# Patient Record
Sex: Female | Born: 1940 | Race: White | Hispanic: No | Marital: Married | Smoking: Never smoker
Health system: Southern US, Community
[De-identification: ages and names within clinical notes are randomized; demographics above are authoritative.]

## PROBLEM LIST (undated history)

## (undated) DIAGNOSIS — F329 Major depressive disorder, single episode, unspecified: Secondary | ICD-10-CM

## (undated) DIAGNOSIS — Z803 Family history of malignant neoplasm of breast: Secondary | ICD-10-CM

## (undated) DIAGNOSIS — M1990 Unspecified osteoarthritis, unspecified site: Secondary | ICD-10-CM

## (undated) DIAGNOSIS — I2510 Atherosclerotic heart disease of native coronary artery without angina pectoris: Secondary | ICD-10-CM

## (undated) DIAGNOSIS — R531 Weakness: Secondary | ICD-10-CM

## (undated) DIAGNOSIS — Z9889 Other specified postprocedural states: Secondary | ICD-10-CM

## (undated) DIAGNOSIS — I639 Cerebral infarction, unspecified: Secondary | ICD-10-CM

## (undated) DIAGNOSIS — R112 Nausea with vomiting, unspecified: Secondary | ICD-10-CM

## (undated) DIAGNOSIS — F32A Depression, unspecified: Secondary | ICD-10-CM

## (undated) DIAGNOSIS — I509 Heart failure, unspecified: Secondary | ICD-10-CM

## (undated) DIAGNOSIS — T4145XA Adverse effect of unspecified anesthetic, initial encounter: Secondary | ICD-10-CM

## (undated) DIAGNOSIS — G4730 Sleep apnea, unspecified: Secondary | ICD-10-CM

## (undated) DIAGNOSIS — R51 Headache: Secondary | ICD-10-CM

## (undated) DIAGNOSIS — K229 Disease of esophagus, unspecified: Secondary | ICD-10-CM

## (undated) DIAGNOSIS — Z8719 Personal history of other diseases of the digestive system: Secondary | ICD-10-CM

## (undated) DIAGNOSIS — E7800 Pure hypercholesterolemia, unspecified: Secondary | ICD-10-CM

## (undated) DIAGNOSIS — I4891 Unspecified atrial fibrillation: Secondary | ICD-10-CM

## (undated) DIAGNOSIS — G43909 Migraine, unspecified, not intractable, without status migrainosus: Secondary | ICD-10-CM

## (undated) DIAGNOSIS — E785 Hyperlipidemia, unspecified: Secondary | ICD-10-CM

## (undated) DIAGNOSIS — I10 Essential (primary) hypertension: Secondary | ICD-10-CM

## (undated) DIAGNOSIS — M797 Fibromyalgia: Secondary | ICD-10-CM

## (undated) DIAGNOSIS — I209 Angina pectoris, unspecified: Secondary | ICD-10-CM

## (undated) DIAGNOSIS — C801 Malignant (primary) neoplasm, unspecified: Secondary | ICD-10-CM

## (undated) DIAGNOSIS — K219 Gastro-esophageal reflux disease without esophagitis: Secondary | ICD-10-CM

## (undated) DIAGNOSIS — T8859XA Other complications of anesthesia, initial encounter: Secondary | ICD-10-CM

## (undated) DIAGNOSIS — K224 Dyskinesia of esophagus: Secondary | ICD-10-CM

## (undated) DIAGNOSIS — D229 Melanocytic nevi, unspecified: Secondary | ICD-10-CM

## (undated) DIAGNOSIS — C439 Malignant melanoma of skin, unspecified: Secondary | ICD-10-CM

## (undated) DIAGNOSIS — IMO0002 Reserved for concepts with insufficient information to code with codable children: Secondary | ICD-10-CM

## (undated) DIAGNOSIS — Z860100 Personal history of colon polyps, unspecified: Secondary | ICD-10-CM

## (undated) DIAGNOSIS — G2581 Restless legs syndrome: Secondary | ICD-10-CM

## (undated) DIAGNOSIS — M5430 Sciatica, unspecified side: Secondary | ICD-10-CM

## (undated) DIAGNOSIS — Z800 Family history of malignant neoplasm of digestive organs: Secondary | ICD-10-CM

## (undated) HISTORY — DX: Family history of malignant neoplasm of digestive organs: Z80.0

## (undated) HISTORY — DX: Personal history of colon polyps, unspecified: Z86.0100

## (undated) HISTORY — PX: DILATION AND CURETTAGE OF UTERUS: SHX78

## (undated) HISTORY — DX: Family history of malignant neoplasm of breast: Z80.3

## (undated) HISTORY — PX: NASAL SEPTUM SURGERY: SHX37

## (undated) HISTORY — PX: UPPER GASTROINTESTINAL ENDOSCOPY: SHX188

## (undated) HISTORY — PX: CATARACT EXTRACTION, BILATERAL: SHX1313

## (undated) HISTORY — DX: Melanocytic nevi, unspecified: D22.9

## (undated) HISTORY — DX: Personal history of colonic polyps: Z86.010

## (undated) HISTORY — DX: Disease of esophagus, unspecified: K22.9

## (undated) HISTORY — DX: Dyskinesia of esophagus: K22.4

## (undated) HISTORY — DX: Malignant melanoma of skin, unspecified: C43.9

## (undated) HISTORY — PX: FRACTURE SURGERY: SHX138

---
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
## 2002-11-13 HISTORY — PX: MOUTH SURGERY: SHX715

## 2003-11-14 HISTORY — PX: CORONARY ARTERY BYPASS GRAFT: SHX141

## 2006-07-25 ENCOUNTER — Ambulatory Visit: Payer: Self-pay | Admitting: Family Medicine

## 2007-11-14 ENCOUNTER — Emergency Department (HOSPITAL_COMMUNITY): Admission: EM | Admit: 2007-11-14 | Discharge: 2007-11-14 | Payer: Self-pay | Admitting: Emergency Medicine

## 2007-11-27 ENCOUNTER — Ambulatory Visit: Payer: Self-pay | Admitting: Family Medicine

## 2008-03-17 ENCOUNTER — Ambulatory Visit: Payer: Self-pay | Admitting: Family Medicine

## 2009-01-27 ENCOUNTER — Ambulatory Visit: Payer: Self-pay | Admitting: Family Medicine

## 2009-02-22 ENCOUNTER — Encounter: Payer: Self-pay | Admitting: Family Medicine

## 2009-03-13 ENCOUNTER — Encounter: Payer: Self-pay | Admitting: Family Medicine

## 2009-04-13 ENCOUNTER — Encounter: Payer: Self-pay | Admitting: Family Medicine

## 2010-03-09 ENCOUNTER — Ambulatory Visit: Payer: Self-pay

## 2011-03-02 ENCOUNTER — Inpatient Hospital Stay (HOSPITAL_COMMUNITY)
Admission: RE | Admit: 2011-03-02 | Discharge: 2011-03-02 | Disposition: A | Discharge status: Home / Self Care | Payer: Medicare Other | Source: Ambulatory Visit | Attending: Cardiology | Admitting: Cardiology

## 2011-03-02 ENCOUNTER — Ambulatory Visit (HOSPITAL_COMMUNITY)
Admission: RE | Admit: 2011-03-02 | Discharge: 2011-03-02 | Disposition: A | Discharge status: Home / Self Care | Payer: Medicare Other | Source: Ambulatory Visit | Attending: Cardiology | Admitting: Cardiology

## 2011-03-02 DIAGNOSIS — I25810 Atherosclerosis of coronary artery bypass graft(s) without angina pectoris: Secondary | ICD-10-CM | POA: Insufficient documentation

## 2011-03-02 DIAGNOSIS — Z01810 Encounter for preprocedural cardiovascular examination: Secondary | ICD-10-CM | POA: Insufficient documentation

## 2011-03-02 DIAGNOSIS — I2510 Atherosclerotic heart disease of native coronary artery without angina pectoris: Secondary | ICD-10-CM | POA: Insufficient documentation

## 2011-03-02 DIAGNOSIS — I200 Unstable angina: Secondary | ICD-10-CM | POA: Insufficient documentation

## 2011-03-02 LAB — GLUCOSE, CAPILLARY: Glucose-Capillary: 137 mg/dL — ABNORMAL HIGH (ref 70–99)

## 2011-03-04 NOTE — Cardiovascular Report (Signed)
Morgan [name], Morgan [name]             ACCOUNT NO.:  [id number]  MEDICAL RECORD NO.:  [id number]           PATIENT TYPE:  O  LOCATION:  CATH                         FACILITY:  [facility]  PHYSICIAN:  Jake Bathe, MD      DATE OF BIRTH:  Nov 01, 1941  DATE OF PROCEDURE:  03/02/2011 DATE OF DISCHARGE:                           CARDIAC CATHETERIZATION   PRIMARY CARE PHYSICIAN:  Dr. Clarice Pole in [place], [place].  CARDIOTHORACIC SURGEON:  Dr. Pat Patrick at [facility] in [place].  INDICATIONS:  A 70 year old female with coronary artery disease status post bypass x2, LIMA to LAD, and SVG to OM in 2006 by Dr. Pat Patrick.  He has been having progressive anginal symptoms and has failed both Ranexa as well as isosorbide.  A nuclear stress test was performed, which demonstrated mild reversibility in the distal anterior wall segment with overall normal ejection fraction.  PROCEDURE DETAILS:  Informed consent was obtained.  Risk of stroke, heart attack, death, renal impairment, bleeding, arterial damage were explained to the patient at length.  Due to her BUN of 41 and creatinine of 1.35, she was brought in early for bicarbonate protocol.  She tolerated this well.  Fluoroscopy of the femoral head was obtained.  1% lidocaine was used for local anesthesia and a 4-French sheath was inserted in the right femoral artery without difficulty.  A Judkins left #4 catheter was used to selectively cannulate the left main artery and multiple views of hand injection of Omnipaque were obtained.  This catheter was then exchanged for a no torque Williams right catheter, which was used to selectively cannulate the right coronary artery and multiple views with hand injection of Omnipaque were obtained.  This catheter was then utilized to selectively cannulate the SVG graft and a view with Omnipaque was obtained.  The left subclavian artery was then selectively cannulated  and a wire was used to traverse past the LIMA. The no torque Williams right did not selectively cannulate the LIMA well enough and therefore this was exchanged over an exchange length wire for a LIMA catheter, which was appropriately selective.  Two views with hand injection of Omnipaque were then obtained.  An angled pigtail was used to then cross the aortic valve and hemodynamics were obtained.  Due to contrast-sparing hand injection, left ventriculogram was performed with approximately 5 mL of contrast.  The left ventricle was underfilled. Recent echocardiogram evidence was normal ejection fraction.  FINDINGS: 1. Left main artery widely patent giving rise to both the circumflex     and the LAD.  No angiographically significant disease. 2. Left anterior descending artery - the proximal portion of this     vessel remains widely patent.  There is a small caliber diagonal     branch, which is possibly a 2-mm vessel, which demonstrates a 95%     stenosis encompassing approximately 10 mm in length.  This is     likely the culprit vessel for the nuclear stress test abnormality.     This vessel was present previously and was not bypassed due to the     small  caliber nature of the vessel.  The remainder of the LAD after     this diagonal branch is quite small in caliber and the LIMA to LAD     graft in the mid segment remains widely patent providing     competitive flow down the vessel.  After the bifurcation of the     first diagonal branch with the noted stenosis, the LAD narrows to     approximately 50%.  The proximal vessel is calcified. 3. Circumflex artery - compared to the LAD distribution distally this     is a moderate-sized vessel providing 2 obtuse marginal branches.     Just prior to the first obtuse marginal branch, there is     approximately 30% stenosis and there is also a 30% stenosis between     the first and second obtuse marginal branch in the AV groove     circumflex. 4.  Right coronary artery - there is a mid 30% focal stenosis.  This     vessel gives rise to the posterior descending artery and therefore     is dominant.  There are 2 posterolateral branches.  Bypass grafts,     the SVG to obtuse marginal is occluded proximally.  The LIMA to the     LAD is a small-caliber vessel matching the distal portion after the     anastomosis site in the LAD. 5. Hemodynamics.  Aortic pressure 148/62 with a mean of 95.  Left     ventricular pressure 148 with an end-diastolic pressure of 20 mmHg.     There is no gradient during pullback.  Hand injection of Omnipaque     underfilled the left ventricle during left ventriculogram.  Recent     echocardiogram does demonstrate normal ejection fraction.  IMPRESSION: 1. Occluded saphenous vein graft to obtuse marginal. 2. Widely patent left internal mammary artery to left anterior     descending graft, although small in caliber. 3. 85% to 90% stenosis in the first diagonal branch likely     contributing to her anginal symptoms as well as nuclear stress test     abnormality. 4. Minor 30% stenosis in the obtuse marginal branch sequential as well     as the mid right coronary artery. 5. Left ventricular ejection fraction by echocardiogram recently was     normal at 60%.  Underfilled hand injection during left     ventriculography.  PLAN:  Findings have been discussed with Dr. Everette Rank of Interventional Cardiology.  Given her refractory symptoms, next week he will attempt percutaneous intervention to this diagonal vessel.  At the least, a cutting balloon procedure will be performed and if the vessel appears large enough perhaps a small-caliber stent will be able to be placed.  She does note that she has trigger finger bilaterally and was considering trigger finger release surgery in the future.  She does note that this could be postponed however.     Jake Bathe, MD     MCS/MEDQ  D:  03/02/2011  T:  03/03/2011   Job:  [id number]  cc:   Clarice Pole, MD Pat Patrick, MD  Electronically Signed by Donato Schultz MD on 03/04/2011 06:40:29 AM

## 2011-03-08 ENCOUNTER — Ambulatory Visit (HOSPITAL_COMMUNITY)
Admission: RE | Admit: 2011-03-08 | Discharge: 2011-03-09 | Disposition: A | Discharge status: Home / Self Care | Payer: Medicare Other | Source: Ambulatory Visit | Attending: Interventional Cardiology | Admitting: Interventional Cardiology

## 2011-03-08 DIAGNOSIS — I2510 Atherosclerotic heart disease of native coronary artery without angina pectoris: Secondary | ICD-10-CM | POA: Insufficient documentation

## 2011-03-08 DIAGNOSIS — I209 Angina pectoris, unspecified: Secondary | ICD-10-CM | POA: Insufficient documentation

## 2011-03-08 DIAGNOSIS — Z951 Presence of aortocoronary bypass graft: Secondary | ICD-10-CM | POA: Insufficient documentation

## 2011-03-08 DIAGNOSIS — E785 Hyperlipidemia, unspecified: Secondary | ICD-10-CM | POA: Insufficient documentation

## 2011-03-08 LAB — GLUCOSE, CAPILLARY: Glucose-Capillary: 138 mg/dL — ABNORMAL HIGH (ref 70–99)

## 2011-03-08 LAB — POCT ACTIVATED CLOTTING TIME: Activated Clotting Time: 370 seconds

## 2011-03-09 LAB — BASIC METABOLIC PANEL
CO2: 30 mEq/L (ref 19–32)
Calcium: 9.4 mg/dL (ref 8.4–10.5)
Creatinine, Ser: 0.97 mg/dL (ref 0.4–1.2)
Sodium: 146 mEq/L — ABNORMAL HIGH (ref 135–145)

## 2011-03-09 LAB — CBC
MCH: 28.9 pg (ref 26.0–34.0)
MCV: 89.8 fL (ref 78.0–100.0)
Platelets: 247 10*3/uL (ref 150–400)
RBC: 3.43 MIL/uL — ABNORMAL LOW (ref 3.87–5.11)
RDW: 13.4 % (ref 11.5–15.5)
WBC: 6.6 10*3/uL (ref 4.0–10.5)

## 2011-03-16 NOTE — Cardiovascular Report (Signed)
  NAMEMERRELL, RETTINGER             ACCOUNT NO.:  [id number]  MEDICAL RECORD NO.:  [id number]           PATIENT TYPE:  O  LOCATION:  6524                         FACILITY:  [facility]  PHYSICIAN:  Corky Crafts, MDDATE OF BIRTH:  Apr 07, 1941  DATE OF PROCEDURE:  03/08/2011 DATE OF DISCHARGE:                           CARDIAC CATHETERIZATION   PRIMARY CARDIOLOGIST:  Loraine Leriche C. Anne Fu, MD  PROCEDURE PERFORMED:  PCI of the first diagonal.  OPERATOR:  Corky Crafts, MD  INDICATIONS:  Stable angina.  PROCEDURE NARRATIVE:  The risks and benefits of cardiac cath were explained to the patient.  Informed consent was obtained.  She was brought to the cath lab.  She was prepped and draped in usual sterile fashion.  Her right wrist was infiltrated with 1% lidocaine.  A 6-French glide sheath was placed into the right radial artery using the modified Seldinger technique.  Verapamil was used in the radial artery to prevent spasm.  A CLS 3.0 guiding catheter was advanced to the ascending aorta and used to engage the left main.  Angiomax was used for anticoagulation.  An ACT was withdrawn to document therapeutic effect. Prowater wire was placed across the lesion.  A 1.5 x 15 balloon was used to attempt to predilate the lesion, inflate it to 10 atmospheres; however, there was no significant change.  Subsequently, a 2.0 x 15 MINI TREK was inflated up to 18 atmospheres.  A waist in the mid lesion remained.  Subsequently, a 2.0 x 12 Perry TREK was used and successfully predilated the lesion in the first diagonal.  This had been documented by diagnostic cath by Dr. Anne Fu.  There was a 90% moderate length lesion.  After adequate predilatation, a 2.25 x 18 Resolute stent was deployed at 10 atmospheres for 22 seconds, it was postdilated with 2.25 x 80 [place] TREK balloon inflated to 12 atmospheres and then to 14 atmospheres.  There was no residual stenosis.  TIMI 3 flow was maintained.  IMPRESSION:   Multiple doses of intracoronary nitroglycerin were given to try and dilate the vessel, which was successful.  There was some evidence of spasm proximal to the stent when compared to the initial angiogram.  This was likely due to manipulation with the balloons, wires, and stent.  IMPRESSION:  Successful percutaneous coronary intervention of the first diagonal with a small drug-eluting stent.  Spasm noted in the proximal diagonal as noted above.  RECOMMENDATIONS:  Continue aspirin and Plavix for at least a year. Continue aggressive secondary prevention.     Corky Crafts, MD     JSV/MEDQ  D:  03/08/2011  T:  03/08/2011  Job:  [id number]  cc:   Jake Bathe, MD Ladene Artist, M.D.  Electronically Signed by Lance Muss MD on 03/16/2011 01:32:57 PM

## 2011-03-23 NOTE — Discharge Summary (Signed)
  [name], [name]             ACCOUNT NO.:  [id number]  MEDICAL RECORD NO.:  [id number]           PATIENT TYPE:  O  LOCATION:  6524                         FACILITY:  [facility]  PHYSICIAN:  Corky Crafts, MDDATE OF BIRTH:  [birth year]  DATE OF ADMISSION:  03/08/2011 DATE OF DISCHARGE:  03/09/2011                              DISCHARGE SUMMARY   DISCHARGE DIAGNOSES: 1. Coronary artery disease. 2. Hyperlipidemia.  PROCEDURES PERFORMED:  Cardiac catheterization with drug-eluting stent placement to the first diagonal.  HOSPITAL COURSE:  The patient underwent cardiac catheterization from the right radial approach and had a drug-eluting stent placed in her first diagonal.  This vessel has been blocked for some time but was too small to be bypassed when she had bypass surgery several years ago.  She had an abnormal stress test and persistent angina.  She had been intolerant to the multiple antianginals.  She tolerated the procedure well.  Her hemoglobin was 9.9 on the day after the procedure.  Creatinine prior to the procedure was 10.9.  She was aggressively hydrated postprocedure because of some prior renal insufficiency.  She had a short run of nonsustained ventricular tachycardia but was asymptomatic.  Her electrolytes were normal.  She had no significant chest discomfort when walking with Cardiac Rehab.  She was deemed ready for discharge the following day.  Her right radial pulse was intact.  ACTIVITY:  Increase activity slowly.  Follow post-radial cath instructions.  DIET:  Low-sodium, heart-healthy diet.  FOLLOWUP APPOINTMENT:  With Dr. Anne Fu on Mar 23, 2011, at 9:15 a.m.  DISCHARGE MEDICATIONS: 1. Tylenol 650 mg p.o. q.4 hours p.r.n. 2. Protonix 40 mg daily. 3. Aspirin 81 mg daily. 4. Calcium with vitamin D. 5. Clonazepam 1-1/2 tablet at bedtime. 6. Cozaar 100 mg at bedtime. 7. Effexor 75 mg daily. 8. HCTZ 25 mg daily. 9. Lipitor 80 mg  daily. 10.Ocuvite. 11.Fish oil. 12.Plavix 75 mg daily. 13.Toprol-XL 100 mg at bedtime. 14.Vitamin D. 15.She is to stop taking Nexium and diclofenac because of the possible     interactions with Plavix.     Corky Crafts, MD     JSV/MEDQ  D:  03/09/2011  T:  03/09/2011  Job:  [id number]  cc:   Jake Bathe, MD Shella Spearing, MD Yisroel Ramming, MD  Electronically Signed by Lance Muss MD on 03/23/2011 12:53:10 PM

## 2011-06-14 HISTORY — PX: CORONARY ANGIOPLASTY WITH STENT PLACEMENT: SHX49

## 2011-09-27 ENCOUNTER — Encounter: Payer: Self-pay | Admitting: *Deleted

## 2011-09-27 ENCOUNTER — Observation Stay (HOSPITAL_COMMUNITY)
Admission: EM | Admit: 2011-09-27 | Discharge: 2011-09-29 | Disposition: A | Discharge status: Home / Self Care | Payer: Medicare Other | Attending: Internal Medicine | Admitting: Internal Medicine

## 2011-09-27 ENCOUNTER — Emergency Department (HOSPITAL_COMMUNITY): Payer: Medicare Other

## 2011-09-27 DIAGNOSIS — I152 Hypertension secondary to endocrine disorders: Secondary | ICD-10-CM | POA: Diagnosis present

## 2011-09-27 DIAGNOSIS — E119 Type 2 diabetes mellitus without complications: Secondary | ICD-10-CM | POA: Insufficient documentation

## 2011-09-27 DIAGNOSIS — E785 Hyperlipidemia, unspecified: Secondary | ICD-10-CM

## 2011-09-27 DIAGNOSIS — I480 Paroxysmal atrial fibrillation: Secondary | ICD-10-CM | POA: Diagnosis present

## 2011-09-27 DIAGNOSIS — R079 Chest pain, unspecified: Principal | ICD-10-CM | POA: Diagnosis present

## 2011-09-27 DIAGNOSIS — Z9861 Coronary angioplasty status: Secondary | ICD-10-CM | POA: Insufficient documentation

## 2011-09-27 DIAGNOSIS — I2510 Atherosclerotic heart disease of native coronary artery without angina pectoris: Secondary | ICD-10-CM | POA: Diagnosis present

## 2011-09-27 DIAGNOSIS — R110 Nausea: Secondary | ICD-10-CM | POA: Insufficient documentation

## 2011-09-27 DIAGNOSIS — R197 Diarrhea, unspecified: Secondary | ICD-10-CM | POA: Diagnosis present

## 2011-09-27 DIAGNOSIS — I10 Essential (primary) hypertension: Secondary | ICD-10-CM

## 2011-09-27 HISTORY — DX: Unspecified atrial fibrillation: I48.91

## 2011-09-27 HISTORY — DX: Sleep apnea, unspecified: G47.30

## 2011-09-27 HISTORY — DX: Major depressive disorder, single episode, unspecified: F32.9

## 2011-09-27 HISTORY — DX: Hyperlipidemia, unspecified: E78.5

## 2011-09-27 HISTORY — DX: Fibromyalgia: M79.7

## 2011-09-27 HISTORY — DX: Depression, unspecified: F32.A

## 2011-09-27 HISTORY — DX: Migraine, unspecified, not intractable, without status migrainosus: G43.909

## 2011-09-27 HISTORY — DX: Atherosclerotic heart disease of native coronary artery without angina pectoris: I25.10

## 2011-09-27 HISTORY — DX: Essential (primary) hypertension: I10

## 2011-09-27 HISTORY — DX: Weakness: R53.1

## 2011-09-27 HISTORY — DX: Restless legs syndrome: G25.81

## 2011-09-27 HISTORY — DX: Sciatica, unspecified side: M54.30

## 2011-09-27 HISTORY — DX: Pure hypercholesterolemia, unspecified: E78.00

## 2011-09-27 HISTORY — DX: Angina pectoris, unspecified: I20.9

## 2011-09-27 HISTORY — DX: Headache: R51

## 2011-09-27 LAB — CBC
MCV: 88.5 fL (ref 78.0–100.0)
Platelets: 229 10*3/uL (ref 150–400)
RBC: 4.51 MIL/uL (ref 3.87–5.11)
RDW: 13.7 % (ref 11.5–15.5)
WBC: 5.3 10*3/uL (ref 4.0–10.5)

## 2011-09-27 LAB — COMPREHENSIVE METABOLIC PANEL
ALT: 26 U/L (ref 0–35)
AST: 28 U/L (ref 0–37)
Alkaline Phosphatase: 91 U/L (ref 39–117)
CO2: 29 mEq/L (ref 19–32)
Calcium: 10 mg/dL (ref 8.4–10.5)
Chloride: 99 mEq/L (ref 96–112)
GFR calc Af Amer: 75 mL/min — ABNORMAL LOW (ref >90)
GFR calc non Af Amer: 65 mL/min — ABNORMAL LOW (ref >90)
Glucose, Bld: 108 mg/dL — ABNORMAL HIGH (ref 70–99)
Potassium: 3.2 mEq/L — ABNORMAL LOW (ref 3.5–5.1)
Sodium: 138 mEq/L (ref 135–145)
Total Bilirubin: 0.3 mg/dL (ref 0.3–1.2)

## 2011-09-27 LAB — DIFFERENTIAL
Basophils Absolute: 0 10*3/uL (ref 0.0–0.1)
Lymphocytes Relative: 30 % (ref 12–46)
Lymphs Abs: 1.6 10*3/uL (ref 0.7–4.0)
Neutro Abs: 2.8 10*3/uL (ref 1.7–7.7)
Neutrophils Relative %: 52 % (ref 43–77)

## 2011-09-27 LAB — POCT I-STAT TROPONIN I

## 2011-09-27 MED ORDER — VENLAFAXINE HCL 75 MG PO TABS
75.0000 mg | ORAL_TABLET | Freq: Two times a day (BID) | ORAL | Status: DC
  Administered 2011-09-28 – 2011-09-29 (×2): 75 mg via ORAL
  Filled 2011-09-27 (×5): qty 1

## 2011-09-27 MED ORDER — ACETAMINOPHEN 325 MG PO TABS
650.0000 mg | ORAL_TABLET | Freq: Four times a day (QID) | ORAL | Status: DC | PRN
  Administered 2011-09-27 – 2011-09-28 (×3): 650 mg via ORAL
  Filled 2011-09-27 (×3): qty 2

## 2011-09-27 MED ORDER — OMEGA-3-ACID ETHYL ESTERS 1 G PO CAPS
4.0000 g | ORAL_CAPSULE | Freq: Every day | ORAL | Status: DC
  Filled 2011-09-27: qty 4

## 2011-09-27 MED ORDER — GABAPENTIN 300 MG PO CAPS
300.0000 mg | ORAL_CAPSULE | Freq: Every day | ORAL | Status: DC
  Administered 2011-09-28 (×2): 300 mg via ORAL
  Filled 2011-09-27 (×3): qty 1

## 2011-09-27 MED ORDER — CLOPIDOGREL BISULFATE 75 MG PO TABS
75.0000 mg | ORAL_TABLET | Freq: Every day | ORAL | Status: DC
  Administered 2011-09-29: 75 mg via ORAL
  Filled 2011-09-27 (×3): qty 1

## 2011-09-27 MED ORDER — NITROGLYCERIN 0.4 MG SL SUBL: 0.4000 mg | SUBLINGUAL_TABLET | SUBLINGUAL | Status: DC | PRN

## 2011-09-27 MED ORDER — ACETAMINOPHEN 650 MG RE SUPP: 650.0000 mg | Freq: Four times a day (QID) | RECTAL | Status: DC | PRN

## 2011-09-27 MED ORDER — ENOXAPARIN SODIUM 40 MG/0.4ML ~~LOC~~ SOLN
40.0000 mg | SUBCUTANEOUS | Status: DC
  Administered 2011-09-27 – 2011-09-28 (×2): 40 mg via SUBCUTANEOUS
  Filled 2011-09-27 (×3): qty 0.4

## 2011-09-27 MED ORDER — CLONAZEPAM 1 MG PO TABS
1.5000 mg | ORAL_TABLET | Freq: Every day | ORAL | Status: DC
  Administered 2011-09-28 (×2): 1.5 mg via ORAL
  Filled 2011-09-27: qty 1
  Filled 2011-09-27: qty 2
  Filled 2011-09-27 (×2): qty 3

## 2011-09-27 MED ORDER — PROMETHAZINE HCL 25 MG PO TABS: 12.5000 mg | ORAL_TABLET | Freq: Four times a day (QID) | ORAL | Status: DC | PRN

## 2011-09-27 MED ORDER — MORPHINE SULFATE 2 MG/ML IJ SOLN: 1.0000 mg | INTRAMUSCULAR | Status: DC | PRN

## 2011-09-27 MED ORDER — ONDANSETRON HCL 4 MG/2ML IJ SOLN
4.0000 mg | Freq: Once | INTRAMUSCULAR | Status: AC
  Administered 2011-09-27: 4 mg via INTRAVENOUS
  Filled 2011-09-27: qty 2

## 2011-09-27 MED ORDER — SODIUM CHLORIDE 0.9 % IV SOLN
INTRAVENOUS | Status: DC
  Administered 2011-09-28: 03:00:00 via INTRAVENOUS

## 2011-09-27 MED ORDER — VITAMIN D3 25 MCG (1000 UNIT) PO TABS
2000.0000 [IU] | ORAL_TABLET | Freq: Every day | ORAL | Status: DC
  Administered 2011-09-28 – 2011-09-29 (×2): 2000 [IU] via ORAL
  Filled 2011-09-27 (×3): qty 2

## 2011-09-27 MED ORDER — MORPHINE SULFATE 2 MG/ML IJ SOLN
2.0000 mg | Freq: Once | INTRAMUSCULAR | Status: AC
  Administered 2011-09-27: 2 mg via INTRAVENOUS
  Filled 2011-09-27: qty 1

## 2011-09-27 MED ORDER — PROMETHAZINE HCL 25 MG/ML IJ SOLN: 12.5000 mg | Freq: Four times a day (QID) | INTRAMUSCULAR | Status: DC | PRN

## 2011-09-27 MED ORDER — ALBUTEROL SULFATE (5 MG/ML) 0.5% IN NEBU: 2.5000 mg | INHALATION_SOLUTION | RESPIRATORY_TRACT | Status: DC | PRN

## 2011-09-27 MED ORDER — SODIUM CHLORIDE 0.9 % IV SOLN
INTRAVENOUS | Status: AC
  Administered 2011-09-27: 17:00:00 via INTRAVENOUS

## 2011-09-27 MED ORDER — ROSUVASTATIN CALCIUM 40 MG PO TABS
40.0000 mg | ORAL_TABLET | Freq: Every day | ORAL | Status: DC
  Administered 2011-09-28 – 2011-09-29 (×2): 40 mg via ORAL
  Filled 2011-09-27 (×3): qty 1

## 2011-09-27 MED ORDER — ONDANSETRON HCL 4 MG PO TABS: 4.0000 mg | ORAL_TABLET | Freq: Four times a day (QID) | ORAL | Status: DC | PRN

## 2011-09-27 MED ORDER — ASPIRIN EC 81 MG PO TBEC
81.0000 mg | DELAYED_RELEASE_TABLET | Freq: Every day | ORAL | Status: DC
  Administered 2011-09-28 – 2011-09-29 (×2): 81 mg via ORAL
  Filled 2011-09-27 (×3): qty 1

## 2011-09-27 MED ORDER — SODIUM CHLORIDE 0.9 % IV BOLUS (SEPSIS)
1000.0000 mL | Freq: Once | INTRAVENOUS | Status: AC
  Administered 2011-09-27: 1000 mL via INTRAVENOUS

## 2011-09-27 MED ORDER — HYDROCODONE-ACETAMINOPHEN 5-325 MG PO TABS: 1.0000 | ORAL_TABLET | ORAL | Status: DC | PRN

## 2011-09-27 MED ORDER — METOPROLOL SUCCINATE ER 25 MG PO TB24
25.0000 mg | ORAL_TABLET | Freq: Every day | ORAL | Status: DC
  Administered 2011-09-29: 25 mg via ORAL
  Filled 2011-09-27 (×2): qty 1

## 2011-09-27 MED ORDER — PANTOPRAZOLE SODIUM 40 MG PO TBEC
40.0000 mg | DELAYED_RELEASE_TABLET | Freq: Every day | ORAL | Status: DC
  Administered 2011-09-28 – 2011-09-29 (×3): 40 mg via ORAL
  Filled 2011-09-27 (×4): qty 1

## 2011-09-27 MED ORDER — ONDANSETRON HCL 4 MG/2ML IJ SOLN: 4.0000 mg | Freq: Four times a day (QID) | INTRAMUSCULAR | Status: DC | PRN

## 2011-09-27 MED ORDER — ALUM & MAG HYDROXIDE-SIMETH 200-200-20 MG/5ML PO SUSP
30.0000 mL | Freq: Four times a day (QID) | ORAL | Status: DC | PRN
  Filled 2011-09-27: qty 30

## 2011-09-27 NOTE — ED Notes (Signed)
Nurse currently assisting another patient another nurse will call back for report

## 2011-09-27 NOTE — ED Provider Notes (Signed)
History     CSN: [id number] Arrival date & time: 09/27/2011 11:29 AM   First MD Initiated Contact with Patient 09/27/11 1449      Chief Complaint  Patient presents with  . Chest Pain  . Diarrhea    (Consider location/radiation/quality/duration/timing/severity/associated sxs/prior treatment) Patient is a 70 y.o. female presenting with chest pain. The history is provided by the patient.  Chest Pain The chest pain began 3 - 5 days ago. Episode frequency: waxes and wanes. The chest pain is unchanged. The pain is associated with exertion. The pain is currently at 5/10. The severity of the pain is moderate. The quality of the pain is described as pressure-like. The pain does not radiate. Chest pain is worsened by certain positions. Pertinent negatives for primary symptoms include no fever, no shortness of breath, no cough, no abdominal pain, no nausea, no vomiting, no dizziness and no altered mental status. Primary symptoms comment: diarrhea  Pertinent negatives for associated symptoms include no claudication, no diaphoresis and no orthopnea. She tried nothing for the symptoms. Risk factors include no known risk factors.     Past Medical History  Diagnosis Date  . Coronary artery disease   . Hypertension   . High cholesterol   . Atrial fibrillation   . Diabetes mellitus     Past Surgical History  Procedure Date  . Cardiac surgery   . Coronary angioplasty with stent placement     History reviewed. No pertinent family history.  History  Substance Use Topics  . Smoking status: Never Smoker   . Smokeless tobacco: Not on file  . Alcohol Use: No    OB History    Grav Para Term Preterm Abortions TAB SAB Ect Mult Living                  Review of Systems  Constitutional: Negative for fever and diaphoresis.  Respiratory: Negative for cough and shortness of breath.   Cardiovascular: Positive for chest pain. Negative for orthopnea and claudication.  Gastrointestinal: Negative  for nausea, vomiting and abdominal pain.  Neurological: Negative for dizziness.  Psychiatric/Behavioral: Negative for altered mental status.  All other systems reviewed and are negative.    Allergies  Percocet and Sulfa antibiotics  Home Medications   Current Outpatient Rx  Name Route Sig Dispense Refill  . AMITRIPTYLINE HCL 10 MG PO TABS Oral Take 30 mg by mouth at bedtime.      . ASPIRIN EC 81 MG PO TBEC Oral Take 81 mg by mouth daily.      . ATORVASTATIN CALCIUM 80 MG PO TABS Oral Take 80 mg by mouth daily.      [name] CALCIUM-VITAMIN D 500-400 MG-UNIT PO TABS Oral Take 1 tablet by mouth 2 (two) times daily.      [name] VITAMIN D 1000 UNITS PO TABS Oral Take 2,000 Units by mouth daily.      [name] CLONAZEPAM 1 MG PO TABS Oral Take 1.5 mg by mouth daily.      [name] CLOPIDOGREL BISULFATE 75 MG PO TABS Oral Take 75 mg by mouth daily.      [name] PRESERVISION AREDS PO Oral Take 1 tablet by mouth 2 (two) times daily.      . OMEGA-3-ACID ETHYL ESTERS 1 G PO CAPS Oral Take 4 g by mouth daily.      [name] PANTOPRAZOLE SODIUM 40 MG PO TBEC Oral Take 40 mg by mouth daily.      [name] M-VIT PO Oral Take 1 tablet by  mouth daily.      . VENLAFAXINE HCL 37.5 MG PO TABS Oral Take 75 mg by mouth 2 (two) times daily.      [name] NITROGLYCERIN 0.4 MG SL SUBL Sublingual Place 0.4 mg under the tongue every 5 (five) minutes as needed. For chest pain.       BP 118/60  Pulse 66  Temp(Src) 97.7 F (36.5 C) (Oral)  Resp 18  SpO2 95%  Physical Exam  Nursing note and vitals reviewed. Constitutional: She is oriented to person, place, and time. She appears well-developed and well-nourished. No distress.  HENT:  Head: Normocephalic and atraumatic.  Eyes: Conjunctivae are normal. Pupils are equal, round, and reactive to light.  Neck: Normal range of motion. Neck supple.  Cardiovascular: Normal rate, regular rhythm and normal heart sounds.   Pulmonary/Chest: Effort normal and breath sounds normal. No respiratory distress.  Abdominal:  Soft. She exhibits no distension. There is no tenderness. There is no rebound.  Musculoskeletal: Normal range of motion. She exhibits no edema and no tenderness.  Neurological: She is alert and oriented to person, place, and time. No cranial nerve deficit. Coordination normal.  Skin: Skin is warm and dry. No erythema.    ED Course  Procedures (including critical care time)  Labs Reviewed  DIFFERENTIAL - Abnormal; Notable for the following:    Monocytes Relative 16 (*)    All other components within normal limits  COMPREHENSIVE METABOLIC PANEL - Abnormal; Notable for the following:    Potassium 3.2 (*)    Glucose, Bld 108 (*)    Albumin 3.4 (*)    GFR calc non Af Amer 65 (*)    GFR calc Af Amer 75 (*)    All other components within normal limits  CBC  POCT I-STAT TROPONIN I  I-STAT TROPONIN I  STOOL CULTURE  TROPONIN I  TROPONIN I  TROPONIN I   Dg Abd Acute W/chest  09/27/2011  *RADIOLOGY REPORT*  Clinical Data: Abdominal pain and discomfort  ACUTE ABDOMEN SERIES (ABDOMEN 2 VIEW & CHEST 1 VIEW)  Comparison: None  Findings: Sternotomy wires overlie normal heart silhouette.  There is chronic bronchitic markings centrally.  No free air beneath hemidiaphragm.  No consolidation.  No dilated loops of large or small bowel.  No pathologic calcifications.  No aggressive osseous lesions.  IMPRESSION:  1.  No acute cardiopulmonary findings.  2. No evidence of bowel obstruction or free air.  Original Report Authenticated By: Genevive Bi, M.D.     1. Chest pain   2. Diarrhea      Date: 09/27/2011  Rate: 71  Rhythm: normal sinus rhythm  QRS Axis: normal  Intervals: normal  ST/T Wave abnormalities: nonspecific ST/T changes  Conduction Disutrbances:none  Narrative Interpretation:   Old EKG Reviewed: unchanged    MDM  Pt presented hx of cardiac disease with diarrhea and chest pain.  Reports nausea, no vomitting, well appearing otherwise.  CP described as pressure across chest, no  radiation.  EKG unchanged.  Labs neg.  Consulted [facility]ad for r/o and further care for dehydration/diarrhea.  They will admit pt.          Nena Alexander, MD 09/27/11 [id number]

## 2011-09-27 NOTE — ED Provider Notes (Signed)
Medical screening examination/treatment/procedure(s) were conducted as a shared visit with non-physician practitioner(s) and myself.  I personally evaluated the patient during the encounter  Exposed to children with gastroenteritis. Has had 15 episodes of diarrhea each of the past 2 days. Nausea but no vomiting. Developed substernal chest pain radiating across her chest today. Has no associated shortness of breath, diaphoresis, vomiting. Had a stent placed 5 weeks ago.  Regular rate and rhythm. No murmurs, rubs, gallops. Lungs clear to auscultation bilaterally. Abdomen soft, nontender, nondistended.  will require admission for Chest pain rule out and hydration and symptom control  Dayton Bailiff, MD 09/27/11 2341

## 2011-09-27 NOTE — H&P (Signed)
PATIENT DETAILS Name: Morgan [name] Age: 70 y.o. Sex: female Date of Birth: Oct 04, 1941 Admit Date: 09/27/2011 PCP:No primary provider on file.    Chief Complaint:  Diarrhea since Sunday evening Chest pain since last Monday  HPI: Patient is a 70 year old female who has a history of coronary artery disease status post CABG and more recently a PTCA, who comes in with the above-noted complaints. One of the patient's grand kids had diarrheal illness last week, beginning Sunday evening patient started having loose but profuse watery diarrhea. She denies any blood or mucus in stools She denies any fever. She claims she has been 15 episodes a day. There is no vomiting. She denies any abdominal pain however claims that abdomen is just sore. Beginning Monday she also claims that she started having bilateral chest pain going to her bilateral arms, pain was described as sharp, 5/10 at its worst. No associated nausea or vomiting. No associated shortness of breath or palpitations. Patient decided to finally presented to the ED because these above-noted symptoms were persistent. She has not been hospitalized recently she used any antibiotics over the past few months. She does claim to have mild generalized headaches. No history of neck pain.   Allergies:   Allergies  Allergen Reactions  . Percocet (Oxycodone-Acetaminophen) Other (See Comments)    hallucination  . Sulfa Antibiotics Other (See Comments)    hallucinations    Past Medical History: Past Medical History  Diagnosis Date  . Coronary artery disease   . Hypertension   . High cholesterol   . Atrial fibrillation   . Diabetes mellitus     Past Surgical History: Past Surgical History  Procedure Date  . Cardiac surgery   . Coronary angioplasty with stent placement     Medications: Prior to Admission medications   Medication Sig Start Date End Date Taking? Authorizing Provider  amitriptyline (ELAVIL) 10 MG tablet Take 30 mg by  mouth at bedtime.     Yes Historical Provider, MD  aspirin EC 81 MG tablet Take 81 mg by mouth daily.     Yes Historical Provider, MD  atorvastatin (LIPITOR) 80 MG tablet Take 80 mg by mouth daily.     Yes Historical Provider, MD  Calcium Carb-Cholecalciferol (CALCIUM-VITAMIN D) 500-400 MG-UNIT TABS Take 1 tablet by mouth 2 (two) times daily.     Yes Historical Provider, MD  cholecalciferol (VITAMIN D) 1000 UNITS tablet Take 2,000 Units by mouth daily.     Yes Historical Provider, MD  clonazePAM (KLONOPIN) 1 MG tablet Take 1.5 mg by mouth daily.     Yes Historical Provider, MD  clopidogrel (PLAVIX) 75 MG tablet Take 75 mg by mouth daily.     Yes Historical Provider, MD  Multiple Vitamins-Minerals (PRESERVISION AREDS PO) Take 1 tablet by mouth 2 (two) times daily.     Yes Historical Provider, MD  omega-3 acid ethyl esters (LOVAZA) 1 G capsule Take 4 g by mouth daily.     Yes Historical Provider, MD  pantoprazole (PROTONIX) 40 MG tablet Take 40 mg by mouth daily.     Yes Historical Provider, MD  Prenatal Vit-Fe Fumarate-FA (M-VIT PO) Take 1 tablet by mouth daily.     Yes Historical Provider, MD  venlafaxine (EFFEXOR) 37.5 MG tablet Take 75 mg by mouth 2 (two) times daily.     Yes Historical Provider, MD  nitroGLYCERIN (NITROSTAT) 0.4 MG SL tablet Place 0.4 mg under the tongue every 5 (five) minutes as needed. For chest pain.  Historical Provider, MD    Family History: History reviewed. No pertinent family history.  Social History:  reports that she has never smoked. She does not have any smokeless tobacco history on file. She reports that she does not drink alcohol or use illicit drugs.  Review of Systems:  Constitutional:   No  weight loss, night sweats,  Fevers, chills, fatigue.  HEENT:    Mild headaches,  Difficulty swallowing,  Tooth/dental problems,  Sore throat,  No sneezing, itching, ear ache, nasal congestion, post nasal drip,   Cardio-vascular: No chest pain,  Orthopnea, PND,  swelling in lower extremities, anasarca,dizziness, palpitations  GI:   No heartburn, indigestion, abdominal pain, nausea, vomiting, change in  bowel habits, loss of appetite  Resp:  No shortness of breath with exertion or at rest.  No excess mucus, no productive cough,  No non-productive cough,  No coughing up of blood.  No change in color of mucus.  No wheezing.  No chest wall deformity  Skin:  no rash or lesions.  GU:  no dysuria, change in color of urine, no urgency or frequency.  No flank pain.  Musculoskeletal: No joint pain or swelling.  No decreased range of motion.  No back pain.  Psych: No change in mood or affect. No depression or anxiety.  No memory loss.   Physical Exam: Blood pressure 136/80, pulse 63, temperature 97.7 F (36.5 C), temperature source Oral, resp. rate 16, SpO2 93.00%.  General appearance :Awake, alert, not in any distress. Speech Clear. Not toxic Looking. HEENT: Atraumatic and Normocephalic, pupils equally reactive to light and accomodation.Dry oral mucous membranes Neck: supple, no JVD. No cervical lymphadenopathy.  Chest:Good air entry bilaterally, no added sounds  CVS: S1 S2 regular, no murmurs.  Abdomen: Bowel sounds present,  mild diffuse  Tenderness.Not distended with no gaurding, rigidity or rebound. Extremities: B/L Lower Ext shows no edema, both legs are warm to touch, with  dorsalis pedis pulses palpable. Neurology: Awake alert, and oriented X 3, CN II-XII intact, Non focal, Deep Tendon Reflex-2+ all over, plantar's downgoing B/L, sensory exam is grossly intact.  Skin:No Rash Wounds:N/A  Labs on Admission:   [facility] 09/27/11 1531  NA 138  K 3.2*  CL 99  CO2 29  GLUCOSE 108*  BUN 19  CREATININE 0.89  CALCIUM 10.0  MG --  PHOS --    Basename 09/27/11 1531  AST 28  ALT 26  ALKPHOS 91  BILITOT 0.3  PROT 7.2  ALBUMIN 3.4*   No results found for this basename: LIPASE:2,AMYLASE:2 in the last 72 hours  Basename 09/27/11 1531   WBC 5.3  NEUTROABS 2.8  HGB 13.4  HCT 39.9  MCV 88.5  PLT 229    Radiological Exams on Admission: Pending acute abdominal series  Assessment/Plan Present on Admission:  .Diarrhea -There is no fever or blood/mucus in stools-patient was exposed to a grandchild with viral illness, this problem is a viral illness however it has been going on for 3-4 days. -She has had no exposure to any antibiotics recently. -She does have evidence of dehydration clinically. -She will be admitted to a telemetry unit, IV fluids will be started, stool cultures and PCR C. diff will be sent. -She will be provided with other supportive measures.  .Chest pain -She does have a history of coronary artery disease, she did have a recent PTCA. -However the chest pain does sound atypical. Point-of-care cardiac enzymes are negative. -She will be admitted to a telemetry unit, cardiac enzymes  were cycled. -Aspirin and Plavix will be resumed.  [facility] cardiology will be consulted, if her cardiac enzymes become positive.  Marland KitchenCAD (coronary artery disease) -As above   .HTN (hypertension) -She claims that she takes Toprol and Cozaar as an outpatient. -We will resume Toprol for now, depending on her blood pressure readings Cozaar can be resumed.   .Dyslipidemia -Resume statin.   Paroxysmal atrial fibrillation -Currently in sinus rhythm. -Not on chronic Coumadin therapy  DVT prophylaxis -Lovenox  CODE STATUS -Full code.   Further plan will depend as patient's clinical course evolves, and further laboratory studies obtained  Total time spent for admission 45 minutes.   Jeoffrey Massed 09/27/2011, 5:10 PM

## 2011-09-27 NOTE — ED Notes (Signed)
Reports chest pain since Monday, radiates to both arms, had n/v/d since Sunday. ekg done at triage.

## 2011-09-27 NOTE — ED Notes (Signed)
Patient states onset 3 days ago diarrhea and chest pain 2 days ago. Airway intact bilateral equal chest rise. Currently chest pain is 5/10 pressure.

## 2011-09-28 LAB — CREATININE, SERUM
Creatinine, Ser: 0.88 mg/dL (ref 0.50–1.10)
GFR calc Af Amer: 76 mL/min — ABNORMAL LOW (ref >90)

## 2011-09-28 LAB — COMPREHENSIVE METABOLIC PANEL
ALT: 22 U/L (ref 0–35)
AST: 26 U/L (ref 0–37)
Alkaline Phosphatase: 71 U/L (ref 39–117)
CO2: 28 mEq/L (ref 19–32)
Chloride: 104 mEq/L (ref 96–112)
Creatinine, Ser: 0.94 mg/dL (ref 0.50–1.10)
GFR calc non Af Amer: 61 mL/min — ABNORMAL LOW (ref >90)
Potassium: 2.7 mEq/L — CL (ref 3.5–5.1)
Sodium: 142 mEq/L (ref 135–145)
Total Bilirubin: 0.3 mg/dL (ref 0.3–1.2)

## 2011-09-28 LAB — CBC
Hemoglobin: 11.6 g/dL — ABNORMAL LOW (ref 12.0–15.0)
MCH: 29.2 pg (ref 26.0–34.0)
MCV: 88.5 fL (ref 78.0–100.0)
Platelets: 212 10*3/uL (ref 150–400)
RBC: 3.91 MIL/uL (ref 3.87–5.11)
RBC: 3.97 MIL/uL (ref 3.87–5.11)
WBC: 4.8 10*3/uL (ref 4.0–10.5)

## 2011-09-28 LAB — POTASSIUM: Potassium: 4.6 mEq/L (ref 3.5–5.1)

## 2011-09-28 LAB — MAGNESIUM: Magnesium: 1.4 mg/dL — ABNORMAL LOW (ref 1.5–2.5)

## 2011-09-28 LAB — TROPONIN I: Troponin I: <0.3 ng/mL (ref <0.30)

## 2011-09-28 MED ORDER — OMEGA-3-ACID ETHYL ESTERS 1 G PO CAPS
2.0000 g | ORAL_CAPSULE | Freq: Two times a day (BID) | ORAL | Status: DC
  Administered 2011-09-28: 1 g via ORAL
  Administered 2011-09-28 – 2011-09-29 (×2): 2 g via ORAL
  Filled 2011-09-28 (×3): qty 2

## 2011-09-28 MED ORDER — POTASSIUM CHLORIDE CRYS ER 20 MEQ PO TBCR
40.0000 meq | EXTENDED_RELEASE_TABLET | ORAL | Status: AC
  Administered 2011-09-28 (×3): 40 meq via ORAL
  Filled 2011-09-28 (×3): qty 2

## 2011-09-28 MED ORDER — LACTINEX PO CHEW
1.0000 | CHEWABLE_TABLET | Freq: Two times a day (BID) | ORAL | Status: DC
  Filled 2011-09-28 (×4): qty 1

## 2011-09-28 MED ORDER — POTASSIUM CHLORIDE IN NACL 40-0.9 MEQ/L-% IV SOLN
INTRAVENOUS | Status: DC
  Administered 2011-09-28: 17:00:00 via INTRAVENOUS
  Filled 2011-09-28 (×3): qty 1000

## 2011-09-28 MED ORDER — RISAQUAD PO CAPS
1.0000 | ORAL_CAPSULE | Freq: Two times a day (BID) | ORAL | Status: DC
  Administered 2011-09-28: 1 via ORAL
  Filled 2011-09-28 (×6): qty 1

## 2011-09-28 NOTE — Progress Notes (Signed)
09/28/2011 Morgan [name] SPARKS Case Management Note [phone]       Utilization review completed.  

## 2011-09-28 NOTE — Progress Notes (Signed)
PATIENT DETAILS Name: Morgan [name] Age: 70 y.o. Sex: female Date of Birth: 06/08/1941 Admit Date: 09/27/2011 PCP:No primary provider on file.   Subjective: Minimal chest pain, claims the diarrhea has slowed down, to around 4-5 episodes today.  Objective: Vital signs in last 24 hours: Patient Vitals for the past 24 hrs:  BP Temp Temp src Pulse Resp SpO2 Height Weight  09/28/11 1027 139/60 mmHg 97.6 F (36.4 C) Oral 58  20  95 % - -  09/28/11 0600 118/64 mmHg 98.2 F (36.8 C) Oral 58  20  - - -  09/28/11 0142 114/60 mmHg 96.1 F (35.6 C) Oral 60  20  - - -  09/27/11 2008 122/57 mmHg 97 F (36.1 C) Oral 52  17  96 % 5\' 4"  (1.626 m) 82.9 kg (182 lb 12.2 oz)  09/27/11 1732 110/57 mmHg - - 56  16  98 % - -  09/27/11 1727 110/57 mmHg - - 56  14  97 % - -  09/27/11 1506 136/80 mmHg - - 63  16  93 % - -   Weight change:  Body mass index is 31.37 kg/(m^2).  Intake/Output from previous day:  Intake/Output Summary (Last 24 hours) at 09/28/11 1428 Last data filed at 09/28/11 1028  Gross per 24 hour  Intake   2478 ml  Output    453 ml  Net   2025 ml    PHYSICAL EXAM: Gen Exam: Awake and alert with clear speech.   Neck: Supple, No JVD.   Chest: B/L Clear.   CVS: S1 S2 Regular, no murmurs.  Abdomen: soft, BS +, non tender, non distended.  Extremities: no edema, warm.   Neurologic: Non Focal.   Skin: No Rash.   Wounds: N/A.    CONSULTS: Spoke with Dr. Anne Fu at patient's request, per Dr. Anne Fu if cardiac enzymes negative and if pain atypical no further workup required.  LAB RESULTS:  Basename 09/28/11 0415 09/27/11 2331  WBC 4.8 4.8  HGB 11.4* 11.6*  HCT 34.6* 35.0*  PLT 212 214   CMET CMP     Component Value Date/Time   NA 142 09/28/2011 0415   K 2.7* 09/28/2011 0415   CL 104 09/28/2011 0415   CO2 28 09/28/2011 0415   GLUCOSE 87 09/28/2011 0415   BUN 18 09/28/2011 0415   CREATININE 0.94 09/28/2011 0415   CALCIUM 8.5 09/28/2011 0415   PROT 5.8* 09/28/2011  0415   ALBUMIN 2.9* 09/28/2011 0415   AST 26 09/28/2011 0415   ALT 22 09/28/2011 0415   ALKPHOS 71 09/28/2011 0415   BILITOT 0.3 09/28/2011 0415   GFRNONAA 61* 09/28/2011 0415   GFRAA 70* 09/28/2011 0415    GFR Estimated Creatinine Clearance: 58.9 ml/min (by C-G formula based on Cr of 0.94). No results found for this basename: LIPASE:2,AMYLASE:2 in the last 72 hours  Basename 09/28/11 1230 09/28/11 0905 09/28/11 0415  CKTOTAL -- -- --  CKMB -- -- --  CKMBINDEX -- -- --  TROPONINI <0.30 <0.30 <0.30  COAGS No results found for this basename: PT:2,INR:2 in the last 72 hours MICROBIOLOGY: No results found for this or any previous visit (from the past 240 hour(s)).  RADIOLOGY STUDIES/RESULTS: Dg Abd Acute W/chest  09/27/2011  *RADIOLOGY REPORT*  Clinical Data: Abdominal pain and discomfort  ACUTE ABDOMEN SERIES (ABDOMEN 2 VIEW & CHEST 1 VIEW)  Comparison: None  Findings: Sternotomy wires overlie normal heart silhouette.  There is chronic bronchitic markings centrally.  No free air beneath  hemidiaphragm.  No consolidation.  No dilated loops of large or small bowel.  No pathologic calcifications.  No aggressive osseous lesions.  IMPRESSION:  1.  No acute cardiopulmonary findings.  2. No evidence of bowel obstruction or free air.  Original Report Authenticated By: Genevive Bi, M.D.   MEDICATIONS: Scheduled Meds:   . sodium chloride   Intravenous STAT  . aspirin EC  81 mg Oral Daily  . cholecalciferol  2,000 Units Oral Daily  . clonazePAM  1.5 mg Oral Daily  . clopidogrel  75 mg Oral Daily  . enoxaparin  40 mg Subcutaneous Q24H  . gabapentin  300 mg Oral QHS  . metoprolol succinate  25 mg Oral Daily  .  morphine injection  2 mg Intravenous Once  . omega-3 acid ethyl esters  2 g Oral BID  . ondansetron  4 mg Intravenous Once  . pantoprazole  40 mg Oral Daily  . potassium chloride  40 mEq Oral Q4H  . rosuvastatin  40 mg Oral Daily  . sodium chloride  1,000 mL Intravenous Once    . venlafaxine  75 mg Oral BID  . DISCONTD: omega-3 acid ethyl esters  4 g Oral Daily   Continuous Infusions:   . 0.9 % NaCl with KCl 40 mEq / L    . DISCONTD: sodium chloride 100 mL/hr at 09/28/11 0307   PRN Meds:.acetaminophen, acetaminophen, albuterol, alum & mag hydroxide-simeth, HYDROcodone-acetaminophen, morphine, nitroGLYCERIN, ondansetron (ZOFRAN) IV, ondansetron, promethazine, promethazine  Antibiotics: Anti-infectives    None     Assessment/Plan: Patient Active Hospital Problem List: Diarrhea (09/27/2011)   Assessment: Slightly better, Likely viral, however going on since the past 4 days.    Plan: Continue with supportive care, await stool studies.   Chest pain (09/27/2011)   Assessment: Significantly better, Likely atypical, as bilateral and in a setting of GI issues    Plan: Monitor, troponins x3 negative. Spoke with Dr. Anne Fu, no need for further workup.   CAD (coronary artery disease) (09/27/2011)   Assessment: Troponins time 3 negative    Plan: Continue with aspirin and Plavix   HTN (hypertension) (09/27/2011)   Assessment: Controlled    Plan: Continue with Lopressor   Dyslipidemia (09/27/2011)   Assessment: Stable    Plan: Continue with Crestor   Paroxysmal a-fib (09/27/2011)   Assessment: In sinus rhythm    Plan: Continue with Lopressor  Disposition  Remain inpatient  Maretta Bees, MD. 09/28/2011, 2:28 PM

## 2011-09-29 ENCOUNTER — Telehealth: Payer: Self-pay | Admitting: Internal Medicine

## 2011-09-29 LAB — BASIC METABOLIC PANEL
CO2: 26 mEq/L (ref 19–32)
Calcium: 8.4 mg/dL (ref 8.4–10.5)
Chloride: 109 mEq/L (ref 96–112)
Creatinine, Ser: 0.77 mg/dL (ref 0.50–1.10)
Glucose, Bld: 147 mg/dL — ABNORMAL HIGH (ref 70–99)

## 2011-09-29 MED ORDER — LOPERAMIDE HCL 2 MG PO TABS: 2.0000 mg | ORAL_TABLET | Freq: Four times a day (QID) | ORAL | Status: AC | PRN

## 2011-09-29 MED ORDER — FLORA-Q PO CAPS: 1.0000 | ORAL_CAPSULE | Freq: Every day | ORAL | Status: DC

## 2011-09-29 MED ORDER — HYDROCHLOROTHIAZIDE 12.5 MG PO TABS: 12.5000 mg | ORAL_TABLET | Freq: Every day | ORAL | Status: DC

## 2011-09-29 MED ORDER — LOSARTAN POTASSIUM 100 MG PO TABS: 100.0000 mg | ORAL_TABLET | Freq: Every day | ORAL | Status: DC

## 2011-09-29 MED ORDER — FLORA-Q PO CAPS
1.0000 | ORAL_CAPSULE | Freq: Two times a day (BID) | ORAL | Status: DC
  Administered 2011-09-29: 1 via ORAL
  Filled 2011-09-29 (×3): qty 1

## 2011-09-29 MED ORDER — METOPROLOL SUCCINATE ER 25 MG PO TB24: 50.0000 mg | ORAL_TABLET | Freq: Every day | ORAL | Status: DC

## 2011-09-29 NOTE — H&P (Signed)
PATIENT DETAILS Name: Morgan [name] Age: 70 y.o. Sex: female Date of Birth: 1941/01/19 MRN: [id number]. Admit Date: 09/27/2011 Admitting Physician: Jeoffrey Massed WUJ:[id number] [place]  PRIMARY DISCHARGE DIAGNOSIS:  Principal Problem:  *Diarrhea Active Problems:  Chest pain Hypokalemia  CAD (coronary artery disease)  HTN (hypertension)  Dyslipidemia  Paroxysmal a-fib      PAST MEDICAL HISTORY: Past Medical History  Diagnosis Date  . Coronary artery disease   . Hypertension   . High cholesterol   . Atrial fibrillation   . Hyperlipidemia   . Angina   . Shortness of breath     "w/exertion; before I had stent put in""  . Sleep apnea   . Migraines     "til ~ 1980"  . Headache     "recurring"  . Restless leg syndrome   . Weakness of right side of body     "I've had PT for it; they don't know what it's from"  . Diabetes mellitus     diet controlled  . Fibromyalgia   . Sciatic nerve pain     "from pinched nerve"  . Depression     DISCHARGE MEDICATIONS: Current Discharge Medication List    START taking these medications   Details  Flora-Q (FLORA-Q) CAPS Take 1 capsule by mouth daily. Qty: 30 capsule, Refills: 0    hydrochlorothiazide (HYDRODIURIL) 12.5 MG tablet Take 1 tablet (12.5 mg total) by mouth daily.    loperamide (IMODIUM A-D) 2 MG tablet Take 1 tablet (2 mg total) by mouth 4 (four) times daily as needed for diarrhea/loose stools. Qty: 30 tablet, Refills: 0    losartan (COZAAR) 100 MG tablet Take 1 tablet (100 mg total) by mouth daily.    metoprolol succinate (TOPROL-XL) 25 MG 24 hr tablet Take 2 tablets (50 mg total) by mouth daily.      CONTINUE these medications which have NOT CHANGED   Details  aspirin EC 81 MG tablet Take 81 mg by mouth daily.      atorvastatin (LIPITOR) 80 MG tablet Take 80 mg by mouth daily.      Calcium Carb-Cholecalciferol (CALCIUM-VITAMIN D) 500-400 MG-UNIT TABS Take 1 tablet by mouth 2 (two) times daily.        cholecalciferol (VITAMIN D) 1000 UNITS tablet Take 2,000 Units by mouth daily.      clonazePAM (KLONOPIN) 1 MG tablet Take 1.5 mg by mouth daily.      clopidogrel (PLAVIX) 75 MG tablet Take 75 mg by mouth daily.      Multiple Vitamins-Minerals (PRESERVISION AREDS PO) Take 1 tablet by mouth 2 (two) times daily.      omega-3 acid ethyl esters (LOVAZA) 1 G capsule Take 4 g by mouth daily.      pantoprazole (PROTONIX) 40 MG tablet Take 40 mg by mouth daily.      Prenatal Vit-Fe Fumarate-FA (M-VIT PO) Take 1 tablet by mouth daily.      venlafaxine (EFFEXOR) 37.5 MG tablet Take 75 mg by mouth 2 (two) times daily.      nitroGLYCERIN (NITROSTAT) 0.4 MG SL tablet Place 0.4 mg under the tongue every 5 (five) minutes as needed. For chest pain.       STOP taking these medications     amitriptyline (ELAVIL) 10 MG tablet          BRIEF HPI:  See H&P, Labs, Consult and Test reports for all details in brief, patient was admitted for **  CONSULTATIONS:   none  PERTINENT RADIOLOGIC STUDIES:  Dg Abd Acute W/chest  09/27/2011  *RADIOLOGY REPORT*  Clinical Data: Abdominal pain and discomfort  ACUTE ABDOMEN SERIES (ABDOMEN 2 VIEW & CHEST 1 VIEW)  Comparison: None  Findings: Sternotomy wires overlie normal heart silhouette.  There is chronic bronchitic markings centrally.  No free air beneath hemidiaphragm.  No consolidation.  No dilated loops of large or small bowel.  No pathologic calcifications.  No aggressive osseous lesions.  IMPRESSION:  1.  No acute cardiopulmonary findings.  2. No evidence of bowel obstruction or free air.  Original Report Authenticated By: Genevive Bi, M.D.     PERTINENT LAB RESULTS: CBC:  Basename 09/28/11 0415 09/27/11 2331  WBC 4.8 4.8  HGB 11.4* 11.6*  HCT 34.6* 35.0*  PLT 212 214   CMET CMP     Component Value Date/Time   NA 141 09/29/2011 0845   K 4.4 09/29/2011 0845   CL 109 09/29/2011 0845   CO2 26 09/29/2011 0845   GLUCOSE 147* 09/29/2011  0845   BUN 9 09/29/2011 0845   CREATININE 0.77 09/29/2011 0845   CALCIUM 8.4 09/29/2011 0845   PROT 5.8* 09/28/2011 0415   ALBUMIN 2.9* 09/28/2011 0415   AST 26 09/28/2011 0415   ALT 22 09/28/2011 0415   ALKPHOS 71 09/28/2011 0415   BILITOT 0.3 09/28/2011 0415   GFRNONAA 84* 09/29/2011 0845   GFRAA >90 09/29/2011 0845    GFR Estimated Creatinine Clearance: 69.3 ml/min (by C-G formula based on Cr of 0.77). No results found for this basename: LIPASE:2,AMYLASE:2 in the last 72 hours  Basename 09/28/11 2013 09/28/11 1615 09/28/11 1230  CKTOTAL -- -- --  CKMB -- -- --  CKMBINDEX -- -- --  TROPONINI <0.30 <0.30 <0.30   No results found for this basename: POCBNP:3 in the last 72 hours No results found for this basename: DDIMER:2 in the last 72 hours No results found for this basename: HGBA1C:2 in the last 72 hours No results found for this basename: CHOL:2,HDL:2,LDLCALC:2,TRIG:2,CHOLHDL:2,LDLDIRECT:2 in the last 72 hours No results found for this basename: TSH,T4TOTAL,FREET3,T3FREE,THYROIDAB in the last 72 hours No results found for this basename: VITAMINB12:2,FOLATE:2,FERRITIN:2,TIBC:2,IRON:2,RETICCTPCT:2 in the last 72 hours Coags: No results found for this basename: PT:2,INR:2 in the last 72 hours Microbiology: Recent Results (from the past 240 hour(s))  CLOSTRIDIUM DIFFICILE BY PCR     Status: Normal   Collection Time   09/28/11  2:33 PM      Component Value Range Status Comment   C difficile by pcr NEGATIVE  NEGATIVE  Final      BRIEF HOSPITAL COURSE:   - Principal Problem:  *Diarrhea: As noted in the history of present illness patient was admitted for that diarrhea and resultant dehydration. Please note that this patient was exposed to her grandchild who had a viral illness with similar symptoms. Patient claims that she was having around 15 or 16 loose watery stools a day. She was feeling weak and hence she presented to the ED. On evaluation she was found to be dehydrated  clinically would dry mucous membranes. She did not have a fever and she denied mucus or blood in the stools. Given the clinical scenario it is highly suspicious that she too was having a viral syndrome responsible for her complaints. She was then admitted to the hospitalist service and started on IV fluids and given other supportive measures. Stool for C. difficile and cultures were sent. At the time of this dictation her C. difficile PCR was negative. Her stool cultures at the time  of this dictation is still pending. However clinically she seems to have made improvement, yesterday she claims she had only 3 loose bowel movements.At the time of this dictation, patient has not had any bowel movements today. Please note she's never had any vomiting or nausea. During my rounds today, patient is requesting that she be discharged home. She claims that her father-in-law who is in up state [place] and is a hospice patient is actively dying. She claims she wants to be with family in this difficult time. Unfortunately her husband has also developed diarrhea. Since the patient is able to tolerate diet, and had diarrheal symptoms seem to be slowly resolving I will be discharging the patient at her request. I have talked with the patient's daughter Arline Asp over the phone, I have explained to her that this most likely is a viral syndrome and that if the family would keep a close eye on the patient make sure that she is getting getting fluids and is not becoming dehydrated. Arline Asp told me that the family can can keep an eye on the patient. I have explained to the patient that she should seek immediate medical attention if she were to have fever, bIood/ mucus in her stools, or if her diarrhea were to worsen. She claims understanding. -Unfortunately her stool culture results are still pending at the time of dictation and will need followup when she follows up with her primary care practitioner. -She will discharged on a probiotic  and Imodium as needed. She has been asked to drink Gatorade and keep well hydrated.  Active Problems:  Chest pain -Is very atypical as it was bilateral. Cardiac enzymes were cycled and these are negative. Dr. Anne Fu was consulted over the phone by name and he recommended no further workup. In fact Dr. Anne Fu did a social visit with the patient earlier today    CAD (coronary artery disease) -She will followup with Dr. Anne Fu as an outpatient.    HTN (hypertension) -She will continue Toprol, she has a pressure monitoring machine at home and will slowly resume her Cozaar and hydrochlorothiazide as her diarrhea resolves. She will followup with her primary care practitioner for further optimization of her medications.  Hypokalemia  -this is secondary to GI loss and has now resolved.   Dyslipidemia -Continue with Lipitor   Paroxysmal a-fib  -She is sinus rhythm continue with Toprol.   TODAY-DAY OF DISCHARGE:  Subjective:   Morgan [name] today has no headache,no chest abdominal pain,no new weakness tingling or numbness, feels much better wants to go home today. Diet is significantly improved and now down to 3 episodes a day. Interestingly none so far today  Objective:   Blood pressure 156/77, pulse 55, temperature 97 F (36.1 C), temperature source Oral, resp. rate 20, height 5\' 4"  (1.626 m), weight 83.3 kg (183 lb 10.3 oz), SpO2 99.00%.  Intake/Output Summary (Last 24 hours) at 09/29/11 1202 Last data filed at 09/29/11 0827  Gross per 24 hour  Intake   1695 ml  Output    625 ml  Net   1070 ml    Exam Awake Alert, Oriented *3, No new F.N deficits, Normal affect [place].AT,PERRAL Supple Neck,No JVD, No cervical lymphadenopathy appriciated.  Symmetrical Chest wall movement, Good air movement bilaterally, CTAB RRR,No Gallops,Rubs or new Murmurs, No Parasternal Heave +ve B.Sounds, Abd Soft, Non tender, No organomegaly appriciated, No rebound -guarding or rigidity. No Cyanosis,  Clubbing or edema, No new Rash or bruise  DISPOSITION: Home-at patient's request.  DISCHARGE INSTRUCTIONS:    Follow-up Information    Follow up with Donato Schultz, MD. Make an appointment in 2 weeks.   Contact information:   [address] [place] [address] [phone]       Follow up with [facility], [facility], [facility]. Make an appointment in 5 days.   Contact information:   [address]  [place], [place] [address] [phone]        Total Time spent on discharge equals 45 minutes.  SignedJeoffrey Massed 09/29/2011 12:02 PM

## 2011-10-02 LAB — STOOL CULTURE

## 2011-10-25 ENCOUNTER — Ambulatory Visit: Payer: BC Managed Care – PPO | Admitting: Family Medicine

## 2011-10-25 ENCOUNTER — Ambulatory Visit: Payer: BC Managed Care – PPO

## 2012-01-15 ENCOUNTER — Telehealth: Payer: Self-pay | Admitting: Internal Medicine

## 2012-03-07 ENCOUNTER — Encounter (HOSPITAL_COMMUNITY): Payer: Self-pay | Admitting: *Deleted

## 2012-03-07 ENCOUNTER — Emergency Department (HOSPITAL_COMMUNITY)
Admission: EM | Admit: 2012-03-07 | Discharge: 2012-03-07 | Disposition: A | Discharge status: Home / Self Care | Payer: Medicare Other | Attending: Emergency Medicine | Admitting: Emergency Medicine

## 2012-03-07 ENCOUNTER — Emergency Department (HOSPITAL_COMMUNITY): Payer: Medicare Other

## 2012-03-07 DIAGNOSIS — I2510 Atherosclerotic heart disease of native coronary artery without angina pectoris: Secondary | ICD-10-CM | POA: Insufficient documentation

## 2012-03-07 DIAGNOSIS — I4891 Unspecified atrial fibrillation: Secondary | ICD-10-CM | POA: Insufficient documentation

## 2012-03-07 DIAGNOSIS — E119 Type 2 diabetes mellitus without complications: Secondary | ICD-10-CM | POA: Insufficient documentation

## 2012-03-07 DIAGNOSIS — S8000XA Contusion of unspecified knee, initial encounter: Secondary | ICD-10-CM | POA: Insufficient documentation

## 2012-03-07 DIAGNOSIS — X58XXXA Exposure to other specified factors, initial encounter: Secondary | ICD-10-CM | POA: Insufficient documentation

## 2012-03-07 DIAGNOSIS — M25469 Effusion, unspecified knee: Secondary | ICD-10-CM | POA: Insufficient documentation

## 2012-03-07 DIAGNOSIS — R112 Nausea with vomiting, unspecified: Secondary | ICD-10-CM | POA: Insufficient documentation

## 2012-03-07 DIAGNOSIS — R0609 Other forms of dyspnea: Secondary | ICD-10-CM | POA: Insufficient documentation

## 2012-03-07 DIAGNOSIS — Z951 Presence of aortocoronary bypass graft: Secondary | ICD-10-CM | POA: Insufficient documentation

## 2012-03-07 DIAGNOSIS — R42 Dizziness and giddiness: Secondary | ICD-10-CM | POA: Insufficient documentation

## 2012-03-07 DIAGNOSIS — R079 Chest pain, unspecified: Secondary | ICD-10-CM | POA: Insufficient documentation

## 2012-03-07 DIAGNOSIS — I10 Essential (primary) hypertension: Secondary | ICD-10-CM | POA: Insufficient documentation

## 2012-03-07 DIAGNOSIS — IMO0001 Reserved for inherently not codable concepts without codable children: Secondary | ICD-10-CM | POA: Insufficient documentation

## 2012-03-07 DIAGNOSIS — R0989 Other specified symptoms and signs involving the circulatory and respiratory systems: Secondary | ICD-10-CM | POA: Insufficient documentation

## 2012-03-07 DIAGNOSIS — M25569 Pain in unspecified knee: Secondary | ICD-10-CM | POA: Insufficient documentation

## 2012-03-07 LAB — POCT I-STAT TROPONIN I: Troponin i, poc: 0.02 ng/mL (ref 0.00–0.08)

## 2012-03-07 LAB — CBC
HCT: 43 % (ref 36.0–46.0)
Hemoglobin: 14.1 g/dL (ref 12.0–15.0)
MCHC: 32.8 g/dL (ref 30.0–36.0)
MCV: 91.9 fL (ref 78.0–100.0)
RDW: 13.2 % (ref 11.5–15.5)

## 2012-03-07 MED ORDER — NITROGLYCERIN 2 % TD OINT
1.0000 [in_us] | TOPICAL_OINTMENT | Freq: Once | TRANSDERMAL | Status: AC
  Administered 2012-03-07: 1 [in_us] via TOPICAL
  Filled 2012-03-07: qty 1

## 2012-03-07 MED ORDER — NITROGLYCERIN 0.4 MG SL SUBL
0.4000 mg | SUBLINGUAL_TABLET | SUBLINGUAL | Status: DC | PRN
  Filled 2012-03-07: qty 75

## 2012-03-07 NOTE — ED Notes (Signed)
Patient states walking to car after having right knee drained and developed shortness of breath, diaphoresis, with chest pain. States sat in car for a few minutes then drove to the front office and Doctor's office called EMS.  Upon arrival patient ax4 resting comfortably on stretcher.  Insisted to use the bathroom patient ambulated steady gait and returned without incident.  Patient currently resting comfortably on stretcher airway intact bilateral equal chest rise and fall. Currently pain is 3-4/10 burning heaviness.

## 2012-03-07 NOTE — ED Notes (Signed)
PT went to car after a RT knee aspiration at [facility] . Pt reported she felt weak and had CP.PT has a known HX of CP but does not take her meds for the pain.  PT returned to [facility] office and She was given a IV bolus of NS at office. On arrival to ED PT IV was clotted . The IV was removed.  PT still reports a CP of 3/10 no radiation of pain.

## 2012-03-07 NOTE — ED Notes (Signed)
Notified lab tech for the blood draw

## 2012-03-07 NOTE — Discharge Instructions (Signed)
Chest Pain (Nonspecific) It is often hard to give a specific diagnosis for the cause of chest pain. There is always a chance that your pain could be related to something serious, such as a heart attack or a blood clot in the lungs. You need to follow up with your caregiver for further evaluation. CAUSES   Heartburn.   Pneumonia or bronchitis.   Anxiety or stress.   Inflammation around your heart (pericarditis) or lung (pleuritis or pleurisy).   A blood clot in the lung.   A collapsed lung (pneumothorax). It can develop suddenly on its own (spontaneous pneumothorax) or from injury (trauma) to the chest.   Shingles infection (herpes zoster virus).  The chest wall is composed of bones, muscles, and cartilage. Any of these can be the source of the pain.  The bones can be bruised by injury.   The muscles or cartilage can be strained by coughing or overwork.   The cartilage can be affected by inflammation and become sore (costochondritis).  DIAGNOSIS  Lab tests or other studies, such as X-rays, electrocardiography, stress testing, or cardiac imaging, may be needed to find the cause of your pain.  TREATMENT   Treatment depends on what may be causing your chest pain. Treatment may include:   Acid blockers for heartburn.   Anti-inflammatory medicine.   Pain medicine for inflammatory conditions.   Antibiotics if an infection is present.   You may be advised to change lifestyle habits. This includes stopping smoking and avoiding alcohol, caffeine, and chocolate.   You may be advised to keep your head raised (elevated) when sleeping. This reduces the chance of acid going backward from your stomach into your esophagus.   Most of the time, nonspecific chest pain will improve within 2 to 3 days with rest and mild pain medicine.  HOME CARE INSTRUCTIONS   If antibiotics were prescribed, take your antibiotics as directed. Finish them even if you start to feel better.   For the next few  days, avoid physical activities that bring on chest pain. Continue physical activities as directed.   Do not smoke.   Avoid drinking alcohol.   Only take over-the-counter or prescription medicine for pain, discomfort, or fever as directed by your caregiver.   Follow your caregiver's suggestions for further testing if your chest pain does not go away.   Keep any follow-up appointments you made. If you do not go to an appointment, you could develop lasting (chronic) problems with pain. If there is any problem keeping an appointment, you must call to reschedule.  SEEK MEDICAL CARE IF:   You think you are having problems from the medicine you are taking. Read your medicine instructions carefully.   Your chest pain does not go away, even after treatment.   You develop a rash with blisters on your chest.  SEEK IMMEDIATE MEDICAL CARE IF:   You have increased chest pain or pain that spreads to your arm, neck, jaw, back, or abdomen.   You develop shortness of breath, an increasing cough, or you are coughing up blood.   You have severe back or abdominal pain, feel nauseous, or vomit.   You develop severe weakness, fainting, or chills.   You have a fever.  THIS IS AN EMERGENCY. Do not wait to see if the pain will go away. Get medical help at once. Call your local emergency services (911 in [place] Do not drive yourself to the hospital. MAKE SURE YOU:   Understand these instructions.     Will watch your condition.   Will get help right away if you are not doing well or get worse.  Document Released: 08/09/2005 Document Revised: 10/19/2011 Document Reviewed: 06/04/2008 [employer] Patient Information 2012 [employer]. 

## 2012-03-07 NOTE — ED Provider Notes (Signed)
History     CSN: [id number]  Arrival date & time 03/07/12  1650   First MD Initiated Contact with Patient 03/07/12 1651      Chief Complaint  Patient presents with  . Chest Pain  . Knee Pain    RT    (Consider location/radiation/quality/duration/timing/severity/associated sxs/prior treatment) HPI Comments: Patient here with acute onset of chest pain - states she was at her orthopedist office today where she received an injection into her right knee - states that she walked out to the car after this event, where she began to feel dizzy and have upper anterior chest pain.  States she went back into the office and informed the staff, she states she developed nausea and one episode of vomiting at the time as well.  States she has had chest pain since her CABG years ago in [place], states that she had a stent placed in April of 2012 by [facility] and has been followed by them since (Dr. Anne Fu).  Currently she reports having 3/10 chest pain, reports that it is not radiating, denies dizziness or nausea at this time.  She has taken an ASA today and plavix, did not take nitro but states that the pain eased on it's own.  Patient is a 71 y.o. female presenting with chest pain and knee pain. The history is provided by the patient. No language interpreter was used.  Chest Pain The chest pain began 1 - 2 hours ago. Chest pain occurs constantly. The chest pain is unchanged. At its most intense, the pain is at 7/10. The pain is currently at 3/10. The severity of the pain is mild. The quality of the pain is described as aching. The pain does not radiate. Primary symptoms include nausea, vomiting and dizziness. Pertinent negatives for primary symptoms include no fever, no fatigue, no syncope, no shortness of breath, no cough, no wheezing, no palpitations, no abdominal pain and no altered mental status.  Dizziness also occurs with nausea and vomiting. Dizziness does not occur with weakness or  diaphoresis.   Pertinent negatives for associated symptoms include no claudication, no diaphoresis, no lower extremity edema, no near-syncope, no numbness, no orthopnea, no paroxysmal nocturnal dyspnea and no weakness. She tried aspirin for the symptoms.  Her past medical history is significant for CAD, hyperlipidemia and hypertension.  Procedure history is positive for cardiac catheterization.    Knee Pain Associated symptoms include chest pain, nausea and vomiting. Pertinent negatives include no abdominal pain, coughing, diaphoresis, fatigue, fever, numbness or weakness.    Past Medical History  Diagnosis Date  . Coronary artery disease   . Hypertension   . High cholesterol   . Atrial fibrillation   . Hyperlipidemia   . Angina   . Shortness of breath     "w/exertion; before I had stent put in""  . Sleep apnea   . Migraines     "til ~ 1980"  . Headache     "recurring"  . Restless leg syndrome   . Weakness of right side of body     "I've had PT for it; they don't know what it's from"  . Diabetes mellitus     diet controlled  . Fibromyalgia   . Sciatic nerve pain     "from pinched nerve"  . Depression     Past Surgical History  Procedure Date  . Coronary artery bypass graft 2005    CABG X 2  . Coronary angioplasty with stent placement 06/2011    "  1"  . Fracture surgery ~ 2005    nose  . Nasal septum surgery ~ 1986  . 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 Dilation and curettage of uterus     "more than once"    No family history on file.  History  Substance Use Topics  . Smoking status: Never Smoker   . Smokeless tobacco: Never Used  . Alcohol Use: 0.6 oz/week    1 Glasses of wine per week     "occasionally drink wine"    OB History    Grav Para Term Preterm Abortions TAB SAB Ect Mult Living                  Review of Systems  Constitutional: Negative for fever, diaphoresis and fatigue.    Respiratory: Negative for cough, shortness of breath and wheezing.   Cardiovascular: Positive for chest pain. Negative for palpitations, orthopnea, claudication, syncope and near-syncope.  Gastrointestinal: Positive for nausea and vomiting. Negative for abdominal pain.  Neurological: Positive for dizziness. Negative for weakness and numbness.  Psychiatric/Behavioral: Negative for altered mental status.  All other systems reviewed and are negative.    Allergies  Percocet and Sulfa antibiotics  Home Medications   Current Outpatient Rx  Name Route Sig Dispense Refill  . ASPIRIN EC 81 MG PO TBEC Oral Take 81 mg by mouth daily.      . ATORVASTATIN CALCIUM 80 MG PO TABS Oral Take 80 mg by mouth every morning.     [name] CALCIUM-VITAMIN D 500-400 MG-UNIT PO TABS Oral Take 1 tablet by mouth 2 (two) times daily.      [name] VITAMIN D 1000 UNITS PO TABS Oral Take 2,000 Units by mouth daily.      [name] CLONAZEPAM 1 MG PO TABS Oral Take 1.5 mg by mouth daily.      [name] CLOPIDOGREL BISULFATE 75 MG PO TABS Oral Take 75 mg by mouth daily.      [name] HYDROCHLOROTHIAZIDE 12.5 MG PO TABS Oral Take 1 tablet (12.5 mg total) by mouth daily.    [name] LOSARTAN POTASSIUM 100 MG PO TABS Oral Take 1 tablet (100 mg total) by mouth daily.      Resume in the next few days  . METOPROLOL SUCCINATE ER 50 MG PO TB24 Oral Take 50 mg by mouth at bedtime. Take with or immediately following a meal.    . PRESERVISION AREDS PO Oral Take 1 tablet by mouth 2 (two) times daily.      [name] NITROGLYCERIN 0.4 MG SL SUBL Sublingual Place 0.4 mg under the tongue every 5 (five) minutes as needed. For chest pain    . OMEGA-3-ACID ETHYL ESTERS 1 G PO CAPS Oral Take 2 g by mouth 2 (two) times daily.     [name] PANTOPRAZOLE SODIUM 40 MG PO TBEC Oral Take 40 mg by mouth daily.      . VENLAFAXINE HCL 37.5 MG PO TABS Oral Take 75 mg by mouth 2 (two) times daily.        BP 128/55  Pulse 59  Temp(Src) 97.4 F (36.3 C) (Oral)  Resp 18  SpO2 99%  Physical Exam   Nursing note and vitals reviewed. Constitutional: She is oriented to person, place, and time. She appears well-developed and well-nourished. No distress.  HENT:  Head: Normocephalic and atraumatic.  Right Ear: External ear normal.  Left Ear: External ear normal.  Nose: Nose normal.  Mouth/Throat:  Oropharynx is clear and moist. No oropharyngeal exudate.  Eyes: Conjunctivae are normal. Pupils are equal, round, and reactive to light. No scleral icterus.  Neck: Normal range of motion. Neck supple.  Cardiovascular: Normal rate, regular rhythm and normal heart sounds.  Exam reveals no gallop and no friction rub.   No murmur heard. Pulmonary/Chest: Effort normal and breath sounds normal. No respiratory distress. She has no wheezes. She has no rales. She exhibits no tenderness.  Abdominal: Soft. Bowel sounds are normal. She exhibits no distension. There is no tenderness.  Musculoskeletal: She exhibits no edema.       Right knee: She exhibits decreased range of motion, swelling and ecchymosis. She exhibits no effusion.       Bandaid to medial knee  Lymphadenopathy:    She has no cervical adenopathy.  Neurological: She is alert and oriented to person, place, and time. No cranial nerve deficit.  Skin: Skin is warm and dry. No rash noted. No erythema. No pallor.  Psychiatric: She has a normal mood and affect. Her behavior is normal. Judgment and thought content normal.    ED Course  Procedures (including critical care time)   Labs Reviewed  PRO B NATRIURETIC PEPTIDE  CBC  MAGNESIUM   No results found.  Date: 03/07/2012  Rate: 64  Rhythm: normal sinus rhythm  QRS Axis: right  Intervals: normal  ST/T Wave abnormalities: nonspecific T wave changes  Conduction Disutrbances:none  Narrative Interpretation: Reviewed by Dr. Manus Gunning  Old EKG Reviewed: unchanged    Angina    MDM  Spoke with Dr. Anne Fu, plan to get second set of cardiac enzymes and he will see her in the office tomorrow  for follow up, have ordered second troponin and informed the patient of the plan, she is in agreement of this.      Second troponin is negative, patient remains pain free - she will follow up as planned with Dr. Anne Fu tomorrow  Scarlette Calico C. [place], [place] 03/07/12 2110

## 2012-03-07 NOTE — ED Notes (Signed)
Patient states now pain 0/10 prior to nitro administration.

## 2012-03-08 NOTE — ED Provider Notes (Signed)
Medical screening examination/treatment/procedure(s) were conducted as a shared visit with non-physician practitioner(s) and myself.  I personally evaluated the patient during the encounter  Typical angina with nausea and one episode of vomiting.  CTAB, RRR S/p CABG and stent. D/w Dr. Anne Fu.  Morgan Octave, MD 03/08/12 1052

## 2012-03-31 LAB — HM DIABETES EYE EXAM

## 2012-04-17 ENCOUNTER — Other Ambulatory Visit: Payer: Self-pay | Admitting: Orthopedic Surgery

## 2012-04-17 DIAGNOSIS — M545 Low back pain: Secondary | ICD-10-CM

## 2012-04-21 ENCOUNTER — Ambulatory Visit
Admission: RE | Admit: 2012-04-21 | Discharge: 2012-04-21 | Disposition: A | Discharge status: Home / Self Care | Payer: Medicare Other | Source: Ambulatory Visit | Attending: Orthopedic Surgery | Admitting: Orthopedic Surgery

## 2012-04-21 DIAGNOSIS — M545 Low back pain: Secondary | ICD-10-CM

## 2012-04-22 ENCOUNTER — Ambulatory Visit
Admission: RE | Admit: 2012-04-22 | Discharge: 2012-04-22 | Disposition: A | Discharge status: Home / Self Care | Payer: Medicare Other | Source: Ambulatory Visit | Attending: Orthopedic Surgery | Admitting: Orthopedic Surgery

## 2012-04-22 ENCOUNTER — Other Ambulatory Visit: Payer: Self-pay | Admitting: Orthopedic Surgery

## 2012-04-22 DIAGNOSIS — M25561 Pain in right knee: Secondary | ICD-10-CM

## 2012-08-29 ENCOUNTER — Other Ambulatory Visit: Payer: Self-pay | Admitting: Orthopedic Surgery

## 2012-08-29 MED ORDER — DEXAMETHASONE SODIUM PHOSPHATE 10 MG/ML IJ SOLN: 10.0000 mg | Freq: Once | INTRAMUSCULAR | Status: DC

## 2012-08-29 NOTE — Progress Notes (Signed)
Preoperative surgical orders have been place into the Epic hospital system for [facility] on 08/29/2012, 5:06 PM  by Patrica Duel for surgery on 09/04/12.  Preop Knee Scope orders including IV Tylenol and IV Decadron as long as there are no contraindications to the above medications. Avel Peace, PA-C

## 2012-08-30 ENCOUNTER — Encounter (HOSPITAL_COMMUNITY): Payer: Self-pay | Admitting: *Deleted

## 2012-08-30 ENCOUNTER — Encounter (HOSPITAL_COMMUNITY): Payer: Self-pay | Admitting: Pharmacy Technician

## 2012-08-30 NOTE — Progress Notes (Addendum)
PT'S MOST RECENT CARDIOLOGY OFFICE NOTES-DR. SKAINS FROM 03/08/12 AND 02/05/12, EKG 02/05/12, NUCLEAR STRESS TEST REPORT 02/05/12, CARDIAC CATH / HEART STENTING REPORT 03/08/11 AND SLEEP STUDY FOLLOW UP NOTES FROM DR. [name] TURNER--ARE ON PT'S CHART. EKG AND CXR REPORTS ARE IN EPIC FROM 03/07/12. [facility] SURGERY INSTRUCTIONS REVIEWED WITH PT INCLUDING BETASEPT SHOWERS / PRECAUTIONS.

## 2012-09-03 NOTE — H&P (Signed)
CC- Morgan [name] is a 71 y.o. female who presents with right knee pain.  HPI- . Knee Pain: Patient presents with knee pain involving the  right knee. Onset of the symptoms was several months ago. Inciting event: none known. Current symptoms include giving out, pain located laterally and stiffness. Pain is aggravated by going up and down stairs, pivoting, rising after sitting and squatting.  Patient has had no prior knee problems. Evaluation to date: MRI: abnormal lateral meniscal tear. Treatment to date: rest.  Past Medical History  Diagnosis Date  . Hypertension   . High cholesterol   . Hyperlipidemia   . Angina   . Shortness of breath     "w/exertion; before I had stent put in""  . Migraines     "til ~ 1980"  . Headache     "recurring"  . Restless leg syndrome   . Weakness of right side of body     "I've had PT for it; they don't know what it's from".  CORTISONE INJECTION INTO BACK 08/30/12  . Diabetes mellitus     diet controlled  . Fibromyalgia   . Sciatic nerve pain     "from pinched nerve"  . Depression   . Atrial fibrillation     ASPIRIN FOR BLOOD THINNER  . Coronary artery disease   . Sleep apnea     USES CPAP-  SETTING IS 11  . GERD (gastroesophageal reflux disease)   . H/O hiatal hernia     Past Surgical History  Procedure Date  . Coronary artery bypass graft 2005    CABG X 2  . Coronary angioplasty with stent placement 06/2011    "1"  . Fracture surgery ~ 2005    nose  . Nasal septum surgery ~ 1986  . 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 Dilation and curettage of uterus     "more than once"    Prior to Admission medications   Medication Sig Start Date End Date Taking? Authorizing Provider  clopidogrel (PLAVIX) 75 MG tablet Take 75 mg by mouth daily. PT STATES HER CARDIOLOGIST DISCONTINUED HER PLAVIX 2 WEEKS AGO--SHE WILL NOT HAVE TO TAKE ANYMORE   Yes Historical Provider, MD  aspirin EC 81 MG  tablet Take 81 mg by mouth every morning.     Historical Provider, MD  atorvastatin (LIPITOR) 80 MG tablet Take 80 mg by mouth every morning.     Historical Provider, MD  Calcium Carb-Cholecalciferol (CALCIUM-VITAMIN D) 500-400 MG-UNIT TABS Take 1 tablet by mouth 2 (two) times daily.     Historical Provider, MD  cholecalciferol (VITAMIN D) 1000 UNITS tablet Take 2,000 Units by mouth daily.     Historical Provider, MD  clonazePAM (KLONOPIN) 1 MG tablet Take 2 mg by mouth at bedtime.     Historical Provider, MD  gabapentin (NEURONTIN) 300 MG capsule Take 600 mg by mouth at bedtime.    Historical Provider, MD  hydrochlorothiazide (HYDRODIURIL) 25 MG tablet Take 25 mg by mouth every morning.    Historical Provider, MD  losartan (COZAAR) 100 MG tablet Take 100 mg by mouth at bedtime. 09/29/11 09/28/12  Shanker Levora Dredge, MD  metoprolol succinate (TOPROL-XL) 100 MG 24 hr tablet Take 100 mg by mouth at bedtime. Take with or immediately following a meal.    Historical Provider, MD  Multiple Vitamin (MULTIVITAMIN WITH MINERALS) TABS Take 1 tablet by mouth daily.    Historical Provider, MD  Multiple Vitamins-Minerals (PRESERVISION AREDS PO) Take 1 tablet by mouth 2 (two) times daily.     Historical Provider, MD  nitroGLYCERIN (NITROSTAT) 0.4 MG SL tablet Place 0.4 mg under the tongue every 5 (five) minutes as needed. For chest pain    Historical Provider, MD  omega-3 acid ethyl esters (LOVAZA) 1 G capsule Take 4 g by mouth 2 (two) times daily.     Historical Provider, MD  pantoprazole (PROTONIX) 40 MG tablet Take 40 mg by mouth daily. AT NIGHT    Historical Provider, MD  venlafaxine (EFFEXOR) 37.5 MG tablet Take 37.5 mg by mouth 2 (two) times daily.     Historical Provider, MD   KNEE EXAM soft tissue tenderness over medial and lateral joint lines, reduced range of motion, collateral ligaments intact, negative Lachman sign, normal ipsilateral hip exam  Physical Examination: General appearance - alert, well  appearing, and in no distress Mental status - alert, oriented to person, place, and time Chest - clear to auscultation, no wheezes, rales or rhonchi, symmetric air entry Heart - normal rate, regular rhythm, normal S1, S2, no murmurs, rubs, clicks or gallops Abdomen - soft, nontender, nondistended, no masses or organomegaly Neurological - alert, oriented, normal speech, no focal findings or movement disorder noted   Asessment/Plan---Rightt knee lateral and possible medial meniscal tear- - Plan right knee arthroscopy with meniscal debridement. Procedure risks and potential comps discussed with patient who elects to proceed. Goals are decreased pain and increased function with a high likelihood of achieving both

## 2012-09-04 ENCOUNTER — Ambulatory Visit (HOSPITAL_COMMUNITY): Payer: Medicare Other | Admitting: Anesthesiology

## 2012-09-04 ENCOUNTER — Encounter (HOSPITAL_COMMUNITY): Payer: Self-pay | Admitting: Anesthesiology

## 2012-09-04 ENCOUNTER — Ambulatory Visit (HOSPITAL_COMMUNITY)
Admission: RE | Admit: 2012-09-04 | Discharge: 2012-09-04 | Disposition: A | Discharge status: Home / Self Care | Payer: Medicare Other | Source: Ambulatory Visit | Attending: Orthopedic Surgery | Admitting: Orthopedic Surgery

## 2012-09-04 ENCOUNTER — Encounter (HOSPITAL_COMMUNITY)
Admission: RE | Disposition: A | Discharge status: Home / Self Care | Payer: Self-pay | Source: Ambulatory Visit | Attending: Orthopedic Surgery

## 2012-09-04 ENCOUNTER — Encounter (HOSPITAL_COMMUNITY): Payer: Self-pay | Admitting: *Deleted

## 2012-09-04 DIAGNOSIS — I4891 Unspecified atrial fibrillation: Secondary | ICD-10-CM | POA: Insufficient documentation

## 2012-09-04 DIAGNOSIS — E785 Hyperlipidemia, unspecified: Secondary | ICD-10-CM | POA: Insufficient documentation

## 2012-09-04 DIAGNOSIS — IMO0002 Reserved for concepts with insufficient information to code with codable children: Secondary | ICD-10-CM | POA: Insufficient documentation

## 2012-09-04 DIAGNOSIS — E7800 Pure hypercholesterolemia, unspecified: Secondary | ICD-10-CM | POA: Insufficient documentation

## 2012-09-04 DIAGNOSIS — IMO0001 Reserved for inherently not codable concepts without codable children: Secondary | ICD-10-CM | POA: Insufficient documentation

## 2012-09-04 DIAGNOSIS — X58XXXA Exposure to other specified factors, initial encounter: Secondary | ICD-10-CM | POA: Insufficient documentation

## 2012-09-04 DIAGNOSIS — K219 Gastro-esophageal reflux disease without esophagitis: Secondary | ICD-10-CM | POA: Insufficient documentation

## 2012-09-04 DIAGNOSIS — G4730 Sleep apnea, unspecified: Secondary | ICD-10-CM | POA: Insufficient documentation

## 2012-09-04 DIAGNOSIS — I2510 Atherosclerotic heart disease of native coronary artery without angina pectoris: Secondary | ICD-10-CM | POA: Insufficient documentation

## 2012-09-04 DIAGNOSIS — E119 Type 2 diabetes mellitus without complications: Secondary | ICD-10-CM | POA: Insufficient documentation

## 2012-09-04 DIAGNOSIS — Z79899 Other long term (current) drug therapy: Secondary | ICD-10-CM | POA: Insufficient documentation

## 2012-09-04 DIAGNOSIS — S83289A Other tear of lateral meniscus, current injury, unspecified knee, initial encounter: Secondary | ICD-10-CM | POA: Insufficient documentation

## 2012-09-04 DIAGNOSIS — Z7982 Long term (current) use of aspirin: Secondary | ICD-10-CM | POA: Insufficient documentation

## 2012-09-04 DIAGNOSIS — I10 Essential (primary) hypertension: Secondary | ICD-10-CM | POA: Insufficient documentation

## 2012-09-04 HISTORY — PX: KNEE ARTHROSCOPY: SHX127

## 2012-09-04 HISTORY — DX: Personal history of other diseases of the digestive system: Z87.19

## 2012-09-04 HISTORY — DX: Gastro-esophageal reflux disease without esophagitis: K21.9

## 2012-09-04 LAB — BASIC METABOLIC PANEL
BUN: 25 mg/dL — ABNORMAL HIGH (ref 6–23)
Calcium: 9.1 mg/dL (ref 8.4–10.5)
GFR calc Af Amer: >90 mL/min (ref >90)
GFR calc non Af Amer: 83 mL/min — ABNORMAL LOW (ref >90)
Potassium: 3.8 mEq/L (ref 3.5–5.1)

## 2012-09-04 LAB — CBC
HCT: 37.2 % (ref 36.0–46.0)
MCH: 30.4 pg (ref 26.0–34.0)
MCHC: 33.6 g/dL (ref 30.0–36.0)
RDW: 12.7 % (ref 11.5–15.5)

## 2012-09-04 LAB — SURGICAL PCR SCREEN
MRSA, PCR: NEGATIVE
Staphylococcus aureus: NEGATIVE

## 2012-09-04 SURGERY — ARTHROSCOPY, KNEE
Anesthesia: General | Site: Knee | Laterality: Right | Wound class: Clean

## 2012-09-04 MED ORDER — FENTANYL CITRATE 0.05 MG/ML IJ SOLN
INTRAMUSCULAR | Status: DC | PRN
  Administered 2012-09-04 (×2): 50 ug via INTRAVENOUS

## 2012-09-04 MED ORDER — SODIUM CHLORIDE 0.9 % IV SOLN
INTRAVENOUS | Status: DC
  Administered 2012-09-04: 10:00:00 via INTRAVENOUS

## 2012-09-04 MED ORDER — ACETAMINOPHEN 10 MG/ML IV SOLN: 1000.0000 mg | Freq: Once | INTRAVENOUS | Status: DC

## 2012-09-04 MED ORDER — PROMETHAZINE HCL 25 MG/ML IJ SOLN: 6.2500 mg | INTRAMUSCULAR | Status: DC | PRN

## 2012-09-04 MED ORDER — ACETAMINOPHEN 10 MG/ML IV SOLN
INTRAVENOUS | Status: DC | PRN
  Administered 2012-09-04: 1000 mg via INTRAVENOUS

## 2012-09-04 MED ORDER — LACTATED RINGERS IV SOLN: INTRAVENOUS | Status: DC

## 2012-09-04 MED ORDER — LIDOCAINE HCL (CARDIAC) 20 MG/ML IV SOLN
INTRAVENOUS | Status: DC | PRN
  Administered 2012-09-04: 60 mg via INTRAVENOUS

## 2012-09-04 MED ORDER — METHOCARBAMOL 500 MG PO TABS: 500.0000 mg | ORAL_TABLET | Freq: Four times a day (QID) | ORAL | Status: DC

## 2012-09-04 MED ORDER — CEFAZOLIN SODIUM-DEXTROSE 2-3 GM-% IV SOLR
2.0000 g | INTRAVENOUS | Status: AC
  Administered 2012-09-04: 2 g via INTRAVENOUS

## 2012-09-04 MED ORDER — LACTATED RINGERS IR SOLN
Status: DC | PRN
  Administered 2012-09-04: 3000 mL

## 2012-09-04 MED ORDER — GLYCOPYRROLATE 0.2 MG/ML IJ SOLN
INTRAMUSCULAR | Status: DC | PRN
  Administered 2012-09-04: 0.2 mg via INTRAVENOUS

## 2012-09-04 MED ORDER — MUPIROCIN 2 % EX OINT
TOPICAL_OINTMENT | Freq: Two times a day (BID) | CUTANEOUS | Status: DC
  Filled 2012-09-04: qty 22

## 2012-09-04 MED ORDER — HYDROCODONE-ACETAMINOPHEN 5-325 MG PO TABS: 1.0000 | ORAL_TABLET | Freq: Four times a day (QID) | ORAL | Status: DC | PRN

## 2012-09-04 MED ORDER — HYDROMORPHONE HCL PF 1 MG/ML IJ SOLN: 0.2500 mg | INTRAMUSCULAR | Status: DC | PRN

## 2012-09-04 MED ORDER — MIDAZOLAM HCL 5 MG/5ML IJ SOLN
INTRAMUSCULAR | Status: DC | PRN
  Administered 2012-09-04 (×2): 1 mg via INTRAVENOUS

## 2012-09-04 MED ORDER — ASPIRIN EC 81 MG PO TBEC
81.0000 mg | DELAYED_RELEASE_TABLET | Freq: Once | ORAL | Status: AC
  Administered 2012-09-04: 81 mg via ORAL
  Filled 2012-09-04: qty 1

## 2012-09-04 MED ORDER — LACTATED RINGERS IV SOLN
INTRAVENOUS | Status: DC
  Administered 2012-09-04: 1000 mL via INTRAVENOUS

## 2012-09-04 MED ORDER — ONDANSETRON HCL 4 MG/2ML IJ SOLN
INTRAMUSCULAR | Status: AC
  Filled 2012-09-04: qty 2

## 2012-09-04 MED ORDER — BUPIVACAINE-EPINEPHRINE 0.25% -1:200000 IJ SOLN
INTRAMUSCULAR | Status: DC | PRN
  Administered 2012-09-04: 50 mL

## 2012-09-04 MED ORDER — CHLORHEXIDINE GLUCONATE 4 % EX LIQD
60.0000 mL | Freq: Once | CUTANEOUS | Status: DC
  Filled 2012-09-04: qty 60

## 2012-09-04 MED ORDER — BUPIVACAINE-EPINEPHRINE 0.25% -1:200000 IJ SOLN
INTRAMUSCULAR | Status: AC
  Filled 2012-09-04: qty 1

## 2012-09-04 MED ORDER — MUPIROCIN 2 % EX OINT
TOPICAL_OINTMENT | CUTANEOUS | Status: AC
  Administered 2012-09-04: 1 via NASAL
  Filled 2012-09-04: qty 22

## 2012-09-04 MED ORDER — HYDROCODONE-ACETAMINOPHEN 5-325 MG PO TABS
1.0000 | ORAL_TABLET | Freq: Four times a day (QID) | ORAL | Status: DC | PRN
  Administered 2012-09-04: 1 via ORAL
  Filled 2012-09-04: qty 1

## 2012-09-04 MED ORDER — PROPOFOL 10 MG/ML IV BOLUS
INTRAVENOUS | Status: DC | PRN
  Administered 2012-09-04: 160 mg via INTRAVENOUS

## 2012-09-04 MED ORDER — ONDANSETRON HCL 4 MG/2ML IJ SOLN
4.0000 mg | Freq: Once | INTRAMUSCULAR | Status: AC
  Administered 2012-09-04: 4 mg via INTRAVENOUS

## 2012-09-04 SURGICAL SUPPLY — 28 items
BANDAGE ELASTIC 6 VELCRO ST LF (GAUZE/BANDAGES/DRESSINGS) ×2 IMPLANT
BLADE 4.2CUDA (BLADE) ×2 IMPLANT
CLOTH BEACON ORANGE TIMEOUT ST (SAFETY) ×2 IMPLANT
CUFF TOURN SGL QUICK 34 (TOURNIQUET CUFF) ×1
CUFF TRNQT CYL 34X4X40X1 (TOURNIQUET CUFF) ×1 IMPLANT
DRAPE U-SHAPE 47X51 STRL (DRAPES) ×2 IMPLANT
DRSG EMULSION OIL 3X3 NADH (GAUZE/BANDAGES/DRESSINGS) ×2 IMPLANT
DRSG PAD ABDOMINAL 8X10 ST (GAUZE/BANDAGES/DRESSINGS) ×2 IMPLANT
DURAPREP 26ML APPLICATOR (WOUND CARE) ×2 IMPLANT
GLOVE BIO SURGEON STRL SZ7.5 (GLOVE) ×2 IMPLANT
GLOVE BIO SURGEON STRL SZ8 (GLOVE) ×2 IMPLANT
GLOVE BIOGEL PI IND STRL 8 (GLOVE) ×1 IMPLANT
GLOVE BIOGEL PI INDICATOR 8 (GLOVE) ×1
GOWN STRL NON-REIN LRG LVL3 (GOWN DISPOSABLE) ×2 IMPLANT
MANIFOLD NEPTUNE II (INSTRUMENTS) ×2 IMPLANT
PACK ARTHROSCOPY WL (CUSTOM PROCEDURE TRAY) ×2 IMPLANT
PACK ICE MAXI GEL EZY WRAP (MISCELLANEOUS) ×6 IMPLANT
PADDING CAST ABS 6INX4YD NS (CAST SUPPLIES) ×1
PADDING CAST ABS COTTON 6X4 NS (CAST SUPPLIES) ×1 IMPLANT
PADDING CAST COTTON 6X4 STRL (CAST SUPPLIES) ×2 IMPLANT
POSITIONER SURGICAL ARM (MISCELLANEOUS) ×2 IMPLANT
SET ARTHROSCOPY TUBING (MISCELLANEOUS) ×1
SET ARTHROSCOPY TUBING LN (MISCELLANEOUS) ×1 IMPLANT
SPONGE GAUZE 4X4 12PLY (GAUZE/BANDAGES/DRESSINGS) ×2 IMPLANT
SUT ETHILON 4 0 PS 2 18 (SUTURE) ×2 IMPLANT
TOWEL OR 17X26 10 PK STRL BLUE (TOWEL DISPOSABLE) ×2 IMPLANT
WAND 90 DEG TURBOVAC W/CORD (SURGICAL WAND) ×2 IMPLANT
WRAP KNEE MAXI GEL POST OP (GAUZE/BANDAGES/DRESSINGS) ×2 IMPLANT

## 2012-09-04 NOTE — Op Note (Signed)
Preoperative diagnosis-  Right knee lateral and medial meniscal tears  Postoperative diagnosis Right- knee medial meniscal tear   Plus Right lateral meniscal tear  Procedure- Right knee arthroscopy with medial and lateral  Meniscal debridement   Surgeon- [name]. Lucrezia Dehne, MD  Anesthesia-General  EBL-  minimal Complications- None  Condition- PACU - hemodynamically stable.  Brief clinical note- -Morgan [name] is a 71 y.o.  female with a several month history of right knee pain and mechanical symptoms. Exam and history suggested medial and lateral meniscal tears confirmed by MRI. The patient presents now for arthroscopy and debridement   Procedure in detail -       After successful administration of General anesthetic, a tourmiquet is placed high on the Right  thigh and the Right lower extremity is prepped and draped in the usual sterile fashion. Time out is performed by the surgical team. Standard superomedial and inferolateral portal sites are marked and incisions made with an 11 blade. The inflow cannula is passed through the superomedial portal and camera through the inferolateral portal and inflow is initiated. Arthroscopic visualization proceeds.      The undersurface of the patella and trochlea are visualized and there is grade II chondromalacia of both with no unstable cartilage lesions. The medial and lateral gutters are visualized and there are  no loose bodies. Flexion and valgus force is applied to the knee and the medial compartment is entered. A spinal needle is passed into the joint through the site marked for the inferomedial portal. A small incision is made and the dilator passed into the joint. 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 The tear is debrided to a stable base with baskets and a shaver and sealed off with the Arthrocare.     The intercondylar notch is  visualized and the ACL appears normal. The lateral compartment is entered and the findings are tear of the body of the lateral meniscus . The tear is debrided to a stable base with baskets and a shaver and sealed off with the Arthrocare.       The joint is again inspected and there are no other tears, defects or loose bodies identified. The arthroscopic equipment is then removed from the inferior portals which are closed with interrupted 4-0 nylon. 20 ml of .25% Marcaine with epinephrine are injected through the inflow cannula and the cannula is then removed and the portal closed with nylon. The incisions are cleaned and dried and a bulky sterile dressing is applied. The patient is then awakened and transported to recovery in stable condition.   09/04/2012, 9:48 AM

## 2012-09-04 NOTE — Anesthesia Postprocedure Evaluation (Signed)
Anesthesia Post Note  Patient: Morgan [name]  Procedure(s) Performed: Procedure(s) (LRB): ARTHROSCOPY KNEE (Right)  Anesthesia type: General  Patient location: PACU  Post pain: Pain level controlled  Post assessment: Post-op Vital signs reviewed  Last Vitals:  Filed Vitals:   09/04/12 1015  BP: 156/72  Pulse: 65  Temp:   Resp: 14    Post vital signs: Reviewed  Level of consciousness: sedated  Complications: No apparent anesthesia complications

## 2012-09-04 NOTE — Progress Notes (Deleted)
Discharged via ambulatory with husband to car. She knows Dr. Jeannetta Ellis office will call her to reschedule surgery once her insurance is approved.

## 2012-09-04 NOTE — Transfer of Care (Signed)
Immediate Anesthesia Transfer of Care Note  Patient: Morgan [name]  Procedure(s) Performed: Procedure(s) (LRB) with comments: ARTHROSCOPY KNEE (Right) - right knee arthroscopy with medial and lateral meniscus debridement  Patient Location: PACU  Anesthesia Type: General  Level of Consciousness: awake, alert  and oriented  Airway & Oxygen Therapy: Patient Spontanous Breathing and Patient connected to face mask oxygen  Post-op Assessment: Report given to PACU RN and Post -op Vital signs reviewed and stable  Post vital signs: Reviewed and stable  Complications: No apparent anesthesia complications

## 2012-09-04 NOTE — Anesthesia Preprocedure Evaluation (Addendum)
Anesthesia Evaluation  Patient identified by MRN, date of birth, ID band Patient awake    Reviewed: Allergy & Precautions, H&P , NPO status , Patient's Chart, lab work & pertinent test results, reviewed documented beta blocker date and time   Airway Mallampati: II TM Distance: >3 FB Neck ROM: Full    Dental  (+) Dental Advisory Given, Teeth Intact and Poor Dentition   Pulmonary shortness of breath and with exertion, sleep apnea and Continuous Positive Airway Pressure Ventilation ,  breath sounds clear to auscultation  Pulmonary exam normal       Cardiovascular hypertension, Pt. on medications and Pt. on home beta blockers + angina + CAD, + Cardiac Stents and + CABG + dysrhythmias Atrial Fibrillation Rhythm:Regular Rate:Normal     Neuro/Psych  Headaches, PSYCHIATRIC DISORDERS Depression  Neuromuscular disease    GI/Hepatic hiatal hernia, GERD-  Medicated,  Endo/Other  diabetes, Type 2  Renal/GU      Musculoskeletal  (+) Fibromyalgia -  Abdominal   Peds  Hematology   Anesthesia Other Findings   Reproductive/Obstetrics                        Anesthesia Physical Anesthesia Plan  ASA: III  Anesthesia Plan: General   Post-op Pain Management:    Induction: Intravenous  Airway Management Planned: LMA  Additional Equipment:   Intra-op Plan:   Post-operative Plan:   Informed Consent: I have reviewed the patients History and Physical, chart, labs and discussed the procedure including the risks, benefits and alternatives for the proposed anesthesia with the patient or authorized representative who has indicated his/her understanding and acceptance.   Dental advisory given  Plan Discussed with: CRNA  Anesthesia Plan Comments:        Anesthesia Quick Evaluation

## 2012-09-04 NOTE — Interval H&P Note (Signed)
History and Physical Interval Note:  09/04/2012 7:25 AM  Morgan [name]  has presented today for surgery, with the diagnosis of Right Knee Lateral Meniscal Tear  The various methods of treatment have been discussed with the patient and family. After consideration of risks, benefits and other options for treatment, the patient has consented to  Procedure(s) (LRB) with comments: ARTHROSCOPY KNEE (Right) - with Debridement as a surgical intervention .  The patient's history has been reviewed, patient examined, no change in status, stable for surgery.  I have reviewed the patient's chart and labs.  Questions were answered to the patient's satisfaction.     Loanne Drilling

## 2012-09-04 NOTE — Progress Notes (Signed)
Ortho tech here with crutches and to give pt instructions

## 2012-09-06 ENCOUNTER — Encounter (HOSPITAL_COMMUNITY): Payer: Self-pay | Admitting: Orthopedic Surgery

## 2012-10-14 ENCOUNTER — Emergency Department: Payer: Self-pay | Admitting: Emergency Medicine

## 2012-10-14 LAB — CBC WITH DIFFERENTIAL/PLATELET
Basophil #: 0 10*3/uL (ref 0.0–0.1)
Eosinophil %: 2.2 %
Lymphocyte #: 3 10*3/uL (ref 1.0–3.6)
MCH: 30.7 pg (ref 26.0–34.0)
MCHC: 33.7 g/dL (ref 32.0–36.0)
MCV: 91 fL (ref 80–100)
Monocyte #: 1.2 x10 3/mm — ABNORMAL HIGH (ref 0.2–0.9)
Monocyte %: 11.6 %
Neutrophil #: 5.7 10*3/uL (ref 1.4–6.5)
Neutrophil %: 56.5 %
Platelet: 232 10*3/uL (ref 150–440)
RBC: 4.29 10*6/uL (ref 3.80–5.20)
WBC: 10.1 10*3/uL (ref 3.6–11.0)

## 2012-10-14 LAB — BASIC METABOLIC PANEL
Anion Gap: 8 (ref 7–16)
BUN: 26 mg/dL — ABNORMAL HIGH (ref 7–18)
Chloride: 103 mmol/L (ref 98–107)
Co2: 30 mmol/L (ref 21–32)
EGFR (Non-African Amer.): >60
Osmolality: 290 (ref 275–301)

## 2012-10-22 ENCOUNTER — Encounter: Payer: Self-pay | Admitting: Orthopedic Surgery

## 2012-11-13 ENCOUNTER — Encounter: Payer: Self-pay | Admitting: Orthopedic Surgery

## 2012-11-13 DIAGNOSIS — I639 Cerebral infarction, unspecified: Secondary | ICD-10-CM

## 2012-11-13 HISTORY — DX: Cerebral infarction, unspecified: I63.9

## 2012-12-14 ENCOUNTER — Encounter: Payer: Self-pay | Admitting: Orthopedic Surgery

## 2013-01-11 ENCOUNTER — Encounter: Payer: Self-pay | Admitting: Orthopedic Surgery

## 2013-02-11 ENCOUNTER — Encounter: Payer: Self-pay | Admitting: Orthopedic Surgery

## 2013-04-01 ENCOUNTER — Ambulatory Visit: Payer: Self-pay

## 2013-04-30 ENCOUNTER — Telehealth: Payer: Self-pay | Admitting: Family Medicine

## 2013-04-30 NOTE — Telephone Encounter (Signed)
Patient is calling to schedule a new patient appointment.  Her husband,Joseph,is a patient of yours.  Patient currently is seeing a [facility] in [place].  Her time with her doctor runs out in June and then she has to pay for another year.  She is due for lab work in July to check her sugar.  Patient wants to know if you would fit her in July or early August for the appointment.  Please advise.

## 2013-05-01 NOTE — Telephone Encounter (Signed)
Yes, we can work her in in any 30 min slot.

## 2013-05-09 ENCOUNTER — Ambulatory Visit (INDEPENDENT_AMBULATORY_CARE_PROVIDER_SITE_OTHER): Payer: Medicare Other | Admitting: Family Medicine

## 2013-05-09 ENCOUNTER — Encounter: Payer: Self-pay | Admitting: Family Medicine

## 2013-05-09 VITALS — BP 118/70 | HR 65 | Temp 97.9°F | Ht 63.0 in | Wt 190.5 lb

## 2013-05-09 DIAGNOSIS — T783XXS Angioneurotic edema, sequela: Secondary | ICD-10-CM

## 2013-05-09 DIAGNOSIS — M545 Low back pain, unspecified: Secondary | ICD-10-CM

## 2013-05-09 DIAGNOSIS — I10 Essential (primary) hypertension: Secondary | ICD-10-CM

## 2013-05-09 DIAGNOSIS — T783XXA Angioneurotic edema, initial encounter: Secondary | ICD-10-CM | POA: Insufficient documentation

## 2013-05-09 DIAGNOSIS — E7800 Pure hypercholesterolemia, unspecified: Secondary | ICD-10-CM

## 2013-05-09 DIAGNOSIS — G4733 Obstructive sleep apnea (adult) (pediatric): Secondary | ICD-10-CM

## 2013-05-09 DIAGNOSIS — K449 Diaphragmatic hernia without obstruction or gangrene: Secondary | ICD-10-CM | POA: Insufficient documentation

## 2013-05-09 DIAGNOSIS — G8929 Other chronic pain: Secondary | ICD-10-CM | POA: Insufficient documentation

## 2013-05-09 DIAGNOSIS — E119 Type 2 diabetes mellitus without complications: Secondary | ICD-10-CM

## 2013-05-09 DIAGNOSIS — E785 Hyperlipidemia, unspecified: Secondary | ICD-10-CM

## 2013-05-09 DIAGNOSIS — F324 Major depressive disorder, single episode, in partial remission: Secondary | ICD-10-CM | POA: Insufficient documentation

## 2013-05-09 DIAGNOSIS — E1159 Type 2 diabetes mellitus with other circulatory complications: Secondary | ICD-10-CM | POA: Insufficient documentation

## 2013-05-09 DIAGNOSIS — J309 Allergic rhinitis, unspecified: Secondary | ICD-10-CM

## 2013-05-09 DIAGNOSIS — G2581 Restless legs syndrome: Secondary | ICD-10-CM | POA: Insufficient documentation

## 2013-05-09 DIAGNOSIS — F331 Major depressive disorder, recurrent, moderate: Secondary | ICD-10-CM | POA: Insufficient documentation

## 2013-05-09 DIAGNOSIS — T788XXS Other adverse effects, not elsewhere classified, sequela: Secondary | ICD-10-CM

## 2013-05-09 DIAGNOSIS — Z800 Family history of malignant neoplasm of digestive organs: Secondary | ICD-10-CM

## 2013-05-09 DIAGNOSIS — S83281D Other tear of lateral meniscus, current injury, right knee, subsequent encounter: Secondary | ICD-10-CM

## 2013-05-09 DIAGNOSIS — F329 Major depressive disorder, single episode, unspecified: Secondary | ICD-10-CM

## 2013-05-09 DIAGNOSIS — Z5189 Encounter for other specified aftercare: Secondary | ICD-10-CM

## 2013-05-09 DIAGNOSIS — M797 Fibromyalgia: Secondary | ICD-10-CM

## 2013-05-09 DIAGNOSIS — K219 Gastro-esophageal reflux disease without esophagitis: Secondary | ICD-10-CM

## 2013-05-09 DIAGNOSIS — IMO0001 Reserved for inherently not codable concepts without codable children: Secondary | ICD-10-CM

## 2013-05-09 DIAGNOSIS — F32A Depression, unspecified: Secondary | ICD-10-CM

## 2013-05-09 DIAGNOSIS — E1169 Type 2 diabetes mellitus with other specified complication: Secondary | ICD-10-CM | POA: Insufficient documentation

## 2013-05-09 DIAGNOSIS — I2510 Atherosclerotic heart disease of native coronary artery without angina pectoris: Secondary | ICD-10-CM

## 2013-05-09 NOTE — Assessment & Plan Note (Signed)
Well controlled. Continue current medication.  

## 2013-05-09 NOTE — Patient Instructions (Addendum)
Work on healthy eating habits, weight loss and exercise.  It was nice to meet you!

## 2013-05-09 NOTE — Assessment & Plan Note (Signed)
Due for re-eval on new medicine metformin in 06/2013.

## 2013-05-09 NOTE — Assessment & Plan Note (Signed)
On zyrtec.   

## 2013-05-09 NOTE — Assessment & Plan Note (Signed)
Scheduled for upcoming right knee replacement.

## 2013-05-09 NOTE — Progress Notes (Signed)
Subjective:    Patient ID: Morgan [name], female    DOB: Mar 25, 1941, 72 y.o.   MRN: [id number]  HPI 72 year old female here to establish.  She has been seeing Dr. Nicholes Mango, Internal med.  CAD is followed by Cardiology and is stable. Last OV 02/2013, followed every 6 months.  On ASA.  HTN, well controlled on  toprol XL and HCTZ.  Had swelling in knee with cozaar, stopped  Cozaar4/2014. Bp has been stable off this medication.  She has an upcoming knee replacement with Dr.Allusio, scheduled 07/07/2013. She is off plavix.  She may also need upcoming surgery on her back for bulging disc causing sciatic pain. Followed by Dr. Shon Baton.   GERD: well controlled on pantoprazole twice a day.   Fibromyalgia, controlled on  Gabapentin, venlafaxine.   RLS, on robaxin  Depression, stable on venlafaxine  Uses clonazepam 1-2 tabs as needed for sleep, usually 2 tabs.  Diabetes: Moderate control, recent diagnosis Last A1C 7.2, on metformin 500 mg daily started in 04/09/2013. No SE. Blood Sugars averaging: Not checking.  Elevated Cholesterol: 04/09/2013 LDL at goal  < 70 at 58, HDL  56 on lipitor 80 mg daily Using medications without problems:None Muscle aches: None Diet compliance: Moderate Exercise: Limited due to knee and back, trys to stay active. Other complaints:  Last CPX wellness: in 04/09/2013.  Review of Systems  Constitutional: Negative for fever, fatigue and unexpected weight change.  HENT: Negative for ear pain, congestion, sore throat, sneezing, trouble swallowing and sinus pressure.   Eyes: Negative for pain and itching.  Respiratory: Negative for cough, shortness of breath and wheezing.   Cardiovascular: Negative for chest pain, palpitations and leg swelling.  Gastrointestinal: Negative for nausea, abdominal pain, diarrhea, constipation and blood in stool.  Genitourinary: Negative for dysuria, hematuria, vaginal discharge, difficulty urinating and menstrual problem.       Occ  urge incontinence  Skin: Negative for rash.  Neurological: Negative for syncope, weakness, light-headedness, numbness and headaches.  Psychiatric/Behavioral: Negative for confusion and dysphoric mood. The patient is not nervous/anxious.        Objective:   Physical Exam  Constitutional: Vital signs are normal. She appears well-developed and well-nourished. She is cooperative.  Non-toxic appearance. She does not appear ill. No distress.  HENT:  Head: Normocephalic.  Right Ear: Hearing, tympanic membrane, external ear and ear canal normal.  Left Ear: Hearing, tympanic membrane, external ear and ear canal normal.  Nose: Nose normal.  Eyes: Conjunctivae, EOM and lids are normal. Pupils are equal, round, and reactive to light. No foreign bodies found.  Neck: Trachea normal and normal range of motion. Neck supple. Carotid bruit is not present. No mass and no thyromegaly present.  Cardiovascular: Normal rate, regular rhythm, S1 normal, S2 normal, normal heart sounds and intact distal pulses.  Exam reveals no gallop.   No murmur heard. Pulmonary/Chest: Effort normal and breath sounds normal. No respiratory distress. She has no wheezes. She has no rhonchi. She has no rales.  Abdominal: Soft. Normal appearance and bowel sounds are normal. She exhibits no distension, no fluid wave, no abdominal bruit and no mass. There is no hepatosplenomegaly. There is no tenderness. There is no rebound, no guarding and no CVA tenderness. No hernia.  Lymphadenopathy:    She has no cervical adenopathy.    She has no axillary adenopathy.  Neurological: She is alert. She has normal strength. No cranial nerve deficit or sensory deficit.  Skin: Skin is warm, dry and intact. No  rash noted.  Psychiatric: Her speech is normal and behavior is normal. Judgment normal. Her mood appears not anxious. Cognition and memory are normal. She does not exhibit a depressed mood.          Assessment & Plan:

## 2013-05-20 ENCOUNTER — Encounter: Payer: Self-pay | Admitting: Family Medicine

## 2013-06-21 ENCOUNTER — Other Ambulatory Visit: Payer: Self-pay | Admitting: Orthopedic Surgery

## 2013-06-21 NOTE — Progress Notes (Signed)
Preoperative surgical orders have been place into the Epic hospital system for [facility] on 06/21/2013, 10:02 AM  by Patrica Duel for surgery on 07/07/13.  Preop Total Knee orders including Experal, PO Tylenol, and IV Decadron as long as there are no contraindications to the above medications. Avel Peace, PA-C

## 2013-06-23 ENCOUNTER — Other Ambulatory Visit: Payer: Self-pay | Admitting: Family Medicine

## 2013-06-23 NOTE — Telephone Encounter (Signed)
OK to refill

## 2013-06-24 ENCOUNTER — Other Ambulatory Visit (INDEPENDENT_AMBULATORY_CARE_PROVIDER_SITE_OTHER): Payer: Medicare Other

## 2013-06-24 ENCOUNTER — Telehealth: Payer: Self-pay | Admitting: Family Medicine

## 2013-06-24 DIAGNOSIS — E785 Hyperlipidemia, unspecified: Secondary | ICD-10-CM

## 2013-06-24 DIAGNOSIS — E119 Type 2 diabetes mellitus without complications: Secondary | ICD-10-CM

## 2013-06-24 LAB — COMPREHENSIVE METABOLIC PANEL
AST: 27 U/L (ref 0–37)
Alkaline Phosphatase: 82 U/L (ref 39–117)
BUN: 21 mg/dL (ref 6–23)
Calcium: 9.6 mg/dL (ref 8.4–10.5)
Chloride: 100 mEq/L (ref 96–112)
Creatinine, Ser: 1 mg/dL (ref 0.4–1.2)
Total Bilirubin: 0.9 mg/dL (ref 0.3–1.2)

## 2013-06-24 LAB — LIPID PANEL
HDL: 48 mg/dL (ref >39.00)
Total CHOL/HDL Ratio: 3
Triglycerides: 115 mg/dL (ref 0.0–149.0)

## 2013-06-24 NOTE — Telephone Encounter (Signed)
Message copied by Excell Seltzer on Tue Jun 24, 2013  8:26 AM ------      Message from: Alvina Chou      Created: Wed Jun 18, 2013 12:56 PM      Regarding: Lab orders for Tuesday, 8.12.14       Lab orders, Thanks ------

## 2013-06-25 ENCOUNTER — Encounter (HOSPITAL_COMMUNITY): Payer: Self-pay | Admitting: Pharmacy Technician

## 2013-06-25 NOTE — Telephone Encounter (Signed)
Refill called to [facility]. 

## 2013-06-30 NOTE — Patient Instructions (Signed)
Adella Cude  06/30/2013   Your procedure is scheduled on:  07/07/13               Surgery 1240pm-130pm  Report to [facility] at    0940  AM.  Call this number if you have problems the morning of surgery: [phone]   Remember:   Do not eat food or drink liquids after midnight.   Take these medicines the morning of surgery with A SIP OF WATER:    Do not wear jewelry, make-up or nail polish.  Do not wear lotions, powders, or perfumes.   Do not shave 48 hours prior to surgery.   Do not bring valuables to the hospital.  Contacts, dentures or bridgework may not be worn into surgery.  Leave suitcase in the car. After surgery it may be brought to your room.  For patients admitted to the hospital, checkout time is 11:00 AM the day of  discharge.       SEE CHG INSTRUCTION SHEET    Please read over the following fact sheets that you were given: MRSA Information, coughing and deep breathing exercises, leg exercises, Blood Transfusion Fact Sheet , Incentive Spirometry Fact Sheet                Failure to comply with these instructions may result in cancellation of your surgery.                Patient Signature ____________________________              Nurse Signature _____________________________

## 2013-07-01 ENCOUNTER — Encounter (HOSPITAL_COMMUNITY): Payer: Self-pay

## 2013-07-01 ENCOUNTER — Ambulatory Visit (INDEPENDENT_AMBULATORY_CARE_PROVIDER_SITE_OTHER): Payer: Medicare Other | Admitting: Family Medicine

## 2013-07-01 ENCOUNTER — Encounter (HOSPITAL_COMMUNITY)
Admission: RE | Admit: 2013-07-01 | Discharge: 2013-07-01 | Disposition: A | Discharge status: Home / Self Care | Payer: Medicare Other | Source: Ambulatory Visit | Attending: Orthopedic Surgery | Admitting: Orthopedic Surgery

## 2013-07-01 ENCOUNTER — Ambulatory Visit (HOSPITAL_COMMUNITY)
Admission: RE | Admit: 2013-07-01 | Discharge: 2013-07-01 | Disposition: A | Discharge status: Home / Self Care | Payer: Medicare Other | Source: Ambulatory Visit | Attending: Orthopedic Surgery | Admitting: Orthopedic Surgery

## 2013-07-01 ENCOUNTER — Encounter: Payer: Self-pay | Admitting: Family Medicine

## 2013-07-01 VITALS — BP 124/64 | HR 60 | Temp 98.4°F | Ht 63.0 in | Wt 187.8 lb

## 2013-07-01 DIAGNOSIS — E119 Type 2 diabetes mellitus without complications: Secondary | ICD-10-CM

## 2013-07-01 DIAGNOSIS — M1710 Unilateral primary osteoarthritis, unspecified knee: Secondary | ICD-10-CM | POA: Insufficient documentation

## 2013-07-01 DIAGNOSIS — I10 Essential (primary) hypertension: Secondary | ICD-10-CM | POA: Insufficient documentation

## 2013-07-01 DIAGNOSIS — Z01812 Encounter for preprocedural laboratory examination: Secondary | ICD-10-CM | POA: Insufficient documentation

## 2013-07-01 DIAGNOSIS — E7800 Pure hypercholesterolemia, unspecified: Secondary | ICD-10-CM

## 2013-07-01 DIAGNOSIS — Z01818 Encounter for other preprocedural examination: Secondary | ICD-10-CM | POA: Insufficient documentation

## 2013-07-01 HISTORY — DX: Nausea with vomiting, unspecified: R11.2

## 2013-07-01 HISTORY — DX: Reserved for concepts with insufficient information to code with codable children: IMO0002

## 2013-07-01 HISTORY — DX: Other complications of anesthesia, initial encounter: T88.59XA

## 2013-07-01 HISTORY — DX: Other specified postprocedural states: Z98.890

## 2013-07-01 HISTORY — DX: Adverse effect of unspecified anesthetic, initial encounter: T41.45XA

## 2013-07-01 HISTORY — DX: Unspecified osteoarthritis, unspecified site: M19.90

## 2013-07-01 LAB — URINE MICROSCOPIC-ADD ON

## 2013-07-01 LAB — SURGICAL PCR SCREEN: Staphylococcus aureus: NEGATIVE

## 2013-07-01 LAB — URINALYSIS, ROUTINE W REFLEX MICROSCOPIC
Bilirubin Urine: NEGATIVE
Hgb urine dipstick: NEGATIVE
Ketones, ur: NEGATIVE mg/dL
Nitrite: NEGATIVE
Protein, ur: NEGATIVE mg/dL
Specific Gravity, Urine: 1.022 (ref 1.005–1.030)
Urobilinogen, UA: 1 mg/dL (ref 0.0–1.0)

## 2013-07-01 LAB — APTT: aPTT: 27 seconds (ref 24–37)

## 2013-07-01 LAB — COMPREHENSIVE METABOLIC PANEL
Albumin: 3.5 g/dL (ref 3.5–5.2)
Alkaline Phosphatase: 96 U/L (ref 39–117)
BUN: 21 mg/dL (ref 6–23)
Calcium: 10.1 mg/dL (ref 8.4–10.5)
GFR calc Af Amer: 79 mL/min — ABNORMAL LOW (ref >90)
Glucose, Bld: 167 mg/dL — ABNORMAL HIGH (ref 70–99)
Potassium: 3.4 mEq/L — ABNORMAL LOW (ref 3.5–5.1)
Sodium: 140 mEq/L (ref 135–145)
Total Protein: 6.8 g/dL (ref 6.0–8.3)

## 2013-07-01 LAB — CBC
HCT: 38.7 % (ref 36.0–46.0)
MCH: 29.7 pg (ref 26.0–34.0)
MCHC: 32.8 g/dL (ref 30.0–36.0)
RDW: 12.9 % (ref 11.5–15.5)

## 2013-07-01 LAB — PROTIME-INR: Prothrombin Time: 13.1 seconds (ref 11.6–15.2)

## 2013-07-01 MED ORDER — METFORMIN HCL ER 750 MG PO TB24: 750.0000 mg | ORAL_TABLET | Freq: Every day | ORAL | Status: DC

## 2013-07-01 NOTE — Patient Instructions (Addendum)
Increase metformin to 750 mg daily. Work on regular exercise when able, weight loss and low carbohydrate diet. Follow up in 3 months with labs prior.

## 2013-07-01 NOTE — Assessment & Plan Note (Signed)
Well controlled. Continue current medication.  

## 2013-07-01 NOTE — Progress Notes (Signed)
  Subjective:    Patient ID: Morgan [name], female    DOB: Nov 27, 1940, 72 y.o.   MRN: [id number]  HPI 72 year old female with planned TKR on right on 8/25 returns for follow up.   Diabetes: Inadequate control on metformin 500 mg daily. Forgets to take 2-3 days a week. Lab Results  Component Value Date   HGBA1C 8.4* 06/24/2013  Using medications without difficulties:None Hypoglycemic episodes:None Hyperglycemic episodes:None Feet problems:None Blood Sugars averaging:Not checking. eye exam within last year:  Elevated Cholesterol:At goal <70 ( hx of CAD) on lipitor 80 mg daily Lab Results  Component Value Date   CHOL 132 06/24/2013   HDL 48.00 06/24/2013   LDLCALC 61 06/24/2013   TRIG 115.0 06/24/2013   CHOLHDL 3 06/24/2013  Using medications without problems:None Muscle aches: None Diet compliance:Moderate. Exercise:Minimal... She has been moving though so staying active. Other complaints:  Hypertension:  At goal on metoprolol and HCTZ.  Using medication without problems or lightheadedness: None Chest pain with exertion:None Edema:None Short of breath:None Average home BPs:  < 130/80 Other issues:    Review of Systems  Constitutional: Negative for fever and fatigue.  HENT: Negative for ear pain.   Eyes: Negative for pain.  Respiratory: Negative for chest tightness and shortness of breath.   Cardiovascular: Negative for chest pain, palpitations and leg swelling.  Gastrointestinal: Negative for abdominal pain.  Genitourinary: Negative for dysuria.       Objective:   Physical Exam  Constitutional: Vital signs are normal. She appears well-developed and well-nourished. She is cooperative.  Non-toxic appearance. She does not appear ill. No distress.  HENT:  Head: Normocephalic.  Right Ear: Hearing, tympanic membrane, external ear and ear canal normal. Tympanic membrane is not erythematous, not retracted and not bulging.  Left Ear: Hearing, tympanic membrane, external ear  and ear canal normal. Tympanic membrane is not erythematous, not retracted and not bulging.  Nose: No mucosal edema or rhinorrhea. Right sinus exhibits no maxillary sinus tenderness and no frontal sinus tenderness. Left sinus exhibits no maxillary sinus tenderness and no frontal sinus tenderness.  Mouth/Throat: Uvula is midline, oropharynx is clear and moist and mucous membranes are normal.  Eyes: Conjunctivae, EOM and lids are normal. Pupils are equal, round, and reactive to light. Lids are everted and swept, no foreign bodies found.  Neck: Trachea normal and normal range of motion. Neck supple. Carotid bruit is not present. No mass and no thyromegaly present.  Cardiovascular: Normal rate, regular rhythm, S1 normal, S2 normal, normal heart sounds, intact distal pulses and normal pulses.  Exam reveals no gallop and no friction rub.   No murmur heard. Pulmonary/Chest: Effort normal and breath sounds normal. Not tachypneic. No respiratory distress. She has no decreased breath sounds. She has no wheezes. She has no rhonchi. She has no rales.  Abdominal: Soft. Normal appearance and bowel sounds are normal. There is no tenderness.  Neurological: She is alert.  Skin: Skin is warm, dry and intact. No rash noted.  Psychiatric: Her speech is normal and behavior is normal. Judgment and thought content normal. Her mood appears not anxious. Cognition and memory are normal. She does not exhibit a depressed mood.   Diabetic foot exam: Normal inspection No skin breakdown No calluses  Normal DP pulses Normal sensation to light touch and monofilament Nails normal         Assessment & Plan:

## 2013-07-01 NOTE — Assessment & Plan Note (Signed)
Worsened control. Encouraged exercise, weight loss, healthy eating habits. Increase metformin.. Some limitation in dose due to GFR.  Follow up in 3 months.

## 2013-07-01 NOTE — Progress Notes (Signed)
03/26/13 Dr Anne Fu office visit note on chart 03/12/13 Dr Armanda Magic note on chart  02/05/12 Stress Test on chart  ECHO 12/01/09 on chart  01/29/13 EKG on chart  Sleep Study on chart from 11/25/2009 along with Compliance Summary from 02/11/12-03/12/13 on chart

## 2013-07-06 ENCOUNTER — Other Ambulatory Visit: Payer: Self-pay | Admitting: Orthopedic Surgery

## 2013-07-06 NOTE — H&P (Signed)
Morgan [name]  DOB: 08-16-1941 Married / Language: English / Race: White Female  Date of Admission:  07/07/2013  Chief Complaint:  Right Knee Pain  History of Present Illness The patient is a 72 year old female who comes in today for a preoperative History and Physical. The patient is scheduled for a right total knee arthroplasty to be performed by Dr. [name]. Aluisio, MD at [facility] on 07/07/2013. The patient is a 72 year old female who presents for follow up of their knee. The patient is being followed for their right knee pain. They are now 7 month(s) out from surgery (knee arthroscopy). Symptoms reported today include: pain, swelling and instability. The patient feels that they are doing poorly and report their pain level to be moderate. The patient presents today following MRI. The patient has not gotten any relief of their symptoms with viscosupplementation. Unfortunately she continues with worsening pain in the right knee. Last time we saw her we decided to get an MRI as she was not completely bone on bone and was still having considerable symptoms even after her visco supplements. Unfortunately she has not gotten better with any of the previous interventions. An arthroscopy now is going to be highly unpredictable. I told her it would be about a 50% chance of her getting better but also a 50% chance of her getting worse. She is not interested in that. I told her the most predictable means of getting her better long term would be a total knee arthroplasty. Even if she had a successful arthroscopy eventually she would need the total knee. She just wants to go ahead and get the total knee done. We did discuss procedure, risks, potential complications, rehab course in detail today and she would like to proceed. She is now ready to proceed. They have been treated conservatively in the past for the above stated problem and despite conservative measures, they continue to have  progressive pain and severe functional limitations and dysfunction. They have failed non-operative management including home exercise, medications, and injections. It is felt that they would benefit from undergoing total joint replacement. Risks and benefits of the procedure have been discussed with the patient and they elect to proceed with surgery. There are no active contraindications to surgery such as ongoing infection or rapidly progressive neurological disease.  Problem List Primary osteoarthritis of one knee (715.16)   Allergies Percocet *ANALGESICS - OPIOID* Sulfa Drugs PriLOSEC *ULCER DRUGS*   Family History Diabetes Mellitus. sister Cancer. mother and sister Heart Disease. father and brother Hypertension. sister, brother and child Heart disease in female family member before age 44   Social History Tobacco use. never smoker Exercise. Exercises rarely; does other Drug/Alcohol Rehab (Previously). no Illicit drug use. no Marital status. married Living situation. live with spouse Alcohol use. current drinker; drinks wine; only occasionally per week Children. 3 Drug/Alcohol Rehab (Currently). no Current work status. retired Number of flights of stairs before winded. 1 Tobacco / smoke exposure. no Pain Contract. no   Medication History MetFORMIN HCl ( Oral) Specific dose unknown - Active. ClonazePAM (1MG  Tablet, Oral) Active. Effexor XR (37.5MG  Capsule ER 24HR, Oral) Active. Calcium D-Glucarate (500MG  Capsule, Oral) Active. Multiple Vitamin ( Oral) Active. PreserVision AREDS ( Oral) Active. Vitamin D (1000UNIT Tablet, Oral) Active. Omega 3 ( Oral) Specific dose unknown - Active. Nitroglycerin (0.4MG  Tab Sublingual, Sublingual) Active. Aspirin EC Low Strength (81MG  Tablet DR, Oral) Active. Hydrochlorothiazide (25MG  Tablet, Oral) Active. Toprol XL (100MG  Tablet ER 24HR, Oral)  Active. Pantoprazole Sodium (40MG  Tablet DR, Oral) Active. Epi E-Z Pen  (1:1000 Device, Injection) Active. ZyrTEC Allergy (10MG  Tablet, Oral) Active.  Past Surgical History Straighten Nasal Septum Heart Stents. Date: 2012. Valve Replacement. replaced: unknown valve, had three valves replaced during procedure Colon Polyp Removal - Colonoscopy Coronary Artery Bypass Graft. 3 vessels Colon Polyp Removal - Open   Medical History Gastroesophageal Reflux Disease Fibromyalgia High blood pressure Sleep Apnea. uses CPAP Rheumatoid Arthritis Depression Coronary artery disease Chronic Pain   Review of Systems General:Not Present- Chills, Fever, Night Sweats, Fatigue, Weight Gain, Weight Loss and Memory Loss. Skin:Not Present- Hives, Itching, Rash, Eczema and Lesions. HEENT:Not Present- Tinnitus, Headache, Double Vision, Visual Loss, Hearing Loss and Dentures. Respiratory:Not Present- Shortness of breath with exertion, Shortness of breath at rest, Allergies, Coughing up blood and Chronic Cough. Cardiovascular:Not Present- Chest Pain, Racing/skipping heartbeats, Difficulty Breathing Lying Down, Murmur, Swelling and Palpitations. Gastrointestinal:Not Present- Bloody Stool, Heartburn, Abdominal Pain, Vomiting, Nausea, Constipation, Diarrhea, Difficulty Swallowing, Jaundice and Loss of appetitie. Female Genitourinary:Not Present- Blood in Urine, Urinary frequency, Weak urinary stream, Discharge, Flank Pain, Incontinence, Painful Urination, Urgency, Urinary Retention and Urinating at Night. Musculoskeletal:Not Present- Muscle Weakness, Muscle Pain, Joint Swelling, Joint Pain, Back Pain, Morning Stiffness and Spasms. Neurological:Not Present- Tremor, Dizziness, Blackout spells, Paralysis, Difficulty with balance and Weakness. Psychiatric:Not Present- Insomnia.   Vitals Pulse: 64 (Regular) Resp.: 14 (Unlabored) BP: 138/70 (Sitting, Left Arm, Standard)    Physical Exam The physical exam findings are as follows:  Note: Patient is a 72  year old female with continued knee pain.   General Mental Status - Alert, cooperative and good historian. General Appearance- pleasant. Not in acute distress. Orientation- Oriented X3. Build & Nutrition- Well nourished and Well developed.   Head and Neck Head- normocephalic, atraumatic . Neck Global Assessment- supple. no bruit auscultated on the right and no bruit auscultated on the left.   Eye Vision- Wears corrective lenses. Pupil- Bilateral- Regular and Round. Motion- Bilateral- EOMI.   ENMT upper denture plate  Chest and Lung Exam Auscultation: Breath sounds:- clear at anterior chest wall and - clear at posterior chest wall. Adventitious sounds:- No Adventitious sounds.   Cardiovascular Auscultation:Rhythm- Regular rate and rhythm. Heart Sounds- S1 WNL and S2 WNL. Murmurs & Other Heart Sounds:Auscultation of the heart reveals - No Murmurs.   Abdomen Palpation/Percussion:Tenderness- Abdomen is non-tender to palpation. Rigidity (guarding)- Abdomen is soft. Auscultation:Auscultation of the abdomen reveals - Bowel sounds normal.   Female Genitourinary Not done, not pertinent to present illness  Musculoskeletal On exam she is alert and oriented in no apparent distress. Her right knee shows a moderate sized effusion. Range about 5 to 130. There is moderate crepitus on range of motion. There is tenderness lateral greater than medial with no instability noted.  RADIOGRAPHS: AP both knees and lateral and she has medial joint space narrowing but not bone on bone.  Assessment & Plan Primary osteoarthritis of one knee (715.16) Impression: Right Knee  Note: Plan is for a Right Total Knee Replacement by Dr. Lequita Halt.  Plan is to go home.  PCP - Dr. Danley Danker, [place], [place]  The patient will not receive TXA (tranexamic acid) due to: Coronary Arterial Disease  Time spent ~ 20 minutes  Signed electronically by Lauraine Rinne, III  PA-C

## 2013-07-07 ENCOUNTER — Encounter (HOSPITAL_COMMUNITY): Payer: Self-pay | Admitting: *Deleted

## 2013-07-07 ENCOUNTER — Inpatient Hospital Stay (HOSPITAL_COMMUNITY): Payer: Medicare Other | Admitting: Anesthesiology

## 2013-07-07 ENCOUNTER — Encounter (HOSPITAL_COMMUNITY)
Admission: RE | Disposition: A | Discharge status: Home Health | Payer: Self-pay | Source: Ambulatory Visit | Attending: Orthopedic Surgery

## 2013-07-07 ENCOUNTER — Inpatient Hospital Stay (HOSPITAL_COMMUNITY)
Admission: RE | Admit: 2013-07-07 | Discharge: 2013-07-09 | DRG: 470 | Disposition: A | Discharge status: Home Health | Payer: Medicare Other | Source: Ambulatory Visit | Attending: Orthopedic Surgery | Admitting: Orthopedic Surgery

## 2013-07-07 ENCOUNTER — Encounter (HOSPITAL_COMMUNITY): Payer: Self-pay | Admitting: Anesthesiology

## 2013-07-07 DIAGNOSIS — G4730 Sleep apnea, unspecified: Secondary | ICD-10-CM | POA: Diagnosis present

## 2013-07-07 DIAGNOSIS — Z954 Presence of other heart-valve replacement: Secondary | ICD-10-CM

## 2013-07-07 DIAGNOSIS — M1710 Unilateral primary osteoarthritis, unspecified knee: Secondary | ICD-10-CM | POA: Diagnosis present

## 2013-07-07 DIAGNOSIS — I10 Essential (primary) hypertension: Secondary | ICD-10-CM | POA: Diagnosis present

## 2013-07-07 DIAGNOSIS — Z951 Presence of aortocoronary bypass graft: Secondary | ICD-10-CM

## 2013-07-07 DIAGNOSIS — K219 Gastro-esophageal reflux disease without esophagitis: Secondary | ICD-10-CM | POA: Diagnosis present

## 2013-07-07 DIAGNOSIS — W010XXA Fall on same level from slipping, tripping and stumbling without subsequent striking against object, initial encounter: Secondary | ICD-10-CM | POA: Diagnosis not present

## 2013-07-07 DIAGNOSIS — Z96651 Presence of right artificial knee joint: Secondary | ICD-10-CM

## 2013-07-07 DIAGNOSIS — M069 Rheumatoid arthritis, unspecified: Secondary | ICD-10-CM | POA: Diagnosis present

## 2013-07-07 DIAGNOSIS — F329 Major depressive disorder, single episode, unspecified: Secondary | ICD-10-CM | POA: Diagnosis present

## 2013-07-07 DIAGNOSIS — G8929 Other chronic pain: Secondary | ICD-10-CM | POA: Diagnosis present

## 2013-07-07 DIAGNOSIS — Y9210 Unspecified residential institution as the place of occurrence of the external cause: Secondary | ICD-10-CM | POA: Diagnosis not present

## 2013-07-07 DIAGNOSIS — F3289 Other specified depressive episodes: Secondary | ICD-10-CM | POA: Diagnosis present

## 2013-07-07 DIAGNOSIS — D62 Acute posthemorrhagic anemia: Secondary | ICD-10-CM | POA: Diagnosis not present

## 2013-07-07 DIAGNOSIS — IMO0001 Reserved for inherently not codable concepts without codable children: Secondary | ICD-10-CM | POA: Diagnosis present

## 2013-07-07 DIAGNOSIS — Z9861 Coronary angioplasty status: Secondary | ICD-10-CM

## 2013-07-07 DIAGNOSIS — I2510 Atherosclerotic heart disease of native coronary artery without angina pectoris: Secondary | ICD-10-CM | POA: Diagnosis present

## 2013-07-07 HISTORY — PX: TOTAL KNEE ARTHROPLASTY: SHX125

## 2013-07-07 LAB — GLUCOSE, CAPILLARY: Glucose-Capillary: 122 mg/dL — ABNORMAL HIGH (ref 70–99)

## 2013-07-07 LAB — TYPE AND SCREEN
ABO/RH(D): A POS
Antibody Screen: NEGATIVE

## 2013-07-07 SURGERY — ARTHROPLASTY, KNEE, TOTAL
Anesthesia: General | Site: Knee | Laterality: Right | Wound class: Clean

## 2013-07-07 MED ORDER — MIDAZOLAM HCL 5 MG/5ML IJ SOLN
INTRAMUSCULAR | Status: DC | PRN
  Administered 2013-07-07: 2 mg via INTRAVENOUS

## 2013-07-07 MED ORDER — BUPIVACAINE LIPOSOME 1.3 % IJ SUSP
INTRAMUSCULAR | Status: DC | PRN
  Administered 2013-07-07: 20 mL

## 2013-07-07 MED ORDER — MORPHINE SULFATE 2 MG/ML IJ SOLN: 1.0000 mg | INTRAMUSCULAR | Status: DC | PRN

## 2013-07-07 MED ORDER — HYDROMORPHONE HCL PF 1 MG/ML IJ SOLN
INTRAMUSCULAR | Status: DC | PRN
  Administered 2013-07-07 (×2): 1 mg via INTRAVENOUS

## 2013-07-07 MED ORDER — SUCCINYLCHOLINE CHLORIDE 20 MG/ML IJ SOLN
INTRAMUSCULAR | Status: DC | PRN
  Administered 2013-07-07: 100 mg via INTRAVENOUS

## 2013-07-07 MED ORDER — DOCUSATE SODIUM 100 MG PO CAPS
100.0000 mg | ORAL_CAPSULE | Freq: Two times a day (BID) | ORAL | Status: DC
  Administered 2013-07-07 – 2013-07-09 (×4): 100 mg via ORAL

## 2013-07-07 MED ORDER — METOCLOPRAMIDE HCL 5 MG/ML IJ SOLN: 5.0000 mg | Freq: Three times a day (TID) | INTRAMUSCULAR | Status: DC | PRN

## 2013-07-07 MED ORDER — GLYCOPYRROLATE 0.2 MG/ML IJ SOLN
INTRAMUSCULAR | Status: DC | PRN
  Administered 2013-07-07: 0.2 mg via INTRAVENOUS

## 2013-07-07 MED ORDER — TRAMADOL HCL 50 MG PO TABS
50.0000 mg | ORAL_TABLET | Freq: Four times a day (QID) | ORAL | Status: DC | PRN
  Administered 2013-07-08 – 2013-07-09 (×2): 100 mg via ORAL
  Filled 2013-07-07 (×3): qty 2

## 2013-07-07 MED ORDER — LORATADINE 10 MG PO TABS
10.0000 mg | ORAL_TABLET | Freq: Every day | ORAL | Status: DC
  Administered 2013-07-08 – 2013-07-09 (×2): 10 mg via ORAL
  Filled 2013-07-07 (×2): qty 1

## 2013-07-07 MED ORDER — PHENOL 1.4 % MT LIQD: 1.0000 | OROMUCOSAL | Status: DC | PRN

## 2013-07-07 MED ORDER — LIDOCAINE HCL (CARDIAC) 20 MG/ML IV SOLN
INTRAVENOUS | Status: DC | PRN
  Administered 2013-07-07: 100 mg via INTRAVENOUS

## 2013-07-07 MED ORDER — ONDANSETRON HCL 4 MG/2ML IJ SOLN: 4.0000 mg | Freq: Four times a day (QID) | INTRAMUSCULAR | Status: DC | PRN

## 2013-07-07 MED ORDER — FLEET ENEMA 7-19 GM/118ML RE ENEM: 1.0000 | ENEMA | Freq: Once | RECTAL | Status: AC | PRN

## 2013-07-07 MED ORDER — 0.9 % SODIUM CHLORIDE (POUR BTL) OPTIME
TOPICAL | Status: DC | PRN
  Administered 2013-07-07: 1000 mL

## 2013-07-07 MED ORDER — HYDROMORPHONE HCL PF 1 MG/ML IJ SOLN: 0.2500 mg | INTRAMUSCULAR | Status: DC | PRN

## 2013-07-07 MED ORDER — DIPHENHYDRAMINE HCL 12.5 MG/5ML PO ELIX: 12.5000 mg | ORAL_SOLUTION | ORAL | Status: DC | PRN

## 2013-07-07 MED ORDER — METFORMIN HCL ER 750 MG PO TB24
750.0000 mg | ORAL_TABLET | Freq: Every day | ORAL | Status: DC
  Administered 2013-07-08 – 2013-07-09 (×2): 750 mg via ORAL
  Filled 2013-07-07 (×2): qty 1

## 2013-07-07 MED ORDER — SODIUM CHLORIDE 0.9 % IR SOLN
Status: DC | PRN
  Administered 2013-07-07: 1000 mL

## 2013-07-07 MED ORDER — SODIUM CHLORIDE 0.9 % IV SOLN: INTRAVENOUS | Status: DC

## 2013-07-07 MED ORDER — DEXAMETHASONE SODIUM PHOSPHATE 10 MG/ML IJ SOLN
10.0000 mg | Freq: Once | INTRAMUSCULAR | Status: AC
  Administered 2013-07-07: 10 mg via INTRAVENOUS

## 2013-07-07 MED ORDER — METOPROLOL SUCCINATE ER 100 MG PO TB24
100.0000 mg | ORAL_TABLET | Freq: Every day | ORAL | Status: DC
  Administered 2013-07-07 – 2013-07-08 (×2): 100 mg via ORAL
  Filled 2013-07-07 (×3): qty 1

## 2013-07-07 MED ORDER — SODIUM CHLORIDE 0.9 % IV SOLN
INTRAVENOUS | Status: DC
  Administered 2013-07-07 – 2013-07-08 (×2): via INTRAVENOUS

## 2013-07-07 MED ORDER — RIVAROXABAN 10 MG PO TABS
10.0000 mg | ORAL_TABLET | Freq: Every day | ORAL | Status: DC
  Administered 2013-07-08 – 2013-07-09 (×2): 10 mg via ORAL
  Filled 2013-07-07 (×4): qty 1

## 2013-07-07 MED ORDER — KETOROLAC TROMETHAMINE 15 MG/ML IJ SOLN
7.5000 mg | Freq: Four times a day (QID) | INTRAMUSCULAR | Status: AC | PRN
  Administered 2013-07-07: 7.5 mg via INTRAVENOUS
  Filled 2013-07-07: qty 1

## 2013-07-07 MED ORDER — METHOCARBAMOL 500 MG PO TABS
500.0000 mg | ORAL_TABLET | Freq: Four times a day (QID) | ORAL | Status: DC | PRN
  Administered 2013-07-08 – 2013-07-09 (×4): 500 mg via ORAL
  Filled 2013-07-07 (×4): qty 1

## 2013-07-07 MED ORDER — HYDROCHLOROTHIAZIDE 25 MG PO TABS
25.0000 mg | ORAL_TABLET | Freq: Every morning | ORAL | Status: DC
  Administered 2013-07-09: 25 mg via ORAL
  Filled 2013-07-07 (×2): qty 1

## 2013-07-07 MED ORDER — BUPIVACAINE LIPOSOME 1.3 % IJ SUSP
20.0000 mL | Freq: Once | INTRAMUSCULAR | Status: DC
  Filled 2013-07-07: qty 20

## 2013-07-07 MED ORDER — HYDROMORPHONE HCL 2 MG PO TABS
2.0000 mg | ORAL_TABLET | ORAL | Status: DC | PRN
  Administered 2013-07-07: 4 mg via ORAL
  Administered 2013-07-08 (×2): 2 mg via ORAL
  Administered 2013-07-08: 4 mg via ORAL
  Administered 2013-07-09 (×2): 2 mg via ORAL
  Filled 2013-07-07 (×2): qty 1
  Filled 2013-07-07 (×2): qty 2
  Filled 2013-07-07 (×3): qty 1

## 2013-07-07 MED ORDER — BISACODYL 10 MG RE SUPP: 10.0000 mg | Freq: Every day | RECTAL | Status: DC | PRN

## 2013-07-07 MED ORDER — CLONAZEPAM 1 MG PO TABS
2.0000 mg | ORAL_TABLET | Freq: Every evening | ORAL | Status: DC | PRN
  Administered 2013-07-07: 2 mg via ORAL
  Filled 2013-07-07: qty 2

## 2013-07-07 MED ORDER — GABAPENTIN 300 MG PO CAPS
600.0000 mg | ORAL_CAPSULE | Freq: Every day | ORAL | Status: DC
  Administered 2013-07-07 – 2013-07-08 (×2): 600 mg via ORAL
  Filled 2013-07-07 (×3): qty 2

## 2013-07-07 MED ORDER — CEFAZOLIN SODIUM-DEXTROSE 2-3 GM-% IV SOLR
2.0000 g | INTRAVENOUS | Status: AC
  Administered 2013-07-07: 2 g via INTRAVENOUS

## 2013-07-07 MED ORDER — ACETAMINOPHEN 500 MG PO TABS
1000.0000 mg | ORAL_TABLET | Freq: Four times a day (QID) | ORAL | Status: AC
  Administered 2013-07-07 – 2013-07-08 (×4): 1000 mg via ORAL
  Filled 2013-07-07 (×4): qty 2

## 2013-07-07 MED ORDER — ATORVASTATIN CALCIUM 80 MG PO TABS
80.0000 mg | ORAL_TABLET | Freq: Every day | ORAL | Status: DC
  Administered 2013-07-08 – 2013-07-09 (×2): 80 mg via ORAL
  Filled 2013-07-07 (×2): qty 1

## 2013-07-07 MED ORDER — VENLAFAXINE HCL ER 75 MG PO CP24
75.0000 mg | ORAL_CAPSULE | Freq: Every day | ORAL | Status: DC
  Administered 2013-07-08 – 2013-07-09 (×2): 75 mg via ORAL
  Filled 2013-07-07 (×2): qty 1

## 2013-07-07 MED ORDER — CEFAZOLIN SODIUM 1-5 GM-% IV SOLN
1.0000 g | Freq: Four times a day (QID) | INTRAVENOUS | Status: AC
  Administered 2013-07-07 – 2013-07-08 (×2): 1 g via INTRAVENOUS
  Filled 2013-07-07 (×2): qty 50

## 2013-07-07 MED ORDER — MENTHOL 3 MG MT LOZG: 1.0000 | LOZENGE | OROMUCOSAL | Status: DC | PRN

## 2013-07-07 MED ORDER — INSULIN ASPART 100 UNIT/ML ~~LOC~~ SOLN
0.0000 [IU] | Freq: Three times a day (TID) | SUBCUTANEOUS | Status: DC
  Administered 2013-07-07 – 2013-07-08 (×2): 3 [IU] via SUBCUTANEOUS
  Administered 2013-07-08: 2 [IU] via SUBCUTANEOUS
  Administered 2013-07-08: 5 [IU] via SUBCUTANEOUS
  Administered 2013-07-09 (×2): 3 [IU] via SUBCUTANEOUS

## 2013-07-07 MED ORDER — NALOXONE HCL 0.4 MG/ML IJ SOLN
INTRAMUSCULAR | Status: DC | PRN
  Administered 2013-07-07: .08 mg via INTRAVENOUS

## 2013-07-07 MED ORDER — ONDANSETRON HCL 4 MG PO TABS: 4.0000 mg | ORAL_TABLET | Freq: Four times a day (QID) | ORAL | Status: DC | PRN

## 2013-07-07 MED ORDER — FENTANYL CITRATE 0.05 MG/ML IJ SOLN
INTRAMUSCULAR | Status: DC | PRN
  Administered 2013-07-07: 100 ug via INTRAVENOUS

## 2013-07-07 MED ORDER — BUPIVACAINE HCL 0.25 % IJ SOLN
INTRAMUSCULAR | Status: DC | PRN
  Administered 2013-07-07: 20 mL

## 2013-07-07 MED ORDER — LACTATED RINGERS IV SOLN: INTRAVENOUS | Status: DC

## 2013-07-07 MED ORDER — METHOCARBAMOL 100 MG/ML IJ SOLN
500.0000 mg | Freq: Four times a day (QID) | INTRAVENOUS | Status: DC | PRN
  Administered 2013-07-07: 500 mg via INTRAVENOUS
  Filled 2013-07-07 (×2): qty 5

## 2013-07-07 MED ORDER — POLYETHYLENE GLYCOL 3350 17 G PO PACK: 17.0000 g | PACK | Freq: Every day | ORAL | Status: DC | PRN

## 2013-07-07 MED ORDER — CHLORHEXIDINE GLUCONATE 4 % EX LIQD
60.0000 mL | Freq: Once | CUTANEOUS | Status: DC
  Filled 2013-07-07: qty 60

## 2013-07-07 MED ORDER — METOCLOPRAMIDE HCL 10 MG PO TABS: 5.0000 mg | ORAL_TABLET | Freq: Three times a day (TID) | ORAL | Status: DC | PRN

## 2013-07-07 MED ORDER — ONDANSETRON HCL 4 MG/2ML IJ SOLN
INTRAMUSCULAR | Status: DC | PRN
  Administered 2013-07-07: 4 mg via INTRAVENOUS

## 2013-07-07 MED ORDER — DEXAMETHASONE SODIUM PHOSPHATE 10 MG/ML IJ SOLN
10.0000 mg | Freq: Every day | INTRAMUSCULAR | Status: AC
  Filled 2013-07-07: qty 1

## 2013-07-07 MED ORDER — PROPOFOL 10 MG/ML IV BOLUS
INTRAVENOUS | Status: DC | PRN
  Administered 2013-07-07: 200 mg via INTRAVENOUS

## 2013-07-07 MED ORDER — SODIUM CHLORIDE 0.9 % IJ SOLN
INTRAMUSCULAR | Status: DC | PRN
  Administered 2013-07-07: 20 mL

## 2013-07-07 MED ORDER — DEXAMETHASONE 6 MG PO TABS
10.0000 mg | ORAL_TABLET | Freq: Every day | ORAL | Status: AC
  Administered 2013-07-08: 10 mg via ORAL
  Filled 2013-07-07: qty 1

## 2013-07-07 MED ORDER — ACETAMINOPHEN 500 MG PO TABS
1000.0000 mg | ORAL_TABLET | Freq: Once | ORAL | Status: AC
  Administered 2013-07-07: 1000 mg via ORAL
  Filled 2013-07-07: qty 2

## 2013-07-07 MED ORDER — PANTOPRAZOLE SODIUM 40 MG PO TBEC
40.0000 mg | DELAYED_RELEASE_TABLET | Freq: Every evening | ORAL | Status: DC
  Administered 2013-07-07 – 2013-07-08 (×2): 40 mg via ORAL
  Filled 2013-07-07 (×3): qty 1

## 2013-07-07 MED ORDER — NITROGLYCERIN 0.4 MG SL SUBL: 0.4000 mg | SUBLINGUAL_TABLET | SUBLINGUAL | Status: DC | PRN

## 2013-07-07 MED ORDER — LACTATED RINGERS IV SOLN
INTRAVENOUS | Status: DC
  Administered 2013-07-07: 1000 mL via INTRAVENOUS
  Administered 2013-07-07: 14:00:00 via INTRAVENOUS

## 2013-07-07 SURGICAL SUPPLY — 57 items
BAG ZIPLOCK 12X15 (MISCELLANEOUS) ×2 IMPLANT
BANDAGE ELASTIC 6 VELCRO ST LF (GAUZE/BANDAGES/DRESSINGS) ×2 IMPLANT
BANDAGE ESMARK 6X9 LF (GAUZE/BANDAGES/DRESSINGS) ×1 IMPLANT
BLADE SAG 18X100X1.27 (BLADE) ×2 IMPLANT
BLADE SAW SGTL 11.0X1.19X90.0M (BLADE) ×2 IMPLANT
BNDG ESMARK 6X9 LF (GAUZE/BANDAGES/DRESSINGS) ×2
BOWL SMART MIX CTS (DISPOSABLE) ×2 IMPLANT
CAPT RP KNEE ×2 IMPLANT
CEMENT HV SMART SET (Cement) ×4 IMPLANT
CLOTH BEACON ORANGE TIMEOUT ST (SAFETY) ×2 IMPLANT
CUFF TOURN SGL QUICK 34 (TOURNIQUET CUFF) ×1
CUFF TRNQT CYL 34X4X40X1 (TOURNIQUET CUFF) ×1 IMPLANT
DECANTER SPIKE VIAL GLASS SM (MISCELLANEOUS) ×2 IMPLANT
DRAPE EXTREMITY T 121X128X90 (DRAPE) ×2 IMPLANT
DRAPE POUCH INSTRU U-SHP 10X18 (DRAPES) ×2 IMPLANT
DRAPE U-SHAPE 47X51 STRL (DRAPES) ×2 IMPLANT
DRSG ADAPTIC 3X8 NADH LF (GAUZE/BANDAGES/DRESSINGS) ×2 IMPLANT
DRSG PAD ABDOMINAL 8X10 ST (GAUZE/BANDAGES/DRESSINGS) ×2 IMPLANT
DURAPREP 26ML APPLICATOR (WOUND CARE) ×2 IMPLANT
ELECT REM PT RETURN 9FT ADLT (ELECTROSURGICAL) ×2
ELECTRODE REM PT RTRN 9FT ADLT (ELECTROSURGICAL) ×1 IMPLANT
EVACUATOR 1/8 PVC DRAIN (DRAIN) ×2 IMPLANT
FACESHIELD LNG OPTICON STERILE (SAFETY) ×10 IMPLANT
GLOVE BIO SURGEON STRL SZ7.5 (GLOVE) IMPLANT
GLOVE BIO SURGEON STRL SZ8 (GLOVE) ×2 IMPLANT
GLOVE BIOGEL PI IND STRL 8 (GLOVE) ×2 IMPLANT
GLOVE BIOGEL PI INDICATOR 8 (GLOVE) ×2
GLOVE SURG SS PI 6.5 STRL IVOR (GLOVE) IMPLANT
GOWN STRL NON-REIN LRG LVL3 (GOWN DISPOSABLE) ×2 IMPLANT
GOWN STRL REIN XL XLG (GOWN DISPOSABLE) IMPLANT
HANDPIECE INTERPULSE COAX TIP (DISPOSABLE) ×1
IMMOBILIZER KNEE 20 (SOFTGOODS) ×2
IMMOBILIZER KNEE 20 THIGH 36 (SOFTGOODS) ×1 IMPLANT
KIT BASIN OR (CUSTOM PROCEDURE TRAY) ×2 IMPLANT
MANIFOLD NEPTUNE II (INSTRUMENTS) ×2 IMPLANT
NDL SAFETY ECLIPSE 18X1.5 (NEEDLE) ×2 IMPLANT
NEEDLE HYPO 18GX1.5 SHARP (NEEDLE) ×2
NS IRRIG 1000ML POUR BTL (IV SOLUTION) ×2 IMPLANT
PACK TOTAL JOINT (CUSTOM PROCEDURE TRAY) ×2 IMPLANT
PAD ABD 7.5X8 STRL (GAUZE/BANDAGES/DRESSINGS) ×2 IMPLANT
PADDING CAST COTTON 6X4 STRL (CAST SUPPLIES) ×2 IMPLANT
POSITIONER SURGICAL ARM (MISCELLANEOUS) ×2 IMPLANT
SET HNDPC FAN SPRY TIP SCT (DISPOSABLE) ×1 IMPLANT
SPONGE GAUZE 4X4 12PLY (GAUZE/BANDAGES/DRESSINGS) ×2 IMPLANT
STRIP CLOSURE SKIN 1/2X4 (GAUZE/BANDAGES/DRESSINGS) ×4 IMPLANT
SUCTION FRAZIER 12FR DISP (SUCTIONS) ×2 IMPLANT
SUT MNCRL AB 4-0 PS2 18 (SUTURE) ×2 IMPLANT
SUT VIC AB 2-0 CT1 27 (SUTURE) ×3
SUT VIC AB 2-0 CT1 TAPERPNT 27 (SUTURE) ×3 IMPLANT
SUT VLOC 180 0 24IN GS25 (SUTURE) ×2 IMPLANT
SYR 20CC LL (SYRINGE) ×2 IMPLANT
SYR 50ML LL SCALE MARK (SYRINGE) ×2 IMPLANT
TAPE STRIPS DRAPE STRL (GAUZE/BANDAGES/DRESSINGS) ×2 IMPLANT
TOWEL OR 17X26 10 PK STRL BLUE (TOWEL DISPOSABLE) ×4 IMPLANT
TRAY FOLEY CATH 14FRSI W/METER (CATHETERS) ×2 IMPLANT
WATER STERILE IRR 1500ML POUR (IV SOLUTION) ×2 IMPLANT
WRAP KNEE MAXI GEL POST OP (GAUZE/BANDAGES/DRESSINGS) ×2 IMPLANT

## 2013-07-07 NOTE — Anesthesia Procedure Notes (Addendum)
Spinal  Patient location during procedure: OR Start time: 07/07/2013 12:45 PM End time: 07/07/2013 12:57 PM Staffing Anesthesiologist: Ronelle Nigh L Performed by: anesthesiologist  Preanesthetic Checklist Completed: patient identified, site marked, surgical consent, pre-op evaluation, timeout performed, IV checked, risks and benefits discussed and monitors and equipment checked Spinal Block Patient position: sitting Prep: Betadine Patient monitoring: heart rate, continuous pulse ox and blood pressure Approach: midline Location: L3-4 Needle Needle type: Quincke  Needle gauge: 22 G Needle length: 9 cm Additional Notes Expiration date of kit checked and confirmed. Attempted spinal numerous times both midline and paramedian without success.  Got heme on one attempt.  No injection made.

## 2013-07-07 NOTE — H&P (View-Only) (Signed)
Morgan [name]  DOB: 04/05/1941 Married / Language: English / Race: White Female  Date of Admission:  07/07/2013  Chief Complaint:  Right Knee Pain  History of Present Illness The patient is a 72 year old female who comes in today for a preoperative History and Physical. The patient is scheduled for a right total knee arthroplasty to be performed by Dr. Frank V. Aluisio, MD at [facility] on 07/07/2013. The patient is a 72 year old female who presents for follow up of their knee. The patient is being followed for their right knee pain. They are now 7 month(s) out from surgery (knee arthroscopy). Symptoms reported today include: pain, swelling and instability. The patient feels that they are doing poorly and report their pain level to be moderate. The patient presents today following MRI. The patient has not gotten any relief of their symptoms with viscosupplementation. Unfortunately she continues with worsening pain in the right knee. Last time we saw her we decided to get an MRI as she was not completely bone on bone and was still having considerable symptoms even after her visco supplements. Unfortunately she has not gotten better with any of the previous interventions. An arthroscopy now is going to be highly unpredictable. I told her it would be about a 50% chance of her getting better but also a 50% chance of her getting worse. She is not interested in that. I told her the most predictable means of getting her better long term would be a total knee arthroplasty. Even if she had a successful arthroscopy eventually she would need the total knee. She just wants to go ahead and get the total knee done. We did discuss procedure, risks, potential complications, rehab course in detail today and she would like to proceed. She is now ready to proceed. They have been treated conservatively in the past for the above stated problem and despite conservative measures, they continue to have  progressive pain and severe functional limitations and dysfunction. They have failed non-operative management including home exercise, medications, and injections. It is felt that they would benefit from undergoing total joint replacement. Risks and benefits of the procedure have been discussed with the patient and they elect to proceed with surgery. There are no active contraindications to surgery such as ongoing infection or rapidly progressive neurological disease.  Problem List Primary osteoarthritis of one knee (715.16)   Allergies Percocet *ANALGESICS - OPIOID* Sulfa Drugs PriLOSEC *ULCER DRUGS*   Family History Diabetes Mellitus. sister Cancer. mother and sister Heart Disease. father and brother Hypertension. sister, brother and child Heart disease in female family member before age 55   Social History Tobacco use. never smoker Exercise. Exercises rarely; does other Drug/Alcohol Rehab (Previously). no Illicit drug use. no Marital status. married Living situation. live with spouse Alcohol use. current drinker; drinks wine; only occasionally per week Children. 3 Drug/Alcohol Rehab (Currently). no Current work status. retired Number of flights of stairs before winded. 1 Tobacco / smoke exposure. no Pain Contract. no   Medication History MetFORMIN HCl ( Oral) Specific dose unknown - Active. ClonazePAM (1MG Tablet, Oral) Active. Effexor XR (37.5MG Capsule ER 24HR, Oral) Active. Calcium D-Glucarate (500MG Capsule, Oral) Active. Multiple Vitamin ( Oral) Active. PreserVision AREDS ( Oral) Active. Vitamin D (1000UNIT Tablet, Oral) Active. Omega 3 ( Oral) Specific dose unknown - Active. Nitroglycerin (0.4MG Tab Sublingual, Sublingual) Active. Aspirin EC Low Strength (81MG Tablet DR, Oral) Active. Hydrochlorothiazide (25MG Tablet, Oral) Active. Toprol XL (100MG Tablet ER 24HR, Oral)   Active. Pantoprazole Sodium (40MG Tablet DR, Oral) Active. Epi E-Z Pen  (1:1000 Device, Injection) Active. ZyrTEC Allergy (10MG Tablet, Oral) Active.  Past Surgical History Straighten Nasal Septum Heart Stents. Date: 2012. Valve Replacement. replaced: unknown valve, had three valves replaced during procedure Colon Polyp Removal - Colonoscopy Coronary Artery Bypass Graft. 3 vessels Colon Polyp Removal - Open   Medical History Gastroesophageal Reflux Disease Fibromyalgia High blood pressure Sleep Apnea. uses CPAP Rheumatoid Arthritis Depression Coronary artery disease Chronic Pain   Review of Systems General:Not Present- Chills, Fever, Night Sweats, Fatigue, Weight Gain, Weight Loss and Memory Loss. Skin:Not Present- Hives, Itching, Rash, Eczema and Lesions. HEENT:Not Present- Tinnitus, Headache, Double Vision, Visual Loss, Hearing Loss and Dentures. Respiratory:Not Present- Shortness of breath with exertion, Shortness of breath at rest, Allergies, Coughing up blood and Chronic Cough. Cardiovascular:Not Present- Chest Pain, Racing/skipping heartbeats, Difficulty Breathing Lying Down, Murmur, Swelling and Palpitations. Gastrointestinal:Not Present- Bloody Stool, Heartburn, Abdominal Pain, Vomiting, Nausea, Constipation, Diarrhea, Difficulty Swallowing, Jaundice and Loss of appetitie. Female Genitourinary:Not Present- Blood in Urine, Urinary frequency, Weak urinary stream, Discharge, Flank Pain, Incontinence, Painful Urination, Urgency, Urinary Retention and Urinating at Night. Musculoskeletal:Not Present- Muscle Weakness, Muscle Pain, Joint Swelling, Joint Pain, Back Pain, Morning Stiffness and Spasms. Neurological:Not Present- Tremor, Dizziness, Blackout spells, Paralysis, Difficulty with balance and Weakness. Psychiatric:Not Present- Insomnia.   Vitals Pulse: 64 (Regular) Resp.: 14 (Unlabored) BP: 138/70 (Sitting, Left Arm, Standard)    Physical Exam The physical exam findings are as follows:  Note: Patient is a 72  year old female with continued knee pain.   General Mental Status - Alert, cooperative and good historian. General Appearance- pleasant. Not in acute distress. Orientation- Oriented X3. Build & Nutrition- Well nourished and Well developed.   Head and Neck Head- normocephalic, atraumatic . Neck Global Assessment- supple. no bruit auscultated on the right and no bruit auscultated on the left.   Eye Vision- Wears corrective lenses. Pupil- Bilateral- Regular and Round. Motion- Bilateral- EOMI.   ENMT upper denture plate  Chest and Lung Exam Auscultation: Breath sounds:- clear at anterior chest wall and - clear at posterior chest wall. Adventitious sounds:- No Adventitious sounds.   Cardiovascular Auscultation:Rhythm- Regular rate and rhythm. Heart Sounds- S1 WNL and S2 WNL. Murmurs & Other Heart Sounds:Auscultation of the heart reveals - No Murmurs.   Abdomen Palpation/Percussion:Tenderness- Abdomen is non-tender to palpation. Rigidity (guarding)- Abdomen is soft. Auscultation:Auscultation of the abdomen reveals - Bowel sounds normal.   Female Genitourinary Not done, not pertinent to present illness  Musculoskeletal On exam she is alert and oriented in no apparent distress. Her right knee shows a moderate sized effusion. Range about 5 to 130. There is moderate crepitus on range of motion. There is tenderness lateral greater than medial with no instability noted.  RADIOGRAPHS: AP both knees and lateral and she has medial joint space narrowing but not bone on bone.  Assessment & Plan Primary osteoarthritis of one knee (715.16) Impression: Right Knee  Note: Plan is for a Right Total Knee Replacement by Dr. Aluisio.  Plan is to go home.  PCP - Dr. Bedloe, [place],   The patient will not receive TXA (tranexamic acid) due to: Coronary Arterial Disease  Time spent ~ 20 minutes  Signed electronically by Arienne Gartin L Florina Glas, III  PA-C 

## 2013-07-07 NOTE — Op Note (Signed)
Pre-operative diagnosis- Osteoarthritis  Right knee(s)  Post-operative diagnosis- Osteoarthritis Right knee(s)  Procedure-  Right  Total Knee Arthroplasty  Surgeon- [name]. Cing , MD  Assistant- Avel Peace, PA-C   Anesthesia-  General EBL-* No blood loss amount entered *  Drains Hemovac  Tourniquet time- * Missing tourniquet times found for documented tourniquets in log:  [id number] *   Complications- None  Condition-PACU - hemodynamically stable.   Brief Clinical Note  Morgan [name] is a 72 y.o. year old female with end stage OA of her right knee with progressively worsening pain and dysfunction. She has constant pain, with activity and at rest and significant functional deficits with difficulties even with ADLs. She has had extensive non-op management including analgesics, injections of cortisone and viscosupplements, and home exercise program, but remains in significant pain with significant dysfunction.Radiographs show bone on bone arthritis lateral. She presents now for right Total Knee Arthroplasty.    Procedure in detail---   The patient is brought into the operating room and positioned supine on the operating table. After successful administration of  General,   a tourniquet is placed high on the  Right thigh(s) and the lower extremity is prepped and draped in the usual sterile fashion. Time out is performed by the operating team and then the  Right lower extremity is wrapped in Esmarch, knee flexed and the tourniquet inflated to 300 mmHg.       A midline incision is made with a ten blade through the subcutaneous tissue to the level of the extensor mechanism. A fresh blade is used to make a medial parapatellar arthrotomy. Soft tissue over the proximal medial tibia is subperiosteally elevated to the joint line with a knife and into the semimembranosus bursa with a Cobb elevator. Soft tissue over the proximal lateral tibia is elevated with attention being paid to avoiding the  patellar tendon on the tibial tubercle. The patella is everted, knee flexed 90 degrees and the ACL and PCL are removed. Findings are bone on bone lateral and patellofemoral with exposed bone medial femur also.        The drill is used to create a starting hole in the distal femur and the canal is thoroughly irrigated with sterile saline to remove the fatty contents. The 5 degree Right  valgus alignment guide is placed into the femoral canal and the distal femoral cutting block is pinned to remove 10 mm off the distal femur. Resection is made with an oscillating saw.      The tibia is subluxed forward and the menisci are removed. The extramedullary alignment guide is placed referencing proximally at the medial aspect of the tibial tubercle and distally along the second metatarsal axis and tibial crest. The block is pinned to remove 2mm off the more deficient lateral  side. Resection is made with an oscillating saw. Size 3is the most appropriate size for the tibia and the proximal tibia is prepared with the modular drill and keel punch for that size.      The femoral sizing guide is placed and size 3 is most appropriate. Rotation is marked off the epicondylar axis and confirmed by creating a rectangular flexion gap at 90 degrees. The size 3 cutting block is pinned in this rotation and the anterior, posterior and chamfer cuts are made with the oscillating saw. The intercondylar block is then placed and that cut is made.      Trial size 3 tibial component, trial size 3 posterior stabilized femur and a 12.5  mm posterior stabilized rotating platform insert trial is placed. Full extension is achieved with excellent varus/valgus and anterior/posterior balance throughout full range of motion. The patella is everted and thickness measured to be 22  mm. Free hand resection is taken to 12 mm, a 35 template is placed, lug holes are drilled, trial patella is placed, and it tracks normally. Osteophytes are removed off the  posterior femur with the trial in place. All trials are removed and the cut bone surfaces prepared with pulsatile lavage. Cement is mixed and once ready for implantation, the size 3 tibial implant, size  3 posterior stabilized femoral component, and the size 35 patella are cemented in place and the patella is held with the clamp. The trial insert is placed and the knee held in full extension. The Exparel (20 ml mixed with 30 ml saline) and .25% Bupivicaine, are injected into the extensor mechanism, posterior capsule, medial and lateral gutters and subcutaneous tissues.  All extruded cement is removed and once the cement is hard the permanent 12.5 mm posterior stabilized rotating platform insert is placed into the tibial tray.      The wound is copiously irrigated with saline solution and the extensor mechanism closed over a hemovac drain with #1 PDS suture. The tourniquet is released for a total tourniquet time of 32  minutes. Flexion against gravity is 140 degrees and the patella tracks normally. Subcutaneous tissue is closed with 2.0 vicryl and subcuticular with running 4.0 Monocryl. The incision is cleaned and dried and steri-strips and a bulky sterile dressing are applied. The limb is placed into a knee immobilizer and the patient is awakened and transported to recovery in stable condition.      Please note that a surgical assistant was a medical necessity for this procedure in order to perform it in a safe and expeditious manner. Surgical assistant was necessary to retract the ligaments and vital neurovascular structures to prevent injury to them and also necessary for proper positioning of the limb to allow for anatomic placement of the prosthesis.   [name] Sherolyn Trettin, MD    07/07/2013, 2:07 PM

## 2013-07-07 NOTE — Interval H&P Note (Signed)
History and Physical Interval Note:  07/07/2013 11:58 AM  Morgan [name]  has presented today for surgery, with the diagnosis of OA RIGHT KNEE  The various methods of treatment have been discussed with the patient and family. After consideration of risks, benefits and other options for treatment, the patient has consented to  Procedure(s): RIGHT TOTAL KNEE ARTHROPLASTY (Right) as a surgical intervention .  The patient's history has been reviewed, patient examined, no change in status, stable for surgery.  I have reviewed the patient's chart and labs.  Questions were answered to the patient's satisfaction.     Loanne Drilling

## 2013-07-07 NOTE — Anesthesia Postprocedure Evaluation (Signed)
  Anesthesia Post-op Note  Patient: Therapist, nutritional  Procedure(s) Performed: Procedure(s) (LRB): RIGHT TOTAL KNEE ARTHROPLASTY (Right)  Patient Location: PACU  Anesthesia Type: General  Level of Consciousness: awake and alert   Airway and Oxygen Therapy: Patient Spontanous Breathing  Post-op Pain: mild  Post-op Assessment: Post-op Vital signs reviewed, Patient's Cardiovascular Status Stable, Respiratory Function Stable, Patent Airway and No signs of Nausea or vomiting  Last Vitals:  Filed Vitals:   07/07/13 1500  BP: 138/67  Pulse: 66  Temp:   Resp: 16    Post-op Vital Signs: stable   Complications: No apparent anesthesia complications

## 2013-07-07 NOTE — Transfer of Care (Signed)
Immediate Anesthesia Transfer of Care Note  Patient: Morgan [name]  Procedure(s) Performed: Procedure(s): RIGHT TOTAL KNEE ARTHROPLASTY (Right)  Patient Location: PACU  Anesthesia Type:General  Level of Consciousness: sedated  Airway & Oxygen Therapy: Patient Spontanous Breathing and Patient connected to face mask oxygen  Post-op Assessment: Report given to PACU RN and Post -op Vital signs reviewed and stable  Post vital signs: Reviewed and stable  Complications: No apparent anesthesia complications

## 2013-07-07 NOTE — Anesthesia Preprocedure Evaluation (Addendum)
Anesthesia Evaluation  Patient identified by MRN, date of birth, ID band Patient awake    Reviewed: Allergy & Precautions, H&P , NPO status , Patient's Chart, lab work & pertinent test results, reviewed documented beta blocker date and time   History of Anesthesia Complications (+) PONV  Airway Mallampati: II TM Distance: >3 FB Neck ROM: full    Dental  (+) Caps and Dental Advisory Given Right upper front and upper lateral front are all capped:   Pulmonary sleep apnea and Continuous Positive Airway Pressure Ventilation ,  breath sounds clear to auscultation  Pulmonary exam normal       Cardiovascular hypertension, Pt. on home beta blockers and Pt. on medications + CAD, + Cardiac Stents and + CABG + dysrhythmias Atrial Fibrillation Rhythm:regular Rate:Normal  No CP   Neuro/Psych sciatica negative neurological ROS  negative psych ROS   GI/Hepatic negative GI ROS, Neg liver ROS, hiatal hernia, GERD-  Medicated and Controlled,  Endo/Other  diabetes, Well Controlled, Type 2, Oral Hypoglycemic Agents  Renal/GU negative Renal ROS  negative genitourinary   Musculoskeletal   Abdominal   Peds  Hematology negative hematology ROS (+)   Anesthesia Other Findings   Reproductive/Obstetrics negative OB ROS                          Anesthesia Physical Anesthesia Plan  ASA: III  Anesthesia Plan: Spinal   Post-op Pain Management:    Induction:   Airway Management Planned: Simple Face Mask  Additional Equipment:   Intra-op Plan:   Post-operative Plan:   Informed Consent: I have reviewed the patients History and Physical, chart, labs and discussed the procedure including the risks, benefits and alternatives for the proposed anesthesia with the patient or authorized representative who has indicated his/her understanding and acceptance.   Dental Advisory Given  Plan Discussed with: CRNA and  Surgeon  Anesthesia Plan Comments:        Anesthesia Quick Evaluation

## 2013-07-07 NOTE — Progress Notes (Signed)
She is allergic to the oxycodone in Percocet. Patient states she is not allergic to acetaminophen and has no issues taking it.

## 2013-07-08 ENCOUNTER — Encounter (HOSPITAL_COMMUNITY): Payer: Self-pay | Admitting: Orthopedic Surgery

## 2013-07-08 LAB — BASIC METABOLIC PANEL
BUN: 25 mg/dL — ABNORMAL HIGH (ref 6–23)
CO2: 28 mEq/L (ref 19–32)
Calcium: 8.5 mg/dL (ref 8.4–10.5)
Chloride: 101 mEq/L (ref 96–112)
Creatinine, Ser: 0.91 mg/dL (ref 0.50–1.10)
GFR calc Af Amer: 72 mL/min — ABNORMAL LOW (ref >90)
GFR calc non Af Amer: 62 mL/min — ABNORMAL LOW (ref >90)
Glucose, Bld: 178 mg/dL — ABNORMAL HIGH (ref 70–99)
Potassium: 3.9 mEq/L (ref 3.5–5.1)
Sodium: 136 mEq/L (ref 135–145)

## 2013-07-08 LAB — CBC
HCT: 30.9 % — ABNORMAL LOW (ref 36.0–46.0)
Hemoglobin: 10.3 g/dL — ABNORMAL LOW (ref 12.0–15.0)
MCH: 29.9 pg (ref 26.0–34.0)
MCHC: 33.3 g/dL (ref 30.0–36.0)
MCV: 89.8 fL (ref 78.0–100.0)
Platelets: 225 10*3/uL (ref 150–400)
RBC: 3.44 MIL/uL — ABNORMAL LOW (ref 3.87–5.11)
RDW: 12.8 % (ref 11.5–15.5)
WBC: 10.9 10*3/uL — ABNORMAL HIGH (ref 4.0–10.5)

## 2013-07-08 LAB — GLUCOSE, CAPILLARY
Glucose-Capillary: 142 mg/dL — ABNORMAL HIGH (ref 70–99)
Glucose-Capillary: 179 mg/dL — ABNORMAL HIGH (ref 70–99)
Glucose-Capillary: 213 mg/dL — ABNORMAL HIGH (ref 70–99)

## 2013-07-08 MED ORDER — SODIUM CHLORIDE 0.9 % IV BOLUS (SEPSIS)
250.0000 mL | Freq: Once | INTRAVENOUS | Status: AC
  Administered 2013-07-08: 250 mL via INTRAVENOUS

## 2013-07-08 NOTE — Progress Notes (Signed)
Physical Therapy Treatment Patient Details Name: Morgan [name] MRN: [id number] DOB: Jul 22, 1941 Today's Date: 07/08/2013 Time: 4098-1191 PT Time Calculation (min): 24 min  PT Assessment / Plan / Recommendation  History of Present Illness 72 y.o. female with h/o fibromyalgia, RA admitted for R TKA.    PT Comments   POD # 1 pm session.  Assisted pt OOB to amb in hallway second time.  Pt required increased time and cueing to decrease mild anxiety.  Assisted back to for CPM.   Follow Up Recommendations  Home health PT     Does the patient have the potential to tolerate intense rehabilitation     Barriers to Discharge        Equipment Recommendations  Rolling walker with 5" wheels    Recommendations for Other Services    Frequency 7X/week   Progress towards PT Goals Progress towards PT goals: Progressing toward goals  Plan      Precautions / Restrictions Precautions Precautions: Knee Precaution Comments: Instructed pt on KI use for amb Required Braces or Orthoses: Knee Immobilizer - Right Restrictions Weight Bearing Restrictions: No Other Position/Activity Restrictions: WBAT    Pertinent Vitals/Pain C/o 4/10 knee pain Pre medicated ICE applied    Mobility  Bed Mobility Bed Mobility: Supine to Sit;Sit to Supine Supine to Sit: 4: Min assist Details for Bed Mobility Assistance: assist to support RLE and increased time Transfers Transfers: Sit to Stand;Stand to Sit Sit to Stand: 4: Min assist;From bed Stand to Sit: 4: Min assist;To bed Details for Transfer Assistance: VCs hand placement, assist to rise and steady Ambulation/Gait Ambulation/Gait Assistance: 4: Min assist Ambulation Distance (Feet): 22 Feet Assistive device: Rolling walker Ambulation/Gait Assistance Details: 25% VC's on proper sequencing and increase time Gait Pattern: Step-to pattern;Decreased step length - right;Decreased step length - left;Antalgic Gait velocity: decreased    PT Goals (current  goals can now be found in the care plan section) Acute Rehab PT Goals Patient Stated Goal: to be able to walk  Visit Information  Last PT Received On: 07/08/13 Assistance Needed: +1 History of Present Illness: 72 y.o. female with h/o fibromyalgia, RA admitted for R TKA.     Subjective Data  Patient Stated Goal: to be able to walk   Cognition  Cognition Arousal/Alertness: Awake/alert Behavior During Therapy: WFL for tasks assessed/performed Overall Cognitive Status: Within Functional Limits for tasks assessed    Balance  Balance Balance Assessed: Yes Dynamic Standing Balance Dynamic Standing - Balance Support: During functional activity;Left upper extremity supported Dynamic Standing - Level of Assistance: 4: Min assist  End of Session PT - End of Session Equipment Utilized During Treatment: Gait belt Activity Tolerance: Patient limited by fatigue;Patient limited by pain Patient left: in bed;with call bell/phone within reach;with family/visitor present CPM Right Knee CPM Right Knee: Off   Felecia Shelling  PTA [facility]  Acute  Rehab Pager      [phone]

## 2013-07-08 NOTE — Progress Notes (Signed)
Utilization review completed.  

## 2013-07-08 NOTE — Evaluation (Signed)
Physical Therapy Evaluation Patient Details Name: Morgan [name] MRN: [id number] DOB: 12-13-1940 Today's Date: 07/08/2013 Time: 1478-2956 PT Time Calculation (min): 21 min  PT Assessment / Plan / Recommendation History of Present Illness  72 y.o. female with h/o fibromyalgia, RA admitted for R TKA.   Clinical Impression  **Min assist for bed to recliner with RW. Pt limited by fatigue, minimal dizziness, pain. Good progress expected. She would benefit from acute PT to maximize safety and independence with mobility. *    PT Assessment  Patient needs continued PT services    Follow Up Recommendations  Home health PT    Does the patient have the potential to tolerate intense rehabilitation      Barriers to Discharge        Equipment Recommendations  Rolling walker with 5" wheels    Recommendations for Other Services     Frequency 7X/week    Precautions / Restrictions Precautions Precautions: Knee Required Braces or Orthoses: Knee Immobilizer - Right Restrictions Weight Bearing Restrictions: No Other Position/Activity Restrictions: WBAT   Pertinent Vitals/Pain *5/10 R knee with activity and at rest Premedicated, ice applied R knee**      Mobility  Bed Mobility Bed Mobility: Supine to Sit Supine to Sit: 4: Min assist Details for Bed Mobility Assistance: assist to support RLE Transfers Transfers: Sit to Stand;Stand to Sit Sit to Stand: 4: Min assist;From bed Stand to Sit: 4: Min assist;To chair/3-in-1 Details for Transfer Assistance: VCs hand placement, assist to rise and steady Ambulation/Gait Ambulation/Gait Assistance: 4: Min assist Ambulation Distance (Feet): 3 Feet Assistive device: Rolling walker Gait Pattern: Step-to pattern;Decreased step length - right;Decreased step length - left;Antalgic    Exercises Total Joint Exercises Ankle Circles/Pumps: AROM;Both;10 reps Quad Sets: AROM;Both;5 reps Heel Slides: AAROM;Right;10 reps Goniometric ROM: knee flexion  40*, ext 0*, SLR 2/5   PT Diagnosis: Difficulty walking;Acute pain  PT Problem List: Decreased strength;Decreased range of motion;Decreased activity tolerance;Decreased mobility;Pain PT Treatment Interventions: Gait training;DME instruction;Stair training;Functional mobility training;Therapeutic activities;Therapeutic exercise;Patient/family education     PT Goals(Current goals can be found in the care plan section) Acute Rehab PT Goals Patient Stated Goal: to be able to walk PT Goal Formulation: With patient Time For Goal Achievement: 07/15/13 Potential to Achieve Goals: Good  Visit Information  Last PT Received On: 07/08/13 Assistance Needed: +1 History of Present Illness: 72 y.o. female with h/o fibromyalgia, RA admitted for R TKA.        Prior Functioning  Home Living Family/patient expects to be discharged to:: Private residence Living Arrangements: Spouse/significant other Available Help at Discharge: Family Type of Home: House Home Access: Stairs to enter Secretary/administrator of Steps: 1 Home Layout: One level Home Equipment: Cane - single point;Shower seat - built in;Grab bars - tub/shower;Crutches Prior Function Level of Independence: Independent Communication Communication: No difficulties    Cognition  Cognition Arousal/Alertness: Awake/alert Behavior During Therapy: WFL for tasks assessed/performed Overall Cognitive Status: Within Functional Limits for tasks assessed    Extremity/Trunk Assessment Upper Extremity Assessment Upper Extremity Assessment: Overall WFL for tasks assessed Lower Extremity Assessment Lower Extremity Assessment: RLE deficits/detail RLE Deficits / Details: R knee flexion AAROM 40*, knee ext 0*, SLR 2/5, ankle WNL, pt reports "a little tingling" Bilateral feet which was pre-existing Cervical / Trunk Assessment Cervical / Trunk Assessment: Normal   Balance Balance Balance Assessed: Yes Static Sitting Balance Static Sitting -  Balance Support: Bilateral upper extremity supported;Feet supported Static Sitting - Level of Assistance: 5: Stand by assistance Static Sitting -  Comment/# of Minutes: 3 minutes. SBA 2* pt reported h/o of passing out multiple times after surgical procedures  End of Session PT - End of Session Equipment Utilized During Treatment: Gait belt Activity Tolerance: Patient limited by fatigue;Patient limited by pain Patient left: in chair;with call bell/phone within reach;with family/visitor present CPM Right Knee CPM Right Knee: Off  GP     Morgan [name] 07/08/2013, 10:08 AM [phone]

## 2013-07-08 NOTE — Progress Notes (Signed)
   Subjective: 1 Day Post-Op Procedure(s) (LRB): RIGHT TOTAL KNEE ARTHROPLASTY (Right) Patient reports pain as mild.   Patient seen in rounds with Dr. Lequita Halt. Patient is well, and has had no acute complaints or problems We will start therapy today. Using CPAP at night Plan is to go Home after hospital stay.  Objective: Vital signs in last 24 hours: Temp:  [97.1 F (36.2 C)-98.2 F (36.8 C)] 97.8 F (36.6 C) (08/26 0616) Pulse Rate:  [50-78] 57 (08/26 0616) Resp:  [11-16] 14 (08/26 0616) BP: (93-143)/(60-77) 107/60 mmHg (08/26 0616) SpO2:  [93 %-100 %] 98 % (08/26 0616) Weight:  [84.823 kg (187 lb)] 84.823 kg (187 lb) (08/25 1610)  Intake/Output from previous day:  Intake/Output Summary (Last 24 hours) at 07/08/13 0806 Last data filed at 07/08/13 0734  Gross per 24 hour  Intake 3362.5 ml  Output    940 ml  Net 2422.5 ml    Intake/Output this shift: Total I/O In: 117.5 [I.V.:117.5] Out: -   Labs:  Recent Labs  07/08/13 0418  HGB 10.3*    Recent Labs  07/08/13 0418  WBC 10.9*  RBC 3.44*  HCT 30.9*  PLT 225    Recent Labs  07/08/13 0418  NA 136  K 3.9  CL 101  CO2 28  BUN 25*  CREATININE 0.91  GLUCOSE 178*  CALCIUM 8.5   No results found for this basename: LABPT, INR,  in the last 72 hours  EXAM General - Patient is Alert, Appropriate and Oriented Extremity - Neurovascular intact Sensation intact distally Dorsiflexion/Plantar flexion intact Dressing - dressing C/D/I Motor Function - intact, moving foot and toes well on exam.  Hemovac pulled without difficulty.  Past Medical History  Diagnosis Date  . Hypertension   . High cholesterol   . Hyperlipidemia   . Angina   . Migraines     "til ~ 1980"  . Headache(784.0)     "recurring"  . Restless leg syndrome   . Weakness of right side of body     "I've had PT for it; they don't know what it's from".  CORTISONE INJECTION INTO BACK 08/30/12  . Diabetes mellitus     diet controlled  .  Fibromyalgia   . Sciatic nerve pain     "from pinched nerve"  . Depression   . Atrial fibrillation     ASPIRIN FOR BLOOD THINNER  . Coronary artery disease   . GERD (gastroesophageal reflux disease)   . H/O hiatal hernia   . Complication of anesthesia     pt states has choking sensation with ET tube   . PONV (postoperative nausea and vomiting)   . Sleep apnea     cpap setting at 3 per patient   . Arthritis   . Bulging discs     lumbar     Assessment/Plan: 1 Day Post-Op Procedure(s) (LRB): RIGHT TOTAL KNEE ARTHROPLASTY (Right) Principal Problem:   OA (osteoarthritis) of knee  Estimated body mass index is 34.19 kg/(m^2) as calculated from the following:   Height as of this encounter: 5\' 2"  (1.575 m).   Weight as of this encounter: 84.823 kg (187 lb). Up with therapy D/C IV fluids Plan for discharge tomorrow Discharge home with home health  DVT Prophylaxis - Xarelto Weight-Bearing as tolerated to right leg No vaccines. D/C O2 and Pulse OX and try on Room Air  Trevaris Pennella 07/08/2013, 8:06 AM

## 2013-07-08 NOTE — Evaluation (Signed)
Occupational Therapy Evaluation Patient Details Name: Morgan [name] MRN: [id number] DOB: 1940/11/25 Today's Date: 07/08/2013 Time: 4098-1191 OT Time Calculation (min): 21 min  OT Assessment / Plan / Recommendation History of present illness 72 y.o. female with h/o fibromyalgia, RA admitted for R TKA.    Clinical Impression   Pt demos decline in function with ADLs and ADL mobility safety following R TKA. Pt would benefit from acute OT services to address impairments to help restore PLOF to return home safely    OT Assessment  Patient needs continued OT Services    Follow Up Recommendations  Home health OT;Supervision/Assistance - 24 hour    Barriers to Discharge   None  Equipment Recommendations       Recommendations for Other Services    Frequency  Min 2X/week    Precautions / Restrictions Precautions Precautions: Knee Required Braces or Orthoses: Knee Immobilizer - Right Restrictions Weight Bearing Restrictions: No Other Position/Activity Restrictions: WBAT   Pertinent Vitals/Pain 5/10    ADL  Grooming: Performed;Wash/dry hands;Wash/dry face;Brushing hair;Min guard Where Assessed - Grooming: Supported standing Upper Body Bathing: Simulated;Supervision/safety;Set up Lower Body Bathing: Simulated;Moderate assistance Upper Body Dressing: Performed;Supervision/safety;Set up Lower Body Dressing: Performed;Moderate assistance;Maximal assistance Toilet Transfer: Simulated;Minimal assistance Toilet Transfer Method: Sit to stand Toileting - Clothing Manipulation and Hygiene: Performed;Minimal assistance Where Assessed - Glass blower/designer Manipulation and Hygiene: Standing Tub/Shower Transfer Method: Not assessed Equipment Used: Rolling walker;Knee Immobilizer Transfers/Ambulation Related to ADLs: cues for safety, correct hand placement    OT Diagnosis: Generalized weakness;Acute pain  OT Problem List: Decreased strength;Decreased knowledge of use of DME or AE;Decreased  activity tolerance;Pain;Impaired balance (sitting and/or standing) OT Treatment Interventions: Self-care/ADL training;Patient/family education;Therapeutic activities;DME and/or AE instruction;Balance training;Neuromuscular education;Therapeutic exercise   OT Goals(Current goals can be found in the care plan section) Acute Rehab OT Goals Patient Stated Goal: to be able to walk OT Goal Formulation: With patient Time For Goal Achievement: 07/15/13 Potential to Achieve Goals: Good ADL Goals Pt Will Perform Grooming: with set-up;with supervision;standing Pt Will Perform Lower Body Bathing: with min assist;with caregiver independent in assisting;with adaptive equipment Pt Will Perform Lower Body Dressing: with min assist;with caregiver independent in assisting;with adaptive equipment Pt Will Transfer to Toilet: with min guard assist;with supervision;bedside commode Pt Will Perform Toileting - Clothing Manipulation and hygiene: with min guard assist;with supervision;sitting/lateral leans;sit to/from stand;with caregiver independent in assisting  Visit Information  Last OT Received On: 07/08/13 Assistance Needed: +1 History of Present Illness: 72 y.o. female with h/o fibromyalgia, RA admitted for R TKA.        Prior Functioning     Home Living Family/patient expects to be discharged to:: Private residence Living Arrangements: Spouse/significant other Available Help at Discharge: Family Type of Home: House Home Access: Stairs to enter Secretary/administrator of Steps: 1 Home Layout: One level Home Equipment: Cane - single point;Shower seat - built in;Grab bars - tub/shower;Crutches Prior Function Level of Independence: Independent Communication Communication: No difficulties Dominant Hand: Right         Vision/Perception Vision - History Baseline Vision: Wears glasses all the time Patient Visual Report: No change from baseline Perception Perception: Within Functional Limits    Cognition  Cognition Arousal/Alertness: Awake/alert Behavior During Therapy: WFL for tasks assessed/performed Overall Cognitive Status: Within Functional Limits for tasks assessed    Extremity/Trunk Assessment Upper Extremity Assessment Upper Extremity Assessment: Overall WFL for tasks assessed;Generalized weakness Cervical / Trunk Assessment Cervical / Trunk Assessment: Normal     Mobility Bed Mobility Bed Mobility: Not assessed  Supine to Sit: 4: Min assist Details for Bed Mobility Assistance: assist to support RLE Transfers Transfers: Sit to Stand;Stand to Sit Sit to Stand: 4: Min assist;From chair/3-in-1 Stand to Sit: 4: Min assist;To chair/3-in-1 Details for Transfer Assistance: VCs hand placement, assist to rise and steady        Balance Balance Balance Assessed: Yess Dynamic Standing Balance Dynamic Standing - Balance Support: During functional activity;Left upper extremity supported Dynamic Standing - Level of Assistance: 4: Min assist   End of Session OT - End of Session Equipment Utilized During Treatment: Rolling walker;Right knee immobilizer Activity Tolerance: Patient limited by lethargy;Other (comment) (dizziness) Patient left: in chair;with call bell/phone within reach;with family/visitor present CPM Right Knee CPM Right Knee: Off  GO     Margaretmary Eddy [facility] 07/08/2013, 12:20 PM

## 2013-07-08 NOTE — Plan of Care (Signed)
Problem: Phase II Progression Outcomes Goal: Discharge plan established Recommend HH OT for ADLs and ADL mobility safety trg after acute care d/c     

## 2013-07-09 DIAGNOSIS — D62 Acute posthemorrhagic anemia: Secondary | ICD-10-CM

## 2013-07-09 LAB — CBC
HCT: 27.5 % — ABNORMAL LOW (ref 36.0–46.0)
Hemoglobin: 9.1 g/dL — ABNORMAL LOW (ref 12.0–15.0)
MCH: 30 pg (ref 26.0–34.0)
MCHC: 33.1 g/dL (ref 30.0–36.0)
MCV: 90.8 fL (ref 78.0–100.0)
Platelets: 242 K/uL (ref 150–400)
RBC: 3.03 MIL/uL — ABNORMAL LOW (ref 3.87–5.11)
RDW: 13 % (ref 11.5–15.5)
WBC: 13.4 K/uL — ABNORMAL HIGH (ref 4.0–10.5)

## 2013-07-09 LAB — BASIC METABOLIC PANEL
Chloride: 104 mEq/L (ref 96–112)
GFR calc Af Amer: 68 mL/min — ABNORMAL LOW (ref >90)
Potassium: 3.7 mEq/L (ref 3.5–5.1)
Sodium: 140 mEq/L (ref 135–145)

## 2013-07-09 LAB — GLUCOSE, CAPILLARY
Glucose-Capillary: 207 mg/dL — ABNORMAL HIGH (ref 70–99)
Glucose-Capillary: 123 mg/dL — ABNORMAL HIGH (ref 70–99)

## 2013-07-09 MED ORDER — METHOCARBAMOL 500 MG PO TABS: 500.0000 mg | ORAL_TABLET | Freq: Four times a day (QID) | ORAL | Status: DC | PRN

## 2013-07-09 MED ORDER — HYDROMORPHONE HCL 2 MG PO TABS: 2.0000 mg | ORAL_TABLET | ORAL | Status: DC | PRN

## 2013-07-09 MED ORDER — TRAMADOL HCL 50 MG PO TABS: 50.0000 mg | ORAL_TABLET | Freq: Four times a day (QID) | ORAL | Status: DC | PRN

## 2013-07-09 MED ORDER — RIVAROXABAN 10 MG PO TABS: 10.0000 mg | ORAL_TABLET | Freq: Every day | ORAL | Status: DC

## 2013-07-09 NOTE — Progress Notes (Signed)
Physical Therapy Treatment Patient Details Name: Delila Kuklinski MRN: [id number] DOB: Nov 13, 1941 Today's Date: 07/09/2013 Time: 6213-0865 PT Time Calculation (min): 33 min  PT Assessment / Plan / Recommendation  History of Present Illness 72 y.o. female with h/o fibromyalgia, RA admitted for R TKA.    PT Comments   POD # 2 pm session.  Amb pt in hallway another time to address gait sequencing and R LE placement to decrease posterior LOB.  Advised daughter to be "hands on" when pt gets up/down and ambs due to mild unsteadiness and decreased safety cognition.  Pt's mobility is good to D/C to home today with "hands on" assist by family. Pt and family agree.    Follow Up Recommendations  Home health PT     Does the patient have the potential to tolerate intense rehabilitation     Barriers to Discharge        Equipment Recommendations  Rolling walker with 5" wheels    Recommendations for Other Services    Frequency 7X/week   Progress towards PT Goals Progress towards PT goals: Progressing toward goals  Plan      Precautions / Restrictions Precautions Precautions: Knee Precaution Comments: Instructed pt on KI use for amb Required Braces or Orthoses: Knee Immobilizer - Right Restrictions Weight Bearing Restrictions: No Other Position/Activity Restrictions: WBAT   Pertinent Vitals/Pain  C/o 4/10 pain with act ICE applied    Mobility  Bed Mobility Bed Mobility: Not assessed Supine to Sit: 4: Min guard Details for Bed Mobility Assistance: pt OOB in recliner Transfers Transfers: Sit to Stand;Stand to Sit Sit to Stand: 5: Supervision;4: Min guard;From chair/3-in-1 Stand to Sit: 5: Supervision;4: Min guard;To chair/3-in-1 Details for Transfer Assistance: 25% VC's on proper tech and hand placement plus increased time Ambulation/Gait Ambulation/Gait Assistance: 4: Min guard Ambulation Distance (Feet): 125 Feet Assistive device: Rolling walker Ambulation/Gait Assistance  Details: increased time and 50% VC's on proper placement of R LE in middle vs front RW to decrease posterior LOB.   Gait Pattern: Step-to pattern;Decreased step length - right;Decreased step length - left;Antalgic Gait velocity: decreased    . PT Goals (current goals can now be found in the care plan section)    Visit Information  Last PT Received On: 07/09/13 Assistance Needed: +1 History of Present Illness: 72 y.o. female with h/o fibromyalgia, RA admitted for R TKA.     Subjective Data      Cognition       Balance     End of Session PT - End of Session Equipment Utilized During Treatment: Gait belt Activity Tolerance: Patient tolerated treatment well Patient left: in chair;with call bell/phone within reach;with chair alarm set   Felecia Shelling  PTA [facility]  Acute  Rehab Pager      [phone]

## 2013-07-09 NOTE — Progress Notes (Signed)
Subjective: 2 Days Post-Op Procedure(s) (LRB): RIGHT TOTAL KNEE ARTHROPLASTY (Right) Patient reports pain as mild.   Patient seen in rounds for Dr. Lequita Halt. Patient is well, but has had some minor complaints of pain in the knee, requiring pain medications Plan is to go Home after hospital stay.  Objective: Vital signs in last 24 hours: Temp:  [97.7 F (36.5 C)-98.5 F (36.9 C)] 98.5 F (36.9 C) (08/27 0551) Pulse Rate:  [52-68] 68 (08/27 0934) Resp:  [16-18] 16 (08/27 0551) BP: (101-130)/(57-70) 121/57 mmHg (08/27 0934) SpO2:  [94 %-95 %] 94 % (08/27 0551)  Intake/Output from previous day:  Intake/Output Summary (Last 24 hours) at 07/09/13 1228 Last data filed at 07/09/13 0900  Gross per 24 hour  Intake    720 ml  Output   1075 ml  Net   -355 ml    Intake/Output this shift: Total I/O In: 240 [P.O.:240] Out: -   Labs:  Recent Labs  07/08/13 0418 07/09/13 0455  HGB 10.3* 9.1*    Recent Labs  07/08/13 0418 07/09/13 0455  WBC 10.9* 13.4*  RBC 3.44* 3.03*  HCT 30.9* 27.5*  PLT 225 242    Recent Labs  07/08/13 0418 07/09/13 0455  NA 136 140  K 3.9 3.7  CL 101 104  CO2 28 27  BUN 25* 25*  CREATININE 0.91 0.95  GLUCOSE 178* 181*  CALCIUM 8.5 8.2*   No results found for this basename: LABPT, INR,  in the last 72 hours  EXAM General - Patient is Alert, Appropriate and Oriented Extremity - Neurovascular intact Sensation intact distally No cellulitis present Dressing/Incision - clean, dry, no drainage, healing Motor Function - intact, moving foot and toes well on exam.   Past Medical History  Diagnosis Date  . Hypertension   . High cholesterol   . Hyperlipidemia   . Angina   . Migraines     "til ~ 1980"  . Headache(784.0)     "recurring"  . Restless leg syndrome   . Weakness of right side of body     "I've had PT for it; they don't know what it's from".  CORTISONE INJECTION INTO BACK 08/30/12  . Diabetes mellitus     diet controlled  .  Fibromyalgia   . Sciatic nerve pain     "from pinched nerve"  . Depression   . Atrial fibrillation     ASPIRIN FOR BLOOD THINNER  . Coronary artery disease   . GERD (gastroesophageal reflux disease)   . H/O hiatal hernia   . Complication of anesthesia     pt states has choking sensation with ET tube   . PONV (postoperative nausea and vomiting)   . Sleep apnea     cpap setting at 3 per patient   . Arthritis   . Bulging discs     lumbar     Assessment/Plan: 2 Days Post-Op Procedure(s) (LRB): RIGHT TOTAL KNEE ARTHROPLASTY (Right) Principal Problem:   OA (osteoarthritis) of knee Active Problems:   Postoperative anemia due to acute blood loss  Estimated body mass index is 34.19 kg/(m^2) as calculated from the following:   Height as of this encounter: 5\' 2"  (1.575 m).   Weight as of this encounter: 84.823 kg (187 lb). Up with therapy Discharge home with home health when met goals.  Slowly progressing with therapy.  See how she does today.  DVT Prophylaxis - Xarelto Weight-Bearing as tolerated to right leg  PERKINS, ALEXZANDREW 07/09/2013, 12:28 PM

## 2013-07-09 NOTE — Progress Notes (Signed)
Physical Therapy Treatment Patient Details Name: Morgan [name] MRN: [id number] DOB: December 29, 1940 Today's Date: 07/09/2013 Time: 4098-1191 PT Time Calculation (min): 13 min  PT Assessment / Plan / Recommendation  History of Present Illness 72 y.o. female with h/o fibromyalgia, RA admitted for R TKA.    PT Comments   POD # 2 am session.  Pt anxious about getting OOB to walk.  Called to room by RN.  Applied KI and instructed pt on use and assisted pt OOB to amb in hallway.    Follow Up Recommendations  Home health PT     Does the patient have the potential to tolerate intense rehabilitation     Barriers to Discharge        Equipment Recommendations  Rolling walker with 5" wheels    Recommendations for Other Services    Frequency 7X/week   Progress towards PT Goals Progress towards PT goals: Progressing toward goals  Plan      Precautions / Restrictions Precautions Precautions: Knee Precaution Comments: Instructed pt on KI use for amb Required Braces or Orthoses: Knee Immobilizer - Right Restrictions Weight Bearing Restrictions: No Other Position/Activity Restrictions: WBAT    Pertinent Vitals/Pain C/o 5/10 during gait    Mobility  Bed Mobility Bed Mobility: Supine to Sit;Sit to Supine Supine to Sit: 4: Min guard Details for Bed Mobility Assistance: MinGuard assist for R LE off bed plus increased time Transfers Transfers: Sit to Stand;Stand to Sit Sit to Stand: 5: Supervision;4: Min guard;From bed Stand to Sit: 5: Supervision;4: Min guard;To chair/3-in-1 Details for Transfer Assistance: 50% VC's on proper tech and hand placement plus increased time Ambulation/Gait Ambulation Distance (Feet): 75 Feet Assistive device: Rolling walker Ambulation/Gait Assistance Details: 25% VC's on proper walker to self distance and increased time Gait Pattern: Step-to pattern;Decreased step length - right;Decreased step length - left;Antalgic Gait velocity: decreased    PT Goals  (current goals can now be found in the care plan section)    Visit Information  Last PT Received On: 07/09/13 Assistance Needed: +1 History of Present Illness: 72 y.o. female with h/o fibromyalgia, RA admitted for R TKA.     Subjective Data      Cognition       Balance     End of Session PT - End of Session Equipment Utilized During Treatment: Gait belt Activity Tolerance: Patient tolerated treatment well Patient left: in chair;with call bell/phone within reach;with chair alarm set   Felecia Shelling  PTA [facility]  Acute  Rehab Pager      [phone]

## 2013-07-09 NOTE — Progress Notes (Signed)
Occupational Therapy Treatment Patient Details Name: Morgan [name] MRN: [id number] DOB: [birth year] Today's Date: 07/09/2013 Time: 7829-5621 OT Time Calculation (min): 25 min  OT Assessment / Plan / Recommendation  History of present illness 72 y.o. female with h/o fibromyalgia, RA admitted for R TKA.    OT comments  Progressing; cues for safety during OT session  Follow Up Recommendations  Supervision/Assistance - 24 hour;No OT follow up    Barriers to Discharge       Equipment Recommendations  None recommended by OT (has tub bench in shower; handicapped height commode)    Recommendations for Other Services    Frequency Min 2X/week   Progress towards OT Goals Progress towards OT goals: Progressing toward goals  Plan Discharge plan remains appropriate    Precautions / Restrictions Precautions Precautions: Knee Required Braces or Orthoses: Knee Immobilizer - Right Restrictions Weight Bearing Restrictions: No Other Position/Activity Restrictions: WBAT   Pertinent Vitals/Pain 5/10 with weight bearing R knee    ADL  Grooming: Performed;Teeth care;Supervision/safety Where Assessed - Grooming: Supported standing Lower Body Bathing: Minimal assistance Where Assessed - Lower Body Bathing: Supported sit to stand Lower Body Dressing: Minimal assistance (pants only with reacher) Where Assessed - Lower Body Dressing: Supported sit to Pharmacist, hospital: Hydrographic surveyor Method: Sit to Barista: Comfort height toilet;Grab bars Toileting - Architect and Hygiene: Min guard Where Assessed - Engineer, mining and Hygiene: Sit to stand from 3-in-1 or toilet Equipment Used: Rolling walker Transfers/Ambulation Related to ADLs: pt states she hasn't been using KI.  Lifting RLE somewhat during OT, may benefit from wearing this.  Cues for safety. ADL Comments: Pt has a reacher at home:  instructed in use for adls.  Educated on  shower transfer:  pt doesn't feel she will need to practice this.      OT Diagnosis:    OT Problem List:   OT Treatment Interventions:     OT Goals(current goals can now be found in the care plan section)    Visit Information  Last OT Received On: 07/09/13 Assistance Needed: +1 History of Present Illness: 72 y.o. female with h/o fibromyalgia, RA admitted for R TKA.     Subjective Data      Prior Functioning       Cognition  Cognition Arousal/Alertness: Awake/alert Behavior During Therapy: WFL for tasks assessed/performed Overall Cognitive Status: Within Functional Limits for tasks assessed    Mobility  Bed Mobility Supine to Sit: 5: Supervision;With rails;HOB elevated Sit to Supine: 4: Min assist Details for Bed Mobility Assistance: pt assisted RLE herself getting OOB and therapist assisted back to bed Transfers Sit to Stand: 4: Min guard;From bed;From toilet Stand to Sit: 4: Min guard;To bed;To toilet Details for Transfer Assistance: cues for safety, hand and leg placement    Exercises      Balance     End of Session OT - End of Session Activity Tolerance: Patient tolerated treatment well Patient left: in bed;with call bell/phone within reach;with bed alarm set CPM Right Knee CPM Right Knee: Off  GO     Dajohn Ellender 07/09/2013, 8:32 AM Marica Otter, OTR/L [phone] 07/09/2013

## 2013-07-09 NOTE — Care Management Note (Unsigned)
    Page 1 of 2   07/09/2013     5:35:31 PM   CARE MANAGEMENT NOTE 07/09/2013  Patient:  Morgan [name], Morgan [name]   Account Number:  [id number]  Date Initiated:  07/09/2013  Documentation initiated by:  Colleen Can  Subjective/Objective Assessment:   DX TOTAL RT KNEE REPLACEMNT     Action/Plan:   CM SPOKE WITH PATIENT. pLANS ARE FOR HER TO RETURN TO HER HOME [place] [facility] [place] WHERE SHE WILL HAVE CAREGIVER. WIL NEED RW. [facility] WILL PROVIDE hhPT   Anticipated DC Date:  07/09/2013   Anticipated DC Plan:  HOME W HOME HEALTH SERVICES      DC Planning Services  CM consult      PAC Choice  DURABLE MEDICAL EQUIPMENT  HOME HEALTH   Choice offered to / List presented to:  C-1 Patient      DME agency  [facility]     HH arranged  HH-2 PT      [facility] agency  [facility]   Status of service:  In process, will continue to follow Medicare Important Message given?   (If response is "NO", the following Medicare IM given date fields will be blank) Date Medicare IM given:   Date Additional Medicare IM given:    Discharge Disposition:    Per UR Regulation:    If discussed at Long Length of Stay Meetings, dates discussed:    Comments:  07/09/2013 Colleen Can BSN RN CCM [phone] dme REQUEST FAXED TO [facility] WITH CONFIRMATION RECEIVED-[phone]. CM spoke with Renae Fickle who was advised that RW needs to be delivered to pt's room at [facility] long; currently unable to give ETA of this delivery. Advised that pt would be discharged this PM.

## 2013-07-09 NOTE — Progress Notes (Addendum)
Physical Therapy Treatment Patient Details Name: Morgan [name] MRN: [id number] DOB: 1941/01/12 Today's Date: 07/09/2013 Time: 11:33 - 11:50 PT Time Calculation (min): 13 min  PT Assessment / Plan / Recommendation  History of Present Illness 72 y.o. female with h/o fibromyalgia, RA admitted for R TKA.    PT Comments   POD # 2 still am pt OOB in recliner so performed TKR TE's then applied ICE.  Follow Up Recommendations  Home health PT     Does the patient have the potential to tolerate intense rehabilitation     Barriers to Discharge        Equipment Recommendations  Rolling walker with 5" wheels    Recommendations for Other Services    Frequency 7X/week   Progress towards PT Goals Progress towards PT goals: Progressing toward goals  Plan      Precautions / Restrictions Precautions Precautions: Knee Precaution Comments: Instructed pt on KI use for amb Required Braces or Orthoses: Knee Immobilizer - Right Restrictions Weight Bearing Restrictions: No Other Position/Activity Restrictions: WBAT    Pertinent Vitals/Pain C/o 8/10 during TE's ICE applied       Exercises   Total Knee Replacement TE's 10 reps B LE ankle pumps 10 reps knee presses 10 reps heel slides  10 reps SAQ's 10 reps SLR's 10 reps ABD Followed by ICE   PT Goals (current goals can now be found in the care plan section)    Visit Information  Last PT Received On: 07/09/13 Assistance Needed: +1 History of Present Illness: 72 y.o. female with h/o fibromyalgia, RA admitted for R TKA.     Subjective Data      Cognition       Balance     End of Session PT - End of Session Equipment Utilized During Treatment: Gait belt Activity Tolerance: Patient tolerated treatment well Patient left: in chair;with call bell/phone within reach;with chair alarm set   Felecia Shelling  PTA [facility]  Acute  Rehab Pager      [phone]

## 2013-07-09 NOTE — Progress Notes (Signed)
Subjective: 2 Days Post-Op Procedure(s) (LRB): RIGHT TOTAL KNEE ARTHROPLASTY (Right) Patient reports pain as mild.   Patient seen in rounds for Dr. Lequita Halt. Patient is well, but has had some minor complaints of pain in the knee, requiring pain medications . She slipped and fell in her room earlier but she has walked since then and no increase in pain.  She wants to go home. Patient is ready to go home.  Objective: Vital signs in last 24 hours: Temp:  [97.7 F (36.5 C)-98.5 F (36.9 C)] 98.5 F (36.9 C) (08/27 0551) Pulse Rate:  [66-68] 68 (08/27 0934) Resp:  [16] 16 (08/27 0551) BP: (111-130)/(57-70) 121/57 mmHg (08/27 0934) SpO2:  [94 %-95 %] 94 % (08/27 0551)  Intake/Output from previous day:  Intake/Output Summary (Last 24 hours) at 07/09/13 1701 Last data filed at 07/09/13 0900  Gross per 24 hour  Intake    480 ml  Output   1075 ml  Net   -595 ml    Intake/Output this shift: Total I/O In: 240 [P.O.:240] Out: -   Labs:  Recent Labs  07/08/13 0418 07/09/13 0455  HGB 10.3* 9.1*    Recent Labs  07/08/13 0418 07/09/13 0455  WBC 10.9* 13.4*  RBC 3.44* 3.03*  HCT 30.9* 27.5*  PLT 225 242    Recent Labs  07/08/13 0418 07/09/13 0455  NA 136 140  K 3.9 3.7  CL 101 104  CO2 28 27  BUN 25* 25*  CREATININE 0.91 0.95  GLUCOSE 178* 181*  CALCIUM 8.5 8.2*   No results found for this basename: LABPT, INR,  in the last 72 hours  EXAM: General - Patient is Alert, Appropriate and Oriented Extremity - Neurovascular intact Sensation intact distally Dorsiflexion/Plantar flexion intact No cellulitis present Incision - clean, dry, no drainage, healing Motor Function - intact, moving foot and toes well on exam.   Assessment/Plan: 2 Days Post-Op Procedure(s) (LRB): RIGHT TOTAL KNEE ARTHROPLASTY (Right) Procedure(s) (LRB): RIGHT TOTAL KNEE ARTHROPLASTY (Right) Past Medical History  Diagnosis Date  . Hypertension   . High cholesterol   . Hyperlipidemia    . Angina   . Migraines     "til ~ 1980"  . Headache(784.0)     "recurring"  . Restless leg syndrome   . Weakness of right side of body     "I've had PT for it; they don't know what it's from".  CORTISONE INJECTION INTO BACK 08/30/12  . Diabetes mellitus     diet controlled  . Fibromyalgia   . Sciatic nerve pain     "from pinched nerve"  . Depression   . Atrial fibrillation     ASPIRIN FOR BLOOD THINNER  . Coronary artery disease   . GERD (gastroesophageal reflux disease)   . H/O hiatal hernia   . Complication of anesthesia     pt states has choking sensation with ET tube   . PONV (postoperative nausea and vomiting)   . Sleep apnea     cpap setting at 3 per patient   . Arthritis   . Bulging discs     lumbar    Principal Problem:   OA (osteoarthritis) of knee Active Problems:   Postoperative anemia due to acute blood loss  Estimated body mass index is 34.19 kg/(m^2) as calculated from the following:   Height as of this encounter: 5\' 2"  (1.575 m).   Weight as of this encounter: 84.823 kg (187 lb). Discharge home with home health Diet - Cardiac  diet and Diabetic diet Follow up - in 2 weeks Activity - WBAT Disposition - Home Condition Upon Discharge - Stable D/C Meds - See DC Summary DVT Prophylaxis - Xarelto  PERKINS, ALEXZANDREW 07/09/2013, 5:01 PM

## 2013-07-24 NOTE — Discharge Summary (Signed)
Physician Discharge Summary   Patient ID: Morgan [name] MRN: [id number] DOB/AGE: 1941-07-31 72 y.o.  Admit date: 07/07/2013 Discharge date: 07/09/2013  Primary Diagnosis:  Osteoarthritis Right knee(s)  Admission Diagnoses:  Past Medical History  Diagnosis Date  . Hypertension   . High cholesterol   . Hyperlipidemia   . Angina   . Migraines     "til ~ 1980"  . Headache(784.0)     "recurring"  . Restless leg syndrome   . Weakness of right side of body     "I've had PT for it; they don't know what it's from".  CORTISONE INJECTION INTO BACK 08/30/12  . Diabetes mellitus     diet controlled  . Fibromyalgia   . Sciatic nerve pain     "from pinched nerve"  . Depression   . Atrial fibrillation     ASPIRIN FOR BLOOD THINNER  . Coronary artery disease   . GERD (gastroesophageal reflux disease)   . H/O hiatal hernia   . Complication of anesthesia     pt states has choking sensation with ET tube   . PONV (postoperative nausea and vomiting)   . Sleep apnea     cpap setting at 3 per patient   . Arthritis   . Bulging discs     lumbar    Discharge Diagnoses:   Principal Problem:   OA (osteoarthritis) of knee Active Problems:   Postoperative anemia due to acute blood loss  Estimated body mass index is 34.19 kg/(m^2) as calculated from the following:   Height as of this encounter: 5\' 2"  (1.575 m).   Weight as of this encounter: 84.823 kg (187 lb).  Procedure:  Procedure(s) (LRB): RIGHT TOTAL KNEE ARTHROPLASTY (Right)   Consults: None  HPI: Morgan [name] is a 72 y.o. year old female with end stage OA of her right knee with progressively worsening pain and dysfunction. She has constant pain, with activity and at rest and significant functional deficits with difficulties even with ADLs. She has had extensive non-op management including analgesics, injections of cortisone and viscosupplements, and home exercise program, but remains in significant pain with significant  dysfunction.Radiographs show bone on bone arthritis lateral. She presents now for right Total Knee Arthroplasty.   Laboratory Data: Admission on 07/07/2013, Discharged on 07/09/2013  Component Date Value Range Status  . Glucose-Capillary 07/07/2013 122* 70 - 99 mg/dL Final  . Comment 1 91/47/8295 Documented in Chart   Final  . Comment 2 07/07/2013 Notify RN   Final  . WBC 07/08/2013 10.9* 4.0 - 10.5 K/uL Final  . RBC 07/08/2013 3.44* 3.87 - 5.11 MIL/uL Final  . Hemoglobin 07/08/2013 10.3* 12.0 - 15.0 g/dL Final  . HCT 62/13/0865 30.9* 36.0 - 46.0 % Final  . MCV 07/08/2013 89.8  78.0 - 100.0 fL Final  . MCH 07/08/2013 29.9  26.0 - 34.0 pg Final  . MCHC 07/08/2013 33.3  30.0 - 36.0 g/dL Final  . RDW 78/46/9629 12.8  11.5 - 15.5 % Final  . Platelets 07/08/2013 225  150 - 400 K/uL Final  . Sodium 07/08/2013 136  135 - 145 mEq/L Final  . Potassium 07/08/2013 3.9  3.5 - 5.1 mEq/L Final  . Chloride 07/08/2013 101  96 - 112 mEq/L Final  . CO2 07/08/2013 28  19 - 32 mEq/L Final  . Glucose, Bld 07/08/2013 178* 70 - 99 mg/dL Final  . BUN 52/84/1324 25* 6 - 23 mg/dL Final  . Creatinine, Ser 07/08/2013 0.91  0.50 - 1.10 mg/dL  Final  . Calcium 07/08/2013 8.5  8.4 - 10.5 mg/dL Final  . GFR calc non Af Amer 07/08/2013 62* >90 mL/min Final  . GFR calc Af Amer 07/08/2013 72* >90 mL/min Final   Comment: (NOTE)                          The eGFR has been calculated using the CKD EPI equation.                          This calculation has not been validated in all clinical situations.                          eGFR's persistently <90 mL/min signify possible Chronic Kidney                          Disease.  . Glucose-Capillary 07/07/2013 168* 70 - 99 mg/dL Final  . Glucose-Capillary 07/07/2013 228* 70 - 99 mg/dL Final  . Comment 1 81/19/1478 Notify RN   Final  . Glucose-Capillary 07/08/2013 142* 70 - 99 mg/dL Final  . Glucose-Capillary 07/08/2013 179* 70 - 99 mg/dL Final  . Comment 1 29/56/2130 Notify  RN   Final  . Comment 2 07/08/2013 Documented in Chart   Final  . WBC 07/09/2013 13.4* 4.0 - 10.5 K/uL Final   WHITE COUNT CONFIRMED ON SMEAR  . RBC 07/09/2013 3.03* 3.87 - 5.11 MIL/uL Final  . Hemoglobin 07/09/2013 9.1* 12.0 - 15.0 g/dL Final  . HCT 86/57/8469 27.5* 36.0 - 46.0 % Final  . MCV 07/09/2013 90.8  78.0 - 100.0 fL Final  . MCH 07/09/2013 30.0  26.0 - 34.0 pg Final  . MCHC 07/09/2013 33.1  30.0 - 36.0 g/dL Final  . RDW 62/95/2841 13.0  11.5 - 15.5 % Final  . Platelets 07/09/2013 242  150 - 400 K/uL Final  . Sodium 07/09/2013 140  135 - 145 mEq/L Final  . Potassium 07/09/2013 3.7  3.5 - 5.1 mEq/L Final  . Chloride 07/09/2013 104  96 - 112 mEq/L Final  . CO2 07/09/2013 27  19 - 32 mEq/L Final  . Glucose, Bld 07/09/2013 181* 70 - 99 mg/dL Final  . BUN 32/44/0102 25* 6 - 23 mg/dL Final  . Creatinine, Ser 07/09/2013 0.95  0.50 - 1.10 mg/dL Final  . Calcium 72/53/6644 8.2* 8.4 - 10.5 mg/dL Final  . GFR calc non Af Amer 07/09/2013 59* >90 mL/min Final  . GFR calc Af Amer 07/09/2013 68* >90 mL/min Final   Comment: (NOTE)                          The eGFR has been calculated using the CKD EPI equation.                          This calculation has not been validated in all clinical situations.                          eGFR's persistently <90 mL/min signify possible Chronic Kidney                          Disease.  . Glucose-Capillary 07/08/2013 213* 70 - 99 mg/dL Final  . Comment 1 03/47/4259  Notify RN   Final  . Comment 2 07/08/2013 Documented in Chart   Final  . Glucose-Capillary 07/08/2013 207* 70 - 99 mg/dL Final  . Comment 1 14/78/2956 Notify RN   Final  . Glucose-Capillary 07/09/2013 153* 70 - 99 mg/dL Final  . Comment 1 21/30/8657 Notify RN   Final  . Glucose-Capillary 07/09/2013 180* 70 - 99 mg/dL Final  . Comment 1 84/69/6295 Documented in Chart   Final  . Comment 2 07/09/2013 Notify RN   Final  . Glucose-Capillary 07/09/2013 123* 70 - 99 mg/dL Final  Hospital  Outpatient Visit on 07/01/2013  Component Date Value Range Status  . ABO/RH(D) 07/01/2013 A POS   Final  Hospital Outpatient Visit on 07/01/2013  Component Date Value Range Status  . aPTT 07/01/2013 27  24 - 37 seconds Final  . WBC 07/01/2013 6.8  4.0 - 10.5 K/uL Final  . RBC 07/01/2013 4.28  3.87 - 5.11 MIL/uL Final  . Hemoglobin 07/01/2013 12.7  12.0 - 15.0 g/dL Final  . HCT 28/41/3244 38.7  36.0 - 46.0 % Final  . MCV 07/01/2013 90.4  78.0 - 100.0 fL Final  . MCH 07/01/2013 29.7  26.0 - 34.0 pg Final  . MCHC 07/01/2013 32.8  30.0 - 36.0 g/dL Final  . RDW 11/15/7251 12.9  11.5 - 15.5 % Final  . Platelets 07/01/2013 277  150 - 400 K/uL Final  . Sodium 07/01/2013 140  135 - 145 mEq/L Final  . Potassium 07/01/2013 3.4* 3.5 - 5.1 mEq/L Final  . Chloride 07/01/2013 99  96 - 112 mEq/L Final  . CO2 07/01/2013 32  19 - 32 mEq/L Final  . Glucose, Bld 07/01/2013 167* 70 - 99 mg/dL Final  . BUN 66/44/0347 21  6 - 23 mg/dL Final  . Creatinine, Ser 07/01/2013 0.84  0.50 - 1.10 mg/dL Final  . Calcium 42/59/5638 10.1  8.4 - 10.5 mg/dL Final  . Total Protein 07/01/2013 6.8  6.0 - 8.3 g/dL Final  . Albumin 75/64/3329 3.5  3.5 - 5.2 g/dL Final  . AST 51/88/4166 22  0 - 37 U/L Final  . ALT 07/01/2013 25  0 - 35 U/L Final  . Alkaline Phosphatase 07/01/2013 96  39 - 117 U/L Final  . Total Bilirubin 07/01/2013 0.5  0.3 - 1.2 mg/dL Final  . GFR calc non Af Amer 07/01/2013 68* >90 mL/min Final  . GFR calc Af Amer 07/01/2013 79* >90 mL/min Final   Comment: (NOTE)                          The eGFR has been calculated using the CKD EPI equation.                          This calculation has not been validated in all clinical situations.                          eGFR's persistently <90 mL/min signify possible Chronic Kidney                          Disease.  [name] Prothrombin Time 07/01/2013 13.1  11.6 - 15.2 seconds Final  . INR 07/01/2013 1.01  0.00 - 1.49 Final  . ABO/RH(D) 07/01/2013 A POS   Final  .  Antibody Screen 07/01/2013 NEG   Final  . Sample Expiration 07/01/2013  07/10/2013   Final  . Color, Urine 07/01/2013 YELLOW  YELLOW Final  . APPearance 07/01/2013 CLEAR  CLEAR Final  . Specific Gravity, Urine 07/01/2013 1.022  1.005 - 1.030 Final  . pH 07/01/2013 6.0  5.0 - 8.0 Final  . Glucose, UA 07/01/2013 NEGATIVE  NEGATIVE mg/dL Final  . Hgb urine dipstick 07/01/2013 NEGATIVE  NEGATIVE Final  . Bilirubin Urine 07/01/2013 NEGATIVE  NEGATIVE Final  . Ketones, ur 07/01/2013 NEGATIVE  NEGATIVE mg/dL Final  . Protein, ur 08/65/7846 NEGATIVE  NEGATIVE mg/dL Final  . Urobilinogen, UA 07/01/2013 1.0  0.0 - 1.0 mg/dL Final  . Nitrite 96/29/5284 NEGATIVE  NEGATIVE Final  . Leukocytes, UA 07/01/2013 SMALL* NEGATIVE Final  . MRSA, PCR 07/01/2013 NEGATIVE  NEGATIVE Final  . Staphylococcus aureus 07/01/2013 NEGATIVE  NEGATIVE Final   Comment:                                 The Xpert SA Assay (FDA                          approved for NASAL specimens                          in patients over 52 years of age),                          is one component of                          a comprehensive surveillance                          program.  Test performance has                          been validated by [employer] for patients greater                          than or equal to 19 year old.                          It is not intended                          to diagnose infection nor to                          guide or monitor treatment.  . Squamous Epithelial / LPF 07/01/2013 RARE  RARE Final  . WBC, UA 07/01/2013 3-6  <3 WBC/hpf Final  . Bacteria, UA 07/01/2013 RARE  RARE Final  Office Visit on 07/01/2013  Component Date Value Range Status  . HM Diabetic Eye Exam 03/31/2012 done   Final  . HM Diabetic Foot Exam 07/01/2013 done   Final  Lab on 06/24/2013  Component Date Value Range Status  . Hemoglobin A1C 06/24/2013 8.4* 4.6 - 6.5 % Final   Glycemic Control  Guidelines for People with Diabetes:Non Diabetic:  <  6%Goal of Therapy: <7%Additional Action Suggested:  >8%   . Sodium 06/24/2013 139  135 - 145 mEq/L Final  . Potassium 06/24/2013 3.9  3.5 - 5.1 mEq/L Final  . Chloride 06/24/2013 100  96 - 112 mEq/L Final  . CO2 06/24/2013 30  19 - 32 mEq/L Final  . Glucose, Bld 06/24/2013 167* 70 - 99 mg/dL Final  . BUN 16/08/9603 21  6 - 23 mg/dL Final  . Creatinine, Ser 06/24/2013 1.0  0.4 - 1.2 mg/dL Final  . Total Bilirubin 06/24/2013 0.9  0.3 - 1.2 mg/dL Final  . Alkaline Phosphatase 06/24/2013 82  39 - 117 U/L Final  . AST 06/24/2013 27  0 - 37 U/L Final  . ALT 06/24/2013 33  0 - 35 U/L Final  . Total Protein 06/24/2013 6.9  6.0 - 8.3 g/dL Final  . Albumin 54/07/8118 3.6  3.5 - 5.2 g/dL Final  . Calcium 14/78/2956 9.6  8.4 - 10.5 mg/dL Final  . GFR 21/30/8657 55.48* >60.00 mL/min Final  . Cholesterol 06/24/2013 132  0 - 200 mg/dL Final   ATP III Classification       Desirable:  < 200 mg/dL               Borderline High:  200 - 239 mg/dL          High:  > = 846 mg/dL  . Triglycerides 06/24/2013 115.0  0.0 - 149.0 mg/dL Final   Normal:  <962 mg/dLBorderline High:  150 - 199 mg/dL  . HDL 06/24/2013 48.00  >39.00 mg/dL Final  . VLDL 95/28/4132 23.0  0.0 - 40.0 mg/dL Final  . LDL Cholesterol 06/24/2013 61  0 - 99 mg/dL Final  . Total CHOL/HDL Ratio 06/24/2013 3   Final                  Men          Women1/2 Average Risk     3.4          3.3Average Risk          5.0          4.42X Average Risk          9.6          7.13X Average Risk          15.0          11.0                      . Microalb, Ur 06/24/2013 1.0  0.0 - 1.9 mg/dL Final  . Creatinine,U 44/11/270 113.7   Final  . Microalb Creat Ratio 06/24/2013 0.9  0.0 - 30.0 mg/g Final     X-Rays:Dg Chest 2 View  07/01/2013   *RADIOLOGY REPORT*  Clinical Data: Hypertension  CHEST - 2 VIEW  Comparison: March 07, 2012.  Findings: Sternotomy wires are noted.  Cardiomediastinal silhouette appears normal.   No acute pulmonary disease is noted.  No pneumothorax or pleural effusion is noted.  IMPRESSION: No acute cardiopulmonary abnormality seen.   Original Report Authenticated By: Lupita Raider.,  M.D.    EKG: Orders placed in visit on 05/20/13  . EKG     Hospital Course: Morgan [name] is a 72 y.o. who was admitted to [facility]. They were brought to the operating room on 07/07/2013 and underwent Procedure(s): RIGHT TOTAL KNEE ARTHROPLASTY.  Patient tolerated the procedure well and was later transferred to the recovery  room and then to the orthopaedic floor for postoperative care.  They were given PO and IV analgesics for pain control following their surgery.  They were given 24 hours of postoperative antibiotics of  Anti-infectives   Start     Dose/Rate Route Frequency Ordered Stop   07/07/13 1800  ceFAZolin (ANCEF) IVPB 1 g/50 mL premix     1 g 100 mL/hr over 30 Minutes Intravenous Every 6 hours 07/07/13 1626 07/08/13 0108   07/07/13 0945  ceFAZolin (ANCEF) IVPB 2 g/50 mL premix     2 g 100 mL/hr over 30 Minutes Intravenous On call to O.R. 07/07/13 4696 07/07/13 1303     and started on DVT prophylaxis in the form of Xarelto.   PT and OT were ordered for total joint protocol.  Discharge planning consulted to help with postop disposition and equipment needs.  Patient had a good night on the evening of surgery.  Resumed the CPAP.  They started to get up OOB with therapy on day one. Hemovac drain was pulled without difficulty.  Continued to work with therapy into day two.  Dressing was changed on day two and the incision was healing well.   Patient was seen in rounds a second time after a fall in her room earlier but she had walked since then and no increase in pain. She wanted to go home.   Discharge Medications: Prior to Admission medications   Medication Sig Start Date End Date Taking? Authorizing Provider  atorvastatin (LIPITOR) 80 MG tablet Take 80 mg by mouth every morning.     Yes Historical Provider, MD  cetirizine (ZYRTEC) 10 MG tablet Take 10 mg by mouth daily.   Yes Historical Provider, MD  clonazePAM (KLONOPIN) 1 MG tablet Take 2 mg by mouth at bedtime as needed for anxiety (sleep).   Yes Historical Provider, MD  metFORMIN (GLUCOPHAGE-XR) 750 MG 24 hr tablet Take 1 tablet (750 mg total) by mouth daily. 07/01/13  Yes Amy Michelle Nasuti, MD  metoprolol succinate (TOPROL-XL) 100 MG 24 hr tablet Take 100 mg by mouth at bedtime. Take with or immediately following a meal.   Yes Historical Provider, MD  venlafaxine XR (EFFEXOR-XR) 37.5 MG 24 hr capsule Take 75 mg by mouth daily. pateint takes in am   Yes Historical Provider, MD  EPINEPHrine (EPI-PEN) 0.3 mg/0.3 mL SOAJ injection Inject into the muscle once as needed.    Historical Provider, MD  gabapentin (NEURONTIN) 300 MG capsule Take 600 mg by mouth at bedtime.    Historical Provider, MD  hydrochlorothiazide (HYDRODIURIL) 25 MG tablet Take 25 mg by mouth every morning.    Historical Provider, MD  HYDROmorphone (DILAUDID) 2 MG tablet Take 1-2 tablets (2-4 mg total) by mouth every 4 (four) hours as needed. 07/09/13   Alexzandrew Julien Girt, PA-C  methocarbamol (ROBAXIN) 500 MG tablet Take 1 tablet (500 mg total) by mouth every 6 (six) hours as needed. 07/09/13   Alexzandrew Perkins, PA-C  Multiple Vitamins-Minerals (PRESERVISION AREDS PO) Take 1 tablet by mouth 2 (two) times daily.     Historical Provider, MD  nitroGLYCERIN (NITROSTAT) 0.4 MG SL tablet Place 0.4 mg under the tongue every 5 (five) minutes as needed. For chest pain    Historical Provider, MD  pantoprazole (PROTONIX) 40 MG tablet Take 40 mg by mouth every evening.     Historical Provider, MD  rivaroxaban (XARELTO) 10 MG TABS tablet Take 1 tablet (10 mg total) by mouth daily with breakfast. Take Xarelto for two and  a half more weeks, then discontinue Xarelto. Once the patient has completed the Xarelto, they may resume the 81 mg Aspirin. 07/09/13   Alexzandrew Perkins, PA-C    traMADol (ULTRAM) 50 MG tablet Take 1-2 tablets (50-100 mg total) by mouth every 6 (six) hours as needed (mild pain). 07/09/13   Alexzandrew Julien Girt, PA-C    Diet: Cardiac diet and Diabetic diet Activity:FWB Follow-up:in 2 weeks Disposition - Home Discharged Condition: stable       Discharge Orders   Future Appointments Provider Department Dept Phone   09/26/2013 9:00 AM [facility] [phone]   10/03/2013 9:15 AM Amy Michelle Nasuti, MD [facility] [phone]   Future Orders Complete By Expires   Call MD / Call 911  As directed    Comments:     If you experience chest pain or shortness of breath, CALL 911 and be transported to the hospital emergency room.  If you develope a fever above 101 F, pus (white drainage) or increased drainage or redness at the wound, or calf pain, call your surgeon's office.   Change dressing  As directed    Comments:     Change dressing daily with sterile 4 x 4 inch gauze dressing and apply TED hose. Do not submerge the incision under water.   Constipation Prevention  As directed    Comments:     Drink plenty of fluids.  Prune juice may be helpful.  You may use a stool softener, such as Colace (over the counter) 100 mg twice a day.  Use MiraLax (over the counter) for constipation as needed.   Diet - low sodium heart healthy  As directed    Diet Carb Modified  As directed    Discharge instructions  As directed    Comments:     Pick up stool softner and laxative for home. Do not submerge incision under water. May shower. Continue to use ice for pain and swelling from surgery.  Take Xarelto for two and a half more weeks, then discontinue Xarelto. Once the patient has completed the Xarelto, they may resume the 81 mg Aspirin.   Do not put a pillow under the knee. Place it under the heel.  As directed    Do not sit on low chairs, stoools or toilet seats, as it may be difficult to get up from low  surfaces  As directed    Driving restrictions  As directed    Comments:     No driving until released by the physician.   Increase activity slowly as tolerated  As directed    Lifting restrictions  As directed    Comments:     No lifting until released by the physician.   Patient may shower  As directed    Comments:     You may shower without a dressing once there is no drainage.  Do not wash over the wound.  If drainage remains, do not shower until drainage stops.   TED hose  As directed    Comments:     Use stockings (TED hose) for 3 weeks on both leg(s).  You may remove them at night for sleeping.   Weight bearing as tolerated  As directed        Medication List    STOP taking these medications       aspirin EC 81 MG tablet     Calcium-Vitamin D 500-400 MG-UNIT Tabs     cholecalciferol  1000 UNITS tablet  Commonly known as:  VITAMIN D     multivitamin with minerals Tabs tablet     omega-3 acid ethyl esters 1 G capsule  Commonly known as:  LOVAZA      TAKE these medications       atorvastatin 80 MG tablet  Commonly known as:  LIPITOR  Take 80 mg by mouth every morning.     cetirizine 10 MG tablet  Commonly known as:  ZYRTEC  Take 10 mg by mouth daily.     clonazePAM 1 MG tablet  Commonly known as:  KLONOPIN  Take 2 mg by mouth at bedtime as needed for anxiety (sleep).     EPINEPHrine 0.3 mg/0.3 mL Soaj injection  Commonly known as:  EPI-PEN  Inject into the muscle once as needed.     gabapentin 300 MG capsule  Commonly known as:  NEURONTIN  Take 600 mg by mouth at bedtime.     hydrochlorothiazide 25 MG tablet  Commonly known as:  HYDRODIURIL  Take 25 mg by mouth every morning.     HYDROmorphone 2 MG tablet  Commonly known as:  DILAUDID  Take 1-2 tablets (2-4 mg total) by mouth every 4 (four) hours as needed.     metFORMIN 750 MG 24 hr tablet  Commonly known as:  GLUCOPHAGE-XR  Take 1 tablet (750 mg total) by mouth daily.     methocarbamol 500 MG  tablet  Commonly known as:  ROBAXIN  Take 1 tablet (500 mg total) by mouth every 6 (six) hours as needed.     metoprolol succinate 100 MG 24 hr tablet  Commonly known as:  TOPROL-XL  Take 100 mg by mouth at bedtime. Take with or immediately following a meal.     nitroGLYCERIN 0.4 MG SL tablet  Commonly known as:  NITROSTAT  Place 0.4 mg under the tongue every 5 (five) minutes as needed. For chest pain     pantoprazole 40 MG tablet  Commonly known as:  PROTONIX  Take 40 mg by mouth every evening.     PRESERVISION AREDS PO  Take 1 tablet by mouth 2 (two) times daily.     rivaroxaban 10 MG Tabs tablet  Commonly known as:  XARELTO  - Take 1 tablet (10 mg total) by mouth daily with breakfast. Take Xarelto for two and a half more weeks, then discontinue Xarelto.  - Once the patient has completed the Xarelto, they may resume the 81 mg Aspirin.     traMADol 50 MG tablet  Commonly known as:  ULTRAM  Take 1-2 tablets (50-100 mg total) by mouth every 6 (six) hours as needed (mild pain).     venlafaxine XR 37.5 MG 24 hr capsule  Commonly known as:  EFFEXOR-XR  Take 75 mg by mouth daily. pateint takes in am       Follow-up Information   Follow up with Loanne Drilling, MD. Schedule an appointment as soon as possible for a visit in 2 weeks.   Specialty:  Orthopedic Surgery   Contact information:   [address] [place] [address] [phone]       Signed: Patrica Duel 07/24/2013, 10:23 AM

## 2013-07-30 ENCOUNTER — Encounter: Payer: Self-pay | Admitting: Orthopedic Surgery

## 2013-08-13 ENCOUNTER — Encounter: Payer: Self-pay | Admitting: Orthopedic Surgery

## 2013-08-13 ENCOUNTER — Other Ambulatory Visit: Payer: Self-pay | Admitting: Family Medicine

## 2013-08-13 NOTE — Telephone Encounter (Signed)
Last OV 07/01/2013.  Ok to refill? 

## 2013-08-15 NOTE — Telephone Encounter (Signed)
Called into [facility]

## 2013-09-10 ENCOUNTER — Encounter: Payer: Self-pay | Admitting: Cardiology

## 2013-09-11 ENCOUNTER — Ambulatory Visit: Payer: Medicare Other | Admitting: Cardiology

## 2013-09-13 ENCOUNTER — Encounter: Payer: Self-pay | Admitting: Orthopedic Surgery

## 2013-09-18 ENCOUNTER — Other Ambulatory Visit: Payer: Self-pay

## 2013-09-23 ENCOUNTER — Encounter: Payer: Self-pay | Admitting: Cardiology

## 2013-09-23 ENCOUNTER — Ambulatory Visit (INDEPENDENT_AMBULATORY_CARE_PROVIDER_SITE_OTHER): Payer: Medicare Other | Admitting: Cardiology

## 2013-09-23 ENCOUNTER — Other Ambulatory Visit: Payer: Self-pay | Admitting: Family Medicine

## 2013-09-23 VITALS — BP 124/70 | HR 69 | Ht 63.75 in | Wt 177.8 lb

## 2013-09-23 DIAGNOSIS — I10 Essential (primary) hypertension: Secondary | ICD-10-CM

## 2013-09-23 DIAGNOSIS — E7800 Pure hypercholesterolemia, unspecified: Secondary | ICD-10-CM

## 2013-09-23 DIAGNOSIS — I2510 Atherosclerotic heart disease of native coronary artery without angina pectoris: Secondary | ICD-10-CM

## 2013-09-23 NOTE — Telephone Encounter (Signed)
Last office visit 07/01/2013.  Ok to refill? 

## 2013-09-23 NOTE — Progress Notes (Signed)
[address], [address] [place], [place]  [address] Phone: [phone] Fax:  [phone]  Date:  09/23/2013   ID:  Morgan [name], DOB 20-Apr-1941, MRN [id number]  PCP:  Kerby Nora, MD   History of Present Illness: Morgan [name] is a 72 y.o. female with coronary artery disease status post bypass surgery in 2006 here for followup.   Had episode of angioedema (testing for allergies has been done - negative except for cockroach). in April 2014, I stopped her losartan because of the small chance of cross reactivity/angioedema. Her blood pressure today was excellent. Cough has improved. ENT [facility]).   In review of her coronary disease, in April of 2012 she had cardiac catheterization with drug-eluting stent the first diagonal branch that had previously been determined to small for bypass in 2006. She completed cardiac rehabilitation. Approximately a year later she was having chest discomfort moderate in intensity but mostly leg pain/rheumatologic complaints. She underwent nuclear stress test on 02/05/12 which was low risk showing only possible subtle ischemia in the distal anterolateral segment. Normal ejection fraction.   Hyperlipidemia-last LDL 58 on 8/14. Excellent. Blood pressure also under good control.   Glucose mildly elevated. Hemoglobin A1C was 8.4.  Knee replacement.     Wt Readings from Last 3 Encounters:  09/23/13 177 lb 12.8 oz (80.65 kg)  07/07/13 187 lb (84.823 kg)  07/07/13 187 lb (84.823 kg)     Past Medical History  Diagnosis Date  . Hypertension   . High cholesterol   . Hyperlipidemia   . Angina   . Migraines     "til ~ 1980"  . Headache(784.0)     "recurring"  . Restless leg syndrome   . Weakness of right side of body     "I've had PT for it; they don't know what it's from".  CORTISONE INJECTION INTO BACK 08/30/12  . Diabetes mellitus     diet controlled  . Fibromyalgia   . Sciatic nerve pain     "from pinched nerve"  . Depression   .  Atrial fibrillation     ASPIRIN FOR BLOOD THINNER  . Coronary artery disease   . GERD (gastroesophageal reflux disease)   . H/O hiatal hernia   . Complication of anesthesia     pt states has choking sensation with ET tube   . PONV (postoperative nausea and vomiting)   . Sleep apnea     cpap setting at 3 per patient   . Arthritis   . Bulging discs     lumbar     Past Surgical History  Procedure Laterality Date  . Coronary artery bypass graft  2005    CABG X 2  . Coronary angioplasty with stent placement  06/2011    "1"  . Nasal septum surgery  ~ 1986  . 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 Dilation and curettage of uterus      "more than once"  . Knee arthroscopy  09/04/2012    Procedure: ARTHROSCOPY KNEE;  Surgeon: Loanne Drilling, MD;  Location: [facility];  Service: Orthopedics;  Laterality: Right;  right knee arthroscopy with medial and lateral meniscus debridement  . Fracture surgery  ~ 2005    nose  . Total knee arthroplasty Right 07/07/2013    Procedure: RIGHT TOTAL KNEE ARTHROPLASTY;  Surgeon: Loanne Drilling, MD;  Location: [facility];  Service: Orthopedics;  Laterality:  Right;    Current Outpatient Prescriptions  Medication Sig Dispense Refill  . aspirin 81 MG tablet Take 81 mg by mouth daily.      [name] atorvastatin (LIPITOR) 80 MG tablet Take 80 mg by mouth every morning.       . cetirizine (ZYRTEC) 10 MG tablet Take 10 mg by mouth daily.      . clonazePAM (KLONOPIN) 1 MG tablet TAKE 2 TABLETS BY MOUTH AT BEDTIME  60 tablet  0  . gabapentin (NEURONTIN) 300 MG capsule Take 600 mg by mouth at bedtime.      . hydrochlorothiazide (HYDRODIURIL) 25 MG tablet Take 25 mg by mouth every morning.      . metFORMIN (GLUCOPHAGE-XR) 750 MG 24 hr tablet Take 1 tablet (750 mg total) by mouth daily.  30 tablet  11  . methocarbamol (ROBAXIN) 500 MG tablet Take 1 tablet (500 mg total) by mouth every 6 (six) hours as needed.  80 tablet  0    . metoprolol succinate (TOPROL-XL) 100 MG 24 hr tablet Take 100 mg by mouth at bedtime. Take with or immediately following a meal.      . Multiple Vitamins-Minerals (PRESERVISION AREDS PO) Take 1 tablet by mouth 2 (two) times daily.       . nitroGLYCERIN (NITROSTAT) 0.4 MG SL tablet Place 0.4 mg under the tongue every 5 (five) minutes as needed. For chest pain      . traMADol (ULTRAM) 50 MG tablet Take 1-2 tablets (50-100 mg total) by mouth every 6 (six) hours as needed (mild pain).  60 tablet  0  . venlafaxine XR (EFFEXOR-XR) 37.5 MG 24 hr capsule Take 75 mg by mouth daily. pateint takes in am      . EPINEPHrine (EPI-PEN) 0.3 mg/0.3 mL SOAJ injection Inject into the muscle once as needed.       No current facility-administered medications for this visit.    Allergies:    Allergies  Allergen Reactions  . Percocet [Oxycodone-Acetaminophen] Other (See Comments)    hallucination  . Sulfa Antibiotics Other (See Comments)    hallucinations    Social History:  The patient  reports that she has never smoked. She has never used smokeless tobacco. She reports that she drinks about 1.2 ounces of alcohol per week. She reports that she does not use illicit drugs.   ROS:  Please see the history of present illness.   Denies any fevers, chills, orthopnea, PND, syncope    PHYSICAL EXAM: VS:  BP 124/70  Pulse 69  Ht 5' 3.75" (1.619 m)  Wt 177 lb 12.8 oz (80.65 kg)  BMI 30.77 kg/m2 Well nourished, well developed, in no acute distress HEENT: normal Neck: no JVD Cardiac:  normal S1, S2; RRR; no murmurPrior bypass scar well healed Lungs:  clear to auscultation bilaterally, no wheezing, rhonchi or rales Abd: soft, nontender, no hepatomegaly Ext: no edema. Knee right scar healing, effusion noted . Skin: warm and dry Neuro: no focal abnormalities noted  EKG:  None today   ASSESSMENT AND PLAN:  1. Coronary artery disease-status post bypass/DES placement. Overall doing well. 2. S/p knee  replacement 07/07/13 - Dr. Lequita Halt. Still taking pain medication for this. PT.  3. Hyperlipidemia-excellent lipids. Reviewed labs. Continue with atorvastatin. 4. Diabetes-metformin. Glucose reviewed. She is recently changed primary physicians. She is quite pleased. 5. Obesity-encourage weight loss once knee pain/physical therapy is improving. 6. We will see back in 6 months.  Signed, Donato Schultz, MD [facility]  09/23/2013 9:54 AM

## 2013-09-23 NOTE — Telephone Encounter (Signed)
Called to [facility] [address] 

## 2013-09-23 NOTE — Patient Instructions (Signed)
Your physician wants you to follow-up in: 6 months, with DR. Skains You will receive a reminder letter in the mail two months in advance. If you don't receive a letter, please call our office to schedule the follow-up appointment.  Your physician recommends that you continue on your current medications as directed. Please refer to the Current Medication list given to you today.

## 2013-09-25 ENCOUNTER — Encounter: Payer: Self-pay | Admitting: Radiology

## 2013-09-26 ENCOUNTER — Other Ambulatory Visit (INDEPENDENT_AMBULATORY_CARE_PROVIDER_SITE_OTHER): Payer: Medicare Other

## 2013-09-26 DIAGNOSIS — E119 Type 2 diabetes mellitus without complications: Secondary | ICD-10-CM

## 2013-09-26 LAB — COMPREHENSIVE METABOLIC PANEL
ALT: 23 U/L (ref 0–35)
AST: 20 U/L (ref 0–37)
Alkaline Phosphatase: 101 U/L (ref 39–117)
CO2: 31 mEq/L (ref 19–32)
Creatinine, Ser: 1 mg/dL (ref 0.4–1.2)
GFR: 60.08 mL/min (ref >60.00)
Sodium: 138 mEq/L (ref 135–145)
Total Bilirubin: 0.7 mg/dL (ref 0.3–1.2)
Total Protein: 7.2 g/dL (ref 6.0–8.3)

## 2013-10-03 ENCOUNTER — Ambulatory Visit (INDEPENDENT_AMBULATORY_CARE_PROVIDER_SITE_OTHER): Payer: Medicare Other | Admitting: Family Medicine

## 2013-10-03 ENCOUNTER — Encounter: Payer: Self-pay | Admitting: Family Medicine

## 2013-10-03 VITALS — BP 130/76 | HR 68 | Temp 97.4°F | Ht 63.0 in | Wt 178.8 lb

## 2013-10-03 DIAGNOSIS — E119 Type 2 diabetes mellitus without complications: Secondary | ICD-10-CM

## 2013-10-03 DIAGNOSIS — E7800 Pure hypercholesterolemia, unspecified: Secondary | ICD-10-CM

## 2013-10-03 DIAGNOSIS — I10 Essential (primary) hypertension: Secondary | ICD-10-CM

## 2013-10-03 DIAGNOSIS — Z23 Encounter for immunization: Secondary | ICD-10-CM

## 2013-10-03 NOTE — Assessment & Plan Note (Signed)
Improved control on higherdose and with diet and weight loss. Continue lifestyle change. Re-eval in 3 months may need to increase metformin further if not at goal A1C < 7.

## 2013-10-03 NOTE — Progress Notes (Signed)
72 year old female s/p TKR on right on 8/25 returns for follow up.  Doing PT, using a cane. Pain fairly well controlled.  Diabetes: Improved control on metformin  XR 750 mg daily.  She has been eating better. Husband is cooking, she has lost 9 lbs. Lab Results  Component Value Date   HGBA1C 7.5* 09/26/2013  Using medications without difficulties:None  Hypoglycemic episodes:None  Hyperglycemic episodes:None  Feet problems:None  Blood Sugars averaging: Not checking.  eye exam within last year: no.. due  Elevated Cholesterol:At goal <70 ( hx of CAD) on lipitor 80 mg daily  Lab Results  Component Value Date   CHOL 132 06/24/2013   HDL 48.00 06/24/2013   LDLCALC 61 06/24/2013   TRIG 115.0 06/24/2013   CHOLHDL 3 06/24/2013    Using medications without problems:None  Muscle aches: None  Diet compliance:Moderate.  Exercise:Minimal... She has been moving though so staying active.  Other complaints:   Hypertension: At goal on metoprolol and HCTZ.  BP Readings from Last 3 Encounters:  10/03/13 130/76  09/23/13 124/70  07/09/13 121/57    Using medication without problems or lightheadedness: None  Chest pain with exertion:None  Edema:None  Short of breath:None  Average home BPs: < 130/80  Other issues:  Wt Readings from Last 3 Encounters:  10/03/13 178 lb 12 oz (81.08 kg)  09/23/13 177 lb 12.8 oz (80.65 kg)  07/07/13 187 lb (84.823 kg)     Review of Systems  Constitutional: Negative for fever and fatigue.  HENT: Negative for ear pain.  Eyes: Negative for pain.  Respiratory: Negative for chest tightness and shortness of breath.  Cardiovascular: Negative for chest pain, palpitations and leg swelling.  Gastrointestinal: Negative for abdominal pain.  Genitourinary: Negative for dysuria.  Objective:   Physical Exam  Constitutional: Vital signs are normal. She appears well-developed and well-nourished. She is cooperative. Non-toxic appearance. She does not appear ill. No  distress.  HENT:  Head: Normocephalic.  Right Ear: Hearing, tympanic membrane, external ear and ear canal normal. Tympanic membrane is not erythematous, not retracted and not bulging.  Left Ear: Hearing, tympanic membrane, external ear and ear canal normal. Tympanic membrane is not erythematous, not retracted and not bulging.  Nose: No mucosal edema or rhinorrhea. Right sinus exhibits no maxillary sinus tenderness and no frontal sinus tenderness. Left sinus exhibits no maxillary sinus tenderness and no frontal sinus tenderness.  Mouth/Throat: Uvula is midline, oropharynx is clear and moist and mucous membranes are normal.  Eyes: Conjunctivae, EOM and lids are normal. Pupils are equal, round, and reactive to light. Lids are everted and swept, no foreign bodies found.  Neck: Trachea normal and normal range of motion. Neck supple. Carotid bruit is not present. No mass and no thyromegaly present.  Cardiovascular: Normal rate, regular rhythm, S1 normal, S2 normal, normal heart sounds, intact distal pulses and normal pulses. Exam reveals no gallop and no friction rub.  No murmur heard.  Pulmonary/Chest: Effort normal and breath sounds normal. Not tachypneic. No respiratory distress. She has no decreased breath sounds. She has no wheezes. She has no rhonchi. She has no rales.  Abdominal: Soft. Normal appearance and bowel sounds are normal. There is no tenderness.  Neurological: She is alert.  Skin: Skin is warm, dry and intact. No rash noted.  Psychiatric: Her speech is normal and behavior is normal. Judgment and thought content normal. Her mood appears not anxious. Cognition and memory are normal. She does not exhibit a depressed mood.   Diabetic  foot exam:  Normal inspection  No skin breakdown  No calluses  Normal DP pulses  Normal sensation to light touch and monofilament  Nails normal    Well healing right TKA scar.

## 2013-10-03 NOTE — Patient Instructions (Signed)
Continue metformin daily at current dose. Continue exercise  as tolerated and continue healthy eating low carb diet and  weight loss. Start checking blood sugar fasting in morning.. Goal <120 Also check 2 hour post prandial ( after meal)... Goal <180. Write down measurements. Follow up in 3 months with fasting labs prior.  Make sure to schedule yearly eye exam.

## 2013-10-03 NOTE — Progress Notes (Signed)
Pre-visit discussion using our clinic review tool. No additional management support is needed unless otherwise documented below in the visit note.  

## 2013-10-03 NOTE — Assessment & Plan Note (Signed)
Well controlled. Continue current medication.  

## 2013-10-03 NOTE — Assessment & Plan Note (Signed)
Re-eval next OV.

## 2013-10-08 ENCOUNTER — Encounter: Payer: Self-pay | Admitting: Cardiology

## 2013-10-08 ENCOUNTER — Other Ambulatory Visit: Payer: Self-pay | Admitting: General Surgery

## 2013-10-08 ENCOUNTER — Ambulatory Visit (INDEPENDENT_AMBULATORY_CARE_PROVIDER_SITE_OTHER): Payer: Medicare Other | Admitting: Cardiology

## 2013-10-08 VITALS — BP 137/65 | HR 71 | Ht 63.0 in | Wt 178.8 lb

## 2013-10-08 DIAGNOSIS — I10 Essential (primary) hypertension: Secondary | ICD-10-CM

## 2013-10-08 DIAGNOSIS — G4733 Obstructive sleep apnea (adult) (pediatric): Secondary | ICD-10-CM

## 2013-10-08 DIAGNOSIS — E669 Obesity, unspecified: Secondary | ICD-10-CM | POA: Insufficient documentation

## 2013-10-08 NOTE — Patient Instructions (Signed)
Your physician recommends that you continue on your current medications as directed. Please refer to the Current Medication list given to you today.  I called [facility] and they should be contacting you for a CPAP Download within the next week.  Your physician wants you to follow-up in: 6 Months with Dr Sherlyn Lick will receive a reminder letter in the mail two months in advance. If you don't receive a letter, please call our office to schedule the follow-up appointment.

## 2013-10-08 NOTE — Progress Notes (Signed)
[address] [place], [place]  [address] Phone: [phone] Fax:  [phone]  Date:  10/08/2013   ID:  Akiera Allbaugh, DOB [birth year], MRN [id number]  PCP:  Kerby Nora, MD  Sleep Medicine:  Armanda Magic, MD   History of Present Illness: Morgan [name] is a 72 y.o. female with a history of OSA, HTN and obesity who presents today for followup.  She uses a full face mask which she tolerates.  She feels the pressure is adequate.  She has no daytime sleepiness and feels refreshed when she gets up in the am.  She is currently not exercising due to recent knee surgery.   Wt Readings from Last 3 Encounters:  10/08/13 178 lb 12.8 oz (81.103 kg)  10/03/13 178 lb 12 oz (81.08 kg)  09/23/13 177 lb 12.8 oz (80.65 kg)     Past Medical History  Diagnosis Date  . Hypertension   . High cholesterol   . Hyperlipidemia   . Angina   . Migraines     "til ~ 1980"  . Headache(784.0)     "recurring"  . Restless leg syndrome   . Weakness of right side of body     "I've had PT for it; they don't know what it's from".  CORTISONE INJECTION INTO BACK 08/30/12  . Diabetes mellitus     diet controlled  . Fibromyalgia   . Sciatic nerve pain     "from pinched nerve"  . Depression   . Atrial fibrillation     ASPIRIN FOR BLOOD THINNER  . Coronary artery disease   . GERD (gastroesophageal reflux disease)   . H/O hiatal hernia   . Complication of anesthesia     pt states has choking sensation with ET tube   . PONV (postoperative nausea and vomiting)   . Sleep apnea   . Arthritis   . Bulging discs     lumbar     Current Outpatient Prescriptions  Medication Sig Dispense Refill  . aspirin 81 MG tablet Take 81 mg by mouth daily.      [name] atorvastatin (LIPITOR) 80 MG tablet Take 80 mg by mouth every morning.       . cetirizine (ZYRTEC) 10 MG tablet Take 10 mg by mouth daily.      . clonazePAM (KLONOPIN) 1 MG tablet TAKE 2 TABLETS AT BEDTIME  60 tablet  0  . EPINEPHrine (EPI-PEN) 0.3  mg/0.3 mL SOAJ injection Inject into the muscle once as needed.      [name] FLUZONE HIGH-DOSE injection       . gabapentin (NEURONTIN) 300 MG capsule Take 600 mg by mouth at bedtime.      . hydrochlorothiazide (HYDRODIURIL) 25 MG tablet Take 25 mg by mouth every morning.      . metFORMIN (GLUCOPHAGE-XR) 750 MG 24 hr tablet Take 1 tablet (750 mg total) by mouth daily.  30 tablet  11  . methocarbamol (ROBAXIN) 500 MG tablet Take 1 tablet (500 mg total) by mouth every 6 (six) hours as needed.  80 tablet  0  . metoprolol succinate (TOPROL-XL) 100 MG 24 hr tablet Take 100 mg by mouth at bedtime. Take with or immediately following a meal.      . Multiple Vitamins-Minerals (PRESERVISION AREDS PO) Take 1 tablet by mouth 2 (two) times daily.       . nitroGLYCERIN (NITROSTAT) 0.4 MG SL tablet Place 0.4 mg under the tongue every 5 (five) minutes as needed. For chest pain      .  pantoprazole (PROTONIX) 20 MG tablet Take 40 mg by mouth daily.      . traMADol (ULTRAM) 50 MG tablet Take 1-2 tablets (50-100 mg total) by mouth every 6 (six) hours as needed (mild pain).  60 tablet  0  . venlafaxine XR (EFFEXOR-XR) 37.5 MG 24 hr capsule Take 75 mg by mouth daily. pateint takes in am       No current facility-administered medications for this visit.    Allergies:    Allergies  Allergen Reactions  . Percocet [Oxycodone-Acetaminophen] Other (See Comments)    hallucination  . Sulfa Antibiotics Other (See Comments)    hallucinations    Social History:  The patient  reports that she has never smoked. She has never used smokeless tobacco. She reports that she drinks about 1.2 ounces of alcohol per week. She reports that she does not use illicit drugs.   Family History:  The patient's family history includes Cancer (age of onset: 93) in her mother and sister; Dementia in her sister; Diabetes in her sister; GER disease in her daughter; Heart disease (age of onset: 48) in her father; Hypertension in her daughter and  sister.   ROS:  Please see the history of present illness.      All other systems reviewed and negative.   PHYSICAL EXAM: VS:  BP 137/65  Pulse 71  Ht 5\' 3"  (1.6 m)  Wt 178 lb 12.8 oz (81.103 kg)  BMI 31.68 kg/m2 Well nourished, well developed, in no acute distress HEENT: normal Neck: no JVD Cardiac:  normal S1, S2; RRR; no murmur Lungs:  clear to auscultation bilaterally, no wheezing, rhonchi or rales Abd: soft, nontender, no hepatomegaly Ext: no edema Skin: warm and dry Neuro:  CNs 2-12 intact, no focal abnormalities noted       ASSESSMENT AND PLAN:  1. OSA on CPAP  - I will get a download from her DME 2. HTN - well controlled  - continue HCTZ/metoprolol 3. Obesity - currently in rehab on her knee and I encouraged her to restart an exercise program when cleared by her orthopedist to do so  Followup with me in 6 months    Signed, Armanda Magic, MD 10/08/2013 11:35 AM

## 2013-10-13 ENCOUNTER — Encounter: Payer: Self-pay | Admitting: Orthopedic Surgery

## 2013-10-20 ENCOUNTER — Encounter: Payer: Self-pay | Admitting: Family Medicine

## 2013-10-22 ENCOUNTER — Other Ambulatory Visit: Payer: Self-pay | Admitting: Cardiology

## 2013-10-30 ENCOUNTER — Other Ambulatory Visit: Payer: Self-pay | Admitting: Family Medicine

## 2013-10-30 NOTE — Telephone Encounter (Signed)
Called to [facility] [address] 

## 2013-10-30 NOTE — Telephone Encounter (Signed)
Last office visit 10/03/2013.  Ok to refill? 

## 2013-11-26 ENCOUNTER — Encounter: Payer: Self-pay | Admitting: Family Medicine

## 2013-11-26 ENCOUNTER — Ambulatory Visit (INDEPENDENT_AMBULATORY_CARE_PROVIDER_SITE_OTHER): Payer: Medicare Other | Admitting: Family Medicine

## 2013-11-26 VITALS — BP 140/62 | HR 60 | Temp 97.5°F | Ht 63.0 in | Wt 179.5 lb

## 2013-11-26 DIAGNOSIS — N390 Urinary tract infection, site not specified: Secondary | ICD-10-CM

## 2013-11-26 DIAGNOSIS — R350 Frequency of micturition: Secondary | ICD-10-CM

## 2013-11-26 LAB — POCT URINALYSIS DIPSTICK
BILIRUBIN UA: NEGATIVE
Glucose, UA: NEGATIVE
Ketones, UA: NEGATIVE
NITRITE UA: NEGATIVE
PH UA: 6
Protein, UA: NEGATIVE
RBC UA: NEGATIVE
Spec Grav, UA: 1.015
UROBILINOGEN UA: 1

## 2013-11-26 MED ORDER — CIPROFLOXACIN HCL 250 MG PO TABS: 250.0000 mg | ORAL_TABLET | Freq: Two times a day (BID) | ORAL | Status: DC

## 2013-11-26 NOTE — Progress Notes (Signed)
Date:  11/26/2013   Name:  Morgan [name]   DOB:  01/23/1941   MRN:  [id number] Gender: female Age: 73 y.o.  PCP:  Eliezer Lofts, MD   History of Present Illness:  Patient presents with burning, urgency. No vaginal discharge or external irritation.  No STD exposure. No abd pain, no flank pain.  Patient Active Problem List   Diagnosis Date Noted  . Obesity (BMI 30-39.9) 10/08/2013  . Postoperative anemia due to acute blood loss 07/09/2013  . OA (osteoarthritis) of knee 07/07/2013  . Chronic low back pain 05/09/2013  . Obstructive sleep apnea 05/09/2013  . GERD (gastroesophageal reflux disease) 05/09/2013  . Hiatal hernia 05/09/2013  . Fibromyalgia syndrome 05/09/2013  . Depression 05/09/2013  . RLS (restless legs syndrome) 05/09/2013  . Type II or unspecified type diabetes mellitus without mention of complication, not stated as uncontrolled 05/09/2013  . High cholesterol 05/09/2013  . 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 Family history of colon cancer 05/09/2013  . Allergic rhinitis 05/09/2013  . Lateral meniscal tear 09/04/2012  . CAD (coronary artery disease) 09/27/2011  . HTN (hypertension) 09/27/2011  . Paroxysmal a-fib 09/27/2011    Past Medical History  Diagnosis Date  . Hypertension   . High cholesterol   . Hyperlipidemia   . Angina   . Migraines     "til ~ 1980"  . Headache(784.0)     "recurring"  . Restless leg syndrome   . Weakness of right side of body     "I've had PT for it; they don't know what it's from".  CORTISONE INJECTION INTO BACK 08/30/12  . Diabetes mellitus     diet controlled  . Fibromyalgia   . Sciatic nerve pain     "from pinched nerve"  . Depression   . Atrial fibrillation     ASPIRIN FOR BLOOD THINNER  . Coronary artery disease   . GERD (gastroesophageal reflux disease)   . H/O hiatal hernia   . Complication of anesthesia     pt states has choking sensation with ET tube   . PONV (postoperative nausea and vomiting)   . Sleep  apnea   . Arthritis   . Bulging discs     lumbar     Past Surgical History  Procedure Laterality Date  . Coronary artery bypass graft  2005    CABG X 2  . Coronary angioplasty with stent placement  06/2011    "1"  . Nasal septum surgery  ~ 1986  . 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 Dilation and curettage of uterus      "more than once"  . Knee arthroscopy  09/04/2012    Procedure: ARTHROSCOPY KNEE;  Surgeon: Gearlean Alf, MD;  Location: [facility];  Service: Orthopedics;  Laterality: Right;  right knee arthroscopy with medial and lateral meniscus debridement  . Fracture surgery  ~ 2005    nose  . Total knee arthroplasty Right 07/07/2013    Procedure: RIGHT TOTAL KNEE ARTHROPLASTY;  Surgeon: Gearlean Alf, MD;  Location: [facility];  Service: Orthopedics;  Laterality: Right;    History   Social History  . Marital Status: Married    Spouse Name: N/A    Number of Children: N/A  . Years of Education: N/A   Occupational History  . Not on file.   Social History Main Topics  . Smoking status: Never Smoker   . Smokeless tobacco:  Never Used  . Alcohol Use: 1.2 oz/week    2 Glasses of wine per week     Comment: "occasionally drink wine"  . Drug Use: No  . Sexual Activity: Yes   Other Topics Concern  . Not on file   Social History Narrative  . No narrative on file    Family History  Problem Relation Age of Onset  . Cancer Mother 65    breast cancer  . Heart disease Father 32    sudden onset due to CAD  . Cancer Sister 49    colon  . Hypertension Sister   . Dementia Sister   . Diabetes Sister   . GER disease Daughter   . Hypertension Daughter     Allergies  Allergen Reactions  . Percocet [Oxycodone-Acetaminophen] Other (See Comments)    hallucination  . Sulfa Antibiotics Other (See Comments)    hallucinations    Medication list reviewed and updated in full in [place].  ROS: GEN:  no fevers,  chills. GI: No n/v/d, eating normally Otherwise, ROS is as per the HPI.  PHYSICAL EXAM  Filed Vitals:   11/26/13 1129  BP: 140/62  Pulse: 60  Temp: 97.5 F (36.4 C)  TempSrc: Oral  Height: 5\' 3"  (1.6 m)  Weight: 179 lb 8 oz (81.421 kg)    GEN: WDWN, A&Ox4,NAD. Non-toxic HEENT: Atraumatc, normocephalic. CV: RRR, No M/G/R PULM: CTA B, No wheezes, crackles, or rhonchi ABD: S, NT, ND, +BS, no rebound. No CVAT. No suprapubic tenderness. EXT: No c/c/e  Objective Data: Results for orders placed in visit on 11/26/13  POCT URINALYSIS DIPSTICK      Result Value Range   Color, UA YELLOW     Clarity, UA CLEAR     Glucose, UA NEGATIVE     Bilirubin, UA NEGATIVE     Ketones, UA NEGATIVE     Spec Grav, UA 1.015     Blood, UA NEGATIVE     pH, UA 6.0     Protein, UA NEGATIVE     Urobilinogen, UA 1.0     Nitrite, UA NEGATIVE     Leukocytes, UA moderate (2+)      A/P: UTI. Rx with ABX as below  Orders Placed This Encounter  Procedures  . Urine culture  . POCT Urinalysis Dipstick    Meds ordered this encounter  Medications  . ciprofloxacin (CIPRO) 250 MG tablet    Sig: Take 1 tablet (250 mg total) by mouth 2 (two) times daily.    Dispense:  14 tablet    Refill:  0     Signed,  Hancel Ion T. Korena Nass, MD, [place] at [facility] [place] [address] Phone: [phone] Fax: [phone]

## 2013-11-26 NOTE — Progress Notes (Signed)
Pre-visit discussion using our clinic review tool. No additional management support is needed unless otherwise documented below in the visit note.  

## 2013-11-27 LAB — URINE CULTURE
Colony Count: NO GROWTH
Organism ID, Bacteria: NO GROWTH

## 2013-12-09 ENCOUNTER — Ambulatory Visit: Payer: Self-pay | Admitting: Orthopedic Surgery

## 2013-12-11 ENCOUNTER — Other Ambulatory Visit: Payer: Self-pay | Admitting: Family Medicine

## 2013-12-11 NOTE — Telephone Encounter (Signed)
Called to [facility].

## 2013-12-11 NOTE — Telephone Encounter (Signed)
Last office visit 11/26/2013 with Dr. Lorelei Pont.  Ok to refill?

## 2013-12-22 ENCOUNTER — Other Ambulatory Visit: Payer: Self-pay | Admitting: Family Medicine

## 2013-12-23 ENCOUNTER — Encounter: Payer: Self-pay | Admitting: Physician Assistant

## 2013-12-30 ENCOUNTER — Other Ambulatory Visit: Payer: Medicare Other

## 2013-12-31 ENCOUNTER — Telehealth: Payer: Self-pay | Admitting: Family Medicine

## 2013-12-31 DIAGNOSIS — I10 Essential (primary) hypertension: Secondary | ICD-10-CM

## 2013-12-31 DIAGNOSIS — E119 Type 2 diabetes mellitus without complications: Secondary | ICD-10-CM

## 2013-12-31 DIAGNOSIS — E7800 Pure hypercholesterolemia, unspecified: Secondary | ICD-10-CM

## 2013-12-31 NOTE — Telephone Encounter (Signed)
Message copied by Jinny Sanders on Wed Dec 31, 2013  9:34 AM ------      Message from: Ellamae Sia      Created: Thu Dec 25, 2013 11:57 AM      Regarding: lab orders for Tuesday, 2.17.15       Lab orders for a f/u appt ------

## 2014-01-02 ENCOUNTER — Other Ambulatory Visit (INDEPENDENT_AMBULATORY_CARE_PROVIDER_SITE_OTHER): Payer: Medicare Other

## 2014-01-02 DIAGNOSIS — I10 Essential (primary) hypertension: Secondary | ICD-10-CM

## 2014-01-02 DIAGNOSIS — E7800 Pure hypercholesterolemia, unspecified: Secondary | ICD-10-CM

## 2014-01-02 DIAGNOSIS — E119 Type 2 diabetes mellitus without complications: Secondary | ICD-10-CM

## 2014-01-02 LAB — COMPREHENSIVE METABOLIC PANEL
ALBUMIN: 3.9 g/dL (ref 3.5–5.2)
ALT: 29 U/L (ref 0–35)
AST: 26 U/L (ref 0–37)
Alkaline Phosphatase: 103 U/L (ref 39–117)
BUN: 19 mg/dL (ref 6–23)
CALCIUM: 9.7 mg/dL (ref 8.4–10.5)
CO2: 33 meq/L — AB (ref 19–32)
CREATININE: 0.9 mg/dL (ref 0.4–1.2)
Chloride: 100 mEq/L (ref 96–112)
GFR: 64.63 mL/min (ref >60.00)
Glucose, Bld: 148 mg/dL — ABNORMAL HIGH (ref 70–99)
POTASSIUM: 4.1 meq/L (ref 3.5–5.1)
Sodium: 141 mEq/L (ref 135–145)
Total Bilirubin: 0.9 mg/dL (ref 0.3–1.2)
Total Protein: 7 g/dL (ref 6.0–8.3)

## 2014-01-02 LAB — LIPID PANEL
CHOLESTEROL: 132 mg/dL (ref 0–200)
HDL: 53.3 mg/dL (ref >39.00)
LDL CALC: 62 mg/dL (ref 0–99)
TRIGLYCERIDES: 85 mg/dL (ref 0.0–149.0)
Total CHOL/HDL Ratio: 2
VLDL: 17 mg/dL (ref 0.0–40.0)

## 2014-01-02 LAB — HEMOGLOBIN A1C: Hgb A1c MFr Bld: 7.4 % — ABNORMAL HIGH (ref 4.6–6.5)

## 2014-01-02 LAB — MICROALBUMIN / CREATININE URINE RATIO
CREATININE, U: 96.9 mg/dL
MICROALB UR: 0.3 mg/dL (ref 0.0–1.9)
Microalb Creat Ratio: 0.3 mg/g (ref 0.0–30.0)

## 2014-01-06 ENCOUNTER — Ambulatory Visit: Payer: Medicare Other | Admitting: Family Medicine

## 2014-01-07 ENCOUNTER — Encounter: Payer: Self-pay | Admitting: Cardiology

## 2014-01-08 ENCOUNTER — Ambulatory Visit: Payer: Medicare Other | Admitting: Family Medicine

## 2014-01-09 ENCOUNTER — Ambulatory Visit: Payer: Medicare Other | Admitting: Family Medicine

## 2014-01-11 ENCOUNTER — Encounter: Payer: Self-pay | Admitting: Physician Assistant

## 2014-01-15 ENCOUNTER — Ambulatory Visit (INDEPENDENT_AMBULATORY_CARE_PROVIDER_SITE_OTHER): Payer: Medicare Other | Admitting: Family Medicine

## 2014-01-15 ENCOUNTER — Encounter: Payer: Self-pay | Admitting: Family Medicine

## 2014-01-15 VITALS — BP 120/70 | HR 58 | Temp 98.5°F | Wt 178.0 lb

## 2014-01-15 DIAGNOSIS — E7800 Pure hypercholesterolemia, unspecified: Secondary | ICD-10-CM

## 2014-01-15 DIAGNOSIS — L600 Ingrowing nail: Secondary | ICD-10-CM | POA: Insufficient documentation

## 2014-01-15 DIAGNOSIS — I10 Essential (primary) hypertension: Secondary | ICD-10-CM

## 2014-01-15 DIAGNOSIS — E119 Type 2 diabetes mellitus without complications: Secondary | ICD-10-CM

## 2014-01-15 MED ORDER — METFORMIN HCL ER (MOD) 1000 MG PO TB24: 1000.0000 mg | ORAL_TABLET | Freq: Every day | ORAL | Status: DC

## 2014-01-15 MED ORDER — ONETOUCH DELICA LANCETS 33G MISC: Status: DC

## 2014-01-15 MED ORDER — CEPHALEXIN 500 MG PO CAPS: 500.0000 mg | ORAL_CAPSULE | Freq: Three times a day (TID) | ORAL | Status: DC

## 2014-01-15 NOTE — Assessment & Plan Note (Signed)
Inadequate control. Encouraged exercise, weight loss, healthy eating habits.  Increase metformin to max.

## 2014-01-15 NOTE — Assessment & Plan Note (Signed)
Well controlled. Continue current medication.  

## 2014-01-15 NOTE — Patient Instructions (Addendum)
Increase metformin to 1000 mg daily. Follow blood sugars, do not skip meals Continue working on low carb diet, exercise and weight loss.  Warm water soaks B  Great toes, cotton and Qtip to angle nail up as discussed. Start and complete antibiotics for infected ingrown toenail. Call if pain in toenails not improving. Follow up with labs prior in 3 month.

## 2014-01-15 NOTE — Progress Notes (Signed)
Pre visit review using our clinic review tool, if applicable. No additional management support is needed unless otherwise documented below in the visit note. 

## 2014-01-15 NOTE — Assessment & Plan Note (Signed)
Infected. Treat with antibiotics and soaks. As well at attempt to direct nail growth upward with cotton and Qtip.

## 2014-01-15 NOTE — Progress Notes (Signed)
73 year old female s/p TKR on right on 8/25 returns for 3 month follow up of DM  Doing PT,no longer using cane. Pain fairly well controlled.   Diabetes: Improved control on metformin XR 750 mg daily. She has been eating better. Lab Results  Component Value Date   HGBA1C 7.4* 01/02/2014   Wt Readings from Last 3 Encounters:  01/15/14 178 lb (80.74 kg)  11/26/13 179 lb 8 oz (81.421 kg)  10/08/13 178 lb 12.8 oz (81.103 kg)  Using medications without difficulties:None  Hypoglycemic episodes:None  Hyperglycemic episodes:None  Feet problems:yes.. See below Blood Sugars averaging: 130-190 fasting  eye exam within last year: no.. due   Elevated Cholesterol:At goal <70 ( hx of CAD) on lipitor 80 mg daily Using medications without problems:None  Muscle aches: None  Diet compliance:Moderate.  Exercise:Minimal... She has been moving though so staying active.  Continuing to stay active. Other complaints:  Lab Results  Component Value Date   CHOL 132 01/02/2014   HDL 53.30 01/02/2014   LDLCALC 62 01/02/2014   TRIG 85.0 01/02/2014   CHOLHDL 2 01/02/2014    Hypertension: At goal on metoprolol and HCTZ.  BP Readings from Last 3 Encounters:  01/15/14 120/70  11/26/13 140/62  10/08/13 137/65  Using medication without problems or lightheadedness: None  Chest pain with exertion:None  Edema:None  Short of breath:None  Average home BPs: < 130/80  Other issues:   B ingrown toenails over past month, now right tender and red. Dried discharge at nail edge.  Review of Systems  Constitutional: Negative for fever and fatigue.  HENT: Negative for ear pain.  Eyes: Negative for pain.  Respiratory: Negative for chest tightness and shortness of breath.  Cardiovascular: Negative for chest pain, palpitations and leg swelling.  Gastrointestinal: Negative for abdominal pain.  Genitourinary: Negative for dysuria.  Objective:   Physical Exam  Constitutional: Vital signs are normal. She appears  well-developed and well-nourished. She is cooperative. Non-toxic appearance. She does not appear ill. No distress.  HENT:  Head: Normocephalic.  Right Ear: Hearing, tympanic membrane, external ear and ear canal normal. Tympanic membrane is not erythematous, not retracted and not bulging.  Left Ear: Hearing, tympanic membrane, external ear and ear canal normal. Tympanic membrane is not erythematous, not retracted and not bulging.  Nose: No mucosal edema or rhinorrhea. Right sinus exhibits no maxillary sinus tenderness and no frontal sinus tenderness. Left sinus exhibits no maxillary sinus tenderness and no frontal sinus tenderness.  Mouth/Throat: Uvula is midline, oropharynx is clear and moist and mucous membranes are normal.  Eyes: Conjunctivae, EOM and lids are normal. Pupils are equal, round, and reactive to light. Lids are everted and swept, no foreign bodies found.  Neck: Trachea normal and normal range of motion. Neck supple. Carotid bruit is not present. No mass and no thyromegaly present.  Cardiovascular: Normal rate, regular rhythm, S1 normal, S2 normal, normal heart sounds, intact distal pulses and normal pulses. Exam reveals no gallop and no friction rub.  No murmur heard.  Pulmonary/Chest: Effort normal and breath sounds normal. Not tachypneic. No respiratory distress. She has no decreased breath sounds. She has no wheezes. She has no rhonchi. She has no rales.  Abdominal: Soft. Normal appearance and bowel sounds are normal. There is no tenderness.  Neurological: She is alert.  Skin: Skin is warm, dry and intact. No rash noted.  Psychiatric: Her speech is normal and behavior is normal. Judgment and thought content normal. Her mood appears not anxious. Cognition and  memory are normal. She does not exhibit a depressed mood.   Diabetic foot exam:  Normal inspection  No skin breakdown  No calluses  Normal DP pulses  Normal sensation to light touch and monofilament  Nails: B great toe  with erythema and discharge at lateral edge, right toenail edge tender.   Well healing right TKA scar.

## 2014-01-16 ENCOUNTER — Telehealth: Payer: Self-pay

## 2014-01-16 ENCOUNTER — Telehealth: Payer: Self-pay | Admitting: Family Medicine

## 2014-01-16 NOTE — Telephone Encounter (Signed)
Relevant patient education assigned to patient using Emmi. ° °

## 2014-01-25 ENCOUNTER — Other Ambulatory Visit: Payer: Self-pay | Admitting: Family Medicine

## 2014-01-25 NOTE — Telephone Encounter (Signed)
Last office visit 01/15/2014.  Ok to refill?

## 2014-01-26 NOTE — Telephone Encounter (Signed)
Clonazepam called to [facility] [address]

## 2014-02-06 ENCOUNTER — Telehealth: Payer: Self-pay | Admitting: Cardiology

## 2014-02-06 NOTE — Telephone Encounter (Signed)
New Message:  Pt is asking if she can have her labs that Dr Marlou Porch wants done at [place].. At her [facility] office. Pt states it is more convenient and closer for her. Pt would like a call back.

## 2014-02-11 ENCOUNTER — Other Ambulatory Visit: Payer: Medicare Other

## 2014-02-11 ENCOUNTER — Encounter: Payer: Self-pay | Admitting: Physician Assistant

## 2014-02-12 ENCOUNTER — Other Ambulatory Visit: Payer: Self-pay | Admitting: *Deleted

## 2014-02-12 DIAGNOSIS — E7800 Pure hypercholesterolemia, unspecified: Secondary | ICD-10-CM

## 2014-02-12 DIAGNOSIS — Z79899 Other long term (current) drug therapy: Secondary | ICD-10-CM

## 2014-02-13 NOTE — Telephone Encounter (Signed)
Left message for patient to call the office

## 2014-02-16 NOTE — Telephone Encounter (Signed)
Follow up ° ° ° ° ° °Returning the nurses call ° ° ° ° ° ° °

## 2014-02-27 ENCOUNTER — Other Ambulatory Visit: Payer: Self-pay | Admitting: Family Medicine

## 2014-02-27 NOTE — Telephone Encounter (Signed)
Called to [facility] [address]

## 2014-02-27 NOTE — Telephone Encounter (Signed)
Last office visit 01/15/2014.  Last refilled 01/25/2014 for #60.  Ok to refill?

## 2014-03-10 NOTE — Telephone Encounter (Signed)
Labs completed and in Epic.

## 2014-03-17 ENCOUNTER — Other Ambulatory Visit (INDEPENDENT_AMBULATORY_CARE_PROVIDER_SITE_OTHER): Payer: Medicare Other

## 2014-03-17 DIAGNOSIS — Z79899 Other long term (current) drug therapy: Secondary | ICD-10-CM

## 2014-03-17 DIAGNOSIS — E7800 Pure hypercholesterolemia, unspecified: Secondary | ICD-10-CM

## 2014-03-17 LAB — HEPATIC FUNCTION PANEL
ALT: 32 U/L (ref 0–35)
AST: 26 U/L (ref 0–37)
Albumin: 3.5 g/dL (ref 3.5–5.2)
Alkaline Phosphatase: 96 U/L (ref 39–117)
BILIRUBIN DIRECT: 0.1 mg/dL (ref 0.0–0.3)
Total Bilirubin: 0.6 mg/dL (ref 0.2–1.2)
Total Protein: 6.5 g/dL (ref 6.0–8.3)

## 2014-03-17 LAB — LIPID PANEL
Cholesterol: 137 mg/dL (ref 0–200)
HDL: 54.2 mg/dL (ref >39.00)
LDL Cholesterol: 67 mg/dL (ref 0–99)
Total CHOL/HDL Ratio: 3
Triglycerides: 77 mg/dL (ref 0.0–149.0)
VLDL: 15.4 mg/dL (ref 0.0–40.0)

## 2014-03-22 LAB — HM DIABETES EYE EXAM

## 2014-03-23 ENCOUNTER — Ambulatory Visit (INDEPENDENT_AMBULATORY_CARE_PROVIDER_SITE_OTHER): Payer: 59 | Admitting: Cardiology

## 2014-03-23 ENCOUNTER — Encounter: Payer: Self-pay | Admitting: Cardiology

## 2014-03-23 VITALS — BP 145/56 | HR 63 | Ht 63.0 in | Wt 181.0 lb

## 2014-03-23 DIAGNOSIS — I10 Essential (primary) hypertension: Secondary | ICD-10-CM

## 2014-03-23 DIAGNOSIS — I2510 Atherosclerotic heart disease of native coronary artery without angina pectoris: Secondary | ICD-10-CM

## 2014-03-23 DIAGNOSIS — E7800 Pure hypercholesterolemia, unspecified: Secondary | ICD-10-CM

## 2014-03-23 DIAGNOSIS — E669 Obesity, unspecified: Secondary | ICD-10-CM

## 2014-03-23 NOTE — Progress Notes (Signed)
[place]. [address], [address] [place], [place]  [address] Phone: [phone] Fax:  [phone]  Date:  03/23/2014   ID:  Morgan [name], DOB 1941/07/02, MRN [id number]  PCP:  Eliezer Lofts, MD   History of Present Illness: Morgan [name] is a 73 y.o. female with coronary artery disease status post bypass surgery in 2006, diagonal stent, DM here for followup.   Had episode of angioedema (testing for allergies has been done - negative except for cockroach). in April 2014, I stopped her losartan because of the small chance of cross reactivity/angioedema. Her blood pressure today was excellent. Cough has improved. ENT [facility]).   In review of her coronary disease, in April of 2012 she had cardiac catheterization with drug-eluting stent the first diagonal branch that had previously been determined to small for bypass in 2006. She completed cardiac rehabilitation. Approximately a year later she was having chest discomfort moderate in intensity but mostly leg pain/rheumatologic complaints. She underwent nuclear stress test on 02/05/12 which was low risk showing only possible subtle ischemia in the distal anterolateral segment. Normal ejection fraction.   Hyperlipidemia-last LDL 58 on 8/14. Excellent. Blood pressure also under good control.   Glucose mildly elevated. Hemoglobin A1C was 8.4.  Knee replacement.   Feeling mild SOB, HA. Reminds her slightly of the way she felt prior to her diagonal, small caliber PCI.  Post knee.    Wt Readings from Last 3 Encounters:  03/23/14 181 lb (82.101 kg)  01/15/14 178 lb (80.74 kg)  11/26/13 179 lb 8 oz (81.421 kg)     Past Medical History  Diagnosis Date  . Hypertension   . High cholesterol   . Hyperlipidemia   . Angina   . Migraines     "til ~ 1980"  . Headache(784.0)     "recurring"  . Restless leg syndrome   . Weakness of right side of body     "I've had PT for it; they don't know what it's from".  CORTISONE INJECTION INTO BACK  08/30/12  . Diabetes mellitus     diet controlled  . Fibromyalgia   . Sciatic nerve pain     "from pinched nerve"  . Depression   . Atrial fibrillation     ASPIRIN FOR BLOOD THINNER  . Coronary artery disease   . GERD (gastroesophageal reflux disease)   . H/O hiatal hernia   . Complication of anesthesia     pt states has choking sensation with ET tube   . PONV (postoperative nausea and vomiting)   . Sleep apnea   . Arthritis   . Bulging discs     lumbar     Past Surgical History  Procedure Laterality Date  . Coronary artery bypass graft  2005    CABG X 2  . Coronary angioplasty with stent placement  06/2011    "1"  . Nasal septum surgery  ~ 1986  . 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 Dilation and curettage of uterus      "more than once"  . Knee arthroscopy  09/04/2012    Procedure: ARTHROSCOPY KNEE;  Surgeon: Gearlean Alf, MD;  Location: [facility];  Service: Orthopedics;  Laterality: Right;  right knee arthroscopy with medial and lateral meniscus debridement  . Fracture surgery  ~ 2005    nose  . Total knee arthroplasty Right 07/07/2013    Procedure: RIGHT TOTAL KNEE  ARTHROPLASTY;  Surgeon: Gearlean Alf, MD;  Location: [facility];  Service: Orthopedics;  Laterality: Right;    Current Outpatient Prescriptions  Medication Sig Dispense Refill  . aspirin 81 MG tablet Take 81 mg by mouth daily.      [name] atorvastatin (LIPITOR) 80 MG tablet TAKE 1 TABLET EVERY DAY  90 tablet  1  . cetirizine (ZYRTEC) 10 MG tablet Take 10 mg by mouth daily.      . clonazePAM (KLONOPIN) 1 MG tablet TAKE 2 TABLETS AT BEDTIME AS NEEDED  60 tablet  0  . EPINEPHrine (EPI-PEN) 0.3 mg/0.3 mL SOAJ injection Inject into the muscle once as needed.      . gabapentin (NEURONTIN) 300 MG capsule Take 600 mg by mouth at bedtime.      . hydrochlorothiazide (HYDRODIURIL) 25 MG tablet Take 25 mg by mouth every morning.      . metFORMIN (GLUMETZA) 1000 MG  (MOD) 24 hr tablet Take 1 tablet (1,000 mg total) by mouth daily.  30 tablet  11  . metoprolol succinate (TOPROL-XL) 100 MG 24 hr tablet Take 100 mg by mouth at bedtime. Take with or immediately following a meal.      . Multiple Vitamins-Minerals (PRESERVISION AREDS PO) Take 1 tablet by mouth 2 (two) times daily.       . nitroGLYCERIN (NITROSTAT) 0.4 MG SL tablet Place 0.4 mg under the tongue every 5 (five) minutes as needed. For chest pain      . ONE TOUCH ULTRA TEST test strip Used to test blood sugar once daily dx: 250.00      . ONETOUCH DELICA LANCETS 32T MISC Used to test blood sugar once daily dx: 250.00  100 each  0  . pantoprazole (PROTONIX) 20 MG tablet Take 40 mg by mouth daily.      [name] venlafaxine XR (EFFEXOR-XR) 37.5 MG 24 hr capsule TAKE 1 CAPSULE TWICE DAILY  60 capsule  2   No current facility-administered medications for this visit.    Allergies:    Allergies  Allergen Reactions  . Percocet [Oxycodone-Acetaminophen] Other (See Comments)    hallucination  . Sulfa Antibiotics Other (See Comments)    hallucinations    Social History:  The patient  reports that she has never smoked. She has never used smokeless tobacco. She reports that she drinks about 1.2 ounces of alcohol per week. She reports that she does not use illicit drugs.   ROS:  Please see the history of present illness.   Denies any fevers, chills, orthopnea, PND, syncope    PHYSICAL EXAM: VS:  BP 145/56  Pulse 63  Ht 5\' 3"  (1.6 m)  Wt 181 lb (82.101 kg)  BMI 32.07 kg/m2 Well nourished, well developed, in no acute distress HEENT: normal Neck: no JVD Cardiac:  normal S1, S2; RRR; no murmurPrior bypass scar well healed Lungs:  clear to auscultation bilaterally, no wheezing, rhonchi or rales Abd: soft, nontender, no hepatomegaly Ext: no edema. Knee right scar healing, effusion noted . Skin: warm and dry Neuro: no focal abnormalities noted  EKG:  03/23/14-sinus rhythm, 63, nonspecific ST flattening. Overall  normal.   ASSESSMENT AND PLAN:  1. Coronary artery disease-status post bypass/DES placement. Overall doing well. Monitor her symptoms. Continue beta blocker. 2. S/p knee replacement 07/07/13 - Dr. Wynelle Link. Still taking pain medication for this. PT. Still having trouble with complete extension.  3. Hyperlipidemia-excellent lipids. Reviewed labs. Continue with atorvastatin. Dr. Diona Browner can continue to check . 4. HTN-mildly elevated. Continue  to monitor. Her diastolic is quite low. We will continue with current medications. 5. Diabetes-metformin. Glucose reviewed. She is recently changed primary physicians. She is quite pleased. 6. Obesity-encourage weight loss once knee pain/physical therapy is improving. Exercise.  7. We will see back in 6 months.  Signed, Candee Furbish, MD [facility]  03/23/2014 10:14 AM

## 2014-03-23 NOTE — Patient Instructions (Signed)
Your physician wants you to follow-up in: 6 months with Dr. Skains You will receive a reminder letter in the mail two months in advance. If you don't receive a letter, please call our office to schedule the follow-up appointment.  Your physician recommends that you continue on your current medications as directed. Please refer to the Current Medication list given to you today.  

## 2014-03-31 ENCOUNTER — Other Ambulatory Visit (INDEPENDENT_AMBULATORY_CARE_PROVIDER_SITE_OTHER): Payer: 59

## 2014-03-31 DIAGNOSIS — E119 Type 2 diabetes mellitus without complications: Secondary | ICD-10-CM

## 2014-03-31 LAB — HEMOGLOBIN A1C: Hgb A1c MFr Bld: 8.2 % — ABNORMAL HIGH (ref 4.6–6.5)

## 2014-04-01 ENCOUNTER — Other Ambulatory Visit: Payer: Self-pay | Admitting: Family Medicine

## 2014-04-07 ENCOUNTER — Other Ambulatory Visit: Payer: Medicare Other

## 2014-04-13 ENCOUNTER — Ambulatory Visit: Payer: Self-pay | Admitting: Family Medicine

## 2014-04-14 ENCOUNTER — Ambulatory Visit (INDEPENDENT_AMBULATORY_CARE_PROVIDER_SITE_OTHER): Payer: 59 | Admitting: Family Medicine

## 2014-04-14 ENCOUNTER — Encounter: Payer: Self-pay | Admitting: Family Medicine

## 2014-04-14 VITALS — BP 130/64 | HR 63 | Temp 97.7°F | Ht 63.0 in | Wt 184.5 lb

## 2014-04-14 DIAGNOSIS — N63 Unspecified lump in unspecified breast: Secondary | ICD-10-CM

## 2014-04-14 DIAGNOSIS — E119 Type 2 diabetes mellitus without complications: Secondary | ICD-10-CM

## 2014-04-14 DIAGNOSIS — E7800 Pure hypercholesterolemia, unspecified: Secondary | ICD-10-CM

## 2014-04-14 DIAGNOSIS — N6320 Unspecified lump in the left breast, unspecified quadrant: Secondary | ICD-10-CM

## 2014-04-14 DIAGNOSIS — I10 Essential (primary) hypertension: Secondary | ICD-10-CM

## 2014-04-14 DIAGNOSIS — L600 Ingrowing nail: Secondary | ICD-10-CM

## 2014-04-14 MED ORDER — CLONAZEPAM 1 MG PO TABS: ORAL_TABLET | ORAL | Status: DC

## 2014-04-14 MED ORDER — METFORMIN HCL ER 500 MG PO TB24: ORAL_TABLET | ORAL | Status: DC

## 2014-04-14 NOTE — Progress Notes (Signed)
Pre visit review using our clinic review tool, if applicable. No additional management support is needed unless otherwise documented below in the visit note. 

## 2014-04-14 NOTE — Assessment & Plan Note (Signed)
Well controlled. Continue current medication.  

## 2014-04-14 NOTE — Addendum Note (Signed)
Addended by: Carter Kitten on: 04/14/2014 12:12 PM   Modules accepted: Orders

## 2014-04-14 NOTE — Assessment & Plan Note (Signed)
UNresoloving.[name] Refer to podiatry for possible toenail removal.

## 2014-04-14 NOTE — Patient Instructions (Signed)
Increase exercise as tolerated. Increase gradually to 2000 mg daily of metformin extended release. Goal FBS 80-120, goal post prandial after meals is < 180. Follow up in 3 months for medicare wellness with labs prior.  Stop at front desk for referral to podiatry.

## 2014-04-14 NOTE — Assessment & Plan Note (Signed)
Due for mammogram and given finding needs diagnostic and [place].

## 2014-04-14 NOTE — Progress Notes (Signed)
73 year old female s/p TKR on right on 8/25 returns for 3 month follow up of DM   Has noted 1 month of left breast mass, mild tenderness. Mother with breast cancer history.  Due for annual mammo.  Diabetes: Worsened control despite increase of metformin.  Lab Results  Component Value Date   HGBA1C 8.2* 03/31/2014   Wt Readings from Last 3 Encounters:  04/14/14 184 lb 8 oz (83.689 kg)  03/23/14 181 lb (82.101 kg)  01/15/14 178 lb (80.74 kg)  Using medications without difficulties:None  Hypoglycemic episodes:None  Hyperglycemic episodes:Occ Feet problems:yes.. See below  Blood Sugars averaging: 130-200 fasting  eye exam within last year: no.. due   Elevated Cholesterol:At goal <70 ( hx of CAD) on lipitor 80 mg daily  Using medications without problems:None  Muscle aches: None  Diet compliance:Moderate.  Exercise:walking some but starting water aerobic.  Other complaints: . Lab Results  Component Value Date   CHOL 137 03/17/2014   HDL 54.20 03/17/2014   LDLCALC 67 03/17/2014   TRIG 77.0 03/17/2014   CHOLHDL 3 03/17/2014    Hypertension: At goal on metoprolol and HCTZ. BP Readings from Last 3 Encounters:  04/14/14 130/64  03/23/14 145/56  01/15/14 120/70  Using medication without problems or lightheadedness: None  Chest pain with exertion:None  Edema:None  Short of breath:None  Average home BPs: < 130/80  Other issues:   Review of Systems  Constitutional: Negative for fever and fatigue.  HENT: Negative for ear pain.  Eyes: Negative for pain.  Respiratory: Negative for chest tightness and shortness of breath.  Cardiovascular: Negative for chest pain, palpitations and leg swelling.  Gastrointestinal: Negative for abdominal pain.  Genitourinary: Negative for dysuria.  Objective:   Physical Exam  Constitutional: Vital signs are normal. She appears well-developed and well-nourished. She is cooperative. Non-toxic appearance. She does not appear ill. No distress.  HENT:  Head:  Normocephalic.  Right Ear: Hearing, tympanic membrane, external ear and ear canal normal. Tympanic membrane is not erythematous, not retracted and not bulging.  Left Ear: Hearing, tympanic membrane, external ear and ear canal normal. Tympanic membrane is not erythematous, not retracted and not bulging.  Nose: No mucosal edema or rhinorrhea. Right sinus exhibits no maxillary sinus tenderness and no frontal sinus tenderness. Left sinus exhibits no maxillary sinus tenderness and no frontal sinus tenderness.  Mouth/Throat: Uvula is midline, oropharynx is clear and moist and mucous membranes are normal.  Eyes: Conjunctivae, EOM and lids are normal. Pupils are equal, round, and reactive to light. Lids are everted and swept, no foreign bodies found.  Neck: Trachea normal and normal range of motion. Neck supple. Carotid bruit is not present. No mass and no thyromegaly present.  Cardiovascular: Normal rate, regular rhythm, S1 normal, S2 normal, normal heart sounds, intact distal pulses and normal pulses. Exam reveals no gallop and no friction rub.  No murmur heard.  Pulmonary/Chest: Effort normal and breath sounds normal. Not tachypneic. No respiratory distress. She has no decreased breath sounds. She has no wheezes. She has no rhonchi. She has no rales.  Abdominal: Soft. Normal appearance and bowel sounds are normal. There is no tenderness.  Neurological: She is alert.  Skin: Skin is warm, dry and intact. No rash noted.  Psychiatric: Her speech is normal and behavior is normal. Judgment and thought content normal. Her mood appears not anxious. Cognition and memory are normal. She does not exhibit a depressed mood.  Breast exam: Nl right ,left 1 cm mass deep at  1 oclock  Diabetic foot exam:  Normal inspection  No skin breakdown  No calluses  Normal DP pulses  Diminished sensation to light touch and monofilament  Nails: B great toe with erythema and discharge at lateral edge, right toenail edge tender.     Well healing right TKA scar.

## 2014-04-14 NOTE — Assessment & Plan Note (Signed)
Inadequate control... Increase to 2000 mg daily of metformin.

## 2014-04-14 NOTE — Addendum Note (Signed)
Addended by: Carter Kitten on: 04/14/2014 09:57 AM   Modules accepted: Orders

## 2014-04-15 ENCOUNTER — Ambulatory Visit: Payer: Self-pay | Admitting: Family Medicine

## 2014-04-16 ENCOUNTER — Encounter: Payer: Self-pay | Admitting: Family Medicine

## 2014-04-17 ENCOUNTER — Ambulatory Visit: Payer: 59 | Admitting: Podiatry

## 2014-04-17 ENCOUNTER — Encounter: Payer: Self-pay | Admitting: Podiatry

## 2014-04-17 VITALS — BP 126/66 | HR 68 | Resp 16 | Ht 63.0 in | Wt 182.0 lb

## 2014-04-17 DIAGNOSIS — L600 Ingrowing nail: Secondary | ICD-10-CM

## 2014-04-17 NOTE — Progress Notes (Signed)
   Subjective:    Patient ID: Morgan [name], female    DOB: 1941-09-20, 73 y.o.   MRN: [id number]  HPI Comments: Both of my big toenails are ingrown. i have had them 8 - 10 weeks. i went for a pedicure and this happened. i went to see dr Diona Browner and she put me on an antibiotic. The sides of my toenails hurt. The left one on (lateral) the right one (medial). i have soaked my toes in water and i used cotton.      Review of Systems  HENT: Positive for sinus pressure.   Musculoskeletal:       Joint pain Difficulty walking  Skin:       Change in nails Thick scars  Allergic/Immunologic: Positive for environmental allergies.  Hematological:       Slow to heal  All other systems reviewed and are negative.      Objective:   Physical Exam        Assessment & Plan:

## 2014-04-17 NOTE — Progress Notes (Signed)
Subjective:     Patient ID: Morgan [name], female   DOB: 18-Dec-1940, 73 y.o.   MRN: [id number]  HPI patient states I have these ingrown toenails on both my toes that we try to work on at the [employer] without success and air becoming more bothersome deformity and I cannot take care of   Review of Systems  All other systems reviewed and are negative.      Objective:   Physical Exam  Nursing note and vitals reviewed. Constitutional: She is oriented to person, place, and time.  Cardiovascular: Intact distal pulses.   Musculoskeletal: Normal range of motion.  Neurological: She is oriented to person, place, and time.  Skin: Skin is warm.   neurovascular status intact with muscle strength adequate and range of motion of the subtalar and midtarsal joint within normal limits. 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 I recommended long-term correction of deformity and she wants to have it done understanding risk and today I infiltrated 60 mg Xylocaine Marcaine mixture and each hallux exposed the lateral border and removed that lateral nail and matrix and applied phenol 3 applications 30 seconds followed by alcohol lavaged to each toe and then sterile dressing. Gait instructions on soaks and reappoint

## 2014-04-17 NOTE — Patient Instructions (Signed)

## 2014-04-19 ENCOUNTER — Emergency Department: Payer: Self-pay | Admitting: Emergency Medicine

## 2014-04-20 ENCOUNTER — Ambulatory Visit (INDEPENDENT_AMBULATORY_CARE_PROVIDER_SITE_OTHER)
Admission: RE | Admit: 2014-04-20 | Discharge: 2014-04-20 | Disposition: A | Discharge status: Home / Self Care | Payer: 59 | Source: Ambulatory Visit | Attending: Internal Medicine | Admitting: Internal Medicine

## 2014-04-20 ENCOUNTER — Ambulatory Visit (INDEPENDENT_AMBULATORY_CARE_PROVIDER_SITE_OTHER): Payer: 59 | Admitting: Internal Medicine

## 2014-04-20 ENCOUNTER — Telehealth: Payer: Self-pay

## 2014-04-20 ENCOUNTER — Encounter: Payer: Self-pay | Admitting: Internal Medicine

## 2014-04-20 ENCOUNTER — Encounter: Payer: Self-pay | Admitting: Family Medicine

## 2014-04-20 VITALS — BP 120/68 | HR 67 | Temp 98.2°F | Wt 185.0 lb

## 2014-04-20 DIAGNOSIS — W010XXA Fall on same level from slipping, tripping and stumbling without subsequent striking against object, initial encounter: Secondary | ICD-10-CM

## 2014-04-20 DIAGNOSIS — M25522 Pain in left elbow: Secondary | ICD-10-CM

## 2014-04-20 DIAGNOSIS — M25529 Pain in unspecified elbow: Secondary | ICD-10-CM

## 2014-04-20 MED ORDER — TRAMADOL HCL 50 MG PO TABS: 50.0000 mg | ORAL_TABLET | Freq: Three times a day (TID) | ORAL | Status: DC | PRN

## 2014-04-20 NOTE — Telephone Encounter (Signed)
Morgan [name] notified as instructed by telephone.  She is in agreement with plan.

## 2014-04-20 NOTE — Telephone Encounter (Signed)
Pt said started additional metformin 500 mg (24 hr tab) after seen on 04/14/14. Pt was taking metformin 1000 mg (24 hr tab) in AM and metformin 500 mg tab in evening. Pt developed diarrhea and pt stopped taking Metformin 500 mg last night. Pt is continuing taking the metformin 1000 mg in AM. Pt said diarrhea is better this morning. While taking both the 1000 mg and 500 mg FBS was in 130's.[facility]. Pt request cb.

## 2014-04-20 NOTE — Telephone Encounter (Signed)
This is very common with increasing metformin and almost always resolves within 2-3 weeks.  For now, take 1000 mg in the AM.  Then take 500 mg in the PM every other day for 2 weeks, then increase to 500 mg at night every day.

## 2014-04-20 NOTE — Progress Notes (Signed)
Subjective:    Patient ID: Morgan [name], female    DOB: 1941/01/16, 73 y.o.   MRN: [id number]  HPI  Pt presents to the clinic today with c/o left arm pan. She reports this occurred 2 days ago. She did experience a fall after tripping over a bar. She landed on her left elbow. She did hit her head but did not lose conscious. She did go to ER but left after 3 hours of sitting in the waiting room. She has tried ice and ibuprofen without any relief.  Review of Systems      Past Medical History  Diagnosis Date  . Hypertension   . High cholesterol   . Hyperlipidemia   . Angina   . Migraines     "til ~ 1980"  . Headache(784.0)     "recurring"  . Restless leg syndrome   . Weakness of right side of body     "I've had PT for it; they don't know what it's from".  CORTISONE INJECTION INTO BACK 08/30/12  . Diabetes mellitus     diet controlled  . Fibromyalgia   . Sciatic nerve pain     "from pinched nerve"  . Depression   . Atrial fibrillation     ASPIRIN FOR BLOOD THINNER  . Coronary artery disease   . GERD (gastroesophageal reflux disease)   . H/O hiatal hernia   . Complication of anesthesia     pt states has choking sensation with ET tube   . PONV (postoperative nausea and vomiting)   . Sleep apnea   . Arthritis   . Bulging discs     lumbar     Current Outpatient Prescriptions  Medication Sig Dispense Refill  . aspirin 81 MG tablet Take 81 mg by mouth daily.      [name] atorvastatin (LIPITOR) 80 MG tablet TAKE 1 TABLET EVERY DAY  90 tablet  1  . cetirizine (ZYRTEC) 10 MG tablet Take 10 mg by mouth daily.      . clonazePAM (KLONOPIN) 1 MG tablet TAKE 2 TABLETS AT BEDTIME AS NEEDED  60 tablet  0  . EPINEPHrine (EPI-PEN) 0.3 mg/0.3 mL SOAJ injection Inject into the muscle once as needed.      . gabapentin (NEURONTIN) 300 MG capsule Take 600 mg by mouth at bedtime.      . hydrochlorothiazide (HYDRODIURIL) 25 MG tablet Take 25 mg by mouth every morning.      . metFORMIN  (GLUCOPHAGE-XR) 500 MG 24 hr tablet Take 1 tablet along with the 1000 mg XR daily for 1 week, if tolerated increase to 2000 mg total daily.  30 tablet  11  . metFORMIN (GLUMETZA) 1000 MG (MOD) 24 hr tablet Take 1 tablet (1,000 mg total) by mouth daily.  30 tablet  11  . metoprolol succinate (TOPROL-XL) 100 MG 24 hr tablet Take 100 mg by mouth at bedtime. Take with or immediately following a meal.      . Multiple Vitamins-Minerals (PRESERVISION AREDS PO) Take 1 tablet by mouth 2 (two) times daily.       . nitroGLYCERIN (NITROSTAT) 0.4 MG SL tablet Place 0.4 mg under the tongue every 5 (five) minutes as needed. For chest pain      . ONE TOUCH ULTRA TEST test strip Used to test blood sugar once daily dx: 250.00      . ONETOUCH DELICA LANCETS 99B MISC Used to test blood sugar once daily dx: 250.00  100 each  0  .  pantoprazole (PROTONIX) 20 MG tablet TAKE 2 TABLETS BY MOUTH TWICE A DAY  360 tablet  1  . venlafaxine XR (EFFEXOR-XR) 37.5 MG 24 hr capsule TAKE 1 CAPSULE TWICE DAILY  60 capsule  2   No current facility-administered medications for this visit.    Allergies  Allergen Reactions  . Percocet [Oxycodone-Acetaminophen] Other (See Comments)    hallucination  . Sulfa Antibiotics Other (See Comments)    hallucinations    Family History  Problem Relation Age of Onset  . Cancer Mother 57    breast cancer  . Heart disease Father 83    sudden onset due to CAD  . Cancer Sister 34    colon  . Hypertension Sister   . Dementia Sister   . Diabetes Sister   . GER disease Daughter   . Hypertension Daughter     History   Social History  . Marital Status: Married    Spouse Name: N/A    Number of Children: N/A  . Years of Education: N/A   Occupational History  . Not on file.   Social History Main Topics  . Smoking status: Never Smoker   . Smokeless tobacco: Never Used  . Alcohol Use: 1.2 oz/week    2 Glasses of wine per week     Comment: "occasionally drink wine"  . Drug Use: No    . Sexual Activity: Yes   Other Topics Concern  . Not on file   Social History Narrative  . No narrative on file     Constitutional: Denies fever, malaise, fatigue, headache or abrupt weight changes.  Musculoskeletal: Pt reports left arm/elbow pain. Denies difficulty with gait, muscle pain.    No other specific complaints in a complete review of systems (except as listed in HPI above).  Objective:   Physical Exam   BP 120/68  Pulse 67  Temp(Src) 98.2 F (36.8 C) (Oral)  Wt 185 lb (83.915 kg)  SpO2 97% Wt Readings from Last 3 Encounters:  04/20/14 185 lb (83.915 kg)  04/17/14 182 lb (82.555 kg)  04/14/14 184 lb 8 oz (83.689 kg)    General: Appears her stated age, well developed, well nourished in NAD.  Cardiovascular: Normal rate and rhythm. S1,S2 noted.  No murmur, rubs or gallops noted. No JVD or BLE edema. No carotid bruits noted. Pulmonary/Chest: Normal effort and positive vesicular breath sounds. No respiratory distress. No wheezes, rales or ronchi noted.  Musculoskeletal:  Decrease extension/flexion of the left elbow secondary to pain. Normal internal and external ROM of the left shoulder.   BMET    Component Value Date/Time   NA 141 01/02/2014 0929   K 4.1 01/02/2014 0929   CL 100 01/02/2014 0929   CO2 33* 01/02/2014 0929   GLUCOSE 148* 01/02/2014 0929   BUN 19 01/02/2014 0929   CREATININE 0.9 01/02/2014 0929   CALCIUM 9.7 01/02/2014 0929   GFRNONAA 59* 07/09/2013 0455   GFRAA 68* 07/09/2013 0455    Lipid Panel     Component Value Date/Time   CHOL 137 03/17/2014 0951   TRIG 77.0 03/17/2014 0951   HDL 54.20 03/17/2014 0951   CHOLHDL 3 03/17/2014 0951   VLDL 15.4 03/17/2014 0951   LDLCALC 67 03/17/2014 0951    CBC    Component Value Date/Time   WBC 13.4* 07/09/2013 0455   RBC 3.03* 07/09/2013 0455   HGB 9.1* 07/09/2013 0455   HCT 27.5* 07/09/2013 0455   PLT 242 07/09/2013 0455   MCV 90.8  07/09/2013 0455   MCH 30.0 07/09/2013 0455   MCHC 33.1 07/09/2013 0455   RDW  13.0 07/09/2013 0455   LYMPHSABS 1.6 09/27/2011 1531   MONOABS 0.8 09/27/2011 1531   EOSABS 0.1 09/27/2011 1531   BASOSABS 0.0 09/27/2011 1531    Hgb A1C Lab Results  Component Value Date   HGBA1C 8.2* 03/31/2014        Assessment & Plan:   Injury to left elbow secondary to fall:  Will check xray of left elbow Tramadol given for pain relief Continue ice for comfort Pt placed in sling for comfort  Will follow up with you after xrays are back

## 2014-04-20 NOTE — Progress Notes (Signed)
Pre visit review using our clinic review tool, if applicable. No additional management support is needed unless otherwise documented below in the visit note. 

## 2014-04-20 NOTE — Telephone Encounter (Signed)
Left message for patient to return my call.

## 2014-04-20 NOTE — Patient Instructions (Addendum)

## 2014-04-22 ENCOUNTER — Encounter: Payer: Self-pay | Admitting: Family Medicine

## 2014-04-22 ENCOUNTER — Ambulatory Visit (INDEPENDENT_AMBULATORY_CARE_PROVIDER_SITE_OTHER): Payer: 59 | Admitting: Family Medicine

## 2014-04-22 VITALS — BP 120/60 | HR 67 | Temp 97.5°F | Ht 63.0 in | Wt 185.0 lb

## 2014-04-22 DIAGNOSIS — S52042A Displaced fracture of coronoid process of left ulna, initial encounter for closed fracture: Secondary | ICD-10-CM

## 2014-04-22 DIAGNOSIS — S52043A Displaced fracture of coronoid process of unspecified ulna, initial encounter for closed fracture: Secondary | ICD-10-CM

## 2014-04-22 NOTE — Progress Notes (Signed)
Pre visit review using our clinic review tool, if applicable. No additional management support is needed unless otherwise documented below in the visit note. 

## 2014-04-22 NOTE — Progress Notes (Signed)
[place] [address] Phone: [phone] Fax: [phone]  Patient ID: Morgan [name] MRN: [id number], DOB: [birth year], 73 y.o. Date of Encounter: 04/22/2014  Primary Physician:  Eliezer Lofts, MD   Chief Complaint: Fx of Left Elbow   Subjective:   History of Present Illness:  Morgan [name] is a 73 y.o. very pleasant female patient who presents with the following:  Initial date of injury, April 18, 2014.  The patient tripped, and she fell on her LEFT side and her elbow was hit rather hard. She placed her elbow and some ice, took some Motrin, and she has now some notable loss of motion. I was consulted in the office 2 days prior, and review the films at that time with our nurse practitioner. It did appear that the patient had a posterior fat pad, and she also appeared to have an avulsion fracture of her coronoid.  I instructed her to place her in a sling, and I would reevaluate her in 2 days' time. The patient is here today, she is relatively comfortable. She has not required any narcotics.  On further history, there is a significant history for traumatic fracture distally where the patient reports that she had a multiple bone fracture where she fractured her radius, ulna, and her humerus. Ultimately she did not achieve the perfect anatomical alignment, and she had some slight rotational abnormalities post treatment of her fracture. This has been a long-standing known issue for the patient prior to this injury.  Past Medical History, Surgical History, Social History, Family History, Problem List, Medications, and Allergies have been reviewed and updated if relevant.  Review of Systems:  GEN: No fevers, chills. Nontoxic. Primarily MSK c/o today. MSK: Detailed in the HPI GI: tolerating PO intake without difficulty Neuro: No numbness, parasthesias, or tingling associated. Otherwise the pertinent positives of the ROS are noted above.   Objective:   Physical  Examination: BP 120/60  Pulse 67  Temp(Src) 97.5 F (36.4 C) (Oral)  Ht 5\' 3"  (1.6 m)  Wt 185 lb (83.915 kg)  BMI 32.78 kg/m2   GEN: WDWN, NAD, Non-toxic, Alert & Oriented x 3 HEENT: Atraumatic, Normocephalic.  Ears and Nose: No external deformity. EXTR: No clubbing/cyanosis/edema NEURO: Normal gait.  PSYCH: Normally interactive. Conversant. Not depressed or anxious appearing.  Calm demeanor.    The patient is in a sling and comfortable. I minimally evaluated the patient's elbow, since starting noted she has a current fracture. He is tender to palpation more on the posterior aspect of her elbow.  Radiology: Dg Elbow Complete Left  04/20/2014   CLINICAL DATA:  Left elbow pain post fall  EXAM: LEFT ELBOW - COMPLETE 3+ VIEW  COMPARISON:  None.  FINDINGS: Four views of left elbow submitted. There is positive posterior fat pad sign due to joint effusion. There is avulsion fracture of coronoid process of proximal ulna.  IMPRESSION: Positive posterior fat pad sign. Avulsion fracture of coronoid process of proximal ulna.   Electronically Signed   By: Lahoma Crocker M.D.   On: 04/20/2014 14:42    Assessment & Plan:   Fracture of coronoid process of ulna, left, closed  I reviewed the patient her injury and her films with her in great detail. This appears to be an avulsion fracture, and I think she will do well with conservative management. I discussed with the patient that in all cases there is some potential risk that a broken bone can do more poorly, and if that is  the case, then we can involve elbow surgery if needed. I would anticipate that we'll need to follow her weekly for a month, and I'm to place her in a posterior splint for 3 weeks. Also reviewed in some cases the patient does not achieve full return to prior motion compared with range of motion prior to injury. In some cases physical therapy as needed, and occasionally operative intervention is needed. She indicated she understood all of  these things.  The patient is actually going to the beach, and she wanted to go to the beach for 2 weeks or more. I advised her that that is a very poor idea, and that she puts her elbow at great risk to do so. I recommended followup in one week.  A fiberglass posterior splint with the arm at 90, and the lower extremity and full supination was applied without difficulty this was held in place with Ace bandages, and I reapplied her sling.  Follow-up: Return in about 1 week (around 04/29/2014). Unless noted above, the patient is to follow-up if symptoms worsen. Red flags were reviewed with the patient.  Signed,  Maud Deed. Mayer Vondrak, MD, CAQ Sports Medicine   Discontinued Medications   No medications on file   Current Medications at Discharge:   Medication List       This list is accurate as of: 04/22/14 11:59 PM.  Always use your most recent med list.               aspirin 81 MG tablet  Take 81 mg by mouth daily.     atorvastatin 80 MG tablet  Commonly known as:  LIPITOR  TAKE 1 TABLET EVERY DAY     cetirizine 10 MG tablet  Commonly known as:  ZYRTEC  Take 10 mg by mouth daily.     clonazePAM 1 MG tablet  Commonly known as:  KLONOPIN  TAKE 2 TABLETS AT BEDTIME AS NEEDED     EPINEPHrine 0.3 mg/0.3 mL Soaj injection  Commonly known as:  EPI-PEN  Inject into the muscle once as needed.     gabapentin 300 MG capsule  Commonly known as:  NEURONTIN  Take 600 mg by mouth at bedtime.     hydrochlorothiazide 25 MG tablet  Commonly known as:  HYDRODIURIL  Take 25 mg by mouth every morning.     metFORMIN 1000 MG (MOD) 24 hr tablet  Commonly known as:  GLUMETZA  Take 1 tablet (1,000 mg total) by mouth daily.     metFORMIN 500 MG 24 hr tablet  Commonly known as:  GLUCOPHAGE-XR  Take 1 tablet along with the 1000 mg XR daily for 1 week, if tolerated increase to 2000 mg total daily.     metoprolol succinate 100 MG 24 hr tablet  Commonly known as:  TOPROL-XL  Take 100 mg by  mouth at bedtime. Take with or immediately following a meal.     nitroGLYCERIN 0.4 MG SL tablet  Commonly known as:  NITROSTAT  Place 0.4 mg under the tongue every 5 (five) minutes as needed. For chest pain     ONE TOUCH ULTRA TEST test strip  Generic drug:  glucose blood  Used to test blood sugar once daily dx: 294.76     ONETOUCH DELICA LANCETS 54Y Misc  Used to test blood sugar once daily dx: 250.00     pantoprazole 20 MG tablet  Commonly known as:  PROTONIX  TAKE 2 TABLETS BY MOUTH TWICE A DAY  PRESERVISION AREDS PO  Take 1 tablet by mouth 2 (two) times daily.     traMADol 50 MG tablet  Commonly known as:  ULTRAM  Take 1 tablet (50 mg total) by mouth every 8 (eight) hours as needed.     venlafaxine XR 37.5 MG 24 hr capsule  Commonly known as:  EFFEXOR-XR  TAKE 1 CAPSULE TWICE DAILY

## 2014-04-25 DIAGNOSIS — S52042A Displaced fracture of coronoid process of left ulna, initial encounter for closed fracture: Secondary | ICD-10-CM | POA: Insufficient documentation

## 2014-04-29 ENCOUNTER — Ambulatory Visit (INDEPENDENT_AMBULATORY_CARE_PROVIDER_SITE_OTHER): Payer: 59 | Admitting: Family Medicine

## 2014-04-29 ENCOUNTER — Ambulatory Visit (INDEPENDENT_AMBULATORY_CARE_PROVIDER_SITE_OTHER)
Admission: RE | Admit: 2014-04-29 | Discharge: 2014-04-29 | Disposition: A | Discharge status: Home / Self Care | Payer: 59 | Source: Ambulatory Visit | Attending: Family Medicine | Admitting: Family Medicine

## 2014-04-29 ENCOUNTER — Other Ambulatory Visit: Payer: Self-pay | Admitting: *Deleted

## 2014-04-29 ENCOUNTER — Encounter: Payer: Self-pay | Admitting: Family Medicine

## 2014-04-29 VITALS — BP 110/60 | HR 72 | Temp 97.9°F | Wt 181.0 lb

## 2014-04-29 DIAGNOSIS — S52042A Displaced fracture of coronoid process of left ulna, initial encounter for closed fracture: Secondary | ICD-10-CM

## 2014-04-29 DIAGNOSIS — S52043A Displaced fracture of coronoid process of unspecified ulna, initial encounter for closed fracture: Secondary | ICD-10-CM

## 2014-04-29 MED ORDER — ATORVASTATIN CALCIUM 80 MG PO TABS: ORAL_TABLET | ORAL | Status: DC

## 2014-04-29 NOTE — Progress Notes (Signed)
Pre visit review using our clinic review tool, if applicable. No additional management support is needed unless otherwise documented below in the visit note. 

## 2014-04-29 NOTE — Progress Notes (Signed)
Morgan [name] [address] Phone: [phone] Fax: [phone]  Patient ID: Morgan [name] MRN: [id number], DOB: 10-09-1941, 73 y.o. Date of Encounter: 04/29/2014  Primary Physician:  Morgan [name]   Chief Complaint: Follow-up   Subjective:   History of Present Illness:  Morgan [name] is a 73 y.o. very pleasant female patient who presents with the following:  The patient will week previously, and she does have a coronoid process fracture, and she has per her report no history of elbow dislocation. Initially I placed her in a long arm posterior splint and placed her in a sling. She has been through well from a pain management standpoint, she is here for followup and a recheck on her x-rays.  04/22/2014 Last OV with Morgan Loffler, [name]  Initial date of injury, April 18, 2014.  The patient tripped, and she fell on her LEFT side and her elbow was hit rather hard. She placed her elbow and some ice, took some Motrin, and she has now some notable loss of motion. I was consulted in the office 2 days prior, and review the films at that time with our nurse practitioner. It did appear that the patient had a posterior fat pad, and she also appeared to have an avulsion fracture of her coronoid.  I instructed her to place her in a sling, and I would reevaluate her in 2 days' time. The patient is here today, she is relatively comfortable. She has not required any narcotics.  On further history, there is a significant history for traumatic fracture distally where the patient reports that she had a multiple bone fracture where she fractured her radius, ulna, and her humerus. Ultimately she did not achieve the perfect anatomical alignment, and she had some slight rotational abnormalities post treatment of her fracture. This has been a long-standing known issue for the patient prior to this injury.  Past Medical History, Surgical History, Social History, Family History, Problem List,  Medications, and Allergies have been reviewed and updated if relevant.  Review of Systems:  GEN: No fevers, chills. Nontoxic. Primarily MSK c/o today. MSK: Detailed in the HPI GI: tolerating PO intake without difficulty Neuro: No numbness, parasthesias, or tingling associated. Otherwise the pertinent positives of the ROS are noted above.   Objective:   Physical Examination: BP 110/60  Pulse 72  Temp(Src) 97.9 F (36.6 C) (Tympanic)  Wt 181 lb (82.101 kg)  SpO2 97%   GEN: WDWN, NAD, Non-toxic, Alert & Oriented x 3 HEENT: Atraumatic, Normocephalic.  Ears and Nose: No external deformity. EXTR: No clubbing/cyanosis/edema NEURO: Normal gait.  PSYCH: Normally interactive. Conversant. Not depressed or anxious appearing.  Calm demeanor.    The patient is in a sling and comfortable. I minimally evaluated the patient's elbow, since starting noted she has a current fracture. He is tender to palpation more on the posterior aspect of her elbow.  Radiology: Dg Elbow Complete Left  04/29/2014   CLINICAL DATA:  Followup fracture.  Recent fall.  EXAM: LEFT ELBOW - COMPLETE 3+ VIEW  COMPARISON:  04/20/2014  FINDINGS: Prominent bony fragment in the region of the coronoid process medially as noted previously. This is larger than expected for an acute coronoid process avulsion fracture and most likely is chronic hyper trophic bone formation related to old injury. This is unchanged from the prior study. Lateral view is obliqued but there is no definite effusion.  Radial head in normal alignment and no fracture. No fracture of the olecranon or humerus.  IMPRESSION: Prominent bony fragment in the region of the medial coronoid process. This is larger than expected for acute fracture and may be chronic hypertrophic bone formation related to old injury. If this area is tender, further evaluation with CT may be helpful to determine if there is an acute fracture.   Electronically Signed   By: Morgan [name] M.D.    On: 04/29/2014 16:21   Dg Elbow Complete Left  04/20/2014   CLINICAL DATA:  Left elbow pain post fall  EXAM: LEFT ELBOW - COMPLETE 3+ VIEW  COMPARISON:  None.  FINDINGS: Four views of left elbow submitted. There is positive posterior fat pad sign due to joint effusion. There is avulsion fracture of coronoid process of proximal ulna.  IMPRESSION: Positive posterior fat pad sign. Avulsion fracture of coronoid process of proximal ulna.   Electronically Signed   By: Morgan [name] M.D.   On: 04/20/2014 14:42    Assessment & Plan:   Fracture of coronoid process of ulna, left, closed - Plan: DG Elbow Complete Left, Ambulatory referral to Orthopedic Surgery  >25 minutes spent in face to face time with patient, >50% spent in counselling or coordination of care: I agree with the interpretation from the x-rays from today, in looking at them in greater detail, the fracture fragment does look quite large, that in comparison with today, compared to last office visit, there was a slight change which may be rotational in nature in film angulation. She also does have this history of a prior fracture distantly - she did not think it was her elbow, but certainly could have been. With her pain level, I suspect that this is an occult fracture, at least in part. In thinking about this more, I already decided that the best course of action was to involve orthopedic surgery, and I called Morgan [name], and he kindly agreed to assist. The patient had some schedule / travel issues, so Morgan [name] agreed to help and will see the patient tomorrow morning.   04/22/2014 Last OV with Morgan Loffler, [name]  I reviewed the patient her injury and her films with her in great detail. This appears to be an avulsion fracture, and I think she will do well with conservative management. I discussed with the patient that in all cases there is some potential risk that a broken bone can do more poorly, and if that is the case, then we can involve elbow  surgery if needed. I would anticipate that we'll need to follow her weekly for a month, and I'm to place her in a posterior splint for 3 weeks. Also reviewed in some cases the patient does not achieve full return to prior motion compared with range of motion prior to injury. In some cases physical therapy as needed, and occasionally operative intervention is needed. She indicated she understood all of these things.  The patient is actually going to the beach, and she wanted to go to the beach for 2 weeks or more. I advised her that that is a very poor idea, and that she puts her elbow at great risk to do so. I recommended followup in one week.  A fiberglass posterior splint with the arm at 90, and the lower extremity and full supination was applied without difficulty this was held in place with Ace bandages, and I reapplied her sling.  Signed,  Maud Deed. Copland, [name], CAQ Sports Medicine   Discontinued Medications   No medications on file   Current Medications at  Discharge:   Medication List       This list is accurate as of: 04/29/14  5:38 PM.  Always use your most recent med list.               aspirin 81 MG tablet  Take 81 mg by mouth daily.     atorvastatin 80 MG tablet  Commonly known as:  LIPITOR  TAKE 1 TABLET EVERY DAY     cetirizine 10 MG tablet  Commonly known as:  ZYRTEC  Take 10 mg by mouth daily.     clonazePAM 1 MG tablet  Commonly known as:  KLONOPIN  TAKE 2 TABLETS AT BEDTIME AS NEEDED     EPINEPHrine 0.3 mg/0.3 mL Soaj injection  Commonly known as:  EPI-PEN  Inject into the muscle once as needed.     gabapentin 300 MG capsule  Commonly known as:  NEURONTIN  Take 600 mg by mouth at bedtime.     hydrochlorothiazide 25 MG tablet  Commonly known as:  HYDRODIURIL  Take 25 mg by mouth every morning.     metFORMIN 1000 MG (MOD) 24 hr tablet  Commonly known as:  GLUMETZA  Take 1 tablet (1,000 mg total) by mouth daily.     metFORMIN 500 MG 24 hr tablet    Commonly known as:  GLUCOPHAGE-XR  Take 1 tablet along with the 1000 mg XR daily for 1 week, if tolerated increase to 2000 mg total daily.     metoprolol succinate 100 MG 24 hr tablet  Commonly known as:  TOPROL-XL  Take 100 mg by mouth at bedtime. Take with or immediately following a meal.     nitroGLYCERIN 0.4 MG SL tablet  Commonly known as:  NITROSTAT  Place 0.4 mg under the tongue every 5 (five) minutes as needed. For chest pain     ONE TOUCH ULTRA TEST test strip  Generic drug:  glucose blood  Used to test blood sugar once daily dx: 841.32     ONETOUCH DELICA LANCETS 44W Misc  Used to test blood sugar once daily dx: 250.00     pantoprazole 20 MG tablet  Commonly known as:  PROTONIX  TAKE 2 TABLETS BY MOUTH TWICE A DAY     PRESERVISION AREDS PO  Take 1 tablet by mouth 2 (two) times daily.     traMADol 50 MG tablet  Commonly known as:  ULTRAM  Take 1 tablet (50 mg total) by mouth every 8 (eight) hours as needed.     venlafaxine XR 37.5 MG 24 hr capsule  Commonly known as:  EFFEXOR-XR  TAKE 1 CAPSULE TWICE DAILY

## 2014-04-29 NOTE — Patient Instructions (Signed)
Dr. Stefani Dama [facility] [address]  [place], [place] [address]

## 2014-05-10 ENCOUNTER — Other Ambulatory Visit: Payer: Self-pay | Admitting: Family Medicine

## 2014-05-11 ENCOUNTER — Other Ambulatory Visit: Payer: Self-pay | Admitting: Family Medicine

## 2014-05-19 ENCOUNTER — Encounter: Payer: Self-pay | Admitting: Family Medicine

## 2014-05-19 ENCOUNTER — Ambulatory Visit (INDEPENDENT_AMBULATORY_CARE_PROVIDER_SITE_OTHER): Payer: 59 | Admitting: Family Medicine

## 2014-05-19 VITALS — BP 116/60 | HR 64 | Temp 97.5°F | Ht 63.0 in | Wt 184.5 lb

## 2014-05-19 DIAGNOSIS — E119 Type 2 diabetes mellitus without complications: Secondary | ICD-10-CM

## 2014-05-19 DIAGNOSIS — G459 Transient cerebral ischemic attack, unspecified: Secondary | ICD-10-CM

## 2014-05-19 DIAGNOSIS — Z8673 Personal history of transient ischemic attack (TIA), and cerebral infarction without residual deficits: Secondary | ICD-10-CM | POA: Insufficient documentation

## 2014-05-19 DIAGNOSIS — I10 Essential (primary) hypertension: Secondary | ICD-10-CM

## 2014-05-19 DIAGNOSIS — E7800 Pure hypercholesterolemia, unspecified: Secondary | ICD-10-CM

## 2014-05-19 NOTE — Patient Instructions (Addendum)
Increase aspirin to 325 mg daily. Stop at front desk on way out to schedule ECHo and carotid dopplers.  Stay on  back to 1000 mg  In AM and 500 mg in PM daily of metformin.  Check blood sugar in AM fasting ( goal 80-120) and occ 2 hours after a meal ( goal < 180).  Keep  For follow up as scheduled.

## 2014-05-19 NOTE — Progress Notes (Signed)
Pre visit review using our clinic review tool, if applicable. No additional management support is needed unless otherwise documented below in the visit note. 

## 2014-05-19 NOTE — Assessment & Plan Note (Signed)
Already at goal < 70 LDL.,

## 2014-05-19 NOTE — Assessment & Plan Note (Signed)
Improved control on 1500 mg daily metformin. Will continue this dose for now as 2000 mg daily not tolerated.  At follow up may need to add additional med.  Encouraged exercise, weight loss, healthy eating habits.

## 2014-05-19 NOTE — Progress Notes (Signed)
Subjective:    Patient ID: Morgan [name], female    DOB: 06/22/1941, 73 y.o.   MRN: [id number]  HPI  73 year old female presents with ongoing issues with her metformin for DM.  She was also seen by her eye MD 2 weeks ago for sensation of "bubble in both eyes, black spots, difficulty focusing" lasting a few hours and she had an nml eye exam. Eye MD felt that suggested that she had had a TIA and needed to follow up. She has had no further symptoms.  She had not skipped a meal, blood sugar was around 160 . No med changes associated. BP well controlled at  Home BP Readings from Last 3 Encounters:  05/19/14 116/60  04/29/14 110/60  04/22/14 120/60      She was increased to 2000 mg daily of metformin in 04/2014.  She reports diarrhea. Tried taking it 1000 mg in AM and 500 mg at night every other day.  She had no problems at lower dose. She has not been exercising, trying to work on diet.  She is at goal LD < 70 on lipitor 80 mg daily given CAD history. Lab Results  Component Value Date   CHOL 137 03/17/2014   HDL 54.20 03/17/2014   LDLCALC 67 03/17/2014   TRIG 77.0 03/17/2014   CHOLHDL 3 03/17/2014    Lab Results  Component Value Date   HGBA1C 8.2* 03/31/2014  FBS lately 114-140, 2 hours after meals  She was on plavix for 1 year after stent. Stopped due to easy bruising.  No stomach bleeding, remote hx of ulcer 30 years ago.  Review of Systems  Constitutional: Negative for fever and fatigue.  HENT: Negative for ear pain.   Eyes: Negative for pain.  Respiratory: Negative for chest tightness and shortness of breath.   Cardiovascular: Negative for chest pain, palpitations and leg swelling.  Gastrointestinal: Negative for abdominal pain.  Genitourinary: Negative for dysuria.       Objective:   Physical Exam  Constitutional: She is oriented to person, place, and time. Vital signs are normal. She appears well-developed and well-nourished. She is cooperative.  Non-toxic appearance.  She does not appear ill. No distress.  HENT:  Head: Normocephalic.  Right Ear: Hearing, tympanic membrane, external ear and ear canal normal. Tympanic membrane is not erythematous, not retracted and not bulging.  Left Ear: Hearing, tympanic membrane, external ear and ear canal normal. Tympanic membrane is not erythematous, not retracted and not bulging.  Nose: No mucosal edema or rhinorrhea. Right sinus exhibits no maxillary sinus tenderness and no frontal sinus tenderness. Left sinus exhibits no maxillary sinus tenderness and no frontal sinus tenderness.  Mouth/Throat: Uvula is midline, oropharynx is clear and moist and mucous membranes are normal.  Eyes: Conjunctivae, EOM and lids are normal. Pupils are equal, round, and reactive to light. Lids are everted and swept, no foreign bodies found.  Neck: Trachea normal and normal range of motion. Neck supple. Carotid bruit is not present. No mass and no thyromegaly present.  Cardiovascular: Normal rate, regular rhythm, S1 normal, S2 normal, normal heart sounds, intact distal pulses and normal pulses.  Exam reveals no gallop and no friction rub.   No murmur heard. Pulmonary/Chest: Effort normal and breath sounds normal. Not tachypneic. No respiratory distress. She has no decreased breath sounds. She has no wheezes. She has no rhonchi. She has no rales.  Abdominal: Soft. Normal appearance and bowel sounds are normal. There is no tenderness.  Neurological: She  is alert and oriented to person, place, and time. She has normal strength and normal reflexes. No cranial nerve deficit or sensory deficit. She exhibits normal muscle tone. She displays a negative Romberg sign. Coordination and gait normal. GCS eye subscore is 4. GCS verbal subscore is 5. GCS motor subscore is 6.  Nml cerebellar exam   No papilledema  Skin: Skin is warm, dry and intact. No rash noted.  Psychiatric: She has a normal mood and affect. Her speech is normal and behavior is normal.  Judgment and thought content normal. Her mood appears not anxious. Cognition and memory are normal. Cognition and memory are not impaired. She does not exhibit a depressed mood. She exhibits normal recent memory and normal remote memory.          Assessment & Plan:

## 2014-05-19 NOTE — Assessment & Plan Note (Signed)
Nml neuro exam. Symptoms are not clearly from any other source. Expect secondary to TIA.  Needs better risk factor control LDL at goal but A1C high.  Change to 325 mg daily of ASA.  Eval with ECHO and dopplers.

## 2014-05-19 NOTE — Assessment & Plan Note (Signed)
Well controlled. Continue current medication.  

## 2014-05-21 ENCOUNTER — Other Ambulatory Visit: Payer: Self-pay | Admitting: Family Medicine

## 2014-05-21 NOTE — Telephone Encounter (Signed)
Last office visit 05/19/2014.  Last refilled 04/14/2014 for #60.  Ok to refill?

## 2014-05-22 NOTE — Telephone Encounter (Signed)
Called to [facility] [address]

## 2014-05-23 ENCOUNTER — Other Ambulatory Visit: Payer: Self-pay | Admitting: Family Medicine

## 2014-05-27 ENCOUNTER — Other Ambulatory Visit (HOSPITAL_COMMUNITY): Payer: Self-pay | Admitting: Cardiology

## 2014-05-27 DIAGNOSIS — H538 Other visual disturbances: Secondary | ICD-10-CM

## 2014-06-02 ENCOUNTER — Ambulatory Visit (HOSPITAL_BASED_OUTPATIENT_CLINIC_OR_DEPARTMENT_OTHER): Payer: Medicare Other | Admitting: Cardiology

## 2014-06-02 ENCOUNTER — Ambulatory Visit (HOSPITAL_COMMUNITY): Payer: Medicare Other | Attending: Family Medicine

## 2014-06-02 DIAGNOSIS — I2510 Atherosclerotic heart disease of native coronary artery without angina pectoris: Secondary | ICD-10-CM | POA: Insufficient documentation

## 2014-06-02 DIAGNOSIS — I079 Rheumatic tricuspid valve disease, unspecified: Secondary | ICD-10-CM | POA: Insufficient documentation

## 2014-06-02 DIAGNOSIS — G4733 Obstructive sleep apnea (adult) (pediatric): Secondary | ICD-10-CM | POA: Diagnosis not present

## 2014-06-02 DIAGNOSIS — IMO0001 Reserved for inherently not codable concepts without codable children: Secondary | ICD-10-CM | POA: Diagnosis not present

## 2014-06-02 DIAGNOSIS — H538 Other visual disturbances: Secondary | ICD-10-CM

## 2014-06-02 DIAGNOSIS — E669 Obesity, unspecified: Secondary | ICD-10-CM | POA: Diagnosis not present

## 2014-06-02 DIAGNOSIS — I6529 Occlusion and stenosis of unspecified carotid artery: Secondary | ICD-10-CM

## 2014-06-02 DIAGNOSIS — H53129 Transient visual loss, unspecified eye: Secondary | ICD-10-CM

## 2014-06-02 DIAGNOSIS — E785 Hyperlipidemia, unspecified: Secondary | ICD-10-CM | POA: Diagnosis not present

## 2014-06-02 DIAGNOSIS — I4891 Unspecified atrial fibrillation: Secondary | ICD-10-CM | POA: Insufficient documentation

## 2014-06-02 DIAGNOSIS — K219 Gastro-esophageal reflux disease without esophagitis: Secondary | ICD-10-CM | POA: Insufficient documentation

## 2014-06-02 DIAGNOSIS — G459 Transient cerebral ischemic attack, unspecified: Secondary | ICD-10-CM | POA: Diagnosis present

## 2014-06-02 DIAGNOSIS — E119 Type 2 diabetes mellitus without complications: Secondary | ICD-10-CM | POA: Insufficient documentation

## 2014-06-02 DIAGNOSIS — I059 Rheumatic mitral valve disease, unspecified: Secondary | ICD-10-CM | POA: Insufficient documentation

## 2014-06-02 DIAGNOSIS — Z6832 Body mass index (BMI) 32.0-32.9, adult: Secondary | ICD-10-CM | POA: Diagnosis not present

## 2014-06-02 DIAGNOSIS — I10 Essential (primary) hypertension: Secondary | ICD-10-CM | POA: Insufficient documentation

## 2014-06-02 NOTE — Progress Notes (Signed)
Carotid duplex performed 

## 2014-06-02 NOTE — Progress Notes (Signed)
2D Echo completed. 06/02/2014 

## 2014-06-08 ENCOUNTER — Other Ambulatory Visit: Payer: Self-pay | Admitting: Family Medicine

## 2014-06-22 ENCOUNTER — Encounter: Payer: Self-pay | Admitting: Orthopedic Surgery

## 2014-07-03 ENCOUNTER — Other Ambulatory Visit: Payer: Self-pay | Admitting: Family Medicine

## 2014-07-03 NOTE — Telephone Encounter (Signed)
Last office visit 05/19/2014.  Last refilled 05/22/2014 for #60 with no refills.  Ok to refill?

## 2014-07-03 NOTE — Telephone Encounter (Signed)
Called to [facility] [address]

## 2014-07-14 ENCOUNTER — Encounter: Payer: Self-pay | Admitting: Orthopedic Surgery

## 2014-07-15 ENCOUNTER — Telehealth: Payer: Self-pay | Admitting: Family Medicine

## 2014-07-15 DIAGNOSIS — E119 Type 2 diabetes mellitus without complications: Secondary | ICD-10-CM

## 2014-07-15 DIAGNOSIS — E7800 Pure hypercholesterolemia, unspecified: Secondary | ICD-10-CM

## 2014-07-15 MED ORDER — HYDROCORTISONE ACE-PRAMOXINE 2.5-1 % RE CREA: 1.0000 "application " | TOPICAL_CREAM | Freq: Three times a day (TID) | RECTAL | Status: DC

## 2014-07-15 NOTE — Telephone Encounter (Signed)
Message copied by Jinny Sanders on Wed Jul 15, 2014 10:45 PM ------      Message from: Ellamae Sia      Created: Fri Jul 10, 2014  9:51 AM      Regarding: Lab orders for Thursday, 9.3.15       Patient is scheduled for CPX labs, please order future labs, Thanks , Terri       ------

## 2014-07-16 ENCOUNTER — Other Ambulatory Visit (INDEPENDENT_AMBULATORY_CARE_PROVIDER_SITE_OTHER): Payer: Medicare Other

## 2014-07-16 ENCOUNTER — Encounter: Payer: Self-pay | Admitting: Radiology

## 2014-07-16 ENCOUNTER — Telehealth: Payer: Self-pay

## 2014-07-16 DIAGNOSIS — I10 Essential (primary) hypertension: Secondary | ICD-10-CM

## 2014-07-16 DIAGNOSIS — E7800 Pure hypercholesterolemia, unspecified: Secondary | ICD-10-CM

## 2014-07-16 DIAGNOSIS — E119 Type 2 diabetes mellitus without complications: Secondary | ICD-10-CM

## 2014-07-16 LAB — LIPID PANEL
Cholesterol: 124 mg/dL (ref 0–200)
HDL: 45.2 mg/dL (ref >39.00)
LDL Cholesterol: 59 mg/dL (ref 0–99)
NonHDL: 78.8
TRIGLYCERIDES: 99 mg/dL (ref 0.0–149.0)
Total CHOL/HDL Ratio: 3
VLDL: 19.8 mg/dL (ref 0.0–40.0)

## 2014-07-16 LAB — COMPREHENSIVE METABOLIC PANEL
ALT: 37 U/L — AB (ref 0–35)
AST: 25 U/L (ref 0–37)
Albumin: 3.5 g/dL (ref 3.5–5.2)
Alkaline Phosphatase: 102 U/L (ref 39–117)
BUN: 23 mg/dL (ref 6–23)
CO2: 29 meq/L (ref 19–32)
CREATININE: 1 mg/dL (ref 0.4–1.2)
Calcium: 9.1 mg/dL (ref 8.4–10.5)
Chloride: 102 mEq/L (ref 96–112)
GFR: 61.4 mL/min (ref >60.00)
GLUCOSE: 140 mg/dL — AB (ref 70–99)
Potassium: 3.6 mEq/L (ref 3.5–5.1)
Sodium: 139 mEq/L (ref 135–145)
Total Bilirubin: 0.8 mg/dL (ref 0.2–1.2)
Total Protein: 6.7 g/dL (ref 6.0–8.3)

## 2014-07-16 LAB — MICROALBUMIN / CREATININE URINE RATIO
Creatinine,U: 129.6 mg/dL
MICROALB UR: 1.1 mg/dL (ref 0.0–1.9)
Microalb Creat Ratio: 0.8 mg/g (ref 0.0–30.0)

## 2014-07-16 LAB — HEMOGLOBIN A1C: HEMOGLOBIN A1C: 8.2 % — AB (ref 4.6–6.5)

## 2014-07-16 NOTE — Telephone Encounter (Signed)
Let pt know it must have been an accident.  I was not on the computer at &:45 AM this morning

## 2014-07-16 NOTE — Addendum Note (Signed)
Addended by: Daralene Milch C on: 07/16/2014 09:30 AM   Modules accepted: Orders

## 2014-07-16 NOTE — Telephone Encounter (Signed)
I called and spoke to Mrs. Hockey.  I can not find any reason why this prescription was sent in to her pharmacy.  I do see where Terri sent a message to Dr. Diona Browner asking for lab orders and a refill was sent in on that same message but I agree that the request does not appear to have come from her or from the pharmacy.  I will forward message to Dr. Diona Browner to see if she know why this was sent [facility] to [facility] but I advised patient to call her pharmacy and ask if they will let her return it.

## 2014-07-16 NOTE — Telephone Encounter (Signed)
Pt said she went to [facility] today to pick up refill pantoprazole (pt had refill available already). Pt said she did not call pharmacy or our office for any type cream; pt said she picked up pantoprazole and Hydrocort - pramoxine rectal cream. Spoke with [facility] and Hydrocort-pramoxine rectal cream was sent electronically by Dr Diona Browner this morning at 7:25 AM. [facility] said they did not request refill of Hydrocort pramoxine rectal cream. Pt said she is not having a problem and will not use med but request cb to advise why med was sent to pharmacy. Pt request cb.[name]

## 2014-07-17 NOTE — Telephone Encounter (Signed)
Morgan [name] notified as instructed by telephone.  [facility] is [facility] to [facility] today to see if they will take it back.

## 2014-07-22 ENCOUNTER — Other Ambulatory Visit: Payer: Self-pay | Admitting: Family Medicine

## 2014-07-23 ENCOUNTER — Encounter: Payer: Self-pay | Admitting: Family Medicine

## 2014-07-23 ENCOUNTER — Ambulatory Visit (INDEPENDENT_AMBULATORY_CARE_PROVIDER_SITE_OTHER): Payer: Medicare Other | Admitting: Family Medicine

## 2014-07-23 VITALS — BP 124/60 | HR 62 | Temp 97.8°F | Ht 63.0 in | Wt 186.5 lb

## 2014-07-23 DIAGNOSIS — F32A Depression, unspecified: Secondary | ICD-10-CM

## 2014-07-23 DIAGNOSIS — E2839 Other primary ovarian failure: Secondary | ICD-10-CM

## 2014-07-23 DIAGNOSIS — Z23 Encounter for immunization: Secondary | ICD-10-CM

## 2014-07-23 DIAGNOSIS — Z860100 Personal history of colon polyps, unspecified: Secondary | ICD-10-CM

## 2014-07-23 DIAGNOSIS — I10 Essential (primary) hypertension: Secondary | ICD-10-CM

## 2014-07-23 DIAGNOSIS — F3289 Other specified depressive episodes: Secondary | ICD-10-CM

## 2014-07-23 DIAGNOSIS — IMO0001 Reserved for inherently not codable concepts without codable children: Secondary | ICD-10-CM

## 2014-07-23 DIAGNOSIS — E1165 Type 2 diabetes mellitus with hyperglycemia: Secondary | ICD-10-CM

## 2014-07-23 DIAGNOSIS — Z0000 Encounter for general adult medical examination without abnormal findings: Secondary | ICD-10-CM

## 2014-07-23 DIAGNOSIS — F329 Major depressive disorder, single episode, unspecified: Secondary | ICD-10-CM

## 2014-07-23 DIAGNOSIS — E7800 Pure hypercholesterolemia, unspecified: Secondary | ICD-10-CM

## 2014-07-23 LAB — HM DIABETES FOOT EXAM

## 2014-07-23 MED ORDER — LIRAGLUTIDE 18 MG/3ML ~~LOC~~ SOPN: 0.6000 mg | PEN_INJECTOR | Freq: Every day | SUBCUTANEOUS | Status: DC

## 2014-07-23 NOTE — Assessment & Plan Note (Signed)
Well controlled. Continue current medication.  

## 2014-07-23 NOTE — Assessment & Plan Note (Signed)
Continue metformin ( intolerant of higher doses). Add victoza, call after 1 week to titrate up dose. Encouraged exercise, weight loss, healthy eating habits.

## 2014-07-23 NOTE — Progress Notes (Signed)
Pre visit review using our clinic review tool, if applicable. No additional management support is needed unless otherwise documented below in the visit note. 

## 2014-07-23 NOTE — Progress Notes (Signed)
73 year old female presents for annual exam.  She was also seen by her eye MD months ago  for sensation of "bubble in both eyes, black spots, difficulty focusing" lasting a few hours and she had an nml eye exam. Eye MD felt that suggested that she had had a TIA and needed to follow up.  She has had no further symptoms.   She had not skipped a meal, blood sugar was around 160 .  No med changes associated. BP well controlled at Home  BP Readings from Last 3 Encounters:  07/23/14 124/60  05/19/14 116/60  04/29/14 110/60    Depression: well controlled on venlafaxine.  Diabetes:  Inadequate control on 1000 mg metformin daily. Lab Results  Component Value Date   HGBA1C 8.2* 07/16/2014  Using medications without difficulties:occ diarrhea on this lower dose. Hypoglycemic episodes:None Hyperglycemic episodes:None Feet problems:None, thickened toenails Blood Sugars averaging: FBS 130-190, 2 hours meals: not checking eye exam within last year: yes She has not been exercising, trying to work on diet.   She is at goal LDL < 70 on lipitor 80 mg daily given CAD, TIA history.  Lab Results  Component Value Date   CHOL 124 07/16/2014   HDL 45.20 07/16/2014   LDLCALC 59 07/16/2014   TRIG 99.0 07/16/2014   CHOLHDL 3 07/16/2014   She was on plavix for 1 year after stent. Stopped due to easy bruising. No stomach bleeding, remote hx of ulcer 30 years ago.   Review of Systems  Constitutional: Negative for fever and fatigue.  HENT: Negative for ear pain.  Eyes: Negative for pain.  Respiratory: Negative for chest tightness and shortness of breath.  Cardiovascular: Negative for chest pain, palpitations and leg swelling.  Gastrointestinal: Negative for abdominal pain.  Genitourinary: Negative for dysuria.  Objective:   Physical Exam  Constitutional: She is oriented to person, place, and time. Vital signs are normal. She appears well-developed and well-nourished. She is cooperative. Non-toxic appearance. She  does not appear ill. No distress.  HENT:  Head: Normocephalic.  Right Ear: Hearing, tympanic membrane, external ear and ear canal normal. Tympanic membrane is not erythematous, not retracted and not bulging.  Left Ear: Hearing, tympanic membrane, external ear and ear canal normal. Tympanic membrane is not erythematous, not retracted and not bulging.  Nose: No mucosal edema or rhinorrhea. Right sinus exhibits no maxillary sinus tenderness and no frontal sinus tenderness. Left sinus exhibits no maxillary sinus tenderness and no frontal sinus tenderness.  Mouth/Throat: Uvula is midline, oropharynx is clear and moist and mucous membranes are normal.  Eyes: Conjunctivae, EOM and lids are normal. Pupils are equal, round, and reactive to light. Lids are everted and swept, no foreign bodies found.  Neck: Trachea normal and normal range of motion. Neck supple. Carotid bruit is not present. No mass and no thyromegaly present.  Cardiovascular: Normal rate, regular rhythm, S1 normal, S2 normal, normal heart sounds, intact distal pulses and normal pulses. Exam reveals no gallop and no friction rub.  No murmur heard.  Pulmonary/Chest: Effort normal and breath sounds normal. Not tachypneic. No respiratory distress. She has no decreased breath sounds. She has no wheezes. She has no rhonchi. She has no rales.  Abdominal: Soft. Normal appearance and bowel sounds are normal. There is no tenderness.  Neurological: She is alert and oriented to person, place, and time. She has normal strength and normal reflexes. No cranial nerve deficit or sensory deficit. She exhibits normal muscle tone. She displays a negative Romberg  sign. Coordination and gait normal. GCS eye subscore is 4. GCS verbal subscore is 5. GCS motor subscore is 6.  Nml cerebellar exam  No papilledema  Skin: Skin is warm, dry and intact. No rash noted.  Psychiatric: She has a normal mood and affect. Her speech is normal and behavior is normal. Judgment and  thought content normal. Her mood appears not anxious. Cognition and memory are normal. Cognition and memory are not impaired. She does not exhibit a depressed mood. She exhibits normal recent memory and normal remote memory.   Diabetic foot exam: Normal inspection No skin breakdown No calluses  Normal DP pulses Normal sensation to light touch and monofilament Nails thickened  Assessment and PLAN:  The patient's preventative maintenance and recommended screening tests for an annual wellness exam were reviewed in full today. Brought up to date unless services declined.  Counselled on the importance of diet, exercise, and its role in overall health and mortality. The patient's FH and SH was reviewed, including their home life, tobacco status, and drug and alcohol status.    Vaccines: due for flu and prevnar COLON: At [facility], Dr. Wyvonnia Dusky, last 2010, due now . Sister with strong family history colon cancer.  Mammo: 04/2014 nml  DVE/pap: no pap indicated, plan q2 year DVE. DEXA:  Has had normal per pt 8 years ago.

## 2014-07-23 NOTE — Patient Instructions (Addendum)
Continue metformin at 1000 mg daily. Start victoza 0.6 mg daily. Call or MyChart how you are tolerating thing in next 1-2 weeks. Stop at front desk to set up colonoscopy and DEXA.

## 2014-07-29 ENCOUNTER — Telehealth: Payer: Self-pay | Admitting: Family Medicine

## 2014-07-29 NOTE — Telephone Encounter (Signed)
PT CHECKING ON HER COLONSCOPY DR Renard Hamper IN [place]

## 2014-08-04 ENCOUNTER — Encounter: Payer: Self-pay | Admitting: Family Medicine

## 2014-08-06 ENCOUNTER — Telehealth: Payer: Self-pay | Admitting: Family Medicine

## 2014-08-06 NOTE — Telephone Encounter (Signed)
Pt had achilles bone density on 07/28/14. Do you still want full body bone density done?

## 2014-08-06 NOTE — Telephone Encounter (Signed)
I typically recommend full bone density as heel density is no representative of rest of body ( ie hip and spine). Final decision is up to her.  Please document what the results of heel bone density were.

## 2014-08-07 NOTE — Telephone Encounter (Signed)
Left message for patient to return my call.

## 2014-08-07 NOTE — Telephone Encounter (Signed)
Ms Husak notified that Dr. Diona Browner would still recommend the full bone density just because the heel density is not representative of the rest of the body like her hip and spine.

## 2014-08-10 ENCOUNTER — Other Ambulatory Visit: Payer: Self-pay | Admitting: Family Medicine

## 2014-08-10 ENCOUNTER — Encounter: Payer: Self-pay | Admitting: Family Medicine

## 2014-08-10 NOTE — Telephone Encounter (Signed)
Last office visit 07/23/2014.  Last refilled 07/03/2014 for #60.  Ok to refill?

## 2014-08-10 NOTE — Telephone Encounter (Signed)
Called to [facility] [address]

## 2014-08-12 ENCOUNTER — Encounter: Payer: Self-pay | Admitting: Gastroenterology

## 2014-09-14 ENCOUNTER — Ambulatory Visit: Payer: Self-pay | Admitting: Family Medicine

## 2014-09-14 ENCOUNTER — Encounter: Payer: Self-pay | Admitting: Family Medicine

## 2014-09-15 ENCOUNTER — Encounter: Payer: Self-pay | Admitting: Family Medicine

## 2014-09-15 DIAGNOSIS — M8580 Other specified disorders of bone density and structure, unspecified site: Secondary | ICD-10-CM | POA: Insufficient documentation

## 2014-09-16 ENCOUNTER — Other Ambulatory Visit: Payer: Self-pay | Admitting: *Deleted

## 2014-09-16 NOTE — Telephone Encounter (Signed)
Pt states she was increased to 1.2 of Victoza and the pharmacy will not refill because rx states 0.6. Please advise

## 2014-09-17 MED ORDER — LIRAGLUTIDE 18 MG/3ML ~~LOC~~ SOPN: 1.2000 mg | PEN_INJECTOR | Freq: Every day | SUBCUTANEOUS | Status: DC

## 2014-09-18 ENCOUNTER — Other Ambulatory Visit: Payer: 59

## 2014-09-18 ENCOUNTER — Ambulatory Visit (INDEPENDENT_AMBULATORY_CARE_PROVIDER_SITE_OTHER): Payer: Medicare Other | Admitting: Cardiology

## 2014-09-18 ENCOUNTER — Encounter: Payer: Self-pay | Admitting: Cardiology

## 2014-09-18 VITALS — BP 120/78 | HR 77

## 2014-09-18 DIAGNOSIS — I2583 Coronary atherosclerosis due to lipid rich plaque: Principal | ICD-10-CM

## 2014-09-18 DIAGNOSIS — G459 Transient cerebral ischemic attack, unspecified: Secondary | ICD-10-CM

## 2014-09-18 DIAGNOSIS — I2510 Atherosclerotic heart disease of native coronary artery without angina pectoris: Secondary | ICD-10-CM

## 2014-09-18 DIAGNOSIS — I10 Essential (primary) hypertension: Secondary | ICD-10-CM

## 2014-09-18 NOTE — Progress Notes (Signed)
[facility]. [address], [place]  [address] Phone: [phone] Fax:  [phone]  Date:  09/18/2014   ID:  Morgan [name], DOB 1940/12/05, MRN [id number]  PCP:  Eliezer Lofts, MD   History of Present Illness: Morgan [name] is a 73 y.o. female with coronary artery disease status post bypass surgery in 2006, diagonal stent, DM here for followup.   Had episode of angioedema (testing for allergies has been done - negative except for cockroach). in April 2014, I stopped her losartan because of the small chance of cross reactivity/angioedema. Her blood pressure today was excellent. Cough has improved. ENT [facility]).   In review of her coronary disease, in April of 2012 she had cardiac catheterization with drug-eluting stent the first diagonal branch that had previously been determined to small for bypass in 2006. She completed cardiac rehabilitation. Approximately a year later she was having chest discomfort moderate in intensity but mostly leg pain/rheumatologic complaints. She underwent nuclear stress test on 02/05/12 which was low risk showing only possible subtle ischemia in the distal anterolateral segment. Normal ejection fraction.   Hyperlipidemia-last LDL 58 on 8/14. Excellent. Blood pressure also under good control.   Glucose mildly elevated. Hemoglobin A1C was 8.4.  Knee replacement.   Feeling mild SOB, HA. Reminds her slightly of the way she felt prior to her diagonal, small caliber PCI.  Post knee.   09/18/14 - Had cerebellar TIA. Nausea, flashes in front of eye. Working on diabetes. Metformin, diarrhea. Hemoglobin A1c 8.2. Excellent LDL 59. Mild bilateral carotid plaque.   Wt Readings from Last 3 Encounters:  07/23/14 186 lb 8 oz (84.596 kg)  05/19/14 184 lb 8 oz (83.689 kg)  04/29/14 181 lb (82.101 kg)     Past Medical History  Diagnosis Date  . Hypertension   . High cholesterol   . Hyperlipidemia   . Angina   . Migraines     "til ~ 1980"  .  Headache(784.0)     "recurring"  . Restless leg syndrome   . Weakness of right side of body     "I've had PT for it; they don't know what it's from".  CORTISONE INJECTION INTO BACK 08/30/12  . Diabetes mellitus     diet controlled  . Fibromyalgia   . Sciatic nerve pain     "from pinched nerve"  . Depression   . Atrial fibrillation     ASPIRIN FOR BLOOD THINNER  . Coronary artery disease   . GERD (gastroesophageal reflux disease)   . H/O hiatal hernia   . Complication of anesthesia     pt states has choking sensation with ET tube   . PONV (postoperative nausea and vomiting)   . Sleep apnea   . Arthritis   . Bulging discs     lumbar     Past Surgical History  Procedure Laterality Date  . Coronary artery bypass graft  2005    CABG X 2  . Coronary angioplasty with stent placement  06/2011    "1"  . Nasal septum surgery  ~ 1986  . 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 Dilation and curettage of uterus      "more than once"  . Knee arthroscopy  09/04/2012    Procedure: ARTHROSCOPY KNEE;  Surgeon: Gearlean Alf, MD;  Location: [facility];  Service: Orthopedics;  Laterality: Right;  right knee arthroscopy with medial  and lateral meniscus debridement  . Fracture surgery  ~ 2005    nose  . Total knee arthroplasty Right 07/07/2013    Procedure: RIGHT TOTAL KNEE ARTHROPLASTY;  Surgeon: Gearlean Alf, MD;  Location: [facility];  Service: Orthopedics;  Laterality: Right;    Current Outpatient Prescriptions  Medication Sig Dispense Refill  . aspirin 325 MG tablet Take 325 mg by mouth daily.    [name] atorvastatin (LIPITOR) 80 MG tablet TAKE 1 TABLET EVERY DAY 90 tablet 1  . cetirizine (ZYRTEC) 10 MG tablet Take 10 mg by mouth daily.    . clonazePAM (KLONOPIN) 1 MG tablet TAKE 2 TABLETS BY MOUTH AT BEDTIME AS NEEDED 60 tablet 0  . clopidogrel (PLAVIX) 75 MG tablet Take 75 mg by mouth daily.    [name] EPINEPHrine (EPI-PEN) 0.3 mg/0.3 mL SOAJ  injection Inject into the muscle once as needed.    . gabapentin (NEURONTIN) 300 MG capsule Take 600 mg by mouth at bedtime.    . hydrochlorothiazide (HYDRODIURIL) 25 MG tablet TAKE 1 TABLET BY MOUTH DAILY 30 tablet 5  . Liraglutide (VICTOZA) 18 MG/3ML SOPN Inject into the skin.    [name] metformin (FORTAMET) 1000 MG (OSM) 24 hr tablet TAKE 1 TABLET BY MOUTH EVERY DAY AS DIRECTED . IN COMBO WITH XR 500 30 tablet 5  . metoprolol succinate (TOPROL-XL) 100 MG 24 hr tablet TAKE 1 TABLET BY MOUTH ONCE DAILY 30 tablet 5  . Multiple Vitamins-Minerals (PRESERVISION AREDS PO) Take 1 tablet by mouth 2 (two) times daily.     . nitroGLYCERIN (NITROSTAT) 0.4 MG SL tablet Place 0.4 mg under the tongue every 5 (five) minutes as needed. For chest pain    . ONE TOUCH ULTRA TEST test strip Used to test blood sugar once daily dx: 250.00    . ONETOUCH DELICA LANCETS 53G MISC USED TO TEST BLOOD SUGAR ONCE DAILY DX: 250.00 100 each 3  . pantoprazole (PROTONIX) 20 MG tablet TAKE 2 TABLETS BY MOUTH TWICE A DAY 360 tablet 1  . venlafaxine XR (EFFEXOR-XR) 37.5 MG 24 hr capsule TAKE 1 CAPSULE TWICE DAILY 60 capsule 5   No current facility-administered medications for this visit.    Allergies:    Allergies  Allergen Reactions  . Percocet [Oxycodone-Acetaminophen] Other (See Comments)    hallucination  . Sulfa Antibiotics Other (See Comments)    hallucinations    Social History:  The patient  reports that she has never smoked. She has never used smokeless tobacco. She reports that she drinks about 1.2 oz of alcohol per week. She reports that she does not use illicit drugs.   ROS:  Please see the history of present illness.   Denies any fevers, chills, orthopnea, PND, syncope    PHYSICAL EXAM: VS:  BP 120/78 mmHg  Pulse 77 Well nourished, well developed, in no acute distress HEENT: normal Neck: no JVD Cardiac:  normal S1, S2; RRR; no murmurPrior bypass scar well healed Lungs:  clear to auscultation bilaterally, no  wheezing, rhonchi or rales Abd: soft, nontender, no hepatomegaly Ext: no edema. Knee right scar healing, effusion noted . Skin: warm and dry Neuro: no focal abnormalities noted  EKG:  03/23/14-sinus rhythm, 63, nonspecific ST flattening. Overall normal.   ASSESSMENT AND PLAN:  1. Coronary artery disease-status post bypass/ DES placement. Overall doing well. Monitor her symptoms. Continue beta blocker. Off Plavix. 2. TIA-cerebellar. Nausea, vestibular symptoms. Eye Dr. Diagnosis. No evidence of AFIB. 3. S/p knee replacement 07/07/13 - Dr. Wynelle Link. Still  taking pain medication for this. PT. Still having trouble with complete extension.  4. Hyperlipidemia-excellent lipids. Reviewed labs. Continue with atorvastatin. Dr. Diona Browner can continue to check . 5. HTN-mildly elevated. Continue to monitor. Her diastolic is quite low. We will continue with current medications. 6. Diabetes-metformin. Glucose reviewed. She is quite pleased. A1c 8.2 7. Obesity-encourage weight loss once knee pain/physical therapy is improving. Exercise.  8. We will see back in 6 months.  Signed, Candee Furbish, MD [facility]  09/18/2014 3:17 PM

## 2014-09-18 NOTE — Patient Instructions (Signed)
The current medical regimen is effective;  continue present plan and medications.  Follow up in 6 months with Dr. Skains.  You will receive a letter in the mail 2 months before you are due.  Please call us when you receive this letter to schedule your follow up appointment.  

## 2014-09-21 ENCOUNTER — Other Ambulatory Visit: Payer: Self-pay | Admitting: Family Medicine

## 2014-09-21 NOTE — Telephone Encounter (Signed)
Last office visit 07/23/2014.  Last refilled 08/10/2014 for #60 with no refills.  Ok to refill?

## 2014-09-22 ENCOUNTER — Ambulatory Visit: Payer: 59 | Admitting: Podiatry

## 2014-09-22 ENCOUNTER — Ambulatory Visit (INDEPENDENT_AMBULATORY_CARE_PROVIDER_SITE_OTHER): Payer: 59 | Admitting: Podiatry

## 2014-09-22 VITALS — BP 144/71 | HR 74 | Resp 16

## 2014-09-22 DIAGNOSIS — M79676 Pain in unspecified toe(s): Secondary | ICD-10-CM

## 2014-09-22 DIAGNOSIS — L600 Ingrowing nail: Secondary | ICD-10-CM

## 2014-09-22 NOTE — Telephone Encounter (Signed)
Called to [facility] [address]

## 2014-09-22 NOTE — Patient Instructions (Signed)

## 2014-09-23 NOTE — Progress Notes (Signed)
Patient ID: Morgan [name], female   DOB: 04-Dec-1940, 73 y.o.   MRN: [id number]  Subjective: 73 year old female presents the office today for diabetic risk assessment and for painful elongated nails. She also states that she has painful ingrown toenails to bilateral big toes. She previously has had partial nail removal with chemical matricectomy but since states that the nails have grown back and she has tenderness. She denies any recent redness or drainage from around the areas. She is diabetic and she states her blood sugars typically runs 140. She denies any history of ulceration or any claudication symptoms. No other complaints at this time.  Objective: AAO 3, NAD DP/PT pulses palpable bilaterally, CRT less than 3 seconds Protective sensation intact with Simms Weinstein monofilament Nails hypertrophic, dystrophic, elongated, discolored. No surrounding erythema or drainage.  On bilateral hallux nails there is tenderness to palpation over the lateral nail border bilaterally. there is evidence of ingrowing on both lateral borders of the hallux. There is no surrounding erythema, drainage, ascending cellulitis. No open lesions. No calf pain, swelling, warmth, erythema MMT 5/5, ROM WNL  Assessment: 73 year old female presents for diabetic risk assessment, elongated toenails and symptomatic recurrent ingrown toenails bilateral lateral hallux nail border without clinical signs of infection  Plan: -Treatment options discussed including alternatives, risks, complications. -Nail sharply debrided 10 without occasions. -After debridement of the bilateral hallux nail border she continues to have pain mostly along the lateral nail border and along the proximal lateral nail border. At this time she is requesting partial nail avulsions with chemical matricectomy to help prevent recurrent ingrown toenails due to the pain. Risks and complications of the procedure were discussed with the patient for which she  verbally understood. Under sterile conditions a total of 2.5 mL of a one-to-one mixture of 2% lidocaine plain and 0.5% Marcaine plain was infiltrated in a hallux block fashion bilaterally. Bilateral hallux were then prepped in sterile fashion and a tourniquet was applied. Lateral borders of both hallux nails were then sharply excised the surgery and the entire offending nail border. There is noted to be ingrowing noted along the proximal lateral nail border. There is no purulence or other clinical signs of infection noted. After the area was debrided phenol was applied under standard conditions and then irrigated. Betadine ointment was applied followed by dry sterile dressing. After application of the dressing tourniquet was removed and there is noted to be an immediate capillary refill time noted to both digits. Patient tolerated the procedure well without any complications. -Amoxicillin -Discussed post procedure care for which she verbally understood. -Monitoring clinical signs or symptoms of infection and directed to call the office immediately if any are to occur or go to the emergency room. -Follow-up in one week. In the meantime call the office with any questions, concerns, change in symptoms.

## 2014-09-29 ENCOUNTER — Ambulatory Visit (INDEPENDENT_AMBULATORY_CARE_PROVIDER_SITE_OTHER): Payer: 59 | Admitting: Podiatry

## 2014-09-29 ENCOUNTER — Ambulatory Visit (AMBULATORY_SURGERY_CENTER): Payer: Self-pay

## 2014-09-29 VITALS — BP 124/70 | HR 77 | Resp 16

## 2014-09-29 VITALS — Ht 63.0 in | Wt 180.6 lb

## 2014-09-29 DIAGNOSIS — Z86010 Personal history of colonic polyps: Secondary | ICD-10-CM

## 2014-09-29 DIAGNOSIS — Z800 Family history of malignant neoplasm of digestive organs: Secondary | ICD-10-CM

## 2014-09-29 DIAGNOSIS — L600 Ingrowing nail: Secondary | ICD-10-CM

## 2014-09-29 MED ORDER — NA SULFATE-K SULFATE-MG SULF 17.5-3.13-1.6 GM/177ML PO SOLN: ORAL | Status: DC

## 2014-09-29 NOTE — Progress Notes (Signed)
Patient ID: Morgan [name], female   DOB: Aug 11, 1941, 73 y.o.   MRN: [id number]  Subjective: 73 year old female returns the office today one-week status post bilateral lateral hallux nail border partial nail avulsions due to pain. She states that she's been continuing to soak his feet twice a day followed by antibiotic ointment and a Band-Aid. She states that they are both sore with the left worse than the right. She states that she is taking over-the-counter Aleve for the discomfort which helps. She denies any systemic complaints such as fevers, chills, nausea, vomiting. No acute changes since last appointment. No other complaints at this time.  Objective: AAO x3, NAD DP/PT pulses palpable bilaterally, CRT less than 3 seconds Protective sensation intact with Simms Weinstein monofilament, vibratory sensation intact, Achilles tendon reflex intact On the lateral aspect of the hallux nail border status post partial nail avulsion there is evidence of healing. There is mild tenderness to palpation over both the nail borders of the left greater than right. There is no surrounding erythema or ascending cellulitis. Mild amount of serous drainage no areas of fluctuance or crepitus. No calf pain, swelling, warmth, erythema.  Assessment: 73 year old female 1 week status post bilateral lateral hallux partial nail avulsions with chemical matricectomy for chronic reoccurring painful ingrown toenail.  Plan: -Treatment options were discussed including alternatives, risks, competitions. -At this time recommend continue with Epson salt soaks twice a day followed by antibiotic ointment and a Band-Aid. Can leave uncovered at night. Continue this until completely healed. At this time the patient does not when any prescription medication for pain. -Monitor for any clinical signs or symptoms of infection and directed to call the office immediately if any are to occur or go to the emergency room. -Follow-up in 2 weeks. In  the meantime, call the office with any questions, concerns, change in symptoms.

## 2014-09-29 NOTE — Progress Notes (Signed)
Per pt, no allergies to soy or egg products.Pt not taking any weight loss meds or using  O2 at home.  Pt states she has post-op nausea and vomiting and some light-headedness past sedation.

## 2014-10-01 ENCOUNTER — Telehealth: Payer: Self-pay

## 2014-10-01 NOTE — Telephone Encounter (Signed)
Left message to call back regarding scheduling an overdue office visit per Dr. Radford Pax.

## 2014-10-05 NOTE — Telephone Encounter (Signed)
Appointment confirmed for January 5 at 11:00.

## 2014-10-05 NOTE — Telephone Encounter (Signed)
F/u ° ° °Pt returning your call °

## 2014-10-12 ENCOUNTER — Telehealth: Payer: Self-pay | Admitting: Gastroenterology

## 2014-10-12 NOTE — Telephone Encounter (Signed)
Returned patient's call and she was wondering if she would have to take an antibiotic prior to her colonoscopy.  I advised her that she does not have to prior to colonoscopy.  She said that her orthopaedist advised her to and I reassured her that she doesn;t.  All questions were answered.

## 2014-10-13 ENCOUNTER — Encounter: Payer: Self-pay | Admitting: Gastroenterology

## 2014-10-13 ENCOUNTER — Ambulatory Visit: Payer: 59 | Admitting: Podiatry

## 2014-10-13 ENCOUNTER — Other Ambulatory Visit: Payer: Self-pay | Admitting: Family Medicine

## 2014-10-13 ENCOUNTER — Ambulatory Visit (AMBULATORY_SURGERY_CENTER): Payer: Medicare Other | Admitting: Gastroenterology

## 2014-10-13 VITALS — BP 152/88 | HR 76 | Temp 97.0°F | Resp 15 | Ht 63.0 in | Wt 180.0 lb

## 2014-10-13 DIAGNOSIS — D120 Benign neoplasm of cecum: Secondary | ICD-10-CM

## 2014-10-13 DIAGNOSIS — D123 Benign neoplasm of transverse colon: Secondary | ICD-10-CM

## 2014-10-13 DIAGNOSIS — Z86010 Personal history of colonic polyps: Secondary | ICD-10-CM

## 2014-10-13 DIAGNOSIS — Z1211 Encounter for screening for malignant neoplasm of colon: Secondary | ICD-10-CM

## 2014-10-13 DIAGNOSIS — Z800 Family history of malignant neoplasm of digestive organs: Secondary | ICD-10-CM

## 2014-10-13 DIAGNOSIS — K5730 Diverticulosis of large intestine without perforation or abscess without bleeding: Secondary | ICD-10-CM

## 2014-10-13 LAB — GLUCOSE, CAPILLARY
Glucose-Capillary: 121 mg/dL — ABNORMAL HIGH (ref 70–99)
Glucose-Capillary: 111 mg/dL — ABNORMAL HIGH (ref 70–99)

## 2014-10-13 MED ORDER — SODIUM CHLORIDE 0.9 % IV SOLN: 500.0000 mL | INTRAVENOUS | Status: DC

## 2014-10-13 NOTE — Op Note (Signed)
[facility]  [address] [place], [id number]   COLONOSCOPY PROCEDURE REPORT  PATIENT: Morgan, [name]  MR#: [id number] BIRTHDATE: 1941/01/22 , 73  yrs. old GENDER: female ENDOSCOPIST: Inda Castle, MD REFERRED BY:Amy Cletis Athens, M.D. PROCEDURE DATE:  10/13/2014 PROCEDURE:   Colonoscopy with cold biopsy polypectomy and Colonoscopy with snare polypectomy First Screening Colonoscopy - Avg.  risk and is 50 yrs.  old or older - No.  Prior Negative Screening - Now for repeat screening. Above average risk  History of Adenoma - Now for follow-up colonoscopy & has been > or = to 3 yrs.  N/A  Polyps Removed Today? Yes. ASA CLASS:   Class II INDICATIONS:patient's immediate family history of colon cancer. MEDICATIONS: Monitored anesthesia care and Propofol 200 mg IV  DESCRIPTION OF PROCEDURE:   After the risks benefits and alternatives of the procedure were thoroughly explained, informed consent was obtained.  The digital rectal exam revealed no abnormalities of the rectum.   The LB [id number]  endoscope was introduced through the anus and advanced to the cecum, which was identified by both the appendix and ileocecal valve. No adverse events experienced.   The quality of the prep was excellent using Suprep  The instrument was then slowly withdrawn as the colon was fully examined.      COLON FINDINGS: A sessile polyp measuring 2 mm in size was found in the distal transverse colon.  A polypectomy was performed with cold forceps.   A sessile polyp measuring 5 mm in size was found at the cecum.  A polypectomy was performed with a cold snare.  The resection was complete, the polyp tissue was completely retrieved and sent to histology.   There was mild diverticulosis noted in the descending colon.  Retroflexed views revealed no abnormalities. The time to cecum=6 minutes 44 seconds.  Withdrawal time=8 minutes 39 seconds.  The scope was withdrawn and the procedure  completed. COMPLICATIONS: There were no immediate complications.  ENDOSCOPIC IMPRESSION: 1.   Sessile polyp was found in the distal transverse colon; polypectomy was performed with cold forceps 2.   Sessile polyp was found at the cecum; polypectomy was performed with a cold snare 3.   There was mild diverticulosis noted in the descending colon  RECOMMENDATIONS: Given your significant family history of colon cancer, you should have a repeat colonoscopy in 5 years  eSigned:  Inda Castle, MD 10/13/2014 11:19 AM   cc:   PATIENT NAME:  Morgan, [name] MR#: [id number]

## 2014-10-13 NOTE — Progress Notes (Signed)
Pt awake and alert, vss, pleased with MAC. Report to RN 

## 2014-10-13 NOTE — Patient Instructions (Signed)
YOU HAD AN ENDOSCOPIC PROCEDURE TODAY AT THE [facility]: Refer to the procedure report that was given to you for any specific questions about what was found during the examination.  If the procedure report does not answer your questions, please call your gastroenterologist to clarify.  If you requested that your care partner not be given the details of your procedure findings, then the procedure report has been included in a sealed envelope for you to review at your convenience later.  YOU SHOULD EXPECT: Some feelings of bloating in the abdomen. Passage of more gas than usual.  Walking can help get rid of the air that was put into your GI tract during the procedure and reduce the bloating. If you had a lower endoscopy (such as a colonoscopy or flexible sigmoidoscopy) you may notice spotting of blood in your stool or on the toilet paper. If you underwent a bowel prep for your procedure, then you may not have a normal bowel movement for a few days.  DIET: Your first meal following the procedure should be a light meal and then it is ok to progress to your normal diet.  A half-sandwich or bowl of soup is an example of a good first meal.  Heavy or fried foods are harder to digest and may make you feel nauseous or bloated.  Likewise meals heavy in dairy and vegetables can cause extra gas to form and this can also increase the bloating.  Drink plenty of fluids but you should avoid alcoholic beverages for 24 hours.  ACTIVITY: Your care partner should take you home directly after the procedure.  You should plan to take it easy, moving slowly for the rest of the day.  You can resume normal activity the day after the procedure however you should NOT DRIVE or use heavy machinery for 24 hours (because of the sedation medicines used during the test).    SYMPTOMS TO REPORT IMMEDIATELY: A gastroenterologist can be reached at any hour.  During normal business hours, 8:30 AM to 5:00 PM Monday through Friday,  call [phone].  After hours and on weekends, please call the GI answering service at [phone] who will take a message and have the physician on call contact you.   Following lower endoscopy (colonoscopy or flexible sigmoidoscopy):  Excessive amounts of blood in the stool  Significant tenderness or worsening of abdominal pains  Swelling of the abdomen that is new, acute  Fever of 100F or higher FOLLOW UP: If any biopsies were taken you will be contacted by phone or by letter within the next 1-3 weeks.  Call your gastroenterologist if you have not heard about the biopsies in 3 weeks.  Our staff will call the home number listed on your records the next business day following your procedure to check on you and address any questions or concerns that you may have at that time regarding the information given to you following your procedure. This is a courtesy call and so if there is no answer at the home number and we have not heard from you through the emergency physician on call, we will assume that you have returned to your regular daily activities without incident.  SIGNATURES/CONFIDENTIALITY: You and/or your care partner have signed paperwork which will be entered into your electronic medical record.  These signatures attest to the fact that that the information above on your After Visit Summary has been reviewed and is understood.  Full responsibility of the confidentiality of this discharge   information lies with you and/or your care-partner.  Recommendations Next colonoscopy in 5 years. Diverticulosis, high fiber diet, and polyp handouts provided to patient/care partner.

## 2014-10-13 NOTE — Progress Notes (Signed)
Called to room to assist during endoscopic procedure.  Patient ID and intended procedure confirmed with present staff. Received instructions for my participation in the procedure from the performing physician.  

## 2014-10-14 ENCOUNTER — Telehealth: Payer: Self-pay | Admitting: *Deleted

## 2014-10-14 NOTE — Telephone Encounter (Signed)
Last office visit 07/23/2014.  Ok to refill?

## 2014-10-14 NOTE — Telephone Encounter (Signed)
  Follow up Call-  Call back number 10/13/2014  Post procedure Call Back phone  # [phone]  Permission to leave phone message Yes     Patient questions:  Do you have a fever, pain , or abdominal swelling? No. Pain Score  0 *  Have you tolerated food without any problems? Yes.    Have you been able to return to your normal activities? Yes.    Do you have any questions about your discharge instructions: Diet   No. Medications  No. Follow up visit  No.  Do you have questions or concerns about your Care? No.  Actions: * If pain score is 4 or above: No action needed, pain <4.

## 2014-10-15 ENCOUNTER — Ambulatory Visit (INDEPENDENT_AMBULATORY_CARE_PROVIDER_SITE_OTHER): Payer: 59 | Admitting: Podiatry

## 2014-10-15 DIAGNOSIS — M79676 Pain in unspecified toe(s): Secondary | ICD-10-CM

## 2014-10-15 DIAGNOSIS — B351 Tinea unguium: Secondary | ICD-10-CM

## 2014-10-15 DIAGNOSIS — L600 Ingrowing nail: Secondary | ICD-10-CM

## 2014-10-15 NOTE — Patient Instructions (Signed)
Diabetes and Foot Care Diabetes may cause you to have problems because of poor blood supply (circulation) to your feet and legs. This may cause the skin on your feet to become thinner, break easier, and heal more slowly. Your skin may become dry, and the skin may peel and crack. You may also have nerve damage in your legs and feet causing decreased feeling in them. You may not notice minor injuries to your feet that could lead to infections or more serious problems. Taking care of your feet is one of the most important things you can do for yourself.  HOME CARE INSTRUCTIONS  Wear shoes at all times, even in the house. Do not go barefoot. Bare feet are easily injured.  Check your feet daily for blisters, cuts, and redness. If you cannot see the bottom of your feet, use a mirror or ask someone for help.  Wash your feet with warm water (do not use hot water) and mild soap. Then pat your feet and the areas between your toes until they are completely dry. Do not soak your feet as this can dry your skin.  Apply a moisturizing lotion or petroleum jelly (that does not contain alcohol and is unscented) to the skin on your feet and to dry, brittle toenails. Do not apply lotion between your toes.  Trim your toenails straight across. Do not dig under them or around the cuticle. File the edges of your nails with an emery board or nail file.  Do not cut corns or calluses or try to remove them with medicine.  Wear clean socks or stockings every day. Make sure they are not too tight. Do not wear knee-high stockings since they may decrease blood flow to your legs.  Wear shoes that fit properly and have enough cushioning. To break in new shoes, wear them for just a few hours a day. This prevents you from injuring your feet. Always look in your shoes before you put them on to be sure there are no objects inside.  Do not cross your legs. This may decrease the blood flow to your feet.  If you find a minor scrape,  cut, or break in the skin on your feet, keep it and the skin around it clean and dry. These areas may be cleansed with mild soap and water. Do not cleanse the area with peroxide, alcohol, or iodine.  When you remove an adhesive bandage, be sure not to damage the skin around it.  If you have a wound, look at it several times a day to make sure it is healing.  Do not use heating pads or hot water bottles. They may burn your skin. If you have lost feeling in your feet or legs, you may not know it is happening until it is too late.  Make sure your health care provider performs a complete foot exam at least annually or more often if you have foot problems. Report any cuts, sores, or bruises to your health care provider immediately. SEEK MEDICAL CARE IF:   You have an injury that is not healing.  You have cuts or breaks in the skin.  You have an ingrown nail.  You notice redness on your legs or feet.  You feel burning or tingling in your legs or feet.  You have pain or cramps in your legs and feet.  Your legs or feet are numb.  Your feet always feel cold. SEEK IMMEDIATE MEDICAL CARE IF:   There is increasing redness,   swelling, or pain in or around a wound.  There is a red line that goes up your leg.  Pus is coming from a wound.  You develop a fever or as directed by your health care provider.  You notice a bad smell coming from an ulcer or wound. Document Released: 10/27/2000 Document Revised: 07/02/2013 Document Reviewed: 04/08/2013 ExitCare Patient Information 2015 [employer]. This information is not intended to replace advice given to you by your health care provider. Make sure you discuss any questions you have with your health care provider.  

## 2014-10-15 NOTE — Progress Notes (Signed)
Patient ID: Morgan [name], female   DOB: 05/22/1941, 73 y.o.   MRN: [id number]  Subjective: 73 year old female returns the office a fall evaluation status post bilateral lateral hallux partial nail avulsions with chemical matricectomy due to recurrent painful ingrown toenail. She states that since last appointment her pain is improved. She does that she has mild discomfort along the procedure site although much improved. She denies any redness or drainage from the nail sites. No purulence identified. No streaking. Denies any systemic complaints as fevers, chills, nausea, vomiting. Patient also states that her nails are painful and elongated and aspirin for nail debridement as it is difficult for her to trim. She denies any redness or drainage from the nail sites. No other complaints at this time. Last blood sugar was 130. Denies any recent open lesions other than the nail avulsion sites, no claudication symptoms, and tingling/numbness.   Objective: AAO x3, NAD DP/PT pulses palpable bilaterally, CRT less than 3 seconds Protective sensation intact with Simms Weinstein monofilament, vibratory sensation intact, Achilles tendon reflex intact Bilateral hallux nail status post lateral partial nail avulsion which are healed at this time. There is small amount of hyperkeratotic tissue within the nail borders. No swelling erythema, edema, drainage, ascending cellulitis, purulence. No signs of infection.  Nails dystrophic, discolored, elongated, brittle 10. Mild incurvation of the nail borders on the lesser digit nails. No swelling erythema or drainage.  No pre-ulcerative lesions.  MMT 5/5, ROM WNL No pain with calf compression, swelling, warmth, erythema.   Assessment : 73 year old female presents for follow-up evaluation of b/l lateral hallux partial nail avulsions and for symptomatic onychomycosis.   Plan : -Treatment options were discussed including alternatives, risks, complications.  -Nail sharply  debrided 10 without complications.  -Discussed with the patient that she can continue to cover the site of the Band-Aid if needed from the partial nail avulsions. Also she can wash the area with antibiotic soap and some of the callus will start a fall off. Hyperkeratotic tissue was also sharply debrided at today's appointment. Underlying skin intact. Once some this tissue resolves should help alleviate some of the symptoms in the proximal aspect of the nail. If symptoms do not resolve within the next couple weeks patient directed to call the office for follow-up appointment. Also monitor for any signs or symptoms of infection and directed to call the office if any are to occur.  -Follow-up in 3 months or sooner if any problems are to arise. In the meantime, call the office with any questions, concerns, change in symptoms.

## 2014-10-18 ENCOUNTER — Other Ambulatory Visit: Payer: Self-pay | Admitting: Family Medicine

## 2014-10-20 ENCOUNTER — Other Ambulatory Visit (INDEPENDENT_AMBULATORY_CARE_PROVIDER_SITE_OTHER): Payer: Medicare Other

## 2014-10-20 ENCOUNTER — Encounter: Payer: Self-pay | Admitting: Gastroenterology

## 2014-10-20 ENCOUNTER — Telehealth: Payer: Self-pay | Admitting: Family Medicine

## 2014-10-20 DIAGNOSIS — E119 Type 2 diabetes mellitus without complications: Secondary | ICD-10-CM

## 2014-10-20 LAB — COMPREHENSIVE METABOLIC PANEL
ALBUMIN: 3.7 g/dL (ref 3.5–5.2)
ALT: 32 U/L (ref 0–35)
AST: 21 U/L (ref 0–37)
Alkaline Phosphatase: 86 U/L (ref 39–117)
BILIRUBIN TOTAL: 0.7 mg/dL (ref 0.2–1.2)
BUN: 21 mg/dL (ref 6–23)
CALCIUM: 9.7 mg/dL (ref 8.4–10.5)
CHLORIDE: 101 meq/L (ref 96–112)
CO2: 32 mEq/L (ref 19–32)
Creatinine, Ser: 0.9 mg/dL (ref 0.4–1.2)
GFR: 64.48 mL/min (ref >60.00)
GLUCOSE: 124 mg/dL — AB (ref 70–99)
Potassium: 4 mEq/L (ref 3.5–5.1)
Sodium: 141 mEq/L (ref 135–145)
TOTAL PROTEIN: 6.6 g/dL (ref 6.0–8.3)

## 2014-10-20 LAB — LIPID PANEL
CHOL/HDL RATIO: 3
Cholesterol: 126 mg/dL (ref 0–200)
HDL: 43.7 mg/dL (ref >39.00)
LDL Cholesterol: 64 mg/dL (ref 0–99)
NonHDL: 82.3
Triglycerides: 90 mg/dL (ref 0.0–149.0)
VLDL: 18 mg/dL (ref 0.0–40.0)

## 2014-10-20 LAB — HEMOGLOBIN A1C: HEMOGLOBIN A1C: 7.3 % — AB (ref 4.6–6.5)

## 2014-10-20 NOTE — Telephone Encounter (Signed)
-----   Message from [place]rens sent at 10/13/2014  9:43 AM EST ----- Regarding: 6 mo dm f/u labs Tues 10/20/14, need orders. Please order  Future 6 mo dm f/u labs for pt's upcoming lab appt. Thanks Aniceto Boss

## 2014-10-26 ENCOUNTER — Other Ambulatory Visit: Payer: Self-pay | Admitting: Family Medicine

## 2014-10-27 ENCOUNTER — Other Ambulatory Visit: Payer: Self-pay | Admitting: Cardiology

## 2014-10-27 ENCOUNTER — Ambulatory Visit (INDEPENDENT_AMBULATORY_CARE_PROVIDER_SITE_OTHER): Payer: Medicare Other | Admitting: Family Medicine

## 2014-10-27 ENCOUNTER — Encounter: Payer: Self-pay | Admitting: Family Medicine

## 2014-10-27 VITALS — BP 100/58 | HR 75 | Temp 97.9°F | Ht 63.0 in | Wt 180.0 lb

## 2014-10-27 DIAGNOSIS — I10 Essential (primary) hypertension: Secondary | ICD-10-CM

## 2014-10-27 DIAGNOSIS — E7800 Pure hypercholesterolemia, unspecified: Secondary | ICD-10-CM

## 2014-10-27 DIAGNOSIS — E119 Type 2 diabetes mellitus without complications: Secondary | ICD-10-CM

## 2014-10-27 DIAGNOSIS — Z23 Encounter for immunization: Secondary | ICD-10-CM

## 2014-10-27 NOTE — Patient Instructions (Signed)
Start regular exercise routine, goal eventually should be 150 min /week. Continue to work on low carb diet. Schedule follow up  DM in 3 months, with labs prior.

## 2014-10-27 NOTE — Assessment & Plan Note (Signed)
Well controlled. Continue current medication.  

## 2014-10-27 NOTE — Progress Notes (Signed)
73 year old female presents for DM follow up.  Diabetes: Improved control on 1000 mg metformin daily and now victoza 1.2 . Lab Results  Component Value Date   HGBA1C 7.3* 10/20/2014  Using medications without difficulties: On this dose of victoza she has had worsening of diarrhea initially, but better now, occuring only once a month. Hypoglycemic episodes:None Hyperglycemic episodes:None Feet problems:None, thickened toenails Blood Sugars averaging: FBS 98-160, 2 hours meals: not checking eye exam within last year: yes She has not been exercising, trying to work on diet.   Wt Readings from Last 3 Encounters:  10/27/14 180 lb (81.647 kg)  10/13/14 180 lb (81.647 kg)  09/29/14 180 lb 9.6 oz (81.92 kg)     She is at goal LDL < 70 on lipitor 80 mg daily given CAD, TIA history.  Lab Results  Component Value Date   CHOL 126 10/20/2014   HDL 43.70 10/20/2014   LDLCALC 64 10/20/2014   TRIG 90.0 10/20/2014   CHOLHDL 3 10/20/2014  She was on plavix for 1 year after stent. Stopped due to easy bruising. No stomach bleeding, remote hx of ulcer 30 years ago.    GERD, moderate control on 20 mg  4 tabs dailyof pantoprazole. Increase in symptoms but insurance wants to decrease medication to 3 tabs daily. Hx of esophagitis on endoscopy in past, PUD.  needs prior auth.    Review of Systems  Constitutional: Negative for fever and fatigue.  HENT: Negative for ear pain.  Eyes: Negative for pain.  Respiratory: Negative for chest tightness and shortness of breath.  Cardiovascular: Negative for chest pain, palpitations and leg swelling.  Gastrointestinal: Negative for abdominal pain.  Genitourinary: Negative for dysuria.  Objective:   Physical Exam  Constitutional: She is oriented to person, place, and time. Vital signs are normal. She appears well-developed and well-nourished. She is cooperative. Non-toxic appearance. She does not appear ill. No distress.  HENT:  Head:  Normocephalic.  Right Ear: Hearing, tympanic membrane, external ear and ear canal normal. Tympanic membrane is not erythematous, not retracted and not bulging.  Left Ear: Hearing, tympanic membrane, external ear and ear canal normal. Tympanic membrane is not erythematous, not retracted and not bulging.  Nose: No mucosal edema or rhinorrhea. Right sinus exhibits no maxillary sinus tenderness and no frontal sinus tenderness. Left sinus exhibits no maxillary sinus tenderness and no frontal sinus tenderness.  Mouth/Throat: Uvula is midline, oropharynx is clear and moist and mucous membranes are normal.  Eyes: Conjunctivae, EOM and lids are normal. Pupils are equal, round, and reactive to light. Lids are everted and swept, no foreign bodies found.  Neck: Trachea normal and normal range of motion. Neck supple. Carotid bruit is not present. No mass and no thyromegaly present.  Cardiovascular: Normal rate, regular rhythm, S1 normal, S2 normal, normal heart sounds, intact distal pulses and normal pulses. Exam reveals no gallop and no friction rub.  No murmur heard.  Pulmonary/Chest: Effort normal and breath sounds normal. Not tachypneic. No respiratory distress. She has no decreased breath sounds. She has no wheezes. She has no rhonchi. She has no rales.  Abdominal: Soft. Normal appearance and bowel sounds are normal. There is no tenderness.  Neurological: She is alert and oriented to person, place, and time. She has normal strength and normal reflexes. No cranial nerve deficit or sensory deficit. Skin: Skin is warm, dry and intact. No rash noted.  Psychiatric: She has a normal mood and affect. Her speech is normal and behavior is  normal. Judgment and thought content normal. Her mood appears not anxious. Cognition and memory are normal.  Diabetic foot exam: Normal inspection No skin breakdown No calluses  Normal DP pulses Normal sensation to light touch and monofilament Nails thickened

## 2014-10-27 NOTE — Assessment & Plan Note (Signed)
Improving control, tolerating victoza and metformin with occ explosive diarrhea. SE are improving.  Work on lifestyle change and recheck in 3 months. No changes to doses.

## 2014-10-27 NOTE — Progress Notes (Signed)
Pre visit review using our clinic review tool, if applicable. No additional management support is needed unless otherwise documented below in the visit note. 

## 2014-10-29 ENCOUNTER — Encounter: Payer: Self-pay | Admitting: Family Medicine

## 2014-10-29 DIAGNOSIS — Z7689 Persons encountering health services in other specified circumstances: Secondary | ICD-10-CM

## 2014-10-31 ENCOUNTER — Other Ambulatory Visit: Payer: Self-pay | Admitting: Family Medicine

## 2014-10-31 NOTE — Telephone Encounter (Signed)
Last office visit 10/27/2014.  Last refilled 09/21/2014 for #60 with no refills.  Ok to refill?

## 2014-11-02 ENCOUNTER — Other Ambulatory Visit: Payer: Self-pay | Admitting: Family Medicine

## 2014-11-02 NOTE — Telephone Encounter (Signed)
Called to [facility] [address]

## 2014-11-05 ENCOUNTER — Other Ambulatory Visit: Payer: Self-pay | Admitting: Family Medicine

## 2014-11-17 ENCOUNTER — Ambulatory Visit: Payer: Medicare Other | Admitting: Cardiology

## 2014-11-17 ENCOUNTER — Telehealth: Payer: Self-pay | Admitting: *Deleted

## 2014-11-17 NOTE — Telephone Encounter (Signed)
Mrs. Ullmer brought in letter with her to her 10/27/2014 appointment with Dr. Diona Browner.  The letter was from her insurance about a new quantity limit on her pantoprazole 20 mg for 2016.  She is currently taking two tablets twice a day and the letter states in 2016 they will only cover 3 tablets daily.  Prior Authorization form quantity limit increase completed and faxed to [employer].  Request was denied.  Dr. Diona Browner appealed this decision on 10/29/2014.  Appeal was approved valid from 11/13/2014 through 11/13/2015.  VOPF#[id number] [id number].  Left message for Katharine Look that the appeal was approved for Pantoprazole 20 mg, 4 tablets per day.

## 2014-11-18 ENCOUNTER — Telehealth: Payer: Self-pay | Admitting: Family Medicine

## 2014-11-18 NOTE — Telephone Encounter (Signed)
Pt says she's had diarrhea and indigestion x3 days, she's taken Immodium which helps temporarily.  She's taking Victoza 18 mg .2units but wants to know if you want her to go back to .6units. Pt requests c/b. Thank you.

## 2014-11-18 NOTE — Telephone Encounter (Signed)
Yes, she can return to the lower dose of victoza to see if the GI SE resolve.

## 2014-11-19 NOTE — Telephone Encounter (Signed)
Morgan [name] notified to return to the lower victoza dose to see if her GI issues resolve.  Patient states understanding.

## 2014-11-19 NOTE — Telephone Encounter (Signed)
Left message for Shavell to return my call.

## 2014-11-26 ENCOUNTER — Other Ambulatory Visit: Payer: Self-pay | Admitting: *Deleted

## 2014-11-26 ENCOUNTER — Other Ambulatory Visit: Payer: Self-pay | Admitting: Family Medicine

## 2014-11-26 MED ORDER — METFORMIN HCL ER (OSM) 1000 MG PO TB24: ORAL_TABLET | ORAL | Status: DC

## 2014-11-29 ENCOUNTER — Other Ambulatory Visit: Payer: Self-pay | Admitting: Family Medicine

## 2014-12-07 ENCOUNTER — Other Ambulatory Visit: Payer: Self-pay | Admitting: Family Medicine

## 2014-12-07 NOTE — Telephone Encounter (Signed)
Last office visit 10/27/2014.  Last refilled 11/01/2014 for #60 with no refills.  Ok to refill?

## 2014-12-08 NOTE — Telephone Encounter (Signed)
Called to [facility] [address]

## 2014-12-13 NOTE — Progress Notes (Signed)
This encounter was created in error - please disregard.  This encounter was created in error - please disregard.

## 2014-12-14 ENCOUNTER — Encounter: Payer: Self-pay | Admitting: Cardiology

## 2014-12-16 ENCOUNTER — Telehealth: Payer: Self-pay

## 2014-12-16 NOTE — Telephone Encounter (Signed)
Chelsea with [employer] left /vm requesting cb; no additional message. Left v/m requesting cb.

## 2014-12-16 NOTE — Telephone Encounter (Signed)
Morgan [name] called back from [facility] wanting to get new order for glucose meter called [employer] this is a speaking meter. Pt will need test strips and lancet device and lancets. Please advise. 2 faxes have been sent for ref to this request.

## 2014-12-16 NOTE — Telephone Encounter (Signed)
Spoke with Mrs. Hai.  She states [facility] did call her in regards to a new meter but she told them that she was happy with the meter she already had.  Diabetic prescription form denied and faxed back to [facility].

## 2014-12-29 ENCOUNTER — Ambulatory Visit: Payer: Self-pay | Admitting: Cardiology

## 2014-12-31 ENCOUNTER — Ambulatory Visit (INDEPENDENT_AMBULATORY_CARE_PROVIDER_SITE_OTHER)
Admission: RE | Admit: 2014-12-31 | Discharge: 2014-12-31 | Disposition: A | Discharge status: Home / Self Care | Payer: Medicare Other | Source: Ambulatory Visit | Attending: Family Medicine | Admitting: Family Medicine

## 2014-12-31 ENCOUNTER — Encounter: Payer: Self-pay | Admitting: Family Medicine

## 2014-12-31 ENCOUNTER — Ambulatory Visit (INDEPENDENT_AMBULATORY_CARE_PROVIDER_SITE_OTHER): Payer: Medicare Other | Admitting: Family Medicine

## 2014-12-31 VITALS — BP 120/72 | HR 73 | Temp 97.7°F | Ht 63.0 in | Wt 178.5 lb

## 2014-12-31 DIAGNOSIS — S93402A Sprain of unspecified ligament of left ankle, initial encounter: Secondary | ICD-10-CM | POA: Insufficient documentation

## 2014-12-31 DIAGNOSIS — M79672 Pain in left foot: Secondary | ICD-10-CM

## 2014-12-31 DIAGNOSIS — W19XXXA Unspecified fall, initial encounter: Secondary | ICD-10-CM

## 2014-12-31 NOTE — Progress Notes (Signed)
   Subjective:    Patient ID: Morgan [name], female    DOB: 12-17-1940, 74 y.o.   MRN: [id number]  HPI  74 year old female pt with history of CAD, HTN, paroxsysmal afib, DM presents following fall 1 week ago.  She reports that she fell after lost circulation after compression on nerve from sitting.  Leg buckled under her. Twisted left  Ankle/foot medially. She had severe pain, swelling. Was not able to weight bear until next day.   She iced the area.  Now continued swelling, bruising and  and pain in left lateral ankle.  Swelling is some better but pain just as bad.  She has been using aleve, ice, elevation. She has not been bracing.   Went to [place].  Has history of osteopenia.    Review of Systems  Constitutional: Negative for fever and fatigue.  HENT: Negative for ear pain.   Eyes: Negative for pain.  Respiratory: Negative for chest tightness and shortness of breath.   Cardiovascular: Negative for chest pain, palpitations and leg swelling.  Gastrointestinal: Negative for abdominal pain.  Genitourinary: Negative for dysuria.       Objective:   Physical Exam        Assessment & Plan:

## 2014-12-31 NOTE — Progress Notes (Signed)
Pre visit review using our clinic review tool, if applicable. No additional management support is needed unless otherwise documented below in the visit note. 

## 2014-12-31 NOTE — Assessment & Plan Note (Signed)
AIr Cast, RICE NSAID for pain and swelling.  X-ray.  Re-eval in 2 weeks.

## 2014-12-31 NOTE — Assessment & Plan Note (Signed)
Eval with X-ray but likely due to swelling and walking differently

## 2014-12-31 NOTE — Patient Instructions (Signed)
ICE, elevation, ibuprofen for pain and swelling. Wear air cast when on feet. Start home PT exercises for strengthening as able. We will call with X-ray results.  Follow up in 2 weeks.  Acute Ankle Sprain with Phase I Rehab An acute ankle sprain is a partial or complete tear in one or more of the ligaments of the ankle due to traumatic injury. The severity of the injury depends on both the number of ligaments sprained and the grade of sprain. There are 3 grades of sprains.   A grade 1 sprain is a mild sprain. There is a slight pull without obvious tearing. There is no loss of strength, and the muscle and ligament are the correct length.  A grade 2 sprain is a moderate sprain. There is tearing of fibers within the substance of the ligament where it connects two bones or two cartilages. The length of the ligament is increased, and there is usually decreased strength.  A grade 3 sprain is a complete rupture of the ligament and is uncommon. In addition to the grade of sprain, there are three types of ankle sprains.  Lateral ankle sprains: This is a sprain of one or more of the three ligaments on the outer side (lateral) of the ankle. These are the most common sprains. Medial ankle sprains: There is one large triangular ligament of the inner side (medial) of the ankle that is susceptible to injury. Medial ankle sprains are less common. Syndesmosis, "high ankle," sprains: The syndesmosis is the ligament that connects the two bones of the lower leg. Syndesmosis sprains usually only occur with very severe ankle sprains. SYMPTOMS  Pain, tenderness, and swelling in the ankle, starting at the side of injury that may progress to the whole ankle and foot with time.  "Pop" or tearing sensation at the time of injury.  Bruising that may spread to the heel.  Impaired ability to walk soon after injury. CAUSES   Acute ankle sprains are caused by trauma placed on the ankle that temporarily forces or pries  the anklebone (talus) out of its normal socket.  Stretching or tearing of the ligaments that normally hold the joint in place (usually due to a twisting injury). RISK INCREASES WITH:  Previous ankle sprain.  Sports in which the foot may land awkwardly (i.e., basketball, volleyball, or soccer) or walking or running on uneven or rough surfaces.  Shoes with inadequate support to prevent sideways motion when stress occurs.  Poor strength and flexibility.  Poor balance skills.  Contact sports. PREVENTION   Warm up and stretch properly before activity.  Maintain physical fitness:  Ankle and leg flexibility, muscle strength, and endurance.  Cardiovascular fitness.  Balance training activities.  Use proper technique and have a coach correct improper technique.  Taping, protective strapping, bracing, or high-top tennis shoes may help prevent injury. Initially, tape is best; however, it loses most of its support function within 10 to 15 minutes.  Wear proper-fitted protective shoes (High-top shoes with taping or bracing is more effective than either alone).  Provide the ankle with support during sports and practice activities for 12 months following injury. PROGNOSIS   If treated properly, ankle sprains can be expected to recover completely; however, the length of recovery depends on the degree of injury.  A grade 1 sprain usually heals enough in 5 to 7 days to allow modified activity and requires an average of 6 weeks to heal completely.  A grade 2 sprain requires 6 to 10 weeks to heal  completely.  A grade 3 sprain requires 12 to 16 weeks to heal.  A syndesmosis sprain often takes more than 3 months to heal. RELATED COMPLICATIONS   Frequent recurrence of symptoms may result in a chronic problem. Appropriately addressing the problem the first time decreases the frequency of recurrence and optimizes healing time. Severity of the initial sprain does not predict the likelihood of  later instability.  Injury to other structures (bone, cartilage, or tendon).  A chronically unstable or arthritic ankle joint is a possibility with repeated sprains. TREATMENT Treatment initially involves the use of ice, medication, and compression bandages to help reduce pain and inflammation. Ankle sprains are usually immobilized in a walking cast or boot to allow for healing. Crutches may be recommended to reduce pressure on the injury. After immobilization, strengthening and stretching exercises may be necessary to regain strength and a full range of motion. Surgery is rarely needed to treat ankle sprains. MEDICATION   Nonsteroidal anti-inflammatory medications, such as aspirin and ibuprofen (do not take for the first 3 days after injury or within 7 days before surgery), or other minor pain relievers, such as acetaminophen, are often recommended. Take these as directed by your caregiver. Contact your caregiver immediately if any bleeding, stomach upset, or signs of an allergic reaction occur from these medications.  Ointments applied to the skin may be helpful.  Pain relievers may be prescribed as necessary by your caregiver. Do not take prescription pain medication for longer than 4 to 7 days. Use only as directed and only as much as you need. HEAT AND COLD  Cold treatment (icing) is used to relieve pain and reduce inflammation for acute and chronic cases. Cold should be applied for 10 to 15 minutes every 2 to 3 hours for inflammation and pain and immediately after any activity that aggravates your symptoms. Use ice packs or an ice massage.  Heat treatment may be used before performing stretching and strengthening activities prescribed by your caregiver. Use a heat pack or a warm soak. SEEK IMMEDIATE MEDICAL CARE IF:   Pain, swelling, or bruising worsens despite treatment.  You experience pain, numbness, discoloration, or coldness in the foot or toes.  New, unexplained symptoms develop  (drugs used in treatment may produce side effects.) EXERCISES  PHASE I EXERCISES RANGE OF MOTION (ROM) AND STRETCHING EXERCISES - Ankle Sprain, Acute Phase I, Weeks 1 to 2 These exercises may help you when beginning to restore flexibility in your ankle. You will likely work on these exercises for the 1 to 2 weeks after your injury. Once your physician, physical therapist, or athletic trainer sees adequate progress, he or she will advance your exercises. While completing these exercises, remember:   Restoring tissue flexibility helps normal motion to return to the joints. This allows healthier, less painful movement and activity.  An effective stretch should be held for at least 30 seconds.  A stretch should never be painful. You should only feel a gentle lengthening or release in the stretched tissue. RANGE OF MOTION - Dorsi/Plantar Flexion  While sitting with your right / left knee straight, draw the top of your foot upwards by flexing your ankle. Then reverse the motion, pointing your toes downward.  Hold each position for ____15______ seconds.  After completing your first set of exercises, repeat this exercise with your knee bent. Repeat ______3____ times. Complete this exercise ____2-3______ times per day.  RANGE OF MOTION - Ankle Alphabet  Imagine your right / left big toe is a pen.  Keeping your hip and knee still, write out the entire alphabet with your "pen." Make the letters as large as you can without increasing any discomfort. Repeat ___3_______ times. Complete this exercise ____2-3______ times per day.   STRENGTHENING EXERCISES - Ankle Sprain, Acute -Phase I, Weeks 1 to 2 These exercises may help you when beginning to restore strength in your ankle. You will likely work on these exercises for 1 to 2 weeks after your injury. Once your physician, physical therapist, or athletic trainer sees adequate progress, he or she will advance your exercises. While completing these  exercises, remember:   Muscles can gain both the endurance and the strength needed for everyday activities through controlled exercises.  Complete these exercises as instructed by your physician, physical therapist, or athletic trainer. Progress the resistance and repetitions only as guided.  You may experience muscle soreness or fatigue, but the pain or discomfort you are trying to eliminate should never worsen during these exercises. If this pain does worsen, stop and make certain you are following the directions exactly. If the pain is still present after adjustments, discontinue the exercise until you can discuss the trouble with your clinician. STRENGTH - Dorsiflexors  Secure a rubber exercise band/tubing to a fixed object (i.e., table, pole) and loop the other end around your right / left foot.  Sit on the floor facing the fixed object. The band/tubing should be slightly tense when your foot is relaxed.  Slowly draw your foot back toward you using your ankle and toes.  Hold this position for ___30_______ seconds. Slowly release the tension in the band and return your foot to the starting position. Repeat _____3_____ times. Complete this exercise _____2-3_____ times per day.  STRENGTH - Plantar-flexors   Sit with your right / left leg extended. Holding onto both ends of a rubber exercise band/tubing, loop it around the ball of your foot. Keep a slight tension in the band.  Slowly push your toes away from you, pointing them downward.  Hold this position for ___30_______ seconds. Return slowly, controlling the tension in the band/tubing. Repeat ___3_______ times. Complete this exercise __2-3________ times per day.  STRENGTH - Ankle Eversion  Secure one end of a rubber exercise band/tubing to a fixed object (table, pole). Loop the other end around your foot just before your toes.  Place your fists between your knees. This will focus your strengthening at your ankle.  Drawing the  band/tubing across your opposite foot, slowly, pull your little toe out and up. Make sure the band/tubing is positioned to resist the entire motion.  Hold this position for _____30_____ seconds. Have your muscles resist the band/tubing as it slowly pulls your foot back to the starting position.  Repeat __3________ times. Complete this exercise __2-3________ times per day.  STRENGTH - Ankle Inversion  Secure one end of a rubber exercise band/tubing to a fixed object (table, pole). Loop the other end around your foot just before your toes.  Place your fists between your knees. This will focus your strengthening at your ankle.  Slowly, pull your big toe up and in, making sure the band/tubing is positioned to resist the entire motion.  Hold this position for ____30______ seconds.  Have your muscles resist the band/tubing as it slowly pulls your foot back to the starting position. Repeat _____3_____ times. Complete this exercises __2-3________ times per day.  STRENGTH - Towel Curls  Sit in a chair positioned on a non-carpeted surface.  Place your right / left foot on  a towel, keeping your heel on the floor.  Pull the towel toward your heel by only curling your toes. Keep your heel on the floor.  If instructed by your physician, physical therapist, or athletic trainer, add weight to the end of the towel. Repeat ___3_______ times. Complete this exercise __2-3________ times per day. Document Released: 05/31/2005 Document Revised: 03/16/2014 Document Reviewed: 02/11/2009 [facility] Patient Information 2015 [place], [place]. This information is not intended to replace advice given to you by your health care provider. Make sure you discuss any questions you have with your health care provider.

## 2015-01-15 ENCOUNTER — Encounter: Payer: Self-pay | Admitting: Family Medicine

## 2015-01-15 ENCOUNTER — Ambulatory Visit (INDEPENDENT_AMBULATORY_CARE_PROVIDER_SITE_OTHER): Payer: Medicare Other | Admitting: Family Medicine

## 2015-01-15 VITALS — BP 138/70 | HR 71 | Temp 97.4°F | Ht 63.0 in | Wt 180.5 lb

## 2015-01-15 DIAGNOSIS — S93402A Sprain of unspecified ligament of left ankle, initial encounter: Secondary | ICD-10-CM

## 2015-01-15 NOTE — Progress Notes (Signed)
   Subjective:    Patient ID: Morgan [name], female    DOB: June 06, 1941, 74 y.o.   MRN: [id number]  HPI  74 year old female presents for follow up severe ankle sprain. LAst OV 2/18 she was imaged and placed on Air cast.  She has been using or cast when up during the day  Today she reports she has pain with weight bearing at time, less than before. 4/10 down from 7-8/10.  She has been doing some home PT, still decreased ROM. Swelling continues but less, no redness, no warmth. Has not needed any med for pain.    Review of Systems  Constitutional: Negative for fever and fatigue.  HENT: Negative for ear pain.   Eyes: Negative for pain.  Respiratory: Negative for chest tightness and shortness of breath.   Cardiovascular: Negative for chest pain, palpitations and leg swelling.  Gastrointestinal: Negative for abdominal pain.  Genitourinary: Negative for dysuria.       Objective:   Physical Exam  Constitutional: Vital signs are normal. She appears well-developed and well-nourished. She is cooperative.  Non-toxic appearance. She does not appear ill. No distress.  HENT:  Head: Normocephalic.  Right Ear: Hearing, tympanic membrane, external ear and ear canal normal. Tympanic membrane is not erythematous, not retracted and not bulging.  Left Ear: Hearing, tympanic membrane, external ear and ear canal normal. Tympanic membrane is not erythematous, not retracted and not bulging.  Nose: No mucosal edema or rhinorrhea. Right sinus exhibits no maxillary sinus tenderness and no frontal sinus tenderness. Left sinus exhibits no maxillary sinus tenderness and no frontal sinus tenderness.  Mouth/Throat: Uvula is midline, oropharynx is clear and moist and mucous membranes are normal.  Eyes: Conjunctivae, EOM and lids are normal. Pupils are equal, round, and reactive to light. Lids are everted and swept, no foreign bodies found.  Neck: Trachea normal and normal range of motion. Neck supple. Carotid  bruit is not present. No thyroid mass and no thyromegaly present.  Cardiovascular: Normal rate, regular rhythm, S1 normal, S2 normal, normal heart sounds, intact distal pulses and normal pulses.  Exam reveals no gallop and no friction rub.   No murmur heard. Pulmonary/Chest: Effort normal and breath sounds normal. No tachypnea. No respiratory distress. She has no decreased breath sounds. She has no wheezes. She has no rhonchi. She has no rales.  Abdominal: Soft. Normal appearance and bowel sounds are normal. There is no tenderness.  Musculoskeletal:       Left ankle: She exhibits decreased range of motion and swelling. Tenderness. Lateral malleolus tenderness found.  ttp in anterior inferior and posterior malleolus  Neurological: She is alert.  Skin: Skin is warm, dry and intact. No rash noted.  Psychiatric: Her speech is normal and behavior is normal. Judgment and thought content normal. Her mood appears not anxious. Cognition and memory are normal. She does not exhibit a depressed mood.          Assessment & Plan:

## 2015-01-15 NOTE — Assessment & Plan Note (Signed)
Improving, continued pain as expected . Continue Air Cast.Given severe nature of sprain will have her follow up with D.r Copland for further eval.

## 2015-01-15 NOTE — Patient Instructions (Signed)
Continue wearing Air Cast on left ankle.  Schedule appointment for eval with Sports med, Dr. Lorelei Pont in 1-2 weeks. Continue home ROM exercises, advance as directed in packet.

## 2015-01-15 NOTE — Progress Notes (Signed)
Pre visit review using our clinic review tool, if applicable. No additional management support is needed unless otherwise documented below in the visit note. 

## 2015-01-16 ENCOUNTER — Other Ambulatory Visit: Payer: Self-pay | Admitting: Family Medicine

## 2015-01-16 NOTE — Telephone Encounter (Signed)
Last office visit 01/15/2015.  Last refilled 12/07/2014 for #[id number] with no refills.  Ok to refill?

## 2015-01-18 NOTE — Telephone Encounter (Signed)
Called to [facility] [address]

## 2015-01-19 ENCOUNTER — Ambulatory Visit (INDEPENDENT_AMBULATORY_CARE_PROVIDER_SITE_OTHER): Payer: Medicare Other | Admitting: Podiatry

## 2015-01-19 ENCOUNTER — Other Ambulatory Visit (INDEPENDENT_AMBULATORY_CARE_PROVIDER_SITE_OTHER): Payer: Medicare Other

## 2015-01-19 ENCOUNTER — Telehealth: Payer: Self-pay | Admitting: Family Medicine

## 2015-01-19 DIAGNOSIS — E119 Type 2 diabetes mellitus without complications: Secondary | ICD-10-CM

## 2015-01-19 DIAGNOSIS — M859 Disorder of bone density and structure, unspecified: Secondary | ICD-10-CM | POA: Diagnosis not present

## 2015-01-19 DIAGNOSIS — M79676 Pain in unspecified toe(s): Secondary | ICD-10-CM | POA: Diagnosis not present

## 2015-01-19 DIAGNOSIS — B351 Tinea unguium: Secondary | ICD-10-CM

## 2015-01-19 DIAGNOSIS — E7800 Pure hypercholesterolemia, unspecified: Secondary | ICD-10-CM

## 2015-01-19 DIAGNOSIS — S93402A Sprain of unspecified ligament of left ankle, initial encounter: Secondary | ICD-10-CM

## 2015-01-19 DIAGNOSIS — M8580 Other specified disorders of bone density and structure, unspecified site: Secondary | ICD-10-CM

## 2015-01-19 LAB — HEMOGLOBIN A1C: Hgb A1c MFr Bld: 7 % — ABNORMAL HIGH (ref 4.6–6.5)

## 2015-01-19 LAB — LIPID PANEL
CHOLESTEROL: 115 mg/dL (ref 0–200)
HDL: 45.6 mg/dL (ref >39.00)
LDL CALC: 48 mg/dL (ref 0–99)
NonHDL: 69.4
TRIGLYCERIDES: 109 mg/dL (ref 0.0–149.0)
Total CHOL/HDL Ratio: 3
VLDL: 21.8 mg/dL (ref 0.0–40.0)

## 2015-01-19 LAB — COMPREHENSIVE METABOLIC PANEL
ALK PHOS: 84 U/L (ref 39–117)
ALT: 24 U/L (ref 0–35)
AST: 15 U/L (ref 0–37)
Albumin: 3.9 g/dL (ref 3.5–5.2)
BILIRUBIN TOTAL: 0.5 mg/dL (ref 0.2–1.2)
BUN: 26 mg/dL — AB (ref 6–23)
CO2: 33 mEq/L — ABNORMAL HIGH (ref 19–32)
Calcium: 9.6 mg/dL (ref 8.4–10.5)
Chloride: 100 mEq/L (ref 96–112)
Creatinine, Ser: 1.01 mg/dL (ref 0.40–1.20)
GFR: 57.13 mL/min — ABNORMAL LOW (ref >60.00)
Glucose, Bld: 133 mg/dL — ABNORMAL HIGH (ref 70–99)
Potassium: 3.8 mEq/L (ref 3.5–5.1)
Sodium: 138 mEq/L (ref 135–145)
Total Protein: 6.5 g/dL (ref 6.0–8.3)

## 2015-01-19 LAB — HM DIABETES FOOT EXAM

## 2015-01-19 LAB — VITAMIN D 25 HYDROXY (VIT D DEFICIENCY, FRACTURES): VITD: 59.82 ng/mL (ref 30.00–100.00)

## 2015-01-19 NOTE — Telephone Encounter (Signed)
-----   Message from Ellamae Sia sent at 01/14/2015  2:43 PM EST ----- Regarding: Lab orders for Tuesday,3.8.16 FYI This pt has an appt with you, 3.4 and 3.18 with lab work for 3.8. Can we combine this into 1 appt, maybe on 3.4?   Also, I need lab orders or wait till you see her 3.4 and do them at that appt.   Thanks, t

## 2015-01-19 NOTE — Progress Notes (Signed)
Patient ID: Morgan [name], female   DOB: [birth year], 74 y.o.   MRN: [id number]  Subjective: 74 y.o.-year-old female  returns the office today for painful, elongated, thickened toenails which she is unable to trim herself. Denies any redness or drainage around the nails. She says approximate one month ago she fell twisting her ankle (unsure of the mechanism) and developed an ankle sprain. She was treated by her primary care physician for this. She has been continuing with exercises as well as ankle brace. She does that she continues have some discomfort to the ankle she's been referred to  Dr. Edilia Bo for this. Denies any acute changes since last appointment and no new complaints today. Denies any systemic complaints such as fevers, chills, nausea, vomiting.   Objective: AAO 3, NAD DP/PT pulses palpable, CRT less than 3 seconds Protective sensation intact with Simms Weinstein monofilament, Achilles tendon reflex intact.  Nails hypertrophic, dystrophic, elongated, brittle, discolored 10. There is subjective tenderness overlying these nails 1-5 bilaterally. There is no surrounding erythema or drainage along the nail sites. No open lesions or pre-ulcerative lesions are identified. There is tenderness to palpation overlying the left ATFL, CFL and on the course of the peroneal tendons. There is no area pinpoint bony tenderness or pain with vibratory sensation along the fibula, tibia. There is no pain along the syndesmosis or along the deltoid ligaments. Ankle joint range of motion is intact. There is mild edema overlying the lateral aspect of the ankle without any associated erythema or increase in warmth. No other areas of tenderness bilateral lower extremities. No overlying edema, erythema, increased warmth. No pain with calf compression, swelling, warmth, erythema.  Assessment: Patient presents with symptomatic onychomycosis  Plan: -Treatment options including alternatives, risks, complications  were discussed -Nails sharply debrided 10 without complication/bleeding. -Continue follow-up with her other physicians were treating the ankle sprain. I dispensed the ankle brace for more stability as she feels the ankle where she has is not getting much support. I did review the x-rays. -Discussed daily foot inspection. If there are any changes, to call the office immediately.  -Follow-up in 3 months or sooner if any problems are to arise. In the meantime, encouraged to call the office with any questions, concerns, changes symptoms.

## 2015-01-23 ENCOUNTER — Other Ambulatory Visit: Payer: Self-pay | Admitting: Family Medicine

## 2015-01-26 ENCOUNTER — Encounter: Payer: Self-pay | Admitting: Family Medicine

## 2015-01-26 ENCOUNTER — Ambulatory Visit (INDEPENDENT_AMBULATORY_CARE_PROVIDER_SITE_OTHER): Payer: Medicare Other | Admitting: Family Medicine

## 2015-01-26 VITALS — BP 128/70 | HR 68 | Temp 97.4°F | Ht 63.0 in | Wt 177.5 lb

## 2015-01-26 DIAGNOSIS — I10 Essential (primary) hypertension: Secondary | ICD-10-CM

## 2015-01-26 DIAGNOSIS — E7800 Pure hypercholesterolemia, unspecified: Secondary | ICD-10-CM

## 2015-01-26 DIAGNOSIS — E119 Type 2 diabetes mellitus without complications: Secondary | ICD-10-CM

## 2015-01-26 NOTE — Progress Notes (Signed)
74 year old female presents for 3 months follow up DM, chol etc.  Left ankle sprain: She has less pain with walking, only pain with walking if walking a long time. Still swelling at times. Still tender to touch and with ROM. She was seen by Dr. Jacqualyn Posey for podiatry. Given velcro ankle brace. She has appt with Dr. Lorelei Pont at end of the week.   Diabetes:  Improved control on victoza 2.1 mcg daily and metformin. Lab Results  Component Value Date   HGBA1C 7.0* 01/19/2015  FBS: 140 No < 60, only one time > 200 after eating late.  No foot ulcers.  Last eye exam.:  Wt down from 186 in 07/2014 Wt Readings from Last 3 Encounters:  01/26/15 177 lb 8 oz (80.513 kg)  01/15/15 180 lb 8 oz (81.874 kg)  12/31/14 178 lb 8 oz (80.967 kg)     Elevated Cholesterol: Gola LDL < 70  On lipitor 80 mg with hx of TIA, CAD Lab Results  Component Value Date   CHOL 115 01/19/2015   HDL 45.60 01/19/2015   LDLCALC 48 01/19/2015   TRIG 109.0 01/19/2015   CHOLHDL 3 01/19/2015  Using medications without problems:None Muscle aches: no chest pain, no edema, no SOB Diet: good compliance: Exercise: Other complaints:  She was on plavix for 1 year after stent. Stopped due to easy bruising. No stomach bleeding, remote hx of ulcer 30 years ago.   Review of Systems  Constitutional: Negative for fever and fatigue.  HENT: Negative for ear pain.  Eyes: Negative for pain.  Respiratory: Negative for chest tightness and shortness of breath.  Cardiovascular: Negative for chest pain, palpitations and leg swelling.  Gastrointestinal: Negative for abdominal pain.  Genitourinary: Negative for dysuria.  Objective:   Physical Exam  Constitutional: She is oriented to person, place, and time. Vital signs are normal. She appears well-developed and well-nourished. She is cooperative. Non-toxic appearance. She does not appear ill. No distress.  HENT:  Head: Normocephalic.  Right Ear: Hearing, tympanic  membrane, external ear and ear canal normal. Tympanic membrane is not erythematous, not retracted and not bulging.  Left Ear: Hearing, tympanic membrane, external ear and ear canal normal. Tympanic membrane is not erythematous, not retracted and not bulging.  Nose: No mucosal edema or rhinorrhea. Right sinus exhibits no maxillary sinus tenderness and no frontal sinus tenderness. Left sinus exhibits no maxillary sinus tenderness and no frontal sinus tenderness.  Mouth/Throat: Uvula is midline, oropharynx is clear and moist and mucous membranes are normal.  Eyes: Conjunctivae, EOM and lids are normal. Pupils are equal, round, and reactive to light. Lids are everted and swept, no foreign bodies found.  Neck: Trachea normal and normal range of motion. Neck supple. Carotid bruit is not present. No mass and no thyromegaly present.  Cardiovascular: Normal rate, regular rhythm, S1 normal, S2 normal, normal heart sounds, intact distal pulses and normal pulses. Exam reveals no gallop and no friction rub.  No murmur heard.  Pulmonary/Chest: Effort normal and breath sounds normal. Not tachypneic. No respiratory distress. She has no decreased breath sounds. She has no wheezes. She has no rhonchi. She has no rales.  Abdominal: Soft. Normal appearance and bowel sounds are normal. There is no tenderness.  Neurological: She is alert and oriented to person, place, and time. She has normal strength and normal reflexes. No cranial nerve deficit or sensory deficit. She exhibits normal muscle tone. She displays a negative Romberg sign. Coordination and gait normal. GCS eye subscore is  4. GCS verbal subscore is 5. GCS motor subscore is 6.  Nml cerebellar exam  No papilledema  Skin: Skin is warm, dry and intact. No rash noted.  Psychiatric: She has a normal mood and affect. Her speech is normal and behavior is normal. Judgment and thought content normal. Her mood appears not anxious. Cognition and memory are normal.  Cognition and memory are not impaired. She does not exhibit a depressed mood. She exhibits normal recent memory and normal remote memory.   Diabetic foot exam:per podiatry

## 2015-01-26 NOTE — Patient Instructions (Signed)
Continue healthy eating and working on weight loss.

## 2015-01-26 NOTE — Assessment & Plan Note (Signed)
Improved with victoza and metformin.  Follow up in 6 months.  Encouraged exercise, weight loss, healthy eating habits.

## 2015-01-26 NOTE — Progress Notes (Signed)
Pre visit review using our clinic review tool, if applicable. No additional management support is needed unless otherwise documented below in the visit note. 

## 2015-01-26 NOTE — Assessment & Plan Note (Signed)
Well controlled. Continue current medication.  

## 2015-01-27 NOTE — Progress Notes (Signed)
Cardiology Office Note   Date:  01/28/2015   ID:  Morgan [name], DOB [birth year], MRN [id number]  PCP:  Eliezer Lofts, MD  Cardiologist:   Candee Furbish, Dr.    Chief Complaint  Patient presents with  . Sleep Apnea  . Hypertension  . Obesity      History of Present Illness: Morgan [name] is a 74 y.o. female with a history of OSA, HTN and obesity who presents today for followup. She uses a full face mask which she tolerates.  She has a lot of mouth dryness.  She feels the pressure is adequate. She occasionally has some daytime sleepiness at the end of the day but feels refreshed when she gets up in the am. She is currently not exercising due to recent ankle injury 6 weeks ago.     Past Medical History  Diagnosis Date  . Hypertension   . High cholesterol   . Hyperlipidemia   . Angina   . Migraines     "til ~ 1980"  . Headache(784.0)     "recurring"  . Restless leg syndrome   . Weakness of right side of body     "I've had PT for it; they don't know what it's from".  CORTISONE INJECTION INTO BACK 08/30/12  . Diabetes mellitus     diet controlled/on meds  . Fibromyalgia   . Sciatic nerve pain     "from pinched nerve"  . Depression   . Atrial fibrillation     ASPIRIN FOR BLOOD THINNER  . Coronary artery disease   . GERD (gastroesophageal reflux disease)   . H/O hiatal hernia   . Complication of anesthesia     pt states has choking sensation with ET tube   . PONV (postoperative nausea and vomiting)   . Sleep apnea   . Arthritis   . Bulging discs     lumbar     Past Surgical History  Procedure Laterality Date  . Coronary artery bypass graft  2005    CABG X 2  . Coronary angioplasty with stent placement  06/2011    "1"  . Nasal septum surgery  ~ 1986  . 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 Dilation and curettage of uterus      "more than once"  . Knee arthroscopy  09/04/2012    Procedure:  ARTHROSCOPY KNEE;  Surgeon: Gearlean Alf, MD;  Location: [facility];  Service: Orthopedics;  Laterality: Right;  right knee arthroscopy with medial and lateral meniscus debridement  . Fracture surgery  ~ 2005    nose  . Total knee arthroplasty Right 07/07/2013    Procedure: RIGHT TOTAL KNEE ARTHROPLASTY;  Surgeon: Gearlean Alf, MD;  Location: [facility];  Service: Orthopedics;  Laterality: Right;     Current Outpatient Prescriptions  Medication Sig Dispense Refill  . amoxicillin (AMOXIL) 500 MG capsule Take 500 mg by mouth as needed.    [name] aspirin 325 MG tablet Take 325 mg by mouth daily.    [name] atorvastatin (LIPITOR) 80 MG tablet TAKE 1 TABLET EVERY DAY 90 tablet 1  . cetirizine (ZYRTEC) 10 MG tablet Take 10 mg by mouth daily.    . clonazePAM (KLONOPIN) 1 MG tablet TAKE 2 TABLETS BY MOUTH AT BEDTIME AS NEEDED 60 tablet 0  . EPINEPHrine (EPI-PEN) 0.3 mg/0.3 mL SOAJ injection Inject into the muscle once as needed.    . gabapentin (NEURONTIN)  300 MG capsule TAKE 1 TO 2 CAPSULES BY MOUTH AT BEDTIME 60 capsule 1  . hydrochlorothiazide (HYDRODIURIL) 25 MG tablet TAKE 1 TABLET BY MOUTH DAILY 30 tablet 5  . Liraglutide (VICTOZA) 18 MG/3ML SOPN Inject into the skin daily.     . metformin (FORTAMET) 1000 MG (OSM) 24 hr tablet TAKE 1 TABLET BY MOUTH EVERY DAY AS DIRECTED . IN COMBO WITH XR 500 30 tablet 5  . metoprolol succinate (TOPROL-XL) 100 MG 24 hr tablet TAKE 1 TABLET BY MOUTH ONCE DAILY 30 tablet 5  . Multiple Vitamins-Minerals (PRESERVISION AREDS PO) Take 1 tablet by mouth 2 (two) times daily.     . nitroGLYCERIN (NITROSTAT) 0.4 MG SL tablet Place 0.4 mg under the tongue every 5 (five) minutes as needed. For chest pain    . ONE TOUCH ULTRA TEST test strip Used to test blood sugar once daily dx: 250.00    . ONETOUCH DELICA LANCETS 19Q MISC USED TO TEST BLOOD SUGAR ONCE DAILY DX: 250.00 100 each 3  . pantoprazole (PROTONIX) 20 MG tablet TAKE 2 TABLETS BY MOUTH TWICE A DAY 360 tablet 1  . venlafaxine  XR (EFFEXOR-XR) 37.5 MG 24 hr capsule TAKE 1 CAPSULE TWICE DAILY 60 capsule 5   No current facility-administered medications for this visit.    Allergies:   Percocet and Sulfa antibiotics    Social History:  The patient  reports that she has never smoked. She has never used smokeless tobacco. She reports that she drinks about 0.6 oz of alcohol per week. She reports that she does not use illicit drugs.   Family History:  The patient's family history includes Breast cancer in her mother; Cancer (age of onset: 70) in her mother and sister; Colon cancer in her sister; Dementia in her sister; Diabetes in her sister; GER disease in her daughter; Heart disease in her brother; Heart disease (age of onset: 41) in her father; Hypertension in her brother, daughter, and sister.    ROS:  Please see the history of present illness.   Otherwise, review of systems are positive for DOE, cough.   All other systems are reviewed and negative.    PHYSICAL EXAM: VS:  BP 136/78 mmHg  Pulse 78  Ht 5\' 3"  (1.6 m)  Wt 176 lb 6.4 oz (80.015 kg)  BMI 31.26 kg/m2 , BMI Body mass index is 31.26 kg/(m^2). GEN: Well nourished, well developed, in no acute distress HEENT: normal Neck: no JVD, carotid bruits, or masses Cardiac: RRR; no murmurs, rubs, or gallops,no edema  Respiratory:  clear to auscultation bilaterally, normal work of breathing GI: soft, nontender, nondistended, + BS MS: no deformity or atrophy Skin: warm and dry, no rash Neuro:  Strength and sensation are intact Psych: euthymic mood, full affect   EKG:  EKG is not ordered today.    Recent Labs: 01/19/2015: ALT 24; BUN 26*; Creatinine 1.01; Potassium 3.8; Sodium 138    Lipid Panel    Component Value Date/Time   CHOL 115 01/19/2015 0851   TRIG 109.0 01/19/2015 0851   HDL 45.60 01/19/2015 0851   CHOLHDL 3 01/19/2015 0851   VLDL 21.8 01/19/2015 0851   LDLCALC 48 01/19/2015 0851      Wt Readings from Last 3 Encounters:  01/28/15 176 lb  6.4 oz (80.015 kg)  01/26/15 177 lb 8 oz (80.513 kg)  01/15/15 180 lb 8 oz (81.874 kg)    ASSESSMENT AND PLAN:  1. OSA on CPAP - I will get a download  from her DME 2. HTN - well controlled - continue HCTZ/metoprolol   3.       Obesity - she cannot exercise currently due to an ankle injury   4.       DOE with extreme exertion but she has been very sedentary the past year due to orthopedic injuries. She has an appt with Dr. Marlou Porch in the near future.   Current medicines are reviewed at length with the patient today.  The patient does not have concerns regarding medicines.  The following changes have been made:  no change  Labs/ tests ordered today include: NOne  No orders of the defined types were placed in this encounter.     Disposition:   FU with me in 6 months   Signed, Sueanne Margarita, MD  01/28/2015 3:33 PM    [facility] [address], [place], [place]  [address] Phone: [phone]; Fax: [phone]

## 2015-01-28 ENCOUNTER — Encounter: Payer: Self-pay | Admitting: Cardiology

## 2015-01-28 ENCOUNTER — Ambulatory Visit (INDEPENDENT_AMBULATORY_CARE_PROVIDER_SITE_OTHER): Payer: Medicare Other | Admitting: Cardiology

## 2015-01-28 VITALS — BP 136/78 | HR 78 | Ht 63.0 in | Wt 176.4 lb

## 2015-01-28 DIAGNOSIS — E669 Obesity, unspecified: Secondary | ICD-10-CM

## 2015-01-28 DIAGNOSIS — I10 Essential (primary) hypertension: Secondary | ICD-10-CM

## 2015-01-28 DIAGNOSIS — G4733 Obstructive sleep apnea (adult) (pediatric): Secondary | ICD-10-CM

## 2015-01-28 NOTE — Patient Instructions (Signed)
Your physician wants you to follow-up in: 6 months with Dr. Radford Pax. You will receive a reminder letter in the mail two months in advance. If you don't receive a letter, please call our office to schedule the follow-up appointment.

## 2015-02-03 ENCOUNTER — Encounter: Payer: Self-pay | Admitting: Family Medicine

## 2015-02-03 ENCOUNTER — Ambulatory Visit (INDEPENDENT_AMBULATORY_CARE_PROVIDER_SITE_OTHER): Payer: Medicare Other | Admitting: Family Medicine

## 2015-02-03 VITALS — BP 120/64 | HR 68 | Temp 97.7°F | Ht 63.0 in | Wt 178.8 lb

## 2015-02-03 DIAGNOSIS — S93402D Sprain of unspecified ligament of left ankle, subsequent encounter: Secondary | ICD-10-CM | POA: Diagnosis not present

## 2015-02-03 NOTE — Progress Notes (Signed)
Pre visit review using our clinic review tool, if applicable. No additional management support is needed unless otherwise documented below in the visit note. 

## 2015-02-03 NOTE — Progress Notes (Signed)
Dr. Frederico Hamman T. Letitia Sabala, MD, [facility] [place], [address] Phone: [phone] Fax: [phone]  02/03/2015  Patient: Morgan [name], MRN: [id number], DOB: 10-12-1941, 74 y.o.  Primary Physician:  Eliezer Lofts, MD  Chief Complaint: Follow-up  Subjective:   Morgan [name] is a 74 y.o. very pleasant female patient who presents with the following:  5 weeks out, l ankle. Pleasant lady who injured her ankle approximately 5 or 6 weeks ago, and she has intermittently been using an Aircast and has used an ASO type ankle brace.  She is still having some pain medially and laterally, but her swelling and pain levels had decreased over time.  Her bruising is gone at this point.  She is able to walk without much of a significant limp.  Past Medical History, Surgical History, Social History, Family History, Problem List, Medications, and Allergies have been reviewed and updated if relevant.  GEN: No fevers, chills. Nontoxic. Primarily MSK c/o today. MSK: Detailed in the HPI GI: tolerating PO intake without difficulty Neuro: No numbness, parasthesias, or tingling associated. Otherwise the pertinent positives of the ROS are noted above.   Objective:   BP 120/64 mmHg  Pulse 68  Temp(Src) 97.7 F (36.5 C) (Oral)  Ht 5\' 3"  (1.6 m)  Wt 178 lb 12 oz (81.08 kg)  BMI 31.67 kg/m2   GEN: WDWN, NAD, Non-toxic, Alert & Oriented x 3 HEENT: Atraumatic, Normocephalic.  Ears and Nose: No external deformity. EXTR: No clubbing/cyanosis/edema NEURO: Normal gait.  PSYCH: Normally interactive. Conversant. Not depressed or anxious appearing.  Calm demeanor.    All bony anatomy is grossly nontender throughout the foot and ankle on the left.  Patient does have some tenderness at the deltoid ligament, ATFL, and CFL ligaments.  Her pain is greater laterally.  Drawer testing is also mildly tender.  Subtalar tilt is normal.  Radiology: LEFT ANKLE  COMPLETE - 3+ VIEW  COMPARISON: None.  FINDINGS: No acute bony or joint abnormality identified. No evidence of fracture or dislocation.  IMPRESSION: Soft tissue swelling. No acute bony abnormality identified.   Electronically Signed  By: Marcello Moores Register  On: 01/01/2015 09:07  Assessment and Plan:   Severe sprain of left ankle, subsequent encounter  Progressing as I would expect. ASO for 3 more weeks, then titrate out. Given Vanderbilt ankle protocol.  All films reviewed independently. No evidence of fracture.   Follow-up: Return in about 4 weeks (around 03/03/2015).  New Prescriptions   No medications on file   No orders of the defined types were placed in this encounter.    Signed,  Maud Deed. Pasqualino Witherspoon, MD   Patient's Medications  New Prescriptions   No medications on file  Previous Medications   AMOXICILLIN (AMOXIL) 500 MG CAPSULE    Take 500 mg by mouth as needed.   ASPIRIN 325 MG TABLET    Take 325 mg by mouth daily.   ATORVASTATIN (LIPITOR) 80 MG TABLET    TAKE 1 TABLET EVERY DAY   CETIRIZINE (ZYRTEC) 10 MG TABLET    Take 10 mg by mouth daily.   CLONAZEPAM (KLONOPIN) 1 MG TABLET    TAKE 2 TABLETS BY MOUTH AT BEDTIME AS NEEDED   EPINEPHRINE (EPI-PEN) 0.3 MG/0.3 ML SOAJ INJECTION    Inject into the muscle once as needed.   GABAPENTIN (NEURONTIN) 300 MG CAPSULE    TAKE 1 TO 2 CAPSULES BY MOUTH AT BEDTIME   HYDROCHLOROTHIAZIDE (HYDRODIURIL) 25 MG TABLET  TAKE 1 TABLET BY MOUTH DAILY   LIRAGLUTIDE (VICTOZA) 18 MG/3ML SOPN    Inject into the skin daily.    METFORMIN (FORTAMET) 1000 MG (OSM) 24 HR TABLET    TAKE 1 TABLET BY MOUTH EVERY DAY AS DIRECTED . IN COMBO WITH XR 500   METOPROLOL SUCCINATE (TOPROL-XL) 100 MG 24 HR TABLET    TAKE 1 TABLET BY MOUTH ONCE DAILY   MULTIPLE VITAMINS-MINERALS (PRESERVISION AREDS PO)    Take 1 tablet by mouth 2 (two) times daily.    NITROGLYCERIN (NITROSTAT) 0.4 MG SL TABLET    Place 0.4 mg under the tongue every 5 (five)  minutes as needed. For chest pain   ONE TOUCH ULTRA TEST TEST STRIP    Used to test blood sugar once daily dx: 726.20   ONETOUCH DELICA LANCETS 35D MISC    USED TO TEST BLOOD SUGAR ONCE DAILY DX: 250.00   PANTOPRAZOLE (PROTONIX) 20 MG TABLET    TAKE 2 TABLETS BY MOUTH TWICE A DAY   PROBIOTIC PRODUCT (PROBIOTIC DAILY PO)    Take 1 tablet by mouth 3 (three) times daily before meals.   VENLAFAXINE XR (EFFEXOR-XR) 37.5 MG 24 HR CAPSULE    TAKE 1 CAPSULE TWICE DAILY  Modified Medications   No medications on file  Discontinued Medications   No medications on file

## 2015-02-10 ENCOUNTER — Encounter: Payer: Self-pay | Admitting: Family Medicine

## 2015-02-14 ENCOUNTER — Other Ambulatory Visit: Payer: Self-pay | Admitting: Family Medicine

## 2015-02-28 ENCOUNTER — Other Ambulatory Visit: Payer: Self-pay | Admitting: Family Medicine

## 2015-02-28 NOTE — Telephone Encounter (Signed)
Last office visit 02/03/2015 with Dr. Lorelei Pont.  Last refilled 01/18/2015 for #60 with no refills.  Ok to refill?

## 2015-03-01 NOTE — Telephone Encounter (Signed)
Called to [facility] [address]

## 2015-03-02 NOTE — Consult Note (Signed)
PATIENT NAME:  Morgan [name], Morgan [name] MR#:  [id number] DATE OF BIRTH:  1941/04/22  DATE OF CONSULTATION:  10/14/2012  REFERRING PHYSICIAN:   CONSULTING PHYSICIAN:  Huey Romans, MD  REASON FOR CONSULTATION: Acute tongue swelling.   HISTORY OF PRESENT ILLNESS: The patient is a 74 year old white female who has never had angioedema before. She had acute onset of this at approximately 9:00 p.m. She presented to the emergency room within an hour or so of it starting. She was immediately given some Benadryl, epinephrine, and Solu-Medrol. It is now an hour or so after treatment and she is feeling okay, her tongue swelling has stopped, it is still swollen but is not growing or getting any larger, and she feels stable. She is not short of breath. She is not hoarse. She is not coughing at all and is breathing easily.  PAST MEDICAL HISTORY: Her past medical history is significant for having some underlying cardiac issues and is on cardiac medications. She has not changed any of her medications recently. She is not on any ACE inhibitors.   She has had previous myocardial infarction and is on beta blockers. She has had hypertension as well. She also has sleep apnea and uses CPAP. She had knee surgery several years ago.   CURRENT MEDICATIONS: Reviewed with the patient and her husband. She is on metoprolol and Cozaar. She takes Nexium as well and multiple different vitamins. She does take Plavix.   ALLERGIES: Sulfa gives her a rash and Percocet gives her hallucinations.   SOCIAL HISTORY: She lives at home with her spouse. She does not smoke or drink alcohol.   PHYSICAL EXAMINATION: Her tongue is somewhat swollen. It is lifted up about 1 inch above normal. Floor of the mouth does not have watery edema. She has pretty good mobility of her tongue. You can see the roof of the mouth. She is not air hungry or short of breath. She is not hoarse at all. There is no swelling of her neck or sublingual area or submental area.  It is all soft and mobile. No tenderness or redness anywhere. She is not running any fevers.   IMPRESSION AND RECOMMENDATIONS: The patient has acute angioedema, but we do not know the underlying source. There is no obvious medication that is the underlying cause of this. Her husband is going to start a food diary to record everything that she ate today and try to keep track of this in case she has more problems. We will plan to see her in the office sometime later in the week or next week to reevaluate and make sure she is clear and plan to do some RAST testing to look for possible sources of angioedema. She will let us know if there is any acute swelling again and she will start on Benadryl 50 mg orally immediately upon onset of swelling. She will be watched in the emergency room for several more hours to make sure the swelling starts coming down and once she can swallow okay then she can continue taking Benadryl at home until the swelling is gone. If she gets any further swelling, then we may need to put in a scope to look at her larynx, but at this point I do not feel that is indicated. She should do very well and we will reevaluate in the office.  ____________________________ Huey Romans, MD phj:slb D: 10/14/2012 23:57:21 ET T: 10/15/2012 11:03:31 ET JOB#: [id number]  cc: Huey Romans, MD, <Dictator> Huey Romans MD  ELECTRONICALLY SIGNED 10/16/2012 11:51

## 2015-03-15 ENCOUNTER — Ambulatory Visit (INDEPENDENT_AMBULATORY_CARE_PROVIDER_SITE_OTHER): Payer: Medicare Other | Admitting: Family Medicine

## 2015-03-15 ENCOUNTER — Encounter: Payer: Self-pay | Admitting: Family Medicine

## 2015-03-15 VITALS — BP 130/74 | HR 67 | Temp 97.7°F | Ht 63.0 in | Wt 179.0 lb

## 2015-03-15 DIAGNOSIS — S93402D Sprain of unspecified ligament of left ankle, subsequent encounter: Secondary | ICD-10-CM

## 2015-03-15 NOTE — Progress Notes (Signed)
Dr. Frederico Hamman T. Hosanna Betley, MD, [facility] [place], [address] Phone: [phone] Fax: [phone]  03/15/2015  Patient: Morgan [name], MRN: [id number], DOB: July 03, 1941, 74 y.o.  Primary Physician:  Eliezer Lofts, MD  Chief Complaint: Follow-up  Subjective:   Morgan [name] is a 74 y.o. very pleasant female patient who presents with the following:  L ankle: Initial injury was in February 2016. The patient is now doing well, she is rarely using her ASO brace, but her ankle is mostly asymptomatic. She does still occasionally have some swelling after she is on it for a long time.  02/03/2015 Last OV with Owens Loffler, MD  5 weeks out, l ankle. Pleasant lady who injured her ankle approximately 5 or 6 weeks ago, and she has intermittently been using an Aircast and has used an ASO type ankle brace.  She is still having some pain medially and laterally, but her swelling and pain levels had decreased over time.  Her bruising is gone at this point.  She is able to walk without much of a significant limp.  Past Medical History, Surgical History, Social History, Family History, Problem List, Medications, and Allergies have been reviewed and updated if relevant.  GEN: No fevers, chills. Nontoxic. Primarily MSK c/o today. MSK: Detailed in the HPI GI: tolerating PO intake without difficulty Neuro: No numbness, parasthesias, or tingling associated. Otherwise the pertinent positives of the ROS are noted above.   Objective:   BP 130/74 mmHg  Pulse 67  Temp(Src) 97.7 F (36.5 C) (Oral)  Ht 5\' 3"  (1.6 m)  Wt 179 lb (81.194 kg)  BMI 31.72 kg/m2   GEN: WDWN, NAD, Non-toxic, Alert & Oriented x 3 HEENT: Atraumatic, Normocephalic.  Ears and Nose: No external deformity. EXTR: No clubbing/cyanosis/edema NEURO: Normal gait.  PSYCH: Normally interactive. Conversant. Not depressed or anxious appearing.  Calm demeanor.    All bony  anatomy is grossly nontender throughout the foot and ankle on the left.  Patient does not have Tenderness at the deltoid ligament, ATFL, and CFL ligaments.  All bony anatomy TTP.  Drawer testing is also NT.  Subtalar tilt is normal.  Radiology: LEFT ANKLE COMPLETE - 3+ VIEW  COMPARISON: None.  FINDINGS: No acute bony or joint abnormality identified. No evidence of fracture or dislocation.  IMPRESSION: Soft tissue swelling. No acute bony abnormality identified.   Electronically Signed  By: Marcello Moores Register  On: 01/01/2015 09:07  Assessment and Plan:   Severe sprain of left ankle, subsequent encounter  Doing great Cont home stretching and str  Follow-up: prn  Signed,  Shanisha Lech T. Verlee Pope, MD   Patient's Medications  New Prescriptions   No medications on file  Previous Medications   AMOXICILLIN (AMOXIL) 500 MG CAPSULE    Take 500 mg by mouth as needed.   ASPIRIN 325 MG TABLET    Take 325 mg by mouth daily.   ATORVASTATIN (LIPITOR) 80 MG TABLET    TAKE 1 TABLET EVERY DAY   CETIRIZINE (ZYRTEC) 10 MG TABLET    Take 10 mg by mouth daily.   CLONAZEPAM (KLONOPIN) 1 MG TABLET    TAKE 2 TABLETS BY MOUTH AT BEDTIME AS NEEDED   EPINEPHRINE (EPI-PEN) 0.3 MG/0.3 ML SOAJ INJECTION    Inject into the muscle once as needed.   GABAPENTIN (NEURONTIN) 300 MG CAPSULE    TAKE 1 TO 2 CAPSULES BY MOUTH AT BEDTIME   GLUCOSE BLOOD (ONE TOUCH ULTRA TEST) TEST  STRIP    Use to check blood sugar twice a day. DX: E11.9   HYDROCHLOROTHIAZIDE (HYDRODIURIL) 25 MG TABLET    TAKE 1 TABLET BY MOUTH DAILY   LIRAGLUTIDE (VICTOZA) 18 MG/3ML SOPN    Inject into the skin daily.    METFORMIN (FORTAMET) 1000 MG (OSM) 24 HR TABLET    TAKE 1 TABLET BY MOUTH EVERY DAY AS DIRECTED . IN COMBO WITH XR 500   METOPROLOL SUCCINATE (TOPROL-XL) 100 MG 24 HR TABLET    TAKE 1 TABLET BY MOUTH ONCE DAILY   MULTIPLE VITAMINS-MINERALS (PRESERVISION AREDS PO)    Take 1 tablet by mouth 2 (two) times daily.     NITROGLYCERIN (NITROSTAT) 0.4 MG SL TABLET    Place 0.4 mg under the tongue every 5 (five) minutes as needed. For chest pain   ONETOUCH DELICA LANCETS 79K MISC    USED TO TEST BLOOD SUGAR ONCE DAILY DX: 250.00   PANTOPRAZOLE (PROTONIX) 20 MG TABLET    TAKE 2 TABLETS BY MOUTH TWICE A DAY   PROBIOTIC PRODUCT (PROBIOTIC DAILY PO)    Take 1 tablet by mouth 3 (three) times daily before meals.   VENLAFAXINE XR (EFFEXOR-XR) 37.5 MG 24 HR CAPSULE    TAKE 1 CAPSULE TWICE DAILY  Modified Medications   No medications on file  Discontinued Medications   No medications on file

## 2015-03-15 NOTE — Progress Notes (Signed)
Pre visit review using our clinic review tool, if applicable. No additional management support is needed unless otherwise documented below in the visit note. 

## 2015-03-16 ENCOUNTER — Ambulatory Visit: Payer: Medicare Other | Admitting: Family Medicine

## 2015-03-17 ENCOUNTER — Ambulatory Visit (INDEPENDENT_AMBULATORY_CARE_PROVIDER_SITE_OTHER): Payer: Medicare Other | Admitting: Cardiology

## 2015-03-17 ENCOUNTER — Encounter: Payer: Self-pay | Admitting: Cardiology

## 2015-03-17 VITALS — BP 118/70 | HR 69 | Ht 63.0 in | Wt 178.0 lb

## 2015-03-17 DIAGNOSIS — G458 Other transient cerebral ischemic attacks and related syndromes: Secondary | ICD-10-CM | POA: Diagnosis not present

## 2015-03-17 DIAGNOSIS — I2510 Atherosclerotic heart disease of native coronary artery without angina pectoris: Secondary | ICD-10-CM

## 2015-03-17 DIAGNOSIS — I2583 Coronary atherosclerosis due to lipid rich plaque: Secondary | ICD-10-CM

## 2015-03-17 DIAGNOSIS — W19XXXA Unspecified fall, initial encounter: Secondary | ICD-10-CM | POA: Diagnosis not present

## 2015-03-17 DIAGNOSIS — I10 Essential (primary) hypertension: Secondary | ICD-10-CM

## 2015-03-17 NOTE — Progress Notes (Signed)
Morgan [name]. [address] [place], [place]  [address] Phone: [phone] Fax:  [phone]  Date:  03/17/2015   ID:  Morgan [name], DOB [birth year], MRN [id number]  PCP:  Morgan Lofts, MD   History of Present Illness: Morgan [name] is a 74 y.o. female with coronary artery disease status post bypass surgery in 2006, diagonal stent, DM here for followup.   Had episode of angioedema (testing for allergies has been done - negative except for cockroach). in April 2014, I stopped her losartan because of the small chance of cross reactivity/angioedema. Her blood pressure today was excellent. Cough has improved. ENT [facility]).   In review of her coronary disease, in April of 2012 she had cardiac catheterization with drug-eluting stent the first diagonal branch that had previously been determined to small for bypass in 2006. She completed cardiac rehabilitation. Approximately a year later she was having chest discomfort moderate in intensity but mostly leg pain/rheumatologic complaints. She underwent nuclear stress test on 02/05/12 which was low risk showing only possible subtle ischemia in the distal anterolateral segment. Normal ejection fraction.   Hyperlipidemia-last LDL 58 on 8/14. Excellent. Blood pressure also under good control.   Glucose mildly elevated. Hemoglobin A1C was 8.4.  Knee replacement.   Feeling mild SOB, HA. Reminds her slightly of the way she felt prior to her diagonal, small caliber PCI.  Post knee.   09/18/14 - Had cerebellar TIA. Nausea, flashes in front of eye. Working on diabetes. Metformin, diarrhea. Hemoglobin A1c 8.2. Excellent LDL 59. Mild bilateral carotid plaque.  03/17/15 - fell, sprained elbow. Ankle sprain with second fall. Has some mild shortness of breath upon long walking excursions. No chest pain. No sig. She has had 2 falls. Sprained ankle, elbow.   Wt Readings from Last 3 Encounters:  03/17/15 178 lb (80.74 kg)  03/15/15 179 lb (81.194 kg)    02/03/15 178 lb 12 oz (81.08 kg)     Past Medical History  Diagnosis Date  . Hypertension   . High cholesterol   . Hyperlipidemia   . Angina   . Migraines     "til ~ 1980"  . Headache(784.0)     "recurring"  . Restless leg syndrome   . Weakness of right side of body     "I've had PT for it; they don't know what it's from".  CORTISONE INJECTION INTO BACK 08/30/12  . Diabetes mellitus     diet controlled/on meds  . Fibromyalgia   . Sciatic nerve pain     "from pinched nerve"  . Depression   . Atrial fibrillation     ASPIRIN FOR BLOOD THINNER  . Coronary artery disease   . GERD (gastroesophageal reflux disease)   . H/O hiatal hernia   . Complication of anesthesia     pt states has choking sensation with ET tube   . PONV (postoperative nausea and vomiting)   . Sleep apnea   . Arthritis   . Bulging discs     lumbar     Past Surgical History  Procedure Laterality Date  . Coronary artery bypass graft  2005    CABG X 2  . Coronary angioplasty with stent placement  06/2011    "1"  . Nasal septum surgery  ~ 1986  . 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 Dilation and curettage of uterus      "more than once"  .  Knee arthroscopy  09/04/2012    Procedure: ARTHROSCOPY KNEE;  Surgeon: Gearlean Alf, MD;  Location: [facility];  Service: Orthopedics;  Laterality: Right;  right knee arthroscopy with medial and lateral meniscus debridement  . Fracture surgery  ~ 2005    nose  . Total knee arthroplasty Right 07/07/2013    Procedure: RIGHT TOTAL KNEE ARTHROPLASTY;  Surgeon: Gearlean Alf, MD;  Location: [facility];  Service: Orthopedics;  Laterality: Right;    Current Outpatient Prescriptions  Medication Sig Dispense Refill  . amoxicillin (AMOXIL) 500 MG capsule Take 500 mg by mouth as needed.    [name] aspirin 325 MG tablet Take 325 mg by mouth daily.    [name] atorvastatin (LIPITOR) 80 MG tablet TAKE 1 TABLET EVERY DAY 90 tablet 1  .  cetirizine (ZYRTEC) 10 MG tablet Take 10 mg by mouth daily.    . clonazePAM (KLONOPIN) 1 MG tablet TAKE 2 TABLETS BY MOUTH AT BEDTIME AS NEEDED 60 tablet 0  . EPINEPHrine (EPI-PEN) 0.3 mg/0.3 mL SOAJ injection Inject into the muscle once as needed.    . gabapentin (NEURONTIN) 300 MG capsule TAKE 1 TO 2 CAPSULES BY MOUTH AT BEDTIME 60 capsule 1  . glucose blood (ONE TOUCH ULTRA TEST) test strip Use to check blood sugar twice a day. DX: E11.9 100 each 5  . hydrochlorothiazide (HYDRODIURIL) 25 MG tablet TAKE 1 TABLET BY MOUTH DAILY 30 tablet 5  . Liraglutide (VICTOZA) 18 MG/3ML SOPN Inject into the skin daily.     . metformin (FORTAMET) 1000 MG (OSM) 24 hr tablet TAKE 1 TABLET BY MOUTH EVERY DAY AS DIRECTED . IN COMBO WITH XR 500 30 tablet 5  . metoprolol succinate (TOPROL-XL) 100 MG 24 hr tablet TAKE 1 TABLET BY MOUTH ONCE DAILY 30 tablet 5  . Multiple Vitamins-Minerals (PRESERVISION AREDS PO) Take 1 tablet by mouth 2 (two) times daily.     . nitroGLYCERIN (NITROSTAT) 0.4 MG SL tablet Place 0.4 mg under the tongue every 5 (five) minutes as needed. For chest pain    . ONETOUCH DELICA LANCETS 30Z MISC USED TO TEST BLOOD SUGAR ONCE DAILY DX: 250.00 100 each 3  . pantoprazole (PROTONIX) 20 MG tablet TAKE 2 TABLETS BY MOUTH TWICE A DAY 360 tablet 1  . Probiotic Product (PROBIOTIC DAILY PO) Take 1 tablet by mouth 3 (three) times daily before meals.    . venlafaxine XR (EFFEXOR-XR) 37.5 MG 24 hr capsule TAKE 1 CAPSULE TWICE DAILY 60 capsule 5   No current facility-administered medications for this visit.    Allergies:    Allergies  Allergen Reactions  . Percocet [Oxycodone-Acetaminophen] Other (See Comments)    hallucination  . Sulfa Antibiotics Other (See Comments)    hallucinations    Social History:  The patient  reports that she has never smoked. She has never used smokeless tobacco. She reports that she drinks about 0.6 oz of alcohol per week. She reports that she does not use illicit drugs.     ROS:  Please see the history of present illness.   Denies any fevers, chills, orthopnea, PND, syncope    PHYSICAL EXAM: VS:  BP 118/70 mmHg  Pulse 69  Ht 5\' 3"  (1.6 m)  Wt 178 lb (80.74 kg)  BMI 31.54 kg/m2 Well nourished, well developed, in no acute distress HEENT: normal Neck: no JVD Cardiac:  normal S1, S2; RRR; no murmurPrior bypass scar well healed Lungs:  clear to auscultation bilaterally, no wheezing, rhonchi or rales Abd: soft,  nontender, no hepatomegaly Ext: no edema.  Skin: warm and dry Neuro: no focal abnormalities noted  EKG:  today-03/17/15-sinus rhythm, 69, nonspecific T-wave changes-no change from prior.03/23/14-sinus rhythm, 63, nonspecific ST flattening. Overall normal.   ASSESSMENT AND PLAN:  1. Coronary artery disease-status post bypass/ DES placement. Overall doing well. Monitor her symptoms. Continue beta blocker. Off Plavix. 2. TIA-cerebellar. Nausea, vestibular symptoms. Eye Dr. Diagnosis. No evidence of AFIB. Has had a few issues (floating black spots, sick to stomach, occas HA) may be think of potential migraines. 3. S/p knee replacement 07/07/13 - Dr. Wynelle Link. Proved.  4. Hyperlipidemia-excellent lipids. Reviewed labs. Continue with atorvastatin. LDL 48. HTN-mildly elevated. Continue to monitor. Her diastolic is quite low. We will continue with current medications. 5. Diabetes-metformin. Glucose reviewed. She is quite pleased. A1c 8.2 now 7.0 6. Obesity-encourage weight loss once knee pain/physical therapy is improving. Exercise. Be careful with falls. 7. We will see back in 6 months.  Signed, Candee Furbish, MD [facility]  03/17/2015 10:54 AM

## 2015-03-17 NOTE — Patient Instructions (Signed)
Medication Instructions:  Your physician recommends that you continue on your current medications as directed. Please refer to the Current Medication list given to you today.  Labwork: None  Testing/Procedures: None  Follow-Up: Follow up in 6 months with Dr. Marlou Porch.  You will receive a letter in the mail 2 months before you are due.  Please call us when you receive this letter to schedule your follow up appointment.  Thank you for choosing [facility]!!

## 2015-04-06 ENCOUNTER — Other Ambulatory Visit: Payer: Self-pay | Admitting: Cardiology

## 2015-04-07 ENCOUNTER — Other Ambulatory Visit: Payer: Self-pay | Admitting: Family Medicine

## 2015-04-09 ENCOUNTER — Other Ambulatory Visit: Payer: Self-pay | Admitting: Family Medicine

## 2015-04-09 NOTE — Telephone Encounter (Signed)
Last office visit 03/15/2015.  Last refilled 02/28/2015 for #60 with no refills.  Ok to refill?

## 2015-04-09 NOTE — Telephone Encounter (Signed)
Called to [facility] [address]

## 2015-04-10 ENCOUNTER — Other Ambulatory Visit: Payer: Self-pay | Admitting: Family Medicine

## 2015-04-10 NOTE — Telephone Encounter (Signed)
Last office visit 03/15/2015 with Dr. Lorelei Pont.  Last refilled 01/23/2015 for #60 with 1 refill.  Ok to refill?

## 2015-04-15 ENCOUNTER — Other Ambulatory Visit: Payer: Self-pay | Admitting: Cardiology

## 2015-04-17 ENCOUNTER — Other Ambulatory Visit: Payer: Self-pay | Admitting: Family Medicine

## 2015-04-22 ENCOUNTER — Ambulatory Visit (INDEPENDENT_AMBULATORY_CARE_PROVIDER_SITE_OTHER): Payer: Medicare Other | Admitting: Podiatry

## 2015-04-22 DIAGNOSIS — B351 Tinea unguium: Secondary | ICD-10-CM

## 2015-04-22 DIAGNOSIS — M79676 Pain in unspecified toe(s): Secondary | ICD-10-CM

## 2015-04-22 NOTE — Progress Notes (Signed)
Patient ID: Morgan [name], female   DOB: [birth year], 74 y.o.   MRN: [id number]  Subjective: 74 y.o.-year-old female returns the office today for painful, elongated, thickened toenails which she is unable to trim herself. Denies any redness or drainage around the nails. Denies any acute changes since last appointment and no new complaints today. Denies any systemic complaints such as fevers, chills, nausea, vomiting.   Objective: AAO 3, NAD DP/PT pulses palpable, CRT less than 3 seconds Protective sensation intact with Simms Weinstein monofilament, Achilles tendon reflex intact.  Nails hypertrophic, dystrophic, elongated, brittle, discolored 10. There is tenderness overlying the nails 1-5 bilaterally. There is no surrounding erythema or drainage along the nail sites. No open lesions or pre-ulcerative lesions are identified. No other areas of tenderness bilateral lower extremities. No overlying edema, erythema, increased warmth. No pain with calf compression, swelling, warmth, erythema.  Assessment: Patient presents with symptomatic onychomycosis  Plan: -Treatment options including alternatives, risks, complications were discussed -Nails sharply debrided 10 without complication/bleeding. -Discussed daily foot inspection. If there are any changes, to call the office immediately.  -Follow-up in 3 months or sooner if any problems are to arise. In the meantime, encouraged to call the office with any questions, concerns, changes symptoms.

## 2015-04-24 ENCOUNTER — Other Ambulatory Visit: Payer: Self-pay | Admitting: Family Medicine

## 2015-04-25 NOTE — Telephone Encounter (Signed)
Last office visit 03/15/2015 with Dr. Lorelei Pont.  Last refilled 04/12/2015 for #60 with 1 refill. Ok to refill?

## 2015-04-26 ENCOUNTER — Other Ambulatory Visit: Payer: Self-pay | Admitting: Family Medicine

## 2015-05-03 ENCOUNTER — Ambulatory Visit (INDEPENDENT_AMBULATORY_CARE_PROVIDER_SITE_OTHER): Payer: Medicare Other | Admitting: Family Medicine

## 2015-05-03 ENCOUNTER — Encounter: Payer: Self-pay | Admitting: Family Medicine

## 2015-05-03 ENCOUNTER — Ambulatory Visit (INDEPENDENT_AMBULATORY_CARE_PROVIDER_SITE_OTHER)
Admission: RE | Admit: 2015-05-03 | Discharge: 2015-05-03 | Disposition: A | Discharge status: Home / Self Care | Payer: Medicare Other | Source: Ambulatory Visit | Attending: Family Medicine | Admitting: Family Medicine

## 2015-05-03 VITALS — BP 114/70 | HR 74 | Temp 97.7°F | Ht 63.0 in | Wt 179.5 lb

## 2015-05-03 DIAGNOSIS — M25522 Pain in left elbow: Secondary | ICD-10-CM

## 2015-05-03 DIAGNOSIS — M12522 Traumatic arthropathy, left elbow: Secondary | ICD-10-CM | POA: Diagnosis not present

## 2015-05-03 MED ORDER — DICLOFENAC SODIUM 75 MG PO TBEC: 75.0000 mg | DELAYED_RELEASE_TABLET | Freq: Two times a day (BID) | ORAL | Status: DC

## 2015-05-03 MED ORDER — TRAMADOL HCL 50 MG PO TABS: 50.0000 mg | ORAL_TABLET | Freq: Four times a day (QID) | ORAL | Status: DC | PRN

## 2015-05-03 NOTE — Progress Notes (Signed)
Dr. Frederico Hamman T. Omare Bilotta, MD, [facility] [place], [address] Phone: [phone] Fax: [phone]  05/03/2015  Patient: Morgan [name], MRN: [id number], DOB: November 10, 1941, 74 y.o.  Primary Physician:  Eliezer Lofts, MD  Chief Complaint: Fall  Subjective:   Morgan [name] is a 74 y.o. very pleasant female patient who presents with the following:  Pleasant lady who I remember well From prior office visits.  4 days ago, she fell directly on her left elbow, and she is been having some pain since then.  There is some minimal swelling only, and she is roughly able to move her elbow similarly to before.  The history is significant for a complicated fracture involving multiple bones at the age of 75 years old.  The history is not completely clear, but it's possible that she also could've sustained a dislocation at that point.  She does have evidence of prior coronoid fracture suggesting this.  Past Medical History, Surgical History, Social History, Family History, Problem List, Medications, and Allergies have been reviewed and updated if relevant.  Patient Active Problem List   Diagnosis Date Noted  . Left foot pain 12/31/2014  . Severe sprain of left ankle 12/31/2014  . Fall, accidental 12/31/2014  . Osteopenia 09/15/2014  . TIA (transient ischemic attack) 05/19/2014  . Fracture of coronoid process of ulna, left, closed 04/25/2014  . Ingrown toenail 01/15/2014  . Obesity (BMI 30-39.9) 10/08/2013  . OA (osteoarthritis) of knee 07/07/2013  . Chronic low back pain 05/09/2013  . Obstructive sleep apnea 05/09/2013  . GERD (gastroesophageal reflux disease) 05/09/2013  . Hiatal hernia 05/09/2013  . Fibromyalgia syndrome 05/09/2013  . Depression 05/09/2013  . RLS (restless legs syndrome) 05/09/2013  . Diabetes mellitus with no complication 67/20/9470  . High cholesterol 05/09/2013  . 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 Family  history of colon cancer 05/09/2013  . Allergic rhinitis 05/09/2013  . Lateral meniscal tear 09/04/2012  . CAD (coronary artery disease) 09/27/2011  . HTN (hypertension) 09/27/2011  . Paroxysmal a-fib 09/27/2011    Past Medical History  Diagnosis Date  . Hypertension   . High cholesterol   . Hyperlipidemia   . Angina   . Migraines     "til ~ 1980"  . Headache(784.0)     "recurring"  . Restless leg syndrome   . Weakness of right side of body     "I've had PT for it; they don't know what it's from".  CORTISONE INJECTION INTO BACK 08/30/12  . Diabetes mellitus     diet controlled/on meds  . Fibromyalgia   . Sciatic nerve pain     "from pinched nerve"  . Depression   . Atrial fibrillation     ASPIRIN FOR BLOOD THINNER  . Coronary artery disease   . GERD (gastroesophageal reflux disease)   . H/O hiatal hernia   . Complication of anesthesia     pt states has choking sensation with ET tube   . PONV (postoperative nausea and vomiting)   . Sleep apnea   . Arthritis   . Bulging discs     lumbar     Past Surgical History  Procedure Laterality Date  . Coronary artery bypass graft  2005    CABG X 2  . Coronary angioplasty with stent placement  06/2011    "1"  . Nasal septum surgery  ~ 1986  . Mouth surgery  2004    "bone replacement; had cadavear bones  put in; face was collapsing"  . Dilation and curettage of uterus      "more than once"  . Knee arthroscopy  09/04/2012    Procedure: ARTHROSCOPY KNEE;  Surgeon: Gearlean Alf, MD;  Location: [facility];  Service: Orthopedics;  Laterality: Right;  right knee arthroscopy with medial and lateral meniscus debridement  . Fracture surgery  ~ 2005    nose  . Total knee arthroplasty Right 07/07/2013    Procedure: RIGHT TOTAL KNEE ARTHROPLASTY;  Surgeon: Gearlean Alf, MD;  Location: [facility];  Service: Orthopedics;  Laterality: Right;    History   Social History  . Marital Status: Married    Spouse Name: N/A  . Number of  Children: N/A  . Years of Education: N/A   Occupational History  . Not on file.   Social History Main Topics  . Smoking status: Never Smoker   . Smokeless tobacco: Never Used  . Alcohol Use: 0.6 oz/week    1 Glasses of wine per week     Comment: "occasionally drink wine"  . Drug Use: No  . Sexual Activity: Yes   Other Topics Concern  . Not on file   Social History Narrative    Family History  Problem Relation Age of Onset  . Cancer Mother 68    breast cancer  . Breast cancer Mother   . Heart disease Father 78    sudden onset due to CAD  . Cancer Sister 101    colon  . Hypertension Sister   . Dementia Sister   . Diabetes Sister   . Colon cancer Sister   . GER disease Daughter   . Hypertension Daughter   . Heart disease Brother   . Hypertension Brother   . Heart attack Neg Hx   . Stroke Neg Hx     Allergies  Allergen Reactions  . Percocet [Oxycodone-Acetaminophen] Other (See Comments)    hallucination  . Sulfa Antibiotics Other (See Comments)    hallucinations    Medication list reviewed and updated in full in [facility].  GEN: No fevers, chills. Nontoxic. Primarily MSK c/o today. MSK: Detailed in the HPI GI: tolerating PO intake without difficulty Neuro: No numbness, parasthesias, or tingling associated. Otherwise the pertinent positives of the ROS are noted above.   Objective:   BP 114/70 mmHg  Pulse 74  Temp(Src) 97.7 F (36.5 C) (Oral)  Ht 5\' 3"  (1.6 m)  Wt 179 lb 8 oz (81.421 kg)  BMI 31.81 kg/m2   GEN: WDWN, NAD, Non-toxic, Alert & Oriented x 3 HEENT: Atraumatic, Normocephalic.  Ears and Nose: No external deformity. EXTR: No clubbing/cyanosis/edema NEURO: Normal gait.  PSYCH: Normally interactive. Conversant. Not depressed or anxious appearing.  Calm demeanor.    The patient's left elbow does not fully straighten at baseline.  There is no significant pain with terminal flexion or extension.  Rotational movements are relatively  preserved.  She does have some tenderness, relatively mild at the radial head and neck.  No significant tenderness along the entirety of the humerus.  There is no bruising.  Grip is preserved.  Radiology: Dg Elbow Complete Left  05/03/2015   CLINICAL DATA:  Elbow pain secondary to a fall 4 days ago.  EXAM: LEFT ELBOW - COMPLETE 3+ VIEW  COMPARISON:  Radiographs dated 04/29/2014  FINDINGS: The patient has developed deformity and posttraumatic arthropathy at the elbow joint from prior trauma. Old avulsion of the coronoid process. Old deformity of the radial head.  No acute osseous abnormalities. Old deformity of the proximal ulnar shaft consistent with prior healed fracture.  IMPRESSION: No acute abnormality.  Posttraumatic arthropathy and deformity.   Electronically Signed   By: Lorriane Shire M.D.   On: 05/03/2015 17:40     Assessment and Plan:   Left elbow pain - Plan: DG Elbow Complete Left  Traumatic arthritis elbow, left  Given the baseline abnormality of the patient's x-rays, I called Dr. Zigmund Daniel to discuss the case with him.  There are a lot of underlying chronic changes and arthritic changes posttrauma including the prior coronoid injury.  Both he and I agreed that all appeared to be chronic in nature without an acute occult fracture.  Recommended to the patient to wear a sling over the next week, remove the sling at least 5 times daily for range of motion exercises.  Ice the elbow no more than 20 minutes at a time.  Anti-inflammatories and Ultram as needed for pain.  If significant impairment and worsening pain occurs, urged to call us right away.  Much of this I think is a bone contusion with underlying traumatic arthritis of the left elbow with exacerbation.  New Prescriptions   DICLOFENAC (VOLTAREN) 75 MG EC TABLET    Take 1 tablet (75 mg total) by mouth 2 (two) times daily.   TRAMADOL (ULTRAM) 50 MG TABLET    Take 1 tablet (50 mg total) by mouth every 6 (six) hours as needed.    Orders Placed This Encounter  Procedures  . DG Elbow Complete Left    Signed,  Arienne Gartin T. Mechell Girgis, MD   Patient's Medications  New Prescriptions   DICLOFENAC (VOLTAREN) 75 MG EC TABLET    Take 1 tablet (75 mg total) by mouth 2 (two) times daily.   TRAMADOL (ULTRAM) 50 MG TABLET    Take 1 tablet (50 mg total) by mouth every 6 (six) hours as needed.  Previous Medications   AMOXICILLIN (AMOXIL) 500 MG CAPSULE    Take 500 mg by mouth as needed.   ASPIRIN 325 MG TABLET    Take 325 mg by mouth daily.   ATORVASTATIN (LIPITOR) 80 MG TABLET    TAKE 1 TABLET EVERY DAY   CETIRIZINE (ZYRTEC) 10 MG TABLET    Take 10 mg by mouth daily.   CLONAZEPAM (KLONOPIN) 1 MG TABLET    TAKE 2 TABLETS BY MOUTH AT BEDTIME AS NEEDED   EPINEPHRINE (EPI-PEN) 0.3 MG/0.3 ML SOAJ INJECTION    Inject into the muscle once as needed.   GABAPENTIN (NEURONTIN) 300 MG CAPSULE    TAKE 1 TO 2 CAPSULES BY MOUTH AT BEDTIME   GLUCOSE BLOOD (ONE TOUCH ULTRA TEST) TEST STRIP    Use to check blood sugar twice a day. DX: E11.9   HYDROCHLOROTHIAZIDE (HYDRODIURIL) 25 MG TABLET    TAKE 1 TABLET BY MOUTH DAILY   LIRAGLUTIDE (VICTOZA) 18 MG/3ML SOPN    Inject into the skin daily.    METFORMIN (FORTAMET) 1000 MG (OSM) 24 HR TABLET    TAKE 1 TABLET BY MOUTH EVERY DAY AS DIRECTED . IN COMBO WITH XR 500   METOPROLOL SUCCINATE (TOPROL-XL) 100 MG 24 HR TABLET    TAKE 1 TABLET BY MOUTH ONCE DAILY   MULTIPLE VITAMINS-MINERALS (PRESERVISION AREDS PO)    Take 1 tablet by mouth 2 (two) times daily.    NITROGLYCERIN (NITROSTAT) 0.4 MG SL TABLET    Place 0.4 mg under the tongue every 5 (five) minutes as needed. For chest  pain   ONETOUCH DELICA LANCETS 70J MISC    USED TO TEST BLOOD SUGAR ONCE DAILY DX: 250.00   PANTOPRAZOLE (PROTONIX) 20 MG TABLET    TAKE 2 TABLETS BY MOUTH TWICE A DAY   PROBIOTIC PRODUCT (PROBIOTIC DAILY PO)    Take 1 tablet by mouth 3 (three) times daily before meals.   VENLAFAXINE XR (EFFEXOR-XR) 37.5 MG 24 HR CAPSULE    TAKE  1 CAPSULE TWICE DAILY  Modified Medications   No medications on file  Discontinued Medications   ATORVASTATIN (LIPITOR) 80 MG TABLET    TAKE 1 TABLET EVERY DAY   PANTOPRAZOLE (PROTONIX) 20 MG TABLET    TAKE 2 TABLETS BY MOUTH TWICE A DAY

## 2015-05-03 NOTE — Progress Notes (Signed)
Pre visit review using our clinic review tool, if applicable. No additional management support is needed unless otherwise documented below in the visit note. 

## 2015-05-03 NOTE — Patient Instructions (Signed)
Sling for 1 week Go out of it at least 5 times a day for range of motion  Ice elbow 2-3 times a day for 20 minutes at a time.

## 2015-05-04 DIAGNOSIS — M12529 Traumatic arthropathy, unspecified elbow: Secondary | ICD-10-CM | POA: Insufficient documentation

## 2015-05-05 ENCOUNTER — Other Ambulatory Visit: Payer: Self-pay | Admitting: Family Medicine

## 2015-05-06 ENCOUNTER — Telehealth: Payer: Self-pay | Admitting: Family Medicine

## 2015-05-06 NOTE — Telephone Encounter (Signed)
Pt had xrays taken earlier this week. Pt is requesting that you fax the xrays to Dr Antony Blackbird office. Their fax number is [phone].  Toya Smothers

## 2015-05-07 ENCOUNTER — Telehealth: Payer: Self-pay | Admitting: Family Medicine

## 2015-05-07 DIAGNOSIS — M25529 Pain in unspecified elbow: Secondary | ICD-10-CM

## 2015-05-07 NOTE — Addendum Note (Signed)
Addended byEliezer Lofts E on: 05/07/2015 01:06 PM   Modules accepted: Orders

## 2015-05-07 NOTE — Telephone Encounter (Signed)
Patient called and said she was seen by Dr.Copland for elbow pain.  Patient said she's in excruciating pain and would like to be referred to [facility] today.  Patient is speaking to [facility] and Rosaria Ferries said she would make the appointment.  Rosaria Ferries is asking for a referral to be put in to Epic.

## 2015-05-07 NOTE — Telephone Encounter (Signed)
Faxed as requested

## 2015-05-10 ENCOUNTER — Other Ambulatory Visit: Payer: Self-pay | Admitting: Orthopedic Surgery

## 2015-05-10 DIAGNOSIS — M25521 Pain in right elbow: Secondary | ICD-10-CM

## 2015-05-12 ENCOUNTER — Other Ambulatory Visit: Payer: Self-pay | Admitting: Family Medicine

## 2015-05-14 ENCOUNTER — Other Ambulatory Visit: Payer: Self-pay | Admitting: Family Medicine

## 2015-05-14 ENCOUNTER — Ambulatory Visit
Admission: RE | Admit: 2015-05-14 | Discharge: 2015-05-14 | Disposition: A | Discharge status: Home / Self Care | Payer: Medicare Other | Source: Ambulatory Visit | Attending: Orthopedic Surgery | Admitting: Orthopedic Surgery

## 2015-05-14 ENCOUNTER — Other Ambulatory Visit: Payer: Self-pay | Admitting: Orthopedic Surgery

## 2015-05-14 DIAGNOSIS — M25521 Pain in right elbow: Secondary | ICD-10-CM

## 2015-05-14 DIAGNOSIS — M25522 Pain in left elbow: Secondary | ICD-10-CM

## 2015-05-19 ENCOUNTER — Encounter: Payer: Self-pay | Admitting: Cardiology

## 2015-05-22 ENCOUNTER — Other Ambulatory Visit: Payer: Self-pay | Admitting: Family Medicine

## 2015-05-23 ENCOUNTER — Other Ambulatory Visit: Payer: Self-pay | Admitting: Family Medicine

## 2015-05-23 NOTE — Telephone Encounter (Signed)
Last office visit 05/03/2015 with Dr. Lorelei Pont,   [place] scheduled 08/10/2015.  Last refilled 04/09/2015 for #60 with no refill.  Ok to refill?

## 2015-05-23 NOTE — Telephone Encounter (Signed)
Last office visit 05/03/2015 with Dr. Lorelei Pont,   [place] scheduled 08/10/2015.  Last refilled 04/26/2015 for #60 with 1 refill.  Ok to refill?

## 2015-05-24 NOTE — Telephone Encounter (Addendum)
Clonazepam called into [facility] [address] 

## 2015-05-27 ENCOUNTER — Other Ambulatory Visit: Payer: Self-pay | Admitting: Family Medicine

## 2015-05-27 MED ORDER — INSULIN PEN NEEDLE 32G X 6 MM MISC: Status: DC

## 2015-06-22 ENCOUNTER — Telehealth: Payer: Self-pay | Admitting: Cardiology

## 2015-06-22 NOTE — Telephone Encounter (Signed)
New Message  Pt @ last ov had to have chip from CPAP machine read in [place] not at our office. Pt wanted to know if she can now have her chip read @ [address]. Please call back and discuss.

## 2015-06-22 NOTE — Telephone Encounter (Signed)
Patients husband stated that her card is an SD card.  I told him that we should be able to pull the download here at the office

## 2015-06-25 ENCOUNTER — Other Ambulatory Visit: Payer: Self-pay | Admitting: Family Medicine

## 2015-06-25 NOTE — Telephone Encounter (Signed)
Rx called in to requested pharmacy 

## 2015-06-25 NOTE — Telephone Encounter (Signed)
Last f/u appt 03/2015 

## 2015-06-30 ENCOUNTER — Other Ambulatory Visit: Payer: Self-pay | Admitting: Orthopedic Surgery

## 2015-07-01 ENCOUNTER — Encounter (HOSPITAL_BASED_OUTPATIENT_CLINIC_OR_DEPARTMENT_OTHER): Payer: Self-pay | Admitting: *Deleted

## 2015-07-01 ENCOUNTER — Encounter (HOSPITAL_BASED_OUTPATIENT_CLINIC_OR_DEPARTMENT_OTHER)
Admission: RE | Admit: 2015-07-01 | Discharge: 2015-07-01 | Disposition: A | Discharge status: Home / Self Care | Payer: Medicare Other | Source: Ambulatory Visit | Attending: Orthopedic Surgery | Admitting: Orthopedic Surgery

## 2015-07-01 DIAGNOSIS — G2581 Restless legs syndrome: Secondary | ICD-10-CM | POA: Diagnosis not present

## 2015-07-01 DIAGNOSIS — M797 Fibromyalgia: Secondary | ICD-10-CM | POA: Diagnosis not present

## 2015-07-01 DIAGNOSIS — Z7982 Long term (current) use of aspirin: Secondary | ICD-10-CM | POA: Diagnosis not present

## 2015-07-01 DIAGNOSIS — R531 Weakness: Secondary | ICD-10-CM | POA: Diagnosis not present

## 2015-07-01 DIAGNOSIS — E785 Hyperlipidemia, unspecified: Secondary | ICD-10-CM | POA: Diagnosis not present

## 2015-07-01 DIAGNOSIS — Z79899 Other long term (current) drug therapy: Secondary | ICD-10-CM | POA: Diagnosis not present

## 2015-07-01 DIAGNOSIS — M19022 Primary osteoarthritis, left elbow: Secondary | ICD-10-CM | POA: Diagnosis not present

## 2015-07-01 DIAGNOSIS — Z885 Allergy status to narcotic agent status: Secondary | ICD-10-CM | POA: Diagnosis not present

## 2015-07-01 DIAGNOSIS — I4891 Unspecified atrial fibrillation: Secondary | ICD-10-CM | POA: Diagnosis not present

## 2015-07-01 DIAGNOSIS — G5602 Carpal tunnel syndrome, left upper limb: Secondary | ICD-10-CM | POA: Diagnosis not present

## 2015-07-01 DIAGNOSIS — Z882 Allergy status to sulfonamides status: Secondary | ICD-10-CM | POA: Diagnosis not present

## 2015-07-01 DIAGNOSIS — I10 Essential (primary) hypertension: Secondary | ICD-10-CM | POA: Diagnosis not present

## 2015-07-01 DIAGNOSIS — E780 Pure hypercholesterolemia: Secondary | ICD-10-CM | POA: Diagnosis not present

## 2015-07-01 DIAGNOSIS — E119 Type 2 diabetes mellitus without complications: Secondary | ICD-10-CM | POA: Diagnosis not present

## 2015-07-01 DIAGNOSIS — I2510 Atherosclerotic heart disease of native coronary artery without angina pectoris: Secondary | ICD-10-CM | POA: Diagnosis not present

## 2015-07-01 DIAGNOSIS — F329 Major depressive disorder, single episode, unspecified: Secondary | ICD-10-CM | POA: Diagnosis not present

## 2015-07-01 DIAGNOSIS — M5430 Sciatica, unspecified side: Secondary | ICD-10-CM | POA: Diagnosis not present

## 2015-07-01 DIAGNOSIS — M24022 Loose body in left elbow: Secondary | ICD-10-CM | POA: Diagnosis present

## 2015-07-01 DIAGNOSIS — K219 Gastro-esophageal reflux disease without esophagitis: Secondary | ICD-10-CM | POA: Diagnosis not present

## 2015-07-01 DIAGNOSIS — G4730 Sleep apnea, unspecified: Secondary | ICD-10-CM | POA: Diagnosis not present

## 2015-07-01 LAB — BASIC METABOLIC PANEL
Anion gap: 11 (ref 5–15)
BUN: 19 mg/dL (ref 6–20)
CHLORIDE: 100 mmol/L — AB (ref 101–111)
CO2: 28 mmol/L (ref 22–32)
CREATININE: 1.02 mg/dL — AB (ref 0.44–1.00)
Calcium: 9.5 mg/dL (ref 8.9–10.3)
GFR calc non Af Amer: 53 mL/min — ABNORMAL LOW (ref >60)
Glucose, Bld: 100 mg/dL — ABNORMAL HIGH (ref 65–99)
POTASSIUM: 4 mmol/L (ref 3.5–5.1)
Sodium: 139 mmol/L (ref 135–145)

## 2015-07-01 NOTE — H&P (Signed)
Morgan [name] is an 74 y.o. female.    Chief Complaint: Left hand pain and left elbow pain  HPI: Morgan [name] is here for followup of her left upper extremity EMG results.  A year ago she had an injury to her left elbow that may have left some loose bodies in her joint.  She then fell again in June of 2016 and CT scan showed 2 loose bodies in the left elbow. She also has elbow pain that is moderate and related to extending past 10 or flexing past 120. She reports an occasional catching sensation in the elbow since the fall.   Last month and we injected her left elbow and she does have some improvement in pain.    In addition to the left elbow pain she has numbness in her hand that was potentially carpal tunnel and/or cubital tunnel syndrome.  Her main symptom is been numbness and all of her fingers pain into the hand it wakes her up at night and is worse with driving a car or lifting.   She has been wearing her cockup left wrist splint during the day, but not at night.  The EMG performed by Dr. Mina Marble last month showed severe left carpal tunnel syndrome.  Past Medical History  Diagnosis Date  . Hypertension   . High cholesterol   . Hyperlipidemia   . Angina   . Migraines     "til ~ 1980"  . Headache(784.0)     "recurring"  . Restless leg syndrome   . Weakness of right side of body     "I've had PT for it; they don't know what it's from".  CORTISONE INJECTION INTO BACK 08/30/12  . Diabetes mellitus     diet controlled/on meds  . Fibromyalgia   . Sciatic nerve pain     "from pinched nerve"  . Depression   . Atrial fibrillation     ASPIRIN FOR BLOOD THINNER  . Coronary artery disease   . GERD (gastroesophageal reflux disease)   . H/O hiatal hernia   . Complication of anesthesia     pt states has choking sensation with ET tube   . PONV (postoperative nausea and vomiting)   . Arthritis   . Bulging discs     lumbar   . Sleep apnea     uses CPAP    Past Surgical History   Procedure Laterality Date  . Coronary artery bypass graft  2005    CABG X 2  . Coronary angioplasty with stent placement  06/2011    "1"  . Nasal septum surgery  ~ 1986  . 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 Dilation and curettage of uterus      "more than once"  . Knee arthroscopy  09/04/2012    Procedure: ARTHROSCOPY KNEE;  Surgeon: Gearlean Alf, MD;  Location: [facility];  Service: Orthopedics;  Laterality: Right;  right knee arthroscopy with medial and lateral meniscus debridement  . Fracture surgery  ~ 2005    nose  . Total knee arthroplasty Right 07/07/2013    Procedure: RIGHT TOTAL KNEE ARTHROPLASTY;  Surgeon: Gearlean Alf, MD;  Location: [facility];  Service: Orthopedics;  Laterality: Right;    Family History  Problem Relation Age of Onset  . Cancer Mother 25    breast cancer  . Breast cancer Mother   . Heart disease Father 75    sudden onset  due to CAD  . Cancer Sister 33    colon  . Hypertension Sister   . Dementia Sister   . Diabetes Sister   . Colon cancer Sister   . GER disease Daughter   . Hypertension Daughter   . Heart disease Brother   . Hypertension Brother   . Heart attack Neg Hx   . Stroke Neg Hx    Social History:  reports that she has never smoked. She has never used smokeless tobacco. She reports that she drinks about 0.6 oz of alcohol per week. She reports that she does not use illicit drugs.  Allergies:  Allergies  Allergen Reactions  . Hydrocodone-Acetaminophen Other (See Comments)    "Changed personality" "made me very mean"  . Percocet [Oxycodone-Acetaminophen] Other (See Comments)    hallucination  . Sulfa Antibiotics Other (See Comments)    hallucinations    No prescriptions prior to admission    Results for orders placed or performed during the hospital encounter of 07/02/15 (from the past 48 hour(s))  Basic metabolic panel     Status: Abnormal   Collection Time: 07/01/15   3:57 PM  Result Value Ref Range   Sodium 139 135 - 145 mmol/L   Potassium 4.0 3.5 - 5.1 mmol/L   Chloride 100 (L) 101 - 111 mmol/L   CO2 28 22 - 32 mmol/L   Glucose, Bld 100 (H) 65 - 99 mg/dL   BUN 19 6 - 20 mg/dL   Creatinine, Ser 1.02 (H) 0.44 - 1.00 mg/dL   Calcium 9.5 8.9 - 10.3 mg/dL   GFR calc non Af Amer 53 (L) >60 mL/min   GFR calc Af Amer >60 >60 mL/min    Comment: (NOTE) The eGFR has been calculated using the CKD EPI equation. This calculation has not been validated in all clinical situations. eGFR's persistently <60 mL/min signify possible Chronic Kidney Disease.    Anion gap 11 5 - 15   No results found.  Review of Systems  Constitutional: Negative.   HENT: Negative.   Eyes:       Poor vision  Cardiovascular: Negative.   Gastrointestinal: Positive for heartburn.  Genitourinary: Negative.   Musculoskeletal: Positive for joint pain.  Skin: Negative.   Neurological: Negative.   Endo/Heme/Allergies: Negative.   Psychiatric/Behavioral: Negative.     Height 5' 3.75" (1.619 m), weight 77.111 kg (170 lb). Physical Exam  Constitutional: She is oriented to person, place, and time. She appears well-developed and well-nourished.  HENT:  Head: Normocephalic and atraumatic.  Eyes: Pupils are equal, round, and reactive to light.  Neck: Normal range of motion. Neck supple.  Cardiovascular: Intact distal pulses.   Respiratory: Effort normal.  Musculoskeletal: She exhibits tenderness.  The left wrist has a positive Phalen's test, positive median nerve compression test and a 1+ Tinel's sign.  She can make a full composite fist with good adductors and abductors of the fingers and excellent grip strength.  She does have evidence of left thenar eminence wasting.  The left elbow itself is not swollen.  She is tender along the epicondyles and less so along the olecranon area.    Neurological: She is alert and oriented to person, place, and time.  Skin: Skin is warm and dry.   Psychiatric: She has a normal mood and affect. Her behavior is normal. Judgment and thought content normal.     Assessment/Plan Assess:  1.  Left severe carpal tunnel syndrome confirmed with EMG 2.  Left elbow osteoarthritis with  loose bodies that are probably old.  Plan: Patient with severe left carpal tunnel syndrome by EMG and mild thenar eminence wasting with minimal relief from cockup wrist splint.  We'll proceed with left carpal tunnel release.  Additionally we plan to perform a left elbow arthroscopy on the same day with hopes of retrieving several loose bodies seen on CT scan.  We will see her following the surgery.  PHILLIPS, ERIC R 07/01/2015, 5:37 PM

## 2015-07-02 ENCOUNTER — Encounter (HOSPITAL_BASED_OUTPATIENT_CLINIC_OR_DEPARTMENT_OTHER)
Admission: RE | Disposition: A | Discharge status: Home / Self Care | Payer: Self-pay | Source: Ambulatory Visit | Attending: Orthopedic Surgery

## 2015-07-02 ENCOUNTER — Ambulatory Visit (HOSPITAL_BASED_OUTPATIENT_CLINIC_OR_DEPARTMENT_OTHER): Payer: Medicare Other | Admitting: Anesthesiology

## 2015-07-02 ENCOUNTER — Encounter (HOSPITAL_BASED_OUTPATIENT_CLINIC_OR_DEPARTMENT_OTHER): Payer: Self-pay | Admitting: *Deleted

## 2015-07-02 ENCOUNTER — Ambulatory Visit (HOSPITAL_BASED_OUTPATIENT_CLINIC_OR_DEPARTMENT_OTHER)
Admission: RE | Admit: 2015-07-02 | Discharge: 2015-07-02 | Disposition: A | Discharge status: Home / Self Care | Payer: Medicare Other | Source: Ambulatory Visit | Attending: Orthopedic Surgery | Admitting: Orthopedic Surgery

## 2015-07-02 DIAGNOSIS — G2581 Restless legs syndrome: Secondary | ICD-10-CM | POA: Insufficient documentation

## 2015-07-02 DIAGNOSIS — Z885 Allergy status to narcotic agent status: Secondary | ICD-10-CM | POA: Insufficient documentation

## 2015-07-02 DIAGNOSIS — M24022 Loose body in left elbow: Secondary | ICD-10-CM | POA: Insufficient documentation

## 2015-07-02 DIAGNOSIS — G5602 Carpal tunnel syndrome, left upper limb: Secondary | ICD-10-CM | POA: Insufficient documentation

## 2015-07-02 DIAGNOSIS — E785 Hyperlipidemia, unspecified: Secondary | ICD-10-CM | POA: Insufficient documentation

## 2015-07-02 DIAGNOSIS — F329 Major depressive disorder, single episode, unspecified: Secondary | ICD-10-CM | POA: Insufficient documentation

## 2015-07-02 DIAGNOSIS — M5430 Sciatica, unspecified side: Secondary | ICD-10-CM | POA: Insufficient documentation

## 2015-07-02 DIAGNOSIS — M797 Fibromyalgia: Secondary | ICD-10-CM | POA: Insufficient documentation

## 2015-07-02 DIAGNOSIS — Z79899 Other long term (current) drug therapy: Secondary | ICD-10-CM | POA: Insufficient documentation

## 2015-07-02 DIAGNOSIS — Z7982 Long term (current) use of aspirin: Secondary | ICD-10-CM | POA: Insufficient documentation

## 2015-07-02 DIAGNOSIS — R531 Weakness: Secondary | ICD-10-CM | POA: Insufficient documentation

## 2015-07-02 DIAGNOSIS — M19022 Primary osteoarthritis, left elbow: Secondary | ICD-10-CM | POA: Diagnosis not present

## 2015-07-02 DIAGNOSIS — I4891 Unspecified atrial fibrillation: Secondary | ICD-10-CM | POA: Insufficient documentation

## 2015-07-02 DIAGNOSIS — K219 Gastro-esophageal reflux disease without esophagitis: Secondary | ICD-10-CM | POA: Insufficient documentation

## 2015-07-02 DIAGNOSIS — E780 Pure hypercholesterolemia: Secondary | ICD-10-CM | POA: Insufficient documentation

## 2015-07-02 DIAGNOSIS — G4730 Sleep apnea, unspecified: Secondary | ICD-10-CM | POA: Insufficient documentation

## 2015-07-02 DIAGNOSIS — Z882 Allergy status to sulfonamides status: Secondary | ICD-10-CM | POA: Insufficient documentation

## 2015-07-02 DIAGNOSIS — E119 Type 2 diabetes mellitus without complications: Secondary | ICD-10-CM | POA: Insufficient documentation

## 2015-07-02 DIAGNOSIS — I2510 Atherosclerotic heart disease of native coronary artery without angina pectoris: Secondary | ICD-10-CM | POA: Insufficient documentation

## 2015-07-02 DIAGNOSIS — I10 Essential (primary) hypertension: Secondary | ICD-10-CM | POA: Diagnosis not present

## 2015-07-02 HISTORY — PX: ELBOW ARTHROSCOPY: SHX614

## 2015-07-02 HISTORY — PX: CARPAL TUNNEL RELEASE: SHX101

## 2015-07-02 LAB — POCT HEMOGLOBIN-HEMACUE: Hemoglobin: 13.7 g/dL (ref 12.0–15.0)

## 2015-07-02 LAB — GLUCOSE, CAPILLARY
GLUCOSE-CAPILLARY: 100 mg/dL — AB (ref 65–99)
Glucose-Capillary: 115 mg/dL — ABNORMAL HIGH (ref 65–99)

## 2015-07-02 SURGERY — ARTHROSCOPY, ELBOW, WITH OPEN SURGERY IF INDICATED
Anesthesia: General | Site: Wrist | Laterality: Left

## 2015-07-02 MED ORDER — LACTATED RINGERS IV SOLN
INTRAVENOUS | Status: DC
  Administered 2015-07-02 (×2): via INTRAVENOUS

## 2015-07-02 MED ORDER — ONDANSETRON HCL 4 MG/2ML IJ SOLN
INTRAMUSCULAR | Status: DC | PRN
  Administered 2015-07-02: 4 mg via INTRAVENOUS

## 2015-07-02 MED ORDER — MIDAZOLAM HCL 2 MG/2ML IJ SOLN
INTRAMUSCULAR | Status: AC
Start: 2015-07-02 — End: 2015-07-02
  Filled 2015-07-02: qty 2

## 2015-07-02 MED ORDER — DEXTROSE-NACL 5-0.45 % IV SOLN: INTRAVENOUS | Status: DC

## 2015-07-02 MED ORDER — FENTANYL CITRATE (PF) 100 MCG/2ML IJ SOLN
50.0000 ug | INTRAMUSCULAR | Status: AC | PRN
  Administered 2015-07-02: 25 ug via INTRAVENOUS
  Administered 2015-07-02: 50 ug via INTRAVENOUS
  Administered 2015-07-02: 25 ug via INTRAVENOUS

## 2015-07-02 MED ORDER — FENTANYL CITRATE (PF) 100 MCG/2ML IJ SOLN
INTRAMUSCULAR | Status: AC
  Filled 2015-07-02: qty 4

## 2015-07-02 MED ORDER — CEFAZOLIN SODIUM-DEXTROSE 2-3 GM-% IV SOLR
INTRAVENOUS | Status: AC
  Filled 2015-07-02: qty 50

## 2015-07-02 MED ORDER — PROPOFOL 500 MG/50ML IV EMUL
INTRAVENOUS | Status: AC
  Filled 2015-07-02: qty 50

## 2015-07-02 MED ORDER — LIDOCAINE HCL (CARDIAC) 20 MG/ML IV SOLN
INTRAVENOUS | Status: DC | PRN
  Administered 2015-07-02: 50 mg via INTRAVENOUS

## 2015-07-02 MED ORDER — CHLORHEXIDINE GLUCONATE 4 % EX LIQD: 60.0000 mL | Freq: Once | CUTANEOUS | Status: DC

## 2015-07-02 MED ORDER — PROPOFOL 10 MG/ML IV BOLUS
INTRAVENOUS | Status: DC | PRN
  Administered 2015-07-02: 120 mg via INTRAVENOUS

## 2015-07-02 MED ORDER — HYDROMORPHONE HCL 1 MG/ML IJ SOLN
INTRAMUSCULAR | Status: AC
  Filled 2015-07-02: qty 1

## 2015-07-02 MED ORDER — HYDROMORPHONE HCL 1 MG/ML IJ SOLN
0.2500 mg | INTRAMUSCULAR | Status: DC | PRN
  Administered 2015-07-02: 0.5 mg via INTRAVENOUS

## 2015-07-02 MED ORDER — HYDROMORPHONE HCL 2 MG PO TABS: 1.0000 mg | ORAL_TABLET | ORAL | Status: DC | PRN

## 2015-07-02 MED ORDER — MIDAZOLAM HCL 2 MG/2ML IJ SOLN
1.0000 mg | INTRAMUSCULAR | Status: DC | PRN
  Administered 2015-07-02: 1 mg via INTRAVENOUS

## 2015-07-02 MED ORDER — SODIUM CHLORIDE 0.9 % IR SOLN
Status: DC | PRN
  Administered 2015-07-02: 1 mL

## 2015-07-02 MED ORDER — BUPIVACAINE HCL (PF) 0.5 % IJ SOLN
INTRAMUSCULAR | Status: AC
  Filled 2015-07-02: qty 30

## 2015-07-02 MED ORDER — BUPIVACAINE HCL (PF) 0.5 % IJ SOLN
INTRAMUSCULAR | Status: DC | PRN
  Administered 2015-07-02: 20 mL

## 2015-07-02 MED ORDER — SUCCINYLCHOLINE CHLORIDE 20 MG/ML IJ SOLN
INTRAMUSCULAR | Status: DC | PRN
  Administered 2015-07-02: 100 mg via INTRAVENOUS

## 2015-07-02 MED ORDER — GLYCOPYRROLATE 0.2 MG/ML IJ SOLN
0.2000 mg | Freq: Once | INTRAMUSCULAR | Status: AC | PRN
  Administered 2015-07-02: 0.2 mg via INTRAVENOUS

## 2015-07-02 MED ORDER — CEFAZOLIN SODIUM-DEXTROSE 2-3 GM-% IV SOLR
2.0000 g | INTRAVENOUS | Status: AC
  Administered 2015-07-02: 2 g via INTRAVENOUS

## 2015-07-02 MED ORDER — SCOPOLAMINE 1 MG/3DAYS TD PT72: 1.0000 | MEDICATED_PATCH | Freq: Once | TRANSDERMAL | Status: DC | PRN

## 2015-07-02 SURGICAL SUPPLY — 73 items
BANDAGE ELASTIC 3 VELCRO ST LF (GAUZE/BANDAGES/DRESSINGS) IMPLANT
BANDAGE ELASTIC 4 VELCRO ST LF (GAUZE/BANDAGES/DRESSINGS) ×3 IMPLANT
BLADE CUTTER GATOR 3.5 (BLADE) ×3 IMPLANT
BLADE SURG 11 STRL SS (BLADE) ×3 IMPLANT
BLADE SURG 15 STRL LF DISP TIS (BLADE) ×2 IMPLANT
BLADE SURG 15 STRL SS (BLADE) ×1
BNDG ELASTIC 2 VLCR STRL LF (GAUZE/BANDAGES/DRESSINGS) ×3 IMPLANT
BNDG ESMARK 4X9 LF (GAUZE/BANDAGES/DRESSINGS) ×3 IMPLANT
BUR CUDA 2.9 (BURR) ×3 IMPLANT
BUR FULL RADIUS 2.9 (BURR) IMPLANT
BUR GATOR 2.9 (BURR) IMPLANT
BUR SPHERICAL 2.9 (BURR) IMPLANT
CORDS BIPOLAR (ELECTRODE) ×3 IMPLANT
COVER BACK TABLE 60X90IN (DRAPES) ×3 IMPLANT
COVER MAYO STAND STRL (DRAPES) ×3 IMPLANT
CUFF TOURN SGL LL 18 NRW (TOURNIQUET CUFF) ×3 IMPLANT
CUFF TOURNIQUET SINGLE 18IN (TOURNIQUET CUFF) IMPLANT
DECANTER SPIKE VIAL GLASS SM (MISCELLANEOUS) IMPLANT
DRAPE EXTREMITY T 121X128X90 (DRAPE) ×3 IMPLANT
DRAPE U-SHAPE 47X51 STRL (DRAPES) ×3 IMPLANT
DURAPREP 26ML APPLICATOR (WOUND CARE) ×3 IMPLANT
ELECT MENISCUS 165MM 90D (ELECTRODE) IMPLANT
ELECT REM PT RETURN 9FT ADLT (ELECTROSURGICAL) ×3
ELECTRODE REM PT RTRN 9FT ADLT (ELECTROSURGICAL) ×2 IMPLANT
GAUZE XEROFORM 1X8 LF (GAUZE/BANDAGES/DRESSINGS) ×3 IMPLANT
GLOVE BIO SURGEON STRL SZ7.5 (GLOVE) ×3 IMPLANT
GLOVE BIO SURGEON STRL SZ8.5 (GLOVE) ×3 IMPLANT
GLOVE BIOGEL M STRL SZ7.5 (GLOVE) ×3 IMPLANT
GLOVE BIOGEL PI IND STRL 8 (GLOVE) ×4 IMPLANT
GLOVE BIOGEL PI IND STRL 9 (GLOVE) ×2 IMPLANT
GLOVE BIOGEL PI INDICATOR 8 (GLOVE) ×2
GLOVE BIOGEL PI INDICATOR 9 (GLOVE) ×1
GLOVE EXAM NITRILE MD LF STRL (GLOVE) ×3 IMPLANT
GOWN STRL REUS W/ TWL LRG LVL3 (GOWN DISPOSABLE) IMPLANT
GOWN STRL REUS W/TWL LRG LVL3 (GOWN DISPOSABLE)
GOWN STRL REUS W/TWL XL LVL3 (GOWN DISPOSABLE) ×9 IMPLANT
IV NS IRRIG 3000ML ARTHROMATIC (IV SOLUTION) ×3 IMPLANT
MANIFOLD NEPTUNE II (INSTRUMENTS) ×3 IMPLANT
NDL SAFETY ECLIPSE 18X1.5 (NEEDLE) ×4 IMPLANT
NEEDLE HYPO 18GX1.5 SHARP (NEEDLE) ×2
NEEDLE HYPO 22GX1.5 SAFETY (NEEDLE) IMPLANT
NEEDLE HYPO 25X1 1.5 SAFETY (NEEDLE) ×3 IMPLANT
NEEDLE SPNL 18GX3.5 QUINCKE PK (NEEDLE) ×3 IMPLANT
NS IRRIG 1000ML POUR BTL (IV SOLUTION) ×3 IMPLANT
PACK BASIN DAY SURGERY FS (CUSTOM PROCEDURE TRAY) ×3 IMPLANT
PAD CAST 3X4 CTTN HI CHSV (CAST SUPPLIES) IMPLANT
PAD CAST 4YDX4 CTTN HI CHSV (CAST SUPPLIES) ×2 IMPLANT
PADDING CAST ABS 3INX4YD NS (CAST SUPPLIES)
PADDING CAST ABS 4INX4YD NS (CAST SUPPLIES) ×1
PADDING CAST ABS COTTON 3X4 (CAST SUPPLIES) IMPLANT
PADDING CAST ABS COTTON 4X4 ST (CAST SUPPLIES) ×2 IMPLANT
PADDING CAST COTTON 3X4 STRL (CAST SUPPLIES)
PADDING CAST COTTON 4X4 STRL (CAST SUPPLIES) ×1
PADDING UNDERCAST 2 STRL (CAST SUPPLIES) ×1
PADDING UNDERCAST 2X4 STRL (CAST SUPPLIES) ×2 IMPLANT
PENCIL BUTTON HOLSTER BLD 10FT (ELECTRODE) IMPLANT
SET IRRIG Y TYPE TUR BLADDER L (SET/KITS/TRAYS/PACK) ×3 IMPLANT
SHEET MEDIUM DRAPE 40X70 STRL (DRAPES) ×3 IMPLANT
SLEEVE SCD COMPRESS KNEE MED (MISCELLANEOUS) ×3 IMPLANT
SLING ARM FOAM STRAP LRG (SOFTGOODS) ×3 IMPLANT
SPONGE GAUZE 4X4 12PLY STER LF (GAUZE/BANDAGES/DRESSINGS) ×6 IMPLANT
SUT ETHILON 4 0 PS 2 18 (SUTURE) ×3 IMPLANT
SUT ETHILON 5 0 PS 2 18 (SUTURE) IMPLANT
SUT MON AB 4-0 PC3 18 (SUTURE) IMPLANT
SUT VIC AB 2-0 SH 27 (SUTURE)
SUT VIC AB 2-0 SH 27XBRD (SUTURE) IMPLANT
SYR 5ML LL (SYRINGE) ×3 IMPLANT
SYR BULB 3OZ (MISCELLANEOUS) ×3 IMPLANT
SYR CONTROL 10ML LL (SYRINGE) ×3 IMPLANT
TOWEL OR 17X24 6PK STRL BLUE (TOWEL DISPOSABLE) ×6 IMPLANT
TUBE CONNECTING 20X1/4 (TUBING) ×3 IMPLANT
UNDERPAD 30X30 (UNDERPADS AND DIAPERS) ×3 IMPLANT
WATER STERILE IRR 1000ML POUR (IV SOLUTION) ×3 IMPLANT

## 2015-07-02 NOTE — Discharge Instructions (Signed)
Carpal Tunnel Release (Repair), Care After Refer to this sheet in the next few weeks. These discharge instructions provide you with general information on caring for yourself after you leave the hospital. Your caregiver may also give you specific instructions. Your treatment has been planned according to the most current medical practices available, but unavoidable complications sometimes occur. If you have any problems or questions after discharge, please call your caregiver. HOME CARE INSTRUCTIONS   Have a responsible person with you for 24 hours.  Do not drive a car or take public transportation for 24 hours.  Only take over-the-counter or prescription medicines for pain, discomfort, or fever as directed by your caregiver. Take them as directed.  You may put ice on the palm side of the affected wrist.  Put ice in a plastic bag.  Place a towel between your skin and the bag.  Leave the ice on for 15-20 minutes, 03-04 times per day.  If you were given a splint to keep your wrist from bending, use it as directed. It is important to wear the splint at night or as directed. Use the splint for as long as you have pain or numbness in your hand, arm, or wrist. This may take 1 to 2 months.  Keep your hand raised (elevated) above the level of your heart as much as possible. This keeps swelling down and helps with discomfort.  Change bandages (dressings) as directed.  Keep the wound clean and dry. SEEK MEDICAL CARE IF:   You develop pain not relieved with medicines.  You develop numbness of your hand.  You develop bleeding from your surgical site.  You have an oral temperature above 102 F (38.9 C).  You develop redness or swelling of the surgical site.  You develop new, unexplained problems. SEEK IMMEDIATE MEDICAL CARE IF:   You develop a rash.  You have difficulty breathing.  You develop any reaction or side effects to medicines given. MAKE SURE YOU:   Understand these  instructions.  Will watch your condition.  Will get help right away if you are not doing well or get worse. Document Released: 05/19/2005 Document Revised: 08/20/2013 Document Reviewed: 09/05/2007 [facility] Patient Information 2015 [place], [place]. This information is not intended to replace advice given to you by your health care provider. Make sure you discuss any questions you have with your health care provider.    Post Anesthesia Home Care Instructions  Activity: Get plenty of rest for the remainder of the day. A responsible adult should stay with you for 24 hours following the procedure.  For the next 24 hours, DO NOT: -Drive a car -Paediatric nurse -Drink alcoholic beverages -Take any medication unless instructed by your physician -Make any legal decisions or sign important papers.  Meals: Start with liquid foods such as gelatin or soup. Progress to regular foods as tolerated. Avoid greasy, spicy, heavy foods. If nausea and/or vomiting occur, drink only clear liquids until the nausea and/or vomiting subsides. Call your physician if vomiting continues.  Special Instructions/Symptoms: Your throat may feel dry or sore from the anesthesia or the breathing tube placed in your throat during surgery. If this causes discomfort, gargle with warm salt water. The discomfort should disappear within 24 hours.  If you had a scopolamine patch placed behind your ear for the management of post- operative nausea and/or vomiting:  1. The medication in the patch is effective for 72 hours, after which it should be removed.  Wrap patch in a tissue and discard  in the trash. Wash hands thoroughly with soap and water. 2. You may remove the patch earlier than 72 hours if you experience unpleasant side effects which may include dry mouth, dizziness or visual disturbances. 3. Avoid touching the patch. Wash your hands with soap and water after contact with the patch.

## 2015-07-02 NOTE — Anesthesia Procedure Notes (Signed)
Procedure Name: Intubation Date/Time: 07/02/2015 8:06 AM Performed by: Lyndee Leo Pre-anesthesia Checklist: Patient identified, Emergency Drugs available, Suction available and Patient being monitored Patient Re-evaluated:Patient Re-evaluated prior to inductionOxygen Delivery Method: Circle System Utilized Preoxygenation: Pre-oxygenation with 100% oxygen Intubation Type: IV induction Ventilation: Mask ventilation without difficulty Laryngoscope Size: Mac and 3 Grade View: Grade III Tube type: Oral Tube size: 7.0 mm Number of attempts: 1 Airway Equipment and Method: Stylet and Oral airway Placement Confirmation: ETT inserted through vocal cords under direct vision,  positive ETCO2 and breath sounds checked- equal and bilateral Secured at: 20 cm Tube secured with: Tape Dental Injury: Teeth and Oropharynx as per pre-operative assessment

## 2015-07-02 NOTE — Transfer of Care (Signed)
Immediate Anesthesia Transfer of Care Note  Patient: Morgan [name]  Procedure(s) Performed: Procedure(s): ARTHROSCOPY LEFT ELBOW WITH DEBRIDEMENT AND REMOVAL LOOSE BODY (Left) CARPAL TUNNEL RELEASE (Left)  Patient Location: PACU  Anesthesia Type:General  Level of Consciousness: awake, sedated and patient cooperative  Airway & Oxygen Therapy: Patient Spontanous Breathing and Patient connected to face mask oxygen  Post-op Assessment: Report given to RN and Post -op Vital signs reviewed and stable  Post vital signs: Reviewed and stable  Last Vitals:  Filed Vitals:   07/02/15 0722  BP: 141/56  Pulse: 63  Temp: 36.5 C  Resp: 18    Complications: No apparent anesthesia complications

## 2015-07-02 NOTE — Anesthesia Postprocedure Evaluation (Signed)
  Anesthesia Post-op Note  Patient: Physiological scientist  Procedure(s) Performed: Procedure(s): ARTHROSCOPY LEFT ELBOW WITH DEBRIDEMENT AND REMOVAL LOOSE BODY (Left) CARPAL TUNNEL RELEASE (Left)  Patient Location: PACU  Anesthesia Type:General  Level of Consciousness: awake, alert  and oriented  Airway and Oxygen Therapy: Patient Spontanous Breathing  Post-op Pain: mild  Post-op Assessment: Post-op Vital signs reviewed and Patient's Cardiovascular Status Stable              Post-op Vital Signs: Reviewed  Last Vitals:  Filed Vitals:   07/02/15 1130  BP: 131/68  Pulse: 69  Temp:   Resp: 12    Complications: No apparent anesthesia complications

## 2015-07-02 NOTE — Anesthesia Preprocedure Evaluation (Addendum)
Anesthesia Evaluation  Patient identified by MRN, date of birth, ID band Patient awake    Reviewed: Allergy & Precautions, H&P , NPO status , Patient's Chart, lab work & pertinent test results, reviewed documented beta blocker date and time   History of Anesthesia Complications (+) PONV  Airway Mallampati: III  TM Distance: >3 FB Neck ROM: Full    Dental no notable dental hx. (+) Teeth Intact, Dental Advisory Given   Pulmonary sleep apnea and Continuous Positive Airway Pressure Ventilation ,  breath sounds clear to auscultation  Pulmonary exam normal       Cardiovascular hypertension, Pt. on medications and Pt. on home beta blockers + CAD Rhythm:Regular Rate:Normal     Neuro/Psych  Headaches, Depression TIA   GI/Hepatic Neg liver ROS, GERD-  Medicated and Controlled,  Endo/Other  diabetes, Type 2, Oral Hypoglycemic Agents  Renal/GU negative Renal ROS  negative genitourinary   Musculoskeletal  (+) Arthritis -, Osteoarthritis,  Fibromyalgia -  Abdominal   Peds negative pediatric ROS (+)  Hematology negative hematology ROS (+)   Anesthesia Other Findings   Reproductive/Obstetrics negative OB ROS                            Anesthesia Physical Anesthesia Plan  ASA: III  Anesthesia Plan: General   Post-op Pain Management:    Induction: Intravenous  Airway Management Planned: LMA  Additional Equipment:   Intra-op Plan:   Post-operative Plan: Extubation in OR  Informed Consent: I have reviewed the patients History and Physical, chart, labs and discussed the procedure including the risks, benefits and alternatives for the proposed anesthesia with the patient or authorized representative who has indicated his/her understanding and acceptance.   Dental advisory given  Plan Discussed with: CRNA  Anesthesia Plan Comments:         Anesthesia Quick Evaluation

## 2015-07-02 NOTE — Op Note (Signed)
Pre Op Dx: L Elbow Loose Body, L CTS  Post Op Dx: Same  Procedure: Left elbow arthroscopic removal of 8 mm diameter loose body from the olecranon fossa, left carpal tunnel release  Surgeon: Kerin Salen, MD  Assistant: Kerry Hough. Barton Dubois  (present throughout entire procedure and necessary for timely completion of the procedure)  Anesthesia: General  EBL: Minimal  Fluids: 1000 mL crystalloid  Tourniquet Time: Tree minutes  Indications: Patient has severe left carpal tunnel syndrome diagnosed by recent EMGs as some opponents atrophy and is starting to drop objects. She desires elective left carpal tunnel release to alleviate symptoms and prevent further erosion of opponents power. She's also been treated for left elbow pain and has an 38mm diameter loose body in the olecranon fossa confirmed by CT scan and a possible smaller loose body anteriorly. She desires elective arthroscopic evaluation and treatment of the left elbow risks and benefits of surgery been discussed questions answered the elbow does have some locking and catching sensations.  Procedure: Patient was identified by arm band receive preoperative IV antibody some the holding area [facility]. Taken to operating room to Prilosec monitors were attached and general endotracheal anesthesia induced with the patient in the supine position. She was then rolled into the right lateral decubitus position and held there with a beanbag a tourniquet was applied high to the left arm and a C-shaped arm holding device was applied to keep the arm forward flexed and allow the elbow to bend 90 for the prepping and draping of the left upper extremity from the fingertips to the arm holder. Timeout procedure was performed with began the operation by entering the elbow joint from the lateral portal just anterior to the radial head capitellum junction with an 18-gauge needle and infiltrating 10 mL of Xylocaine. We then made a small portal and  entered the elbow with the elbow scope with gravity pressure for insufflation. Under arthroscopic control a medial portal was made. The radial head was in good condition as was the capitellum there was some chondromalacia of the trochlea was lightly debrided with a 3.5 Gator sucker shaver, entering from the medial side. we did not encounter any intra-articular loose body per se in the anterior compartment. We then made posterior medial and lateral portals just proximal to the olecranon and entered the olecranon fossa and immediately identified a large 23mm diameter peripheral shaped loose body. The posterior lateral portal was enlarged to 7 mm and the loose body was grasped with a hemostat and removed. The scope was then taken down the medial and lateral gutters of the joint between the olecranon and the trochlea and no further loose findings were encountered. From the posterior lateral portal we're unable to visualize the backside of the radial head and was also in good condition. At this time the arthroscopic instance removed and we performed a carpal tunnel release  With the arm pronated and on a Mayo stand the limb was wrapped with an Esmarch bandage tourniquet inflated to 300 mmHg and we performed a carpal tunnel release by making a 4 cm incision starting at the wrist flexion crease just to the ulnar-sided the thenar crease to the skin and subcutaneous tissue. With traction applied either side of the wound we then entered the carpal tunnel with a 15 blade under traction and identified the median nerve. We then performed a carpal tunnel release going proximally to the wrist flexion crease and extended up into the forearm with the tenotomy  scissors. We examine the median nerve did have an hourglass configuration is 1 underwent ligament. The tendons were intact and there are no ganglia and the carpal tunnel. Tourniquet was let down small bleeders were identified and cauterized with the bipolar. The wound is  irrigated with normal saline solution and closed with running 4-0 nylon suture. On the wrist a dressing of Xerofoam 4 x 4's and a 2 inch Ace wrap was applied on the elbow the portal was used for removal of loose body received a single 4-0 nylon suture and then the other portals were dressed with Xerofoam 4 x 4 dressing sponges and a three-inch Ace wrap followed by a sling. The patient was then rolled supine awakened extubated and taken to the recovery without difficulty.

## 2015-07-06 ENCOUNTER — Encounter (HOSPITAL_BASED_OUTPATIENT_CLINIC_OR_DEPARTMENT_OTHER): Payer: Self-pay | Admitting: Orthopedic Surgery

## 2015-07-15 ENCOUNTER — Encounter: Payer: Self-pay | Admitting: Cardiology

## 2015-07-17 ENCOUNTER — Other Ambulatory Visit: Payer: Self-pay | Admitting: Family Medicine

## 2015-07-17 ENCOUNTER — Other Ambulatory Visit: Payer: Self-pay | Admitting: Cardiology

## 2015-07-21 ENCOUNTER — Other Ambulatory Visit: Payer: Self-pay | Admitting: *Deleted

## 2015-07-21 MED ORDER — VENLAFAXINE HCL ER 37.5 MG PO CP24: 37.5000 mg | ORAL_CAPSULE | Freq: Two times a day (BID) | ORAL | Status: DC

## 2015-07-21 MED ORDER — METFORMIN HCL ER (OSM) 1000 MG PO TB24: ORAL_TABLET | ORAL | Status: DC

## 2015-07-22 ENCOUNTER — Other Ambulatory Visit: Payer: Self-pay | Admitting: Family Medicine

## 2015-07-26 ENCOUNTER — Ambulatory Visit (INDEPENDENT_AMBULATORY_CARE_PROVIDER_SITE_OTHER): Payer: Medicare Other | Admitting: Podiatry

## 2015-07-26 DIAGNOSIS — M79676 Pain in unspecified toe(s): Secondary | ICD-10-CM | POA: Diagnosis not present

## 2015-07-26 DIAGNOSIS — B351 Tinea unguium: Secondary | ICD-10-CM | POA: Diagnosis not present

## 2015-07-26 NOTE — Progress Notes (Signed)
Patient ID: Morgan [name], female   DOB: May 10, 1941, 74 y.o.   MRN: [id number]  Subjective: 74 y.o.-year-old female returns the office today for painful, elongated, thickened toenails which she is unable to trim herself. Denies any redness or drainage around the nails. Denies any acute changes since last appointment and no new complaints today. Denies any systemic complaints such as fevers, chills, nausea, vomiting.   Objective: AAO 3, NAD DP/PT pulses palpable, CRT less than 3 seconds Protective sensation intact with Simms Weinstein monofilament, Achilles tendon reflex intact.  Nails hypertrophic, dystrophic, elongated, brittle, discolored 10. There is tenderness overlying the nails 1-5 bilaterally. There is no surrounding erythema or drainage along the nail sites. No open lesions or pre-ulcerative lesions are identified. No other areas of tenderness bilateral lower extremities. No overlying edema, erythema, increased warmth. No pain with calf compression, swelling, warmth, erythema.  Assessment: Patient presents with symptomatic onychomycosis  Plan: -Treatment options including alternatives, risks, complications were discussed -Nails sharply debrided 10 without complication/bleeding. -Discussed daily foot inspection. If there are any changes, to call the office immediately.  -Follow-up in 3 months or sooner if any problems are to arise. In the meantime, encouraged to call the office with any questions, concerns, changes symptoms. 3 mo.  Roselind Messier DPM

## 2015-08-03 ENCOUNTER — Other Ambulatory Visit (INDEPENDENT_AMBULATORY_CARE_PROVIDER_SITE_OTHER): Payer: Medicare Other

## 2015-08-03 ENCOUNTER — Telehealth: Payer: Self-pay | Admitting: Family Medicine

## 2015-08-03 ENCOUNTER — Encounter: Payer: Self-pay | Admitting: Radiology

## 2015-08-03 DIAGNOSIS — E7800 Pure hypercholesterolemia, unspecified: Secondary | ICD-10-CM

## 2015-08-03 DIAGNOSIS — E119 Type 2 diabetes mellitus without complications: Secondary | ICD-10-CM

## 2015-08-03 DIAGNOSIS — M8580 Other specified disorders of bone density and structure, unspecified site: Secondary | ICD-10-CM

## 2015-08-03 DIAGNOSIS — M859 Disorder of bone density and structure, unspecified: Secondary | ICD-10-CM | POA: Diagnosis not present

## 2015-08-03 LAB — LIPID PANEL
CHOLESTEROL: 119 mg/dL (ref 0–200)
HDL: 49.6 mg/dL (ref >39.00)
LDL Cholesterol: 56 mg/dL (ref 0–99)
NonHDL: 69.36
Total CHOL/HDL Ratio: 2
Triglycerides: 68 mg/dL (ref 0.0–149.0)
VLDL: 13.6 mg/dL (ref 0.0–40.0)

## 2015-08-03 LAB — COMPREHENSIVE METABOLIC PANEL
ALBUMIN: 3.9 g/dL (ref 3.5–5.2)
ALT: 24 U/L (ref 0–35)
AST: 19 U/L (ref 0–37)
Alkaline Phosphatase: 77 U/L (ref 39–117)
BILIRUBIN TOTAL: 0.5 mg/dL (ref 0.2–1.2)
BUN: 25 mg/dL — ABNORMAL HIGH (ref 6–23)
CALCIUM: 9.9 mg/dL (ref 8.4–10.5)
CO2: 33 mEq/L — ABNORMAL HIGH (ref 19–32)
CREATININE: 1 mg/dL (ref 0.40–1.20)
Chloride: 101 mEq/L (ref 96–112)
GFR: 57.71 mL/min — ABNORMAL LOW (ref >60.00)
Glucose, Bld: 122 mg/dL — ABNORMAL HIGH (ref 70–99)
Potassium: 4.5 mEq/L (ref 3.5–5.1)
Sodium: 141 mEq/L (ref 135–145)
TOTAL PROTEIN: 6.7 g/dL (ref 6.0–8.3)

## 2015-08-03 LAB — MICROALBUMIN / CREATININE URINE RATIO
Creatinine,U: 134.6 mg/dL
MICROALB/CREAT RATIO: 0.5 mg/g (ref 0.0–30.0)

## 2015-08-03 LAB — VITAMIN D 25 HYDROXY (VIT D DEFICIENCY, FRACTURES): VITD: 54.86 ng/mL (ref 30.00–100.00)

## 2015-08-03 LAB — HEMOGLOBIN A1C: HEMOGLOBIN A1C: 6.9 % — AB (ref 4.6–6.5)

## 2015-08-03 NOTE — Telephone Encounter (Signed)
-----   Message from Ellamae Sia sent at 07/29/2015 10:49 AM EDT ----- Regarding: Lab orders for Tuesday, 9.20.16 Patient is scheduled for CPX labs, please order future labs, Thanks , Karna Christmas

## 2015-08-10 ENCOUNTER — Ambulatory Visit (INDEPENDENT_AMBULATORY_CARE_PROVIDER_SITE_OTHER): Payer: Medicare Other | Admitting: Family Medicine

## 2015-08-10 ENCOUNTER — Encounter: Payer: Self-pay | Admitting: Family Medicine

## 2015-08-10 ENCOUNTER — Other Ambulatory Visit: Payer: Self-pay | Admitting: Family Medicine

## 2015-08-10 VITALS — BP 110/62 | HR 66 | Temp 98.2°F | Ht 62.5 in | Wt 176.2 lb

## 2015-08-10 DIAGNOSIS — E119 Type 2 diabetes mellitus without complications: Secondary | ICD-10-CM | POA: Diagnosis not present

## 2015-08-10 DIAGNOSIS — Z23 Encounter for immunization: Secondary | ICD-10-CM | POA: Diagnosis not present

## 2015-08-10 DIAGNOSIS — Z0000 Encounter for general adult medical examination without abnormal findings: Secondary | ICD-10-CM

## 2015-08-10 DIAGNOSIS — F324 Major depressive disorder, single episode, in partial remission: Secondary | ICD-10-CM | POA: Diagnosis not present

## 2015-08-10 NOTE — Progress Notes (Signed)
Pre visit review using our clinic review tool, if applicable. No additional management support is needed unless otherwise documented below in the visit note. 

## 2015-08-10 NOTE — Telephone Encounter (Signed)
Last office visit 08/10/2015.  Last refilled 06/25/2015 for #[id number] with no refills. Ok to refill?

## 2015-08-10 NOTE — Progress Notes (Signed)
BP Readings from Last 3 Encounters:  08/10/15 110/62  07/02/15 119/53  05/03/15 114/70  I have personally reviewed the Medicare Annual Wellness questionnaire and have noted 1. The patient's medical and social history 2. Their use of alcohol, tobacco or illicit drugs 3. Their current medications and supplements 4. The patient's functional ability including ADL's, fall risks, home safety risks and hearing or visual             impairment. 5. Diet and physical activities 6. Evidence for depression or mood disorders 7.         Updated provider list Cognitive evaluation was performed and recorded on pt medicare questionnaire form. The patients weight, height, BMI and visual acuity have been recorded in the chart  I have made referrals, counseling and provided education to the patient based review of the above and I have provided the pt with a written personalized care plan for preventive services.   She has had bone chips removed from elbows and left carpal tunnel surgery. She has had improvement in pain.             Wt Readings from Last 3 Encounters:  08/10/15 176 lb 4 oz (79.946 kg)  07/02/15 174 lb 6 oz (79.096 kg)  05/03/15 179 lb 8 oz (81.421 kg)     Depression: well controlled on venlafaxine.  Diabetes: Good control on  metformin, victoza 1.2 mcg daily Lab Results  Component Value Date   HGBA1C 6.9* 08/03/2015   Using medications without difficulties:occ diarrhea on this lower dose. Hypoglycemic episodes:None Hyperglycemic episodes:None Feet problems:None, thickened toenails Blood Sugars averaging: FBS 130-190, 2 hours meals: not checking eye exam within last year: yes She has not been exercising, trying to work on diet.   She is at goal LDL < 70 on lipitor 80 mg daily given CAD, TIA history.  Lab Results  Component Value Date   CHOL 119 08/03/2015   HDL 49.60 08/03/2015   LDLCALC 56 08/03/2015   TRIG 68.0 08/03/2015   CHOLHDL 2 08/03/2015   She was on  plavix for 1 year after stent. Stopped due to easy bruising. No stomach bleeding, remote hx of ulcer 30 years ago.   Review of Systems  Constitutional: Negative for fever and fatigue.  HENT: Negative for ear pain.  Eyes: Negative for pain.  Respiratory: Negative for chest tightness and shortness of breath.  Cardiovascular: Negative for chest pain, palpitations and leg swelling.  Gastrointestinal: Negative for abdominal pain.  She has episodes of gas and violent diarrhea, no blood in stool.  On going for 1 year. Always at night. Probiotic has not helped. Reflux off and on. 2-3 times a month. She is on protonix 40 mg daily. Genitourinary: Negative for dysuria.  Objective:   Physical Exam  Constitutional: She is oriented to person, place, and time. Vital signs are normal. She appears well-developed and well-nourished. She is cooperative. Non-toxic appearance. She does not appear ill. No distress.  HENT:  Head: Normocephalic.  Right Ear: Hearing, tympanic membrane, external ear and ear canal normal. Tympanic membrane is not erythematous, not retracted and not bulging.  Left Ear: Hearing, tympanic membrane, external ear and ear canal normal. Tympanic membrane is not erythematous, not retracted and not bulging.  Nose: No mucosal edema or rhinorrhea. Right sinus exhibits no maxillary sinus tenderness and no frontal sinus tenderness. Left sinus exhibits no maxillary sinus tenderness and no frontal sinus tenderness.  Mouth/Throat: Uvula is midline, oropharynx is clear and moist and mucous membranes  are normal.  Eyes: Conjunctivae, EOM and lids are normal. Pupils are equal, round, and reactive to light. Lids are everted and swept, no foreign bodies found.  Neck: Trachea normal and normal range of motion. Neck supple. Carotid bruit is not present. No mass and no thyromegaly present.  Cardiovascular: Normal rate, regular rhythm, S1 normal, S2 normal, normal heart sounds, intact distal pulses  and normal pulses. Exam reveals no gallop and no friction rub.  No murmur heard.  Pulmonary/Chest: Effort normal and breath sounds normal. Not tachypneic. No respiratory distress. She has no decreased breath sounds. She has no wheezes. She has no rhonchi. She has no rales.  Abdominal: Soft. Normal appearance and bowel sounds are normal. There is no tenderness.  Neurological: She is alert and oriented to person, place, and time. She has normal strength and normal reflexes. No cranial nerve deficit or sensory deficit. She exhibits normal muscle tone. She displays a negative Romberg sign. Coordination and gait normal. GCS eye subscore is 4. GCS verbal subscore is 5. GCS motor subscore is 6.  Nml cerebellar exam  No papilledema  Skin: Skin is warm, dry and intact. No rash noted.  Psychiatric: She has a normal mood and affect. Her speech is normal and behavior is normal. Judgment and thought content normal. Her mood appears not anxious. Cognition and memory are normal. Cognition and memory are not impaired. She does not exhibit a depressed mood. She exhibits normal recent memory and normal remote memory.   Diabetic foot exam: Normal inspection No skin breakdown No calluses  Normal DP pulses Normal sensation to light touch and monofilament Nails thickened  Assessment and PLAN:  The patient's preventative maintenance and recommended screening tests for an annual wellness exam were reviewed in full today. Brought up to date unless services declined.  Counselled on the importance of diet, exercise, and its role in overall health and mortality. The patient's FH and SH was reviewed, including their home life, tobacco status, and drug and alcohol status.   Vaccines: due for flu  COLON:  last 2015 . Sister with strong family history colon cancer. Mammo: 04/2014 nml DVE/pap: no pap indicated, plan q2 year DVE. DEXA: 09/2014 osteopenia, repeat 5 years

## 2015-08-10 NOTE — Patient Instructions (Addendum)
Work on adding exercise when able.  Call Dr. Deatra Ina for  GI visit  for diarrhea and GERD.

## 2015-08-12 NOTE — Telephone Encounter (Signed)
Clonazepam called into [facility] [address] 

## 2015-08-16 ENCOUNTER — Telehealth: Payer: Self-pay | Admitting: Gastroenterology

## 2015-08-16 NOTE — Telephone Encounter (Signed)
Spoke with the patient and offered her an appointment with Bernette Redbird., PA for evaluation of her diarrhea and GERD. She reports she just saw her PCP and was advised to schedule this appointment. She states she has been treated for this in the past and this is not a new issue.

## 2015-08-18 ENCOUNTER — Ambulatory Visit: Payer: Medicare Other | Admitting: Cardiology

## 2015-08-23 ENCOUNTER — Encounter: Payer: Self-pay | Admitting: Family Medicine

## 2015-08-24 NOTE — Assessment & Plan Note (Signed)
Stable control on venlafaxine

## 2015-08-24 NOTE — Assessment & Plan Note (Signed)
Well controlled. Continue current medication. Encouraged exercise, weight loss, healthy eating habits.  

## 2015-08-30 ENCOUNTER — Ambulatory Visit (INDEPENDENT_AMBULATORY_CARE_PROVIDER_SITE_OTHER): Payer: Medicare Other | Admitting: Physician Assistant

## 2015-08-30 VITALS — BP 126/82 | HR 70 | Ht 62.5 in | Wt 179.0 lb

## 2015-08-30 DIAGNOSIS — R110 Nausea: Secondary | ICD-10-CM | POA: Diagnosis not present

## 2015-08-30 DIAGNOSIS — K219 Gastro-esophageal reflux disease without esophagitis: Secondary | ICD-10-CM | POA: Diagnosis not present

## 2015-08-30 DIAGNOSIS — R1013 Epigastric pain: Secondary | ICD-10-CM | POA: Diagnosis not present

## 2015-08-30 DIAGNOSIS — K580 Irritable bowel syndrome with diarrhea: Secondary | ICD-10-CM | POA: Diagnosis not present

## 2015-08-30 MED ORDER — RIFAXIMIN 550 MG PO TABS: 550.0000 mg | ORAL_TABLET | Freq: Three times a day (TID) | ORAL | Status: DC

## 2015-08-30 NOTE — Patient Instructions (Addendum)
You have been scheduled for a gastric emptying scan at [facility] on Tuesday 09/07/15 at 8:30 am. Please arrive at least 15 minutes prior to your appointment for registration. Please make certain not to have anything to eat or drink after midnight the night before your test. Hold all stomach medications (ex: Zofran, phenergan, Reglan) 48 hours prior to your test. If you need to reschedule your appointment, please contact radiology scheduling at [phone]. _____________________________________________________________________ A gastric-emptying study measures how long it takes for food to move through your stomach. There are several ways to measure stomach emptying. In the most common test, you eat food that contains a small amount of radioactive material. A scanner that detects the movement of the radioactive material is placed over your abdomen to monitor the rate at which food leaves your stomach. This test normally takes about 2 hours to complete.  You will have the ultrasound first then the Emptying Scan.  _____________________________________________________________________  We have sent the following medications to your pharmacy for you to pick up at your convenience: [place]. Please call us if this medication is too expensive.   Please pick up Gaviscon and take 2 tablespoons at bedtime.   Please call to make a follow up with Cecille Rubin Hvozdovic, PA  in one month.

## 2015-08-30 NOTE — Progress Notes (Signed)
Patient ID: Morgan [name], female   DOB: Nov 04, 1941, 74 y.o.   MRN: [id number]     History of Present Illness: Morgan [name]  Visit pleasant 74 year old female is known to Dr. Deatra Ina. She last had a colonoscopy on 10/13/2014 at which time a sessile polyp was found in the distal transverse colon and removed with cold forceps. A sessile polyp was found at the cecum and removed with cold snare. There was mild diverticulosis noted in the descending colon. Due to her significant family history of colon cancer, she was advised to have a repeat colonoscopy in 5 years.    She is here today with complaints of GERD and abdominal pain. She reports that she has had GERD for about 30 years. She had been on twice daily proton next but about a year ago her Protonix was increased to 2 tabs twice daily. Despite using this, for the past 3-4 months she has getting breakthrough heartburn and frequent regurgitation. She states often times when she eats , she develops epigastric bloating , belching , and has terrible heartburn. She feels full sooner than normal and has waves of nausea after she eats. She also reports that she had days of formed stools alternating with days of loose stools. More recently she has been getting bouts of explosive diarrhea followed by terrible heartburn. She reports that she was started on metformin and developed severe diarrhea and so her metformin dose was diminished. She was also started on Victoza several months ago. She denies dysphagia.  Past Medical History  Diagnosis Date  . Hypertension   . High cholesterol   . Hyperlipidemia   . Angina   . Migraines     "til ~ 1980"  . Headache(784.0)     "recurring"  . Restless leg syndrome   . Weakness of right side of body     "I've had PT for it; they don't know what it's from".  CORTISONE INJECTION INTO BACK 08/30/12  . Diabetes mellitus     diet controlled/on meds  . Fibromyalgia   . Sciatic nerve pain     "from pinched nerve"  .  Depression   . Atrial fibrillation (HCC)     ASPIRIN FOR BLOOD THINNER  . Coronary artery disease   . GERD (gastroesophageal reflux disease)   . H/O hiatal hernia   . Complication of anesthesia     pt states has choking sensation with ET tube   . PONV (postoperative nausea and vomiting)   . Arthritis   . Bulging discs     lumbar   . Sleep apnea     uses CPAP    Past Surgical History  Procedure Laterality Date  . Coronary artery bypass graft  2005    CABG X 2  . Coronary angioplasty with stent placement  06/2011    "1"  . Nasal septum surgery  ~ 1986  . 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 Dilation and curettage of uterus      "more than once"  . Knee arthroscopy  09/04/2012    Procedure: ARTHROSCOPY KNEE;  Surgeon: Gearlean Alf, MD;  Location: [facility];  Service: Orthopedics;  Laterality: Right;  right knee arthroscopy with medial and lateral meniscus debridement  . Fracture surgery  ~ 2005    nose  . 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 Elbow arthroscopy Left 07/02/2015    Procedure: ARTHROSCOPY LEFT ELBOW WITH DEBRIDEMENT AND REMOVAL LOOSE BODY;  Surgeon: Frederik Pear, MD;  Location: [facility];  Service: Orthopedics;  Laterality: Left;  . Carpal tunnel release Left 07/02/2015    Procedure: CARPAL TUNNEL RELEASE;  Surgeon: Frederik Pear, MD;  Location: [facility];  Service: Orthopedics;  Laterality: Left;   Family History  Problem Relation Age of Onset  . Cancer Mother 83    breast cancer  . Breast cancer Mother   . Heart disease Father 107    sudden onset due to CAD  . Cancer Sister 47    colon  . Hypertension Sister   . Dementia Sister   . Diabetes Sister   . Colon cancer Sister   . GER disease Daughter   . Hypertension Daughter   . Heart  disease Brother   . Hypertension Brother   . Heart attack Neg Hx   . Stroke Neg Hx    Social History  Substance Use Topics  . Smoking status: Never Smoker   . Smokeless tobacco: Never Used  . Alcohol Use: 0.6 oz/week    1 Glasses of wine per week     Comment: "occasionally drink wine"   Current Outpatient Prescriptions  Medication Sig Dispense Refill  . amoxicillin (AMOXIL) 500 MG capsule Take 500 mg by mouth as needed.    [name] aspirin 325 MG tablet Take 325 mg by mouth daily.    [name] atorvastatin (LIPITOR) 80 MG tablet TAKE 1 TABLET EVERY DAY 90 tablet 1  . cetirizine (ZYRTEC) 10 MG tablet Take 10 mg by mouth daily.    . clonazePAM (KLONOPIN) 1 MG tablet TAKE 2 TABLETS BY MOUTH AT BEDTIME 60 tablet 0  . EPINEPHrine (EPI-PEN) 0.3 mg/0.3 mL SOAJ injection Inject into the muscle once as needed.    . gabapentin (NEURONTIN) 300 MG capsule TAKE 1 TO 2 CAPSULES BY MOUTH AT BEDTIME 60 capsule 1  . glucose blood (ONE TOUCH ULTRA TEST) test strip Use to check blood sugar twice a day. DX: E11.9 100 each 5  . hydrochlorothiazide (HYDRODIURIL) 25 MG tablet TAKE 1 TABLET BY MOUTH DAILY 30 tablet 5  . Insulin Pen Needle 32G X 6 MM MISC Use to inject Victoza one time daily.  Dx: E11.9 90 each 3  . Liraglutide (VICTOZA) 18 MG/3ML SOPN Inject into the skin daily.     . metformin (FORTAMET) 1000 MG (OSM) 24 hr tablet TAKE 1 TABLET BY MOUTH EVERY DAY AS DIRECTED . IN COMBO WITH 500G ER TABLETS 90 tablet 1  . metoprolol succinate (TOPROL-XL) 100 MG 24 hr tablet TAKE 1 TABLET BY MOUTH ONCE DAILY 30 tablet 5  . Multiple Vitamins-Minerals (PRESERVISION AREDS PO) Take 1 tablet by mouth 2 (two) times daily.     [name] NITROSTAT 0.4 MG SL tablet TAKE 1 TABLET UNDER THE TONGUE AS NEEDED 50 tablet 0  . ONETOUCH DELICA LANCETS 54O MISC USED TO TEST BLOOD SUGAR ONCE DAILY DX: 250.00 100 each 3  . pantoprazole (PROTONIX) 20 MG tablet TAKE 2 TABLETS BY MOUTH TWICE A DAY 360 tablet 1  . Probiotic Product (PROBIOTIC DAILY PO)  Take 1 tablet by mouth 3 (three) times daily before meals.    . venlafaxine XR (EFFEXOR-XR) 37.5 MG 24 hr capsule Take 1 capsule (37.5 mg total) by mouth 2 (two) times daily. 180 capsule 1  . rifaximin Doreene Nest)  550 MG TABS tablet Take 1 tablet (550 mg total) by mouth 3 (three) times daily. 42 tablet 0   No current facility-administered medications for this visit.   Allergies  Allergen Reactions  . Hydrocodone-Acetaminophen Other (See Comments)    "Changed personality" "made me very mean"  . Percocet [Oxycodone-Acetaminophen] Other (See Comments)    hallucination  . Sulfa Antibiotics Other (See Comments)    hallucinations     Review of Systems:  per history of present illness otherwise negative.  LAB RESULTS:  comprehensive metabolic panel 54/56/2563 total bili 0.5, alkaline phosphatase 77, AST 19, ALT 24.  hemoglobin A1c on 08/03/2015 was 6.9.  Physical Exam: BP 126/82 mmHg  Pulse 70  Ht 5' 2.5" (1.588 m)  Wt 179 lb (81.194 kg)  BMI 32.20 kg/m2 General: Pleasant, well developed , Caucasianfemale in no acute distress Head: Normocephalic and atraumatic Eyes:  sclerae anicteric, conjunctiva pink  Ears: Normal auditory acuity Lungs: Clear throughout to auscultation Heart: Regular rate and rhythm Abdomen: Soft, non distended, non-tender. No masses, no hepatomegaly. Normal bowel sounds Musculoskeletal: Symmetrical with no gross deformities  Extremities: No edema  Neurological: Alert oriented x 4, grossly nonfocal Psychological:  Alert and cooperative. Normal mood and affect  Assessment and Recommendations:  74 year old female with a history of diabetes and a long-standing history of GERD referred for early satiety , breakthrough heartburn , and bloating. She has also been experiencing bouts of diarrhea since started on metformin, and relates that her nausea and diarrhea may be worse since she started thick toes as well. An abdominal ultrasound will be obtained to evaluate for  possible choledocholithiasis or ductal dilatation , and she will also be scheduled for a 4 hour gastric emptying scan to evaluate for possible gastroparesis as a source of her dyspepsia. She will continue her current dose of pantoprazole , and will add gaviscon2 tablespoons at bedtime. She will return in 3-4 weeks to review results. If her gastric emptying scan reveals gastroparesis , she may be  Considered for a trial of low-dose metoclopramide. If she is noted to have cholelithiasis she may be a candidate for surgical evaluation. If the above tests are nonrevealing, she may be a candidate for EGD to evaluate for esophagitis, gastritis, or ulcer. She may come to a 24-hour pH test off PPI to determine if she is having acid or nonacid reflux as well. In the meantime, for her gas , bloating , and  Increasing frequency of loose stools , she will be given a trial of Xifaxan 550 mg 3 times a day for 14 days as empiric treatment for possible small intestinal bacterial overgrowth. If her insurance does not cover Xifaxan, we will substitute Flagyl.        Morgan [name], Vita Barley PA-C 08/30/2015,

## 2015-08-31 ENCOUNTER — Encounter: Payer: Self-pay | Admitting: Cardiology

## 2015-08-31 ENCOUNTER — Ambulatory Visit (INDEPENDENT_AMBULATORY_CARE_PROVIDER_SITE_OTHER): Payer: Medicare Other | Admitting: Cardiology

## 2015-08-31 VITALS — BP 122/64 | HR 68 | Ht 62.5 in | Wt 177.8 lb

## 2015-08-31 DIAGNOSIS — E669 Obesity, unspecified: Secondary | ICD-10-CM

## 2015-08-31 DIAGNOSIS — I10 Essential (primary) hypertension: Secondary | ICD-10-CM

## 2015-08-31 DIAGNOSIS — G4733 Obstructive sleep apnea (adult) (pediatric): Secondary | ICD-10-CM | POA: Diagnosis not present

## 2015-08-31 NOTE — Progress Notes (Signed)
Agree with assessment and plan. If she has never had an EGD and has risk factors for BE we can consider screening EGD for this and to further evaluate refractory heartburn.

## 2015-08-31 NOTE — Progress Notes (Signed)
Cardiology Office Note   Date:  08/31/2015   ID:  Morgan [name], DOB 03-Oct-1941, MRN [id number]  PCP:  Eliezer Lofts, MD    Chief Complaint  Patient presents with  . Sleep Apnea  . Hypertension      History of Present Illness: Morgan [name] is a 74 y.o. female with a history of OSA, HTN and obesity who presents today for followup. She uses a full face mask which she tolerates.Her mouth dryness has improved and she feels the pressure is adequate. She occasionally has some daytime sleepiness at the end of the day but feels refreshed when she gets up in the am. Her CPAP is over 80 years old and would like to get a new machine.     Past Medical History  Diagnosis Date  . Hypertension   . High cholesterol   . Hyperlipidemia   . Angina   . Migraines     "til ~ 1980"  . Headache(784.0)     "recurring"  . Restless leg syndrome   . Weakness of right side of body     "I've had PT for it; they don't know what it's from".  CORTISONE INJECTION INTO BACK 08/30/12  . Diabetes mellitus     diet controlled/on meds  . Fibromyalgia   . Sciatic nerve pain     "from pinched nerve"  . Depression   . Atrial fibrillation (HCC)     ASPIRIN FOR BLOOD THINNER  . Coronary artery disease   . GERD (gastroesophageal reflux disease)   . H/O hiatal hernia   . Complication of anesthesia     pt states has choking sensation with ET tube   . PONV (postoperative nausea and vomiting)   . Arthritis   . Bulging discs     lumbar   . Sleep apnea     uses CPAP    Past Surgical History  Procedure Laterality Date  . Coronary artery bypass graft  2005    CABG X 2  . Coronary angioplasty with stent placement  06/2011    "1"  . Nasal septum surgery  ~ 1986  . 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 Dilation and curettage of uterus      "more than once"  . Knee arthroscopy  09/04/2012    Procedure: ARTHROSCOPY KNEE;   Surgeon: Gearlean Alf, MD;  Location: [facility];  Service: Orthopedics;  Laterality: Right;  right knee arthroscopy with medial and lateral meniscus debridement  . Fracture surgery  ~ 2005    nose  . 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 Elbow arthroscopy Left 07/02/2015    Procedure: ARTHROSCOPY LEFT ELBOW WITH DEBRIDEMENT AND REMOVAL LOOSE BODY;  Surgeon: Frederik Pear, MD;  Location: [place];  Service: Orthopedics;  Laterality: Left;  . Carpal tunnel release Left 07/02/2015    Procedure: CARPAL TUNNEL RELEASE;  Surgeon: Frederik Pear, MD;  Location: [place];  Service: Orthopedics;  Laterality: Left;     Current Outpatient Prescriptions  Medication Sig Dispense Refill  . amoxicillin (AMOXIL) 500 MG capsule Take 500 mg by mouth as needed.     [name] aspirin 325 MG tablet Take 325 mg  by mouth daily.    [name] atorvastatin (LIPITOR) 80 MG tablet TAKE 1 TABLET EVERY DAY 90 tablet 1  . cetirizine (ZYRTEC) 10 MG tablet Take 10 mg by mouth daily.    . clonazePAM (KLONOPIN) 1 MG tablet TAKE 2 TABLETS BY MOUTH AT BEDTIME 60 tablet 0  . EPINEPHrine (EPI-PEN) 0.3 mg/0.3 mL SOAJ injection Inject into the muscle once as needed.    . gabapentin (NEURONTIN) 300 MG capsule TAKE 1 TO 2 CAPSULES BY MOUTH AT BEDTIME 60 capsule 1  . glucose blood (ONE TOUCH ULTRA TEST) test strip Use to check blood sugar twice a day. DX: E11.9 100 each 5  . hydrochlorothiazide (HYDRODIURIL) 25 MG tablet TAKE 1 TABLET BY MOUTH DAILY 30 tablet 5  . Insulin Pen Needle 32G X 6 MM MISC Use to inject Victoza one time daily.  Dx: E11.9 90 each 3  . Liraglutide (VICTOZA) 18 MG/3ML SOPN Inject into the skin daily.     . metformin (FORTAMET) 1000 MG (OSM) 24 hr tablet TAKE 1 TABLET BY MOUTH EVERY DAY AS DIRECTED . IN COMBO WITH 500G ER TABLETS 90 tablet 1  . metoprolol  succinate (TOPROL-XL) 100 MG 24 hr tablet TAKE 1 TABLET BY MOUTH ONCE DAILY 30 tablet 5  . Multiple Vitamins-Minerals (PRESERVISION AREDS PO) Take 1 tablet by mouth 2 (two) times daily.     [name] NITROSTAT 0.4 MG SL tablet TAKE 1 TABLET UNDER THE TONGUE AS NEEDED 50 tablet 0  . ONETOUCH DELICA LANCETS 88B MISC USED TO TEST BLOOD SUGAR ONCE DAILY DX: 250.00 100 each 3  . pantoprazole (PROTONIX) 20 MG tablet TAKE 2 TABLETS BY MOUTH TWICE A DAY 360 tablet 1  . Probiotic Product (PROBIOTIC DAILY PO) Take 1 tablet by mouth 3 (three) times daily before meals.    . rifaximin (XIFAXAN) 550 MG TABS tablet Take 1 tablet (550 mg total) by mouth 3 (three) times daily. 42 tablet 0  . venlafaxine XR (EFFEXOR-XR) 37.5 MG 24 hr capsule Take 1 capsule (37.5 mg total) by mouth 2 (two) times daily. 180 capsule 1   No current facility-administered medications for this visit.    Allergies:   Hydrocodone-acetaminophen; Percocet; and Sulfa antibiotics    Social History:  The patient  reports that she has never smoked. She has never used smokeless tobacco. She reports that she drinks about 0.6 oz of alcohol per week. She reports that she does not use illicit drugs.   Family History:  The patient's family history includes Breast cancer in her mother; Cancer (age of onset: 45) in her mother and sister; Colon cancer in her sister; Dementia in her sister; Diabetes in her sister; GER disease in her daughter; Heart disease in her brother; Heart disease (age of onset: 28) in her father; Hypertension in her brother, daughter, and sister. There is no history of Heart attack or Stroke.    ROS:  Please see the history of present illness.   Otherwise, review of systems are positive for none.   All other systems are reviewed and negative.    PHYSICAL EXAM: VS:  BP 122/64 mmHg  Pulse 68  Ht 5' 2.5" (1.588 m)  Wt 177 lb 12.8 oz (80.65 kg)  BMI 31.98 kg/m2  SpO2 97% , BMI Body mass index is 31.98 kg/(m^2). GEN: Well nourished,  well developed, in no acute distress HEENT: normal Neck: no JVD, carotid bruits, or masses Cardiac: RRR; no murmurs, rubs, or gallops,no edema  Respiratory:  clear to auscultation bilaterally,  normal work of breathing GI: soft, nontender, nondistended, + BS MS: no deformity or atrophy Skin: warm and dry, no rash Neuro:  Strength and sensation are intact Psych: euthymic mood, full affect   EKG:  EKG is not ordered today.    Recent Labs: 07/02/2015: Hemoglobin 13.7 08/03/2015: ALT 24; BUN 25*; Creatinine, Ser 1.00; Potassium 4.5; Sodium 141    Lipid Panel    Component Value Date/Time   CHOL 119 08/03/2015 1043   TRIG 68.0 08/03/2015 1043   HDL 49.60 08/03/2015 1043   CHOLHDL 2 08/03/2015 1043   VLDL 13.6 08/03/2015 1043   LDLCALC 56 08/03/2015 1043      Wt Readings from Last 3 Encounters:  08/31/15 177 lb 12.8 oz (80.65 kg)  08/30/15 179 lb (81.194 kg)  08/10/15 176 lb 4 oz (79.946 kg)    ASSESSMENT AND PLAN:  1. OSA on CPAP - I will get a download from her DME  - I will get her set up for a new CPAP device.  She will need a split night sleep study since her last study was over 8 years ago.   2. HTN - well controlled - continue HCTZ/metoprolol  3. Obesity - she does not get much aerobic exercise.  I have encouraged her to be more active with exercise.  Current medicines are reviewed at length with the patient today.  The patient does not have concerns regarding medicines.  The following changes have been made:  no change  Labs/ tests ordered today: See above Assessment and Plan  Orders Placed This Encounter  Procedures  . Split night study     Disposition:   FU with me in 1 year  Signed, Sueanne Margarita, MD  08/31/2015 2:57 PM    [facility] [place], [place],   [address] Phone: [phone]; Fax: [phone]

## 2015-08-31 NOTE — Patient Instructions (Signed)
Medication Instructions:  Your physician recommends that you continue on your current medications as directed. Please refer to the Current Medication list given to you today.   Labwork: None  Testing/Procedures: Your physician has recommended that you have a sleep study. This test records several body functions during sleep, including: brain activity, eye movement, oxygen and carbon dioxide blood levels, heart rate and rhythm, breathing rate and rhythm, the flow of air through your mouth and nose, snoring, body muscle movements, and chest and belly movement.  Follow-Up: Your physician wants you to follow-up in: 1 year with Dr. Radford Pax. You will receive a reminder letter in the mail two months in advance. If you don't receive a letter, please call our office to schedule the follow-up appointment.   Any Other Special Instructions Will Be Listed Below (If Applicable).

## 2015-09-02 ENCOUNTER — Telehealth: Payer: Self-pay | Admitting: Physician Assistant

## 2015-09-02 NOTE — Telephone Encounter (Signed)
Caller name: Marquita Relation to pt: [facility] Fax number: [phone]   Reason for call:  Milagros Loll is needing office notes sent over for the patients [facility] request.  Fax number listed above; send to Attn: [facility]

## 2015-09-02 NOTE — Telephone Encounter (Signed)
Records faxed to [facility] at [facility].

## 2015-09-07 ENCOUNTER — Ambulatory Visit
Admission: RE | Admit: 2015-09-07 | Discharge: 2015-09-07 | Disposition: A | Discharge status: Home / Self Care | Payer: Medicare Other | Source: Ambulatory Visit | Attending: Physician Assistant | Admitting: Physician Assistant

## 2015-09-07 ENCOUNTER — Encounter
Admission: RE | Admit: 2015-09-07 | Discharge: 2015-09-07 | Disposition: A | Discharge status: Home / Self Care | Payer: Medicare Other | Source: Ambulatory Visit | Attending: Physician Assistant | Admitting: Physician Assistant

## 2015-09-07 ENCOUNTER — Other Ambulatory Visit: Payer: Self-pay | Admitting: Family Medicine

## 2015-09-07 DIAGNOSIS — R110 Nausea: Secondary | ICD-10-CM | POA: Insufficient documentation

## 2015-09-07 DIAGNOSIS — K760 Fatty (change of) liver, not elsewhere classified: Secondary | ICD-10-CM | POA: Diagnosis not present

## 2015-09-07 DIAGNOSIS — R1013 Epigastric pain: Secondary | ICD-10-CM

## 2015-09-07 DIAGNOSIS — K219 Gastro-esophageal reflux disease without esophagitis: Secondary | ICD-10-CM | POA: Diagnosis present

## 2015-09-07 MED ORDER — TECHNETIUM TC 99M SULFUR COLLOID
2.0000 | Freq: Once | INTRAVENOUS | Status: DC | PRN
  Administered 2015-09-07: 2.2 via INTRAVENOUS
  Filled 2015-09-07: qty 2

## 2015-09-07 NOTE — Telephone Encounter (Signed)
Last office visit 08/10/2015.  Last refilled 05/24/2015 for #60 with 1 refill. Ok to refill?

## 2015-09-09 ENCOUNTER — Telehealth: Payer: Self-pay | Admitting: Physician Assistant

## 2015-09-10 LAB — HM DIABETES EYE EXAM

## 2015-09-12 ENCOUNTER — Other Ambulatory Visit: Payer: Self-pay | Admitting: Family Medicine

## 2015-09-12 NOTE — Telephone Encounter (Signed)
Last office visit 08/10/2015.  Last refilled

## 2015-09-13 ENCOUNTER — Other Ambulatory Visit: Payer: Self-pay | Admitting: Emergency Medicine

## 2015-09-13 MED ORDER — METRONIDAZOLE 500 MG PO TABS: 500.0000 mg | ORAL_TABLET | Freq: Two times a day (BID) | ORAL | Status: DC

## 2015-09-13 NOTE — Telephone Encounter (Signed)
Patient informed of medication changes. Advised to avoid alcohol. RX called into pharmacy.

## 2015-09-13 NOTE — Telephone Encounter (Signed)
Can she use flagyl 500 mg bid x 10 days? Tell her to avoid alcohol while on flagyl and for 3 days later. Thank you.

## 2015-09-14 ENCOUNTER — Other Ambulatory Visit: Payer: Self-pay | Admitting: Family Medicine

## 2015-09-18 ENCOUNTER — Other Ambulatory Visit: Payer: Self-pay | Admitting: Family Medicine

## 2015-09-20 ENCOUNTER — Encounter: Payer: Self-pay | Admitting: Cardiology

## 2015-09-20 ENCOUNTER — Ambulatory Visit (INDEPENDENT_AMBULATORY_CARE_PROVIDER_SITE_OTHER): Payer: Medicare Other | Admitting: Cardiology

## 2015-09-20 VITALS — BP 132/78 | HR 80 | Ht 62.5 in | Wt 180.4 lb

## 2015-09-20 DIAGNOSIS — I10 Essential (primary) hypertension: Secondary | ICD-10-CM | POA: Diagnosis not present

## 2015-09-20 DIAGNOSIS — E119 Type 2 diabetes mellitus without complications: Secondary | ICD-10-CM | POA: Diagnosis not present

## 2015-09-20 DIAGNOSIS — I2583 Coronary atherosclerosis due to lipid rich plaque: Principal | ICD-10-CM

## 2015-09-20 DIAGNOSIS — I2510 Atherosclerotic heart disease of native coronary artery without angina pectoris: Secondary | ICD-10-CM | POA: Diagnosis not present

## 2015-09-20 DIAGNOSIS — E7800 Pure hypercholesterolemia, unspecified: Secondary | ICD-10-CM

## 2015-09-20 DIAGNOSIS — G4733 Obstructive sleep apnea (adult) (pediatric): Secondary | ICD-10-CM

## 2015-09-20 MED ORDER — ASPIRIN 81 MG PO TABS
81.0000 mg | ORAL_TABLET | Freq: Every day | ORAL | Status: DC
Start: 2015-09-20 — End: 2016-10-12

## 2015-09-20 NOTE — Progress Notes (Signed)
[place]. [address], [address] [place], [place]  [address] Phone: [phone] Fax:  [phone]  Date:  09/20/2015   ID:  Morgan [name], DOB 13-Sep-1941, MRN [id number]  PCP:  Eliezer Lofts, MD   History of Present Illness: Morgan [name] is a 74 y.o. female with coronary artery disease status post bypass surgery in 2006, diagonal stent, DM here for followup.   Had episode of angioedema (testing for allergies has been done - negative except for cockroach). in April 2014, I stopped her losartan because of the small chance of cross reactivity/angioedema. Her blood pressure today was excellent. Cough has improved. ENT [facility]).   In review of her coronary disease, in April of 2012 she had cardiac catheterization with drug-eluting stent the first diagonal branch that had previously been determined to small for bypass in 2006. She completed cardiac rehabilitation. Approximately a year later she was having chest discomfort moderate in intensity but mostly leg pain/rheumatologic complaints. She underwent nuclear stress test on 02/05/12 which was low risk showing only possible subtle ischemia in the distal anterolateral segment. Normal ejection fraction.   Hyperlipidemia-last LDL 58 on 8/14. Excellent. Blood pressure also under good control.   Glucose mildly elevated. Hemoglobin A1C was 8.4.  Knee replacement.   Feeling mild SOB, HA. Reminds her slightly of the way she felt prior to her diagonal, small caliber PCI.  Post knee.   09/18/14 - Had cerebellar TIA. Nausea, flashes in front of eye. Working on diabetes. Metformin, diarrhea. Hemoglobin A1c 8.2. Excellent LDL 59. Mild bilateral carotid plaque.  03/17/15 - fell, sprained elbow. Ankle sprain with second fall. Has some mild shortness of breath upon long walking excursions. No chest pain. No sig. She has had 2 falls. Sprained ankle, elbow.   Wt Readings from Last 3 Encounters:  09/20/15 180 lb 6.4 oz (81.829 kg)  08/31/15 177 lb 12.8 oz  (80.65 kg)  08/30/15 179 lb (81.194 kg)     Past Medical History  Diagnosis Date  . Hypertension   . High cholesterol   . Hyperlipidemia   . Angina   . Migraines     "til ~ 1980"  . Headache(784.0)     "recurring"  . Restless leg syndrome   . Weakness of right side of body     "I've had PT for it; they don't know what it's from".  CORTISONE INJECTION INTO BACK 08/30/12  . Diabetes mellitus     diet controlled/on meds  . Fibromyalgia   . Sciatic nerve pain     "from pinched nerve"  . Depression   . Atrial fibrillation (HCC)     ASPIRIN FOR BLOOD THINNER  . Coronary artery disease   . GERD (gastroesophageal reflux disease)   . H/O hiatal hernia   . Complication of anesthesia     pt states has choking sensation with ET tube   . PONV (postoperative nausea and vomiting)   . Arthritis   . Bulging discs     lumbar   . Sleep apnea     uses CPAP    Past Surgical History  Procedure Laterality Date  . Coronary artery bypass graft  2005    CABG X 2  . Coronary angioplasty with stent placement  06/2011    "1"  . Nasal septum surgery  ~ 1986  . 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 Dilation and curettage of uterus      "  more than once"  . Knee arthroscopy  09/04/2012    Procedure: ARTHROSCOPY KNEE;  Surgeon: Gearlean Alf, MD;  Location: [facility];  Service: Orthopedics;  Laterality: Right;  right knee arthroscopy with medial and lateral meniscus debridement  . Fracture surgery  ~ 2005    nose  . 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 Elbow arthroscopy Left 07/02/2015    Procedure: ARTHROSCOPY LEFT ELBOW WITH DEBRIDEMENT AND REMOVAL LOOSE BODY;  Surgeon: Frederik Pear, MD;  Location: [place];  Service: Orthopedics;  Laterality: Left;  . Carpal tunnel release Left  07/02/2015    Procedure: CARPAL TUNNEL RELEASE;  Surgeon: Frederik Pear, MD;  Location: [place];  Service: Orthopedics;  Laterality: Left;    Current Outpatient Prescriptions  Medication Sig Dispense Refill  . amoxicillin (AMOXIL) 500 MG capsule Take 500 mg by mouth as needed.     [name] aspirin 325 MG tablet Take 325 mg by mouth daily.    [name] atorvastatin (LIPITOR) 80 MG tablet TAKE 1 TABLET EVERY DAY 90 tablet 1  . cetirizine (ZYRTEC) 10 MG tablet Take 10 mg by mouth daily.    . clonazePAM (KLONOPIN) 1 MG tablet TAKE 2 TABLETS BY MOUTH AT BEDTIME 60 tablet 0  . gabapentin (NEURONTIN) 300 MG capsule TAKE 1 TO 2 CAPSULES BY MOUTH AT BEDTIME 60 capsule 0  . glucose blood (ONE TOUCH ULTRA TEST) test strip Use to check blood sugar twice a day. DX: E11.9 100 each 5  . hydrochlorothiazide (HYDRODIURIL) 25 MG tablet TAKE 1 TABLET BY MOUTH DAILY 30 tablet 5  . Insulin Pen Needle 32G X 6 MM MISC Use to inject Victoza one time daily.  Dx: E11.9 90 each 3  . Liraglutide (VICTOZA) 18 MG/3ML SOPN Inject into the skin daily.     . metformin (FORTAMET) 1000 MG (OSM) 24 hr tablet TAKE 1 TABLET BY MOUTH EVERY DAY AS DIRECTED . IN COMBO WITH 500G ER TABLETS 90 tablet 1  . metoprolol succinate (TOPROL-XL) 100 MG 24 hr tablet TAKE 1 TABLET BY MOUTH ONCE DAILY 30 tablet 5  . metroNIDAZOLE (FLAGYL) 500 MG tablet Take 1 tablet (500 mg total) by mouth 2 (two) times daily. 20 tablet 0  . Multiple Vitamins-Minerals (PRESERVISION AREDS PO) Take 1 tablet by mouth 2 (two) times daily.     [name] NITROSTAT 0.4 MG SL tablet TAKE 1 TABLET UNDER THE TONGUE AS NEEDED 50 tablet 0  . ONETOUCH DELICA LANCETS 81E MISC USED TO TEST BLOOD SUGAR ONCE DAILY DX: 250.00 100 each 3  . pantoprazole (PROTONIX) 20 MG tablet TAKE 2 TABLETS BY MOUTH TWICE A DAY 360 tablet 1  . Probiotic Product (PROBIOTIC DAILY PO) Take 1 tablet by mouth 3 (three) times daily before meals.    . rifaximin (XIFAXAN) 550 MG TABS tablet Take 1 tablet (550 mg  total) by mouth 3 (three) times daily. 42 tablet 0  . venlafaxine XR (EFFEXOR-XR) 37.5 MG 24 hr capsule Take 1 capsule (37.5 mg total) by mouth 2 (two) times daily. 180 capsule 1  . EPINEPHrine (EPI-PEN) 0.3 mg/0.3 mL SOAJ injection Inject into the muscle once as needed.    . meloxicam (MOBIC) 15 MG tablet      No current facility-administered medications for this visit.    Allergies:    Allergies  Allergen Reactions  .  Hydrocodone-Acetaminophen Other (See Comments)    "Changed personality" "made me very mean"  . Percocet [Oxycodone-Acetaminophen] Other (See Comments)    hallucination  . Sulfa Antibiotics Other (See Comments)    hallucinations    Social History:  The patient  reports that she has never smoked. She has never used smokeless tobacco. She reports that she drinks about 0.6 oz of alcohol per week. She reports that she does not use illicit drugs.   ROS:  Please see the history of present illness.   Denies any fevers, chills, orthopnea, PND, syncope    PHYSICAL EXAM: VS:  BP 132/78 mmHg  Pulse 80  Ht 5' 2.5" (1.588 m)  Wt 180 lb 6.4 oz (81.829 kg)  BMI 32.45 kg/m2  SpO2 95% Well nourished, well developed, in no acute distress HEENT: normal Neck: no JVD Cardiac:  normal S1, S2; RRR; no murmurPrior bypass scar well healed Lungs:  clear to auscultation bilaterally, no wheezing, rhonchi or rales Abd: soft, nontender, no hepatomegaly Ext: no edema.  Skin: warm and dry Neuro: no focal abnormalities noted  EKG:  today-03/17/15-sinus rhythm, 69, nonspecific T-wave changes-no change from prior.03/23/14-sinus rhythm, 63, nonspecific ST flattening. Overall normal.   ASSESSMENT AND PLAN:  1. Coronary artery disease-status post bypass/ DES diagonal placement. Overall doing well. Monitor her symptoms. Continue beta blocker. Off Plavix. Aspirin 81 mg. 2. Obstructive sleep apnea-Dr.Turner 3. TIA-cerebellar. Nausea, vestibular symptoms. Eye Dr. Diagnosis. No evidence of AFIB. Has  had a few issues (floating black spots, sick to stomach, occas HA) watch for any signs of orthopnea. 4. S/p knee replacement 07/07/13 - Dr. Wynelle Link. Proved.  5. Hyperlipidemia-excellent lipids. Reviewed labs. Continue with atorvastatin. LDL 48.  6. HTN-mildly elevated. Continue to monitor.We will continue with current medications. Watch for any signs of worsening orthostasis. 7. Diabetes-metformin. Glucose reviewed. She is quite pleased. A1c 8.2 now 6.9 8. Obesity-encourage weight loss once knee pain/physical therapy is improving. Exercise. Be careful with falls. 9. We will see back in 6 months.  Signed, Candee Furbish, MD [facility]  09/20/2015 3:11 PM

## 2015-09-20 NOTE — Patient Instructions (Signed)

## 2015-09-22 ENCOUNTER — Other Ambulatory Visit: Payer: Self-pay | Admitting: Family Medicine

## 2015-09-22 NOTE — Telephone Encounter (Signed)
Last office visit 08/10/2015.  Last refilled 08/11/2015 for #60 with no refills.  Ok to refill?

## 2015-09-23 ENCOUNTER — Telehealth: Payer: Self-pay | Admitting: Physician Assistant

## 2015-09-23 DIAGNOSIS — R197 Diarrhea, unspecified: Secondary | ICD-10-CM

## 2015-09-23 LAB — FECAL OCCULT BLOOD, GUAIAC: Fecal Occult Blood: NEGATIVE

## 2015-09-23 NOTE — Telephone Encounter (Signed)
Please check stool for c diff. Can try imodium.Have her use tucks wipes. Have her use desitin diaper rash ointmat to perirectal area. Check cbc. Will need to be seen to assess bleeding.

## 2015-09-23 NOTE — Telephone Encounter (Signed)
Labs in EPIC. sclheduled OV on 09/28/15 at 9:00 AM with Cecille Rubin Hvozdovic, PA-C. Left a message for patient to call back.

## 2015-09-23 NOTE — Telephone Encounter (Signed)
Patient given appointment and recommendations.

## 2015-09-23 NOTE — Telephone Encounter (Signed)
Patient is on her last Flagyl tablet. She is having 2-3 pudding like stools daily and is leaking stool with gas. She is wearing Depends. States her rectum is sore and she is having bright, red blood from wiping so much. Please, advise.

## 2015-09-23 NOTE — Telephone Encounter (Signed)
Clonazepam called into [facility] [address] 

## 2015-09-24 ENCOUNTER — Other Ambulatory Visit (INDEPENDENT_AMBULATORY_CARE_PROVIDER_SITE_OTHER): Payer: Medicare Other

## 2015-09-24 DIAGNOSIS — R197 Diarrhea, unspecified: Secondary | ICD-10-CM

## 2015-09-24 LAB — CBC WITH DIFFERENTIAL/PLATELET
BASOS ABS: 0 10*3/uL (ref 0.0–0.1)
Basophils Relative: 0.4 % (ref 0.0–3.0)
Eosinophils Absolute: 0.3 10*3/uL (ref 0.0–0.7)
Eosinophils Relative: 4.5 % (ref 0.0–5.0)
HCT: 37.3 % (ref 36.0–46.0)
Hemoglobin: 12.1 g/dL (ref 12.0–15.0)
LYMPHS ABS: 1.5 10*3/uL (ref 0.7–4.0)
Lymphocytes Relative: 19.1 % (ref 12.0–46.0)
MCHC: 32.5 g/dL (ref 30.0–36.0)
MCV: 89.3 fl (ref 78.0–100.0)
MONO ABS: 0.9 10*3/uL (ref 0.1–1.0)
Monocytes Relative: 11.3 % (ref 3.0–12.0)
NEUTROS PCT: 64.7 % (ref 43.0–77.0)
Neutro Abs: 5 10*3/uL (ref 1.4–7.7)
PLATELETS: 308 10*3/uL (ref 150.0–400.0)
RBC: 4.17 Mil/uL (ref 3.87–5.11)
RDW: 13.8 % (ref 11.5–15.5)
WBC: 7.8 10*3/uL (ref 4.0–10.5)

## 2015-09-28 ENCOUNTER — Encounter: Payer: Self-pay | Admitting: Physician Assistant

## 2015-09-28 ENCOUNTER — Telehealth: Payer: Self-pay

## 2015-09-28 ENCOUNTER — Ambulatory Visit (INDEPENDENT_AMBULATORY_CARE_PROVIDER_SITE_OTHER): Payer: Medicare Other | Admitting: Physician Assistant

## 2015-09-28 ENCOUNTER — Other Ambulatory Visit: Payer: Medicare Other

## 2015-09-28 VITALS — BP 130/78 | HR 74 | Ht 62.5 in | Wt 176.0 lb

## 2015-09-28 DIAGNOSIS — K219 Gastro-esophageal reflux disease without esophagitis: Secondary | ICD-10-CM | POA: Diagnosis not present

## 2015-09-28 DIAGNOSIS — K648 Other hemorrhoids: Secondary | ICD-10-CM

## 2015-09-28 DIAGNOSIS — K589 Irritable bowel syndrome without diarrhea: Secondary | ICD-10-CM

## 2015-09-28 DIAGNOSIS — K644 Residual hemorrhoidal skin tags: Secondary | ICD-10-CM

## 2015-09-28 MED ORDER — HYDROCORTISONE 2.5 % RE CREA
1.0000 | TOPICAL_CREAM | Freq: Two times a day (BID) | RECTAL | Status: DC
Start: 2015-09-28 — End: 2016-03-14

## 2015-09-28 NOTE — Progress Notes (Signed)
Agree with assessment and plan as outlined.  

## 2015-09-28 NOTE — Progress Notes (Signed)
Patient ID: Morgan [name], female   DOB: [birth year], 74 y.o.   MRN: [id number]     History of Present Illness: Morgan [name] is a pleasant 74 year old female who was last evaluated here on October 17. She has a history of hyperlipidemia, high cholesterol, hypertension, angina, migraines, restless leg syndrome, diabetes, fibromyalgia, depression, atrial fibrillation, coronary artery disease, sleep apnea for which she uses CPAP, and GERD.  At her last visit she was complaining of GERD and abdominal pain she had been using Protonix twice a day but was getting breakthrough heartburn and frequent regurgitation. She complained of postprandial epigastric bloating, belching, and heartburn along with early satiety and postprandial nausea. She reported that she was having days of formed stools alternating with days of loose stools. At that time she had been having bouts of explosive diarrhea. She had been started on metformin and developed diarrhea so her metformin was diminished. She had also recently been started on victoza and feels that exacerbated her nausea.  She was sent for gastric emptying scan and abdominal ultrasound that were nonrevealing. She was instructed to continue pantoprazole twice a day and Gaviscon 2 tablespoons was added at bedtime. She reports now that she is having no nocturnal regurgitation and less heartburn but she continues to complain of early satiety and waves of nausea. She was prescribed Xifaxan but her insurance would not cover it and so she was instead given Flagyl 500 mg 3 times a day for 10 days for possible small intestinal bacterial overgrowth. While on the Flagyl she had one day of very mushy stools and had some blood on the toilet tissue she called the office and a stool for C. difficile was ordered. She was advised to use Desitin diaper rash ointment to the perirectal area and to use Tucks wipes. She is here today for follow-up. Her stools are now hard and nugget-like and  she is going every other day. She has had no further rectal bleeding but she has some rectal itching.   Past Medical History  Diagnosis Date  . Hypertension   . High cholesterol   . Hyperlipidemia   . Angina   . Migraines     "til ~ 1980"  . Headache(784.0)     "recurring"  . Restless leg syndrome   . Weakness of right side of body     "I've had PT for it; they don't know what it's from".  CORTISONE INJECTION INTO BACK 08/30/12  . Diabetes mellitus     diet controlled/on meds  . Fibromyalgia   . Sciatic nerve pain     "from pinched nerve"  . Depression   . Atrial fibrillation (HCC)     ASPIRIN FOR BLOOD THINNER  . Coronary artery disease   . GERD (gastroesophageal reflux disease)   . H/O hiatal hernia   . Complication of anesthesia     pt states has choking sensation with ET tube   . PONV (postoperative nausea and vomiting)   . Arthritis   . Bulging discs     lumbar   . Sleep apnea     uses CPAP    Past Surgical History  Procedure Laterality Date  . Coronary artery bypass graft  2005    CABG X 2  . Coronary angioplasty with stent placement  06/2011    "1"  . Nasal septum surgery  ~ 1986  . 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 Dilation  and curettage of uterus      "more than once"  . Knee arthroscopy  09/04/2012    Procedure: ARTHROSCOPY KNEE;  Surgeon: Gearlean Alf, MD;  Location: [facility];  Service: Orthopedics;  Laterality: Right;  right knee arthroscopy with medial and lateral meniscus debridement  . Fracture surgery  ~ 2005    nose  . 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 Elbow arthroscopy Left 07/02/2015    Procedure: ARTHROSCOPY LEFT ELBOW WITH DEBRIDEMENT AND REMOVAL LOOSE BODY;  Surgeon: Frederik Pear, MD;  Location: [place];  Service:  Orthopedics;  Laterality: Left;  . Carpal tunnel release Left 07/02/2015    Procedure: CARPAL TUNNEL RELEASE;  Surgeon: Frederik Pear, MD;  Location: [place];  Service: Orthopedics;  Laterality: Left;   Family History  Problem Relation Age of Onset  . Cancer Mother 46    breast cancer  . Breast cancer Mother   . Heart disease Father 84    sudden onset due to CAD  . Cancer Sister 8    colon  . Hypertension Sister   . Dementia Sister   . Diabetes Sister   . Colon cancer Sister   . GER disease Daughter   . Hypertension Daughter   . Heart disease Brother   . Hypertension Brother   . Heart attack Neg Hx   . Stroke Neg Hx    Social History  Substance Use Topics  . Smoking status: Never Smoker   . Smokeless tobacco: Never Used  . Alcohol Use: 0.6 oz/week    1 Glasses of wine per week     Comment: "occasionally drink wine"   Current Outpatient Prescriptions  Medication Sig Dispense Refill  . amoxicillin (AMOXIL) 500 MG capsule Take 500 mg by mouth as needed.     [name] aspirin 81 MG tablet Take 1 tablet (81 mg total) by mouth daily.    [name] atorvastatin (LIPITOR) 80 MG tablet TAKE 1 TABLET EVERY DAY 90 tablet 1  . cetirizine (ZYRTEC) 10 MG tablet Take 10 mg by mouth daily.    . clonazePAM (KLONOPIN) 1 MG tablet TAKE 2 TABLETS BY MOUTH AT BEDTIME 60 tablet 0  . EPINEPHrine (EPI-PEN) 0.3 mg/0.3 mL SOAJ injection Inject into the muscle once as needed.    . gabapentin (NEURONTIN) 300 MG capsule TAKE 1 TO 2 CAPSULES BY MOUTH AT BEDTIME 60 capsule 0  . glucose blood (ONE TOUCH ULTRA TEST) test strip Use to check blood sugar twice a day. DX: E11.9 100 each 5  . hydrochlorothiazide (HYDRODIURIL) 25 MG tablet TAKE 1 TABLET BY MOUTH DAILY 30 tablet 5  . Insulin Pen Needle 32G X 6 MM MISC Use to inject Victoza one time daily.  Dx: E11.9 90 each 3  . Liraglutide (VICTOZA) 18 MG/3ML SOPN Inject into the skin daily.     . meloxicam (MOBIC) 15 MG tablet     . metformin (FORTAMET) 1000  MG (OSM) 24 hr tablet TAKE 1 TABLET BY MOUTH EVERY DAY AS DIRECTED . IN COMBO WITH 500G ER TABLETS 90 tablet 1  . metoprolol succinate (TOPROL-XL) 100 MG 24 hr tablet TAKE 1 TABLET BY MOUTH ONCE DAILY 30 tablet 5  . Multiple Vitamins-Minerals (PRESERVISION AREDS PO) Take 1 tablet by mouth 2 (two) times daily.     [name] NITROSTAT 0.4 MG SL tablet  TAKE 1 TABLET UNDER THE TONGUE AS NEEDED 50 tablet 0  . ONETOUCH DELICA LANCETS 99991111 MISC USED TO TEST BLOOD SUGAR ONCE DAILY DX: 250.00 100 each 3  . pantoprazole (PROTONIX) 20 MG tablet TAKE 2 TABLETS BY MOUTH TWICE A DAY 360 tablet 1  . Probiotic Product (PROBIOTIC DAILY PO) Take 1 tablet by mouth 3 (three) times daily before meals.    . venlafaxine XR (EFFEXOR-XR) 37.5 MG 24 hr capsule Take 1 capsule (37.5 mg total) by mouth 2 (two) times daily. 180 capsule 1  . hydrocortisone (ANUSOL-HC) 2.5 % rectal cream Place 1 application rectally 2 (two) times daily. Use 2 times a day for 10 days 30 g 1   No current facility-administered medications for this visit.   Allergies  Allergen Reactions  . Hydrocodone-Acetaminophen Other (See Comments)    "Changed personality" "made me very mean"  . Percocet [Oxycodone-Acetaminophen] Other (See Comments)    hallucination  . Sulfa Antibiotics Other (See Comments)    hallucinations     Review of Systems: Gen: Denies any fever, chills, sweats, anorexia, fatigue, weakness, malaise, weight loss, and sleep disorder CV: Denies chest pain, angina, palpitations, syncope, orthopnea, PND, peripheral edema, and claudication. Resp: Denies dyspnea at rest, dyspnea with exercise, cough, sputum, wheezing, coughing up blood, and pleurisy. GI: Denies vomiting blood, jaundice, and fecal incontinence.   Denies dysphagia or odynophagia. GU : Denies urinary burning, blood in urine, urinary frequency, urinary hesitancy, nocturnal urination, and urinary incontinence. MS: Denies joint pain, limitation of movement, and swelling, stiffness,  low back pain, extremity pain. Denies muscle weakness, cramps, atrophy.  Derm: Denies rash, itching, dry skin, hives, moles, warts, or unhealing ulcers.  Psych: Denies depression, anxiety, memory loss, suicidal ideation, hallucinations, paranoia, and confusion. Heme: Denies bruising, bleeding, and enlarged lymph nodes. Neuro:  Denies any headaches, dizziness, paresthesia Endo:  Denies any problems with DM, thyroid, adrenal  LAB RESULTS: CBC with differential on 09/24/2015 WBC 7.8, hemoglobin 12.1, hematocrit 37.3, platelets 308,000. Studies:   Nm Gastric Emptying  09/07/2015  CLINICAL DATA:  Abdominal pain.  Diabetes mellitus. EXAM: NUCLEAR MEDICINE GASTRIC EMPTYING SCAN TECHNIQUE: After oral ingestion of radiolabeled meal, sequential abdominal images were obtained for 4 hours. Percentage of activity emptying the stomach was calculated at 1 hour, 2 hour, 3 hour, and 4 hours. RADIOPHARMACEUTICALS:  2.20 mCi Tc-68m MDP labeled sulfur colloid in egg orally COMPARISON:  None. FINDINGS: Expected location of the stomach in the left upper quadrant. Ingested meal empties the stomach gradually over the course of the study. 43% emptied at 1 hr ( normal >= 10%) 75% emptied at 2 hr ( normal >= 40%) 86% emptied at 3 hr ( normal >= 70%) 98% emptied at 4 hr ( normal >= 90%) IMPRESSION: Normal  gastric emptying study. Electronically Signed   By: Lowella Grip III M.D.   On: 09/07/2015 14:20   US Abdomen Complete  09/07/2015  CLINICAL DATA:  Epigastric abdominal pain, history of reflux, nausea. EXAM: ULTRASOUND ABDOMEN COMPLETE COMPARISON:  None in PACs FINDINGS: Gallbladder: No gallstones or wall thickening visualized. No sonographic Murphy sign noted. Common bile duct: Diameter: 5 mm Liver: The hepatic echotexture is mildly increased. There is no focal mass nor ductal dilation. The surface contour of the liver is normal where visualized. IVC: No abnormality visualized. Pancreas: The pancreatic body is  unremarkable. The pancreatic head and tail are obscured by bowel gas. Spleen: Size and appearance within normal limits. Right Kidney: Length: 11.1 cm. The renal cortical echotexture  is normal. There is no focal mass, hydronephrosis, or echogenic stone. Left Kidney: Length: 10.6 cm. The renal cortical echotexture is normal. In the midpole there is a 7 mm echogenic focus with distal shadowing which may reflect a nonobstructing stone. Abdominal aorta: No aneurysm visualized.  There is mural plaque. Other findings: There is no ascites. IMPRESSION: 1. Fatty infiltrative change of the liver. No acute gallbladder abnormality. Limited visualization of the pancreas. 2. Possible nonobstructing 7 mm left-sided kidney stone. Electronically Signed   By: David  Martinique M.D.   On: 09/07/2015 09:51     Physical Exam: BP 130/78 mmHg  Pulse 74  Ht 5' 2.5" (1.588 m)  Wt 176 lb (79.833 kg)  BMI 31.66 kg/m2 General: Pleasant, well developed , female in no acute distress Head: Normocephalic and atraumatic Eyes:  sclerae anicteric, conjunctiva pink  Ears: Normal auditory acuity Lungs: Clear throughout to auscultation Heart: Regular rate and rhythm Abdomen: Soft, non distended, non-tender. No masses, no hepatomegaly. Normal bowel sounds Rectal: Small external hemorrhoids, brown stool heme negative. Musculoskeletal: Symmetrical with no gross deformities  Extremities: No edema  Neurological: Alert oriented x 4, grossly nonfocal Psychological:  Alert and cooperative. Normal mood and affect  Assessment and Recommendations:  #1 GERD. An antireflux regimen has been reviewed. She will continue twice a day Protonix. She will be scheduled for an EGD to evaluate for esophagitis, gastritis, ulcers, etc.The risks, benefits, and alternatives to endoscopy with possible biopsy and possible dilation were discussed with the patient and they consent to proceed.  The procedure will be scheduled with Dr. Havery Moros.  #2. IBS. She's  been instructed to adhere to a high-fiber low-fat diet.  #3. External hemorrhoids. She will be given a trial of Anusol HC cream to apply rectally twice a day for 7-10 days. She will use Tucks wipes.  Follow-up recommendations will be made pending the findings of the above.       Lizzet Hendley, Vita Barley PA-C 09/28/2015,

## 2015-09-28 NOTE — Patient Instructions (Signed)
You have been scheduled for an endoscopy. Please follow written instructions given to you at your visit today. If you use inhalers (even only as needed), please bring them with you on the day of your procedure. Your physician has requested that you go to www.startemmi.com and enter the access code given to you at your visit today. This web site gives a general overview about your procedure. However, you should still follow specific instructions given to you by our office regarding your preparation for the procedure.  We have sent the following medications to your pharmacy for you to pick up at your convenience: Anusol 2.5% cream  Continue Protonix twice a day.

## 2015-09-28 NOTE — Telephone Encounter (Signed)
Gae Bon from the lab called and stated that the stool submitted was formed and the stool study for C.Diff has been cancelled. Pt contacted via cell phone and was notified. Pt states understanding.

## 2015-10-01 ENCOUNTER — Other Ambulatory Visit: Payer: Self-pay | Admitting: Family Medicine

## 2015-10-03 ENCOUNTER — Other Ambulatory Visit: Payer: Self-pay | Admitting: Family Medicine

## 2015-10-16 ENCOUNTER — Other Ambulatory Visit: Payer: Self-pay | Admitting: Family Medicine

## 2015-10-16 NOTE — Telephone Encounter (Signed)
Last office visit 08/10/2015.  Last refilled 09/07/2015 for #60 with no refills.  Ok to refill?

## 2015-10-25 ENCOUNTER — Ambulatory Visit (INDEPENDENT_AMBULATORY_CARE_PROVIDER_SITE_OTHER): Payer: Medicare Other | Admitting: Podiatry

## 2015-10-25 ENCOUNTER — Encounter: Payer: Self-pay | Admitting: Podiatry

## 2015-10-25 ENCOUNTER — Ambulatory Visit: Payer: Medicare Other

## 2015-10-25 DIAGNOSIS — B351 Tinea unguium: Secondary | ICD-10-CM | POA: Diagnosis not present

## 2015-10-25 DIAGNOSIS — M79676 Pain in unspecified toe(s): Secondary | ICD-10-CM

## 2015-10-25 NOTE — Progress Notes (Signed)
She presents today with a chief complaint of painful elongated toenails bilaterally.  Objective: Vital signs stable alert and oriented 3. Pulses are strongly palpable. Neurologic sensorium is intact per Semmes-Weinstein monofilament. Deep tendon reflexes are intact. Toenails are thick yellow dystrophic onychomycotic and painful on palpation.  Assessment: Pain and limb secondary to onychomycosis 1 through 5 bilateral.  Plan: Debridement of toenails 1 through 5 bilateral covered service secondary to pain.

## 2015-10-26 ENCOUNTER — Other Ambulatory Visit: Payer: Self-pay | Admitting: Family Medicine

## 2015-10-26 NOTE — Telephone Encounter (Signed)
Last office visit 08/10/2015.  Last refilled 09/22/2015 for #60 with no refills.  Ok to refill?

## 2015-10-28 NOTE — Telephone Encounter (Signed)
Clonazepam called into [facility] [address] 

## 2015-11-17 ENCOUNTER — Other Ambulatory Visit: Payer: Self-pay | Admitting: Family Medicine

## 2015-11-17 NOTE — Telephone Encounter (Signed)
Last office visit 08/10/2015.  Last refilled 10/18/2015 for #60 with no refills.  Ok to refill?

## 2015-11-21 ENCOUNTER — Other Ambulatory Visit: Payer: Self-pay | Admitting: Family Medicine

## 2015-11-22 ENCOUNTER — Encounter: Payer: Medicare Other | Admitting: Gastroenterology

## 2015-11-23 ENCOUNTER — Encounter: Payer: Self-pay | Admitting: Family Medicine

## 2015-11-30 ENCOUNTER — Ambulatory Visit (HOSPITAL_BASED_OUTPATIENT_CLINIC_OR_DEPARTMENT_OTHER): Payer: Medicare Other | Attending: Cardiology

## 2015-11-30 ENCOUNTER — Encounter: Payer: Self-pay | Admitting: Primary Care

## 2015-11-30 ENCOUNTER — Ambulatory Visit: Payer: Medicare Other | Admitting: Primary Care

## 2015-11-30 ENCOUNTER — Ambulatory Visit (INDEPENDENT_AMBULATORY_CARE_PROVIDER_SITE_OTHER): Payer: Medicare Other | Admitting: Primary Care

## 2015-11-30 VITALS — BP 124/84 | HR 64 | Temp 97.7°F | Ht 62.25 in | Wt 179.8 lb

## 2015-11-30 DIAGNOSIS — R300 Dysuria: Secondary | ICD-10-CM

## 2015-11-30 DIAGNOSIS — Z79899 Other long term (current) drug therapy: Secondary | ICD-10-CM | POA: Insufficient documentation

## 2015-11-30 DIAGNOSIS — R0683 Snoring: Secondary | ICD-10-CM | POA: Diagnosis not present

## 2015-11-30 DIAGNOSIS — G4733 Obstructive sleep apnea (adult) (pediatric): Secondary | ICD-10-CM | POA: Insufficient documentation

## 2015-11-30 DIAGNOSIS — I493 Ventricular premature depolarization: Secondary | ICD-10-CM | POA: Insufficient documentation

## 2015-11-30 LAB — POC URINALSYSI DIPSTICK (AUTOMATED)
BILIRUBIN UA: NEGATIVE
GLUCOSE UA: NEGATIVE
Ketones, UA: NEGATIVE
LEUKOCYTES UA: NEGATIVE
NITRITE UA: NEGATIVE
Protein, UA: NEGATIVE
RBC UA: NEGATIVE
Spec Grav, UA: 1.02
Urobilinogen, UA: NEGATIVE
pH, UA: 6.5

## 2015-11-30 NOTE — Progress Notes (Signed)
Pre visit review using our clinic review tool, if applicable. No additional management support is needed unless otherwise documented below in the visit note. 

## 2015-11-30 NOTE — Progress Notes (Signed)
Subjective:    Patient ID: Morgan [name], female    DOB: [birth year], 75 y.o.   MRN: [id number]  HPI  Morgan [name] is a 75 year old female who presents today with a chief complaint of urinary frequency. She also reports dysuria, darker urine color, flank pain, and foul odor. Her symptoms have been present for 1 week. Since 1 week ago her symptoms have become worse. Denies hematuria, vaginal discharge, fevers. She's been drinking cranberry juice recently and endorses that she does not drink enough water. She's not taken anything OTC for symptoms.   Review of Systems  Constitutional: Negative for fever and chills.  Gastrointestinal: Negative for nausea and abdominal pain.  Genitourinary: Positive for dysuria and flank pain. Negative for hematuria, vaginal discharge and pelvic pain.       Past Medical History  Diagnosis Date  . Hypertension   . High cholesterol   . Hyperlipidemia   . Angina   . Migraines     "til ~ 1980"  . Headache(784.0)     "recurring"  . Restless leg syndrome   . Weakness of right side of body     "I've had PT for it; they don't know what it's from".  CORTISONE INJECTION INTO BACK 08/30/12  . Diabetes mellitus     diet controlled/on meds  . Fibromyalgia   . Sciatic nerve pain     "from pinched nerve"  . Depression   . Atrial fibrillation (HCC)     ASPIRIN FOR BLOOD THINNER  . Coronary artery disease   . GERD (gastroesophageal reflux disease)   . H/O hiatal hernia   . Complication of anesthesia     pt states has choking sensation with ET tube   . PONV (postoperative nausea and vomiting)   . Arthritis   . Bulging discs     lumbar   . Sleep apnea     uses CPAP    Social History   Social History  . Marital Status: Married    Spouse Name: N/A  . Number of Children: N/A  . Years of Education: N/A   Occupational History  . Not on file.   Social History Main Topics  . Smoking status: Never Smoker   . Smokeless tobacco: Never Used  . Alcohol  Use: 0.6 oz/week    1 Glasses of wine per week     Comment: "occasionally drink wine"  . Drug Use: No  . Sexual Activity: Yes   Other Topics Concern  . Not on file   Social History Narrative    Past Surgical History  Procedure Laterality Date  . Coronary artery bypass graft  2005    CABG X 2  . Coronary angioplasty with stent placement  06/2011    "1"  . Nasal septum surgery  ~ 1986  . 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 Dilation and curettage of uterus      "more than once"  . Knee arthroscopy  09/04/2012    Procedure: ARTHROSCOPY KNEE;  Surgeon: Gearlean Alf, MD;  Location: [facility];  Service: Orthopedics;  Laterality: Right;  right knee arthroscopy with medial and lateral meniscus debridement  . Fracture surgery  ~ 2005    nose  . 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 Elbow  arthroscopy Left 07/02/2015    Procedure: ARTHROSCOPY LEFT ELBOW WITH DEBRIDEMENT AND REMOVAL LOOSE BODY;  Surgeon: Frederik Pear, MD;  Location: [place];  Service: Orthopedics;  Laterality: Left;  . Carpal tunnel release Left 07/02/2015    Procedure: CARPAL TUNNEL RELEASE;  Surgeon: Frederik Pear, MD;  Location: [place];  Service: Orthopedics;  Laterality: Left;    Family History  Problem Relation Age of Onset  . Cancer Mother 62    breast cancer  . Breast cancer Mother   . Heart disease Father 68    sudden onset due to CAD  . Cancer Sister 1    colon  . Hypertension Sister   . Dementia Sister   . Diabetes Sister   . Colon cancer Sister   . GER disease Daughter   . Hypertension Daughter   . Heart disease Brother   . Hypertension Brother   . Heart attack Neg Hx   . Stroke Neg Hx     Allergies  Allergen Reactions  . Hydrocodone-Acetaminophen Other (See  Comments)    "Changed personality" "made me very mean"  . Percocet [Oxycodone-Acetaminophen] Other (See Comments)    hallucination  . Sulfa Antibiotics Other (See Comments)    hallucinations    Current Outpatient Prescriptions on File Prior to Visit  Medication Sig Dispense Refill  . aspirin 81 MG tablet Take 1 tablet (81 mg total) by mouth daily.    [name] atorvastatin (LIPITOR) 80 MG tablet TAKE 1 TABLET EVERY DAY 90 tablet 1  . cetirizine (ZYRTEC) 10 MG tablet Take 10 mg by mouth daily.    . clonazePAM (KLONOPIN) 1 MG tablet TAKE 2 TABLETS BY MOUTH AT BEDTIME 60 tablet 0  . gabapentin (NEURONTIN) 300 MG capsule TAKE 1 TO 2 CAPSULES BY MOUTH AT BEDTIME 60 capsule 0  . glucose blood (ONE TOUCH ULTRA TEST) test strip Use to check blood sugar twice a day. DX: E11.9 100 each 5  . hydrochlorothiazide (HYDRODIURIL) 25 MG tablet TAKE 1 TABLET BY MOUTH DAILY 30 tablet 5  . Insulin Pen Needle 32G X 6 MM MISC Use to inject Victoza one time daily.  Dx: E11.9 90 each 3  . meloxicam (MOBIC) 15 MG tablet Take 15 mg by mouth daily.     . metformin (FORTAMET) 1000 MG (OSM) 24 hr tablet TAKE 1 TABLET BY MOUTH EVERY DAY AS DIRECTED . IN COMBO WITH 500G ER TABLETS 90 tablet 1  . metoprolol succinate (TOPROL-XL) 100 MG 24 hr tablet TAKE 1 TABLET BY MOUTH ONCE DAILY 30 tablet 5  . Multiple Vitamins-Minerals (PRESERVISION AREDS PO) Take 1 tablet by mouth 2 (two) times daily.     [name] NITROSTAT 0.4 MG SL tablet TAKE 1 TABLET UNDER THE TONGUE AS NEEDED 50 tablet 0  . ONETOUCH DELICA LANCETS 99991111 MISC USED TO TEST BLOOD SUGAR ONCE DAILY DX: 250.00 100 each 3  . pantoprazole (PROTONIX) 20 MG tablet TAKE 2 TABLETS BY MOUTH TWICE A DAY 360 tablet 1  . Probiotic Product (PROBIOTIC DAILY PO) Take 1 tablet by mouth 3 (three) times daily before meals.    . venlafaxine XR (EFFEXOR-XR) 37.5 MG 24 hr capsule Take 1 capsule (37.5 mg total) by mouth 2 (two) times daily. 180 capsule 1  . VICTOZA 18 MG/3ML SOPN INJECT 1.2 MG INTO THE  SKIN DAILY. 18 pen 1  . amoxicillin (AMOXIL) 500 MG capsule Take 500 mg by mouth as needed. Reported on 11/30/2015    . EPINEPHrine (EPI-PEN)  0.3 mg/0.3 mL SOAJ injection Inject into the muscle once as needed. Reported on 11/30/2015    . hydrocortisone (ANUSOL-HC) 2.5 % rectal cream Place 1 application rectally 2 (two) times daily. Use 2 times a day for 10 days (Patient not taking: Reported on 11/30/2015) 30 g 1   No current facility-administered medications on file prior to visit.    BP 124/84 mmHg  Pulse 64  Temp(Src) 97.7 F (36.5 C) (Oral)  Ht 5' 2.25" (1.581 m)  Wt 179 lb 12.8 oz (81.557 kg)  BMI 32.63 kg/m2  SpO2 97%    Objective:   Physical Exam  Constitutional: She appears well-nourished.  Cardiovascular: Normal rate and regular rhythm.   Pulmonary/Chest: Effort normal and breath sounds normal.  Abdominal: There is CVA tenderness.  CVA tenderness to right flank  Skin: Skin is warm and dry.          Assessment & Plan:    Urinary frequency, dysuria, discoloration x 1 week. Worse over past several days. No OTC treatment. Poor water hydration overall. UA: Negative. Suspect interstitial cystitis/bladder spasm, could also be dehydration. Increase consumption of water. She is to follow up if no improvement. Did send culture to ensure no bacterial growth.

## 2015-11-30 NOTE — Patient Instructions (Signed)
Your urine test does not show evidence of infection or any other abnormality.  Increase consumption of water. You should aim to consume 8 bottles of water daily.   Please notify me if no improvement in 1 week.   It was a pleasure to see you today!   Interstitial Cystitis Interstitial cystitis is a condition that causes inflammation of the bladder. The bladder is a hollow organ in the lower part of your abdomen. It stores urine after the urine is made by your kidneys. With interstitial cystitis, you may have pain in the bladder area. You may also have a frequent and urgent need to urinate. The severity of interstitial cystitis can vary from person to person. You may have flare-ups of the condition, and then it may go away for a while. For many people who have this condition, it becomes a long-term problem. CAUSES The cause of this condition is not known. RISK FACTORS This condition is more likely to develop in women. SYMPTOMS Symptoms of interstitial cystitis vary, and they can change over time. Symptoms may include:  Discomfort or pain in the bladder area. This can range from mild to severe. The pain may change in intensity as the bladder fills with urine or as it empties.  Pelvic pain.  An urgent need to urinate.  Frequent urination.  Pain during sexual intercourse.  Pinpoint bleeding on the bladder wall. For women, the symptoms often get worse during menstruation. DIAGNOSIS This condition is diagnosed by evaluating your symptoms and ruling out other causes. A physical exam will be done. Various tests may be done to rule out other conditions. Common tests include:  Urine tests.  Cystoscopy. In this test, a tool that is like a very thin telescope is used to look into your bladder.  Biopsy. This involves taking a sample of tissue from the bladder wall to be examined under a microscope. TREATMENT There is no cure for interstitial cystitis, but treatment methods are available to  control your symptoms. Work closely with your health care provider to find the treatments that will be most effective for you. Treatment options may include:  Medicines to relieve pain and to help reduce the number of times that you feel the need to urinate.  Bladder training. This involves learning ways to control when you urinate, such as:  Urinating at scheduled times.  Training yourself to delay urination.  Doing exercises (Kegel exercises) to strengthen the muscles that control urine flow.  Lifestyle changes, such as changing your diet or taking steps to control stress.  Use of a device that provides electrical stimulation in order to reduce pain.  A procedure that stretches your bladder by filling it with air or fluid.  Surgery. This is rare. It is only done for extreme cases if other treatments do not help. HOME CARE INSTRUCTIONS  Take medicines only as directed by your health care provider.  Use bladder training techniques as directed.  Keep a bladder diary to find out which foods, liquids, or activities make your symptoms worse.  Use your bladder diary to schedule bathroom trips. If you are away from home, plan to be near a bathroom at each of your scheduled times.  Make sure you urinate just before you leave the house and just before you go to bed.  Do Kegel exercises as directed by your health care provider.  Do not drink alcohol.  Do not use any tobacco products, including cigarettes, chewing tobacco, or electronic cigarettes. If you need help quitting,  ask your health care provider.  Make dietary changes as directed by your health care provider. You may need to avoid spicy foods and foods that contain a high amount of potassium.  Limit your drinking of beverages that stimulate urination. These include soda, coffee, and tea.  Keep all follow-up visits as directed by your health care provider. This is important. SEEK MEDICAL CARE IF:  Your symptoms do not get  better after treatment.  Your pain and discomfort are getting worse.  You have more frequent urges to urinate.  You have a fever. SEEK IMMEDIATE MEDICAL CARE IF:  You are not able to control your bladder at all.   This information is not intended to replace advice given to you by your health care provider. Make sure you discuss any questions you have with your health care provider.   Document Released: 06/30/2004 Document Revised: 11/20/2014 Document Reviewed: 07/07/2014 Elsevier Interactive Patient Education [employer].

## 2015-12-02 ENCOUNTER — Other Ambulatory Visit: Payer: Self-pay | Admitting: Primary Care

## 2015-12-02 ENCOUNTER — Telehealth: Payer: Self-pay | Admitting: Primary Care

## 2015-12-02 DIAGNOSIS — N390 Urinary tract infection, site not specified: Secondary | ICD-10-CM

## 2015-12-02 LAB — URINE CULTURE

## 2015-12-02 MED ORDER — CEPHALEXIN 250 MG PO CAPS: 250.0000 mg | ORAL_CAPSULE | Freq: Two times a day (BID) | ORAL | Status: DC

## 2015-12-02 NOTE — Telephone Encounter (Signed)
Patient returned Morgan [name]'s call.  Patient notified of lab results.

## 2015-12-03 NOTE — Telephone Encounter (Signed)
Called and notified patient of Kate's comments. Patient verbalized understanding.  

## 2015-12-08 ENCOUNTER — Other Ambulatory Visit: Payer: Self-pay | Admitting: Family Medicine

## 2015-12-08 NOTE — Telephone Encounter (Signed)
Last office visit 11/30/2015 with Allie Bossier.  Last refilled 10/28/2015 for #60 with no refills.  Ok to refill?

## 2015-12-09 NOTE — Telephone Encounter (Signed)
Clonazepam called into [facility] [address] 

## 2015-12-11 ENCOUNTER — Other Ambulatory Visit: Payer: Self-pay | Admitting: Family Medicine

## 2015-12-14 ENCOUNTER — Telehealth: Payer: Self-pay | Admitting: Cardiology

## 2015-12-14 DIAGNOSIS — G4733 Obstructive sleep apnea (adult) (pediatric): Secondary | ICD-10-CM

## 2015-12-14 NOTE — Sleep Study (Addendum)
   Patient Name: Faven, Baye MRN: [id number] Study Date: 11/30/2015 Gender: Female D.O.B: 1941/10/23 Age (years): 51 Referring Provider: Fransico Him MD, ABSM Interpreting Physician: Fransico Him MD, ABSM RPSGT: Joni Reining Weight (lbs): 175 BMI: 31 Height (inches): 63 Neck Size: 14.00   CLINICAL INFORMATION Sleep Study Type: NPSG Indication for sleep study: OSA   SLEEP STUDY TECHNIQUE As per the AASM Manual for the Scoring of Sleep and Associated Events v2.3 (April 2016) with a hypopnea requiring 4% desaturations. The channels recorded and monitored were frontal, central and occipital EEG, electrooculogram (EOG), submentalis EMG (chin), nasal and oral airflow, thoracic and abdominal wall motion, anterior tibialis EMG, snore microphone, electrocardiogram, and pulse oximetry.  MEDICATIONS Patient's medications include: Amoxicillin, ASA, Lipitor, Keflex, Zyrtec, Klonopin, Gabapentin, HCTZ, Meloxicam, Toprol, Effexor, Victoza, Protonix. Medications self-administered by patient during sleep study : No sleep medicine administered.  SLEEP ARCHITECTURE The study was initiated at 10:46:08 PM and ended at 4:48:12 AM. Sleep onset time was 82.3 minutes and the sleep efficiency was reduced at 54.0%. The total sleep time was 195.5 minutes. Stage REM latency was N/A minutes. The patient spent 14.58% of the night in stage N1 sleep, 68.54% in stage N2 sleep, 16.88% in stage N3 and 0.00% in REM. Alpha intrusion was absent. Supine sleep was 81.33% . RESPIRATORY PARAMETERS The overall apnea/hypopnea index (AHI) was 6.8 per hour. There were 3 total apneas, including 2 obstructive, 1 central and 0 mixed apneas. There were 19 hypopneas and 7 RERAs. The AHI during Stage REM sleep was N/A per hour. AHI while supine was 8.3 per hour. The mean oxygen saturation was 93.51%. The minimum SpO2 during sleep was 87.00%. Moderate snoring was noted during this study.  CARDIAC DATA The 2 lead EKG  demonstrated sinus rhythm. The mean heart rate was 65.47 beats per minute. Other EKG findings include: PVCs.  LEG MOVEMENT DATA The total PLMS were 190 with a resulting PLMS index of 58.31. Associated arousal with leg movement index was 8.9 .  IMPRESSIONS - Mild obstructive sleep apnea occurred during this study (AHI = 6.8/h). - No significant central sleep apnea occurred during this study (CAI = 0.3/h). - Mild oxygen desaturation was noted during this study (Min O2 = 87.00%).  The total time with oxygen desaturations < 88% was 0.2%. - The patient snored with Moderate snoring volume. - EKG findings include PVCs. - Severe periodic limb movements of sleep occurred during the study. Associated arousals were significant.  DIAGNOSIS - Obstructive Sleep Apnea (327.23 [G47.33 ICD-10])  RECOMMENDATIONS - Positional therapy avoiding supine position during sleep. - Very mild obstructive sleep apnea but patient had poor sleep efficiency and no REM sleep.  Consider repeat PSG with sleep aide vs. home sleep study. - Avoid alcohol, sedatives and other CNS depressants that may worsen sleep apnea and disrupt normal sleep architecture. - Sleep hygiene should be reviewed to assess factors that may improve sleep quality. - Weight management and regular exercise should be initiated or continued if appropriate.    Sueanne Margarita Diplomate, American Board of Sleep Medicine  ELECTRONICALLY SIGNED ON:  12/14/2015, 10:15 AM [place] PH: [phone]   FX: [phone] [place]

## 2015-12-14 NOTE — Telephone Encounter (Signed)
Patient had minimal OSA but had very poor sleep with no RE sleep  Please let patient know that options include repeat inpt sleep study with sleep aide vs. Home sleep study to reassess

## 2015-12-17 ENCOUNTER — Other Ambulatory Visit: Payer: Self-pay | Admitting: Family Medicine

## 2015-12-17 NOTE — Telephone Encounter (Signed)
Patient states that she is already on a CPAP machine with pressure setting 4cm H2O. This was for her to get a new machine. She said the reason she did not sleep was because she did not have her machine there with her. She stated that she sleeps VERY poorly without it.  She said that she took something for sleep at the lab before bed, so she does not feel that a sleep aide will really help since she relies so heavily on her CPAP at night.   Routing back to [facility] for Advice on how to proceed

## 2015-12-17 NOTE — Telephone Encounter (Signed)
Please find out from [facility] whether a home sleep study will suffice to document sleep apnea and quaify for new CPAP machine

## 2015-12-17 NOTE — Telephone Encounter (Signed)
Last office visit 11/30/2015 with Gentry Fitz.  Last refilled 11/18/2015 for #60 with no refills.  Ok to refill?

## 2015-12-20 NOTE — Telephone Encounter (Signed)
Request for advice from [facility] has been sent.   Once I have an answer on what we need to do I will call the patient and let her know.

## 2015-12-21 ENCOUNTER — Other Ambulatory Visit: Payer: Self-pay | Admitting: Family Medicine

## 2015-12-21 NOTE — Telephone Encounter (Signed)
We are going to try home sleep study to see if patient can sleep any better at home.  She is understanding of the process and realizes that I will call her back when I have the results of that study.

## 2015-12-21 NOTE — Telephone Encounter (Signed)
The following is from [facility] with [facility].  "I think a home study would be fine but we always recommend that the pt check with their insurance company to make sure they cover home studies."   I will call the patient to see what she would like the do.

## 2015-12-23 ENCOUNTER — Ambulatory Visit (AMBULATORY_SURGERY_CENTER): Payer: Medicare Other | Admitting: Gastroenterology

## 2015-12-23 ENCOUNTER — Encounter: Payer: Self-pay | Admitting: Gastroenterology

## 2015-12-23 VITALS — BP 167/85 | HR 72 | Temp 97.2°F | Resp 14 | Ht 62.5 in | Wt 176.0 lb

## 2015-12-23 DIAGNOSIS — K317 Polyp of stomach and duodenum: Secondary | ICD-10-CM | POA: Diagnosis not present

## 2015-12-23 DIAGNOSIS — K219 Gastro-esophageal reflux disease without esophagitis: Secondary | ICD-10-CM

## 2015-12-23 LAB — GLUCOSE, CAPILLARY
GLUCOSE-CAPILLARY: 113 mg/dL — AB (ref 65–99)
Glucose-Capillary: 78 mg/dL (ref 65–99)

## 2015-12-23 MED ORDER — SODIUM CHLORIDE 0.9 % IV SOLN: 500.0000 mL | INTRAVENOUS | Status: DC

## 2015-12-23 NOTE — Progress Notes (Signed)
Patient awakening,vss,report to rn 

## 2015-12-23 NOTE — Patient Instructions (Signed)
YOU HAD AN ENDOSCOPIC PROCEDURE TODAY AT [facility]:   Refer to the procedure report that was given to you for any specific questions about what was found during the examination.  If the procedure report does not answer your questions, please call your gastroenterologist to clarify.  If you requested that your care partner not be given the details of your procedure findings, then the procedure report has been included in a sealed envelope for you to review at your convenience later.   Please Note:  You might notice some irritation and congestion in your nose or some drainage.  This is from the oxygen used during your procedure.  There is no need for concern and it should clear up in a day or so.  SYMPTOMS TO REPORT IMMEDIATELY:    Following upper endoscopy (EGD)  Vomiting of blood or coffee ground material  New chest pain or pain under the shoulder blades  Painful or persistently difficult swallowing  New shortness of breath  Fever of 100F or higher  Black, tarry-looking stools  For urgent or emergent issues, a gastroenterologist can be reached at any hour by calling [phone].   DIET: Your first meal following the procedure should be a small meal and then it is ok to progress to your normal diet. Heavy or fried foods are harder to digest and may make you feel nauseous or bloated.  Likewise, meals heavy in dairy and vegetables can increase bloating.  Drink plenty of fluids but you should avoid alcoholic beverages for 24 hours.  ACTIVITY:  You should plan to take it easy for the rest of today and you should NOT DRIVE or use heavy machinery until tomorrow (because of the sedation medicines used during the test).    FOLLOW UP: Our staff will call the number listed on your records the next business day following your procedure to check on you and address any questions or concerns that you may have regarding the information given to you following your procedure. If we do  not reach you, we will leave a message.  However, if you are feeling well and you are not experiencing any problems, there is no need to return our call.  We will assume that you have returned to your regular daily activities without incident.  If any biopsies were taken you will be contacted by phone or by letter within the next 1-3 weeks.  Please call us at [phone] if you have not heard about the biopsies in 3 weeks.    SIGNATURES/CONFIDENTIALITY: You and/or your care partner have signed paperwork which will be entered into your electronic medical record.  These signatures attest to the fact that that the information above on your After Visit Summary has been reviewed and is understood.  Full responsibility of the confidentiality of this discharge information lies with you and/or your care-partner.  Await pathology Continue medication and diet today No NSAIDS for 2 weeks Baby Aspirin ok Take Protonix twice a day

## 2015-12-23 NOTE — Progress Notes (Signed)
Called to room to assist during endoscopic procedure.  Patient ID and intended procedure confirmed with present staff. Received instructions for my participation in the procedure from the performing physician.  

## 2015-12-23 NOTE — Op Note (Signed)
[place]  [address] [place], [id number]   ENDOSCOPY PROCEDURE REPORT  PATIENT: Morgan, [name]  MR#: [id number] BIRTHDATE: Aug 10, 1941 , 74  yrs. old GENDER: female ENDOSCOPIST: Yetta Flock, MD REFERRED BY: PROCEDURE DATE:  12/23/2015 PROCEDURE:  EGD w/ snare polypectomy and EGD w/ biopsy ASA CLASS:     Class III INDICATIONS:  persistent heartburn despite twice daily PPI, and early satiety. MEDICATIONS: Propofol 250 mg IV TOPICAL ANESTHETIC:  DESCRIPTION OF PROCEDURE: After the risks benefits and alternatives of the procedure were thoroughly explained, informed consent was obtained.  The LB [id number] endoscope was introduced through the mouth and advanced to the second portion of the duodenum , Without limitations.  The instrument was slowly withdrawn as the mucosa was fully examined.  FINDINGS: The esophagus was normal.  DH noted at 38cm from the incisors with SCJ and GEJ located 36cm from the incisors, with a 2cm hiatal hernia.  The stomach was remarkable for too numerous to count gastric polyps in the gastric body and fundus, consistent with benign gastric fundic gland polyps.  The largest were 1-2cm in size.  Some of the polyps in the distal body were erythematous but not ulcerated.  The polyps that appeared erythematous with potential for bleeding were removed, as were the largest polyps, as a representative sample, with hot snare.  Biopsies were taken of the stomach otherwise to rule out H pylori.  The duodenal bulb and 2nd portion of the examined duodenum were normal.  Retroflexed views revealed no abnormalities.     The scope was then withdrawn from the patient and the procedure completed. COMPLICATIONS: There were no immediate complications.  ENDOSCOPIC IMPRESSION: Normal esophagus 2cm hiatal hernia Too numerous to count suspected gastric fundic gland polyps, the largest and inflamed polyps were removed as a representative  sample Normal stomach otherwise, biopsies obtained Normal duodenum  RECOMMENDATIONS: Await pathology results Continue medications No NSAIDS for 2 weeks, but baby aspirin is okay Resume diet Recommend 24 HR pH impedance testing to further evaluate symptoms of refractory reflux while on twice daily protonix    eSigned:  Yetta Flock, MD 12/23/2015 3:39 PM    CC: the patient  PATIENT NAME:  Morgan, [name] MR#: [id number]

## 2015-12-24 ENCOUNTER — Telehealth: Payer: Self-pay | Admitting: Emergency Medicine

## 2015-12-24 ENCOUNTER — Telehealth: Payer: Self-pay | Admitting: *Deleted

## 2015-12-24 ENCOUNTER — Encounter: Payer: Self-pay | Admitting: *Deleted

## 2015-12-24 ENCOUNTER — Telehealth: Payer: Self-pay

## 2015-12-24 NOTE — Telephone Encounter (Signed)
Left message, no identifier present per f/u

## 2015-12-24 NOTE — Telephone Encounter (Signed)
Randall Hiss RN spoke with pt and clarified Victoza- she was confused if she is to stop her Victoza.  Reviewed medications with pt and all clarified

## 2015-12-24 NOTE — Telephone Encounter (Signed)
Spoke with patient and gave her appointment date and time. Mailed her instructions. Patient had questions about instructions from yesterday. Transferred her to [facility].

## 2015-12-24 NOTE — Telephone Encounter (Signed)
Scheduled 24 hour PH probe on 12/29/15 at 12:30 [id number]). Left a message for patient to call back.

## 2015-12-28 ENCOUNTER — Telehealth: Payer: Self-pay | Admitting: Gastroenterology

## 2015-12-28 NOTE — Telephone Encounter (Signed)
Spoke with patient and went over instructions for Ph study that is scheduled tomorrow.

## 2015-12-29 ENCOUNTER — Ambulatory Visit (HOSPITAL_COMMUNITY)
Admission: RE | Admit: 2015-12-29 | Discharge: 2015-12-29 | Disposition: A | Discharge status: Home / Self Care | Payer: Medicare Other | Source: Ambulatory Visit | Attending: Gastroenterology | Admitting: Gastroenterology

## 2015-12-29 ENCOUNTER — Encounter (HOSPITAL_COMMUNITY)
Admission: RE | Disposition: A | Discharge status: Home / Self Care | Payer: Self-pay | Source: Ambulatory Visit | Attending: Gastroenterology

## 2015-12-29 ENCOUNTER — Other Ambulatory Visit: Payer: Self-pay | Admitting: Family Medicine

## 2015-12-29 DIAGNOSIS — R12 Heartburn: Secondary | ICD-10-CM

## 2015-12-29 HISTORY — PX: 24 HOUR PH STUDY: SHX5419

## 2015-12-29 HISTORY — PX: ESOPHAGEAL MANOMETRY: SHX5429

## 2015-12-29 SURGERY — MONITORING, ESOPHAGEAL PH, 24 HOUR

## 2015-12-29 MED ORDER — LIDOCAINE VISCOUS 2 % MT SOLN
OROMUCOSAL | Status: AC
  Filled 2015-12-29: qty 15

## 2015-12-29 SURGICAL SUPPLY — 2 items
FACESHIELD LNG OPTICON STERILE (SAFETY) IMPLANT
GLOVE BIO SURGEON STRL SZ8 (GLOVE) ×4 IMPLANT

## 2015-12-30 ENCOUNTER — Encounter (HOSPITAL_COMMUNITY): Payer: Self-pay | Admitting: Gastroenterology

## 2015-12-31 NOTE — Telephone Encounter (Signed)
Based on the home sleep study with an AHI of 5.7, a replacement machine is being ordered at 4cm H2O.  Spoke with [facility] yesterday and was told that this would be okay.  Patient is aware that I am sending in the order and that [facility] will be calling her to set up.

## 2015-12-31 NOTE — Addendum Note (Signed)
Addended by: Andres Ege on: 12/31/2015 10:51 AM   Modules accepted: Orders

## 2016-01-03 ENCOUNTER — Encounter: Payer: Self-pay | Admitting: Cardiology

## 2016-01-11 ENCOUNTER — Telehealth: Payer: Self-pay | Admitting: Gastroenterology

## 2016-01-11 DIAGNOSIS — K219 Gastro-esophageal reflux disease without esophagitis: Secondary | ICD-10-CM

## 2016-01-11 NOTE — Telephone Encounter (Signed)
Patient had esophageal manometry and 24 HR pH impedance study done to evaluate ongoing symptoms of reflux despite PPI.  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   Recommend:

## 2016-01-13 MED ORDER — DEXLANSOPRAZOLE 60 MG PO CPDR: 60.0000 mg | DELAYED_RELEASE_CAPSULE | Freq: Every day | ORAL | Status: DC

## 2016-01-13 NOTE — Telephone Encounter (Signed)
Patient with pH study and manometry as outlined below. She is having breakthrough acid reflux on twice daily protonix. She has been on zantac and nexium previously which did not help too much. protonix did help for a while but now having breakthrough. She has had a prior gastric emptying study which was normal. Options at this time are a trial of another PPI such as Dexilant, consider alternative agent such as baclofen, or consider a surgical evaluation for reflux. I would recommend trying her on Dexilant for now and see how she does. We discussed that she does have large fundic gland polyps in her stomach, which should not have cancerous potential and had several that were removed on EGD, and long term would like to reduce her PPI, however she is very symptomatic from reflux right now and wanted to try to the [place]. In this light we will stop protonix and put her on Dexilant for 4 weeks to see if it helps. She declined baclofen right now.   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 She does endorse periodic chest pains when eating which I suspect is related to this or her GERD. We could consider Ca channel blocker but she is already on a beta blocker. We will try some peppermint althoids instead, chew 2 tabs a few times per day and PRN for chest pain to see if this helps. She can call us in 1 month for reassessment. She agreed, all questions answered.

## 2016-01-18 ENCOUNTER — Other Ambulatory Visit: Payer: Self-pay | Admitting: Family Medicine

## 2016-01-18 NOTE — Telephone Encounter (Signed)
Clonazepam called into [facility] [address] 

## 2016-01-18 NOTE — Telephone Encounter (Signed)
Last office visit 11/30/2015 with Gentry Fitz to UTI.  Last refilled 12/09/2015 for #60 with no refills.  Ok to refill?

## 2016-01-24 ENCOUNTER — Ambulatory Visit (INDEPENDENT_AMBULATORY_CARE_PROVIDER_SITE_OTHER): Payer: Medicare Other | Admitting: Podiatry

## 2016-01-24 ENCOUNTER — Encounter: Payer: Self-pay | Admitting: Podiatry

## 2016-01-24 DIAGNOSIS — B351 Tinea unguium: Secondary | ICD-10-CM

## 2016-01-24 DIAGNOSIS — M79676 Pain in unspecified toe(s): Secondary | ICD-10-CM

## 2016-01-24 NOTE — Progress Notes (Signed)
She presents today for diabetic checkup and for routine nail debridement.  Objective: Vital signs are stable she is alert 3 pulses are palpable capillary fill time is immediate. Neurologic sensorium is intact. Toenails are thick yellow dystrophic onychomycotic. No open lesions or wounds.  Assessment: Pain and limp secondary to onychomycosis 1 through 5 bilateral.  Plan: Debridement of toenails 1 through 5 bilateral.

## 2016-01-31 ENCOUNTER — Telehealth: Payer: Self-pay | Admitting: Cardiology

## 2016-01-31 NOTE — Telephone Encounter (Signed)
Mrs. Trueman is calling because she has received her new C-pap machine ,,   Thanks

## 2016-01-31 NOTE — Telephone Encounter (Signed)
Follow-up appointment made with patient.

## 2016-02-03 ENCOUNTER — Telehealth: Payer: Self-pay | Admitting: Family Medicine

## 2016-02-03 ENCOUNTER — Other Ambulatory Visit (INDEPENDENT_AMBULATORY_CARE_PROVIDER_SITE_OTHER): Payer: Medicare Other

## 2016-02-03 DIAGNOSIS — E119 Type 2 diabetes mellitus without complications: Secondary | ICD-10-CM

## 2016-02-03 LAB — COMPREHENSIVE METABOLIC PANEL
ALT: 25 U/L (ref 0–35)
AST: 20 U/L (ref 0–37)
Albumin: 3.8 g/dL (ref 3.5–5.2)
Alkaline Phosphatase: 71 U/L (ref 39–117)
BILIRUBIN TOTAL: 0.5 mg/dL (ref 0.2–1.2)
BUN: 19 mg/dL (ref 6–23)
CO2: 35 meq/L — AB (ref 19–32)
CREATININE: 0.91 mg/dL (ref 0.40–1.20)
Calcium: 9.4 mg/dL (ref 8.4–10.5)
Chloride: 101 mEq/L (ref 96–112)
GFR: 64.25 mL/min (ref >60.00)
GLUCOSE: 137 mg/dL — AB (ref 70–99)
Potassium: 3.8 mEq/L (ref 3.5–5.1)
Sodium: 142 mEq/L (ref 135–145)
Total Protein: 6.6 g/dL (ref 6.0–8.3)

## 2016-02-03 LAB — LIPID PANEL
CHOL/HDL RATIO: 3
Cholesterol: 142 mg/dL (ref 0–200)
HDL: 46.7 mg/dL (ref >39.00)
LDL CALC: 77 mg/dL (ref 0–99)
NONHDL: 95.54
Triglycerides: 91 mg/dL (ref 0.0–149.0)
VLDL: 18.2 mg/dL (ref 0.0–40.0)

## 2016-02-03 LAB — HEMOGLOBIN A1C: Hgb A1c MFr Bld: 7.1 % — ABNORMAL HIGH (ref 4.6–6.5)

## 2016-02-03 NOTE — Telephone Encounter (Signed)
-----   Message from Ellamae Sia sent at 01/26/2016 11:37 AM EDT ----- Regarding: Lab orders for Thursday, 3.23.17 Lab orders for a DM f/u

## 2016-02-08 ENCOUNTER — Ambulatory Visit (INDEPENDENT_AMBULATORY_CARE_PROVIDER_SITE_OTHER): Payer: Medicare Other | Admitting: Family Medicine

## 2016-02-08 ENCOUNTER — Encounter: Payer: Self-pay | Admitting: Family Medicine

## 2016-02-08 VITALS — BP 132/64 | HR 67 | Temp 97.8°F | Ht 62.25 in | Wt 171.4 lb

## 2016-02-08 DIAGNOSIS — I10 Essential (primary) hypertension: Secondary | ICD-10-CM

## 2016-02-08 DIAGNOSIS — E7800 Pure hypercholesterolemia, unspecified: Secondary | ICD-10-CM

## 2016-02-08 DIAGNOSIS — E119 Type 2 diabetes mellitus without complications: Secondary | ICD-10-CM | POA: Diagnosis not present

## 2016-02-08 NOTE — Progress Notes (Signed)
75 year old female presents for DM follow up.  Diabetes: Good control on metformin, victoza 1.2 mcg daily Lab Results  Component Value Date   HGBA1C 7.1* 02/03/2016  Using medications without difficulties:none Hypoglycemic episodes:None Hyperglycemic episodes:None Feet problems:None, thickened toenails, seeing podiatrist Blood Sugars averaging: FBS 108-156, 2 hours meals: not checking eye exam within last year: yes Exercise: none   Wt Readings from Last 3 Encounters:  02/08/16 171 lb 6.4 oz (77.747 kg)  12/23/15 176 lb (79.833 kg)  11/30/15 175 lb (79.379 kg)    She is at goal LDL < 70 on lipitor 80 mg daily given CAD, TIA history.  Lab Results  Component Value Date   CHOL 142 02/03/2016   HDL 46.70 02/03/2016   LDLCALC 77 02/03/2016   TRIG 91.0 02/03/2016   CHOLHDL 3 02/03/2016  She has been placed on dexilant. Recent endoscopy showed numerous ppolyps to numerous to count. She is doing much better on this med.  Review of Systems  Constitutional: Negative for fever and fatigue.  HENT: Negative for ear pain.  Eyes: Negative for pain.  Respiratory: Negative for chest tightness and shortness of breath.  Cardiovascular: Negative for chest pain, palpitations and leg swelling.  Gastrointestinal: improved Genitourinary: Negative for dysuria.  Objective:   Physical Exam  Constitutional: She is oriented to person, place, and time. Vital signs are normal. She appears well-developed and well-nourished. She is cooperative. Non-toxic appearance. She does not appear ill. No distress.  HENT:  Head: Normocephalic.  Right Ear: Hearing, tympanic membrane, external ear and ear canal normal. Tympanic membrane is not erythematous, not retracted and not bulging.  Left Ear: Hearing, tympanic membrane, external ear and ear canal normal. Tympanic membrane is not erythematous, not retracted and not bulging.  Nose: No mucosal edema or rhinorrhea. Right sinus exhibits no maxillary  sinus tenderness and no frontal sinus tenderness. Left sinus exhibits no maxillary sinus tenderness and no frontal sinus tenderness.  Mouth/Throat: Uvula is midline, oropharynx is clear and moist and mucous membranes are normal.  Eyes: Conjunctivae, EOM and lids are normal. Pupils are equal, round, and reactive to light. Lids are everted and swept, no foreign bodies found.  Neck: Trachea normal and normal range of motion. Neck supple. Carotid bruit is not present. No mass and no thyromegaly present.  Cardiovascular: Normal rate, regular rhythm, S1 normal, S2 normal, normal heart sounds, intact distal pulses and normal pulses. Exam reveals no gallop and no friction rub.  No murmur heard.  Pulmonary/Chest: Effort normal and breath sounds normal. Not tachypneic. No respiratory distress. She has no decreased breath sounds. She has no wheezes. She has no rhonchi. She has no rales.  Abdominal: Soft. Normal appearance and bowel sounds are normal. There is no tenderness.  Neurological: She is alert and oriented to person, place, and time. She has normal strength and normal reflexes. No cranial nerve deficit or sensory deficit. She exhibits normal muscle tone. She displays a negative Romberg sign. Coordination and gait normal. GCS eye subscore is 4. GCS verbal subscore is 5. GCS motor subscore is 6.  Nml cerebellar exam  No papilledema  Skin: Skin is warm, dry and intact. No rash noted.  Psychiatric: She has a normal mood and affect. Her speech is normal and behavior is normal. Judgment and thought content normal. Her mood appears not anxious. Cognition and memory are normal. Cognition and memory are not impaired. She does not exhibit a depressed mood. She exhibits normal recent memory and normal remote memory.  Diabetic foot exam: Normal inspection No skin breakdown No calluses  Normal DP pulses Normal sensation to light touch and monofilament Nails thickened

## 2016-02-08 NOTE — Assessment & Plan Note (Signed)
Slight worsening in control. Increase victoza to 1.8 mg daily. Encouraged exercise, weight loss, healthy eating habits. Follow up in 3 months.

## 2016-02-08 NOTE — Patient Instructions (Signed)
Work on  starting exercise and work on [employer].  Increase victoza to 1.8 mcg daily.

## 2016-02-08 NOTE — Assessment & Plan Note (Signed)
At goal on lipitor 80 mg daily.

## 2016-02-08 NOTE — Assessment & Plan Note (Signed)
Well controlled. Continue current medication.  

## 2016-02-08 NOTE — Progress Notes (Signed)
Pre visit review using our clinic review tool, if applicable. No additional management support is needed unless otherwise documented below in the visit note. 

## 2016-02-09 ENCOUNTER — Other Ambulatory Visit: Payer: Self-pay | Admitting: *Deleted

## 2016-02-09 MED ORDER — METOPROLOL SUCCINATE ER 100 MG PO TB24: 100.0000 mg | ORAL_TABLET | Freq: Every day | ORAL | Status: DC

## 2016-02-09 MED ORDER — HYDROCHLOROTHIAZIDE 25 MG PO TABS: 25.0000 mg | ORAL_TABLET | Freq: Every day | ORAL | Status: DC

## 2016-02-09 NOTE — Telephone Encounter (Signed)
Received [facility] from [facility] requesting 90 day supply.

## 2016-02-14 ENCOUNTER — Other Ambulatory Visit: Payer: Self-pay | Admitting: Family Medicine

## 2016-02-14 NOTE — Telephone Encounter (Signed)
Last office visit 02/08/2016.  Last refilled 12/17/2015 for #60 with no refills.  Ok to refill?

## 2016-02-17 ENCOUNTER — Encounter: Payer: Self-pay | Admitting: Family Medicine

## 2016-02-17 ENCOUNTER — Other Ambulatory Visit: Payer: Self-pay | Admitting: Family Medicine

## 2016-03-01 ENCOUNTER — Other Ambulatory Visit: Payer: Self-pay | Admitting: Family Medicine

## 2016-03-01 NOTE — Telephone Encounter (Signed)
Last office visit 02/08/2016.  Last refilled 01/18/2016 for #60 with no refills.  Ok to refill?

## 2016-03-02 ENCOUNTER — Other Ambulatory Visit: Payer: Self-pay | Admitting: Family Medicine

## 2016-03-02 NOTE — Telephone Encounter (Signed)
[facility]azepam [facility] [address]

## 2016-03-10 ENCOUNTER — Other Ambulatory Visit: Payer: Self-pay | Admitting: Gastroenterology

## 2016-03-14 ENCOUNTER — Ambulatory Visit (INDEPENDENT_AMBULATORY_CARE_PROVIDER_SITE_OTHER): Payer: Medicare Other | Admitting: Cardiology

## 2016-03-14 ENCOUNTER — Encounter: Payer: Self-pay | Admitting: Cardiology

## 2016-03-14 VITALS — BP 122/74 | HR 72 | Ht 63.0 in | Wt 170.0 lb

## 2016-03-14 DIAGNOSIS — G4733 Obstructive sleep apnea (adult) (pediatric): Secondary | ICD-10-CM | POA: Diagnosis not present

## 2016-03-14 DIAGNOSIS — I10 Essential (primary) hypertension: Secondary | ICD-10-CM | POA: Diagnosis not present

## 2016-03-14 DIAGNOSIS — E669 Obesity, unspecified: Secondary | ICD-10-CM

## 2016-03-14 NOTE — Patient Instructions (Signed)

## 2016-03-14 NOTE — Progress Notes (Signed)
Cardiology Office Note    Date:  03/14/2016   ID:  Modesta Everest, DOB 07-09-1941, MRN [place]:[id number]  PCP:  Eliezer Lofts, MD  Cardiologist:  Sueanne Margarita, MD   Chief Complaint  Patient presents with  . Sleep Apnea  . Hypertension    History of Present Illness:  Chalanda Vivolo is a 74 y.o. female with a history of OSA, HTN and obesity who presents today for followup. She uses a full face mask which she tolerates but she is going to try the nasal mask. She sleeps well at night, feels rested in the am and has no daytime sleepiness.  She sleeps on her back.    Past Medical History  Diagnosis Date  . Hypertension   . High cholesterol   . Hyperlipidemia   . Angina   . Migraines     "til ~ 1980"  . Headache(784.0)     "recurring"  . Restless leg syndrome   . Weakness of right side of body     "I've had PT for it; they don't know what it's from".  CORTISONE INJECTION INTO BACK 08/30/12  . Diabetes mellitus     diet controlled/on meds  . Fibromyalgia   . Sciatic nerve pain     "from pinched nerve"  . Depression   . Atrial fibrillation (HCC)     ASPIRIN FOR BLOOD THINNER  . Coronary artery disease   . GERD (gastroesophageal reflux disease)   . H/O hiatal hernia   . Complication of anesthesia     pt states has choking sensation with ET tube   . PONV (postoperative nausea and vomiting)   . Arthritis   . Bulging discs     lumbar   . Sleep apnea     uses CPAP    Past Surgical History  Procedure Laterality Date  . Coronary artery bypass graft  2005    CABG X 2  . Coronary angioplasty with stent placement  06/2011    "1"  . Nasal septum surgery  ~ 1986  . 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 Dilation and curettage of uterus      "more than once"  . Knee arthroscopy  09/04/2012    Procedure: ARTHROSCOPY KNEE;  Surgeon: Gearlean Alf, MD;  Location: [facility];  Service: Orthopedics;  Laterality: Right;  right knee  arthroscopy with medial and lateral meniscus debridement  . Fracture surgery  ~ 2005    nose  . 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 Elbow arthroscopy Left 07/02/2015    Procedure: ARTHROSCOPY LEFT ELBOW WITH DEBRIDEMENT AND REMOVAL LOOSE BODY;  Surgeon: Frederik Pear, MD;  Location: [place];  Service: Orthopedics;  Laterality: Left;  . Carpal tunnel release Left 07/02/2015    Procedure: CARPAL TUNNEL RELEASE;  Surgeon: Frederik Pear, MD;  Location: [place];  Service: Orthopedics;  Laterality: Left;  . 24 hour ph study N/A 12/29/2015    Procedure: 24 HOUR PH STUDY;  Surgeon: Manus Gunning, MD;  [facility];  Service: Gastroenterology;  Laterality: N/A;  . Esophageal manometry N/A 12/29/2015    Procedure: ESOPHAGEAL MANOMETRY (EM);  Surgeon: Manus Gunning, MD;  [facility];  Service: Gastroenterology;  Laterality: N/A;    Current Medications: Outpatient  Prescriptions Prior to Visit  Medication Sig Dispense Refill  . amoxicillin (AMOXIL) 500 MG capsule Take 500 mg by mouth as directed. Prior to dental procedures.  Reported on 12/23/2015    . aspirin 81 MG tablet Take 1 tablet (81 mg total) by mouth daily.    [name] atorvastatin (LIPITOR) 80 MG tablet Take 80 mg by mouth daily.    . cetirizine (ZYRTEC) 10 MG tablet Take 10 mg by mouth daily. Reported on 12/23/2015    . clonazePAM (KLONOPIN) 1 MG tablet Take 2 mg by mouth at bedtime. Take 2 tablets by mouth daily at bedtime    . DEXILANT 60 MG capsule TAKE 1 CAPSULE (60 MG TOTAL) BY MOUTH DAILY. 60 capsule 0  . gabapentin (NEURONTIN) 300 MG capsule TAKE 1 TO 2 CAPSULES BY MOUTH AT BEDTIME 60 capsule 1  . hydrochlorothiazide (HYDRODIURIL) 25 MG tablet Take 1 tablet (25 mg total) by mouth daily. 90 tablet 1  . meloxicam (MOBIC) 15 MG tablet Take  15 mg by mouth daily. Reported on 12/23/2015    . metformin (FORTAMET) 1000 MG (OSM) 24 hr tablet Take 1,000 mg by mouth 2 (two) times daily with a meal.    . metoprolol succinate (TOPROL-XL) 100 MG 24 hr tablet Take 1 tablet (100 mg total) by mouth daily. Take with or immediately following a meal. 90 tablet 1  . Multiple Vitamins-Minerals (PRESERVISION AREDS PO) Take 1 tablet by mouth 2 (two) times daily. Reported on 12/23/2015    . nitroGLYCERIN (NITROSTAT) 0.4 MG SL tablet Place 0.4 mg under the tongue every 5 (five) minutes as needed for chest pain.    . ONE TOUCH ULTRA TEST test strip CHECK BLOOD SUGAR TWICE A DAY 100 each 5  . ONETOUCH DELICA LANCETS 99991111 MISC USED TO TEST BLOOD SUGAR ONCE DAILY DX: 250.00 100 each 3  . Probiotic Product (PROBIOTIC DAILY PO) Take 1 tablet by mouth 3 (three) times daily before meals.    . venlafaxine XR (EFFEXOR-XR) 37.5 MG 24 hr capsule Take 1 capsule (37.5 mg total) by mouth 2 (two) times daily. 180 capsule 1  . VICTOZA 18 MG/3ML SOPN INJECT 1.2 MG INTO THE SKIN DAILY. 6 pen 2  . atorvastatin (LIPITOR) 80 MG tablet TAKE 1 TABLET EVERY DAY 90 tablet 1  . clonazePAM (KLONOPIN) 1 MG tablet TAKE 2 TABLETS AT BEDTIME 60 tablet 0  . EPINEPHrine (EPI-PEN) 0.3 mg/0.3 mL SOAJ injection Inject into the muscle once as needed. Reported on 12/23/2015    . hydrocortisone (ANUSOL-HC) 2.5 % rectal cream Place 1 application rectally 2 (two) times daily. Use 2 times a day for 10 days 30 g 1  . metformin (FORTAMET) 1000 MG (OSM) 24 hr tablet TAKE 1 TABLET TWICE A DAY 90 tablet 1  . NITROSTAT 0.4 MG SL tablet TAKE 1 TABLET UNDER THE TONGUE AS NEEDED 50 tablet 0   No facility-administered medications prior to visit.     Allergies:   Hydrocodone-acetaminophen; Percocet; and Sulfa antibiotics   Social History   Social History  . Marital Status: Married    Spouse Name: N/A  . Number of Children: N/A  . Years of Education: N/A   Social History Main Topics  . Smoking status:  Never Smoker   . Smokeless tobacco: Never Used  . Alcohol Use: 0.6 oz/week    1 Glasses of wine per week     Comment: "occasionally drink wine"  . Drug Use: No  . Sexual Activity: Yes   Other  Topics Concern  . None   Social History Narrative     Family History:  The patient's family history includes Breast cancer in her mother; Cancer (age of onset: 12) in her mother and sister; Colon cancer in her sister; Dementia in her sister; Diabetes in her sister; GER disease in her daughter; Heart disease in her brother; Heart disease (age of onset: 14) in her father; Hypertension in her brother, daughter, and sister. There is no history of Heart attack or Stroke.   ROS:   Please see the history of present illness.    ROS All other systems reviewed and are negative.   PHYSICAL EXAM:   VS:  BP 122/74 mmHg  Pulse 72  Ht 5\' 3"  (1.6 m)  Wt 170 lb (77.111 kg)  BMI 30.12 kg/m2   GEN: Well nourished, well developed, in no acute distress HEENT: normal Neck: no JVD, carotid bruits, or masses Cardiac: RRR; no murmurs, rubs, or gallops,no edema.  Intact distal pulses bilaterally.  Respiratory:  clear to auscultation bilaterally, normal work of breathing GI: soft, nontender, nondistended, + BS MS: no deformity or atrophy Skin: warm and dry, no rash Neuro:  Alert and Oriented x 3, Strength and sensation are intact Psych: euthymic mood, full affect  Wt Readings from Last 3 Encounters:  03/14/16 170 lb (77.111 kg)  02/08/16 171 lb 6.4 oz (77.747 kg)  12/23/15 176 lb (79.833 kg)      Studies/Labs Reviewed:   EKG:  EKG is not ordered today.  T  Recent Labs: 09/24/2015: Hemoglobin 12.1; Platelets 308.0 02/03/2016: ALT 25; BUN 19; Creatinine, Ser 0.91; Potassium 3.8; Sodium 142   Lipid Panel    Component Value Date/Time   CHOL 142 02/03/2016 0928   TRIG 91.0 02/03/2016 0928   HDL 46.70 02/03/2016 0928   CHOLHDL 3 02/03/2016 0928   VLDL 18.2 02/03/2016 0928   LDLCALC 77 02/03/2016 0928      Additional studies/ records that were reviewed today include:  CPAP D/L    ASSESSMENT:    1. Obstructive sleep apnea   2. Essential hypertension   3. Obesity (BMI 30-39.9)      PLAN:  In order of problems listed above:  1. OSA - she is tolerating her CPAP well.  Her d/l today showed an AHi of 18.5/hr on 4cm H2O and she is 97% compliant in using more than 4 hours nightly.  Patient has been using and benefiting from CPAP use and will continue to benefit from therapy.  Her AHI is too high today so I will get a 2 week autotitration from 4 to 18cm H2o.    2. HTN - Bp well controlled on current regimen.   Continue HCTZ/BB 3. Obesity - I have encouraged him to get into a routine exercise program and cut back on carbs and portions.   Followup with me in 6 months    Medication Adjustments/Labs and Tests Ordered: Current medicines are reviewed at length with the patient today.  Concerns regarding medicines are outlined above.  Medication changes, Labs and Tests ordered today are listed in the Patient Instructions below.   Lurena Nida, MD  03/14/2016 11:17 AM    [facility] [place], [place],   [address] Phone: [phone]; Fax: [phone]

## 2016-03-15 ENCOUNTER — Ambulatory Visit: Payer: Medicare Other | Admitting: Cardiology

## 2016-03-20 ENCOUNTER — Encounter: Payer: Self-pay | Admitting: Cardiology

## 2016-03-28 ENCOUNTER — Ambulatory Visit (INDEPENDENT_AMBULATORY_CARE_PROVIDER_SITE_OTHER): Payer: Medicare Other | Admitting: Cardiology

## 2016-03-28 ENCOUNTER — Encounter: Payer: Self-pay | Admitting: Cardiology

## 2016-03-28 VITALS — BP 126/78 | HR 79 | Ht 63.0 in | Wt 167.8 lb

## 2016-03-28 DIAGNOSIS — R079 Chest pain, unspecified: Secondary | ICD-10-CM

## 2016-03-28 DIAGNOSIS — I10 Essential (primary) hypertension: Secondary | ICD-10-CM

## 2016-03-28 DIAGNOSIS — I2583 Coronary atherosclerosis due to lipid rich plaque: Secondary | ICD-10-CM

## 2016-03-28 DIAGNOSIS — G4733 Obstructive sleep apnea (adult) (pediatric): Secondary | ICD-10-CM

## 2016-03-28 DIAGNOSIS — I2510 Atherosclerotic heart disease of native coronary artery without angina pectoris: Secondary | ICD-10-CM | POA: Diagnosis not present

## 2016-03-28 NOTE — Patient Instructions (Signed)
Medication Instructions:  The current medical regimen is effective;  continue present plan and medications.  Testing/Procedures: Your physician has requested that you have a myoview. For further information please visit [email or web address]. Please follow instruction sheet, as given.  Follow-Up: Follow up in 6 months with Dr. Marlou Porch.  You will receive a letter in the mail 2 months before you are due.  Please call us when you receive this letter to schedule your follow up appointment.  If you need a refill on your cardiac medications before your next appointment, please call your pharmacy.  Thank you for choosing [facility]!!

## 2016-03-28 NOTE — Progress Notes (Signed)
[place]. [address] [place], [place]  [address] Phone: [phone] Fax:  [phone]  Date:  03/28/2016   ID:  Morgan [name], DOB Jan 15, 1941, MRN [place]:[id number]  PCP:  Eliezer Lofts, MD   History of Present Illness: Morgan [name] is a 75 y.o. female with coronary artery disease status post bypass surgery in 2006, diagonal stent, DM, cerebellar TIA 2015 here for followup.   Had episode of angioedema (testing for allergies has been done - negative except for cockroach) in April 2014, I stopped her losartan because of the small chance of cross reactivity/angioedema. Her blood pressure today was excellent. Cough has improved. ENT [facility]).   In review of her coronary disease, in April of 2012 she had cardiac catheterization with drug-eluting stent the first diagonal branch that had previously been determined to small for bypass in 2006. She completed cardiac rehabilitation. Approximately a year later she was having chest discomfort moderate in intensity but mostly leg pain/rheumatologic complaints. She underwent nuclear stress test on 02/05/12 which was low risk showing only possible subtle ischemia in the distal anterolateral segment. Normal ejection fraction.   Hyperlipidemia-prior LDL 58 on 8/14. Excellent. Blood pressure also under good control.   Glucose mildly elevated. Hemoglobin A1C was 8.4.  Knee replacement.    Had cerebellar TIA 2015. Nausea, flashes in front of eye. Working on diabetes. Metformin, diarrhea. Hemoglobin A1c 8.2. Excellent LDL 59. Mild bilateral carotid plaque.  Sleep apnea - feels real tired, sleeps at the drop of a hat. Had CP, hard to tell if GERD. GI work up. Dr. Havery Moros. On coctail to decrease H pylori.   Morgan [name], husband prostate cancer is back. Took 7 grandkids to [employer].   Wt Readings from Last 3 Encounters:  03/28/16 167 lb 12.8 oz (76.114 kg)  03/14/16 170 lb (77.111 kg)  02/08/16 171 lb 6.4 oz (77.747 kg)     Past Medical History    Diagnosis Date  . Hypertension   . High cholesterol   . Hyperlipidemia   . Angina   . Migraines     "til ~ 1980"  . Headache(784.0)     "recurring"  . Restless leg syndrome   . Weakness of right side of body     "I've had PT for it; they don't know what it's from".  CORTISONE INJECTION INTO BACK 08/30/12  . Diabetes mellitus     diet controlled/on meds  . Fibromyalgia   . Sciatic nerve pain     "from pinched nerve"  . Depression   . Atrial fibrillation (HCC)     ASPIRIN FOR BLOOD THINNER  . Coronary artery disease   . GERD (gastroesophageal reflux disease)   . H/O hiatal hernia   . Complication of anesthesia     pt states has choking sensation with ET tube   . PONV (postoperative nausea and vomiting)   . Arthritis   . Bulging discs     lumbar   . Sleep apnea     uses CPAP    Past Surgical History  Procedure Laterality Date  . Coronary artery bypass graft  2005    CABG X 2  . Coronary angioplasty with stent placement  06/2011    "1"  . Nasal septum surgery  ~ 1986  . 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 Dilation and curettage of uterus      "more than once"  .  Knee arthroscopy  09/04/2012    Procedure: ARTHROSCOPY KNEE;  Surgeon: Gearlean Alf, MD;  [facility];  Service: Orthopedics;  Laterality: Right;  right knee arthroscopy with medial and lateral meniscus debridement  . Fracture surgery  ~ 2005    nose  . 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 Elbow arthroscopy Left 07/02/2015    Procedure: ARTHROSCOPY LEFT ELBOW WITH DEBRIDEMENT AND REMOVAL LOOSE BODY;  Surgeon: Frederik Pear, MD;  Location: [place];  Service: Orthopedics;  Laterality: Left;  . Carpal tunnel release Left 07/02/2015    Procedure: CARPAL TUNNEL RELEASE;  Surgeon: Frederik Pear,  MD;  Location: [place];  Service: Orthopedics;  Laterality: Left;  . 24 hour ph study N/A 12/29/2015    Procedure: 24 HOUR PH STUDY;  Surgeon: Manus Gunning, MD;  [facility];  Service: Gastroenterology;  Laterality: N/A;  . Esophageal manometry N/A 12/29/2015    Procedure: ESOPHAGEAL MANOMETRY (EM);  Surgeon: Manus Gunning, MD;  [facility];  Service: Gastroenterology;  Laterality: N/A;    Current Outpatient Prescriptions  Medication Sig Dispense Refill  . amoxicillin (AMOXIL) 500 MG capsule Take 500 mg by mouth as directed. Prior to dental procedures.  Reported on 12/23/2015    . aspirin 81 MG tablet Take 1 tablet (81 mg total) by mouth daily.    [name] atorvastatin (LIPITOR) 80 MG tablet Take 80 mg by mouth daily.    . cetirizine (ZYRTEC) 10 MG tablet Take 10 mg by mouth daily. Reported on 12/23/2015    . clonazePAM (KLONOPIN) 1 MG tablet Take 2 mg by mouth at bedtime. Take 2 tablets by mouth daily at bedtime    . DEXILANT 60 MG capsule TAKE 1 CAPSULE (60 MG TOTAL) BY MOUTH DAILY. 60 capsule 0  . gabapentin (NEURONTIN) 300 MG capsule TAKE 1 TO 2 CAPSULES BY MOUTH AT BEDTIME 60 capsule 1  . hydrochlorothiazide (HYDRODIURIL) 25 MG tablet Take 1 tablet (25 mg total) by mouth daily. 90 tablet 1  . meloxicam (MOBIC) 15 MG tablet Take 15 mg by mouth daily. Reported on 12/23/2015    . metformin (FORTAMET) 1000 MG (OSM) 24 hr tablet Take 1,000 mg by mouth 2 (two) times daily with a meal.    . metoprolol succinate (TOPROL-XL) 100 MG 24 hr tablet Take 1 tablet (100 mg total) by mouth daily. Take with or immediately following a meal. 90 tablet 1  . Multiple Vitamins-Minerals (PRESERVISION AREDS PO) Take 1 tablet by mouth 2 (two) times daily. Reported on 12/23/2015    . nitroGLYCERIN (NITROSTAT) 0.4 MG SL tablet Place 0.4 mg under the tongue every 5 (five) minutes as needed for chest pain.    . ONE TOUCH ULTRA TEST test strip CHECK BLOOD SUGAR TWICE A DAY 100  each 5  . ONETOUCH DELICA LANCETS 99991111 MISC USED TO TEST BLOOD SUGAR ONCE DAILY DX: 250.00 100 each 3  . Probiotic Product (PROBIOTIC DAILY PO) Take 1 tablet by mouth 3 (three) times daily before meals.    . venlafaxine XR (EFFEXOR-XR) 37.5 MG 24 hr capsule Take 1 capsule (37.5 mg total) by mouth 2 (two) times daily. 180 capsule 1  . VICTOZA 18 MG/3ML SOPN INJECT 1.2 MG INTO THE SKIN DAILY. 6 pen 2   No current facility-administered medications for this visit.    Allergies:  Allergies  Allergen Reactions  . Hydrocodone-Acetaminophen Other (See Comments)    "Changed personality" "made me very mean"  . Percocet [Oxycodone-Acetaminophen] Other (See Comments)    hallucination  . Sulfa Antibiotics Other (See Comments)    hallucinations    Social History:  The patient  reports that she has never smoked. She has never used smokeless tobacco. She reports that she drinks about 0.6 oz of alcohol per week. She reports that she does not use illicit drugs.   ROS:  Please see the history of present illness.   Denies any fevers, chills, orthopnea, PND, syncope    PHYSICAL EXAM: VS:  BP 126/78 mmHg  Pulse 79  Ht 5\' 3"  (1.6 m)  Wt 167 lb 12.8 oz (76.114 kg)  BMI 29.73 kg/m2 Well nourished, well developed, in no acute distress HEENT: normal Neck: no JVD Cardiac:  normal S1, S2; RRR; no murmurPrior bypass scar well healed Lungs:  clear to auscultation bilaterally, no wheezing, rhonchi or rales Abd: soft, nontender, no hepatomegaly Ext: no edema.  Skin: warm and dry Neuro: no focal abnormalities noted  EKG: EKG was ordered today-03/28/16-sinus rhythm, 79, T-wave inversion in V1, V2, flattening in V3, V4, V5. Nonspecific changes. Personally viewed-prior prior-03/17/15-sinus rhythm, 69, nonspecific T-wave changes-no change from prior.03/23/14-sinus rhythm, 63, nonspecific ST flattening. Overall normal.   ASSESSMENT AND PLAN:  1. Coronary artery disease-status post bypass/ DES diagonal placement.  With her recent episodes of chest discomfort, atypical which are difficult for her to discern from her GERD for which she is being aggressively treated by Dr. Havery Moros, I will check a nuclear stress test, pharmacologic to ensure there are no high-risk signs of ischemia. Her last stress test was in 2013. Continue beta blocker. Off Plavix. Aspirin 81 mg. 2. Obstructive sleep apnea-Dr.Turner, notes reviewed. Daytime fatigue noted. She is concerned about this. 3. TIA-cerebellar. Nausea, vestibular symptoms. Eye Dr. Diagnosis. No evidence of AFIB. Has had a few issues (floating black spots, sick to stomach, occas HA) watch for any signs of orthopnea. 4. S/p knee replacement 07/07/13 - Dr. Wynelle Link. Proved.  5. Hyperlipidemia-excellent lipids. Reviewed labs. Continue with atorvastatin. LDL 48.  6. HTN-mildly elevated. Continue to monitor.We will continue with current medications. Watch for any signs of worsening orthostasis. 7. Diabetes-metformin. Glucose reviewed. She is quite pleased. A1c 8.2 now 6.9 8. Obesity-encourage weight loss once knee pain/physical therapy is improving. Exercise. Be careful with falls. 9. We will see back in 6 months.  Signed, Candee Furbish, MD [facility]  03/28/2016 10:19 AM

## 2016-03-30 ENCOUNTER — Telehealth: Payer: Self-pay | Admitting: Gastroenterology

## 2016-03-30 ENCOUNTER — Other Ambulatory Visit: Payer: Self-pay | Admitting: Family Medicine

## 2016-03-30 NOTE — Telephone Encounter (Signed)
Yes, we discussed this in February with her. These are benign polyps and likely related to PPI use. We should use the lowest dose of PPI needed to control her symptoms however she has had significant reflux on low dose with high Demeester score on PPI. Does she have a follow up with me in clinic. We can book her a routine follow up for reassessment. Thanks

## 2016-03-30 NOTE — Telephone Encounter (Signed)
Dr. Havery Moros, Looks like the poylps were benign and you did not recommend any removal but just want to verify this prior to calling patient.

## 2016-03-30 NOTE — Telephone Encounter (Signed)
Patient notified of recommendations. Scheduled OV on 05/09/16.

## 2016-04-04 ENCOUNTER — Telehealth (HOSPITAL_COMMUNITY): Payer: Self-pay | Admitting: *Deleted

## 2016-04-04 ENCOUNTER — Other Ambulatory Visit: Payer: Self-pay

## 2016-04-04 MED ORDER — LIRAGLUTIDE 18 MG/3ML ~~LOC~~ SOPN: PEN_INJECTOR | SUBCUTANEOUS | Status: DC

## 2016-04-04 NOTE — Telephone Encounter (Signed)
Pt left v/m requesting new Victoza rx sent to [place]. [facility] [address] [facility] and d/c 1.2 mg instructions. Instructions changed at 02/08/16 visit. Refills done and pt notified.

## 2016-04-04 NOTE — Telephone Encounter (Signed)
Patient given detailed instructions per Myocardial Perfusion Study Information Sheet for the test on 04/07/16 at 0915. Patient notified to arrive 15 minutes early and that it is imperative to arrive on time for appointment to keep from having the test rescheduled.  If you need to cancel or reschedule your appointment, please call the office within 24 hours of your appointment. Failure to do so may result in a cancellation of your appointment, and a $50 no show fee. Patient verbalized understanding.Shaquon Gropp, Ranae Palms

## 2016-04-05 ENCOUNTER — Encounter (HOSPITAL_COMMUNITY): Payer: Medicare Other

## 2016-04-07 ENCOUNTER — Ambulatory Visit (HOSPITAL_COMMUNITY): Payer: Medicare Other | Attending: Cardiology

## 2016-04-07 DIAGNOSIS — I10 Essential (primary) hypertension: Secondary | ICD-10-CM | POA: Insufficient documentation

## 2016-04-07 DIAGNOSIS — R079 Chest pain, unspecified: Secondary | ICD-10-CM | POA: Insufficient documentation

## 2016-04-07 DIAGNOSIS — R9439 Abnormal result of other cardiovascular function study: Secondary | ICD-10-CM | POA: Insufficient documentation

## 2016-04-07 DIAGNOSIS — I4891 Unspecified atrial fibrillation: Secondary | ICD-10-CM | POA: Insufficient documentation

## 2016-04-07 LAB — MYOCARDIAL PERFUSION IMAGING
CHL CUP NUCLEAR SDS: 3
CSEPED: 4 min
CSEPEW: 6.4 METS
CSEPPHR: 139 {beats}/min
Exercise duration (sec): 30 s
LV dias vol: 62 mL (ref 46–106)
LVSYSVOL: 22 mL
MPHR: 146 {beats}/min
Percent HR: 95 %
RATE: 0.26
Rest HR: 70 {beats}/min
SRS: 6
SSS: 9
TID: 1.12

## 2016-04-07 MED ORDER — TECHNETIUM TC 99M TETROFOSMIN IV KIT
30.5000 | PACK | Freq: Once | INTRAVENOUS | Status: AC | PRN
  Administered 2016-04-07: 31 via INTRAVENOUS
  Filled 2016-04-07: qty 31

## 2016-04-07 MED ORDER — TECHNETIUM TC 99M TETROFOSMIN IV KIT
10.8000 | PACK | Freq: Once | INTRAVENOUS | Status: AC | PRN
  Administered 2016-04-07: 11 via INTRAVENOUS
  Filled 2016-04-07: qty 11

## 2016-04-11 ENCOUNTER — Other Ambulatory Visit: Payer: Self-pay | Admitting: Family Medicine

## 2016-04-11 ENCOUNTER — Telehealth: Payer: Self-pay | Admitting: Family Medicine

## 2016-04-11 DIAGNOSIS — E119 Type 2 diabetes mellitus without complications: Secondary | ICD-10-CM

## 2016-04-11 NOTE — Telephone Encounter (Signed)
Done

## 2016-04-11 NOTE — Telephone Encounter (Signed)
Pt would like a referral for opthalmologist in [place] (dr Zigmund Daniel) for diabetic eyes.   cb number is [phone]

## 2016-04-12 NOTE — Telephone Encounter (Signed)
Clonazepam called into [facility] [address] 

## 2016-04-12 NOTE — Telephone Encounter (Signed)
Last office visit 02/08/2016.  Ok to refill?

## 2016-04-17 ENCOUNTER — Other Ambulatory Visit: Payer: Self-pay | Admitting: Family Medicine

## 2016-04-17 NOTE — Telephone Encounter (Signed)
Last office visit 02/08/16.  Last refilled 02/14/16 for #60 with 1 refill.  Ok to refill?

## 2016-04-23 ENCOUNTER — Other Ambulatory Visit: Payer: Self-pay | Admitting: Family Medicine

## 2016-04-25 ENCOUNTER — Other Ambulatory Visit: Payer: Self-pay | Admitting: Cardiology

## 2016-05-01 ENCOUNTER — Other Ambulatory Visit: Payer: Self-pay | Admitting: Family Medicine

## 2016-05-02 ENCOUNTER — Encounter (INDEPENDENT_AMBULATORY_CARE_PROVIDER_SITE_OTHER): Payer: Medicare Other | Admitting: Ophthalmology

## 2016-05-02 DIAGNOSIS — H43813 Vitreous degeneration, bilateral: Secondary | ICD-10-CM

## 2016-05-02 DIAGNOSIS — I10 Essential (primary) hypertension: Secondary | ICD-10-CM | POA: Diagnosis not present

## 2016-05-02 DIAGNOSIS — H353132 Nonexudative age-related macular degeneration, bilateral, intermediate dry stage: Secondary | ICD-10-CM

## 2016-05-02 DIAGNOSIS — H2513 Age-related nuclear cataract, bilateral: Secondary | ICD-10-CM

## 2016-05-02 DIAGNOSIS — H35033 Hypertensive retinopathy, bilateral: Secondary | ICD-10-CM | POA: Diagnosis not present

## 2016-05-02 LAB — HM DIABETES EYE EXAM

## 2016-05-03 ENCOUNTER — Ambulatory Visit (INDEPENDENT_AMBULATORY_CARE_PROVIDER_SITE_OTHER): Payer: Medicare Other | Admitting: Podiatry

## 2016-05-03 ENCOUNTER — Encounter: Payer: Self-pay | Admitting: Podiatry

## 2016-05-03 DIAGNOSIS — B351 Tinea unguium: Secondary | ICD-10-CM | POA: Diagnosis not present

## 2016-05-03 DIAGNOSIS — M79676 Pain in unspecified toe(s): Secondary | ICD-10-CM | POA: Diagnosis not present

## 2016-05-03 NOTE — Progress Notes (Signed)
She presents today with chief complaint of painful elongated toenails.  Objective: Vital signs are stable she is alert and oriented 3 pulses are strongly palpable no open lesions or wounds. Her toenails are thick yellow dystrophic onychomycotic and painful palpation.  Assessment: Pain in limb secondary to onychomycosis.  Plan: Debridement of toenails 1 through 5 bilateral is a covered service secondary to pain.

## 2016-05-05 ENCOUNTER — Telehealth: Payer: Self-pay | Admitting: Family Medicine

## 2016-05-05 ENCOUNTER — Other Ambulatory Visit (INDEPENDENT_AMBULATORY_CARE_PROVIDER_SITE_OTHER): Payer: Medicare Other

## 2016-05-05 DIAGNOSIS — E119 Type 2 diabetes mellitus without complications: Secondary | ICD-10-CM

## 2016-05-05 DIAGNOSIS — M8580 Other specified disorders of bone density and structure, unspecified site: Secondary | ICD-10-CM

## 2016-05-05 DIAGNOSIS — E7800 Pure hypercholesterolemia, unspecified: Secondary | ICD-10-CM

## 2016-05-05 LAB — COMPREHENSIVE METABOLIC PANEL
ALT: 27 U/L (ref 0–35)
AST: 26 U/L (ref 0–37)
Albumin: 4.2 g/dL (ref 3.5–5.2)
Alkaline Phosphatase: 68 U/L (ref 39–117)
BILIRUBIN TOTAL: 0.4 mg/dL (ref 0.2–1.2)
BUN: 22 mg/dL (ref 6–23)
CALCIUM: 9.6 mg/dL (ref 8.4–10.5)
CHLORIDE: 100 meq/L (ref 96–112)
CO2: 30 meq/L (ref 19–32)
CREATININE: 0.88 mg/dL (ref 0.40–1.20)
GFR: 66.74 mL/min (ref >60.00)
GLUCOSE: 108 mg/dL — AB (ref 70–99)
Potassium: 3.8 mEq/L (ref 3.5–5.1)
SODIUM: 139 meq/L (ref 135–145)
Total Protein: 6.8 g/dL (ref 6.0–8.3)

## 2016-05-05 LAB — VITAMIN D 25 HYDROXY (VIT D DEFICIENCY, FRACTURES): VITD: 61.25 ng/mL (ref 30.00–100.00)

## 2016-05-05 LAB — HEMOGLOBIN A1C: Hgb A1c MFr Bld: 6.5 % (ref 4.6–6.5)

## 2016-05-05 NOTE — Telephone Encounter (Signed)
-----   Message from Marchia Bond sent at 04/25/2016  1:37 PM EDT ----- Regarding: Dm f/u labs Fri 6/23, need orders. Thanks! :-) Please order future dm f/u labs for pt's upcoming lab appt. Thanks Aniceto Boss

## 2016-05-09 ENCOUNTER — Encounter: Payer: Self-pay | Admitting: Family Medicine

## 2016-05-09 ENCOUNTER — Ambulatory Visit (INDEPENDENT_AMBULATORY_CARE_PROVIDER_SITE_OTHER): Payer: Medicare Other | Admitting: Gastroenterology

## 2016-05-09 ENCOUNTER — Encounter: Payer: Self-pay | Admitting: Gastroenterology

## 2016-05-09 VITALS — BP 106/62 | HR 80 | Ht 62.6 in | Wt 163.2 lb

## 2016-05-09 DIAGNOSIS — K317 Polyp of stomach and duodenum: Secondary | ICD-10-CM | POA: Diagnosis not present

## 2016-05-09 DIAGNOSIS — K219 Gastro-esophageal reflux disease without esophagitis: Secondary | ICD-10-CM

## 2016-05-09 DIAGNOSIS — K229 Disease of esophagus, unspecified: Secondary | ICD-10-CM | POA: Diagnosis not present

## 2016-05-09 DIAGNOSIS — K224 Dyskinesia of esophagus: Secondary | ICD-10-CM

## 2016-05-09 MED ORDER — DEXLANSOPRAZOLE 30 MG PO CPDR: 30.0000 mg | DELAYED_RELEASE_CAPSULE | Freq: Every day | ORAL | Status: DC

## 2016-05-09 MED ORDER — SUCRALFATE 1 GM/10ML PO SUSP: 1.0000 g | Freq: Four times a day (QID) | ORAL | Status: DC

## 2016-05-09 NOTE — Patient Instructions (Signed)
We have sent in your prescription of liquid Carafate to your pharmacy Decrease dexilant to 30 mg daily Follow up as needed

## 2016-05-09 NOTE — Progress Notes (Signed)
HPI :  75 y/o female here for follow up for GERD and Jackhammer esophagus. She has a PMH as outlined above.   Since her last visit she had an EGD for ongoing reflux despite PPI therapy done on 12/23/15. She had too numerous to count fundic gland polyps, several > 1cm in size. Several were removed as a representative sample including the largest polyps, and all consistent with benign fundic gland polyps. This is likely the result of her PPI use.  She subsequently had a 24 HR pH testing on twice daily Protonix which showed a Demeester score of 40 with majority of reflux episodes being acidic, with symptom index of 75%. 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   We placed her on Dexilant 60mg  and then added peppermint althoids to use PRN since the last visit.   She reports on Dexilant she has been doing fairly well. Her main symptoms has historically been regurgitation. She reports every 2 weeks she will have some breakthrough which causes her symptoms. Outside of this episode she does well without significant reflux. No heartburn. She reports feeling much better on the Dexilant, 60-70% improved compared to protonix. She has had occasional dysphagia to pills, not much with foods. She eats slowly. She has some mild discomfort with swallows at times. She has tried peppermint althoids PRN which she thinks makes her reflux worse but has not helped her swallowing. She is not using it much.  She has previously failed zantac and H2 blockers in regards to her reflux symptoms. She has been taking gaviscon PRN which can help for breakthrough. She thinks she has some diarrhea when she takes this. She has osteopenia for which she takes vitamin D. Of note she has had a prior negative gastric emptying study.   Last colonoscopy 10/2014 - 2 polyps removed, benign, told to f/u in 5 yrs given FH of CRC.     Past  Medical History  Diagnosis Date  . Hypertension   . High cholesterol   . Hyperlipidemia   . Angina   . Migraines     "til ~ 1980"  . Headache(784.0)     "recurring"  . Restless leg syndrome   . Weakness of right side of body     "I've had PT for it; they don't know what it's from".  CORTISONE INJECTION INTO BACK 08/30/12  . Diabetes mellitus     diet controlled/on meds  . Fibromyalgia   . Sciatic nerve pain     "from pinched nerve"  . Depression   . Atrial fibrillation (HCC)     ASPIRIN FOR BLOOD THINNER  . Coronary artery disease   . GERD (gastroesophageal reflux disease)   . H/O hiatal hernia   . Complication of anesthesia     pt states has choking sensation with ET tube   . PONV (postoperative nausea and vomiting)   . Arthritis   . Bulging discs     lumbar   . Sleep apnea     uses CPAP  . Jackhammer esophagus      Past Surgical History  Procedure Laterality Date  . Coronary artery bypass graft  2005    CABG X 2  . Coronary angioplasty with stent placement  06/2011    "1"  . Nasal septum surgery  ~ 1986  . Mouth surgery  2004    "bone replacement; had cadavear bones put in;  face was collapsing"  . Dilation and curettage of uterus      "more than once"  . Knee arthroscopy  09/04/2012    Procedure: ARTHROSCOPY KNEE;  Surgeon: Gearlean Alf, MD;  Location: [facility];  Service: Orthopedics;  Laterality: Right;  right knee arthroscopy with medial and lateral meniscus debridement  . Fracture surgery  ~ 2005    nose  . 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 Elbow arthroscopy Left 07/02/2015    Procedure: ARTHROSCOPY LEFT ELBOW WITH DEBRIDEMENT AND REMOVAL LOOSE BODY;  Surgeon: Frederik Pear, MD;  Location: [facility];  Service: Orthopedics;  Laterality: Left;  . Carpal tunnel release Left 07/02/2015    Procedure:  CARPAL TUNNEL RELEASE;  Surgeon: Frederik Pear, MD;  Location: [facility];  Service: Orthopedics;  Laterality: Left;  . 24 hour ph study N/A 12/29/2015    Procedure: 24 HOUR PH STUDY;  Surgeon: Manus Gunning, MD;  Location: [facility];  Service: Gastroenterology;  Laterality: N/A;  . Esophageal manometry N/A 12/29/2015    Procedure: ESOPHAGEAL MANOMETRY (EM);  Surgeon: Manus Gunning, MD;  Location: [facility];  Service: Gastroenterology;  Laterality: N/A;   Family History  Problem Relation Age of Onset  . Cancer Mother 45    breast cancer  . Breast cancer Mother   . Heart disease Father 60    sudden onset due to CAD  . Cancer Sister 54    colon  . Hypertension Sister   . Dementia Sister   . Diabetes Sister   . Colon cancer Sister   . GER disease Daughter   . Hypertension Daughter   . Heart disease Brother   . Hypertension Brother   . Heart attack Neg Hx   . Stroke Neg Hx    Social History  Substance Use Topics  . Smoking status: Never Smoker   . Smokeless tobacco: Never Used  . Alcohol Use: 0.6 oz/week    1 Glasses of wine per week     Comment: "occasionally drink wine"   Current Outpatient Prescriptions  Medication Sig Dispense Refill  . aspirin 81 MG tablet Take 1 tablet (81 mg total) by mouth daily.    [name] atorvastatin (LIPITOR) 80 MG tablet TAKE 1 TABLET EVERY DAY 90 tablet 3  . cetirizine (ZYRTEC) 10 MG tablet Take 10 mg by mouth daily. Reported on 12/23/2015    . clonazePAM (KLONOPIN) 1 MG tablet TAKE TWO TABLETS BY MOUTH AT BEDTIME 60 tablet 0  . gabapentin (NEURONTIN) 300 MG capsule TAKE 1 TO 2 CAPSULES BY MOUTH AT BEDTIME 60 capsule 1  . hydrochlorothiazide (HYDRODIURIL) 25 MG tablet Take 1 tablet (25 mg total) by mouth daily. 90 tablet 1  . Liraglutide (VICTOZA) 18 MG/3ML SOPN INJECT 1.8 MG INTO THE SKIN DAILY. 6 pen 1  . meloxicam (MOBIC) 15 MG tablet Take 15 mg by mouth daily. Reported on 12/23/2015    . metformin (FORTAMET) 1000 MG  (OSM) 24 hr tablet Take 1,000 mg by mouth 2 (two) times daily with a meal.    . metoprolol succinate (TOPROL-XL) 100 MG 24 hr tablet Take 1 tablet (100 mg total) by mouth daily. Take with or immediately following a meal. 90 tablet 1  . Multiple Vitamins-Minerals (PRESERVISION AREDS PO) Take 1 tablet by mouth 2 (two) times daily. Reported on 12/23/2015    .  NOVOFINE 32G X 6 MM MISC USE TO INJECT VICTOZA ONE TIME DAILY. DX: E11.9 100 each 3  . ONE TOUCH ULTRA TEST test strip CHECK BLOOD SUGAR TWICE A DAY 100 each 5  . ONETOUCH DELICA LANCETS 99991111 MISC Use to check blood sugar once daily.  Dx? E11.9 100 each 3  . Probiotic Product (PROBIOTIC DAILY PO) Take 1 tablet by mouth 3 (three) times daily before meals.    . venlafaxine XR (EFFEXOR-XR) 37.5 MG 24 hr capsule TAKE 1 CAPSULE (37.5 MG TOTAL) BY MOUTH 2 (TWO) TIMES DAILY. 180 capsule 1  . amoxicillin (AMOXIL) 500 MG capsule Take 500 mg by mouth as directed. Reported on 05/09/2016    . Dexlansoprazole (DEXILANT) 30 MG capsule Take 1 capsule (30 mg total) by mouth daily. 30 capsule 3  . nitroGLYCERIN (NITROSTAT) 0.4 MG SL tablet Place 0.4 mg under the tongue every 5 (five) minutes as needed for chest pain. Reported on 05/09/2016    . sucralfate (CARAFATE) 1 GM/10ML suspension Take 10 mLs (1 g total) by mouth every 6 (six) hours. 420 mL 3   No current facility-administered medications for this visit.   Allergies  Allergen Reactions  . Hydrocodone-Acetaminophen Other (See Comments)    "Changed personality" "made me very mean"  . Percocet [Oxycodone-Acetaminophen] Other (See Comments)    hallucination  . Sulfa Antibiotics Other (See Comments)    hallucinations     Review of Systems: All systems reviewed and negative except where noted in HPI.   Lab Results  Component Value Date   WBC 7.8 09/24/2015   HGB 12.1 09/24/2015   HCT 37.3 09/24/2015   MCV 89.3 09/24/2015   PLT 308.0 09/24/2015   Lab Results  Component Value Date   CREATININE 0.88  05/05/2016   BUN 22 05/05/2016   NA 139 05/05/2016   K 3.8 05/05/2016   CL 100 05/05/2016   CO2 30 05/05/2016    Lab Results  Component Value Date   ALT 27 05/05/2016   AST 26 05/05/2016   ALKPHOS 68 05/05/2016   BILITOT 0.4 05/05/2016     Physical Exam: BP 106/62 mmHg  Pulse 80  Ht 5' 2.6" (1.59 m)  Wt 163 lb 4 oz (74.05 kg)  BMI 29.29 kg/m2 Constitutional: Pleasant,well-developed, female in no acute distress. HEENT: Normocephalic and atraumatic. Conjunctivae are normal. No scleral icterus. Neck supple.  Cardiovascular: Normal rate, regular rhythm.  Pulmonary/chest: Effort normal and breath sounds normal. No wheezing, rales or rhonchi. Abdominal: Soft, nondistended, nontender. Bowel sounds active throughout. There are no masses palpable. No hepatomegaly. Extremities: no edema Lymphadenopathy: No cervical adenopathy noted. Neurological: Alert and oriented to person place and time. Skin: Skin is warm and dry. No rashes noted. Psychiatric: Normal mood and affect. Behavior is normal.   ASSESSMENT AND PLAN: 75 y/o female with medical problems as outlined above, here to address the following:  GERD - severe breakthrough acid reflux despite twice daily protonix based on pH study. Now doing much better on dexilant 60mg  daily. I discussed risks / benefits of long term PPI use. She does have a history of osteopenia and she does have large fundic gland polyps likely attributed to PPIs. Recommend the lowest dose of PPI needed to control her symptoms at this point. She has failed H2 blockers in the past. Will try decreasing her Dexilant to 30mg  daily and see how she does, if symptoms flare and bother her she can always increase back to 60mg . I will also provide some liquid  carafate to use PRN and see if this helps. Unfortunately she is not a good surgical candidate if she fails PPIs, given presence of Jackhammer esophagus. She can follow up PRN for this issue.   Jackhammer esophagus -  minimal symptoms, occasional dysphagia / odynophagia but not significant. Althoids not helping so will stop. If symptoms worsen can consider switch from toprol to diltiazem, as I suspect this would benefit her symptoms, however she is happy on toprol, works well for her BP and HR control, and does not wish to switch at this time  Fundic gland polyps - benign, due to chronic high dose PPI use. These are thought to be low risk for development of malignancy. There are too many to remove endoscopically and will very likely recur unless PPI is ultimately stopped. Given she is a poor surgical candidate for reflux, and failed alternative measures, she wishes to continue PPI.  I would monitor for now, if she has any change in symptoms we can consider repeating EGD to assess size of polyps  Severn Cellar, MD [facility] Pager [phone]  CC: Jinny Sanders, MD

## 2016-05-12 ENCOUNTER — Encounter: Payer: Self-pay | Admitting: Family Medicine

## 2016-05-12 ENCOUNTER — Ambulatory Visit (INDEPENDENT_AMBULATORY_CARE_PROVIDER_SITE_OTHER): Payer: Medicare Other | Admitting: Family Medicine

## 2016-05-12 VITALS — BP 110/58 | HR 73 | Temp 97.4°F | Ht 62.25 in | Wt 164.0 lb

## 2016-05-12 DIAGNOSIS — R4781 Slurred speech: Secondary | ICD-10-CM

## 2016-05-12 DIAGNOSIS — E119 Type 2 diabetes mellitus without complications: Secondary | ICD-10-CM | POA: Diagnosis not present

## 2016-05-12 DIAGNOSIS — R29810 Facial weakness: Secondary | ICD-10-CM

## 2016-05-12 DIAGNOSIS — Z8673 Personal history of transient ischemic attack (TIA), and cerebral infarction without residual deficits: Secondary | ICD-10-CM

## 2016-05-12 DIAGNOSIS — E7800 Pure hypercholesterolemia, unspecified: Secondary | ICD-10-CM | POA: Diagnosis not present

## 2016-05-12 LAB — HM DIABETES FOOT EXAM

## 2016-05-12 NOTE — Assessment & Plan Note (Signed)
Since November.. Pt with history of TIA and parosysmal afib.  ON baby aspirin.   Concerning given findings for  stroke in November. Doubt bells palsy.  Will have pt increase to 325 mg daily of aspirin.  Will eval with MRI brain and angiogram. Pt will call cardiologist and let him know of symptoms.   Will continue to manage risk factors of DM and lipids to decrease risk of recurrence.

## 2016-05-12 NOTE — Progress Notes (Signed)
75 year old female presents for DM follow up.  She is feeling better overall since treatment for GERD, jackhammer esophagus and fundic gland polyps by Dr. Enis Gash.  She has history of TIA last year.. Visual symptoms.  Has history of parosysmal afib followed by Dr. Marlou Porch. Also since last November she has noted droop on right eyelid and right droop of mouth. She feel eye droop comes and goes... Worse if she is tired. She also feels her speech has not been as clear, but she has dry mouth that contributes. She is taking a baby aspirin.  No pain in face.  Diabetes: Improved control on metformin, victoza 1.8 mcg daily Lab Results  Component Value Date   HGBA1C 6.5 05/05/2016  Using medications without difficulties:none Hypoglycemic episodes:None Hyperglycemic episodes:one 200 , not sure why Feet problems:None, thickened toenails, seeing podiatrist Blood Sugars averaging: FBS 108-160, 2 hours meals: not checking eye exam within last year: yes Exercise: general activity Diet: good  Wt Readings from Last 3 Encounters:  05/12/16 164 lb (74.39 kg)  05/09/16 163 lb 4 oz (74.05 kg)  03/28/16 167 lb 12.8 oz (76.114 kg)   Vit D in nml range  Review of Systems  Constitutional: Negative for fever and fatigue.  HENT: Negative for ear pain.  Eyes: Negative for pain.  Respiratory: Negative for chest tightness and shortness of breath.  Cardiovascular: Negative for chest pain, palpitations and leg swelling.  Gastrointestinal: improved Genitourinary: Negative for dysuria.  Objective:   Physical Exam  Constitutional: She is oriented to person, place, and time. Vital signs are normal. She appears well-developed and well-nourished. She is cooperative. Non-toxic appearance. She does not appear ill. No distress.  HENT:  Head: Normocephalic.  Right Ear: Hearing, tympanic membrane, external ear and ear canal normal. Tympanic membrane is not erythematous, not retracted and not bulging.   Left Ear: Hearing, tympanic membrane, external ear and ear canal normal. Tympanic membrane is not erythematous, not retracted and not bulging.  Nose: No mucosal edema or rhinorrhea. Right sinus exhibits no maxillary sinus tenderness and no frontal sinus tenderness. Left sinus exhibits no maxillary sinus tenderness and no frontal sinus tenderness.  Mouth/Throat: Uvula is midline, oropharynx is clear and moist and mucous membranes are normal.  Eyes: Conjunctivae, EOM and lids are normal. Pupils are equal, round, and reactive to light. Lids are everted and swept, no foreign bodies found.  Neck: Trachea normal and normal range of motion. Neck supple. Carotid bruit is not present. No mass and no thyromegaly present.  Cardiovascular: Normal rate, regular rhythm, S1 normal, S2 normal, normal heart sounds, intact distal pulses and normal pulses. Exam reveals no gallop and no friction rub.  No murmur heard.  Pulmonary/Chest: Effort normal and breath sounds normal. Not tachypneic. No respiratory distress. She has no decreased breath sounds. She has no wheezes. She has no rhonchi. She has no rales.  Abdominal: Soft. Normal appearance and bowel sounds are normal. There is no tenderness.  Neurological: She is alert and oriented to person, place, and time. She has normal strength and normal reflexes. No cranial nerve deficit or sensory deficit. She exhibits normal muscle tone. She displays a negative Romberg sign. Coordination and gait normal. GCS eye subscore is 4. GCS verbal subscore is 5. GCS motor subscore is 6.  Nml cerebellar exam  No papilledema  Skin: Skin is warm, dry and intact. No rash noted.  Psychiatric: She has a normal mood and affect. Her speech is normal and behavior is normal. Judgment and  thought content normal. Her mood appears not anxious. Cognition and memory are normal. Cognition and memory are not impaired. She does not exhibit a depressed mood. She exhibits normal recent memory  and normal remote memory.   Diabetic foot exam: Normal inspection No skin breakdown No calluses  Normal DP pulses Normal sensation to light touch and monofilament Nails thickened

## 2016-05-12 NOTE — Assessment & Plan Note (Signed)
Improved control and now at goal on current meds.

## 2016-05-12 NOTE — Assessment & Plan Note (Signed)
LDL almost at goal < 70 at last check with history of TIA.

## 2016-05-12 NOTE — Patient Instructions (Addendum)
Keep working on healthy eating, regular exercise and weight loss. Increase aspirin to 325 mg daily enteric coated.  Stop at front desk to set up MRI brain.

## 2016-05-12 NOTE — Progress Notes (Signed)
Pre visit review using our clinic review tool, if applicable. No additional management support is needed unless otherwise documented below in the visit note. 

## 2016-05-14 ENCOUNTER — Other Ambulatory Visit: Payer: Medicare Other

## 2016-05-18 ENCOUNTER — Telehealth: Payer: Self-pay

## 2016-05-18 ENCOUNTER — Other Ambulatory Visit: Payer: Self-pay | Admitting: Family Medicine

## 2016-05-18 DIAGNOSIS — Z8673 Personal history of transient ischemic attack (TIA), and cerebral infarction without residual deficits: Secondary | ICD-10-CM

## 2016-05-18 DIAGNOSIS — R29810 Facial weakness: Secondary | ICD-10-CM

## 2016-05-18 DIAGNOSIS — R4781 Slurred speech: Secondary | ICD-10-CM

## 2016-05-18 NOTE — Telephone Encounter (Signed)
Last office visit 05/12/2016.   Last refilled 04/12/2016 for #60 with no refills.  Ok to refill?

## 2016-05-18 NOTE — Telephone Encounter (Signed)
Ivin Booty with [facility] left v/m wanting to confirm test needed. MRA was ordered that will show the blood vessels but does pt need an MRI as well. Ivin Booty request cb.

## 2016-05-18 NOTE — Telephone Encounter (Signed)
Ivin Booty from [facility] looked over your order and said if you are trying to rule out a stroke then you will need to add an MRI Brain with and without contrast along with the MR MRA order that you already have. Patient is scheduled for 05/20/16 so please add this order ASAP if you need this as well. [facility] phone number is [phone].

## 2016-05-19 ENCOUNTER — Other Ambulatory Visit: Payer: Self-pay | Admitting: Family Medicine

## 2016-05-19 NOTE — Telephone Encounter (Signed)
Clonazepam called into [facility] [address] 

## 2016-05-19 NOTE — Telephone Encounter (Signed)
Order entered

## 2016-05-21 ENCOUNTER — Inpatient Hospital Stay: Admission: RE | Admit: 2016-05-21 | Payer: Medicare Other | Source: Ambulatory Visit

## 2016-05-26 ENCOUNTER — Telehealth: Payer: Self-pay | Admitting: Family Medicine

## 2016-05-26 ENCOUNTER — Ambulatory Visit
Admission: RE | Admit: 2016-05-26 | Discharge: 2016-05-26 | Disposition: A | Discharge status: Home / Self Care | Payer: Medicare Other | Source: Ambulatory Visit | Attending: Family Medicine | Admitting: Family Medicine

## 2016-05-26 DIAGNOSIS — Z8673 Personal history of transient ischemic attack (TIA), and cerebral infarction without residual deficits: Secondary | ICD-10-CM

## 2016-05-26 DIAGNOSIS — R29810 Facial weakness: Secondary | ICD-10-CM

## 2016-05-26 DIAGNOSIS — R4781 Slurred speech: Secondary | ICD-10-CM

## 2016-05-26 DIAGNOSIS — W19XXXA Unspecified fall, initial encounter: Secondary | ICD-10-CM

## 2016-05-26 MED ORDER — GADOBENATE DIMEGLUMINE 529 MG/ML IV SOLN
15.0000 mL | Freq: Once | INTRAVENOUS | Status: AC | PRN
  Administered 2016-05-26: 15 mL via INTRAVENOUS

## 2016-05-26 NOTE — Telephone Encounter (Signed)
Discussed results of MRI with pt and placed referral to neuro.

## 2016-05-30 ENCOUNTER — Other Ambulatory Visit: Payer: Medicare Other

## 2016-06-06 ENCOUNTER — Telehealth: Payer: Self-pay | Admitting: Family Medicine

## 2016-06-06 NOTE — Telephone Encounter (Signed)
-----   Message from [facility] sent at 06/05/2016 11:42 AM EDT ----- Regarding: Carotid Patient is scheduled for a 2 year follow up carotid exam on 06/14/16.  Do you want the exam repeated since the patient just had a head and brain MRA that was negative on 05/26/16?  Thank you.

## 2016-06-06 NOTE — Telephone Encounter (Signed)
No.. Okay to cancel.

## 2016-06-14 ENCOUNTER — Telehealth: Payer: Self-pay | Admitting: Cardiology

## 2016-06-14 ENCOUNTER — Encounter (HOSPITAL_COMMUNITY): Payer: Medicare Other

## 2016-06-14 NOTE — Telephone Encounter (Signed)
New message     Pt wants to let you know that her general practitioner is sending her to a neurologist in [place] due to having a MRI. Please call.

## 2016-06-14 NOTE — Telephone Encounter (Signed)
Returned call. Left message that information is being forwarded to Dr. Marlou Porch for review and to call back if she had any other questions or concerns.

## 2016-06-15 NOTE — Telephone Encounter (Signed)
Thanks. Reviewed MRI. She is going to see neuro for facial droop. No acute findings.  Candee Furbish, MD

## 2016-06-16 ENCOUNTER — Other Ambulatory Visit: Payer: Self-pay | Admitting: Family Medicine

## 2016-06-16 NOTE — Telephone Encounter (Signed)
Last office visit 05/12/2016.  Last refilled 04/18/2016 for #60 with 1 refill. Ok to refill?

## 2016-06-19 ENCOUNTER — Encounter: Payer: Self-pay | Admitting: Neurology

## 2016-06-19 ENCOUNTER — Ambulatory Visit (INDEPENDENT_AMBULATORY_CARE_PROVIDER_SITE_OTHER): Payer: Medicare Other | Admitting: Neurology

## 2016-06-19 VITALS — BP 130/76 | HR 76 | Resp 16 | Ht 62.5 in | Wt 162.0 lb

## 2016-06-19 DIAGNOSIS — R4781 Slurred speech: Secondary | ICD-10-CM

## 2016-06-19 DIAGNOSIS — Z8673 Personal history of transient ischemic attack (TIA), and cerebral infarction without residual deficits: Secondary | ICD-10-CM | POA: Diagnosis not present

## 2016-06-19 DIAGNOSIS — R930 Abnormal findings on diagnostic imaging of skull and head, not elsewhere classified: Secondary | ICD-10-CM

## 2016-06-19 DIAGNOSIS — R9089 Other abnormal findings on diagnostic imaging of central nervous system: Secondary | ICD-10-CM

## 2016-06-19 DIAGNOSIS — R531 Weakness: Secondary | ICD-10-CM

## 2016-06-19 DIAGNOSIS — M6289 Other specified disorders of muscle: Secondary | ICD-10-CM | POA: Diagnosis not present

## 2016-06-19 NOTE — Progress Notes (Signed)
Subjective:    Patient ID: Morgan [name] is a 75 y.o. female.  HPI     Star Age, MD, PhD [facility] [address] [place], [place] [address]  Dear Dr. Diona Browner,   I saw your patient, Morgan [name], upon your kind request in my neurologic clinic today for initial consultation of her facial droop. The patient is accompanied by her husband today. 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 fib, arthritis, restless leg syndrome, sciatica, sleep apnea, obesity and prior history of TIA in 2015 per chart review, as well as overweight state, who reports intermittent weakness on the right side with right facial droop, occasional slurring of speech off an on for the past 4-5 months, lasting for minutes at a time, occurring nearly every week, but no actual pattern or triggers. She also feels off-balance at times and has fallen. She has a walker and a cane available. The workers from when she had her knee replacement surgery a few years ago on the right. She still has problems with her right knee and it is more swollen. She needs prophylactic antibiotics when she has a dental procedure.  She had a fall years ago, fell down a flight of stairs some 9 years ago. No other head trauma reported. She does not drink enough water, she admits, 1-2 glasses per day, 2 glasses of half/half ice tea per day, one cup of Coffee in AM.  She uses a CPAP at night for her OSA, managed by Dr. Radford Pax.   I reviewed your office note from 05/12/2016. She reported a several month history, since November 2016 of right facial droop which comes and goes, worse when  tired. She was on a baby aspirin and she was advised to increase this to a full size aspirin daily. You ordered a brain MRI and MRA. She had this on 05/26/2016 and I reviewed the results:   IMPRESSION: No acute brain finding. Mild chronic small-vessel ischemic change of the left cerebellum and cerebral hemispheric white matter. Atrophy, gliosis and encephalomalacia of the left temporal tip probably related to old head trauma.   Intracranial MR angiography does not show any significant acquired pathology of the large or medium size vessels.    Her Past Medical History Is Significant For: Past Medical History:  Diagnosis Date  . Angina   . Arthritis   . Atrial fibrillation (HCC)    ASPIRIN FOR BLOOD THINNER  . Bulging discs    lumbar   . Complication of anesthesia    pt states has choking sensation with ET tube   . Coronary artery disease   . Depression   . Diabetes mellitus    diet controlled/on meds  . Fibromyalgia   . GERD (gastroesophageal reflux disease)   . H/O hiatal hernia   . Headache(784.0)    "recurring"  . High cholesterol   . Hyperlipidemia   . Hypertension   . 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 Migraines    "til ~ 1980"  . PONV (postoperative nausea and vomiting)   . Restless leg syndrome   . Sciatic nerve pain    "from pinched nerve"  . Sleep apnea    uses CPAP  . Weakness  of right side of body    "I've had PT for it; they don't know what it's from".  CORTISONE INJECTION INTO BACK 08/30/12    Her Past Surgical History Is Significant For: Past Surgical History:  Procedure Laterality Date  . [place] STUDY N/A 12/29/2015   Procedure: [place] STUDY;  Surgeon: Manus Gunning, MD;  Location: [facility];  Service: Gastroenterology;  Laterality: N/A;  . CARPAL TUNNEL RELEASE Left 07/02/2015   Procedure: CARPAL TUNNEL RELEASE;  Surgeon: Frederik Pear, MD;  Location: [place];  Service: Orthopedics;  Laterality: Left;  . CORONARY  ANGIOPLASTY WITH STENT PLACEMENT  06/2011   "1"  . CORONARY ARTERY BYPASS GRAFT  2005   CABG X 2  . DILATION AND CURETTAGE OF UTERUS     "more than once"  . ELBOW ARTHROSCOPY Left 07/02/2015   Procedure: ARTHROSCOPY LEFT ELBOW WITH DEBRIDEMENT AND REMOVAL LOOSE BODY;  Surgeon: Frederik Pear, MD;  Location: [place];  Service: Orthopedics;  Laterality: Left;  . ESOPHAGEAL MANOMETRY N/A 12/29/2015   Procedure: ESOPHAGEAL MANOMETRY (EM);  Surgeon: Manus Gunning, MD;  Location: [facility];  Service: Gastroenterology;  Laterality: N/A;  . FRACTURE SURGERY  ~ 2005   nose  . KNEE ARTHROSCOPY  09/04/2012   Procedure: ARTHROSCOPY KNEE;  Surgeon: Gearlean Alf, MD;  Location: [facility];  Service: Orthopedics;  Laterality: Right;  right knee arthroscopy with medial and lateral meniscus debridement  . MOUTH SURGERY  2004   "bone replacement; had cadavear bones put in; face was collapsing"  . NASAL SEPTUM SURGERY  ~ 1986  . TOTAL KNEE ARTHROPLASTY Right 07/07/2013   Procedure: RIGHT TOTAL KNEE ARTHROPLASTY;  Surgeon: Gearlean Alf, MD;  Location: [facility];  Service: Orthopedics;  Laterality: Right;    Her Family History Is Significant For: Family History  Problem Relation Age of Onset  . Cancer Mother 60    breast cancer  . Breast cancer Mother   . Heart disease Father 28    sudden onset due to CAD  . Cancer Sister 60    colon  . Hypertension Sister   . Dementia Sister   . Diabetes Sister   . Colon cancer Sister   . GER disease Daughter   . Hypertension Daughter   . Heart disease Brother   . Hypertension Brother   . Heart attack Neg Hx   . Stroke Neg Hx     Her Social History Is Significant For: Social History   Social History  . Marital status: Married    Spouse name: N/A  . Number of children: 2  . Years of education: college   Occupational History  . Retired     Social History Main Topics  . Smoking status: Never Smoker  . Smokeless tobacco: Never  Used  . Alcohol use 0.6 oz/week    1 Glasses of wine per week     Comment: "occasionally drink wine"  . Drug use: No  . Sexual activity: Yes   Other Topics Concern  . None   Social History Narrative   Drinks 1 cup of coffee a day     Her Allergies Are:  Allergies  Allergen Reactions  . Hydrocodone-Acetaminophen Other (See Comments)    "Changed personality" "made me very mean"  . Percocet [Oxycodone-Acetaminophen] Other (See Comments)    hallucination  . Sulfa Antibiotics Other (See Comments)    hallucinations  :   Her Current Medications Are:  Outpatient Encounter Prescriptions as of 06/19/2016  Medication Sig  . amoxicillin (AMOXIL) 500 MG capsule Take 500 mg by mouth as directed. Reported on 05/09/2016  . aspirin 81 MG tablet Take 1 tablet (81 mg total) by mouth daily.  [name] atorvastatin (LIPITOR) 80 MG tablet TAKE 1 TABLET EVERY DAY  . cetirizine (ZYRTEC) 10 MG tablet Take 10 mg by mouth daily. Reported on 12/23/2015  . clonazePAM (KLONOPIN) 1 MG tablet TAKE 2 TABLETS AT BEDTIME  . Dexlansoprazole (DEXILANT) 30 MG capsule Take 1 capsule (30 mg total) by mouth daily.  [name] gabapentin (NEURONTIN) 300 MG capsule TAKE 1 TO 2 CAPSULES BY MOUTH AT BEDTIME  . hydrochlorothiazide (HYDRODIURIL) 25 MG tablet Take 1 tablet (25 mg total) by mouth daily.  . Liraglutide (VICTOZA) 18 MG/3ML SOPN INJECT 1.8 MG INTO THE SKIN DAILY.  . meloxicam (MOBIC) 15 MG tablet Take 15 mg by mouth daily. Reported on 12/23/2015  . metformin (FORTAMET) 1000 MG (OSM) 24 hr tablet Take 1,000 mg by mouth 2 (two) times daily with a meal.  . metoprolol succinate (TOPROL-XL) 100 MG 24 hr tablet Take 1 tablet (100 mg total) by mouth daily. Take with or immediately following a meal.  . Multiple Vitamins-Minerals (PRESERVISION AREDS 2 PO) Take by mouth.  . Multiple Vitamins-Minerals (PRESERVISION AREDS PO) Take 1 tablet by mouth 2 (two) times daily. Reported on 12/23/2015  . nitroGLYCERIN (NITROSTAT) 0.4 MG SL tablet Place 0.4  mg under the tongue every 5 (five) minutes as needed for chest pain. Reported on 05/09/2016  . NOVOFINE 32G X 6 MM MISC USE TO INJECT VICTOZA ONE TIME DAILY. DX: E11.9  . ONE TOUCH ULTRA TEST test strip CHECK BLOOD SUGAR TWICE A DAY  . ONETOUCH DELICA LANCETS 99991111 MISC Use to check blood sugar once daily.  Dx? E11.9  . Probiotic Product (PROBIOTIC DAILY PO) Take 1 tablet by mouth 3 (three) times daily before meals.  . sucralfate (CARAFATE) 1 GM/10ML suspension Take 10 mLs (1 g total) by mouth every 6 (six) hours.  [name] venlafaxine XR (EFFEXOR-XR) 37.5 MG 24 hr capsule TAKE 1 CAPSULE (37.5 MG TOTAL) BY MOUTH 2 (TWO) TIMES DAILY.  . [DISCONTINUED] metformin (FORTAMET) 1000 MG (OSM) 24 hr tablet TAKE 1 TABLET BY MOUTH EVERY DAY AS DIRECTED . IN COMBO WITH 500G ER TABLETS   No facility-administered encounter medications on file as of 06/19/2016.   :   Review of Systems:  Out of a complete 14 point review of systems, all are reviewed and negative with the exception of these symptoms as listed below:  Review of Systems  Neurological:       Patient reports that she has had weakness for several years.  Patient reports that about 3-4 months ago she started having slurred speech and R facial droop. She will stop mid sentence and forget what she is saying.  Per patient, her ophthalmologist told her that she has had 2 TIA.     Objective:  Neurologic Exam  Physical Exam Physical Examination:   Vitals:   06/19/16 1108  BP: 130/76  Pulse: 76  Resp: 16    General Examination: The patient is a very pleasant 75 y.o. female in no acute distress. She appears well-developed and well-nourished and well groomed.   HEENT: Normocephalic, atraumatic, pupils are equal, round and reactive to light and accommodation. Funduscopic exam is Difficult, she has evidence of bilateral cataracts. She is visually impaired on the right. This is not new. Extraocular tracking is good without  limitation to gaze excursion or  nystagmus noted. Normal smooth pursuit is noted. Hearing is grossly intact. Face is symmetric with normal facial animation and normal facial sensation. Speech is clear with no dysarthria noted. There is no hypophonia. There is no lip, neck/head, jaw or voice tremor. Neck is supple with full range of passive and active motion. There are no carotid bruits on auscultation. Oropharynx exam reveals: moderate mouth dryness, adequate dental hygiene and mild airway crowding. Mallampati is class II. Tongue protrudes centrally and palate elevates symmetrically.  Chest: Clear to auscultation without wheezing, rhonchi or crackles noted.  Heart: S1+S2+0, regular and normal without murmurs, rubs or gallops noted.   Abdomen: Soft, non-tender and non-distended with normal bowel sounds appreciated on auscultation.  Extremities: There is no pitting edema in the distal lower extremities bilaterally. Pedal pulses are intact.  Skin: Warm and dry without trophic changes noted. There are no varicose veins.  Musculoskeletal: exam reveals no obvious joint deformities, tenderness or joint swelling or erythema, with the exception of right knee swelling, overall unremarkable scar from right total knee replacement surgery.   Neurologically:  Mental status: The patient is awake, alert and oriented in all 4 spheres. Her immediate and remote memory, attention, language skills and fund of knowledge are appropriate. There is no evidence of aphasia, agnosia, apraxia or anomia. Speech is clear with normal prosody and enunciation. Thought process is linear. Mood is normal and affect is normal.  Cranial nerves II - XII are as described above under HEENT exam. In addition: shoulder shrug is normal with equal shoulder height noted. Motor exam: Normal bulk, strength and tone is noted. There is no drift, tremor or rebound. Romberg is negative, except for minimal swaying. Reflexes are 1+ throughout. Fine motor skills and coordination: intact  with normal finger taps, normal hand movements, normal rapid alternating patting, normal foot taps and normal foot agility.  Cerebellar testing: No dysmetria or intention tremor on finger to nose testing. Heel to shin is unremarkable bilaterally. There is no truncal or gait ataxia.  Sensory exam: intact to light touch, pinprick, vibration, temperature sense in the upper and lower extremities, with the exception of decreased pinprick and temperature sense around the scar of the right knee.  Gait, station and balance: She stands easily. No veering to one side is noted. No leaning to one side is noted. Posture is age-appropriate and stance is narrow based. Gait shows normal stride length and normal pace. She turns a little insecurely, tandem walk is difficult for her.   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 fib, arthritis, restless leg syndrome, sciatica, sleep apnea, obesity and prior history of TIA in 2015 per chart review, as well as overweight state, who reports intermittent weakness on the right side. This has been ongoing for the past 4-5 months, occurs nearly every week, at times seems to be daily, lasting for a few minutes at a time. Presentation is unusual for TIA because it is repetitive, MRI overall reassuring with old scarring noted and changes with mild white matter disease, and old basal ganglia lacunar. Nothing acute or subacute. She was reassured. In addition, she has a nonfocal neurological exam. She has vascular risk  factors. She is encouraged to continue with all her medications including her aspirin and cholesterol medication and use  her CPAP faithfully for sleep apnea. We talked about risk factor modification. She is encouraged strongly to increase her water intake and limit her caffeine intake. I encouraged her to be more active physically, for gait safety, she is advised to use her walker which she has available. She is also reminded that clonazepam can cause balance problems and increase the risk of fall especially at night if she were to have to get up at night. I would like to proceed with an EEG because of the stereotypical and repetitive nature of her symptoms as well as a carotid Doppler test. We talked about her recent test results today. Findings were explained. I would like to see her back in a few months for a recheck, sooner as needed. We will call her with her EEG and carotid Doppler test results in the interim. I answered all her questions today and the patient and her husband were in agreement.  Thank you very much for allowing me to participate in the care of this nice patient. If I can be of any further assistance to you please do not hesitate to call me at [phone].  Sincerely,   Star Age, MD, PhD

## 2016-06-19 NOTE — Patient Instructions (Signed)
We will do an EEG (brainwave test), which we will schedule. We will call you with the results.   We will schedule a carotid doppler ultrasound. We will call you with the results.  Continue your medications.   Use your walker for gait safety.   Drink more water, and try to exercise in the form of walking, you may use your walker for safety reasons, as you have fallen.   Exam looks good today.

## 2016-06-27 ENCOUNTER — Telehealth: Payer: Self-pay | Admitting: Family Medicine

## 2016-06-27 ENCOUNTER — Telehealth: Payer: Self-pay | Admitting: Neurology

## 2016-06-27 NOTE — Telephone Encounter (Signed)
Patient is ready to be scheduled for her Doppler NO PA needed. Thanks Hinton Dyer .

## 2016-06-27 NOTE — Telephone Encounter (Signed)
Electronic refill request. Last Filled:    60 tablet 0 05/19/2016  Last office visit:   05/12/16  Please advise.

## 2016-06-28 ENCOUNTER — Encounter: Payer: Self-pay | Admitting: Family Medicine

## 2016-06-29 ENCOUNTER — Other Ambulatory Visit: Payer: Self-pay | Admitting: Family Medicine

## 2016-06-29 DIAGNOSIS — Z1231 Encounter for screening mammogram for malignant neoplasm of breast: Secondary | ICD-10-CM

## 2016-06-29 MED ORDER — METFORMIN HCL ER (OSM) 1000 MG PO TB24: 1000.0000 mg | ORAL_TABLET | Freq: Two times a day (BID) | ORAL | 11 refills | Status: DC

## 2016-07-01 ENCOUNTER — Other Ambulatory Visit: Payer: Self-pay | Admitting: Family Medicine

## 2016-07-03 NOTE — Telephone Encounter (Signed)
Clonazepam called into [facility] [address] 

## 2016-07-05 ENCOUNTER — Ambulatory Visit (INDEPENDENT_AMBULATORY_CARE_PROVIDER_SITE_OTHER): Payer: Medicare Other

## 2016-07-05 DIAGNOSIS — R4781 Slurred speech: Secondary | ICD-10-CM

## 2016-07-05 DIAGNOSIS — Z8673 Personal history of transient ischemic attack (TIA), and cerebral infarction without residual deficits: Secondary | ICD-10-CM

## 2016-07-05 DIAGNOSIS — R9089 Other abnormal findings on diagnostic imaging of central nervous system: Secondary | ICD-10-CM

## 2016-07-05 DIAGNOSIS — R531 Weakness: Secondary | ICD-10-CM

## 2016-07-06 ENCOUNTER — Other Ambulatory Visit: Payer: Self-pay | Admitting: Family Medicine

## 2016-07-06 ENCOUNTER — Ambulatory Visit
Admission: RE | Admit: 2016-07-06 | Discharge: 2016-07-06 | Disposition: A | Discharge status: Home / Self Care | Payer: Medicare Other | Source: Ambulatory Visit | Attending: Family Medicine | Admitting: Family Medicine

## 2016-07-06 DIAGNOSIS — Z1231 Encounter for screening mammogram for malignant neoplasm of breast: Secondary | ICD-10-CM | POA: Diagnosis present

## 2016-07-12 ENCOUNTER — Ambulatory Visit (INDEPENDENT_AMBULATORY_CARE_PROVIDER_SITE_OTHER): Payer: Medicare Other | Admitting: Neurology

## 2016-07-12 ENCOUNTER — Telehealth: Payer: Self-pay

## 2016-07-12 DIAGNOSIS — R531 Weakness: Secondary | ICD-10-CM

## 2016-07-12 DIAGNOSIS — R9089 Other abnormal findings on diagnostic imaging of central nervous system: Secondary | ICD-10-CM

## 2016-07-12 DIAGNOSIS — R4781 Slurred speech: Secondary | ICD-10-CM

## 2016-07-12 DIAGNOSIS — R4701 Aphasia: Secondary | ICD-10-CM | POA: Diagnosis not present

## 2016-07-12 DIAGNOSIS — Z8673 Personal history of transient ischemic attack (TIA), and cerebral infarction without residual deficits: Secondary | ICD-10-CM

## 2016-07-12 NOTE — Telephone Encounter (Signed)
-----   Message from Star Age, MD sent at 07/12/2016 12:02 PM EDT ----- Please call and advise the patient that the EEG or brain wave test we performed was reported as normal in the awake state. We checked for abnormal electrical discharges in the brain waves and the report suggested normal findings. No further action is required on this test at this time. Please remind patient to keep any upcoming appointments or tests and to call us with any interim questions, concerns, problems or updates. Thanks,  Star Age, MD, PhD

## 2016-07-12 NOTE — Procedures (Signed)
    History:  Morgan [name] is a 75 year old patient with a history of diabetes, fibromyalgia, and heart disease. The patient has had intermittent episodes of right facial droop and slurred speech over the last 4 or 5 months. The patient has also has some episodes of imbalance and falls. The patient is being evaluated for this issue.  This is a routine EEG. No skull defects are noted. Medications include aspirin, Lipitor, Zyrtec, clonazepam, Dexilant, gabapentin, hydrochlorothiazide, Victoza, Mobic, metformin, metoprolol, multivitamins, nitroglycerin, sucralfate, and Effexor.   EEG classification: Normal awake  Description of the recording: The background rhythms of this recording consists of a fairly well modulated medium amplitude alpha rhythm of 9 Hz that is reactive to eye opening and closure. As the record progresses, the patient appears to remain in the waking state throughout the recording. Photic stimulation was performed, resulting in a bilateral and symmetric photic driving response. Hyperventilation was also performed, resulting in a minimal buildup of the background rhythm activities without significant slowing seen. At no time during the recording does there appear to be evidence of spike or spike wave discharges or evidence of focal slowing. EKG monitor shows no evidence of cardiac rhythm abnormalities with a heart rate of 66.  Impression: This is a normal EEG recording in the waking state. No evidence of ictal or interictal discharges are seen.

## 2016-07-12 NOTE — Telephone Encounter (Signed)
I spoke to patient and she is aware of results and recommendations.  

## 2016-07-12 NOTE — Progress Notes (Signed)
Please call and advise the patient that the EEG or brain wave test we performed was reported as normal in the awake state. We checked for abnormal electrical discharges in the brain waves and the report suggested normal findings. No further action is required on this test at this time. Please remind patient to keep any upcoming appointments or tests and to call us with any interim questions, concerns, problems or updates. Thanks,  Yaiza Palazzola, MD, PhD  

## 2016-07-28 ENCOUNTER — Other Ambulatory Visit: Payer: Self-pay | Admitting: Family Medicine

## 2016-07-31 ENCOUNTER — Ambulatory Visit (INDEPENDENT_AMBULATORY_CARE_PROVIDER_SITE_OTHER): Payer: Medicare Other | Admitting: Podiatry

## 2016-07-31 ENCOUNTER — Encounter: Payer: Self-pay | Admitting: Podiatry

## 2016-07-31 DIAGNOSIS — M79676 Pain in unspecified toe(s): Secondary | ICD-10-CM | POA: Diagnosis not present

## 2016-07-31 DIAGNOSIS — B351 Tinea unguium: Secondary | ICD-10-CM

## 2016-07-31 NOTE — Progress Notes (Signed)
She presents today with a chief complaint of painful elongated toenails 1 through 5 bilateral.  Objective: Vital signs are stable alert and oriented 3. Pulses are palpable. Her nails are thick yellow dystrophic mycotic and painful to palpation. There are no open lesions or wounds noted.  Assessment: Pain in limb secondary to onychomycosis.  Plan: Debridement of toenails 1 through 5 bilateral.

## 2016-08-04 ENCOUNTER — Telehealth: Payer: Self-pay | Admitting: Cardiology

## 2016-08-04 NOTE — Telephone Encounter (Signed)
New Message  Pt call requesting to speak with RN about getting a sooner appt schedule than available. Pt states she had a stroke and needs to be seen by cardiology. Please call back to discuss

## 2016-08-04 NOTE — Telephone Encounter (Signed)
Scheduled appt for pt to see Dr Marlou Porch as requested.

## 2016-08-08 ENCOUNTER — Other Ambulatory Visit: Payer: Self-pay | Admitting: Family Medicine

## 2016-08-08 ENCOUNTER — Encounter: Payer: Self-pay | Admitting: Family Medicine

## 2016-08-08 NOTE — Telephone Encounter (Signed)
Clonazepam called into [facility] [address] 

## 2016-08-08 NOTE — Telephone Encounter (Signed)
Last office visit 05/12/2016.  Last refilled 06/29/2016 for #60 with no refills.  Ok to refill?

## 2016-08-11 ENCOUNTER — Ambulatory Visit: Payer: Medicare Other | Admitting: Podiatry

## 2016-08-15 ENCOUNTER — Other Ambulatory Visit: Payer: Self-pay | Admitting: *Deleted

## 2016-08-15 MED ORDER — METFORMIN HCL ER (OSM) 1000 MG PO TB24: 1000.0000 mg | ORAL_TABLET | Freq: Two times a day (BID) | ORAL | 3 refills | Status: DC

## 2016-08-15 NOTE — Telephone Encounter (Signed)
Received [facility] from [facility] requesting 90 day supply.

## 2016-08-16 ENCOUNTER — Other Ambulatory Visit: Payer: Self-pay | Admitting: Family Medicine

## 2016-08-16 NOTE — Telephone Encounter (Signed)
Last office visit 05/12/2016.  Last refilled 06/16/2016 for #60 with 1 refill. Ok to refill?

## 2016-08-18 ENCOUNTER — Encounter: Payer: Self-pay | Admitting: Podiatry

## 2016-08-18 ENCOUNTER — Other Ambulatory Visit: Payer: Self-pay | Admitting: Gastroenterology

## 2016-08-18 ENCOUNTER — Ambulatory Visit (INDEPENDENT_AMBULATORY_CARE_PROVIDER_SITE_OTHER): Payer: Medicare Other | Admitting: Podiatry

## 2016-08-18 DIAGNOSIS — L600 Ingrowing nail: Secondary | ICD-10-CM | POA: Diagnosis not present

## 2016-08-18 DIAGNOSIS — L03039 Cellulitis of unspecified toe: Secondary | ICD-10-CM

## 2016-08-18 DIAGNOSIS — M79676 Pain in unspecified toe(s): Secondary | ICD-10-CM

## 2016-08-18 NOTE — Patient Instructions (Signed)

## 2016-08-19 NOTE — Progress Notes (Signed)
Subjective: Patient presents today for evaluation of pain in her toe(s). Patient is concerned for possible ingrown nail. Patient states that the pain has been present for a few weeks now. Patient presents today for further treatment and evaluation. Patient states that she did have an ingrown nail procedure performed approximately 1 year ago however she has noticed regrowth with pain and tenderness.  Objective:  General: Well developed, nourished, in no acute distress, alert and oriented x3   Dermatology: Skin is warm, dry and supple bilateral. Left great toe lateral border appears to be erythematous with evidence of an ingrowing nail. Purulent drainage noted with intruding nail into the respective nail fold. Pain on palpation noted to the border of the nail fold. The remaining nails appear unremarkable at this time. There are no open sores, lesions.  Vascular: Dorsalis Pedis artery and Posterior Tibial artery pedal pulses palpable. No lower extremity edema noted.   Neruologic: Grossly intact via light touch bilateral.  Musculoskeletal: Muscular strength within normal limits in all groups bilateral. Normal range of motion noted to all pedal and ankle joints.   Assesement: #1 ingrown left great toe lateral border #2 nail spicule regrowth left great toe lateral border #3 pain in left great toe   Plan of Care:  1. Patient evaluated.  2. Discussed treatment alternatives and plan of care. Explained nail avulsion procedure and post procedure course to patient. 3. Patient opted for permanent partial nail avulsion.  4. Prior to procedure, local anesthesia infiltration utilized using 3 ml of a 50:50 mixture of 2% plain lidocaine and 0.5% plain marcaine in a normal hallux block fashion and a betadine prep performed.  5. Partial permanent nail avulsion with chemical matrixectomy performed using [id number] applications of phenol followed by alcohol flush.  6. Light dressing applied. 7. Return to clinic  in 2 weeks.   Dr. Edrick Kins [facility]

## 2016-08-24 ENCOUNTER — Other Ambulatory Visit: Payer: Self-pay | Admitting: Family Medicine

## 2016-09-01 ENCOUNTER — Ambulatory Visit: Payer: Medicare Other | Admitting: Podiatry

## 2016-09-04 ENCOUNTER — Other Ambulatory Visit: Payer: Self-pay | Admitting: Gastroenterology

## 2016-09-05 ENCOUNTER — Ambulatory Visit (INDEPENDENT_AMBULATORY_CARE_PROVIDER_SITE_OTHER): Payer: Medicare Other | Admitting: Podiatry

## 2016-09-05 DIAGNOSIS — S91109D Unspecified open wound of unspecified toe(s) without damage to nail, subsequent encounter: Secondary | ICD-10-CM

## 2016-09-05 DIAGNOSIS — L89891 Pressure ulcer of other site, stage 1: Secondary | ICD-10-CM | POA: Diagnosis not present

## 2016-09-05 DIAGNOSIS — M79676 Pain in unspecified toe(s): Secondary | ICD-10-CM

## 2016-09-05 DIAGNOSIS — L03039 Cellulitis of unspecified toe: Secondary | ICD-10-CM

## 2016-09-05 NOTE — Progress Notes (Signed)
Subjective: Patient presents today 2 weeks post ingrown nail permanent nail avulsion procedure. Patient states that the toe and nail fold is feeling much better.  Objective: Skin is warm, dry and supple. Nail and respective nail fold appears to be healing appropriately. Open wound to the associated nail fold with a granular wound base and moderate amount of fibrotic tissue. Minimal drainage noted. Mild erythema around the periungual region likely due to phenol chemical matricectomy.  Assessment: #1 postop permanent partial nail avulsion #2 open wound periungual nail fold of respective digit.   Plan of care: #1 patient was evaluated  #2 debridement of open wound was performed to the periungual border of the respective toe using a currette. Antibiotic ointment and Band-Aid was applied. #3 patient is to return to clinic on a PRN  basis.   

## 2016-09-18 ENCOUNTER — Other Ambulatory Visit: Payer: Self-pay | Admitting: Family Medicine

## 2016-09-18 NOTE — Telephone Encounter (Signed)
Last office visit 05/12/2016.  Last refilled 08/08/16 for #60 with no refills.  Ok to refill?

## 2016-09-19 NOTE — Telephone Encounter (Signed)
Clonazepam called into [facility] [address] 

## 2016-09-21 ENCOUNTER — Other Ambulatory Visit: Payer: Self-pay | Admitting: *Deleted

## 2016-09-21 NOTE — Telephone Encounter (Signed)
Last office visit 05/12/2016.  Last refilled 08/17/2016 for #60 with 1 refill.  Ok to refill?  Pharmacy is requesting 90 day supply.  Ok to change to #180?

## 2016-09-22 ENCOUNTER — Encounter: Payer: Self-pay | Admitting: Cardiology

## 2016-09-22 ENCOUNTER — Ambulatory Visit (INDEPENDENT_AMBULATORY_CARE_PROVIDER_SITE_OTHER): Payer: Medicare Other | Admitting: Cardiology

## 2016-09-22 ENCOUNTER — Other Ambulatory Visit: Payer: Self-pay

## 2016-09-22 VITALS — BP 110/60 | HR 80 | Ht 62.5 in | Wt 155.8 lb

## 2016-09-22 DIAGNOSIS — E669 Obesity, unspecified: Secondary | ICD-10-CM

## 2016-09-22 DIAGNOSIS — D229 Melanocytic nevi, unspecified: Secondary | ICD-10-CM | POA: Diagnosis not present

## 2016-09-22 DIAGNOSIS — G4733 Obstructive sleep apnea (adult) (pediatric): Secondary | ICD-10-CM | POA: Diagnosis not present

## 2016-09-22 DIAGNOSIS — I10 Essential (primary) hypertension: Secondary | ICD-10-CM

## 2016-09-22 HISTORY — DX: Melanocytic nevi, unspecified: D22.9

## 2016-09-22 MED ORDER — GABAPENTIN 300 MG PO CAPS: ORAL_CAPSULE | ORAL | 1 refills | Status: DC

## 2016-09-22 NOTE — Patient Instructions (Signed)
Medication Instructions:  Your physician recommends that you continue on your current medications as directed. Please refer to the Current Medication list given to you today.   Labwork: None  Testing/Procedures: None  Follow-Up: You have been referred to DERMATOLOGY for evaluation of atypical mole on right neck.  Your physician wants you to follow-up in: 1 year with Dr. Radford Pax. You will receive a reminder letter in the mail two months in advance. If you don't receive a letter, please call our office to schedule the follow-up appointment.   Any Other Special Instructions Will Be Listed Below (If Applicable).     If you need a refill on your cardiac medications before your next appointment, please call your pharmacy.

## 2016-09-22 NOTE — Progress Notes (Signed)
Dermatology referral placed per Dr. Radford Pax for evaluation of atypical mole.

## 2016-09-22 NOTE — Progress Notes (Signed)
Cardiology Office Note    Date:  09/22/2016   ID:  Morgan [name], DOB 1941/08/13, MRN [id number]  PCP:  Eliezer Lofts, MD  Cardiologist:  Fransico Him, MD   Chief Complaint  Patient presents with  . Sleep Apnea  . Hypertension    History of Present Illness:  Morgan [name] is a 75 y.o. female with a history of OSA, HTN and obesity who presents today for followup. She uses a full face mask which she tolerates.  She did get a nasal mask but has not tried that yet.  She feels the pressure is adequate. She sleeps well at night, feels rested in the am and has no daytime sleepiness.  She has been trying to sleep on her side.  She has had a lot of problems with mouth dryness.  She does not really get any aerobic exercise.    Past Medical History:  Diagnosis Date  . Angina   . Arthritis   . Atrial fibrillation (HCC)    ASPIRIN FOR BLOOD THINNER  . Atypical mole 09/22/2016  . Bulging discs    lumbar   . Complication of anesthesia    pt states has choking sensation with ET tube   . Coronary artery disease   . Depression   . Diabetes mellitus    diet controlled/on meds  . Fibromyalgia   . GERD (gastroesophageal reflux disease)   . H/O hiatal hernia   . Headache(784.0)    "recurring"  . High cholesterol   . Hyperlipidemia   . Hypertension   . 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 Migraines    "til ~ 1980"  . PONV (postoperative nausea and vomiting)   . Restless leg syndrome   . Sciatic nerve pain    "from pinched nerve"  . Sleep apnea    uses CPAP  . Weakness of right side of body    "I've had PT for it; they don't know what it's from".  CORTISONE INJECTION INTO BACK 08/30/12    Past Surgical History:  Procedure Laterality Date  . [place] STUDY N/A 12/29/2015   Procedure: [place] STUDY;  Surgeon: Manus Gunning, MD;  Location: [facility];  Service: Gastroenterology;  Laterality: N/A;  . CARPAL TUNNEL RELEASE Left 07/02/2015   Procedure: CARPAL TUNNEL RELEASE;   Surgeon: Frederik Pear, MD;  Location: [place];  Service: Orthopedics;  Laterality: Left;  . CORONARY ANGIOPLASTY WITH STENT PLACEMENT  06/2011   "1"  . CORONARY ARTERY BYPASS GRAFT  2005   CABG X 2  . DILATION AND CURETTAGE OF UTERUS     "more than once"  . ELBOW ARTHROSCOPY Left 07/02/2015   Procedure: ARTHROSCOPY LEFT ELBOW WITH DEBRIDEMENT AND REMOVAL LOOSE BODY;  Surgeon: Frederik Pear, MD;  Location: [place];  Service: Orthopedics;  Laterality: Left;  . ESOPHAGEAL MANOMETRY N/A 12/29/2015   Procedure: ESOPHAGEAL MANOMETRY (EM);  Surgeon: Manus Gunning, MD;  [facility];  Service: Gastroenterology;  Laterality: N/A;  . FRACTURE SURGERY  ~ 2005   nose  . KNEE ARTHROSCOPY  09/04/2012   Procedure: ARTHROSCOPY KNEE;  Surgeon: Gearlean Alf, MD;  [facility]: [facility];  Service: Orthopedics;  Laterality: Right;  right knee arthroscopy with medial and lateral meniscus debridement  . MOUTH SURGERY  2004   "bone replacement; had cadavear bones put in; face was collapsing"  . NASAL SEPTUM SURGERY  ~ 1986  . TOTAL KNEE ARTHROPLASTY Right 07/07/2013   Procedure: RIGHT  TOTAL KNEE ARTHROPLASTY;  Surgeon: Gearlean Alf, MD;  [facility]: [facility];  Service: Orthopedics;  Laterality: Right;    Current Medications: Outpatient Medications Prior to Visit  Medication Sig Dispense Refill  . aspirin 81 MG tablet Take 1 tablet (81 mg total) by mouth daily.    [name] atorvastatin (LIPITOR) 80 MG tablet TAKE 1 TABLET EVERY DAY 90 tablet 3  . CARAFATE 1 GM/10ML suspension TAKE 10 MLS (1 G TOTAL) BY MOUTH EVERY 6 (SIX) HOURS. 420 mL 1  . cetirizine (ZYRTEC) 10 MG tablet Take 10 mg by mouth daily. Reported on 12/23/2015    . clonazePAM (KLONOPIN) 1 MG tablet TAKE 2 TABLETS BY MOUTH AT BEDTIME 60 tablet 0  . DEXILANT 30 MG capsule TAKE 1 CAPSULE (30 MG TOTAL) BY MOUTH DAILY. 30 capsule 3  . gabapentin (NEURONTIN) 300 MG capsule TAKE 1 TO 2 CAPSULES BY MOUTH AT BEDTIME 60  capsule 1  . hydrochlorothiazide (HYDRODIURIL) 25 MG tablet Take 1 tablet (25 mg total) by mouth daily. 90 tablet 1  . meloxicam (MOBIC) 15 MG tablet Take 15 mg by mouth daily. Reported on 12/23/2015    . metformin (FORTAMET) 1000 MG (OSM) 24 hr tablet Take 1 tablet (1,000 mg total) by mouth 2 (two) times daily with a meal. 180 tablet 3  . metoprolol succinate (TOPROL-XL) 100 MG 24 hr tablet TAKE 1 TABLET (100 MG TOTAL) BY MOUTH DAILY. TAKE WITH OR IMMEDIATELY FOLLOWING A MEAL. 90 tablet 1  . Multiple Vitamins-Minerals (PRESERVISION AREDS PO) Take 1 tablet by mouth 2 (two) times daily. Reported on 12/23/2015    . nitroGLYCERIN (NITROSTAT) 0.4 MG SL tablet Place 0.4 mg under the tongue every 5 (five) minutes as needed for chest pain. Reported on 05/09/2016    . NOVOFINE 32G X 6 MM MISC USE TO INJECT VICTOZA ONE TIME DAILY. DX: E11.9 100 each 3  . ONE TOUCH ULTRA TEST test strip CHECK BLOOD SUGAR TWICE A DAY 100 each 5  . ONETOUCH DELICA LANCETS 99991111 MISC Use to check blood sugar once daily.  Dx? E11.9 100 each 3  . Probiotic Product (PROBIOTIC DAILY PO) Take 1 tablet by mouth 3 (three) times daily before meals.    . venlafaxine XR (EFFEXOR-XR) 37.5 MG 24 hr capsule TAKE 1 CAPSULE (37.5 MG TOTAL) BY MOUTH 2 (TWO) TIMES DAILY. 180 capsule 1  . VICTOZA 18 MG/3ML SOPN INJECT 1.8 MG INTO THE SKIN DAILY. 18 pen 1  . Multiple Vitamins-Minerals (PRESERVISION AREDS 2 PO) Take by mouth.     No facility-administered medications prior to visit.      Allergies:   Hydrocodone-acetaminophen; Percocet [oxycodone-acetaminophen]; and Sulfa antibiotics   Social History   Social History  . Marital status: Married    Spouse name: N/A  . Number of children: 2  . Years of education: college   Occupational History  . Retired     Social History Main Topics  . Smoking status: Never Smoker  . Smokeless tobacco: Never Used  . Alcohol use 0.6 oz/week    1 Glasses of wine per week     Comment: "occasionally drink  wine"  . Drug use: No  . Sexual activity: Yes   Other Topics Concern  . None   Social History Narrative   Drinks 1 cup of coffee a day      Family History:  The patient's family history includes Breast cancer in her mother; Cancer (age of onset: 31) in her mother and sister; Colon cancer  in her sister; Dementia in her sister; Diabetes in her sister; GER disease in her daughter; Heart disease in her brother; Heart disease (age of onset: 63) in her father; Hypertension in her brother, daughter, and sister.   ROS:   Please see the history of present illness.    ROS All other systems reviewed and are negative.  No flowsheet data found.     PHYSICAL EXAM:   VS:  BP 110/60   Pulse 80   Ht 5' 2.5" (1.588 m)   Wt 155 lb 12.8 oz (70.7 kg)   SpO2 98%   BMI 28.04 kg/m    GEN: Well nourished, well developed, in no acute distress  HEENT: normal  Neck: no JVD, carotid bruits, or masses Cardiac: RRR; no murmurs, rubs, or gallops,no edema.  Intact distal pulses bilaterally.  Respiratory:  clear to auscultation bilaterally, normal work of breathing GI: soft, nontender, nondistended, + BS MS: no deformity or atrophy  Skin: warm and dry, no rash, large irregular mole on right neck Neuro:  Alert and Oriented x 3, Strength and sensation are intact Psych: euthymic mood, full affect  Wt Readings from Last 3 Encounters:  09/22/16 155 lb 12.8 oz (70.7 kg)  06/19/16 162 lb (73.5 kg)  05/12/16 164 lb (74.4 kg)      Studies/Labs Reviewed:   EKG:  EKG is not ordered today.    Recent Labs: 09/24/2015: Hemoglobin 12.1; Platelets 308.0 05/05/2016: ALT 27; BUN 22; Creatinine, Ser 0.88; Potassium 3.8; Sodium 139   Lipid Panel    Component Value Date/Time   CHOL 142 02/03/2016 0928   TRIG 91.0 02/03/2016 0928   HDL 46.70 02/03/2016 0928   CHOLHDL 3 02/03/2016 0928   VLDL 18.2 02/03/2016 0928   LDLCALC 77 02/03/2016 0928    Additional studies/ records that were reviewed today include:    CPAP downloda    ASSESSMENT:    1. Obstructive sleep apnea   2. Essential hypertension   3. Obesity (BMI 30-39.9)   4. Atypical mole      PLAN:  In order of problems listed above:  OSA - the patient is tolerating PAP therapy well without any problems.  The patient has been using and benefiting from CPAP use and will continue to benefit from therapy.   I will get a download from DME. HTN - BP controlled on current meds.  Continue BB and diuretic.  3.   Obesity - I have encouraged her to get into a routine exercise program and cut back on carbs and portions.  4.   Atypical appearing mole on neck - I will refer her to Dermatology for evaluation.    Medication Adjustments/Labs and Tests Ordered: Current medicines are reviewed at length with the patient today.  Concerns regarding medicines are outlined above.  Medication changes, Labs and Tests ordered today are listed in the Patient Instructions below.  There are no Patient Instructions on file for this visit.   Signed, Fransico Him, MD  09/22/2016 10:35 AM    [address], [place], [place]  [address] Phone: [phone]; Fax: [phone]

## 2016-09-23 ENCOUNTER — Other Ambulatory Visit: Payer: Self-pay | Admitting: Family Medicine

## 2016-09-24 ENCOUNTER — Encounter: Payer: Self-pay | Admitting: Cardiology

## 2016-09-26 ENCOUNTER — Ambulatory Visit: Payer: Medicare Other | Admitting: Cardiology

## 2016-09-29 ENCOUNTER — Ambulatory Visit: Payer: Medicare Other | Admitting: Cardiology

## 2016-09-29 ENCOUNTER — Other Ambulatory Visit: Payer: Self-pay | Admitting: *Deleted

## 2016-09-29 DIAGNOSIS — G4733 Obstructive sleep apnea (adult) (pediatric): Secondary | ICD-10-CM

## 2016-10-09 ENCOUNTER — Other Ambulatory Visit: Payer: Self-pay | Admitting: Gastroenterology

## 2016-10-12 ENCOUNTER — Ambulatory Visit (INDEPENDENT_AMBULATORY_CARE_PROVIDER_SITE_OTHER): Payer: Medicare Other | Admitting: Cardiology

## 2016-10-12 ENCOUNTER — Encounter: Payer: Self-pay | Admitting: Cardiology

## 2016-10-12 VITALS — BP 142/70 | HR 72 | Ht 62.5 in | Wt 155.4 lb

## 2016-10-12 DIAGNOSIS — G4733 Obstructive sleep apnea (adult) (pediatric): Secondary | ICD-10-CM

## 2016-10-12 DIAGNOSIS — G458 Other transient cerebral ischemic attacks and related syndromes: Secondary | ICD-10-CM

## 2016-10-12 DIAGNOSIS — I2510 Atherosclerotic heart disease of native coronary artery without angina pectoris: Secondary | ICD-10-CM

## 2016-10-12 DIAGNOSIS — I2583 Coronary atherosclerosis due to lipid rich plaque: Secondary | ICD-10-CM | POA: Diagnosis not present

## 2016-10-12 NOTE — Patient Instructions (Signed)

## 2016-10-12 NOTE — Progress Notes (Signed)
[place] [place]. [address], [address] [place], [place]  [address] Phone: [phone] Fax:  [phone]  Date:  10/12/2016   ID:  Morgan [name], DOB 1940-12-30, MRN [id number]  PCP:  Eliezer Lofts, MD   History of Present Illness: Morgan [name] is a 75 y.o. female with coronary artery disease status post bypass surgery in 2006, diagonal stent, DM, cerebellar TIA/Stroke 2015 here for followup.   Had episode of angioedema (testing for allergies has been done - negative except for cockroach) in April 2014, I stopped her losartan because of the small chance of cross reactivity/angioedema.  Cough has improved. [facility]).   In review of her coronary disease, in April of 2012 she had cardiac catheterization with drug-eluting stent the first diagonal branch that had previously been determined to small for bypass in 2006. Had prior bypass [facility].   Hyperlipidemia-prior LDL 58 on 8/14. Excellent. Blood pressure also under good control.   Glucose mildly elevated. Hemoglobin A1C was 8.4.  Knee replacement.   Had cerebellar TIA 2015. Nausea, flashes in front of eye. Working on diabetes. Metformin, diarrhea. Hemoglobin A1c 8.2. Excellent LDL 59. Mild bilateral carotid plaque. MRI 2017 confirmed cerebellar. Right side weak, broke elbow with fall.   Sleep apnea - feels real tired, sleeps at the drop of a hat. Had CP, hard to tell if GERD. GI work up. Dr. Havery Moros. On coctail to decrease H pylori.   [name], husband prostate cancer is back. Took 7 grandkids to [employer].   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 She does not require any further therapy as long as it does not come back. She has had some gait ataxia from her left cerebellar stroke/TIA in 2015. MRI once again showed this lesion. Neurology increased her aspirin to 325.  Wt Readings from Last 3 Encounters:  10/12/16 155 lb 6.4 oz (70.5 kg)  09/22/16 155 lb 12.8 oz (70.7 kg)  06/19/16 162 lb (73.5 kg)     Past Medical  History:  Diagnosis Date  . Angina   . Arthritis   . Atrial fibrillation (HCC)    ASPIRIN FOR BLOOD THINNER  . Atypical mole 09/22/2016  . Bulging discs    lumbar   . Complication of anesthesia    pt states has choking sensation with ET tube   . Coronary artery disease   . Depression   . Diabetes mellitus    diet controlled/on meds  . Fibromyalgia   . GERD (gastroesophageal reflux disease)   . H/O hiatal hernia   . Headache(784.0)    "recurring"  . High cholesterol   . Hyperlipidemia   . Hypertension   . 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 Migraines    "til ~ 1980"  . PONV (postoperative nausea and vomiting)   . Restless leg syndrome   . Sciatic nerve pain    "from pinched nerve"  . Sleep apnea    uses CPAP  . Weakness of right side of body    "I've had PT for it; they don't know what it's from".  CORTISONE INJECTION INTO BACK 08/30/12    Past Surgical History:  Procedure Laterality Date  . [place] STUDY N/A 12/29/2015   Procedure: [place] STUDY;  Surgeon: Manus Gunning, MD;  [facility];  Service: Gastroenterology;  Laterality: N/A;  . CARPAL TUNNEL RELEASE Left 07/02/2015   Procedure: CARPAL TUNNEL RELEASE;  Surgeon: Frederik Pear, MD;  Location: [place];  Service: Orthopedics;  Laterality: Left;  . CORONARY ANGIOPLASTY WITH STENT PLACEMENT  06/2011   "1"  . CORONARY ARTERY BYPASS GRAFT  2005   CABG X 2  . DILATION AND CURETTAGE OF UTERUS     "more than once"  . ELBOW ARTHROSCOPY Left 07/02/2015   Procedure: ARTHROSCOPY LEFT ELBOW WITH DEBRIDEMENT AND REMOVAL LOOSE BODY;  Surgeon: Frederik Pear, MD;  Location: [place];  Service: Orthopedics;  Laterality: Left;  . ESOPHAGEAL MANOMETRY N/A 12/29/2015   Procedure: ESOPHAGEAL MANOMETRY (EM);  Surgeon: Manus Gunning, MD;  [facility];  Service: Gastroenterology;  Laterality: N/A;  . FRACTURE SURGERY  ~ 2005   nose  . KNEE ARTHROSCOPY  09/04/2012    Procedure: ARTHROSCOPY KNEE;  Surgeon: Gearlean Alf, MD;  [facility];  Service: Orthopedics;  Laterality: Right;  right knee arthroscopy with medial and lateral meniscus debridement  . MOUTH SURGERY  2004   "bone replacement; had cadavear bones put in; face was collapsing"  . NASAL SEPTUM SURGERY  ~ 1986  . TOTAL KNEE ARTHROPLASTY Right 07/07/2013   Procedure: RIGHT TOTAL KNEE ARTHROPLASTY;  Surgeon: Gearlean Alf, MD;  [facility];  Service: Orthopedics;  Laterality: Right;    Current Outpatient Prescriptions  Medication Sig Dispense Refill  . amoxicillin (AMOXIL) 500 MG tablet Take 500 mg by mouth as directed. Before dental appointments    . aspirin 325 MG tablet Take 325 mg by mouth daily.    [name] atorvastatin (LIPITOR) 80 MG tablet TAKE 1 TABLET EVERY DAY 90 tablet 3  . CARAFATE 1 GM/10ML suspension TAKE 10 MLS (1 G TOTAL) BY MOUTH EVERY 6 (SIX) HOURS. 420 mL 1  . cetirizine (ZYRTEC) 10 MG tablet Take 10 mg by mouth daily. Reported on 12/23/2015    . clonazePAM (KLONOPIN) 1 MG tablet TAKE 2 TABLETS BY MOUTH AT BEDTIME 60 tablet 0  . DEXILANT 30 MG capsule TAKE 1 CAPSULE (30 MG TOTAL) BY MOUTH DAILY. 30 capsule 3  . gabapentin (NEURONTIN) 300 MG capsule TAKE 1 TO 2 CAPSULES BY MOUTH AT BEDTIME 180 capsule 1  . hydrochlorothiazide (HYDRODIURIL) 25 MG tablet Take 1 tablet (25 mg total) by mouth daily. 90 tablet 1  . meloxicam (MOBIC) 15 MG tablet Take 15 mg by mouth daily. Reported on 12/23/2015    . metformin (FORTAMET) 1000 MG (OSM) 24 hr tablet Take 1 tablet (1,000 mg total) by mouth 2 (two) times daily with a meal. 180 tablet 3  . metoprolol succinate (TOPROL-XL) 100 MG 24 hr tablet TAKE 1 TABLET (100 MG TOTAL) BY MOUTH DAILY. TAKE WITH OR IMMEDIATELY FOLLOWING A MEAL. 90 tablet 1  . Multiple Vitamins-Minerals (PRESERVISION AREDS PO) Take 1 tablet by mouth 2 (two) times daily. Reported on 12/23/2015    . nitroGLYCERIN (NITROSTAT) 0.4 MG SL tablet Place 0.4 mg under the tongue every  5 (five) minutes as needed for chest pain. Reported on 05/09/2016    . NOVOFINE 32G X 6 MM MISC USE TO INJECT VICTOZA ONE TIME DAILY. DX: E11.9 100 each 3  . ONE TOUCH ULTRA TEST test strip CHECK BLOOD SUGAR TWICE A DAY 100 each 5  . ONETOUCH DELICA LANCETS 99991111 MISC Use to check blood sugar once daily.  Dx? E11.9 100 each 3  . Probiotic Product (PROBIOTIC DAILY PO) Take 1 tablet by mouth 3 (three) times daily before meals.    . venlafaxine XR (EFFEXOR-XR) 37.5 MG 24 hr capsule TAKE 1 CAPSULE (37.5 MG TOTAL)  BY MOUTH 2 (TWO) TIMES DAILY. 180 capsule 1  . VICTOZA 18 MG/3ML SOPN INJECT 1.8 MG INTO THE SKIN DAILY. 18 pen 1   No current facility-administered medications for this visit.     Allergies:    Allergies  Allergen Reactions  . Hydrocodone-Acetaminophen Other (See Comments)    "Changed personality" "made me very mean"  . Percocet [Oxycodone-Acetaminophen] Other (See Comments)    hallucination  . Sulfa Antibiotics Other (See Comments)    hallucinations    Social History:  The patient  reports that she has never smoked. She has never used smokeless tobacco. She reports that she drinks about 0.6 oz of alcohol per week . She reports that she does not use drugs.   ROS:  Please see the history of present illness.   Denies any fevers, chills, orthopnea, PND, syncope    PHYSICAL EXAM: VS:  BP (!) 142/70   Pulse 72   Ht 5' 2.5" (1.588 m)   Wt 155 lb 6.4 oz (70.5 kg)   SpO2 97%   BMI 27.97 kg/m  Well nourished, well developed, in no acute distress  HEENT: normal  Neck: no JVD  Cardiac:  normal S1, S2; RRR; no murmur Prior bypass scar well healed Lungs:  clear to auscultation bilaterally, no wheezing, rhonchi or rales  Abd: soft, nontender, no hepatomegaly  Ext: no edema .  Skin: warm and dry  Neuro: no focal abnormalities noted  EKG: EKG was ordered today-03/28/16-sinus rhythm, 79, T-wave inversion in V1, V2, flattening in V3, V4, V5. Nonspecific changes. Personally viewed-prior  prior-03/17/15-sinus rhythm, 69, nonspecific T-wave changes-no change from prior.03/23/14-sinus rhythm, 63, nonspecific ST flattening. Overall normal.   NUC stress 04/07/16:  Nuclear stress EF: 64%.  There was no ST segment deviation noted during stress.  Defect 1: There is a medium defect of moderate severity present in the mid anterior and apex location.  This is a low risk study.  The left ventricular ejection fraction is normal (55-65%).  ASSESSMENT AND PLAN:  1. Coronary artery disease-status post bypass/ DES diagonal placement. Continue beta blocker. Off Plavix. Aspirin 81 mg. 2. Obstructive sleep apnea-Dr.Turner, notes reviewed. 3. TIA-cerebellar. Nausea, vestibular symptoms. Eye Dr. Diagnosis. No evidence of AFIB. Has had a few issues (floating black spots, sick to stomach, occas HA) watch for any signs of orthopnea. 4. S/p knee replacement 07/07/13 - Dr. Wynelle Link.   5. Hyperlipidemia-excellent lipids. Reviewed labs. Continue with atorvastatin. LDL 48.  6. HTN-mildly elevated. Continue to monitor.We will continue with current medications. Watch for any signs of worsening orthostasis. 7. Diabetes-metformin. Glucose reviewed. She is quite pleased. A1c 8.2 now 6.9 8. Obesity-has done a good job with weight loss. Exercise. Be careful with falls. 9. We will see back in 12 months.  Signed, Candee Furbish, MD [facility]  10/12/2016 9:57 AM

## 2016-10-13 HISTORY — PX: MELANOMA EXCISION: SHX5266

## 2016-10-19 ENCOUNTER — Encounter: Payer: Self-pay | Admitting: Neurology

## 2016-10-19 ENCOUNTER — Ambulatory Visit (INDEPENDENT_AMBULATORY_CARE_PROVIDER_SITE_OTHER): Payer: Medicare Other | Admitting: Neurology

## 2016-10-19 VITALS — BP 160/76 | HR 77 | Resp 20 | Ht 63.0 in | Wt 155.0 lb

## 2016-10-19 DIAGNOSIS — Z8673 Personal history of transient ischemic attack (TIA), and cerebral infarction without residual deficits: Secondary | ICD-10-CM

## 2016-10-19 NOTE — Patient Instructions (Addendum)
Continue exercising regularly and take your medications as directed, including your aspirin, and your cholesterol medication. Use your CPAP for sleep. As discussed, secondary prevention is key after a TIA. This means: taking care of blood sugar values or diabetes management, good blood pressure (hypertension) control and optimizing cholesterol management, exercising daily or regularly within your own mobility limitations of course, and overall cardiovascular risk factor reduction, which includes screening for and treatment of obstructive sleep apnea (OSA) and weight management.  Try to drink more water, and do more walking exercises.  I will see you back as needed.

## 2016-10-19 NOTE — Progress Notes (Signed)
Subjective:    Patient ID: Morgan [name] is a 75 y.o. female.  HPI     Interim history:   Ms. 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 fib, arthritis, restless leg syndrome, sciatica, sleep apnea, obesity and prior history of TIA in 2015 per chart review, as well as overweight state, who presents for follow-up consultation of her symptoms of intermittent right-sided weakness and facial droop, as well as slurring of speech. The patient is unaccompanied today. I first met her on 06/19/2016 at the request of her primary care physician, at which time the patient reported intermittent weakness on the right side off and on for the past 4-5 months, lasting minutes at a time. She had a nonfocal exam. She was advised to be compliant with CPAP therapy for obstructive sleep apnea. She had a previous brain MRI which we reviewed. I suggested we proceed with EEG and carotid Doppler testing for additional workup. She had an EEG on 07/12/2016 and I reviewed the results:   Impression: This is a normal EEG recording in the waking state. No evidence of ictal or interictal discharges are seen. We called her with her test results.  I had ordered a carotid Doppler ultrasound, which she had on 07/05/2016 and I reviewed the results: This study is negative for hemodynamically significant stenosis involving the extracranial carotid arteries bilaterally.  Today, 10/19/2016: She reports doing okay. 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 Recovered well from both procedures. Does not need to wear glasses. Feels weaker on the right side in general, especially when fatigued.  Using her CPAP regularly. Does admit that she does not hydrate well enough. Estimates that she only drinks 1 glass of water on any given day. Does not drink a lot of other liquids either. Coffee in the morning.  Previously:  06/19/2016: She reports intermittent weakness on the right side with right facial droop, occasional slurring of speech off an on for the past 4-5 months, lasting for minutes at a time, occurring nearly every week, but no actual pattern or triggers. She also feels off-balance at times and has fallen. She has a walker and a cane available. The workers from when she had her knee replacement surgery a few years ago on the right. She still has problems with her right knee and it is more swollen. She needs prophylactic antibiotics when she has a dental procedure.  She had a fall years ago, fell down a flight of stairs some 9 years ago. No other head trauma reported. She does not drink enough water, she admits, 1-2 glasses per day, 2 glasses of half/half ice tea per day, one cup of Coffee in AM.  She uses a CPAP at night for her OSA, managed by Dr. Radford Pax.   I reviewed your office note from 05/12/2016. She reported a several month history, since November 2016 of right facial droop which comes and goes, worse when tired. She was on a baby aspirin and she was advised to increase this to a full size aspirin daily. You ordered a brain MRI and MRA. She had this on 05/26/2016 and I reviewed the results:    IMPRESSION: No acute brain finding. Mild chronic small-vessel ischemic change of the  left cerebellum and cerebral hemispheric white matter. Atrophy, gliosis and encephalomalacia of the left temporal tip probably related to old head trauma.   Intracranial MR angiography does not show any significant acquired pathology of the large or medium size vessels.   Her Past Medical History Is Significant For: Past Medical History:  Diagnosis Date  . Angina   . Arthritis   . Atrial  fibrillation (HCC)    ASPIRIN FOR BLOOD THINNER  . Atypical mole 09/22/2016  . Bulging discs    lumbar   . Complication of anesthesia    pt states has choking sensation with ET tube   . Coronary artery disease   . Depression   . Diabetes mellitus    diet controlled/on meds  . Fibromyalgia   . GERD (gastroesophageal reflux disease)   . H/O hiatal hernia   . Headache(784.0)    "recurring"  . High cholesterol   . Hyperlipidemia   . Hypertension   . 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 Migraines    "til ~ 1980"  . PONV (postoperative nausea and vomiting)   . Restless leg syndrome   . Sciatic nerve pain    "from pinched nerve"  . Sleep apnea    uses CPAP  . Weakness of right side of body    "I've had PT for it; they don't know what it's from".  CORTISONE INJECTION INTO BACK 08/30/12    Her Past Surgical History Is Significant For: Past Surgical History:  Procedure Laterality Date  . [place] STUDY N/A 12/29/2015   Procedure: [place] STUDY;  Surgeon: Manus Gunning, MD;  Location: [facility];  Service: Gastroenterology;  Laterality: N/A;  . CARPAL TUNNEL RELEASE Left 07/02/2015   Procedure: CARPAL TUNNEL RELEASE;  Surgeon: Frederik Pear, MD;  Location: [facility];  Service: Orthopedics;  Laterality: Left;  . CORONARY ANGIOPLASTY WITH STENT PLACEMENT  06/2011   "1"  . CORONARY ARTERY BYPASS GRAFT  2005   CABG X 2  . DILATION AND CURETTAGE OF UTERUS     "more than once"  . ELBOW ARTHROSCOPY Left 07/02/2015   Procedure: ARTHROSCOPY LEFT ELBOW WITH DEBRIDEMENT AND REMOVAL LOOSE BODY;  Surgeon: Frederik Pear, MD;  Location: [facility];  Service: Orthopedics;  Laterality: Left;  . ESOPHAGEAL MANOMETRY N/A 12/29/2015   Procedure: ESOPHAGEAL MANOMETRY (EM);  Surgeon: Manus Gunning, MD;  Location: [facility];  Service: Gastroenterology;  Laterality: N/A;  . FRACTURE SURGERY  ~ 2005   nose  . KNEE ARTHROSCOPY  09/04/2012   Procedure: ARTHROSCOPY  KNEE;  Surgeon: Gearlean Alf, MD;  Location: [facility];  Service: Orthopedics;  Laterality: Right;  right knee arthroscopy with medial and lateral meniscus debridement  . MOUTH SURGERY  2004   "bone replacement; had cadavear bones put in; face was collapsing"  . NASAL SEPTUM SURGERY  ~ 1986  . TOTAL KNEE ARTHROPLASTY Right 07/07/2013   Procedure: RIGHT TOTAL KNEE ARTHROPLASTY;  Surgeon: Gearlean Alf, MD;  Location: [facility];  Service: Orthopedics;  Laterality: Right;    Her Family History Is Significant For: Family History  Problem Relation Age of Onset  . Cancer Mother 15    breast cancer  . Breast cancer Mother   . Heart disease Father 98    sudden onset due to CAD  . Cancer Sister 53    colon  . Hypertension Sister   . Dementia Sister   . Diabetes Sister   . Colon cancer  Sister   . GER disease Daughter   . Hypertension Daughter   . Heart disease Brother   . Hypertension Brother   . Heart attack Neg Hx   . Stroke Neg Hx     Her Social History Is Significant For: Social History   Social History  . Marital status: Married    Spouse name: N/A  . Number of children: 2  . Years of education: college   Occupational History  . Retired     Social History Main Topics  . Smoking status: Never Smoker  . Smokeless tobacco: Never Used  . Alcohol use 0.6 oz/week    1 Glasses of wine per week     Comment: "occasionally drink wine"  . Drug use: No  . Sexual activity: Yes   Other Topics Concern  . None   Social History Narrative   Drinks 1 cup of coffee a day     Her Allergies Are:  Allergies  Allergen Reactions  . Hydrocodone-Acetaminophen Other (See Comments)    "Changed personality" "made me very mean"  . Percocet [Oxycodone-Acetaminophen] Other (See Comments)    hallucination  . Sulfa Antibiotics Other (See Comments)    hallucinations  :   Her Current Medications Are:  Outpatient Encounter Prescriptions as of 10/19/2016  Medication Sig  . amoxicillin  (AMOXIL) 500 MG tablet Take 500 mg by mouth as directed. Before dental appointments  . aspirin 325 MG tablet Take 325 mg by mouth daily.  [name] atorvastatin (LIPITOR) 80 MG tablet TAKE 1 TABLET EVERY DAY  . CARAFATE 1 GM/10ML suspension TAKE 10 MLS (1 G TOTAL) BY MOUTH EVERY 6 (SIX) HOURS.  . cetirizine (ZYRTEC) 10 MG tablet Take 10 mg by mouth daily. Reported on 12/23/2015  . clonazePAM (KLONOPIN) 1 MG tablet TAKE 2 TABLETS BY MOUTH AT BEDTIME  . DEXILANT 30 MG capsule TAKE 1 CAPSULE (30 MG TOTAL) BY MOUTH DAILY.  [name] gabapentin (NEURONTIN) 300 MG capsule TAKE 1 TO 2 CAPSULES BY MOUTH AT BEDTIME  . hydrochlorothiazide (HYDRODIURIL) 25 MG tablet Take 1 tablet (25 mg total) by mouth daily.  . meloxicam (MOBIC) 15 MG tablet Take 15 mg by mouth daily. Reported on 12/23/2015  . metformin (FORTAMET) 1000 MG (OSM) 24 hr tablet Take 1 tablet (1,000 mg total) by mouth 2 (two) times daily with a meal.  . metoprolol succinate (TOPROL-XL) 100 MG 24 hr tablet TAKE 1 TABLET (100 MG TOTAL) BY MOUTH DAILY. TAKE WITH OR IMMEDIATELY FOLLOWING A MEAL.  . Multiple Vitamins-Minerals (PRESERVISION AREDS PO) Take 1 tablet by mouth 2 (two) times daily. Reported on 12/23/2015  . nitroGLYCERIN (NITROSTAT) 0.4 MG SL tablet Place 0.4 mg under the tongue every 5 (five) minutes as needed for chest pain. Reported on 05/09/2016  . NOVOFINE 32G X 6 MM MISC USE TO INJECT VICTOZA ONE TIME DAILY. DX: E11.9  . ONE TOUCH ULTRA TEST test strip CHECK BLOOD SUGAR TWICE A DAY  . ONETOUCH DELICA LANCETS 95G MISC Use to check blood sugar once daily.  Dx? E11.9  . Probiotic Product (PROBIOTIC DAILY PO) Take 1 tablet by mouth 3 (three) times daily before meals.  . venlafaxine XR (EFFEXOR-XR) 37.5 MG 24 hr capsule TAKE 1 CAPSULE (37.5 MG TOTAL) BY MOUTH 2 (TWO) TIMES DAILY.  [name] VICTOZA 18 MG/3ML SOPN INJECT 1.8 MG INTO THE SKIN DAILY.   No facility-administered encounter medications on file as of 10/19/2016.   :  Review of Systems:  Out of a complete  14 point  review of systems, all are reviewed and negative with the exception of these symptoms as listed below: Review of Systems  Neurological:       Pt presents today for follow up. 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 She does report occasionally "drooping."    Objective:  Neurologic Exam  Physical Exam Physical Examination:   Vitals:   10/19/16 1306  BP: (!) 160/76  Pulse: 77  Resp: 20   General Examination: The patient is a very pleasant 75 y.o. female in no acute distress. She appears well-developed and well-nourished and well groomed.   HEENT: Normocephalic, atraumatic, pupils are equal, round and reactive to light and accommodation. Funduscopic exam is good today, s/p b/l cataract extractions. She is visually impaired on the right. This is not new. Extraocular tracking is good without limitation to gaze excursion or nystagmus noted. Normal smooth pursuit is noted. Healing scar R lateral neck with bandage. Hearing is grossly intact. Face is symmetric with normal facial animation and normal facial sensation. Speech is clear with no dysarthria noted. There is no hypophonia. There is no lip, neck/head, jaw or voice tremor. Neck is supple with full range of passive and active motion. There are no carotid bruits on auscultation. Oropharynx exam reveals: moderate to severe mouth dryness, adequate dental hygiene and mild airway crowding. Mallampati is class II. Tongue protrudes centrally and palate elevates symmetrically.  Chest: Clear to auscultation without wheezing, rhonchi or crackles noted.  Heart: S1+S2+0, regular and normal without murmurs, rubs or gallops noted.   Abdomen: Soft, non-tender and non-distended with normal bowel sounds appreciated on auscultation.  Extremities: There is no pitting edema in the distal lower extremities bilaterally. Pedal pulses are intact.  Skin: Warm and dry without  trophic changes noted. There are no varicose veins.  Musculoskeletal: exam reveals no obvious joint deformities, tenderness or joint swelling or erythema, with the exception of slight right knee swelling, overall unremarkable scar from right total knee replacement surgery.   Neurologically:  Mental status: The patient is awake, alert and oriented in all 4 spheres. Her immediate and remote memory, attention, language skills and fund of knowledge are appropriate. There is no evidence of aphasia, agnosia, apraxia or anomia. Speech is clear with normal prosody and enunciation. Thought process is linear. Mood is normal and affect is normal.  Cranial nerves II - XII are as described above under HEENT exam. In addition: shoulder shrug is normal with equal shoulder height noted. Motor exam: Normal bulk, strength and tone is noted. There is no drift, tremor or rebound. Romberg is negative, except for minimal swaying. Reflexes are 1+ throughout. Fine motor skills and coordination: intact with normal finger taps, normal hand movements, normal rapid alternating patting, normal foot taps and normal foot agility.  Cerebellar testing: No dysmetria or intention tremor on finger to nose testing. Heel to shin is unremarkable bilaterally. There is no truncal or gait ataxia.  Sensory exam: intact to light touch, with the exception of decreased pinprick and temperature sense around the scar of the right knee.  Gait, station and balance: She stands easily. No veering to one side is noted. No leaning to one side is noted. Posture is age-appropriate and stance is narrow based. Gait shows normal stride length and normal pace. She turns a little insecurely, a bit better.   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 fib, arthritis, restless leg syndrome, sciatica, sleep apnea on CPAP, obesity and prior history of TIA in 2015 per chart review, as well as overweight state, who presents for follow-up consultation of her intermittent episodes of weakness on the right, slurring of speech intermittently as well. Symptoms are improved, workup was negative for stroke. She had a brain MRI in July which showed no acute changes. We did an EEG which was normal and the wake state and carotid Doppler testing in August which was negative. She is on a daily aspirin and atorvastatin. She had no new findings on exam today. In the interim, she had bilateral cataract surgeries and also melanoma removal from the right lateral neck with good recovery from these procedures. She walks independently without a walking stick or walker. She is reminded to be better with her day-to-day exercises and encouraged to walk on a regular basis as well as increase her oral water intake. From my end of things I suggested as needed follow-up. I answered all her questions today and we reviewed recent test results again today, and she was in agreement. We again discussed risk factor modification.  I spent 25 minutes in total face-to-face time with the patient, more than 50% of which was spent in counseling and coordination of care, reviewing test results, reviewing medication and discussing or reviewing the diagnosis of TIA, prognosis and treatment options.

## 2016-10-26 ENCOUNTER — Other Ambulatory Visit: Payer: Self-pay | Admitting: Family Medicine

## 2016-10-26 NOTE — Telephone Encounter (Signed)
Last office visit 05/12/2016.  Last refilled 09/19/2016 for #30 with no refills.  Ok to refill?

## 2016-10-26 NOTE — Telephone Encounter (Signed)
Clonazepam called into [id number] - [place], [place] - [address] Phone: [phone] 

## 2016-10-31 ENCOUNTER — Ambulatory Visit (INDEPENDENT_AMBULATORY_CARE_PROVIDER_SITE_OTHER): Payer: Medicare Other | Admitting: Podiatry

## 2016-10-31 DIAGNOSIS — B351 Tinea unguium: Secondary | ICD-10-CM | POA: Diagnosis not present

## 2016-10-31 DIAGNOSIS — L603 Nail dystrophy: Secondary | ICD-10-CM

## 2016-10-31 DIAGNOSIS — L608 Other nail disorders: Secondary | ICD-10-CM

## 2016-10-31 DIAGNOSIS — M79609 Pain in unspecified limb: Secondary | ICD-10-CM

## 2016-11-11 NOTE — Progress Notes (Signed)
   SUBJECTIVE Patient  presents to office today complaining of elongated, thickened nails. Pain while ambulating in shoes. Patient is unable to trim their own nails.   OBJECTIVE General Patient is awake, alert, and oriented x 3 and in no acute distress. Derm Skin is dry and supple bilateral. Negative open lesions or macerations. Remaining integument unremarkable. Nails are tender, long, thickened and dystrophic with subungual debris, consistent with onychomycosis, 1-5 bilateral. No signs of infection noted. Vasc  DP and PT pedal pulses palpable bilaterally. Temperature gradient within normal limits.  Neuro Epicritic and protective threshold sensation diminished bilaterally.  Musculoskeletal Exam No symptomatic pedal deformities noted bilateral. Muscular strength within normal limits.  ASSESSMENT 1. Onychodystrophic nails 1-5 bilateral with hyperkeratosis of nails.  2. Onychomycosis of nail due to dermatophyte bilateral 3. Pain in foot bilateral  PLAN OF CARE 1. Patient evaluated today.  2. Instructed to maintain good pedal hygiene and foot care.  3. Mechanical debridement of nails 1-5 bilaterally performed using a nail nipper. Filed with dremel without incident.  4. Return to clinic in 3 mos.    [facility] M. Evans, DPM [facility]  Dr. [facility] M. Evans, DPM    [address]                                        [place], [place] [address]                Office [phone]  Fax [phone]      

## 2016-11-14 ENCOUNTER — Ambulatory Visit (INDEPENDENT_AMBULATORY_CARE_PROVIDER_SITE_OTHER): Payer: Medicare Other

## 2016-11-14 VITALS — BP 118/70 | HR 72 | Temp 97.6°F | Ht 62.5 in | Wt 153.4 lb

## 2016-11-14 DIAGNOSIS — Z0000 Encounter for general adult medical examination without abnormal findings: Secondary | ICD-10-CM | POA: Diagnosis not present

## 2016-11-14 DIAGNOSIS — E119 Type 2 diabetes mellitus without complications: Secondary | ICD-10-CM | POA: Diagnosis not present

## 2016-11-14 LAB — HEMOGLOBIN A1C: HEMOGLOBIN A1C: 6.2 % (ref 4.6–6.5)

## 2016-11-14 LAB — MICROALBUMIN / CREATININE URINE RATIO
CREATININE, U: 141.2 mg/dL
MICROALB UR: 1.2 mg/dL (ref 0.0–1.9)
MICROALB/CREAT RATIO: 0.8 mg/g (ref 0.0–30.0)

## 2016-11-14 NOTE — Progress Notes (Signed)
Subjective:   Morgan [name] is a 76 y.o. female who presents for Medicare Annual (Subsequent) preventive examination.  Review of Systems:  N/A Cardiac Risk Factors include: advanced age (>78men, >38 women);diabetes mellitus;dyslipidemia;hypertension     Objective:     Vitals: BP 118/70 (BP Location: Left Arm, Patient Position: Sitting, Cuff Size: Normal)   Pulse 72   Temp 97.6 F (36.4 C) (Oral)   Ht 5' 2.5" (1.588 m) Comment: no shoes  Wt 153 lb 6 oz (69.6 kg)   SpO2 93%   BMI 27.61 kg/m   Body mass index is 27.61 kg/m.   Tobacco History  Smoking Status  . Never Smoker  Smokeless Tobacco  . Never Used     Counseling given: No   Past Medical History:  Diagnosis Date  . Angina   . Arthritis   . Atrial fibrillation (HCC)    ASPIRIN FOR BLOOD THINNER  . Atypical mole 09/22/2016  . Bulging discs    lumbar   . Complication of anesthesia    pt states has choking sensation with ET tube   . Coronary artery disease   . Depression   . Diabetes mellitus    diet controlled/on meds  . Fibromyalgia   . GERD (gastroesophageal reflux disease)   . H/O hiatal hernia   . Headache(784.0)    "recurring"  . High cholesterol   . Hyperlipidemia   . Hypertension   . 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 Migraines    "til ~ 1980"  . PONV (postoperative nausea and vomiting)   . Restless leg syndrome   . Sciatic nerve pain    "from pinched nerve"  . Sleep apnea    uses CPAP  . Weakness of right side of body    "I've had PT for it; they don't know what it's from".  CORTISONE INJECTION INTO BACK 08/30/12   Past Surgical History:  Procedure Laterality Date  . [place] STUDY N/A 12/29/2015   Procedure: Pump Back STUDY;  Surgeon: Manus Gunning, MD;  Location: [facility];  Service: Gastroenterology;  Laterality: N/A;  . CARPAL TUNNEL RELEASE Left 07/02/2015   Procedure: CARPAL TUNNEL RELEASE;  Surgeon: Frederik Pear, MD;  Location: [place] [facility];  Service:  Orthopedics;  Laterality: Left;  . CATARACT EXTRACTION, BILATERAL Bilateral    Oct and Nov 2017  . CORONARY ANGIOPLASTY WITH STENT PLACEMENT  06/2011   "1"  . CORONARY ARTERY BYPASS GRAFT  2005   CABG X 2  . DILATION AND CURETTAGE OF UTERUS     "more than once"  . ELBOW ARTHROSCOPY Left 07/02/2015   Procedure: ARTHROSCOPY LEFT ELBOW WITH DEBRIDEMENT AND REMOVAL LOOSE BODY;  Surgeon: Frederik Pear, MD;  Location: [facility];  Service: Orthopedics;  Laterality: Left;  . ESOPHAGEAL MANOMETRY N/A 12/29/2015   Procedure: ESOPHAGEAL MANOMETRY (EM);  Surgeon: Manus Gunning, MD;  Location: [facility];  Service: Gastroenterology;  Laterality: N/A;  . FRACTURE SURGERY  ~ 2005   nose  . KNEE ARTHROSCOPY  09/04/2012   Procedure: ARTHROSCOPY KNEE;  Surgeon: Gearlean Alf, MD;  Location: [facility];  Service: Orthopedics;  Laterality: Right;  right knee arthroscopy with medial and lateral meniscus debridement  . 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 MOUTH SURGERY  2004   "bone replacement; had cadavear bones put in; face was collapsing"  . NASAL SEPTUM SURGERY  ~ 1986  . TOTAL KNEE ARTHROPLASTY Right 07/07/2013  Procedure: RIGHT TOTAL KNEE ARTHROPLASTY;  Surgeon: Gearlean Alf, MD;  Location: [facility];  Service: Orthopedics;  Laterality: Right;   Family History  Problem Relation Age of Onset  . Cancer Mother 74    breast cancer  . Breast cancer Mother   . Heart disease Father 76    sudden onset due to CAD  . Cancer Sister 51    colon  . Hypertension Sister   . Dementia Sister   . Diabetes Sister   . Colon cancer Sister   . GER disease Daughter   . Hypertension Daughter   . Heart disease Brother   . Hypertension Brother   . Heart attack Neg Hx   . Stroke Neg Hx    History  Sexual Activity  . Sexual activity: Yes    Outpatient Encounter Prescriptions as of 11/14/2016  Medication Sig  . amoxicillin (AMOXIL) 500 MG tablet Take 500 mg by mouth as  directed. Before dental appointments  . aspirin 325 MG tablet Take 325 mg by mouth daily.  [name] atorvastatin (LIPITOR) 80 MG tablet TAKE 1 TABLET EVERY DAY  . CARAFATE 1 GM/10ML suspension TAKE 10 MLS (1 G TOTAL) BY MOUTH EVERY 6 (SIX) HOURS.  . cetirizine (ZYRTEC) 10 MG tablet Take 10 mg by mouth daily. Reported on 12/23/2015  . clonazePAM (KLONOPIN) 1 MG tablet TAKE 2 TABLETS BY MOUTH AT BEDTIME  . DEXILANT 30 MG capsule TAKE 1 CAPSULE (30 MG TOTAL) BY MOUTH DAILY.  [name] gabapentin (NEURONTIN) 300 MG capsule TAKE 1 TO 2 CAPSULES BY MOUTH AT BEDTIME  . hydrochlorothiazide (HYDRODIURIL) 25 MG tablet Take 1 tablet (25 mg total) by mouth daily.  . meloxicam (MOBIC) 15 MG tablet Take 15 mg by mouth daily. Reported on 12/23/2015  . metformin (FORTAMET) 1000 MG (OSM) 24 hr tablet Take 1 tablet (1,000 mg total) by mouth 2 (two) times daily with a meal.  . metoprolol succinate (TOPROL-XL) 100 MG 24 hr tablet TAKE 1 TABLET (100 MG TOTAL) BY MOUTH DAILY. TAKE WITH OR IMMEDIATELY FOLLOWING A MEAL.  . Multiple Vitamins-Minerals (PRESERVISION AREDS PO) Take 1 tablet by mouth 2 (two) times daily. Reported on 12/23/2015  . nitroGLYCERIN (NITROSTAT) 0.4 MG SL tablet Place 0.4 mg under the tongue every 5 (five) minutes as needed for chest pain. Reported on 05/09/2016  . NOVOFINE 32G X 6 MM MISC USE TO INJECT VICTOZA ONE TIME DAILY. DX: E11.9  . ONE TOUCH ULTRA TEST test strip CHECK BLOOD SUGAR TWICE A DAY  . ONETOUCH DELICA LANCETS 99991111 MISC Use to check blood sugar once daily.  Dx? E11.9  . Probiotic Product (PROBIOTIC DAILY PO) Take 1 tablet by mouth daily.   [name] venlafaxine XR (EFFEXOR-XR) 37.5 MG 24 hr capsule TAKE 1 CAPSULE (37.5 MG TOTAL) BY MOUTH 2 (TWO) TIMES DAILY.  [name] VICTOZA 18 MG/3ML SOPN INJECT 1.8 MG INTO THE SKIN DAILY.   No facility-administered encounter medications on file as of 11/14/2016.     Activities of Daily Living In your present state of health, do you have any difficulty performing the following  activities: 11/14/2016  Hearing? N  Vision? N  Difficulty concentrating or making decisions? N  Walking or climbing stairs? Y  Dressing or bathing? N  Doing errands, shopping? N  Preparing Food and eating ? N  Using the Toilet? N  In the past six months, have you accidently leaked urine? Y  Do you have problems with loss of bowel control? N  Managing your Medications? N  Managing your Finances? N  Housekeeping or managing your Housekeeping? N  Some recent data might be hidden    Patient Care Team: Jinny Sanders, MD as PCP - General (Family Medicine) Monna Fam, MD as Consulting Physician (Ophthalmology) Katherine Mantle, OD as Consulting Physician (Optometry)    Assessment:     Hearing Screening   125Hz  250Hz  500Hz  1000Hz  2000Hz  3000Hz  4000Hz  6000Hz  8000Hz   Right ear:   0 0 40  40    Left ear:   40 40 40  40    Vision Screening Comments: Last vision exam in Nov 2017 with Dr. Georgette Dover. Zadie Rhine is regular eye doctor   Exercise Activities and Dietary recommendations Current Exercise Habits: The patient does not participate in regular exercise at present, Exercise limited by: None identified  Goals    . Increase water intake          Starting 11/14/2016, I will attempt to drink at least 6 glasses of water daily and increase exercise to at least 15 min daily.       Fall Risk Fall Risk  11/14/2016 06/19/2016 08/10/2015 07/23/2014  Falls in the past year? No Yes Yes Yes  Number falls in past yr: - 2 or more 2 or more 2 or more  Injury with Fall? - No Yes -  Risk Factor Category  - - - High Fall Risk  Risk for fall due to : - History of fall(s) Impaired mobility Impaired balance/gait  Follow up - Falls prevention discussed - -   Depression Screen PHQ 2/9 Scores 11/14/2016 08/10/2015 07/23/2014  PHQ - 2 Score 0 1 0     Cognitive Function MMSE - Mini Mental State Exam 11/14/2016  Orientation to time 5  Orientation to Place 5  Registration 3  Attention/ Calculation 0  Recall 3    Language- name 2 objects 0  Language- repeat 1  Language- follow 3 step command 3  Language- read & follow direction 0  Write a sentence 0  Copy design 0  Total score 20        Immunization History  Administered Date(s) Administered  . Influenza, High Dose Seasonal PF 08/04/2016  . Influenza,inj,Quad PF,36+ Mos 07/23/2014, 08/10/2015  . Pneumococcal Conjugate-13 10/27/2014  . Pneumococcal Polysaccharide-23 09/02/2007, 10/03/2013  . Td 11/14/2007  . Zoster 02/09/2010   Screening Tests Health Maintenance  Topic Date Due  . OPHTHALMOLOGY EXAM  05/02/2017  . FOOT EXAM  05/12/2017  . HEMOGLOBIN A1C  05/14/2017  . TETANUS/TDAP  11/13/2017  . URINE MICROALBUMIN  11/14/2017  . MAMMOGRAM  07/06/2018  . COLONOSCOPY  10/14/2019  . INFLUENZA VACCINE  Completed  . DEXA SCAN  Completed  . ZOSTAVAX  Completed  . PNA vac Low Risk Adult  Completed      Plan:     I have personally reviewed and addressed the Medicare Annual Wellness questionnaire and have noted the following in the patient's chart:  A. Medical and social history B. Use of alcohol, tobacco or illicit drugs  C. Current medications and supplements D. Functional ability and status E.  Nutritional status F.  Physical activity G. Advance directives H. List of other physicians I.  Hospitalizations, surgeries, and ER visits in previous 12 months J.  [place] to include hearing, vision, cognitive, depression L. Referrals and appointments - none  In addition, I have reviewed and discussed with patient certain preventive protocols, quality metrics, and best practice recommendations. A written personalized care plan for preventive services as  well as general preventive health recommendations were provided to patient.  See attached scanned questionnaire for additional information.   Signed,   Lindell Noe, MHA, BS, LPN Health Coach

## 2016-11-14 NOTE — Patient Instructions (Signed)
Morgan [name] , Thank you for taking time to come for your Medicare Wellness Visit. I appreciate your ongoing commitment to your health goals. Please review the following plan we discussed and let me know if I can assist you in the future.   These are the goals we discussed: Goals    . Increase water intake          Starting 11/14/2016, I will attempt to drink at least 6 glasses of water daily and increase exercise to at least 15 min daily.        This is a list of the screening recommended for you and due dates:  Health Maintenance  Topic Date Due  . Eye exam for diabetics  05/02/2017  . Complete foot exam   05/12/2017  . Hemoglobin A1C  05/14/2017  . Tetanus Vaccine  11/13/2017  . Urine Protein Check  11/14/2017  . Mammogram  07/06/2018  . Colon Cancer Screening  10/14/2019  . Flu Shot  Completed  . DEXA scan (bone density measurement)  Completed  . Shingles Vaccine  Completed  . Pneumonia vaccines  Completed   Preventive Care for Adults  A healthy lifestyle and preventive care can promote health and wellness. Preventive health guidelines for adults include the following key practices.  . A routine yearly physical is a good way to check with your health care provider about your health and preventive screening. It is a chance to share any concerns and updates on your health and to receive a thorough exam.  . Visit your dentist for a routine exam and preventive care every 6 months. Brush your teeth twice a day and floss once a day. Good oral hygiene prevents tooth decay and gum disease.  . The frequency of eye exams is based on your age, health, family medical history, use  of contact lenses, and other factors. Follow your health care provider's ecommendations for frequency of eye exams.  . Eat a healthy diet. Foods like vegetables, fruits, whole grains, low-fat dairy products, and lean protein foods contain the nutrients you need without too many calories. Decrease your intake of  foods high in solid fats, added sugars, and salt. Eat the right amount of calories for you. Get information about a proper diet from your health care provider, if necessary.  . Regular physical exercise is one of the most important things you can do for your health. Most adults should get at least 150 minutes of moderate-intensity exercise (any activity that increases your heart rate and causes you to sweat) each week. In addition, most adults need muscle-strengthening exercises on 2 or more days a week.  Silver Sneakers may be a benefit available to you. To determine eligibility, you may visit the website: www.silversneakers.com or contact program at 907-596-4681 Mon-Fri between 8AM-8PM.   . Maintain a healthy weight. The body mass index (BMI) is a screening tool to identify possible weight problems. It provides an estimate of body fat based on height and weight. Your health care provider can find your BMI and can help you achieve or maintain a healthy weight.   For adults 20 years and older: ? A BMI below 18.5 is considered underweight. ? A BMI of 18.5 to 24.9 is normal. ? A BMI of 25 to 29.9 is considered overweight. ? A BMI of 30 and above is considered obese.   . Maintain normal blood lipids and cholesterol levels by exercising and minimizing your intake of saturated fat. Eat a balanced diet with plenty  of fruit and vegetables. Blood tests for lipids and cholesterol should begin at age 15 and be repeated every 5 years. If your lipid or cholesterol levels are high, you are over 50, or you are at high risk for heart disease, you may need your cholesterol levels checked more frequently. Ongoing high lipid and cholesterol levels should be treated with medicines if diet and exercise are not working.  . If you smoke, find out from your health care provider how to quit. If you do not use tobacco, please do not start.  . If you choose to drink alcohol, please do not consume more than 2 drinks per  day. One drink is considered to be 12 ounces (355 mL) of beer, 5 ounces (148 mL) of wine, or 1.5 ounces (44 mL) of liquor.  . If you are 45-30 years old, ask your health care provider if you should take aspirin to prevent strokes.  . Use sunscreen. Apply sunscreen liberally and repeatedly throughout the day. You should seek shade when your shadow is shorter than you. Protect yourself by wearing long sleeves, pants, a wide-brimmed hat, and sunglasses year round, whenever you are outdoors.  . Once a month, do a whole body skin exam, using a mirror to look at the skin on your back. Tell your health care provider of new moles, moles that have irregular borders, moles that are larger than a pencil eraser, or moles that have changed in shape or color.

## 2016-11-14 NOTE — Progress Notes (Signed)
PCP notes:   Health maintenance:  Microalbumin - completed A1C - completed  Abnormal screenings:   Hearing - failed  Patient concerns:   Pt is still experiencing 1-2 episodes of diarrhea per month.  Pt want to discuss urinary incontinence.   Nurse concerns:  None  Next PCP appt:   11/23/16 @ 1400

## 2016-11-14 NOTE — Progress Notes (Signed)
Pre visit review using our clinic review tool, if applicable. No additional management support is needed unless otherwise documented below in the visit note. 

## 2016-11-15 NOTE — Progress Notes (Signed)
I reviewed health advisor's note, was available for consultation, and agree with documentation and plan.  Zhion Pevehouse, MD [facility]  

## 2016-11-23 ENCOUNTER — Encounter: Payer: Self-pay | Admitting: Family Medicine

## 2016-11-23 ENCOUNTER — Ambulatory Visit (INDEPENDENT_AMBULATORY_CARE_PROVIDER_SITE_OTHER): Payer: Medicare Other | Admitting: Family Medicine

## 2016-11-23 VITALS — BP 120/64 | HR 70 | Temp 97.4°F | Ht 62.5 in | Wt 154.8 lb

## 2016-11-23 DIAGNOSIS — C439 Malignant melanoma of skin, unspecified: Secondary | ICD-10-CM | POA: Insufficient documentation

## 2016-11-23 DIAGNOSIS — I10 Essential (primary) hypertension: Secondary | ICD-10-CM

## 2016-11-23 DIAGNOSIS — C434 Malignant melanoma of scalp and neck: Secondary | ICD-10-CM

## 2016-11-23 DIAGNOSIS — E7800 Pure hypercholesterolemia, unspecified: Secondary | ICD-10-CM | POA: Diagnosis not present

## 2016-11-23 DIAGNOSIS — E119 Type 2 diabetes mellitus without complications: Secondary | ICD-10-CM | POA: Diagnosis not present

## 2016-11-23 DIAGNOSIS — I2583 Coronary atherosclerosis due to lipid rich plaque: Secondary | ICD-10-CM

## 2016-11-23 DIAGNOSIS — N3941 Urge incontinence: Secondary | ICD-10-CM

## 2016-11-23 DIAGNOSIS — F324 Major depressive disorder, single episode, in partial remission: Secondary | ICD-10-CM | POA: Diagnosis not present

## 2016-11-23 DIAGNOSIS — I2510 Atherosclerotic heart disease of native coronary artery without angina pectoris: Secondary | ICD-10-CM | POA: Diagnosis not present

## 2016-11-23 DIAGNOSIS — Z8673 Personal history of transient ischemic attack (TIA), and cerebral infarction without residual deficits: Secondary | ICD-10-CM

## 2016-11-23 DIAGNOSIS — K219 Gastro-esophageal reflux disease without esophagitis: Secondary | ICD-10-CM

## 2016-11-23 LAB — HM DIABETES FOOT EXAM

## 2016-11-23 MED ORDER — MIRABEGRON ER 25 MG PO TB24: 25.0000 mg | ORAL_TABLET | Freq: Every day | ORAL | 11 refills | Status: DC

## 2016-11-23 NOTE — Assessment & Plan Note (Signed)
Followed by derm q 6 months.

## 2016-11-23 NOTE — Patient Instructions (Addendum)
Get back on track with regualr exercise. Work on low cholesterol diet.  Call if blood sugars are running < 70.

## 2016-11-23 NOTE — Assessment & Plan Note (Signed)
Due for re-eval in 01/2017

## 2016-11-23 NOTE — Progress Notes (Signed)
Subjective:    Patient ID: Morgan [name], female    DOB: 1941-07-16, 76 y.o.   MRN: [id number]  HPI  76 year old female presents for Part 2 AMW I have personally reviewed the Medicare Annual Wellness questionnaire and have noted The patient saw Candis Musa, LPN for medicare wellness. Note reviewed in detail and important notes copied below. Health maintenance:  Microalbumin - completed A1C - completed Abnormal screenings:  Hearing - failed, left ear  Patient concerns:  Pt is still experiencing 1-2 episodes of diarrhea per month. Pt want to discuss urinary incontinence.   Nurse concerns: None   She has been having urge incontinence for 5-6 months. No dysuria.    Diarrhea has seemed to improved. Followed by GI. Dr. Havery Moros  EGD 2/2107GERD, treated with carafate BID prn and Dexilant daily.  Dr. Rexene Alberts: 10/19/2016 following possible TIA.[name] Slurred speech and left side weakness. EEG neg.  On ASA 325 mg daily.  Diabetes:   Improving control on metformin and victoza 1.8 mcg ( no SE) Lab Results  Component Value Date   HGBA1C 6.2 11/14/2016  Using medications without difficulties: Hypoglycemic episodes:nnone Hyperglycemic episodes:none Feet problems: no ulcers Blood Sugars averaging: FBS 110-140 eye exam within last year: yes Wt Readings from Last 3 Encounters:  11/23/16 154 lb 12 oz (70.2 kg)  11/14/16 153 lb 6 oz (69.6 kg)  10/19/16 155 lb (70.3 kg)  Body mass index is 27.85 kg/m.  Depression , major recurrent: Stable control on venlafaxine.  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  Hypertension:   well controlled on HCTZ,  BP Readings from Last 3 Encounters:  11/23/16 120/64  11/14/16 118/70  10/19/16 (!) 160/76  Using medication without problems or lightheadedness:  none Chest pain with exertion:none Edema:none Short of breath: none except SOB with walking up inclines Average home [id number] at  home Other issues:  Elevated Cholesterol:  Due for re-eval in 01/2017 on lipitor 80.. High dose as high risk CVD with DM. Lab Results  Component Value Date   CHOL 142 02/03/2016   HDL 46.70 02/03/2016   LDLCALC 77 02/03/2016   TRIG 91.0 02/03/2016   CHOLHDL 3 02/03/2016  Using medications without problems: none Muscle aches: none Diet compliance: moderate Exercise:None Other complaints:  CAD, followed by Cardiology Dr. Marlou Porch. Stable.  Social History /Family History/Past Medical History reviewed and updated if needed.  Review of Systems  Constitutional: Negative for fatigue and fever.  HENT: Negative for congestion.   Eyes: Negative for pain.  Respiratory: Negative for cough and shortness of breath.   Cardiovascular: Negative for chest pain, palpitations and leg swelling.  Gastrointestinal: Negative for abdominal pain.  Genitourinary: Negative for dysuria and vaginal bleeding.  Musculoskeletal: Positive for back pain.  Neurological: Negative for syncope, light-headedness and headaches.  Psychiatric/Behavioral: Negative for dysphoric mood.       Objective:   Physical Exam  Constitutional: Vital signs are normal. She appears well-developed and well-nourished. She is cooperative.  Non-toxic appearance. She does not appear ill. No distress.  HENT:  Head: Normocephalic.  Right Ear: Hearing, tympanic membrane, external ear and ear canal normal.  Left Ear: Hearing, tympanic membrane, external ear and ear canal normal.  Nose: Nose normal.  Eyes: Conjunctivae, EOM and lids are normal. Pupils are equal, round, and reactive to light. Lids are everted and swept, no foreign bodies found.  Neck: Trachea normal and normal range of motion. Neck supple. Carotid bruit is not present.  No thyroid mass and no thyromegaly present.  Cardiovascular: Normal rate, regular rhythm, S1 normal, S2 normal, normal heart sounds and intact distal pulses.  Exam reveals no gallop.   No murmur  heard. Pulmonary/Chest: Effort normal and breath sounds normal. No respiratory distress. She has no wheezes. She has no rhonchi. She has no rales.  Abdominal: Soft. Normal appearance and bowel sounds are normal. She exhibits no distension, no fluid wave, no abdominal bruit and no mass. There is no hepatosplenomegaly. There is no tenderness. There is no rebound, no guarding and no CVA tenderness. No hernia.  Lymphadenopathy:    She has no cervical adenopathy.    She has no axillary adenopathy.  Neurological: She is alert. She has normal strength. No cranial nerve deficit or sensory deficit.  Skin: Skin is warm, dry and intact. No rash noted.  Psychiatric: Her speech is normal and behavior is normal. Judgment normal. Her mood appears not anxious. Cognition and memory are normal. She does not exhibit a depressed mood.     Diabetic foot exam: Normal inspection No skin breakdown Some calluses  Normal DP pulses Normal sensation to light touch and monofilament Nails thickenedl      Assessment & Plan:  The patient's preventative maintenance and recommended screening tests for an annual wellness exam were reviewed in full today. Brought up to date unless services declined.  Counselled on the importance of diet, exercise, and its role in overall health and mortality. The patient's FH and SH was reviewed, including their home life, tobacco status, and drug and alcohol status.   Vaccines: uptodate Pap/DVE:  No pap, DVE  Indicated. Mammo: 06/2016, no yearly breast exam. Bone Density: 09/2014 osteopenia, repeat 5 years Colon:last 2015 . Sister with strong family history colon cancer, plan repeat 5 years  Smoking Status:None

## 2016-11-23 NOTE — Assessment & Plan Note (Signed)
Good control.. Continue current dose of metformin and victoza.

## 2016-11-23 NOTE — Assessment & Plan Note (Signed)
Well controlled. Continue current medication. Encouraged exercise, weight loss, healthy eating habits.  

## 2016-11-23 NOTE — Assessment & Plan Note (Signed)
On ASA 325 mg daily.  Risk factor modification.

## 2016-11-23 NOTE — Assessment & Plan Note (Signed)
Start Myrbetriq 25 mg daily, trial. Call if not improving in 6-12 weeks.

## 2016-11-23 NOTE — Assessment & Plan Note (Signed)
Stable control on Dexilant and Carafate.

## 2016-11-23 NOTE — Progress Notes (Signed)
Pre visit review using our clinic review tool, if applicable. No additional management support is needed unless otherwise documented below in the visit note. 

## 2016-11-24 ENCOUNTER — Other Ambulatory Visit: Payer: Self-pay | Admitting: Family Medicine

## 2016-12-06 ENCOUNTER — Other Ambulatory Visit: Payer: Self-pay | Admitting: Family Medicine

## 2016-12-06 NOTE — Telephone Encounter (Signed)
Last office visit 11/23/2016.  Last refilled 10/26/2016 for #60 with no refills.  Ok to refill?

## 2016-12-07 NOTE — Telephone Encounter (Signed)
Clonazepam called into [id number] - [place], [place] - [address] Phone: [phone] 

## 2016-12-09 ENCOUNTER — Other Ambulatory Visit: Payer: Self-pay | Admitting: Family Medicine

## 2016-12-09 ENCOUNTER — Other Ambulatory Visit: Payer: Self-pay | Admitting: Gastroenterology

## 2016-12-24 ENCOUNTER — Other Ambulatory Visit: Payer: Self-pay | Admitting: Gastroenterology

## 2017-01-16 ENCOUNTER — Other Ambulatory Visit: Payer: Self-pay | Admitting: Family Medicine

## 2017-01-16 NOTE — Telephone Encounter (Signed)
Clonazepam called into [id number] - [place], [place] - [address] Phone: [phone] 

## 2017-01-16 NOTE — Telephone Encounter (Signed)
Last office visit 11/23/16.  Last refilled 12/07/16 for #60 with no refills.  Ok to refill?

## 2017-01-30 ENCOUNTER — Telehealth: Payer: Self-pay | Admitting: Cardiology

## 2017-01-30 NOTE — Telephone Encounter (Signed)
New Message:    Pt says she needs to have her C Pap calabrated please.Pt says she is really,really tired.

## 2017-01-31 NOTE — Telephone Encounter (Signed)
Please order a 2 week autotitration from 4 to 20cm H2O

## 2017-01-31 NOTE — Telephone Encounter (Signed)
Called the patient and she explained that her machine is set on 4cm H20 and she wonders if is this is enouigh. She is tired during the day and can fall asleep while sitting up most of the time now. I have pulled a download to be scanned in today

## 2017-02-02 ENCOUNTER — Telehealth: Payer: Self-pay | Admitting: *Deleted

## 2017-02-02 DIAGNOSIS — G4733 Obstructive sleep apnea (adult) (pediatric): Secondary | ICD-10-CM

## 2017-02-02 NOTE — Telephone Encounter (Deleted)
-----   Message from Sueanne Margarita, MD sent at 02/02/2017  9:30 AM EDT ----- AHI too high - please get a 2 week autotitration from 4 to 18cm H2O

## 2017-02-02 NOTE — Telephone Encounter (Addendum)
Called the patient and gave her the sleep study results, she verbalized understanding and explained that she is sleepy most days 6 pm until bed. She sleeps well once in bed.  She understand Dr. Radford Pax has ordered a 2 week autotitation from 4 to 18cm H20.   She understands a 2 week autotitration has been ordered in epic.  [place] Notified of order

## 2017-02-02 NOTE — Telephone Encounter (Signed)
-----   Message from Sueanne Margarita, MD sent at 02/02/2017  9:30 AM EDT ----- AHI too high - please get a 2 week autotitration from 4 to 18cm H2O

## 2017-02-02 NOTE — Telephone Encounter (Signed)
Called the patient and gave her the sleep study results, she verbalized understanding and explained that she is sleepy most days 6 pm until bed. She sleeps well once in bed

## 2017-02-05 ENCOUNTER — Other Ambulatory Visit: Payer: Self-pay | Admitting: Gastroenterology

## 2017-02-09 ENCOUNTER — Ambulatory Visit: Payer: Medicare Other | Admitting: Podiatry

## 2017-02-19 ENCOUNTER — Other Ambulatory Visit: Payer: Self-pay | Admitting: Family Medicine

## 2017-02-23 ENCOUNTER — Telehealth: Payer: Self-pay | Admitting: Cardiology

## 2017-02-23 NOTE — Telephone Encounter (Signed)
Follow Up:    Please call,pt said she had not heard anything since she had her Sleep Study in December.

## 2017-02-26 ENCOUNTER — Other Ambulatory Visit: Payer: Self-pay | Admitting: Family Medicine

## 2017-02-26 NOTE — Telephone Encounter (Signed)
Last office visit 11/23/2016.  Last refilled 01/16/2017 for #60 with no refills.  Ok to refill?

## 2017-02-27 NOTE — Telephone Encounter (Signed)
Clonazepam called into [id number] - [place], [place] - [address] Phone: [phone] 

## 2017-03-01 ENCOUNTER — Telehealth: Payer: Self-pay | Admitting: *Deleted

## 2017-03-01 NOTE — Telephone Encounter (Signed)
Called the patient to follow up on her phone call and to address her concerns. Patient says she never got the results of her 2 week titration from December and she did not want to do another one not knowing the results from the previous one.  I called [facility] to ask about the titration and Ailene Ravel says it was faxed over but I didn't see it in epic. [place] faxed the titration over to me and it has been sent to scan. I called the patient and explained at our last conversation in March she complained of being sleepy most days and that's when Dr. Radford Pax ordered her a 2 week auto titration 4 to 18cm H20 which is a pressure change from the titration in November which was 5 to 18cm H20. Patient verbalized understanding and has agreed to the treatment plan. She understands [facility] will contact her about the pressure change. Message sent to [facility] about pressure change.

## 2017-03-09 ENCOUNTER — Other Ambulatory Visit: Payer: Self-pay

## 2017-03-09 ENCOUNTER — Telehealth: Payer: Self-pay

## 2017-03-09 MED ORDER — DEXLANSOPRAZOLE 30 MG PO CPDR: 30.0000 mg | DELAYED_RELEASE_CAPSULE | Freq: Every day | ORAL | 3 refills | Status: DC

## 2017-03-09 NOTE — Telephone Encounter (Signed)
Received [facility]ed [facility] from [facility] 30mg  of dexilant qd.  Pt has been seen within 1 year and per Dr A note pt can f/u prn for this. Med sent with 3 refills.

## 2017-03-12 ENCOUNTER — Telehealth: Payer: Self-pay | Admitting: *Deleted

## 2017-03-12 DIAGNOSIS — G4733 Obstructive sleep apnea (adult) (pediatric): Secondary | ICD-10-CM

## 2017-03-12 NOTE — Telephone Encounter (Signed)
Gave patient recommended pressure changes made by Dr. Radford Pax and  understanding was verbalized. Patient understands an order has been placed in epic to set Cpap at 14cm H20 and repeat download in 2 weeks. Patient understands [place] will contact her about the pressure change. [facility] has been notified Patient agrees with the treatment plan and thanked me for calling.

## 2017-03-21 ENCOUNTER — Other Ambulatory Visit: Payer: Self-pay | Admitting: Family Medicine

## 2017-03-21 ENCOUNTER — Encounter: Payer: Self-pay | Admitting: Emergency Medicine

## 2017-03-21 ENCOUNTER — Emergency Department: Payer: Medicare Other

## 2017-03-21 ENCOUNTER — Emergency Department
Admission: EM | Admit: 2017-03-21 | Discharge: 2017-03-21 | Disposition: A | Discharge status: Home / Self Care | Payer: Medicare Other | Attending: Emergency Medicine | Admitting: Emergency Medicine

## 2017-03-21 ENCOUNTER — Telehealth: Payer: Self-pay | Admitting: Family Medicine

## 2017-03-21 DIAGNOSIS — E119 Type 2 diabetes mellitus without complications: Secondary | ICD-10-CM | POA: Insufficient documentation

## 2017-03-21 DIAGNOSIS — Z79899 Other long term (current) drug therapy: Secondary | ICD-10-CM | POA: Insufficient documentation

## 2017-03-21 DIAGNOSIS — Y929 Unspecified place or not applicable: Secondary | ICD-10-CM | POA: Insufficient documentation

## 2017-03-21 DIAGNOSIS — Y939 Activity, unspecified: Secondary | ICD-10-CM | POA: Insufficient documentation

## 2017-03-21 DIAGNOSIS — W1800XA Striking against unspecified object with subsequent fall, initial encounter: Secondary | ICD-10-CM | POA: Insufficient documentation

## 2017-03-21 DIAGNOSIS — Y999 Unspecified external cause status: Secondary | ICD-10-CM | POA: Insufficient documentation

## 2017-03-21 DIAGNOSIS — R55 Syncope and collapse: Secondary | ICD-10-CM | POA: Insufficient documentation

## 2017-03-21 DIAGNOSIS — I10 Essential (primary) hypertension: Secondary | ICD-10-CM | POA: Insufficient documentation

## 2017-03-21 DIAGNOSIS — I2510 Atherosclerotic heart disease of native coronary artery without angina pectoris: Secondary | ICD-10-CM | POA: Diagnosis not present

## 2017-03-21 DIAGNOSIS — S0990XA Unspecified injury of head, initial encounter: Secondary | ICD-10-CM

## 2017-03-21 DIAGNOSIS — Z7982 Long term (current) use of aspirin: Secondary | ICD-10-CM | POA: Insufficient documentation

## 2017-03-21 LAB — BASIC METABOLIC PANEL
ANION GAP: 7 (ref 5–15)
BUN: 19 mg/dL (ref 6–20)
CALCIUM: 9.4 mg/dL (ref 8.9–10.3)
CHLORIDE: 101 mmol/L (ref 101–111)
CO2: 31 mmol/L (ref 22–32)
Creatinine, Ser: 0.77 mg/dL (ref 0.44–1.00)
GFR calc non Af Amer: >60 mL/min (ref >60)
Glucose, Bld: 89 mg/dL (ref 65–99)
Potassium: 3.9 mmol/L (ref 3.5–5.1)
SODIUM: 139 mmol/L (ref 135–145)

## 2017-03-21 LAB — CBC
HCT: 36 % (ref 35.0–47.0)
HEMOGLOBIN: 12.1 g/dL (ref 12.0–16.0)
MCH: 29.9 pg (ref 26.0–34.0)
MCHC: 33.7 g/dL (ref 32.0–36.0)
MCV: 88.5 fL (ref 80.0–100.0)
Platelets: 286 10*3/uL (ref 150–440)
RBC: 4.06 MIL/uL (ref 3.80–5.20)
RDW: 13.3 % (ref 11.5–14.5)
WBC: 6.6 10*3/uL (ref 3.6–11.0)

## 2017-03-21 NOTE — ED Provider Notes (Signed)
[facility] Emergency Department Provider Note       Time seen: ----------------------------------------- 1:51 PM on 03/21/2017 -----------------------------------------     I have reviewed the triage vital signs and the nursing notes.   HISTORY   Chief Complaint Fall and Loss of Consciousness    HPI Morgan [name] is a 76 y.o. female who presents to the ED for a syncopal event that occurred yesterday where she fell and struck her head. Patient takes aspirin daily as scribe mild nausea and confusion today. She presents neurologically intact. She is concerned she has had an intracranial hemorrhage because she has had one in the past. Patient does not know why she passed out. She reports that when she fell during the passing out event she woke up when she hit her head. She denies other complaints at this time    Past Medical History:  Diagnosis Date  . Angina   . Arthritis   . Atrial fibrillation (HCC)    ASPIRIN FOR BLOOD THINNER  . Atypical mole 09/22/2016  . Bulging discs    lumbar   . Complication of anesthesia    pt states has choking sensation with ET tube   . Coronary artery disease   . Depression   . Diabetes mellitus    diet controlled/on meds  . Fibromyalgia   . GERD (gastroesophageal reflux disease)   . H/O hiatal hernia   . Headache(784.0)    "recurring"  . High cholesterol   . Hyperlipidemia   . Hypertension   . 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 Migraines    "til ~ 1980"  . PONV (postoperative nausea and vomiting)   . Restless leg syndrome   . Sciatic nerve pain    "from pinched nerve"  . Sleep apnea    uses CPAP  . Weakness of right side of body    "I've had PT for it; they don't know what it's from".  CORTISONE INJECTION INTO BACK 08/30/12    Patient Active Problem List   Diagnosis Date Noted  . Malignant melanoma ([place]) 11/23/2016  . Urge incontinence 11/23/2016  . Atypical mole 09/22/2016  . Slurring of speech 05/12/2016   . Traumatic arthritis elbow 05/04/2015  . Left foot pain 12/31/2014  . Severe sprain of left ankle 12/31/2014  . Osteopenia 09/15/2014  . History of TIA (transient ischemic attack) 05/19/2014  . Fracture of coronoid process of ulna, left, closed 04/25/2014  . Ingrown toenail 01/15/2014  . Obesity (BMI 30-39.9) 10/08/2013  . OA (osteoarthritis) of knee 07/07/2013  . Chronic low back pain 05/09/2013  . Obstructive sleep apnea 05/09/2013  . GERD (gastroesophageal reflux disease) 05/09/2013  . Hiatal hernia 05/09/2013  . Fibromyalgia syndrome 05/09/2013  . Depression, major, in partial remission ([place]) 05/09/2013  . RLS (restless legs syndrome) 05/09/2013  . Diabetes mellitus with no complication ([place]) 16/08/9603  . High cholesterol 05/09/2013  . 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 Family history of colon cancer 05/09/2013  . Allergic rhinitis 05/09/2013  . Lateral meniscal tear 09/04/2012  . CAD (coronary artery disease) 09/27/2011  . HTN (hypertension) 09/27/2011  . Paroxysmal A-fib ([place]) 09/27/2011    Past Surgical History:  Procedure Laterality Date  . [place] STUDY N/A 12/29/2015   Procedure: [place] STUDY;  Surgeon: Manus Gunning, MD;  Location: [facility];  Service: Gastroenterology;  Laterality: N/A;  . CARPAL TUNNEL RELEASE Left 07/02/2015   Procedure: CARPAL TUNNEL RELEASE;  Surgeon: Frederik Pear, MD;  Location: [facility];  Service: Orthopedics;  Laterality: Left;  . CATARACT EXTRACTION, BILATERAL Bilateral    Oct and Nov 2017  . CORONARY ANGIOPLASTY WITH STENT PLACEMENT  06/2011   "1"  . CORONARY ARTERY BYPASS GRAFT  2005   CABG X 2  . DILATION AND CURETTAGE OF UTERUS     "more than once"  . ELBOW ARTHROSCOPY Left 07/02/2015   Procedure: ARTHROSCOPY LEFT ELBOW WITH DEBRIDEMENT AND REMOVAL LOOSE BODY;  Surgeon: Frederik Pear, MD;  Location: [place];  Service: Orthopedics;  Laterality: Left;  . ESOPHAGEAL MANOMETRY N/A  12/29/2015   Procedure: ESOPHAGEAL MANOMETRY (EM);  Surgeon: Manus Gunning, MD;  Location: [facility];  Service: Gastroenterology;  Laterality: N/A;  . FRACTURE SURGERY  ~ 2005   nose  . KNEE ARTHROSCOPY  09/04/2012   Procedure: ARTHROSCOPY KNEE;  Surgeon: Gearlean Alf, MD;  Location: [facility];  Service: Orthopedics;  Laterality: Right;  right knee arthroscopy with medial and lateral meniscus debridement  . 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 MOUTH SURGERY  2004   "bone replacement; had cadavear bones put in; face was collapsing"  . NASAL SEPTUM SURGERY  ~ 1986  . TOTAL KNEE ARTHROPLASTY Right 07/07/2013   Procedure: RIGHT TOTAL KNEE ARTHROPLASTY;  Surgeon: Gearlean Alf, MD;  Location: [facility];  Service: Orthopedics;  Laterality: Right;    Allergies Hydrocodone-acetaminophen; Percocet [oxycodone-acetaminophen]; and Sulfa antibiotics  Social History Social History  Substance Use Topics  . Smoking status: Never Smoker  . Smokeless tobacco: Never Used  . Alcohol use 0.6 oz/week    1 Glasses of wine per week     Comment: "occasionally drink wine"    Review of Systems Constitutional: Negative for fever. Eyes: Negative for vision changes ENT:  Negative for congestion, sore throat Cardiovascular: Negative for chest pain. Respiratory: Negative for shortness of breath. Gastrointestinal: Negative for abdominal pain, vomiting and diarrhea. Genitourinary: Negative for dysuria. Musculoskeletal: Negative for back pain. Skin: Negative for rash. Neurological: Positive for headache  All systems negative/normal/unremarkable except as stated in the HPI  ____________________________________________   PHYSICAL EXAM:  VITAL SIGNS: ED Triage Vitals  Enc Vitals Group     BP 03/21/17 1126 (!) 153/74     Pulse Rate 03/21/17 1126 70     Resp 03/21/17 1126 18     Temp 03/21/17 1126 98.4 F (36.9 C)     Temp Source 03/21/17 1126 Oral     SpO2 03/21/17  1126 99 %     Weight 03/21/17 1126 155 lb (70.3 kg)     Height 03/21/17 1126 5\' 1"  (1.549 m)     Head Circumference --      Peak Flow --      Pain Score 03/21/17 1125 7     Pain Loc --      Pain Edu? --      Excl. in [place]? --     Constitutional: Alert and oriented. Well appearing and in no distress. Eyes: Conjunctivae are normal. PERRL. Normal extraocular movements. ENT   Head: Normocephalic and atraumatic.   Nose: No congestion/rhinnorhea.   Mouth/Throat: Mucous membranes are moist.   Neck: No stridor. Cardiovascular: Normal rate, regular rhythm. No murmurs, rubs, or gallops. Respiratory: Normal respiratory effort without tachypnea nor retractions. Breath sounds are clear and equal bilaterally. No wheezes/rales/rhonchi. Gastrointestinal: Soft and nontender. Normal bowel sounds Musculoskeletal: Nontender with normal range of motion in extremities. No lower extremity tenderness nor edema. Neurologic:  Normal speech and language. No gross focal neurologic deficits are appreciated.  Skin:  Skin is warm, dry and intact. No rash noted. Psychiatric: Mood and affect are normal. Speech and behavior are normal.  ____________________________________________  EKG: Interpreted by me. Sinus rhythm with a rate of 60 bpm, normal PR, normal QRS, normal QT  ____________________________________________  ED COURSE:  Pertinent labs & imaging results that were available during my care of the patient were reviewed by me and considered in my medical decision making (see chart for details). Patient presents for syncope and head injury, we will assess with labs and imaging as indicated.   Procedures ____________________________________________   LABS (pertinent positives/negatives)  Labs Reviewed  BASIC METABOLIC PANEL  CBC  URINALYSIS, COMPLETE (UACMP) WITH MICROSCOPIC  CBG MONITORING, ED    RADIOLOGY  CT head was  unremarkable  ____________________________________________  FINAL ASSESSMENT AND PLAN  Syncope, minor head injury  Plan: Patient's labs and imaging were dictated above. Patient had presented for syncope and minor head injury. I offered admission but she has declined at this time. Unclear etiology for syncope, possible hypoglycemia. I advise stopping victoza and following up with her doctor as scheduled.   Earleen Newport, MD   Note: This note was generated in part or whole with voice recognition software. Voice recognition is usually quite accurate but there are transcription errors that can and very often do occur. I apologize for any typographical errors that were not detected and corrected.     Earleen Newport, MD 03/21/17 [phone]

## 2017-03-21 NOTE — ED Triage Notes (Signed)
Pt comes into the ED via POV c/o a fall she sustained yesterday where she hit her head and LOC.  Patient takes 325 aspirin a day and has described mild nausea and confusion today.  Patient is neurologically intact at this time and in NAD with even and unlabored respirations and even gait into the triage room.

## 2017-03-21 NOTE — ED Notes (Signed)
NAD noted at time of D/C. Pt denies questions or concerns. Pt ambulatory to the lobby at this time. Pt refused wheelchair to the lobby.  

## 2017-03-21 NOTE — Telephone Encounter (Signed)
Per chart review tab pt is at [facility] ED. 

## 2017-03-21 NOTE — Telephone Encounter (Signed)
Patient Name: Morgan [name]  DOB: [birth year]    Initial Comment Caller states that she feel yesterday, and she has swelling behind ear and up to her head, and she has an headache. She hit her head.    Nurse Assessment  Nurse: Julien Girt, RN, [facility] Date/Time Eilene Ghazi Time): 03/21/2017 10:14:39 AM  Confirm and document reason for call. If symptomatic, describe symptoms. ---Caller states that she fell yesterday and hit her head. She was waking up, does not remember falling but knows she hit the door. This morning she still has swelling behind left ear and into her scalp that is tender to touch. She does have a headache.  Does the patient have any new or worsening symptoms? ---Yes  Will a triage be completed? ---Yes  Related visit to physician within the last 2 weeks? ---No  Does the PT have any chronic conditions? (i.e. diabetes, asthma, etc.) ---Yes  List chronic conditions. ---TKA right, Htn, Type 2 diabetic  Is this a behavioral health or substance abuse call? ---No     Guidelines    Guideline Title Affirmed Question Affirmed Notes  Head Injury Can't remember what happened (amnesia)    Final Disposition User   Go to ED Now Julien Girt, RN, [facility] - ED   Disagree/Comply: Comply

## 2017-03-27 ENCOUNTER — Encounter: Payer: Self-pay | Admitting: Cardiology

## 2017-03-30 ENCOUNTER — Telehealth: Payer: Self-pay | Admitting: *Deleted

## 2017-03-30 NOTE — Telephone Encounter (Signed)
No Dr Radford Pax the download was not on her new nasal mask. It was for a pressure change to Set CPAP at 14cm H2O and repeat download in 2 weeks. ------  Notes recorded by Sueanne Margarita, MD on 03/29/2017 at 12:56 PM EDT Please find out if the D/L was on new nasal mask

## 2017-04-02 NOTE — Telephone Encounter (Signed)
Has she gotten her new mask yet

## 2017-04-06 ENCOUNTER — Other Ambulatory Visit: Payer: Self-pay | Admitting: Gastroenterology

## 2017-04-06 NOTE — Telephone Encounter (Signed)
Yes, patient has received her new mask. Download sent to scan

## 2017-04-09 ENCOUNTER — Other Ambulatory Visit: Payer: Self-pay | Admitting: Cardiology

## 2017-04-10 ENCOUNTER — Other Ambulatory Visit: Payer: Self-pay | Admitting: Family Medicine

## 2017-04-10 NOTE — Telephone Encounter (Signed)
Last office visit 11/23/2016.  Last refilled Gabapentin 09/22/2016 for #180 with 1 refill.  Clonazepam 02/27/2017 for #60 with no refills.  Ok to refill?

## 2017-04-11 NOTE — Telephone Encounter (Signed)
[id number] [place], [place] - [address] Phone: [phone] 

## 2017-04-11 NOTE — Telephone Encounter (Signed)
Informed patient of download results and patient understanding was verbalized. Patient understands there are no changes being made today. Patient was grateful for the call and thanked me.

## 2017-04-11 NOTE — Telephone Encounter (Signed)
-----   Message from Sueanne Margarita, MD sent at 04/10/2017  7:30 PM EDT ----- Good AHI and compliance.  Continue current CPAP settings.

## 2017-05-21 ENCOUNTER — Other Ambulatory Visit: Payer: Self-pay | Admitting: Family Medicine

## 2017-05-21 ENCOUNTER — Telehealth: Payer: Self-pay | Admitting: Family Medicine

## 2017-05-21 ENCOUNTER — Other Ambulatory Visit (INDEPENDENT_AMBULATORY_CARE_PROVIDER_SITE_OTHER): Payer: Medicare Other

## 2017-05-21 DIAGNOSIS — E119 Type 2 diabetes mellitus without complications: Secondary | ICD-10-CM

## 2017-05-21 LAB — COMPREHENSIVE METABOLIC PANEL
ALK PHOS: 67 U/L (ref 39–117)
ALT: 18 U/L (ref 0–35)
AST: 17 U/L (ref 0–37)
Albumin: 3.9 g/dL (ref 3.5–5.2)
BILIRUBIN TOTAL: 0.4 mg/dL (ref 0.2–1.2)
BUN: 22 mg/dL (ref 6–23)
CALCIUM: 9.5 mg/dL (ref 8.4–10.5)
CO2: 35 mEq/L — ABNORMAL HIGH (ref 19–32)
CREATININE: 0.97 mg/dL (ref 0.40–1.20)
Chloride: 100 mEq/L (ref 96–112)
GFR: 59.48 mL/min — AB (ref >60.00)
Glucose, Bld: 78 mg/dL (ref 70–99)
Potassium: 4.1 mEq/L (ref 3.5–5.1)
Sodium: 141 mEq/L (ref 135–145)
TOTAL PROTEIN: 6.5 g/dL (ref 6.0–8.3)

## 2017-05-21 LAB — LIPID PANEL
CHOLESTEROL: 117 mg/dL (ref 0–200)
HDL: 48.1 mg/dL (ref >39.00)
LDL Cholesterol: 53 mg/dL (ref 0–99)
NonHDL: 68.48
TRIGLYCERIDES: 78 mg/dL (ref 0.0–149.0)
Total CHOL/HDL Ratio: 2
VLDL: 15.6 mg/dL (ref 0.0–40.0)

## 2017-05-21 LAB — HEMOGLOBIN A1C: HEMOGLOBIN A1C: 6.3 % (ref 4.6–6.5)

## 2017-05-21 NOTE — Telephone Encounter (Signed)
Last office visit 11/23/2016.  Last refilled 04/10/2017 for #60 with no refills.  Ok to refill?

## 2017-05-21 NOTE — Telephone Encounter (Signed)
-----   Message from Ellamae Sia sent at 05/18/2017  9:10 AM EDT ----- Regarding: Lab orders for Monday, 7.9.18 Lab orders for a DM appt

## 2017-05-22 NOTE — Telephone Encounter (Signed)
[facility]am [id number] [place], [place] - [address] Phone: [phone] 

## 2017-05-24 ENCOUNTER — Ambulatory Visit (INDEPENDENT_AMBULATORY_CARE_PROVIDER_SITE_OTHER): Payer: Medicare Other | Admitting: Family Medicine

## 2017-05-24 ENCOUNTER — Encounter: Payer: Self-pay | Admitting: Family Medicine

## 2017-05-24 VITALS — BP 126/61 | HR 73 | Temp 97.8°F | Ht 62.5 in | Wt 151.0 lb

## 2017-05-24 DIAGNOSIS — E7800 Pure hypercholesterolemia, unspecified: Secondary | ICD-10-CM | POA: Diagnosis not present

## 2017-05-24 DIAGNOSIS — I10 Essential (primary) hypertension: Secondary | ICD-10-CM | POA: Diagnosis not present

## 2017-05-24 DIAGNOSIS — E119 Type 2 diabetes mellitus without complications: Secondary | ICD-10-CM | POA: Diagnosis not present

## 2017-05-24 LAB — HM DIABETES FOOT EXAM

## 2017-05-24 NOTE — Progress Notes (Signed)
Subjective:    Patient ID: Morgan [name], female    DOB: 11-14-1940, 76 y.o.   MRN: [id number]  HPI    76 year old female presents for follow up DM.  Diabetes:   Excellent control on victoza and metformin Lab Results  Component Value Date   HGBA1C 6.3 05/21/2017  Using medications without difficulties: Hypoglycemic episodes: rare in 60s Hyperglycemic episodes: none Feet problems: none Blood Sugars averaging: FBS 95-100 Wt Readings from Last 3 Encounters:  05/24/17 151 lb (68.5 kg)  03/21/17 155 lb (70.3 kg)  11/23/16 154 lb 12 oz (70.2 kg)  eye exam within last year: yes   Body mass index is 27.18 kg/m.    Elevated Cholesterol: LDL at goal < 70 with history of CVA on lipitor 80 mg daily. Lab Results  Component Value Date   CHOL 117 05/21/2017   HDL 48.10 05/21/2017   LDLCALC 53 05/21/2017   TRIG 78.0 05/21/2017   CHOLHDL 2 05/21/2017  Using medications without problems: none Muscle aches:  none Diet compliance: good Exercise: none Other complaints:;  Hypertension:    Good control on HCTZ and metoprolol. HX of CAD BP Readings from Last 3 Encounters:  05/24/17 126/61  03/21/17 (!) 157/62  11/23/16 120/64  Using medication without problems or lightheadedness:  none Chest pain with exertion: none Edema: none Short of breath: occ with exertion Average home BPs: good control when checking Other issues:   Review of Systems  Constitutional: Negative for fatigue and fever.  HENT: Negative for ear pain.   Eyes: Negative for pain.  Respiratory: Negative for chest tightness.   Cardiovascular: Negative for chest pain, palpitations and leg swelling.  Gastrointestinal: Negative for abdominal pain.  Genitourinary: Negative for dysuria.       Objective:   Physical Exam  Constitutional: Vital signs are normal. She appears well-developed and well-nourished. She is cooperative.  Non-toxic appearance. She does not appear ill. No distress.  HENT:  Head:  Normocephalic.  Right Ear: Hearing, tympanic membrane, external ear and ear canal normal. Tympanic membrane is not erythematous, not retracted and not bulging.  Left Ear: Hearing, tympanic membrane, external ear and ear canal normal. Tympanic membrane is not erythematous, not retracted and not bulging.  Nose: No mucosal edema or rhinorrhea. Right sinus exhibits no maxillary sinus tenderness and no frontal sinus tenderness. Left sinus exhibits no maxillary sinus tenderness and no frontal sinus tenderness.  Mouth/Throat: Uvula is midline, oropharynx is clear and moist and mucous membranes are normal.  Eyes: Pupils are equal, round, and reactive to light. Conjunctivae, EOM and lids are normal. Lids are everted and swept, no foreign bodies found.  Neck: Trachea normal and normal range of motion. Neck supple. Carotid bruit is not present. No thyroid mass and no thyromegaly present.  Cardiovascular: Normal rate, regular rhythm, S1 normal, S2 normal, normal heart sounds, intact distal pulses and normal pulses.  Exam reveals no gallop and no friction rub.   No murmur heard. Pulmonary/Chest: Effort normal and breath sounds normal. No tachypnea. No respiratory distress. She has no decreased breath sounds. She has no wheezes. She has no rhonchi. She has no rales.  Abdominal: Soft. Normal appearance and bowel sounds are normal. There is no tenderness.  Neurological: She is alert.  Skin: Skin is warm, dry and intact. No rash noted.  Psychiatric: Her speech is normal and behavior is normal. Judgment and thought content normal. Her mood appears not anxious. Cognition and memory are normal. She does not exhibit a  depressed mood.    Diabetic foot exam: Normal inspection No skin breakdown No calluses  Normal DP pulses Normal sensation to light touch and monofilament Nails normal       Assessment & Plan:

## 2017-05-24 NOTE — Assessment & Plan Note (Signed)
Occ lows but not common. She is hesitant about lowering meds.  If increasing in frequency we will consider decrease of metformin to 750 mg XR 2 tabs daily.

## 2017-05-24 NOTE — Assessment & Plan Note (Signed)
Well controlled. Continue current medication.  

## 2017-05-24 NOTE — Patient Instructions (Addendum)
Call if increase in amount of lower blood sugars  In 60s, for possible medications adjustment. Get back to exercise as able.

## 2017-05-24 NOTE — Assessment & Plan Note (Signed)
Good control on high dose statin.  HX of CVA and CAD.

## 2017-06-04 ENCOUNTER — Emergency Department
Admission: EM | Admit: 2017-06-04 | Discharge: 2017-06-04 | Disposition: A | Discharge status: Home / Self Care | Payer: Medicare Other | Attending: Emergency Medicine | Admitting: Emergency Medicine

## 2017-06-04 ENCOUNTER — Telehealth: Payer: Self-pay | Admitting: Family Medicine

## 2017-06-04 DIAGNOSIS — I2510 Atherosclerotic heart disease of native coronary artery without angina pectoris: Secondary | ICD-10-CM | POA: Diagnosis not present

## 2017-06-04 DIAGNOSIS — Z7982 Long term (current) use of aspirin: Secondary | ICD-10-CM | POA: Diagnosis not present

## 2017-06-04 DIAGNOSIS — Z951 Presence of aortocoronary bypass graft: Secondary | ICD-10-CM | POA: Insufficient documentation

## 2017-06-04 DIAGNOSIS — J040 Acute laryngitis: Secondary | ICD-10-CM

## 2017-06-04 DIAGNOSIS — Z79899 Other long term (current) drug therapy: Secondary | ICD-10-CM | POA: Diagnosis not present

## 2017-06-04 DIAGNOSIS — Z7984 Long term (current) use of oral hypoglycemic drugs: Secondary | ICD-10-CM | POA: Insufficient documentation

## 2017-06-04 DIAGNOSIS — Z8673 Personal history of transient ischemic attack (TIA), and cerebral infarction without residual deficits: Secondary | ICD-10-CM | POA: Diagnosis not present

## 2017-06-04 DIAGNOSIS — Z96651 Presence of right artificial knee joint: Secondary | ICD-10-CM | POA: Diagnosis not present

## 2017-06-04 DIAGNOSIS — E119 Type 2 diabetes mellitus without complications: Secondary | ICD-10-CM | POA: Insufficient documentation

## 2017-06-04 DIAGNOSIS — I10 Essential (primary) hypertension: Secondary | ICD-10-CM | POA: Diagnosis not present

## 2017-06-04 LAB — GLUCOSE, CAPILLARY: GLUCOSE-CAPILLARY: 119 mg/dL — AB (ref 65–99)

## 2017-06-04 MED ORDER — AMOXICILLIN 500 MG PO CAPS: 500.0000 mg | ORAL_CAPSULE | Freq: Two times a day (BID) | ORAL | 0 refills | Status: DC

## 2017-06-04 MED ORDER — PREDNISONE 10 MG PO TABS: ORAL_TABLET | ORAL | 0 refills | Status: DC

## 2017-06-04 NOTE — ED Triage Notes (Signed)
Pt in via POV with complaints of laryngitis x one week.  Pt voice is hoarse, denies any other complaints.  Pt states she is going out of town Wednesday and is just wanting to have it checked out.  NAD noted at this time.

## 2017-06-04 NOTE — ED Provider Notes (Signed)
[facility] Emergency Department Provider Note  ____________________________________________  Time seen: Approximately 12:09 PM  I have reviewed the triage vital signs and the nursing notes.   HISTORY  Chief Complaint Laryngitis    HPI Morgan [name] is a 76 y.o. female who presents to emergency department with laryngitis for one week. She wears a CPAP at night and noticed some green nasal drainage from her nose this morning. She denies any additional upper respiratory infections. This has not happened before. She does not smoke. No alleviating measures have been attempted. She is leaving for a vacation in [place] in 2 days. No history of cancer. She denies fever, nasal congestion, cough, shortness of breath, chest pain, nausea, vomiting, abdominal pain.   Past Medical History:  Diagnosis Date  . Angina   . Arthritis   . Atrial fibrillation (HCC)    ASPIRIN FOR BLOOD THINNER  . Atypical mole 09/22/2016  . Bulging discs    lumbar   . Complication of anesthesia    pt states has choking sensation with ET tube   . Coronary artery disease   . Depression   . Diabetes mellitus    diet controlled/on meds  . Fibromyalgia   . GERD (gastroesophageal reflux disease)   . H/O hiatal hernia   . Headache(784.0)    "recurring"  . High cholesterol   . Hyperlipidemia   . Hypertension   . 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 Migraines    "til ~ 1980"  . PONV (postoperative nausea and vomiting)   . Restless leg syndrome   . Sciatic nerve pain    "from pinched nerve"  . Sleep apnea    uses CPAP  . Weakness of right side of body    "I've had PT for it; they don't know what it's from".  CORTISONE INJECTION INTO BACK 08/30/12    Patient Active Problem List   Diagnosis Date Noted  . Malignant melanoma ([place]) 11/23/2016  . Urge incontinence 11/23/2016  . Atypical mole 09/22/2016  . Slurring of speech 05/12/2016  . Traumatic arthritis elbow 05/04/2015  . Left foot pain  12/31/2014  . Severe sprain of left ankle 12/31/2014  . Osteopenia 09/15/2014  . History of TIA (transient ischemic attack) 05/19/2014  . Fracture of coronoid process of ulna, left, closed 04/25/2014  . Ingrown toenail 01/15/2014  . Obesity (BMI 30-39.9) 10/08/2013  . OA (osteoarthritis) of knee 07/07/2013  . Chronic low back pain 05/09/2013  . Obstructive sleep apnea 05/09/2013  . GERD (gastroesophageal reflux disease) 05/09/2013  . Hiatal hernia 05/09/2013  . Fibromyalgia syndrome 05/09/2013  . Depression, major, in partial remission ([place]) 05/09/2013  . RLS (restless legs syndrome) 05/09/2013  . Diabetes mellitus with no complication ([place]) 56/38/7564  . High cholesterol 05/09/2013  . 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 Family history of colon cancer 05/09/2013  . Allergic rhinitis 05/09/2013  . Lateral meniscal tear 09/04/2012  . CAD (coronary artery disease) 09/27/2011  . HTN (hypertension) 09/27/2011  . Paroxysmal A-fib ([place]) 09/27/2011    Past Surgical History:  Procedure Laterality Date  . [place] STUDY N/A 12/29/2015   Procedure: [place] STUDY;  Surgeon: Manus Gunning, MD;  Location: [facility];  Service: Gastroenterology;  Laterality: N/A;  . CARPAL TUNNEL RELEASE Left 07/02/2015   Procedure: CARPAL TUNNEL RELEASE;  Surgeon: Frederik Pear, MD;  Location: [facility];  Service: Orthopedics;  Laterality: Left;  . CATARACT EXTRACTION, BILATERAL Bilateral    Oct and Nov 2017  .  CORONARY ANGIOPLASTY WITH STENT PLACEMENT  06/2011   "1"  . CORONARY ARTERY BYPASS GRAFT  2005   CABG X 2  . DILATION AND CURETTAGE OF UTERUS     "more than once"  . ELBOW ARTHROSCOPY Left 07/02/2015   Procedure: ARTHROSCOPY LEFT ELBOW WITH DEBRIDEMENT AND REMOVAL LOOSE BODY;  Surgeon: Frederik Pear, MD;  Location: [facility];  Service: Orthopedics;  Laterality: Left;  . ESOPHAGEAL MANOMETRY N/A 12/29/2015   Procedure: ESOPHAGEAL MANOMETRY (EM);  Surgeon:  Manus Gunning, MD;  [facility];  Service: Gastroenterology;  Laterality: N/A;  . FRACTURE SURGERY  ~ 2005   nose  . KNEE ARTHROSCOPY  09/04/2012   Procedure: ARTHROSCOPY KNEE;  Surgeon: Gearlean Alf, MD;  Location: [facility];  Service: Orthopedics;  Laterality: Right;  right knee arthroscopy with medial and lateral meniscus debridement  . 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 MOUTH SURGERY  2004   "bone replacement; had cadavear bones put in; face was collapsing"  . NASAL SEPTUM SURGERY  ~ 1986  . TOTAL KNEE ARTHROPLASTY Right 07/07/2013   Procedure: RIGHT TOTAL KNEE ARTHROPLASTY;  Surgeon: Gearlean Alf, MD;  Location: [facility];  Service: Orthopedics;  Laterality: Right;    Prior to Admission medications   Medication Sig Start Date End Date Taking? Authorizing Provider  amoxicillin (AMOXIL) 500 MG capsule Take 1 capsule (500 mg total) by mouth 2 (two) times daily. 06/04/17   Laban Emperor, PA-C  aspirin 325 MG tablet Take 325 mg by mouth daily.    [provider]  atorvastatin (LIPITOR) 80 MG tablet Take 1 tablet (80 mg total) by mouth daily. 04/11/17   Jerline Pain, MD  CARAFATE 1 GM/10ML suspension TAKE 10 MLS (1 G TOTAL) BY MOUTH EVERY 6 (SIX) HOURS. 04/06/17   Armbruster, Renelda Loma, MD  cetirizine (ZYRTEC) 10 MG tablet Take 10 mg by mouth daily. Reported on 12/23/2015    [provider]  clonazePAM (KLONOPIN) 1 MG tablet TAKE 2 TABLETS BY MOUTH AT BEDTIME 05/22/17   Bedsole, Amy E, MD  Dexlansoprazole (DEXILANT) 30 MG capsule Take 1 capsule (30 mg total) by mouth daily. 03/09/17   Armbruster, Renelda Loma, MD  gabapentin (NEURONTIN) 300 MG capsule TAKE 1 TO 2 CAPSULES BY MOUTH AT BEDTIME 04/10/17   Bedsole, Amy E, MD  hydrochlorothiazide (HYDRODIURIL) 25 MG tablet TAKE 1 TABLET (25 MG TOTAL) BY MOUTH DAILY. 11/24/16   Bedsole, Amy E, MD  meloxicam (MOBIC) 15 MG tablet Take 15 mg by mouth daily. Reported on 12/23/2015 08/31/15    [provider]  metformin (FORTAMET) 1000 MG (OSM) 24 hr tablet Take 1 tablet (1,000 mg total) by mouth 2 (two) times daily with a meal. 08/15/16   Bedsole, Amy E, MD  metoprolol succinate (TOPROL-XL) 100 MG 24 hr tablet TAKE 1 TABLET (100 MG TOTAL) BY MOUTH DAILY. TAKE WITH OR IMMEDIATELY FOLLOWING A MEAL. 02/19/17   Bedsole, Amy E, MD  mirabegron ER (MYRBETRIQ) 25 MG TB24 tablet Take 1 tablet (25 mg total) by mouth daily. 11/23/16   Bedsole, Amy E, MD  Multiple Vitamins-Minerals (PRESERVISION AREDS PO) Take 1 tablet by mouth 2 (two) times daily. Reported on 12/23/2015    [provider]  nitroGLYCERIN (NITROSTAT) 0.4 MG SL tablet Place 0.4 mg under the tongue every 5 (five) minutes as needed for chest pain. Reported on 05/09/2016    [provider]  NOVOFINE 32G X 6 MM MISC USE  TO INJECT VICTOZA ONE TIME DAILY. DX: E11.9 05/02/16   Bedsole, Mervyn Gay, MD  ONE TOUCH ULTRA TEST test strip CHECK BLOOD SUGAR TWICE A DAY 12/12/15   Bedsole, Amy E, MD  [facility] DELICA LANCETS 44B MISC Use to check blood sugar once daily.  Dx? E11.9 04/23/16   Jinny Sanders, MD  predniSONE (DELTASONE) 10 MG tablet Take 6 tablets on day 1, take 5 tablets on day 2, take 4 tablets on day 3, take 3 tablets on day 4, take 2 tablets on day 5, take 1 tablet on day 6 06/04/17   Laban Emperor, PA-C  Probiotic Product (PROBIOTIC DAILY PO) Take 1 tablet by mouth daily.     [provider]  venlafaxine XR (EFFEXOR-XR) 37.5 MG 24 hr capsule TAKE 1 CAPSULE (37.5 MG TOTAL) BY MOUTH 2 (TWO) TIMES DAILY. 03/21/17   Bedsole, Amy E, MD  VICTOZA 18 MG/3ML SOPN INJECT 1.8 MG INTO THE SKIN DAILY. 12/09/16   Jinny Sanders, MD    Allergies Hydrocodone-acetaminophen; Percocet [oxycodone-acetaminophen]; and Sulfa antibiotics  Family History  Problem Relation Age of Onset  . Cancer Mother 31       breast cancer  . Breast cancer Mother   . Heart disease Father 57       sudden onset due to CAD  . Cancer Sister 30        colon  . Hypertension Sister   . Dementia Sister   . Diabetes Sister   . Colon cancer Sister   . GER disease Daughter   . Hypertension Daughter   . Heart disease Brother   . Hypertension Brother   . Heart attack Neg Hx   . Stroke Neg Hx     Social History Social History  Substance Use Topics  . Smoking status: Never Smoker  . Smokeless tobacco: Never Used  . Alcohol use 0.6 oz/week    1 Glasses of wine per week     Comment: "occasionally drink wine"     Review of Systems  Constitutional: No fever/chills Eyes: No visual changes. No discharge. Cardiovascular: No chest pain. Respiratory: Negative for cough. No SOB. Gastrointestinal: No abdominal pain.  No nausea, no vomiting.  No diarrhea.  No constipation. Musculoskeletal: Negative for musculoskeletal pain. Skin: Negative for rash, abrasions, lacerations, ecchymosis. Neurological: Negative for headaches.   ____________________________________________   PHYSICAL EXAM:  VITAL SIGNS: ED Triage Vitals  Enc Vitals Group     BP 06/04/17 1112 140/66     Pulse Rate 06/04/17 1112 72     Resp 06/04/17 1112 20     Temp 06/04/17 1112 97.8 F (36.6 C)     Temp Source 06/04/17 1112 Oral     SpO2 06/04/17 1112 98 %     Weight 06/04/17 1113 150 lb (68 kg)     Height 06/04/17 1113 5\' 1"  (1.549 m)     Head Circumference --      Peak Flow --      Pain Score --      Pain Loc --      Pain Edu? --      Excl. in [place]? --      Constitutional: Alert and oriented. Well appearing and in no acute distress. Hoarse voice. Eyes: Conjunctivae are normal. PERRL. EOMI. No discharge. Head: Atraumatic. ENT: No frontal and maxillary sinus tenderness.      Ears: Tympanic membranes pearly gray with good landmarks. No discharge.      Nose: No congestion/rhinnorhea.  Mouth/Throat: Mucous membranes are moist. Oropharynx non-erythematous. Tonsils not enlarged. No exudates. Uvula midline. Neck: No stridor.    Hematological/Lymphatic/Immunilogical: No cervical lymphadenopathy. Cardiovascular: Normal rate, regular rhythm.  Good peripheral circulation. Respiratory: Normal respiratory effort without tachypnea or retractions. Lungs CTAB. Good air entry to the bases with no decreased or absent breath sounds. Gastrointestinal: Bowel sounds 4 quadrants. Soft and nontender to palpation. No guarding or rigidity. No palpable masses. No distention. Musculoskeletal: Full range of motion to all extremities. No gross deformities appreciated. Neurologic:  Normal speech and language. No gross focal neurologic deficits are appreciated.  Skin:  Skin is warm, dry and intact. No rash noted. Psychiatric: Mood and affect are normal. Speech and behavior are normal. Patient exhibits appropriate insight and judgement.   ____________________________________________   LABS (all labs ordered are listed, but only abnormal results are displayed)  Labs Reviewed  GLUCOSE, CAPILLARY - Abnormal; Notable for the following:       Result Value   Glucose-Capillary 119 (*)    All other components within normal limits  CBG MONITORING, ED   ____________________________________________  EKG   ____________________________________________  RADIOLOGY No results found.  ____________________________________________    PROCEDURES  Procedure(s) performed:    Procedures    Medications - No data to display   ____________________________________________   INITIAL IMPRESSION / ASSESSMENT AND PLAN / ED COURSE  Pertinent labs & imaging results that were available during my care of the patient were reviewed by me and considered in my medical decision making (see chart for details).  Review of the [place] CSRS was performed in accordance of the [place] prior to dispensing any controlled drugs.   Patient's diagnosis is consistent with laryngitis. Vital signs and exam are reassuring. This is likely viral but since patient is  leaving on vacation on Wednesday, I will cover for bacterial causes. Patient feels comfortable going home. Patient will be discharged home with prescriptions for prednisone and amoxicillin. Patient is to follow up with PCP and otolaryngology as needed or otherwise directed. Patient is given ED precautions to return to the ED for any worsening or new symptoms.     ____________________________________________  FINAL CLINICAL IMPRESSION(S) / ED DIAGNOSES  Final diagnoses:  Laryngitis      NEW MEDICATIONS STARTED DURING THIS VISIT:  Discharge Medication List as of 06/04/2017 12:49 PM    START taking these medications   Details  amoxicillin (AMOXIL) 500 MG capsule Take 1 capsule (500 mg total) by mouth 2 (two) times daily., Starting Mon 06/04/2017, Print    predniSONE (DELTASONE) 10 MG tablet Take 6 tablets on day 1, take 5 tablets on day 2, take 4 tablets on day 3, take 3 tablets on day 4, take 2 tablets on day 5, take 1 tablet on day 6, Print            This chart was dictated using voice recognition software/Dragon. Despite best efforts to proofread, errors can occur which can change the meaning. Any change was purely unintentional.    Laban Emperor, PA-C 06/04/17 1522    Nena Polio, MD 06/04/17 [phone]

## 2017-06-04 NOTE — Telephone Encounter (Signed)
[facility]  Patient Name: Morgan [name]  DOB: 05/11/1941    Initial Comment wife laryngitis for a week, not getting better   Nurse Assessment  Nurse: Hervey Ard, RN, [place] Date/Time (Eastern Time): 06/04/2017 9:57:08 AM  Confirm and document reason for call. If symptomatic, describe symptoms. ---Caller states that wife has had laryngitis for a week. No sore throat, no fever. Short of breath and some congestion.  Does the patient have any new or worsening symptoms? ---Yes  Will a triage be completed? ---Yes  Related visit to physician within the last 2 weeks? ---No  Does the PT have any chronic conditions? (i.e. diabetes, asthma, etc.) ---Yes  List chronic conditions. ---Diabetes  Is this a behavioral health or substance abuse call? ---No     Guidelines    Guideline Title Affirmed Question Affirmed Notes  Hoarseness Difficulty breathing    Final Disposition User   Go to ED Now (or PCP triage) Hervey Ard, RN, [facility] - ED   Disagree/Comply: Comply

## 2017-06-04 NOTE — ED Notes (Signed)
See triage note.presents with hoarseness for about 1 week   Denies any sore throat   Fever or other sxs'

## 2017-06-05 ENCOUNTER — Ambulatory Visit: Payer: Medicare Other | Admitting: Family Medicine

## 2017-06-26 ENCOUNTER — Other Ambulatory Visit: Payer: Self-pay | Admitting: Family Medicine

## 2017-07-02 ENCOUNTER — Other Ambulatory Visit: Payer: Self-pay | Admitting: Family Medicine

## 2017-07-02 NOTE — Telephone Encounter (Signed)
Last office visit 05/24/2017.  Last refilled 05/22/2017 for #60 with no refills.  Ok to refill?

## 2017-07-03 NOTE — Telephone Encounter (Signed)
Clonazepam called into [id number] - [place], [place] - [address] Phone: [phone] 

## 2017-08-08 ENCOUNTER — Other Ambulatory Visit: Payer: Self-pay | Admitting: Gastroenterology

## 2017-08-08 ENCOUNTER — Telehealth: Payer: Self-pay

## 2017-08-08 NOTE — Telephone Encounter (Signed)
Called and spoke to pt. Told her we would refill her Carafate refill request and that Dr. Havery Moros would like for her to make follow up in the office.  She was agreeable and had been planning on calling to schedule.  We scheduled an appt in November 2018.  Refilled Carafate.

## 2017-08-08 NOTE — Telephone Encounter (Signed)
Yes we can refill it, she can use it PRN. I can see her for a routine follow up in the upcoming months if she is willing. Thanks

## 2017-08-08 NOTE — Telephone Encounter (Signed)
Ok to refill Carafate 1 gm/34ml? 442ml with 3 refills? Patient last seen in the office 04/2016.  Prescription last refilled 04-06-17.

## 2017-08-10 ENCOUNTER — Other Ambulatory Visit: Payer: Self-pay | Admitting: Family Medicine

## 2017-08-10 NOTE — Telephone Encounter (Signed)
Clonazepam called into [id number] - [place], [place] - [address] Phone: [phone] 

## 2017-08-10 NOTE — Telephone Encounter (Signed)
Last office visit 05/24/2017.  Last refilled 07/03/2017 for #60 with no refills.  Ok to refill?

## 2017-08-17 ENCOUNTER — Other Ambulatory Visit: Payer: Self-pay | Admitting: Family Medicine

## 2017-09-04 ENCOUNTER — Encounter: Payer: Self-pay | Admitting: Cardiology

## 2017-09-14 ENCOUNTER — Other Ambulatory Visit: Payer: Self-pay | Admitting: Family Medicine

## 2017-09-17 ENCOUNTER — Encounter: Payer: Self-pay | Admitting: Cardiology

## 2017-09-19 ENCOUNTER — Encounter: Payer: Self-pay | Admitting: Cardiology

## 2017-09-19 ENCOUNTER — Ambulatory Visit: Payer: Medicare Other | Admitting: Cardiology

## 2017-09-19 VITALS — BP 128/82 | HR 78 | Ht 63.0 in | Wt 154.6 lb

## 2017-09-19 DIAGNOSIS — G4733 Obstructive sleep apnea (adult) (pediatric): Secondary | ICD-10-CM | POA: Diagnosis not present

## 2017-09-19 DIAGNOSIS — I10 Essential (primary) hypertension: Secondary | ICD-10-CM | POA: Diagnosis not present

## 2017-09-19 NOTE — Progress Notes (Signed)
Cardiology Office Note:    Date:  09/19/2017   ID:  Morgan [name], DOB Jan 13, 1941, MRN [id number]  PCP:  Jinny Sanders, MD  Cardiologist:  Fransico Him, MD   Referring MD: Jinny Sanders, MD   Chief Complaint  Patient presents with  . Sleep Apnea  . Hypertension    History of Present Illness:    Morgan [name] is a 76 y.o. female with a hx of OSA, HTN and obesity.  She is doing well with her CPAP device.  She tolerates the full mask but has a lot of marks on her nose that are uncomfortable.  She has a nasal pillow mask but dose not know how to hook it up to her machine.  She feels the pressure is adequate.  Since going on CPAP she feels rested in the am and has no significant daytime sleepiness.  She has dry mouth and uses Biotene.  She does not think that he snores.  She sleeps well at night.     Past Medical History:  Diagnosis Date  . Angina   . Arthritis   . Atrial fibrillation (HCC)    ASPIRIN FOR BLOOD THINNER  . Atypical mole 09/22/2016  . Bulging discs    lumbar   . Complication of anesthesia    pt states has choking sensation with ET tube   . Coronary artery disease   . Depression   . Diabetes mellitus    diet controlled/on meds  . Fibromyalgia   . GERD (gastroesophageal reflux disease)   . H/O hiatal hernia   . Headache(784.0)    "recurring"  . High cholesterol   . Hyperlipidemia   . Hypertension   . 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 Migraines    "til ~ 1980"  . PONV (postoperative nausea and vomiting)   . Restless leg syndrome   . Sciatic nerve pain    "from pinched nerve"  . Sleep apnea    uses CPAP  . Weakness of right side of body    "I've had PT for it; they don't know what it's from".  CORTISONE INJECTION INTO BACK 08/30/12    Past Surgical History:  Procedure Laterality Date  . CATARACT EXTRACTION, BILATERAL Bilateral    Oct and Nov 2017  . CORONARY ANGIOPLASTY WITH STENT PLACEMENT  06/2011   "1"  . CORONARY ARTERY BYPASS GRAFT  2005   CABG X  2  . DILATION AND CURETTAGE OF UTERUS     "more than once"  . FRACTURE SURGERY  ~ 2005   nose  . 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 MOUTH SURGERY  2004   "bone replacement; had cadavear bones put in; face was collapsing"  . NASAL SEPTUM SURGERY  ~ 1986    Current Medications: Current Meds  Medication Sig  . amoxicillin (AMOXIL) 500 MG capsule Take 1 capsule (500 mg total) by mouth 2 (two) times daily.  [name] aspirin 325 MG tablet Take 325 mg by mouth daily.  [name] atorvastatin (LIPITOR) 80 MG tablet Take 1 tablet (80 mg total) by mouth daily.  . cetirizine (ZYRTEC) 10 MG tablet Take 10 mg by mouth daily. Reported on 12/23/2015  . clonazePAM (KLONOPIN) 1 MG tablet TAKE 2 TABLETS BY MOUTH AT BEDTIME  . Dexlansoprazole (DEXILANT) 30 MG capsule Take 1 capsule (30 mg total) by mouth daily.  [name] gabapentin (NEURONTIN) 300 MG capsule TAKE 1 TO 2 CAPSULES BY MOUTH AT BEDTIME  . hydrochlorothiazide (HYDRODIURIL)  25 MG tablet TAKE 1 TABLET (25 MG TOTAL) BY MOUTH DAILY.  . meloxicam (MOBIC) 15 MG tablet Take 15 mg by mouth daily. Reported on 12/23/2015  . metformin (FORTAMET) 1000 MG (OSM) 24 hr tablet TAKE 1 TABLET (1,000 MG TOTAL) BY MOUTH 2 (TWO) TIMES DAILY WITH A MEAL.  . metoprolol succinate (TOPROL-XL) 100 MG 24 hr tablet TAKE 1 TABLET (100 MG TOTAL) BY MOUTH DAILY. TAKE WITH OR IMMEDIATELY FOLLOWING A MEAL.  . mirabegron ER (MYRBETRIQ) 25 MG TB24 tablet Take 1 tablet (25 mg total) by mouth daily.  . Multiple Vitamins-Minerals (PRESERVISION AREDS PO) Take 1 tablet by mouth 2 (two) times daily. Reported on 12/23/2015  . nitroGLYCERIN (NITROSTAT) 0.4 MG SL tablet Place 0.4 mg under the tongue every 5 (five) minutes as needed for chest pain. Reported on 05/09/2016  . NOVOFINE 32G X 6 MM MISC USE EVERY DAY  . ONE TOUCH ULTRA TEST test strip CHECK BLOOD SUGAR TWICE A DAY  . ONETOUCH DELICA LANCETS 52D MISC Use to check blood sugar once daily.  Dx? E11.9  . predniSONE (DELTASONE) 10 MG  tablet Take 6 tablets on day 1, take 5 tablets on day 2, take 4 tablets on day 3, take 3 tablets on day 4, take 2 tablets on day 5, take 1 tablet on day 6  . Probiotic Product (PROBIOTIC DAILY PO) Take 1 tablet by mouth daily.   . sucralfate (CARAFATE) 1 GM/10ML suspension Take 10 mLs (1 g total) by mouth every 6 (six) hours as needed.  . venlafaxine XR (EFFEXOR-XR) 37.5 MG 24 hr capsule TAKE 1 CAPSULE (37.5 MG TOTAL) BY MOUTH 2 (TWO) TIMES DAILY.  [name] VICTOZA 18 MG/3ML SOPN INJECT 1.8 MG INTO THE SKIN DAILY.     Allergies:   Hydrocodone-acetaminophen; Percocet [oxycodone-acetaminophen]; and Sulfa antibiotics   Social History   Socioeconomic History  . Marital status: Married    Spouse name: None  . Number of children: 2  . Years of education: college  . Highest education level: None  Social Needs  . Financial resource strain: None  . Food insecurity - worry: None  . Food insecurity - inability: None  . Transportation needs - medical: None  . Transportation needs - non-medical: None  Occupational History  . Occupation: Retired   Tobacco Use  . Smoking status: Never Smoker  . Smokeless tobacco: Never Used  Substance and Sexual Activity  . Alcohol use: Yes    Alcohol/week: 0.6 oz    Types: 1 Glasses of wine per week    Comment: "occasionally drink wine"  . Drug use: No  . Sexual activity: Yes  Other Topics Concern  . None  Social History Narrative   Drinks 1 cup of coffee a day      Family History: The patient's family history includes Breast cancer in her mother; Cancer (age of onset: 31) in her mother and sister; Colon cancer in her sister; Dementia in her sister; Diabetes in her sister; GER disease in her daughter; Heart disease in her brother; Heart disease (age of onset: 63) in her father; Hypertension in her brother, daughter, and sister. There is no history of Heart attack or Stroke.  ROS:   Please see the history of present illness.    ROS  All other systems  reviewed and negative.   EKGs/Labs/Other Studies Reviewed:    The following studies were reviewed today: CPAP download  EKG:  EKG is not ordered today.    Recent Labs: 03/21/2017: Hemoglobin  12.1; Platelets 286 05/21/2017: ALT 18; BUN 22; Creatinine, Ser 0.97; Potassium 4.1; Sodium 141   Recent Lipid Panel    Component Value Date/Time   CHOL 117 05/21/2017 0956   TRIG 78.0 05/21/2017 0956   HDL 48.10 05/21/2017 0956   CHOLHDL 2 05/21/2017 0956   VLDL 15.6 05/21/2017 0956   LDLCALC 53 05/21/2017 0956    Physical Exam:    VS:  BP 128/82   Pulse 78   Ht 5\' 3"  (1.6 m)   Wt 154 lb 9.6 oz (70.1 kg)   BMI 27.39 kg/m     Wt Readings from Last 3 Encounters:  09/19/17 154 lb 9.6 oz (70.1 kg)  06/04/17 150 lb (68 kg)  05/24/17 151 lb (68.5 kg)     GEN:  Well nourished, well developed in no acute distress HEENT: Normal NECK: No JVD; No carotid bruits LYMPHATICS: No lymphadenopathy CARDIAC: RRR, no murmurs, rubs, gallops RESPIRATORY:  Clear to auscultation without rales, wheezing or rhonchi  ABDOMEN: Soft, non-tender, non-distended MUSCULOSKELETAL:  No edema; No deformity  SKIN: Warm and dry NEUROLOGIC:  Alert and oriented x 3 PSYCHIATRIC:  Normal affect   ASSESSMENT:    1. Obstructive sleep apnea   2. Essential hypertension    PLAN:    In order of problems listed above:  1.  OSA - the patient is tolerating PAP therapy well without any problems. The PAP download was reviewed today and showed an AHI of 6.4/hr on 14 cm H2O with 93% compliance in using more than 4 hours nightly.  The patient has been using and benefiting from CPAP use and will continue to benefit from therapy.   2.  HTN - BP is well controlled on exam today.  She will continue on HCTZ 25mg  daily and Toprol XL 100mg  daily   Medication Adjustments/Labs and Tests Ordered: Current medicines are reviewed at length with the patient today.  Concerns regarding medicines are outlined above.  No orders of the  defined types were placed in this encounter.  No orders of the defined types were placed in this encounter.   Signed, Fransico Him, MD  09/19/2017 11:34 AM    [place]

## 2017-09-19 NOTE — Patient Instructions (Signed)
Medication Instructions:  Your physician recommends that you continue on your current medications as directed. Please refer to the Current Medication list given to you today.  Labwork: None ordered   Testing/Procedures: None ordered   Follow-Up: Your physician wants you to follow-up in: 1 year with Dr. Radford Pax. You will receive a reminder letter in the mail two months in advance. If you don't receive a letter, please call our office to schedule the follow-up appointment.  Any Other Special Instructions Will Be Listed Below (If Applicable).  Dr. Radford Pax ordered new chin strap. You can follow up with Gae Bon, CPAP assistant at [phone] in about 1 week if you have not received a call.    If you need a refill on your cardiac medications before your next appointment, please call your pharmacy.

## 2017-09-20 ENCOUNTER — Other Ambulatory Visit: Payer: Self-pay | Admitting: Family Medicine

## 2017-09-20 NOTE — Telephone Encounter (Signed)
Last office visit 05/24/2017.  Last refilled 08/10/2017 for #60 with no refills.  Ok to refill?

## 2017-09-20 NOTE — Telephone Encounter (Signed)
Clonazepam called into [id number] - [place], [place] - [address] 

## 2017-09-24 ENCOUNTER — Other Ambulatory Visit: Payer: Self-pay | Admitting: Family Medicine

## 2017-09-24 ENCOUNTER — Ambulatory Visit
Admission: RE | Admit: 2017-09-24 | Discharge: 2017-09-24 | Disposition: A | Discharge status: Home / Self Care | Payer: Medicare Other | Source: Ambulatory Visit | Attending: Family Medicine | Admitting: Family Medicine

## 2017-09-24 DIAGNOSIS — Z1231 Encounter for screening mammogram for malignant neoplasm of breast: Secondary | ICD-10-CM

## 2017-09-24 HISTORY — DX: Malignant (primary) neoplasm, unspecified: C80.1

## 2017-09-26 ENCOUNTER — Ambulatory Visit: Payer: Medicare Other | Admitting: Cardiology

## 2017-09-26 ENCOUNTER — Encounter: Payer: Self-pay | Admitting: Cardiology

## 2017-09-26 VITALS — BP 142/70 | HR 73 | Resp 16 | Ht 62.75 in | Wt 153.4 lb

## 2017-09-26 DIAGNOSIS — R0602 Shortness of breath: Secondary | ICD-10-CM | POA: Diagnosis not present

## 2017-09-26 DIAGNOSIS — I2510 Atherosclerotic heart disease of native coronary artery without angina pectoris: Secondary | ICD-10-CM | POA: Diagnosis not present

## 2017-09-26 DIAGNOSIS — E669 Obesity, unspecified: Secondary | ICD-10-CM

## 2017-09-26 DIAGNOSIS — G4733 Obstructive sleep apnea (adult) (pediatric): Secondary | ICD-10-CM

## 2017-09-26 DIAGNOSIS — I10 Essential (primary) hypertension: Secondary | ICD-10-CM | POA: Diagnosis not present

## 2017-09-26 DIAGNOSIS — I2583 Coronary atherosclerosis due to lipid rich plaque: Secondary | ICD-10-CM

## 2017-09-26 NOTE — Progress Notes (Signed)
[place]. [address], [address],   [address] Phone: [phone] Fax:  [phone]  Date:  09/26/2017   ID:  Morgan [name], DOB 1941/05/14, MRN [id number]  PCP:  Jinny Sanders, MD   History of Present Illness: Morgan [name] is a 76 y.o. female with coronary artery disease status post bypass surgery in 2006, diagonal stent, DM, cerebellar TIA/Stroke 2015 here for followup.   Had episode of angioedema (testing for allergies has been done - negative except for cockroach) in April 2014, I stopped her losartan because of the small chance of cross reactivity/angioedema.  Cough has improved. ENT [facility]).   In review of her coronary disease, in April of 2012 she had cardiac catheterization with drug-eluting stent the first diagonal branch that had previously been determined to small for bypass in 2006. Had prior bypass at [facility].   Hyperlipidemia-prior LDL 58 on 8/14. Excellent. Blood pressure also under good control.   Glucose mildly elevated. Hemoglobin A1C was 8.4.  Knee replacement.   Had cerebellar TIA 2015. Nausea, flashes in front of eye. Working on diabetes. Metformin, diarrhea. Hemoglobin A1c 8.2. Excellent LDL 59. Mild bilateral carotid plaque. MRI 2017 confirmed cerebellar. Right side weak, broke elbow with fall.   Sleep apnea - feels real tired, sleeps at the drop of a hat. Had CP, hard to tell if GERD. GI work up. Dr. Havery Moros. On coctail to decrease H pylori.   Morgan [name], husband prostate cancer is back. Took 7 grandkids to [employer].   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 She does not require any further therapy as long as it does not come back. She has had some gait ataxia from her left cerebellar stroke/TIA in 2015. MRI once again showed this lesion. Neurology increased her aspirin to 325.  09/26/17 - winded with walking, coughing. Busy.  No chest pain.  Had a right posterior neck melanoma discovered by Dr. Radford Pax it removed.  She was very  thankful.  No syncope, bleeding, orthopnea, PND.  Wt Readings from Last 3 Encounters:  09/26/17 153 lb 6.4 oz (69.6 kg)  09/19/17 154 lb 9.6 oz (70.1 kg)  06/04/17 150 lb (68 kg)     Past Medical History:  Diagnosis Date  . Angina   . Arthritis   . Atrial fibrillation (HCC)    ASPIRIN FOR BLOOD THINNER  . Atypical mole 09/22/2016  . Bulging discs    lumbar   . Cancer ([place])    melanoma  . Complication of anesthesia    pt states has choking sensation with ET tube   . Coronary artery disease   . Depression   . Diabetes mellitus    diet controlled/on meds  . Fibromyalgia   . GERD (gastroesophageal reflux disease)   . H/O hiatal hernia   . Headache(784.0)    "recurring"  . High cholesterol   . Hyperlipidemia   . Hypertension   . 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 Migraines    "til ~ 1980"  . PONV (postoperative nausea and vomiting)   . Restless leg syndrome   . Sciatic nerve pain    "from pinched nerve"  . Sleep apnea    uses CPAP  . Weakness of right side of body    "I've had PT for it; they don't know what it's from".  CORTISONE INJECTION INTO BACK 08/30/12    Past Surgical History:  Procedure Laterality Date  . CATARACT EXTRACTION, BILATERAL Bilateral    Oct  and Nov 2017  . CORONARY ANGIOPLASTY WITH STENT PLACEMENT  06/2011   "1"  . CORONARY ARTERY BYPASS GRAFT  2005   CABG X 2  . DILATION AND CURETTAGE OF UTERUS     "more than once"  . FRACTURE SURGERY  ~ 2005   nose  . 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 MOUTH SURGERY  2004   "bone replacement; had cadavear bones put in; face was collapsing"  . NASAL SEPTUM SURGERY  ~ 1986    Current Outpatient Medications  Medication Sig Dispense Refill  . amoxicillin (AMOXIL) 500 MG capsule Take 1 capsule (500 mg total) by mouth 2 (two) times daily. 20 capsule 0  . aspirin 325 MG tablet Take 325 mg by mouth daily.    [name] atorvastatin (LIPITOR) 80 MG tablet Take 1 tablet (80 mg total) by mouth daily. 90  tablet 1  . cetirizine (ZYRTEC) 10 MG tablet Take 10 mg by mouth daily. Reported on 12/23/2015    . clonazePAM (KLONOPIN) 1 MG tablet TAKE 2 TABLETS BY MOUTH AT BEDTIME 60 tablet 0  . Dexlansoprazole (DEXILANT) 30 MG capsule Take 1 capsule (30 mg total) by mouth daily. 90 capsule 3  . gabapentin (NEURONTIN) 300 MG capsule TAKE 1 TO 2 CAPSULES BY MOUTH AT BEDTIME 180 capsule 1  . hydrochlorothiazide (HYDRODIURIL) 25 MG tablet TAKE 1 TABLET (25 MG TOTAL) BY MOUTH DAILY. 90 tablet 3  . meloxicam (MOBIC) 15 MG tablet Take 15 mg by mouth daily. Reported on 12/23/2015    . metformin (FORTAMET) 1000 MG (OSM) 24 hr tablet TAKE 1 TABLET (1,000 MG TOTAL) BY MOUTH 2 (TWO) TIMES DAILY WITH A MEAL. 180 tablet 3  . metoprolol succinate (TOPROL-XL) 100 MG 24 hr tablet TAKE 1 TABLET (100 MG TOTAL) BY MOUTH DAILY. TAKE WITH OR IMMEDIATELY FOLLOWING A MEAL. 90 tablet 1  . mirabegron ER (MYRBETRIQ) 25 MG TB24 tablet Take 1 tablet (25 mg total) by mouth daily. 30 tablet 11  . Multiple Vitamins-Minerals (PRESERVISION AREDS PO) Take 1 tablet by mouth 2 (two) times daily. Reported on 12/23/2015    . nitroGLYCERIN (NITROSTAT) 0.4 MG SL tablet Place 0.4 mg under the tongue every 5 (five) minutes as needed for chest pain. Reported on 05/09/2016    . NOVOFINE 32G X 6 MM MISC USE EVERY DAY 100 each 2  . ONE TOUCH ULTRA TEST test strip CHECK BLOOD SUGAR TWICE A DAY 100 each 5  . ONETOUCH DELICA LANCETS 44R MISC Use to check blood sugar once daily.  Dx? E11.9 100 each 3  . predniSONE (DELTASONE) 10 MG tablet Take 6 tablets on day 1, take 5 tablets on day 2, take 4 tablets on day 3, take 3 tablets on day 4, take 2 tablets on day 5, take 1 tablet on day 6 21 tablet 0  . Probiotic Product (PROBIOTIC DAILY PO) Take 1 tablet by mouth daily.     . sucralfate (CARAFATE) 1 GM/10ML suspension Take 10 mLs (1 g total) by mouth every 6 (six) hours as needed. 420 mL 3  . venlafaxine XR (EFFEXOR-XR) 37.5 MG 24 hr capsule TAKE 1 CAPSULE (37.5 MG  TOTAL) BY MOUTH 2 (TWO) TIMES DAILY. 180 capsule 1  . VICTOZA 18 MG/3ML SOPN INJECT 1.8 MG INTO THE SKIN DAILY. 18 pen 3   No current facility-administered medications for this visit.     Allergies:    Allergies  Allergen Reactions  . Hydrocodone-Acetaminophen Other (See Comments)    "  Changed personality" "made me very mean"  . Percocet [Oxycodone-Acetaminophen] Other (See Comments)    hallucination  . Sulfa Antibiotics Other (See Comments)    hallucinations    Social History:  The patient  reports that  has never smoked. she has never used smokeless tobacco. She reports that she drinks about 0.6 oz of alcohol per week. She reports that she does not use drugs.   ROS:  Please see the history of present illness.   Denies any fevers, chills, orthopnea, PND, syncope    PHYSICAL EXAM: VS:  BP (!) 142/70   Pulse 73   Resp 16   Ht 5' 2.75" (1.594 m)   Wt 153 lb 6.4 oz (69.6 kg)   SpO2 98%   BMI 27.39 kg/m  GEN: Well nourished, well developed, in no acute distress  HEENT: normal  Neck: no JVD, carotid bruits, or masses Cardiac: RRR; no murmurs, rubs, or gallops,no edema CABG scar Respiratory:  clear to auscultation bilaterally, normal work of breathing GI: soft, nontender, nondistended, + BS MS: no deformity or atrophy  Skin: warm and dry, no rash Neuro:  Alert and Oriented x 3, Strength and sensation are intact Psych: euthymic mood, full affect   EKG: EKG was ordered today-03/28/16-sinus rhythm, 79, T-wave inversion in V1, V2, flattening in V3, V4, V5. Nonspecific changes. Personally viewed-prior prior-03/17/15-sinus rhythm, 69, nonspecific T-wave changes-no change from prior.03/23/14-sinus rhythm, 63, nonspecific ST flattening. Overall normal.   NUC stress 04/07/16:  Nuclear stress EF: 64%.  There was no ST segment deviation noted during stress.  Defect 1: There is a medium defect of moderate severity present in the mid anterior and apex location.  This is a low risk  study.  The left ventricular ejection fraction is normal (55-65%).  ASSESSMENT AND PLAN:  1. Coronary artery disease-status post bypass/ DES diagonal placement. Continue beta blocker. Off Plavix. Aspirin 325 mg because of stroke.  Overall doing well without any anginal symptoms.  She does have shortness of breath.  I will check an echocardiogram to ensure proper structure and function.  Nuclear stress test in 2017 showed no ischemia. 2. Obstructive sleep apnea-Dr.Turner, notes reviewed.  Reviewed last clinic note.  She is having some trouble with the mask rubbing her nose bridge.  She was very appreciative of Dr. Radford Pax who discovered a right posterior neck mole, melanoma. 3. TIA-cerebellar. Nausea, vestibular symptoms. Eye Dr. Diagnosis. No evidence of AFIB. Has had a few issues (floating black spots, sick to stomach, occas HA) watch for any signs of orthopnea.  Overall she is doing well.  She has had some headaches. 4. S/p knee replacement 07/07/13 - Dr. Wynelle Link.   5. Hyperlipidemia-excellent lipids. Reviewed labs. Continue with atorvastatin. LDL 48.  6. HTN-mildly elevated. Continue to monitor.We will continue with current medications. Watch for any signs of worsening orthostasis. 7. Diabetes-metformin. Glucose reviewed. She is quite pleased. A1c 8.2 now 6.3 8. Obesity-has done a good job with weight loss. Exercise. Be careful with falls.  No changes made. 9. We will see back in 12 months.  Signed, Candee Furbish, MD [facility]  09/26/2017 11:08 AM

## 2017-09-26 NOTE — Patient Instructions (Signed)

## 2017-09-29 ENCOUNTER — Other Ambulatory Visit: Payer: Self-pay | Admitting: Family Medicine

## 2017-10-02 ENCOUNTER — Ambulatory Visit: Payer: Medicare Other | Admitting: Gastroenterology

## 2017-10-02 ENCOUNTER — Encounter: Payer: Self-pay | Admitting: Gastroenterology

## 2017-10-02 VITALS — BP 130/82 | HR 74 | Ht 63.0 in | Wt 153.0 lb

## 2017-10-02 DIAGNOSIS — K219 Gastro-esophageal reflux disease without esophagitis: Secondary | ICD-10-CM

## 2017-10-02 DIAGNOSIS — R194 Change in bowel habit: Secondary | ICD-10-CM

## 2017-10-02 DIAGNOSIS — R1310 Dysphagia, unspecified: Secondary | ICD-10-CM

## 2017-10-02 MED ORDER — DEXLANSOPRAZOLE 60 MG PO CPDR: 60.0000 mg | DELAYED_RELEASE_CAPSULE | Freq: Every day | ORAL | 1 refills | Status: DC

## 2017-10-02 NOTE — Progress Notes (Signed)
HPI :  76 year old female here for follow-up visit.  She is a history of GERD. See prior clinic visits for details of her notes. EGD done in February 2017 showed small hiatal hernia, no evidence of Barrett's esophagus. She had too numerous to count fundic gland polyps, several > 1cm in size. Several were removed as a representative sample including the largest polyps, and all consistent with benign fundic gland polyps. She subsequently had a 24 HR pH testing on twice daily Protonix which showed a Demeester score of 40 with majority of reflux episodes being acidic, with symptom index of 75%. 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   She was previously on Dexilant 60 mg once daily, she stated this had improved her symptoms about 70% compared to Protonix 40 mg twice daily. Over time we weaned down her Dexilant to 30mg  once daily. She states she is having worsening pyrosis and regurgitation is bothering her. She takes Carafate twice daily which tends to work well to help minimize her symptoms. She has some occasional postprandial upper abdominal discomfort and fullness. She's had a prior gastric imaging study in 2016 showing no evidence of gastroparesis.  She otherwise endorses some ongoing dysphagia to solids which is bothering her. No odynophagia or chest discomfort. She takes an aspirin daily but denies any use of NSAIDs. Prior EGD showed no evidence of esophageal stricture.  She otherwise reports she had baseline has a bowel movement per day. About once per month she has significant loose stools with urgency, and then they resolve back to normal. She is Imodium as needed which works well for this. She states the symptoms been ongoing for more than a year.   Last colonoscopy 10/2014 - 2 polyps removed, benign, told to f/u in 5 yrs given FH of CRC.    Past Medical History:  Diagnosis Date    . Angina   . Arthritis   . Atrial fibrillation (HCC)    ASPIRIN FOR BLOOD THINNER  . Atypical mole 09/22/2016  . Bulging discs    lumbar   . Cancer ([place])    melanoma  . Complication of anesthesia    pt states has choking sensation with ET tube   . Coronary artery disease   . Depression   . Diabetes mellitus    diet controlled/on meds  . Fibromyalgia   . GERD (gastroesophageal reflux disease)   . H/O hiatal hernia   . Headache(784.0)    "recurring"  . High cholesterol   . Hyperlipidemia   . Hypertension   . 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 Migraines    "til ~ 1980"  . PONV (postoperative nausea and vomiting)   . Restless leg syndrome   . Sciatic nerve pain    "from pinched nerve"  . Sleep apnea    uses CPAP  . Weakness of right side of body    "I've had PT for it; they don't know what it's from".  CORTISONE INJECTION INTO BACK 08/30/12     Past Surgical History:  Procedure Laterality Date  . [place] STUDY N/A 12/29/2015   Procedure: [place] STUDY;  Surgeon: Manus Gunning, MD;  Location: [facility];  Service: Gastroenterology;  Laterality: N/A;  . CARPAL TUNNEL RELEASE Left 07/02/2015   Procedure: CARPAL TUNNEL RELEASE;  Surgeon: Frederik Pear, MD;  Location: [place];  Service: Orthopedics;  Laterality: Left;  .  CATARACT EXTRACTION, BILATERAL Bilateral    Oct and Nov 2017  . CORONARY ANGIOPLASTY WITH STENT PLACEMENT  06/2011   "1"  . CORONARY ARTERY BYPASS GRAFT  2005   CABG X 2  . DILATION AND CURETTAGE OF UTERUS     "more than once"  . ELBOW ARTHROSCOPY Left 07/02/2015   Procedure: ARTHROSCOPY LEFT ELBOW WITH DEBRIDEMENT AND REMOVAL LOOSE BODY;  Surgeon: Frederik Pear, MD;  Location: [place];  Service: Orthopedics;  Laterality: Left;  . ESOPHAGEAL MANOMETRY N/A 12/29/2015   Procedure: ESOPHAGEAL MANOMETRY (EM);  Surgeon: Manus Gunning, MD;  Location: [facility];  Service: Gastroenterology;  Laterality: N/A;  .  FRACTURE SURGERY  ~ 2005   nose  . KNEE ARTHROSCOPY  09/04/2012   Procedure: ARTHROSCOPY KNEE;  Surgeon: Gearlean Alf, MD;  Location: [facility];  Service: Orthopedics;  Laterality: Right;  right knee arthroscopy with medial and lateral meniscus debridement  . 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 MOUTH SURGERY  2004   "bone replacement; had cadavear bones put in; face was collapsing"  . NASAL SEPTUM SURGERY  ~ 1986  . TOTAL KNEE ARTHROPLASTY Right 07/07/2013   Procedure: RIGHT TOTAL KNEE ARTHROPLASTY;  Surgeon: Gearlean Alf, MD;  Location: [facility];  Service: Orthopedics;  Laterality: Right;   Family History  Problem Relation Age of Onset  . Cancer Mother 46       breast cancer  . Breast cancer Mother 30  . Heart disease Father 7       sudden onset due to CAD  . Cancer Sister 28       colon  . Hypertension Sister   . Dementia Sister   . Diabetes Sister   . Colon cancer Sister   . GER disease Daughter   . Hypertension Daughter   . Heart disease Brother   . Hypertension Brother   . Heart attack Neg Hx   . Stroke Neg Hx    Social History   Tobacco Use  . Smoking status: Never Smoker  . Smokeless tobacco: Never Used  Substance Use Topics  . Alcohol use: Yes    Alcohol/week: 0.6 oz    Types: 1 Glasses of wine per week    Comment: "occasionally drink wine"  . Drug use: No   Current Outpatient Medications  Medication Sig Dispense Refill  . aspirin 325 MG tablet Take 325 mg by mouth daily.    [name] atorvastatin (LIPITOR) 80 MG tablet Take 1 tablet (80 mg total) by mouth daily. 90 tablet 1  . cetirizine (ZYRTEC) 10 MG tablet Take 10 mg by mouth daily. Reported on 12/23/2015    . clonazePAM (KLONOPIN) 1 MG tablet TAKE 2 TABLETS BY MOUTH AT BEDTIME 60 tablet 0  . gabapentin (NEURONTIN) 300 MG capsule TAKE 1 TO 2 CAPSULES BY MOUTH AT BEDTIME 180 capsule 1  . hydrochlorothiazide (HYDRODIURIL) 25 MG tablet TAKE 1 TABLET (25 MG TOTAL) BY MOUTH DAILY. 90 tablet 3    . meloxicam (MOBIC) 15 MG tablet Take 15 mg by mouth daily. Reported on 12/23/2015    . metformin (FORTAMET) 1000 MG (OSM) 24 hr tablet TAKE 1 TABLET (1,000 MG TOTAL) BY MOUTH 2 (TWO) TIMES DAILY WITH A MEAL. 180 tablet 3  . metoprolol succinate (TOPROL-XL) 100 MG 24 hr tablet TAKE 1 TABLET (100 MG TOTAL) BY MOUTH DAILY. TAKE WITH OR IMMEDIATELY FOLLOWING A MEAL. 90 tablet 1  . Multiple Vitamins-Minerals (PRESERVISION AREDS PO)  Take 1 tablet by mouth 2 (two) times daily. Reported on 12/23/2015    . MYRBETRIQ 25 MG TB24 tablet TAKE 1 TABLET BY MOUTH EVERY DAY 30 tablet 5  . nitroGLYCERIN (NITROSTAT) 0.4 MG SL tablet Place 0.4 mg under the tongue every 5 (five) minutes as needed for chest pain. Reported on 05/09/2016    . NOVOFINE 32G X 6 MM MISC USE EVERY DAY 100 each 2  . ONE TOUCH ULTRA TEST test strip CHECK BLOOD SUGAR TWICE A DAY 100 each 5  . ONETOUCH DELICA LANCETS 56E MISC Use to check blood sugar once daily.  Dx? E11.9 100 each 3  . Probiotic Product (PROBIOTIC DAILY PO) Take 1 tablet by mouth daily.     . sucralfate (CARAFATE) 1 GM/10ML suspension Take 10 mLs (1 g total) by mouth every 6 (six) hours as needed. 420 mL 3  . venlafaxine XR (EFFEXOR-XR) 37.5 MG 24 hr capsule TAKE 1 CAPSULE (37.5 MG TOTAL) BY MOUTH 2 (TWO) TIMES DAILY. 180 capsule 1  . VICTOZA 18 MG/3ML SOPN INJECT 1.8 MG INTO THE SKIN DAILY. 18 pen 3  . dexlansoprazole (DEXILANT) 60 MG capsule Take 1 capsule (60 mg total) by mouth daily. 90 capsule 1   No current facility-administered medications for this visit.    Allergies  Allergen Reactions  . Hydrocodone-Acetaminophen Other (See Comments)    "Changed personality" "made me very mean"  . Percocet [Oxycodone-Acetaminophen] Other (See Comments)    hallucination  . Sulfa Antibiotics Other (See Comments)    hallucinations     Review of Systems: All systems reviewed and negative except where noted in HPI.    Mm Screening Breast Tomo Bilateral  Result Date:  09/24/2017 CLINICAL DATA:  Screening. EXAM: 2D DIGITAL SCREENING BILATERAL MAMMOGRAM WITH CAD AND ADJUNCT TOMO COMPARISON:  Previous exam(s). ACR Breast Density Category b: There are scattered areas of fibroglandular density. FINDINGS: There are no findings suspicious for malignancy. Images were processed with CAD. IMPRESSION: No mammographic evidence of malignancy. A result letter of this screening mammogram will be mailed directly to the patient. RECOMMENDATION: Screening mammogram in one year. (Code:SM-B-01Y) BI-RADS CATEGORY  1: Negative. Electronically Signed   By: Franki Cabot M.D.   On: 09/24/2017 16:20    Physical Exam: BP 130/82   Pulse 74   Ht 5\' 3"  (1.6 m)   Wt 153 lb (69.4 kg)   BMI 27.10 kg/m  Constitutional: Pleasant,well-developed, female in no acute distress. HEENT: Normocephalic and atraumatic. Conjunctivae are normal. No scleral icterus. Neck supple.  Cardiovascular: Normal rate, regular rhythm.  Pulmonary/chest: Effort normal and breath sounds normal. No wheezing, rales or rhonchi. Abdominal: Soft, nondistended, nontender. There are no masses palpable. No hepatomegaly. Extremities: no edema Lymphadenopathy: No cervical adenopathy noted. Neurological: Alert and oriented to person place and time. Skin: Skin is warm and dry. No rashes noted. Psychiatric: Normal mood and affect. Behavior is normal.   ASSESSMENT AND PLAN: 76 year old female here for reassessment of the following issues:  GERD - severe reflux as evidenced on prior pH study while on high-dose Protonix. Did better on high-dose Dexilant however symptoms have worsened on low dose Dexilant. She has some concerns about longstanding chronic PPI use, namely her gastric polyps. I discussed long-term risks and benefits of PPI therapy in general with her at length. I reassured her the gastric fundic gland polyps are extremely low risk for cancerous development. She feels much worse symptomatically if she stops PPI, thus  I think for her quality of life,  especially with poorly controlled reflux on low-dose Dexilant, that increasing back to 60mg  Dexilant now is reasonable. She can continue carafate PRN which helps. I don't think she is a good candidate for surgery for multiple reasons - underlying esophageal manometry abnormality and comorbidities. She agreed  Dysphagia - unclear if due to jackhammer esophagus or perhaps she developed a stricture from reflux. Recommend barium swallow to evaluate. If a stricture is noted, she would be agreeable to EGD with dilation. We otherwise discussed potentially putting her on a trial of Diltiazem pending finding, but would need to discuss with cardiologist in light of her beta blocker. Will hold off on that for now  Change in bowel habits - recommend daily fiber supplement, see if this helps regulate her. Rare symptoms of loose stools currently. If this worsens or persist I asked her to contact me.  [place] Cellar, MD [facility] Pager [phone]  CC: Jinny Sanders, MD

## 2017-10-02 NOTE — Patient Instructions (Addendum)
If you are age 75 or older, your body mass index should be between 23-30. Your Body mass index is 27.1 kg/m. If this is out of the aforementioned range listed, please consider follow up with your Primary Care Provider.  If you are age 59 or younger, your body mass index should be between 19-25. Your Body mass index is 27.1 kg/m. If this is out of the aformentioned range listed, please consider follow up with your Primary Care Provider.    You have been scheduled for a Barium Esophogram at [facility] (1st floor of the hospital) on Friday, 10-12-17 at 10:30AM. Please arrive 15 minutes prior to your appointment for registration. Make certain not to have anything to eat or drink 3 hours prior to your test. If you need to reschedule for any reason, please contact radiology at [phone] to do so. __________________________________________________________________ A barium swallow is an examination that concentrates on views of the esophagus. This tends to be a double contrast exam (barium and two liquids which, when combined, create a gas to distend the wall of the oesophagus) or single contrast (non-ionic iodine based). The study is usually tailored to your symptoms so a good history is essential. Attention is paid during the study to the form, structure and configuration of the esophagus, looking for functional disorders (such as aspiration, dysphagia, achalasia, motility and reflux) EXAMINATION You may be asked to change into a gown, depending on the type of swallow being performed. A radiologist and radiographer will perform the procedure. The radiologist will advise you of the type of contrast selected for your procedure and direct you during the exam. You will be asked to stand, sit or lie in several different positions and to hold a small amount of fluid in your mouth before being asked to swallow while the imaging is performed .In some instances you may be asked to swallow barium coated  marshmallows to assess the motility of a solid food bolus. The exam can be recorded as a digital or video fluoroscopy procedure. POST PROCEDURE It will take 1-2 days for the barium to pass through your system. To facilitate this, it is important, unless otherwise directed, to increase your fluids for the next 24-48hrs and to resume your normal diet.  This test typically takes about 30 minutes to perform. _______________________________________________________________________  We have sent the following medications to your pharmacy for you to pick up at your convenience: Dexilant 60mg   Please continue taking Carafate.  Begin using a daily fiber supplement.  Thank you.

## 2017-10-03 ENCOUNTER — Other Ambulatory Visit: Payer: Self-pay

## 2017-10-03 ENCOUNTER — Ambulatory Visit (HOSPITAL_COMMUNITY): Payer: Medicare Other | Attending: Internal Medicine

## 2017-10-03 DIAGNOSIS — I429 Cardiomyopathy, unspecified: Secondary | ICD-10-CM | POA: Diagnosis present

## 2017-10-03 DIAGNOSIS — Z8673 Personal history of transient ischemic attack (TIA), and cerebral infarction without residual deficits: Secondary | ICD-10-CM | POA: Diagnosis not present

## 2017-10-03 DIAGNOSIS — R0602 Shortness of breath: Secondary | ICD-10-CM | POA: Diagnosis not present

## 2017-10-03 DIAGNOSIS — I2510 Atherosclerotic heart disease of native coronary artery without angina pectoris: Secondary | ICD-10-CM | POA: Diagnosis not present

## 2017-10-03 DIAGNOSIS — I119 Hypertensive heart disease without heart failure: Secondary | ICD-10-CM | POA: Insufficient documentation

## 2017-10-03 DIAGNOSIS — E785 Hyperlipidemia, unspecified: Secondary | ICD-10-CM | POA: Diagnosis not present

## 2017-10-03 DIAGNOSIS — E119 Type 2 diabetes mellitus without complications: Secondary | ICD-10-CM | POA: Diagnosis not present

## 2017-10-05 ENCOUNTER — Other Ambulatory Visit: Payer: Self-pay | Admitting: Family Medicine

## 2017-10-07 NOTE — Telephone Encounter (Signed)
Last office visit 05/24/17.  Last refilled 04/10/17 for #180 with 1 refill.  Ok to refill?

## 2017-10-08 ENCOUNTER — Telehealth: Payer: Self-pay

## 2017-10-08 MED ORDER — CHLORTHALIDONE 25 MG PO TABS: 25.0000 mg | ORAL_TABLET | Freq: Every day | ORAL | 3 refills | Status: DC

## 2017-10-08 NOTE — Telephone Encounter (Signed)
Notes recorded by Teressa Senter, RN on 10/08/2017 at 9:25 AM EST Patient made aware of results. Educated the patient to stop taking the HCTZ and she will start chlorthalidone 25 mg a day. Patient verbalizes understanding and thanked me for the call

## 2017-10-08 NOTE — Telephone Encounter (Signed)
-----   Message from Jerline Pain, MD sent at 10/08/2017  7:58 AM EST ----- EF is normal, reassuring. Diastolic dysfunction is present (stiffeness upon relaxation of heart) which is a common finding with aging. We can try switching HCTZ 25 to chlorthalidone 25mg  qd. Candee Furbish, MD

## 2017-10-12 ENCOUNTER — Ambulatory Visit (HOSPITAL_COMMUNITY): Payer: Medicare Other | Attending: Gastroenterology

## 2017-10-21 ENCOUNTER — Other Ambulatory Visit: Payer: Self-pay | Admitting: Cardiology

## 2017-10-25 ENCOUNTER — Other Ambulatory Visit: Payer: Self-pay | Admitting: *Deleted

## 2017-10-25 MED ORDER — MIRABEGRON ER 25 MG PO TB24: 25.0000 mg | ORAL_TABLET | Freq: Every day | ORAL | 1 refills | Status: DC

## 2017-10-25 NOTE — Telephone Encounter (Signed)
Received [facility] from [facility] requesting 90 day supply.

## 2017-10-30 ENCOUNTER — Other Ambulatory Visit: Payer: Self-pay | Admitting: Family Medicine

## 2017-11-01 ENCOUNTER — Other Ambulatory Visit: Payer: Self-pay | Admitting: Family Medicine

## 2017-11-02 MED ORDER — CLONAZEPAM 1 MG PO TABS: 2.0000 mg | ORAL_TABLET | Freq: Every day | ORAL | 0 refills | Status: DC

## 2017-11-02 NOTE — Addendum Note (Signed)
Addended by: Helene Shoe on: 11/02/2017 04:04 PM   Modules accepted: Orders

## 2017-11-02 NOTE — Telephone Encounter (Signed)
Morgan [name] with [facility] said they do not have clonazepam in stock and she will d/c rx and [facility] [address]. Pt does not need cb because [facility] will let pt know when ready for pick up. [facility]azepam [facility] [address].

## 2017-11-02 NOTE — Telephone Encounter (Signed)
Last office visit 05/24/2017.  Last refilled 09/20/2017 for #60 with no refills.  Ok to refill?

## 2017-11-02 NOTE — Telephone Encounter (Signed)
Clonazepam called into [id number] - , [place] - [address] 

## 2017-11-23 ENCOUNTER — Other Ambulatory Visit: Payer: Self-pay | Admitting: Gastroenterology

## 2017-11-26 ENCOUNTER — Ambulatory Visit: Payer: Medicare Other

## 2017-11-30 ENCOUNTER — Encounter: Payer: Medicare Other | Admitting: Family Medicine

## 2017-12-03 ENCOUNTER — Telehealth: Payer: Self-pay | Admitting: Family Medicine

## 2017-12-03 DIAGNOSIS — M8580 Other specified disorders of bone density and structure, unspecified site: Secondary | ICD-10-CM

## 2017-12-03 DIAGNOSIS — E119 Type 2 diabetes mellitus without complications: Secondary | ICD-10-CM

## 2017-12-03 NOTE — Telephone Encounter (Signed)
-----   Message from Eustace Pen, LPN sent at 2/00/3794  2:37 PM EST ----- Regarding: Labs 1/21 Lab orders needed. Thank you.  Insurance:  [facility]

## 2017-12-07 ENCOUNTER — Ambulatory Visit (INDEPENDENT_AMBULATORY_CARE_PROVIDER_SITE_OTHER): Payer: Medicare Other

## 2017-12-07 VITALS — BP 120/70 | HR 50 | Temp 98.0°F | Ht 62.25 in | Wt 147.2 lb

## 2017-12-07 DIAGNOSIS — Z0000 Encounter for general adult medical examination without abnormal findings: Secondary | ICD-10-CM

## 2017-12-07 DIAGNOSIS — E119 Type 2 diabetes mellitus without complications: Secondary | ICD-10-CM | POA: Diagnosis not present

## 2017-12-07 DIAGNOSIS — M8580 Other specified disorders of bone density and structure, unspecified site: Secondary | ICD-10-CM | POA: Diagnosis not present

## 2017-12-07 LAB — COMPREHENSIVE METABOLIC PANEL
ALK PHOS: 60 U/L (ref 39–117)
ALT: 23 U/L (ref 0–35)
AST: 23 U/L (ref 0–37)
Albumin: 4 g/dL (ref 3.5–5.2)
BILIRUBIN TOTAL: 0.5 mg/dL (ref 0.2–1.2)
BUN: 21 mg/dL (ref 6–23)
CALCIUM: 9.7 mg/dL (ref 8.4–10.5)
CO2: 33 meq/L — AB (ref 19–32)
Chloride: 98 mEq/L (ref 96–112)
Creatinine, Ser: 0.9 mg/dL (ref 0.40–1.20)
GFR: 64.75 mL/min (ref >60.00)
Glucose, Bld: 90 mg/dL (ref 70–99)
Potassium: 3.9 mEq/L (ref 3.5–5.1)
Sodium: 142 mEq/L (ref 135–145)
TOTAL PROTEIN: 6.6 g/dL (ref 6.0–8.3)

## 2017-12-07 LAB — MICROALBUMIN / CREATININE URINE RATIO
CREATININE, U: 169.4 mg/dL
MICROALB/CREAT RATIO: 1.6 mg/g (ref 0.0–30.0)
Microalb, Ur: 2.6 mg/dL — ABNORMAL HIGH (ref 0.0–1.9)

## 2017-12-07 LAB — LIPID PANEL
Cholesterol: 117 mg/dL (ref 0–200)
HDL: 45.9 mg/dL (ref >39.00)
LDL Cholesterol: 54 mg/dL (ref 0–99)
NonHDL: 71.42
TRIGLYCERIDES: 88 mg/dL (ref 0.0–149.0)
Total CHOL/HDL Ratio: 3
VLDL: 17.6 mg/dL (ref 0.0–40.0)

## 2017-12-07 LAB — HEMOGLOBIN A1C: Hgb A1c MFr Bld: 6.5 % (ref 4.6–6.5)

## 2017-12-07 LAB — VITAMIN D 25 HYDROXY (VIT D DEFICIENCY, FRACTURES): VITD: 70.94 ng/mL (ref 30.00–100.00)

## 2017-12-07 NOTE — Progress Notes (Signed)
PCP notes:   Health maintenance:  Tetanus vaccine - postponed/insurance A1C - completed Microalbumin - completed  Abnormal screenings:   Depression score: 3 Depression screen [facility] 2/9 12/07/2017 11/14/2016 08/10/2015 07/23/2014  Decreased Interest 0 0 0 0  Down, Depressed, Hopeless 1 0 1 0  PHQ - 2 Score 1 0 1 0  Altered sleeping 0 - - -  Tired, decreased energy 0 - - -  Change in appetite 0 - - -  Feeling bad or failure about yourself  1 - - -  Trouble concentrating 0 - - -  Moving slowly or fidgety/restless 1 - - -  Suicidal thoughts 0 - - -  PHQ-9 Score 3 - - -  Difficult doing work/chores Not difficult at all - - -  Some recent data might be hidden   Patient concerns:   None  Nurse concerns:  None  Next PCP appt:   12/20/17 @ 1000

## 2017-12-07 NOTE — Progress Notes (Signed)
I reviewed health advisor's note, was available for consultation, and agree with documentation and plan.  

## 2017-12-07 NOTE — Progress Notes (Signed)
Pre visit review using our clinic review tool, if applicable. No additional management support is needed unless otherwise documented below in the visit note. 

## 2017-12-07 NOTE — Progress Notes (Signed)
Subjective:   Anaid Haney is a 77 y.o. female who presents for Medicare Annual (Subsequent) preventive examination.  Review of Systems:  N/A Cardiac Risk Factors include: advanced age (>67men, >5 women);diabetes mellitus;dyslipidemia;hypertension     Objective:     Vitals: BP 120/70 (BP Location: Right Arm, Patient Position: Sitting, Cuff Size: Normal)   Pulse (!) 50   Temp 98 F (36.7 C) (Oral)   Ht 5' 2.25" (1.581 m) Comment: no shoes  Wt 147 lb 4 oz (66.8 kg)   SpO2 98%   BMI 26.72 kg/m   Body mass index is 26.72 kg/m.  Advanced Directives 12/07/2017 06/04/2017 03/21/2017 11/14/2016 12/01/2015 07/02/2015 07/01/2015  Does Patient Have a Medical Advance Directive? Yes Yes Yes Yes Yes Yes Yes  Type of Paramedic of [place];Living will [place];Living will Living will [place];Living will [place];Living will;Advance instruction for mental health treatment Living will -  Does patient want to make changes to medical advance directive? - No - Patient declined - - No - Patient declined - -  Copy of [place] in Chart? No - copy requested Yes - No - copy requested (No Data) No - copy requested -  Pre-existing out of facility DNR order (yellow form or pink MOST form) - - - - - - -    Tobacco Social History   Tobacco Use  Smoking Status Never Smoker  Smokeless Tobacco Never Used     Counseling given: No   Clinical Intake:  Pre-visit preparation completed: Yes  Pain : No/denies pain Pain Score: 0-No pain     Nutritional Status: BMI 25 -29 Overweight Nutritional Risks: None Diabetes: Yes CBG done?: No Did pt. bring in CBG monitor from home?: No  How often do you need to have someone help you when you read instructions, pamphlets, or other written materials from your doctor or pharmacy?: 1 - Never What is the last grade level you completed in school?: 12th grade + some  college  Interpreter Needed?: No  Comments: pt lives with spouse Information entered by :: LPinson, LPN  Past Medical History:  Diagnosis Date  . Angina   . Arthritis   . Atrial fibrillation (HCC)    ASPIRIN FOR BLOOD THINNER  . Atypical mole 09/22/2016  . Bulging discs    lumbar   . Cancer ([place])    melanoma  . Complication of anesthesia    pt states has choking sensation with ET tube   . Coronary artery disease   . Depression   . Diabetes mellitus    diet controlled/on meds  . Fibromyalgia   . GERD (gastroesophageal reflux disease)   . H/O hiatal hernia   . Headache(784.0)    "recurring"  . High cholesterol   . Hyperlipidemia   . Hypertension   . 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 Migraines    "til ~ 1980"  . PONV (postoperative nausea and vomiting)   . Restless leg syndrome   . Sciatic nerve pain    "from pinched nerve"  . Sleep apnea    uses CPAP  . Weakness of right side of body    "I've had PT for it; they don't know what it's from".  CORTISONE INJECTION INTO BACK 08/30/12   Past Surgical History:  Procedure Laterality Date  . [place] STUDY N/A 12/29/2015   Procedure: [place] STUDY;  Surgeon: Manus Gunning, MD;  Location: [facility];  Service: Gastroenterology;  Laterality: N/A;  . CARPAL TUNNEL RELEASE Left 07/02/2015   Procedure: CARPAL TUNNEL RELEASE;  Surgeon: Frederik Pear, MD;  Location: [facility];  Service: Orthopedics;  Laterality: Left;  . CATARACT EXTRACTION, BILATERAL Bilateral    Oct and Nov 2017  . CORONARY ANGIOPLASTY WITH STENT PLACEMENT  06/2011   "1"  . CORONARY ARTERY BYPASS GRAFT  2005   CABG X 2  . DILATION AND CURETTAGE OF UTERUS     "more than once"  . ELBOW ARTHROSCOPY Left 07/02/2015   Procedure: ARTHROSCOPY LEFT ELBOW WITH DEBRIDEMENT AND REMOVAL LOOSE BODY;  Surgeon: Frederik Pear, MD;  Location: [facility];  Service: Orthopedics;  Laterality: Left;  . ESOPHAGEAL MANOMETRY N/A 12/29/2015    Procedure: ESOPHAGEAL MANOMETRY (EM);  Surgeon: Manus Gunning, MD;  Location: [facility];  Service: Gastroenterology;  Laterality: N/A;  . FRACTURE SURGERY  ~ 2005   nose  . KNEE ARTHROSCOPY  09/04/2012   Procedure: ARTHROSCOPY KNEE;  Surgeon: Gearlean Alf, MD;  Location: [facility];  Service: Orthopedics;  Laterality: Right;  right knee arthroscopy with medial and lateral meniscus debridement  . 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 MOUTH SURGERY  2004   "bone replacement; had cadavear bones put in; face was collapsing"  . NASAL SEPTUM SURGERY  ~ 1986  . TOTAL KNEE ARTHROPLASTY Right 07/07/2013   Procedure: RIGHT TOTAL KNEE ARTHROPLASTY;  Surgeon: Gearlean Alf, MD;  Location: [facility];  Service: Orthopedics;  Laterality: Right;   Family History  Problem Relation Age of Onset  . Cancer Mother 26       breast cancer  . Breast cancer Mother 61  . Heart disease Father 73       sudden onset due to CAD  . Cancer Sister 38       colon  . Hypertension Sister   . Dementia Sister   . Diabetes Sister   . Colon cancer Sister   . GER disease Daughter   . Hypertension Daughter   . Heart disease Brother   . Hypertension Brother   . Heart attack Neg Hx   . Stroke Neg Hx    Social History   Socioeconomic History  . Marital status: Married    Spouse name: None  . Number of children: 2  . Years of education: college  . Highest education level: None  Social Needs  . Financial resource strain: None  . Food insecurity - worry: None  . Food insecurity - inability: None  . Transportation needs - medical: None  . Transportation needs - non-medical: None  Occupational History  . Occupation: Retired   Tobacco Use  . Smoking status: Never Smoker  . Smokeless tobacco: Never Used  Substance and Sexual Activity  . Alcohol use: Yes    Alcohol/week: 0.6 oz    Types: 1 Glasses of wine per week    Comment: "occasionally drink wine"  . Drug use: No  . Sexual  activity: Yes  Other Topics Concern  . None  Social History Narrative   Drinks 1 cup of coffee a day     Outpatient Encounter Medications as of 12/07/2017  Medication Sig  . aspirin 325 MG tablet Take 325 mg by mouth daily.  [name] atorvastatin (LIPITOR) 80 MG tablet TAKE 1 TABLET BY MOUTH EVERY DAY  . CARAFATE 1 GM/10ML suspension TAKE 10 MLS (1 G TOTAL) BY MOUTH EVERY 6 (SIX) HOURS AS NEEDED.  [name]  cetirizine (ZYRTEC) 10 MG tablet Take 10 mg by mouth daily. Reported on 12/23/2015  . chlorthalidone (HYGROTON) 25 MG tablet Take 1 tablet (25 mg total) by mouth daily.  . clonazePAM (KLONOPIN) 1 MG tablet Take 2 tablets (2 mg total) by mouth at bedtime.  [name] dexlansoprazole (DEXILANT) 60 MG capsule Take 1 capsule (60 mg total) by mouth daily.  [name] gabapentin (NEURONTIN) 300 MG capsule TAKE 1 TO 2 CAPSULES BY MOUTH AT BEDTIME  . meloxicam (MOBIC) 15 MG tablet Take 15 mg by mouth daily. Reported on 12/23/2015  . metformin (FORTAMET) 1000 MG (OSM) 24 hr tablet TAKE 1 TABLET (1,000 MG TOTAL) BY MOUTH 2 (TWO) TIMES DAILY WITH A MEAL.  . metoprolol succinate (TOPROL-XL) 100 MG 24 hr tablet TAKE 1 TABLET (100 MG TOTAL) BY MOUTH DAILY. TAKE WITH OR IMMEDIATELY FOLLOWING A MEAL.  . mirabegron ER (MYRBETRIQ) 25 MG TB24 tablet Take 1 tablet (25 mg total) by mouth daily.  . Multiple Vitamins-Minerals (PRESERVISION AREDS PO) Take 1 tablet by mouth 2 (two) times daily. Reported on 12/23/2015  . nitroGLYCERIN (NITROSTAT) 0.4 MG SL tablet Place 0.4 mg under the tongue every 5 (five) minutes as needed for chest pain. Reported on 05/09/2016  . NOVOFINE 32G X 6 MM MISC USE EVERY DAY  . ONE TOUCH ULTRA TEST test strip CHECK BLOOD SUGAR TWICE A DAY  . ONETOUCH DELICA LANCETS 93Z MISC Use to check blood sugar once daily.  Dx? E11.9  . Probiotic Product (PROBIOTIC DAILY PO) Take 1 tablet by mouth daily.   [name] venlafaxine XR (EFFEXOR-XR) 37.5 MG 24 hr capsule TAKE 1 CAPSULE (37.5 MG TOTAL) BY MOUTH 2 (TWO) TIMES DAILY.  [name] VICTOZA 18  MG/3ML SOPN INJECT 1.8 MG INTO THE SKIN DAILY.   No facility-administered encounter medications on file as of 12/07/2017.     Activities of Daily Living In your present state of health, do you have any difficulty performing the following activities: 12/07/2017  Hearing? N  Vision? N  Difficulty concentrating or making decisions? N  Walking or climbing stairs? N  Dressing or bathing? N  Doing errands, shopping? N  Preparing Food and eating ? N  Using the Toilet? N  In the past six months, have you accidently leaked urine? N  Do you have problems with loss of bowel control? Y  Managing your Medications? N  Managing your Finances? N  Housekeeping or managing your Housekeeping? N  Some recent data might be hidden    Patient Care Team: Jinny Sanders, MD as PCP - General (Family Medicine) Monna Fam, MD as Consulting Physician (Ophthalmology)    Assessment:   This is a routine wellness examination for Yarisa.   Hearing Screening   125Hz  250Hz  500Hz  1000Hz  2000Hz  3000Hz  4000Hz  6000Hz  8000Hz   Right ear:   40 40 40  40    Left ear:   40 40 40  40    Vision Screening Comments: Last vision exam in June 2018   Exercise Activities and Dietary recommendations Current Exercise Habits: The patient does not participate in regular exercise at present, Exercise limited by: None identified  Goals    . Increase physical activity     When weather permits, I will attempt to walk at least 30 minutes 3 days per week.        Fall Risk Fall Risk  12/07/2017 11/14/2016 06/19/2016 08/10/2015 07/23/2014  Falls in the past year? No No Yes Yes Yes  Number falls in past yr: - -  2 or more 2 or more 2 or more  Injury with Fall? - - No Yes -  Risk Factor Category  - - - - High Fall Risk  Risk for fall due to : - - History of fall(s) Impaired mobility Impaired balance/gait  Follow up - - Falls prevention discussed - -   IDepression Screen PHQ 2/9 Scores 12/07/2017 11/14/2016 08/10/2015 07/23/2014  PHQ -  2 Score 1 0 1 0  PHQ- 9 Score 3 - - -     Cognitive Function MMSE - Mini Mental State Exam 12/07/2017 11/14/2016  Orientation to time 5 5  Orientation to Place 5 5  Registration 3 3  Attention/ Calculation 0 0  Recall 3 3  Language- name 2 objects 0 0  Language- repeat 1 1  Language- follow 3 step command 3 3  Language- read & follow direction 0 0  Write a sentence 0 0  Copy design 0 0  Total score 20 20       PLEASE NOTE: A Mini-Cog screen was completed. Maximum score is 20. A value of 0 denotes this part of [place] MMSE was not completed or the patient failed this part of the Mini-Cog screening.   Mini-Cog Screening Orientation to Time - Max 5 pts Orientation to Place - Max 5 pts Registration - Max 3 pts Recall - Max 3 pts Language Repeat - Max 1 pts Language Follow 3 Step Command - Max 3 pts   Immunization History  Administered Date(s) Administered  . Influenza, High Dose Seasonal PF 08/04/2016, 08/21/2017  . Influenza,inj,Quad PF,6+ Mos 07/23/2014, 08/10/2015  . Pneumococcal Conjugate-13 10/27/2014  . Pneumococcal Polysaccharide-23 09/02/2007, 10/03/2013  . Td 11/14/2007  . Zoster 02/09/2010    Screening Tests Health Maintenance  Topic Date Due  . TETANUS/TDAP  12/07/2018 (Originally 11/13/2017)  . OPHTHALMOLOGY EXAM  05/02/2018  . FOOT EXAM  05/24/2018  . HEMOGLOBIN A1C  06/06/2018  . URINE MICROALBUMIN  12/07/2018  . COLONOSCOPY  10/14/2019  . INFLUENZA VACCINE  Completed  . DEXA SCAN  Completed  . PNA vac Low Risk Adult  Completed         Plan:     I have personally reviewed, addressed, and noted the following in the patient's chart:  A. Medical and social history B. Use of alcohol, tobacco or illicit drugs  C. Current medications and supplements D. Functional ability and status E.  Nutritional status F.  Physical activity G. Advance directives H. List of other physicians I.  Hospitalizations, surgeries, and ER visits in previous 12 months J.    Pax to include hearing, vision, cognitive, depression L. Referrals and appointments - none  In addition, I have reviewed and discussed with patient certain preventive protocols, quality metrics, and best practice recommendations. A written personalized care plan for preventive services as well as general preventive health recommendations were provided to patient.  See attached scanned questionnaire for additional information.   Signed,   Lindell Noe, MHA, BS, LPN Health Coach

## 2017-12-07 NOTE — Patient Instructions (Addendum)
Morgan [name] , Thank you for taking time to come for your Medicare Wellness Visit. I appreciate your ongoing commitment to your health goals. Please review the following plan we discussed and let me know if I can assist you in the future.   These are the goals we discussed: Goals    . Increase physical activity     When weather permits, I will attempt to walk at least 30 minutes 3 days per week.        This is a list of the screening recommended for you and due dates:  Health Maintenance  Topic Date Due  . Tetanus Vaccine  12/07/2018*  . Eye exam for diabetics  05/02/2018  . Complete foot exam   05/24/2018  . Hemoglobin A1C  06/06/2018  . Urine Protein Check  12/07/2018  . Colon Cancer Screening  10/14/2019  . Flu Shot  Completed  . DEXA scan (bone density measurement)  Completed  . Pneumonia vaccines  Completed  *Topic was postponed. The date shown is not the original due date.   Preventive Care for Adults  A healthy lifestyle and preventive care can promote health and wellness. Preventive health guidelines for adults include the following key practices.  . A routine yearly physical is a good way to check with your health care provider about your health and preventive screening. It is a chance to share any concerns and updates on your health and to receive a thorough exam.  . Visit your dentist for a routine exam and preventive care every 6 months. Brush your teeth twice a day and floss once a day. Good oral hygiene prevents tooth decay and gum disease.  . The frequency of eye exams is based on your age, health, family medical history, use  of contact lenses, and other factors. Follow your health care provider's recommendations for frequency of eye exams.  . Eat a healthy diet. Foods like vegetables, fruits, whole grains, low-fat dairy products, and lean protein foods contain the nutrients you need without too many calories. Decrease your intake of foods high in solid fats,  added sugars, and salt. Eat the right amount of calories for you. Get information about a proper diet from your health care provider, if necessary.  . Regular physical exercise is one of the most important things you can do for your health. Most adults should get at least 150 minutes of moderate-intensity exercise (any activity that increases your heart rate and causes you to sweat) each week. In addition, most adults need muscle-strengthening exercises on 2 or more days a week.  Silver Sneakers may be a benefit available to you. To determine eligibility, you may visit the website: www.silversneakers.com or contact program at 708-518-4142 Mon-Fri between 8AM-8PM.   . Maintain a healthy weight. The body mass index (BMI) is a screening tool to identify possible weight problems. It provides an estimate of body fat based on height and weight. Your health care provider can find your BMI and can help you achieve or maintain a healthy weight.   For adults 20 years and older: ? A BMI below 18.5 is considered underweight. ? A BMI of 18.5 to 24.9 is normal. ? A BMI of 25 to 29.9 is considered overweight. ? A BMI of 30 and above is considered obese.   . Maintain normal blood lipids and cholesterol levels by exercising and minimizing your intake of saturated fat. Eat a balanced diet with plenty of fruit and vegetables. Blood tests for lipids and cholesterol  should begin at age 26 and be repeated every 5 years. If your lipid or cholesterol levels are high, you are over 50, or you are at high risk for heart disease, you may need your cholesterol levels checked more frequently. Ongoing high lipid and cholesterol levels should be treated with medicines if diet and exercise are not working.  . If you smoke, find out from your health care provider how to quit. If you do not use tobacco, please do not start.  . If you choose to drink alcohol, please do not consume more than 2 drinks per day. One drink is  considered to be 12 ounces (355 mL) of beer, 5 ounces (148 mL) of wine, or 1.5 ounces (44 mL) of liquor.  . If you are 57-76 years old, ask your health care provider if you should take aspirin to prevent strokes.  . Use sunscreen. Apply sunscreen liberally and repeatedly throughout the day. You should seek shade when your shadow is shorter than you. Protect yourself by wearing long sleeves, pants, a wide-brimmed hat, and sunglasses year round, whenever you are outdoors.  . Once a month, do a whole body skin exam, using a mirror to look at the skin on your back. Tell your health care provider of new moles, moles that have irregular borders, moles that are larger than a pencil eraser, or moles that have changed in shape or color.

## 2017-12-20 ENCOUNTER — Ambulatory Visit (INDEPENDENT_AMBULATORY_CARE_PROVIDER_SITE_OTHER): Payer: Medicare Other | Admitting: Family Medicine

## 2017-12-20 ENCOUNTER — Other Ambulatory Visit: Payer: Self-pay | Admitting: Family Medicine

## 2017-12-20 ENCOUNTER — Other Ambulatory Visit: Payer: Self-pay

## 2017-12-20 ENCOUNTER — Encounter: Payer: Self-pay | Admitting: Family Medicine

## 2017-12-20 VITALS — BP 100/60 | HR 76 | Temp 98.1°F | Ht 62.25 in | Wt 147.5 lb

## 2017-12-20 DIAGNOSIS — E119 Type 2 diabetes mellitus without complications: Secondary | ICD-10-CM

## 2017-12-20 DIAGNOSIS — E7800 Pure hypercholesterolemia, unspecified: Secondary | ICD-10-CM | POA: Diagnosis not present

## 2017-12-20 DIAGNOSIS — I10 Essential (primary) hypertension: Secondary | ICD-10-CM | POA: Diagnosis not present

## 2017-12-20 DIAGNOSIS — F324 Major depressive disorder, single episode, in partial remission: Secondary | ICD-10-CM | POA: Diagnosis not present

## 2017-12-20 DIAGNOSIS — Z0000 Encounter for general adult medical examination without abnormal findings: Secondary | ICD-10-CM | POA: Diagnosis not present

## 2017-12-20 DIAGNOSIS — Z85820 Personal history of malignant melanoma of skin: Secondary | ICD-10-CM

## 2017-12-20 DIAGNOSIS — C434 Malignant melanoma of scalp and neck: Secondary | ICD-10-CM

## 2017-12-20 LAB — HM DIABETES FOOT EXAM

## 2017-12-20 MED ORDER — CLONAZEPAM 1 MG PO TABS: 2.0000 mg | ORAL_TABLET | Freq: Every day | ORAL | 1 refills | Status: DC

## 2017-12-20 MED ORDER — METFORMIN HCL ER 500 MG PO TB24: 1000.0000 mg | ORAL_TABLET | Freq: Two times a day (BID) | ORAL | 3 refills | Status: DC

## 2017-12-20 NOTE — Progress Notes (Signed)
Subjective:    Patient ID: Morgan [name], female    DOB: Mar 13, 1941, 77 y.o.   MRN: [id number]  HPI  The patient presents for  complete physical and review of chronic health problems.   The patient saw Candis Musa, LPN for medicare wellness. Note reviewed in detail and important notes copied below. Health maintenance: Tetanus vaccine - postponed/insurance A1C - completed Microalbumin - completed Abnormal screenings:  Depression score: 3 Depression screen [facility] 2/9 12/07/2017 11/14/2016 08/10/2015 07/23/2014  Decreased Interest 0 0 0 0  Down, Depressed, Hopeless 1 0 1 0  PHQ - 2 Score 1 0 1 0  Altered sleeping 0 - - -  Tired, decreased energy 0 - - -  Change in appetite 0 - - -  Feeling bad or failure about yourself  1 - - -  Trouble concentrating 0 - - -  Moving slowly or fidgety/restless 1 - - -  Suicidal thoughts 0 - - -  PHQ-9 Score 3 - - -  Difficult doing work/chores Not difficult at all - - -  Some recent data might be hidden  Patient concerns:  None  12/20/17 Today  Diabetes:   At goal on victoza and metformin Lab Results  Component Value Date   HGBA1C 6.5 12/07/2017  Using medications without difficulties: Hypoglycemic episodes: none Hyperglycemic episodes:none Feet problems: no ulcer Blood Sugars averaging: FBS 90-110 eye exam within last year: schedule next week Wt Readings from Last 3 Encounters:  12/20/17 147 lb 8 oz (66.9 kg)  12/07/17 147 lb 4 oz (66.8 kg)  10/02/17 153 lb (69.4 kg)  Body mass index is 26.76 kg/m.   Myrbetriq has helped with urge incontinence. No SE.  Depression, major recurrent: Stable control on venlafaxine.  Using clonazepam at bedtime for insomnia and for restless legs.  Malignant melanma removed for right neck  Hypertension:   Improvement in breathing with change from HCTZ to chlorthalidone  BP Readings from Last 3 Encounters:  12/20/17 100/60  12/07/17 120/70  10/02/17 130/82  Using medication without problems  or lightheadedness:  none Chest pain with exertion: none Edema: none Short of breath: none Average home BPs: Other issues:  Elevated Cholesterol:  At goal  < 70 on lipitor  CAD followed by Dr. Marlou Porch Lab Results  Component Value Date   CHOL 117 12/07/2017   HDL 45.90 12/07/2017   LDLCALC 54 12/07/2017   TRIG 88.0 12/07/2017   CHOLHDL 3 12/07/2017  Using medications without problems: Muscle aches:  Diet compliance: good Exercise: none Other complaints:   Social History /Family History/Past Medical History reviewed in detail and updated in EMR if needed. Blood pressure 100/60, pulse 76, temperature 98.1 F (36.7 C), temperature source Oral, height 5' 2.25" (1.581 m), weight 147 lb 8 oz (66.9 kg).    Review of Systems  Constitutional: Negative for fatigue and fever.  HENT: Negative for congestion.   Eyes: Negative for pain.  Respiratory: Negative for cough and shortness of breath.   Cardiovascular: Negative for chest pain, palpitations and leg swelling.  Gastrointestinal: Negative for abdominal pain.  Genitourinary: Negative for dysuria and vaginal bleeding.  Musculoskeletal: Negative for back pain.  Neurological: Negative for syncope, light-headedness and headaches.  Psychiatric/Behavioral: Negative for dysphoric mood.       Objective:   Physical Exam  Constitutional: Vital signs are normal. She appears well-developed and well-nourished. She is cooperative.  Non-toxic appearance. She does not appear ill. No distress.  HENT:  Head: Normocephalic.  Right Ear: Hearing, tympanic  membrane, external ear and ear canal normal.  Left Ear: Hearing, tympanic membrane, external ear and ear canal normal.  Nose: Nose normal.  Eyes: Conjunctivae, EOM and lids are normal. Pupils are equal, round, and reactive to light. Lids are everted and swept, no foreign bodies found.  Neck: Trachea normal and normal range of motion. Neck supple. Carotid bruit is not present. No thyroid mass and  no thyromegaly present.  Cardiovascular: Normal rate, regular rhythm, S1 normal, S2 normal, normal heart sounds and intact distal pulses. Exam reveals no gallop.  No murmur heard. Pulmonary/Chest: Effort normal and breath sounds normal. No respiratory distress. She has no wheezes. She has no rhonchi. She has no rales.  Abdominal: Soft. Normal appearance and bowel sounds are normal. She exhibits no distension, no fluid wave, no abdominal bruit and no mass. There is no hepatosplenomegaly. There is no tenderness. There is no rebound, no guarding and no CVA tenderness. No hernia.  Lymphadenopathy:    She has no cervical adenopathy.    She has no axillary adenopathy.  Neurological: She is alert. She has normal strength. No cranial nerve deficit or sensory deficit.  Skin: Skin is warm, dry and intact. No rash noted.  Psychiatric: Her speech is normal and behavior is normal. Judgment normal. Her mood appears not anxious. Cognition and memory are normal. She does not exhibit a depressed mood.      Diabetic foot exam: Normal inspection No skin breakdown No calluses  Normal DP pulses Normal sensation to light touch and monofilament Nails normal     Assessment & Plan:  The patient's preventative maintenance and recommended screening tests for an annual wellness exam were reviewed in full today. Brought up to date unless services declined.  Counselled on the importance of diet, exercise, and its role in overall health and mortality. The patient's FH and SH was reviewed, including their home life, tobacco status, and drug and alcohol status.   Vaccines: uptodate Pap/DVE:  No pap, DVE not indicated... Asymptomatic. No family history of uterine or ovarian cancer. Mammo: 09/2017, no yearly breast exam. Bone Density: 09/2014 osteopenia, repeat 5 years Colon:last 2015 . Sister with strong family history colon cancer, plan repeat 5 years Smoking Status:None

## 2017-12-20 NOTE — Assessment & Plan Note (Signed)
Well controlled. Continue current medication.  

## 2017-12-20 NOTE — Assessment & Plan Note (Signed)
Followed by derm. 

## 2017-12-20 NOTE — Assessment & Plan Note (Signed)
Stable control on venlafaxine and clonazepam prn

## 2017-12-20 NOTE — Patient Instructions (Addendum)
Follow BPs at home.. If remaining low or lightheaded.. Discuss decrease of BP medication/diuretic.  Work on increasing activity as your can.

## 2017-12-20 NOTE — Assessment & Plan Note (Signed)
At goal on metformin and victoza.

## 2018-02-14 ENCOUNTER — Other Ambulatory Visit: Payer: Self-pay | Admitting: Family Medicine

## 2018-03-05 ENCOUNTER — Other Ambulatory Visit: Payer: Self-pay | Admitting: Family Medicine

## 2018-03-05 NOTE — Telephone Encounter (Signed)
Last office visit 12/20/2017.  Last refilled 12/20/2017 for #60 with 1 refill.  Ok to refill?

## 2018-03-11 ENCOUNTER — Other Ambulatory Visit: Payer: Self-pay | Admitting: Family Medicine

## 2018-03-29 ENCOUNTER — Other Ambulatory Visit: Payer: Self-pay | Admitting: Family Medicine

## 2018-03-31 ENCOUNTER — Other Ambulatory Visit: Payer: Self-pay | Admitting: Family Medicine

## 2018-04-01 NOTE — Telephone Encounter (Signed)
Last office visit 12/20/2017.  Last refilled 10/08/2017 for #180 with 1 refill.  Ok to refill?

## 2018-04-20 ENCOUNTER — Other Ambulatory Visit: Payer: Self-pay | Admitting: Gastroenterology

## 2018-04-23 ENCOUNTER — Encounter: Payer: Self-pay | Admitting: Family Medicine

## 2018-04-23 ENCOUNTER — Encounter: Payer: Self-pay | Admitting: Cardiology

## 2018-04-23 ENCOUNTER — Encounter: Payer: Self-pay | Admitting: Gastroenterology

## 2018-04-25 ENCOUNTER — Telehealth: Payer: Self-pay | Admitting: Gastroenterology

## 2018-04-25 ENCOUNTER — Other Ambulatory Visit: Payer: Self-pay

## 2018-04-25 DIAGNOSIS — K219 Gastro-esophageal reflux disease without esophagitis: Secondary | ICD-10-CM

## 2018-04-25 DIAGNOSIS — R1310 Dysphagia, unspecified: Secondary | ICD-10-CM

## 2018-04-25 NOTE — Telephone Encounter (Signed)
Rescheduled her barium swallow to 6/20 at [facility], 0930. NPO 3 hours prior. Sent her a message to [place], also let her husband know by phone.

## 2018-05-02 ENCOUNTER — Ambulatory Visit (HOSPITAL_COMMUNITY)
Admission: RE | Admit: 2018-05-02 | Discharge: 2018-05-02 | Disposition: A | Discharge status: Home / Self Care | Payer: Medicare Other | Source: Ambulatory Visit | Attending: Gastroenterology | Admitting: Gastroenterology

## 2018-05-02 DIAGNOSIS — R1310 Dysphagia, unspecified: Secondary | ICD-10-CM | POA: Insufficient documentation

## 2018-05-02 DIAGNOSIS — K219 Gastro-esophageal reflux disease without esophagitis: Secondary | ICD-10-CM

## 2018-05-02 DIAGNOSIS — K449 Diaphragmatic hernia without obstruction or gangrene: Secondary | ICD-10-CM | POA: Diagnosis not present

## 2018-05-13 ENCOUNTER — Telehealth: Payer: Self-pay | Admitting: Gastroenterology

## 2018-05-20 ENCOUNTER — Other Ambulatory Visit: Payer: Self-pay

## 2018-05-20 MED ORDER — DEXLANSOPRAZOLE 60 MG PO CPDR: 60.0000 mg | DELAYED_RELEASE_CAPSULE | Freq: Every day | ORAL | 1 refills | Status: DC

## 2018-05-22 ENCOUNTER — Other Ambulatory Visit: Payer: Self-pay

## 2018-05-22 NOTE — Telephone Encounter (Signed)
Let patient know I sent the refill for her Dexilant on 7/8, she will contact her pharmacy and see if it is ready.

## 2018-05-22 NOTE — Telephone Encounter (Signed)
Pt states she is returning phone call to [place] regarding getting Dexilant refilled.

## 2018-05-23 ENCOUNTER — Other Ambulatory Visit: Payer: Self-pay | Admitting: Family Medicine

## 2018-05-23 NOTE — Telephone Encounter (Signed)
Name of Medication: Clonazepam Name of Pharmacy: [facility] Last Fill or Written Date and Quantity: 03/05/2018 for #60 with 1 refill Last Office Visit and Type: 12/20/2017 CPE Next Office Visit and Type: 06/20/2018 6 month f/u Last Controlled Substance Agreement Date: 08/03/2015 Last UDS: 02/03/2016

## 2018-05-24 ENCOUNTER — Other Ambulatory Visit: Payer: Self-pay | Admitting: *Deleted

## 2018-05-24 MED ORDER — ONETOUCH DELICA LANCETS 33G MISC: 3 refills | Status: DC

## 2018-06-01 ENCOUNTER — Other Ambulatory Visit: Payer: Self-pay | Admitting: Family Medicine

## 2018-06-06 LAB — HM DIABETES EYE EXAM

## 2018-06-09 LAB — HM DIABETES EYE EXAM

## 2018-06-17 ENCOUNTER — Other Ambulatory Visit (INDEPENDENT_AMBULATORY_CARE_PROVIDER_SITE_OTHER): Payer: Medicare Other

## 2018-06-17 ENCOUNTER — Telehealth: Payer: Self-pay | Admitting: Family Medicine

## 2018-06-17 DIAGNOSIS — E7800 Pure hypercholesterolemia, unspecified: Secondary | ICD-10-CM

## 2018-06-17 DIAGNOSIS — E119 Type 2 diabetes mellitus without complications: Secondary | ICD-10-CM

## 2018-06-17 LAB — COMPREHENSIVE METABOLIC PANEL
ALT: 19 U/L (ref 0–35)
AST: 19 U/L (ref 0–37)
Albumin: 4.2 g/dL (ref 3.5–5.2)
Alkaline Phosphatase: 64 U/L (ref 39–117)
BUN: 20 mg/dL (ref 6–23)
CALCIUM: 9.6 mg/dL (ref 8.4–10.5)
CHLORIDE: 101 meq/L (ref 96–112)
CO2: 35 meq/L — AB (ref 19–32)
CREATININE: 0.85 mg/dL (ref 0.40–1.20)
GFR: 69.07 mL/min (ref >60.00)
Glucose, Bld: 96 mg/dL (ref 70–99)
Potassium: 4 mEq/L (ref 3.5–5.1)
SODIUM: 143 meq/L (ref 135–145)
Total Bilirubin: 0.4 mg/dL (ref 0.2–1.2)
Total Protein: 6.3 g/dL (ref 6.0–8.3)

## 2018-06-17 LAB — LIPID PANEL
CHOL/HDL RATIO: 2
Cholesterol: 117 mg/dL (ref 0–200)
HDL: 49.5 mg/dL (ref >39.00)
LDL CALC: 53 mg/dL (ref 0–99)
NONHDL: 67.11
TRIGLYCERIDES: 73 mg/dL (ref 0.0–149.0)
VLDL: 14.6 mg/dL (ref 0.0–40.0)

## 2018-06-17 LAB — HEMOGLOBIN A1C: Hgb A1c MFr Bld: 6.3 % (ref 4.6–6.5)

## 2018-06-17 NOTE — Telephone Encounter (Signed)
-----   Message from Ellamae Sia sent at 06/10/2018  3:18 PM EDT ----- Regarding: Lab orders for Monday, 8.5.19 Lab orders for a 6 month follow up appt

## 2018-06-20 ENCOUNTER — Ambulatory Visit: Payer: Medicare Other | Admitting: Family Medicine

## 2018-06-20 ENCOUNTER — Encounter: Payer: Self-pay | Admitting: Family Medicine

## 2018-06-20 VITALS — BP 122/62 | HR 75 | Temp 97.8°F | Ht 62.25 in | Wt 150.0 lb

## 2018-06-20 DIAGNOSIS — I10 Essential (primary) hypertension: Secondary | ICD-10-CM | POA: Diagnosis not present

## 2018-06-20 DIAGNOSIS — E7800 Pure hypercholesterolemia, unspecified: Secondary | ICD-10-CM

## 2018-06-20 DIAGNOSIS — I480 Paroxysmal atrial fibrillation: Secondary | ICD-10-CM

## 2018-06-20 DIAGNOSIS — E119 Type 2 diabetes mellitus without complications: Secondary | ICD-10-CM

## 2018-06-20 LAB — HM DIABETES FOOT EXAM

## 2018-06-20 MED ORDER — LIRAGLUTIDE 18 MG/3ML ~~LOC~~ SOPN: 1.2000 mg | PEN_INJECTOR | Freq: Every day | SUBCUTANEOUS | 3 refills | Status: DC

## 2018-06-20 MED ORDER — NITROGLYCERIN 0.4 MG SL SUBL: 0.4000 mg | SUBLINGUAL_TABLET | SUBLINGUAL | 0 refills | Status: DC | PRN

## 2018-06-20 NOTE — Assessment & Plan Note (Signed)
Pt with weekly lows.. Decrease victoza to 1.2 mg daily. Continue metformin and healthy lifestyle.

## 2018-06-20 NOTE — Patient Instructions (Addendum)
Decrease victoza to 1.2 mg daily.  Follow fasting blood sugars.. Call IF FBS consistently >120, or post prandial.. > 200.

## 2018-06-20 NOTE — Assessment & Plan Note (Signed)
Well controlled. Continue current medication.  

## 2018-06-20 NOTE — Assessment & Plan Note (Signed)
Followed by cardiology Rate controlled in office today.

## 2018-06-20 NOTE — Progress Notes (Signed)
Subjective:    Patient ID: Morgan [name], female    DOB: 1941/07/12, 77 y.o.   MRN: [id number]  HPI  77 year old female presents for 6 month follow up.  Diabetes:   Excellent control on victoza and metformin. Lab Results  Component Value Date   HGBA1C 6.3 06/17/2018  Using medications without difficulties: Hypoglycemic episodes:  Occurring once weekly at 69 Hyperglycemic episodes: none Feet problems:no ulcers Blood Sugars averaging: FBS < 100 eye exam within last year:  Hx of CAD, TIA Wt Readings from Last 3 Encounters:  06/20/18 150 lb (68 kg)  12/20/17 147 lb 8 oz (66.9 kg)  12/07/17 147 lb 4 oz (66.8 kg)     Hypertension:  Good control on metoprolol,  cholthalidone Using medication without problems or lightheadedness: none Chest pain with exertion: none Edema:none Short of breath: improved with chlorthalidone Average home BPs: Other issues:  Elevated Cholesterol:  On atorvastatin 80 mg daily .[name] LDL at goal.  Cardiology Dr. Marlou Porch Lab Results  Component Value Date   CHOL 117 06/17/2018   HDL 49.50 06/17/2018   LDLCALC 53 06/17/2018   TRIG 73.0 06/17/2018   CHOLHDL 2 06/17/2018  Using medications without problems: Muscle aches:  Diet compliance: Exercise: Other complaints:  Social History /Family History/Past Medical History reviewed in detail and updated in EMR if needed. Blood pressure 122/62, pulse 75, temperature 97.8 F (36.6 C), temperature source Oral, height 5' 2.25" (1.581 m), weight 150 lb (68 kg), SpO2 97 %.    Review of Systems  Constitutional: Negative for fatigue and fever.  HENT: Negative for congestion.   Eyes: Positive for visual disturbance. Negative for pain.  Respiratory: Negative for cough and shortness of breath.   Cardiovascular: Negative for chest pain, palpitations and leg swelling.  Gastrointestinal: Negative for abdominal pain.  Genitourinary: Negative for dysuria and vaginal bleeding.  Musculoskeletal: Negative for back  pain.  Neurological: Negative for syncope, light-headedness and headaches.  Psychiatric/Behavioral: Negative for dysphoric mood.       Objective:   Physical Exam  Constitutional: Vital signs are normal. She appears well-developed and well-nourished. She is cooperative.  Non-toxic appearance. She does not appear ill. No distress.  HENT:  Head: Normocephalic.  Right Ear: Hearing, tympanic membrane, external ear and ear canal normal. Tympanic membrane is not erythematous, not retracted and not bulging.  Left Ear: Hearing, tympanic membrane, external ear and ear canal normal. Tympanic membrane is not erythematous, not retracted and not bulging.  Nose: No mucosal edema or rhinorrhea. Right sinus exhibits no maxillary sinus tenderness and no frontal sinus tenderness. Left sinus exhibits no maxillary sinus tenderness and no frontal sinus tenderness.  Mouth/Throat: Uvula is midline, oropharynx is clear and moist and mucous membranes are normal.  Eyes: Pupils are equal, round, and reactive to light. Conjunctivae, EOM and lids are normal. Lids are everted and swept, no foreign bodies found.  Neck: Trachea normal and normal range of motion. Neck supple. Carotid bruit is not present. No thyroid mass and no thyromegaly present.  Cardiovascular: Normal rate, regular rhythm, S1 normal, S2 normal, normal heart sounds, intact distal pulses and normal pulses. Exam reveals no gallop and no friction rub.  No murmur heard. Pulmonary/Chest: Effort normal and breath sounds normal. No tachypnea. No respiratory distress. She has no decreased breath sounds. She has no wheezes. She has no rhonchi. She has no rales.  Abdominal: Soft. Normal appearance and bowel sounds are normal. There is no tenderness.  Neurological: She is alert.  Skin:  Skin is warm, dry and intact. No rash noted.  Psychiatric: Her speech is normal and behavior is normal. Judgment and thought content normal. Her mood appears not anxious. Cognition and  memory are normal. She does not exhibit a depressed mood.   Diabetic foot exam: Normal inspection No skin breakdown No calluses  Normal DP pulses Normal sensation to light touch and monofilament Nails normal      Assessment & Plan:

## 2018-07-14 ENCOUNTER — Other Ambulatory Visit: Payer: Self-pay | Admitting: Family Medicine

## 2018-08-05 ENCOUNTER — Encounter: Payer: Self-pay | Admitting: Family Medicine

## 2018-08-05 ENCOUNTER — Ambulatory Visit: Payer: Medicare Other | Admitting: Family Medicine

## 2018-08-05 ENCOUNTER — Ambulatory Visit (INDEPENDENT_AMBULATORY_CARE_PROVIDER_SITE_OTHER)
Admission: RE | Admit: 2018-08-05 | Discharge: 2018-08-05 | Disposition: A | Discharge status: Home / Self Care | Payer: Medicare Other | Source: Ambulatory Visit | Attending: Family Medicine | Admitting: Family Medicine

## 2018-08-05 VITALS — BP 110/60 | HR 72 | Temp 98.3°F | Ht 62.25 in | Wt 151.0 lb

## 2018-08-05 DIAGNOSIS — M25511 Pain in right shoulder: Secondary | ICD-10-CM | POA: Diagnosis not present

## 2018-08-05 DIAGNOSIS — M898X2 Other specified disorders of bone, upper arm: Secondary | ICD-10-CM

## 2018-08-05 DIAGNOSIS — M542 Cervicalgia: Secondary | ICD-10-CM

## 2018-08-05 DIAGNOSIS — M79621 Pain in right upper arm: Secondary | ICD-10-CM | POA: Diagnosis not present

## 2018-08-05 MED ORDER — TIZANIDINE HCL 4 MG PO TABS: 4.0000 mg | ORAL_TABLET | Freq: Every evening | ORAL | 1 refills | Status: DC

## 2018-08-05 NOTE — Progress Notes (Signed)
Dr. Frederico Hamman T. Daphyne Miguez, MD, [facility] [place], [address] Phone: [phone] Fax: [phone]  08/05/2018  Patient: Morgan [name], MRN: [id number], DOB: 17-Sep-1941, 77 y.o.  Primary Physician:  Jinny Sanders, MD   Chief Complaint  Patient presents with  . Fall    Saturday Morning  . Shoulder Pain    Right-Trouble lifting arm  . Neck Pain   Subjective:   Morgan [name] is a 77 y.o. very pleasant female patient who presents with the following:  Carrying a folding table and fell and arm was under the handle got stuck between the door and the table. Tried to get up and may have pulled her arm in some way.  Slid down the table and shoulder are hurting really bad and neck and shoulder are hurting a lot.   Abduction is limited quite a bit. Abd to about 80. Better today than yesterday. Some bruising in the upper shoulder.   Shoulder and shoulder blade and neck are hurting a lot on Saturday night.   Took 1200 mg of Alleve. Took some alleve,   Past Medical History, Surgical History, Social History, Family History, Problem List, Medications, and Allergies have been reviewed and updated if relevant.  Patient Active Problem List   Diagnosis Date Noted  . Malignant melanoma ([place]) 11/23/2016  . Urge incontinence 11/23/2016  . Atypical mole 09/22/2016  . Slurring of speech 05/12/2016  . Traumatic arthritis elbow 05/04/2015  . Left foot pain 12/31/2014  . Severe sprain of left ankle 12/31/2014  . Osteopenia 09/15/2014  . History of TIA (transient ischemic attack) 05/19/2014  . Fracture of coronoid process of ulna, left, closed 04/25/2014  . Ingrown toenail 01/15/2014  . Obesity (BMI 30-39.9) 10/08/2013  . OA (osteoarthritis) of knee 07/07/2013  . Chronic low back pain 05/09/2013  . Obstructive sleep apnea 05/09/2013  . GERD (gastroesophageal reflux disease) 05/09/2013  . Hiatal hernia 05/09/2013  . Fibromyalgia  syndrome 05/09/2013  . 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 RLS (restless legs syndrome) 05/09/2013  . Diabetes mellitus with no complication ([facility]) 90/24/0973  . High cholesterol 05/09/2013  . 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 Family history of colon cancer 05/09/2013  . Allergic rhinitis 05/09/2013  . Lateral meniscal tear 09/04/2012  . CAD (coronary artery disease) 09/27/2011  . HTN (hypertension) 09/27/2011  . Paroxysmal A-fib ([facility]) 09/27/2011    Past Medical History:  Diagnosis Date  . Angina   . Arthritis   . Atrial fibrillation ([facility])    ASPIRIN FOR BLOOD THINNER  . Atypical mole 09/22/2016  . Bulging discs    lumbar   . Cancer ([place])    melanoma  . Complication of anesthesia    pt states has choking sensation with ET tube   . Coronary artery disease   . Depression   . Diabetes mellitus    diet controlled/on meds  . Fibromyalgia   . GERD (gastroesophageal reflux disease)   . H/O hiatal hernia   . Headache(784.0)    "recurring"  . High cholesterol   . Hyperlipidemia   . Hypertension   . 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 Migraines    "til ~ 1980"  . PONV (postoperative nausea and vomiting)   . Restless leg syndrome   . Sciatic nerve pain    "from pinched nerve"  . Sleep apnea    uses CPAP  . Weakness of right side of body    "  I've had PT for it; they don't know what it's from".  CORTISONE INJECTION INTO BACK 08/30/12    Past Surgical History:  Procedure Laterality Date  . [place] STUDY N/A 12/29/2015   Procedure: [place] STUDY;  Surgeon: Manus Gunning, MD;  Location: [facility];  Service: Gastroenterology;  Laterality: N/A;  . CARPAL TUNNEL RELEASE Left 07/02/2015   Procedure: CARPAL TUNNEL RELEASE;  Surgeon: Frederik Pear, MD;  Location: [facility];  Service: Orthopedics;  Laterality: Left;  . CATARACT EXTRACTION, BILATERAL Bilateral    Oct and Nov 2017  . CORONARY ANGIOPLASTY WITH STENT PLACEMENT  06/2011    "1"  . CORONARY ARTERY BYPASS GRAFT  2005   CABG X 2  . DILATION AND CURETTAGE OF UTERUS     "more than once"  . ELBOW ARTHROSCOPY Left 07/02/2015   Procedure: ARTHROSCOPY LEFT ELBOW WITH DEBRIDEMENT AND REMOVAL LOOSE BODY;  Surgeon: Frederik Pear, MD;  Location: [facility];  Service: Orthopedics;  Laterality: Left;  . ESOPHAGEAL MANOMETRY N/A 12/29/2015   Procedure: ESOPHAGEAL MANOMETRY (EM);  Surgeon: Manus Gunning, MD;  Location: [facility];  Service: Gastroenterology;  Laterality: N/A;  . FRACTURE SURGERY  ~ 2005   nose  . KNEE ARTHROSCOPY  09/04/2012   Procedure: ARTHROSCOPY KNEE;  Surgeon: Gearlean Alf, MD;  Location: [facility];  Service: Orthopedics;  Laterality: Right;  right knee arthroscopy with medial and lateral meniscus debridement  . 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 MOUTH SURGERY  2004   "bone replacement; had cadavear bones put in; face was collapsing"  . NASAL SEPTUM SURGERY  ~ 1986  . TOTAL KNEE ARTHROPLASTY Right 07/07/2013   Procedure: RIGHT TOTAL KNEE ARTHROPLASTY;  Surgeon: Gearlean Alf, MD;  Location: [facility];  Service: Orthopedics;  Laterality: Right;    Social History   Socioeconomic History  . Marital status: Married    Spouse name: Not on file  . Number of children: 2  . Years of education: college  . Highest education level: Not on file  Occupational History  . Occupation: Retired   Scientific laboratory technician  . Financial resource strain: Not on file  . Food insecurity:    Worry: Not on file    Inability: Not on file  . Transportation needs:    Medical: Not on file    Non-medical: Not on file  Tobacco Use  . Smoking status: Never Smoker  . Smokeless tobacco: Never Used  Substance and Sexual Activity  . Alcohol use: Yes    Alcohol/week: 1.0 standard drinks    Types: 1 Glasses of wine per week    Comment: "occasionally drink wine"  . Drug use: No  . Sexual activity: Yes  Lifestyle  . Physical activity:     Days per week: Not on file    Minutes per session: Not on file  . Stress: Not on file  Relationships  . Social connections:    Talks on phone: Not on file    Gets together: Not on file    Attends religious service: Not on file    Active member of club or organization: Not on file    Attends meetings of clubs or organizations: Not on file    Relationship status: Not on file  . Intimate partner violence:    Fear of current or ex partner: Not on file    Emotionally abused: Not on file    Physically abused:  Not on file    Forced sexual activity: Not on file  Other Topics Concern  . Not on file  Social History Narrative   Drinks 1 cup of coffee a day     Family History  Problem Relation Age of Onset  . Cancer Mother 67       breast cancer  . Breast cancer Mother 2  . Heart disease Father 48       sudden onset due to CAD  . Cancer Sister 9       colon  . Hypertension Sister   . Dementia Sister   . Diabetes Sister   . Colon cancer Sister   . GER disease Daughter   . Hypertension Daughter   . Heart disease Brother   . Hypertension Brother   . Heart attack Neg Hx   . Stroke Neg Hx     Allergies  Allergen Reactions  . Hydrocodone-Acetaminophen Other (See Comments)    "Changed personality" "made me very mean"  . Percocet [Oxycodone-Acetaminophen] Other (See Comments)    hallucination  . Sulfa Antibiotics Other (See Comments)    hallucinations    Medication list reviewed and updated in full in [facility].  GEN: No fevers, chills. Nontoxic. Primarily MSK c/o today. MSK: Detailed in the HPI GI: tolerating PO intake without difficulty Neuro: No numbness, parasthesias, or tingling associated. Otherwise the pertinent positives of the ROS are noted above.   Objective:   BP 110/60   Pulse 72   Temp 98.3 F (36.8 C) (Oral)   Ht 5' 2.25" (1.581 m)   Wt 151 lb (68.5 kg)   BMI 27.40 kg/m    GEN: WDWN, NAD, Non-toxic, Alert & Oriented x 3 HEENT: Atraumatic,  Normocephalic.  Ears and Nose: No external deformity. EXTR: No clubbing/cyanosis/edema NEURO: Normal gait.  PSYCH: Normally interactive. Conversant. Not depressed or anxious appearing.  Calm demeanor.    Cervical spine, the patient has an approximate 35% loss of motion in all directions.  She has no tenderness along her spinous processes in the cervical spine region.  Nontender along the clavicle and nontender at the [facility] joint.  She does have some tenderness in the proximal humerus region, there is also some bruising in the proximal and midshaft humerus.  Flexion and extension at the elbow are intact and not tender.  She is able to abduct to approximately 80 degrees only.  Strength testing in abduction is 4/5.  All other strength testing is 4+/5.  Radiology: No results found.  Assessment and Plan:   Pain of right humerus - Plan: DG Humerus Right  Acute pain of right shoulder - Plan: DG Humerus Right  Cervicalgia  Traumatic fall, no clear fracture.  Cannot exclude rotator cuff tear.  Work on basic range of motion and pendulums for now, and then recheck in 3 weeks.  Aleve plus Zanaflex at night.  Follow-up: Return in about 3 weeks (around 08/26/2018).  Meds ordered this encounter  Medications  . tiZANidine (ZANAFLEX) 4 MG tablet    Sig: Take 1 tablet (4 mg total) by mouth Nightly.    Dispense:  30 tablet    Refill:  1   Orders Placed This Encounter  Procedures  . DG Humerus Right    Signed,  Frederico Hamman T. Avari Gelles, MD   Allergies as of 08/05/2018      Reactions   Hydrocodone-acetaminophen Other (See Comments)   "Changed personality" "made me very mean"   Percocet [oxycodone-acetaminophen] Other (See Comments)  hallucination   Sulfa Antibiotics Other (See Comments)   hallucinations      Medication List        Accurate as of 08/05/18  2:05 PM. Always use your most recent med list.          aspirin 325 MG tablet Take 325 mg by mouth daily.   atorvastatin  80 MG tablet Commonly known as:  LIPITOR TAKE 1 TABLET BY MOUTH EVERY DAY   CARAFATE 1 GM/10ML suspension Generic drug:  sucralfate TAKE 10 MLS (1 G TOTAL) BY MOUTH EVERY 6 (SIX) HOURS AS NEEDED.   cetirizine 10 MG tablet Commonly known as:  ZYRTEC Take 10 mg by mouth daily. Reported on 12/23/2015   chlorthalidone 25 MG tablet Commonly known as:  HYGROTON Take 1 tablet (25 mg total) by mouth daily.   clonazePAM 1 MG tablet Commonly known as:  KLONOPIN TAKE 2 TABLETS (2 MG TOTAL) BY MOUTH AT BEDTIME.   dexlansoprazole 60 MG capsule Commonly known as:  DEXILANT Take 1 capsule (60 mg total) by mouth daily.   gabapentin 300 MG capsule Commonly known as:  NEURONTIN TAKE 1 TO 2 CAPSULES BY MOUTH AT BEDTIME   Insulin Pen Needle 32G X 6 MM Misc Use to inject Victoza daily.  Dx: E11.9   liraglutide 18 MG/3ML Sopn Commonly known as:  VICTOZA Inject 0.2 mLs (1.2 mg total) into the skin daily.   meloxicam 15 MG tablet Commonly known as:  MOBIC Take 15 mg by mouth daily. Reported on 12/23/2015   metFORMIN 500 MG 24 hr tablet Commonly known as:  GLUCOPHAGE-XR Take 2 tablets (1,000 mg total) by mouth 2 (two) times daily.   metoprolol succinate 100 MG 24 hr tablet Commonly known as:  TOPROL-XL TAKE 1 TABLET (100 MG TOTAL) BY MOUTH DAILY. TAKE WITH OR IMMEDIATELY FOLLOWING A MEAL.   MYRBETRIQ 25 MG Tb24 tablet Generic drug:  mirabegron ER TAKE 1 TABLET BY MOUTH EVERY DAY   nitroGLYCERIN 0.4 MG SL tablet Commonly known as:  NITROSTAT PLACE 1 TABLET (0.4 MG TOTAL) UNDER THE TONGUE EVERY 5 (FIVE) MINUTES AS NEEDED FOR CHEST PAIN   ONE TOUCH ULTRA TEST test strip Generic drug:  glucose blood CHECK BLOOD SUGAR TWICE A DAY   ONETOUCH DELICA LANCETS 16W Misc Use to check blood sugar once daily.  Dx: E11.9   PRESERVISION AREDS PO Take 1 tablet by mouth 2 (two) times daily. Reported on 12/23/2015   PROBIOTIC DAILY PO Take 1 tablet by mouth daily.   tiZANidine 4 MG tablet Commonly  known as:  ZANAFLEX Take 1 tablet (4 mg total) by mouth Nightly.   venlafaxine XR 37.5 MG 24 hr capsule Commonly known as:  EFFEXOR-XR TAKE 1 CAPSULE (37.5 MG TOTAL) BY MOUTH 2 (TWO) TIMES DAILY.

## 2018-08-13 ENCOUNTER — Other Ambulatory Visit: Payer: Self-pay | Admitting: Family Medicine

## 2018-08-19 ENCOUNTER — Other Ambulatory Visit: Payer: Self-pay | Admitting: Family Medicine

## 2018-08-19 NOTE — Telephone Encounter (Signed)
Last office visit 08/05/2018 with Dr. Lorelei Pont for Pain of right humerus. Next appointment 08/28/2018 to follow up with Dr. Lorelei Pont.  Last refilled 05/23/2018 for #60 with 1 refill.

## 2018-08-22 ENCOUNTER — Telehealth: Payer: Self-pay | Admitting: Radiology

## 2018-08-22 NOTE — Telephone Encounter (Signed)
Copied from [facility] [place] [id number]. Topic: General - Other >> Aug 22, 2018  9:14 AM Yvette Rack wrote: Reason for CRM: pt calling wanting to pick up a copy of her last xrays on 08-05-18  Disc is ready to be picked up

## 2018-08-25 ENCOUNTER — Other Ambulatory Visit: Payer: Self-pay | Admitting: Family Medicine

## 2018-08-28 ENCOUNTER — Ambulatory Visit: Payer: Medicare Other | Admitting: Family Medicine

## 2018-08-29 ENCOUNTER — Other Ambulatory Visit: Payer: Self-pay | Admitting: Gastroenterology

## 2018-09-01 ENCOUNTER — Other Ambulatory Visit: Payer: Self-pay | Admitting: Family Medicine

## 2018-09-21 ENCOUNTER — Other Ambulatory Visit: Payer: Self-pay | Admitting: Family Medicine

## 2018-09-27 ENCOUNTER — Other Ambulatory Visit: Payer: Self-pay | Admitting: Family Medicine

## 2018-09-30 ENCOUNTER — Ambulatory Visit: Payer: Medicare Other | Attending: Orthopedic Surgery

## 2018-09-30 DIAGNOSIS — M25511 Pain in right shoulder: Secondary | ICD-10-CM

## 2018-09-30 DIAGNOSIS — M6281 Muscle weakness (generalized): Secondary | ICD-10-CM | POA: Insufficient documentation

## 2018-09-30 DIAGNOSIS — M25611 Stiffness of right shoulder, not elsewhere classified: Secondary | ICD-10-CM | POA: Insufficient documentation

## 2018-09-30 NOTE — Patient Instructions (Signed)
Access Code: [email or web address]  URL: [email or web address]  Date: 09/30/2018  Prepared by: Roxana Hires   Exercises  Seated Cervical Retraction - 10 reps - 2 sets - 5 seconds hold - 2x daily - 7x weekly  Seated Scapular Retraction - 10 reps - 2 sets - 5 seconds hold - 2x daily - 7x weekly

## 2018-09-30 NOTE — Therapy (Signed)
[place] [facility] SERVICES [address] [place], [place], [address] Phone: [phone]   Fax:  [phone]  Physical Therapy Evaluation  Patient Details  Name: Morgan [name] MRN: [id number] Date of Birth: [birth year] Referring Provider (PT): Dr. Mayer Camel   Encounter Date: 09/30/2018  PT End of Session - 09/30/18 1015    Visit Number  1    Number of Visits  17    Date for PT Re-Evaluation  11/25/18    Authorization Type  Last goals: 09/30/18    PT Start Time  0840    PT Stop Time  0930    PT Time Calculation (min)  50 min    Activity Tolerance  Patient limited by pain    Behavior During Therapy  [facility] for tasks assessed/performed       Past Medical History:  Diagnosis Date  . Angina   . Arthritis   . Atrial fibrillation (HCC)    ASPIRIN FOR BLOOD THINNER  . Atypical mole 09/22/2016  . Bulging discs    lumbar   . Cancer ([place])    melanoma  . Complication of anesthesia    pt states has choking sensation with ET tube   . Coronary artery disease   . Depression   . Diabetes mellitus    diet controlled/on meds  . Fibromyalgia   . GERD (gastroesophageal reflux disease)   . H/O hiatal hernia   . Headache(784.0)    "recurring"  . High cholesterol   . Hyperlipidemia   . Hypertension   . 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 Migraines    "til ~ 1980"  . PONV (postoperative nausea and vomiting)   . Restless leg syndrome   . Sciatic nerve pain    "from pinched nerve"  . Sleep apnea    uses CPAP  . Weakness of right side of body    "I've had PT for it; they don't know what it's from".  CORTISONE INJECTION INTO BACK 08/30/12    Past Surgical History:  Procedure Laterality Date  . [place] STUDY N/A 12/29/2015   Procedure: [place] STUDY;  Surgeon: Manus Gunning, MD;  Location: [facility];  Service: Gastroenterology;  Laterality: N/A;  . CARPAL TUNNEL RELEASE Left 07/02/2015   Procedure: CARPAL TUNNEL RELEASE;  Surgeon: Frederik Pear, MD;  Location: [place];  Service: Orthopedics;  Laterality: Left;  . CATARACT EXTRACTION, BILATERAL Bilateral    Oct and Nov 2017  . CORONARY ANGIOPLASTY WITH STENT PLACEMENT  06/2011   "1"  . CORONARY ARTERY BYPASS GRAFT  2005   CABG X 2  . DILATION AND CURETTAGE OF UTERUS     "more than once"  . ELBOW ARTHROSCOPY Left 07/02/2015   Procedure: ARTHROSCOPY LEFT ELBOW WITH DEBRIDEMENT AND REMOVAL LOOSE BODY;  Surgeon: Frederik Pear, MD;  Location: [place];  Service: Orthopedics;  Laterality: Left;  . ESOPHAGEAL MANOMETRY N/A 12/29/2015   Procedure: ESOPHAGEAL MANOMETRY (EM);  Surgeon: Manus Gunning, MD;  Location: [facility];  Service: Gastroenterology;  Laterality: N/A;  . FRACTURE SURGERY  ~ 2005   nose  . KNEE ARTHROSCOPY  09/04/2012   Procedure: ARTHROSCOPY KNEE;  Surgeon: Gearlean Alf, MD;  Location: [facility];  Service: Orthopedics;  Laterality: Right;  right knee arthroscopy with medial and lateral meniscus debridement  . 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 MOUTH SURGERY  2004   "bone replacement; had cadavear  bones put in; face was collapsing"  . NASAL SEPTUM SURGERY  ~ 1986  . TOTAL KNEE ARTHROPLASTY Right 07/07/2013   Procedure: RIGHT TOTAL KNEE ARTHROPLASTY;  Surgeon: Gearlean Alf, MD;  Location: [facility];  Service: Orthopedics;  Laterality: Right;    There were no vitals filed for this visit.   Subjective Assessment - 09/30/18 1011    Subjective  R shoulder fracture    Pertinent History  On 08/03/18 pt was carrying a folding table and fell and right arm was under the handle got stuck between the door and the table. She landed on her R side. Tried to get up and had to pull at her arm. Pt had immediate pain in her right shoulder. She waiteduntil Monday and went to see Dr. Edilia Bo her PCP. He took radiographs but didn't find any fractures. She continued to have pain so she called Dr. Damita Dunnings office, her  orthopedist, and saw him that same week. He took additional radiographs and found a hairline fracture in her humeral shaft and a greater tuberosity fracture per patient report. She states that she is not healing well and has recurrent plain film radiographs. She is still wearing a sling and was encouraged not to perform any AROM of her right shoulder. Since her last visit with Dr. Mayer Camel on 09/20/18 she has had an additional fall and landed on her R shoulder. She reports additional bruising and pain in her R shoulder. She has a follow-up appt scheduled with her orthopedist for 10/15/18. 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 ROS negative for red flags.    Limitations  Lifting    Diagnostic tests  Plain film radiographs with decreased healing per pt report    Patient Stated Goals  Improve ability to use RUE and decrease pain    Currently in Pain?  Yes    Pain Score  3    Worst: 9/10, Best: 1/10   Pain Location  Shoulder    Pain Orientation  Right    Pain Descriptors / Indicators  Sharp    Pain Type  Acute pain    Pain Radiating Towards  down the right arm to the hand    Pain Onset  1 to 4 weeks ago    Pain Frequency  Constant    Aggravating Factors   Active movement    Pain Relieving Factors  Tramadol, rest, sleeping in a recliner, ice        SUBJECTIVE Chief complaint: On 08/03/18 pt was carrying a folding table and fell and right arm was under the handle got stuck between the door and the table. She landed on her R side. Tried to get up and had to pull at her arm. Pt had immediate pain in her right shoulder. She waiteduntil Monday and went to see Dr. Edilia Bo her PCP. He took radiographs but didn't find any fractures. She continued to have pain so she called Dr. Damita Dunnings office, her orthopedist, and saw him that same week. He took additional radiographs and found a hairline fracture in her humeral shaft and a greater tuberosity fracture per patient report. She states  that she is not healing well and has recurrent plain film radiographs. She is still wearing a sling and was encouraged not to perform any AROM of her right shoulder. Since her last visit with Dr. Mayer Camel on 09/20/18 she has had an additional fall and landed on her R shoulder. She reports additional bruising and  pain in her R shoulder. She has a follow-up appt scheduled with her orthopedist for 10/15/18. 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 ROS negative for red flags. Onset: 08/03/18 Shoulder trauma: Yes  Pain quality: sharp Pain: 3/10 Present, 1/10 Best, 9/10 Worst Aggravating factors: active movement Easing factors: Tramadol, rest, sleeping in a recliner, ice 24 hour pain behavior: Pain is worse in the evening/night. Pain wakes her up at night and pt is sleeping in a recliner. Radiating pain: Yes, down the right arm to the hand Numbness/Tingling: No Prior history of shoulder injury or pain: No Prior history of therapy for shoulder: No Follow-up appointment with MD: Yes, 10/15/18 Dominant hand: Right  OBJECTIVE  MUSCULOSKELETAL: Tremor: Normal Bulk: Normal Tone: Normal  Cervical Screen AROM: Limited extension otherwise cervical AROM is grossly WFL. Pain with overpressure during flexion, extension, and bilateral rotation. No pain with AROM and OP for lateral flexion.  Elbow Screen Elbow PROM: Within Normal Limits  Strength R Shoulder and elbow strength deferred due to protocol stating PROM only. R grip strength is intact.   Strength R/L NT/5 Shoulder flexion (anterior deltoid/pec major/coracobrachialis, axillary n. (C5-6) and musculocutaneous n. (C5-7)) NT/4+ Shoulder abduction (deltoid/supraspinatus, axillary/suprascapular n, C5) NT/4 Shoulder external rotation (infraspinatus/teres minor) NT/4 Shoulder internal rotation (subcapularis/lats/pec major) NT/5 Elbow flexion (biceps brachii, brachialis, brachioradialis, musculoskeletal n, C5-6) NT/5 Elbow  extension (triceps, radial n, C7) 5/5 Wrist Extension 5/5 Wrist Flexion 5/5 Finger adduction (interossei, ulnar n, T1) NT= not tested  AROM L shoulder AROM grossly WFL in all planes. R shoulder AROM deferred secondary to protocol   PROM R/L 129/150 Shoulder flexion 100/165 Shoulder abduction 50/93 Shoulder external rotation 50/70 Shoulder internal rotation  Passive accessory mobility testing deferred on this date for GH, AC, [place], and scapulothoracic mobility.  NEUROLOGICAL:  Mental Status Patient is oriented to person, place and time.  Recent memory is intact.  Remote memory is intact.  Attention span and concentration are intact.  Expressive speech is intact.  Patient's fund of knowledge is within normal limits for educational level.  Cranial Nerves: Deferred on this date  Sensation Grossly intact to light touch bilateral UE as determined by testing dermatomes C2-T2 Proprioception and hot/cold testing deferred on this date  Reflexes Deferred on this date  Coordination/Cerebellar Deferred on this date  Special Tests: Special testing deferred on this date   Outcome Measures Quick DASH:  84.1%        [facility] PT Assessment - 09/30/18 0910      Assessment   Medical Diagnosis  R greater tuberosity fracture    Referring Provider (PT)  Dr. Mayer Camel    Onset Date/Surgical Date  08/03/18    Hand Dominance  Right    Next MD Visit  10/15/18    Prior Therapy  Yes for weakness and TKR      Precautions   Precautions  Fall      Restrictions   Weight Bearing Restrictions  Yes    RUE Weight Bearing  Non weight bearing    Other Position/Activity Restrictions  In sling except for bathing/dressing      Balance Screen   Has the patient fallen in the past 6 months  Yes    How many times?  4-5 falls    Has the patient had a decrease in activity level because of a fear of falling?   Yes    Is the patient reluctant to leave their home because of a fear of  falling?   No       Home Social worker  Private residence    Living Arrangements  Spouse/significant other    Available Help at Discharge  Family    Type of [place] Access  Level entry    [place] - single point;Walker - 2 wheels;Shower seat - built in;Grab bars - tub/shower      Prior Function   Level of Independence  Independent with basic ADLs    Vocation  Retired    Secondary school teacher, puzzles, reading      Cognition   Overall Cognitive Status  Within Functional Limits for tasks assessed      Observation/Other Assessments   Other Surveys   Other Surveys    Quick DASH   84.1%                Objective measurements completed on examination: See above findings.        Pt issued cervical and scapular retractions for HEP on this date. She performed 10 reps of each exercise with verbal and tactile cues for correction by therapist. Call placed and message left for orthopedist regarding recent fall onto R shoulder since her last appointment.       PT Education - 09/30/18 1015    Education Details  Plan of care, HEP    Person(s) Educated  Patient    Methods  Explanation    Comprehension  Verbalized understanding       PT Short Term Goals - 09/30/18 1021      PT SHORT TERM GOAL #1   Title  Pt will be independent with HEP in order to improve strength and decrease pain in order to improve pain-free function at home.    Time  4    Period  Weeks    Status  New    Target Date  10/28/18        PT Long Term Goals - 09/30/18 1027      PT LONG TERM GOAL #1   Title  Pt will decrease quick DASH score by at least 8% in order to demonstrate clinically significant reduction in disability.    Baseline  09/30/18: 84.1%    Time  8    Period  Weeks    Status  New    Target Date  11/25/18      PT LONG TERM GOAL #2   Title  Pt will decrease worst pain as reported on NPRS by at least 3 points in order to demonstrate  clinically significant reduction in pain.    Baseline  09/30/18: worst: 9/10    Time  8    Period  Weeks    Status  New    Target Date  11/25/18      PT LONG TERM GOAL #3   Title  Pt will improve PROM of R shoulder to within 10 degrees of R shoulder in order to improve functional use of R shoulder for ADLs/IADLs.    Baseline  09/30/18: R/L 129/150 Shoulder flexion, 100/165 Shoulder abduction, 50/93 Shoulder external rotation, 50/70 Shoulder internal rotation    Time  8    Period  Weeks    Status  New    Target Date  11/25/18             Plan - 09/30/18 1016    Clinical Impression Statement  Pt is a pleasant 77 year-old female referred for R shoulder pain s/p fall. Pt was seen by her orthopedist and states that he found a hairline fracture in her humeral shaft as well as a fracture of her greater tuberosity. She states that she is not healing well as evidenced by recurrent plain film radiographs. She is still wearing a sling and was encouraged not to perform any AROM of her right shoulder. Her last visit with her orthopedist was 09/20/18 and pt has sufferred another fall onto her R shoulder since that time. Pt reports increased pain and bruising although no bruising observed of mid upper arm today. Unable truly visualize shoulder due to clothing. PT examination reveals deficits in R shoulder PROM compared to L side. Pt currently unable to perform AROM of R shoulder per MD protocol so strength unable to be assessed. However she has full R elbow passive flexion and extension and full active wrist flexion and extension. R grip strength intact and pt reports normal sensation to light touch throughout RUE. Pt will benefit from PT services to address deficits in strength, range of motion, and pain in order to return to full function at home with less shoulder pain.    History and Personal Factors relevant to plan of care:  Low: No personal factors/comorbidities, 2 or less body systems/activity  limitations/participation restrictions      Clinical Presentation  Unstable    Clinical Decision Making  Low    Rehab Potential  Good    PT Frequency  2x / week    PT Duration  8 weeks    PT Treatment/Interventions  ADLs/Self Care Home Management;Aquatic Therapy;Canalith Repostioning;Cryotherapy;Electrical Stimulation;Iontophoresis 4mg /ml Dexamethasone;Moist Heat;Traction;Ultrasound;Contrast Bath;DME Instruction;Gait training;Stair training;Functional mobility training;Therapeutic activities;Therapeutic exercise;Balance training;Neuromuscular re-education;Patient/family education;Manual techniques;Passive range of motion;Dry needling;Splinting;Taping;Vestibular    PT Next Visit Plan  Palpate R shoulder, review HEP and progress, Progress PROM of R shoulder    PT Home Exercise Plan  Cervical retractions, Scapular retractions    Consulted and Agree with Plan of Care  Patient       Patient will benefit from skilled therapeutic intervention in order to improve the following deficits and impairments:  Decreased strength, Decreased range of motion, Impaired UE functional use, Pain  Visit Diagnosis: Acute pain of right shoulder - Plan: PT plan of care cert/re-cert  Stiffness of right shoulder, not elsewhere classified - Plan: PT plan of care cert/re-cert  Muscle weakness (generalized) - Plan: PT plan of care cert/re-cert     Problem List Patient Active Problem List   Diagnosis Date Noted  . Malignant melanoma ([place]) 11/23/2016  . Urge incontinence 11/23/2016  . Atypical mole 09/22/2016  . Slurring of speech 05/12/2016  . Traumatic arthritis elbow 05/04/2015  . Left foot pain 12/31/2014  . Severe sprain of left ankle 12/31/2014  . Osteopenia 09/15/2014  . History of TIA (transient ischemic attack) 05/19/2014  . Fracture of coronoid process of ulna, left, closed 04/25/2014  . Ingrown toenail 01/15/2014  . Obesity (BMI 30-39.9) 10/08/2013  . OA (osteoarthritis) of knee 07/07/2013  .  Chronic low back pain 05/09/2013  . Obstructive sleep apnea 05/09/2013  . GERD (gastroesophageal reflux disease) 05/09/2013  . Hiatal hernia 05/09/2013  . Fibromyalgia syndrome 05/09/2013  . Depression, major, in partial remission ([place]) 05/09/2013  . RLS (restless legs syndrome) 05/09/2013  . Diabetes mellitus with no complication ([place]) 32/99/2426  . High cholesterol 05/09/2013  . 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 Family history of colon cancer 05/09/2013  . Allergic  rhinitis 05/09/2013  . Lateral meniscal tear 09/04/2012  . CAD (coronary artery disease) 09/27/2011  . HTN (hypertension) 09/27/2011  . Paroxysmal A-fib ([place]) 09/27/2011   Phillips Grout PT, DPT, GCS  Aiden Rao 09/30/2018, 10:38 AM  [place] MAIN [facility] SERVICES [address] [place], [place], [address] Phone: [phone]   Fax:  [phone]  Name: Morgan [name] MRN: [id number] Date of Birth: November 21, 1940

## 2018-10-01 ENCOUNTER — Ambulatory Visit: Payer: Medicare Other | Admitting: Cardiology

## 2018-10-01 ENCOUNTER — Encounter: Payer: Self-pay | Admitting: Cardiology

## 2018-10-01 VITALS — BP 130/70 | HR 71 | Ht 62.25 in | Wt 154.4 lb

## 2018-10-01 DIAGNOSIS — I2583 Coronary atherosclerosis due to lipid rich plaque: Secondary | ICD-10-CM

## 2018-10-01 DIAGNOSIS — I2510 Atherosclerotic heart disease of native coronary artery without angina pectoris: Secondary | ICD-10-CM | POA: Diagnosis not present

## 2018-10-01 DIAGNOSIS — I10 Essential (primary) hypertension: Secondary | ICD-10-CM | POA: Diagnosis not present

## 2018-10-01 DIAGNOSIS — R0602 Shortness of breath: Secondary | ICD-10-CM | POA: Diagnosis not present

## 2018-10-01 DIAGNOSIS — E7800 Pure hypercholesterolemia, unspecified: Secondary | ICD-10-CM

## 2018-10-01 MED ORDER — ASPIRIN EC 81 MG PO TBEC: 81.0000 mg | DELAYED_RELEASE_TABLET | Freq: Every day | ORAL | Status: AC

## 2018-10-01 NOTE — Patient Instructions (Signed)
Medication Instructions:  Please decrease your Aspirin to 81 mg a day. Continue all other medications as listed.  If you need a refill on your cardiac medications before your next appointment, please call your pharmacy.   Follow-Up: At [facility], you and your health needs are our priority.  As part of our continuing mission to provide you with exceptional heart care, we have created designated Provider Care Teams.  These Care Teams include your primary Cardiologist (physician) and Advanced Practice Providers (APPs -  Physician Assistants and Nurse Practitioners) who all work together to provide you with the care you need, when you need it. You will need a follow up appointment in 12 months.  Please call our office 2 months in advance to schedule this appointment.  You may see Dr Candee Furbish or one of the following Advanced Practice Providers on your designated Care Team:   Truitt Merle, NP Cecilie Kicks, NP . Kathyrn Drown, NP  Thank you for choosing [facility]!!

## 2018-10-01 NOTE — Progress Notes (Signed)
Cardiology Office Note:    Date:  10/01/2018   ID:  Morgan [name], DOB 1941/02/17, MRN [id number]  PCP:  Jinny Sanders, MD  Cardiologist:  Candee Furbish, MD  Electrophysiologist:  None   Referring MD: Jinny Sanders, MD     History of Present Illness:    Morgan [name] is a 77 y.o. female here for follow-up of coronary artery disease status post bypass 2006 with subsequent diagonal stent, diabetes, TIA/stroke cerebellar 2015 here for follow-up.  Angioedema as well.  Had a right posterior neck melanoma discovered by Dr. Radford Pax.  She was very thankful.  Fell. Right arm sling. PT. Fracture. MRI.   Past Medical History:  Diagnosis Date  . Angina   . Arthritis   . Atrial fibrillation (HCC)    ASPIRIN FOR BLOOD THINNER  . Atypical mole 09/22/2016  . Bulging discs    lumbar   . Cancer ([place])    melanoma  . Complication of anesthesia    pt states has choking sensation with ET tube   . Coronary artery disease   . Depression   . Diabetes mellitus    diet controlled/on meds  . Fibromyalgia   . GERD (gastroesophageal reflux disease)   . H/O hiatal hernia   . Headache(784.0)    "recurring"  . High cholesterol   . Hyperlipidemia   . Hypertension   . 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 Migraines    "til ~ 1980"  . PONV (postoperative nausea and vomiting)   . Restless leg syndrome   . Sciatic nerve pain    "from pinched nerve"  . Sleep apnea    uses CPAP  . Weakness of right side of body    "I've had PT for it; they don't know what it's from".  CORTISONE INJECTION INTO BACK 08/30/12    Past Surgical History:  Procedure Laterality Date  . [place] STUDY N/A 12/29/2015   Procedure: [place] STUDY;  Surgeon: Manus Gunning, MD;  [facility];  Service: Gastroenterology;  Laterality: N/A;  . CARPAL TUNNEL RELEASE Left 07/02/2015   Procedure: CARPAL TUNNEL RELEASE;  Surgeon: Frederik Pear, MD;  Location: [place] [facility];  Service: Orthopedics;  Laterality:  Left;  . CATARACT EXTRACTION, BILATERAL Bilateral    Oct and Nov 2017  . CORONARY ANGIOPLASTY WITH STENT PLACEMENT  06/2011   "1"  . CORONARY ARTERY BYPASS GRAFT  2005   CABG X 2  . DILATION AND CURETTAGE OF UTERUS     "more than once"  . ELBOW ARTHROSCOPY Left 07/02/2015   Procedure: ARTHROSCOPY LEFT ELBOW WITH DEBRIDEMENT AND REMOVAL LOOSE BODY;  Surgeon: Frederik Pear, MD;  Location: [facility];  Service: Orthopedics;  Laterality: Left;  . ESOPHAGEAL MANOMETRY N/A 12/29/2015   Procedure: ESOPHAGEAL MANOMETRY (EM);  Surgeon: Manus Gunning, MD;  [facility];  Service: Gastroenterology;  Laterality: N/A;  . FRACTURE SURGERY  ~ 2005   nose  . KNEE ARTHROSCOPY  09/04/2012   Procedure: ARTHROSCOPY KNEE;  Surgeon: Gearlean Alf, MD;  [facility];  Service: Orthopedics;  Laterality: Right;  right knee arthroscopy with medial and lateral meniscus debridement  . 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 MOUTH SURGERY  2004   "bone replacement; had cadavear bones put in; face was collapsing"  . NASAL SEPTUM SURGERY  ~ 1986  . TOTAL KNEE ARTHROPLASTY Right 07/07/2013   Procedure: RIGHT TOTAL KNEE ARTHROPLASTY;  Surgeon:  Gearlean Alf, MD;  [facility];  Service: Orthopedics;  Laterality: Right;    Current Medications: Current Meds  Medication Sig  . atorvastatin (LIPITOR) 80 MG tablet TAKE 1 TABLET BY MOUTH EVERY DAY  . CARAFATE 1 GM/10ML suspension TAKE 10 MLS (1 G TOTAL) BY MOUTH EVERY 6 (SIX) HOURS AS NEEDED.  [name] cetirizine (ZYRTEC) 10 MG tablet Take 10 mg by mouth daily. Reported on 12/23/2015  . chlorthalidone (HYGROTON) 25 MG tablet Take 1 tablet (25 mg total) by mouth daily.  . clonazePAM (KLONOPIN) 1 MG tablet TAKE 2 TABLETS (2 MG TOTAL) BY MOUTH AT BEDTIME.  [name] DEXILANT 60 MG capsule TAKE 1 CAPSULE BY MOUTH EVERY DAY  . gabapentin (NEURONTIN) 300 MG capsule TAKE 1 TO 2 CAPSULES BY MOUTH AT BEDTIME  . Insulin Pen Needle  (NOVOFINE) 32G X 6 MM MISC Use to inject Victoza daily.  Dx: E11.9  . liraglutide (VICTOZA) 18 MG/3ML SOPN Inject 0.2 mLs (1.2 mg total) into the skin daily.  . meloxicam (MOBIC) 15 MG tablet Take 15 mg by mouth daily. Reported on 12/23/2015  . metFORMIN (GLUCOPHAGE-XR) 500 MG 24 hr tablet Take 2 tablets (1,000 mg total) by mouth 2 (two) times daily.  . metoprolol succinate (TOPROL-XL) 100 MG 24 hr tablet TAKE 1 TABLET (100 MG TOTAL) BY MOUTH DAILY. TAKE WITH OR IMMEDIATELY FOLLOWING A MEAL.  . Multiple Vitamins-Minerals (PRESERVISION AREDS PO) Take 1 tablet by mouth 2 (two) times daily. Reported on 12/23/2015  . MYRBETRIQ 25 MG TB24 tablet TAKE 1 TABLET BY MOUTH EVERY DAY  . nitroGLYCERIN (NITROSTAT) 0.4 MG SL tablet PLACE 1 TABLET (0.4 MG TOTAL) UNDER THE TONGUE EVERY 5 (FIVE) MINUTES AS NEEDED FOR CHEST PAIN  . ONE TOUCH ULTRA TEST test strip CHECK BLOOD SUGAR TWICE A DAY  . ONETOUCH DELICA LANCETS 10X MISC Use to check blood sugar once daily.  Dx: E11.9  . Probiotic Product (PROBIOTIC DAILY PO) Take 1 tablet by mouth daily.   [name] tiZANidine (ZANAFLEX) 4 MG tablet TAKE 1 TABLET BY MOUTH EVERY DAY IN THE EVENING  . traMADol (ULTRAM) 50 MG tablet Take 50 mg by mouth every 6 (six) hours as needed. for pain  . venlafaxine XR (EFFEXOR-XR) 37.5 MG 24 hr capsule TAKE 1 CAPSULE (37.5 MG TOTAL) BY MOUTH 2 (TWO) TIMES DAILY.  . [DISCONTINUED] aspirin 325 MG tablet Take 325 mg by mouth daily.     Allergies:   Hydrocodone-acetaminophen; Percocet [oxycodone-acetaminophen]; and Sulfa antibiotics   Social History   Socioeconomic History  . Marital status: Married    Spouse name: Not on file  . Number of children: 2  . Years of education: college  . Highest education level: Not on file  Occupational History  . Occupation: Retired   Scientific laboratory technician  . Financial resource strain: Not on file  . Food insecurity:    Worry: Not on file    Inability: Not on file  . Transportation needs:    Medical: Not on file     Non-medical: Not on file  Tobacco Use  . Smoking status: Never Smoker  . Smokeless tobacco: Never Used  Substance and Sexual Activity  . Alcohol use: Yes    Alcohol/week: 1.0 standard drinks    Types: 1 Glasses of wine per week    Comment: "occasionally drink wine"  . Drug use: No  . Sexual activity: Yes  Lifestyle  . Physical activity:    Days per week: Not on file    Minutes  per session: Not on file  . Stress: Not on file  Relationships  . Social connections:    Talks on phone: Not on file    Gets together: Not on file    Attends religious service: Not on file    Active member of club or organization: Not on file    Attends meetings of clubs or organizations: Not on file    Relationship status: Not on file  Other Topics Concern  . Not on file  Social History Narrative   Drinks 1 cup of coffee a day      Family History: The patient's family history includes Breast cancer (age of onset: 89) in her mother; Cancer (age of onset: 10) in her mother and sister; Colon cancer in her sister; Dementia in her sister; Diabetes in her sister; GER disease in her daughter; Heart disease in her brother; Heart disease (age of onset: 38) in her father; Hypertension in her brother, daughter, and sister. There is no history of Heart attack or Stroke.  ROS:   Please see the history of present illness.    No fevers chills bleeding syncope all other systems reviewed and are negative.  EKGs/Labs/Other Studies Reviewed:    The following studies were reviewed today:  ECHO 10/03/17: - Left ventricle: The cavity size was normal. Wall thickness was   increased in a pattern of mild LVH. Systolic function was normal.   The estimated ejection fraction was in the range of 55% to 60%.   Wall motion was normal; there were no regional wall motion   abnormalities. Doppler parameters are consistent with abnormal   left ventricular relaxation (grade 1 diastolic dysfunction). The   E/e&' ratio is >15,  suggesting elevated LV filling pressure. - Left atrium: The atrium was normal in size. - Tricuspid valve: There was trivial regurgitation. - Pulmonary arteries: PA peak pressure: 31 mm Hg (S). - Inferior vena cava: The vessel was normal in size. The   respirophasic diameter changes were in the normal range (= 50%),   consistent with normal central venous pressure.  Impressions:  - Compared to a prior study in 2016, the LVEF is unchanged. There   is now grade 1 DD with elevated LV filling pressure.  Nuclear stress test 04/07/2016 was also low risk.  EKG:  EKG is  ordered today.  The ekg ordered today demonstrates sinus rhythm 71 nonspecific T wave changes personally reviewed and interpreted.  No change from prior EKG 06/04/2017.  Recent Labs: 06/17/2018: ALT 19; BUN 20; Creatinine, Ser 0.85; Potassium 4.0; Sodium 143  Recent Lipid Panel    Component Value Date/Time   CHOL 117 06/17/2018 0953   TRIG 73.0 06/17/2018 0953   HDL 49.50 06/17/2018 0953   CHOLHDL 2 06/17/2018 0953   VLDL 14.6 06/17/2018 0953   LDLCALC 53 06/17/2018 0953    Physical Exam:    VS:  BP 130/70   Pulse 71   Ht 5' 2.25" (1.581 m)   Wt 154 lb 6.4 oz (70 kg)   BMI 28.01 kg/m     Wt Readings from Last 3 Encounters:  10/01/18 154 lb 6.4 oz (70 kg)  08/05/18 151 lb (68.5 kg)  06/20/18 150 lb (68 kg)     GEN:  Well nourished, well developed in no acute distress HEENT: Normal NECK: No JVD; No carotid bruits LYMPHATICS: No lymphadenopathy CARDIAC: RRR, no murmurs, rubs, gallops RESPIRATORY:  Clear to auscultation without rales, wheezing or rhonchi  ABDOMEN: Soft, non-tender, non-distended MUSCULOSKELETAL:  No edema; No deformity wearing arm sling SKIN: Warm and dry NEUROLOGIC:  Alert and oriented x 3 PSYCHIATRIC:  Normal affect   ASSESSMENT:    1. Coronary artery disease due to lipid rich plaque   2. Essential hypertension   3. Shortness of breath   4. Pure hypercholesterolemia    PLAN:    In  order of problems listed above:  Coronary artery disease status post bypass - Had drug-eluting stent placement to diagonal post bypass.  Doing quite well.  No significant anginal symptoms.  She is on high-dose aspirin because of prior stroke.  At last visit I checked an echocardiogram because of shortness of breath.  Disequilibrium -Could this possibly be from prior cerebellar stroke/TIA.  She is undergoing physical therapy.  Be careful with falls.  Hyperlipidemia - Continue with high intensity atorvastatin.  LDL in the 50s.  Excellent.  Diastolic dysfunction - Seen on echocardiogram, mild.  Hydrochlorothiazide was added at that time.  Overall feels better.  Less short of breath.  Continue with current plan.  Blood pressure under excellent control.  Diabetes with hypertension -Well-controlled.  Hemoglobin A1c 6.3.  Medication Adjustments/Labs and Tests Ordered: Current medicines are reviewed at length with the patient today.  Concerns regarding medicines are outlined above.  Orders Placed This Encounter  Procedures  . EKG 12-Lead   Meds ordered this encounter  Medications  . aspirin EC 81 MG tablet    Sig: Take 1 tablet (81 mg total) by mouth daily.    Patient Instructions  Medication Instructions:  Please decrease your Aspirin to 81 mg a day. Continue all other medications as listed.  If you need a refill on your cardiac medications before your next appointment, please call your pharmacy.   Follow-Up: At [facility], you and your health needs are our priority.  As part of our continuing mission to provide you with exceptional heart care, we have created designated Provider Care Teams.  These Care Teams include your primary Cardiologist (physician) and Advanced Practice Providers (APPs -  Physician Assistants and Nurse Practitioners) who all work together to provide you with the care you need, when you need it. You will need a follow up appointment in 12 months.  Please call  our office 2 months in advance to schedule this appointment.  You may see Dr Candee Furbish or one of the following Advanced Practice Providers on your designated Care Team:   Truitt Merle, NP Cecilie Kicks, NP . Kathyrn Drown, NP  Thank you for choosing [facility]!!        Signed, Candee Furbish, MD  10/01/2018 10:08 AM    [place]

## 2018-10-02 ENCOUNTER — Other Ambulatory Visit: Payer: Self-pay | Admitting: Family Medicine

## 2018-10-02 ENCOUNTER — Other Ambulatory Visit: Payer: Self-pay | Admitting: Cardiology

## 2018-10-02 ENCOUNTER — Ambulatory Visit: Payer: Medicare Other

## 2018-10-02 DIAGNOSIS — M6281 Muscle weakness (generalized): Secondary | ICD-10-CM

## 2018-10-02 DIAGNOSIS — M25611 Stiffness of right shoulder, not elsewhere classified: Secondary | ICD-10-CM

## 2018-10-02 DIAGNOSIS — M25511 Pain in right shoulder: Secondary | ICD-10-CM

## 2018-10-02 NOTE — Patient Instructions (Signed)
Access Code: [email or web address]  URL: [email or web address]  Date: 10/02/2018  Prepared by: Roxana Hires   Exercises  Flexion-Extension Shoulder Pendulum with Table Support - 10 reps - 2 sets - 2x daily - 7x weekly  Horizontal Shoulder Pendulum with Table Support - 10 reps - 2 sets - 2x daily - 7x weekly

## 2018-10-02 NOTE — Telephone Encounter (Signed)
Last office visit 08/05/2018 with Dr. Lorelei Pont for pain in right humerus.  Last refilled 04/02/2018 for #180 with 1 refill.  Next appt: 12/24/2018 for CPE.

## 2018-10-02 NOTE — Therapy (Signed)
[place] MAIN [facility] SERVICES [address] [place], [place], [address] Phone: [phone]   Fax:  [phone]  Physical Therapy Treatment  Patient Details  Name: Morgan [name] MRN: [id number] Date of Birth: [birth year] Referring Provider (PT): Dr. Mayer Camel   Encounter Date: 10/02/2018  PT End of Session - 10/02/18 1413    Visit Number  2    Number of Visits  17    Date for PT Re-Evaluation  11/25/18    Authorization Type  Last goals: 09/30/18    PT Start Time  1355    PT Stop Time  1430    PT Time Calculation (min)  35 min    Activity Tolerance  Patient limited by pain    Behavior During Therapy  [facility] for tasks assessed/performed       Past Medical History:  Diagnosis Date  . Angina   . Arthritis   . Atrial fibrillation (HCC)    ASPIRIN FOR BLOOD THINNER  . Atypical mole 09/22/2016  . Bulging discs    lumbar   . Cancer ([place])    melanoma  . Complication of anesthesia    pt states has choking sensation with ET tube   . Coronary artery disease   . Depression   . Diabetes mellitus    diet controlled/on meds  . Fibromyalgia   . GERD (gastroesophageal reflux disease)   . H/O hiatal hernia   . Headache(784.0)    "recurring"  . High cholesterol   . Hyperlipidemia   . Hypertension   . 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 Migraines    "til ~ 1980"  . PONV (postoperative nausea and vomiting)   . Restless leg syndrome   . Sciatic nerve pain    "from pinched nerve"  . Sleep apnea    uses CPAP  . Weakness of right side of body    "I've had PT for it; they don't know what it's from".  CORTISONE INJECTION INTO BACK 08/30/12    Past Surgical History:  Procedure Laterality Date  . [place] STUDY N/A 12/29/2015   Procedure: [place] STUDY;  Surgeon: Manus Gunning, MD;  [facility];  Service: Gastroenterology;  Laterality: N/A;  . CARPAL TUNNEL RELEASE Left 07/02/2015   Procedure: CARPAL TUNNEL RELEASE;  Surgeon: Frederik Pear, MD;  Location: [place];  Service: Orthopedics;  Laterality: Left;  . CATARACT EXTRACTION, BILATERAL Bilateral    Oct and Nov 2017  . CORONARY ANGIOPLASTY WITH STENT PLACEMENT  06/2011   "1"  . CORONARY ARTERY BYPASS GRAFT  2005   CABG X 2  . DILATION AND CURETTAGE OF UTERUS     "more than once"  . ELBOW ARTHROSCOPY Left 07/02/2015   Procedure: ARTHROSCOPY LEFT ELBOW WITH DEBRIDEMENT AND REMOVAL LOOSE BODY;  Surgeon: Frederik Pear, MD;  Location: [place];  Service: Orthopedics;  Laterality: Left;  . ESOPHAGEAL MANOMETRY N/A 12/29/2015   Procedure: ESOPHAGEAL MANOMETRY (EM);  Surgeon: Manus Gunning, MD;  [facility];  Service: Gastroenterology;  Laterality: N/A;  . FRACTURE SURGERY  ~ 2005   nose  . KNEE ARTHROSCOPY  09/04/2012   Procedure: ARTHROSCOPY KNEE;  Surgeon: Gearlean Alf, MD;  [facility];  Service: Orthopedics;  Laterality: Right;  right knee arthroscopy with medial and lateral meniscus debridement  . 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 MOUTH SURGERY  2004   "bone replacement; had cadavear  bones put in; face was collapsing"  . NASAL SEPTUM SURGERY  ~ 1986  . TOTAL KNEE ARTHROPLASTY Right 07/07/2013   Procedure: RIGHT TOTAL KNEE ARTHROPLASTY;  Surgeon: Gearlean Alf, MD;  [facility];  Service: Orthopedics;  Laterality: Right;    There were no vitals filed for this visit.  Subjective Assessment - 10/02/18 1358    Subjective  Pt reports that she is doing well today. She saw orthopedist this morning who reported that she can discontinue use of her sling. She denies resting pain but does have pain with some AROM of her R shoulder. When asked pt states that orthopedist told her she can move her shoulder as long as it doesn't hurt    Pertinent History  On 08/03/18 pt was carrying a folding table and fell and right arm was under the handle got stuck between the door and the table. She landed  on her R side. Tried to get up and had to pull at her arm. Pt had immediate pain in her right shoulder. She waiteduntil Monday and went to see Dr. Edilia Bo her PCP. He took radiographs but didn't find any fractures. She continued to have pain so she called Dr. Damita Dunnings office, her orthopedist, and saw him that same week. He took additional radiographs and found a hairline fracture in her humeral shaft and a greater tuberosity fracture per patient report. She states that she is not healing well and has recurrent plain film radiographs. She is still wearing a sling and was encouraged not to perform any AROM of her right shoulder. Since her last visit with Dr. Mayer Camel on 09/20/18 she has had an additional fall and landed on her R shoulder. She reports additional bruising and pain in her R shoulder. She has a follow-up appt scheduled with her orthopedist for 10/15/18. 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 ROS negative for red flags.    Limitations  Lifting    Diagnostic tests  Plain film radiographs with decreased healing per pt report    Patient Stated Goals  Improve ability to use RUE and decrease pain    Currently in Pain?  No/denies           TREATMENT  Ther-ex  Single point cane practice in LUE x 120', cues for proper sequencing with cane, performance improves with repeated practice; Pendulum hangs off side of table x 60s (added to HEP) Pendulum flex/ext and horizontal abd/add standing and resting at edge of table x 15 each direction (added to HEP):   Manual Therapy  R shoulder PROM flexion, scaption, abduction, IR, and ER with end range holds. Pt able to gradually allow for more PROM with cues to relax and dead fish mobs of R shoulder; STM to R upper trap and ant/mid/post deltoid;   Pt educated throughout session about proper posture and technique with pendulum hangs. Improved technique, movement at target joints, after min to mod verbal, visual, tactile cues.     Pt struggles to relax R shoulder during PROM but this improves as session progresses. She is able to get close to end range PROM in all directions by the end of the session. She reports tension/pain in R upper trap so STM performed which improves. Notable trigger points. Pt issued HEP for PROM/relaxation. Encouraged pt to follow-up as scheduled. Pt will benefit from PT services to address deficits in strength, mobility, and pain in order to return to full function at home.  PT Short Term Goals - 09/30/18 1021      PT SHORT TERM GOAL #1   Title  Pt will be independent with HEP in order to improve strength and decrease pain in order to improve pain-free function at home.    Time  4    Period  Weeks    Status  New    Target Date  10/28/18        PT Long Term Goals - 09/30/18 1027      PT LONG TERM GOAL #1   Title  Pt will decrease quick DASH score by at least 8% in order to demonstrate clinically significant reduction in disability.    Baseline  09/30/18: 84.1%    Time  8    Period  Weeks    Status  New    Target Date  11/25/18      PT LONG TERM GOAL #2   Title  Pt will decrease worst pain as reported on NPRS by at least 3 points in order to demonstrate clinically significant reduction in pain.    Baseline  09/30/18: worst: 9/10    Time  8    Period  Weeks    Status  New    Target Date  11/25/18      PT LONG TERM GOAL #3   Title  Pt will improve PROM of R shoulder to within 10 degrees of R shoulder in order to improve functional use of R shoulder for ADLs/IADLs.    Baseline  09/30/18: R/L 129/150 Shoulder flexion, 100/165 Shoulder abduction, 50/93 Shoulder external rotation, 50/70 Shoulder internal rotation    Time  8    Period  Weeks    Status  New    Target Date  11/25/18            Plan - 10/02/18 1414    Clinical Impression Statement  Pt struggles to relax R shoulder during PROM but this improves as session progresses. She  is able to get close to end range PROM in all directions by the end of the session. She reports tension/pain in R upper trap so STM performed which improves. Notable trigger points. Pt issued HEP for PROM/relaxation. Encouraged pt to follow-up as scheduled. Pt will benefit from PT services to address deficits in strength, mobility, and pain in order to return to full function at home.     Rehab Potential  Good    PT Frequency  2x / week    PT Duration  8 weeks    PT Treatment/Interventions  ADLs/Self Care Home Management;Aquatic Therapy;Canalith Repostioning;Cryotherapy;Electrical Stimulation;Iontophoresis 4mg /ml Dexamethasone;Moist Heat;Traction;Ultrasound;Contrast Bath;DME Instruction;Gait training;Stair training;Functional mobility training;Therapeutic activities;Therapeutic exercise;Balance training;Neuromuscular re-education;Patient/family education;Manual techniques;Passive range of motion;Dry needling;Splinting;Taping;Vestibular    PT Next Visit Plan  Review HEP and progress, Progress PROM of R shoulder    PT Home Exercise Plan  Cervical retractions, Scapular retractions, R shoulder hangs of side of table, R shoulder flex/ext and horizontal abd/add pendulums;    Consulted and Agree with Plan of Care  Patient       Patient will benefit from skilled therapeutic intervention in order to improve the following deficits and impairments:  Decreased strength, Decreased range of motion, Impaired UE functional use, Pain  Visit Diagnosis: Acute pain of right shoulder  Stiffness of right shoulder, not elsewhere classified  Muscle weakness (generalized)     Problem List Patient Active Problem List   Diagnosis Date Noted  . Malignant melanoma ([place]) 11/23/2016  . Urge incontinence 11/23/2016  .  Atypical mole 09/22/2016  . Slurring of speech 05/12/2016  . Traumatic arthritis elbow 05/04/2015  . Left foot pain 12/31/2014  . Severe sprain of left ankle 12/31/2014  . Osteopenia 09/15/2014  .  History of TIA (transient ischemic attack) 05/19/2014  . Fracture of coronoid process of ulna, left, closed 04/25/2014  . Ingrown toenail 01/15/2014  . Obesity (BMI 30-39.9) 10/08/2013  . OA (osteoarthritis) of knee 07/07/2013  . Chronic low back pain 05/09/2013  . Obstructive sleep apnea 05/09/2013  . GERD (gastroesophageal reflux disease) 05/09/2013  . Hiatal hernia 05/09/2013  . Fibromyalgia syndrome 05/09/2013  . Depression, major, in partial remission ([place]) 05/09/2013  . RLS (restless legs syndrome) 05/09/2013  . Diabetes mellitus with no complication ([place]) 53/61/4431  . High cholesterol 05/09/2013  . 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 Family history of colon cancer 05/09/2013  . Allergic rhinitis 05/09/2013  . Lateral meniscal tear 09/04/2012  . CAD (coronary artery disease) 09/27/2011  . HTN (hypertension) 09/27/2011  . Paroxysmal A-fib ([place]) 09/27/2011    Huprich,Jason 10/02/2018, 2:52 PM  [place] MAIN [facility] SERVICES [address] [place], [place], [address] Phone: [phone]   Fax:  [phone]  Name: Tiera Mensinger MRN: [id number] Date of Birth: 06-30-1941

## 2018-10-07 ENCOUNTER — Ambulatory Visit: Payer: Medicare Other

## 2018-10-07 DIAGNOSIS — M25611 Stiffness of right shoulder, not elsewhere classified: Secondary | ICD-10-CM

## 2018-10-07 DIAGNOSIS — M25511 Pain in right shoulder: Secondary | ICD-10-CM

## 2018-10-07 DIAGNOSIS — M6281 Muscle weakness (generalized): Secondary | ICD-10-CM

## 2018-10-07 NOTE — Therapy (Signed)
[place] MAIN [facility] SERVICES [address] [place], [place], [address] Phone: [phone]   Fax:  [phone]  Physical Therapy Treatment  Patient Details  Name: Morgan [name] MRN: [id number] Date of Birth: Dec 05, 1940 Referring Provider (PT): Dr. Mayer Camel   Encounter Date: 10/07/2018  PT End of Session - 10/07/18 0816    Visit Number  3    Number of Visits  17    Date for PT Re-Evaluation  11/25/18    Authorization Type  Progress note: 3/10, Last goals: 09/30/18 (eval)    PT Start Time  0812    PT Stop Time  0845    PT Time Calculation (min)  33 min    Activity Tolerance  Patient limited by pain    Behavior During Therapy  [facility] for tasks assessed/performed       Past Medical History:  Diagnosis Date  . Angina   . Arthritis   . Atrial fibrillation (HCC)    ASPIRIN FOR BLOOD THINNER  . Atypical mole 09/22/2016  . Bulging discs    lumbar   . Cancer ([place])    melanoma  . Complication of anesthesia    pt states has choking sensation with ET tube   . Coronary artery disease   . Depression   . Diabetes mellitus    diet controlled/on meds  . Fibromyalgia   . GERD (gastroesophageal reflux disease)   . H/O hiatal hernia   . Headache(784.0)    "recurring"  . High cholesterol   . Hyperlipidemia   . Hypertension   . 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 Migraines    "til ~ 1980"  . PONV (postoperative nausea and vomiting)   . Restless leg syndrome   . Sciatic nerve pain    "from pinched nerve"  . Sleep apnea    uses CPAP  . Weakness of right side of body    "I've had PT for it; they don't know what it's from".  CORTISONE INJECTION INTO BACK 08/30/12    Past Surgical History:  Procedure Laterality Date  . [place] STUDY N/A 12/29/2015   Procedure: [place] STUDY;  Surgeon: Manus Gunning, MD;  [facility]: [facility];  Service: Gastroenterology;  Laterality: N/A;  . CARPAL TUNNEL RELEASE Left 07/02/2015   Procedure: CARPAL  TUNNEL RELEASE;  Surgeon: Frederik Pear, MD;  Location: [place];  Service: Orthopedics;  Laterality: Left;  . CATARACT EXTRACTION, BILATERAL Bilateral    Oct and Nov 2017  . CORONARY ANGIOPLASTY WITH STENT PLACEMENT  06/2011   "1"  . CORONARY ARTERY BYPASS GRAFT  2005   CABG X 2  . DILATION AND CURETTAGE OF UTERUS     "more than once"  . ELBOW ARTHROSCOPY Left 07/02/2015   Procedure: ARTHROSCOPY LEFT ELBOW WITH DEBRIDEMENT AND REMOVAL LOOSE BODY;  Surgeon: Frederik Pear, MD;  Location: [place];  Service: Orthopedics;  Laterality: Left;  . ESOPHAGEAL MANOMETRY N/A 12/29/2015   Procedure: ESOPHAGEAL MANOMETRY (EM);  Surgeon: Manus Gunning, MD;  [facility];  Service: Gastroenterology;  Laterality: N/A;  . FRACTURE SURGERY  ~ 2005   nose  . KNEE ARTHROSCOPY  09/04/2012   Procedure: ARTHROSCOPY KNEE;  Surgeon: Gearlean Alf, MD;  [facility];  Service: Orthopedics;  Laterality: Right;  right knee arthroscopy with medial and lateral meniscus debridement  . 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 MOUTH SURGERY  2004   "  bone replacement; had cadavear bones put in; face was collapsing"  . NASAL SEPTUM SURGERY  ~ 1986  . TOTAL KNEE ARTHROPLASTY Right 07/07/2013   Procedure: RIGHT TOTAL KNEE ARTHROPLASTY;  Surgeon: Gearlean Alf, MD;  [facility];  Service: Orthopedics;  Laterality: Right;    There were no vitals filed for this visit.  Subjective Assessment - 10/07/18 0814    Subjective  Pt reports that her shoulder is stiff this morning due to the cold. She tried the pendulum exercises and reports thta they went well but some mild pain/soreness with this exercise. No specific questions or concerns at this time.     Pertinent History  On 08/03/18 pt was carrying a folding table and fell and right arm was under the handle got stuck between the door and the table. She landed on her R side. Tried to get up and had to  pull at her arm. Pt had immediate pain in her right shoulder. She waiteduntil Monday and went to see Dr. Edilia Bo her PCP. He took radiographs but didn't find any fractures. She continued to have pain so she called Dr. Damita Dunnings office, her orthopedist, and saw him that same week. He took additional radiographs and found a hairline fracture in her humeral shaft and a greater tuberosity fracture per patient report. She states that she is not healing well and has recurrent plain film radiographs. She is still wearing a sling and was encouraged not to perform any AROM of her right shoulder. Since her last visit with Dr. Mayer Camel on 09/20/18 she has had an additional fall and landed on her R shoulder. She reports additional bruising and pain in her R shoulder. She has a follow-up appt scheduled with her orthopedist for 10/15/18. 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 ROS negative for red flags.    Limitations  Lifting    Diagnostic tests  Plain film radiographs with decreased healing per pt report    Patient Stated Goals  Improve ability to use RUE and decrease pain    Currently in Pain?  Yes    Pain Score  3     Pain Location  Shoulder    Pain Orientation  Right    Pain Descriptors / Indicators  Tightness    Pain Type  Acute pain    Pain Onset  1 to 4 weeks ago         TREATMENT  Ther-ex  Moist heat pack applied to R shoulder at start of session during history and cervical retractions x 5 minutes (2 minutes unbilled); Supine cervical retractions 5s hold x 10, verbal as well as tactile cues required; Seated scapular retractions 5s hold x 10, verbal as well as tactile cues required; Attempted pendulum flex/ext and horizontal abd/add HEP in standing but pt is unable to perform correctly or relax so discontinued;   Manual Therapy  R shoulder PROM flexion, scaption, abduction, IR, and ER with end range holds. Pt able to gradually allow for more PROM with cues to relax and  dead fish mobs of R shoulder; IFC applied to R shoulder during PROM at pt tolerated intensity of 6.8 and then increased to 7.8;  R self upper trap stretch 30s hold x 2 (added to HEP);   Pt educated throughout session about proper posture and technique with pendulum hangs. Improved technique, movement at target joints, after min to mod verbal, visual, tactile cues.    Pt arrived late again for  her appointment so session was abbreviated accordingly. She continues to struggle to relax R shoulder during PROM but improves with IFC and repetition. She is able to get close to end range PROM in all directions by the end of the session. She reports tension/pain in R upper trap so R upper trap stretch issued to add to HEP. She is unable to perform pendulums passively so discontinued. Reviewed additional HEP with patient. Pt can begin AAROM next week which will be the 2 week point in her therapy. Encouraged pt to follow-up as scheduled. Pt will benefit from PT services to address deficits in strength, mobility, and pain in order to return to full function at home.                         PT Short Term Goals - 09/30/18 1021      PT SHORT TERM GOAL #1   Title  Pt will be independent with HEP in order to improve strength and decrease pain in order to improve pain-free function at home.    Time  4    Period  Weeks    Status  New    Target Date  10/28/18        PT Long Term Goals - 09/30/18 1027      PT LONG TERM GOAL #1   Title  Pt will decrease quick DASH score by at least 8% in order to demonstrate clinically significant reduction in disability.    Baseline  09/30/18: 84.1%    Time  8    Period  Weeks    Status  New    Target Date  11/25/18      PT LONG TERM GOAL #2   Title  Pt will decrease worst pain as reported on NPRS by at least 3 points in order to demonstrate clinically significant reduction in pain.    Baseline  09/30/18: worst: 9/10    Time  8    Period   Weeks    Status  New    Target Date  11/25/18      PT LONG TERM GOAL #3   Title  Pt will improve PROM of R shoulder to within 10 degrees of R shoulder in order to improve functional use of R shoulder for ADLs/IADLs.    Baseline  09/30/18: R/L 129/150 Shoulder flexion, 100/165 Shoulder abduction, 50/93 Shoulder external rotation, 50/70 Shoulder internal rotation    Time  8    Period  Weeks    Status  New    Target Date  11/25/18            Plan - 10/07/18 0817    Clinical Impression Statement  Pt arrived late again for her appointment so session was abbreviated accordingly. She continues to struggle to relax R shoulder during PROM but improves with IFC and repetition. She is able to get close to end range PROM in all directions by the end of the session. She reports tension/pain in R upper trap so R upper trap stretch issued to add to HEP. She is unable to perform pendulums passively so discontinued. Reviewed additional HEP with patient. Pt can begin AAROM next week which will be the 2 week point in her therapy. Encouraged pt to follow-up as scheduled. Pt will benefit from PT services to address deficits in strength, mobility, and pain in order to return to full function at home.     Rehab Potential  Good  PT Frequency  2x / week    PT Duration  8 weeks    PT Treatment/Interventions  ADLs/Self Care Home Management;Aquatic Therapy;Canalith Repostioning;Cryotherapy;Electrical Stimulation;Iontophoresis 4mg /ml Dexamethasone;Moist Heat;Traction;Ultrasound;Contrast Bath;DME Instruction;Gait training;Stair training;Functional mobility training;Therapeutic activities;Therapeutic exercise;Balance training;Neuromuscular re-education;Patient/family education;Manual techniques;Passive range of motion;Dry needling;Splinting;Taping;Vestibular    PT Next Visit Plan  Review HEP and progress, Progress PROM of R shoulder    PT Home Exercise Plan  Cervical retractions, Scapular retractions, R shoulder hangs  of side of table, R upper trap stretch    Consulted and Agree with Plan of Care  Patient       Patient will benefit from skilled therapeutic intervention in order to improve the following deficits and impairments:  Decreased strength, Decreased range of motion, Impaired UE functional use, Pain  Visit Diagnosis: Acute pain of right shoulder  Stiffness of right shoulder, not elsewhere classified  Muscle weakness (generalized)     Problem List Patient Active Problem List   Diagnosis Date Noted  . Malignant melanoma ([place]) 11/23/2016  . Urge incontinence 11/23/2016  . Atypical mole 09/22/2016  . Slurring of speech 05/12/2016  . Traumatic arthritis elbow 05/04/2015  . Left foot pain 12/31/2014  . Severe sprain of left ankle 12/31/2014  . Osteopenia 09/15/2014  . History of TIA (transient ischemic attack) 05/19/2014  . Fracture of coronoid process of ulna, left, closed 04/25/2014  . Ingrown toenail 01/15/2014  . Obesity (BMI 30-39.9) 10/08/2013  . OA (osteoarthritis) of knee 07/07/2013  . Chronic low back pain 05/09/2013  . Obstructive sleep apnea 05/09/2013  . GERD (gastroesophageal reflux disease) 05/09/2013  . Hiatal hernia 05/09/2013  . Fibromyalgia syndrome 05/09/2013  . Depression, major, in partial remission ([place]) 05/09/2013  . RLS (restless legs syndrome) 05/09/2013  . Diabetes mellitus with no complication ([place]) 96/22/2979  . High cholesterol 05/09/2013  . 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 Family history of colon cancer 05/09/2013  . Allergic rhinitis 05/09/2013  . Lateral meniscal tear 09/04/2012  . CAD (coronary artery disease) 09/27/2011  . HTN (hypertension) 09/27/2011  . Paroxysmal A-fib ([place]) 09/27/2011    Jovon Streetman 10/07/2018, 8:49 AM  [place] MAIN [facility] SERVICES [address] [place], [place], [address] Phone: [phone]   Fax:  [phone]  Name: Morgan [name] MRN: [id number] Date of Birth:  08-05-1941

## 2018-10-07 NOTE — Patient Instructions (Signed)
Access Code: [facility]  URL: [email or web address]  Date: 10/07/2018  Prepared by: Roxana Hires   Exercises  Seated Upper Trapezius Stretch - 3 reps - 30 seconds hold - 2x daily - 7x weekly

## 2018-10-12 ENCOUNTER — Other Ambulatory Visit: Payer: Self-pay | Admitting: Family Medicine

## 2018-10-14 ENCOUNTER — Encounter: Payer: Self-pay | Admitting: Cardiology

## 2018-10-14 ENCOUNTER — Ambulatory Visit: Payer: Medicare Other | Admitting: Cardiology

## 2018-10-14 VITALS — BP 92/48 | HR 69 | Ht 62.25 in | Wt 154.8 lb

## 2018-10-14 DIAGNOSIS — I10 Essential (primary) hypertension: Secondary | ICD-10-CM | POA: Diagnosis not present

## 2018-10-14 DIAGNOSIS — E669 Obesity, unspecified: Secondary | ICD-10-CM

## 2018-10-14 DIAGNOSIS — G4733 Obstructive sleep apnea (adult) (pediatric): Secondary | ICD-10-CM | POA: Diagnosis not present

## 2018-10-14 NOTE — Patient Instructions (Addendum)
Medication Instructions:  Stop: Chlorthalidone 25 mg  If you need a refill on your cardiac medications before your next appointment, please call your pharmacy.   Lab work:  If you have labs (blood work) drawn today and your tests are completely normal, you will receive your results only by: [name] MyChart Message (if you have MyChart) OR . A paper copy in the mail If you have any lab test that is abnormal or we need to change your treatment, we will call you to review the results.  Follow-Up: At [facility], you and your health needs are our priority.  As part of our continuing mission to provide you with exceptional heart care, we have created designated Provider Care Teams.  These Care Teams include your primary Cardiologist (physician) and Advanced Practice Providers (APPs -  Physician Assistants and Nurse Practitioners) who all work together to provide you with the care you need, when you need it. You will need a follow up appointment in 1 years.  Please call our office 2 months in advance to schedule this appointment.  You may see Dr. Radford Pax   Check Blood pressure for 1 week, 1 hour after taking blood pressure medications and call the office with the results.

## 2018-10-14 NOTE — Progress Notes (Signed)
Cardiology Office Note:    Date:  10/14/2018   ID:  Morgan [name], DOB 09-Jan-1941, MRN [id number]  PCP:  Jinny Sanders, MD  Cardiologist:  Candee Furbish, MD  She  Referring MD: Jinny Sanders, MD   Chief Complaint  Patient presents with  . Sleep Apnea  . Hypertension    History of Present Illness:    Morgan [name] is a 77 y.o. female with a hx of OSA, HTN and obesity.  She is doing well with her CPAP device and thinks that she has gotten used to it.  She tolerates the mask and feels the pressure is adequate.  Since going on CPAP She feels rested in the am and has no significant daytime sleepiness.  She denies any significant mouth or nasal dryness or nasal congestion using her device.  She does not think that he snores.  Unfortunately a piece of her mask broke and she has a new mask on order but cannot use the old mask and therefore has not been using her device for a few weeks.  She says she supposed to get her mask every day.  Unfortunately she fractured her right arm and shoulder recently and has had a lot of problems with sleeping at night due to pain.  She is taking several pain medications and since starting the pain medications is had a dry mouth but prior to that did not.  She says this morning she felt very sluggish and was having difficulty walking feeling like she was shuffling her footsteps.  She is also having a bad dry mouth this morning as well.   Past Medical History:  Diagnosis Date  . Angina   . Arthritis   . Atrial fibrillation (HCC)    ASPIRIN FOR BLOOD THINNER  . Atypical mole 09/22/2016  . Bulging discs    lumbar   . Cancer ([place])    melanoma  . Complication of anesthesia    pt states has choking sensation with ET tube   . Coronary artery disease   . Depression   . Diabetes mellitus    diet controlled/on meds  . Fibromyalgia   . GERD (gastroesophageal reflux disease)   . H/O hiatal hernia   . Headache(784.0)    "recurring"  . High cholesterol   .  Hyperlipidemia   . Hypertension   . 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 Migraines    "til ~ 1980"  . PONV (postoperative nausea and vomiting)   . Restless leg syndrome   . Sciatic nerve pain    "from pinched nerve"  . Sleep apnea    uses CPAP  . Weakness of right side of body    "I've had PT for it; they don't know what it's from".  CORTISONE INJECTION INTO BACK 08/30/12    Past Surgical History:  Procedure Laterality Date  . [place] STUDY N/A 12/29/2015   Procedure: [place] STUDY;  Surgeon: Manus Gunning, MD;  [facility];  Service: Gastroenterology;  Laterality: N/A;  . CARPAL TUNNEL RELEASE Left 07/02/2015   Procedure: CARPAL TUNNEL RELEASE;  Surgeon: Frederik Pear, MD;  Location: [place];  Service: Orthopedics;  Laterality: Left;  . CATARACT EXTRACTION, BILATERAL Bilateral    Oct and Nov 2017  . CORONARY ANGIOPLASTY WITH STENT PLACEMENT  06/2011   "1"  . CORONARY ARTERY BYPASS GRAFT  2005   CABG X 2  . DILATION AND CURETTAGE OF UTERUS     "more than  once"  . ELBOW ARTHROSCOPY Left 07/02/2015   Procedure: ARTHROSCOPY LEFT ELBOW WITH DEBRIDEMENT AND REMOVAL LOOSE BODY;  Surgeon: Frederik Pear, MD;  Location: [place];  Service: Orthopedics;  Laterality: Left;  . ESOPHAGEAL MANOMETRY N/A 12/29/2015   Procedure: ESOPHAGEAL MANOMETRY (EM);  Surgeon: Manus Gunning, MD;  [facility];  Service: Gastroenterology;  Laterality: N/A;  . FRACTURE SURGERY  ~ 2005   nose  . KNEE ARTHROSCOPY  09/04/2012   Procedure: ARTHROSCOPY KNEE;  Surgeon: Gearlean Alf, MD;  [facility];  Service: Orthopedics;  Laterality: Right;  right knee arthroscopy with medial and lateral meniscus debridement  . 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 MOUTH SURGERY  2004   "bone replacement; had cadavear bones put in; face was collapsing"  . NASAL SEPTUM SURGERY  ~ 1986  . TOTAL KNEE ARTHROPLASTY Right 07/07/2013    Procedure: RIGHT TOTAL KNEE ARTHROPLASTY;  Surgeon: Gearlean Alf, MD;  [facility];  Service: Orthopedics;  Laterality: Right;    Current Medications: Current Meds  Medication Sig  . aspirin EC 81 MG tablet Take 1 tablet (81 mg total) by mouth daily.  [name] atorvastatin (LIPITOR) 80 MG tablet TAKE 1 TABLET BY MOUTH EVERY DAY  . CARAFATE 1 GM/10ML suspension TAKE 10 MLS (1 G TOTAL) BY MOUTH EVERY 6 (SIX) HOURS AS NEEDED.  [name] cetirizine (ZYRTEC) 10 MG tablet Take 10 mg by mouth daily. Reported on 12/23/2015  . chlorthalidone (HYGROTON) 25 MG tablet TAKE 1 TABLET BY MOUTH EVERY DAY  . clonazePAM (KLONOPIN) 1 MG tablet TAKE 2 TABLETS (2 MG TOTAL) BY MOUTH AT BEDTIME.  [name] DEXILANT 60 MG capsule TAKE 1 CAPSULE BY MOUTH EVERY DAY  . gabapentin (NEURONTIN) 300 MG capsule TAKE 1 TO 2 CAPSULES BY MOUTH AT BEDTIME  . Insulin Pen Needle (NOVOFINE) 32G X 6 MM MISC Use to inject Victoza daily.  Dx: E11.9  . liraglutide (VICTOZA) 18 MG/3ML SOPN Inject 0.2 mLs (1.2 mg total) into the skin daily.  . meloxicam (MOBIC) 15 MG tablet Take 15 mg by mouth daily. Reported on 12/23/2015  . metFORMIN (GLUCOPHAGE-XR) 500 MG 24 hr tablet Take 2 tablets (1,000 mg total) by mouth 2 (two) times daily.  . metoprolol succinate (TOPROL-XL) 100 MG 24 hr tablet TAKE 1 TABLET (100 MG TOTAL) BY MOUTH DAILY. TAKE WITH OR IMMEDIATELY FOLLOWING A MEAL.  . Multiple Vitamins-Minerals (PRESERVISION AREDS PO) Take 1 tablet by mouth 2 (two) times daily. Reported on 12/23/2015  . MYRBETRIQ 25 MG TB24 tablet TAKE 1 TABLET BY MOUTH EVERY DAY  . nitroGLYCERIN (NITROSTAT) 0.4 MG SL tablet PLACE 1 TABLET (0.4 MG TOTAL) UNDER THE TONGUE EVERY 5 (FIVE) MINUTES AS NEEDED FOR CHEST PAIN  . ONE TOUCH ULTRA TEST test strip CHECK BLOOD SUGAR TWICE A DAY  . ONETOUCH DELICA LANCETS 02D MISC Use to check blood sugar once daily.  Dx: E11.9  . Probiotic Product (PROBIOTIC DAILY PO) Take 1 tablet by mouth daily.   [name] tiZANidine (ZANAFLEX) 4 MG tablet TAKE 1  TABLET BY MOUTH EVERY DAY IN THE EVENING  . traMADol (ULTRAM) 50 MG tablet Take 50 mg by mouth every 6 (six) hours as needed. for pain  . venlafaxine XR (EFFEXOR-XR) 37.5 MG 24 hr capsule TAKE 1 CAPSULE (37.5 MG TOTAL) BY MOUTH 2 (TWO) TIMES DAILY.     Allergies:   Hydrocodone-acetaminophen; Percocet [oxycodone-acetaminophen]; and Sulfa antibiotics   Social History   Socioeconomic History  .  Marital status: Married    Spouse name: Not on file  . Number of children: 2  . Years of education: college  . Highest education level: Not on file  Occupational History  . Occupation: Retired   Scientific laboratory technician  . Financial resource strain: Not on file  . Food insecurity:    Worry: Not on file    Inability: Not on file  . Transportation needs:    Medical: Not on file    Non-medical: Not on file  Tobacco Use  . Smoking status: Never Smoker  . Smokeless tobacco: Never Used  Substance and Sexual Activity  . Alcohol use: Yes    Alcohol/week: 1.0 standard drinks    Types: 1 Glasses of wine per week    Comment: "occasionally drink wine"  . Drug use: No  . Sexual activity: Yes  Lifestyle  . Physical activity:    Days per week: Not on file    Minutes per session: Not on file  . Stress: Not on file  Relationships  . Social connections:    Talks on phone: Not on file    Gets together: Not on file    Attends religious service: Not on file    Active member of club or organization: Not on file    Attends meetings of clubs or organizations: Not on file    Relationship status: Not on file  Other Topics Concern  . Not on file  Social History Narrative   Drinks 1 cup of coffee a day      Family History: The patient's family history includes Breast cancer (age of onset: 68) in her mother; Cancer (age of onset: 60) in her mother and sister; Colon cancer in her sister; Dementia in her sister; Diabetes in her sister; GER disease in her daughter; Heart disease in her brother; Heart disease (age of  onset: 58) in her father; Hypertension in her brother, daughter, and sister. There is no history of Heart attack or Stroke.  ROS:   Please see the history of present illness.    ROS  All other systems reviewed and negative.   EKGs/Labs/Other Studies Reviewed:    The following studies were reviewed today: PAP download  EKG:  EKG is not ordered today  Recent Labs: 06/17/2018: ALT 19; BUN 20; Creatinine, Ser 0.85; Potassium 4.0; Sodium 143   Recent Lipid Panel    Component Value Date/Time   CHOL 117 06/17/2018 0953   TRIG 73.0 06/17/2018 0953   HDL 49.50 06/17/2018 0953   CHOLHDL 2 06/17/2018 0953   VLDL 14.6 06/17/2018 0953   LDLCALC 53 06/17/2018 0953    Physical Exam:    VS:  BP (!) 92/48   Pulse 69   Ht 5' 2.25" (1.581 m)   Wt 154 lb 12.8 oz (70.2 kg)   SpO2 96%   BMI 28.09 kg/m     Wt Readings from Last 3 Encounters:  10/14/18 154 lb 12.8 oz (70.2 kg)  10/01/18 154 lb 6.4 oz (70 kg)  08/05/18 151 lb (68.5 kg)     GEN:  Well nourished, well developed in no acute distress HEENT: Normal NECK: No JVD; No carotid bruits LYMPHATICS: No lymphadenopathy CARDIAC: RRR, no murmurs, rubs, gallops RESPIRATORY:  Clear to auscultation without rales, wheezing or rhonchi  ABDOMEN: Soft, non-tender, non-distended MUSCULOSKELETAL:  No edema; No deformity  SKIN: Warm and dry NEUROLOGIC:  Alert and oriented x 3 PSYCHIATRIC:  Normal affect   ASSESSMENT:    1. Obstructive sleep  apnea   2. Essential hypertension   3. Obesity (BMI 30-39.9)    PLAN:    In order of problems listed above:  1.  OSA - the patient is tolerating PAP therapy well without any problems. The PAP download was reviewed today and showed an AHI of 5.8/hr on 14 cm H2O with 23% compliance in using more than 4 hours nightly.  The patient has been using and benefiting from PAP use and will continue to benefit from therapy.  Her compliance is down because a piece broke off her mask and they had to order a whole  new mask for her to use.  She has not received her new mask yet.  Prior to the piece breaking she was using her device every night and very compliant with using more than 4 hours a night.  2.  HTN -BP is well controlled on exam today and is actually on the soft side.  Today she has felt sluggish and had some problems with being off balance and dry mouth.  I suspect she is dehydrated especially given the fact that her blood pressure is low today.[name]  She will continue on Toprol-XL 100 mg daily have instructed her to stop her chlorthalidone.  I encouraged her to go home today and increase fluid intake and if she is still having symptoms this afternoon then I instructed her to call her PCP.  Creatinine was stable at 0.85 on 06/17/2018 and potassium 4.  3.  Obesity - I have encouraged her to get into a routine exercise program and cut back on carbs and portions.      Medication Adjustments/Labs and Tests Ordered: Current medicines are reviewed at length with the patient today.  Concerns regarding medicines are outlined above.  No orders of the defined types were placed in this encounter.  No orders of the defined types were placed in this encounter.   Signed, Fransico Him, MD  10/14/2018 9:54 AM    [place]

## 2018-10-15 ENCOUNTER — Other Ambulatory Visit: Payer: Self-pay | Admitting: Cardiology

## 2018-10-16 ENCOUNTER — Ambulatory Visit: Payer: Medicare Other

## 2018-10-16 DIAGNOSIS — E119 Type 2 diabetes mellitus without complications: Secondary | ICD-10-CM

## 2018-10-16 MED ORDER — LANCET DEVICE MISC: 0 refills | Status: AC

## 2018-10-16 MED ORDER — FREESTYLE LANCETS MISC: 11 refills | Status: AC

## 2018-10-16 MED ORDER — GLUCOSE BLOOD VI STRP: ORAL_STRIP | 3 refills | Status: DC

## 2018-10-17 ENCOUNTER — Telehealth: Payer: Self-pay

## 2018-10-17 NOTE — Telephone Encounter (Signed)
Blood Pressure values 12/3: 145/81 12/5: 143/76, 84 HR

## 2018-10-18 ENCOUNTER — Ambulatory Visit: Payer: Medicare Other | Attending: Orthopedic Surgery

## 2018-10-18 VITALS — BP 175/71 | HR 66

## 2018-10-18 DIAGNOSIS — M25511 Pain in right shoulder: Secondary | ICD-10-CM | POA: Diagnosis not present

## 2018-10-18 DIAGNOSIS — M25611 Stiffness of right shoulder, not elsewhere classified: Secondary | ICD-10-CM | POA: Diagnosis present

## 2018-10-18 DIAGNOSIS — M6281 Muscle weakness (generalized): Secondary | ICD-10-CM | POA: Insufficient documentation

## 2018-10-18 NOTE — Therapy (Signed)
[place] MAIN [facility] SERVICES [address] [place], [place], [address] Phone: [phone]   Fax:  [phone]  Physical Therapy Treatment  Patient Details  Name: Morgan [name] MRN: [id number] Date of Birth: 01-Jul-1941 Referring Provider (PT): Dr. Mayer Camel   Encounter Date: 10/18/2018  PT End of Session - 10/18/18 0856    Visit Number  4    Number of Visits  17    Date for PT Re-Evaluation  11/25/18    Authorization Type  Progress note: 4/10, Last goals: 09/30/18 (eval)    PT Start Time  0853    PT Stop Time  0935    PT Time Calculation (min)  42 min    Activity Tolerance  Patient limited by pain    Behavior During Therapy  [facility] for tasks assessed/performed       Past Medical History:  Diagnosis Date  . Angina   . Arthritis   . Atrial fibrillation (HCC)    ASPIRIN FOR BLOOD THINNER  . Atypical mole 09/22/2016  . Bulging discs    lumbar   . Cancer ([place])    melanoma  . Complication of anesthesia    pt states has choking sensation with ET tube   . Coronary artery disease   . Depression   . Diabetes mellitus    diet controlled/on meds  . Fibromyalgia   . GERD (gastroesophageal reflux disease)   . H/O hiatal hernia   . Headache(784.0)    "recurring"  . High cholesterol   . Hyperlipidemia   . Hypertension   . 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 Migraines    "til ~ 1980"  . PONV (postoperative nausea and vomiting)   . Restless leg syndrome   . Sciatic nerve pain    "from pinched nerve"  . Sleep apnea    uses CPAP  . Weakness of right side of body    "I've had PT for it; they don't know what it's from".  CORTISONE INJECTION INTO BACK 08/30/12    Past Surgical History:  Procedure Laterality Date  . [place] STUDY N/A 12/29/2015   Procedure: [place] STUDY;  Surgeon: Manus Gunning, MD;  [facility];  Service: Gastroenterology;  Laterality: N/A;  . CARPAL TUNNEL RELEASE Left 07/02/2015   Procedure: CARPAL TUNNEL  RELEASE;  Surgeon: Frederik Pear, MD;  Location: [place];  Service: Orthopedics;  Laterality: Left;  . CATARACT EXTRACTION, BILATERAL Bilateral    Oct and Nov 2017  . CORONARY ANGIOPLASTY WITH STENT PLACEMENT  06/2011   "1"  . CORONARY ARTERY BYPASS GRAFT  2005   CABG X 2  . DILATION AND CURETTAGE OF UTERUS     "more than once"  . ELBOW ARTHROSCOPY Left 07/02/2015   Procedure: ARTHROSCOPY LEFT ELBOW WITH DEBRIDEMENT AND REMOVAL LOOSE BODY;  Surgeon: Frederik Pear, MD;  Location: [place];  Service: Orthopedics;  Laterality: Left;  . ESOPHAGEAL MANOMETRY N/A 12/29/2015   Procedure: ESOPHAGEAL MANOMETRY (EM);  Surgeon: Manus Gunning, MD;  [facility];  Service: Gastroenterology;  Laterality: N/A;  . FRACTURE SURGERY  ~ 2005   nose  . KNEE ARTHROSCOPY  09/04/2012   Procedure: ARTHROSCOPY KNEE;  Surgeon: Gearlean Alf, MD;  [facility];  Service: Orthopedics;  Laterality: Right;  right knee arthroscopy with medial and lateral meniscus debridement  . 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 MOUTH SURGERY  2004   "  bone replacement; had cadavear bones put in; face was collapsing"  . NASAL SEPTUM SURGERY  ~ 1986  . TOTAL KNEE ARTHROPLASTY Right 07/07/2013   Procedure: RIGHT TOTAL KNEE ARTHROPLASTY;  Surgeon: Gearlean Alf, MD;  [facility];  Service: Orthopedics;  Laterality: Right;    Vitals:   10/18/18 0900  BP: (!) 175/71  Pulse: 66  SpO2: 100%    Subjective Assessment - 10/18/18 0855    Subjective  Pt reports that she woke up the other day and when she took her blood pressure it was in the 81'E systolic and 56'D diastolic. She was having trouble taking steps. She had an appointment to see her cardiologist that manages her OSA and upon arrival the RN couldn't get her blood pressure. They finally were able to get a reading and it was low so she states that they had her lay down and tried to get her blood  pressure to improve. Pt states that it eventually improved and she was discharged home with there husband. She states that the MD stated she might have had a TIA. Since that time she has noticed that she continues to move slower. She has been trying to stay well hydrated and denies any other focal neurological symptoms upon questioning. She saw her orthopedist yesterday and was told to continue with physical therapy and follow-up in 4 weeks.     Pertinent History  On 08/03/18 pt was carrying a folding table and fell and right arm was under the handle got stuck between the door and the table. She landed on her R side. Tried to get up and had to pull at her arm. Pt had immediate pain in her right shoulder. She waiteduntil Monday and went to see Dr. Edilia Bo her PCP. He took radiographs but didn't find any fractures. She continued to have pain so she called Dr. Damita Dunnings office, her orthopedist, and saw him that same week. He took additional radiographs and found a hairline fracture in her humeral shaft and a greater tuberosity fracture per patient report. She states that she is not healing well and has recurrent plain film radiographs. She is still wearing a sling and was encouraged not to perform any AROM of her right shoulder. Since her last visit with Dr. Mayer Camel on 09/20/18 she has had an additional fall and landed on her R shoulder. She reports additional bruising and pain in her R shoulder. She has a follow-up appt scheduled with her orthopedist for 10/15/18. 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 ROS negative for red flags.    Limitations  Lifting    Diagnostic tests  Plain film radiographs with decreased healing per pt report    Patient Stated Goals  Improve ability to use RUE and decrease pain    Currently in Pain?  Yes    Pain Score  5     Pain Location  Shoulder    Pain Orientation  Right    Pain Descriptors / Indicators  Sharp    Pain Type  Chronic pain    Pain Onset  1  to 4 weeks ago    Pain Frequency  Constant           TREATMENT   Ther-ex Moist heat pack applied to R shoulder at start of session during history x 5 minutes; Supine cervical retractions 5s hold x 10, verbal as well as tactile cues required; AAROM canes for flexion, attempted short lever arm but  pt unable to perform so advanced to elbows straight x 5. Pt struggles significantly to relax R shoulder;   Manual Therapy R shoulder PROM flexion, scaption, abduction, IR, and ER with end range holds. Pt able to gradually allow for more PROM with cues to relax and dead fish mobs of R shoulder; IFC applied to R shoulder during sessionat pt tolerated intensity of 7.8V;    Pt educated throughout session about proper posture and technique withpendulum hangs.Improved technique, movement at target joints, after min to mod verbal, visual, tactile cues.    Pt continues to struggle significantly to relax R shoulder during PROM and AAROM canes but improves slightly with repetition. PROM is more limited today than in prior sessions. She reports headache pain in rams horn pattern up R side of neck and around R ear. Will consider STM to R upper trap as this may be referred pain. Encouraged pt to follow-up as scheduled and initiate canes for HEP.Pt will benefit from PT services to address deficits in strength,mobility, and painin order to return to full function at home.                       PT Short Term Goals - 09/30/18 1021      PT SHORT TERM GOAL #1   Title  Pt will be independent with HEP in order to improve strength and decrease pain in order to improve pain-free function at home.    Time  4    Period  Weeks    Status  New    Target Date  10/28/18        PT Long Term Goals - 09/30/18 1027      PT LONG TERM GOAL #1   Title  Pt will decrease quick DASH score by at least 8% in order to demonstrate clinically significant reduction in disability.    Baseline   09/30/18: 84.1%    Time  8    Period  Weeks    Status  New    Target Date  11/25/18      PT LONG TERM GOAL #2   Title  Pt will decrease worst pain as reported on NPRS by at least 3 points in order to demonstrate clinically significant reduction in pain.    Baseline  09/30/18: worst: 9/10    Time  8    Period  Weeks    Status  New    Target Date  11/25/18      PT LONG TERM GOAL #3   Title  Pt will improve PROM of R shoulder to within 10 degrees of R shoulder in order to improve functional use of R shoulder for ADLs/IADLs.    Baseline  09/30/18: R/L 129/150 Shoulder flexion, 100/165 Shoulder abduction, 50/93 Shoulder external rotation, 50/70 Shoulder internal rotation    Time  8    Period  Weeks    Status  New    Target Date  11/25/18            Plan - 10/18/18 0856    Clinical Impression Statement  Pt continues to struggle significantly to relax R shoulder during PROM and AAROM canes but improves slightly with repetition. PROM is more limited today than in prior sessions. She reports headache pain in rams horn pattern up R side of neck and around R ear. Will consider STM to R upper trap as this may be referred pain. Encouraged pt to follow-up as scheduled and initiate canes for  HEP.Pt will benefit from PT services to address deficits in strength,mobility, and painin order to return to full function at home.     Rehab Potential  Good    PT Frequency  2x / week    PT Duration  8 weeks    PT Treatment/Interventions  ADLs/Self Care Home Management;Aquatic Therapy;Canalith Repostioning;Cryotherapy;Electrical Stimulation;Iontophoresis 4mg /ml Dexamethasone;Moist Heat;Traction;Ultrasound;Contrast Bath;DME Instruction;Gait training;Stair training;Functional mobility training;Therapeutic activities;Therapeutic exercise;Balance training;Neuromuscular re-education;Patient/family education;Manual techniques;Passive range of motion;Dry needling;Splinting;Taping;Vestibular    PT Next Visit Plan   Review HEP and progress, Progress PROM/AAROM of shoulder    PT Home Exercise Plan  Cervical retractions, Scapular retractions, R shoulder hangs of side of table, R upper trap stretch, supine canes for AAROM flexion with hands together    Consulted and Agree with Plan of Care  Patient       Patient will benefit from skilled therapeutic intervention in order to improve the following deficits and impairments:  Decreased strength, Decreased range of motion, Impaired UE functional use, Pain  Visit Diagnosis: Acute pain of right shoulder  Stiffness of right shoulder, not elsewhere classified  Muscle weakness (generalized)     Problem List Patient Active Problem List   Diagnosis Date Noted  . Malignant melanoma ([place]) 11/23/2016  . Urge incontinence 11/23/2016  . Atypical mole 09/22/2016  . Slurring of speech 05/12/2016  . Traumatic arthritis elbow 05/04/2015  . Left foot pain 12/31/2014  . Severe sprain of left ankle 12/31/2014  . Osteopenia 09/15/2014  . History of TIA (transient ischemic attack) 05/19/2014  . Fracture of coronoid process of ulna, left, closed 04/25/2014  . Ingrown toenail 01/15/2014  . Obesity (BMI 30-39.9) 10/08/2013  . OA (osteoarthritis) of knee 07/07/2013  . Chronic low back pain 05/09/2013  . Obstructive sleep apnea 05/09/2013  . GERD (gastroesophageal reflux disease) 05/09/2013  . Hiatal hernia 05/09/2013  . Fibromyalgia syndrome 05/09/2013  . Depression, major, in partial remission ([place]) 05/09/2013  . RLS (restless legs syndrome) 05/09/2013  . Diabetes mellitus with no complication ([place]) 56/38/7564  . High cholesterol 05/09/2013  . 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 Family history of colon cancer 05/09/2013  . Allergic rhinitis 05/09/2013  . Lateral meniscal tear 09/04/2012  . CAD (coronary artery disease) 09/27/2011  . HTN (hypertension) 09/27/2011  . Paroxysmal A-fib ([place]) 09/27/2011   Phillips Grout PT, DPT, GCS  Zarinah Oviatt 10/18/2018,  12:08 PM  [place] MAIN [facility] SERVICES [address] [place], [place], [address] Phone: [phone]   Fax:  [phone]  Name: Tiffny Gemmer MRN: [id number] Date of Birth: May 28, 1941

## 2018-10-18 NOTE — Patient Instructions (Signed)
Access Code: [email or web address]  URL: [email or web address]  Date: 10/18/2018  Prepared by: Roxana Hires   Exercises  Supine Shoulder Flexion with Dowel - 10 reps - 1 sets - 5 seconds hold - 2x daily - 7x weekly

## 2018-10-21 ENCOUNTER — Ambulatory Visit: Payer: Medicare Other

## 2018-10-21 DIAGNOSIS — M25511 Pain in right shoulder: Secondary | ICD-10-CM | POA: Diagnosis not present

## 2018-10-21 DIAGNOSIS — M25611 Stiffness of right shoulder, not elsewhere classified: Secondary | ICD-10-CM

## 2018-10-21 DIAGNOSIS — M6281 Muscle weakness (generalized): Secondary | ICD-10-CM

## 2018-10-21 NOTE — Telephone Encounter (Signed)
Subject: RE: Non-Urgent Medical Question         BP is stable  [name]

## 2018-10-21 NOTE — Therapy (Signed)
[place] MAIN [facility] SERVICES [address] [place], [place], [address] Phone: [phone]   Fax:  [phone]  Physical Therapy Treatment  Patient Details  Name: Morgan [name] MRN: [id number] Date of Birth: [birth year] Referring Provider (PT): Dr. Mayer Camel   Encounter Date: 10/21/2018  PT End of Session - 10/21/18 1254    Visit Number  5    Number of Visits  17    Date for PT Re-Evaluation  11/25/18    Authorization Type  Progress note: 4/10, Last goals: 09/30/18 (eval)    PT Start Time  1000    PT Stop Time  1029    PT Time Calculation (min)  29 min    Activity Tolerance  Patient limited by pain    Behavior During Therapy  [facility] for tasks assessed/performed       Past Medical History:  Diagnosis Date  . Angina   . Arthritis   . Atrial fibrillation (HCC)    ASPIRIN FOR BLOOD THINNER  . Atypical mole 09/22/2016  . Bulging discs    lumbar   . Cancer ([place])    melanoma  . Complication of anesthesia    pt states has choking sensation with ET tube   . Coronary artery disease   . Depression   . Diabetes mellitus    diet controlled/on meds  . Fibromyalgia   . GERD (gastroesophageal reflux disease)   . H/O hiatal hernia   . Headache(784.0)    "recurring"  . High cholesterol   . Hyperlipidemia   . Hypertension   . 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 Migraines    "til ~ 1980"  . PONV (postoperative nausea and vomiting)   . Restless leg syndrome   . Sciatic nerve pain    "from pinched nerve"  . Sleep apnea    uses CPAP  . Weakness of right side of body    "I've had PT for it; they don't know what it's from".  CORTISONE INJECTION INTO BACK 08/30/12    Past Surgical History:  Procedure Laterality Date  . [place] STUDY N/A 12/29/2015   Procedure: [place] STUDY;  Surgeon: Manus Gunning, MD;  [facility];  Service: Gastroenterology;  Laterality: N/A;  . CARPAL TUNNEL RELEASE Left 07/02/2015   Procedure: CARPAL TUNNEL  RELEASE;  Surgeon: Frederik Pear, MD;  Location: [place];  Service: Orthopedics;  Laterality: Left;  . CATARACT EXTRACTION, BILATERAL Bilateral    Oct and Nov 2017  . CORONARY ANGIOPLASTY WITH STENT PLACEMENT  06/2011   "1"  . CORONARY ARTERY BYPASS GRAFT  2005   CABG X 2  . DILATION AND CURETTAGE OF UTERUS     "more than once"  . ELBOW ARTHROSCOPY Left 07/02/2015   Procedure: ARTHROSCOPY LEFT ELBOW WITH DEBRIDEMENT AND REMOVAL LOOSE BODY;  Surgeon: Frederik Pear, MD;  Location: [place];  Service: Orthopedics;  Laterality: Left;  . ESOPHAGEAL MANOMETRY N/A 12/29/2015   Procedure: ESOPHAGEAL MANOMETRY (EM);  Surgeon: Manus Gunning, MD;  [facility];  Service: Gastroenterology;  Laterality: N/A;  . FRACTURE SURGERY  ~ 2005   nose  . KNEE ARTHROSCOPY  09/04/2012   Procedure: ARTHROSCOPY KNEE;  Surgeon: Gearlean Alf, MD;  [facility];  Service: Orthopedics;  Laterality: Right;  right knee arthroscopy with medial and lateral meniscus debridement  . 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 MOUTH SURGERY  2004   "  bone replacement; had cadavear bones put in; face was collapsing"  . NASAL SEPTUM SURGERY  ~ 1986  . TOTAL KNEE ARTHROPLASTY Right 07/07/2013   Procedure: RIGHT TOTAL KNEE ARTHROPLASTY;  Surgeon: Gearlean Alf, MD;  [facility];  Service: Orthopedics;  Laterality: Right;    There were no vitals filed for this visit.  Subjective Assessment - 10/21/18 1002    Subjective  Pt reported that her pain was 7/10 for 3 or 4 hours last night, that she had to get up and go sleep in a recliner. Reported that it is improved this AM but still has some pain.     Pertinent History  On 08/03/18 pt was carrying a folding table and fell and right arm was under the handle got stuck between the door and the table. She landed on her R side. Tried to get up and had to pull at her arm. Pt had immediate pain in her right shoulder.  She waiteduntil Monday and went to see Dr. Edilia Bo her PCP. He took radiographs but didn't find any fractures. She continued to have pain so she called Dr. Damita Dunnings office, her orthopedist, and saw him that same week. He took additional radiographs and found a hairline fracture in her humeral shaft and a greater tuberosity fracture per patient report. She states that she is not healing well and has recurrent plain film radiographs. She is still wearing a sling and was encouraged not to perform any AROM of her right shoulder. Since her last visit with Dr. Mayer Camel on 09/20/18 she has had an additional fall and landed on her R shoulder. She reports additional bruising and pain in her R shoulder. She has a follow-up appt scheduled with her orthopedist for 10/15/18. 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 ROS negative for red flags.    Currently in Pain?  Yes    Pain Score  2     Pain Location  Shoulder    Pain Orientation  Right    Pain Descriptors / Indicators  Sharp    Pain Type  Chronic pain       Ther-ex  Supine cervical retractions 5s hold x 10, verbal as well as tactile cues required; AAROM with hands clasped 2x10 into shoulder elevation (x10 prior to PROM x 10 post PROM) R shoulder PROM flexion, scaption, abduction, IR, and ER with end range holds. Difficulty with PROM due to increased muscle guarding, some improvement with mod verbal/tactile cues.      Manual Therapy  STM to R upper trap, superficial and deep techniques utilized. Pt complained of tenderness/soreness, though improved after STM. Tissue integrity improved by 25%.     PT Education - 10/21/18 1003    Education Details  therex, STM    Person(s) Educated  Patient    Methods  Explanation       PT Short Term Goals - 09/30/18 1021      PT SHORT TERM GOAL #1   Title  Pt will be independent with HEP in order to improve strength and decrease pain in order to improve pain-free function at home.     Time  4    Period  Weeks    Status  New    Target Date  10/28/18        PT Long Term Goals - 09/30/18 1027      PT LONG TERM GOAL #1   Title  Pt will decrease quick DASH score  by at least 8% in order to demonstrate clinically significant reduction in disability.    Baseline  09/30/18: 84.1%    Time  8    Period  Weeks    Status  New    Target Date  11/25/18      PT LONG TERM GOAL #2   Title  Pt will decrease worst pain as reported on NPRS by at least 3 points in order to demonstrate clinically significant reduction in pain.    Baseline  09/30/18: worst: 9/10    Time  8    Period  Weeks    Status  New    Target Date  11/25/18      PT LONG TERM GOAL #3   Title  Pt will improve PROM of R shoulder to within 10 degrees of R shoulder in order to improve functional use of R shoulder for ADLs/IADLs.    Baseline  09/30/18: R/L 129/150 Shoulder flexion, 100/165 Shoulder abduction, 50/93 Shoulder external rotation, 50/70 Shoulder internal rotation    Time  8    Period  Weeks    Status  New    Target Date  11/25/18            Plan - 10/21/18 1253    Clinical Impression Statement  Pt unable to fully relax for PROM exercises today. Some improvement noted in AAROM after PROM, still complaints of superior lateral shoulder pain with movement. Pt did endorse decreased pain after STM to R upper trap.     Rehab Potential  Good    PT Frequency  2x / week    PT Duration  8 weeks    PT Treatment/Interventions  ADLs/Self Care Home Management;Aquatic Therapy;Canalith Repostioning;Cryotherapy;Electrical Stimulation;Iontophoresis 4mg /ml Dexamethasone;Moist Heat;Traction;Ultrasound;Contrast Bath;DME Instruction;Gait training;Stair training;Functional mobility training;Therapeutic activities;Therapeutic exercise;Balance training;Neuromuscular re-education;Patient/family education;Manual techniques;Passive range of motion;Dry needling;Splinting;Taping;Vestibular    PT Next Visit Plan  Review HEP and  progress, Progress PROM/AAROM of shoulder    PT Home Exercise Plan  Cervical retractions, Scapular retractions, R shoulder hangs of side of table, R upper trap stretch, supine canes for AAROM flexion with hands together    Consulted and Agree with Plan of Care  Patient       Patient will benefit from skilled therapeutic intervention in order to improve the following deficits and impairments:  Decreased strength, Decreased range of motion, Impaired UE functional use, Pain  Visit Diagnosis: Acute pain of right shoulder  Stiffness of right shoulder, not elsewhere classified  Muscle weakness (generalized)     Problem List Patient Active Problem List   Diagnosis Date Noted  . Malignant melanoma ([place]) 11/23/2016  . Urge incontinence 11/23/2016  . Atypical mole 09/22/2016  . Slurring of speech 05/12/2016  . Traumatic arthritis elbow 05/04/2015  . Left foot pain 12/31/2014  . Severe sprain of left ankle 12/31/2014  . Osteopenia 09/15/2014  . History of TIA (transient ischemic attack) 05/19/2014  . Fracture of coronoid process of ulna, left, closed 04/25/2014  . Ingrown toenail 01/15/2014  . Obesity (BMI 30-39.9) 10/08/2013  . OA (osteoarthritis) of knee 07/07/2013  . Chronic low back pain 05/09/2013  . Obstructive sleep apnea 05/09/2013  . GERD (gastroesophageal reflux disease) 05/09/2013  . Hiatal hernia 05/09/2013  . Fibromyalgia syndrome 05/09/2013  . Depression, major, in partial remission ([place]) 05/09/2013  . RLS (restless legs syndrome) 05/09/2013  . Diabetes mellitus with no complication ([place]) 48/18/5631  . High cholesterol 05/09/2013  . 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 Family history of colon cancer  05/09/2013  . Allergic rhinitis 05/09/2013  . Lateral meniscal tear 09/04/2012  . CAD (coronary artery disease) 09/27/2011  . HTN (hypertension) 09/27/2011  . Paroxysmal A-fib [facility]) 09/27/2011    Lieutenant Diego PT, DPT 12:57 PM,10/21/18 [place] MAIN [facility] SERVICES [address] [place], [place], [address] Phone: [phone]   Fax:  [phone]  Name: Morgan [name] MRN: [id number] Date of Birth: 19-Feb-1941

## 2018-10-21 NOTE — Telephone Encounter (Signed)
Spoke with the patient, she expressed understanding about Dr. Theodosia Blender recommendations about Blood pressure.

## 2018-10-23 ENCOUNTER — Ambulatory Visit: Payer: Medicare Other

## 2018-10-23 DIAGNOSIS — M25511 Pain in right shoulder: Secondary | ICD-10-CM | POA: Diagnosis not present

## 2018-10-23 DIAGNOSIS — M6281 Muscle weakness (generalized): Secondary | ICD-10-CM

## 2018-10-23 DIAGNOSIS — M25611 Stiffness of right shoulder, not elsewhere classified: Secondary | ICD-10-CM

## 2018-10-23 NOTE — Therapy (Signed)
[place] MAIN [facility] SERVICES [address] [place], [place], [address] Phone: [phone]   Fax:  [phone]  Physical Therapy Treatment  Patient Details  Name: Morgan [name] MRN: [id number] Date of Birth: 1941/02/10 Referring Provider (PT): Dr. Mayer Camel   Encounter Date: 10/23/2018  PT End of Session - 10/23/18 0857    Visit Number  6    Number of Visits  17    Date for PT Re-Evaluation  11/25/18    Authorization Type  Progress note: 5/10, Last goals: 09/30/18 (eval)    PT Start Time  0808    PT Stop Time  0847    PT Time Calculation (min)  39 min    Activity Tolerance  Patient limited by pain    Behavior During Therapy  [facility] for tasks assessed/performed       Past Medical History:  Diagnosis Date  . Angina   . Arthritis   . Atrial fibrillation (HCC)    ASPIRIN FOR BLOOD THINNER  . Atypical mole 09/22/2016  . Bulging discs    lumbar   . Cancer ([place])    melanoma  . Complication of anesthesia    pt states has choking sensation with ET tube   . Coronary artery disease   . Depression   . Diabetes mellitus    diet controlled/on meds  . Fibromyalgia   . GERD (gastroesophageal reflux disease)   . H/O hiatal hernia   . Headache(784.0)    "recurring"  . High cholesterol   . Hyperlipidemia   . Hypertension   . 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 Migraines    "til ~ 1980"  . PONV (postoperative nausea and vomiting)   . Restless leg syndrome   . Sciatic nerve pain    "from pinched nerve"  . Sleep apnea    uses CPAP  . Weakness of right side of body    "I've had PT for it; they don't know what it's from".  CORTISONE INJECTION INTO BACK 08/30/12    Past Surgical History:  Procedure Laterality Date  . [place] STUDY N/A 12/29/2015   Procedure: [place] STUDY;  Surgeon: Manus Gunning, MD;  [facility];  Service: Gastroenterology;  Laterality: N/A;  . CARPAL TUNNEL RELEASE Left 07/02/2015   Procedure: CARPAL  TUNNEL RELEASE;  Surgeon: Frederik Pear, MD;  Location: [place];  Service: Orthopedics;  Laterality: Left;  . CATARACT EXTRACTION, BILATERAL Bilateral    Oct and Nov 2017  . CORONARY ANGIOPLASTY WITH STENT PLACEMENT  06/2011   "1"  . CORONARY ARTERY BYPASS GRAFT  2005   CABG X 2  . DILATION AND CURETTAGE OF UTERUS     "more than once"  . ELBOW ARTHROSCOPY Left 07/02/2015   Procedure: ARTHROSCOPY LEFT ELBOW WITH DEBRIDEMENT AND REMOVAL LOOSE BODY;  Surgeon: Frederik Pear, MD;  Location: [place];  Service: Orthopedics;  Laterality: Left;  . ESOPHAGEAL MANOMETRY N/A 12/29/2015   Procedure: ESOPHAGEAL MANOMETRY (EM);  Surgeon: Manus Gunning, MD;  [facility];  Service: Gastroenterology;  Laterality: N/A;  . FRACTURE SURGERY  ~ 2005   nose  . KNEE ARTHROSCOPY  09/04/2012   Procedure: ARTHROSCOPY KNEE;  Surgeon: Gearlean Alf, MD;  [facility];  Service: Orthopedics;  Laterality: Right;  right knee arthroscopy with medial and lateral meniscus debridement  . 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 MOUTH SURGERY  2004   "  bone replacement; had cadavear bones put in; face was collapsing"  . NASAL SEPTUM SURGERY  ~ 1986  . TOTAL KNEE ARTHROPLASTY Right 07/07/2013   Procedure: RIGHT TOTAL KNEE ARTHROPLASTY;  Surgeon: Gearlean Alf, MD;  [facility];  Service: Orthopedics;  Laterality: Right;    There were no vitals filed for this visit.  Subjective Assessment - 10/23/18 0851    Subjective  Patient reports she is only able to sleep about an hour or so in bed at night and then has to get up and go sleep in recliner. Reports she has been decorating for christmas with her husband helping.     Pertinent History  On 08/03/18 pt was carrying a folding table and fell and right arm was under the handle got stuck between the door and the table. She landed on her R side. Tried to get up and had to pull at her arm. Pt had immediate  pain in her right shoulder. She waiteduntil Monday and went to see Dr. Edilia Bo her PCP. He took radiographs but didn't find any fractures. She continued to have pain so she called Dr. Damita Dunnings office, her orthopedist, and saw him that same week. He took additional radiographs and found a hairline fracture in her humeral shaft and a greater tuberosity fracture per patient report. She states that she is not healing well and has recurrent plain film radiographs. She is still wearing a sling and was encouraged not to perform any AROM of her right shoulder. Since her last visit with Dr. Mayer Camel on 09/20/18 she has had an additional fall and landed on her R shoulder. She reports additional bruising and pain in her R shoulder. She has a follow-up appt scheduled with her orthopedist for 10/15/18. 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 ROS negative for red flags.    Currently in Pain?  Yes    Pain Score  2     Pain Location  Shoulder    Pain Orientation  Right    Pain Descriptors / Indicators  Aching;Sharp    Pain Type  Chronic pain    Pain Onset  1 to 4 weeks ago    Pain Frequency  Constant       Treat:   Supine:  AAROM with hands clasped 2x10 into shoulder elevation (x10 prior to PROM x 10 post PROM)  R shoulder PROM flexion, scaption, abduction, IR, and ER with end range holds. Difficulty with PROM due to increased muscle guarding, some improvement with mod verbal/tactile cues.   STM to R upper trap, superficial and deep techniques utilized. Pt complained of tenderness/soreness, though improved after STM. Tissue integrity improved by 25%.   Rhythmic perturbations for reduction of tension : intermittently throughout session.    Bicep STM/trigger point with movement   Seated:  Scapular retraction and depression 2x2 minutes with min A    Sleeping position: Educated on use of putting a pillow/towel under elbow.                   PT Education -  10/23/18 0856    Education Details  therex, manual     Person(s) Educated  Patient    Methods  Explanation;Demonstration;Verbal cues;Tactile cues    Comprehension  Verbalized understanding;Returned demonstration;Need further instruction       PT Short Term Goals - 09/30/18 1021      PT SHORT TERM GOAL #1   Title  Pt will be independent  with HEP in order to improve strength and decrease pain in order to improve pain-free function at home.    Time  4    Period  Weeks    Status  New    Target Date  10/28/18        PT Long Term Goals - 09/30/18 1027      PT LONG TERM GOAL #1   Title  Pt will decrease quick DASH score by at least 8% in order to demonstrate clinically significant reduction in disability.    Baseline  09/30/18: 84.1%    Time  8    Period  Weeks    Status  New    Target Date  11/25/18      PT LONG TERM GOAL #2   Title  Pt will decrease worst pain as reported on NPRS by at least 3 points in order to demonstrate clinically significant reduction in pain.    Baseline  09/30/18: worst: 9/10    Time  8    Period  Weeks    Status  New    Target Date  11/25/18      PT LONG TERM GOAL #3   Title  Pt will improve PROM of R shoulder to within 10 degrees of R shoulder in order to improve functional use of R shoulder for ADLs/IADLs.    Baseline  09/30/18: R/L 129/150 Shoulder flexion, 100/165 Shoulder abduction, 50/93 Shoulder external rotation, 50/70 Shoulder internal rotation    Time  8    Period  Weeks    Status  New    Target Date  11/25/18            Plan - 10/23/18 0858    Clinical Impression Statement  Patient educated on sleeping position to allow patient to sleep for longer durations on bed. Educated on use of putting a pillow/towel under elbow. Patient challenged with relaxation of musculature due to guarding from fear of pain. Pt will benefit from PT services to address deficits in strength,mobility, and painin order to return to full function at home.      Rehab Potential  Good    PT Frequency  2x / week    PT Duration  8 weeks    PT Treatment/Interventions  ADLs/Self Care Home Management;Aquatic Therapy;Canalith Repostioning;Cryotherapy;Electrical Stimulation;Iontophoresis 4mg /ml Dexamethasone;Moist Heat;Traction;Ultrasound;Contrast Bath;DME Instruction;Gait training;Stair training;Functional mobility training;Therapeutic activities;Therapeutic exercise;Balance training;Neuromuscular re-education;Patient/family education;Manual techniques;Passive range of motion;Dry needling;Splinting;Taping;Vestibular    PT Next Visit Plan  Review HEP and progress, Progress PROM/AAROM of shoulder    PT Home Exercise Plan  Cervical retractions, Scapular retractions, R shoulder hangs of side of table, R upper trap stretch, supine canes for AAROM flexion with hands together    Consulted and Agree with Plan of Care  Patient       Patient will benefit from skilled therapeutic intervention in order to improve the following deficits and impairments:  Decreased strength, Decreased range of motion, Impaired UE functional use, Pain  Visit Diagnosis: Acute pain of right shoulder  Stiffness of right shoulder, not elsewhere classified  Muscle weakness (generalized)     Problem List Patient Active Problem List   Diagnosis Date Noted  . Malignant melanoma ([place]) 11/23/2016  . Urge incontinence 11/23/2016  . Atypical mole 09/22/2016  . Slurring of speech 05/12/2016  . Traumatic arthritis elbow 05/04/2015  . Left foot pain 12/31/2014  . Severe sprain of left ankle 12/31/2014  . Osteopenia 09/15/2014  . History of TIA (transient ischemic attack) 05/19/2014  .  Fracture of coronoid process of ulna, left, closed 04/25/2014  . Ingrown toenail 01/15/2014  . Obesity (BMI 30-39.9) 10/08/2013  . OA (osteoarthritis) of knee 07/07/2013  . Chronic low back pain 05/09/2013  . Obstructive sleep apnea 05/09/2013  . GERD (gastroesophageal reflux disease) 05/09/2013  .  Hiatal hernia 05/09/2013  . Fibromyalgia syndrome 05/09/2013  . Depression, major, in partial remission ([place]) 05/09/2013  . RLS (restless legs syndrome) 05/09/2013  . Diabetes mellitus with no complication ([place]) 43/83/8184  . High cholesterol 05/09/2013  . 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 Family history of colon cancer 05/09/2013  . Allergic rhinitis 05/09/2013  . Lateral meniscal tear 09/04/2012  . CAD (coronary artery disease) 09/27/2011  . HTN (hypertension) 09/27/2011  . Paroxysmal A-fib ([place]) 09/27/2011   Janna Arch, PT, DPT   10/23/2018, 8:59 AM  [place] MAIN [facility] SERVICES [address] [place], [place], [address] Phone: [phone]   Fax:  [phone]  Name: Morgan [name] MRN: [id number] Date of Birth: 1940-12-25

## 2018-10-28 ENCOUNTER — Ambulatory Visit: Payer: Medicare Other

## 2018-10-28 DIAGNOSIS — M6281 Muscle weakness (generalized): Secondary | ICD-10-CM

## 2018-10-28 DIAGNOSIS — M25511 Pain in right shoulder: Secondary | ICD-10-CM

## 2018-10-28 DIAGNOSIS — M25611 Stiffness of right shoulder, not elsewhere classified: Secondary | ICD-10-CM

## 2018-10-28 NOTE — Therapy (Signed)
[place] MAIN [facility] SERVICES [address] [place], [place], [address] Phone: [phone]   Fax:  [phone]  Physical Therapy Treatment  Patient Details  Name: Morgan [name] MRN: [id number] Date of Birth: May 08, 1941 Referring Provider (PT): Dr. Mayer Camel   Encounter Date: 10/28/2018  PT End of Session - 10/28/18 1003    Visit Number  7    Number of Visits  17    Date for PT Re-Evaluation  11/25/18    Authorization Type  Progress note: 7/10, Last goals: 09/30/18 (eval)    PT Start Time  0955    PT Stop Time  1030    PT Time Calculation (min)  35 min    Activity Tolerance  Patient limited by pain    Behavior During Therapy  [facility] for tasks assessed/performed       Past Medical History:  Diagnosis Date  . Angina   . Arthritis   . Atrial fibrillation (HCC)    ASPIRIN FOR BLOOD THINNER  . Atypical mole 09/22/2016  . Bulging discs    lumbar   . Cancer ([place])    melanoma  . Complication of anesthesia    pt states has choking sensation with ET tube   . Coronary artery disease   . Depression   . Diabetes mellitus    diet controlled/on meds  . Fibromyalgia   . GERD (gastroesophageal reflux disease)   . H/O hiatal hernia   . Headache(784.0)    "recurring"  . High cholesterol   . Hyperlipidemia   . Hypertension   . 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 Migraines    "til ~ 1980"  . PONV (postoperative nausea and vomiting)   . Restless leg syndrome   . Sciatic nerve pain    "from pinched nerve"  . Sleep apnea    uses CPAP  . Weakness of right side of body    "I've had PT for it; they don't know what it's from".  CORTISONE INJECTION INTO BACK 08/30/12    Past Surgical History:  Procedure Laterality Date  . [place] STUDY N/A 12/29/2015   Procedure: [place] STUDY;  Surgeon: Manus Gunning, MD;  [facility];  Service: Gastroenterology;  Laterality: N/A;  . CARPAL TUNNEL RELEASE Left 07/02/2015   Procedure: CARPAL  TUNNEL RELEASE;  Surgeon: Frederik Pear, MD;  Location: [place];  Service: Orthopedics;  Laterality: Left;  . CATARACT EXTRACTION, BILATERAL Bilateral    Oct and Nov 2017  . CORONARY ANGIOPLASTY WITH STENT PLACEMENT  06/2011   "1"  . CORONARY ARTERY BYPASS GRAFT  2005   CABG X 2  . DILATION AND CURETTAGE OF UTERUS     "more than once"  . ELBOW ARTHROSCOPY Left 07/02/2015   Procedure: ARTHROSCOPY LEFT ELBOW WITH DEBRIDEMENT AND REMOVAL LOOSE BODY;  Surgeon: Frederik Pear, MD;  Location: [place];  Service: Orthopedics;  Laterality: Left;  . ESOPHAGEAL MANOMETRY N/A 12/29/2015   Procedure: ESOPHAGEAL MANOMETRY (EM);  Surgeon: Manus Gunning, MD;  [facility];  Service: Gastroenterology;  Laterality: N/A;  . FRACTURE SURGERY  ~ 2005   nose  . KNEE ARTHROSCOPY  09/04/2012   Procedure: ARTHROSCOPY KNEE;  Surgeon: Gearlean Alf, MD;  [facility];  Service: Orthopedics;  Laterality: Right;  right knee arthroscopy with medial and lateral meniscus debridement  . 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 MOUTH SURGERY  2004   "  bone replacement; had cadavear bones put in; face was collapsing"  . NASAL SEPTUM SURGERY  ~ 1986  . TOTAL KNEE ARTHROPLASTY Right 07/07/2013   Procedure: RIGHT TOTAL KNEE ARTHROPLASTY;  Surgeon: Gearlean Alf, MD;  [facility];  Service: Orthopedics;  Laterality: Right;    There were no vitals filed for this visit.  Subjective Assessment - 10/28/18 1002    Subjective  Pt reports she sees a little improvement as therapy has progressed. She reports 3/10 resting R shoulder pain upon arrival today. No specific questions or concerns.     Pertinent History  On 08/03/18 pt was carrying a folding table and fell and right arm was under the handle got stuck between the door and the table. She landed on her R side. Tried to get up and had to pull at her arm. Pt had immediate pain in her right shoulder. She  waiteduntil Monday and went to see Dr. Edilia Bo her PCP. He took radiographs but didn't find any fractures. She continued to have pain so she called Dr. Damita Dunnings office, her orthopedist, and saw him that same week. He took additional radiographs and found a hairline fracture in her humeral shaft and a greater tuberosity fracture per patient report. She states that she is not healing well and has recurrent plain film radiographs. She is still wearing a sling and was encouraged not to perform any AROM of her right shoulder. Since her last visit with Dr. Mayer Camel on 09/20/18 she has had an additional fall and landed on her R shoulder. She reports additional bruising and pain in her R shoulder. She has a follow-up appt scheduled with her orthopedist for 10/15/18. 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 ROS negative for red flags.    Currently in Pain?  Yes    Pain Score  3     Pain Location  Shoulder    Pain Orientation  Right    Pain Descriptors / Indicators  Sharp    Pain Type  Chronic pain    Pain Onset  1 to 4 weeks ago        TREATMENT     Manual Therapy     TREATMENT   Ther-ex Moist heat pack applied to R shoulder at start of session during history x 5 minutes; Supine AAROM canes for flexion (elbows straight) and ER, pt continues to struggle to relax R shoulder Supine AROM R shoulder flexion within pain-free range x 10; L sidelying R shoulder AROM ER with 3s hold x 10; Supine cervical retractions 5s hold x 10, verbal as well as tactile cues required; Seated scapular retractions 3s hold x 10;   Manual Therapy R shoulder PROM flexion, scaption, IR, and ER with end range holds. Pt able to gradually allow for more PROM with cues to relax and rhythmic perturbations of R shoulder;   Iontophoresis Iontostat patch applied to R shoulder at end of session with dexamethasone 4mg /mL. Pt instructed to wear for 6 hours and then remove. Reinforced prior  education provided.    Pt educated throughout session about proper posture and technique.Improved technique, movement at target joints, after min to mod verbal, visual, tactile cues.    Pt arrived late so session is shortened accordingly. Shecontinues tostruggle significantly to relax R shoulder during PROM and AAROM canes but improves slightly with repetition. Initiated AROM of R shoulder today as permitted by protocol and within pain-free range. Pt encouraged to introduce into her HEP.  Will need to update outcome measures/goals at next session for a progress note. Encouraged pt to follow-up as scheduled.Pt will benefit from PT services to address deficits in strength,mobility, and painin order to return to full function at home.                  PT Short Term Goals - 09/30/18 1021      PT SHORT TERM GOAL #1   Title  Pt will be independent with HEP in order to improve strength and decrease pain in order to improve pain-free function at home.    Time  4    Period  Weeks    Status  New    Target Date  10/28/18        PT Long Term Goals - 09/30/18 1027      PT LONG TERM GOAL #1   Title  Pt will decrease quick DASH score by at least 8% in order to demonstrate clinically significant reduction in disability.    Baseline  09/30/18: 84.1%    Time  8    Period  Weeks    Status  New    Target Date  11/25/18      PT LONG TERM GOAL #2   Title  Pt will decrease worst pain as reported on NPRS by at least 3 points in order to demonstrate clinically significant reduction in pain.    Baseline  09/30/18: worst: 9/10    Time  8    Period  Weeks    Status  New    Target Date  11/25/18      PT LONG TERM GOAL #3   Title  Pt will improve PROM of R shoulder to within 10 degrees of R shoulder in order to improve functional use of R shoulder for ADLs/IADLs.    Baseline  09/30/18: R/L 129/150 Shoulder flexion, 100/165 Shoulder abduction, 50/93 Shoulder external rotation,  50/70 Shoulder internal rotation    Time  8    Period  Weeks    Status  New    Target Date  11/25/18            Plan - 10/28/18 1004    Clinical Impression Statement  Pt arrived late so session is shortened accordingly. Shecontinues tostruggle significantly to relax R shoulder during PROM and AAROM canes but improves slightly with repetition. Initiated AROM of R shoulder today as permitted by protocol and within pain-free range. Pt encouraged to introduce into her HEP. Will need to update outcome measures/goals at next session for a progress note. Encouraged pt to follow-up as scheduled.Pt will benefit from PT services to address deficits in strength,mobility, and painin order to return to full function at home.    Rehab Potential  Good    PT Frequency  2x / week    PT Duration  8 weeks    PT Treatment/Interventions  ADLs/Self Care Home Management;Aquatic Therapy;Canalith Repostioning;Cryotherapy;Electrical Stimulation;Iontophoresis 4mg /ml Dexamethasone;Moist Heat;Traction;Ultrasound;Contrast Bath;DME Instruction;Gait training;Stair training;Functional mobility training;Therapeutic activities;Therapeutic exercise;Balance training;Neuromuscular re-education;Patient/family education;Manual techniques;Passive range of motion;Dry needling;Splinting;Taping;Vestibular    PT Next Visit Plan  Outcome measures/update goals, Review HEP and progress, Progress PROM/AAROM/AROM of shoulder    PT Home Exercise Plan  Cervical retractions, Scapular retractions, R shoulder hangs of side of table, R upper trap stretch, supine canes for AAROM flexion with hands together    Consulted and Agree with Plan of Care  Patient       Patient will benefit from skilled therapeutic intervention in order to  improve the following deficits and impairments:  Decreased strength, Decreased range of motion, Impaired UE functional use, Pain  Visit Diagnosis: Acute pain of right shoulder  Stiffness of right shoulder, not  elsewhere classified  Muscle weakness (generalized)     Problem List Patient Active Problem List   Diagnosis Date Noted  . Malignant melanoma ([place]) 11/23/2016  . Urge incontinence 11/23/2016  . Atypical mole 09/22/2016  . Slurring of speech 05/12/2016  . Traumatic arthritis elbow 05/04/2015  . Left foot pain 12/31/2014  . Severe sprain of left ankle 12/31/2014  . Osteopenia 09/15/2014  . History of TIA (transient ischemic attack) 05/19/2014  . Fracture of coronoid process of ulna, left, closed 04/25/2014  . Ingrown toenail 01/15/2014  . Obesity (BMI 30-39.9) 10/08/2013  . OA (osteoarthritis) of knee 07/07/2013  . Chronic low back pain 05/09/2013  . Obstructive sleep apnea 05/09/2013  . GERD (gastroesophageal reflux disease) 05/09/2013  . Hiatal hernia 05/09/2013  . Fibromyalgia syndrome 05/09/2013  . Depression, major, in partial remission ([place]) 05/09/2013  . RLS (restless legs syndrome) 05/09/2013  . Diabetes mellitus with no complication ([place]) 38/45/3646  . High cholesterol 05/09/2013  . 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 Family history of colon cancer 05/09/2013  . Allergic rhinitis 05/09/2013  . Lateral meniscal tear 09/04/2012  . CAD (coronary artery disease) 09/27/2011  . HTN (hypertension) 09/27/2011  . Paroxysmal A-fib ([place]) 09/27/2011   Phillips Grout PT, DPT, GCS  Zarria Towell 10/29/2018, 12:42 PM  [place] MAIN [facility] SERVICES [address] [place], [place], [address] Phone: [phone]   Fax:  [phone]  Name: Wealthy Danielski MRN: [id number] Date of Birth: 1941/11/09

## 2018-10-30 ENCOUNTER — Ambulatory Visit: Payer: Medicare Other

## 2018-10-30 DIAGNOSIS — M6281 Muscle weakness (generalized): Secondary | ICD-10-CM

## 2018-10-30 DIAGNOSIS — M25511 Pain in right shoulder: Secondary | ICD-10-CM | POA: Diagnosis not present

## 2018-10-30 DIAGNOSIS — M25611 Stiffness of right shoulder, not elsewhere classified: Secondary | ICD-10-CM

## 2018-10-30 NOTE — Therapy (Signed)
[place] MAIN [facility] SERVICES [address] [place], [place], [address] Phone: [phone]   Fax:  [phone]  Physical Therapy Treatment/Progress Note  Dates of reporting period  09/30/18   to   10/30/18  Patient Details  Name: Morgan [name] MRN: [id number] Date of Birth: Sep 11, 1941 Referring Provider (PT): Dr. Mayer Camel   Encounter Date: 10/30/2018  PT End of Session - 10/30/18 1000    Visit Number  8    Number of Visits  17    Date for PT Re-Evaluation  11/25/18    Authorization Type  Progress note: 8/10 (next visit is 1/10), Last goals: 09/30/18 (eval), goals updated today 10/30/18    PT Start Time  0955    PT Stop Time  1030    PT Time Calculation (min)  35 min    Activity Tolerance  Patient limited by pain    Behavior During Therapy  [facility] for tasks assessed/performed       Past Medical History:  Diagnosis Date  . Angina   . Arthritis   . Atrial fibrillation (HCC)    ASPIRIN FOR BLOOD THINNER  . Atypical mole 09/22/2016  . Bulging discs    lumbar   . Cancer ([place])    melanoma  . Complication of anesthesia    pt states has choking sensation with ET tube   . Coronary artery disease   . Depression   . Diabetes mellitus    diet controlled/on meds  . Fibromyalgia   . GERD (gastroesophageal reflux disease)   . H/O hiatal hernia   . Headache(784.0)    "recurring"  . High cholesterol   . Hyperlipidemia   . Hypertension   . 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 Migraines    "til ~ 1980"  . PONV (postoperative nausea and vomiting)   . Restless leg syndrome   . Sciatic nerve pain    "from pinched nerve"  . Sleep apnea    uses CPAP  . Weakness of right side of body    "I've had PT for it; they don't know what it's from".  CORTISONE INJECTION INTO BACK 08/30/12    Past Surgical History:  Procedure Laterality Date  . [place] STUDY N/A 12/29/2015   Procedure: [place] STUDY;  Surgeon: Manus Gunning, MD;  [facility];  Service: Gastroenterology;  Laterality: N/A;  . CARPAL TUNNEL RELEASE Left 07/02/2015   Procedure: CARPAL TUNNEL RELEASE;  Surgeon: Frederik Pear, MD;  Location: [place];  Service: Orthopedics;  Laterality: Left;  . CATARACT EXTRACTION, BILATERAL Bilateral    Oct and Nov 2017  . CORONARY ANGIOPLASTY WITH STENT PLACEMENT  06/2011   "1"  . CORONARY ARTERY BYPASS GRAFT  2005   CABG X 2  . DILATION AND CURETTAGE OF UTERUS     "more than once"  . ELBOW ARTHROSCOPY Left 07/02/2015   Procedure: ARTHROSCOPY LEFT ELBOW WITH DEBRIDEMENT AND REMOVAL LOOSE BODY;  Surgeon: Frederik Pear, MD;  Location: [place];  Service: Orthopedics;  Laterality: Left;  . ESOPHAGEAL MANOMETRY N/A 12/29/2015   Procedure: ESOPHAGEAL MANOMETRY (EM);  Surgeon: Manus Gunning, MD;  [facility];  Service: Gastroenterology;  Laterality: N/A;  . FRACTURE SURGERY  ~ 2005   nose  . KNEE ARTHROSCOPY  09/04/2012   Procedure: ARTHROSCOPY KNEE;  Surgeon: Gearlean Alf, MD;  [facility];  Service: Orthopedics;  Laterality: Right;  right knee arthroscopy with medial and lateral  meniscus debridement  . 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 MOUTH SURGERY  2004   "bone replacement; had cadavear bones put in; face was collapsing"  . NASAL SEPTUM SURGERY  ~ 1986  . TOTAL KNEE ARTHROPLASTY Right 07/07/2013   Procedure: RIGHT TOTAL KNEE ARTHROPLASTY;  Surgeon: Gearlean Alf, MD;  [facility];  Service: Orthopedics;  Laterality: Right;    There were no vitals filed for this visit.  Subjective Assessment - 10/30/18 0959    Subjective  Pt reports she is doing well today and denies any resting pain upon arrival. However she does have some pain with AROM of R shoulder. HEP is going well. No specific questions or concerns.     Pertinent History  On 08/03/18 pt was carrying a folding table and fell and right arm was under the handle got stuck between  the door and the table. She landed on her R side. Tried to get up and had to pull at her arm. Pt had immediate pain in her right shoulder. She waiteduntil Monday and went to see Dr. Edilia Bo her PCP. He took radiographs but didn't find any fractures. She continued to have pain so she called Dr. Damita Dunnings office, her orthopedist, and saw him that same week. He took additional radiographs and found a hairline fracture in her humeral shaft and a greater tuberosity fracture per patient report. She states that she is not healing well and has recurrent plain film radiographs. She is still wearing a sling and was encouraged not to perform any AROM of her right shoulder. Since her last visit with Dr. Mayer Camel on 09/20/18 she has had an additional fall and landed on her R shoulder. She reports additional bruising and pain in her R shoulder. She has a follow-up appt scheduled with her orthopedist for 10/15/18. 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 ROS negative for red flags.    Currently in Pain?  No/denies             TREATMENT   Ther-ex Moist heat pack applied to R shoulder at start of session during historyx5 minutes, 3 minutes unbilled; Supine AAROM canes for flexion (elbows straight) and ER x 10 each, pt continues to struggle to relax R shoulderbut improved from prior session; Supine AROM R shoulder flexion through available range x 10; L sidelying R shoulder AROM abduction through available range x 10; L sidelying R shoulder AROM ER with 3s hold x 10; Supine cervical retractions 5s hold x 10, verbal as well as tactile cues required;   Manual Therapy R shoulder PROM flexion, scaption, IR, and ER with end range holds. Pt able to gradually allow for more PROM with cues to relax and rhythmic perturbations of R shoulder; Measurements obtained for R shoulder PROM:  R/L 140/150 Shoulder flexion, 132/165 Shoulder abduction, 82/93 Shoulder external rotation, 70/70  Shoulder internal rotation Pt completed QuickDASH (unbilled)=56.8%   Iontophoresis Iontostat patch applied to R shoulder at end of session with dexamethasone 68m/mL. Pt instructed to wear for 6 hours and then remove. Reinforced prior education provided.    Pt educated throughout session about proper posture and technique.Improved technique, movement at target joints, after min to mod verbal, visual, tactile cues.    Pt arrived late so session is shortened accordingly. Shedemonstrates improved active and passive motion of R shoulder with less guarding today compared to previous sessions. Her worst reported pain has decreased from a 9/10  at initial evaluation to 6/10 today. Her QuickDASH decreased from 84.1% at initial evaluation to 56.8% today.Encouraged pt to follow-up as scheduled and continue HEP.Pt will benefit from PT services to address deficits in strength,mobility, and painin order to return to full function at home.                       PT Short Term Goals - 10/30/18 1004      PT SHORT TERM GOAL #1   Title  Pt will be independent with HEP in order to improve strength and decrease pain in order to improve pain-free function at home.    Time  4    Period  Weeks    Status  Achieved        PT Long Term Goals - 10/30/18 1004      PT LONG TERM GOAL #1   Title  Pt will decrease quick DASH score by at least 8% in order to demonstrate clinically significant reduction in disability.    Baseline  09/30/18: 84.1%; 10/30/18: 56.8%    Time  8    Period  Weeks    Status  Achieved      PT LONG TERM GOAL #2   Title  Pt will decrease worst pain as reported on NPRS by at least 3 points in order to demonstrate clinically significant reduction in pain.    Baseline  09/30/18: worst: 9/10; 10/30/18: 6/10;    Time  8    Period  Weeks    Status  Achieved      PT LONG TERM GOAL #3   Title  Pt will improve PROM of R shoulder to within 10 degrees of R shoulder  in order to improve functional use of R shoulder for ADLs/IADLs.    Baseline  R/L 129/150 Shoulder flexion, 100/165 Shoulder abduction, 50/93 Shoulder external rotation, 50/70 Shoulder internal rotation; 10/30/18: R/L 140/150 Shoulder flexion, 132/165 Shoulder abduction, 82/93 Shoulder external rotation, 70/70 Shoulder internal rotation    Time  8    Period  Weeks    Status  Partially Met    Target Date  11/25/18      PT LONG TERM GOAL #4   Title  Pt will decrease worst pain as reported on NPRS by at least 3 points (3 or less) in order to demonstrate clinically significant reduction in pain.    Baseline  09/30/18: worst: 9/10; 10/30/18: 6/10;    Time  8    Period  Weeks    Status  New    Target Date  11/25/17            Plan - 10/30/18 1001    Clinical Impression Statement  Pt arrived late so session is shortened accordingly. Shedemonstrates improved active and passive motion of R shoulder with less guarding today compared to previous sessions. Her worst reported pain has decreased from a 9/10 at initial evaluation to 6/10 today. Her QuickDASH decreased from 84.1% at initial evaluation to 56.8% today.Encouraged pt to follow-up as scheduled and continue HEP.Pt will benefit from PT services to address deficits in strength,mobility, and painin order to return to full function at home.    Rehab Potential  Good    PT Frequency  2x / week    PT Duration  8 weeks    PT Treatment/Interventions  ADLs/Self Care Home Management;Aquatic Therapy;Canalith Repostioning;Cryotherapy;Electrical Stimulation;Iontophoresis 4mg/ml Dexamethasone;Moist Heat;Traction;Ultrasound;Contrast Bath;DME Instruction;Gait training;Stair training;Functional mobility training;Therapeutic activities;Therapeutic exercise;Balance training;Neuromuscular re-education;Patient/family education;Manual techniques;Passive range of motion;Dry   needling;Splinting;Taping;Vestibular    PT Next Visit Plan  Review HEP and progress as  needed, Progress PROM/AAROM/AROM of shoulder    PT Home Exercise Plan  Cervical retractions, Scapular retractions, R shoulder hangs of side of table, R upper trap stretch, supine canes for AAROM flexion with hands together    Consulted and Agree with Plan of Care  Patient       Patient will benefit from skilled therapeutic intervention in order to improve the following deficits and impairments:  Decreased strength, Decreased range of motion, Impaired UE functional use, Pain  Visit Diagnosis: Acute pain of right shoulder  Stiffness of right shoulder, not elsewhere classified  Muscle weakness (generalized)     Problem List Patient Active Problem List   Diagnosis Date Noted  . Malignant melanoma ([place]) 11/23/2016  . Urge incontinence 11/23/2016  . Atypical mole 09/22/2016  . Slurring of speech 05/12/2016  . Traumatic arthritis elbow 05/04/2015  . Left foot pain 12/31/2014  . Severe sprain of left ankle 12/31/2014  . Osteopenia 09/15/2014  . History of TIA (transient ischemic attack) 05/19/2014  . Fracture of coronoid process of ulna, left, closed 04/25/2014  . Ingrown toenail 01/15/2014  . Obesity (BMI 30-39.9) 10/08/2013  . OA (osteoarthritis) of knee 07/07/2013  . Chronic low back pain 05/09/2013  . Obstructive sleep apnea 05/09/2013  . GERD (gastroesophageal reflux disease) 05/09/2013  . Hiatal hernia 05/09/2013  . Fibromyalgia syndrome 05/09/2013  . Depression, major, in partial remission ([place]) 05/09/2013  . RLS (restless legs syndrome) 05/09/2013  . Diabetes mellitus with no complication ([place]) 05/69/7948  . High cholesterol 05/09/2013  . 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 Family history of colon cancer 05/09/2013  . Allergic rhinitis 05/09/2013  . Lateral meniscal tear 09/04/2012  . CAD (coronary artery disease) 09/27/2011  . HTN (hypertension) 09/27/2011  . Paroxysmal A-fib ([place]) 09/27/2011   Phillips Grout PT, DPT, GCS  , 10/30/2018, 2:58  PM  [place] MAIN [facility] SERVICES [address] [place], [place], [address] Phone: [phone]   Fax:  [phone]  Name: Zipporah Finamore MRN: [id number] Date of Birth: Oct 20, 1941

## 2018-10-31 ENCOUNTER — Other Ambulatory Visit: Payer: Self-pay | Admitting: Family Medicine

## 2018-10-31 NOTE — Telephone Encounter (Signed)
Last office visit 08/05/2018 for Pain of right humerus with Dr. Lorelei Pont.  Last refilled 09/27/2018 for #30 with no refills.  Next Appt: 12/24/2018 for CPE.

## 2018-11-07 ENCOUNTER — Ambulatory Visit: Payer: Medicare Other

## 2018-11-09 ENCOUNTER — Other Ambulatory Visit: Payer: Self-pay | Admitting: Family Medicine

## 2018-11-11 ENCOUNTER — Ambulatory Visit: Payer: Medicare Other

## 2018-11-11 DIAGNOSIS — M25511 Pain in right shoulder: Secondary | ICD-10-CM

## 2018-11-11 DIAGNOSIS — M25611 Stiffness of right shoulder, not elsewhere classified: Secondary | ICD-10-CM

## 2018-11-11 DIAGNOSIS — M6281 Muscle weakness (generalized): Secondary | ICD-10-CM

## 2018-11-11 NOTE — Telephone Encounter (Signed)
Last office visit 08/05/2018 with Dr. Lorelei Pont for Right Humerus Pain.  Last refilled 08/20/2018 for #60 with 1 refill.  Next Appt: 12/24/2018 for CPE.

## 2018-11-11 NOTE — Therapy (Signed)
[place] MAIN [facility] SERVICES [address] [place], [place], [address] Phone: [phone]   Fax:  [phone]  Physical Therapy Treatment  Patient Details  Name: Morgan [name] MRN: [id number] Date of Birth: May 19, 1941 Referring Provider (PT): Dr. Mayer Camel   Encounter Date: 11/11/2018  PT End of Session - 11/11/18 1004    Visit Number  9    Number of Visits  17    Date for PT Re-Evaluation  11/25/18    Authorization Type  1/10, Last goals: 10/30/18, 09/30/18 (eval)    PT Start Time  1000    PT Stop Time  1030    PT Time Calculation (min)  30 min    Activity Tolerance  Patient limited by pain    Behavior During Therapy  [facility] for tasks assessed/performed       Past Medical History:  Diagnosis Date  . Angina   . Arthritis   . Atrial fibrillation (HCC)    ASPIRIN FOR BLOOD THINNER  . Atypical mole 09/22/2016  . Bulging discs    lumbar   . Cancer ([place])    melanoma  . Complication of anesthesia    pt states has choking sensation with ET tube   . Coronary artery disease   . Depression   . Diabetes mellitus    diet controlled/on meds  . Fibromyalgia   . GERD (gastroesophageal reflux disease)   . H/O hiatal hernia   . Headache(784.0)    "recurring"  . High cholesterol   . Hyperlipidemia   . Hypertension   . 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 Migraines    "til ~ 1980"  . PONV (postoperative nausea and vomiting)   . Restless leg syndrome   . Sciatic nerve pain    "from pinched nerve"  . Sleep apnea    uses CPAP  . Weakness of right side of body    "I've had PT for it; they don't know what it's from".  CORTISONE INJECTION INTO BACK 08/30/12    Past Surgical History:  Procedure Laterality Date  . [place] STUDY N/A 12/29/2015   Procedure: [place] STUDY;  Surgeon: Manus Gunning, MD;  [facility];  Service: Gastroenterology;  Laterality: N/A;  . CARPAL TUNNEL RELEASE Left 07/02/2015   Procedure: CARPAL TUNNEL  RELEASE;  Surgeon: Frederik Pear, MD;  Location: [place];  Service: Orthopedics;  Laterality: Left;  . CATARACT EXTRACTION, BILATERAL Bilateral    Oct and Nov 2017  . CORONARY ANGIOPLASTY WITH STENT PLACEMENT  06/2011   "1"  . CORONARY ARTERY BYPASS GRAFT  2005   CABG X 2  . DILATION AND CURETTAGE OF UTERUS     "more than once"  . ELBOW ARTHROSCOPY Left 07/02/2015   Procedure: ARTHROSCOPY LEFT ELBOW WITH DEBRIDEMENT AND REMOVAL LOOSE BODY;  Surgeon: Frederik Pear, MD;  Location: [place];  Service: Orthopedics;  Laterality: Left;  . ESOPHAGEAL MANOMETRY N/A 12/29/2015   Procedure: ESOPHAGEAL MANOMETRY (EM);  Surgeon: Manus Gunning, MD;  [facility];  Service: Gastroenterology;  Laterality: N/A;  . FRACTURE SURGERY  ~ 2005   nose  . KNEE ARTHROSCOPY  09/04/2012   Procedure: ARTHROSCOPY KNEE;  Surgeon: Gearlean Alf, MD;  [facility];  Service: Orthopedics;  Laterality: Right;  right knee arthroscopy with medial and lateral meniscus debridement  . 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 MOUTH SURGERY  2004   "bone  replacement; had cadavear bones put in; face was collapsing"  . NASAL SEPTUM SURGERY  ~ 1986  . TOTAL KNEE ARTHROPLASTY Right 07/07/2013   Procedure: RIGHT TOTAL KNEE ARTHROPLASTY;  Surgeon: Gearlean Alf, MD;  [facility];  Service: Orthopedics;  Laterality: Right;    There were no vitals filed for this visit.  Subjective Assessment - 11/11/18 1002    Subjective  Pt reports she is doing well today and complaining of 3/10 resting pain. She has a follow-up appointment with orthopedist on 11/14/17. HEP is going well. No specific questions or concerns other than frustration regarding the speed of her progress.     Pertinent History  On 08/03/18 pt was carrying a folding table and fell and right arm was under the handle got stuck between the door and the table. She landed on her R side. Tried to get up and  had to pull at her arm. Pt had immediate pain in her right shoulder. She waiteduntil Monday and went to see Dr. Edilia Bo her PCP. He took radiographs but didn't find any fractures. She continued to have pain so she called Dr. Damita Dunnings office, her orthopedist, and saw him that same week. He took additional radiographs and found a hairline fracture in her humeral shaft and a greater tuberosity fracture per patient report. She states that she is not healing well and has recurrent plain film radiographs. She is still wearing a sling and was encouraged not to perform any AROM of her right shoulder. Since her last visit with Dr. Mayer Camel on 09/20/18 she has had an additional fall and landed on her R shoulder. She reports additional bruising and pain in her R shoulder. She has a follow-up appt scheduled with her orthopedist for 10/15/18. 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 ROS negative for red flags.    Currently in Pain?  Yes    Pain Score  3     Pain Location  Shoulder    Pain Orientation  Right    Pain Descriptors / Indicators  Aching    Pain Type  Chronic pain    Pain Onset  1 to 4 weeks ago    Pain Frequency  Constant          TREATMENT   Ther-ex Supine AROM R shoulder flexion through available rangex 10; L sidelying R shoulder AROM abduction through available range x 10; L sidelying R shoulder AROM ER with 3s hold x 10; Prone R shoulder extension AROM with 3s hold x 10; Prone R shoulder horizontal abduction AROM with 3s hold x 10; Seated scapular retractions 3s hold x 10; Standing finger ladder wall walks for flexion and abduction x 10 each;    Iontophoresis Iontostat patch applied to R shoulder at end of session with dexamethasone 63m/mL. Pt instructed to wear for 6 hours and then remove. Reinforced prior education provided.   Pt educated throughout session about proper posture and technique.Improved technique, movement at target joints, after  min to mod verbal, visual, tactile cues.    Ptarrived late so session is shortened accordingly. Shedemonstrates improved active and passive motion of R shoulder with less guarding since the start of therapy. Her worst reported pain has decreased from a 9/10 at initial evaluation to 6/10 and her QuickDASH decreased from 84.1% at initial evaluation to 56.8% at last visit.Encouraged pt to follow-up as scheduled and continue HEP.Pt will benefit from PT services to address deficits in strength,mobility, and painin order  to return to full function at home.                        PT Short Term Goals - 10/30/18 1004      PT SHORT TERM GOAL #1   Title  Pt will be independent with HEP in order to improve strength and decrease pain in order to improve pain-free function at home.    Time  4    Period  Weeks    Status  Achieved        PT Long Term Goals - 10/30/18 1004      PT LONG TERM GOAL #1   Title  Pt will decrease quick DASH score by at least 8% in order to demonstrate clinically significant reduction in disability.    Baseline  09/30/18: 84.1%; 10/30/18: 56.8%    Time  8    Period  Weeks    Status  Achieved      PT LONG TERM GOAL #2   Title  Pt will decrease worst pain as reported on NPRS by at least 3 points in order to demonstrate clinically significant reduction in pain.    Baseline  09/30/18: worst: 9/10; 10/30/18: 6/10;    Time  8    Period  Weeks    Status  Achieved      PT LONG TERM GOAL #3   Title  Pt will improve PROM of R shoulder to within 10 degrees of R shoulder in order to improve functional use of R shoulder for ADLs/IADLs.    Baseline  R/L 129/150 Shoulder flexion, 100/165 Shoulder abduction, 50/93 Shoulder external rotation, 50/70 Shoulder internal rotation; 10/30/18: R/L 140/150 Shoulder flexion, 132/165 Shoulder abduction, 82/93 Shoulder external rotation, 70/70 Shoulder internal rotation    Time  8    Period  Weeks    Status   Partially Met    Target Date  11/25/18      PT LONG TERM GOAL #4   Title  Pt will decrease worst pain as reported on NPRS by at least 3 points (3 or less) in order to demonstrate clinically significant reduction in pain.    Baseline  09/30/18: worst: 9/10; 10/30/18: 6/10;    Time  8    Period  Weeks    Status  New    Target Date  11/25/17            Plan - 11/11/18 1006    Clinical Impression Statement  Ptarrived late so session is shortened accordingly. Shedemonstrates improved active and passive motion of R shoulder with less guarding since the start of therapy. Her worst reported pain has decreased from a 9/10 at initial evaluation to 6/10 and her QuickDASH decreased from 84.1% at initial evaluation to 56.8% at last visit.Encouraged pt to follow-up as scheduled and continue HEP.Pt will benefit from PT services to address deficits in strength,mobility, and painin order to return to full function at home.    Rehab Potential  Good    PT Frequency  2x / week    PT Duration  8 weeks    PT Treatment/Interventions  ADLs/Self Care Home Management;Aquatic Therapy;Canalith Repostioning;Cryotherapy;Electrical Stimulation;Iontophoresis 64m/ml Dexamethasone;Moist Heat;Traction;Ultrasound;Contrast Bath;DME Instruction;Gait training;Stair training;Functional mobility training;Therapeutic activities;Therapeutic exercise;Balance training;Neuromuscular re-education;Patient/family education;Manual techniques;Passive range of motion;Dry needling;Splinting;Taping;Vestibular    PT Next Visit Plan  Review HEP and progress as needed, Progress PROM/AAROM/AROM of shoulder    PT Home Exercise Plan  Cervical retractions, Scapular retractions, R shoulder hangs of side  of table, R upper trap stretch, supine canes for AAROM flexion with hands together    Consulted and Agree with Plan of Care  Patient       Patient will benefit from skilled therapeutic intervention in order to improve the following deficits  and impairments:  Decreased strength, Decreased range of motion, Impaired UE functional use, Pain  Visit Diagnosis: Acute pain of right shoulder  Stiffness of right shoulder, not elsewhere classified  Muscle weakness (generalized)     Problem List Patient Active Problem List   Diagnosis Date Noted  . Malignant melanoma ([place]) 11/23/2016  . Urge incontinence 11/23/2016  . Atypical mole 09/22/2016  . Slurring of speech 05/12/2016  . Traumatic arthritis elbow 05/04/2015  . Left foot pain 12/31/2014  . Severe sprain of left ankle 12/31/2014  . Osteopenia 09/15/2014  . History of TIA (transient ischemic attack) 05/19/2014  . Fracture of coronoid process of ulna, left, closed 04/25/2014  . Ingrown toenail 01/15/2014  . Obesity (BMI 30-39.9) 10/08/2013  . OA (osteoarthritis) of knee 07/07/2013  . Chronic low back pain 05/09/2013  . Obstructive sleep apnea 05/09/2013  . GERD (gastroesophageal reflux disease) 05/09/2013  . Hiatal hernia 05/09/2013  . Fibromyalgia syndrome 05/09/2013  . Depression, major, in partial remission ([place]) 05/09/2013  . RLS (restless legs syndrome) 05/09/2013  . Diabetes mellitus with no complication ([place]) 73/56/7014  . High cholesterol 05/09/2013  . 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 Family history of colon cancer 05/09/2013  . Allergic rhinitis 05/09/2013  . Lateral meniscal tear 09/04/2012  . CAD (coronary artery disease) 09/27/2011  . HTN (hypertension) 09/27/2011  . Paroxysmal A-fib ([place]) 09/27/2011   Phillips Grout PT, DPT, GCS  Kolbey Teichert 11/11/2018, 11:43 AM  [place] MAIN [facility] SERVICES [address] [place], [place], [address] Phone: [phone]   Fax:  [phone]  Name: Morgan [name] MRN: [id number] Date of Birth: 09-15-1941

## 2018-11-14 ENCOUNTER — Ambulatory Visit: Payer: Medicare Other | Attending: Orthopedic Surgery

## 2018-11-14 DIAGNOSIS — M25611 Stiffness of right shoulder, not elsewhere classified: Secondary | ICD-10-CM | POA: Insufficient documentation

## 2018-11-14 DIAGNOSIS — M6281 Muscle weakness (generalized): Secondary | ICD-10-CM | POA: Diagnosis present

## 2018-11-14 DIAGNOSIS — M25511 Pain in right shoulder: Secondary | ICD-10-CM | POA: Diagnosis not present

## 2018-11-14 NOTE — Patient Instructions (Signed)
Access Code: [email or web address]  URL: [email or web address]  Date: 11/14/2018  Prepared by: Roxana Hires   Exercises  Sidelying Shoulder External Rotation AROM - 10 reps - 1 sets - 3 seconds hold - 2x daily - 7x weekly  Sidelying Shoulder Abduction Palm Forward - 10 reps - 1 sets - 3 seconds hold - 2x daily - 7x weekly  Supine Shoulder Flexion Extension Full Range AROM - 10 reps - 3 sets - 2x daily - 7x weekly

## 2018-11-14 NOTE — Therapy (Signed)
[place] MAIN [facility] SERVICES [address] [place], [place], [address] Phone: [phone]   Fax:  [phone]  Physical Therapy Treatment  Patient Details  Name: Morgan [name] MRN: [id number] Date of Birth: [birth year] Referring Provider (PT): Dr. Mayer Camel   Encounter Date: 11/14/2018  PT End of Session - 11/14/18 1108    Visit Number  10    Number of Visits  17    Date for PT Re-Evaluation  11/25/18    Authorization Type  2/10, Last goals: 10/30/18, 09/30/18 (eval)    PT Start Time  0947    PT Stop Time  1030    PT Time Calculation (min)  43 min    Activity Tolerance  Patient limited by pain    Behavior During Therapy  [facility] for tasks assessed/performed       Past Medical History:  Diagnosis Date  . Angina   . Arthritis   . Atrial fibrillation (HCC)    ASPIRIN FOR BLOOD THINNER  . Atypical mole 09/22/2016  . Bulging discs    lumbar   . Cancer ([place])    melanoma  . Complication of anesthesia    pt states has choking sensation with ET tube   . Coronary artery disease   . Depression   . Diabetes mellitus    diet controlled/on meds  . Fibromyalgia   . GERD (gastroesophageal reflux disease)   . H/O hiatal hernia   . Headache(784.0)    "recurring"  . High cholesterol   . Hyperlipidemia   . Hypertension   . 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 Migraines    "til ~ 1980"  . PONV (postoperative nausea and vomiting)   . Restless leg syndrome   . Sciatic nerve pain    "from pinched nerve"  . Sleep apnea    uses CPAP  . Weakness of right side of body    "I've had PT for it; they don't know what it's from".  CORTISONE INJECTION INTO BACK 08/30/12    Past Surgical History:  Procedure Laterality Date  . [place] STUDY N/A 12/29/2015   Procedure: [place] STUDY;  Surgeon: Manus Gunning, MD;  [facility];  Service: Gastroenterology;  Laterality: N/A;  . CARPAL TUNNEL RELEASE Left 07/02/2015   Procedure: CARPAL TUNNEL  RELEASE;  Surgeon: Frederik Pear, MD;  Location: [place];  Service: Orthopedics;  Laterality: Left;  . CATARACT EXTRACTION, BILATERAL Bilateral    Oct and Nov 2017  . CORONARY ANGIOPLASTY WITH STENT PLACEMENT  06/2011   "1"  . CORONARY ARTERY BYPASS GRAFT  2005   CABG X 2  . DILATION AND CURETTAGE OF UTERUS     "more than once"  . ELBOW ARTHROSCOPY Left 07/02/2015   Procedure: ARTHROSCOPY LEFT ELBOW WITH DEBRIDEMENT AND REMOVAL LOOSE BODY;  Surgeon: Frederik Pear, MD;  Location: [place];  Service: Orthopedics;  Laterality: Left;  . ESOPHAGEAL MANOMETRY N/A 12/29/2015   Procedure: ESOPHAGEAL MANOMETRY (EM);  Surgeon: Manus Gunning, MD;  [facility];  Service: Gastroenterology;  Laterality: N/A;  . FRACTURE SURGERY  ~ 2005   nose  . KNEE ARTHROSCOPY  09/04/2012   Procedure: ARTHROSCOPY KNEE;  Surgeon: Gearlean Alf, MD;  [facility];  Service: Orthopedics;  Laterality: Right;  right knee arthroscopy with medial and lateral meniscus debridement  . 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 MOUTH SURGERY  2004   "bone  replacement; had cadavear bones put in; face was collapsing"  . NASAL SEPTUM SURGERY  ~ 1986  . TOTAL KNEE ARTHROPLASTY Right 07/07/2013   Procedure: RIGHT TOTAL KNEE ARTHROPLASTY;  Surgeon: Gearlean Alf, MD;  [facility];  Service: Orthopedics;  Laterality: Right;    There were no vitals filed for this visit.  Subjective Assessment - 11/14/18 1104    Subjective  Pt reports she is doing well today and complaining of 5/10 resting pain due to being very active at night. She has a follow-up appointment with orthopedist today. HEP is going well. No specific questions or concerns.    Pertinent History  On 08/03/18 pt was carrying a folding table and fell and right arm was under the handle got stuck between the door and the table. She landed on her R side. Tried to get up and had to pull at her arm. Pt had  immediate pain in her right shoulder. She waiteduntil Monday and went to see Dr. Edilia Bo her PCP. He took radiographs but didn't find any fractures. She continued to have pain so she called Dr. Damita Dunnings office, her orthopedist, and saw him that same week. He took additional radiographs and found a hairline fracture in her humeral shaft and a greater tuberosity fracture per patient report. She states that she is not healing well and has recurrent plain film radiographs. She is still wearing a sling and was encouraged not to perform any AROM of her right shoulder. Since her last visit with Dr. Mayer Camel on 09/20/18 she has had an additional fall and landed on her R shoulder. She reports additional bruising and pain in her R shoulder. She has a follow-up appt scheduled with her orthopedist for 10/15/18. 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 ROS negative for red flags.    Currently in Pain?  Yes    Pain Score  5     Pain Location  Shoulder    Pain Orientation  Right    Pain Descriptors / Indicators  Aching    Pain Type  Chronic pain    Pain Onset  1 to 4 weeks ago        TREATMENT   Ther-ex Supine AROM R shoulder flexionthrough available rangex 10; Supine R shoulder PROM flexion, abduction, ER, IR, horizontal abd/add with end range holds; L sidelying R shoulder AROM abduction through available range x 10; L sidelying R shoulder AROM ER with 3s hold x 10; Prone R shoulder extension AROM with 3s hold x 10; Prone R shoulder horizontal abduction AROM with 3s hold x 10; Prone R shoulder "Y's" with assist from therapist x 10; Standing R shoulder AROM flexion and scaption with mirror feedback as well as verbal/tactile cues to avoid shoulder hiking.  Seated scapular retractions 3s hold x 10, verbal and tactile cues to avoid upper trap hiking; Standing finger ladder wall walks for flexion and abduction x 10 each;  Reviewed HEP    Pt educated throughout session about  proper posture and technique.Improved technique, movement at target joints, after min to mod verbal, visual, tactile cues.    Shedemonstrates improved active and passive motion of R shoulder with less guarding since the start of therapy. She continues to have some guarding during AROM. Pt provided verbal and tactile feedback with AROM in standing to decreased R shoulder hiking during flexion and scaption. Encouraged pt to progress her AAROM to AROM at home in supine, sidelying, and standing.Encouraged pt to  follow-up as scheduledand continue HEP.Pt will benefit from PT services to address deficits in strength,mobility, and painin order to return to full function at home.                         PT Short Term Goals - 10/30/18 1004      PT SHORT TERM GOAL #1   Title  Pt will be independent with HEP in order to improve strength and decrease pain in order to improve pain-free function at home.    Time  4    Period  Weeks    Status  Achieved        PT Long Term Goals - 10/30/18 1004      PT LONG TERM GOAL #1   Title  Pt will decrease quick DASH score by at least 8% in order to demonstrate clinically significant reduction in disability.    Baseline  09/30/18: 84.1%; 10/30/18: 56.8%    Time  8    Period  Weeks    Status  Achieved      PT LONG TERM GOAL #2   Title  Pt will decrease worst pain as reported on NPRS by at least 3 points in order to demonstrate clinically significant reduction in pain.    Baseline  09/30/18: worst: 9/10; 10/30/18: 6/10;    Time  8    Period  Weeks    Status  Achieved      PT LONG TERM GOAL #3   Title  Pt will improve PROM of R shoulder to within 10 degrees of R shoulder in order to improve functional use of R shoulder for ADLs/IADLs.    Baseline  R/L 129/150 Shoulder flexion, 100/165 Shoulder abduction, 50/93 Shoulder external rotation, 50/70 Shoulder internal rotation; 10/30/18: R/L 140/150 Shoulder flexion, 132/165 Shoulder  abduction, 82/93 Shoulder external rotation, 70/70 Shoulder internal rotation    Time  8    Period  Weeks    Status  Partially Met    Target Date  11/25/18      PT LONG TERM GOAL #4   Title  Pt will decrease worst pain as reported on NPRS by at least 3 points (3 or less) in order to demonstrate clinically significant reduction in pain.    Baseline  09/30/18: worst: 9/10; 10/30/18: 6/10;    Time  8    Period  Weeks    Status  New    Target Date  11/25/17            Plan - 11/14/18 1027    Clinical Impression Statement  Shedemonstrates improved active and passive motion of R shoulder with less guarding since the start of therapy. She continues to have some guarding during AROM. Pt provided verbal and tactile feedback with AROM in standing to decreased R shoulder hiking during flexion and scaption. Encouraged pt to progress her AAROM to AROM at home in supine, sidelying, and standing.Encouraged pt to follow-up as scheduledand continue HEP.Pt will benefit from PT services to address deficits in strength,mobility, and painin order to return to full function at home.    Rehab Potential  Good    PT Frequency  2x / week    PT Duration  8 weeks    PT Treatment/Interventions  ADLs/Self Care Home Management;Aquatic Therapy;Canalith Repostioning;Cryotherapy;Electrical Stimulation;Iontophoresis '4mg'$ /ml Dexamethasone;Moist Heat;Traction;Ultrasound;Contrast Bath;DME Instruction;Gait training;Stair training;Functional mobility training;Therapeutic activities;Therapeutic exercise;Balance training;Neuromuscular re-education;Patient/family education;Manual techniques;Passive range of motion;Dry needling;Splinting;Taping;Vestibular    PT Next Visit Plan  Review HEP  and progress as needed, Progress PROM/AAROM/AROM of shoulder    PT Home Exercise Plan  Cervical retractions, Scapular retractions, R upper trap stretch, supine canes for AAROM flexion with hands together progressing to AROM supine flexion,  AROM sidelying shoulder abduction, AROM sidelying shoulder ER    Consulted and Agree with Plan of Care  Patient       Patient will benefit from skilled therapeutic intervention in order to improve the following deficits and impairments:  Decreased strength, Decreased range of motion, Impaired UE functional use, Pain  Visit Diagnosis: Acute pain of right shoulder  Stiffness of right shoulder, not elsewhere classified  Muscle weakness (generalized)     Problem List Patient Active Problem List   Diagnosis Date Noted  . Malignant melanoma ([place]) 11/23/2016  . Urge incontinence 11/23/2016  . Atypical mole 09/22/2016  . Slurring of speech 05/12/2016  . Traumatic arthritis elbow 05/04/2015  . Left foot pain 12/31/2014  . Severe sprain of left ankle 12/31/2014  . Osteopenia 09/15/2014  . History of TIA (transient ischemic attack) 05/19/2014  . Fracture of coronoid process of ulna, left, closed 04/25/2014  . Ingrown toenail 01/15/2014  . Obesity (BMI 30-39.9) 10/08/2013  . OA (osteoarthritis) of knee 07/07/2013  . Chronic low back pain 05/09/2013  . Obstructive sleep apnea 05/09/2013  . GERD (gastroesophageal reflux disease) 05/09/2013  . Hiatal hernia 05/09/2013  . Fibromyalgia syndrome 05/09/2013  . Depression, major, in partial remission ([place]) 05/09/2013  . RLS (restless legs syndrome) 05/09/2013  . Diabetes mellitus with no complication ([place]) 74/71/5953  . High cholesterol 05/09/2013  . 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 Family history of colon cancer 05/09/2013  . Allergic rhinitis 05/09/2013  . Lateral meniscal tear 09/04/2012  . CAD (coronary artery disease) 09/27/2011  . HTN (hypertension) 09/27/2011  . Paroxysmal A-fib ([place]) 09/27/2011   Phillips Grout PT, DPT, GCS  Huprich,Jason 11/14/2018, 11:11 AM  [place] MAIN [facility] SERVICES [address] [place], [place], [address] Phone: [phone]   Fax:  [phone]  Name:  Morgan [name] MRN: [id number] Date of Birth: [birth year]

## 2018-11-18 ENCOUNTER — Ambulatory Visit: Payer: Medicare Other

## 2018-11-18 DIAGNOSIS — M25611 Stiffness of right shoulder, not elsewhere classified: Secondary | ICD-10-CM

## 2018-11-18 DIAGNOSIS — M25511 Pain in right shoulder: Secondary | ICD-10-CM | POA: Diagnosis not present

## 2018-11-18 DIAGNOSIS — M6281 Muscle weakness (generalized): Secondary | ICD-10-CM

## 2018-11-18 NOTE — Therapy (Signed)
[place] MAIN [facility] SERVICES [address] [place], [place], [address] Phone: [phone]   Fax:  [phone]  Physical Therapy Treatment  Patient Details  Name: Morgan [name] MRN: [id number] Date of Birth: 12-08-1940 Referring Provider (PT): Dr. Mayer Camel   Encounter Date: 11/18/2018  PT End of Session - 11/18/18 1005    Visit Number  11    Number of Visits  17    Date for PT Re-Evaluation  11/25/18    Authorization Type  3/10, Last goals: 10/30/18, 09/30/18 (eval)    PT Start Time  0955    PT Stop Time  1035    PT Time Calculation (min)  40 min    Activity Tolerance  Patient limited by pain    Behavior During Therapy  [facility] for tasks assessed/performed       Past Medical History:  Diagnosis Date  . Angina   . Arthritis   . Atrial fibrillation (HCC)    ASPIRIN FOR BLOOD THINNER  . Atypical mole 09/22/2016  . Bulging discs    lumbar   . Cancer ([place])    melanoma  . Complication of anesthesia    pt states has choking sensation with ET tube   . Coronary artery disease   . Depression   . Diabetes mellitus    diet controlled/on meds  . Fibromyalgia   . GERD (gastroesophageal reflux disease)   . H/O hiatal hernia   . Headache(784.0)    "recurring"  . High cholesterol   . Hyperlipidemia   . Hypertension   . 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 Migraines    "til ~ 1980"  . PONV (postoperative nausea and vomiting)   . Restless leg syndrome   . Sciatic nerve pain    "from pinched nerve"  . Sleep apnea    uses CPAP  . Weakness of right side of body    "I've had PT for it; they don't know what it's from".  CORTISONE INJECTION INTO BACK 08/30/12    Past Surgical History:  Procedure Laterality Date  . [place] STUDY N/A 12/29/2015   Procedure: [place] STUDY;  Surgeon: Manus Gunning, MD;  [facility];  Service: Gastroenterology;  Laterality: N/A;  . CARPAL TUNNEL RELEASE Left 07/02/2015   Procedure: CARPAL TUNNEL  RELEASE;  Surgeon: Frederik Pear, MD;  Location: [place];  Service: Orthopedics;  Laterality: Left;  . CATARACT EXTRACTION, BILATERAL Bilateral    Oct and Nov 2017  . CORONARY ANGIOPLASTY WITH STENT PLACEMENT  06/2011   "1"  . CORONARY ARTERY BYPASS GRAFT  2005   CABG X 2  . DILATION AND CURETTAGE OF UTERUS     "more than once"  . ELBOW ARTHROSCOPY Left 07/02/2015   Procedure: ARTHROSCOPY LEFT ELBOW WITH DEBRIDEMENT AND REMOVAL LOOSE BODY;  Surgeon: Frederik Pear, MD;  Location: [place];  Service: Orthopedics;  Laterality: Left;  . ESOPHAGEAL MANOMETRY N/A 12/29/2015   Procedure: ESOPHAGEAL MANOMETRY (EM);  Surgeon: Manus Gunning, MD;  [facility];  Service: Gastroenterology;  Laterality: N/A;  . FRACTURE SURGERY  ~ 2005   nose  . KNEE ARTHROSCOPY  09/04/2012   Procedure: ARTHROSCOPY KNEE;  Surgeon: Gearlean Alf, MD;  [facility];  Service: Orthopedics;  Laterality: Right;  right knee arthroscopy with medial and lateral meniscus debridement  . 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 MOUTH SURGERY  2004   "bone  replacement; had cadavear bones put in; face was collapsing"  . NASAL SEPTUM SURGERY  ~ 1986  . TOTAL KNEE ARTHROPLASTY Right 07/07/2013   Procedure: RIGHT TOTAL KNEE ARTHROPLASTY;  Surgeon: Gearlean Alf, MD;  [facility];  Service: Orthopedics;  Laterality: Right;    There were no vitals filed for this visit.  Subjective Assessment - 11/18/18 0958    Subjective  Pt reports she is doing well today. Denies resting pain upon arrival. She saw her orthopedist last Thursday who ordered an MRI and states that it is highly likely she will need surgery. Pt had the MRI performed Friday and called her orthopedist this morning who scheduled her to return on Thursday. HEP is going well. No specific questions or concerns. No specific instruction provided by orthopedist with respect to physical therapy.      Pertinent History  On 08/03/18 pt was carrying a folding table and fell and right arm was under the handle got stuck between the door and the table. She landed on her R side. Tried to get up and had to pull at her arm. Pt had immediate pain in her right shoulder. She waiteduntil Monday and went to see Dr. Edilia Bo her PCP. He took radiographs but didn't find any fractures. She continued to have pain so she called Dr. Damita Dunnings office, her orthopedist, and saw him that same week. He took additional radiographs and found a hairline fracture in her humeral shaft and a greater tuberosity fracture per patient report. She states that she is not healing well and has recurrent plain film radiographs. She is still wearing a sling and was encouraged not to perform any AROM of her right shoulder. Since her last visit with Dr. Mayer Camel on 09/20/18 she has had an additional fall and landed on her R shoulder. She reports additional bruising and pain in her R shoulder. She has a follow-up appt scheduled with her orthopedist for 10/15/18. 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 ROS negative for red flags.    Diagnostic tests  Plain film radiographs with decreased healing per pt report    Patient Stated Goals  Improve ability to use RUE and decrease pain    Currently in Pain?  No/denies    Pain Onset  --         TREATMENT   Ther-ex Supine AROM R shoulder flexionthrough available rangex 10; Supine R shoulder PROM flexion, abduction, ER, IR, horizontal abd/add with end range holds; L sidelying R shoulder AROM abduction through available range x 10; L sidelying R shoulder AROM ER with 3s hold x 10; Prone R shoulder extension AROM with 3s hold x 10; Prone R shoulder horizontal abduction AROM with 3s hold x 10; Prone R shoulder "Y's" with assist from therapist x 10; Standing R shoulder AROM flexion and scaption with mirror feedback as well as verbal/tactile cues to avoid shoulder hiking x  10 each.  Seated scapular retractions 3s hold x 10, verbal and tactile cues to avoid upper trap hiking; Standing finger ladder wall walks for flexion and abduction x 10 each;   Pt educated throughout session about proper posture and technique.Improved technique, movement at target joints, after min to mod verbal, visual, tactile cues.    Pt continues to demonstrate guarding during AROM of R shoulder. Pt provided verbal and tactile feedback with AROM in standing as well as assist to decrease R shoulder hiking during flexion and scaption. Encouraged pt to continue her  AAROM/AROM at home in supine, sidelying, and standing. Called MD and left message for clarity regarding continuation/discontinuation of therapy pending MRI results.Encouraged pt to follow-up as scheduledand continue HEP.Pt will benefit from PT services to address deficits in strength,mobility, and painin order to return to full function at home.                           PT Short Term Goals - 10/30/18 1004      PT SHORT TERM GOAL #1   Title  Pt will be independent with HEP in order to improve strength and decrease pain in order to improve pain-free function at home.    Time  4    Period  Weeks    Status  Achieved        PT Long Term Goals - 10/30/18 1004      PT LONG TERM GOAL #1   Title  Pt will decrease quick DASH score by at least 8% in order to demonstrate clinically significant reduction in disability.    Baseline  09/30/18: 84.1%; 10/30/18: 56.8%    Time  8    Period  Weeks    Status  Achieved      PT LONG TERM GOAL #2   Title  Pt will decrease worst pain as reported on NPRS by at least 3 points in order to demonstrate clinically significant reduction in pain.    Baseline  09/30/18: worst: 9/10; 10/30/18: 6/10;    Time  8    Period  Weeks    Status  Achieved      PT LONG TERM GOAL #3   Title  Pt will improve PROM of R shoulder to within 10 degrees of R shoulder in order to  improve functional use of R shoulder for ADLs/IADLs.    Baseline  R/L 129/150 Shoulder flexion, 100/165 Shoulder abduction, 50/93 Shoulder external rotation, 50/70 Shoulder internal rotation; 10/30/18: R/L 140/150 Shoulder flexion, 132/165 Shoulder abduction, 82/93 Shoulder external rotation, 70/70 Shoulder internal rotation    Time  8    Period  Weeks    Status  Partially Met    Target Date  11/25/18      PT LONG TERM GOAL #4   Title  Pt will decrease worst pain as reported on NPRS by at least 3 points (3 or less) in order to demonstrate clinically significant reduction in pain.    Baseline  09/30/18: worst: 9/10; 10/30/18: 6/10;    Time  8    Period  Weeks    Status  New    Target Date  11/25/17            Plan - 11/18/18 1027    Clinical Impression Statement  Pt continues to demonstrate guarding during AROM of R shoulder. Pt provided verbal and tactile feedback with AROM in standing as well as assist to decrease R shoulder hiking during flexion and scaption. Encouraged pt to continue her AAROM/AROM at home in supine, sidelying, and standing. Called MD and left message for clarity regarding continuation/discontinuation of therapy pending MRI results.Encouraged pt to follow-up as scheduledand continue HEP.Pt will benefit from PT services to address deficits in strength,mobility, and painin order to return to full function at home.    Rehab Potential  Good    PT Frequency  2x / week    PT Duration  8 weeks    PT Treatment/Interventions  ADLs/Self Care Home Management;Aquatic Therapy;Canalith Repostioning;Cryotherapy;Electrical Stimulation;Iontophoresis '4mg'$ /ml Dexamethasone;Moist  Heat;Traction;Ultrasound;Contrast Bath;DME Instruction;Gait training;Stair training;Functional mobility training;Therapeutic activities;Therapeutic exercise;Balance training;Neuromuscular re-education;Patient/family education;Manual techniques;Passive range of motion;Dry needling;Splinting;Taping;Vestibular     PT Next Visit Plan  Review HEP and progress as needed, Progress PROM/AAROM/AROM of shoulder    PT Home Exercise Plan  Cervical retractions, Scapular retractions, R upper trap stretch, supine canes for AAROM flexion with hands together progressing to AROM supine flexion, AROM sidelying shoulder abduction, AROM sidelying shoulder ER    Consulted and Agree with Plan of Care  Patient       Patient will benefit from skilled therapeutic intervention in order to improve the following deficits and impairments:  Decreased strength, Decreased range of motion, Impaired UE functional use, Pain  Visit Diagnosis: Acute pain of right shoulder  Stiffness of right shoulder, not elsewhere classified  Muscle weakness (generalized)     Problem List Patient Active Problem List   Diagnosis Date Noted  . Malignant melanoma ([place]) 11/23/2016  . Urge incontinence 11/23/2016  . Atypical mole 09/22/2016  . Slurring of speech 05/12/2016  . Traumatic arthritis elbow 05/04/2015  . Left foot pain 12/31/2014  . Severe sprain of left ankle 12/31/2014  . Osteopenia 09/15/2014  . History of TIA (transient ischemic attack) 05/19/2014  . Fracture of coronoid process of ulna, left, closed 04/25/2014  . Ingrown toenail 01/15/2014  . Obesity (BMI 30-39.9) 10/08/2013  . OA (osteoarthritis) of knee 07/07/2013  . Chronic low back pain 05/09/2013  . Obstructive sleep apnea 05/09/2013  . GERD (gastroesophageal reflux disease) 05/09/2013  . Hiatal hernia 05/09/2013  . Fibromyalgia syndrome 05/09/2013  . Depression, major, in partial remission ([place]) 05/09/2013  . RLS (restless legs syndrome) 05/09/2013  . Diabetes mellitus with no complication ([place]) 32/91/9166  . High cholesterol 05/09/2013  . 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 Family history of colon cancer 05/09/2013  . Allergic rhinitis 05/09/2013  . Lateral meniscal tear 09/04/2012  . CAD (coronary artery disease) 09/27/2011  . HTN (hypertension) 09/27/2011   . Paroxysmal A-fib ([place]) 09/27/2011   Phillips Grout PT, DPT, GCS  Huprich,Jason 11/18/2018, 10:40 PM  [place] MAIN [facility] SERVICES [address] [place], [place], [address] Phone: [phone]   Fax:  [phone]  Name: Morgan [name] MRN: [id number] Date of Birth: 07-Aug-1941

## 2018-11-20 ENCOUNTER — Ambulatory Visit: Payer: Medicare Other

## 2018-11-25 ENCOUNTER — Ambulatory Visit: Payer: Medicare Other

## 2018-11-25 DIAGNOSIS — M25611 Stiffness of right shoulder, not elsewhere classified: Secondary | ICD-10-CM

## 2018-11-25 DIAGNOSIS — M25511 Pain in right shoulder: Secondary | ICD-10-CM

## 2018-11-25 DIAGNOSIS — M6281 Muscle weakness (generalized): Secondary | ICD-10-CM

## 2018-11-25 NOTE — Therapy (Signed)
[place] MAIN [facility] SERVICES [address] [place], [place], [address] Phone: [phone]   Fax:  [phone]  Physical Therapy Treatment  Patient Details  Name: Morgan [name] MRN: [id number] Date of Birth: [birth year] Referring Provider (PT): Dr. Mayer Camel   Encounter Date: 11/25/2018  PT End of Session - 11/25/18 1005    Visit Number  12    Number of Visits  17    Date for PT Re-Evaluation  11/25/18    Authorization Type  Last goals: 10/30/18, 09/30/18 (eval)    PT Start Time  0948    PT Stop Time  1030    PT Time Calculation (min)  42 min    Activity Tolerance  Patient limited by pain    Behavior During Therapy  [facility] for tasks assessed/performed       Past Medical History:  Diagnosis Date  . Angina   . Arthritis   . Atrial fibrillation (HCC)    ASPIRIN FOR BLOOD THINNER  . Atypical mole 09/22/2016  . Bulging discs    lumbar   . Cancer ([place])    melanoma  . Complication of anesthesia    pt states has choking sensation with ET tube   . Coronary artery disease   . Depression   . Diabetes mellitus    diet controlled/on meds  . Fibromyalgia   . GERD (gastroesophageal reflux disease)   . H/O hiatal hernia   . Headache(784.0)    "recurring"  . High cholesterol   . Hyperlipidemia   . Hypertension   . 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 Migraines    "til ~ 1980"  . PONV (postoperative nausea and vomiting)   . Restless leg syndrome   . Sciatic nerve pain    "from pinched nerve"  . Sleep apnea    uses CPAP  . Weakness of right side of body    "I've had PT for it; they don't know what it's from".  CORTISONE INJECTION INTO BACK 08/30/12    Past Surgical History:  Procedure Laterality Date  . [place] STUDY N/A 12/29/2015   Procedure: [place] STUDY;  Surgeon: Manus Gunning, MD;  Location: [facility];  Service: Gastroenterology;  Laterality: N/A;  . CARPAL TUNNEL RELEASE Left 07/02/2015   Procedure: CARPAL TUNNEL RELEASE;   Surgeon: Frederik Pear, MD;  Location: [facility];  Service: Orthopedics;  Laterality: Left;  . CATARACT EXTRACTION, BILATERAL Bilateral    Oct and Nov 2017  . CORONARY ANGIOPLASTY WITH STENT PLACEMENT  06/2011   "1"  . CORONARY ARTERY BYPASS GRAFT  2005   CABG X 2  . DILATION AND CURETTAGE OF UTERUS     "more than once"  . ELBOW ARTHROSCOPY Left 07/02/2015   Procedure: ARTHROSCOPY LEFT ELBOW WITH DEBRIDEMENT AND REMOVAL LOOSE BODY;  Surgeon: Frederik Pear, MD;  Location: [place];  Service: Orthopedics;  Laterality: Left;  . ESOPHAGEAL MANOMETRY N/A 12/29/2015   Procedure: ESOPHAGEAL MANOMETRY (EM);  Surgeon: Manus Gunning, MD;  Location: [facility];  Service: Gastroenterology;  Laterality: N/A;  . FRACTURE SURGERY  ~ 2005   nose  . KNEE ARTHROSCOPY  09/04/2012   Procedure: ARTHROSCOPY KNEE;  Surgeon: Gearlean Alf, MD;  Location: [facility];  Service: Orthopedics;  Laterality: Right;  right knee arthroscopy with medial and lateral meniscus debridement  . 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 MOUTH SURGERY  2004   "bone replacement;  had cadavear bones put in; face was collapsing"  . NASAL SEPTUM SURGERY  ~ 1986  . TOTAL KNEE ARTHROPLASTY Right 07/07/2013   Procedure: RIGHT TOTAL KNEE ARTHROPLASTY;  Surgeon: Gearlean Alf, MD;  Location: [facility];  Service: Orthopedics;  Laterality: Right;    There were no vitals filed for this visit.  Subjective Assessment - 11/25/18 1003    Subjective  Pt reports she is doing well today. Denies resting pain upon arrival. She saw the orthopedic PA who discussed the results of her MRI. She was advised that she could continue PT and progress with strengthening. Physical therapist also spoke with PA-C over the phone who reported that pt can continue and progress with therapy.     Pertinent History  On 08/03/18 pt was carrying a folding table and fell and right arm was under the handle got stuck between  the door and the table. She landed on her R side. Tried to get up and had to pull at her arm. Pt had immediate pain in her right shoulder. She waiteduntil Monday and went to see Dr. Edilia Bo her PCP. He took radiographs but didn't find any fractures. She continued to have pain so she called Dr. Damita Dunnings office, her orthopedist, and saw him that same week. He took additional radiographs and found a hairline fracture in her humeral shaft and a greater tuberosity fracture per patient report. She states that she is not healing well and has recurrent plain film radiographs. She is still wearing a sling and was encouraged not to perform any AROM of her right shoulder. Since her last visit with Dr. Mayer Camel on 09/20/18 she has had an additional fall and landed on her R shoulder. She reports additional bruising and pain in her R shoulder. She has a follow-up appt scheduled with her orthopedist for 10/15/18. 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 ROS negative for red flags.    Diagnostic tests  Plain film radiographs with decreased healing per pt report    Patient Stated Goals  Improve ability to use RUE and decrease pain    Currently in Pain?  No/denies             TREATMENT   Ther-ex Pt brought MRI report but did not understand it and states that she didn't get much of an explanation regarding the findings. Reviewed MRI report with patient with respect to her progression with physical therapy to ensure full understanding and to alleviate fear.  Supine AROM R shoulder flexionthrough available rangex 10; Supine R shoulder PROM flexion, abduction, ER, IR, horizontal abd/add with end range holds; L sidelying R shoulder AROM abduction through available range x 10; L sidelying R shoulder AROM ER with 3s hold x 10; Standing R shoulder AROM flexion and scaption with mirror feedback as well as verbal/tactile cues to avoid shoulder hiking x 10 each.Some scapular assist provided  for scaption. Standing R shoulder isometrics in doorway, 5s hold x 5 each direction for flexion, extension, abduction, and adduction. Attempted external rotation however pt struggles significantly to understand the difference between abduction and ER so discontinued.    Manual Therapy Posteroinferior R GH mobilizations at available end range flexion, grade II-III, 30s/bout x 3 bouts Inferior R GH mobilizations at available end range abduction, grade II, 30s/bout x 3 bouts;   Pt educated throughout session about proper posture and technique.Improved technique, movement at target joints, after min to mod verbal, visual, tactile cues.  Pt brought MRI report but did not understand it and states that she didn't get much of an explanation regarding the findings. Reviewed MRI report with patient with respect to her progression with physical therapy to ensure full understanding and to alleviate fear. Initiated R shoulder isometric strengthening in standing for flexion, extension, abduction, and adduction and provided HEP. Attempted external rotation however pt struggles significantly to understand the difference between abduction and ER so discontinued. She denies any pain during all isometric exercises. Pt continues to demonstrate guarding during AROM of R shoulder but less so than during prior sessions.Pt will need updated outcome measures/goals as well as re certification at next visit. Encouraged pt to follow-up as scheduledand continue HEP.Pt will benefit from PT services to address deficits in strength,mobility, and painin order to return to full function at home.                 PT Short Term Goals - 10/30/18 1004      PT SHORT TERM GOAL #1   Title  Pt will be independent with HEP in order to improve strength and decrease pain in order to improve pain-free function at home.    Time  4    Period  Weeks    Status  Achieved        PT Long Term Goals - 10/30/18 1004       PT LONG TERM GOAL #1   Title  Pt will decrease quick DASH score by at least 8% in order to demonstrate clinically significant reduction in disability.    Baseline  09/30/18: 84.1%; 10/30/18: 56.8%    Time  8    Period  Weeks    Status  Achieved      PT LONG TERM GOAL #2   Title  Pt will decrease worst pain as reported on NPRS by at least 3 points in order to demonstrate clinically significant reduction in pain.    Baseline  09/30/18: worst: 9/10; 10/30/18: 6/10;    Time  8    Period  Weeks    Status  Achieved      PT LONG TERM GOAL #3   Title  Pt will improve PROM of R shoulder to within 10 degrees of R shoulder in order to improve functional use of R shoulder for ADLs/IADLs.    Baseline  R/L 129/150 Shoulder flexion, 100/165 Shoulder abduction, 50/93 Shoulder external rotation, 50/70 Shoulder internal rotation; 10/30/18: R/L 140/150 Shoulder flexion, 132/165 Shoulder abduction, 82/93 Shoulder external rotation, 70/70 Shoulder internal rotation    Time  8    Period  Weeks    Status  Partially Met    Target Date  11/25/18      PT LONG TERM GOAL #4   Title  Pt will decrease worst pain as reported on NPRS by at least 3 points (3 or less) in order to demonstrate clinically significant reduction in pain.    Baseline  09/30/18: worst: 9/10; 10/30/18: 6/10;    Time  8    Period  Weeks    Status  New    Target Date  11/25/17            Plan - 11/25/18 1006    Clinical Impression Statement  Pt brought MRI report but did not understand it and states that she didn't get much of an explanation regarding the findings. Reviewed MRI report with patient with respect to her progression with physical therapy to ensure full understanding and to alleviate fear.  Initiated R shoulder isometric strengthening in standing for flexion, extension, abduction, and adduction and provided HEP. Attempted external rotation however pt struggles significantly to understand the difference between abduction and  ER so discontinued. She denies any pain during all isometric exercises. Pt continues to demonstrate guarding during AROM of R shoulder but less so than during prior sessions.Pt will need updated outcome measures/goals as well as re certification at next visit. Encouraged pt to follow-up as scheduledand continue HEP.Pt will benefit from PT services to address deficits in strength,mobility, and painin order to return to full function at home.    Rehab Potential  Good    PT Frequency  2x / week    PT Duration  8 weeks    PT Treatment/Interventions  ADLs/Self Care Home Management;Aquatic Therapy;Canalith Repostioning;Cryotherapy;Electrical Stimulation;Iontophoresis '4mg'$ /ml Dexamethasone;Moist Heat;Traction;Ultrasound;Contrast Bath;DME Instruction;Gait training;Stair training;Functional mobility training;Therapeutic activities;Therapeutic exercise;Balance training;Neuromuscular re-education;Patient/family education;Manual techniques;Passive range of motion;Dry needling;Splinting;Taping;Vestibular    PT Next Visit Plan  Update outcome measures/goals, recertification, Review HEP and progress as needed, Progress AAROM/AROM/strengthening of shoulder as tolerated without pain    PT Home Exercise Plan  Cervical retractions, Scapular retractions, R upper trap stretch, supine canes for AAROM flexion with hands together progressing to AROM supine flexion, AROM sidelying shoulder abduction, AROM sidelying shoulder ER    Consulted and Agree with Plan of Care  Patient       Patient will benefit from skilled therapeutic intervention in order to improve the following deficits and impairments:  Decreased strength, Decreased range of motion, Impaired UE functional use, Pain  Visit Diagnosis: Acute pain of right shoulder  Stiffness of right shoulder, not elsewhere classified  Muscle weakness (generalized)     Problem List Patient Active Problem List   Diagnosis Date Noted  . Malignant melanoma ([place])  11/23/2016  . Urge incontinence 11/23/2016  . Atypical mole 09/22/2016  . Slurring of speech 05/12/2016  . Traumatic arthritis elbow 05/04/2015  . Left foot pain 12/31/2014  . Severe sprain of left ankle 12/31/2014  . Osteopenia 09/15/2014  . History of TIA (transient ischemic attack) 05/19/2014  . Fracture of coronoid process of ulna, left, closed 04/25/2014  . Ingrown toenail 01/15/2014  . Obesity (BMI 30-39.9) 10/08/2013  . OA (osteoarthritis) of knee 07/07/2013  . Chronic low back pain 05/09/2013  . Obstructive sleep apnea 05/09/2013  . GERD (gastroesophageal reflux disease) 05/09/2013  . Hiatal hernia 05/09/2013  . Fibromyalgia syndrome 05/09/2013  . Depression, major, in partial remission ([place]) 05/09/2013  . RLS (restless legs syndrome) 05/09/2013  . Diabetes mellitus with no complication ([place]) 97/67/3419  . High cholesterol 05/09/2013  . 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 Family history of colon cancer 05/09/2013  . Allergic rhinitis 05/09/2013  . Lateral meniscal tear 09/04/2012  . CAD (coronary artery disease) 09/27/2011  . HTN (hypertension) 09/27/2011  . Paroxysmal A-fib ([place]) 09/27/2011   Phillips Grout PT, DPT, GCS  Huprich,Jason 11/25/2018, 1:18 PM  [place] MAIN [facility] SERVICES [address] [place], [place], [address] Phone: [phone]   Fax:  [phone]  Name: Loranda Mastel MRN: [id number] Date of Birth: [birth year]

## 2018-11-25 NOTE — Patient Instructions (Signed)
Access Code: [email or web address]  URL: [email or web address]  Date: 11/25/2018  Prepared by: Roxana Hires   Exercises  Isometric Shoulder Flexion at Wall - 10 reps - 1 sets - 2x daily - 7x weekly  Isometric Shoulder Extension at Wall - 10 reps - 1 sets - 2x daily - 7x weekly  Isometric Shoulder Abduction at Wall - 10 reps - 1 sets - 2x daily - 7x weekly  Isometric Shoulder Adduction - 10 reps - 1 sets - 2x daily - 7x weekly

## 2018-11-27 ENCOUNTER — Ambulatory Visit: Payer: Medicare Other

## 2018-11-27 VITALS — BP 121/62 | HR 76

## 2018-11-27 DIAGNOSIS — M6281 Muscle weakness (generalized): Secondary | ICD-10-CM

## 2018-11-27 DIAGNOSIS — M25611 Stiffness of right shoulder, not elsewhere classified: Secondary | ICD-10-CM

## 2018-11-27 DIAGNOSIS — M25511 Pain in right shoulder: Secondary | ICD-10-CM

## 2018-11-27 NOTE — Therapy (Signed)
[place] MAIN [facility] SERVICES [address] [place], [place], [address] Phone: [phone]   Fax:  [phone]  Physical Therapy Treatment/Recertification  Patient Details  Name: Morgan [name] MRN: [id number] Date of Birth: 11-04-1941 Referring Provider (PT): Dr. Mayer Camel   Encounter Date: 11/27/2018  PT End of Session - 11/27/18 0959    Visit Number  13    Number of Visits  33    Date for PT Re-Evaluation  01/22/19    Authorization Type  Last goals: 11/27/18, 09/30/18 (eval)    PT Start Time  0950    PT Stop Time  1030    PT Time Calculation (min)  40 min    Activity Tolerance  Patient limited by pain    Behavior During Therapy  [facility] for tasks assessed/performed       Past Medical History:  Diagnosis Date  . Angina   . Arthritis   . Atrial fibrillation (HCC)    ASPIRIN FOR BLOOD THINNER  . Atypical mole 09/22/2016  . Bulging discs    lumbar   . Cancer ([place])    melanoma  . Complication of anesthesia    pt states has choking sensation with ET tube   . Coronary artery disease   . Depression   . Diabetes mellitus    diet controlled/on meds  . Fibromyalgia   . GERD (gastroesophageal reflux disease)   . H/O hiatal hernia   . Headache(784.0)    "recurring"  . High cholesterol   . Hyperlipidemia   . Hypertension   . 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 Migraines    "til ~ 1980"  . PONV (postoperative nausea and vomiting)   . Restless leg syndrome   . Sciatic nerve pain    "from pinched nerve"  . Sleep apnea    uses CPAP  . Weakness of right side of body    "I've had PT for it; they don't know what it's from".  CORTISONE INJECTION INTO BACK 08/30/12    Past Surgical History:  Procedure Laterality Date  . [place] STUDY N/A 12/29/2015   Procedure: [place] STUDY;  Surgeon: Manus Gunning, MD;  Location: [facility];  Service: Gastroenterology;  Laterality: N/A;  . CARPAL TUNNEL RELEASE Left 07/02/2015   Procedure: CARPAL  TUNNEL RELEASE;  Surgeon: Frederik Pear, MD;  Location: [place];  Service: Orthopedics;  Laterality: Left;  . CATARACT EXTRACTION, BILATERAL Bilateral    Oct and Nov 2017  . CORONARY ANGIOPLASTY WITH STENT PLACEMENT  06/2011   "1"  . CORONARY ARTERY BYPASS GRAFT  2005   CABG X 2  . DILATION AND CURETTAGE OF UTERUS     "more than once"  . ELBOW ARTHROSCOPY Left 07/02/2015   Procedure: ARTHROSCOPY LEFT ELBOW WITH DEBRIDEMENT AND REMOVAL LOOSE BODY;  Surgeon: Frederik Pear, MD;  Location: [place];  Service: Orthopedics;  Laterality: Left;  . ESOPHAGEAL MANOMETRY N/A 12/29/2015   Procedure: ESOPHAGEAL MANOMETRY (EM);  Surgeon: Manus Gunning, MD;  Location: [facility];  Service: Gastroenterology;  Laterality: N/A;  . FRACTURE SURGERY  ~ 2005   nose  . KNEE ARTHROSCOPY  09/04/2012   Procedure: ARTHROSCOPY KNEE;  Surgeon: Gearlean Alf, MD;  Location: [facility];  Service: Orthopedics;  Laterality: Right;  right knee arthroscopy with medial and lateral meniscus debridement  . 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 MOUTH SURGERY  2004   "bone replacement;  had cadavear bones put in; face was collapsing"  . NASAL SEPTUM SURGERY  ~ 1986  . TOTAL KNEE ARTHROPLASTY Right 07/07/2013   Procedure: RIGHT TOTAL KNEE ARTHROPLASTY;  Surgeon: Gearlean Alf, MD;  Location: [facility];  Service: Orthopedics;  Laterality: Right;    Vitals:   11/27/18 0955  BP: 121/62  Pulse: 76  SpO2: 99%    Subjective Assessment - 11/27/18 0955    Subjective  Pt reports she is doing well today. Denies resting pain upon arrival. She states that after she left the clinic the other day her BP was elevated when she checked it at home. However it came down over the next few hours and days. She was fatigued but did not have any weakness, numbness, or dysarthria.     Pertinent History  On 08/03/18 pt was carrying a folding table and fell and right arm was under the handle  got stuck between the door and the table. She landed on her R side. Tried to get up and had to pull at her arm. Pt had immediate pain in her right shoulder. She waiteduntil Monday and went to see Dr. Edilia Bo her PCP. He took radiographs but didn't find any fractures. She continued to have pain so she called Dr. Damita Dunnings office, her orthopedist, and saw him that same week. He took additional radiographs and found a hairline fracture in her humeral shaft and a greater tuberosity fracture per patient report. She states that she is not healing well and has recurrent plain film radiographs. She is still wearing a sling and was encouraged not to perform any AROM of her right shoulder. Since her last visit with Dr. Mayer Camel on 09/20/18 she has had an additional fall and landed on her R shoulder. She reports additional bruising and pain in her R shoulder. She has a follow-up appt scheduled with her orthopedist for 10/15/18. 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 ROS negative for red flags.    Diagnostic tests  Plain film radiographs with decreased healing per pt report    Patient Stated Goals  Improve ability to use RUE and decrease pain    Currently in Pain?  No/denies          TREATMENT   Ther-ex Warm-up on UBE 2 minutes forward/1 minutes backwards for AAROM with therapist monitoring for pain, no pain initially but increasing soreness after 1 minute when moving backwards so stopped; Standing R shoulder isometrics in doorway, 5s hold x 5 each direction for flexion, extension, abduction, and adduction. Pt continues to require significant verbal and tactile cues; Updated goals with patient and discussed plan of care moving forward;   Manual Therapy R shoulder PROM flexion, scaption, IR, and ER with end range holds. Pt able to gradually allow for more PROM with cues to relax andrhythmic perturbationsof R shoulder; Measurements obtained for R shoulder PROM: R/L  159(pain)/150 Shoulder flexion, 137(pain)/165 Shoulder abduction, 81/85 Shoulder external rotation, 70/70 Shoulder internal rotation Pt completed QuickDASH (unbilled)=45.45% Posteroinferior R GH mobilizations at available end range flexion, grade II-III, 30s/bout x 3 bouts Inferior R GH mobilizations at available end range abduction, grade II, 30s/bout x 3 bouts;   Pt educated throughout session about proper posture and technique.Improved technique, movement at target joints, after min to mod verbal, visual, tactile cues.    Pt demonstrates good motivation during session. Continued R shoulder isometric strengthening in standing for flexion, extension, abduction, and adduction. She denies any pain during all  isometric exercises. Pt continues to demonstrateguarding during AROMof R shoulder but less so than during prior sessions.Her passive range of motion is progressing and her flexion, ER, and IR are roughly symmetrical however she remains limited in abduction. She also remains weak with AROM against gravity. Encouraged pt to follow-up as scheduledand continue HEP.Pt will benefit from PT services to address deficits in strength,mobility, and painin order to return to full function at home.                              PT Short Term Goals - 11/27/18 1006      PT SHORT TERM GOAL #1   Title  Pt will be independent with HEP in order to improve strength and decrease pain in order to improve pain-free function at home.    Time  4    Period  Weeks    Status  Achieved        PT Long Term Goals - 11/27/18 1006      PT LONG TERM GOAL #1   Title  Pt will decrease quick DASH score by at least 8% in order to demonstrate clinically significant reduction in disability.    Baseline  09/30/18: 84.1%; 10/30/18: 56.8%; 11/27/18: 45.45%    Time  8    Period  Weeks    Status  Achieved      PT LONG TERM GOAL #2   Title  Pt will decrease worst pain as reported on  NPRS by at least 3 points in order to demonstrate clinically significant reduction in pain.    Baseline  09/30/18: worst: 9/10; 10/30/18: 6/10;     Time  8    Period  Weeks    Status  Achieved      PT LONG TERM GOAL #3   Title  Pt will improve PROM of R shoulder to within 10 degrees of R shoulder in order to improve functional use of R shoulder for ADLs/IADLs.    Baseline  R/L 129/150 Shoulder flexion, 100/165 Shoulder abduction, 50/93 Shoulder external rotation, 50/70 Shoulder internal rotation; 10/30/18: R/L 140/150 Shoulder flexion, 132/165 Shoulder abduction, 82/93 Shoulder external rotation, 70/70 Shoulder internal rotation    Time  8    Period  Weeks    Status  Partially Met    Target Date  01/22/19      PT LONG TERM GOAL #4   Title  Pt will decrease worst pain as reported on NPRS by at least 3 points (3 or less) in order to demonstrate clinically significant reduction in pain.    Baseline  10/30/18: 6/10; 11/27/18: 4/10    Time  8    Period  Weeks    Status  Partially Met    Target Date  01/22/19      PT LONG TERM GOAL #5   Title  Pt will decrease quick DASH score by at least 8% in order to demonstrate clinically significant reduction in disability.    Baseline  11/27/18: 45.45%    Time  8    Period  Weeks    Status  New    Target Date  01/22/19            Plan - 11/27/18 1152    Clinical Impression Statement  Pt demonstrates good motivation during session. Continued R shoulder isometric strengthening in standing for flexion, extension, abduction, and adduction. She denies any pain during all isometric exercises. Pt continues to  demonstrateguarding during AROMof R shoulder but less so than during prior sessions.Her passive range of motion is progressing and her flexion, ER, and IR are roughly symmetrical however she remains limited in abduction. She also remains weak with AROM against gravity. Encouraged pt to follow-up as scheduledand continue HEP.Pt will benefit  from PT services to address deficits in strength,mobility, and painin order to return to full function at home.    Rehab Potential  Good    PT Frequency  2x / week    PT Duration  8 weeks    PT Treatment/Interventions  ADLs/Self Care Home Management;Aquatic Therapy;Canalith Repostioning;Cryotherapy;Electrical Stimulation;Iontophoresis '4mg'$ /ml Dexamethasone;Moist Heat;Traction;Ultrasound;Contrast Bath;DME Instruction;Gait training;Stair training;Functional mobility training;Therapeutic activities;Therapeutic exercise;Balance training;Neuromuscular re-education;Patient/family education;Manual techniques;Passive range of motion;Dry needling;Splinting;Taping;Vestibular    PT Next Visit Plan  Review HEP and progress as needed, Progress AAROM/AROM/strengthening of shoulder as tolerated without pain    PT Home Exercise Plan  Cervical retractions, Scapular retractions, R upper trap stretch, supine canes for AAROM flexion with hands together progressing to AROM supine flexion, AROM sidelying shoulder abduction, AROM sidelying shoulder ER    Consulted and Agree with Plan of Care  Patient       Patient will benefit from skilled therapeutic intervention in order to improve the following deficits and impairments:  Decreased strength, Decreased range of motion, Impaired UE functional use, Pain  Visit Diagnosis: Acute pain of right shoulder  Stiffness of right shoulder, not elsewhere classified  Muscle weakness (generalized)     Problem List Patient Active Problem List   Diagnosis Date Noted  . Malignant melanoma ([place]) 11/23/2016  . Urge incontinence 11/23/2016  . Atypical mole 09/22/2016  . Slurring of speech 05/12/2016  . Traumatic arthritis elbow 05/04/2015  . Left foot pain 12/31/2014  . Severe sprain of left ankle 12/31/2014  . Osteopenia 09/15/2014  . History of TIA (transient ischemic attack) 05/19/2014  . Fracture of coronoid process of ulna, left, closed 04/25/2014  . Ingrown toenail  01/15/2014  . Obesity (BMI 30-39.9) 10/08/2013  . OA (osteoarthritis) of knee 07/07/2013  . Chronic low back pain 05/09/2013  . Obstructive sleep apnea 05/09/2013  . GERD (gastroesophageal reflux disease) 05/09/2013  . Hiatal hernia 05/09/2013  . Fibromyalgia syndrome 05/09/2013  . Depression, major, in partial remission ([place]) 05/09/2013  . RLS (restless legs syndrome) 05/09/2013  . Diabetes mellitus with no complication ([place]) 78/00/4471  . High cholesterol 05/09/2013  . 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 Family history of colon cancer 05/09/2013  . Allergic rhinitis 05/09/2013  . Lateral meniscal tear 09/04/2012  . CAD (coronary artery disease) 09/27/2011  . HTN (hypertension) 09/27/2011  . Paroxysmal A-fib ([place]) 09/27/2011    Huprich,Jason 11/28/2018, 9:44 AM  [place] MAIN [facility] SERVICES [address] [place], [place], [address] Phone: [phone]   Fax:  [phone]  Name: Morgan [name] MRN: [id number] Date of Birth: Jan 21, 1941

## 2018-12-04 ENCOUNTER — Other Ambulatory Visit: Payer: Self-pay | Admitting: Family Medicine

## 2018-12-04 NOTE — Telephone Encounter (Signed)
Last office visit 08/05/18 with Dr. Lorelei Pont for pain of right humerus.  Last refilled 11/01/2018 for #30 with no refills.  Next Appt: 12/24/2018 for CPE.

## 2018-12-05 ENCOUNTER — Ambulatory Visit: Payer: Medicare Other

## 2018-12-05 DIAGNOSIS — M25511 Pain in right shoulder: Secondary | ICD-10-CM | POA: Diagnosis not present

## 2018-12-05 DIAGNOSIS — M25611 Stiffness of right shoulder, not elsewhere classified: Secondary | ICD-10-CM

## 2018-12-05 DIAGNOSIS — M6281 Muscle weakness (generalized): Secondary | ICD-10-CM

## 2018-12-05 NOTE — Therapy (Signed)
[place] MAIN [facility] SERVICES [address] [place], [place], [address] Phone: [phone]   Fax:  [phone]  Physical Therapy Treatment  Patient Details  Name: Morgan [name] MRN: [id number] Date of Birth: [birth year] Referring Provider (PT): Dr. Mayer Camel   Encounter Date: 12/05/2018  PT End of Session - 12/05/18 0942    Visit Number  14    Number of Visits  33    Date for PT Re-Evaluation  01/22/19    Authorization Type  Last goals: 11/27/18, 09/30/18 (eval)    PT Start Time  0935    PT Stop Time  1015    PT Time Calculation (min)  40 min    Activity Tolerance  Patient limited by pain    Behavior During Therapy  [facility] for tasks assessed/performed       Past Medical History:  Diagnosis Date  . Angina   . Arthritis   . Atrial fibrillation (HCC)    ASPIRIN FOR BLOOD THINNER  . Atypical mole 09/22/2016  . Bulging discs    lumbar   . Cancer ([place])    melanoma  . Complication of anesthesia    pt states has choking sensation with ET tube   . Coronary artery disease   . Depression   . Diabetes mellitus    diet controlled/on meds  . Fibromyalgia   . GERD (gastroesophageal reflux disease)   . H/O hiatal hernia   . Headache(784.0)    "recurring"  . High cholesterol   . Hyperlipidemia   . Hypertension   . 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 Migraines    "til ~ 1980"  . PONV (postoperative nausea and vomiting)   . Restless leg syndrome   . Sciatic nerve pain    "from pinched nerve"  . Sleep apnea    uses CPAP  . Weakness of right side of body    "I've had PT for it; they don't know what it's from".  CORTISONE INJECTION INTO BACK 08/30/12    Past Surgical History:  Procedure Laterality Date  . [place] STUDY N/A 12/29/2015   Procedure: [place] STUDY;  Surgeon: Manus Gunning, MD;  [facility];  Service: Gastroenterology;  Laterality: N/A;  . CARPAL TUNNEL RELEASE Left 07/02/2015   Procedure: CARPAL TUNNEL RELEASE;   Surgeon: Frederik Pear, MD;  Location: [place];  Service: Orthopedics;  Laterality: Left;  . CATARACT EXTRACTION, BILATERAL Bilateral    Oct and Nov 2017  . CORONARY ANGIOPLASTY WITH STENT PLACEMENT  06/2011   "1"  . CORONARY ARTERY BYPASS GRAFT  2005   CABG X 2  . DILATION AND CURETTAGE OF UTERUS     "more than once"  . ELBOW ARTHROSCOPY Left 07/02/2015   Procedure: ARTHROSCOPY LEFT ELBOW WITH DEBRIDEMENT AND REMOVAL LOOSE BODY;  Surgeon: Frederik Pear, MD;  Location: [place];  Service: Orthopedics;  Laterality: Left;  . ESOPHAGEAL MANOMETRY N/A 12/29/2015   Procedure: ESOPHAGEAL MANOMETRY (EM);  Surgeon: Manus Gunning, MD;  [facility];  Service: Gastroenterology;  Laterality: N/A;  . FRACTURE SURGERY  ~ 2005   nose  . KNEE ARTHROSCOPY  09/04/2012   Procedure: ARTHROSCOPY KNEE;  Surgeon: Gearlean Alf, MD;  [facility];  Service: Orthopedics;  Laterality: Right;  right knee arthroscopy with medial and lateral meniscus debridement  . 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 MOUTH SURGERY  2004   "bone replacement;  had cadavear bones put in; face was collapsing"  . NASAL SEPTUM SURGERY  ~ 1986  . TOTAL KNEE ARTHROPLASTY Right 07/07/2013   Procedure: RIGHT TOTAL KNEE ARTHROPLASTY;  Surgeon: Gearlean Alf, MD;  [facility];  Service: Orthopedics;  Laterality: Right;    There were no vitals filed for this visit.  Subjective Assessment - 12/05/18 0941    Subjective  Pt reports she is doing well today. Denies resting pain upon arrival. She believes that her shoulder is improving. No specific questions or concerns.     Pertinent History  On 08/03/18 pt was carrying a folding table and fell and right arm was under the handle got stuck between the door and the table. She landed on her R side. Tried to get up and had to pull at her arm. Pt had immediate pain in her right shoulder. She waiteduntil Monday and went to  see Dr. Edilia Bo her PCP. He took radiographs but didn't find any fractures. She continued to have pain so she called Dr. Damita Dunnings office, her orthopedist, and saw him that same week. He took additional radiographs and found a hairline fracture in her humeral shaft and a greater tuberosity fracture per patient report. She states that she is not healing well and has recurrent plain film radiographs. She is still wearing a sling and was encouraged not to perform any AROM of her right shoulder. Since her last visit with Dr. Mayer Camel on 09/20/18 she has had an additional fall and landed on her R shoulder. She reports additional bruising and pain in her R shoulder. She has a follow-up appt scheduled with her orthopedist for 10/15/18. 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 ROS negative for red flags.    Diagnostic tests  Plain film radiographs with decreased healing per pt report    Patient Stated Goals  Improve ability to use RUE and decrease pain    Currently in Pain?  No/denies           TREATMENT   Ther-ex Warm-up on UBE 2 minutes forward/2 minutes backwards for AAROM with therapist monitoring for pain, mild aching but no sharp pain; UE ranger for R shoulder flexion x 10, scaption x 10, CW circles x 10, CCW circles x 10; Standing R shoulder AROM flexion and scaption x 10 each with mirror feedback was well as verbal and tactile cues and assist as needed. More limitation noted in scaption;  Red tband rows with verbal cues for scapular retraction 2 x 10; Red tband R shoulder extension 2 x 10; Red tband bilateral shoulder ER and scapular retraction 2 x 10; Red tband R shoulder ER 2 x 10;   Manual Therapy R shoulder PROM flexion, scaption, IR, and ER with end range holds. Pt able to gradually allow for more PROM with cues to relax andrhythmic perturbationsof R shoulder; Posteroinferior R GH mobilizations at available end range flexion, grade II-III, 30s/bout x 3  bouts Inferior R GH mobilizations at available end range abduction, grade II, 30s/bout x 3 bouts;   Pt educated throughout session about proper posture and technique.Improved technique, movement at target joints, after min to mod verbal, visual, tactile cues.    Pt demonstrates good motivation during session. Improving R shoulder AROM against gravity with more range and less pain. She reports some mild ache during session in shoulder intermittently but denies any sharp pain. She continues to demonstrateguarding during [facility] R shoulderbut less so than during prior sessions.  Encouraged pt to follow-up as scheduledand continue HEP.Pt will benefit from PT services to address deficits in strength,mobility, and painin order to return to full function at home.                      PT Short Term Goals - 11/27/18 1006      PT SHORT TERM GOAL #1   Title  Pt will be independent with HEP in order to improve strength and decrease pain in order to improve pain-free function at home.    Time  4    Period  Weeks    Status  Achieved        PT Long Term Goals - 11/27/18 1006      PT LONG TERM GOAL #1   Title  Pt will decrease quick DASH score by at least 8% in order to demonstrate clinically significant reduction in disability.    Baseline  09/30/18: 84.1%; 10/30/18: 56.8%; 11/27/18: 45.45%    Time  8    Period  Weeks    Status  Achieved      PT LONG TERM GOAL #2   Title  Pt will decrease worst pain as reported on NPRS by at least 3 points in order to demonstrate clinically significant reduction in pain.    Baseline  09/30/18: worst: 9/10; 10/30/18: 6/10;     Time  8    Period  Weeks    Status  Achieved      PT LONG TERM GOAL #3   Title  Pt will improve PROM of R shoulder to within 10 degrees of R shoulder in order to improve functional use of R shoulder for ADLs/IADLs.    Baseline  R/L 129/150 Shoulder flexion, 100/165 Shoulder abduction, 50/93 Shoulder external  rotation, 50/70 Shoulder internal rotation; 10/30/18: R/L 140/150 Shoulder flexion, 132/165 Shoulder abduction, 82/93 Shoulder external rotation, 70/70 Shoulder internal rotation    Time  8    Period  Weeks    Status  Partially Met    Target Date  01/22/19      PT LONG TERM GOAL #4   Title  Pt will decrease worst pain as reported on NPRS by at least 3 points (3 or less) in order to demonstrate clinically significant reduction in pain.    Baseline  10/30/18: 6/10; 11/27/18: 4/10    Time  8    Period  Weeks    Status  Partially Met    Target Date  01/22/19      PT LONG TERM GOAL #5   Title  Pt will decrease quick DASH score by at least 8% in order to demonstrate clinically significant reduction in disability.    Baseline  11/27/18: 45.45%    Time  8    Period  Weeks    Status  New    Target Date  01/22/19            Plan - 12/05/18 0944    Clinical Impression Statement  Pt demonstrates good motivation during session. Improving R shoulder AROM against gravity with more range and less pain. She reports some mild ache during session in shoulder intermittently but denies any sharp pain. She continues to demonstrateguarding during [facility] R shoulderbut less so than during prior sessions. Encouraged pt to follow-up as scheduledand continue HEP.Pt will benefit from PT services to address deficits in strength,mobility, and painin order to return to full function at home.    Rehab Potential  Good  PT Frequency  2x / week    PT Duration  8 weeks    PT Treatment/Interventions  ADLs/Self Care Home Management;Aquatic Therapy;Canalith Repostioning;Cryotherapy;Electrical Stimulation;Iontophoresis '4mg'$ /ml Dexamethasone;Moist Heat;Traction;Ultrasound;Contrast Bath;DME Instruction;Gait training;Stair training;Functional mobility training;Therapeutic activities;Therapeutic exercise;Balance training;Neuromuscular re-education;Patient/family education;Manual techniques;Passive range of motion;Dry  needling;Splinting;Taping;Vestibular    PT Next Visit Plan  Review HEP and progress as needed, Progress AAROM/AROM/strengthening of shoulder as tolerated without pain    PT Home Exercise Plan  Cervical retractions, Scapular retractions, R upper trap stretch, supine canes for AAROM flexion with hands together progressing to AROM supine flexion, AROM sidelying shoulder abduction, AROM sidelying shoulder ER    Consulted and Agree with Plan of Care  Patient       Patient will benefit from skilled therapeutic intervention in order to improve the following deficits and impairments:  Decreased strength, Decreased range of motion, Impaired UE functional use, Pain  Visit Diagnosis: Acute pain of right shoulder  Stiffness of right shoulder, not elsewhere classified  Muscle weakness (generalized)     Problem List Patient Active Problem List   Diagnosis Date Noted  . Malignant melanoma ([place]) 11/23/2016  . Urge incontinence 11/23/2016  . Atypical mole 09/22/2016  . Slurring of speech 05/12/2016  . Traumatic arthritis elbow 05/04/2015  . Left foot pain 12/31/2014  . Severe sprain of left ankle 12/31/2014  . Osteopenia 09/15/2014  . History of TIA (transient ischemic attack) 05/19/2014  . Fracture of coronoid process of ulna, left, closed 04/25/2014  . Ingrown toenail 01/15/2014  . Obesity (BMI 30-39.9) 10/08/2013  . OA (osteoarthritis) of knee 07/07/2013  . Chronic low back pain 05/09/2013  . Obstructive sleep apnea 05/09/2013  . GERD (gastroesophageal reflux disease) 05/09/2013  . Hiatal hernia 05/09/2013  . Fibromyalgia syndrome 05/09/2013  . Depression, major, in partial remission ([place]) 05/09/2013  . RLS (restless legs syndrome) 05/09/2013  . Diabetes mellitus with no complication ([place]) 25/75/0518  . High cholesterol 05/09/2013  . 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 Family history of colon cancer 05/09/2013  . Allergic rhinitis 05/09/2013  . Lateral meniscal tear 09/04/2012  .  CAD (coronary artery disease) 09/27/2011  . HTN (hypertension) 09/27/2011  . Paroxysmal A-fib ([place]) 09/27/2011   Phillips Grout PT, DPT, GCS  , 12/05/2018, 11:29 AM  [place] MAIN [facility] SERVICES [address] [place], [place], [address] Phone: [phone]   Fax:  [phone]  Name: Kassadee Carawan MRN: [id number] Date of Birth: 1941-03-30

## 2018-12-10 ENCOUNTER — Ambulatory Visit: Payer: Medicare Other

## 2018-12-10 DIAGNOSIS — M25511 Pain in right shoulder: Secondary | ICD-10-CM

## 2018-12-10 DIAGNOSIS — M25611 Stiffness of right shoulder, not elsewhere classified: Secondary | ICD-10-CM

## 2018-12-10 DIAGNOSIS — M6281 Muscle weakness (generalized): Secondary | ICD-10-CM

## 2018-12-10 NOTE — Therapy (Signed)
[place] MAIN [facility] SERVICES [address] [place], [place], [address] Phone: [phone]   Fax:  [phone]  Physical Therapy Treatment  Patient Details  Name: Morgan [name] MRN: [id number] Date of Birth: [birth year] Referring Provider (PT): Dr. Mayer Camel   Encounter Date: 12/10/2018  PT End of Session - 12/10/18 1306    Visit Number  15    Number of Visits  33    Date for PT Re-Evaluation  01/22/19    Authorization Type  Last goals: 11/27/18, 09/30/18 (eval)    PT Start Time  1300    PT Stop Time  1345    PT Time Calculation (min)  45 min    Activity Tolerance  Patient limited by pain    Behavior During Therapy  [facility] for tasks assessed/performed       Past Medical History:  Diagnosis Date  . Angina   . Arthritis   . Atrial fibrillation (HCC)    ASPIRIN FOR BLOOD THINNER  . Atypical mole 09/22/2016  . Bulging discs    lumbar   . Cancer ([place])    melanoma  . Complication of anesthesia    pt states has choking sensation with ET tube   . Coronary artery disease   . Depression   . Diabetes mellitus    diet controlled/on meds  . Fibromyalgia   . GERD (gastroesophageal reflux disease)   . H/O hiatal hernia   . Headache(784.0)    "recurring"  . High cholesterol   . Hyperlipidemia   . Hypertension   . 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 Migraines    "til ~ 1980"  . PONV (postoperative nausea and vomiting)   . Restless leg syndrome   . Sciatic nerve pain    "from pinched nerve"  . Sleep apnea    uses CPAP  . Weakness of right side of body    "I've had PT for it; they don't know what it's from".  CORTISONE INJECTION INTO BACK 08/30/12    Past Surgical History:  Procedure Laterality Date  . [place] STUDY N/A 12/29/2015   Procedure: [place] STUDY;  Surgeon: Manus Gunning, MD;  [facility];  Service: Gastroenterology;  Laterality: N/A;  . CARPAL TUNNEL RELEASE Left 07/02/2015   Procedure: CARPAL TUNNEL RELEASE;   Surgeon: Frederik Pear, MD;  Location: [place];  Service: Orthopedics;  Laterality: Left;  . CATARACT EXTRACTION, BILATERAL Bilateral    Oct and Nov 2017  . CORONARY ANGIOPLASTY WITH STENT PLACEMENT  06/2011   "1"  . CORONARY ARTERY BYPASS GRAFT  2005   CABG X 2  . DILATION AND CURETTAGE OF UTERUS     "more than once"  . ELBOW ARTHROSCOPY Left 07/02/2015   Procedure: ARTHROSCOPY LEFT ELBOW WITH DEBRIDEMENT AND REMOVAL LOOSE BODY;  Surgeon: Frederik Pear, MD;  Location: [place];  Service: Orthopedics;  Laterality: Left;  . ESOPHAGEAL MANOMETRY N/A 12/29/2015   Procedure: ESOPHAGEAL MANOMETRY (EM);  Surgeon: Manus Gunning, MD;  [facility];  Service: Gastroenterology;  Laterality: N/A;  . FRACTURE SURGERY  ~ 2005   nose  . KNEE ARTHROSCOPY  09/04/2012   Procedure: ARTHROSCOPY KNEE;  Surgeon: Gearlean Alf, MD;  [facility];  Service: Orthopedics;  Laterality: Right;  right knee arthroscopy with medial and lateral meniscus debridement  . 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 MOUTH SURGERY  2004   "bone replacement;  had cadavear bones put in; face was collapsing"  . NASAL SEPTUM SURGERY  ~ 1986  . TOTAL KNEE ARTHROPLASTY Right 07/07/2013   Procedure: RIGHT TOTAL KNEE ARTHROPLASTY;  Surgeon: Gearlean Alf, MD;  [facility];  Service: Orthopedics;  Laterality: Right;    There were no vitals filed for this visit.  Subjective Assessment - 12/10/18 1303    Subjective  Patient reported pain at start of session, 3/10 in superior/lateral shoulder. Stated it started last night, and continued into the AM. She has been busy "sliding" boxes around this morning.     Pertinent History  On 08/03/18 pt was carrying a folding table and fell and right arm was under the handle got stuck between the door and the table. She landed on her R side. Tried to get up and had to pull at her arm. Pt had immediate pain in her right  shoulder. She waiteduntil Monday and went to see Dr. Edilia Bo her PCP. He took radiographs but didn't find any fractures. She continued to have pain so she called Dr. Damita Dunnings office, her orthopedist, and saw him that same week. He took additional radiographs and found a hairline fracture in her humeral shaft and a greater tuberosity fracture per patient report. She states that she is not healing well and has recurrent plain film radiographs. She is still wearing a sling and was encouraged not to perform any AROM of her right shoulder. Since her last visit with Dr. Mayer Camel on 09/20/18 she has had an additional fall and landed on her R shoulder. She reports additional bruising and pain in her R shoulder. She has a follow-up appt scheduled with her orthopedist for 10/15/18. 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 ROS negative for red flags.    Limitations  Lifting    Diagnostic tests  Plain film radiographs with decreased healing per pt report    Patient Stated Goals  Improve ability to use RUE and decrease pain    Currently in Pain?  Yes    Pain Score  3     Pain Location  Shoulder    Pain Orientation  Right    Pain Descriptors / Indicators  Dull;Aching;Sharp    Pain Type  Chronic pain    Pain Onset  1 to 4 weeks ago     TREATMENT:  Ther-ex  Warm-up on UBE 2 minutes forward/2 minutes backwards for AAROM with therapist monitoring for pain, mild aching but no sharp pain; Therapy ball roll ups for  R shoulder flexion x 10 (use of both arms to elevate ball) Standing R shoulder AROM scaption x 10 each with shoulder retraction, back up against a wall for tactile feedback, verbal and visual cues Red tband rows with verbal cues for scapular retraction 2 x 10; Red tband R shoulder extension 2 x 10; Red tband bilateral shoulder ER and scapular retraction 2 x 10; Red tband R shoulder ER 2 x 10; Standing IR and ER walkouts with YTB, difficulty maintaing neutral positioning, visual  and tactile cues throughout exercises Supine shoulder retraction x12 with tactile cues  Supine protraction/retraction of R shoulder 2x10 with tactile cues constantly and then intermittently     Manual Therapy  R shoulder PROM flexion, scaption, IR, and ER with end range holds. Pt able to gradually allow for more PROM with cues to relax and rhythmic perturbations of R shoulder; Posteroinferior R GH mobilizations at available end range flexion, grade II-III, 30s/bout x  3 bouts Inferior R GH mobilizations at available end range abduction, grade II, 30s/bout x 3 bouts;     Pt educated throughout session about proper posture and technique. Improved technique, movement at target joints, after min to mod verbal, visual, tactile cues.      PT Education - 12/10/18 1305    Education Details  therapeutic exercise form/technique    Person(s) Educated  Patient    Methods  Explanation;Demonstration;Verbal cues;Tactile cues    Comprehension  Verbalized understanding;Returned demonstration       PT Short Term Goals - 11/27/18 1006      PT SHORT TERM GOAL #1   Title  Pt will be independent with HEP in order to improve strength and decrease pain in order to improve pain-free function at home.    Time  4    Period  Weeks    Status  Achieved        PT Long Term Goals - 11/27/18 1006      PT LONG TERM GOAL #1   Title  Pt will decrease quick DASH score by at least 8% in order to demonstrate clinically significant reduction in disability.    Baseline  09/30/18: 84.1%; 10/30/18: 56.8%; 11/27/18: 45.45%    Time  8    Period  Weeks    Status  Achieved      PT LONG TERM GOAL #2   Title  Pt will decrease worst pain as reported on NPRS by at least 3 points in order to demonstrate clinically significant reduction in pain.    Baseline  09/30/18: worst: 9/10; 10/30/18: 6/10;     Time  8    Period  Weeks    Status  Achieved      PT LONG TERM GOAL #3   Title  Pt will improve PROM of R shoulder to  within 10 degrees of R shoulder in order to improve functional use of R shoulder for ADLs/IADLs.    Baseline  R/L 129/150 Shoulder flexion, 100/165 Shoulder abduction, 50/93 Shoulder external rotation, 50/70 Shoulder internal rotation; 10/30/18: R/L 140/150 Shoulder flexion, 132/165 Shoulder abduction, 82/93 Shoulder external rotation, 70/70 Shoulder internal rotation    Time  8    Period  Weeks    Status  Partially Met    Target Date  01/22/19      PT LONG TERM GOAL #4   Title  Pt will decrease worst pain as reported on NPRS by at least 3 points (3 or less) in order to demonstrate clinically significant reduction in pain.    Baseline  10/30/18: 6/10; 11/27/18: 4/10    Time  8    Period  Weeks    Status  Partially Met    Target Date  01/22/19      PT LONG TERM GOAL #5   Title  Pt will decrease quick DASH score by at least 8% in order to demonstrate clinically significant reduction in disability.    Baseline  11/27/18: 45.45%    Time  8    Period  Weeks    Status  New    Target Date  01/22/19            Plan - 12/10/18 1349    Clinical Impression Statement  Patient had good tolerance to activity today, intermittently complained of R shoulder pain with AROM, but able to complete and verbalize pain symptoms were manageable. Pt most challenged by scapular retraction in supine as well as scaption AROM. mod-max multimodal cues  needed throughout exercises to avoid compensational movements and for exercise technique. The patient would benefit from further skilled PT to continue to progress towards goals.    Rehab Potential  Good    PT Frequency  2x / week    PT Duration  8 weeks    PT Treatment/Interventions  ADLs/Self Care Home Management;Aquatic Therapy;Canalith Repostioning;Cryotherapy;Electrical Stimulation;Iontophoresis '4mg'$ /ml Dexamethasone;Moist Heat;Traction;Ultrasound;Contrast Bath;DME Instruction;Gait training;Stair training;Functional mobility training;Therapeutic  activities;Therapeutic exercise;Balance training;Neuromuscular re-education;Patient/family education;Manual techniques;Passive range of motion;Dry needling;Splinting;Taping;Vestibular    PT Next Visit Plan  Review HEP and progress as needed, Progress AAROM/AROM/strengthening of shoulder as tolerated without pain    PT Home Exercise Plan  Cervical retractions, Scapular retractions, R upper trap stretch, supine canes for AAROM flexion with hands together progressing to AROM supine flexion, AROM sidelying shoulder abduction, AROM sidelying shoulder ER    Consulted and Agree with Plan of Care  Patient       Patient will benefit from skilled therapeutic intervention in order to improve the following deficits and impairments:  Decreased strength, Decreased range of motion, Impaired UE functional use, Pain  Visit Diagnosis: Acute pain of right shoulder  Stiffness of right shoulder, not elsewhere classified  Muscle weakness (generalized)     Problem List Patient Active Problem List   Diagnosis Date Noted  . Malignant melanoma ([place]) 11/23/2016  . Urge incontinence 11/23/2016  . Atypical mole 09/22/2016  . Slurring of speech 05/12/2016  . Traumatic arthritis elbow 05/04/2015  . Left foot pain 12/31/2014  . Severe sprain of left ankle 12/31/2014  . Osteopenia 09/15/2014  . History of TIA (transient ischemic attack) 05/19/2014  . Fracture of coronoid process of ulna, left, closed 04/25/2014  . Ingrown toenail 01/15/2014  . Obesity (BMI 30-39.9) 10/08/2013  . OA (osteoarthritis) of knee 07/07/2013  . Chronic low back pain 05/09/2013  . Obstructive sleep apnea 05/09/2013  . GERD (gastroesophageal reflux disease) 05/09/2013  . Hiatal hernia 05/09/2013  . Fibromyalgia syndrome 05/09/2013  . Depression, major, in partial remission ([place]) 05/09/2013  . RLS (restless legs syndrome) 05/09/2013  . Diabetes mellitus with no complication ([place]) 94/05/6807  . High cholesterol 05/09/2013  .  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 Family history of colon cancer 05/09/2013  . Allergic rhinitis 05/09/2013  . Lateral meniscal tear 09/04/2012  . CAD (coronary artery disease) 09/27/2011  . HTN (hypertension) 09/27/2011  . Paroxysmal A-fib [facility]) 09/27/2011    Lieutenant Diego PT, DPT 1:51 PM,12/10/18 [place] MAIN [facility] SERVICES [address] [place], [place], [address] Phone: [phone]   Fax:  [phone]  Name: Paelyn Smick MRN: [id number] Date of Birth: Dec 09, 1940

## 2018-12-11 ENCOUNTER — Ambulatory Visit: Payer: Medicare Other

## 2018-12-11 DIAGNOSIS — M25511 Pain in right shoulder: Secondary | ICD-10-CM

## 2018-12-11 DIAGNOSIS — M25611 Stiffness of right shoulder, not elsewhere classified: Secondary | ICD-10-CM

## 2018-12-11 DIAGNOSIS — M6281 Muscle weakness (generalized): Secondary | ICD-10-CM

## 2018-12-11 NOTE — Therapy (Signed)
[place] MAIN [facility] SERVICES [address] [place], [place], [address] Phone: [phone]   Fax:  [phone]  Physical Therapy Treatment  Patient Details  Name: Morgan [name] MRN: [id number] Date of Birth: Apr 10, 1941 Referring Provider (PT): Dr. Mayer Camel   Encounter Date: 12/11/2018  PT End of Session - 12/11/18 1521    Visit Number  16    Number of Visits  33    Date for PT Re-Evaluation  01/22/19    Authorization Type  Last goals: 11/27/18, 09/30/18 (eval)    PT Start Time  1516    PT Stop Time  1600    PT Time Calculation (min)  44 min    Activity Tolerance  Patient limited by pain    Behavior During Therapy  [name] for tasks assessed/performed       Past Medical History:  Diagnosis Date  . Angina   . Arthritis   . Atrial fibrillation (HCC)    ASPIRIN FOR BLOOD THINNER  . Atypical mole 09/22/2016  . Bulging discs    lumbar   . Cancer ([place])    melanoma  . Complication of anesthesia    pt states has choking sensation with ET tube   . Coronary artery disease   . Depression   . Diabetes mellitus    diet controlled/on meds  . Fibromyalgia   . GERD (gastroesophageal reflux disease)   . H/O hiatal hernia   . Headache(784.0)    "recurring"  . High cholesterol   . Hyperlipidemia   . Hypertension   . 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 Migraines    "til ~ 1980"  . PONV (postoperative nausea and vomiting)   . Restless leg syndrome   . Sciatic nerve pain    "from pinched nerve"  . Sleep apnea    uses CPAP  . Weakness of right side of body    "I've had PT for it; they don't know what it's from".  CORTISONE INJECTION INTO BACK 08/30/12    Past Surgical History:  Procedure Laterality Date  . [place] STUDY N/A 12/29/2015   Procedure: [place] STUDY;  Surgeon: Manus Gunning, MD;  Location: [facility];  Service: Gastroenterology;  Laterality: N/A;  . CARPAL TUNNEL RELEASE Left 07/02/2015   Procedure: CARPAL TUNNEL RELEASE;   Surgeon: Frederik Pear, MD;  Location: [facility];  Service: Orthopedics;  Laterality: Left;  . CATARACT EXTRACTION, BILATERAL Bilateral    Oct and Nov 2017  . CORONARY ANGIOPLASTY WITH STENT PLACEMENT  06/2011   "1"  . CORONARY ARTERY BYPASS GRAFT  2005   CABG X 2  . DILATION AND CURETTAGE OF UTERUS     "more than once"  . ELBOW ARTHROSCOPY Left 07/02/2015   Procedure: ARTHROSCOPY LEFT ELBOW WITH DEBRIDEMENT AND REMOVAL LOOSE BODY;  Surgeon: Frederik Pear, MD;  Location: [facility];  Service: Orthopedics;  Laterality: Left;  . ESOPHAGEAL MANOMETRY N/A 12/29/2015   Procedure: ESOPHAGEAL MANOMETRY (EM);  Surgeon: Manus Gunning, MD;  Location: [facility];  Service: Gastroenterology;  Laterality: N/A;  . FRACTURE SURGERY  ~ 2005   nose  . KNEE ARTHROSCOPY  09/04/2012   Procedure: ARTHROSCOPY KNEE;  Surgeon: Gearlean Alf, MD;  Location: [facility];  Service: Orthopedics;  Laterality: Right;  right knee arthroscopy with medial and lateral meniscus debridement  . 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 MOUTH SURGERY  2004   "bone replacement;  had cadavear bones put in; face was collapsing"  . NASAL SEPTUM SURGERY  ~ 1986  . TOTAL KNEE ARTHROPLASTY Right 07/07/2013   Procedure: RIGHT TOTAL KNEE ARTHROPLASTY;  Surgeon: Gearlean Alf, MD;  Location: [facility];  Service: Orthopedics;  Laterality: Right;    There were no vitals filed for this visit.  Subjective Assessment - 12/11/18 1520    Subjective  Pt reports she is doing well today. She was sore after her last session but no reported shoulder pain upon arrival. No specific questions or concerns currently. She is performing her HEP without issue.     Pertinent History  On 08/03/18 pt was carrying a folding table and fell and right arm was under the handle got stuck between the door and the table. She landed on her R side. Tried to get up and had to pull at her arm. Pt had immediate pain in her  right shoulder. She waiteduntil Monday and went to see Dr. Edilia Bo her PCP. He took radiographs but didn't find any fractures. She continued to have pain so she called Dr. Damita Dunnings office, her orthopedist, and saw him that same week. He took additional radiographs and found a hairline fracture in her humeral shaft and a greater tuberosity fracture per patient report. She states that she is not healing well and has recurrent plain film radiographs. She is still wearing a sling and was encouraged not to perform any AROM of her right shoulder. Since her last visit with Dr. Mayer Camel on 09/20/18 she has had an additional fall and landed on her R shoulder. She reports additional bruising and pain in her R shoulder. She has a follow-up appt scheduled with her orthopedist for 10/15/18. 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 ROS negative for red flags.    Limitations  Lifting    Diagnostic tests  Plain film radiographs with decreased healing per pt report    Patient Stated Goals  Improve ability to use RUE and decrease pain    Currently in Pain?  No/denies           TREATMENT   Ther-ex Warm-up on UBE 2 minutes forward/12mnutes backwards for AAROM with therapist monitoring for pain, mild aching but no sharp pain. History obtained; UE ranger for R shoulder flexion x 15, scaption x 15, CW circles x 15, CCW circles x 15; Standing R shoulder AROM flexion and scaption x 10 each with mirror feedback was well as verbal and tactile cues and assist as needed. More limitation noted in scaption and scapular assist as well as passive assist provided intermittently; Wall push up 2 x 10, form corrected and range limited to prevent excessive shoulder extension; Green tband rows with verbal cues for scapular retraction 2 x 10; Shoulder "W's" with slight forward lean from the waist x 10 with extensive verbal and tactile cues about proper technique x 10, pt struggles to perform correctly; Green  tband R shoulder extension 2 x 10; Green tband R shoulder ER 2 x 10; Prone I's, Y's, and T's 2 x 10 each with 3s hold at end range;   Manual Therapy R shoulder PROM flexion, scaption, IR, and ER with end range holds. Pt able to gradually allow for more PROM with cues to relax andrhythmic perturbationsof R shoulder; L sidelying R scapular mobilizations in depression and upward rotation as well as general circumduction to decrease tone;   Trigger Point Dry Needling (TDN), unbilled Education performed with patient regarding  potential benefit of TDN. Reviewed precautions and risks with patient. Reviewed special precautions/risks over lung fields which include pneumothorax. Adequate time spent with pt to ensure full understanding of TDN risks. Pt provided verbal consent to treatment. TDN performed to R upper trap with 2, 0.30 x 50 single needle placements with local twitch response (LTR) during both placements. Pistoning technique utilized. Improved pain-free motion following intervention and improved tissue extensibility;     Pt educated throughout session about proper posture and technique.Improved technique, movement at target joints, after min to mod verbal, visual, tactile cues.    Pt demonstrates good motivation during session. Improving R shoulder AROM against gravity with more range and less pain and decreased compensatory strategies. She continues to struggle most significantly with scaption range of motion. Utilized TDN today to decrease tone and pain in R upper trap. Pt tolerated well without any adverse events.Encouraged pt to follow-up as scheduledand continue HEP.Pt will benefit from PT services to address deficits in strength,mobility, and painin order to return to full function at home.                       PT Short Term Goals - 11/27/18 1006      PT SHORT TERM GOAL #1   Title  Pt will be independent with HEP in order to improve strength and  decrease pain in order to improve pain-free function at home.    Time  4    Period  Weeks    Status  Achieved        PT Long Term Goals - 11/27/18 1006      PT LONG TERM GOAL #1   Title  Pt will decrease quick DASH score by at least 8% in order to demonstrate clinically significant reduction in disability.    Baseline  09/30/18: 84.1%; 10/30/18: 56.8%; 11/27/18: 45.45%    Time  8    Period  Weeks    Status  Achieved      PT LONG TERM GOAL #2   Title  Pt will decrease worst pain as reported on NPRS by at least 3 points in order to demonstrate clinically significant reduction in pain.    Baseline  09/30/18: worst: 9/10; 10/30/18: 6/10;     Time  8    Period  Weeks    Status  Achieved      PT LONG TERM GOAL #3   Title  Pt will improve PROM of R shoulder to within 10 degrees of R shoulder in order to improve functional use of R shoulder for ADLs/IADLs.    Baseline  R/L 129/150 Shoulder flexion, 100/165 Shoulder abduction, 50/93 Shoulder external rotation, 50/70 Shoulder internal rotation; 10/30/18: R/L 140/150 Shoulder flexion, 132/165 Shoulder abduction, 82/93 Shoulder external rotation, 70/70 Shoulder internal rotation    Time  8    Period  Weeks    Status  Partially Met    Target Date  01/22/19      PT LONG TERM GOAL #4   Title  Pt will decrease worst pain as reported on NPRS by at least 3 points (3 or less) in order to demonstrate clinically significant reduction in pain.    Baseline  10/30/18: 6/10; 11/27/18: 4/10    Time  8    Period  Weeks    Status  Partially Met    Target Date  01/22/19      PT LONG TERM GOAL #5   Title  Pt will decrease quick DASH  score by at least 8% in order to demonstrate clinically significant reduction in disability.    Baseline  11/27/18: 45.45%    Time  8    Period  Weeks    Status  New    Target Date  01/22/19            Plan - 12/11/18 1521    Clinical Impression Statement  Pt demonstrates good motivation during session. Improving  R shoulder AROM against gravity with more range and less pain and decreased compensatory strategies. She continues to struggle most significantly with scaption range of motion. Utilized TDN today to decrease tone and pain in R upper trap. Pt tolerated well without any adverse events.Encouraged pt to follow-up as scheduledand continue HEP.Pt will benefit from PT services to address deficits in strength,mobility, and painin order to return to full function at home.    Rehab Potential  Good    PT Frequency  2x / week    PT Duration  8 weeks    PT Treatment/Interventions  ADLs/Self Care Home Management;Aquatic Therapy;Canalith Repostioning;Cryotherapy;Electrical Stimulation;Iontophoresis '4mg'$ /ml Dexamethasone;Moist Heat;Traction;Ultrasound;Contrast Bath;DME Instruction;Gait training;Stair training;Functional mobility training;Therapeutic activities;Therapeutic exercise;Balance training;Neuromuscular re-education;Patient/family education;Manual techniques;Passive range of motion;Dry needling;Splinting;Taping;Vestibular    PT Next Visit Plan  Review HEP and progress as needed, Progress AAROM/AROM/strengthening of shoulder as tolerated without pain    PT Home Exercise Plan  Cervical retractions, Scapular retractions, R upper trap stretch, supine canes for AAROM flexion with hands together progressing to AROM supine flexion, AROM sidelying shoulder abduction, AROM sidelying shoulder ER    Consulted and Agree with Plan of Care  Patient       Patient will benefit from skilled therapeutic intervention in order to improve the following deficits and impairments:  Decreased strength, Decreased range of motion, Impaired UE functional use, Pain  Visit Diagnosis: Acute pain of right shoulder  Stiffness of right shoulder, not elsewhere classified  Muscle weakness (generalized)     Problem List Patient Active Problem List   Diagnosis Date Noted  . Malignant melanoma ([place]) 11/23/2016  . Urge incontinence  11/23/2016  . Atypical mole 09/22/2016  . Slurring of speech 05/12/2016  . Traumatic arthritis elbow 05/04/2015  . Left foot pain 12/31/2014  . Severe sprain of left ankle 12/31/2014  . Osteopenia 09/15/2014  . History of TIA (transient ischemic attack) 05/19/2014  . Fracture of coronoid process of ulna, left, closed 04/25/2014  . Ingrown toenail 01/15/2014  . Obesity (BMI 30-39.9) 10/08/2013  . OA (osteoarthritis) of knee 07/07/2013  . Chronic low back pain 05/09/2013  . Obstructive sleep apnea 05/09/2013  . GERD (gastroesophageal reflux disease) 05/09/2013  . Hiatal hernia 05/09/2013  . Fibromyalgia syndrome 05/09/2013  . Depression, major, in partial remission ([place]) 05/09/2013  . RLS (restless legs syndrome) 05/09/2013  . Diabetes mellitus with no complication ([place]) 85/46/2703  . High cholesterol 05/09/2013  . 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 Family history of colon cancer 05/09/2013  . Allergic rhinitis 05/09/2013  . Lateral meniscal tear 09/04/2012  . CAD (coronary artery disease) 09/27/2011  . HTN (hypertension) 09/27/2011  . Paroxysmal A-fib ([place]) 09/27/2011   Phillips Grout PT, DPT, GCS  Najiyah Paris 12/12/2018, 10:08 AM  [place] MAIN [facility] SERVICES [address] [place], [place], [address] Phone: [phone]   Fax:  [phone]  Name: Morgan [name] MRN: [id number] Date of Birth: [birth year]

## 2018-12-16 ENCOUNTER — Ambulatory Visit: Payer: Medicare Other | Attending: Orthopedic Surgery

## 2018-12-16 DIAGNOSIS — M6281 Muscle weakness (generalized): Secondary | ICD-10-CM | POA: Diagnosis present

## 2018-12-16 DIAGNOSIS — M25511 Pain in right shoulder: Secondary | ICD-10-CM | POA: Diagnosis not present

## 2018-12-16 DIAGNOSIS — M25611 Stiffness of right shoulder, not elsewhere classified: Secondary | ICD-10-CM

## 2018-12-16 NOTE — Therapy (Signed)
[place] MAIN [facility] SERVICES [address] [place], [place], [address] Phone: [phone]   Fax:  [phone]  Physical Therapy Treatment  Patient Details  Name: Morgan [name] MRN: [id number] Date of Birth: 07-12-1941 Referring Provider (PT): Dr. Mayer Camel   Encounter Date: 12/16/2018  PT End of Session - 12/16/18 1243    Visit Number  17    Number of Visits  33    Date for PT Re-Evaluation  01/22/19    Authorization Type  Last goals: 11/27/18, 09/30/18 (eval)    PT Start Time  0937    PT Stop Time  1020    PT Time Calculation (min)  43 min    Activity Tolerance  Patient limited by pain    Behavior During Therapy  [facility] for tasks assessed/performed       Past Medical History:  Diagnosis Date  . Angina   . Arthritis   . Atrial fibrillation (HCC)    ASPIRIN FOR BLOOD THINNER  . Atypical mole 09/22/2016  . Bulging discs    lumbar   . Cancer ([place])    melanoma  . Complication of anesthesia    pt states has choking sensation with ET tube   . Coronary artery disease   . Depression   . Diabetes mellitus    diet controlled/on meds  . Fibromyalgia   . GERD (gastroesophageal reflux disease)   . H/O hiatal hernia   . Headache(784.0)    "recurring"  . High cholesterol   . Hyperlipidemia   . Hypertension   . 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 Migraines    "til ~ 1980"  . PONV (postoperative nausea and vomiting)   . Restless leg syndrome   . Sciatic nerve pain    "from pinched nerve"  . Sleep apnea    uses CPAP  . Weakness of right side of body    "I've had PT for it; they don't know what it's from".  CORTISONE INJECTION INTO BACK 08/30/12    Past Surgical History:  Procedure Laterality Date  . [place] STUDY N/A 12/29/2015   Procedure: [place] STUDY;  Surgeon: Manus Gunning, MD;  Location: [facility];  Service: Gastroenterology;  Laterality: N/A;  . CARPAL TUNNEL RELEASE Left 07/02/2015   Procedure: CARPAL TUNNEL RELEASE;   Surgeon: Frederik Pear, MD;  Location: [place];  Service: Orthopedics;  Laterality: Left;  . CATARACT EXTRACTION, BILATERAL Bilateral    Oct and Nov 2017  . CORONARY ANGIOPLASTY WITH STENT PLACEMENT  06/2011   "1"  . CORONARY ARTERY BYPASS GRAFT  2005   CABG X 2  . DILATION AND CURETTAGE OF UTERUS     "more than once"  . ELBOW ARTHROSCOPY Left 07/02/2015   Procedure: ARTHROSCOPY LEFT ELBOW WITH DEBRIDEMENT AND REMOVAL LOOSE BODY;  Surgeon: Frederik Pear, MD;  Location: [place];  Service: Orthopedics;  Laterality: Left;  . ESOPHAGEAL MANOMETRY N/A 12/29/2015   Procedure: ESOPHAGEAL MANOMETRY (EM);  Surgeon: Manus Gunning, MD;  Location: [facility];  Service: Gastroenterology;  Laterality: N/A;  . FRACTURE SURGERY  ~ 2005   nose  . KNEE ARTHROSCOPY  09/04/2012   Procedure: ARTHROSCOPY KNEE;  Surgeon: Gearlean Alf, MD;  Location: [facility];  Service: Orthopedics;  Laterality: Right;  right knee arthroscopy with medial and lateral meniscus debridement  . 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 MOUTH SURGERY  2004   "bone replacement;  had cadavear bones put in; face was collapsing"  . NASAL SEPTUM SURGERY  ~ 1986  . TOTAL KNEE ARTHROPLASTY Right 07/07/2013   Procedure: RIGHT TOTAL KNEE ARTHROPLASTY;  Surgeon: Gearlean Alf, MD;  Location: [facility];  Service: Orthopedics;  Laterality: Right;    There were no vitals filed for this visit.  Subjective Assessment - 12/16/18 0944    Subjective  Pt reports she is doing well today. She was sore after her last session which lasted for a few days. No specific questions or concerns currently. She is performing her HEP without issue.     Pertinent History  On 08/03/18 pt was carrying a folding table and fell and right arm was under the handle got stuck between the door and the table. She landed on her R side. Tried to get up and had to pull at her arm. Pt had immediate pain in her right shoulder.  She waiteduntil Monday and went to see Dr. Edilia Bo her PCP. He took radiographs but didn't find any fractures. She continued to have pain so she called Dr. Damita Dunnings office, her orthopedist, and saw him that same week. He took additional radiographs and found a hairline fracture in her humeral shaft and a greater tuberosity fracture per patient report. She states that she is not healing well and has recurrent plain film radiographs. She is still wearing a sling and was encouraged not to perform any AROM of her right shoulder. Since her last visit with Dr. Mayer Camel on 09/20/18 she has had an additional fall and landed on her R shoulder. She reports additional bruising and pain in her R shoulder. She has a follow-up appt scheduled with her orthopedist for 10/15/18. 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 ROS negative for red flags.    Limitations  Lifting    Diagnostic tests  Plain film radiographs with decreased healing per pt report    Patient Stated Goals  Improve ability to use RUE and decrease pain    Currently in Pain?  No/denies           TREATMENT   Ther-ex Warm-up on UBE 2 minutes forward/3mnutes backwards for AAROM with therapist monitoring for pain, mild aching but no sharp pain. History obtained; UE ranger for R shoulder flexion x 15, scaption x 15, CW circles x 15, CCW circles x 15; Standing R shoulder AROM flexion and scaption x 10 each with mirror feedback was well as verbal and tactile cues and assist as needed. More limitation noted in scaption and scapular assist as well as passive assist provided intermittently; Wall push up 2 x 10, form corrected and range limited to prevent excessive shoulder extension; Green tband rows with verbal cues for scapular retraction x 10; Green tband R shoulder extension 2 x 10; L sidelying R shoulder ER with 1# DB 2 x 10, challenging for the patient;   Manual Therapy Supine R shoulder PROM flexion, scaption, and  ER with end range holds. Pt able to gradually allow for more PROM with cues to relax; Supine R shoulder inferior mobilizations at available end range scaption, grade III, 30s/bout x 3 bouts; Supine R shoulder posterior mobilizations at 90 pressing through elbow, grade III, 30s/bout x 1 bout; Attempted cross body posterior capsule stretch with increase in pain so discontinued;   Trigger Point Dry Needling (TDN), unbilled Education performed with patient regarding potential benefit of TDN. Reviewed precautions and risks with patient. Reviewed special precautions/risks over lung  fields which include pneumothorax. Adequate time spent with pt to ensure full understanding of TDN risks. Pt provided verbal consent to treatment. TDN performed to R upper trap with 1, 0.30 x 50 single needle placement with local twitch response (LTR) during placement. Pistoning technique utilized but less aggressive than during last session with longer static placement utilized. Improved pain-free motion following intervention and improved tissue extensibility;     Pt educated throughout session about proper posture and technique.Improved technique, movement at target joints, after min to mod verbal, visual, tactile cues.   Pt demonstrates good motivation during session.Improving R shoulder AROM against gravity with more range and less pain but she continues to struggle most significantly with scaption range of motion. Sidelying resisted ER is challenging for patient. She reports reduction in upper trap pain following last session so utilized TDN again today. Will consider issuing pulleys at next session or adding additional scaption passive stretches.Encouraged pt to follow-up as scheduledand continue HEP.Pt will benefit from PT services to address deficits in strength,mobility, and painin order to return to full function at home.                       PT Short Term Goals - 11/27/18 1006       PT SHORT TERM GOAL #1   Title  Pt will be independent with HEP in order to improve strength and decrease pain in order to improve pain-free function at home.    Time  4    Period  Weeks    Status  Achieved        PT Long Term Goals - 11/27/18 1006      PT LONG TERM GOAL #1   Title  Pt will decrease quick DASH score by at least 8% in order to demonstrate clinically significant reduction in disability.    Baseline  09/30/18: 84.1%; 10/30/18: 56.8%; 11/27/18: 45.45%    Time  8    Period  Weeks    Status  Achieved      PT LONG TERM GOAL #2   Title  Pt will decrease worst pain as reported on NPRS by at least 3 points in order to demonstrate clinically significant reduction in pain.    Baseline  09/30/18: worst: 9/10; 10/30/18: 6/10;     Time  8    Period  Weeks    Status  Achieved      PT LONG TERM GOAL #3   Title  Pt will improve PROM of R shoulder to within 10 degrees of R shoulder in order to improve functional use of R shoulder for ADLs/IADLs.    Baseline  R/L 129/150 Shoulder flexion, 100/165 Shoulder abduction, 50/93 Shoulder external rotation, 50/70 Shoulder internal rotation; 10/30/18: R/L 140/150 Shoulder flexion, 132/165 Shoulder abduction, 82/93 Shoulder external rotation, 70/70 Shoulder internal rotation    Time  8    Period  Weeks    Status  Partially Met    Target Date  01/22/19      PT LONG TERM GOAL #4   Title  Pt will decrease worst pain as reported on NPRS by at least 3 points (3 or less) in order to demonstrate clinically significant reduction in pain.    Baseline  10/30/18: 6/10; 11/27/18: 4/10    Time  8    Period  Weeks    Status  Partially Met    Target Date  01/22/19      PT LONG TERM GOAL #5  Title  Pt will decrease quick DASH score by at least 8% in order to demonstrate clinically significant reduction in disability.    Baseline  11/27/18: 45.45%    Time  8    Period  Weeks    Status  New    Target Date  01/22/19            Plan -  12/16/18 1244    Clinical Impression Statement  Pt demonstrates good motivation during session.Improving R shoulder AROM against gravity with more range and less pain but she continues to struggle most significantly with scaption range of motion. Sidelying resisted ER is challenging for patient. She reports reduction in upper trap pain following last session so utilized TDN again today. Will consider issuing pulleys at next session or adding additional scaption passive stretches.Encouraged pt to follow-up as scheduledand continue HEP.Pt will benefit from PT services to address deficits in strength,mobility, and painin order to return to full function at home.    Clinical Presentation  Unstable    Clinical Decision Making  Low    Rehab Potential  Good    PT Frequency  2x / week    PT Duration  8 weeks    PT Treatment/Interventions  ADLs/Self Care Home Management;Aquatic Therapy;Canalith Repostioning;Cryotherapy;Electrical Stimulation;Iontophoresis 99m/ml Dexamethasone;Moist Heat;Traction;Ultrasound;Contrast Bath;DME Instruction;Gait training;Stair training;Functional mobility training;Therapeutic activities;Therapeutic exercise;Balance training;Neuromuscular re-education;Patient/family education;Manual techniques;Passive range of motion;Dry needling;Splinting;Taping;Vestibular    PT Next Visit Plan  Review HEP and progress as needed, consider adding pulleys, Progress AAROM/AROM/strengthening of shoulder as tolerated without pain    PT Home Exercise Plan  Cervical retractions, Scapular retractions, R upper trap stretch, supine canes for AAROM flexion with hands together progressing to AROM supine flexion, AROM sidelying shoulder abduction, AROM sidelying shoulder ER    Consulted and Agree with Plan of Care  Patient       Patient will benefit from skilled therapeutic intervention in order to improve the following deficits and impairments:  Decreased strength, Decreased range of motion, Impaired UE  functional use, Pain  Visit Diagnosis: Acute pain of right shoulder  Stiffness of right shoulder, not elsewhere classified  Muscle weakness (generalized)     Problem List Patient Active Problem List   Diagnosis Date Noted  . Malignant melanoma ([place] 11/23/2016  . Urge incontinence 11/23/2016  . Atypical mole 09/22/2016  . Slurring of speech 05/12/2016  . Traumatic arthritis elbow 05/04/2015  . Left foot pain 12/31/2014  . Severe sprain of left ankle 12/31/2014  . Osteopenia 09/15/2014  . History of TIA (transient ischemic attack) 05/19/2014  . Fracture of coronoid process of ulna, left, closed 04/25/2014  . Ingrown toenail 01/15/2014  . Obesity (BMI 30-39.9) 10/08/2013  . OA (osteoarthritis) of knee 07/07/2013  . Chronic low back pain 05/09/2013  . Obstructive sleep apnea 05/09/2013  . GERD (gastroesophageal reflux disease) 05/09/2013  . Hiatal hernia 05/09/2013  . Fibromyalgia syndrome 05/09/2013  . Depression, major, in partial remission ([place] 05/09/2013  . RLS (restless legs syndrome) 05/09/2013  . Diabetes mellitus with no complication ([place] 061/44/3154 . High cholesterol 05/09/2013  . 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 Family history of colon cancer 05/09/2013  . Allergic rhinitis 05/09/2013  . Lateral meniscal tear 09/04/2012  . CAD (coronary artery disease) 09/27/2011  . HTN (hypertension) 09/27/2011  . Paroxysmal A-fib ([place] 09/27/2011   JPhillips GroutPT, DPT, GCS  Eidan Muellner 12/16/2018, 1:02 PM  [place] [facility] [address] [address]Phone: [phone]  Fax:  [phone]  Name: Morgan [name] MRN: [id number] Date of Birth: 02-18-1941

## 2018-12-18 ENCOUNTER — Ambulatory Visit: Payer: Medicare Other

## 2018-12-18 ENCOUNTER — Telehealth: Payer: Self-pay | Admitting: Family Medicine

## 2018-12-18 VITALS — BP 138/54 | HR 70

## 2018-12-18 DIAGNOSIS — E7800 Pure hypercholesterolemia, unspecified: Secondary | ICD-10-CM

## 2018-12-18 DIAGNOSIS — E119 Type 2 diabetes mellitus without complications: Secondary | ICD-10-CM

## 2018-12-18 DIAGNOSIS — M25611 Stiffness of right shoulder, not elsewhere classified: Secondary | ICD-10-CM

## 2018-12-18 DIAGNOSIS — M25511 Pain in right shoulder: Secondary | ICD-10-CM | POA: Diagnosis not present

## 2018-12-18 DIAGNOSIS — M8580 Other specified disorders of bone density and structure, unspecified site: Secondary | ICD-10-CM

## 2018-12-18 DIAGNOSIS — M6281 Muscle weakness (generalized): Secondary | ICD-10-CM

## 2018-12-18 NOTE — Therapy (Signed)
[place] MAIN [facility] SERVICES [address] [place], [place], [address] Phone: [phone]   Fax:  [phone]  Physical Therapy Treatment  Patient Details  Name: Morgan [name] MRN: [id number] Date of Birth: Mar 14, 1941 Referring Provider (PT): Dr. Mayer Camel   Encounter Date: 12/18/2018  PT End of Session - 12/18/18 0956    Visit Number  18    Number of Visits  33    Date for PT Re-Evaluation  01/22/19    Authorization Type  Last goals: 11/27/18, 09/30/18 (eval)    PT Start Time  0930    PT Stop Time  1015    PT Time Calculation (min)  45 min    Activity Tolerance  Patient limited by pain    Behavior During Therapy  [facility] for tasks assessed/performed       Past Medical History:  Diagnosis Date  . Angina   . Arthritis   . Atrial fibrillation (HCC)    ASPIRIN FOR BLOOD THINNER  . Atypical mole 09/22/2016  . Bulging discs    lumbar   . Cancer ([place])    melanoma  . Complication of anesthesia    pt states has choking sensation with ET tube   . Coronary artery disease   . Depression   . Diabetes mellitus    diet controlled/on meds  . Fibromyalgia   . GERD (gastroesophageal reflux disease)   . H/O hiatal hernia   . Headache(784.0)    "recurring"  . High cholesterol   . Hyperlipidemia   . Hypertension   . 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 Migraines    "til ~ 1980"  . PONV (postoperative nausea and vomiting)   . Restless leg syndrome   . Sciatic nerve pain    "from pinched nerve"  . Sleep apnea    uses CPAP  . Weakness of right side of body    "I've had PT for it; they don't know what it's from".  CORTISONE INJECTION INTO BACK 08/30/12    Past Surgical History:  Procedure Laterality Date  . [place] STUDY N/A 12/29/2015   Procedure: [place] STUDY;  Surgeon: Manus Gunning, MD;  Location: [facility];  Service: Gastroenterology;  Laterality: N/A;  . CARPAL TUNNEL RELEASE Left 07/02/2015   Procedure: CARPAL TUNNEL RELEASE;   Surgeon: Frederik Pear, MD;  Location: [place];  Service: Orthopedics;  Laterality: Left;  . CATARACT EXTRACTION, BILATERAL Bilateral    Oct and Nov 2017  . CORONARY ANGIOPLASTY WITH STENT PLACEMENT  06/2011   "1"  . CORONARY ARTERY BYPASS GRAFT  2005   CABG X 2  . DILATION AND CURETTAGE OF UTERUS     "more than once"  . ELBOW ARTHROSCOPY Left 07/02/2015   Procedure: ARTHROSCOPY LEFT ELBOW WITH DEBRIDEMENT AND REMOVAL LOOSE BODY;  Surgeon: Frederik Pear, MD;  Location: [place];  Service: Orthopedics;  Laterality: Left;  . ESOPHAGEAL MANOMETRY N/A 12/29/2015   Procedure: ESOPHAGEAL MANOMETRY (EM);  Surgeon: Manus Gunning, MD;  Location: [facility];  Service: Gastroenterology;  Laterality: N/A;  . FRACTURE SURGERY  ~ 2005   nose  . KNEE ARTHROSCOPY  09/04/2012   Procedure: ARTHROSCOPY KNEE;  Surgeon: Gearlean Alf, MD;  Location: [facility];  Service: Orthopedics;  Laterality: Right;  right knee arthroscopy with medial and lateral meniscus debridement  . 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 MOUTH SURGERY  2004   "bone replacement;  had cadavear bones put in; face was collapsing"  . NASAL SEPTUM SURGERY  ~ 1986  . TOTAL KNEE ARTHROPLASTY Right 07/07/2013   Procedure: RIGHT TOTAL KNEE ARTHROPLASTY;  Surgeon: Frank V Aluisio, MD;  Location: [facility];  Service: Orthopedics;  Laterality: Right;    Vitals:   12/18/18 0936  BP: (!) 138/54  Pulse: 70  SpO2: 99%    Subjective Assessment - 12/18/18 0935    Subjective  Pt reports she is doing well today. She notes improved pain in R upper trap following TDN last session however it was very sore in the middle of the night last night. She is performing her HEP without issue.     Pertinent History  On 08/03/18 pt was carrying a folding table and fell and right arm was under the handle got stuck between the door and the table. She landed on her R side. Tried to get up and had to pull at her  arm. Pt had immediate pain in her right shoulder. She waiteduntil Monday and went to see Dr. Copeland her PCP. He took radiographs but didn't find any fractures. She continued to have pain so she called Dr. Rowan's office, her orthopedist, and saw him that same week. He took additional radiographs and found a hairline fracture in her humeral shaft and a greater tuberosity fracture per patient report. She states that she is not healing well and has recurrent plain film radiographs. She is still wearing a sling and was encouraged not to perform any AROM of her right shoulder. Since her last visit with Dr. Rowan on 09/20/18 she has had an additional fall and landed on her R shoulder. She reports additional bruising and pain in her R shoulder. She has a follow-up appt scheduled with her orthopedist for 10/15/18. 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 ROS negative for red flags.    Limitations  Lifting    Diagnostic tests  Plain film radiographs with decreased healing per pt report    Patient Stated Goals  Improve ability to use RUE and decrease pain    Currently in Pain?  No/denies           TREATMENT   Ther-ex Warm-up on UBE 2 minutes forward/2minutes backwards for AAROM with therapist monitoring for pain, mild aching but no sharp pain. History obtained;  Seated pulleys for flexion and scaption with eduction/instruction, 5s hold at end range x 15 each direction; Standing R shoulder AROM flexion and scaption x 10 each with tactile and verbal feedback. More limitation noted in scaptionand scapular assist as well as passive assist providedintermittently; [place] rows with verbal cues for scapular retraction x 10; [place] R shoulder extension x 10; Supine R shoulder flexion with 1# weight to available end range x 10; L sidelying R shoulder abduction with 1# weight to available end range x 10; L sidelying R shoulder ER with 1# DB 2 x 10, challenging for the  patient;   Manual Therapy Supine R AC joint inferior mobilization grade I-II, 30s/bout x 3 bouts, limited grade due to increase in pain; Supine R [place] joint inferior mobilization grade I-II, 30s/bout x 2 bouts, limited grade due to increase in pain; Supine R shoulder inferior mobilizations at available end range scaption, grade III, 30s/bout x 3 bouts; Supine R shoulder posterior mobilizations at 90 flexionpressing through elbow, grade III, 30s/bout x 1 bout; Gentle STM to R upper trap utilizing effleurage and trigger point release      Pt educated throughout session about proper posture and technique.Improved technique, movement at target joints, after min to mod verbal, visual, tactile cues.   Pt demonstrates good motivation during session.She demonstrates improved scaption with pulleys so added pulley flexion and scaption AAROM to HEP. She rreports reduction in upper trap pain following last session but soreness last night so avoided TDN today and instead utilized effleurage and trigger point release to R upper trap.Encouraged pt to follow-up as scheduledand continue HEP.Pt will benefit from PT services to address deficits in strength,mobility, and painin order to return to full function at home.                       PT Short Term Goals - 11/27/18 1006      PT SHORT TERM GOAL #1   Title  Pt will be independent with HEP in order to improve strength and decrease pain in order to improve pain-free function at home.    Time  4    Period  Weeks    Status  Achieved        PT Long Term Goals - 11/27/18 1006      PT LONG TERM GOAL #1   Title  Pt will decrease quick DASH score by at least 8% in order to demonstrate clinically significant reduction in disability.    Baseline  09/30/18: 84.1%; 10/30/18: 56.8%; 11/27/18: 45.45%    Time  8    Period  Weeks    Status  Achieved      PT LONG TERM GOAL #2   Title  Pt will decrease worst pain as reported on NPRS  by at least 3 points in order to demonstrate clinically significant reduction in pain.    Baseline  09/30/18: worst: 9/10; 10/30/18: 6/10;     Time  8    Period  Weeks    Status  Achieved      PT LONG TERM GOAL #3   Title  Pt will improve PROM of R shoulder to within 10 degrees of R shoulder in order to improve functional use of R shoulder for ADLs/IADLs.    Baseline  R/L 129/150 Shoulder flexion, 100/165 Shoulder abduction, 50/93 Shoulder external rotation, 50/70 Shoulder internal rotation; 10/30/18: R/L 140/150 Shoulder flexion, 132/165 Shoulder abduction, 82/93 Shoulder external rotation, 70/70 Shoulder internal rotation    Time  8    Period  Weeks    Status  Partially Met    Target Date  01/22/19      PT LONG TERM GOAL #4   Title  Pt will decrease worst pain as reported on NPRS by at least 3 points (3 or less) in order to demonstrate clinically significant reduction in pain.    Baseline  10/30/18: 6/10; 11/27/18: 4/10    Time  8    Period  Weeks    Status  Partially Met    Target Date  01/22/19      PT LONG TERM GOAL #5   Title  Pt will decrease quick DASH score by at least 8% in order to demonstrate clinically significant reduction in disability.    Baseline  11/27/18: 45.45%    Time  8    Period  Weeks    Status  New    Target Date  01/22/19            Plan - 12/18/18 0957    Clinical Impression Statement  Pt demonstrates good motivation during session.She demonstrates improved scaption with   pulleys so added pulley flexion and scaption AAROM to HEP. She rreports reduction in upper trap pain following last session but soreness last night so avoided TDN today and instead utilized effleurage and trigger point release to R upper trap.Encouraged pt to follow-up as scheduledand continue HEP.Pt will benefit from PT services to address deficits in strength,mobility, and painin order to return to full function at home.    Rehab Potential  Good    PT Frequency  2x / week     PT Duration  8 weeks    PT Treatment/Interventions  ADLs/Self Care Home Management;Aquatic Therapy;Canalith Repostioning;Cryotherapy;Electrical Stimulation;Iontophoresis 4mg/ml Dexamethasone;Moist Heat;Traction;Ultrasound;Contrast Bath;DME Instruction;Gait training;Stair training;Functional mobility training;Therapeutic activities;Therapeutic exercise;Balance training;Neuromuscular re-education;Patient/family education;Manual techniques;Passive range of motion;Dry needling;Splinting;Taping;Vestibular    PT Next Visit Plan  Review HEP and progress as needed,, Progress AAROM/AROM/strengthening of shoulder as tolerated without pain    PT Home Exercise Plan  Cervical retractions, Scapular retractions, R upper trap stretch, supine canes for AAROM flexion with hands together progressing to AROM supine flexion, AROM sidelying shoulder abduction, AROM sidelying shoulder ER, seated pulleys for flexion and scaption ([id number])    Consulted and Agree with Plan of Care  Patient       Patient will benefit from skilled therapeutic intervention in order to improve the following deficits and impairments:  Decreased strength, Decreased range of motion, Impaired UE functional use, Pain  Visit Diagnosis: Acute pain of right shoulder  Stiffness of right shoulder, not elsewhere classified  Muscle weakness (generalized)     Problem List Patient Active Problem List   Diagnosis Date Noted  . Malignant melanoma (HCC) 11/23/2016  . Urge incontinence 11/23/2016  . Atypical mole 09/22/2016  . Slurring of speech 05/12/2016  . Traumatic arthritis elbow 05/04/2015  . Left foot pain 12/31/2014  . Severe sprain of left ankle 12/31/2014  . Osteopenia 09/15/2014  . History of TIA (transient ischemic attack) 05/19/2014  . Fracture of coronoid process of ulna, left, closed 04/25/2014  . Ingrown toenail 01/15/2014  . Obesity (BMI 30-39.9) 10/08/2013  . OA (osteoarthritis) of knee 07/07/2013  . Chronic low back pain  05/09/2013  . Obstructive sleep apnea 05/09/2013  . GERD (gastroesophageal reflux disease) 05/09/2013  . Hiatal hernia 05/09/2013  . Fibromyalgia syndrome 05/09/2013  . Depression, major, in partial remission (HCC) 05/09/2013  . RLS (restless legs syndrome) 05/09/2013  . Diabetes mellitus with no complication (HCC) 05/09/2013  . High cholesterol 05/09/2013  . 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 Family history of colon cancer 05/09/2013  . Allergic rhinitis 05/09/2013  . Lateral meniscal tear 09/04/2012  . CAD (coronary artery disease) 09/27/2011  . HTN (hypertension) 09/27/2011  . Paroxysmal A-fib (HCC) 09/27/2011   Jason D Huprich PT, DPT, GCS  Huprich,Jason 12/18/2018, 3:13 PM  [facility] [address] [place], [place], [address] Phone: [phone]   Fax:  [phone]  Name: Morgan [name] MRN: [id number] Date of Birth: 02/26/1941   

## 2018-12-18 NOTE — Telephone Encounter (Signed)
-----   Message from Eustace Pen, LPN sent at 1/76/1607  3:46 PM EST ----- Regarding: Labs 2/6 Lab orders needed. Thank you.

## 2018-12-18 NOTE — Patient Instructions (Signed)
Access Code: [email or web address]  URL: [email or web address]  Date: 12/18/2018  Prepared by: Roxana Hires   Exercises  Seated Shoulder Flexion AAROM with Pulley in Front - 10 reps - 2 sets - 3 seconds hold - 1x daily - 7x weekly  Seated Shoulder Scaption AAROM with Pulley at Side - 10 reps - 2 sets - 3 seconds hold - 1x daily - 7x weekly

## 2018-12-19 ENCOUNTER — Ambulatory Visit: Payer: Medicare Other

## 2018-12-19 ENCOUNTER — Other Ambulatory Visit: Payer: Self-pay | Admitting: Family Medicine

## 2018-12-23 ENCOUNTER — Ambulatory Visit: Payer: Medicare Other

## 2018-12-23 ENCOUNTER — Ambulatory Visit (INDEPENDENT_AMBULATORY_CARE_PROVIDER_SITE_OTHER): Payer: Medicare Other

## 2018-12-23 VITALS — BP 124/80 | HR 64 | Temp 98.0°F | Ht 62.25 in | Wt 152.0 lb

## 2018-12-23 DIAGNOSIS — M25511 Pain in right shoulder: Secondary | ICD-10-CM

## 2018-12-23 DIAGNOSIS — E119 Type 2 diabetes mellitus without complications: Secondary | ICD-10-CM | POA: Diagnosis not present

## 2018-12-23 DIAGNOSIS — Z0000 Encounter for general adult medical examination without abnormal findings: Secondary | ICD-10-CM | POA: Diagnosis not present

## 2018-12-23 DIAGNOSIS — M6281 Muscle weakness (generalized): Secondary | ICD-10-CM

## 2018-12-23 DIAGNOSIS — M8580 Other specified disorders of bone density and structure, unspecified site: Secondary | ICD-10-CM | POA: Diagnosis not present

## 2018-12-23 DIAGNOSIS — E7800 Pure hypercholesterolemia, unspecified: Secondary | ICD-10-CM

## 2018-12-23 DIAGNOSIS — E2839 Other primary ovarian failure: Secondary | ICD-10-CM

## 2018-12-23 DIAGNOSIS — M25611 Stiffness of right shoulder, not elsewhere classified: Secondary | ICD-10-CM

## 2018-12-23 LAB — COMPREHENSIVE METABOLIC PANEL
ALBUMIN: 4.1 g/dL (ref 3.5–5.2)
ALK PHOS: 77 U/L (ref 39–117)
ALT: 19 U/L (ref 0–35)
AST: 19 U/L (ref 0–37)
BILIRUBIN TOTAL: 0.6 mg/dL (ref 0.2–1.2)
BUN: 21 mg/dL (ref 6–23)
CO2: 30 mEq/L (ref 19–32)
Calcium: 9.7 mg/dL (ref 8.4–10.5)
Chloride: 104 mEq/L (ref 96–112)
Creatinine, Ser: 0.81 mg/dL (ref 0.40–1.20)
GFR: 68.61 mL/min (ref >60.00)
Glucose, Bld: 89 mg/dL (ref 70–99)
Potassium: 4.3 mEq/L (ref 3.5–5.1)
Sodium: 143 mEq/L (ref 135–145)
TOTAL PROTEIN: 6.4 g/dL (ref 6.0–8.3)

## 2018-12-23 LAB — MICROALBUMIN / CREATININE URINE RATIO
Creatinine,U: 147.9 mg/dL
Microalb Creat Ratio: 7.3 mg/g (ref 0.0–30.0)
Microalb, Ur: 10.8 mg/dL — ABNORMAL HIGH (ref 0.0–1.9)

## 2018-12-23 LAB — LIPID PANEL
Cholesterol: 110 mg/dL (ref 0–200)
HDL: 45.6 mg/dL (ref >39.00)
LDL Cholesterol: 50 mg/dL (ref 0–99)
NonHDL: 64.34
Total CHOL/HDL Ratio: 2
Triglycerides: 72 mg/dL (ref 0.0–149.0)
VLDL: 14.4 mg/dL (ref 0.0–40.0)

## 2018-12-23 LAB — VITAMIN D 25 HYDROXY (VIT D DEFICIENCY, FRACTURES): VITD: 53.07 ng/mL (ref 30.00–100.00)

## 2018-12-23 LAB — HEMOGLOBIN A1C: Hgb A1c MFr Bld: 6.3 % (ref 4.6–6.5)

## 2018-12-23 NOTE — Progress Notes (Signed)
Subjective:   Morgan [name] is a 78 y.o. female who presents for Medicare Annual (Subsequent) preventive examination.  Review of Systems: N/A Cardiac Risk Factors include: advanced age (>20men, >37 women);diabetes mellitus;dyslipidemia;hypertension     Objective:     Vitals: BP 124/80 (BP Location: Left Arm, Patient Position: Sitting, Cuff Size: Normal)   Pulse 64   Temp 98 F (36.7 C) (Oral)   Ht 5' 2.25" (1.581 m) Comment: no shoes  Wt 152 lb (68.9 kg)   SpO2 98%   BMI 27.58 kg/m   Body mass index is 27.58 kg/m.  Advanced Directives 12/23/2018 09/30/2018 12/07/2017 06/04/2017 03/21/2017 11/14/2016 12/01/2015  Does Patient Have a Medical Advance Directive? Yes Yes Yes Yes Yes Yes Yes  Type of Paramedic of [place];Living will [place];Living will [place];Living will [place];Living will Living will [place];Living will [place];Living will;Advance instruction for mental health treatment  Does patient want to make changes to medical advance directive? - - - No - Patient declined - - No - Patient declined  Copy of [place] in Chart? Yes - validated most recent copy scanned in chart (See row information) - No - copy requested Yes - No - copy requested (No Data)  Pre-existing out of facility DNR order (yellow form or pink MOST form) - - - - - - -    Tobacco Social History   Tobacco Use  Smoking Status Never Smoker  Smokeless Tobacco Never Used     Counseling given: No   Clinical Intake:  Pre-visit preparation completed: Yes  Pain Score: 4  Pain Type: Chronic pain Pain Location: Shoulder Pain Orientation: Right     Nutritional Status: BMI 25 -29 Overweight Nutritional Risks: None Diabetes: Yes CBG done?: No Did pt. bring in CBG monitor from home?: No  How often do you need to have someone help you when you read  instructions, pamphlets, or other written materials from your doctor or pharmacy?: 1 - Never What is the last grade level you completed in school?: 12th grade + 1 yr college  Interpreter Needed?: No  Comments: pt lives with spouse Information entered by :: LPinson, LPN  Past Medical History:  Diagnosis Date  . Angina   . Arthritis   . Atrial fibrillation (HCC)    ASPIRIN FOR BLOOD THINNER  . Atypical mole 09/22/2016  . Bulging discs    lumbar   . Cancer ([place])    melanoma  . Complication of anesthesia    pt states has choking sensation with ET tube   . Coronary artery disease   . Depression   . Diabetes mellitus    diet controlled/on meds  . Fibromyalgia   . GERD (gastroesophageal reflux disease)   . H/O hiatal hernia   . Headache(784.0)    "recurring"  . High cholesterol   . Hyperlipidemia   . Hypertension   . 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 Migraines    "til ~ 1980"  . PONV (postoperative nausea and vomiting)   . Restless leg syndrome   . Sciatic nerve pain    "from pinched nerve"  . Sleep apnea    uses CPAP  . Weakness of right side of body    "I've had PT for it; they don't know what it's from".  CORTISONE INJECTION INTO BACK 08/30/12   Past Surgical History:  Procedure Laterality Date  . [place]  12/29/2015   Procedure: [place] STUDY;  Surgeon: Manus Gunning, MD;  Location: [facility];  Service: Gastroenterology;  Laterality: N/A;  . CARPAL TUNNEL RELEASE Left 07/02/2015   Procedure: CARPAL TUNNEL RELEASE;  Surgeon: Frederik Pear, MD;  Location: [facility];  Service: Orthopedics;  Laterality: Left;  . CATARACT EXTRACTION, BILATERAL Bilateral    Oct and Nov 2017  . CORONARY ANGIOPLASTY WITH STENT PLACEMENT  06/2011   "1"  . CORONARY ARTERY BYPASS GRAFT  2005   CABG X 2  . DILATION AND CURETTAGE OF UTERUS     "more than once"  . ELBOW ARTHROSCOPY Left 07/02/2015   Procedure: ARTHROSCOPY LEFT ELBOW WITH DEBRIDEMENT AND REMOVAL  LOOSE BODY;  Surgeon: Frederik Pear, MD;  Location: [facility];  Service: Orthopedics;  Laterality: Left;  . ESOPHAGEAL MANOMETRY N/A 12/29/2015   Procedure: ESOPHAGEAL MANOMETRY (EM);  Surgeon: Manus Gunning, MD;  Location: [facility];  Service: Gastroenterology;  Laterality: N/A;  . FRACTURE SURGERY  ~ 2005   nose  . KNEE ARTHROSCOPY  09/04/2012   Procedure: ARTHROSCOPY KNEE;  Surgeon: Gearlean Alf, MD;  Location: [facility];  Service: Orthopedics;  Laterality: Right;  right knee arthroscopy with medial and lateral meniscus debridement  . 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 MOUTH SURGERY  2004   "bone replacement; had cadavear bones put in; face was collapsing"  . NASAL SEPTUM SURGERY  ~ 1986  . TOTAL KNEE ARTHROPLASTY Right 07/07/2013   Procedure: RIGHT TOTAL KNEE ARTHROPLASTY;  Surgeon: Gearlean Alf, MD;  Location: [facility];  Service: Orthopedics;  Laterality: Right;   Family History  Problem Relation Age of Onset  . Cancer Mother 78       breast cancer  . Breast cancer Mother 81  . Heart disease Father 69       sudden onset due to CAD  . Cancer Sister 41       colon  . Hypertension Sister   . Dementia Sister   . Diabetes Sister   . Colon cancer Sister   . GER disease Daughter   . Hypertension Daughter   . Heart disease Brother   . Hypertension Brother   . Heart attack Neg Hx   . Stroke Neg Hx    Social History   Socioeconomic History  . Marital status: Married    Spouse name: Not on file  . Number of children: 2  . Years of education: college  . Highest education level: Not on file  Occupational History  . Occupation: Retired   Scientific laboratory technician  . Financial resource strain: Not on file  . Food insecurity:    Worry: Not on file    Inability: Not on file  . Transportation needs:    Medical: Not on file    Non-medical: Not on file  Tobacco Use  . Smoking status: Never Smoker  . Smokeless tobacco: Never Used  Substance  and Sexual Activity  . Alcohol use: Yes    Alcohol/week: 1.0 standard drinks    Types: 1 Glasses of wine per week    Comment: "occasionally drink wine"  . Drug use: No  . Sexual activity: Yes  Lifestyle  . Physical activity:    Days per week: Not on file    Minutes per session: Not on file  . Stress: Not on file  Relationships  . Social connections:    Talks on phone: Not on file  Gets together: Not on file    Attends religious service: Not on file    Active member of club or organization: Not on file    Attends meetings of clubs or organizations: Not on file    Relationship status: Not on file  Other Topics Concern  . Not on file  Social History Narrative   Drinks 1 cup of coffee a day     Outpatient Encounter Medications as of 12/23/2018  Medication Sig  . aspirin EC 81 MG tablet Take 1 tablet (81 mg total) by mouth daily.  [name] atorvastatin (LIPITOR) 80 MG tablet TAKE 1 TABLET BY MOUTH EVERY DAY  . CARAFATE 1 GM/10ML suspension TAKE 10 MLS (1 G TOTAL) BY MOUTH EVERY 6 (SIX) HOURS AS NEEDED.  [name] cetirizine (ZYRTEC) 10 MG tablet Take 10 mg by mouth daily. Reported on 12/23/2015  . clonazePAM (KLONOPIN) 1 MG tablet TAKE 2 TABLETS (2 MG TOTAL) BY MOUTH AT BEDTIME.  [name] DEXILANT 60 MG capsule TAKE 1 CAPSULE BY MOUTH EVERY DAY  . gabapentin (NEURONTIN) 300 MG capsule TAKE 1 TO 2 CAPSULES BY MOUTH AT BEDTIME  . glucose blood (FREESTYLE LITE) test strip Use to check blood sugar twice daily  . Insulin Pen Needle (NOVOFINE) 32G X 6 MM MISC Use to inject Victoza daily.  Dx: E11.9  . Lancet Device MISC Use to check blood sugar twice daily.  . Lancets (FREESTYLE) lancets Use to check blood sugar twice daily  . meloxicam (MOBIC) 15 MG tablet Take 15 mg by mouth daily. Reported on 12/23/2015  . metFORMIN (GLUCOPHAGE-XR) 500 MG 24 hr tablet Take 2 tablets (1,000 mg total) by mouth 2 (two) times daily.  . metoprolol succinate (TOPROL-XL) 100 MG 24 hr tablet TAKE 1 TABLET (100 MG TOTAL) BY MOUTH  DAILY. TAKE WITH OR IMMEDIATELY FOLLOWING A MEAL.  . Multiple Vitamins-Minerals (PRESERVISION AREDS PO) Take 1 tablet by mouth 2 (two) times daily. Reported on 12/23/2015  . MYRBETRIQ 25 MG TB24 tablet TAKE 1 TABLET BY MOUTH EVERY DAY  . nitroGLYCERIN (NITROSTAT) 0.4 MG SL tablet PLACE 1 TABLET (0.4 MG TOTAL) UNDER THE TONGUE EVERY 5 (FIVE) MINUTES AS NEEDED FOR CHEST PAIN  . Probiotic Product (PROBIOTIC DAILY PO) Take 1 tablet by mouth daily.   [name] tiZANidine (ZANAFLEX) 4 MG tablet TAKE 1 TABLET BY MOUTH EVERY DAY IN THE EVENING  . traMADol (ULTRAM) 50 MG tablet Take 50 mg by mouth every 6 (six) hours as needed. for pain  . venlafaxine XR (EFFEXOR-XR) 37.5 MG 24 hr capsule TAKE 1 CAPSULE (37.5 MG TOTAL) BY MOUTH 2 (TWO) TIMES DAILY.  [name] VICTOZA 18 MG/3ML SOPN INJECT 1.8 MG INTO THE SKIN DAILY.   No facility-administered encounter medications on file as of 12/23/2018.     Activities of Daily Living In your present state of health, do you have any difficulty performing the following activities: 12/23/2018  Hearing? N  Vision? N  Difficulty concentrating or making decisions? N  Walking or climbing stairs? N  Dressing or bathing? N  Doing errands, shopping? N  Preparing Food and eating ? N  Using the Toilet? N  In the past six months, have you accidently leaked urine? N  Do you have problems with loss of bowel control? N  Managing your Medications? N  Managing your Finances? N  Housekeeping or managing your Housekeeping? N  Some recent data might be hidden    Patient Care Team: Jinny Sanders, MD as PCP - General (Family Medicine) [place],  Thana Farr, MD as PCP - Cardiology (Cardiology) Sueanne Margarita, MD as PCP - Sleep Medicine (Sleep Medicine) Monna Fam, MD as Consulting Physician (Ophthalmology)    Assessment:   This is a routine wellness examination for [place].   Hearing Screening   125Hz  250Hz  500Hz  1000Hz  2000Hz  3000Hz  4000Hz  6000Hz  8000Hz   Right ear:   0 0 40  40    Left  ear:   40 40 40  40    Vision Screening Comments: Vision exam in Feb 2019 @ [facility]   Exercise Activities and Dietary recommendations Current Exercise Habits: Home exercise routine;Structured exercise class, Type of exercise: stretching;Other - see comments(physical therapy), Time (Minutes): 30, Frequency (Times/Week): 7, Weekly Exercise (Minutes/Week): 210, Intensity: Mild, Exercise limited by: orthopedic condition(s)  Goals    . Increase physical activity     As scheduled, I will continue to attend physical therapy for 45 minutes 2 days to per week. Additionally, I will continue to do PT exercises for 30 minutes daily.        Fall Risk Fall Risk  12/23/2018 12/07/2017 11/14/2016 06/19/2016 08/10/2015  Falls in the past year? 1 No No Yes Yes  Number falls in past yr: 0 - - 2 or more 2 or more  Injury with Fall? 1 - - No Yes  Risk Factor Category  - - - - -  Risk for fall due to : - - - History of fall(s) Impaired mobility  Follow up - - - Falls prevention discussed -   Depression Screen PHQ 2/9 Scores 12/23/2018 12/07/2017 11/14/2016 08/10/2015  PHQ - 2 Score 0 1 0 1  PHQ- 9 Score 0 3 - -     Cognitive Function MMSE - Mini Mental State Exam 12/23/2018 12/07/2017 11/14/2016  Orientation to time 5 5 5   Orientation to Place 5 5 5   Registration 3 3 3   Attention/ Calculation 0 0 0  Recall 3 3 3   Language- name 2 objects 0 0 0  Language- repeat 1 1 1   Language- follow 3 step command 3 3 3   Language- read & follow direction 0 0 0  Write a sentence 0 0 0  Copy design 0 0 0  Total score 20 20 20      PLEASE NOTE: A Mini-Cog screen was completed. Maximum score is 20. A value of 0 denotes this part of [place] MMSE was not completed or the patient failed this part of the Mini-Cog screening.   Mini-Cog Screening Orientation to Time - Max 5 pts Orientation to Place - Max 5 pts Registration - Max 3 pts Recall - Max 3 pts Language Repeat - Max 1 pts Language Follow 3 Step Command - Max 3 pts      Immunization History  Administered Date(s) Administered  . Influenza, High Dose Seasonal PF 08/04/2016, 08/21/2017, 08/07/2018  . Influenza,inj,Quad PF,6+ Mos 07/23/2014, 08/10/2015  . Pneumococcal Conjugate-13 10/27/2014  . Pneumococcal Polysaccharide-23 09/02/2007, 10/03/2013  . Td 11/14/2007  . Tdap 06/25/2018  . Zoster 02/09/2010  . Zoster Recombinat (Shingrix) 03/05/2018    Screening Tests Health Maintenance  Topic Date Due  . OPHTHALMOLOGY EXAM  06/10/2019  . FOOT EXAM  06/21/2019  . HEMOGLOBIN A1C  06/23/2019  . COLONOSCOPY  10/14/2019  . URINE MICROALBUMIN  12/24/2019  . TETANUS/TDAP  06/25/2028  . INFLUENZA VACCINE  Completed  . DEXA SCAN  Completed  . PNA vac Low Risk Adult  Completed      Plan:     I have  personally reviewed, addressed, and noted the following in the patient's chart:  A. Medical and social history B. Use of alcohol, tobacco or illicit drugs  C. Current medications and supplements D. Functional ability and status E.  Nutritional status F.  Physical activity G. Advance directives H. List of other physicians I.  Hospitalizations, surgeries, and ER visits in previous 12 months J.  [place] to include hearing, vision, cognitive, depression L. Referrals and appointments - none  In addition, I have reviewed and discussed with patient certain preventive protocols, quality metrics, and best practice recommendations. A written personalized care plan for preventive services as well as general preventive health recommendations were provided to patient.  See attached scanned questionnaire for additional information.   Signed,   Lindell Noe, MHA, BS, LPN Health Coach

## 2018-12-23 NOTE — Patient Instructions (Signed)
Ms. Swilling , Thank you for taking time to come for your Medicare Wellness Visit. I appreciate your ongoing commitment to your health goals. Please review the following plan we discussed and let me know if I can assist you in the future.   These are the goals we discussed: Goals    . Increase physical activity     As scheduled, I will continue to attend physical therapy for 45 minutes 2 days to per week. Additionally, I will continue to do PT exercises for 30 minutes daily.        This is a list of the screening recommended for you and due dates:  Health Maintenance  Topic Date Due  . Urine Protein Check  12/07/2018  . Hemoglobin A1C  12/18/2018  . Eye exam for diabetics  06/10/2019  . Complete foot exam   06/21/2019  . Colon Cancer Screening  10/14/2019  . Tetanus Vaccine  06/25/2028  . Flu Shot  Completed  . DEXA scan (bone density measurement)  Completed  . Pneumonia vaccines  Completed   Preventive Care for Adults  A healthy lifestyle and preventive care can promote health and wellness. Preventive health guidelines for adults include the following key practices.  . A routine yearly physical is a good way to check with your health care provider about your health and preventive screening. It is a chance to share any concerns and updates on your health and to receive a thorough exam.  . Visit your dentist for a routine exam and preventive care every 6 months. Brush your teeth twice a day and floss once a day. Good oral hygiene prevents tooth decay and gum disease.  . The frequency of eye exams is based on your age, health, family medical history, use  of contact lenses, and other factors. Follow your health care provider's recommendations for frequency of eye exams.  . Eat a healthy diet. Foods like vegetables, fruits, whole grains, low-fat dairy products, and lean protein foods contain the nutrients you need without too many calories. Decrease your intake of foods high in solid  fats, added sugars, and salt. Eat the right amount of calories for you. Get information about a proper diet from your health care provider, if necessary.  . Regular physical exercise is one of the most important things you can do for your health. Most adults should get at least 150 minutes of moderate-intensity exercise (any activity that increases your heart rate and causes you to sweat) each week. In addition, most adults need muscle-strengthening exercises on 2 or more days a week.  Silver Sneakers may be a benefit available to you. To determine eligibility, you may visit the website: www.silversneakers.com or contact program at (985)084-5169 Mon-Fri between 8AM-8PM.   . Maintain a healthy weight. The body mass index (BMI) is a screening tool to identify possible weight problems. It provides an estimate of body fat based on height and weight. Your health care provider can find your BMI and can help you achieve or maintain a healthy weight.   For adults 20 years and older: ? A BMI below 18.5 is considered underweight. ? A BMI of 18.5 to 24.9 is normal. ? A BMI of 25 to 29.9 is considered overweight. ? A BMI of 30 and above is considered obese.   . Maintain normal blood lipids and cholesterol levels by exercising and minimizing your intake of saturated fat. Eat a balanced diet with plenty of fruit and vegetables. Blood tests for lipids and cholesterol  should begin at age 26 and be repeated every 5 years. If your lipid or cholesterol levels are high, you are over 50, or you are at high risk for heart disease, you may need your cholesterol levels checked more frequently. Ongoing high lipid and cholesterol levels should be treated with medicines if diet and exercise are not working.  . If you smoke, find out from your health care provider how to quit. If you do not use tobacco, please do not start.  . If you choose to drink alcohol, please do not consume more than 2 drinks per day. One drink is  considered to be 12 ounces (355 mL) of beer, 5 ounces (148 mL) of wine, or 1.5 ounces (44 mL) of liquor.  . If you are 57-76 years old, ask your health care provider if you should take aspirin to prevent strokes.  . Use sunscreen. Apply sunscreen liberally and repeatedly throughout the day. You should seek shade when your shadow is shorter than you. Protect yourself by wearing long sleeves, pants, a wide-brimmed hat, and sunglasses year round, whenever you are outdoors.  . Once a month, do a whole body skin exam, using a mirror to look at the skin on your back. Tell your health care provider of new moles, moles that have irregular borders, moles that are larger than a pencil eraser, or moles that have changed in shape or color.

## 2018-12-23 NOTE — Therapy (Signed)
[place] MAIN [facility] SERVICES [address] [place], [place], [address] Phone: [phone]   Fax:  [phone]  Physical Therapy Treatment  Patient Details  Name: Morgan [name] MRN: [id number] Date of Birth: 04/21/1941 Referring Provider (PT): Dr. Mayer Camel   Encounter Date: 12/23/2018  PT End of Session - 12/23/18 0955    Visit Number  19    Number of Visits  33    Date for PT Re-Evaluation  01/22/19    Authorization Type  Last goals: 11/27/18, updated 12/23/18, 09/30/18 (eval)    PT Start Time  0953    PT Stop Time  1033    PT Time Calculation (min)  40 min    Activity Tolerance  Patient limited by pain    Behavior During Therapy  [facility] for tasks assessed/performed       Past Medical History:  Diagnosis Date  . Angina   . Arthritis   . Atrial fibrillation (HCC)    ASPIRIN FOR BLOOD THINNER  . Atypical mole 09/22/2016  . Bulging discs    lumbar   . Cancer ([place])    melanoma  . Complication of anesthesia    pt states has choking sensation with ET tube   . Coronary artery disease   . Depression   . Diabetes mellitus    diet controlled/on meds  . Fibromyalgia   . GERD (gastroesophageal reflux disease)   . H/O hiatal hernia   . Headache(784.0)    "recurring"  . High cholesterol   . Hyperlipidemia   . Hypertension   . 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 Migraines    "til ~ 1980"  . PONV (postoperative nausea and vomiting)   . Restless leg syndrome   . Sciatic nerve pain    "from pinched nerve"  . Sleep apnea    uses CPAP  . Weakness of right side of body    "I've had PT for it; they don't know what it's from".  CORTISONE INJECTION INTO BACK 08/30/12    Past Surgical History:  Procedure Laterality Date  . [place] STUDY N/A 12/29/2015   Procedure: [place] STUDY;  Surgeon: Manus Gunning, MD;  Location: [facility];  Service: Gastroenterology;  Laterality: N/A;  . CARPAL TUNNEL RELEASE Left 07/02/2015   Procedure: CARPAL  TUNNEL RELEASE;  Surgeon: Frederik Pear, MD;  Location: [place];  Service: Orthopedics;  Laterality: Left;  . CATARACT EXTRACTION, BILATERAL Bilateral    Oct and Nov 2017  . CORONARY ANGIOPLASTY WITH STENT PLACEMENT  06/2011   "1"  . CORONARY ARTERY BYPASS GRAFT  2005   CABG X 2  . DILATION AND CURETTAGE OF UTERUS     "more than once"  . ELBOW ARTHROSCOPY Left 07/02/2015   Procedure: ARTHROSCOPY LEFT ELBOW WITH DEBRIDEMENT AND REMOVAL LOOSE BODY;  Surgeon: Frederik Pear, MD;  Location: [place];  Service: Orthopedics;  Laterality: Left;  . ESOPHAGEAL MANOMETRY N/A 12/29/2015   Procedure: ESOPHAGEAL MANOMETRY (EM);  Surgeon: Manus Gunning, MD;  Location: [facility];  Service: Gastroenterology;  Laterality: N/A;  . FRACTURE SURGERY  ~ 2005   nose  . KNEE ARTHROSCOPY  09/04/2012   Procedure: ARTHROSCOPY KNEE;  Surgeon: Gearlean Alf, MD;  Location: [facility];  Service: Orthopedics;  Laterality: Right;  right knee arthroscopy with medial and lateral meniscus debridement  . 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 MOUTH SURGERY  2004   "  bone replacement; had cadavear bones put in; face was collapsing"  . NASAL SEPTUM SURGERY  ~ 1986  . TOTAL KNEE ARTHROPLASTY Right 07/07/2013   Procedure: RIGHT TOTAL KNEE ARTHROPLASTY;  Surgeon: Gearlean Alf, MD;  Location: [facility];  Service: Orthopedics;  Laterality: Right;    There were no vitals filed for this visit.  Subjective Assessment - 12/23/18 0955    Subjective  Pt reports she is doing well today. She is performing her HEP but reports some mild tightness in her R shoulder with pulleys. Denies pain upon arrival. No specific questions or concerns at this time.     Pertinent History  On 08/03/18 pt was carrying a folding table and fell and right arm was under the handle got stuck between the door and the table. She landed on her R side. Tried to get up and had to pull at her arm. Pt had  immediate pain in her right shoulder. She waiteduntil Monday and went to see Dr. Edilia Bo her PCP. He took radiographs but didn't find any fractures. She continued to have pain so she called Dr. Damita Dunnings office, her orthopedist, and saw him that same week. He took additional radiographs and found a hairline fracture in her humeral shaft and a greater tuberosity fracture per patient report. She states that she is not healing well and has recurrent plain film radiographs. She is still wearing a sling and was encouraged not to perform any AROM of her right shoulder. Since her last visit with Dr. Mayer Camel on 09/20/18 she has had an additional fall and landed on her R shoulder. She reports additional bruising and pain in her R shoulder. She has a follow-up appt scheduled with her orthopedist for 10/15/18. 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 ROS negative for red flags.    Limitations  Lifting    Diagnostic tests  Plain film radiographs with decreased healing per pt report    Patient Stated Goals  Improve ability to use RUE and decrease pain    Currently in Pain?  No/denies         [facility] PT Assessment - 12/23/18 1004      Observation/Other Assessments   Other Surveys   Other Surveys    Quick DASH   40.9%        TREATMENT   Ther-ex Warm-up on UBE 2 minutes forward/108mnutes backwards for AAROM during history with therapist monitoring for pain,  Pt completed QuickDASH=40.9% (unbilled); UE ranger for R shoulder flexion x 15, scaption x 15, CW circles x 15, CCW circles x 15; Bilateral shoulder PROM measures obtained: R/L 155/150 Shoulder flexion, 134/165 Shoulder abduction, 89/93 Shoulder external rotation, 70/70 Shoulder internal rotation;  HBH: R: T1, L: T4 HBB: R: T11, L: T7; MMT:  R/L 4-*/4+ Shoulder flexion (anterior deltoid/pec major/coracobrachialis, axillary n. (C5-6) and musculocutaneous n. (C5-7) 4*/4+ Shoulder abduction (deltoid/supraspinatus,  axillary/suprascapular n, C5) 4+/4+ Shoulder external rotation (infraspinatus/teres minor) 4/4+ Shoulder internal rotation (subcapularis/lats/pec major) 4-/4- Shoulder extension (posterior deltoid, lats, teres major, axillary/thoracodorsal n.) 3+/4- Shoulder horizontal abduction 4+/5 Elbow flexion (biceps brachii, brachialis, brachioradialis, musculoskeletal n, C5-6) 4+/4+ Elbow extension (triceps, radial n, C7) *Indicates pain  L sidelying R shoulder abduction with unweighted to available end range x 10, attempted with 1# DB but too painful so discontinued; L sidelying R shoulder ER with 1# DB 2 x 10, challenging for the patient; Supine R shoulder flexion with 1# weight to available end range x 10;  Pt educated throughout session about proper posture and technique.Improved technique, movement at target joints, after min to mod verbal, visual, tactile cues.   Pt demonstrates good motivation during session.She is improving with respect to her R shoulder AAROM/PROM with her most significant residual range of motion limitation in abduction. She also presents with weakness in R shoulder flexion, abduction, internal rotation, and horizontal abduction when compared to the left side. Her range of motion is grossly functional however but she continues to struggle with reaching into high cabinets/shelves. Her QuickDASH has decreased from 84.1% in November 2019 to 40.9% today. In addition her worst shoulder pain has decreased from a 9/10 to a 5/10 and she is experiencing pain much less frequently. Ptdncouraged to follow-up as scheduledand continue HEP.Pt will benefit from PT services to address deficits in strength,mobility, and painin order to return to full function at home.                        PT Short Term Goals - 12/23/18 0956      PT SHORT TERM GOAL #1   Title  Pt will be independent with HEP in order to improve strength and decrease pain in order to improve  pain-free function at home.    Time  4    Period  Weeks    Status  Achieved        PT Long Term Goals - 12/23/18 0956      PT LONG TERM GOAL #1   Title  Pt will decrease quick DASH score by at least 8% in order to demonstrate clinically significant reduction in disability.    Baseline  09/30/18: 84.1%; 10/30/18: 56.8%; 11/27/18: 45.45%; 12/23/18: 40.9%    Time  8    Period  Weeks    Status  Achieved      PT LONG TERM GOAL #2   Title  Pt will decrease worst pain as reported on NPRS by at least 3 points in order to demonstrate clinically significant reduction in pain.    Baseline  09/30/18: worst: 9/10; 10/30/18: 6/10;    Time  8    Period  Weeks    Status  Achieved      PT LONG TERM GOAL #3   Title  Pt will improve PROM of R shoulder to within 10 degrees of R shoulder in order to improve functional use of R shoulder for ADLs/IADLs.    Baseline  R/L 129/150 Shoulder flexion, 100/165 Shoulder abduction, 50/93 Shoulder external rotation, 50/70 Shoulder internal rotation; 10/30/18: R/L 140/150 Shoulder flexion, 132/165 Shoulder abduction, 82/93 Shoulder external rotation, 70/70 Shoulder internal rotation; 12/23/18:  R/L 155/150 Shoulder flexion, 134/165 Shoulder abduction, 89/93 Shoulder external rotation, 70/70 Shoulder internal rotation;    Time  8    Period  Weeks    Status  Partially Met    Target Date  01/22/19      PT LONG TERM GOAL #4   Title  Pt will decrease worst pain as reported on NPRS by at least 3 points (3 or less) in order to demonstrate clinically significant reduction in pain.    Baseline  10/30/18: 6/10; 11/27/18: 4/10; 12/23/18: 5/10 (improved pain frequency)    Time  8    Period  Weeks    Status  Partially Met    Target Date  01/22/19      PT LONG TERM GOAL #5   Title  Pt will decrease quick DASH score by at least 8%  in order to demonstrate clinically significant reduction in disability.    Baseline  11/27/18: 45.45%; 12/23/18: 40.9%    Time  8    Period  Weeks     Status  Partially Met    Target Date  01/22/19      Additional Long Term Goals   Additional Long Term Goals  Yes      PT LONG TERM GOAL #6   Title  Pt will improve pain-free R shoulder strength for flexion and abduction so that it is symmetrical with L shoulder in order to return to full function at home    Baseline  12/23/18: R/L 4-/4+ Shoulder flexion, 4/4+ Shoulder abduction, both painful on R side    Time  8    Period  Weeks    Status  New    Target Date  01/22/19            Plan - 12/23/18 0956    Clinical Impression Statement  Pt demonstrates good motivation during session.She is improving with respect to her R shoulder AAROM/PROM with her most significant residual range of motion limitation in abduction. She also presents with weakness in R shoulder flexion, abduction, internal rotation, and horizontal abduction when compared to the left side. Her range of motion is grossly functional however but she continues to struggle with reaching into high cabinets/shelves. Her QuickDASH has decreased from 84.1% in November 2019 to 40.9% today. In addition her worst shoulder pain has decreased from a 9/10 to a 5/10 and she is experiencing pain much less frequently. Ptdncouraged to follow-up as scheduledand continue HEP.Pt will benefit from PT services to address deficits in strength,mobility, and painin order to return to full function at home.    Rehab Potential  Good    PT Frequency  2x / week    PT Duration  8 weeks    PT Treatment/Interventions  ADLs/Self Care Home Management;Aquatic Therapy;Canalith Repostioning;Cryotherapy;Electrical Stimulation;Iontophoresis '4mg'$ /ml Dexamethasone;Moist Heat;Traction;Ultrasound;Contrast Bath;DME Instruction;Gait training;Stair training;Functional mobility training;Therapeutic activities;Therapeutic exercise;Balance training;Neuromuscular re-education;Patient/family education;Manual techniques;Passive range of motion;Dry  needling;Splinting;Taping;Vestibular    PT Next Visit Plan  Progress note, Review HEP and progress as needed, Progress AAROM/AROM/strengthening of shoulder as tolerated without pain    PT Home Exercise Plan  Cervical retractions, Scapular retractions, R upper trap stretch, supine canes for AAROM flexion with hands together progressing to AROM supine flexion, AROM sidelying shoulder abduction, AROM sidelying shoulder ER, seated pulleys for flexion and scaption ([id number])    Consulted and Agree with Plan of Care  Patient       Patient will benefit from skilled therapeutic intervention in order to improve the following deficits and impairments:  Decreased strength, Decreased range of motion, Impaired UE functional use, Pain  Visit Diagnosis: Acute pain of right shoulder  Stiffness of right shoulder, not elsewhere classified  Muscle weakness (generalized)     Problem List Patient Active Problem List   Diagnosis Date Noted  . Malignant melanoma ([place]) 11/23/2016  . Urge incontinence 11/23/2016  . Atypical mole 09/22/2016  . Slurring of speech 05/12/2016  . Traumatic arthritis elbow 05/04/2015  . Left foot pain 12/31/2014  . Severe sprain of left ankle 12/31/2014  . Osteopenia 09/15/2014  . History of TIA (transient ischemic attack) 05/19/2014  . Fracture of coronoid process of ulna, left, closed 04/25/2014  . Ingrown toenail 01/15/2014  . Obesity (BMI 30-39.9) 10/08/2013  . OA (osteoarthritis) of knee 07/07/2013  . Chronic low back pain 05/09/2013  . Obstructive sleep apnea 05/09/2013  .  GERD (gastroesophageal reflux disease) 05/09/2013  . Hiatal hernia 05/09/2013  . Fibromyalgia syndrome 05/09/2013  . Depression, major, in partial remission ([place]) 05/09/2013  . RLS (restless legs syndrome) 05/09/2013  . Diabetes mellitus with no complication ([place]) 03/30/3357  . High cholesterol 05/09/2013  . 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 Family history of colon cancer 05/09/2013  .  Allergic rhinitis 05/09/2013  . Lateral meniscal tear 09/04/2012  . CAD (coronary artery disease) 09/27/2011  . HTN (hypertension) 09/27/2011  . Paroxysmal A-fib ([place]) 09/27/2011   Phillips Grout PT, DPT, GCS  Huprich,Jason 12/23/2018, 8:41 PM  [place] MAIN [facility] SERVICES [address] [place], [place], [address] Phone: [phone]   Fax:  [phone]  Name: Morgan [name] MRN: [id number] Date of Birth: Jun 16, 1941

## 2018-12-23 NOTE — Progress Notes (Signed)
PCP notes:   Health maintenance:  A1C - completed Microalbumin - completed  Abnormal screenings:   Hearing - failed  Hearing Screening   125Hz  250Hz  500Hz  1000Hz  2000Hz  3000Hz  4000Hz  6000Hz  8000Hz   Right ear:   0 0 40  40    Left ear:   40 40 40  40     Patient concerns:   None  Nurse concerns:  None  Next PCP appt:   12/27/18 @ 1045

## 2018-12-24 ENCOUNTER — Encounter: Payer: Medicare Other | Admitting: Family Medicine

## 2018-12-24 NOTE — Progress Notes (Signed)
I reviewed health advisor's note, was available for consultation, and agree with documentation and plan.   Signed,  Jilliane Kazanjian T. Starsha Morning, MD  

## 2018-12-25 ENCOUNTER — Ambulatory Visit: Payer: Medicare Other

## 2018-12-25 DIAGNOSIS — M6281 Muscle weakness (generalized): Secondary | ICD-10-CM

## 2018-12-25 DIAGNOSIS — M25511 Pain in right shoulder: Secondary | ICD-10-CM | POA: Diagnosis not present

## 2018-12-25 DIAGNOSIS — M25611 Stiffness of right shoulder, not elsewhere classified: Secondary | ICD-10-CM

## 2018-12-25 NOTE — Therapy (Signed)
[place] MAIN [facility] SERVICES [address] [place], [place], [address] Phone: [phone]   Fax:  [phone]  Physical Therapy Progress Note   Dates of reporting period  11/18/18   to   12/25/18  Patient Details  Name: Erykah Lippert MRN: [id number] Date of Birth: May 24, 1941 Referring Provider (PT): Dr. Mayer Camel   Encounter Date: 12/25/2018  PT End of Session - 12/25/18 0942    Visit Number  20    Number of Visits  33    Date for PT Re-Evaluation  01/22/19    Authorization Type  Last goals: 12/23/18, 09/30/18 (eval)    PT Start Time  0935    PT Stop Time  1015    PT Time Calculation (min)  40 min    Activity Tolerance  Patient limited by pain    Behavior During Therapy  [facility] for tasks assessed/performed       Past Medical History:  Diagnosis Date  . Angina   . Arthritis   . Atrial fibrillation (HCC)    ASPIRIN FOR BLOOD THINNER  . Atypical mole 09/22/2016  . Bulging discs    lumbar   . Cancer ([place])    melanoma  . Complication of anesthesia    pt states has choking sensation with ET tube   . Coronary artery disease   . Depression   . Diabetes mellitus    diet controlled/on meds  . Fibromyalgia   . GERD (gastroesophageal reflux disease)   . H/O hiatal hernia   . Headache(784.0)    "recurring"  . High cholesterol   . Hyperlipidemia   . Hypertension   . 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 Migraines    "til ~ 1980"  . PONV (postoperative nausea and vomiting)   . Restless leg syndrome   . Sciatic nerve pain    "from pinched nerve"  . Sleep apnea    uses CPAP  . Weakness of right side of body    "I've had PT for it; they don't know what it's from".  CORTISONE INJECTION INTO BACK 08/30/12    Past Surgical History:  Procedure Laterality Date  . [place] STUDY N/A 12/29/2015   Procedure: [place] STUDY;  Surgeon: Manus Gunning, MD;  Location: [facility];  Service: Gastroenterology;  Laterality: N/A;  . CARPAL TUNNEL  RELEASE Left 07/02/2015   Procedure: CARPAL TUNNEL RELEASE;  Surgeon: Frederik Pear, MD;  Location: [place];  Service: Orthopedics;  Laterality: Left;  . CATARACT EXTRACTION, BILATERAL Bilateral    Oct and Nov 2017  . CORONARY ANGIOPLASTY WITH STENT PLACEMENT  06/2011   "1"  . CORONARY ARTERY BYPASS GRAFT  2005   CABG X 2  . DILATION AND CURETTAGE OF UTERUS     "more than once"  . ELBOW ARTHROSCOPY Left 07/02/2015   Procedure: ARTHROSCOPY LEFT ELBOW WITH DEBRIDEMENT AND REMOVAL LOOSE BODY;  Surgeon: Frederik Pear, MD;  Location: [place];  Service: Orthopedics;  Laterality: Left;  . ESOPHAGEAL MANOMETRY N/A 12/29/2015   Procedure: ESOPHAGEAL MANOMETRY (EM);  Surgeon: Manus Gunning, MD;  Location: [facility];  Service: Gastroenterology;  Laterality: N/A;  . FRACTURE SURGERY  ~ 2005   nose  . KNEE ARTHROSCOPY  09/04/2012   Procedure: ARTHROSCOPY KNEE;  Surgeon: Gearlean Alf, MD;  Location: [facility];  Service: Orthopedics;  Laterality: Right;  right knee arthroscopy with medial and lateral meniscus debridement  . MELANOMA EXCISION Right 10/13/2016  right side of neck  . MOUTH SURGERY  2004   "bone replacement; had cadavear bones put in; face was collapsing"  . NASAL SEPTUM SURGERY  ~ 1986  . TOTAL KNEE ARTHROPLASTY Right 07/07/2013   Procedure: RIGHT TOTAL KNEE ARTHROPLASTY;  Surgeon: Gearlean Alf, MD;  Location: [facility];  Service: Orthopedics;  Laterality: Right;    There were no vitals filed for this visit.  Subjective Assessment - 12/25/18 0941    Subjective  Pt reports she is doing well today. She is performing her HEP.  No specific questions or concerns at this time.     Pertinent History  On 08/03/18 pt was carrying a folding table and fell and right arm was under the handle got stuck between the door and the table. She landed on her R side. Tried to get up and had to pull at her arm. Pt had immediate pain in her right shoulder. She  waiteduntil Monday and went to see Dr. Edilia Bo her PCP. He took radiographs but didn't find any fractures. She continued to have pain so she called Dr. Damita Dunnings office, her orthopedist, and saw him that same week. He took additional radiographs and found a hairline fracture in her humeral shaft and a greater tuberosity fracture per patient report. She states that she is not healing well and has recurrent plain film radiographs. She is still wearing a sling and was encouraged not to perform any AROM of her right shoulder. Since her last visit with Dr. Mayer Camel on 09/20/18 she has had an additional fall and landed on her R shoulder. She reports additional bruising and pain in her R shoulder. She has a follow-up appt scheduled with her orthopedist for 10/15/18. 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 ROS negative for red flags.    Limitations  Lifting    Diagnostic tests  Plain film radiographs with decreased healing per pt report    Patient Stated Goals  Improve ability to use RUE and decrease pain    Currently in Pain?  No/denies          TREATMENT   Ther-ex Standing UE ranger for flexion, scaption and CW/CCW circles x 15 each direction; Wall push-ups 2 x 10 with therapist challenging pt to place feet slightly farther from wall; Standing wall slides for low trap activation 2 x 10; Standing R shoulder scaption x 10 each with tactile and verbal feedback. More limitation noted in scaptionand scapular assist as well as passive assist providedintermittently; Supine R shoulder flexion with 1# weight to available end range x 10; L sidelying R shoulder abduction without weight and pt requiring assist today x 10; L sidelying R shoulder ER without weight and pt requiring assist today x 10, challenging for the patient;   Manual Therapy Standing R shoulder AROM flexion MWM for posterior glide with towel x 10; Seated R shoulder AROM flexion MWM for inferior glide with  hands x 10; Attempted seated R shoulder AROM MWM for scaption but too painful so discontinued; Supine R shoulder inferior mobilizations at available end range scaption, grade III, 30s/bout x 3 bouts; Supine R shoulder posterior mobilizations at 90 flexionpressing through elbow, grade III, 30s/bout x 1 bout; Pt educated throughout session about proper posture and technique.Improved technique, movement at target joints, after min to mod verbal, visual, tactile cues.   Pt educated throughout session about proper posture and technique with exercises. Improved exercise technique, movement at target joints, use of target  muscles after min to mod verbal, visual, tactile cues.    Pt demonstrates good motivation during session.She is improving with respect to her R shoulder AAROM/PROM with her most significant residual range of motion limitation in abduction. She also presents with weakness in R shoulder flexion, abduction, internal rotation, and horizontal abduction when compared to the left side. Her range of motion is grossly functional however but she continues to struggle with reaching into high cabinets/shelves. Her QuickDASH has decreased from 84.1% in November 2019 to 40.9%. In addition her worst shoulder pain has decreased from a 9/10 to a 5/10 and she is experiencing pain much less frequently. She does well with exercises today reporting only minimal increase in pain at end range. Ptencouraged to follow-up as scheduledand continue HEP.Pt will benefit from PT services to address deficits in strength,mobility, and painin order to return to full function at home.                        PT Short Term Goals - 12/23/18 0956      PT SHORT TERM GOAL #1   Title  Pt will be independent with HEP in order to improve strength and decrease pain in order to improve pain-free function at home.    Time  4    Period  Weeks    Status  Achieved        PT Long Term Goals - 12/23/18  0956      PT LONG TERM GOAL #1   Title  Pt will decrease quick DASH score by at least 8% in order to demonstrate clinically significant reduction in disability.    Baseline  09/30/18: 84.1%; 10/30/18: 56.8%; 11/27/18: 45.45%; 12/23/18: 40.9%    Time  8    Period  Weeks    Status  Achieved      PT LONG TERM GOAL #2   Title  Pt will decrease worst pain as reported on NPRS by at least 3 points in order to demonstrate clinically significant reduction in pain.    Baseline  09/30/18: worst: 9/10; 10/30/18: 6/10;    Time  8    Period  Weeks    Status  Achieved      PT LONG TERM GOAL #3   Title  Pt will improve PROM of R shoulder to within 10 degrees of R shoulder in order to improve functional use of R shoulder for ADLs/IADLs.    Baseline  R/L 129/150 Shoulder flexion, 100/165 Shoulder abduction, 50/93 Shoulder external rotation, 50/70 Shoulder internal rotation; 10/30/18: R/L 140/150 Shoulder flexion, 132/165 Shoulder abduction, 82/93 Shoulder external rotation, 70/70 Shoulder internal rotation; 12/23/18:  R/L 155/150 Shoulder flexion, 134/165 Shoulder abduction, 89/93 Shoulder external rotation, 70/70 Shoulder internal rotation;    Time  8    Period  Weeks    Status  Partially Met    Target Date  01/22/19      PT LONG TERM GOAL #4   Title  Pt will decrease worst pain as reported on NPRS by at least 3 points (3 or less) in order to demonstrate clinically significant reduction in pain.    Baseline  10/30/18: 6/10; 11/27/18: 4/10; 12/23/18: 5/10 (improved pain frequency)    Time  8    Period  Weeks    Status  Partially Met    Target Date  01/22/19      PT LONG TERM GOAL #5   Title  Pt will decrease quick DASH score by at  least 8% in order to demonstrate clinically significant reduction in disability.    Baseline  11/27/18: 45.45%; 12/23/18: 40.9%    Time  8    Period  Weeks    Status  Partially Met    Target Date  01/22/19      Additional Long Term Goals   Additional Long Term Goals  Yes       PT LONG TERM GOAL #6   Title  Pt will improve pain-free R shoulder strength for flexion and abduction so that it is symmetrical with L shoulder in order to return to full function at home    Baseline  12/23/18: R/L 4-/4+ Shoulder flexion, 4/4+ Shoulder abduction, both painful on R side    Time  8    Period  Weeks    Status  New    Target Date  01/22/19            Plan - 12/25/18 0943    Clinical Impression Statement  Pt demonstrates good motivation during session.She is improving with respect to her R shoulder AAROM/PROM with her most significant residual range of motion limitation in abduction. She also presents with weakness in R shoulder flexion, abduction, internal rotation, and horizontal abduction when compared to the left side. Her range of motion is grossly functional however but she continues to struggle with reaching into high cabinets/shelves. Her QuickDASH has decreased from 84.1% in November 2019 to 40.9%. In addition her worst shoulder pain has decreased from a 9/10 to a 5/10 and she is experiencing pain much less frequently. She does well with exercises today reporting only minimal increase in pain at end range. Ptencouraged to follow-up as scheduledand continue HEP.Pt will benefit from PT services to address deficits in strength,mobility, and painin order to return to full function at home.    Rehab Potential  Good    PT Frequency  2x / week    PT Duration  8 weeks    PT Treatment/Interventions  ADLs/Self Care Home Management;Aquatic Therapy;Canalith Repostioning;Cryotherapy;Electrical Stimulation;Iontophoresis 41m/ml Dexamethasone;Moist Heat;Traction;Ultrasound;Contrast Bath;DME Instruction;Gait training;Stair training;Functional mobility training;Therapeutic activities;Therapeutic exercise;Balance training;Neuromuscular re-education;Patient/family education;Manual techniques;Passive range of motion;Dry needling;Splinting;Taping;Vestibular    PT Next Visit Plan   Progress note, Review HEP and progress as needed, Progress AAROM/AROM/strengthening of shoulder as tolerated without pain    PT Home Exercise Plan  Cervical retractions, Scapular retractions, R upper trap stretch, supine canes for AAROM flexion with hands together progressing to AROM supine flexion, AROM sidelying shoulder abduction, AROM sidelying shoulder ER, seated pulleys for flexion and scaption ([id number])    Consulted and Agree with Plan of Care  Patient       Patient will benefit from skilled therapeutic intervention in order to improve the following deficits and impairments:  Decreased strength, Decreased range of motion, Impaired UE functional use, Pain  Visit Diagnosis: Acute pain of right shoulder  Stiffness of right shoulder, not elsewhere classified  Muscle weakness (generalized)     Problem List Patient Active Problem List   Diagnosis Date Noted  . [place] 11/23/2016  . Urge incontinence 11/23/2016  . Atypical mole 09/22/2016  . Slurring of speech 05/12/2016  . Traumatic arthritis elbow 05/04/2015  . Left foot pain 12/31/2014  . Severe sprain of left ankle 12/31/2014  . Osteopenia 09/15/2014  . History of TIA (transient ischemic attack) 05/19/2014  . Fracture of coronoid process of ulna, left, closed 04/25/2014  . Ingrown toenail 01/15/2014  . Obesity (BMI 30-39.9) 10/08/2013  . OA (osteoarthritis) of knee  07/07/2013  . Chronic low back pain 05/09/2013  . Obstructive sleep apnea 05/09/2013  . GERD (gastroesophageal reflux disease) 05/09/2013  . Hiatal hernia 05/09/2013  . Fibromyalgia syndrome 05/09/2013  . Depression, major, in partial remission ([place]) 05/09/2013  . RLS (restless legs syndrome) 05/09/2013  . Diabetes mellitus with no complication ([place]) 30/17/2091  . High cholesterol 05/09/2013  . 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 Family history of colon cancer 05/09/2013  . Allergic rhinitis 05/09/2013  . Lateral meniscal tear 09/04/2012   . CAD (coronary artery disease) 09/27/2011  . HTN (hypertension) 09/27/2011  . Paroxysmal A-fib ([place]) 09/27/2011   Phillips Grout PT, DPT, GCS  [place] Ladson 12/25/2018, 3:44 PM  [place] MAIN [facility] SERVICES [address] [place], [place], [address] Phone: [phone]   Fax:  [phone]  Name: Nandita Mathenia MRN: [id number] Date of Birth: 1941-04-11

## 2018-12-26 ENCOUNTER — Telehealth: Payer: Self-pay | Admitting: Cardiology

## 2018-12-26 NOTE — Telephone Encounter (Signed)
New Message   Pt c/o Shortness Of Breath: STAT if SOB developed within the last 24 hours or pt is noticeably SOB on the phone  1. Are you currently SOB (can you hear that pt is SOB on the phone)? Patient states she feels sob now but I do not hear it.   2. How long have you been experiencing SOB? For about a month  3. Are you SOB when sitting or when up moving around? Moving around   4. Are you currently experiencing any other symptoms? A lot of fatigue

## 2018-12-26 NOTE — Telephone Encounter (Signed)
Left message on pt's vm to c/b to discuss her concerns

## 2018-12-27 ENCOUNTER — Ambulatory Visit (INDEPENDENT_AMBULATORY_CARE_PROVIDER_SITE_OTHER): Payer: Medicare Other | Admitting: Family Medicine

## 2018-12-27 VITALS — BP 158/80 | HR 78 | Temp 97.6°F | Ht 62.25 in | Wt 153.2 lb

## 2018-12-27 DIAGNOSIS — Z0000 Encounter for general adult medical examination without abnormal findings: Secondary | ICD-10-CM | POA: Diagnosis not present

## 2018-12-27 DIAGNOSIS — F324 Major depressive disorder, single episode, in partial remission: Secondary | ICD-10-CM

## 2018-12-27 DIAGNOSIS — E119 Type 2 diabetes mellitus without complications: Secondary | ICD-10-CM

## 2018-12-27 DIAGNOSIS — I480 Paroxysmal atrial fibrillation: Secondary | ICD-10-CM | POA: Diagnosis not present

## 2018-12-27 DIAGNOSIS — E7800 Pure hypercholesterolemia, unspecified: Secondary | ICD-10-CM

## 2018-12-27 DIAGNOSIS — M8580 Other specified disorders of bone density and structure, unspecified site: Secondary | ICD-10-CM

## 2018-12-27 DIAGNOSIS — I10 Essential (primary) hypertension: Secondary | ICD-10-CM

## 2018-12-27 DIAGNOSIS — Z1231 Encounter for screening mammogram for malignant neoplasm of breast: Secondary | ICD-10-CM

## 2018-12-27 MED ORDER — CHLORTHALIDONE 25 MG PO TABS: 25.0000 mg | ORAL_TABLET | Freq: Every day | ORAL | 11 refills | Status: DC

## 2018-12-27 NOTE — Progress Notes (Signed)
Subjective:    Patient ID: Morgan [name], female    DOB: 02-10-1941, 78 y.o.   MRN: [id number]  HPI   The patient presents for  complete physical and review of chronic health problems. He/She also has the following acute concerns today: Needs referral for osteoporosis to Dr. Estanislado Pandy.  She is more fatigued and SOB.[name] she is not on HCTZ and does not know why this has been stopped. Also not sure why not on chlorthalidine wither.Renal issue s, no electrolyte issues.   She has 3-4 BMs a day. occ violent diarrhea.. ongoing for 6 months.  The patient saw Candis Musa, LPN for medicare wellness. Note reviewed in detail and important notes copied below.  Health maintenance:  A1C - completed Microalbumin - completed  Abnormal screenings:   Hearing - failed             Hearing Screening   125Hz  250Hz  500Hz  1000Hz  2000Hz  3000Hz  4000Hz  6000Hz  8000Hz   Right ear:   0 0 40  40    Left ear:   40 40 40  40     12/27/18 Today:  Fell in 07/2018...  Injured humerus and shoulder on right. She is currently in therapy. Followed by Dr. Mayer Camel.  She is using muscle relaxant , some tramadol for pain  PAinis getting better. .  Diabetes:   Good control on current regimen. Lab Results  Component Value Date   HGBA1C 6.3 12/23/2018  Using medications without difficulties: Hypoglycemic episodes: Hyperglycemic episodes: Feet problems:none Blood Sugars averaging: 100 to 144 FBS eye exam within last year: yes  Depression, major recurrent: Stable control on venlafaxine.  Using clonazepam at bedtime for insomnia and for restless legs.  Hypertension:   Elevated today .. nml at [facility] visit BP Readings from Last 3 Encounters:  12/27/18 (!) 158/80  12/23/18 124/80  12/18/18 (!) 138/54  Using medication without problems or lightheadedness: none Chest pain with exertion:none Edema:none Short of breath: worse again. Average home BPs: Other issues:  Elevated Cholesterol: At goal  <  70 on lipitor  CAD followed by Dr. Marlou Porch Lab Results  Component Value Date   CHOL 110 12/23/2018   HDL 45.60 12/23/2018   LDLCALC 50 12/23/2018   TRIG 72.0 12/23/2018   CHOLHDL 2 12/23/2018  Using medications without problems: Muscle aches:  Diet compliance: moderate Exercise:occ Other complaints:  Social History /Family History/Past Medical History reviewed in detail and updated in EMR if needed. Blood pressure (!) 158/80, pulse 78, temperature 97.6 F (36.4 C), temperature source Oral, height 5' 2.25" (1.581 m), weight 153 lb 4 oz (69.5 kg).     Review of Systems  Constitutional: Positive for fatigue. Negative for fever.  HENT: Negative for congestion.   Eyes: Negative for pain.  Respiratory: Negative for cough and shortness of breath.   Cardiovascular: Negative for chest pain, palpitations and leg swelling.  Gastrointestinal: Negative for abdominal pain.  Genitourinary: Negative for dysuria and vaginal bleeding.  Musculoskeletal: Negative for back pain.  Neurological: Negative for syncope, light-headedness and headaches.  Psychiatric/Behavioral: Negative for dysphoric mood.       Objective:   Physical Exam Constitutional:      General: She is not in acute distress.    Appearance: Normal appearance. She is well-developed. She is not ill-appearing or toxic-appearing.  HENT:     Head: Normocephalic.     Right Ear: Hearing, tympanic membrane, ear canal and external ear normal.     Left Ear: Hearing, tympanic membrane, ear canal and  external ear normal.     Nose: Nose normal.  Eyes:     General: Lids are normal. Lids are everted, no foreign bodies appreciated.     Conjunctiva/sclera: Conjunctivae normal.     Pupils: Pupils are equal, round, and reactive to light.  Neck:     Musculoskeletal: Normal range of motion and neck supple.     Thyroid: No thyroid mass or thyromegaly.     Vascular: No carotid bruit.     Trachea: Trachea normal.  Cardiovascular:     Rate  and Rhythm: Normal rate and regular rhythm.     Heart sounds: Normal heart sounds, S1 normal and S2 normal. No murmur. No gallop.   Pulmonary:     Effort: Pulmonary effort is normal. No respiratory distress.     Breath sounds: Normal breath sounds. No wheezing, rhonchi or rales.  Abdominal:     General: Bowel sounds are normal. There is no distension or abdominal bruit.     Palpations: Abdomen is soft. There is no fluid wave or mass.     Tenderness: There is no abdominal tenderness. There is no guarding or rebound.     Hernia: No hernia is present.  Lymphadenopathy:     Cervical: No cervical adenopathy.  Skin:    General: Skin is warm and dry.     Findings: No rash.  Neurological:     Mental Status: She is alert.     Cranial Nerves: No cranial nerve deficit.     Sensory: No sensory deficit.  Psychiatric:        Mood and Affect: Mood is not anxious or depressed.        Speech: Speech normal.        Behavior: Behavior normal. Behavior is cooperative.        Judgment: Judgment normal.      Diabetic foot exam: Normal inspection No skin breakdown No calluses  Normal DP pulses Normal sensation to light touch and monofilament Nails normal      Assessment & Plan:  The patient's preventative maintenance and recommended screening tests for an annual wellness exam were reviewed in full today. Brought up to date unless services declined.  Counselled on the importance of diet, exercise, and its role in overall health and mortality. The patient's FH and SH was reviewed, including their home life, tobacco status, and drug and alcohol status.   Vaccines:uptodate Pap/DVE:No pap, DVE not indicated... Asymptomatic. No family history of uterine or ovarian cancer. Mammo:09/2017, no yearly breast exam. Mother with breast cancer. Bone Density:09/2014 osteopenia, repeat 5 years Colon:last 2015 . Sister with strong family history colon cancer, plan repeat 5 years Smoking  Status:None

## 2018-12-27 NOTE — Patient Instructions (Addendum)
Restart chlorthalidone at previous dose. Follow BP at home... goal < 140/90  Our referral coordinator will call with bone density/mammogram appt.

## 2018-12-30 ENCOUNTER — Telehealth: Payer: Self-pay | Admitting: Family Medicine

## 2018-12-30 NOTE — Telephone Encounter (Signed)
Left message asking pt to call office regarding bone density and mammogram.  Transfer to [place]bin.  If im not here anyone up front can help pt

## 2018-12-31 ENCOUNTER — Ambulatory Visit: Payer: Medicare Other

## 2019-01-02 ENCOUNTER — Encounter: Payer: Self-pay | Admitting: Physical Therapy

## 2019-01-02 ENCOUNTER — Ambulatory Visit: Payer: Medicare Other | Admitting: Physical Therapy

## 2019-01-02 DIAGNOSIS — M25511 Pain in right shoulder: Secondary | ICD-10-CM

## 2019-01-02 DIAGNOSIS — M6281 Muscle weakness (generalized): Secondary | ICD-10-CM

## 2019-01-02 DIAGNOSIS — M25611 Stiffness of right shoulder, not elsewhere classified: Secondary | ICD-10-CM

## 2019-01-02 NOTE — Assessment & Plan Note (Signed)
Well controlled. Continue current medication.  

## 2019-01-02 NOTE — Assessment & Plan Note (Signed)
Elevated and pt with retuning SOB. Not sure why she stopped chlorthalidone. No reason in cardiology last note, the thought she was on it. It seemed to help with past SOB. Restart chlorthalidone at previous dose. Follow BP at home... goal < 140/90

## 2019-01-02 NOTE — Therapy (Signed)
[place] MAIN [facility] SERVICES [address] [place], [place], [address] Phone: [phone]   Fax:  [phone]  Physical Therapy Treatment  Patient Details  Name: Morgan [name] MRN: [id number] Date of Birth: 1941/03/28 Referring Provider (PT): Dr. Mayer Camel   Encounter Date: 01/02/2019  PT End of Session - 01/02/19 1443    Visit Number  21    Number of Visits  33    Date for PT Re-Evaluation  01/22/19    Authorization Type  Last goals: 12/23/18, 09/30/18 (eval)    PT Start Time  0235    PT Stop Time  0315    PT Time Calculation (min)  40 min    Activity Tolerance  Patient limited by pain    Behavior During Therapy  [facility] for tasks assessed/performed       Past Medical History:  Diagnosis Date  . Angina   . Arthritis   . Atrial fibrillation (HCC)    ASPIRIN FOR BLOOD THINNER  . Atypical mole 09/22/2016  . Bulging discs    lumbar   . Cancer ([place])    melanoma  . Complication of anesthesia    pt states has choking sensation with ET tube   . Coronary artery disease   . Depression   . Diabetes mellitus    diet controlled/on meds  . Fibromyalgia   . GERD (gastroesophageal reflux disease)   . H/O hiatal hernia   . Headache(784.0)    "recurring"  . High cholesterol   . Hyperlipidemia   . Hypertension   . 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 Migraines    "til ~ 1980"  . PONV (postoperative nausea and vomiting)   . Restless leg syndrome   . Sciatic nerve pain    "from pinched nerve"  . Sleep apnea    uses CPAP  . Weakness of right side of body    "I've had PT for it; they don't know what it's from".  CORTISONE INJECTION INTO BACK 08/30/12    Past Surgical History:  Procedure Laterality Date  . [place] STUDY N/A 12/29/2015   Procedure: [place] STUDY;  Surgeon: Manus Gunning, MD;  Location: [facility];  Service: Gastroenterology;  Laterality: N/A;  . CARPAL TUNNEL RELEASE Left 07/02/2015   Procedure: CARPAL TUNNEL RELEASE;   Surgeon: Frederik Pear, MD;  Location: [place];  Service: Orthopedics;  Laterality: Left;  . CATARACT EXTRACTION, BILATERAL Bilateral    Oct and Nov 2017  . CORONARY ANGIOPLASTY WITH STENT PLACEMENT  06/2011   "1"  . CORONARY ARTERY BYPASS GRAFT  2005   CABG X 2  . DILATION AND CURETTAGE OF UTERUS     "more than once"  . ELBOW ARTHROSCOPY Left 07/02/2015   Procedure: ARTHROSCOPY LEFT ELBOW WITH DEBRIDEMENT AND REMOVAL LOOSE BODY;  Surgeon: Frederik Pear, MD;  Location: [place];  Service: Orthopedics;  Laterality: Left;  . ESOPHAGEAL MANOMETRY N/A 12/29/2015   Procedure: ESOPHAGEAL MANOMETRY (EM);  Surgeon: Manus Gunning, MD;  Location: [facility];  Service: Gastroenterology;  Laterality: N/A;  . FRACTURE SURGERY  ~ 2005   nose  . KNEE ARTHROSCOPY  09/04/2012   Procedure: ARTHROSCOPY KNEE;  Surgeon: Gearlean Alf, MD;  Location: [facility];  Service: Orthopedics;  Laterality: Right;  right knee arthroscopy with medial and lateral meniscus debridement  . 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 MOUTH SURGERY  2004   "bone replacement;  had cadavear bones put in; face was collapsing"  . NASAL SEPTUM SURGERY  ~ 1986  . TOTAL KNEE ARTHROPLASTY Right 07/07/2013   Procedure: RIGHT TOTAL KNEE ARTHROPLASTY;  Surgeon: Gearlean Alf, MD;  Location: [facility];  Service: Orthopedics;  Laterality: Right;    There were no vitals filed for this visit.  Subjective Assessment - 01/02/19 1442    Subjective  Pt reports she is doing well today. She is performing her HEP.  No specific questions or concerns at this time.     Pertinent History  On 08/03/18 pt was carrying a folding table and fell and right arm was under the handle got stuck between the door and the table. She landed on her R side. Tried to get up and had to pull at her arm. Pt had immediate pain in her right shoulder. She waiteduntil Monday and went to see Dr. Edilia Bo her PCP. He took  radiographs but didn't find any fractures. She continued to have pain so she called Dr. Damita Dunnings office, her orthopedist, and saw him that same week. He took additional radiographs and found a hairline fracture in her humeral shaft and a greater tuberosity fracture per patient report. She states that she is not healing well and has recurrent plain film radiographs. She is still wearing a sling and was encouraged not to perform any AROM of her right shoulder. Since her last visit with Dr. Mayer Camel on 09/20/18 she has had an additional fall and landed on her R shoulder. She reports additional bruising and pain in her R shoulder. She has a follow-up appt scheduled with her orthopedist for 10/15/18. 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 ROS negative for red flags.    Limitations  Lifting    Diagnostic tests  Plain film radiographs with decreased healing per pt report    Patient Stated Goals  Improve ability to use RUE and decrease pain    Currently in Pain?  No/denies    Pain Score  0-No pain    Pain Onset  1 to 4 weeks ago       TREATMENT  Ther-ex UBE x 2 mins fwd , 2 mins bwd Supine protraction with 2 lbs x 20  Chin tucks x 5 with 10 sec hold Scapula retraction with YTB x 10 with 5 sec hold Bench press with 2 lbs x 20  Supine horizontal abd/add with 2 lbs x 20  Supine shoulder flex with elbows straight with 2 lbs Standing UE ranger for flexion, scaption and CW/CCW circles x 15 each direction; Wall push-ups 2 x 10 with therapist challenging pt to place feet slightly farther from wall; Standing wall slides for low trap activation 2 x 10; Standing R shoulder scaption x 10 each withtactile and verbal feedback. More limitation noted in scaptionand scapular assist as well as passive assist providedintermittently; Supine R shoulder flexion with 1# weight to available end range x 10; L sidelying R shoulder abduction without weight and pt requiring assist today x 10; L  sidelying R shoulder ER without weight and pt requiring assist today x 10, challenging for the patient;  Pt educated throughout session about proper posture and technique with exercises.                        PT Education - 01/02/19 1442    Education Details  HEP    Person(s) Educated  Patient    Methods  Explanation    Comprehension  Verbalized understanding;Returned demonstration;Need further instruction       PT Short Term Goals - 12/23/18 0956      PT SHORT TERM GOAL #1   Title  Pt will be independent with HEP in order to improve strength and decrease pain in order to improve pain-free function at home.    Time  4    Period  Weeks    Status  Achieved        PT Long Term Goals - 12/23/18 0956      PT LONG TERM GOAL #1   Title  Pt will decrease quick DASH score by at least 8% in order to demonstrate clinically significant reduction in disability.    Baseline  09/30/18: 84.1%; 10/30/18: 56.8%; 11/27/18: 45.45%; 12/23/18: 40.9%    Time  8    Period  Weeks    Status  Achieved      PT LONG TERM GOAL #2   Title  Pt will decrease worst pain as reported on NPRS by at least 3 points in order to demonstrate clinically significant reduction in pain.    Baseline  09/30/18: worst: 9/10; 10/30/18: 6/10;    Time  8    Period  Weeks    Status  Achieved      PT LONG TERM GOAL #3   Title  Pt will improve PROM of R shoulder to within 10 degrees of R shoulder in order to improve functional use of R shoulder for ADLs/IADLs.    Baseline  R/L 129/150 Shoulder flexion, 100/165 Shoulder abduction, 50/93 Shoulder external rotation, 50/70 Shoulder internal rotation; 10/30/18: R/L 140/150 Shoulder flexion, 132/165 Shoulder abduction, 82/93 Shoulder external rotation, 70/70 Shoulder internal rotation; 12/23/18:  R/L 155/150 Shoulder flexion, 134/165 Shoulder abduction, 89/93 Shoulder external rotation, 70/70 Shoulder internal rotation;    Time  8    Period  Weeks    Status   Partially Met    Target Date  01/22/19      PT LONG TERM GOAL #4   Title  Pt will decrease worst pain as reported on NPRS by at least 3 points (3 or less) in order to demonstrate clinically significant reduction in pain.    Baseline  10/30/18: 6/10; 11/27/18: 4/10; 12/23/18: 5/10 (improved pain frequency)    Time  8    Period  Weeks    Status  Partially Met    Target Date  01/22/19      PT LONG TERM GOAL #5   Title  Pt will decrease quick DASH score by at least 8% in order to demonstrate clinically significant reduction in disability.    Baseline  11/27/18: 45.45%; 12/23/18: 40.9%    Time  8    Period  Weeks    Status  Partially Met    Target Date  01/22/19      Additional Long Term Goals   Additional Long Term Goals  Yes      PT LONG TERM GOAL #6   Title  Pt will improve pain-free R shoulder strength for flexion and abduction so that it is symmetrical with L shoulder in order to return to full function at home    Baseline  12/23/18: R/L 4-/4+ Shoulder flexion, 4/4+ Shoulder abduction, both painful on R side    Time  8    Period  Weeks    Status  New    Target Date  01/22/19  Plan - 01/02/19 1443    Clinical Impression Statement  Pt requires direction and verbal cues for correct performance of exercises. Patient demonstrates weakness in RUE and performs open and closed chain exercises with no reports of pain. Pt was able to perform all exercises with min assist and VC for technique  Pt encouraged continuing  HEP .Follow-up as scheduled.    Rehab Potential  Good    PT Frequency  2x / week    PT Duration  8 weeks    PT Treatment/Interventions  ADLs/Self Care Home Management;Aquatic Therapy;Canalith Repostioning;Cryotherapy;Electrical Stimulation;Iontophoresis '4mg'$ /ml Dexamethasone;Moist Heat;Traction;Ultrasound;Contrast Bath;DME Instruction;Gait training;Stair training;Functional mobility training;Therapeutic activities;Therapeutic exercise;Balance training;Neuromuscular  re-education;Patient/family education;Manual techniques;Passive range of motion;Dry needling;Splinting;Taping;Vestibular    PT Next Visit Plan  Progress note, Review HEP and progress as needed, Progress AAROM/AROM/strengthening of shoulder as tolerated without pain    PT Home Exercise Plan  Cervical retractions, Scapular retractions, R upper trap stretch, supine canes for AAROM flexion with hands together progressing to AROM supine flexion, AROM sidelying shoulder abduction, AROM sidelying shoulder ER, seated pulleys for flexion and scaption ([id number])    Consulted and Agree with Plan of Care  Patient       Patient will benefit from skilled therapeutic intervention in order to improve the following deficits and impairments:  Decreased strength, Decreased range of motion, Impaired UE functional use, Pain  Visit Diagnosis: Acute pain of right shoulder  Stiffness of right shoulder, not elsewhere classified  Muscle weakness (generalized)     Problem List Patient Active Problem List   Diagnosis Date Noted  . Malignant melanoma ([place]) 11/23/2016  . Urge incontinence 11/23/2016  . Atypical mole 09/22/2016  . Slurring of speech 05/12/2016  . Traumatic arthritis elbow 05/04/2015  . Left foot pain 12/31/2014  . Severe sprain of left ankle 12/31/2014  . Osteopenia 09/15/2014  . History of TIA (transient ischemic attack) 05/19/2014  . Fracture of coronoid process of ulna, left, closed 04/25/2014  . Ingrown toenail 01/15/2014  . Obesity (BMI 30-39.9) 10/08/2013  . OA (osteoarthritis) of knee 07/07/2013  . Chronic low back pain 05/09/2013  . Obstructive sleep apnea 05/09/2013  . GERD (gastroesophageal reflux disease) 05/09/2013  . Hiatal hernia 05/09/2013  . Fibromyalgia syndrome 05/09/2013  . Depression, major, in partial remission ([place]) 05/09/2013  . RLS (restless legs syndrome) 05/09/2013  . Diabetes mellitus with no complication ([place]) 88/28/0034  . High cholesterol 05/09/2013  .  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 Family history of colon cancer 05/09/2013  . Allergic rhinitis 05/09/2013  . Lateral meniscal tear 09/04/2012  . CAD (coronary artery disease) 09/27/2011  . HTN (hypertension) 09/27/2011  . Paroxysmal A-fib [facility]) 09/27/2011    Alanson Puls, PT DPT 01/02/2019, 3:10 PM  [place] MAIN [facility] SERVICES [address] [place], [place], [address] Phone: [phone]   Fax:  [phone]  Name: Morgan [name] MRN: [id number] Date of Birth: 08-31-1941

## 2019-01-02 NOTE — Assessment & Plan Note (Signed)
At goal  < 70 on lipitor  CAD followed by Dr. Marlou Porch

## 2019-01-02 NOTE — Assessment & Plan Note (Signed)
Stable followed by cardiology

## 2019-01-03 ENCOUNTER — Other Ambulatory Visit: Payer: Self-pay | Admitting: Family Medicine

## 2019-01-03 NOTE — Telephone Encounter (Signed)
Pt never returned call here however she was seen, evaluated and treated as documented below.  Editor: Jinny Sanders, MD (Physician)    Elevated and pt with retuning SOB. Not sure why she stopped chlorthalidone. No reason in cardiology last note, the thought she was on it. It seemed to help with past SOB. Restart chlorthalidone at previous dose. Follow BP at home... goal < 140/90

## 2019-01-07 ENCOUNTER — Ambulatory Visit: Payer: Medicare Other

## 2019-01-07 DIAGNOSIS — M6281 Muscle weakness (generalized): Secondary | ICD-10-CM

## 2019-01-07 DIAGNOSIS — M25611 Stiffness of right shoulder, not elsewhere classified: Secondary | ICD-10-CM

## 2019-01-07 DIAGNOSIS — M25511 Pain in right shoulder: Secondary | ICD-10-CM | POA: Diagnosis not present

## 2019-01-07 NOTE — Therapy (Signed)
[place] [facility] SERVICES [address] [place], [place], [address] Phone: [phone]   Fax:  [phone]  Physical Therapy Treatment  Patient Details  Name: Morgan [name] MRN: [id number] Date of Birth: [birth year] Referring Provider (PT): Dr. Mayer Camel   Encounter Date: 01/07/2019  PT End of Session - 01/07/19 0859    Visit Number  22    Number of Visits  33    Date for PT Re-Evaluation  01/22/19    Authorization Type  Last goals: 12/23/18, 09/30/18 (eval)    PT Start Time  0855    PT Stop Time  0930    PT Time Calculation (min)  35 min    Activity Tolerance  Patient limited by pain    Behavior During Therapy  [facility] for tasks assessed/performed       Past Medical History:  Diagnosis Date  . Angina   . Arthritis   . Atrial fibrillation (HCC)    ASPIRIN FOR BLOOD THINNER  . Atypical mole 09/22/2016  . Bulging discs    lumbar   . Cancer ([place])    melanoma  . Complication of anesthesia    pt states has choking sensation with ET tube   . Coronary artery disease   . Depression   . Diabetes mellitus    diet controlled/on meds  . Fibromyalgia   . GERD (gastroesophageal reflux disease)   . H/O hiatal hernia   . Headache(784.0)    "recurring"  . High cholesterol   . Hyperlipidemia   . Hypertension   . 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 Migraines    "til ~ 1980"  . PONV (postoperative nausea and vomiting)   . Restless leg syndrome   . Sciatic nerve pain    "from pinched nerve"  . Sleep apnea    uses CPAP  . Weakness of right side of body    "I've had PT for it; they don't know what it's from".  CORTISONE INJECTION INTO BACK 08/30/12    Past Surgical History:  Procedure Laterality Date  . [place] STUDY N/A 12/29/2015   Procedure: [place] STUDY;  Surgeon: Manus Gunning, MD;  Location: [facility];  Service: Gastroenterology;  Laterality: N/A;  . CARPAL TUNNEL RELEASE Left 07/02/2015   Procedure: CARPAL TUNNEL RELEASE;   Surgeon: Frederik Pear, MD;  Location: [place];  Service: Orthopedics;  Laterality: Left;  . CATARACT EXTRACTION, BILATERAL Bilateral    Oct and Nov 2017  . CORONARY ANGIOPLASTY WITH STENT PLACEMENT  06/2011   "1"  . CORONARY ARTERY BYPASS GRAFT  2005   CABG X 2  . DILATION AND CURETTAGE OF UTERUS     "more than once"  . ELBOW ARTHROSCOPY Left 07/02/2015   Procedure: ARTHROSCOPY LEFT ELBOW WITH DEBRIDEMENT AND REMOVAL LOOSE BODY;  Surgeon: Frederik Pear, MD;  Location: [place];  Service: Orthopedics;  Laterality: Left;  . ESOPHAGEAL MANOMETRY N/A 12/29/2015   Procedure: ESOPHAGEAL MANOMETRY (EM);  Surgeon: Manus Gunning, MD;  Location: [facility];  Service: Gastroenterology;  Laterality: N/A;  . FRACTURE SURGERY  ~ 2005   nose  . KNEE ARTHROSCOPY  09/04/2012   Procedure: ARTHROSCOPY KNEE;  Surgeon: Gearlean Alf, MD;  Location: [facility];  Service: Orthopedics;  Laterality: Right;  right knee arthroscopy with medial and lateral meniscus debridement  . 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 MOUTH SURGERY  2004   "bone replacement;  had cadavear bones put in; face was collapsing"  . NASAL SEPTUM SURGERY  ~ 1986  . TOTAL KNEE ARTHROPLASTY Right 07/07/2013   Procedure: RIGHT TOTAL KNEE ARTHROPLASTY;  Surgeon: Gearlean Alf, MD;  Location: [facility];  Service: Orthopedics;  Laterality: Right;    There were no vitals filed for this visit.  Subjective Assessment - 01/07/19 0858    Subjective  Pt reports she is doing well today. She is performing her HEP.  No specific questions or concerns at this time.     Pertinent History  On 08/03/18 pt was carrying a folding table and fell and right arm was under the handle got stuck between the door and the table. She landed on her R side. Tried to get up and had to pull at her arm. Pt had immediate pain in her right shoulder. She waiteduntil Monday and went to see Dr. Edilia Bo her PCP. He took  radiographs but didn't find any fractures. She continued to have pain so she called Dr. Damita Dunnings office, her orthopedist, and saw him that same week. He took additional radiographs and found a hairline fracture in her humeral shaft and a greater tuberosity fracture per patient report. She states that she is not healing well and has recurrent plain film radiographs. She is still wearing a sling and was encouraged not to perform any AROM of her right shoulder. Since her last visit with Dr. Mayer Camel on 09/20/18 she has had an additional fall and landed on her R shoulder. She reports additional bruising and pain in her R shoulder. She has a follow-up appt scheduled with her orthopedist for 10/15/18. 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 ROS negative for red flags.    Limitations  Lifting    Diagnostic tests  Plain film radiographs with decreased healing per pt report    Patient Stated Goals  Improve ability to use RUE and decrease pain    Currently in Pain?  No/denies    Pain Onset  --         TREATMENT   Ther-ex UBE 60mn forward/261m backwards for warm-up during history (3 minutes unbilled); Standing UE ranger for flexion, scaption and CW/CCW circles x 10 each direction; Standing R shoulder AROM flexion and scaption x 10 each withtactile and verbal feedback. More limitation noted in scaptionand scapular assist as well as passive assist providedintermittently; Wall push-ups x 10 with therapist challenging pt to place feet slightly farther from wall, progressed to incline mat table push-up x 10; Standing wall slides for low trap activation 2 x 10; PROM R shoulder flexion, scaption, ER, and IR with end range holds; Supine R shoulder serratus punch with 1# dumbbell (DB) x 10; Supine R shoulder circles at 90 degrees of flexion with 1# DB CW x 15/CCW x 15; Supine R shoulder flexion with 1# weight to available end range x 10; L sidelying R shoulder abduction with 1# DB x  10; L sidelying R shoulder ER with 1# DB x 10;   Pt educated throughout session about proper posture and technique with exercises. Improved exercise technique, movement at target joints, use of target muscles after min to mod verbal, visual, tactile cues.    Pt demonstrates good motivation during session.She is improving with respect to her R shoulder strength against gravity especially with flexion. Abduction however is also improved as pt is better able to perform sidelying R shoulder abduction with 1# dumbbell. She does well with exercises today  reporting only minimal increase in pain at end range. Ptencouraged to follow-up as scheduledand continue HEP.Pt will benefit from PT services to address deficits in strength,mobility, and painin order to return to full function at home.                        PT Short Term Goals - 12/23/18 0956      PT SHORT TERM GOAL #1   Title  Pt will be independent with HEP in order to improve strength and decrease pain in order to improve pain-free function at home.    Time  4    Period  Weeks    Status  Achieved        PT Long Term Goals - 12/23/18 0956      PT LONG TERM GOAL #1   Title  Pt will decrease quick DASH score by at least 8% in order to demonstrate clinically significant reduction in disability.    Baseline  09/30/18: 84.1%; 10/30/18: 56.8%; 11/27/18: 45.45%; 12/23/18: 40.9%    Time  8    Period  Weeks    Status  Achieved      PT LONG TERM GOAL #2   Title  Pt will decrease worst pain as reported on NPRS by at least 3 points in order to demonstrate clinically significant reduction in pain.    Baseline  09/30/18: worst: 9/10; 10/30/18: 6/10;    Time  8    Period  Weeks    Status  Achieved      PT LONG TERM GOAL #3   Title  Pt will improve PROM of R shoulder to within 10 degrees of R shoulder in order to improve functional use of R shoulder for ADLs/IADLs.    Baseline  R/L 129/150 Shoulder flexion, 100/165  Shoulder abduction, 50/93 Shoulder external rotation, 50/70 Shoulder internal rotation; 10/30/18: R/L 140/150 Shoulder flexion, 132/165 Shoulder abduction, 82/93 Shoulder external rotation, 70/70 Shoulder internal rotation; 12/23/18:  R/L 155/150 Shoulder flexion, 134/165 Shoulder abduction, 89/93 Shoulder external rotation, 70/70 Shoulder internal rotation;    Time  8    Period  Weeks    Status  Partially Met    Target Date  01/22/19      PT LONG TERM GOAL #4   Title  Pt will decrease worst pain as reported on NPRS by at least 3 points (3 or less) in order to demonstrate clinically significant reduction in pain.    Baseline  10/30/18: 6/10; 11/27/18: 4/10; 12/23/18: 5/10 (improved pain frequency)    Time  8    Period  Weeks    Status  Partially Met    Target Date  01/22/19      PT LONG TERM GOAL #5   Title  Pt will decrease quick DASH score by at least 8% in order to demonstrate clinically significant reduction in disability.    Baseline  11/27/18: 45.45%; 12/23/18: 40.9%    Time  8    Period  Weeks    Status  Partially Met    Target Date  01/22/19      Additional Long Term Goals   Additional Long Term Goals  Yes      PT LONG TERM GOAL #6   Title  Pt will improve pain-free R shoulder strength for flexion and abduction so that it is symmetrical with L shoulder in order to return to full function at home    Baseline  12/23/18: R/L 4-/4+ Shoulder flexion, 4/4+  Shoulder abduction, both painful on R side    Time  8    Period  Weeks    Status  New    Target Date  01/22/19            Plan - 01/07/19 0859    Clinical Impression Statement  Pt demonstrates good motivation during session.She is improving with respect to her R shoulder strength against gravity especially with flexion. Abduction however is also improved as pt is better able to perform sidelying R shoulder abduction with 1# dumbbell. She does well with exercises today reporting only minimal increase in pain at end range.  Ptencouraged to follow-up as scheduledand continue HEP.Pt will benefit from PT services to address deficits in strength,mobility, and painin order to return to full function at home.    Rehab Potential  Good    PT Frequency  2x / week    PT Duration  8 weeks    PT Treatment/Interventions  ADLs/Self Care Home Management;Aquatic Therapy;Canalith Repostioning;Cryotherapy;Electrical Stimulation;Iontophoresis '4mg'$ /ml Dexamethasone;Moist Heat;Traction;Ultrasound;Contrast Bath;DME Instruction;Gait training;Stair training;Functional mobility training;Therapeutic activities;Therapeutic exercise;Balance training;Neuromuscular re-education;Patient/family education;Manual techniques;Passive range of motion;Dry needling;Splinting;Taping;Vestibular    PT Next Visit Plan  Progress note, Review HEP and progress as needed, Progress AAROM/AROM/strengthening of shoulder as tolerated without pain    PT Home Exercise Plan  Cervical retractions, Scapular retractions, R upper trap stretch, supine canes for AAROM flexion with hands together progressing to AROM supine flexion, AROM sidelying shoulder abduction, AROM sidelying shoulder ER, seated pulleys for flexion and scaption ([id number])    Consulted and Agree with Plan of Care  Patient       Patient will benefit from skilled therapeutic intervention in order to improve the following deficits and impairments:  Decreased strength, Decreased range of motion, Impaired UE functional use, Pain  Visit Diagnosis: Acute pain of right shoulder  Stiffness of right shoulder, not elsewhere classified  Muscle weakness (generalized)     Problem List Patient Active Problem List   Diagnosis Date Noted  . Malignant melanoma ([place]) 11/23/2016  . Urge incontinence 11/23/2016  . Atypical mole 09/22/2016  . Slurring of speech 05/12/2016  . Traumatic arthritis elbow 05/04/2015  . Left foot pain 12/31/2014  . Severe sprain of left ankle 12/31/2014  . Osteopenia 09/15/2014  .  History of TIA (transient ischemic attack) 05/19/2014  . Fracture of coronoid process of ulna, left, closed 04/25/2014  . Ingrown toenail 01/15/2014  . Obesity (BMI 30-39.9) 10/08/2013  . OA (osteoarthritis) of knee 07/07/2013  . Chronic low back pain 05/09/2013  . Obstructive sleep apnea 05/09/2013  . GERD (gastroesophageal reflux disease) 05/09/2013  . Hiatal hernia 05/09/2013  . Fibromyalgia syndrome 05/09/2013  . Depression, major, in partial remission ([place]) 05/09/2013  . RLS (restless legs syndrome) 05/09/2013  . Diabetes mellitus with no complication ([place]) 61/95/0932  . High cholesterol 05/09/2013  . 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 Family history of colon cancer 05/09/2013  . Allergic rhinitis 05/09/2013  . Lateral meniscal tear 09/04/2012  . CAD (coronary artery disease) 09/27/2011  . HTN (hypertension) 09/27/2011  . Paroxysmal A-fib ([place]) 09/27/2011    Olamae Ferrara 01/07/2019, 3:32 PM  [place] [facility] SERVICES [address] [place], [place], [address] Phone: [phone]   Fax:  [phone]  Name: Morgan [name] MRN: [id number] Date of Birth: 07-05-1941

## 2019-01-14 ENCOUNTER — Ambulatory Visit: Payer: Medicare Other | Admitting: Family Medicine

## 2019-01-14 ENCOUNTER — Ambulatory Visit: Payer: Medicare Other

## 2019-01-14 ENCOUNTER — Other Ambulatory Visit: Payer: Self-pay | Admitting: Family Medicine

## 2019-01-14 ENCOUNTER — Encounter: Payer: Self-pay | Admitting: Family Medicine

## 2019-01-14 DIAGNOSIS — I10 Essential (primary) hypertension: Secondary | ICD-10-CM

## 2019-01-14 DIAGNOSIS — E119 Type 2 diabetes mellitus without complications: Secondary | ICD-10-CM | POA: Diagnosis not present

## 2019-01-14 DIAGNOSIS — Z1231 Encounter for screening mammogram for malignant neoplasm of breast: Secondary | ICD-10-CM

## 2019-01-14 DIAGNOSIS — K529 Noninfective gastroenteritis and colitis, unspecified: Secondary | ICD-10-CM

## 2019-01-14 NOTE — Assessment & Plan Note (Signed)
No red flags such as blood in stool.. pt due for routine repeat colonoscopy later this year.  no clear infectious cause.  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 IBS vs secondary to metformin (has been on for a long time) Will first try to eliminate lactose from diet, if not effected decrease metformin.  If not improving may need lab eval or GI referral.

## 2019-01-14 NOTE — Assessment & Plan Note (Signed)
If  Needing to decrease metformin.[name] she will increase back to previous dose of victoza 1.8 mcg daily.

## 2019-01-14 NOTE — Progress Notes (Signed)
Subjective:    Patient ID: Morgan [name], female    DOB: 12/27/1940, 78 y.o.   MRN: [id number]  HPI   78 year old female with HTN, family history of colon cancer  present for follow up.. she has been struggling with daily diarrhea for 4-5 months, but getting worse and more frequent.  Has 4-5 BMs a day.. had BM every time after eating.  Occ violent diarrhea, expolosive diarrhea, occ incontinence.   No abdominal pain associated... does feel fullness. Relieved with BM No blood in stool.  Using immodium occ. Probiotic did not help much in past.  Seems to be worse with milk.  She is on metformin 1000 mg BID. Lab Results  Component Value Date   HGBA1C 6.3 12/23/2018     Last colonoscopy 2015 sessile polyps.. due for repeat 10/2019 with Dr Deatra Ina   SOB has improved significantly back on chlorthalidone.   BP well controlled here.. but in AMs.. BP running 96-112/ 48-68  She feels bad in morning when BP is low. Social History /Family History/Past Medical History reviewed in detail and updated in EMR if needed. Blood pressure 138/66, pulse 74, temperature 97.8 F (36.6 C), height 5' 2.25" (1.581 m), weight 148 lb 6.4 oz (67.3 kg), SpO2 96 %.  Review of Systems  Constitutional: Negative for fatigue and fever.  HENT: Negative for congestion.   Eyes: Negative for pain.  Respiratory: Negative for cough and shortness of breath.   Cardiovascular: Negative for chest pain, palpitations and leg swelling.  Gastrointestinal: Positive for abdominal distention and diarrhea. Negative for abdominal pain, blood in stool, constipation, nausea, rectal pain and vomiting.  Genitourinary: Negative for dysuria and vaginal bleeding.  Musculoskeletal: Negative for back pain.  Neurological: Positive for light-headedness. Negative for syncope and headaches.  Psychiatric/Behavioral: Negative for dysphoric mood.       Objective:   Physical Exam Constitutional:      General: She is not in acute  distress.    Appearance: Normal appearance. She is well-developed. She is not ill-appearing or toxic-appearing.  HENT:     Head: Normocephalic.     Right Ear: Hearing, tympanic membrane, ear canal and external ear normal. Tympanic membrane is not erythematous, retracted or bulging.     Left Ear: Hearing, tympanic membrane, ear canal and external ear normal. Tympanic membrane is not erythematous, retracted or bulging.     Nose: No mucosal edema or rhinorrhea.     Right Sinus: No maxillary sinus tenderness or frontal sinus tenderness.     Left Sinus: No maxillary sinus tenderness or frontal sinus tenderness.     Mouth/Throat:     Pharynx: Uvula midline.  Eyes:     General: Lids are normal. Lids are everted, no foreign bodies appreciated.     Conjunctiva/sclera: Conjunctivae normal.     Pupils: Pupils are equal, round, and reactive to light.  Neck:     Musculoskeletal: Normal range of motion and neck supple.     Thyroid: No thyroid mass or thyromegaly.     Vascular: No carotid bruit.     Trachea: Trachea normal.  Cardiovascular:     Rate and Rhythm: Normal rate and regular rhythm.     Pulses: Normal pulses.     Heart sounds: Normal heart sounds, S1 normal and S2 normal. No murmur. No friction rub. No gallop.   Pulmonary:     Effort: Pulmonary effort is normal. No tachypnea or respiratory distress.     Breath sounds: Normal breath sounds. No  decreased breath sounds, wheezing, rhonchi or rales.  Abdominal:     General: Bowel sounds are normal.     Palpations: Abdomen is soft.     Tenderness: There is no abdominal tenderness.  Skin:    General: Skin is warm and dry.     Findings: No rash.  Neurological:     Mental Status: She is alert.  Psychiatric:        Mood and Affect: Mood is not anxious or depressed.        Speech: Speech normal.        Behavior: Behavior normal. Behavior is cooperative.        Thought Content: Thought content normal.        Judgment: Judgment normal.            Assessment & Plan:

## 2019-01-14 NOTE — Assessment & Plan Note (Signed)
Additiona of chlorthalidone back to pt's regimen has dropped her AM BPs low and she is symptomatic.  She has noted significnat improvement in in her SOB as before though.  We will have her try 12.5 mg daily ( 1/2 tabs) to se if benefit achieved and SE avoided.

## 2019-01-14 NOTE — Patient Instructions (Addendum)
Decrease chlorthalidone to 1/2 tab daily.  Follow BP in morning.. call if still running low and feeling bad in AMs with lowBP.  Keep up with fluids. Eliminate lactose from diet for 2 weeks.  Can increase fiber in diet and water. Avoid greasy foods.  If diarrhea not better.. decrease metformin to 1 tab twice daily for 2 weeks to see if help diarrhea. If you do this increase victoza back up to 1.8 mcg daily.

## 2019-01-16 ENCOUNTER — Ambulatory Visit: Payer: Medicare Other

## 2019-01-21 ENCOUNTER — Ambulatory Visit: Payer: Medicare Other | Attending: Orthopedic Surgery

## 2019-01-21 DIAGNOSIS — M25611 Stiffness of right shoulder, not elsewhere classified: Secondary | ICD-10-CM | POA: Insufficient documentation

## 2019-01-21 DIAGNOSIS — M25511 Pain in right shoulder: Secondary | ICD-10-CM | POA: Insufficient documentation

## 2019-01-21 DIAGNOSIS — M6281 Muscle weakness (generalized): Secondary | ICD-10-CM | POA: Insufficient documentation

## 2019-01-21 NOTE — Therapy (Signed)
[place] MAIN [facility] SERVICES [address] [place], [place], [address] Phone: [phone]   Fax:  [phone]  Physical Therapy Treatment/Recertification  Patient Details  Name: Morgan [name] MRN: [id number] Date of Birth: 05-04-1941 Referring Provider (PT): Dr. Mayer Camel   Encounter Date: 01/21/2019  PT End of Session - 01/21/19 1117    Visit Number  23    Number of Visits  49    Date for PT Re-Evaluation  03/18/19    Authorization Type  Last goals: 12/23/18, updated today 01/21/19,  09/30/18 (eval)    PT Start Time  1116    PT Stop Time  1200    PT Time Calculation (min)  44 min    Activity Tolerance  Patient limited by pain    Behavior During Therapy  [facility] for tasks assessed/performed       Past Medical History:  Diagnosis Date  . Angina   . Arthritis   . Atrial fibrillation (HCC)    ASPIRIN FOR BLOOD THINNER  . Atypical mole 09/22/2016  . Bulging discs    lumbar   . Cancer ([place])    melanoma  . Complication of anesthesia    pt states has choking sensation with ET tube   . Coronary artery disease   . Depression   . Diabetes mellitus    diet controlled/on meds  . Fibromyalgia   . GERD (gastroesophageal reflux disease)   . H/O hiatal hernia   . Headache(784.0)    "recurring"  . High cholesterol   . Hyperlipidemia   . Hypertension   . 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 Migraines    "til ~ 1980"  . PONV (postoperative nausea and vomiting)   . Restless leg syndrome   . Sciatic nerve pain    "from pinched nerve"  . Sleep apnea    uses CPAP  . Weakness of right side of body    "I've had PT for it; they don't know what it's from".  CORTISONE INJECTION INTO BACK 08/30/12    Past Surgical History:  Procedure Laterality Date  . [place] STUDY N/A 12/29/2015   Procedure: [place] STUDY;  Surgeon: Manus Gunning, MD;  Location: [facility];  Service: Gastroenterology;  Laterality: N/A;  . CARPAL TUNNEL RELEASE Left  07/02/2015   Procedure: CARPAL TUNNEL RELEASE;  Surgeon: Frederik Pear, MD;  Location: [place];  Service: Orthopedics;  Laterality: Left;  . CATARACT EXTRACTION, BILATERAL Bilateral    Oct and Nov 2017  . CORONARY ANGIOPLASTY WITH STENT PLACEMENT  06/2011   "1"  . CORONARY ARTERY BYPASS GRAFT  2005   CABG X 2  . DILATION AND CURETTAGE OF UTERUS     "more than once"  . ELBOW ARTHROSCOPY Left 07/02/2015   Procedure: ARTHROSCOPY LEFT ELBOW WITH DEBRIDEMENT AND REMOVAL LOOSE BODY;  Surgeon: Frederik Pear, MD;  Location: [place];  Service: Orthopedics;  Laterality: Left;  . ESOPHAGEAL MANOMETRY N/A 12/29/2015   Procedure: ESOPHAGEAL MANOMETRY (EM);  Surgeon: Manus Gunning, MD;  Location: [facility];  Service: Gastroenterology;  Laterality: N/A;  . FRACTURE SURGERY  ~ 2005   nose  . KNEE ARTHROSCOPY  09/04/2012   Procedure: ARTHROSCOPY KNEE;  Surgeon: Gearlean Alf, MD;  Location: [facility];  Service: Orthopedics;  Laterality: Right;  right knee arthroscopy with medial and lateral meniscus debridement  . 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 MOUTH SURGERY  2004   "  bone replacement; had cadavear bones put in; face was collapsing"  . NASAL SEPTUM SURGERY  ~ 1986  . TOTAL KNEE ARTHROPLASTY Right 07/07/2013   Procedure: RIGHT TOTAL KNEE ARTHROPLASTY;  Surgeon: Gearlean Alf, MD;  Location: [facility];  Service: Orthopedics;  Laterality: Right;    There were no vitals filed for this visit.  Subjective Assessment - 01/21/19 1117    Subjective  Pt reports she is doing well today. She is performing her HEP.  No specific questions or concerns at this time. She continues to struggle to get clothes up onto her upper clothes rack. Still experiences intermittent pain but frequency is decreasing.     Pertinent History  On 08/03/18 pt was carrying a folding table and fell and right arm was under the handle got stuck between the door and the table. She  landed on her R side. Tried to get up and had to pull at her arm. Pt had immediate pain in her right shoulder. She waiteduntil Monday and went to see Dr. Edilia Bo her PCP. He took radiographs but didn't find any fractures. She continued to have pain so she called Dr. Damita Dunnings office, her orthopedist, and saw him that same week. He took additional radiographs and found a hairline fracture in her humeral shaft and a greater tuberosity fracture per patient report. She states that she is not healing well and has recurrent plain film radiographs. She is still wearing a sling and was encouraged not to perform any AROM of her right shoulder. Since her last visit with Dr. Mayer Camel on 09/20/18 she has had an additional fall and landed on her R shoulder. She reports additional bruising and pain in her R shoulder. She has a follow-up appt scheduled with her orthopedist for 10/15/18. 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 ROS negative for red flags.    Limitations  Lifting    Diagnostic tests  Plain film radiographs with decreased healing per pt report    Patient Stated Goals  Improve ability to use RUE and decrease pain    Currently in Pain?  No/denies           TREATMENT   Ther-ex Warm-up on UBE 2 minutes forward/74mnutes backwards for AAROM during history with therapist monitoring for pain,  Updated outcome measures/goals with patient: Pt completed QuickDASH= 29.5% (unbilled); Bilateral shoulder PROM measures obtained: R/L 154/150 Shoulder flexion, 136/165 Shoulder abduction, 85/93 Shoulder external rotation, 70/70 Shoulder internal rotation;  HBH: R: T1, L: T5 HBB: R: T11, L: T7;  MMT:  R/L 4*/4+ Shoulder flexion (anterior deltoid/pec major/coracobrachialis, axillary n. (C5-6) and musculocutaneous n. (C5-7) 4*/4+ Shoulder abduction (deltoid/supraspinatus, axillary/suprascapular n, C5) 4+/4+ Shoulder external rotation (infraspinatus/teres minor) 4+/4+ Shoulder internal  rotation (subcapularis/lats/pec major) 4-/4- Shoulder extension (posterior deltoid, lats, teres major, axillary/thoracodorsal n.) 4-/4- Shoulder horizontal abduction 5/5 Elbow flexion (biceps brachii, brachialis, brachioradialis, musculoskeletal n, C5-6) 4+/4+ Elbow extension (triceps, radial n, C7) *Indicates pain  Prone R shoulder extension unweighted x 15; Prone R shoulder rows with 2# dumbbell (DB) x 15; Prone R shoulder horizontal abduction x 15; Prone R shoulder "Y's" unweighted x 15; L sidelying R shoulder abduction with 2# unweighted to available end range x 10, attempted with 1# DB but too painful so discontinued; L sidelying R shoulder ER with 1# DB 2 x 10, challenging for the patient; Supine R shoulder flexion with 1# weight to available end range x 10;    Pt educated throughout session about proper  posture and technique.Improved technique, movement at target joints, after min to mod verbal, visual, tactile cues.   Pt demonstrates good motivation during session. Her R shoulder AAROM/PROM appears to be grossly unchanged since the last time it was measured. Her most significant range of motion limitation is still in abduction. She also presents with weakness in R shoulder flexion, abduction, internal rotation, and horizontal abduction when compared to the left side. Her range of motion is grossly functional however but she continues to struggle with reaching into high cabinets/shelves especially putting clothing up on the high rack in her closet. Her QuickDASH has decreased from 84.1% in November 2019 to 29.5% today. In addition her worst shoulder pain has decreased from a 9/10 to a 5/10 and she is experiencing pain much less frequently. Ptencouraged to follow-up as scheduledand continue HEP.Pt will benefit from PT services to address deficits in strength,mobility, and painin order to return to full function at home.                         PT Short  Term Goals - 01/21/19 1123      PT SHORT TERM GOAL #1   Title  Pt will be independent with HEP in order to improve strength and decrease pain in order to improve pain-free function at home.    Time  4    Period  Weeks    Status  Achieved        PT Long Term Goals - 01/21/19 1123      PT LONG TERM GOAL #1   Title  Pt will decrease quick DASH score by at least 8% in order to demonstrate clinically significant reduction in disability.    Baseline  09/30/18: 84.1%; 10/30/18: 56.8%; 11/27/18: 45.45%; 12/23/18: 40.9%    Time  8    Period  Weeks    Status  Achieved      PT LONG TERM GOAL #2   Title  Pt will decrease worst pain as reported on NPRS by at least 3 points in order to demonstrate clinically significant reduction in pain.    Baseline  09/30/18: worst: 9/10; 10/30/18: 6/10;    Time  8    Period  Weeks    Status  Achieved      PT LONG TERM GOAL #3   Title  Pt will improve PROM of R shoulder to within 10 degrees of R shoulder in order to improve functional use of R shoulder for ADLs/IADLs.    Baseline  R/L 129/150 Shoulder flexion, 100/165 Shoulder abduction, 50/93 Shoulder external rotation, 50/70 Shoulder internal rotation; 10/30/18: R/L 140/150 Shoulder flexion, 132/165 Shoulder abduction, 82/93 Shoulder external rotation, 70/70 Shoulder internal rotation; 12/23/18:  R/L 155/150 Shoulder flexion, 134/165 Shoulder abduction, 89/93 Shoulder external rotation, 70/70 Shoulder internal rotation, 01/21/19: R/L 154/150 Shoulder flexion, 136/165 Shoulder abduction, 85/93 Shoulder external rotation, 70/70 Shoulder internal rotation    Time  8    Period  Weeks    Status  Partially Met    Target Date  03/18/19      PT LONG TERM GOAL #4   Title  Pt will decrease worst pain as reported on NPRS by at least 3 points (3 or less) in order to demonstrate clinically significant reduction in pain.    Baseline  10/30/18: 6/10; 11/27/18: 4/10; 12/23/18: 5/10 (improved pain frequency); 01/21/19: 5/10 (pain  is less frequent);    Time  8    Period  Weeks  Status  Partially Met    Target Date  03/18/19      PT LONG TERM GOAL #5   Title  Pt will decrease quick DASH score by at least 8% in order to demonstrate clinically significant reduction in disability.    Baseline  11/27/18: 45.45%; 12/23/18: 40.9%; 01/21/19: 29.5%    Time  8    Period  Weeks    Status  Achieved      PT LONG TERM GOAL #6   Title  Pt will improve pain-free R shoulder strength for flexion and abduction so that it is symmetrical with L shoulder in order to return to full function at home    Baseline  12/23/18: R/L 4-/4+ Shoulder flexion, 4/4+ Shoulder abduction, both painful on R side; 01/21/19: R/L 4/4+ Shoulder flexion, 4/4+ Shoulder abduction, both painful on R side    Time  8    Period  Weeks    Status  On-going    Target Date  03/18/19            Plan - 01/21/19 1120    Clinical Impression Statement  Pt demonstrates good motivation during session. Her R shoulder AAROM/PROM appears to be grossly unchanged since the last time it was measured. Her most significant range of motion limitation is still in abduction. She also presents with weakness in R shoulder flexion, abduction, internal rotation, and horizontal abduction when compared to the left side. Her range of motion is grossly functional however but she continues to struggle with reaching into high cabinets/shelves especially putting clothing up on the high rack in her closet. Her QuickDASH has decreased from 84.1% in November 2019 to 29.5% today. In addition her worst shoulder pain has decreased from a 9/10 to a 5/10 and she is experiencing pain much less frequently. Ptencouraged to follow-up as scheduledand continue HEP.Pt will benefit from PT services to address deficits in strength,mobility, and painin order to return to full function at home.    Rehab Potential  Good    PT Frequency  2x / week    PT Duration  8 weeks    PT Treatment/Interventions  ADLs/Self  Care Home Management;Aquatic Therapy;Canalith Repostioning;Cryotherapy;Electrical Stimulation;Iontophoresis '4mg'$ /ml Dexamethasone;Moist Heat;Traction;Ultrasound;Contrast Bath;DME Instruction;Gait training;Stair training;Functional mobility training;Therapeutic activities;Therapeutic exercise;Balance training;Neuromuscular re-education;Patient/family education;Manual techniques;Passive range of motion;Dry needling;Splinting;Taping;Vestibular    PT Next Visit Plan  Review HEP and progress as needed, Progress AAROM/AROM/strengthening of shoulder as tolerated without pain    PT Home Exercise Plan  Cervical retractions, Scapular retractions, R upper trap stretch, supine canes for AAROM flexion with hands together progressing to AROM supine flexion, AROM sidelying shoulder abduction, AROM sidelying shoulder ER, seated pulleys for flexion and scaption ([id number])    Consulted and Agree with Plan of Care  Patient       Patient will benefit from skilled therapeutic intervention in order to improve the following deficits and impairments:  Decreased strength, Decreased range of motion, Impaired UE functional use, Pain  Visit Diagnosis: Acute pain of right shoulder  Stiffness of right shoulder, not elsewhere classified  Muscle weakness (generalized)     Problem List Patient Active Problem List   Diagnosis Date Noted  . Chronic diarrhea 01/14/2019  . Malignant melanoma ([place]) 11/23/2016  . Urge incontinence 11/23/2016  . Atypical mole 09/22/2016  . Slurring of speech 05/12/2016  . Traumatic arthritis elbow 05/04/2015  . Left foot pain 12/31/2014  . Severe sprain of left ankle 12/31/2014  . Osteopenia 09/15/2014  . History of TIA (transient ischemic attack)  05/19/2014  . Fracture of coronoid process of ulna, left, closed 04/25/2014  . Ingrown toenail 01/15/2014  . Obesity (BMI 30-39.9) 10/08/2013  . OA (osteoarthritis) of knee 07/07/2013  . Chronic low back pain 05/09/2013  . Obstructive sleep  apnea 05/09/2013  . GERD (gastroesophageal reflux disease) 05/09/2013  . Hiatal hernia 05/09/2013  . Fibromyalgia syndrome 05/09/2013  . Depression, major, in partial remission ([place]) 05/09/2013  . RLS (restless legs syndrome) 05/09/2013  . Diabetes mellitus with no complication ([place]) 94/17/4081  . High cholesterol 05/09/2013  . 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 Family history of colon cancer 05/09/2013  . Allergic rhinitis 05/09/2013  . Lateral meniscal tear 09/04/2012  . CAD (coronary artery disease) 09/27/2011  . HTN (hypertension) 09/27/2011  . Paroxysmal A-fib ([place]) 09/27/2011   Phillips Grout PT, DPT, GCS  Huprich,Jason 01/21/2019, 9:57 PM  [place] MAIN [facility] SERVICES [address] [place], [place], [address] Phone: [phone]   Fax:  [phone]  Name: Morgan [name] MRN: [id number] Date of Birth: 11/14/1940

## 2019-01-23 ENCOUNTER — Other Ambulatory Visit: Payer: Self-pay

## 2019-01-23 ENCOUNTER — Ambulatory Visit: Payer: Medicare Other

## 2019-01-23 DIAGNOSIS — M6281 Muscle weakness (generalized): Secondary | ICD-10-CM

## 2019-01-23 DIAGNOSIS — M25511 Pain in right shoulder: Secondary | ICD-10-CM

## 2019-01-23 DIAGNOSIS — M25611 Stiffness of right shoulder, not elsewhere classified: Secondary | ICD-10-CM

## 2019-01-23 NOTE — Therapy (Signed)
[place] MAIN [facility] SERVICES [address] [place], [place], [address] Phone: [phone]   Fax:  [phone]  Physical Therapy Treatment  Patient Details  Name: Morgan [name] MRN: [id number] Date of Birth: 08-02-1941 Referring Provider (PT): Dr. Mayer Camel   Encounter Date: 01/23/2019  PT End of Session - 01/23/19 1048    Visit Number  24    Number of Visits  49    Date for PT Re-Evaluation  03/18/19    Authorization Type  Last goals: 01/21/19,  09/30/18 (eval)    PT Start Time  1042    PT Stop Time  1130    PT Time Calculation (min)  48 min    Activity Tolerance  Patient limited by pain    Behavior During Therapy  [facility] for tasks assessed/performed       Past Medical History:  Diagnosis Date  . Angina   . Arthritis   . Atrial fibrillation (HCC)    ASPIRIN FOR BLOOD THINNER  . Atypical mole 09/22/2016  . Bulging discs    lumbar   . Cancer ([place])    melanoma  . Complication of anesthesia    pt states has choking sensation with ET tube   . Coronary artery disease   . Depression   . Diabetes mellitus    diet controlled/on meds  . Fibromyalgia   . GERD (gastroesophageal reflux disease)   . H/O hiatal hernia   . Headache(784.0)    "recurring"  . High cholesterol   . Hyperlipidemia   . Hypertension   . 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 Migraines    "til ~ 1980"  . PONV (postoperative nausea and vomiting)   . Restless leg syndrome   . Sciatic nerve pain    "from pinched nerve"  . Sleep apnea    uses CPAP  . Weakness of right side of body    "I've had PT for it; they don't know what it's from".  CORTISONE INJECTION INTO BACK 08/30/12    Past Surgical History:  Procedure Laterality Date  . [place] STUDY N/A 12/29/2015   Procedure: [place] STUDY;  Surgeon: Manus Gunning, MD;  Location: [facility];  Service: Gastroenterology;  Laterality: N/A;  . CARPAL TUNNEL RELEASE Left 07/02/2015   Procedure: CARPAL TUNNEL RELEASE;   Surgeon: Frederik Pear, MD;  Location: [place];  Service: Orthopedics;  Laterality: Left;  . CATARACT EXTRACTION, BILATERAL Bilateral    Oct and Nov 2017  . CORONARY ANGIOPLASTY WITH STENT PLACEMENT  06/2011   "1"  . CORONARY ARTERY BYPASS GRAFT  2005   CABG X 2  . DILATION AND CURETTAGE OF UTERUS     "more than once"  . ELBOW ARTHROSCOPY Left 07/02/2015   Procedure: ARTHROSCOPY LEFT ELBOW WITH DEBRIDEMENT AND REMOVAL LOOSE BODY;  Surgeon: Frederik Pear, MD;  Location: [place];  Service: Orthopedics;  Laterality: Left;  . ESOPHAGEAL MANOMETRY N/A 12/29/2015   Procedure: ESOPHAGEAL MANOMETRY (EM);  Surgeon: Manus Gunning, MD;  Location: [facility];  Service: Gastroenterology;  Laterality: N/A;  . FRACTURE SURGERY  ~ 2005   nose  . KNEE ARTHROSCOPY  09/04/2012   Procedure: ARTHROSCOPY KNEE;  Surgeon: Gearlean Alf, MD;  Location: [facility];  Service: Orthopedics;  Laterality: Right;  right knee arthroscopy with medial and lateral meniscus debridement  . 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 MOUTH SURGERY  2004   "bone  replacement; had cadavear bones put in; face was collapsing"  . NASAL SEPTUM SURGERY  ~ 1986  . TOTAL KNEE ARTHROPLASTY Right 07/07/2013   Procedure: RIGHT TOTAL KNEE ARTHROPLASTY;  Surgeon: Gearlean Alf, MD;  Location: [facility];  Service: Orthopedics;  Laterality: Right;    There were no vitals filed for this visit.  Subjective Assessment - 01/23/19 1046    Subjective  Pt reports she is doing well today. She is performing her HEP.  No specific questions or concerns at this time.     Pertinent History  On 08/03/18 pt was carrying a folding table and fell and right arm was under the handle got stuck between the door and the table. She landed on her R side. Tried to get up and had to pull at her arm. Pt had immediate pain in her right shoulder. She waiteduntil Monday and went to see Dr. Edilia Bo her PCP. He took  radiographs but didn't find any fractures. She continued to have pain so she called Dr. Damita Dunnings office, her orthopedist, and saw him that same week. He took additional radiographs and found a hairline fracture in her humeral shaft and a greater tuberosity fracture per patient report. She states that she is not healing well and has recurrent plain film radiographs. She is still wearing a sling and was encouraged not to perform any AROM of her right shoulder. Since her last visit with Dr. Mayer Camel on 09/20/18 she has had an additional fall and landed on her R shoulder. She reports additional bruising and pain in her R shoulder. She has a follow-up appt scheduled with her orthopedist for 10/15/18. 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 ROS negative for red flags.    Limitations  Lifting    Diagnostic tests  Plain film radiographs with decreased healing per pt report    Patient Stated Goals  Improve ability to use RUE and decrease pain    Currently in Pain?  Yes    Pain Score  2     Pain Location  Shoulder    Pain Orientation  Right    Pain Descriptors / Indicators  Aching    Pain Type  Chronic pain    Pain Onset  1 to 4 weeks ago    Pain Frequency  Constant           TREATMENT   Ther-ex UBE 64mn forward/253m backwards for warm-up during history (3 minutes unbilled); Standing UE [place] for flexion, scaption and CW/CCW circles x 15 each direction; Standing rows with green tband x 15; Standing R shoulder extension with green tband x 15; Standing R shoulder fleixon with yellow tband x 15; Supine R shoulder serratus punch with 2# dumbbell (DB) x 10; Supine R shoulder circles at 90 degrees of flexion with 1# DB CW x 15/CCW x 15; Supine R shoulder flexion with 2# weight to available end range x 10; L sidelying R shoulder abduction with 1# DBx 10; L sidelying R shoulder ER with 1# DB x 10;    Manual Therapy  PROM R shoulder flexion, scaption, ER, and IR with end  range holds; Supine R shoulder inferior mobilizations at available end range scaption, grade III, 30s/bout x 3 bouts; Supine R shoulder posterior mobilizations at available end range pressing through elbow, grade III, 30s/bout x 3 bouts; Supine R shoulder A/P mobilizations at 90 abduction and end range ER, grade II-III, 30s/bouts x 3 bouts;   Pt educated throughout  session about proper posture and technique with exercises. Improved exercise technique, movement at target joints, use of target muscles after min to mod verbal, visual, tactile cues.    Pt demonstrates good motivation during session.She is improving with respect to her R shoulder strength against gravity especially with flexion and abdcution. She reports only minimal increase in pain at end range. Pt advised to add 1# to supine and sidelying flexion, abduction, and ER at home. Ptencouraged to follow-up as scheduledand continue HEP.Pt will benefit from PT services to address deficits in strength,mobility, and painin order to return to full function at home.                      PT Short Term Goals - 01/21/19 1123      PT SHORT TERM GOAL #1   Title  Pt will be independent with HEP in order to improve strength and decrease pain in order to improve pain-free function at home.    Time  4    Period  Weeks    Status  Achieved        PT Long Term Goals - 01/21/19 1123      PT LONG TERM GOAL #1   Title  Pt will decrease quick DASH score by at least 8% in order to demonstrate clinically significant reduction in disability.    Baseline  09/30/18: 84.1%; 10/30/18: 56.8%; 11/27/18: 45.45%; 12/23/18: 40.9%    Time  8    Period  Weeks    Status  Achieved      PT LONG TERM GOAL #2   Title  Pt will decrease worst pain as reported on NPRS by at least 3 points in order to demonstrate clinically significant reduction in pain.    Baseline  09/30/18: worst: 9/10; 10/30/18: 6/10;    Time  8    Period  Weeks     Status  Achieved      PT LONG TERM GOAL #3   Title  Pt will improve PROM of R shoulder to within 10 degrees of R shoulder in order to improve functional use of R shoulder for ADLs/IADLs.    Baseline  R/L 129/150 Shoulder flexion, 100/165 Shoulder abduction, 50/93 Shoulder external rotation, 50/70 Shoulder internal rotation; 10/30/18: R/L 140/150 Shoulder flexion, 132/165 Shoulder abduction, 82/93 Shoulder external rotation, 70/70 Shoulder internal rotation; 12/23/18:  R/L 155/150 Shoulder flexion, 134/165 Shoulder abduction, 89/93 Shoulder external rotation, 70/70 Shoulder internal rotation, 01/21/19: R/L 154/150 Shoulder flexion, 136/165 Shoulder abduction, 85/93 Shoulder external rotation, 70/70 Shoulder internal rotation    Time  8    Period  Weeks    Status  Partially Met    Target Date  03/18/19      PT LONG TERM GOAL #4   Title  Pt will decrease worst pain as reported on NPRS by at least 3 points (3 or less) in order to demonstrate clinically significant reduction in pain.    Baseline  10/30/18: 6/10; 11/27/18: 4/10; 12/23/18: 5/10 (improved pain frequency); 01/21/19: 5/10 (pain is less frequent);    Time  8    Period  Weeks    Status  Partially Met    Target Date  03/18/19      PT LONG TERM GOAL #5   Title  Pt will decrease quick DASH score by at least 8% in order to demonstrate clinically significant reduction in disability.    Baseline  11/27/18: 45.45%; 12/23/18: 40.9%; 01/21/19: 29.5%    Time  8  Period  Weeks    Status  Achieved      PT LONG TERM GOAL #6   Title  Pt will improve pain-free R shoulder strength for flexion and abduction so that it is symmetrical with L shoulder in order to return to full function at home    Baseline  12/23/18: R/L 4-/4+ Shoulder flexion, 4/4+ Shoulder abduction, both painful on R side; 01/21/19: R/L 4/4+ Shoulder flexion, 4/4+ Shoulder abduction, both painful on R side    Time  8    Period  Weeks    Status  On-going    Target Date  03/18/19             Plan - 01/23/19 1051    Clinical Impression Statement  Pt demonstrates good motivation during session.She is improving with respect to her R shoulder strength against gravity especially with flexion and abdcution. She reports only minimal increase in pain at end range. Pt advised to add 1# to supine and sidelying flexion, abduction, and ER at home. Ptencouraged to follow-up as scheduledand continue HEP.Pt will benefit from PT services to address deficits in strength,mobility, and painin order to return to full function at home.    Rehab Potential  Good    PT Frequency  2x / week    PT Duration  8 weeks    PT Treatment/Interventions  ADLs/Self Care Home Management;Aquatic Therapy;Canalith Repostioning;Cryotherapy;Electrical Stimulation;Iontophoresis '4mg'$ /ml Dexamethasone;Moist Heat;Traction;Ultrasound;Contrast Bath;DME Instruction;Gait training;Stair training;Functional mobility training;Therapeutic activities;Therapeutic exercise;Balance training;Neuromuscular re-education;Patient/family education;Manual techniques;Passive range of motion;Dry needling;Splinting;Taping;Vestibular    PT Next Visit Plan  Review HEP and progress as needed, Progress AAROM/AROM/strengthening of shoulder as tolerated without pain    PT Home Exercise Plan  Cervical retractions, Scapular retractions, R upper trap stretch, supine canes for AAROM flexion with hands together progressing to AROM supine flexion, AROM sidelying shoulder abduction, AROM sidelying shoulder ER, seated pulleys for flexion and scaption ([id number])    Consulted and Agree with Plan of Care  Patient       Patient will benefit from skilled therapeutic intervention in order to improve the following deficits and impairments:  Decreased strength, Decreased range of motion, Impaired UE functional use, Pain  Visit Diagnosis: Acute pain of right shoulder  Stiffness of right shoulder, not elsewhere classified  Muscle weakness  (generalized)     Problem List Patient Active Problem List   Diagnosis Date Noted  . Chronic diarrhea 01/14/2019  . Malignant melanoma ([place]) 11/23/2016  . Urge incontinence 11/23/2016  . Atypical mole 09/22/2016  . Slurring of speech 05/12/2016  . Traumatic arthritis elbow 05/04/2015  . Left foot pain 12/31/2014  . Severe sprain of left ankle 12/31/2014  . Osteopenia 09/15/2014  . History of TIA (transient ischemic attack) 05/19/2014  . Fracture of coronoid process of ulna, left, closed 04/25/2014  . Ingrown toenail 01/15/2014  . Obesity (BMI 30-39.9) 10/08/2013  . OA (osteoarthritis) of knee 07/07/2013  . Chronic low back pain 05/09/2013  . Obstructive sleep apnea 05/09/2013  . GERD (gastroesophageal reflux disease) 05/09/2013  . Hiatal hernia 05/09/2013  . Fibromyalgia syndrome 05/09/2013  . Depression, major, in partial remission ([place]) 05/09/2013  . RLS (restless legs syndrome) 05/09/2013  . Diabetes mellitus with no complication ([place]) 62/69/4854  . High cholesterol 05/09/2013  . 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 Family history of colon cancer 05/09/2013  . Allergic rhinitis 05/09/2013  . Lateral meniscal tear 09/04/2012  . CAD (coronary artery disease) 09/27/2011  . HTN (hypertension) 09/27/2011  . Paroxysmal A-fib ([place]) 09/27/2011  Phillips Grout PT, DPT, GCS  Huma Imhoff 01/24/2019, 1:50 PM  [place] MAIN [facility] SERVICES [address] [place], [place], [address] Phone: [phone]   Fax:  [phone]  Name: Winifred Bodiford MRN: [id number] Date of Birth: [birth year]

## 2019-01-28 ENCOUNTER — Ambulatory Visit: Payer: Medicare Other

## 2019-01-30 ENCOUNTER — Other Ambulatory Visit: Payer: Self-pay

## 2019-01-30 ENCOUNTER — Ambulatory Visit: Payer: Medicare Other

## 2019-01-30 DIAGNOSIS — M6281 Muscle weakness (generalized): Secondary | ICD-10-CM

## 2019-01-30 DIAGNOSIS — M25611 Stiffness of right shoulder, not elsewhere classified: Secondary | ICD-10-CM

## 2019-01-30 DIAGNOSIS — M25511 Pain in right shoulder: Secondary | ICD-10-CM

## 2019-01-30 NOTE — Therapy (Signed)
[place] MAIN [facility] SERVICES [address] [place], [place], [address] Phone: [phone]   Fax:  [phone]  Physical Therapy Treatment  Patient Details  Name: Morgan [name] MRN: [id number] Date of Birth: 08/13/1941 Referring Provider (PT): Dr. Mayer Camel   Encounter Date: 01/30/2019  PT End of Session - 01/30/19 1026    Visit Number  25    Number of Visits  49    Date for PT Re-Evaluation  03/18/19    Authorization Type  Last goals: 01/21/19,  09/30/18 (eval)    PT Start Time  1023    PT Stop Time  1105    PT Time Calculation (min)  42 min    Activity Tolerance  Patient limited by pain    Behavior During Therapy  [facility] for tasks assessed/performed       Past Medical History:  Diagnosis Date  . Angina   . Arthritis   . Atrial fibrillation (HCC)    ASPIRIN FOR BLOOD THINNER  . Atypical mole 09/22/2016  . Bulging discs    lumbar   . Cancer ([place])    melanoma  . Complication of anesthesia    pt states has choking sensation with ET tube   . Coronary artery disease   . Depression   . Diabetes mellitus    diet controlled/on meds  . Fibromyalgia   . GERD (gastroesophageal reflux disease)   . H/O hiatal hernia   . Headache(784.0)    "recurring"  . High cholesterol   . Hyperlipidemia   . Hypertension   . 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 Migraines    "til ~ 1980"  . PONV (postoperative nausea and vomiting)   . Restless leg syndrome   . Sciatic nerve pain    "from pinched nerve"  . Sleep apnea    uses CPAP  . Weakness of right side of body    "I've had PT for it; they don't know what it's from".  CORTISONE INJECTION INTO BACK 08/30/12    Past Surgical History:  Procedure Laterality Date  . [place] STUDY N/A 12/29/2015   Procedure: [place] STUDY;  Surgeon: Manus Gunning, MD;  Location: [facility];  Service: Gastroenterology;  Laterality: N/A;  . CARPAL TUNNEL RELEASE Left 07/02/2015   Procedure: CARPAL TUNNEL RELEASE;   Surgeon: Frederik Pear, MD;  Location: [place];  Service: Orthopedics;  Laterality: Left;  . CATARACT EXTRACTION, BILATERAL Bilateral    Oct and Nov 2017  . CORONARY ANGIOPLASTY WITH STENT PLACEMENT  06/2011   "1"  . CORONARY ARTERY BYPASS GRAFT  2005   CABG X 2  . DILATION AND CURETTAGE OF UTERUS     "more than once"  . ELBOW ARTHROSCOPY Left 07/02/2015   Procedure: ARTHROSCOPY LEFT ELBOW WITH DEBRIDEMENT AND REMOVAL LOOSE BODY;  Surgeon: Frederik Pear, MD;  Location: [place];  Service: Orthopedics;  Laterality: Left;  . ESOPHAGEAL MANOMETRY N/A 12/29/2015   Procedure: ESOPHAGEAL MANOMETRY (EM);  Surgeon: Manus Gunning, MD;  [facility];  Service: Gastroenterology;  Laterality: N/A;  . FRACTURE SURGERY  ~ 2005   nose  . KNEE ARTHROSCOPY  09/04/2012   Procedure: ARTHROSCOPY KNEE;  Surgeon: Gearlean Alf, MD;  Location: [facility];  Service: Orthopedics;  Laterality: Right;  right knee arthroscopy with medial and lateral meniscus debridement  . 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 MOUTH SURGERY  2004   "bone  replacement; had cadavear bones put in; face was collapsing"  . NASAL SEPTUM SURGERY  ~ 1986  . TOTAL KNEE ARTHROPLASTY Right 07/07/2013   Procedure: RIGHT TOTAL KNEE ARTHROPLASTY;  Surgeon: Gearlean Alf, MD;  Location: [facility];  Service: Orthopedics;  Laterality: Right;    There were no vitals filed for this visit.  Subjective Assessment - 01/30/19 1025    Subjective  Pt reports that her shoulder has been sore and achy over the last couple days. She rates it as a 4/10 currently. No specific questions at this time.     Pertinent History  On 08/03/18 pt was carrying a folding table and fell and right arm was under the handle got stuck between the door and the table. She landed on her R side. Tried to get up and had to pull at her arm. Pt had immediate pain in her right shoulder. She waiteduntil Monday and went to  see Dr. Edilia Bo her PCP. He took radiographs but didn't find any fractures. She continued to have pain so she called Dr. Damita Dunnings office, her orthopedist, and saw him that same week. He took additional radiographs and found a hairline fracture in her humeral shaft and a greater tuberosity fracture per patient report. She states that she is not healing well and has recurrent plain film radiographs. She is still wearing a sling and was encouraged not to perform any AROM of her right shoulder. Since her last visit with Dr. Mayer Camel on 09/20/18 she has had an additional fall and landed on her R shoulder. She reports additional bruising and pain in her R shoulder. She has a follow-up appt scheduled with her orthopedist for 10/15/18. 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 ROS negative for red flags.    Limitations  Lifting    Diagnostic tests  Plain film radiographs with decreased healing per pt report    Patient Stated Goals  Improve ability to use RUE and decrease pain    Currently in Pain?  Yes    Pain Score  4     Pain Location  Shoulder    Pain Orientation  Right    Pain Descriptors / Indicators  Aching    Pain Type  Chronic pain    Pain Onset  1 to 4 weeks ago           TREATMENT   Ther-ex UBE 71mn forward/227m backwards for warm-up during history (3 minutes unbilled); Standing UE ranger for flexion, scaption and CW/CCW circles x 15each direction; Wall push up x 10; Forearm wall slides x 10 with cues for scapular retraction x 10; Reaching targets with therapist providing varying resistance/direction with red tband to RUE x multiple bouts; Standing R shoulder flexion with red tband resistance x 10; Standing R shoulder abduction with red tband to 90 degrees resistance x 10; Standing R bicep curl stepping on green tband 2 x 10; Standing R tricep extension with green tband 2 x 10; Standing rows with green tband 2 x 10; Standing R shoulder ER with green tband  2 x 10 Standing R shoulder extension with green tband 2 x 10; Standing dynamic hugs with green tband 2 x 10;   Manual Therapy  PROM R shoulder flexion, scaption, ER, and IR with end range holds for flexion and scaption which are the most restricted directions; Supine R shoulder posterior and inferior mobilizations at available end range flexion grade III, 30s/bout x 2 bouts; Supine R shoulder  A/P mobilizations at 90 flexion pushing through elbow, grade II-III, x 30s;   Pt educated throughout session about proper posture and technique with exercises. Improved exercise technique, movement at target joints, use of target muscles after min to mod verbal, visual, tactile cues.   Pt demonstrates good motivation during session.She is improving with respect to her R shoulder strength against gravity. She continues to demonstrate restrictions in flexion and abduction ROM. Encouraged pt to continue with resisted supine flexion and sidelying abduction as well as ER. Ptencouraged to follow-up as scheduledand continue HEP.Pt will benefit from PT services to address deficits in strength,mobility, and painin order to return to full function at home.                      PT Short Term Goals - 01/21/19 1123      PT SHORT TERM GOAL #1   Title  Pt will be independent with HEP in order to improve strength and decrease pain in order to improve pain-free function at home.    Time  4    Period  Weeks    Status  Achieved        PT Long Term Goals - 01/21/19 1123      PT LONG TERM GOAL #1   Title  Pt will decrease quick DASH score by at least 8% in order to demonstrate clinically significant reduction in disability.    Baseline  09/30/18: 84.1%; 10/30/18: 56.8%; 11/27/18: 45.45%; 12/23/18: 40.9%    Time  8    Period  Weeks    Status  Achieved      PT LONG TERM GOAL #2   Title  Pt will decrease worst pain as reported on NPRS by at least 3 points in order to demonstrate  clinically significant reduction in pain.    Baseline  09/30/18: worst: 9/10; 10/30/18: 6/10;    Time  8    Period  Weeks    Status  Achieved      PT LONG TERM GOAL #3   Title  Pt will improve PROM of R shoulder to within 10 degrees of R shoulder in order to improve functional use of R shoulder for ADLs/IADLs.    Baseline  R/L 129/150 Shoulder flexion, 100/165 Shoulder abduction, 50/93 Shoulder external rotation, 50/70 Shoulder internal rotation; 10/30/18: R/L 140/150 Shoulder flexion, 132/165 Shoulder abduction, 82/93 Shoulder external rotation, 70/70 Shoulder internal rotation; 12/23/18:  R/L 155/150 Shoulder flexion, 134/165 Shoulder abduction, 89/93 Shoulder external rotation, 70/70 Shoulder internal rotation, 01/21/19: R/L 154/150 Shoulder flexion, 136/165 Shoulder abduction, 85/93 Shoulder external rotation, 70/70 Shoulder internal rotation    Time  8    Period  Weeks    Status  Partially Met    Target Date  03/18/19      PT LONG TERM GOAL #4   Title  Pt will decrease worst pain as reported on NPRS by at least 3 points (3 or less) in order to demonstrate clinically significant reduction in pain.    Baseline  10/30/18: 6/10; 11/27/18: 4/10; 12/23/18: 5/10 (improved pain frequency); 01/21/19: 5/10 (pain is less frequent);    Time  8    Period  Weeks    Status  Partially Met    Target Date  03/18/19      PT LONG TERM GOAL #5   Title  Pt will decrease quick DASH score by at least 8% in order to demonstrate clinically significant reduction in disability.    Baseline  11/27/18:  45.45%; 12/23/18: 40.9%; 01/21/19: 29.5%    Time  8    Period  Weeks    Status  Achieved      PT LONG TERM GOAL #6   Title  Pt will improve pain-free R shoulder strength for flexion and abduction so that it is symmetrical with L shoulder in order to return to full function at home    Baseline  12/23/18: R/L 4-/4+ Shoulder flexion, 4/4+ Shoulder abduction, both painful on R side; 01/21/19: R/L 4/4+ Shoulder flexion, 4/4+  Shoulder abduction, both painful on R side    Time  8    Period  Weeks    Status  On-going    Target Date  03/18/19            Plan - 01/30/19 1026    Clinical Impression Statement  Pt demonstrates good motivation during session.She is improving with respect to her R shoulder strength against gravity. She continues to demonstrate restrictions in flexion and abduction ROM. Encouraged pt to continue with resisted supine flexion and sidelying abduction as well as ER. Ptencouraged to follow-up as scheduledand continue HEP.Pt will benefit from PT services to address deficits in strength,mobility, and painin order to return to full function at home.    Rehab Potential  Good    PT Frequency  2x / week    PT Duration  8 weeks    PT Treatment/Interventions  ADLs/Self Care Home Management;Aquatic Therapy;Canalith Repostioning;Cryotherapy;Electrical Stimulation;Iontophoresis '4mg'$ /ml Dexamethasone;Moist Heat;Traction;Ultrasound;Contrast Bath;DME Instruction;Gait training;Stair training;Functional mobility training;Therapeutic activities;Therapeutic exercise;Balance training;Neuromuscular re-education;Patient/family education;Manual techniques;Passive range of motion;Dry needling;Splinting;Taping;Vestibular    PT Next Visit Plan  Review HEP and progress as needed, Progress AAROM/AROM/strengthening of shoulder as tolerated without pain    PT Home Exercise Plan  Cervical retractions, Scapular retractions, R upper trap stretch, supine canes for AAROM flexion with hands together progressing to AROM supine flexion, AROM sidelying shoulder abduction, AROM sidelying shoulder ER, seated pulleys for flexion and scaption ([id number])    Consulted and Agree with Plan of Care  Patient       Patient will benefit from skilled therapeutic intervention in order to improve the following deficits and impairments:  Decreased strength, Decreased range of motion, Impaired UE functional use, Pain  Visit  Diagnosis: Acute pain of right shoulder  Stiffness of right shoulder, not elsewhere classified  Muscle weakness (generalized)     Problem List Patient Active Problem List   Diagnosis Date Noted  . Chronic diarrhea 01/14/2019  . Malignant melanoma ([place]) 11/23/2016  . Urge incontinence 11/23/2016  . Atypical mole 09/22/2016  . Slurring of speech 05/12/2016  . Traumatic arthritis elbow 05/04/2015  . Left foot pain 12/31/2014  . Severe sprain of left ankle 12/31/2014  . Osteopenia 09/15/2014  . History of TIA (transient ischemic attack) 05/19/2014  . Fracture of coronoid process of ulna, left, closed 04/25/2014  . Ingrown toenail 01/15/2014  . Obesity (BMI 30-39.9) 10/08/2013  . OA (osteoarthritis) of knee 07/07/2013  . Chronic low back pain 05/09/2013  . Obstructive sleep apnea 05/09/2013  . GERD (gastroesophageal reflux disease) 05/09/2013  . Hiatal hernia 05/09/2013  . Fibromyalgia syndrome 05/09/2013  . Depression, major, in partial remission ([place]) 05/09/2013  . RLS (restless legs syndrome) 05/09/2013  . Diabetes mellitus with no complication ([place]) 29/51/8841  . High cholesterol 05/09/2013  . 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 Family history of colon cancer 05/09/2013  . Allergic rhinitis 05/09/2013  . Lateral meniscal tear 09/04/2012  . CAD (coronary artery disease) 09/27/2011  . HTN (  hypertension) 09/27/2011  . Paroxysmal A-fib ([place]) 09/27/2011   Phillips Grout PT, DPT, GCS  , 01/30/2019, 11:19 AM  [place] MAIN [facility] SERVICES [address] [place], [place], [address] Phone: [phone]   Fax:  [phone]  Name: Edwardine Deschepper MRN: [id number] Date of Birth: 08-15-1941

## 2019-01-31 ENCOUNTER — Other Ambulatory Visit: Payer: Self-pay | Admitting: Family Medicine

## 2019-02-04 ENCOUNTER — Ambulatory Visit: Payer: Medicare Other

## 2019-02-04 NOTE — Therapy (Signed)
[place] MAIN [facility] SERVICES [address] [place], [place], [address] Phone: [phone]   Fax:  [phone]  Patient Details  Name: Morgan [name] MRN: [id number] Date of Birth: 1941-01-22 Referring Provider:  No ref. provider found  Encounter Date: 02/04/2019   PT spoke with patient over the phone to notify of outpatient physical therapy clinic closure secondary to COVID-19 precautions. Pt does not have any questions at this time. Therapist requested patient call/email if needed with any questions or concerns regarding her home physical therapy program while clinic is closed. Therapist provided pt with clinic phone number. Reviewed exercises verbally and patient verbalized understanding.    Phillips Grout PT, DPT, GCS  Blanton Kardell 02/04/2019, 11:09 AM  [place] MAIN [facility] SERVICES [address] [place], [place], [address] Phone: [phone]   Fax:  [phone]

## 2019-02-06 ENCOUNTER — Ambulatory Visit: Payer: Medicare Other

## 2019-02-11 ENCOUNTER — Ambulatory Visit: Payer: Medicare Other

## 2019-02-17 ENCOUNTER — Other Ambulatory Visit: Payer: Self-pay | Admitting: Family Medicine

## 2019-02-21 NOTE — Therapy (Signed)
[place] MAIN [facility] SERVICES [address] [place], [place], [address] Phone: [phone]   Fax:  [phone]  Patient Details  Name: Morgan [name] MRN: [id number] Date of Birth: [birth year] Referring Provider:  No ref. provider found  Encounter Date: 02/21/2019   The [facility] outpatient clinics are closed at this time due to the COVID-19 epidemic. The patient was contacted in regards to their therapy services. The patient is unsure whether she is interested in teleheatlh physical therapy visits at this time. She meets with her orthopedist, Dr. Mayer Camel, this week and will discuss this option with him. Pt states that she will call back to leave message. If she declines she is in agreement that she is safe and consents to being on hold for therapy services until the [facility] outpatient facilities reopen. At that time the patient will be contacted to schedule an appointment to resume therapy services.      Phillips Grout PT, DPT, GCS  Huprich,Jason 02/21/2019, 4:23 PM  [place] MAIN [facility] SERVICES [address] [place], [place], [address] Phone: [phone]   Fax:  [phone]

## 2019-02-25 ENCOUNTER — Encounter: Payer: Self-pay | Admitting: Family Medicine

## 2019-02-25 ENCOUNTER — Ambulatory Visit (INDEPENDENT_AMBULATORY_CARE_PROVIDER_SITE_OTHER): Payer: Medicare Other | Admitting: Family Medicine

## 2019-02-25 DIAGNOSIS — I10 Essential (primary) hypertension: Secondary | ICD-10-CM | POA: Diagnosis not present

## 2019-02-25 DIAGNOSIS — K529 Noninfective gastroenteritis and colitis, unspecified: Secondary | ICD-10-CM | POA: Diagnosis not present

## 2019-02-25 NOTE — Progress Notes (Signed)
VIRTUAL VISIT Due to national recommendations of social distancing due to [place] 19, a virtual visit is felt to be most appropriate for this patient at this time.   I connected with the patient on 02/25/19 at  2:00 PM EDT by virtual telehealth platform and verified that I am speaking with the correct person using two identifiers.   I discussed the limitations, risks, security and privacy concerns of performing an evaluation and management service by  virtual telehealth platform and the availability of in person appointments. I also discussed with the patient that there may be a patient responsible charge related to this service. The patient expressed understanding and agreed to proceed.  Patient location: Home Provider Location: [place] Participants: Eliezer Lofts and [facility]   Chief Complaint  Patient presents with  . Follow-up    Chronic Diarrhea    History of Present Illness: 78 year old female patient presents for follow up chronic diarrhea.  At last OV:  No red flags such as blood in stool.. pt due for routine repeat colonoscopy later this year.  no clear infectious cause.  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 IBS vs secondary to metformin (has been on for a long time) Will first try to eliminate lactose from diet, if not effected decrease metformin.  If not improving may need lab eval or GI referral.  She reports she has had less BMs. She  3 episodes in last 1 month of loose stool.. lasted for 1-2 hours.  She has gone to silk.  No lactose ice cream.  She is still taking metformin.  FBS 113-117  Her blood pressure is a little higher lately since decreasing the chlorthalidone to 12.5 mg dialy BP Readings from Last 3 Encounters:  02/25/19 (!) 165/93  01/14/19 138/66  12/27/18 (!) 158/80   NO SOB, no edema, no t very active.   COVID 19 screen No recent travel or known exposure to COVID19 The patient denies respiratory symptoms of COVID 19 at this time.   The importance of social distancing was discussed today.   Review of Systems  Constitutional: Negative for chills and fever.  HENT: Negative for congestion and ear pain.   Eyes: Negative for pain and redness.  Respiratory: Negative for cough and shortness of breath.   Cardiovascular: Negative for chest pain, palpitations and leg swelling.  Gastrointestinal: Negative for abdominal pain, blood in stool, constipation, diarrhea, nausea and vomiting.  Genitourinary: Negative for dysuria.  Musculoskeletal: Negative for falls and myalgias.  Skin: Negative for rash.  Neurological: Negative for dizziness.  Psychiatric/Behavioral: Negative for depression. The patient is not nervous/anxious.       Past Medical History:  Diagnosis Date  . Angina   . Arthritis   . Atrial fibrillation (HCC)    ASPIRIN FOR BLOOD THINNER  . Atypical mole 09/22/2016  . Bulging discs    lumbar   . Cancer ([place])    melanoma  . Complication of anesthesia    pt states has choking sensation with ET tube   . Coronary artery disease   . Depression   . Diabetes mellitus    diet controlled/on meds  . Fibromyalgia   . GERD (gastroesophageal reflux disease)   . H/O hiatal hernia   . Headache(784.0)    "recurring"  . High cholesterol   . Hyperlipidemia   . Hypertension   . 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 Migraines    "til ~ 1980"  . PONV (postoperative nausea and vomiting)   .  Restless leg syndrome   . Sciatic nerve pain    "from pinched nerve"  . Sleep apnea    uses CPAP  . Weakness of right side of body    "I've had PT for it; they don't know what it's from".  CORTISONE INJECTION INTO BACK 08/30/12    reports that she has never smoked. She has never used smokeless tobacco. She reports current alcohol use of about 1.0 standard drinks of alcohol per week. She reports that she does not use drugs.   Current Outpatient Medications:  .  aspirin EC 81 MG tablet, Take 1 tablet (81 mg total) by mouth daily., Disp: , Rfl:   .  atorvastatin (LIPITOR) 80 MG tablet, TAKE 1 TABLET BY MOUTH EVERY DAY, Disp: 90 tablet, Rfl: 3 .  CARAFATE 1 GM/10ML suspension, TAKE 10 MLS (1 G TOTAL) BY MOUTH EVERY 6 (SIX) HOURS AS NEEDED., Disp: 420 mL, Rfl: 3 .  cetirizine (ZYRTEC) 10 MG tablet, Take 10 mg by mouth daily. Reported on 12/23/2015, Disp: , Rfl:  .  chlorthalidone (HYGROTON) 25 MG tablet, Take 12.5 mg by mouth daily., Disp: , Rfl:  .  clonazePAM (KLONOPIN) 1 MG tablet, TAKE 2 TABLETS (2 MG TOTAL) BY MOUTH AT BEDTIME., Disp: 60 tablet, Rfl: 0 .  DEXILANT 60 MG capsule, TAKE 1 CAPSULE BY MOUTH EVERY DAY, Disp: 90 capsule, Rfl: 1 .  gabapentin (NEURONTIN) 300 MG capsule, TAKE 1 TO 2 CAPSULES BY MOUTH AT BEDTIME, Disp: 180 capsule, Rfl: 1 .  glucose blood (FREESTYLE LITE) test strip, Use to check blood sugar twice daily, Disp: 125 each, Rfl: 3 .  Insulin Pen Needle (NOVOFINE) 32G X 6 MM MISC, Use to inject Victoza daily.  Dx: E11.9, Disp: 90 each, Rfl: 3 .  Lancet Device MISC, Use to check blood sugar twice daily., Disp: 1 each, Rfl: 0 .  Lancets (FREESTYLE) lancets, Use to check blood sugar twice daily, Disp: 100 each, Rfl: 11 .  meloxicam (MOBIC) 15 MG tablet, Take 15 mg by mouth daily. Reported on 12/23/2015, Disp: , Rfl:  .  metFORMIN (GLUCOPHAGE-XR) 500 MG 24 hr tablet, TAKE 2 TABLETS BY MOUTH TWICE A DAY, Disp: 360 tablet, Rfl: 3 .  metoprolol succinate (TOPROL-XL) 100 MG 24 hr tablet, TAKE 1 TABLET (100 MG TOTAL) BY MOUTH DAILY. TAKE WITH OR IMMEDIATELY FOLLOWING A MEAL., Disp: 90 tablet, Rfl: 1 .  Multiple Vitamins-Minerals (PRESERVISION AREDS PO), Take 1 tablet by mouth 2 (two) times daily. Reported on 12/23/2015, Disp: , Rfl:  .  MYRBETRIQ 25 MG TB24 tablet, TAKE 1 TABLET BY MOUTH EVERY DAY, Disp: 90 tablet, Rfl: 1 .  nitroGLYCERIN (NITROSTAT) 0.4 MG SL tablet, PLACE 1 TABLET (0.4 MG TOTAL) UNDER THE TONGUE EVERY 5 (FIVE) MINUTES AS NEEDED FOR CHEST PAIN, Disp: 25 tablet, Rfl: 0 .  Probiotic Product (PROBIOTIC DAILY PO), Take  1 tablet by mouth daily. , Disp: , Rfl:  .  tiZANidine (ZANAFLEX) 4 MG tablet, TAKE 1 TABLET BY MOUTH EVERY DAY IN THE EVENING, Disp: 30 tablet, Rfl: 0 .  traMADol (ULTRAM) 50 MG tablet, Take 50 mg by mouth every 6 (six) hours as needed. for pain, Disp: , Rfl: 0 .  venlafaxine XR (EFFEXOR-XR) 37.5 MG 24 hr capsule, TAKE 1 CAPSULE (37.5 MG TOTAL) BY MOUTH 2 (TWO) TIMES DAILY., Disp: 180 capsule, Rfl: 1 .  VICTOZA 18 MG/3ML SOPN, INJECT 1.8 MG INTO THE SKIN DAILY., Disp: 27 pen, Rfl: 3   Observations/Objective: Blood pressure (!) 165/93, pulse  89, height 5' 2.25" (1.581 m).  Physical Exam  Physical Exam Constitutional:      General: She is not in acute distress. Pulmonary:     Effort: Pulmonary effort is normal. No respiratory distress.  Neurological:     Mental Status: She is alert and oriented to person, place, and time.  Psychiatric:        Mood and Affect: Mood normal.        Behavior: Behavior normal.   Assessment and Plan HTN (hypertension)  INadequate control on current regimen in AMs... Was too low in AMS before. Try taking chlorthalidone 12.5 mg in AM and metoprolol at bedtime.  Follow BP at home.. call MyChart in 1 week with measurements.. take some in AM and some in PM.   Chronic diarrhea Much improved with lactose free diet.     I discussed the assessment and treatment plan with the patient. The patient was provided an opportunity to ask questions and all were answered. The patient agreed with the plan and demonstrated an understanding of the instructions.   The patient was advised to call back or seek an in-person evaluation if the symptoms worsen or if the condition fails to improve as anticipated.     Eliezer Lofts, MD

## 2019-02-25 NOTE — Assessment & Plan Note (Signed)
INadequate control on current regimen in AMs... Was too low in AMS before. Try taking chlorthalidone 12.5 mg in AM and metoprolol at bedtime.  Follow BP at home.. call MyChart in 1 week with measurements.. take some in AM and some in PM.

## 2019-02-25 NOTE — Patient Instructions (Addendum)
Try taking chlorthalidone 12.5 mg in AM and metoprolol at bedtime.  Follow BP at home.. call MyChart in 1 week with measurements.. take some in AM and some in PM.  Continue work on lactose free diet.

## 2019-02-25 NOTE — Assessment & Plan Note (Signed)
Much improved with lactose free diet.

## 2019-02-26 ENCOUNTER — Other Ambulatory Visit: Payer: Medicare Other

## 2019-03-06 ENCOUNTER — Other Ambulatory Visit: Payer: Self-pay | Admitting: Family Medicine

## 2019-03-10 ENCOUNTER — Other Ambulatory Visit: Payer: Self-pay

## 2019-03-10 ENCOUNTER — Ambulatory Visit: Payer: Medicare Other

## 2019-03-11 ENCOUNTER — Other Ambulatory Visit: Payer: Self-pay | Admitting: Family Medicine

## 2019-03-11 NOTE — Telephone Encounter (Signed)
Last  visit 02/25/2019 for HTN.  Last refilled 02/03/2019 for #60 with no refills.  CPE scheduled for 01/27/2020.

## 2019-03-19 ENCOUNTER — Ambulatory Visit: Payer: Medicare Other | Attending: Orthopedic Surgery

## 2019-03-19 ENCOUNTER — Other Ambulatory Visit: Payer: Self-pay

## 2019-03-19 DIAGNOSIS — M6281 Muscle weakness (generalized): Secondary | ICD-10-CM | POA: Diagnosis present

## 2019-03-19 DIAGNOSIS — M25611 Stiffness of right shoulder, not elsewhere classified: Secondary | ICD-10-CM | POA: Diagnosis present

## 2019-03-19 DIAGNOSIS — M25511 Pain in right shoulder: Secondary | ICD-10-CM | POA: Diagnosis not present

## 2019-03-19 NOTE — Therapy (Signed)
[place] MAIN [facility] SERVICES [address] [place], [place], [address] Phone: [phone]   Fax:  [phone]  Physical Therapy Treatment/Recertification  Patient Details  Name: Morgan [name] MRN: [id number] Date of Birth: 30-Jul-1941 Referring Provider (PT): Dr. Mayer Camel   Encounter Date: 03/19/2019  PT End of Session - 03/19/19 1425    Visit Number  26    Number of Visits  65    Date for PT Re-Evaluation  05/14/19    Authorization Type  Goals updated via telehealth on 03/19/19; 09/30/18 (eval)    PT Start Time  1415    PT Stop Time  1502    PT Time Calculation (min)  47 min    Activity Tolerance  Patient limited by pain    Behavior During Therapy  [facility] for tasks assessed/performed       Past Medical History:  Diagnosis Date  . Angina   . Arthritis   . Atrial fibrillation (HCC)    ASPIRIN FOR BLOOD THINNER  . Atypical mole 09/22/2016  . Bulging discs    lumbar   . Cancer ([place])    melanoma  . Complication of anesthesia    pt states has choking sensation with ET tube   . Coronary artery disease   . Depression   . Diabetes mellitus    diet controlled/on meds  . Fibromyalgia   . GERD (gastroesophageal reflux disease)   . H/O hiatal hernia   . Headache(784.0)    "recurring"  . High cholesterol   . Hyperlipidemia   . Hypertension   . 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 Migraines    "til ~ 1980"  . PONV (postoperative nausea and vomiting)   . Restless leg syndrome   . Sciatic nerve pain    "from pinched nerve"  . Sleep apnea    uses CPAP  . Weakness of right side of body    "I've had PT for it; they don't know what it's from".  CORTISONE INJECTION INTO BACK 08/30/12    Past Surgical History:  Procedure Laterality Date  . [place] STUDY N/A 12/29/2015   Procedure: [place] STUDY;  Surgeon: Manus Gunning, MD;  Location: [facility];  Service: Gastroenterology;  Laterality: N/A;  . CARPAL TUNNEL RELEASE Left 07/02/2015   Procedure: CARPAL TUNNEL RELEASE;  Surgeon: Frederik Pear, MD;  Location: [facility];  Service: Orthopedics;  Laterality: Left;  . CATARACT EXTRACTION, BILATERAL Bilateral    Oct and Nov 2017  . CORONARY ANGIOPLASTY WITH STENT PLACEMENT  06/2011   "1"  . CORONARY ARTERY BYPASS GRAFT  2005   CABG X 2  . DILATION AND CURETTAGE OF UTERUS     "more than once"  . ELBOW ARTHROSCOPY Left 07/02/2015   Procedure: ARTHROSCOPY LEFT ELBOW WITH DEBRIDEMENT AND REMOVAL LOOSE BODY;  Surgeon: Frederik Pear, MD;  Location: [facility];  Service: Orthopedics;  Laterality: Left;  . ESOPHAGEAL MANOMETRY N/A 12/29/2015   Procedure: ESOPHAGEAL MANOMETRY (EM);  Surgeon: Manus Gunning, MD;  Location: [facility];  Service: Gastroenterology;  Laterality: N/A;  . FRACTURE SURGERY  ~ 2005   nose  . KNEE ARTHROSCOPY  09/04/2012   Procedure: ARTHROSCOPY KNEE;  Surgeon: Gearlean Alf, MD;  Location: [facility];  Service: Orthopedics;  Laterality: Right;  right knee arthroscopy with medial and lateral meniscus debridement  . 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 MOUTH SURGERY  2004   "  bone replacement; had cadavear bones put in; face was collapsing"  . NASAL SEPTUM SURGERY  ~ 1986  . TOTAL KNEE ARTHROPLASTY Right 07/07/2013   Procedure: RIGHT TOTAL KNEE ARTHROPLASTY;  Surgeon: Gearlean Alf, MD;  Location: [facility];  Service: Orthopedics;  Laterality: Right;    There were no vitals filed for this visit.  Subjective Assessment - 03/19/19 1417    Subjective  Pt reports that she is not having any shoulder pain at rest today. Overall since stopping therapy she has been having more shoulder pain than previously. No resting pain at start of session today. No specific questions at this time.     Pertinent History  On 08/03/18 pt was carrying a folding table and fell and right arm was under the handle got stuck between the door and the table. She landed on her R side. Tried to  get up and had to pull at her arm. Pt had immediate pain in her right shoulder. She waiteduntil Monday and went to see Dr. Edilia Bo her PCP. He took radiographs but didn't find any fractures. She continued to have pain so she called Dr. Damita Dunnings office, her orthopedist, and saw him that same week. He took additional radiographs and found a hairline fracture in her humeral shaft and a greater tuberosity fracture per patient report. She states that she is not healing well and has recurrent plain film radiographs. She is still wearing a sling and was encouraged not to perform any AROM of her right shoulder. Since her last visit with Dr. Mayer Camel on 09/20/18 she has had an additional fall and landed on her R shoulder. She reports additional bruising and pain in her R shoulder. She has a follow-up appt scheduled with her orthopedist for 10/15/18. 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 ROS negative for red flags.    Limitations  Lifting    Diagnostic tests  Plain film radiographs with decreased healing per pt report    Patient Stated Goals  Improve ability to use RUE and decrease pain    Currently in Pain?  No/denies           TREATMENT   Physical Therapy Telehealth Visit:  I connected with [place] today at 1415 by Webex video conference and verified that I am speaking with the correct person using two identifiers.  I discussed the limitations, risks, security and privacy concerns of performing an evaluation and management service by Webex. I also discussed with the patient that there may be a patient responsible charge related to this service. The patient expressed understanding and agreed to proceed. Identified to the patient that therapist is a licensed physical therapist in the state of [place].  Other persons participating in the visit and their role in the encounter: Husband present initially Patient's location: home  Patient's address: [address] [place]  [address] Patient's phone #: [phone] Provider's location: [facility] [place] Patient agreed to evaluation/treatment by telemedicine    Ther-ex Seated table towel flexion stretch 30s hold x 3; Seated table towel abduction stretch 30s hold x 3; Seated R upper trap stretch 30s hold x 3; L sidelying R shoulder abduction 1 minute x 2 L sidelying R shoulder ER 1 minute x 2 L sidelying R shoulder horizontal abduction 1 minute x 2; Wall push up x 10; Updated goals and plan of care with patient; Standing R shoulder flexion AROM with scapular retraction;   Pt educated  throughout session about proper posture and technique with exercises. Improved exercise technique, movement at target joints, use of target muscles after min to mod verbal cues;   Pt demonstrates good motivation during session.Updated goals and outcome measures with patient as she is able over the telehealth platform. Overall her worst R shoulder pain has slowly improved over her time in therapy. Based on gross visual observatios over video her R shoulder AROM flexion against gravity appears to be improving with less compensatory motor strategies. Her R shoulder AROM abduction still appears grossly limited slightly above 90 degrees. Encouraged pt to continue with her HEP and therapist will continue to progress her strength/ROM over the telemedicine platform as well as in the clinic once it re-opens.Pt will benefit from PT services to address deficits in strength,mobility, and painin order to return to full function at home.                      PT Short Term Goals - 03/19/19 1436      PT SHORT TERM GOAL #1   Title  Pt will be independent with HEP in order to improve strength and decrease pain in order to improve pain-free function at home.    Time  4    Period  Weeks    Status  Achieved        PT Long Term Goals - 03/19/19 1437      PT LONG TERM GOAL #1   Title  Pt will decrease  quick DASH score by at least 8% in order to demonstrate clinically significant reduction in disability.    Baseline  09/30/18: 84.1%; 10/30/18: 56.8%; 11/27/18: 45.45%; 12/23/18: 40.9%;     Time  8    Period  Weeks    Status  Achieved      PT LONG TERM GOAL #2   Title  Pt will decrease worst pain as reported on NPRS by at least 3 points in order to demonstrate clinically significant reduction in pain.    Baseline  09/30/18: worst: 9/10; 10/30/18: 6/10;    Time  8    Period  Weeks    Status  Achieved      PT LONG TERM GOAL #3   Title  Pt will improve PROM of R shoulder to within 10 degrees of R shoulder in order to improve functional use of R shoulder for ADLs/IADLs.    Baseline  R/L 129/150 Shoulder flexion, 100/165 Shoulder abduction, 50/93 Shoulder external rotation, 50/70 Shoulder internal rotation; 10/30/18: R/L 140/150 Shoulder flexion, 132/165 Shoulder abduction, 82/93 Shoulder external rotation, 70/70 Shoulder internal rotation; 12/23/18:  R/L 155/150 Shoulder flexion, 134/165 Shoulder abduction, 89/93 Shoulder external rotation, 70/70 Shoulder internal rotation, 01/21/19: R/L 154/150 Shoulder flexion, 136/165 Shoulder abduction, 85/93 Shoulder external rotation, 70/70 Shoulder internal rotation; 03/19/19: Unable to test over telemedicine    Time  8    Period  Weeks    Status  Partially Met    Target Date  05/14/19      PT LONG TERM GOAL #4   Title  Pt will decrease worst pain as reported on NPRS by at least 3 points (3 or less) in order to demonstrate clinically significant reduction in pain.    Baseline  10/30/18: 6/10; 11/27/18: 4/10; 12/23/18: 5/10 (improved pain frequency); 01/21/19: 5/10 (pain is less frequent); 03/19/19: 4/10;    Time  8    Period  Weeks    Status  Partially Met    Target Date  05/14/19      PT LONG TERM GOAL #5   Title  Pt will decrease quick DASH score by at least 8% in order to demonstrate clinically significant reduction in disability.    Baseline  11/27/18:  45.45%; 12/23/18: 40.9%; 01/21/19: 29.5%,     Time  8    Period  Weeks    Status  Achieved      PT LONG TERM GOAL #6   Title  Pt will improve pain-free R shoulder strength for flexion and abduction so that it is symmetrical with L shoulder in order to return to full function at home    Baseline  12/23/18: R/L 4-/4+ Shoulder flexion, 4/4+ Shoulder abduction, both painful on R side; 01/21/19: R/L 4/4+ Shoulder flexion, 4/4+ Shoulder abduction, both painful on R side; 03/19/19: Unable to test over telemedicine    Time  8    Period  Weeks    Status  On-going    Target Date  05/14/19            Plan - 03/19/19 1426    Clinical Impression Statement  Pt demonstrates good motivation during session.Updated goals and outcome measures with patient as she is able over the telehealth platform. Overall her worst R shoulder pain has slowly improved over her time in therapy. Based on gross visual observatios over video her R shoulder AROM flexion against gravity appears to be improving with less compensatory motor strategies. Her R shoulder AROM abduction still appears grossly limited slightly above 90 degrees. Encouraged pt to continue with her HEP and therapist will continue to progress her strength/ROM over the telemedicine platform as well as in the clinic once it re-opens.Pt will benefit from PT services to address deficits in strength,mobility, and painin order to return to full function at home.    Rehab Potential  Good    PT Frequency  2x / week    PT Duration  8 weeks    PT Treatment/Interventions  ADLs/Self Care Home Management;Aquatic Therapy;Canalith Repostioning;Cryotherapy;Electrical Stimulation;Iontophoresis '4mg'$ /ml Dexamethasone;Moist Heat;Traction;Ultrasound;Contrast Bath;DME Instruction;Gait training;Stair training;Functional mobility training;Therapeutic activities;Therapeutic exercise;Balance training;Neuromuscular re-education;Patient/family education;Manual techniques;Passive range of  motion;Dry needling;Splinting;Taping;Vestibular    PT Next Visit Plan  Review HEP and progress as needed, Progress AAROM/AROM/strengthening of shoulder as tolerated without pain    PT Home Exercise Plan  Cervical retractions, Scapular retractions, R upper trap stretch, supine canes for AAROM flexion with hands together progressing to AROM supine flexion, AROM sidelying shoulder abduction, AROM sidelying shoulder ER, seated pulleys for flexion and scaption ([id number])    Consulted and Agree with Plan of Care  Patient       Patient will benefit from skilled therapeutic intervention in order to improve the following deficits and impairments:  Decreased strength, Decreased range of motion, Impaired UE functional use, Pain  Visit Diagnosis: Acute pain of right shoulder  Stiffness of right shoulder, not elsewhere classified  Muscle weakness (generalized)     Problem List Patient Active Problem List   Diagnosis Date Noted  . Chronic diarrhea 01/14/2019  . Malignant melanoma ([place]) 11/23/2016  . Urge incontinence 11/23/2016  . Atypical mole 09/22/2016  . Slurring of speech 05/12/2016  . Traumatic arthritis elbow 05/04/2015  . Left foot pain 12/31/2014  . Severe sprain of left ankle 12/31/2014  . Osteopenia 09/15/2014  . History of TIA (transient ischemic attack) 05/19/2014  . Fracture of coronoid process of ulna, left, closed 04/25/2014  . Ingrown toenail 01/15/2014  . Obesity (BMI 30-39.9) 10/08/2013  . OA (osteoarthritis) of  knee 07/07/2013  . Chronic low back pain 05/09/2013  . Obstructive sleep apnea 05/09/2013  . GERD (gastroesophageal reflux disease) 05/09/2013  . Hiatal hernia 05/09/2013  . Fibromyalgia syndrome 05/09/2013  . Depression, major, in partial remission ([place]) 05/09/2013  . RLS (restless legs syndrome) 05/09/2013  . Diabetes mellitus with no complication ([place]) 75/08/2584  . High cholesterol 05/09/2013  . 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 Family history of colon  cancer 05/09/2013  . Allergic rhinitis 05/09/2013  . Lateral meniscal tear 09/04/2012  . CAD (coronary artery disease) 09/27/2011  . HTN (hypertension) 09/27/2011  . Paroxysmal A-fib ([place]) 09/27/2011   Phillips Grout PT, DPT, GCS  , 03/19/2019, 5:24 PM  [place] MAIN [facility] SERVICES [address] [place], [place], [address] Phone: [phone]   Fax:  [phone]  Name: Morgan [name] MRN: [id number] Date of Birth: 01/05/1941

## 2019-03-27 ENCOUNTER — Other Ambulatory Visit: Payer: Self-pay

## 2019-03-27 ENCOUNTER — Ambulatory Visit: Payer: Medicare Other

## 2019-03-27 DIAGNOSIS — M25511 Pain in right shoulder: Secondary | ICD-10-CM

## 2019-03-27 DIAGNOSIS — M6281 Muscle weakness (generalized): Secondary | ICD-10-CM

## 2019-03-27 DIAGNOSIS — M25611 Stiffness of right shoulder, not elsewhere classified: Secondary | ICD-10-CM

## 2019-03-27 NOTE — Therapy (Signed)
[place] MAIN [facility] SERVICES [address] [place], [place], [address] Phone: [phone]   Fax:  [phone]  Physical Therapy Treatment  Patient Details  Name: Morgan [name] MRN: [id number] Date of Birth: February 17, 1941 Referring Provider (PT): Dr. Mayer Camel   Encounter Date: 03/27/2019  PT End of Session - 03/27/19 1643    Visit Number  27    Number of Visits  65    Date for PT Re-Evaluation  05/14/19    Authorization Type  Goals updated via telehealth on 03/19/19; 09/30/18 (eval)    PT Start Time  1515    PT Stop Time  1600    PT Time Calculation (min)  45 min    Activity Tolerance  Patient tolerated treatment well    Behavior During Therapy  [facility] for tasks assessed/performed       Past Medical History:  Diagnosis Date  . Angina   . Arthritis   . Atrial fibrillation (HCC)    ASPIRIN FOR BLOOD THINNER  . Atypical mole 09/22/2016  . Bulging discs    lumbar   . Cancer ([place])    melanoma  . Complication of anesthesia    pt states has choking sensation with ET tube   . Coronary artery disease   . Depression   . Diabetes mellitus    diet controlled/on meds  . Fibromyalgia   . GERD (gastroesophageal reflux disease)   . H/O hiatal hernia   . Headache(784.0)    "recurring"  . High cholesterol   . Hyperlipidemia   . Hypertension   . 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 Migraines    "til ~ 1980"  . PONV (postoperative nausea and vomiting)   . Restless leg syndrome   . Sciatic nerve pain    "from pinched nerve"  . Sleep apnea    uses CPAP  . Weakness of right side of body    "I've had PT for it; they don't know what it's from".  CORTISONE INJECTION INTO BACK 08/30/12    Past Surgical History:  Procedure Laterality Date  . [place] STUDY N/A 12/29/2015   Procedure: [place] STUDY;  Surgeon: Manus Gunning, MD;  Location: [facility];  Service: Gastroenterology;  Laterality: N/A;  . CARPAL TUNNEL RELEASE Left 07/02/2015    Procedure: CARPAL TUNNEL RELEASE;  Surgeon: Frederik Pear, MD;  Location: [place];  Service: Orthopedics;  Laterality: Left;  . CATARACT EXTRACTION, BILATERAL Bilateral    Oct and Nov 2017  . CORONARY ANGIOPLASTY WITH STENT PLACEMENT  06/2011   "1"  . CORONARY ARTERY BYPASS GRAFT  2005   CABG X 2  . DILATION AND CURETTAGE OF UTERUS     "more than once"  . ELBOW ARTHROSCOPY Left 07/02/2015   Procedure: ARTHROSCOPY LEFT ELBOW WITH DEBRIDEMENT AND REMOVAL LOOSE BODY;  Surgeon: Frederik Pear, MD;  Location: [place];  Service: Orthopedics;  Laterality: Left;  . ESOPHAGEAL MANOMETRY N/A 12/29/2015   Procedure: ESOPHAGEAL MANOMETRY (EM);  Surgeon: Manus Gunning, MD;  Location: [facility];  Service: Gastroenterology;  Laterality: N/A;  . FRACTURE SURGERY  ~ 2005   nose  . KNEE ARTHROSCOPY  09/04/2012   Procedure: ARTHROSCOPY KNEE;  Surgeon: Gearlean Alf, MD;  Location: [facility];  Service: Orthopedics;  Laterality: Right;  right knee arthroscopy with medial and lateral meniscus debridement  . 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 MOUTH SURGERY  2004   "  bone replacement; had cadavear bones put in; face was collapsing"  . NASAL SEPTUM SURGERY  ~ 1986  . TOTAL KNEE ARTHROPLASTY Right 07/07/2013   Procedure: RIGHT TOTAL KNEE ARTHROPLASTY;  Surgeon: Gearlean Alf, MD;  Location: [facility];  Service: Orthopedics;  Laterality: Right;    There were no vitals filed for this visit.  Subjective Assessment - 03/27/19 1642    Subjective  Pt reports that she had a fall on Monday and landed on her R shoulder. She was having some shoulder pain but it has improved. She only notices increased shoulder pain now when she is using her R shoulder frequently or is near the end of her range of motion. She denies pain at start of session today. No specific questions currently.     Pertinent History  On 08/03/18 pt was carrying a folding table and fell and right  arm was under the handle got stuck between the door and the table. She landed on her R side. Tried to get up and had to pull at her arm. Pt had immediate pain in her right shoulder. She waiteduntil Monday and went to see Dr. Edilia Bo her PCP. He took radiographs but didn't find any fractures. She continued to have pain so she called Dr. Damita Dunnings office, her orthopedist, and saw him that same week. He took additional radiographs and found a hairline fracture in her humeral shaft and a greater tuberosity fracture per patient report. She states that she is not healing well and has recurrent plain film radiographs. She is still wearing a sling and was encouraged not to perform any AROM of her right shoulder. Since her last visit with Dr. Mayer Camel on 09/20/18 she has had an additional fall and landed on her R shoulder. She reports additional bruising and pain in her R shoulder. She has a follow-up appt scheduled with her orthopedist for 10/15/18. 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 ROS negative for red flags.    Limitations  Lifting    Diagnostic tests  Plain film radiographs with decreased healing per pt report    Patient Stated Goals  Improve ability to use RUE and decrease pain    Currently in Pain?  No/denies         TREATMENT   Physical Therapy Telehealth Visit:  I connected with [place] today at 1515 by Webex video conference and verified that I am speaking with the correct person using two identifiers.  I discussed the limitations, risks, security and privacy concerns of performing an evaluation and management service by Webex. I also discussed with the patient that there may be a patient responsible charge related to this service. The patient expressed understanding and agreed to proceed.Identified to the patient that therapist is a licensed physical therapist in the state of [place].  Other persons participating in the visit and their role in the  encounter:Husband present initially Patient's location: home Patient's address: [address] [place] [address] Patient's phone #: [phone] Provider's location:[facility] main OP clinic [place] Patient agreed to evaluation/treatment by telemedicine   Ther-ex Seated AROM R shoulder flexion with verbal cues for scapular retraction 2 x 10; Seated AROM R shoulder abduction with verbal cues for scapular retraction 2 x 10; Seated scapular retraction 3s hold 2 x 10; Seated table towel flexion stretch 30s hold x 3; Seated table towel abduction stretch 30s hold x 3; Seated R upper trap stretch 30s hold x 3; L sidelying  R shoulder abduction 1 minute x 2 L sidelying R shoulder ER 1 minute x 2 L sidelying R shoulder horizontal abduction 1 minute x 2; Prone R shoulder extension 1 minute x 2; Prone R shoulder horizontal abduction 1 minute x 2;   Pt educated throughout session about proper posture and technique with exercises. Improved exercise technique, movement at target joints, use of target muscles after min to mod verbal cues;   Pt demonstrates good motivation during session.She demonstrates AROM for flexion and abduction which is not painful and appears unchanged from last session. Pt encouraged to contact Dr. Mayer Camel to notify him of her fall. She is able to complete all exercises as instructed and added some prone strengthening today. Encouraged pt to continue with her HEP and therapist will continue to progress her strength/ROM. She would like to be seen in-person next week.Pt will benefit from PT services to address deficits in strength,mobility, and painin order to return to full function at home.                        PT Short Term Goals - 03/19/19 1436      PT SHORT TERM GOAL #1   Title  Pt will be independent with HEP in order to improve strength and decrease pain in order to improve pain-free function at home.    Time  4    Period  Weeks     Status  Achieved        PT Long Term Goals - 03/19/19 1437      PT LONG TERM GOAL #1   Title  Pt will decrease quick DASH score by at least 8% in order to demonstrate clinically significant reduction in disability.    Baseline  09/30/18: 84.1%; 10/30/18: 56.8%; 11/27/18: 45.45%; 12/23/18: 40.9%;     Time  8    Period  Weeks    Status  Achieved      PT LONG TERM GOAL #2   Title  Pt will decrease worst pain as reported on NPRS by at least 3 points in order to demonstrate clinically significant reduction in pain.    Baseline  09/30/18: worst: 9/10; 10/30/18: 6/10;    Time  8    Period  Weeks    Status  Achieved      PT LONG TERM GOAL #3   Title  Pt will improve PROM of R shoulder to within 10 degrees of R shoulder in order to improve functional use of R shoulder for ADLs/IADLs.    Baseline  R/L 129/150 Shoulder flexion, 100/165 Shoulder abduction, 50/93 Shoulder external rotation, 50/70 Shoulder internal rotation; 10/30/18: R/L 140/150 Shoulder flexion, 132/165 Shoulder abduction, 82/93 Shoulder external rotation, 70/70 Shoulder internal rotation; 12/23/18:  R/L 155/150 Shoulder flexion, 134/165 Shoulder abduction, 89/93 Shoulder external rotation, 70/70 Shoulder internal rotation, 01/21/19: R/L 154/150 Shoulder flexion, 136/165 Shoulder abduction, 85/93 Shoulder external rotation, 70/70 Shoulder internal rotation; 03/19/19: Unable to test over telemedicine    Time  8    Period  Weeks    Status  Partially Met    Target Date  05/14/19      PT LONG TERM GOAL #4   Title  Pt will decrease worst pain as reported on NPRS by at least 3 points (3 or less) in order to demonstrate clinically significant reduction in pain.    Baseline  10/30/18: 6/10; 11/27/18: 4/10; 12/23/18: 5/10 (improved pain frequency); 01/21/19: 5/10 (pain is less frequent); 03/19/19: 4/10;  Time  8    Period  Weeks    Status  Partially Met    Target Date  05/14/19      PT LONG TERM GOAL #5   Title  Pt will decrease quick  DASH score by at least 8% in order to demonstrate clinically significant reduction in disability.    Baseline  11/27/18: 45.45%; 12/23/18: 40.9%; 01/21/19: 29.5%,     Time  8    Period  Weeks    Status  Achieved      PT LONG TERM GOAL #6   Title  Pt will improve pain-free R shoulder strength for flexion and abduction so that it is symmetrical with L shoulder in order to return to full function at home    Baseline  12/23/18: R/L 4-/4+ Shoulder flexion, 4/4+ Shoulder abduction, both painful on R side; 01/21/19: R/L 4/4+ Shoulder flexion, 4/4+ Shoulder abduction, both painful on R side; 03/19/19: Unable to test over telemedicine    Time  8    Period  Weeks    Status  On-going    Target Date  05/14/19            Plan - 03/27/19 1644    Clinical Impression Statement  Pt demonstrates good motivation during session.She demonstrates AROM for flexion and abduction which is not painful and appears unchanged from last session. Pt encouraged to contact Dr. Mayer Camel to notify him of her fall. She is able to complete all exercises as instructed and added some prone strengthening today. Encouraged pt to continue with her HEP and therapist will continue to progress her strength/ROM. She would like to be seen in-person next week.Pt will benefit from PT services to address deficits in strength,mobility, and painin order to return to full function at home.    Rehab Potential  Good    PT Frequency  2x / week    PT Duration  8 weeks    PT Treatment/Interventions  ADLs/Self Care Home Management;Aquatic Therapy;Canalith Repostioning;Cryotherapy;Electrical Stimulation;Iontophoresis 20m/ml Dexamethasone;Moist Heat;Traction;Ultrasound;Contrast Bath;DME Instruction;Gait training;Stair training;Functional mobility training;Therapeutic activities;Therapeutic exercise;Balance training;Neuromuscular re-education;Patient/family education;Manual techniques;Passive range of motion;Dry needling;Splinting;Taping;Vestibular    PT  Next Visit Plan  Review HEP and progress as needed, Progress AAROM/AROM/strengthening of shoulder as tolerated without pain    PT Home Exercise Plan  Cervical retractions, Scapular retractions, R upper trap stretch, supine canes for AAROM flexion with hands together progressing to AROM supine flexion, AROM sidelying shoulder abduction, AROM sidelying shoulder ER, seated pulleys for flexion and scaption ([id number])    Consulted and Agree with Plan of Care  Patient       Patient will benefit from skilled therapeutic intervention in order to improve the following deficits and impairments:  Decreased strength, Decreased range of motion, Impaired UE functional use, Pain  Visit Diagnosis: Acute pain of right shoulder  Stiffness of right shoulder, not elsewhere classified  Muscle weakness (generalized)     Problem List Patient Active Problem List   Diagnosis Date Noted  . Chronic diarrhea 01/14/2019  . Malignant melanoma ([place] 11/23/2016  . Urge incontinence 11/23/2016  . Atypical mole 09/22/2016  . Slurring of speech 05/12/2016  . Traumatic arthritis elbow 05/04/2015  . Left foot pain 12/31/2014  . Severe sprain of left ankle 12/31/2014  . Osteopenia 09/15/2014  . History of TIA (transient ischemic attack) 05/19/2014  . Fracture of coronoid process of ulna, left, closed 04/25/2014  . Ingrown toenail 01/15/2014  . Obesity (BMI 30-39.9) 10/08/2013  . OA (osteoarthritis) of knee 07/07/2013  .  Chronic low back pain 05/09/2013  . Obstructive sleep apnea 05/09/2013  . GERD (gastroesophageal reflux disease) 05/09/2013  . Hiatal hernia 05/09/2013  . Fibromyalgia syndrome 05/09/2013  . Depression, major, in partial remission ([place]) 05/09/2013  . RLS (restless legs syndrome) 05/09/2013  . Diabetes mellitus with no complication ([place]) 26/41/5830  . High cholesterol 05/09/2013  . 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 Family history of colon cancer 05/09/2013  . Allergic rhinitis 05/09/2013  .  Lateral meniscal tear 09/04/2012  . CAD (coronary artery disease) 09/27/2011  . HTN (hypertension) 09/27/2011  . Paroxysmal A-fib ([place]) 09/27/2011   Phillips Grout PT, DPT, GCS  , 03/27/2019, 4:46 PM  [place] MAIN [facility] SERVICES [address] [place], [place], [address] Phone: [phone]   Fax:  [phone]  Name: Morgan [name] MRN: [id number] Date of Birth: April 18, 1941

## 2019-04-03 ENCOUNTER — Ambulatory Visit: Payer: Medicare Other

## 2019-04-03 ENCOUNTER — Other Ambulatory Visit: Payer: Self-pay | Admitting: Family Medicine

## 2019-04-03 ENCOUNTER — Other Ambulatory Visit: Payer: Self-pay

## 2019-04-03 DIAGNOSIS — M25611 Stiffness of right shoulder, not elsewhere classified: Secondary | ICD-10-CM

## 2019-04-03 DIAGNOSIS — M6281 Muscle weakness (generalized): Secondary | ICD-10-CM

## 2019-04-03 DIAGNOSIS — M25511 Pain in right shoulder: Secondary | ICD-10-CM

## 2019-04-03 NOTE — Telephone Encounter (Signed)
Last office visit 02/25/2019 for HTN & Chronic Diarrhea.  Last refilled 10/02/2018 for #180 with 1 refill.  CPE scheduled for 01/27/2020.

## 2019-04-03 NOTE — Therapy (Signed)
[place] MAIN [facility] SERVICES [address] [place], [place], [address] Phone: [phone]   Fax:  [phone]  Physical Therapy Treatment  Patient Details  Name: Morgan [name] MRN: [id number] Date of Birth: 03/21/1941 Referring Provider (PT): Dr. Mayer Camel   Encounter Date: 04/03/2019  PT End of Session - 04/03/19 1340    Visit Number  28    Number of Visits  51    Date for PT Re-Evaluation  05/14/19    Authorization Type  Goals updated via telehealth on 03/19/19; 09/30/18 (eval)    PT Start Time  1340    PT Stop Time  1423    PT Time Calculation (min)  43 min    Activity Tolerance  Patient tolerated treatment well    Behavior During Therapy  [name] for tasks assessed/performed       Past Medical History:  Diagnosis Date  . Angina   . Arthritis   . Atrial fibrillation (HCC)    ASPIRIN FOR BLOOD THINNER  . Atypical mole 09/22/2016  . Bulging discs    lumbar   . Cancer ([place])    melanoma  . Complication of anesthesia    pt states has choking sensation with ET tube   . Coronary artery disease   . Depression   . Diabetes mellitus    diet controlled/on meds  . Fibromyalgia   . GERD (gastroesophageal reflux disease)   . H/O hiatal hernia   . Headache(784.0)    "recurring"  . High cholesterol   . Hyperlipidemia   . Hypertension   . 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 Migraines    "til ~ 1980"  . PONV (postoperative nausea and vomiting)   . Restless leg syndrome   . Sciatic nerve pain    "from pinched nerve"  . Sleep apnea    uses CPAP  . Weakness of right side of body    "I've had PT for it; they don't know what it's from".  CORTISONE INJECTION INTO BACK 08/30/12    Past Surgical History:  Procedure Laterality Date  . [place] STUDY N/A 12/29/2015   Procedure: [place] STUDY;  Surgeon: Manus Gunning, MD;  Location: [facility];  Service: Gastroenterology;  Laterality: N/A;  . CARPAL TUNNEL RELEASE Left 07/02/2015    Procedure: CARPAL TUNNEL RELEASE;  Surgeon: Frederik Pear, MD;  Location: [place];  Service: Orthopedics;  Laterality: Left;  . CATARACT EXTRACTION, BILATERAL Bilateral    Oct and Nov 2017  . CORONARY ANGIOPLASTY WITH STENT PLACEMENT  06/2011   "1"  . CORONARY ARTERY BYPASS GRAFT  2005   CABG X 2  . DILATION AND CURETTAGE OF UTERUS     "more than once"  . ELBOW ARTHROSCOPY Left 07/02/2015   Procedure: ARTHROSCOPY LEFT ELBOW WITH DEBRIDEMENT AND REMOVAL LOOSE BODY;  Surgeon: Frederik Pear, MD;  Location: [place];  Service: Orthopedics;  Laterality: Left;  . ESOPHAGEAL MANOMETRY N/A 12/29/2015   Procedure: ESOPHAGEAL MANOMETRY (EM);  Surgeon: Manus Gunning, MD;  Location: [facility];  Service: Gastroenterology;  Laterality: N/A;  . FRACTURE SURGERY  ~ 2005   nose  . KNEE ARTHROSCOPY  09/04/2012   Procedure: ARTHROSCOPY KNEE;  Surgeon: Gearlean Alf, MD;  Location: [facility];  Service: Orthopedics;  Laterality: Right;  right knee arthroscopy with medial and lateral meniscus debridement  . 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 MOUTH SURGERY  2004   "  bone replacement; had cadavear bones put in; face was collapsing"  . NASAL SEPTUM SURGERY  ~ 1986  . TOTAL KNEE ARTHROPLASTY Right 07/07/2013   Procedure: RIGHT TOTAL KNEE ARTHROPLASTY;  Surgeon: Gearlean Alf, MD;  Location: [facility];  Service: Orthopedics;  Laterality: Right;    There were no vitals filed for this visit.  Subjective Assessment - 04/03/19 1338    Subjective  Pt states that her shoulder has been more sore since she had her fall last week. She has not contacted her orthopedist to notify him of the fall yet. She rates her R shoulder pain as a 5/10 currently. No specific questions upon arrival.     Pertinent History  On 08/03/18 pt was carrying a folding table and fell and right arm was under the handle got stuck between the door and the table. She landed on her R side.  Tried to get up and had to pull at her arm. Pt had immediate pain in her right shoulder. She waiteduntil Monday and went to see Dr. Edilia Bo her PCP. He took radiographs but didn't find any fractures. She continued to have pain so she called Dr. Damita Dunnings office, her orthopedist, and saw him that same week. He took additional radiographs and found a hairline fracture in her humeral shaft and a greater tuberosity fracture per patient report. She states that she is not healing well and has recurrent plain film radiographs. She is still wearing a sling and was encouraged not to perform any AROM of her right shoulder. Since her last visit with Dr. Mayer Camel on 09/20/18 she has had an additional fall and landed on her R shoulder. She reports additional bruising and pain in her R shoulder. She has a follow-up appt scheduled with her orthopedist for 10/15/18. 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 ROS negative for red flags.    Limitations  Lifting    Diagnostic tests  Plain film radiographs with decreased healing per pt report    Patient Stated Goals  Improve ability to use RUE and decrease pain    Currently in Pain?  Yes    Pain Score  5     Pain Location  Shoulder    Pain Orientation  Right    Pain Descriptors / Indicators  Sharp    Pain Onset  1 to 4 weeks ago   Post-fall pain   Pain Frequency  Intermittent         TREATMENT   Ther-ex UBE forward x 3 minutes at start of session for warm-up during history; Supine R shoulder flexion 2# dumbbell (DB) x 10, decreased to 1# DB due to mild increase in pain for second set of 10 reps; Supine rhythmic stabs at 90 flexion with resistance at wrist 30s x 2; Supine serratus punch with gentle therapist resistance 2 x 10; Supine shoulder cirlces at 90 flexion 1# DB CW/CCW 2 x 10 each; L sidelying R shoulder abduction 1# DB 2 x 10; L sidelying R shoulder ER 1# DB 2 x 10; L sidelying R shoulder horizontal abduction 1# DB 2 x  10; Seated rows with green tband x 10 with cues for scapular depression and retraction; Seated pball roll-outs x 15;   Manual Therapy  R shoulder gentle PROM flexion, abduction, ER, and IR with end range holds, maintained pain-free motion; Gentle R shoulder oscillations to decrease guarding during PROM;    Pt educated throughout session about proper posture and technique with  exercises. Improved exercise technique, movement at target joints, use of target muscles after min to mod verbal cues;   Pt demonstrates good motivation during session.She demonstrates AROM/PROM for flexion, abduction, ER, and IR which appears unchanged from previous sessions. Slightly more guarding today however pain is no worse than before. Strength testing appears grossly intact throughout R shoulder without focal weakness. She is able to slightly increase resistance today during supine and sidelying strengthening. Pt encouraged again to contact Dr. Mayer Camel to notify him of her fall. She has a follow-up appointment next week. Encouraged pt to continue withher HEP and therapist will continue to progress her strength/ROM.Pt will benefit from PT services to address deficits in strength,mobility, and painin order to return to full function at home.                     PT Short Term Goals - 03/19/19 1436      PT SHORT TERM GOAL #1   Title  Pt will be independent with HEP in order to improve strength and decrease pain in order to improve pain-free function at home.    Time  4    Period  Weeks    Status  Achieved        PT Long Term Goals - 03/19/19 1437      PT LONG TERM GOAL #1   Title  Pt will decrease quick DASH score by at least 8% in order to demonstrate clinically significant reduction in disability.    Baseline  09/30/18: 84.1%; 10/30/18: 56.8%; 11/27/18: 45.45%; 12/23/18: 40.9%;     Time  8    Period  Weeks    Status  Achieved      PT LONG TERM GOAL #2   Title  Pt will decrease  worst pain as reported on NPRS by at least 3 points in order to demonstrate clinically significant reduction in pain.    Baseline  09/30/18: worst: 9/10; 10/30/18: 6/10;    Time  8    Period  Weeks    Status  Achieved      PT LONG TERM GOAL #3   Title  Pt will improve PROM of R shoulder to within 10 degrees of R shoulder in order to improve functional use of R shoulder for ADLs/IADLs.    Baseline  R/L 129/150 Shoulder flexion, 100/165 Shoulder abduction, 50/93 Shoulder external rotation, 50/70 Shoulder internal rotation; 10/30/18: R/L 140/150 Shoulder flexion, 132/165 Shoulder abduction, 82/93 Shoulder external rotation, 70/70 Shoulder internal rotation; 12/23/18:  R/L 155/150 Shoulder flexion, 134/165 Shoulder abduction, 89/93 Shoulder external rotation, 70/70 Shoulder internal rotation, 01/21/19: R/L 154/150 Shoulder flexion, 136/165 Shoulder abduction, 85/93 Shoulder external rotation, 70/70 Shoulder internal rotation; 03/19/19: Unable to test over telemedicine    Time  8    Period  Weeks    Status  Partially Met    Target Date  05/14/19      PT LONG TERM GOAL #4   Title  Pt will decrease worst pain as reported on NPRS by at least 3 points (3 or less) in order to demonstrate clinically significant reduction in pain.    Baseline  10/30/18: 6/10; 11/27/18: 4/10; 12/23/18: 5/10 (improved pain frequency); 01/21/19: 5/10 (pain is less frequent); 03/19/19: 4/10;    Time  8    Period  Weeks    Status  Partially Met    Target Date  05/14/19      PT LONG TERM GOAL #5   Title  Pt will decrease  quick DASH score by at least 8% in order to demonstrate clinically significant reduction in disability.    Baseline  11/27/18: 45.45%; 12/23/18: 40.9%; 01/21/19: 29.5%,     Time  8    Period  Weeks    Status  Achieved      PT LONG TERM GOAL #6   Title  Pt will improve pain-free R shoulder strength for flexion and abduction so that it is symmetrical with L shoulder in order to return to full function at home     Baseline  12/23/18: R/L 4-/4+ Shoulder flexion, 4/4+ Shoulder abduction, both painful on R side; 01/21/19: R/L 4/4+ Shoulder flexion, 4/4+ Shoulder abduction, both painful on R side; 03/19/19: Unable to test over telemedicine    Time  8    Period  Weeks    Status  On-going    Target Date  05/14/19            Plan - 04/03/19 1341    Clinical Impression Statement  Pt demonstrates good motivation during session.She demonstrates AROM/PROM for flexion, abduction, ER, and IR which appears unchanged from previous sessions. Slightly more guarding today however pain is no worse than before. Strength testing appears grossly intact throughout R shoulder without focal weakness. She is able to slightly increase resistance today during supine and sidelying strengthening. Pt encouraged again to contact Dr. Mayer Camel to notify him of her fall. She has a follow-up appointment next week. Encouraged pt to continue withher HEP and therapist will continue to progress her strength/ROM.Pt will benefit from PT services to address deficits in strength,mobility, and painin order to return to full function at home.    Rehab Potential  Good    PT Frequency  2x / week    PT Duration  8 weeks    PT Treatment/Interventions  ADLs/Self Care Home Management;Aquatic Therapy;Canalith Repostioning;Cryotherapy;Electrical Stimulation;Iontophoresis '4mg'$ /ml Dexamethasone;Moist Heat;Traction;Ultrasound;Contrast Bath;DME Instruction;Gait training;Stair training;Functional mobility training;Therapeutic activities;Therapeutic exercise;Balance training;Neuromuscular re-education;Patient/family education;Manual techniques;Passive range of motion;Dry needling;Splinting;Taping;Vestibular    PT Next Visit Plan  Review HEP and progress as needed, Progress AAROM/AROM/strengthening of shoulder as tolerated without pain    PT Home Exercise Plan  Cervical retractions, Scapular retractions, R upper trap stretch, supine canes for AAROM flexion with hands  together progressing to AROM supine flexion, AROM sidelying shoulder abduction, AROM sidelying shoulder ER, seated pulleys for flexion and scaption ([id number])    Consulted and Agree with Plan of Care  Patient       Patient will benefit from skilled therapeutic intervention in order to improve the following deficits and impairments:  Decreased strength, Decreased range of motion, Impaired UE functional use, Pain  Visit Diagnosis: Acute pain of right shoulder  Stiffness of right shoulder, not elsewhere classified  Muscle weakness (generalized)     Problem List Patient Active Problem List   Diagnosis Date Noted  . Chronic diarrhea 01/14/2019  . Malignant melanoma ([place]) 11/23/2016  . Urge incontinence 11/23/2016  . Atypical mole 09/22/2016  . Slurring of speech 05/12/2016  . Traumatic arthritis elbow 05/04/2015  . Left foot pain 12/31/2014  . Severe sprain of left ankle 12/31/2014  . Osteopenia 09/15/2014  . History of TIA (transient ischemic attack) 05/19/2014  . Fracture of coronoid process of ulna, left, closed 04/25/2014  . Ingrown toenail 01/15/2014  . Obesity (BMI 30-39.9) 10/08/2013  . OA (osteoarthritis) of knee 07/07/2013  . Chronic low back pain 05/09/2013  . Obstructive sleep apnea 05/09/2013  . GERD (gastroesophageal reflux disease) 05/09/2013  . Hiatal hernia 05/09/2013  .  Fibromyalgia syndrome 05/09/2013  . Depression, major, in partial remission ([place]) 05/09/2013  . RLS (restless legs syndrome) 05/09/2013  . Diabetes mellitus with no complication ([place]) 97/58/8325  . High cholesterol 05/09/2013  . 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 Family history of colon cancer 05/09/2013  . Allergic rhinitis 05/09/2013  . Lateral meniscal tear 09/04/2012  . CAD (coronary artery disease) 09/27/2011  . HTN (hypertension) 09/27/2011  . Paroxysmal A-fib ([place]) 09/27/2011   Phillips Grout PT, DPT, GCS  Darroll Bredeson 04/04/2019, 8:29 AM  [place] MAIN [facility] SERVICES [address] [place], [place], [address] Phone: [phone]   Fax:  [phone]  Name: Morgan [name] MRN: [id number] Date of Birth: 1941-10-14

## 2019-04-09 ENCOUNTER — Ambulatory Visit: Payer: Medicare Other

## 2019-04-09 ENCOUNTER — Other Ambulatory Visit: Payer: Self-pay

## 2019-04-09 DIAGNOSIS — M25611 Stiffness of right shoulder, not elsewhere classified: Secondary | ICD-10-CM

## 2019-04-09 DIAGNOSIS — M25511 Pain in right shoulder: Secondary | ICD-10-CM

## 2019-04-09 DIAGNOSIS — M6281 Muscle weakness (generalized): Secondary | ICD-10-CM

## 2019-04-09 NOTE — Therapy (Signed)
[place] MAIN [facility] SERVICES [address] [place], [place], [address] Phone: [phone]   Fax:  [phone]  Physical Therapy Treatment  Patient Details  Name: Morgan [name] MRN: [id number] Date of Birth: [birth year] Referring Provider (PT): Dr. Mayer Camel   Encounter Date: 04/09/2019  PT End of Session - 04/09/19 1636    Visit Number  29    Number of Visits  65    Date for PT Re-Evaluation  05/14/19    Authorization Type  Goals updated via telehealth on 03/19/19; 09/30/18 (eval)    PT Start Time  1633    PT Stop Time  1715    PT Time Calculation (min)  42 min    Activity Tolerance  Patient tolerated treatment well    Behavior During Therapy  [facility] for tasks assessed/performed       Past Medical History:  Diagnosis Date  . Angina   . Arthritis   . Atrial fibrillation (HCC)    ASPIRIN FOR BLOOD THINNER  . Atypical mole 09/22/2016  . Bulging discs    lumbar   . Cancer ([place])    melanoma  . Complication of anesthesia    pt states has choking sensation with ET tube   . Coronary artery disease   . Depression   . Diabetes mellitus    diet controlled/on meds  . Fibromyalgia   . GERD (gastroesophageal reflux disease)   . H/O hiatal hernia   . Headache(784.0)    "recurring"  . High cholesterol   . Hyperlipidemia   . Hypertension   . 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 Migraines    "til ~ 1980"  . PONV (postoperative nausea and vomiting)   . Restless leg syndrome   . Sciatic nerve pain    "from pinched nerve"  . Sleep apnea    uses CPAP  . Weakness of right side of body    "I've had PT for it; they don't know what it's from".  CORTISONE INJECTION INTO BACK 08/30/12    Past Surgical History:  Procedure Laterality Date  . [place] STUDY N/A 12/29/2015   Procedure: [place] STUDY;  Surgeon: Manus Gunning, MD;  Location: [facility];  Service: Gastroenterology;  Laterality: N/A;  . CARPAL TUNNEL RELEASE Left 07/02/2015    Procedure: CARPAL TUNNEL RELEASE;  Surgeon: Frederik Pear, MD;  Location: [place];  Service: Orthopedics;  Laterality: Left;  . CATARACT EXTRACTION, BILATERAL Bilateral    Oct and Nov 2017  . CORONARY ANGIOPLASTY WITH STENT PLACEMENT  06/2011   "1"  . CORONARY ARTERY BYPASS GRAFT  2005   CABG X 2  . DILATION AND CURETTAGE OF UTERUS     "more than once"  . ELBOW ARTHROSCOPY Left 07/02/2015   Procedure: ARTHROSCOPY LEFT ELBOW WITH DEBRIDEMENT AND REMOVAL LOOSE BODY;  Surgeon: Frederik Pear, MD;  Location: [place];  Service: Orthopedics;  Laterality: Left;  . ESOPHAGEAL MANOMETRY N/A 12/29/2015   Procedure: ESOPHAGEAL MANOMETRY (EM);  Surgeon: Manus Gunning, MD;  Location: [facility];  Service: Gastroenterology;  Laterality: N/A;  . FRACTURE SURGERY  ~ 2005   nose  . KNEE ARTHROSCOPY  09/04/2012   Procedure: ARTHROSCOPY KNEE;  Surgeon: Gearlean Alf, MD;  Location: [facility];  Service: Orthopedics;  Laterality: Right;  right knee arthroscopy with medial and lateral meniscus debridement  . 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 MOUTH SURGERY  2004   "  bone replacement; had cadavear bones put in; face was collapsing"  . NASAL SEPTUM SURGERY  ~ 1986  . TOTAL KNEE ARTHROPLASTY Right 07/07/2013   Procedure: RIGHT TOTAL KNEE ARTHROPLASTY;  Surgeon: Gearlean Alf, MD;  Location: [facility];  Service: Orthopedics;  Laterality: Right;    There were no vitals filed for this visit.  Subjective Assessment - 04/09/19 1635    Subjective  Pt states that she is doing alright today. She rates her shoulder pain as a 2/10 and pain continues to increase with overhead motions. She has a follow-up appointment with orthopedics tomorrow. No specific questions or concerns at this time.     Pertinent History  On 08/03/18 pt was carrying a folding table and fell and right arm was under the handle got stuck between the door and the table. She landed on her R side.  Tried to get up and had to pull at her arm. Pt had immediate pain in her right shoulder. She waiteduntil Monday and went to see Dr. Edilia Bo her PCP. He took radiographs but didn't find any fractures. She continued to have pain so she called Dr. Damita Dunnings office, her orthopedist, and saw him that same week. He took additional radiographs and found a hairline fracture in her humeral shaft and a greater tuberosity fracture per patient report. She states that she is not healing well and has recurrent plain film radiographs. She is still wearing a sling and was encouraged not to perform any AROM of her right shoulder. Since her last visit with Dr. Mayer Camel on 09/20/18 she has had an additional fall and landed on her R shoulder. She reports additional bruising and pain in her R shoulder. She has a follow-up appt scheduled with her orthopedist for 10/15/18. 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 ROS negative for red flags.    Limitations  Lifting    Diagnostic tests  Plain film radiographs with decreased healing per pt report    Patient Stated Goals  Improve ability to use RUE and decrease pain    Currently in Pain?  Yes    Pain Score  2     Pain Location  Shoulder    Pain Orientation  Right    Pain Descriptors / Indicators  Sharp    Pain Type  Chronic pain    Pain Onset  1 to 4 weeks ago   Post-fall pain         TREATMENT   Ther-ex UBE forward/backwards x 2 minutes each at start of session for warm-up during history (2 minutes unbilled); UE ranger for flexion, abduction, and CW/CCW circles x 15 each; Standing R shoulder AROM flexion and abduction with verbal and tactile cues for scapular retraction and depression, mirror feedback x 10 each, mild pain reported at end range for both motions; Supine R shoulder flexion 2# dumbbell (DB) x 10; Supine shoulder cirlces at 90 flexion 2# DB CW/CCW 2 x 10 each; Supine rhythmic stabs at 90 flexion with resistance at wrist 30s x  2; Supine serratus punch with gentle therapist resistance 2 x 10; L sidelying R shoulder abduction 2# DB 2 x 10; L sidelying R shoulder ER 2# DB 2 x 10; L sidelying R shoulder horizontal abduction 1# DB 2 x 10; Supine D2 flexin/extension with manual resistance from therapist x 10 each; Supine chest press with cane and manual resistance from therapist; Prone R shoulder extension with 2# DB 2 x 10; Prone R shoulder "  T's" with 2# DB 2 x 10;   Pt educated throughout session about proper posture and technique with exercises. Improved exercise technique, movement at target joints, use of target muscles after min to mod verbal cues;   Pt demonstrates good motivation during session.She demonstrates AROM/PROM for flexion, abduction, ER, and IR which appears unchanged from previous sessions. She is able to slightly increase resistance today during supine and sidelying strengthening. Pt has a follow-up appointment with orthopedic surgeon tomorrow. Encouraged pt to continue withher HEP and therapist will continue to progress her strength/ROM.Pt will benefit from PT services to address deficits in strength,mobility, and painin order to return to full function at home.                       PT Short Term Goals - 03/19/19 1436      PT SHORT TERM GOAL #1   Title  Pt will be independent with HEP in order to improve strength and decrease pain in order to improve pain-free function at home.    Time  4    Period  Weeks    Status  Achieved        PT Long Term Goals - 03/19/19 1437      PT LONG TERM GOAL #1   Title  Pt will decrease quick DASH score by at least 8% in order to demonstrate clinically significant reduction in disability.    Baseline  09/30/18: 84.1%; 10/30/18: 56.8%; 11/27/18: 45.45%; 12/23/18: 40.9%;     Time  8    Period  Weeks    Status  Achieved      PT LONG TERM GOAL #2   Title  Pt will decrease worst pain as reported on NPRS by at least 3 points in  order to demonstrate clinically significant reduction in pain.    Baseline  09/30/18: worst: 9/10; 10/30/18: 6/10;    Time  8    Period  Weeks    Status  Achieved      PT LONG TERM GOAL #3   Title  Pt will improve PROM of R shoulder to within 10 degrees of R shoulder in order to improve functional use of R shoulder for ADLs/IADLs.    Baseline  R/L 129/150 Shoulder flexion, 100/165 Shoulder abduction, 50/93 Shoulder external rotation, 50/70 Shoulder internal rotation; 10/30/18: R/L 140/150 Shoulder flexion, 132/165 Shoulder abduction, 82/93 Shoulder external rotation, 70/70 Shoulder internal rotation; 12/23/18:  R/L 155/150 Shoulder flexion, 134/165 Shoulder abduction, 89/93 Shoulder external rotation, 70/70 Shoulder internal rotation, 01/21/19: R/L 154/150 Shoulder flexion, 136/165 Shoulder abduction, 85/93 Shoulder external rotation, 70/70 Shoulder internal rotation; 03/19/19: Unable to test over telemedicine    Time  8    Period  Weeks    Status  Partially Met    Target Date  05/14/19      PT LONG TERM GOAL #4   Title  Pt will decrease worst pain as reported on NPRS by at least 3 points (3 or less) in order to demonstrate clinically significant reduction in pain.    Baseline  10/30/18: 6/10; 11/27/18: 4/10; 12/23/18: 5/10 (improved pain frequency); 01/21/19: 5/10 (pain is less frequent); 03/19/19: 4/10;    Time  8    Period  Weeks    Status  Partially Met    Target Date  05/14/19      PT LONG TERM GOAL #5   Title  Pt will decrease quick DASH score by at least 8% in order to demonstrate clinically  significant reduction in disability.    Baseline  11/27/18: 45.45%; 12/23/18: 40.9%; 01/21/19: 29.5%,     Time  8    Period  Weeks    Status  Achieved      PT LONG TERM GOAL #6   Title  Pt will improve pain-free R shoulder strength for flexion and abduction so that it is symmetrical with L shoulder in order to return to full function at home    Baseline  12/23/18: R/L 4-/4+ Shoulder flexion, 4/4+  Shoulder abduction, both painful on R side; 01/21/19: R/L 4/4+ Shoulder flexion, 4/4+ Shoulder abduction, both painful on R side; 03/19/19: Unable to test over telemedicine    Time  8    Period  Weeks    Status  On-going    Target Date  05/14/19            Plan - 04/09/19 1636    Clinical Impression Statement  Pt demonstrates good motivation during session.She demonstrates AROM/PROM for flexion, abduction, ER, and IR which appears unchanged from previous sessions. She is able to slightly increase resistance today during supine and sidelying strengthening. Pt has a follow-up appointment with orthopedic surgeon tomorrow. Encouraged pt to continue withher HEP and therapist will continue to progress her strength/ROM.Pt will benefit from PT services to address deficits in strength,mobility, and painin order to return to full function at home.    Rehab Potential  Good    PT Frequency  2x / week    PT Duration  8 weeks    PT Treatment/Interventions  ADLs/Self Care Home Management;Aquatic Therapy;Canalith Repostioning;Cryotherapy;Electrical Stimulation;Iontophoresis '4mg'$ /ml Dexamethasone;Moist Heat;Traction;Ultrasound;Contrast Bath;DME Instruction;Gait training;Stair training;Functional mobility training;Therapeutic activities;Therapeutic exercise;Balance training;Neuromuscular re-education;Patient/family education;Manual techniques;Passive range of motion;Dry needling;Splinting;Taping;Vestibular    PT Next Visit Plan  Outcome measures and progress note, Review HEP and progress as needed, Progress AAROM/AROM/strengthening of shoulder as tolerated without pain    PT Home Exercise Plan  Cervical retractions, Scapular retractions, R upper trap stretch, supine canes for AAROM flexion with hands together progressing to AROM supine flexion, AROM sidelying shoulder abduction, AROM sidelying shoulder ER, seated pulleys for flexion and scaption ([id number])    Consulted and Agree with Plan of Care  Patient        Patient will benefit from skilled therapeutic intervention in order to improve the following deficits and impairments:  Decreased strength, Decreased range of motion, Impaired UE functional use, Pain, Impaired vision/preception  Visit Diagnosis: Acute pain of right shoulder  Stiffness of right shoulder, not elsewhere classified  Muscle weakness (generalized)     Problem List Patient Active Problem List   Diagnosis Date Noted  . Chronic diarrhea 01/14/2019  . Malignant melanoma ([place]) 11/23/2016  . Urge incontinence 11/23/2016  . Atypical mole 09/22/2016  . Slurring of speech 05/12/2016  . Traumatic arthritis elbow 05/04/2015  . Left foot pain 12/31/2014  . Severe sprain of left ankle 12/31/2014  . Osteopenia 09/15/2014  . History of TIA (transient ischemic attack) 05/19/2014  . Fracture of coronoid process of ulna, left, closed 04/25/2014  . Ingrown toenail 01/15/2014  . Obesity (BMI 30-39.9) 10/08/2013  . OA (osteoarthritis) of knee 07/07/2013  . Chronic low back pain 05/09/2013  . Obstructive sleep apnea 05/09/2013  . GERD (gastroesophageal reflux disease) 05/09/2013  . Hiatal hernia 05/09/2013  . Fibromyalgia syndrome 05/09/2013  . Depression, major, in partial remission ([place]) 05/09/2013  . RLS (restless legs syndrome) 05/09/2013  . Diabetes mellitus with no complication ([place]) 92/09/9416  . High cholesterol 05/09/2013  . Idiopathic  angioedema 05/09/2013  . Family history of colon cancer 05/09/2013  . Allergic rhinitis 05/09/2013  . Lateral meniscal tear 09/04/2012  . CAD (coronary artery disease) 09/27/2011  . HTN (hypertension) 09/27/2011  . Paroxysmal A-fib ([place]) 09/27/2011   Phillips Grout PT, DPT, GCS  Bobbi Kozakiewicz 04/10/2019, 8:10 AM  [place] MAIN [facility] SERVICES [address] [place], [place], [address] Phone: [phone]   Fax:  [phone]  Name: Morgan [name] MRN: [id number] Date of Birth:  04/07/1941

## 2019-04-15 ENCOUNTER — Other Ambulatory Visit: Payer: Self-pay | Admitting: Family Medicine

## 2019-04-17 ENCOUNTER — Ambulatory Visit: Payer: Medicare Other | Attending: Orthopedic Surgery

## 2019-04-17 ENCOUNTER — Other Ambulatory Visit: Payer: Self-pay

## 2019-04-17 DIAGNOSIS — M6281 Muscle weakness (generalized): Secondary | ICD-10-CM | POA: Diagnosis present

## 2019-04-17 DIAGNOSIS — M25511 Pain in right shoulder: Secondary | ICD-10-CM | POA: Diagnosis not present

## 2019-04-17 DIAGNOSIS — M25611 Stiffness of right shoulder, not elsewhere classified: Secondary | ICD-10-CM | POA: Diagnosis present

## 2019-04-17 NOTE — Therapy (Signed)
[place] MAIN [facility] SERVICES [address] [place], [place], [address] Phone: [phone]   Fax:  [phone]  Physical Therapy Treatment/ Physical Therapy Progress Note   Dates of reporting period  12/25/18  to  04/17/19  Patient Details  Name: Morgan [name] MRN: [id number] Date of Birth: 04/27/1941 Referring Provider (Morgan [name]): Dr. Mayer Camel   Encounter Date: 04/17/2019  Morgan [name] End of Session - 04/17/19 0840    Visit Number  30    Number of Visits  65    Date for Morgan [name] Re-Evaluation  05/14/19    Authorization Type  6/4 goals updated    Morgan [name] Start Time  0836    Morgan [name] Stop Time  0916    Morgan [name] Time Calculation (min)  40 min    Activity Tolerance  Patient tolerated treatment well    Behavior During Therapy  [facility] for tasks assessed/performed       Past Medical History:  Diagnosis Date  . Angina   . Arthritis   . Atrial fibrillation (HCC)    ASPIRIN FOR BLOOD THINNER  . Atypical mole 09/22/2016  . Bulging discs    lumbar   . Cancer ([place])    melanoma  . Complication of anesthesia    Morgan [name] states has choking sensation with ET tube   . Coronary artery disease   . Depression   . Diabetes mellitus    diet controlled/on meds  . Fibromyalgia   . GERD (gastroesophageal reflux disease)   . H/O hiatal hernia   . Headache(784.0)    "recurring"  . High cholesterol   . Hyperlipidemia   . Hypertension   . 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 Migraines    "til ~ 1980"  . PONV (postoperative nausea and vomiting)   . Restless leg syndrome   . Sciatic nerve pain    "from pinched nerve"  . Sleep apnea    uses CPAP  . Weakness of right side of body    "I've had Morgan [name] for it; they don't know what it's from".  CORTISONE INJECTION INTO BACK 08/30/12    Past Surgical History:  Procedure Laterality Date  . [place] STUDY N/A 12/29/2015   Procedure: [place] STUDY;  Surgeon: Manus Gunning, MD;  Location: [facility];  Service: Gastroenterology;  Laterality: N/A;  .  CARPAL TUNNEL RELEASE Left 07/02/2015   Procedure: CARPAL TUNNEL RELEASE;  Surgeon: Frederik Pear, MD;  Location: [place];  Service: Orthopedics;  Laterality: Left;  . CATARACT EXTRACTION, BILATERAL Bilateral    Oct and Nov 2017  . CORONARY ANGIOPLASTY WITH STENT PLACEMENT  06/2011   "1"  . CORONARY ARTERY BYPASS GRAFT  2005   CABG X 2  . DILATION AND CURETTAGE OF UTERUS     "more than once"  . ELBOW ARTHROSCOPY Left 07/02/2015   Procedure: ARTHROSCOPY LEFT ELBOW WITH DEBRIDEMENT AND REMOVAL LOOSE BODY;  Surgeon: Frederik Pear, MD;  Location: [place];  Service: Orthopedics;  Laterality: Left;  . ESOPHAGEAL MANOMETRY N/A 12/29/2015   Procedure: ESOPHAGEAL MANOMETRY (EM);  Surgeon: Manus Gunning, MD;  Location: [facility];  Service: Gastroenterology;  Laterality: N/A;  . FRACTURE SURGERY  ~ 2005   nose  . KNEE ARTHROSCOPY  09/04/2012   Procedure: ARTHROSCOPY KNEE;  Surgeon: Gearlean Alf, MD;  Location: [facility];  Service: Orthopedics;  Laterality: Right;  right knee arthroscopy with medial and lateral meniscus debridement  . MELANOMA EXCISION Right 10/13/2016  right side of neck  . MOUTH SURGERY  2004   "bone replacement; had cadavear bones put in; face was collapsing"  . NASAL SEPTUM SURGERY  ~ 1986  . TOTAL KNEE ARTHROPLASTY Right 07/07/2013   Procedure: RIGHT TOTAL KNEE ARTHROPLASTY;  Surgeon: Gearlean Alf, MD;  Location: [facility];  Service: Orthopedics;  Laterality: Right;    There were no vitals filed for this visit.  Subjective Assessment - 04/17/19 0839    Subjective  Patient went to ortho since last session. Had a cortisone shot, reports it was more difficult to have shot done.     Pertinent History  On 08/03/18 Morgan [name] was carrying a folding table and fell and right arm was under the handle got stuck between the door and the table. She landed on her R side. Tried to get up and had to pull at her arm. Morgan [name] had immediate pain in her right shoulder.  She waiteduntil Monday and went to see Dr. Edilia Bo her PCP. He took radiographs but didn't find any fractures. She continued to have pain so she called Dr. Damita Dunnings office, her orthopedist, and saw him that same week. He took additional radiographs and found a hairline fracture in her humeral shaft and a greater tuberosity fracture per patient report. She states that she is not healing well and has recurrent plain film radiographs. She is still wearing a sling and was encouraged not to perform any AROM of her right shoulder. Since her last visit with Dr. Mayer Camel on 09/20/18 she has had an additional fall and landed on her R shoulder. She reports additional bruising and pain in her R shoulder. She has a follow-up appt scheduled with her orthopedist for 10/15/18. 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 ROS negative for red flags.    Limitations  Lifting    Diagnostic tests  Plain film radiographs with decreased healing per Morgan [name] report    Patient Stated Goals  Improve ability to use RUE and decrease pain    Currently in Pain?  No/denies    Pain Onset  --   Post-fall pain        Goals: ROM measured in goals Worst VAS: 5/10  QuickDash: 43%    Ther-ex  UBE forward/backwards x 2 minutes each at start of session for warm-up during history (2 minutes unbilled); AAROM supine flexion with rhythmic pertubations 10x 20 second holds, abduction 10 x 20 second holds, ER/IR 10x 20 second holds ( in scaption) Supine R shoulder flexion 2# dumbbell (DB) x 10; Supine shoulder circles at 90 flexion 2# DB with contact assistance 2 x 10 each;(clockwise, counterclockwise) Supine rhythmic stabs at 90 flexion with resistance at wrist 30s x 2; Supine serratus punch with gentle therapist resistance 2 x 10; Supine D2 flexin/extension with manual resistance from therapist x 10 each; Scapular protraction/retraction with overpressure from Morgan [name] for obtaining neutral scapular alignment with seated  flexion 15x  Rhythmic rotation with scapular depression and retraction for pain reduction and relief  Seated AAROM flexion with dowel 10x, limited compensatory patterning noted.      Morgan [name] educated throughout session about proper posture and technique with exercises. Improved exercise technique, movement at target joints, use of target muscles after min to mod verbal cues;       Patient's condition has the potential to improve in response to therapy. Maximum improvement is yet to be obtained. The anticipated improvement is attainable and reasonable in a generally predictable time.  Patient reports she feels she is improving with increasing range and less pain. Still is challenged with bringing her arm behind her back.                            Morgan [name] Education - 04/17/19 0840    Education Details  exercise technique, stability     Person(s) Educated  Patient    Methods  Explanation;Demonstration;Tactile cues;Verbal cues    Comprehension  Verbalized understanding;Returned demonstration;Verbal cues required;Tactile cues required       Morgan [name] Short Term Goals - 04/17/19 0904      Morgan [name] SHORT TERM GOAL #1   Title  Morgan [name] will be independent with HEP in order to improve strength and decrease pain in order to improve pain-free function at home.    Time  4    Period  Weeks    Status  Achieved        Morgan [name] Long Term Goals - 04/17/19 0904      Morgan [name] LONG TERM GOAL #1   Title  Morgan [name] will decrease quick DASH score by at least 8% in order to demonstrate clinically significant reduction in disability.    Baseline  09/30/18: 84.1%; 10/30/18: 56.8%; 11/27/18: 45.45%; 12/23/18: 40.9%;     Time  8    Period  Weeks    Status  Achieved      Morgan [name] LONG TERM GOAL #2   Title  Morgan [name] will decrease worst pain as reported on NPRS by at least 3 points in order to demonstrate clinically significant reduction in pain.    Baseline  09/30/18: worst: 9/10; 10/30/18: 6/10;    Time  8    Period  Weeks    Status   Achieved      Morgan [name] LONG TERM GOAL #3   Title  Morgan [name] will improve PROM of R shoulder to within 10 degrees of R shoulder in order to improve functional use of R shoulder for ADLs/IADLs.    Baseline  R/L 129/150 Shoulder flexion, 100/165 Shoulder abduction, 50/93 Shoulder external rotation, 50/70 Shoulder internal rotation; 10/30/18: R/L 140/150 Shoulder flexion, 132/165 Shoulder abduction, 82/93 Shoulder external rotation, 70/70 Shoulder internal rotation; 12/23/18:  R/L 155/150 Shoulder flexion, 134/165 Shoulder abduction, 89/93 Shoulder external rotation, 70/70 Shoulder internal rotation, 01/21/19: R/L 154/150 Shoulder flexion, 136/165 Shoulder abduction, 85/93 Shoulder external rotation, 70/70 Shoulder internal rotation; 03/19/19: Unable to test over telemedicine 6/4: R: 128 flexion, 121 abduction, 78 shoulder ER, 70 shoulder IR    Time  8    Period  Weeks    Status  Partially Met    Target Date  05/14/19      Morgan [name] LONG TERM GOAL #4   Title  Morgan [name] will decrease worst pain as reported on NPRS by at least 3 points (3 or less) in order to demonstrate clinically significant reduction in pain.    Baseline  10/30/18: 6/10; 11/27/18: 4/10; 12/23/18: 5/10 (improved pain frequency); 01/21/19: 5/10 (pain is less frequent); 03/19/19: 4/10; 6/4: 5/10     Time  8    Period  Weeks    Status  Partially Met      Morgan [name] LONG TERM GOAL #5   Title  Morgan [name] will decrease quick DASH score by at least 8% in order to demonstrate clinically significant reduction in disability.    Baseline  11/27/18: 45.45%; 12/23/18: 40.9%; 01/21/19: 29.5%, 6/4: 43%     Time  8    Period  Weeks  Status  On-going    Target Date  05/14/19      Morgan [name] LONG TERM GOAL #6   Title  Morgan [name] will improve pain-free R shoulder strength for flexion and abduction so that it is symmetrical with L shoulder in order to return to full function at home    Baseline  12/23/18: R/L 4-/4+ Shoulder flexion, 4/4+ Shoulder abduction, both painful on R side; 01/21/19: R/L 4/4+ Shoulder  flexion, 4/4+ Shoulder abduction, both painful on R side; 03/19/19: Unable to test over telemedicine 6/4: 4-/4     Time  8    Period  Weeks    Status  On-going    Target Date  05/14/19            Plan - 04/17/19 1631    Clinical Impression Statement  Patient presents to physical therapy with good motivation. Patient has seen ortho and received a cortisone injection resulting in decreased pain. Decreased compensatory patterning with UE elevation noted with decreasing cueing required. Patient has noted improved ROM with use of weight and distraction. Patient fatigues with prolonged muscle activation.  Patient's condition has the potential to improve in response to therapy. Maximum improvement is yet to be obtained. The anticipated improvement is attainable and reasonable in a generally predictable time.  Patient reports she feels she is improving with increasing range and less pain. Still is challenged with bringing her arm behind her back.  Morgan [name] educated throughout session about proper posture and technique with exercises. Improved exercise technique, movement at target joints, use of target muscles after min to mod verbal cues;Morgan [name] will benefit from Morgan [name] services to address deficits in strength,mobility, and painin order to return to full function at home.    Rehab Potential  Good    Morgan [name] Frequency  2x / week    Morgan [name] Duration  8 weeks    Morgan [name] Treatment/Interventions  ADLs/Self Care Home Management;Aquatic Therapy;Canalith Repostioning;Cryotherapy;Electrical Stimulation;Iontophoresis 45m/ml Dexamethasone;Moist Heat;Traction;Ultrasound;Contrast Bath;DME Instruction;Gait training;Stair training;Functional mobility training;Therapeutic activities;Therapeutic exercise;Balance training;Neuromuscular re-education;Patient/family education;Manual techniques;Passive range of motion;Dry needling;Splinting;Taping;Vestibular    Morgan [name] Next Visit Plan  Outcome measures and progress note, Review HEP and progress as needed,  Progress AAROM/AROM/strengthening of shoulder as tolerated without pain    Morgan [name] Home Exercise Plan  Cervical retractions, Scapular retractions, R upper trap stretch, supine canes for AAROM flexion with hands together progressing to AROM supine flexion, AROM sidelying shoulder abduction, AROM sidelying shoulder ER, seated pulleys for flexion and scaption ([id number])    Consulted and Agree with Plan of Care  Patient       Patient will benefit from skilled therapeutic intervention in order to improve the following deficits and impairments:  Decreased strength, Decreased range of motion, Impaired UE functional use, Pain, Impaired vision/preception  Visit Diagnosis: Acute pain of right shoulder  Stiffness of right shoulder, not elsewhere classified  Muscle weakness (generalized)     Problem List Patient Active Problem List   Diagnosis Date Noted  . Chronic diarrhea 01/14/2019  . [place] 11/23[place]  . Urge incontinence 11/23/2016  . Atypical mole 09/22/2016  . Slurring of speech 05/12/2016  . Traumatic arthritis elbow 05/04/2015  . Left foot pain 12/31/2014  . Severe sprain of left ankle 12/31/2014  . Osteopenia 09/15/2014  . History of TIA (transient ischemic attack) 05/19/2014  . Fracture of coronoid process of ulna, left, closed 04/25/2014  . Ingrown toenail 01/15/2014  . Obesity (BMI 30-39.9) 10/08/2013  . OA (osteoarthritis) of knee 07/07/2013  . Chronic low back pain 05/09/2013  .  Obstructive sleep apnea 05/09/2013  . GERD (gastroesophageal reflux disease) 05/09/2013  . Hiatal hernia 05/09/2013  . Fibromyalgia syndrome 05/09/2013  . Depression, major, in partial remission ([place]) 05/09/2013  . RLS (restless legs syndrome) 05/09/2013  . Diabetes mellitus with no complication ([place]) 32/67/1245  . High cholesterol 05/09/2013  . 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 Family history of colon cancer 05/09/2013  . Allergic rhinitis 05/09/2013  . Lateral meniscal tear  09/04/2012  . CAD (coronary artery disease) 09/27/2011  . HTN (hypertension) 09/27/2011  . Paroxysmal A-fib ([place]) 09/27/2011   Morgan [name], Morgan [name], Morgan [name]   04/17/2019, 4:32 PM  [place] MAIN [facility] SERVICES [address] [place], [place], [address] Phone: [phone]   Fax:  [phone]  Name: Anjolaoluwa Siguenza MRN: [id number] Date of Birth: May 31, 1941

## 2019-04-18 ENCOUNTER — Other Ambulatory Visit: Payer: Self-pay | Admitting: Family Medicine

## 2019-04-18 NOTE — Telephone Encounter (Signed)
Last office visit 02/25/2019 for HTN & Chronic Diarrhea.  Last refilled 03/11/2019 for #60 with no refills. CPE schedule for 01/27/2020.

## 2019-04-21 ENCOUNTER — Ambulatory Visit: Payer: Medicare Other

## 2019-04-21 ENCOUNTER — Other Ambulatory Visit: Payer: Self-pay

## 2019-04-21 DIAGNOSIS — M25511 Pain in right shoulder: Secondary | ICD-10-CM | POA: Diagnosis not present

## 2019-04-21 DIAGNOSIS — M6281 Muscle weakness (generalized): Secondary | ICD-10-CM

## 2019-04-21 DIAGNOSIS — M25611 Stiffness of right shoulder, not elsewhere classified: Secondary | ICD-10-CM

## 2019-04-21 NOTE — Therapy (Signed)
[place] MAIN [facility] SERVICES [address] [place], [place], [address] Phone: [phone]   Fax:  [phone]  Physical Therapy Treatment  Patient Details  Name: Morgan [name] MRN: [id number] Date of Birth: 01-08-1941 Referring Provider (PT): Dr. Mayer Camel   Encounter Date: 04/21/2019  PT End of Session - 04/21/19 0943    Visit Number  31    Number of Visits  65    Date for PT Re-Evaluation  05/14/19    Authorization Type  6/4 goals updated    PT Start Time  0940    PT Stop Time  1022    PT Time Calculation (min)  42 min    Activity Tolerance  Patient tolerated treatment well    Behavior During Therapy  [facility] for tasks assessed/performed       Past Medical History:  Diagnosis Date  . Angina   . Arthritis   . Atrial fibrillation (HCC)    ASPIRIN FOR BLOOD THINNER  . Atypical mole 09/22/2016  . Bulging discs    lumbar   . Cancer ([place])    melanoma  . Complication of anesthesia    pt states has choking sensation with ET tube   . Coronary artery disease   . Depression   . Diabetes mellitus    diet controlled/on meds  . Fibromyalgia   . GERD (gastroesophageal reflux disease)   . H/O hiatal hernia   . Headache(784.0)    "recurring"  . High cholesterol   . Hyperlipidemia   . Hypertension   . 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 Migraines    "til ~ 1980"  . PONV (postoperative nausea and vomiting)   . Restless leg syndrome   . Sciatic nerve pain    "from pinched nerve"  . Sleep apnea    uses CPAP  . Weakness of right side of body    "I've had PT for it; they don't know what it's from".  CORTISONE INJECTION INTO BACK 08/30/12    Past Surgical History:  Procedure Laterality Date  . [place] STUDY N/A 12/29/2015   Procedure: [place] STUDY;  Surgeon: Manus Gunning, MD;  Location: [facility];  Service: Gastroenterology;  Laterality: N/A;  . CARPAL TUNNEL RELEASE Left 07/02/2015   Procedure: CARPAL TUNNEL RELEASE;  Surgeon:  Frederik Pear, MD;  Location: [place];  Service: Orthopedics;  Laterality: Left;  . CATARACT EXTRACTION, BILATERAL Bilateral    Oct and Nov 2017  . CORONARY ANGIOPLASTY WITH STENT PLACEMENT  06/2011   "1"  . CORONARY ARTERY BYPASS GRAFT  2005   CABG X 2  . DILATION AND CURETTAGE OF UTERUS     "more than once"  . ELBOW ARTHROSCOPY Left 07/02/2015   Procedure: ARTHROSCOPY LEFT ELBOW WITH DEBRIDEMENT AND REMOVAL LOOSE BODY;  Surgeon: Frederik Pear, MD;  Location: [place];  Service: Orthopedics;  Laterality: Left;  . ESOPHAGEAL MANOMETRY N/A 12/29/2015   Procedure: ESOPHAGEAL MANOMETRY (EM);  Surgeon: Manus Gunning, MD;  Location: [facility];  Service: Gastroenterology;  Laterality: N/A;  . FRACTURE SURGERY  ~ 2005   nose  . KNEE ARTHROSCOPY  09/04/2012   Procedure: ARTHROSCOPY KNEE;  Surgeon: Gearlean Alf, MD;  Location: [facility];  Service: Orthopedics;  Laterality: Right;  right knee arthroscopy with medial and lateral meniscus debridement  . 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 MOUTH SURGERY  2004   "bone replacement; had cadavear  bones put in; face was collapsing"  . NASAL SEPTUM SURGERY  ~ 1986  . TOTAL KNEE ARTHROPLASTY Right 07/07/2013   Procedure: RIGHT TOTAL KNEE ARTHROPLASTY;  Surgeon: Gearlean Alf, MD;  Location: [facility];  Service: Orthopedics;  Laterality: Right;    There were no vitals filed for this visit.  Subjective Assessment - 04/21/19 0942    Subjective  Pt reports that she has been sore since the steroid shot in her R shoulder. Otherwise no changes since last session. No specific questions at this time. 4/10 R shoulder pain upon arrival.     Pertinent History  On 08/03/18 pt was carrying a folding table and fell and right arm was under the handle got stuck between the door and the table. She landed on her R side. Tried to get up and had to pull at her arm. Pt had immediate pain in her right shoulder. She  waiteduntil Monday and went to see Dr. Edilia Bo her PCP. He took radiographs but didn't find any fractures. She continued to have pain so she called Dr. Damita Dunnings office, her orthopedist, and saw him that same week. He took additional radiographs and found a hairline fracture in her humeral shaft and a greater tuberosity fracture per patient report. She states that she is not healing well and has recurrent plain film radiographs. She is still wearing a sling and was encouraged not to perform any AROM of her right shoulder. Since her last visit with Dr. Mayer Camel on 09/20/18 she has had an additional fall and landed on her R shoulder. She reports additional bruising and pain in her R shoulder. She has a follow-up appt scheduled with her orthopedist for 10/15/18. 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 ROS negative for red flags.    Limitations  Lifting    Diagnostic tests  Plain film radiographs with decreased healing per pt report    Patient Stated Goals  Improve ability to use RUE and decrease pain    Currently in Pain?  Yes    Pain Score  4     Pain Location  Shoulder    Pain Orientation  Right    Pain Descriptors / Indicators  Aching    Pain Type  Chronic pain    Pain Radiating Towards  Down R upper arm    Pain Onset  More than a month ago   Post-fall pain   Pain Frequency  Intermittent         TREATMENT    Manual Therapy  R shoulder gentle PROM flexion, abduction, ER, and IR with end range holds, maintained pain-free motion; Gentle R shoulder oscillations to decrease guarding during PROM;  R shoulder A/P grade I mobs at neutral 30s/bout x 3 bouts; R shoulder inferior grade I mobs at 90 abduction 30s/bout x 3 bouts; Attempted cross-body stretch x 20s but discontinued secondary to pain;   Ther-ex UBE forward/backwards x 2 minutes eachat start of session for warm-up during history: Supine rhythmic stabs at 90 flexion with resistance at wrist x 30s; Supine R  shoulder flexion 2# dumbbell (DB) x 10; Supine shoulder circles at 90 flexion2# DB with contact assistance 2 x 10 each;(clockwise, counterclockwise) Supine serratus punch with gentle therapist resistance x 10; Supine chest press with cane and manual resistance from therapist; L sidelying R shoulder abduction2# DB 2 x 10; L sidelying R shoulder ER2# DB 2 x 10; Prone R shoulder extension with 2# DB 2 x 10;  Prone R shoulder "T's" with unweighted 2 x 10;   Pt educated throughout session about proper posture and technique with exercises. Improved exercise technique, movement at target joints, use of target muscles after min to mod verbal cues;   Pt demonstrates good motivation during session. Slightly more guarding today however pain is no worse than before. She is able to complete all exercises as instructed. Weakness in prone horizontal abduction. Encouraged pt to continue withher HEP and therapist will continue to progress her strength/ROM.Pt will benefit from PT services to address deficits in strength,mobility, and painin order to return to full function at home.                   PT Short Term Goals - 04/17/19 0904      PT SHORT TERM GOAL #1   Title  Pt will be independent with HEP in order to improve strength and decrease pain in order to improve pain-free function at home.    Time  4    Period  Weeks    Status  Achieved        PT Long Term Goals - 04/17/19 0904      PT LONG TERM GOAL #1   Title  Pt will decrease quick DASH score by at least 8% in order to demonstrate clinically significant reduction in disability.    Baseline  09/30/18: 84.1%; 10/30/18: 56.8%; 11/27/18: 45.45%; 12/23/18: 40.9%;     Time  8    Period  Weeks    Status  Achieved      PT LONG TERM GOAL #2   Title  Pt will decrease worst pain as reported on NPRS by at least 3 points in order to demonstrate clinically significant reduction in pain.    Baseline  09/30/18: worst: 9/10;  10/30/18: 6/10;    Time  8    Period  Weeks    Status  Achieved      PT LONG TERM GOAL #3   Title  Pt will improve PROM of R shoulder to within 10 degrees of R shoulder in order to improve functional use of R shoulder for ADLs/IADLs.    Baseline  R/L 129/150 Shoulder flexion, 100/165 Shoulder abduction, 50/93 Shoulder external rotation, 50/70 Shoulder internal rotation; 10/30/18: R/L 140/150 Shoulder flexion, 132/165 Shoulder abduction, 82/93 Shoulder external rotation, 70/70 Shoulder internal rotation; 12/23/18:  R/L 155/150 Shoulder flexion, 134/165 Shoulder abduction, 89/93 Shoulder external rotation, 70/70 Shoulder internal rotation, 01/21/19: R/L 154/150 Shoulder flexion, 136/165 Shoulder abduction, 85/93 Shoulder external rotation, 70/70 Shoulder internal rotation; 03/19/19: Unable to test over telemedicine 6/4: R: 128 flexion, 121 abduction, 78 shoulder ER, 70 shoulder IR    Time  8    Period  Weeks    Status  Partially Met    Target Date  05/14/19      PT LONG TERM GOAL #4   Title  Pt will decrease worst pain as reported on NPRS by at least 3 points (3 or less) in order to demonstrate clinically significant reduction in pain.    Baseline  10/30/18: 6/10; 11/27/18: 4/10; 12/23/18: 5/10 (improved pain frequency); 01/21/19: 5/10 (pain is less frequent); 03/19/19: 4/10; 6/4: 5/10     Time  8    Period  Weeks    Status  Partially Met      PT LONG TERM GOAL #5   Title  Pt will decrease quick DASH score by at least 8% in order to demonstrate clinically significant reduction in disability.  Baseline  11/27/18: 45.45%; 12/23/18: 40.9%; 01/21/19: 29.5%, 6/4: 43%     Time  8    Period  Weeks    Status  On-going    Target Date  05/14/19      PT LONG TERM GOAL #6   Title  Pt will improve pain-free R shoulder strength for flexion and abduction so that it is symmetrical with L shoulder in order to return to full function at home    Baseline  12/23/18: R/L 4-/4+ Shoulder flexion, 4/4+ Shoulder abduction,  both painful on R side; 01/21/19: R/L 4/4+ Shoulder flexion, 4/4+ Shoulder abduction, both painful on R side; 03/19/19: Unable to test over telemedicine 6/4: 4-/4     Time  8    Period  Weeks    Status  On-going    Target Date  05/14/19            Plan - 04/21/19 0944    Clinical Impression Statement  Pt demonstrates good motivation during session. Slightly more guarding today however pain is no worse than before. She is able to complete all exercises as instructed. Weakness in prone horizontal abduction. Encouraged pt to continue withher HEP and therapist will continue to progress her strength/ROM.Pt will benefit from PT services to address deficits in strength,mobility, and painin order to return to full function at home.    Rehab Potential  Good    PT Frequency  2x / week    PT Duration  8 weeks    PT Treatment/Interventions  ADLs/Self Care Home Management;Aquatic Therapy;Canalith Repostioning;Cryotherapy;Electrical Stimulation;Iontophoresis '4mg'$ /ml Dexamethasone;Moist Heat;Traction;Ultrasound;Contrast Bath;DME Instruction;Gait training;Stair training;Functional mobility training;Therapeutic activities;Therapeutic exercise;Balance training;Neuromuscular re-education;Patient/family education;Manual techniques;Passive range of motion;Dry needling;Splinting;Taping;Vestibular    PT Next Visit Plan  Outcome measures and progress note, Review HEP and progress as needed, Progress AAROM/AROM/strengthening of shoulder as tolerated without pain    PT Home Exercise Plan  Cervical retractions, Scapular retractions, R upper trap stretch, supine canes for AAROM flexion with hands together progressing to AROM supine flexion, AROM sidelying shoulder abduction, AROM sidelying shoulder ER, seated pulleys for flexion and scaption ([id number])    Consulted and Agree with Plan of Care  Patient       Patient will benefit from skilled therapeutic intervention in order to improve the following deficits and  impairments:  Decreased strength, Decreased range of motion, Impaired UE functional use, Pain, Impaired vision/preception  Visit Diagnosis: Acute pain of right shoulder  Stiffness of right shoulder, not elsewhere classified  Muscle weakness (generalized)     Problem List Patient Active Problem List   Diagnosis Date Noted  . Chronic diarrhea 01/14/2019  . Malignant melanoma ([place]) 11/23/2016  . Urge incontinence 11/23/2016  . Atypical mole 09/22/2016  . Slurring of speech 05/12/2016  . Traumatic arthritis elbow 05/04/2015  . Left foot pain 12/31/2014  . Severe sprain of left ankle 12/31/2014  . Osteopenia 09/15/2014  . History of TIA (transient ischemic attack) 05/19/2014  . Fracture of coronoid process of ulna, left, closed 04/25/2014  . Ingrown toenail 01/15/2014  . Obesity (BMI 30-39.9) 10/08/2013  . OA (osteoarthritis) of knee 07/07/2013  . Chronic low back pain 05/09/2013  . Obstructive sleep apnea 05/09/2013  . GERD (gastroesophageal reflux disease) 05/09/2013  . Hiatal hernia 05/09/2013  . Fibromyalgia syndrome 05/09/2013  . Depression, major, in partial remission ([place]) 05/09/2013  . RLS (restless legs syndrome) 05/09/2013  . Diabetes mellitus with no complication ([place]) 35/68/6168  . High cholesterol 05/09/2013  . 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 Family history  of colon cancer 05/09/2013  . Allergic rhinitis 05/09/2013  . Lateral meniscal tear 09/04/2012  . CAD (coronary artery disease) 09/27/2011  . HTN (hypertension) 09/27/2011  . Paroxysmal A-fib ([place]) 09/27/2011     Phillips Grout PT, DPT, GCS  Huprich,Jason 04/21/2019, 10:31 AM  [place] MAIN [facility] SERVICES [address] [place], [place], [address] Phone: [phone]   Fax:  [phone]  Name: Alitza Cowman MRN: [id number] Date of Birth: 06-30-1941

## 2019-04-23 ENCOUNTER — Other Ambulatory Visit: Payer: Self-pay

## 2019-04-23 ENCOUNTER — Ambulatory Visit: Payer: Medicare Other

## 2019-04-23 DIAGNOSIS — M25511 Pain in right shoulder: Secondary | ICD-10-CM | POA: Diagnosis not present

## 2019-04-23 DIAGNOSIS — M25611 Stiffness of right shoulder, not elsewhere classified: Secondary | ICD-10-CM

## 2019-04-23 DIAGNOSIS — M6281 Muscle weakness (generalized): Secondary | ICD-10-CM

## 2019-04-23 NOTE — Therapy (Signed)
[place] MAIN [facility] SERVICES [address] [place], [place], [address] Phone: [phone]   Fax:  [phone]  Physical Therapy Treatment  Patient Details  Name: Morgan [name] MRN: [id number] Date of Birth: 1940/12/29 Referring Provider (PT): Dr. Mayer Camel   Encounter Date: 04/23/2019  PT End of Session - 04/23/19 1645    Visit Number  32    Number of Visits  65    Date for PT Re-Evaluation  05/14/19    Authorization Type  6/4 goals updated    PT Start Time  1635    PT Stop Time  1720    PT Time Calculation (min)  45 min    Activity Tolerance  Patient tolerated treatment well    Behavior During Therapy  [facility] for tasks assessed/performed       Past Medical History:  Diagnosis Date  . Angina   . Arthritis   . Atrial fibrillation (HCC)    ASPIRIN FOR BLOOD THINNER  . Atypical mole 09/22/2016  . Bulging discs    lumbar   . Cancer ([place])    melanoma  . Complication of anesthesia    pt states has choking sensation with ET tube   . Coronary artery disease   . Depression   . Diabetes mellitus    diet controlled/on meds  . Fibromyalgia   . GERD (gastroesophageal reflux disease)   . H/O hiatal hernia   . Headache(784.0)    "recurring"  . High cholesterol   . Hyperlipidemia   . Hypertension   . 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 Migraines    "til ~ 1980"  . PONV (postoperative nausea and vomiting)   . Restless leg syndrome   . Sciatic nerve pain    "from pinched nerve"  . Sleep apnea    uses CPAP  . Weakness of right side of body    "I've had PT for it; they don't know what it's from".  CORTISONE INJECTION INTO BACK 08/30/12    Past Surgical History:  Procedure Laterality Date  . [place] STUDY N/A 12/29/2015   Procedure: [place] STUDY;  Surgeon: Manus Gunning, MD;  Location: [facility];  Service: Gastroenterology;  Laterality: N/A;  . CARPAL TUNNEL RELEASE Left 07/02/2015   Procedure: CARPAL TUNNEL RELEASE;  Surgeon:  Frederik Pear, MD;  Location: [place];  Service: Orthopedics;  Laterality: Left;  . CATARACT EXTRACTION, BILATERAL Bilateral    Oct and Nov 2017  . CORONARY ANGIOPLASTY WITH STENT PLACEMENT  06/2011   "1"  . CORONARY ARTERY BYPASS GRAFT  2005   CABG X 2  . DILATION AND CURETTAGE OF UTERUS     "more than once"  . ELBOW ARTHROSCOPY Left 07/02/2015   Procedure: ARTHROSCOPY LEFT ELBOW WITH DEBRIDEMENT AND REMOVAL LOOSE BODY;  Surgeon: Frederik Pear, MD;  Location: [place];  Service: Orthopedics;  Laterality: Left;  . ESOPHAGEAL MANOMETRY N/A 12/29/2015   Procedure: ESOPHAGEAL MANOMETRY (EM);  Surgeon: Manus Gunning, MD;  Location: [facility];  Service: Gastroenterology;  Laterality: N/A;  . FRACTURE SURGERY  ~ 2005   nose  . KNEE ARTHROSCOPY  09/04/2012   Procedure: ARTHROSCOPY KNEE;  Surgeon: Gearlean Alf, MD;  Location: [facility];  Service: Orthopedics;  Laterality: Right;  right knee arthroscopy with medial and lateral meniscus debridement  . 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 MOUTH SURGERY  2004   "bone replacement; had cadavear  bones put in; face was collapsing"  . NASAL SEPTUM SURGERY  ~ 1986  . TOTAL KNEE ARTHROPLASTY Right 07/07/2013   Procedure: RIGHT TOTAL KNEE ARTHROPLASTY;  Surgeon: Gearlean Alf, MD;  Location: [facility];  Service: Orthopedics;  Laterality: Right;    There were no vitals filed for this visit.  Subjective Assessment - 04/23/19 1643    Subjective  Pt reports that she is doing alright today. She denies any R shoulder pain upon arrival today. No relief noted from steroid shot. No specific questions at this time.    Pertinent History  On 08/03/18 pt was carrying a folding table and fell and right arm was under the handle got stuck between the door and the table. She landed on her R side. Tried to get up and had to pull at her arm. Pt had immediate pain in her right shoulder. She waiteduntil Monday and  went to see Dr. Edilia Bo her PCP. He took radiographs but didn't find any fractures. She continued to have pain so she called Dr. Damita Dunnings office, her orthopedist, and saw him that same week. He took additional radiographs and found a hairline fracture in her humeral shaft and a greater tuberosity fracture per patient report. She states that she is not healing well and has recurrent plain film radiographs. She is still wearing a sling and was encouraged not to perform any AROM of her right shoulder. Since her last visit with Dr. Mayer Camel on 09/20/18 she has had an additional fall and landed on her R shoulder. She reports additional bruising and pain in her R shoulder. She has a follow-up appt scheduled with her orthopedist for 10/15/18. 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 ROS negative for red flags.    Limitations  Lifting    Diagnostic tests  Plain film radiographs with decreased healing per pt report    Patient Stated Goals  Improve ability to use RUE and decrease pain    Currently in Pain?  No/denies    Pain Onset  --           TREATMENT    Manual Therapy Gentle R shoulder oscillations to decrease guarding during PROM; R shoulder A/P grade I mobs at neutral 30s/bout x 3 bouts; R shoulder inferior grade I mobs at 90 abduction 30s/bout x 3 bouts; Attempted cross-body stretch x 20s but discontinued secondary to pain;   Ther-ex R shoulder AAROM with UE ranger for flexion, abduction, CW/CCW circles x 20 each; AROM flexion and abduction in mirror to decrease shrug, therapist assist for last 30 degrees of motion with mild pain at end range, x 10 each; Resisted rows with green tband 2 x 10; Resisted R shoulder extension with green tband 2 x 10; Resisted R shoulder ER with green tband 2 x 10; Wash cloth on wall horizontal abduction/adduction x 15; Ball rolls on wall CW/CCW x 15 each; Supine rhythmic stabs at 90 flexion with resistance at wrist x 30s; Supine  R shoulder flexion 3# dumbbell (DB) 2 x 10; Supine serratus punch with gentle therapist resistance x 10; Supine chest press with cane and manual resistance from therapist 2 x 10; L sidelying R shoulder abduction2# DB 2 x 10; L sidelying R shoulder ER2# DB 2 x 10;   Pt educated throughout session about proper posture and technique with exercises. Improved exercise technique, movement at target joints, use of target muscles after min to mod verbal cues;   Pt demonstrates good  motivation during session. She is able to complete all exercises as instructed without increase in pain. She demonstrates decreased R shoulder endurance with overhead activities. Encouraged pt to continue withher HEP and therapist will continue to progress her strength/ROM.Pt will benefit from PT services to address deficits in strength,mobility, and painin order to return to full function at home.                      PT Short Term Goals - 04/17/19 0904      PT SHORT TERM GOAL #1   Title  Pt will be independent with HEP in order to improve strength and decrease pain in order to improve pain-free function at home.    Time  4    Period  Weeks    Status  Achieved        PT Long Term Goals - 04/17/19 0904      PT LONG TERM GOAL #1   Title  Pt will decrease quick DASH score by at least 8% in order to demonstrate clinically significant reduction in disability.    Baseline  09/30/18: 84.1%; 10/30/18: 56.8%; 11/27/18: 45.45%; 12/23/18: 40.9%;     Time  8    Period  Weeks    Status  Achieved      PT LONG TERM GOAL #2   Title  Pt will decrease worst pain as reported on NPRS by at least 3 points in order to demonstrate clinically significant reduction in pain.    Baseline  09/30/18: worst: 9/10; 10/30/18: 6/10;    Time  8    Period  Weeks    Status  Achieved      PT LONG TERM GOAL #3   Title  Pt will improve PROM of R shoulder to within 10 degrees of R shoulder in order to improve  functional use of R shoulder for ADLs/IADLs.    Baseline  R/L 129/150 Shoulder flexion, 100/165 Shoulder abduction, 50/93 Shoulder external rotation, 50/70 Shoulder internal rotation; 10/30/18: R/L 140/150 Shoulder flexion, 132/165 Shoulder abduction, 82/93 Shoulder external rotation, 70/70 Shoulder internal rotation; 12/23/18:  R/L 155/150 Shoulder flexion, 134/165 Shoulder abduction, 89/93 Shoulder external rotation, 70/70 Shoulder internal rotation, 01/21/19: R/L 154/150 Shoulder flexion, 136/165 Shoulder abduction, 85/93 Shoulder external rotation, 70/70 Shoulder internal rotation; 03/19/19: Unable to test over telemedicine 6/4: R: 128 flexion, 121 abduction, 78 shoulder ER, 70 shoulder IR    Time  8    Period  Weeks    Status  Partially Met    Target Date  05/14/19      PT LONG TERM GOAL #4   Title  Pt will decrease worst pain as reported on NPRS by at least 3 points (3 or less) in order to demonstrate clinically significant reduction in pain.    Baseline  10/30/18: 6/10; 11/27/18: 4/10; 12/23/18: 5/10 (improved pain frequency); 01/21/19: 5/10 (pain is less frequent); 03/19/19: 4/10; 6/4: 5/10     Time  8    Period  Weeks    Status  Partially Met      PT LONG TERM GOAL #5   Title  Pt will decrease quick DASH score by at least 8% in order to demonstrate clinically significant reduction in disability.    Baseline  11/27/18: 45.45%; 12/23/18: 40.9%; 01/21/19: 29.5%, 6/4: 43%     Time  8    Period  Weeks    Status  On-going    Target Date  05/14/19      PT  LONG TERM GOAL #6   Title  Pt will improve pain-free R shoulder strength for flexion and abduction so that it is symmetrical with L shoulder in order to return to full function at home    Baseline  12/23/18: R/L 4-/4+ Shoulder flexion, 4/4+ Shoulder abduction, both painful on R side; 01/21/19: R/L 4/4+ Shoulder flexion, 4/4+ Shoulder abduction, both painful on R side; 03/19/19: Unable to test over telemedicine 6/4: 4-/4     Time  8    Period  Weeks     Status  On-going    Target Date  05/14/19            Plan - 04/23/19 1645    Clinical Impression Statement  Pt demonstrates good motivation during session. She is able to complete all exercises as instructed without increase in pain. She demonstrates decreased R shoulder endurance with overhead activities. Encouraged pt to continue with her HEP and therapist will continue to progress her strength/ROM. Pt will benefit from PT services to address deficits in strength, mobility, and pain in order to return to full function at home.    Rehab Potential  Good    PT Frequency  2x / week    PT Duration  8 weeks    PT Treatment/Interventions  ADLs/Self Care Home Management;Aquatic Therapy;Canalith Repostioning;Cryotherapy;Electrical Stimulation;Iontophoresis 54m/ml Dexamethasone;Moist Heat;Traction;Ultrasound;Contrast Bath;DME Instruction;Gait training;Stair training;Functional mobility training;Therapeutic activities;Therapeutic exercise;Balance training;Neuromuscular re-education;Patient/family education;Manual techniques;Passive range of motion;Dry needling;Splinting;Taping;Vestibular    PT Next Visit Plan  Review HEP and progress as needed, Progress AAROM/AROM/strengthening of shoulder as tolerated without pain    PT Home Exercise Plan  Cervical retractions, Scapular retractions, R upper trap stretch, supine canes for AAROM flexion with hands together progressing to AROM supine flexion, AROM sidelying shoulder abduction, AROM sidelying shoulder ER, seated pulleys for flexion and scaption ([id number])    Consulted and Agree with Plan of Care  Patient       Patient will benefit from skilled therapeutic intervention in order to improve the following deficits and impairments:  Decreased strength, Decreased range of motion, Impaired UE functional use, Pain, Impaired vision/preception  Visit Diagnosis: Acute pain of right shoulder  Stiffness of right shoulder, not elsewhere classified  Muscle  weakness (generalized)     Problem List Patient Active Problem List   Diagnosis Date Noted  . Chronic diarrhea 01/14/2019  . Malignant melanoma ([place] 11/23/2016  . Urge incontinence 11/23/2016  . Atypical mole 09/22/2016  . Slurring of speech 05/12/2016  . Traumatic arthritis elbow 05/04/2015  . Left foot pain 12/31/2014  . Severe sprain of left ankle 12/31/2014  . Osteopenia 09/15/2014  . History of TIA (transient ischemic attack) 05/19/2014  . Fracture of coronoid process of ulna, left, closed 04/25/2014  . Ingrown toenail 01/15/2014  . Obesity (BMI 30-39.9) 10/08/2013  . OA (osteoarthritis) of knee 07/07/2013  . Chronic low back pain 05/09/2013  . Obstructive sleep apnea 05/09/2013  . GERD (gastroesophageal reflux disease) 05/09/2013  . Hiatal hernia 05/09/2013  . Fibromyalgia syndrome 05/09/2013  . Depression, major, in partial remission ([place] 05/09/2013  . RLS (restless legs syndrome) 05/09/2013  . Diabetes mellitus with no complication ([place] 041/28/7867 . High cholesterol 05/09/2013  . 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 Family history of colon cancer 05/09/2013  . Allergic rhinitis 05/09/2013  . Lateral meniscal tear 09/04/2012  . CAD (coronary artery disease) 09/27/2011  . HTN (hypertension) 09/27/2011  . Paroxysmal A-fib ([place] 09/27/2011   JLyndel Safe PT, DPT, GCS  , 04/24/2019, 11:27 AM  [place] MAIN [facility] SERVICES [address] [place], [place], [address] Phone: [phone]   Fax:  [phone]  Name: Gregg Holster MRN: [id number] Date of Birth: 1940/12/20

## 2019-04-29 ENCOUNTER — Ambulatory Visit: Payer: Medicare Other

## 2019-04-29 ENCOUNTER — Other Ambulatory Visit: Payer: Self-pay

## 2019-04-29 DIAGNOSIS — M6281 Muscle weakness (generalized): Secondary | ICD-10-CM

## 2019-04-29 DIAGNOSIS — M25511 Pain in right shoulder: Secondary | ICD-10-CM | POA: Diagnosis not present

## 2019-04-29 DIAGNOSIS — M25611 Stiffness of right shoulder, not elsewhere classified: Secondary | ICD-10-CM

## 2019-04-29 NOTE — Therapy (Signed)
[place] MAIN [facility] SERVICES [address] [place], [place], [address] Phone: [phone]   Fax:  [phone]  Physical Therapy Treatment  Patient Details  Name: Morgan [name] MRN: [id number] Date of Birth: 1941-05-17 Referring Provider (PT): Dr. Mayer Camel   Encounter Date: 04/29/2019  PT End of Session - 04/29/19 1436    Visit Number  33    Number of Visits  65    Date for PT Re-Evaluation  05/14/19    Authorization Type  6/4 goals updated    PT Start Time  1433    PT Stop Time  1517    PT Time Calculation (min)  44 min    Activity Tolerance  Patient tolerated treatment well    Behavior During Therapy  [facility] for tasks assessed/performed       Past Medical History:  Diagnosis Date  . Angina   . Arthritis   . Atrial fibrillation (HCC)    ASPIRIN FOR BLOOD THINNER  . Atypical mole 09/22/2016  . Bulging discs    lumbar   . Cancer ([place])    melanoma  . Complication of anesthesia    pt states has choking sensation with ET tube   . Coronary artery disease   . Depression   . Diabetes mellitus    diet controlled/on meds  . Fibromyalgia   . GERD (gastroesophageal reflux disease)   . H/O hiatal hernia   . Headache(784.0)    "recurring"  . High cholesterol   . Hyperlipidemia   . Hypertension   . 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 Migraines    "til ~ 1980"  . PONV (postoperative nausea and vomiting)   . Restless leg syndrome   . Sciatic nerve pain    "from pinched nerve"  . Sleep apnea    uses CPAP  . Weakness of right side of body    "I've had PT for it; they don't know what it's from".  CORTISONE INJECTION INTO BACK 08/30/12    Past Surgical History:  Procedure Laterality Date  . [place] STUDY N/A 12/29/2015   Procedure: [place] STUDY;  Surgeon: Manus Gunning, MD;  [facility];  Service: Gastroenterology;  Laterality: N/A;  . CARPAL TUNNEL RELEASE Left 07/02/2015   Procedure: CARPAL TUNNEL RELEASE;  Surgeon:  Frederik Pear, MD;  Location: [place];  Service: Orthopedics;  Laterality: Left;  . CATARACT EXTRACTION, BILATERAL Bilateral    Oct and Nov 2017  . CORONARY ANGIOPLASTY WITH STENT PLACEMENT  06/2011   "1"  . CORONARY ARTERY BYPASS GRAFT  2005   CABG X 2  . DILATION AND CURETTAGE OF UTERUS     "more than once"  . ELBOW ARTHROSCOPY Left 07/02/2015   Procedure: ARTHROSCOPY LEFT ELBOW WITH DEBRIDEMENT AND REMOVAL LOOSE BODY;  Surgeon: Frederik Pear, MD;  Location: [place];  Service: Orthopedics;  Laterality: Left;  . ESOPHAGEAL MANOMETRY N/A 12/29/2015   Procedure: ESOPHAGEAL MANOMETRY (EM);  Surgeon: Manus Gunning, MD;  [facility];  Service: Gastroenterology;  Laterality: N/A;  . FRACTURE SURGERY  ~ 2005   nose  . KNEE ARTHROSCOPY  09/04/2012   Procedure: ARTHROSCOPY KNEE;  Surgeon: Gearlean Alf, MD;  [facility];  Service: Orthopedics;  Laterality: Right;  right knee arthroscopy with medial and lateral meniscus debridement  . 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 MOUTH SURGERY  2004   "bone replacement; had cadavear  bones put in; face was collapsing"  . NASAL SEPTUM SURGERY  ~ 1986  . TOTAL KNEE ARTHROPLASTY Right 07/07/2013   Procedure: RIGHT TOTAL KNEE ARTHROPLASTY;  Surgeon: Gearlean Alf, MD;  [facility];  Service: Orthopedics;  Laterality: Right;    There were no vitals filed for this visit.  Subjective Assessment - 04/29/19 1434    Subjective  Pt reports that she is doing alright today. She denies any R shoulder pain upon arrival today but reports some soreness in her shoulder from a fall that occured while hanging mirrors in her bathroom with her husband. She reports some occasional electric shock sensations in her R arm which last 3-4 seconds and occur very infrequently. She reports possibly a small relief from the steroid injection. No specific questions at this time.    Pertinent History  On  08/03/18 pt was carrying a folding table and fell and right arm was under the handle got stuck between the door and the table. She landed on her R side. Tried to get up and had to pull at her arm. Pt had immediate pain in her right shoulder. She waited until Monday and went to see Dr. Edilia Bo her PCP. He took radiographs but didn't find any fractures. She continued to have pain so she called Dr. Damita Dunnings office, her orthopedist, and saw him that same week. He took additional radiographs and found a hairline fracture in her humeral shaft and a greater tuberosity fracture per patient report. She states that she is not healing well and has recurrent plain film radiographs. She is still wearing a sling and was encouraged not to perform any AROM of her right shoulder. Since her last visit with Dr. Mayer Camel on 09/20/18 she has had an additional fall and landed on her R shoulder. She reports additional bruising and pain in her R shoulder. She has a follow-up appt scheduled with her orthopedist for 10/15/18. 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 ROS negative for red flags.    Limitations  Lifting    Diagnostic tests  Plain film radiographs with decreased healing per pt report    Patient Stated Goals  Improve ability to use RUE and decrease pain    Currently in Pain?  No/denies         TREATMENT   Ther-ex UBE 69mn forward/274m backwards for warm-up during history (2 minutes unbilled); R shoulder AAROM with UE ranger for flexion, abduction, CW/CCW circles x 15 each; AROM flexion and abduction in mirror to decrease shrug, therapist assist for last 30 degrees of motion with mild pain at end range, x 10 each; Wash cloth on wall horizontal abduction/adduction x 20 each; Ball rolls on wall CW/CCW x 15 each; Supine R shoulder flexion 3# dumbbell (DB) 2 x 10; Supine serratus punch with gentle therapist resistance 2 x 10; Supine R shoulder circles with 3# DB CW/CCW 2 x 10 each  direction; L sidelying R shoulder abduction2# DB2x 10; L sidelying R shoulder ER2# DB2x 10;   Trigger Point Dry Needling (TDN), unbilled Education performed with patient regarding potential benefit of TDN. Reviewed precautions and risks with patient. Reviewed special precautions/risks over lung fields which include pneumothorax. Reviewed signs and symptoms of pneumothorax and advised pt to go to ER immediately if these symptoms develop advise them of dry needling treatment. Extensive time spent with pt to ensure full understanding of TDN risks. Pt provided verbal consent to treatment. TDN performed to R supraspinatus  with 3, 0.3 x 50 single needle placements with local twitch response (LTR). One additional placement in R upper trap with LTR. Pistoning technique utilized.    Pt educated throughout session about proper posture and technique with exercises. Improved exercise technique, movement at target joints, use of target muscles after min to mod verbal cues;   Pt demonstrates good motivation during session. She is able to complete all exercises as instructed without increase in pain. Pt reports "knots" in her R shoulder and is very tender to palpation over trigger points. Performed trigger point dry needling with patient today to help with pain and R shoulder mobility. Encouraged pt to continue withher HEP and therapist will continue to progress her strength/ROM.Pt will benefit from PT services to address deficits in strength,mobility, and painin order to return to full function at home.                        PT Short Term Goals - 04/17/19 0904      PT SHORT TERM GOAL #1   Title  Pt will be independent with HEP in order to improve strength and decrease pain in order to improve pain-free function at home.    Time  4    Period  Weeks    Status  Achieved        PT Long Term Goals - 04/17/19 0904      PT LONG TERM GOAL #1   Title  Pt will decrease quick  DASH score by at least 8% in order to demonstrate clinically significant reduction in disability.    Baseline  09/30/18: 84.1%; 10/30/18: 56.8%; 11/27/18: 45.45%; 12/23/18: 40.9%;     Time  8    Period  Weeks    Status  Achieved      PT LONG TERM GOAL #2   Title  Pt will decrease worst pain as reported on NPRS by at least 3 points in order to demonstrate clinically significant reduction in pain.    Baseline  09/30/18: worst: 9/10; 10/30/18: 6/10;    Time  8    Period  Weeks    Status  Achieved      PT LONG TERM GOAL #3   Title  Pt will improve PROM of R shoulder to within 10 degrees of R shoulder in order to improve functional use of R shoulder for ADLs/IADLs.    Baseline  R/L 129/150 Shoulder flexion, 100/165 Shoulder abduction, 50/93 Shoulder external rotation, 50/70 Shoulder internal rotation; 10/30/18: R/L 140/150 Shoulder flexion, 132/165 Shoulder abduction, 82/93 Shoulder external rotation, 70/70 Shoulder internal rotation; 12/23/18:  R/L 155/150 Shoulder flexion, 134/165 Shoulder abduction, 89/93 Shoulder external rotation, 70/70 Shoulder internal rotation, 01/21/19: R/L 154/150 Shoulder flexion, 136/165 Shoulder abduction, 85/93 Shoulder external rotation, 70/70 Shoulder internal rotation; 03/19/19: Unable to test over telemedicine 6/4: R: 128 flexion, 121 abduction, 78 shoulder ER, 70 shoulder IR    Time  8    Period  Weeks    Status  Partially Met    Target Date  05/14/19      PT LONG TERM GOAL #4   Title  Pt will decrease worst pain as reported on NPRS by at least 3 points (3 or less) in order to demonstrate clinically significant reduction in pain.    Baseline  10/30/18: 6/10; 11/27/18: 4/10; 12/23/18: 5/10 (improved pain frequency); 01/21/19: 5/10 (pain is less frequent); 03/19/19: 4/10; 6/4: 5/10     Time  8    Period  Weeks    Status  Partially Met      PT LONG TERM GOAL #5   Title  Pt will decrease quick DASH score by at least 8% in order to demonstrate clinically significant  reduction in disability.    Baseline  11/27/18: 45.45%; 12/23/18: 40.9%; 01/21/19: 29.5%, 6/4: 43%     Time  8    Period  Weeks    Status  On-going    Target Date  05/14/19      PT LONG TERM GOAL #6   Title  Pt will improve pain-free R shoulder strength for flexion and abduction so that it is symmetrical with L shoulder in order to return to full function at home    Baseline  12/23/18: R/L 4-/4+ Shoulder flexion, 4/4+ Shoulder abduction, both painful on R side; 01/21/19: R/L 4/4+ Shoulder flexion, 4/4+ Shoulder abduction, both painful on R side; 03/19/19: Unable to test over telemedicine 6/4: 4-/4     Time  8    Period  Weeks    Status  On-going    Target Date  05/14/19            Plan - 04/29/19 1437    Clinical Impression Statement  Pt demonstrates good motivation during session. She is able to complete all exercises as instructed without increase in pain. Pt reports "knots" in her R shoulder and is very tender to palpation over trigger points. Performed trigger point dry needling with patient today to help with pain and R shoulder mobility. Encouraged pt to continue with her HEP and therapist will continue to progress her strength/ROM. Pt will benefit from PT services to address deficits in strength, mobility, and pain in order to return to full function at home.    Rehab Potential  Good    PT Frequency  2x / week    PT Duration  8 weeks    PT Treatment/Interventions  ADLs/Self Care Home Management;Aquatic Therapy;Canalith Repostioning;Cryotherapy;Electrical Stimulation;Iontophoresis '4mg'$ /ml Dexamethasone;Moist Heat;Traction;Ultrasound;Contrast Bath;DME Instruction;Gait training;Stair training;Functional mobility training;Therapeutic activities;Therapeutic exercise;Balance training;Neuromuscular re-education;Patient/family education;Manual techniques;Passive range of motion;Dry needling;Splinting;Taping;Vestibular    PT Next Visit Plan  Progress AAROM/AROM/strengthening of R shoulder as  tolerated without pain    PT Home Exercise Plan  Cervical retractions, Scapular retractions, R upper trap stretch, supine canes for AAROM flexion with hands together progressing to AROM supine flexion, AROM sidelying shoulder abduction, AROM sidelying shoulder ER, seated pulleys for flexion and scaption ([id number])    Consulted and Agree with Plan of Care  Patient       Patient will benefit from skilled therapeutic intervention in order to improve the following deficits and impairments:  Decreased strength, Decreased range of motion, Impaired UE functional use, Pain, Impaired vision/preception  Visit Diagnosis: 1. Acute pain of right shoulder   2. Stiffness of right shoulder, not elsewhere classified   3. Muscle weakness (generalized)        Problem List Patient Active Problem List   Diagnosis Date Noted  . Chronic diarrhea 01/14/2019  . Malignant melanoma ([place]) 11/23/2016  . Urge incontinence 11/23/2016  . Atypical mole 09/22/2016  . Slurring of speech 05/12/2016  . Traumatic arthritis elbow 05/04/2015  . Left foot pain 12/31/2014  . Severe sprain of left ankle 12/31/2014  . Osteopenia 09/15/2014  . History of TIA (transient ischemic attack) 05/19/2014  . Fracture of coronoid process of ulna, left, closed 04/25/2014  . Ingrown toenail 01/15/2014  . Obesity (BMI 30-39.9) 10/08/2013  . OA (osteoarthritis) of knee 07/07/2013  . Chronic  low back pain 05/09/2013  . Obstructive sleep apnea 05/09/2013  . GERD (gastroesophageal reflux disease) 05/09/2013  . Hiatal hernia 05/09/2013  . Fibromyalgia syndrome 05/09/2013  . Depression, major, in partial remission ([place]) 05/09/2013  . RLS (restless legs syndrome) 05/09/2013  . Diabetes mellitus with no complication ([place]) 52/71/2929  . High cholesterol 05/09/2013  . 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 Family history of colon cancer 05/09/2013  . Allergic rhinitis 05/09/2013  . Lateral meniscal tear 09/04/2012  . CAD (coronary artery  disease) 09/27/2011  . HTN (hypertension) 09/27/2011  . Paroxysmal A-fib ([place]) 09/27/2011   Phillips Grout PT, DPT, GCS  Morgan [name] 04/30/2019, 1:36 PM  [place] MAIN [facility] SERVICES [address] [place], [place], [address] Phone: [phone]   Fax:  [phone]  Name: Morgan [name] MRN: [id number] Date of Birth: 1941/07/29

## 2019-04-29 NOTE — Patient Instructions (Addendum)

## 2019-05-01 ENCOUNTER — Other Ambulatory Visit: Payer: Self-pay

## 2019-05-01 ENCOUNTER — Ambulatory Visit: Payer: Medicare Other

## 2019-05-01 DIAGNOSIS — M25511 Pain in right shoulder: Secondary | ICD-10-CM

## 2019-05-01 DIAGNOSIS — M25611 Stiffness of right shoulder, not elsewhere classified: Secondary | ICD-10-CM

## 2019-05-01 DIAGNOSIS — M6281 Muscle weakness (generalized): Secondary | ICD-10-CM

## 2019-05-01 NOTE — Therapy (Signed)
[place] MAIN [facility] SERVICES [address] [place], [place], [address] Phone: [phone]   Fax:  [phone]  Physical Therapy Treatment  Patient Details  Name: Rily Nickey MRN: [id number] Date of Birth: [birth year] Referring Provider (PT): Dr. Mayer Camel   Encounter Date: 05/01/2019    Past Medical History:  Diagnosis Date  . Angina   . Arthritis   . Atrial fibrillation (HCC)    ASPIRIN FOR BLOOD THINNER  . Atypical mole 09/22/2016  . Bulging discs    lumbar   . Cancer ([place])    melanoma  . Complication of anesthesia    pt states has choking sensation with ET tube   . Coronary artery disease   . Depression   . Diabetes mellitus    diet controlled/on meds  . Fibromyalgia   . GERD (gastroesophageal reflux disease)   . H/O hiatal hernia   . Headache(784.0)    "recurring"  . High cholesterol   . Hyperlipidemia   . Hypertension   . 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 Migraines    "til ~ 1980"  . PONV (postoperative nausea and vomiting)   . Restless leg syndrome   . Sciatic nerve pain    "from pinched nerve"  . Sleep apnea    uses CPAP  . Weakness of right side of body    "I've had PT for it; they don't know what it's from".  CORTISONE INJECTION INTO BACK 08/30/12    Past Surgical History:  Procedure Laterality Date  . [place] STUDY N/A 12/29/2015   Procedure: [place] STUDY;  Surgeon: Manus Gunning, MD;  [facility];  Service: Gastroenterology;  Laterality: N/A;  . CARPAL TUNNEL RELEASE Left 07/02/2015   Procedure: CARPAL TUNNEL RELEASE;  Surgeon: Frederik Pear, MD;  Location: [place];  Service: Orthopedics;  Laterality: Left;  . CATARACT EXTRACTION, BILATERAL Bilateral    Oct and Nov 2017  . CORONARY ANGIOPLASTY WITH STENT PLACEMENT  06/2011   "1"  . CORONARY ARTERY BYPASS GRAFT  2005   CABG X 2  . DILATION AND CURETTAGE OF UTERUS     "more than once"  . ELBOW ARTHROSCOPY Left 07/02/2015   Procedure: ARTHROSCOPY LEFT ELBOW WITH DEBRIDEMENT AND REMOVAL LOOSE BODY;  Surgeon: Frederik Pear, MD;  Location: [place];  Service: Orthopedics;  Laterality: Left;  . ESOPHAGEAL MANOMETRY N/A 12/29/2015   Procedure: ESOPHAGEAL MANOMETRY (EM);  Surgeon: Manus Gunning, MD;  [facility];  Service: Gastroenterology;  Laterality: N/A;  . FRACTURE SURGERY  ~ 2005   nose  . KNEE ARTHROSCOPY  09/04/2012   Procedure: ARTHROSCOPY KNEE;  Surgeon: Gearlean Alf, MD;  [facility];  Service: Orthopedics;  Laterality: Right;  right knee arthroscopy with medial and lateral meniscus debridement  . 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 MOUTH SURGERY  2004   "bone replacement; had cadavear bones put in; face was collapsing"  . NASAL SEPTUM SURGERY  ~ 1986  . TOTAL KNEE ARTHROPLASTY Right 07/07/2013   Procedure: RIGHT TOTAL KNEE ARTHROPLASTY;  Surgeon: Gearlean Alf, MD;  [facility];  Service: Orthopedics;  Laterality: Right;    There were no vitals filed for this visit.    TREATMENT     Ther-ex  UBE 19mn forward/286m backwards for warm-up during history (2 minutes unbilled); R shoulder AAROM with UE ranger for flexion, abduction, CW/CCW circles x 15 each; AROM flexion  and abduction in mirror to decrease shrug, tactile cues for shoulder positioning, proper technique  Wash cloth on wall horizontal abduction/adduction x 20 each; Ball rolls on wall CW/CCW x 15 each; Bicep curl GTB x165 Scapular retraction GTB x15 Shoulder extension GTB x15 R UT stretch 2x30secs, tactile and visual cues    Manual Therapy  STM to R UT, infraspinatus, supraspinatus, teres minor x73mns, pt reported decreased pain/tension after.   Pt educated throughout session about proper posture and technique with exercises. Improved exercise technique, movement at target joints, use of target muscles after min to mod verbal cues;    PT Short Term Goals - 04/17/19  0904      PT SHORT TERM GOAL #1   Title  Pt will be independent with HEP in order to improve strength and decrease pain in order to improve pain-free function at home.    Time  4    Period  Weeks    Status  Achieved        PT Long Term Goals - 04/17/19 0904      PT LONG TERM GOAL #1   Title  Pt will decrease quick DASH score by at least 8% in order to demonstrate clinically significant reduction in disability.    Baseline  09/30/18: 84.1%; 10/30/18: 56.8%; 11/27/18: 45.45%; 12/23/18: 40.9%;     Time  8    Period  Weeks    Status  Achieved      PT LONG TERM GOAL #2   Title  Pt will decrease worst pain as reported on NPRS by at least 3 points in order to demonstrate clinically significant reduction in pain.    Baseline  09/30/18: worst: 9/10; 10/30/18: 6/10;    Time  8    Period  Weeks    Status  Achieved      PT LONG TERM GOAL #3   Title  Pt will improve PROM of R shoulder to within 10 degrees of R shoulder in order to improve functional use of R shoulder for ADLs/IADLs.    Baseline  R/L 129/150 Shoulder flexion, 100/165 Shoulder abduction, 50/93 Shoulder external rotation, 50/70 Shoulder internal rotation; 10/30/18: R/L 140/150 Shoulder flexion, 132/165 Shoulder abduction, 82/93 Shoulder external rotation, 70/70 Shoulder internal rotation; 12/23/18:  R/L 155/150 Shoulder flexion, 134/165 Shoulder abduction, 89/93 Shoulder external rotation, 70/70 Shoulder internal rotation, 01/21/19: R/L 154/150 Shoulder flexion, 136/165 Shoulder abduction, 85/93 Shoulder external rotation, 70/70 Shoulder internal rotation; 03/19/19: Unable to test over telemedicine 6/4: R: 128 flexion, 121 abduction, 78 shoulder ER, 70 shoulder IR    Time  8    Period  Weeks    Status  Partially Met    Target Date  05/14/19      PT LONG TERM GOAL #4   Title  Pt will decrease worst pain as reported on NPRS by at least 3 points (3 or less) in order to demonstrate clinically significant reduction in pain.    Baseline   10/30/18: 6/10; 11/27/18: 4/10; 12/23/18: 5/10 (improved pain frequency); 01/21/19: 5/10 (pain is less frequent); 03/19/19: 4/10; 6/4: 5/10     Time  8    Period  Weeks    Status  Partially Met      PT LONG TERM GOAL #5   Title  Pt will decrease quick DASH score by at least 8% in order to demonstrate clinically significant reduction in disability.    Baseline  11/27/18: 45.45%; 12/23/18: 40.9%; 01/21/19: 29.5%, 6/4: 43%     Time  8    Period  Weeks    Status  On-going    Target Date  05/14/19      PT LONG TERM GOAL #6   Title  Pt will improve pain-free R shoulder strength for flexion and abduction so that it is symmetrical with L shoulder in order to return to full function at home    Baseline  12/23/18: R/L 4-/4+ Shoulder flexion, 4/4+ Shoulder abduction, both painful on R side; 01/21/19: R/L 4/4+ Shoulder flexion, 4/4+ Shoulder abduction, both painful on R side; 03/19/19: Unable to test over telemedicine 6/4: 4-/4     Time  8    Period  Weeks    Status  On-going    Target Date  05/14/19            Plan - 05/03/19 0758    Clinical Impression Statement  Patient motivated to regain as much function as possible in her R shoulder. Pt reported decreased pain/tension after manual therapy, tissue tension noted by PT. Overall the patient would benefit from further skilled PT intervention to maximize functional abilities.    Rehab Potential  Good    PT Frequency  2x / week    PT Duration  8 weeks    PT Treatment/Interventions  ADLs/Self Care Home Management;Aquatic Therapy;Canalith Repostioning;Cryotherapy;Electrical Stimulation;Iontophoresis '4mg'$ /ml Dexamethasone;Moist Heat;Traction;Ultrasound;Contrast Bath;DME Instruction;Gait training;Stair training;Functional mobility training;Therapeutic activities;Therapeutic exercise;Balance training;Neuromuscular re-education;Patient/family education;Manual techniques;Passive range of motion;Dry needling;Splinting;Taping;Vestibular    PT Next Visit Plan   Progress AAROM/AROM/strengthening of R shoulder as tolerated without pain    PT Home Exercise Plan  Cervical retractions, Scapular retractions, R upper trap stretch, supine canes for AAROM flexion with hands together progressing to AROM supine flexion, AROM sidelying shoulder abduction, AROM sidelying shoulder ER, seated pulleys for flexion and scaption ([id number])    Consulted and Agree with Plan of Care  Patient       Patient will benefit from skilled therapeutic intervention in order to improve the following deficits and impairments:  Decreased strength, Decreased range of motion, Impaired UE functional use, Pain, Impaired vision/preception  Visit Diagnosis: 1. Acute pain of right shoulder   2. Stiffness of right shoulder, not elsewhere classified   3. Muscle weakness (generalized)        Problem List Patient Active Problem List   Diagnosis Date Noted  . Chronic diarrhea 01/14/2019  . Malignant melanoma ([place]) 11/23/2016  . Urge incontinence 11/23/2016  . Atypical mole 09/22/2016  . Slurring of speech 05/12/2016  . Traumatic arthritis elbow 05/04/2015  . Left foot pain 12/31/2014  . Severe sprain of left ankle 12/31/2014  . Osteopenia 09/15/2014  . History of TIA (transient ischemic attack) 05/19/2014  . Fracture of coronoid process of ulna, left, closed 04/25/2014  . Ingrown toenail 01/15/2014  . Obesity (BMI 30-39.9) 10/08/2013  . OA (osteoarthritis) of knee 07/07/2013  . Chronic low back pain 05/09/2013  . Obstructive sleep apnea 05/09/2013  . GERD (gastroesophageal reflux disease) 05/09/2013  . Hiatal hernia 05/09/2013  . Fibromyalgia syndrome 05/09/2013  . Depression, major, in partial remission ([place]) 05/09/2013  . RLS (restless legs syndrome) 05/09/2013  . Diabetes mellitus with no complication ([place]) 94/76/5465  . High cholesterol 05/09/2013  . 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 Family history of colon cancer 05/09/2013  . Allergic rhinitis 05/09/2013  . Lateral  meniscal tear 09/04/2012  . CAD (coronary artery disease) 09/27/2011  . HTN (hypertension) 09/27/2011  . Paroxysmal A-fib ([place]) 09/27/2011    Lieutenant Diego PT, DPT [phone] AM,05/03/19 [phone]  [place] MAIN [facility] SERVICES [address] [place], [place], [address] Phone: [phone]   Fax:  [phone]  Name: Kendrea Cerritos MRN: [id number] Date of Birth: [birth year]

## 2019-05-05 ENCOUNTER — Ambulatory Visit: Payer: Medicare Other

## 2019-05-05 ENCOUNTER — Other Ambulatory Visit: Payer: Self-pay

## 2019-05-05 DIAGNOSIS — M25511 Pain in right shoulder: Secondary | ICD-10-CM | POA: Diagnosis not present

## 2019-05-05 DIAGNOSIS — M25611 Stiffness of right shoulder, not elsewhere classified: Secondary | ICD-10-CM

## 2019-05-05 DIAGNOSIS — M6281 Muscle weakness (generalized): Secondary | ICD-10-CM

## 2019-05-05 NOTE — Therapy (Signed)
[place] MAIN [facility] SERVICES [address] [place], [place], [address] Phone: [phone]   Fax:  [phone]  Physical Therapy Treatment  Patient Details  Name: Morgan [name] MRN: [id number] Date of Birth: 07-Mar-1941 Referring Provider (PT): Dr. Mayer Camel   Encounter Date: 05/05/2019  PT End of Session - 05/05/19 0841    Visit Number  35    Number of Visits  65    Date for PT Re-Evaluation  05/14/19    Authorization Type  6/4 goals updated    PT Start Time  0837    PT Stop Time  0920    PT Time Calculation (min)  43 min    Activity Tolerance  Patient tolerated treatment well    Behavior During Therapy  [facility] for tasks assessed/performed       Past Medical History:  Diagnosis Date  . Angina   . Arthritis   . Atrial fibrillation (HCC)    ASPIRIN FOR BLOOD THINNER  . Atypical mole 09/22/2016  . Bulging discs    lumbar   . Cancer ([place])    melanoma  . Complication of anesthesia    pt states has choking sensation with ET tube   . Coronary artery disease   . Depression   . Diabetes mellitus    diet controlled/on meds  . Fibromyalgia   . GERD (gastroesophageal reflux disease)   . H/O hiatal hernia   . Headache(784.0)    "recurring"  . High cholesterol   . Hyperlipidemia   . Hypertension   . 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 Migraines    "til ~ 1980"  . PONV (postoperative nausea and vomiting)   . Restless leg syndrome   . Sciatic nerve pain    "from pinched nerve"  . Sleep apnea    uses CPAP  . Weakness of right side of body    "I've had PT for it; they don't know what it's from".  CORTISONE INJECTION INTO BACK 08/30/12    Past Surgical History:  Procedure Laterality Date  . [place] STUDY N/A 12/29/2015   Procedure: [place] STUDY;  Surgeon: Manus Gunning, MD;  Location: [facility];  Service: Gastroenterology;  Laterality: N/A;  . CARPAL TUNNEL RELEASE Left 07/02/2015   Procedure: CARPAL TUNNEL RELEASE;  Surgeon:  Frederik Pear, MD;  Location: [place];  Service: Orthopedics;  Laterality: Left;  . CATARACT EXTRACTION, BILATERAL Bilateral    Oct and Nov 2017  . CORONARY ANGIOPLASTY WITH STENT PLACEMENT  06/2011   "1"  . CORONARY ARTERY BYPASS GRAFT  2005   CABG X 2  . DILATION AND CURETTAGE OF UTERUS     "more than once"  . ELBOW ARTHROSCOPY Left 07/02/2015   Procedure: ARTHROSCOPY LEFT ELBOW WITH DEBRIDEMENT AND REMOVAL LOOSE BODY;  Surgeon: Frederik Pear, MD;  Location: [place];  Service: Orthopedics;  Laterality: Left;  . ESOPHAGEAL MANOMETRY N/A 12/29/2015   Procedure: ESOPHAGEAL MANOMETRY (EM);  Surgeon: Manus Gunning, MD;  Location: [facility];  Service: Gastroenterology;  Laterality: N/A;  . FRACTURE SURGERY  ~ 2005   nose  . KNEE ARTHROSCOPY  09/04/2012   Procedure: ARTHROSCOPY KNEE;  Surgeon: Gearlean Alf, MD;  Location: [facility];  Service: Orthopedics;  Laterality: Right;  right knee arthroscopy with medial and lateral meniscus debridement  . 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 MOUTH SURGERY  2004   "bone replacement; had cadavear  bones put in; face was collapsing"  . NASAL SEPTUM SURGERY  ~ 1986  . TOTAL KNEE ARTHROPLASTY Right 07/07/2013   Procedure: RIGHT TOTAL KNEE ARTHROPLASTY;  Surgeon: Gearlean Alf, MD;  Location: [facility];  Service: Orthopedics;  Laterality: Right;    There were no vitals filed for this visit.  Subjective Assessment - 05/05/19 0840    Subjective  Pt states that she did have some soreness for about 2 days after dry needling last week. She denies any R shoulder pain at rest upon arrival today. She saw her orthopedist last Thursday who told her that she has bone spurs in her shoulder and the next option would be to consider surgery. This has some obvious risks given her age, comorbid conditions, and current COVID pandemic. Pt and orthopedist agreed not to consider this option until the fall at the  [place]liers. No specific questions or concerns currently.    Pertinent History  On 08/03/18 pt was carrying a folding table and fell and right arm was under the handle got stuck between the door and the table. She landed on her R side. Tried to get up and had to pull at her arm. Pt had immediate pain in her right shoulder. She waited until Monday and went to see Dr. Edilia Bo her PCP. He took radiographs but didn't find any fractures. She continued to have pain so she called Dr. Damita Dunnings office, her orthopedist, and saw him that same week. He took additional radiographs and found a hairline fracture in her humeral shaft and a greater tuberosity fracture per patient report. She states that she is not healing well and has recurrent plain film radiographs. She is still wearing a sling and was encouraged not to perform any AROM of her right shoulder. Since her last visit with Dr. Mayer Camel on 09/20/18 she has had an additional fall and landed on her R shoulder. She reports additional bruising and pain in her R shoulder. She has a follow-up appt scheduled with her orthopedist for 10/15/18. 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 ROS negative for red flags.    Limitations  Lifting    Diagnostic tests  Plain film radiographs with decreased healing per pt report    Patient Stated Goals  Improve ability to use RUE and decrease pain    Currently in Pain?  No/denies        TREATMENT    Ther-ex UBE 81mn forward/250m backwards for warm-up during history (2 minutes unbilled); Wash cloth on wall for flexion and horizontal abduction/adduction x 20 each; Supine R shoulder flexion3# dumbbell (DB)x 15; Supine R shoulder circles with 3# DB CW/CCW x 15 each direction; Supine serratus punch with gentle therapist resistance x 15; Supine R shoulder rhythmic stabilizations x 30s L sidelying R shoulder abduction2# DBx 15 L sidelying R shoulder ER2# DBx 15 L sidelying R shoulder horizontal  abduction    Trigger Point Dry Needling (TDN), unbilled Education performed with patient regarding potential benefit of TDN. Reviewed precautions and risks with patient. Reviewed special precautions/risks over lung fields which include pneumothorax. Reviewed signs and symptoms of pneumothorax and advised pt to go to ER immediately if these symptoms develop advise them of dry needling treatment. Extensive time spent with pt to ensure full understanding of TDN risks. Pt provided verbal consent to treatment. TDN performed to R supraspinatus with 2, 0.3 x 50 single needle placements with local twitch response (LTR). Pistoning technique utilized.    Manual  Therapy R shoulder A/P mobilizations, grade II-III, 30s/bout x 3 bouts; R shoulder superior to inferior mobilizations with shoulder at 90 degrees, grade II, 30s/bout x 3 bouts  STM to R UT, infraspinatus, supraspinatus, teres minor;   Pt educated throughout session about proper posture and technique with exercises. Improved exercise technique, movement at target joints, use of target muscles after min to mod verbal cues;   Pt demonstrates good motivation during session. She is able to complete all exercises as instructedwithout increase in pain. She remains weak with abduction and is unable to increase her resistance today. Performed trigger point dry needling with patient again today to help with pain and R shoulder mobility. Encouraged pt to continue withher HEP and therapist will continue to progress her strength/ROM.Pt will benefit from PT services to address deficits in strength,mobility, and painin order to return to full function at home.        PT Short Term Goals - 04/17/19 0904      PT SHORT TERM GOAL #1   Title  Pt will be independent with HEP in order to improve strength and decrease pain in order to improve pain-free function at home.    Time  4    Period  Weeks    Status  Achieved        PT Long Term Goals -  04/17/19 0904      PT LONG TERM GOAL #1   Title  Pt will decrease quick DASH score by at least 8% in order to demonstrate clinically significant reduction in disability.    Baseline  09/30/18: 84.1%; 10/30/18: 56.8%; 11/27/18: 45.45%; 12/23/18: 40.9%;     Time  8    Period  Weeks    Status  Achieved      PT LONG TERM GOAL #2   Title  Pt will decrease worst pain as reported on NPRS by at least 3 points in order to demonstrate clinically significant reduction in pain.    Baseline  09/30/18: worst: 9/10; 10/30/18: 6/10;    Time  8    Period  Weeks    Status  Achieved      PT LONG TERM GOAL #3   Title  Pt will improve PROM of R shoulder to within 10 degrees of R shoulder in order to improve functional use of R shoulder for ADLs/IADLs.    Baseline  R/L 129/150 Shoulder flexion, 100/165 Shoulder abduction, 50/93 Shoulder external rotation, 50/70 Shoulder internal rotation; 10/30/18: R/L 140/150 Shoulder flexion, 132/165 Shoulder abduction, 82/93 Shoulder external rotation, 70/70 Shoulder internal rotation; 12/23/18:  R/L 155/150 Shoulder flexion, 134/165 Shoulder abduction, 89/93 Shoulder external rotation, 70/70 Shoulder internal rotation, 01/21/19: R/L 154/150 Shoulder flexion, 136/165 Shoulder abduction, 85/93 Shoulder external rotation, 70/70 Shoulder internal rotation; 03/19/19: Unable to test over telemedicine 6/4: R: 128 flexion, 121 abduction, 78 shoulder ER, 70 shoulder IR    Time  8    Period  Weeks    Status  Partially Met    Target Date  05/14/19      PT LONG TERM GOAL #4   Title  Pt will decrease worst pain as reported on NPRS by at least 3 points (3 or less) in order to demonstrate clinically significant reduction in pain.    Baseline  10/30/18: 6/10; 11/27/18: 4/10; 12/23/18: 5/10 (improved pain frequency); 01/21/19: 5/10 (pain is less frequent); 03/19/19: 4/10; 6/4: 5/10     Time  8    Period  Weeks    Status  Partially Met  PT LONG TERM GOAL #5   Title  Pt will decrease quick DASH  score by at least 8% in order to demonstrate clinically significant reduction in disability.    Baseline  11/27/18: 45.45%; 12/23/18: 40.9%; 01/21/19: 29.5%, 6/4: 43%     Time  8    Period  Weeks    Status  On-going    Target Date  05/14/19      PT LONG TERM GOAL #6   Title  Pt will improve pain-free R shoulder strength for flexion and abduction so that it is symmetrical with L shoulder in order to return to full function at home    Baseline  12/23/18: R/L 4-/4+ Shoulder flexion, 4/4+ Shoulder abduction, both painful on R side; 01/21/19: R/L 4/4+ Shoulder flexion, 4/4+ Shoulder abduction, both painful on R side; 03/19/19: Unable to test over telemedicine 6/4: 4-/4     Time  8    Period  Weeks    Status  On-going    Target Date  05/14/19            Plan - 05/05/19 0842    Clinical Impression Statement  Pt demonstrates good motivation during session. She is able to complete all exercises as instructed without increase in pain. She remains weak with abduction and is unable to increase her resistance today. Performed trigger point dry needling with patient again today to help with pain and R shoulder mobility. Encouraged pt to continue with her HEP and therapist will continue to progress her strength/ROM. Pt will benefit from PT services to address deficits in strength, mobility, and pain in order to return to full function at home.    Rehab Potential  Good    PT Frequency  2x / week    PT Duration  8 weeks    PT Treatment/Interventions  ADLs/Self Care Home Management;Aquatic Therapy;Canalith Repostioning;Cryotherapy;Electrical Stimulation;Iontophoresis '4mg'$ /ml Dexamethasone;Moist Heat;Traction;Ultrasound;Contrast Bath;DME Instruction;Gait training;Stair training;Functional mobility training;Therapeutic activities;Therapeutic exercise;Balance training;Neuromuscular re-education;Patient/family education;Manual techniques;Passive range of motion;Dry needling;Splinting;Taping;Vestibular    PT Next Visit  Plan  Progress AAROM/AROM/strengthening of R shoulder as tolerated without pain    PT Home Exercise Plan  Cervical retractions, Scapular retractions, R upper trap stretch, supine canes for AAROM flexion with hands together progressing to AROM supine flexion, AROM sidelying shoulder abduction, AROM sidelying shoulder ER, seated pulleys for flexion and scaption ([id number])    Consulted and Agree with Plan of Care  Patient       Patient will benefit from skilled therapeutic intervention in order to improve the following deficits and impairments:  Decreased strength, Decreased range of motion, Impaired UE functional use, Pain, Impaired vision/preception  Visit Diagnosis: 1. Acute pain of right shoulder   2. Stiffness of right shoulder, not elsewhere classified   3. Muscle weakness (generalized)        Problem List Patient Active Problem List   Diagnosis Date Noted  . Chronic diarrhea 01/14/2019  . Malignant melanoma ([place]) 11/23/2016  . Urge incontinence 11/23/2016  . Atypical mole 09/22/2016  . Slurring of speech 05/12/2016  . Traumatic arthritis elbow 05/04/2015  . Left foot pain 12/31/2014  . Severe sprain of left ankle 12/31/2014  . Osteopenia 09/15/2014  . History of TIA (transient ischemic attack) 05/19/2014  . Fracture of coronoid process of ulna, left, closed 04/25/2014  . Ingrown toenail 01/15/2014  . Obesity (BMI 30-39.9) 10/08/2013  . OA (osteoarthritis) of knee 07/07/2013  . Chronic low back pain 05/09/2013  . Obstructive sleep apnea 05/09/2013  . GERD (gastroesophageal reflux  disease) 05/09/2013  . Hiatal hernia 05/09/2013  . Fibromyalgia syndrome 05/09/2013  . Depression, major, in partial remission ([place]) 05/09/2013  . RLS (restless legs syndrome) 05/09/2013  . Diabetes mellitus with no complication ([place]) 29/24/4628  . High cholesterol 05/09/2013  . 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 Family history of colon cancer 05/09/2013  . Allergic rhinitis 05/09/2013  .  Lateral meniscal tear 09/04/2012  . CAD (coronary artery disease) 09/27/2011  . HTN (hypertension) 09/27/2011  . Paroxysmal A-fib ([place]) 09/27/2011   Phillips Grout PT, DPT, GCS  Dolph Tavano 05/05/2019, 10:17 AM  [place] MAIN [facility] SERVICES [address] [place], [place], [address] Phone: [phone]   Fax:  [phone]  Name: Morgan [name] MRN: [id number] Date of Birth: 04-10-1941

## 2019-05-07 ENCOUNTER — Ambulatory Visit: Payer: Medicare Other

## 2019-05-13 ENCOUNTER — Ambulatory Visit: Payer: Medicare Other

## 2019-05-13 ENCOUNTER — Other Ambulatory Visit: Payer: Self-pay

## 2019-05-13 DIAGNOSIS — M25611 Stiffness of right shoulder, not elsewhere classified: Secondary | ICD-10-CM

## 2019-05-13 DIAGNOSIS — M25511 Pain in right shoulder: Secondary | ICD-10-CM

## 2019-05-13 DIAGNOSIS — M6281 Muscle weakness (generalized): Secondary | ICD-10-CM

## 2019-05-13 NOTE — Therapy (Addendum)
[place] MAIN [facility] SERVICES [address] [place], [place], [address] Phone: [phone]   Fax:  [phone]  Physical Therapy Treatment  Patient Details  Name: Morgan [name] MRN: [id number] Date of Birth: Apr 19, 1941 Referring Provider (PT): Dr. Mayer Camel   Encounter Date: 05/13/2019  PT End of Session - 05/13/19 1445    Visit Number  36    Number of Visits  59    Date for PT Re-Evaluation  05/14/19    Authorization Type  6/4 goals updated    PT Start Time  1440    PT Stop Time  1520    PT Time Calculation (min)  40 min    Activity Tolerance  Patient tolerated treatment well    Behavior During Therapy  [facility] for tasks assessed/performed       Past Medical History:  Diagnosis Date  . Angina   . Arthritis   . Atrial fibrillation (HCC)    ASPIRIN FOR BLOOD THINNER  . Atypical mole 09/22/2016  . Bulging discs    lumbar   . Cancer ([place])    melanoma  . Complication of anesthesia    pt states has choking sensation with ET tube   . Coronary artery disease   . Depression   . Diabetes mellitus    diet controlled/on meds  . Fibromyalgia   . GERD (gastroesophageal reflux disease)   . H/O hiatal hernia   . Headache(784.0)    "recurring"  . High cholesterol   . Hyperlipidemia   . Hypertension   . 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 Migraines    "til ~ 1980"  . PONV (postoperative nausea and vomiting)   . Restless leg syndrome   . Sciatic nerve pain    "from pinched nerve"  . Sleep apnea    uses CPAP  . Weakness of right side of body    "I've had PT for it; they don't know what it's from".  CORTISONE INJECTION INTO BACK 08/30/12    Past Surgical History:  Procedure Laterality Date  . [place] STUDY N/A 12/29/2015   Procedure: [place] STUDY;  Surgeon: Manus Gunning, MD;  Location: [facility];  Service: Gastroenterology;  Laterality: N/A;  . CARPAL TUNNEL RELEASE Left 07/02/2015   Procedure: CARPAL TUNNEL RELEASE;  Surgeon:  Frederik Pear, MD;  Location: [place];  Service: Orthopedics;  Laterality: Left;  . CATARACT EXTRACTION, BILATERAL Bilateral    Oct and Nov 2017  . CORONARY ANGIOPLASTY WITH STENT PLACEMENT  06/2011   "1"  . CORONARY ARTERY BYPASS GRAFT  2005   CABG X 2  . DILATION AND CURETTAGE OF UTERUS     "more than once"  . ELBOW ARTHROSCOPY Left 07/02/2015   Procedure: ARTHROSCOPY LEFT ELBOW WITH DEBRIDEMENT AND REMOVAL LOOSE BODY;  Surgeon: Frederik Pear, MD;  Location: [place];  Service: Orthopedics;  Laterality: Left;  . ESOPHAGEAL MANOMETRY N/A 12/29/2015   Procedure: ESOPHAGEAL MANOMETRY (EM);  Surgeon: Manus Gunning, MD;  Location: [facility];  Service: Gastroenterology;  Laterality: N/A;  . FRACTURE SURGERY  ~ 2005   nose  . KNEE ARTHROSCOPY  09/04/2012   Procedure: ARTHROSCOPY KNEE;  Surgeon: Gearlean Alf, MD;  Location: [facility];  Service: Orthopedics;  Laterality: Right;  right knee arthroscopy with medial and lateral meniscus debridement  . 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 MOUTH SURGERY  2004   "bone replacement; had cadavear  bones put in; face was collapsing"  . NASAL SEPTUM SURGERY  ~ 1986  . TOTAL KNEE ARTHROPLASTY Right 07/07/2013   Procedure: RIGHT TOTAL KNEE ARTHROPLASTY;  Surgeon: Gearlean Alf, MD;  Location: [facility];  Service: Orthopedics;  Laterality: Right;    There were no vitals filed for this visit.  Subjective Assessment - 05/13/19 1441    Subjective  Pt states that she did have some soreness for about 2 days after dry needling last week but then the shoulder pain improved. She denies any R shoulder pain at rest upon arrival today. She had a fall last Saturday when stepping up onto a curb and fell backwards onto the road. She reports that struck her head. A few strangers helped her up. She reports that she was sore the next day. She denies any neurological symptoms since the fall such as focal  numbness/tingling, focal weakness, changes in vision, or changes in speech. No specific questions or concerns currently.    Pertinent History  On 08/03/18 pt was carrying a folding table and fell and right arm was under the handle got stuck between the door and the table. She landed on her R side. Tried to get up and had to pull at her arm. Pt had immediate pain in her right shoulder. She waited until Monday and went to see Dr. Edilia Bo her PCP. He took radiographs but didn't find any fractures. She continued to have pain so she called Dr. Damita Dunnings office, her orthopedist, and saw him that same week. He took additional radiographs and found a hairline fracture in her humeral shaft and a greater tuberosity fracture per patient report. She states that she is not healing well and has recurrent plain film radiographs. She is still wearing a sling and was encouraged not to perform any AROM of her right shoulder. Since her last visit with Dr. Mayer Camel on 09/20/18 she has had an additional fall and landed on her R shoulder. She reports additional bruising and pain in her R shoulder. She has a follow-up appt scheduled with her orthopedist for 10/15/18. 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 ROS negative for red flags.    Limitations  Lifting    Diagnostic tests  Plain film radiographs with decreased healing per pt report    Patient Stated Goals  Improve ability to use RUE and decrease pain    Currently in Pain?  No/denies          TREATMENT    Ther-ex UBE 34mn forward/258m backwards for warm-up during history; Wall push-ups 2 x 10; Wall slides for flexion x 15, tactile cues for proper form; Wash cloth on wall for horizontal abduction/adduction x15 each direction; Standing R shoulder flexionand abduction 1# dumbbell (DB)x 10 each with assist to get through the final 20-30 degrees of available ROM at end range; L sidelying R shoulder abduction2# DBx 15, 3# x 15; L sidelying  R shoulder ER2# DB2 x 15 L sidelying R shoulder horizontal abduction 1# DB 2 x 15;    Trigger Point Dry Needling (TDN), unbilled Education performed with patient regarding potential benefit of TDN. Reviewed precautions and risks with patient. Reviewed special precautions/risks over lung fields which include pneumothorax. Reviewed signs and symptoms of pneumothorax and advised pt to go to ER immediately if these symptoms develop advise them of dry needling treatment. Extensive time spent with pt to ensure full understanding of TDN risks. Pt provided verbal consent to treatment. TDN performed  toR upper trapwith 3,0.3x 50 single needle placements with local twitch response (LTR).Pistoning technique utilized.   Manual Therapy STM to R UT, infraspinatus, supraspinatus, teres minor with trigger point release over upper trap;   Pt educated throughout session about proper posture and technique with exercises. Improved exercise technique, movement at target joints, use of target muscles after min to mod verbal cues;   Pt demonstrates good motivation during session. She is able to complete all exercises as instructedwithout increase in pain.She is able to increase resistance with sidelying abduction today. She continues to lack end range strength in flexion and abduction against gravity. Performed trigger point dry needling with patient again today to help with pain and R shoulder mobility. Significant trigger point noted in R upper trap.Encouraged pt to continue withher HEP and therapist will continue to progress her strength/ROM.Pt will benefit from PT services to address deficits in strength,mobility, and painin order to return to full function at home.                       PT Short Term Goals - 04/17/19 0904      PT SHORT TERM GOAL #1   Title  Pt will be independent with HEP in order to improve strength and decrease pain in order to improve pain-free  function at home.    Time  4    Period  Weeks    Status  Achieved        PT Long Term Goals - 04/17/19 0904      PT LONG TERM GOAL #1   Title  Pt will decrease quick DASH score by at least 8% in order to demonstrate clinically significant reduction in disability.    Baseline  09/30/18: 84.1%; 10/30/18: 56.8%; 11/27/18: 45.45%; 12/23/18: 40.9%;     Time  8    Period  Weeks    Status  Achieved      PT LONG TERM GOAL #2   Title  Pt will decrease worst pain as reported on NPRS by at least 3 points in order to demonstrate clinically significant reduction in pain.    Baseline  09/30/18: worst: 9/10; 10/30/18: 6/10;    Time  8    Period  Weeks    Status  Achieved      PT LONG TERM GOAL #3   Title  Pt will improve PROM of R shoulder to within 10 degrees of R shoulder in order to improve functional use of R shoulder for ADLs/IADLs.    Baseline  R/L 129/150 Shoulder flexion, 100/165 Shoulder abduction, 50/93 Shoulder external rotation, 50/70 Shoulder internal rotation; 10/30/18: R/L 140/150 Shoulder flexion, 132/165 Shoulder abduction, 82/93 Shoulder external rotation, 70/70 Shoulder internal rotation; 12/23/18:  R/L 155/150 Shoulder flexion, 134/165 Shoulder abduction, 89/93 Shoulder external rotation, 70/70 Shoulder internal rotation, 01/21/19: R/L 154/150 Shoulder flexion, 136/165 Shoulder abduction, 85/93 Shoulder external rotation, 70/70 Shoulder internal rotation; 03/19/19: Unable to test over telemedicine 6/4: R: 128 flexion, 121 abduction, 78 shoulder ER, 70 shoulder IR    Time  8    Period  Weeks    Status  Partially Met    Target Date  05/14/19      PT LONG TERM GOAL #4   Title  Pt will decrease worst pain as reported on NPRS by at least 3 points (3 or less) in order to demonstrate clinically significant reduction in pain.    Baseline  10/30/18: 6/10; 11/27/18: 4/10; 12/23/18: 5/10 (improved pain frequency); 01/21/19: 5/10 (  pain is less frequent); 03/19/19: 4/10; 6/4: 5/10     Time  8     Period  Weeks    Status  Partially Met      PT LONG TERM GOAL #5   Title  Pt will decrease quick DASH score by at least 8% in order to demonstrate clinically significant reduction in disability.    Baseline  11/27/18: 45.45%; 12/23/18: 40.9%; 01/21/19: 29.5%, 6/4: 43%     Time  8    Period  Weeks    Status  On-going    Target Date  05/14/19      PT LONG TERM GOAL #6   Title  Pt will improve pain-free R shoulder strength for flexion and abduction so that it is symmetrical with L shoulder in order to return to full function at home    Baseline  12/23/18: R/L 4-/4+ Shoulder flexion, 4/4+ Shoulder abduction, both painful on R side; 01/21/19: R/L 4/4+ Shoulder flexion, 4/4+ Shoulder abduction, both painful on R side; 03/19/19: Unable to test over telemedicine 6/4: 4-/4     Time  8    Period  Weeks    Status  On-going    Target Date  05/14/19            Plan - 05/13/19 1446    Clinical Impression Statement  Pt demonstrates good motivation during session. She is able to complete all exercises as instructed without increase in pain. She is able to increase resistance with sidelying abduction today. She continues to lack end range strength in flexion and abduction against gravity. Performed trigger point dry needling with patient again today to help with pain and R shoulder mobility. Significant trigger point noted in R upper trap. Encouraged pt to continue with her HEP and therapist will continue to progress her strength/ROM. Pt will benefit from PT services to address deficits in strength, mobility, and pain in order to return to full function at home.    Rehab Potential  Good    PT Frequency  2x / week    PT Duration  8 weeks    PT Treatment/Interventions  ADLs/Self Care Home Management;Aquatic Therapy;Canalith Repostioning;Cryotherapy;Electrical Stimulation;Iontophoresis 40m/ml Dexamethasone;Moist Heat;Traction;Ultrasound;Contrast Bath;DME Instruction;Gait training;Stair training;Functional  mobility training;Therapeutic activities;Therapeutic exercise;Balance training;Neuromuscular re-education;Patient/family education;Manual techniques;Passive range of motion;Dry needling;Splinting;Taping;Vestibular    PT Next Visit Plan  Progress AAROM/AROM/strengthening of R shoulder as tolerated without pain    PT Home Exercise Plan  Cervical retractions, Scapular retractions, R upper trap stretch, supine canes for AAROM flexion with hands together progressing to AROM supine flexion, AROM sidelying shoulder abduction, AROM sidelying shoulder ER, seated pulleys for flexion and scaption ([id number])    Consulted and Agree with Plan of Care  Patient       Patient will benefit from skilled therapeutic intervention in order to improve the following deficits and impairments:  Decreased strength, Decreased range of motion, Impaired UE functional use, Pain, Impaired vision/preception  Visit Diagnosis: 1. Acute pain of right shoulder   2. Stiffness of right shoulder, not elsewhere classified   3. Muscle weakness (generalized)        Problem List Patient Active Problem List   Diagnosis Date Noted  . Chronic diarrhea 01/14/2019  . Malignant melanoma ([place] 11/23/2016  . Urge incontinence 11/23/2016  . Atypical mole 09/22/2016  . Slurring of speech 05/12/2016  . Traumatic arthritis elbow 05/04/2015  . Left foot pain 12/31/2014  . Severe sprain of left ankle 12/31/2014  . Osteopenia 09/15/2014  . History of TIA (  transient ischemic attack) 05/19/2014  . Fracture of coronoid process of ulna, left, closed 04/25/2014  . Ingrown toenail 01/15/2014  . Obesity (BMI 30-39.9) 10/08/2013  . OA (osteoarthritis) of knee 07/07/2013  . Chronic low back pain 05/09/2013  . Obstructive sleep apnea 05/09/2013  . GERD (gastroesophageal reflux disease) 05/09/2013  . Hiatal hernia 05/09/2013  . Fibromyalgia syndrome 05/09/2013  . Depression, major, in partial remission ([place]) 05/09/2013  . RLS (restless legs  syndrome) 05/09/2013  . Diabetes mellitus with no complication ([place]) 27/63/9432  . High cholesterol 05/09/2013  . 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 Family history of colon cancer 05/09/2013  . Allergic rhinitis 05/09/2013  . Lateral meniscal tear 09/04/2012  . CAD (coronary artery disease) 09/27/2011  . HTN (hypertension) 09/27/2011  . Paroxysmal A-fib ([place]) 09/27/2011   Phillips Grout PT, DPT, GCS  Sharetha Newson 05/14/2019, 9:46 AM  [place] MAIN [facility] SERVICES [address] [place], [place], [address] Phone: [phone]   Fax:  [phone]  Name: Angila Wombles MRN: [id number] Date of Birth: 1941/08/04

## 2019-05-15 ENCOUNTER — Ambulatory Visit: Payer: Medicare Other | Attending: Orthopedic Surgery

## 2019-05-15 DIAGNOSIS — M6281 Muscle weakness (generalized): Secondary | ICD-10-CM | POA: Insufficient documentation

## 2019-05-15 DIAGNOSIS — M542 Cervicalgia: Secondary | ICD-10-CM | POA: Insufficient documentation

## 2019-05-15 DIAGNOSIS — M25611 Stiffness of right shoulder, not elsewhere classified: Secondary | ICD-10-CM | POA: Insufficient documentation

## 2019-05-15 DIAGNOSIS — M25511 Pain in right shoulder: Secondary | ICD-10-CM | POA: Insufficient documentation

## 2019-05-15 DIAGNOSIS — M5412 Radiculopathy, cervical region: Secondary | ICD-10-CM | POA: Insufficient documentation

## 2019-05-20 ENCOUNTER — Other Ambulatory Visit: Payer: Self-pay

## 2019-05-20 ENCOUNTER — Ambulatory Visit: Payer: Medicare Other

## 2019-05-20 DIAGNOSIS — M25611 Stiffness of right shoulder, not elsewhere classified: Secondary | ICD-10-CM

## 2019-05-20 DIAGNOSIS — M6281 Muscle weakness (generalized): Secondary | ICD-10-CM

## 2019-05-20 DIAGNOSIS — M25511 Pain in right shoulder: Secondary | ICD-10-CM

## 2019-05-20 DIAGNOSIS — M5412 Radiculopathy, cervical region: Secondary | ICD-10-CM | POA: Diagnosis present

## 2019-05-20 DIAGNOSIS — M542 Cervicalgia: Secondary | ICD-10-CM | POA: Diagnosis not present

## 2019-05-20 NOTE — Therapy (Addendum)
[place] MAIN [facility] SERVICES [address] [place], [place], [address] Phone: [phone]   Fax:  [phone]  Physical Therapy Treatment/Recertification  Patient Details  Name: Morgan [name] MRN: [id number] Date of Birth: Apr 13, 1941 Referring Provider (PT): Dr. Mayer Camel   Encounter Date: 05/20/2019  PT End of Session - 05/20/19 1324    Visit Number  37    Number of Visits  65    Date for PT Re-Evaluation  07/15/19    Authorization Type  6/4 goals updated    PT Start Time  1015    PT Stop Time  1100    PT Time Calculation (min)  45 min    Activity Tolerance  Patient tolerated treatment well    Behavior During Therapy  [facility] for tasks assessed/performed       Past Medical History:  Diagnosis Date  . Angina   . Arthritis   . Atrial fibrillation (HCC)    ASPIRIN FOR BLOOD THINNER  . Atypical mole 09/22/2016  . Bulging discs    lumbar   . Cancer ([place])    melanoma  . Complication of anesthesia    pt states has choking sensation with ET tube   . Coronary artery disease   . Depression   . Diabetes mellitus    diet controlled/on meds  . Fibromyalgia   . GERD (gastroesophageal reflux disease)   . H/O hiatal hernia   . Headache(784.0)    "recurring"  . High cholesterol   . Hyperlipidemia   . Hypertension   . 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 Migraines    "til ~ 1980"  . PONV (postoperative nausea and vomiting)   . Restless leg syndrome   . Sciatic nerve pain    "from pinched nerve"  . Sleep apnea    uses CPAP  . Weakness of right side of body    "I've had PT for it; they don't know what it's from".  CORTISONE INJECTION INTO BACK 08/30/12    Past Surgical History:  Procedure Laterality Date  . [place] STUDY N/A 12/29/2015   Procedure: [place] STUDY;  Surgeon: Manus Gunning, MD;  Location: [facility];  Service: Gastroenterology;  Laterality: N/A;  . CARPAL TUNNEL RELEASE Left 07/02/2015   Procedure: CARPAL TUNNEL  RELEASE;  Surgeon: Frederik Pear, MD;  Location: [place];  Service: Orthopedics;  Laterality: Left;  . CATARACT EXTRACTION, BILATERAL Bilateral    Oct and Nov 2017  . CORONARY ANGIOPLASTY WITH STENT PLACEMENT  06/2011   "1"  . CORONARY ARTERY BYPASS GRAFT  2005   CABG X 2  . DILATION AND CURETTAGE OF UTERUS     "more than once"  . ELBOW ARTHROSCOPY Left 07/02/2015   Procedure: ARTHROSCOPY LEFT ELBOW WITH DEBRIDEMENT AND REMOVAL LOOSE BODY;  Surgeon: Frederik Pear, MD;  Location: [place];  Service: Orthopedics;  Laterality: Left;  . ESOPHAGEAL MANOMETRY N/A 12/29/2015   Procedure: ESOPHAGEAL MANOMETRY (EM);  Surgeon: Manus Gunning, MD;  Location: [facility];  Service: Gastroenterology;  Laterality: N/A;  . FRACTURE SURGERY  ~ 2005   nose  . KNEE ARTHROSCOPY  09/04/2012   Procedure: ARTHROSCOPY KNEE;  Surgeon: Gearlean Alf, MD;  Location: [facility];  Service: Orthopedics;  Laterality: Right;  right knee arthroscopy with medial and lateral meniscus debridement  . 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 MOUTH SURGERY  2004   "bone replacement; had cadavear  bones put in; face was collapsing"  . NASAL SEPTUM SURGERY  ~ 1986  . TOTAL KNEE ARTHROPLASTY Right 07/07/2013   Procedure: RIGHT TOTAL KNEE ARTHROPLASTY;  Surgeon: Gearlean Alf, MD;  Location: [facility];  Service: Orthopedics;  Laterality: Right;     There were no vitals filed for this visit.  Subjective Assessment - 05/20/19 1022    Subjective  Pt reports that she is doing well today.  She has no pain today. She was sore for about a day after her dry needling last week but after that feels like it helped with her pain. No specific questions or concerns.    Pertinent History  On 08/03/18 pt was carrying a folding table and fell and right arm was under the handle got stuck between the door and the table. She landed on her R side. Tried to get up and had to pull at her arm. Pt had  immediate pain in her right shoulder. She waited until Monday and went to see Dr. Edilia Bo her PCP. He took radiographs but didn't find any fractures. She continued to have pain so she called Dr. Damita Dunnings office, her orthopedist, and saw him that same week. He took additional radiographs and found a hairline fracture in her humeral shaft and a greater tuberosity fracture per patient report. She states that she is not healing well and has recurrent plain film radiographs. She is still wearing a sling and was encouraged not to perform any AROM of her right shoulder. Since her last visit with Dr. Mayer Camel on 09/20/18 she has had an additional fall and landed on her R shoulder. She reports additional bruising and pain in her R shoulder. She has a follow-up appt scheduled with her orthopedist for 10/15/18. 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 ROS negative for red flags.    Limitations  Lifting    Diagnostic tests  Plain film radiographs with decreased healing per pt report    Patient Stated Goals  Improve ability to use RUE and decrease pain    Currently in Pain?  No/denies    Pain Score  --          TREATMENT   Ther-ex UBE 40mn forward/277m backwards for warm-up during history; UE ranger R flexion, abduction, clockwise and counterclockwise x10 each direction Wall push-ups x 10 Standing push ups on parallel bars; needs tactile cuing for technique x10 B Standing rows, R shoulder extension, R ER with green theraband, with tactile cueing for technique 2 x 10 in each direction;  Resisted abduction in supine x 10 L sidelying R shoulder abduction3# x 15; L sidelying R shoulder horizontal abduction 1# DB x 10;   Trigger Point Dry Needling (TDN), unbilled Education performed with patient regarding potential benefit of TDN. Reviewed precautions and risks with patient. Pt provided verbal consent to treatment. TDN performed toR upper trapwith2,0.3x 50 single needle  placements with local twitch response (LTR).Pistoning technique utilized.   Manual Therapy  Trigger point release over upper trap; R shoulder posterior and inferior mobilizations at available end range flexion, grade II-III, 30s/bout x 2 bouts to promote increased flexion; R shoulder inferior mobilizations at 90 abduction, grade II-III, 30s/bout x 2 bouts and abduction at end range;     Pt educated throughout session about proper posture and technique with exercises. Improved exercise technique, movement at target joints, use of target muscles after min to mod verbal cues;   Pt demonstrates good motivation during session.  She is able to complete all exercises as instructedwithout increase in pain.She is able to progress to standing push ups at II bars. She continues to lack end range strength in flexion and abduction against gravity.  Joint mobs performed to increase end range of motion.  Performed trigger point dry needling with patient againtoday to help with pain and R shoulder mobility. Significant trigger point noted in R upper trap.Encouraged pt to continue withher HEP and therapist will continue to progress her strength/ROM.Pt will benefit from PT services to address deficits in strength,mobility, and painin order to return to full function at home.                        PT Short Term Goals - 04/17/19 0904      PT SHORT TERM GOAL #1   Title  Pt will be independent with HEP in order to improve strength and decrease pain in order to improve pain-free function at home.    Time  4    Period  Weeks    Status  Achieved        PT Long Term Goals - 04/17/19 0904      PT LONG TERM GOAL #1   Title  Pt will decrease quick DASH score by at least 8% in order to demonstrate clinically significant reduction in disability.    Baseline  09/30/18: 84.1%; 10/30/18: 56.8%; 11/27/18: 45.45%; 12/23/18: 40.9%;     Time  8    Period  Weeks    Status  Achieved       PT LONG TERM GOAL #2   Title  Pt will decrease worst pain as reported on NPRS by at least 3 points in order to demonstrate clinically significant reduction in pain.    Baseline  09/30/18: worst: 9/10; 10/30/18: 6/10;    Time  8    Period  Weeks    Status  Achieved      PT LONG TERM GOAL #3   Title  Pt will improve PROM of R shoulder to within 10 degrees of R shoulder in order to improve functional use of R shoulder for ADLs/IADLs.    Baseline  R/L 129/150 Shoulder flexion, 100/165 Shoulder abduction, 50/93 Shoulder external rotation, 50/70 Shoulder internal rotation; 10/30/18: R/L 140/150 Shoulder flexion, 132/165 Shoulder abduction, 82/93 Shoulder external rotation, 70/70 Shoulder internal rotation; 12/23/18:  R/L 155/150 Shoulder flexion, 134/165 Shoulder abduction, 89/93 Shoulder external rotation, 70/70 Shoulder internal rotation, 01/21/19: R/L 154/150 Shoulder flexion, 136/165 Shoulder abduction, 85/93 Shoulder external rotation, 70/70 Shoulder internal rotation; 03/19/19: Unable to test over telemedicine 6/4: R: 128 flexion, 121 abduction, 78 shoulder ER, 70 shoulder IR    Time  8    Period  Weeks    Status  Partially Met    Target Date  05/14/19      PT LONG TERM GOAL #4   Title  Pt will decrease worst pain as reported on NPRS by at least 3 points (3 or less) in order to demonstrate clinically significant reduction in pain.    Baseline  10/30/18: 6/10; 11/27/18: 4/10; 12/23/18: 5/10 (improved pain frequency); 01/21/19: 5/10 (pain is less frequent); 03/19/19: 4/10; 6/4: 5/10     Time  8    Period  Weeks    Status  Partially Met      PT LONG TERM GOAL #5   Title  Pt will decrease quick DASH score by at least 8% in order to demonstrate clinically significant reduction  in disability.    Baseline  11/27/18: 45.45%; 12/23/18: 40.9%; 01/21/19: 29.5%, 6/4: 43%     Time  8    Period  Weeks    Status  On-going    Target Date  05/14/19      PT LONG TERM GOAL #6   Title  Pt will improve pain-free R  shoulder strength for flexion and abduction so that it is symmetrical with L shoulder in order to return to full function at home    Baseline  12/23/18: R/L 4-/4+ Shoulder flexion, 4/4+ Shoulder abduction, both painful on R side; 01/21/19: R/L 4/4+ Shoulder flexion, 4/4+ Shoulder abduction, both painful on R side; 03/19/19: Unable to test over telemedicine 6/4: 4-/4     Time  8    Period  Weeks    Status  On-going    Target Date  05/14/19            Plan - 05/20/19 1104    Clinical Impression Statement  Pt demonstrates good motivation during session. She is able to complete all exercises as instructed without increase in pain. She is able to progress to standing push ups at parallel bars. She continues to lack end range strength in flexion and abduction against gravity.  Joint mobs performed to increase end range of motion.  Performed trigger point dry needling with patient again today to help with pain and R shoulder mobility. Significant trigger point noted in R upper trap. Encouraged pt to continue with her HEP and therapist will continue to progress her strength/ROM. Pt will benefit from PT services to address deficits in strength, mobility, and pain in order to return to full function at home.    Rehab Potential  Good    PT Frequency  2x / week    PT Duration  8 weeks    PT Treatment/Interventions  ADLs/Self Care Home Management;Aquatic Therapy;Canalith Repostioning;Cryotherapy;Electrical Stimulation;Iontophoresis 46m/ml Dexamethasone;Moist Heat;Traction;Ultrasound;Contrast Bath;DME Instruction;Gait training;Stair training;Functional mobility training;Therapeutic activities;Therapeutic exercise;Balance training;Neuromuscular re-education;Patient/family education;Manual techniques;Passive range of motion;Dry needling;Splinting;Taping;Vestibular    PT Next Visit Plan  Progress AAROM/AROM/strengthening of R shoulder as tolerated without pain    PT Home Exercise Plan  Cervical retractions, Scapular  retractions, R upper trap stretch, supine canes for AAROM flexion with hands together progressing to AROM supine flexion, AROM sidelying shoulder abduction, AROM sidelying shoulder ER, seated pulleys for flexion and scaption ([id number])    Consulted and Agree with Plan of Care  Patient       Patient will benefit from skilled therapeutic intervention in order to improve the following deficits and impairments:  Decreased strength, Decreased range of motion, Impaired UE functional use, Pain, Impaired vision/preception  Visit Diagnosis: 1. Acute pain of right shoulder   2. Stiffness of right shoulder, not elsewhere classified   3. Muscle weakness (generalized)        Problem List Patient Active Problem List   Diagnosis Date Noted  . Chronic diarrhea 01/14/2019  . [place]ignant [place] 11/23/2016  . Urge incontinence 11/23/2016  . Atypical mole 09/22/2016  . Slurring of speech 05/12/2016  . Traumatic arthritis elbow 05/04/2015  . Left foot pain 12/31/2014  . Severe sprain of left ankle 12/31/2014  . Osteopenia 09/15/2014  . History of TIA (transient ischemic attack) 05/19/2014  . Fracture of coronoid process of ulna, left, closed 04/25/2014  . Ingrown toenail 01/15/2014  . Obesity (BMI 30-39.9) 10/08/2013  . OA (osteoarthritis) of knee 07/07/2013  . Chronic low back pain 05/09/2013  . Obstructive sleep apnea 05/09/2013  .  GERD (gastroesophageal reflux disease) 05/09/2013  . Hiatal hernia 05/09/2013  . Fibromyalgia syndrome 05/09/2013  . Depression, major, in partial remission ([place]) 05/09/2013  . RLS (restless legs syndrome) 05/09/2013  . Diabetes mellitus with no complication ([place]) 21/82/8833  . High cholesterol 05/09/2013  . 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 Family history of colon cancer 05/09/2013  . Allergic rhinitis 05/09/2013  . Lateral meniscal tear 09/04/2012  . CAD (coronary artery disease) 09/27/2011  . HTN (hypertension) 09/27/2011  . Paroxysmal A-fib ([place])  09/27/2011    Phillips Grout PT, DPT, GCS  Reegan Mctighe 05/20/2019, 1:35 PM  [place] MAIN [facility] SERVICES [address] [place], [place], [address] Phone: [phone]   Fax:  [phone]  Name: Morgan [name] MRN: [id number] Date of Birth: 20-Jul-1941

## 2019-05-22 ENCOUNTER — Ambulatory Visit: Payer: Medicare Other

## 2019-05-22 ENCOUNTER — Other Ambulatory Visit: Payer: Self-pay

## 2019-05-22 DIAGNOSIS — M25511 Pain in right shoulder: Secondary | ICD-10-CM

## 2019-05-22 DIAGNOSIS — M542 Cervicalgia: Secondary | ICD-10-CM | POA: Diagnosis not present

## 2019-05-22 DIAGNOSIS — M6281 Muscle weakness (generalized): Secondary | ICD-10-CM

## 2019-05-22 DIAGNOSIS — M25611 Stiffness of right shoulder, not elsewhere classified: Secondary | ICD-10-CM

## 2019-05-22 NOTE — Addendum Note (Signed)
Addended by: Roxana Hires D on: 05/22/2019 08:28 PM   Modules accepted: Orders

## 2019-05-22 NOTE — Therapy (Signed)
[place] MAIN [facility] SERVICES [address] [place], [place], [address] Phone: [phone]   Fax:  [phone]  Physical Therapy Treatment  Patient Details  Name: Morgan [name] MRN: [id number] Date of Birth: [birth year] Referring Provider (PT): Dr. Mayer Camel   Encounter Date: 05/22/2019  PT End of Session - 05/22/19 1023    Visit Number  38    Number of Visits  65    Date for PT Re-Evaluation  07/15/19    Authorization Type  6/4 goals updated, updated again today 05/22/19;    PT Start Time  1020    PT Stop Time  1100    PT Time Calculation (min)  40 min    Activity Tolerance  Patient tolerated treatment well    Behavior During Therapy  [facility] for tasks assessed/performed       Past Medical History:  Diagnosis Date  . Angina   . Arthritis   . Atrial fibrillation (HCC)    ASPIRIN FOR BLOOD THINNER  . Atypical mole 09/22/2016  . Bulging discs    lumbar   . Cancer ([place])    melanoma  . Complication of anesthesia    pt states has choking sensation with ET tube   . Coronary artery disease   . Depression   . Diabetes mellitus    diet controlled/on meds  . Fibromyalgia   . GERD (gastroesophageal reflux disease)   . H/O hiatal hernia   . Headache(784.0)    "recurring"  . High cholesterol   . Hyperlipidemia   . Hypertension   . 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 Migraines    "til ~ 1980"  . PONV (postoperative nausea and vomiting)   . Restless leg syndrome   . Sciatic nerve pain    "from pinched nerve"  . Sleep apnea    uses CPAP  . Weakness of right side of body    "I've had PT for it; they don't know what it's from".  CORTISONE INJECTION INTO BACK 08/30/12    Past Surgical History:  Procedure Laterality Date  . [place] STUDY N/A 12/29/2015   Procedure: [place] STUDY;  Surgeon: Manus Gunning, MD;  Location: [facility];  Service: Gastroenterology;  Laterality: N/A;  . CARPAL TUNNEL RELEASE Left 07/02/2015   Procedure: CARPAL  TUNNEL RELEASE;  Surgeon: Frederik Pear, MD;  Location: [place];  Service: Orthopedics;  Laterality: Left;  . CATARACT EXTRACTION, BILATERAL Bilateral    Oct and Nov 2017  . CORONARY ANGIOPLASTY WITH STENT PLACEMENT  06/2011   "1"  . CORONARY ARTERY BYPASS GRAFT  2005   CABG X 2  . DILATION AND CURETTAGE OF UTERUS     "more than once"  . ELBOW ARTHROSCOPY Left 07/02/2015   Procedure: ARTHROSCOPY LEFT ELBOW WITH DEBRIDEMENT AND REMOVAL LOOSE BODY;  Surgeon: Frederik Pear, MD;  Location: [place];  Service: Orthopedics;  Laterality: Left;  . ESOPHAGEAL MANOMETRY N/A 12/29/2015   Procedure: ESOPHAGEAL MANOMETRY (EM);  Surgeon: Manus Gunning, MD;  Location: [facility];  Service: Gastroenterology;  Laterality: N/A;  . FRACTURE SURGERY  ~ 2005   nose  . KNEE ARTHROSCOPY  09/04/2012   Procedure: ARTHROSCOPY KNEE;  Surgeon: Gearlean Alf, MD;  Location: [facility];  Service: Orthopedics;  Laterality: Right;  right knee arthroscopy with medial and lateral meniscus debridement  . 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 MOUTH SURGERY  2004   "  bone replacement; had cadavear bones put in; face was collapsing"  . NASAL SEPTUM SURGERY  ~ 1986  . TOTAL KNEE ARTHROPLASTY Right 07/07/2013   Procedure: RIGHT TOTAL KNEE ARTHROPLASTY;  Surgeon: Gearlean Alf, MD;  Location: [facility];  Service: Orthopedics;  Laterality: Right;    There were no vitals filed for this visit.  Subjective Assessment - 05/22/19 1022    Subjective  Pt reports that she is doing well today.  She has no pain today. She was sore yesterday after the dry needling but no soreness today. No specific questions or concerns.    Pertinent History  On 08/03/18 pt was carrying a folding table and fell and right arm was under the handle got stuck between the door and the table. She landed on her R side. Tried to get up and had to pull at her arm. Pt had immediate pain in her right shoulder.  She waited until Monday and went to see Dr. Edilia Bo her PCP. He took radiographs but didn't find any fractures. She continued to have pain so she called Dr. Damita Dunnings office, her orthopedist, and saw him that same week. He took additional radiographs and found a hairline fracture in her humeral shaft and a greater tuberosity fracture per patient report. She states that she is not healing well and has recurrent plain film radiographs. She is still wearing a sling and was encouraged not to perform any AROM of her right shoulder. Since her last visit with Dr. Mayer Camel on 09/20/18 she has had an additional fall and landed on her R shoulder. She reports additional bruising and pain in her R shoulder. She has a follow-up appt scheduled with her orthopedist for 10/15/18. 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 ROS negative for red flags.    Limitations  Lifting    Diagnostic tests  Plain film radiographs with decreased healing per pt report    Patient Stated Goals  Improve ability to use RUE and decrease pain    Currently in Pain?  No/denies         TREATMENT   Manual Therapy  UBE 64mn forward/210m backwards for warm-up during history; Pt completed quick DASH: 43.2% (unbilled); Updated outcome measures and goals with patient Measured R shoulder AAROM in supine: R: 148 flexion (154 L side), 145 abduction (166 L side), 85 shoulder ER, 76 shoulder IR Measured R shoulder strength: R/L: Flexion: 4/4+ (no pain bilateral) Abduction: 4-/4 (painful on R side, no pain on L side); R shoulder PROM for flexion and abduction with end range holds.    Trigger Point Dry Needling (TDN) Education performed with patient regarding potential benefit of TDN. Reviewed precautions and risks with patient. Reviewed special precautions/risks over lung fields which include pneumothorax. Reviewed signs and symptoms of pneumothorax and advised pt to go to ER immediately if these symptoms develop advise  them of dry needling treatment. Extensive time spent with pt to ensure full understanding of TDN risks. Pt provided verbal consent to treatment. TDN performed toRupper trapwith3,0.3x 50 single needle placements with local twitch response (LTR).Pistoning technique utilized.   Pt educated throughout session about proper posture and technique with exercises. Improved exercise technique, movement at target joints, use of target muscles after min to mod verbal cues;   Pt demonstrates good motivation during session. Updated outcome measures and goals with patient. She demonstrates quickDASH which is essentially unchanged from the last time is was updated in early June. Her R shoulder  flexion, ER, and IR is within 10 degrees of the L shoulder however she remains most limited in R shoulder abduction. Her R shoulder flexion and abduction strength are also slightly weaker than the L side and she struggles the most with strength overhead toward end of available range of motion. Performed trigger point dry needling with patient againtoday to help with pain and R shoulder mobility.Significant trigger point noted in R upper trap.Encouraged pt to continue withher HEP and therapist will continue to progress her strength/ROM.Pt will benefit from PT services to address deficits in strength,mobility, and painin order to return to full function at home.                         PT Short Term Goals - 05/22/19 1025      PT SHORT TERM GOAL #1   Title  Pt will be independent with HEP in order to improve strength and decrease pain in order to improve pain-free function at home.    Time  4    Period  Weeks    Status  Achieved        PT Long Term Goals - 05/22/19 1025      PT LONG TERM GOAL #1   Title  Pt will decrease quick DASH score by at least 8% in order to demonstrate clinically significant reduction in disability.    Baseline  09/30/18: 84.1%; 10/30/18: 56.8%; 11/27/18:  45.45%; 12/23/18: 40.9%;     Time  8    Period  Weeks    Status  Achieved      PT LONG TERM GOAL #2   Title  Pt will decrease worst pain as reported on NPRS by at least 3 points in order to demonstrate clinically significant reduction in pain.    Baseline  09/30/18: worst: 9/10; 10/30/18: 6/10;    Time  8    Period  Weeks    Status  Achieved      PT LONG TERM GOAL #3   Title  Pt will improve PROM of R shoulder to within 10 degrees of R shoulder in order to improve functional use of R shoulder for ADLs/IADLs.    Baseline  R/L 129/150 Shoulder flexion, 100/165 Shoulder abduction, 50/93 Shoulder external rotation, 50/70 Shoulder internal rotation; 10/30/18: R/L 140/150 Shoulder flexion, 132/165 Shoulder abduction, 82/93 Shoulder external rotation, 70/70 Shoulder internal rotation; 12/23/18:  R/L 155/150 Shoulder flexion, 134/165 Shoulder abduction, 89/93 Shoulder external rotation, 70/70 Shoulder internal rotation, 01/21/19: R/L 154/150 Shoulder flexion, 136/165 Shoulder abduction, 85/93 Shoulder external rotation, 70/70 Shoulder internal rotation; 03/19/19: Unable to test over telemedicine 6/4: R: 128 flexion, 121 abduction, 78 shoulder ER, 70 shoulder IR; 05/22/19: R: 148 flexion (154 L side), 145 abduction (166 L side), 85 shoulder ER, 76 shoulder IR    Time  8    Period  Weeks    Status  Partially Met    Target Date  07/15/19      PT LONG TERM GOAL #4   Title  Pt will decrease worst pain as reported on NPRS by at least 3 points (3 or less) in order to demonstrate clinically significant reduction in pain.    Baseline  10/30/18: 6/10; 11/27/18: 4/10; 12/23/18: 5/10 (improved pain frequency); 01/21/19: 5/10 (pain is less frequent); 03/19/19: 4/10; 6/4: 5/10; 05/22/19: 4/10 (decreased frequency of pain as well);    Time  8    Period  Weeks    Status  Partially Met  Target Date  07/15/19      PT LONG TERM GOAL #5   Title  Pt will decrease quick DASH score by at least 8% in order to demonstrate  clinically significant reduction in disability.    Baseline  11/27/18: 45.45%; 12/23/18: 40.9%; 01/21/19: 29.5%, 6/4: 43%; 05/22/19: 43.2%    Time  8    Period  Weeks    Status  Partially Met    Target Date  07/15/19      PT LONG TERM GOAL #6   Title  Pt will improve pain-free R shoulder strength for flexion and abduction so that it is symmetrical with L shoulder in order to return to full function at home    Baseline  12/23/18: R/L 4-/4+ Shoulder flexion, 4/4+ Shoulder abduction, both painful on R side; 01/21/19: R/L 4/4+ Shoulder flexion, 4/4+ Shoulder abduction, both painful on R side; 03/19/19: Unable to test over telemedicine 6/4: 4-/4; 05/22/19: R/L: Flexion: 4/4+ (no pain bilateral) Abduction: 4-/4 (painful on R side, no pain on L side);    Time  8    Period  Weeks    Status  Partially Met    Target Date  07/15/19            Plan - 05/22/19 1610    Clinical Impression Statement  Pt demonstrates good motivation during session. Updated outcome measures and goals with patient. She demonstrates quickDASH which is essentially unchanged from the last time is was updated in early June. Her R shoulder flexion, ER, and IR is within 10 degrees of the L shoulder however she remains most limited in R shoulder abduction. Her R shoulder flexion and abduction strength are also slightly weaker than the L side and she struggles the most with strength overhead toward end of available range of motion. Performed trigger point dry needling with patient again today to help with pain and R shoulder mobility. Significant trigger point noted in R upper trap. Encouraged pt to continue with her HEP and therapist will continue to progress her strength/ROM. Pt will benefit from PT services to address deficits in strength, mobility, and pain in order to return to full function at home.    Rehab Potential  Good    PT Frequency  2x / week    PT Duration  8 weeks    PT Treatment/Interventions  ADLs/Self Care Home  Management;Aquatic Therapy;Canalith Repostioning;Cryotherapy;Electrical Stimulation;Iontophoresis 13m/ml Dexamethasone;Moist Heat;Traction;Ultrasound;Contrast Bath;DME Instruction;Gait training;Stair training;Functional mobility training;Therapeutic activities;Therapeutic exercise;Balance training;Neuromuscular re-education;Patient/family education;Manual techniques;Passive range of motion;Dry needling;Splinting;Taping;Vestibular    PT Next Visit Plan  Progress AAROM/AROM/strengthening of R shoulder as tolerated without pain    PT Home Exercise Plan  Cervical retractions, Scapular retractions, R upper trap stretch, supine canes for AAROM flexion with hands together progressing to AROM supine flexion, AROM sidelying shoulder abduction, AROM sidelying shoulder ER, seated pulleys for flexion and scaption ([id number])    Consulted and Agree with Plan of Care  Patient       Patient will benefit from skilled therapeutic intervention in order to improve the following deficits and impairments:  Decreased strength, Decreased range of motion, Impaired UE functional use, Pain, Impaired vision/preception  Visit Diagnosis: 1. Acute pain of right shoulder   2. Stiffness of right shoulder, not elsewhere classified   3. Muscle weakness (generalized)        Problem List Patient Active Problem List   Diagnosis Date Noted  . Chronic diarrhea 01/14/2019  . Malignant melanoma ([place] 11/23/2016  . Urge incontinence 11/23/2016  .  Atypical mole 09/22/2016  . Slurring of speech 05/12/2016  . Traumatic arthritis elbow 05/04/2015  . Left foot pain 12/31/2014  . Severe sprain of left ankle 12/31/2014  . Osteopenia 09/15/2014  . History of TIA (transient ischemic attack) 05/19/2014  . Fracture of coronoid process of ulna, left, closed 04/25/2014  . Ingrown toenail 01/15/2014  . Obesity (BMI 30-39.9) 10/08/2013  . OA (osteoarthritis) of knee 07/07/2013  . Chronic low back pain 05/09/2013  . Obstructive sleep  apnea 05/09/2013  . GERD (gastroesophageal reflux disease) 05/09/2013  . Hiatal hernia 05/09/2013  . Fibromyalgia syndrome 05/09/2013  . Depression, major, in partial remission ([place]) 05/09/2013  . RLS (restless legs syndrome) 05/09/2013  . Diabetes mellitus with no complication ([place]) 55/00/1642  . High cholesterol 05/09/2013  . 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 Family history of colon cancer 05/09/2013  . Allergic rhinitis 05/09/2013  . Lateral meniscal tear 09/04/2012  . CAD (coronary artery disease) 09/27/2011  . HTN (hypertension) 09/27/2011  . Paroxysmal A-fib ([place]) 09/27/2011   Phillips Grout PT, DPT, GCS  Jeidi Gilles 05/22/2019, 4:22 PM  [address] MAIN [facility] SERVICES [address] [place], [place], [address] Phone: [phone]   Fax:  [phone]  Name: Morgan [name] MRN: [id number] Date of Birth: 1941-02-16

## 2019-05-26 ENCOUNTER — Other Ambulatory Visit: Payer: Self-pay | Admitting: Gastroenterology

## 2019-05-27 ENCOUNTER — Ambulatory Visit: Payer: Medicare Other

## 2019-05-27 ENCOUNTER — Other Ambulatory Visit: Payer: Self-pay

## 2019-05-27 DIAGNOSIS — M542 Cervicalgia: Secondary | ICD-10-CM | POA: Diagnosis not present

## 2019-05-27 DIAGNOSIS — M6281 Muscle weakness (generalized): Secondary | ICD-10-CM

## 2019-05-27 DIAGNOSIS — M25611 Stiffness of right shoulder, not elsewhere classified: Secondary | ICD-10-CM

## 2019-05-27 DIAGNOSIS — M25511 Pain in right shoulder: Secondary | ICD-10-CM

## 2019-05-27 NOTE — Therapy (Addendum)
[place] MAIN [facility] SERVICES [address] [place], [place], [address] Phone: [phone]   Fax:  [phone]  Physical Therapy Treatment  Patient Details  Name: Morgan [name] MRN: [id number] Date of Birth: 06-Jul-1941 Referring Provider (PT): Dr. Mayer Camel   Encounter Date: 05/27/2019  PT End of Session - 05/27/19 1013    Visit Number  39    Number of Visits  65    Date for PT Re-Evaluation  07/15/19    Authorization Type  6/4 goals updated, updated again today 05/22/19;    PT Start Time  0915    PT Stop Time  1000    PT Time Calculation (min)  45 min    Activity Tolerance  Patient tolerated treatment well    Behavior During Therapy  [facility] for tasks assessed/performed       Past Medical History:  Diagnosis Date  . Angina   . Arthritis   . Atrial fibrillation (HCC)    ASPIRIN FOR BLOOD THINNER  . Atypical mole 09/22/2016  . Bulging discs    lumbar   . Cancer ([place])    melanoma  . Complication of anesthesia    pt states has choking sensation with ET tube   . Coronary artery disease   . Depression   . Diabetes mellitus    diet controlled/on meds  . Fibromyalgia   . GERD (gastroesophageal reflux disease)   . H/O hiatal hernia   . Headache(784.0)    "recurring"  . High cholesterol   . Hyperlipidemia   . Hypertension   . 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 Migraines    "til ~ 1980"  . PONV (postoperative nausea and vomiting)   . Restless leg syndrome   . Sciatic nerve pain    "from pinched nerve"  . Sleep apnea    uses CPAP  . Weakness of right side of body    "I've had PT for it; they don't know what it's from".  CORTISONE INJECTION INTO BACK 08/30/12    Past Surgical History:  Procedure Laterality Date  . [place] STUDY N/A 12/29/2015   Procedure: [place] STUDY;  Surgeon: Manus Gunning, MD;  Location: [facility];  Service: Gastroenterology;  Laterality: N/A;  . CARPAL TUNNEL RELEASE Left 07/02/2015   Procedure:  CARPAL TUNNEL RELEASE;  Surgeon: Frederik Pear, MD;  Location: [place];  Service: Orthopedics;  Laterality: Left;  . CATARACT EXTRACTION, BILATERAL Bilateral    Oct and Nov 2017  . CORONARY ANGIOPLASTY WITH STENT PLACEMENT  06/2011   "1"  . CORONARY ARTERY BYPASS GRAFT  2005   CABG X 2  . DILATION AND CURETTAGE OF UTERUS     "more than once"  . ELBOW ARTHROSCOPY Left 07/02/2015   Procedure: ARTHROSCOPY LEFT ELBOW WITH DEBRIDEMENT AND REMOVAL LOOSE BODY;  Surgeon: Frederik Pear, MD;  Location: [place];  Service: Orthopedics;  Laterality: Left;  . ESOPHAGEAL MANOMETRY N/A 12/29/2015   Procedure: ESOPHAGEAL MANOMETRY (EM);  Surgeon: Manus Gunning, MD;  Location: [facility];  Service: Gastroenterology;  Laterality: N/A;  . FRACTURE SURGERY  ~ 2005   nose  . KNEE ARTHROSCOPY  09/04/2012   Procedure: ARTHROSCOPY KNEE;  Surgeon: Gearlean Alf, MD;  Location: [facility];  Service: Orthopedics;  Laterality: Right;  right knee arthroscopy with medial and lateral meniscus debridement  . 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 MOUTH SURGERY  2004   "  bone replacement; had cadavear bones put in; face was collapsing"  . NASAL SEPTUM SURGERY  ~ 1986  . TOTAL KNEE ARTHROPLASTY Right 07/07/2013   Procedure: RIGHT TOTAL KNEE ARTHROPLASTY;  Surgeon: Gearlean Alf, MD;  Location: [facility];  Service: Orthopedics;  Laterality: Right;    There were no vitals filed for this visit.  Subjective Assessment - 05/27/19 0919    Subjective  Pt. reports feeling well today but experiencing consistent numbness starting from the neck throughout RUE after the soreness post dry needling resolved. Pt. reports feeling sore after the last tx for about 2 days resulting from dry needling. No concerns or questions at this time.    Pertinent History  On 08/03/18 pt was carrying a folding table and fell and right arm was under the handle got stuck between the door and the  table. She landed on her R side. Tried to get up and had to pull at her arm. Pt had immediate pain in her right shoulder. She waited until Monday and went to see Dr. Edilia Bo her PCP. He took radiographs but didn't find any fractures. She continued to have pain so she called Dr. Damita Dunnings office, her orthopedist, and saw him that same week. He took additional radiographs and found a hairline fracture in her humeral shaft and a greater tuberosity fracture per patient report. She states that she is not healing well and has recurrent plain film radiographs. She is still wearing a sling and was encouraged not to perform any AROM of her right shoulder. Since her last visit with Dr. Mayer Camel on 09/20/18 she has had an additional fall and landed on her R shoulder. She reports additional bruising and pain in her R shoulder. She has a follow-up appt scheduled with her orthopedist for 10/15/18. 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 ROS negative for red flags.    Limitations  Lifting    Diagnostic tests  Plain film radiographs with decreased healing per pt report    Patient Stated Goals  Improve ability to use RUE and decrease pain    Currently in Pain?  No/denies        TREATMENT   Therapeutic Exercise UBE 2 min forward/2 min backward for warm-up during history  Ladder walks RUE in flexion and scaption x 10 each; pt reports pain towards end range flexion and scaption  Walk circles with ball RUE (CW CCW, flexion, scaption) x20 each direction   Bilateral half clock with serratus punch on wall (9,10, 11, 12, 1, 2, 3 o'clock position) 2x10 Standing push-ups against elevated plinth table 2x10; needing verbal and tactile cues for positioning and form  Bilateral standing rows, R shoulder extension with green theraband 2x10 each; tactile and verbal cueing needed for form  Standing banded 3-point punch with green theraband x8; pt demonstrated inc difficulty and challenge with scaption and  need to switch to 2-point (diagonal shoulder ext and horizontal abduction) position; verbal tactile cues to decrease compensatory R shoulder elevation and for form Bear hug punch for serratus activation with green theraband x10 L sidelying R shoulder abduction 2# x10 L sidelying R shoulder ER 2# x10; max verbal and tactile cues  L sidelying R shoulder horizontal abduction attempted 2# with great difficulty, switched to manual resistance x10   Pt educated throughout session about proper posture and technique with exercises. Improved exercise technique, movement at target joints, use of target muscles after min to mod verbal cues;   Pt  demonstrates good motivation during session. Pt is capable to complete all exercises as instructed without and increase in pain. Pt requires mod-max verbal cues for technique and form throughout all exercises, particularly ER. Pt tolerated overhead activities well without complaints of increase in pain. Pt continues to lack end range strength in flexion and abduction against gravity. Pt utilizes compensatory strategy of R shouder elevation when fatigued or at end range due to decreased strength. Encouraged pt to continue withher HEP and therapist will continue to progress her strength/ROM.Pt will benefit from PT services to address deficits in strength,mobility, and painin order to return to full function at home.     PT Education - 05/27/19 1028    Education Details  Proper technique and form throughout exercises for proper muscle activation    Person(s) Educated  Patient    Methods  Explanation;Demonstration;Tactile cues;Verbal cues    Comprehension  Returned demonstration;Verbalized understanding;Verbal cues required;Tactile cues required       PT Short Term Goals - 05/22/19 1025      PT SHORT TERM GOAL #1   Title  Pt will be independent with HEP in order to improve strength and decrease pain in order to improve pain-free function at home.    Time  4     Period  Weeks    Status  Achieved        PT Long Term Goals - 05/22/19 1025      PT LONG TERM GOAL #1   Title  Pt will decrease quick DASH score by at least 8% in order to demonstrate clinically significant reduction in disability.    Baseline  09/30/18: 84.1%; 10/30/18: 56.8%; 11/27/18: 45.45%; 12/23/18: 40.9%;     Time  8    Period  Weeks    Status  Achieved      PT LONG TERM GOAL #2   Title  Pt will decrease worst pain as reported on NPRS by at least 3 points in order to demonstrate clinically significant reduction in pain.    Baseline  09/30/18: worst: 9/10; 10/30/18: 6/10;    Time  8    Period  Weeks    Status  Achieved      PT LONG TERM GOAL #3   Title  Pt will improve PROM of R shoulder to within 10 degrees of R shoulder in order to improve functional use of R shoulder for ADLs/IADLs.    Baseline  R/L 129/150 Shoulder flexion, 100/165 Shoulder abduction, 50/93 Shoulder external rotation, 50/70 Shoulder internal rotation; 10/30/18: R/L 140/150 Shoulder flexion, 132/165 Shoulder abduction, 82/93 Shoulder external rotation, 70/70 Shoulder internal rotation; 12/23/18:  R/L 155/150 Shoulder flexion, 134/165 Shoulder abduction, 89/93 Shoulder external rotation, 70/70 Shoulder internal rotation, 01/21/19: R/L 154/150 Shoulder flexion, 136/165 Shoulder abduction, 85/93 Shoulder external rotation, 70/70 Shoulder internal rotation; 03/19/19: Unable to test over telemedicine 6/4: R: 128 flexion, 121 abduction, 78 shoulder ER, 70 shoulder IR; 05/22/19: R: 148 flexion (154 L side), 145 abduction (166 L side), 85 shoulder ER, 76 shoulder IR    Time  8    Period  Weeks    Status  Partially Met    Target Date  07/15/19      PT LONG TERM GOAL #4   Title  Pt will decrease worst pain as reported on NPRS by at least 3 points (3 or less) in order to demonstrate clinically significant reduction in pain.    Baseline  10/30/18: 6/10; 11/27/18: 4/10; 12/23/18: 5/10 (improved pain frequency); 01/21/19: 5/10  (  pain is less frequent); 03/19/19: 4/10; 6/4: 5/10; 05/22/19: 4/10 (decreased frequency of pain as well);    Time  8    Period  Weeks    Status  Partially Met    Target Date  07/15/19      PT LONG TERM GOAL #5   Title  Pt will decrease quick DASH score by at least 8% in order to demonstrate clinically significant reduction in disability.    Baseline  11/27/18: 45.45%; 12/23/18: 40.9%; 01/21/19: 29.5%, 6/4: 43%; 05/22/19: 43.2%    Time  8    Period  Weeks    Status  Partially Met    Target Date  07/15/19      PT LONG TERM GOAL #6   Title  Pt will improve pain-free R shoulder strength for flexion and abduction so that it is symmetrical with L shoulder in order to return to full function at home    Baseline  12/23/18: R/L 4-/4+ Shoulder flexion, 4/4+ Shoulder abduction, both painful on R side; 01/21/19: R/L 4/4+ Shoulder flexion, 4/4+ Shoulder abduction, both painful on R side; 03/19/19: Unable to test over telemedicine 6/4: 4-/4; 05/22/19: R/L: Flexion: 4/4+ (no pain bilateral) Abduction: 4-/4 (painful on R side, no pain on L side);    Time  8    Period  Weeks    Status  Partially Met    Target Date  07/15/19            Plan - 05/27/19 1027    Clinical Impression Statement  Pt demonstrates good motivation during session. Pt is capable to complete all exercises as instructed without and increase in pain. Pt requires mod-max verbal cues for technique and form throughout all exercises, particularly ER. Pt tolerated overhead activities well without complaints of increase in pain. Pt continues to lack end range strength in flexion and abduction against gravity. Pt utilizes compensatory strategy of R shouder elevation when fatigued or at end range due to decreased strength. Encouraged pt to continue with her HEP and therapist will continue to progress her strength/ROM. Pt will benefit from PT services to address deficits in strength, mobility, and pain in order to return to full function at home.    Rehab  Potential  Good    PT Frequency  2x / week    PT Duration  8 weeks    PT Treatment/Interventions  ADLs/Self Care Home Management;Aquatic Therapy;Canalith Repostioning;Cryotherapy;Electrical Stimulation;Iontophoresis '4mg'$ /ml Dexamethasone;Moist Heat;Traction;Ultrasound;Contrast Bath;DME Instruction;Gait training;Stair training;Functional mobility training;Therapeutic activities;Therapeutic exercise;Balance training;Neuromuscular re-education;Patient/family education;Manual techniques;Passive range of motion;Dry needling;Splinting;Taping;Vestibular    PT Next Visit Plan  Progress AAROM/AROM/strengthening of R shoulder as tolerated without pain    PT Home Exercise Plan  Cervical retractions, Scapular retractions, R upper trap stretch, supine canes for AAROM flexion with hands together progressing to AROM supine flexion, AROM sidelying shoulder abduction, AROM sidelying shoulder ER, seated pulleys for flexion and scaption ([id number])    Consulted and Agree with Plan of Care  Patient       Patient will benefit from skilled therapeutic intervention in order to improve the following deficits and impairments:  Decreased strength, Decreased range of motion, Impaired UE functional use, Pain, Impaired vision/preception  Visit Diagnosis: 1. Acute pain of right shoulder   2. Stiffness of right shoulder, not elsewhere classified   3. Muscle weakness (generalized)        Problem List Patient Active Problem List   Diagnosis Date Noted  . Chronic diarrhea 01/14/2019  . Malignant melanoma ([place]) 11/23/2016  . Urge  incontinence 11/23/2016  . Atypical mole 09/22/2016  . Slurring of speech 05/12/2016  . Traumatic arthritis elbow 05/04/2015  . Left foot pain 12/31/2014  . Severe sprain of left ankle 12/31/2014  . Osteopenia 09/15/2014  . History of TIA (transient ischemic attack) 05/19/2014  . Fracture of coronoid process of ulna, left, closed 04/25/2014  . Ingrown toenail 01/15/2014  . Obesity (BMI  30-39.9) 10/08/2013  . OA (osteoarthritis) of knee 07/07/2013  . Chronic low back pain 05/09/2013  . Obstructive sleep apnea 05/09/2013  . GERD (gastroesophageal reflux disease) 05/09/2013  . Hiatal hernia 05/09/2013  . Fibromyalgia syndrome 05/09/2013  . Depression, major, in partial remission ([place]) 05/09/2013  . RLS (restless legs syndrome) 05/09/2013  . Diabetes mellitus with no complication ([place]) 98/26/4158  . High cholesterol 05/09/2013  . 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 Family history of colon cancer 05/09/2013  . Allergic rhinitis 05/09/2013  . Lateral meniscal tear 09/04/2012  . CAD (coronary artery disease) 09/27/2011  . HTN (hypertension) 09/27/2011  . Paroxysmal A-fib ([place]) 09/27/2011    This entire session was performed under direct supervision and direction of a licensed therapist/therapist assistant . I have personally read, edited and approve of the note as written.    Elmyra Ricks Jerrod Damiano SPT Phillips Grout PT, DPT, GCS  Huprich,Jason 05/27/2019, 3:23 PM  [place] MAIN [facility] SERVICES [address] [place], [place], [address] Phone: [phone]   Fax:  [phone]  Name: Azarya Oconnell MRN: [id number] Date of Birth: Mar 08, 1941

## 2019-05-29 ENCOUNTER — Ambulatory Visit: Payer: Medicare Other

## 2019-05-29 ENCOUNTER — Other Ambulatory Visit: Payer: Self-pay

## 2019-05-29 DIAGNOSIS — M25611 Stiffness of right shoulder, not elsewhere classified: Secondary | ICD-10-CM

## 2019-05-29 DIAGNOSIS — M542 Cervicalgia: Secondary | ICD-10-CM | POA: Diagnosis not present

## 2019-05-29 DIAGNOSIS — M25511 Pain in right shoulder: Secondary | ICD-10-CM

## 2019-05-29 DIAGNOSIS — M6281 Muscle weakness (generalized): Secondary | ICD-10-CM

## 2019-05-29 NOTE — Therapy (Signed)
[place] MAIN [facility] SERVICES [address] [place], [place], [address] Phone: [phone]   Fax:  [phone]  Physical Therapy Progress Note   Dates of reporting period 04/21/19    to   05/29/19  Patient Details  Name: Morgan [name] MRN: [id number] Date of Birth: [birth year] Referring Provider (PT): Dr. Mayer Camel   Encounter Date: 05/29/2019  PT End of Session - 05/29/19 1023    Visit Number  40    Number of Visits  65    Date for PT Re-Evaluation  07/15/19    Authorization Type  goals updated 05/22/19;    PT Start Time  1015    PT Stop Time  1100    PT Time Calculation (min)  45 min    Activity Tolerance  Patient tolerated treatment well    Behavior During Therapy  [facility] for tasks assessed/performed       Past Medical History:  Diagnosis Date  . Angina   . Arthritis   . Atrial fibrillation (HCC)    ASPIRIN FOR BLOOD THINNER  . Atypical mole 09/22/2016  . Bulging discs    lumbar   . Cancer ([place])    melanoma  . Complication of anesthesia    pt states has choking sensation with ET tube   . Coronary artery disease   . Depression   . Diabetes mellitus    diet controlled/on meds  . Fibromyalgia   . GERD (gastroesophageal reflux disease)   . H/O hiatal hernia   . Headache(784.0)    "recurring"  . High cholesterol   . Hyperlipidemia   . Hypertension   . 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 Migraines    "til ~ 1980"  . PONV (postoperative nausea and vomiting)   . Restless leg syndrome   . Sciatic nerve pain    "from pinched nerve"  . Sleep apnea    uses CPAP  . Weakness of right side of body    "I've had PT for it; they don't know what it's from".  CORTISONE INJECTION INTO BACK 08/30/12    Past Surgical History:  Procedure Laterality Date  . [place] STUDY N/A 12/29/2015   Procedure: [place] STUDY;  Surgeon: Manus Gunning, MD;  Location: [facility];  Service: Gastroenterology;  Laterality: N/A;  . CARPAL TUNNEL RELEASE  Left 07/02/2015   Procedure: CARPAL TUNNEL RELEASE;  Surgeon: Frederik Pear, MD;  Location: [place];  Service: Orthopedics;  Laterality: Left;  . CATARACT EXTRACTION, BILATERAL Bilateral    Oct and Nov 2017  . CORONARY ANGIOPLASTY WITH STENT PLACEMENT  06/2011   "1"  . CORONARY ARTERY BYPASS GRAFT  2005   CABG X 2  . DILATION AND CURETTAGE OF UTERUS     "more than once"  . ELBOW ARTHROSCOPY Left 07/02/2015   Procedure: ARTHROSCOPY LEFT ELBOW WITH DEBRIDEMENT AND REMOVAL LOOSE BODY;  Surgeon: Frederik Pear, MD;  Location: [place];  Service: Orthopedics;  Laterality: Left;  . ESOPHAGEAL MANOMETRY N/A 12/29/2015   Procedure: ESOPHAGEAL MANOMETRY (EM);  Surgeon: Manus Gunning, MD;  Location: [facility];  Service: Gastroenterology;  Laterality: N/A;  . FRACTURE SURGERY  ~ 2005   nose  . KNEE ARTHROSCOPY  09/04/2012   Procedure: ARTHROSCOPY KNEE;  Surgeon: Gearlean Alf, MD;  Location: [facility];  Service: Orthopedics;  Laterality: Right;  right knee arthroscopy with medial and lateral meniscus debridement  . MELANOMA EXCISION Right 10/13/2016   right  side of neck  . MOUTH SURGERY  2004   "bone replacement; had cadavear bones put in; face was collapsing"  . NASAL SEPTUM SURGERY  ~ 1986  . TOTAL KNEE ARTHROPLASTY Right 07/07/2013   Procedure: RIGHT TOTAL KNEE ARTHROPLASTY;  Surgeon: Gearlean Alf, MD;  Location: [facility];  Service: Orthopedics;  Laterality: Right;    There were no vitals filed for this visit.  Subjective Assessment - 05/29/19 1020    Subjective  Pt. presents to PT today motivated and doing well. Pt. reports no increase in pain and having some soreness post last PT session but has subsided since then. Pt. reports experiencing soreness in the AM. Pt. reports numbess and tingling from neck through RUE has become more consistent but has not increased in intensity. Pt has an appt with her Dr. Lenord Carbo this afternoon in regards to the numbness and  tingling.    Pertinent History  On 08/03/18 pt was carrying a folding table and fell and right arm was under the handle got stuck between the door and the table. She landed on her R side. Tried to get up and had to pull at her arm. Pt had immediate pain in her right shoulder. She waited until Monday and went to see Dr. Edilia Bo her PCP. He took radiographs but didn't find any fractures. She continued to have pain so she called Dr. Damita Dunnings office, her orthopedist, and saw him that same week. He took additional radiographs and found a hairline fracture in her humeral shaft and a greater tuberosity fracture per patient report. She states that she is not healing well and has recurrent plain film radiographs. She is still wearing a sling and was encouraged not to perform any AROM of her right shoulder. Since her last visit with Dr. Mayer Camel on 09/20/18 she has had an additional fall and landed on her R shoulder. She reports additional bruising and pain in her R shoulder. She has a follow-up appt scheduled with her orthopedist for 10/15/18. 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 ROS negative for red flags.    Limitations  Lifting    Diagnostic tests  Plain film radiographs with decreased healing per pt report    Patient Stated Goals  Improve ability to use RUE and decrease pain    Currently in Pain?  Yes    Pain Score  3     Pain Location  Shoulder    Pain Orientation  Right    Pain Descriptors / Indicators  Aching;Sore          TREATMENT  Therapeutic Exercise UBE 2 min forward/2 min backward for warm-up during history Ladder walks RUE in flexion and scaption x10 each; pt reports pain towards end range flexion and scaption Walk circles with ball RUE (CW CCW, flexion, scaption) x20 each direction   Bilateral half clock with serratus punch on wall (9,10, 11, 12, 1, 2, 3 o'clock position) x10 Wall slides into flexion with lower trap lift off x10; attempted with green theraband  but was too difficult, without theraband in mod verbal and tactile cues needed for form Bilateral standing rows, R shoulder extension with green theraband x12 each; tactile and verbal cueing needed for form  L sidelying R shoulder abduction 2# x10 L sidelying R shoulder ER 2# x10; max verbal and tactile cues L sidelying R shoulder horizontal abduction with manual resistance x10 Supine R serratus anterior punch against gravity x10 Supine R shoulder rhythmic stabilization  x30s   Pt educated throughout session about proper posture and technique with exercises. Improved exercise technique, movement at target joints, use of target muscles after min to mod verbal cues;    Pt demonstrates good motivation during session. Pt's quickDASH has remained relatively the same since last updated at the beginning of June 2020. Pt R shoulder abduction remains limited, however, R shoulder flex, ER, and IR has improved demonstrating AROM within 10 degrees of AROM of the L shoulder. She demonstrates weakness RUE > LUE in shoulder flexion and abduction and has the greatest difficulty in overhead positions towards the end of her available ROM. Pt demonstrates difficulty with wall slides into flexion due to lack of range and weakness of lower trap apparent during lift off. Pt performs overhead exercises well however fatigues quickly. Pt continues to need verbal and tactile cues throughout session for proper technique and form. Encouraged pt to continue with her HEP and therapist will continue to progress her strength/ROM. She has recently developed tingling from the R side of her neck down to her R forearm. She has an appointment this afternoon to discuss the issue with her orthopedist Dr. Mayer Camel. Pt will benefit from PT services to address deficits in strength, mobility, and pain in order to return to full function at home.       PT Education - 05/29/19 1022    Education Details  proper technique and form throughout all  exercises    Person(s) Educated  Patient    Methods  Explanation;Demonstration;Verbal cues;Tactile cues    Comprehension  Verbalized understanding;Returned demonstration;Verbal cues required;Tactile cues required       PT Short Term Goals - 05/22/19 1025      PT SHORT TERM GOAL #1   Title  Pt will be independent with HEP in order to improve strength and decrease pain in order to improve pain-free function at home.    Time  4    Period  Weeks    Status  Achieved        PT Long Term Goals - 05/22/19 1025      PT LONG TERM GOAL #1   Title  Pt will decrease quick DASH score by at least 8% in order to demonstrate clinically significant reduction in disability.    Baseline  09/30/18: 84.1%; 10/30/18: 56.8%; 11/27/18: 45.45%; 12/23/18: 40.9%;     Time  8    Period  Weeks    Status  Achieved      PT LONG TERM GOAL #2   Title  Pt will decrease worst pain as reported on NPRS by at least 3 points in order to demonstrate clinically significant reduction in pain.    Baseline  09/30/18: worst: 9/10; 10/30/18: 6/10;    Time  8    Period  Weeks    Status  Achieved      PT LONG TERM GOAL #3   Title  Pt will improve PROM of R shoulder to within 10 degrees of R shoulder in order to improve functional use of R shoulder for ADLs/IADLs.    Baseline  R/L 129/150 Shoulder flexion, 100/165 Shoulder abduction, 50/93 Shoulder external rotation, 50/70 Shoulder internal rotation; 10/30/18: R/L 140/150 Shoulder flexion, 132/165 Shoulder abduction, 82/93 Shoulder external rotation, 70/70 Shoulder internal rotation; 12/23/18:  R/L 155/150 Shoulder flexion, 134/165 Shoulder abduction, 89/93 Shoulder external rotation, 70/70 Shoulder internal rotation, 01/21/19: R/L 154/150 Shoulder flexion, 136/165 Shoulder abduction, 85/93 Shoulder external rotation, 70/70 Shoulder internal rotation; 03/19/19: Unable to test over telemedicine  6/4: R: 128 flexion, 121 abduction, 78 shoulder ER, 70 shoulder IR; 05/22/19: R: 148 flexion  (154 L side), 145 abduction (166 L side), 85 shoulder ER, 76 shoulder IR    Time  8    Period  Weeks    Status  Partially Met    Target Date  07/15/19      PT LONG TERM GOAL #4   Title  Pt will decrease worst pain as reported on NPRS by at least 3 points (3 or less) in order to demonstrate clinically significant reduction in pain.    Baseline  10/30/18: 6/10; 11/27/18: 4/10; 12/23/18: 5/10 (improved pain frequency); 01/21/19: 5/10 (pain is less frequent); 03/19/19: 4/10; 6/4: 5/10; 05/22/19: 4/10 (decreased frequency of pain as well);    Time  8    Period  Weeks    Status  Partially Met    Target Date  07/15/19      PT LONG TERM GOAL #5   Title  Pt will decrease quick DASH score by at least 8% in order to demonstrate clinically significant reduction in disability.    Baseline  11/27/18: 45.45%; 12/23/18: 40.9%; 01/21/19: 29.5%, 6/4: 43%; 05/22/19: 43.2%    Time  8    Period  Weeks    Status  Partially Met    Target Date  07/15/19      PT LONG TERM GOAL #6   Title  Pt will improve pain-free R shoulder strength for flexion and abduction so that it is symmetrical with L shoulder in order to return to full function at home    Baseline  12/23/18: R/L 4-/4+ Shoulder flexion, 4/4+ Shoulder abduction, both painful on R side; 01/21/19: R/L 4/4+ Shoulder flexion, 4/4+ Shoulder abduction, both painful on R side; 03/19/19: Unable to test over telemedicine 6/4: 4-/4; 05/22/19: R/L: Flexion: 4/4+ (no pain bilateral) Abduction: 4-/4 (painful on R side, no pain on L side);    Time  8    Period  Weeks    Status  Partially Met    Target Date  07/15/19            Plan - 05/29/19 1023    Clinical Impression Statement  Pt demonstrates good motivation during session. Pt's quickDASH has remained relatively the same since last updated at the beginning of June 2020. Pt R shoulder abduction remains limited, however, R shoulder flex, ER, and IR has improved demonstrating AROM within 10 degrees of AROM of the L shoulder.  She demonstrates weakness RUE > LUE in shoulder flexion and abduction and has the greatest difficulty in overhead positions towards the end of her available ROM. Pt demonstrates difficulty with wall slides into flexion due to lack of range and weakness of lower trap apparent during lift off. Pt performs overhead exercises well however fatigues quickly. Pt continues to need verbal and tactile cues throughout session for proper technique and form. Encouraged pt to continue with her HEP and therapist will continue to progress her strength/ROM. She has recently developed tingling from the R side of her neck down to her R forearm. She has an appointment this afternoon to discuss the issue with her orthopedist Dr. Mayer Camel. Pt will benefit from PT services to address deficits in strength, mobility, and pain in order to return to full function at home.    Rehab Potential  Good    PT Frequency  2x / week    PT Duration  8 weeks    PT Treatment/Interventions  ADLs/Self Care Home Management;Aquatic  Therapy;Canalith Repostioning;Cryotherapy;Electrical Stimulation;Iontophoresis '4mg'$ /ml Dexamethasone;Moist Heat;Traction;Ultrasound;Contrast Bath;DME Instruction;Gait training;Stair training;Functional mobility training;Therapeutic activities;Therapeutic exercise;Balance training;Neuromuscular re-education;Patient/family education;Manual techniques;Passive range of motion;Dry needling;Splinting;Taping;Vestibular    PT Next Visit Plan  Progress AAROM/AROM/strengthening of R shoulder as tolerated without pain    PT Home Exercise Plan  Cervical retractions, Scapular retractions, R upper trap stretch, supine canes for AAROM flexion with hands together progressing to AROM supine flexion, AROM sidelying shoulder abduction, AROM sidelying shoulder ER, seated pulleys for flexion and scaption ([id number])    Consulted and Agree with Plan of Care  Patient       Patient will benefit from skilled therapeutic intervention in order to  improve the following deficits and impairments:  Decreased strength, Decreased range of motion, Impaired UE functional use, Pain, Impaired vision/preception  Visit Diagnosis: 1. Acute pain of right shoulder   2. Stiffness of right shoulder, not elsewhere classified   3. Muscle weakness (generalized)        Problem List Patient Active Problem List   Diagnosis Date Noted  . Chronic diarrhea 01/14/2019  . Malignant melanoma ([place]) 11/23/2016  . Urge incontinence 11/23/2016  . Atypical mole 09/22/2016  . Slurring of speech 05/12/2016  . Traumatic arthritis elbow 05/04/2015  . Left foot pain 12/31/2014  . Severe sprain of left ankle 12/31/2014  . Osteopenia 09/15/2014  . History of TIA (transient ischemic attack) 05/19/2014  . Fracture of coronoid process of ulna, left, closed 04/25/2014  . Ingrown toenail 01/15/2014  . Obesity (BMI 30-39.9) 10/08/2013  . OA (osteoarthritis) of knee 07/07/2013  . Chronic low back pain 05/09/2013  . Obstructive sleep apnea 05/09/2013  . GERD (gastroesophageal reflux disease) 05/09/2013  . Hiatal hernia 05/09/2013  . Fibromyalgia syndrome 05/09/2013  . Depression, major, in partial remission ([place]) 05/09/2013  . RLS (restless legs syndrome) 05/09/2013  . Diabetes mellitus with no complication ([place]) 60/67/7034  . High cholesterol 05/09/2013  . 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 Family history of colon cancer 05/09/2013  . Allergic rhinitis 05/09/2013  . Lateral meniscal tear 09/04/2012  . CAD (coronary artery disease) 09/27/2011  . HTN (hypertension) 09/27/2011  . Paroxysmal A-fib ([place]) 09/27/2011    This entire session was performed under direct supervision and direction of a licensed therapist/therapist assistant . I have personally read, edited and approve of the note as written.   Elmyra Ricks Armistead Sult SPT  Phillips Grout PT, DPT, GCS  Huprich,Jason 05/30/2019, 1:29 PM  [place] MAIN [facility] SERVICES [address] [place], [place], [address] Phone: [phone]   Fax:  [phone]  Name: Kameisha Malicki MRN: [id number] Date of Birth: 07/30/1941

## 2019-06-03 ENCOUNTER — Other Ambulatory Visit: Payer: Self-pay

## 2019-06-03 ENCOUNTER — Ambulatory Visit: Payer: Medicare Other

## 2019-06-03 ENCOUNTER — Other Ambulatory Visit: Payer: Self-pay | Admitting: Family Medicine

## 2019-06-03 DIAGNOSIS — M542 Cervicalgia: Secondary | ICD-10-CM | POA: Diagnosis not present

## 2019-06-03 DIAGNOSIS — M25611 Stiffness of right shoulder, not elsewhere classified: Secondary | ICD-10-CM

## 2019-06-03 DIAGNOSIS — M25511 Pain in right shoulder: Secondary | ICD-10-CM

## 2019-06-03 DIAGNOSIS — M6281 Muscle weakness (generalized): Secondary | ICD-10-CM

## 2019-06-03 NOTE — Telephone Encounter (Signed)
Patient left a voicemail stating that she had already requested a refill from her pharmacy today on her Clonazepam. Patient stated that she only has two days left and can not go without it. Patient stated that she is requesting a speedy refill on this. Last office visit 02/25/19 Last refill 04/18/19 #60

## 2019-06-03 NOTE — Telephone Encounter (Signed)
RX called in and patient advised.  

## 2019-06-03 NOTE — Telephone Encounter (Signed)
Cannot use electronic prescribing today.. please call in.

## 2019-06-03 NOTE — Therapy (Signed)
[place] MAIN [facility] SERVICES [address] [place], [place], [address] Phone: [phone]   Fax:  [phone]  Physical Therapy Treatment  Patient Details  Name: Morgan [name] MRN: [id number] Date of Birth: May 20, 1941 Referring Provider (PT): Dr. Mayer Camel   Encounter Date: 06/03/2019  PT End of Session - 06/03/19 1150    Visit Number  41    Number of Visits  48    Date for PT Re-Evaluation  07/15/19    Authorization Type  goals updated 05/22/19;    PT Start Time  1103    PT Stop Time  1149    PT Time Calculation (min)  46 min    Activity Tolerance  Patient tolerated treatment well    Behavior During Therapy  [facility] for tasks assessed/performed       Past Medical History:  Diagnosis Date  . Angina   . Arthritis   . Atrial fibrillation (HCC)    ASPIRIN FOR BLOOD THINNER  . Atypical mole 09/22/2016  . Bulging discs    lumbar   . Cancer ([place])    melanoma  . Complication of anesthesia    pt states has choking sensation with ET tube   . Coronary artery disease   . Depression   . Diabetes mellitus    diet controlled/on meds  . Fibromyalgia   . GERD (gastroesophageal reflux disease)   . H/O hiatal hernia   . Headache(784.0)    "recurring"  . High cholesterol   . Hyperlipidemia   . Hypertension   . 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 Migraines    "til ~ 1980"  . PONV (postoperative nausea and vomiting)   . Restless leg syndrome   . Sciatic nerve pain    "from pinched nerve"  . Sleep apnea    uses CPAP  . Weakness of right side of body    "I've had PT for it; they don't know what it's from".  CORTISONE INJECTION INTO BACK 08/30/12    Past Surgical History:  Procedure Laterality Date  . [place] STUDY N/A 12/29/2015   Procedure: [place] STUDY;  Surgeon: Manus Gunning, MD;  Location: [facility];  Service: Gastroenterology;  Laterality: N/A;  . CARPAL TUNNEL RELEASE Left 07/02/2015   Procedure: CARPAL TUNNEL RELEASE;   Surgeon: Frederik Pear, MD;  Location: [place];  Service: Orthopedics;  Laterality: Left;  . CATARACT EXTRACTION, BILATERAL Bilateral    Oct and Nov 2017  . CORONARY ANGIOPLASTY WITH STENT PLACEMENT  06/2011   "1"  . CORONARY ARTERY BYPASS GRAFT  2005   CABG X 2  . DILATION AND CURETTAGE OF UTERUS     "more than once"  . ELBOW ARTHROSCOPY Left 07/02/2015   Procedure: ARTHROSCOPY LEFT ELBOW WITH DEBRIDEMENT AND REMOVAL LOOSE BODY;  Surgeon: Frederik Pear, MD;  Location: [place];  Service: Orthopedics;  Laterality: Left;  . ESOPHAGEAL MANOMETRY N/A 12/29/2015   Procedure: ESOPHAGEAL MANOMETRY (EM);  Surgeon: Manus Gunning, MD;  Location: [facility];  Service: Gastroenterology;  Laterality: N/A;  . FRACTURE SURGERY  ~ 2005   nose  . KNEE ARTHROSCOPY  09/04/2012   Procedure: ARTHROSCOPY KNEE;  Surgeon: Gearlean Alf, MD;  Location: [facility];  Service: Orthopedics;  Laterality: Right;  right knee arthroscopy with medial and lateral meniscus debridement  . 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 MOUTH SURGERY  2004   "bone replacement; had cadavear  bones put in; face was collapsing"  . NASAL SEPTUM SURGERY  ~ 1986  . TOTAL KNEE ARTHROPLASTY Right 07/07/2013   Procedure: RIGHT TOTAL KNEE ARTHROPLASTY;  Surgeon: Gearlean Alf, MD;  Location: [facility];  Service: Orthopedics;  Laterality: Right;    There were no vitals filed for this visit.  Subjective Assessment - 06/03/19 1111    Subjective  Pt. reports doing well. No increase in pain or soreness after last session. She had an appt with Dr. Mayer Camel about the new numbness and tingling from cervical through RUE to the elbow that began recently and had xray imaging that revealed arthritis. She arrived with a new order for PT to address cervical radiculopathy.    Pertinent History  On 08/03/18 pt was carrying a folding table and fell and right arm was under the handle got stuck between the door  and the table. She landed on her R side. Tried to get up and had to pull at her arm. Pt had immediate pain in her right shoulder. She waited until Monday and went to see Dr. Edilia Bo her PCP. He took radiographs but didn't find any fractures. She continued to have pain so she called Dr. Damita Dunnings office, her orthopedist, and saw him that same week. He took additional radiographs and found a hairline fracture in her humeral shaft and a greater tuberosity fracture per patient report. She states that she is not healing well and has recurrent plain film radiographs. She is still wearing a sling and was encouraged not to perform any AROM of her right shoulder. Since her last visit with Dr. Mayer Camel on 09/20/18 she has had an additional fall and landed on her R shoulder. She reports additional bruising and pain in her R shoulder. She has a follow-up appt scheduled with her orthopedist for 10/15/18. 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 ROS negative for red flags.    Limitations  Lifting    Diagnostic tests  Plain film radiographs with decreased healing per pt report    Patient Stated Goals  Improve ability to use RUE and decrease pain    Currently in Pain?  No/denies        TREATMENT    Therapeutic Exercise Wall slides with washcloth RUE in flexion and scaption x20 each; pt reports pain towards end range flexion and scaption Walk circles with ball RUE (CW CCW) x20 each direction; verbal cues required for form  Wall slides into flexion with lower trap lift off x10; requires verbal and tactile cueing for form Bilateral standing rows, R shoulder extension with green theraband x12 each; tactile and verbal cueing needed for form  L sidelying R shoulder abduction 1# x15 L sidelying R shoulder ER 1# x15; max verbal and tactile cues L sidelying R shoulder horizontal abduction with manual resistance #1 x15 Standing push ups against // bars; verbal and tactile cues needed for form   Prone I's, Y's, T's #1 x10 each; manual resistance was used for [employer] Dry Needling (TDN) Education performed with patient regarding potential benefit of TDN. Reviewed precautions and risks with patient. Pt provided verbal consent to treatment. TDN performed toRupper trapwith3,0.3x 50 single needle placements with local twitch response (LTR).Pistoning technique utilized.    Pt educated throughout session about proper posture and technique with exercises. Improved exercise technique, movement at target joints, use of target muscles after min to mod verbal cues;     Pt demonstrates good motivation  during session. She had an appointment this week with Dr. Mayer Camel to address the new numbness and tingling that she has been experiencing intermittently over the past week that goes from cervical throughout RUE to about the elbow. Xray imaging was done which identified arthritis throughout cervical spine and glenohumeral joint space which is suspected to be the cause of the numbness and tingling. She brought in an order to evaluate and treat for cervical radiculopathy which will be assessed at the next session. She reported that the physician will do an MRI in 3 weeks if there is no improvement in symptoms. She demonstrates difficulty with wall slides into flexion due to lack of range and weakness of lower trap apparent during lift off requiring verbal and tactile cues for correct form. Pt performs overhead exercises well however fatigues quickly. She continues to need verbal and tactile cues throughout all exercises for proper technique and form. She demonstrates significant weakness during sidelying and prone exercises and requires max verbal and tactile cueing for maintain correct form throughout. Keeping correct form is difficult for her due to limited range of motion and worsens as she fatigues throughout exercises. Encouraged pt to continue with her HEP and therapist will continue to  progress her strength/ROM. Pt will benefit from PT services to address deficits in strength, mobility, and pain in order to return to full function at home.            PT Education - 06/03/19 1157    Education Details  Correct form throughout all exercises    Person(s) Educated  Patient    Methods  Explanation;Demonstration;Tactile cues;Verbal cues    Comprehension  Verbal cues required;Returned demonstration;Verbalized understanding;Tactile cues required       PT Short Term Goals - 05/22/19 1025      PT SHORT TERM GOAL #1   Title  Pt will be independent with HEP in order to improve strength and decrease pain in order to improve pain-free function at home.    Time  4    Period  Weeks    Status  Achieved        PT Long Term Goals - 05/22/19 1025      PT LONG TERM GOAL #1   Title  Pt will decrease quick DASH score by at least 8% in order to demonstrate clinically significant reduction in disability.    Baseline  09/30/18: 84.1%; 10/30/18: 56.8%; 11/27/18: 45.45%; 12/23/18: 40.9%;     Time  8    Period  Weeks    Status  Achieved      PT LONG TERM GOAL #2   Title  Pt will decrease worst pain as reported on NPRS by at least 3 points in order to demonstrate clinically significant reduction in pain.    Baseline  09/30/18: worst: 9/10; 10/30/18: 6/10;    Time  8    Period  Weeks    Status  Achieved      PT LONG TERM GOAL #3   Title  Pt will improve PROM of R shoulder to within 10 degrees of R shoulder in order to improve functional use of R shoulder for ADLs/IADLs.    Baseline  R/L 129/150 Shoulder flexion, 100/165 Shoulder abduction, 50/93 Shoulder external rotation, 50/70 Shoulder internal rotation; 10/30/18: R/L 140/150 Shoulder flexion, 132/165 Shoulder abduction, 82/93 Shoulder external rotation, 70/70 Shoulder internal rotation; 12/23/18:  R/L 155/150 Shoulder flexion, 134/165 Shoulder abduction, 89/93 Shoulder external rotation, 70/70 Shoulder internal rotation, 01/21/19:  R/L 154/150 Shoulder flexion,  136/165 Shoulder abduction, 85/93 Shoulder external rotation, 70/70 Shoulder internal rotation; 03/19/19: Unable to test over telemedicine 6/4: R: 128 flexion, 121 abduction, 78 shoulder ER, 70 shoulder IR; 05/22/19: R: 148 flexion (154 L side), 145 abduction (166 L side), 85 shoulder ER, 76 shoulder IR    Time  8    Period  Weeks    Status  Partially Met    Target Date  07/15/19      PT LONG TERM GOAL #4   Title  Pt will decrease worst pain as reported on NPRS by at least 3 points (3 or less) in order to demonstrate clinically significant reduction in pain.    Baseline  10/30/18: 6/10; 11/27/18: 4/10; 12/23/18: 5/10 (improved pain frequency); 01/21/19: 5/10 (pain is less frequent); 03/19/19: 4/10; 6/4: 5/10; 05/22/19: 4/10 (decreased frequency of pain as well);    Time  8    Period  Weeks    Status  Partially Met    Target Date  07/15/19      PT LONG TERM GOAL #5   Title  Pt will decrease quick DASH score by at least 8% in order to demonstrate clinically significant reduction in disability.    Baseline  11/27/18: 45.45%; 12/23/18: 40.9%; 01/21/19: 29.5%, 6/4: 43%; 05/22/19: 43.2%    Time  8    Period  Weeks    Status  Partially Met    Target Date  07/15/19      PT LONG TERM GOAL #6   Title  Pt will improve pain-free R shoulder strength for flexion and abduction so that it is symmetrical with L shoulder in order to return to full function at home    Baseline  12/23/18: R/L 4-/4+ Shoulder flexion, 4/4+ Shoulder abduction, both painful on R side; 01/21/19: R/L 4/4+ Shoulder flexion, 4/4+ Shoulder abduction, both painful on R side; 03/19/19: Unable to test over telemedicine 6/4: 4-/4; 05/22/19: R/L: Flexion: 4/4+ (no pain bilateral) Abduction: 4-/4 (painful on R side, no pain on L side);    Time  8    Period  Weeks    Status  Partially Met    Target Date  07/15/19            Plan - 06/03/19 1103    Clinical Impression Statement  Pt demonstrates good motivation during  session. She had an appointment this week with Dr. Mayer Camel to address the new numbness and tingling that she has been experiencing intermittently over the past week that goes from cervical throughout RUE to about the elbow. Xray imaging was done which identified arthritis throughout cervical spine and glenohumeral joint space which is suspected to be the cause of the numbness and tingling. She brought in an order to evaluate and treat for cervical radiculopathy which will be assessed at the next session. She reported that the physician will do an MRI in 3 weeks if there is no improvement in symptoms. She demonstrates difficulty with wall slides into flexion due to lack of range and weakness of lower trap apparent during lift off requiring verbal and tactile cues for correct form. Pt performs overhead exercises well however fatigues quickly. She continues to need verbal and tactile cues throughout all exercises for proper technique and form. She demonstrates significant weakness during sidelying and prone exercises and requires max verbal and tactile cueing for maintain correct form throughout. Keeping correct form is difficult for her due to limited range of motion and worsens as she fatigues throughout exercises. Encouraged pt to continue with  her HEP and therapist will continue to progress her strength/ROM. Pt will benefit from PT services to address deficits in strength, mobility, and pain in order to return to full function at home.    Rehab Potential  Good    PT Frequency  2x / week    PT Duration  8 weeks    PT Treatment/Interventions  ADLs/Self Care Home Management;Aquatic Therapy;Canalith Repostioning;Cryotherapy;Electrical Stimulation;Iontophoresis '4mg'$ /ml Dexamethasone;Moist Heat;Traction;Ultrasound;Contrast Bath;DME Instruction;Gait training;Stair training;Functional mobility training;Therapeutic activities;Therapeutic exercise;Balance training;Neuromuscular re-education;Patient/family education;Manual  techniques;Passive range of motion;Dry needling;Splinting;Taping;Vestibular    PT Next Visit Plan  Progress AAROM/AROM/strengthening of R shoulder as tolerated without pain    PT Home Exercise Plan  Cervical retractions, Scapular retractions, R upper trap stretch, supine canes for AAROM flexion with hands together progressing to AROM supine flexion, AROM sidelying shoulder abduction, AROM sidelying shoulder ER, seated pulleys for flexion and scaption ([id number])    Consulted and Agree with Plan of Care  Patient       Patient will benefit from skilled therapeutic intervention in order to improve the following deficits and impairments:  Decreased strength, Decreased range of motion, Impaired UE functional use, Pain, Impaired vision/preception  Visit Diagnosis: 1. Acute pain of right shoulder   2. Stiffness of right shoulder, not elsewhere classified   3. Muscle weakness (generalized)        Problem List Patient Active Problem List   Diagnosis Date Noted  . Chronic diarrhea 01/14/2019  . Malignant melanoma ([place]) 11/23/2016  . Urge incontinence 11/23/2016  . Atypical mole 09/22/2016  . Slurring of speech 05/12/2016  . Traumatic arthritis elbow 05/04/2015  . Left foot pain 12/31/2014  . Severe sprain of left ankle 12/31/2014  . Osteopenia 09/15/2014  . History of TIA (transient ischemic attack) 05/19/2014  . Fracture of coronoid process of ulna, left, closed 04/25/2014  . Ingrown toenail 01/15/2014  . Obesity (BMI 30-39.9) 10/08/2013  . OA (osteoarthritis) of knee 07/07/2013  . Chronic low back pain 05/09/2013  . Obstructive sleep apnea 05/09/2013  . GERD (gastroesophageal reflux disease) 05/09/2013  . Hiatal hernia 05/09/2013  . Fibromyalgia syndrome 05/09/2013  . Depression, major, in partial remission ([place]) 05/09/2013  . RLS (restless legs syndrome) 05/09/2013  . Diabetes mellitus with no complication ([place]) 11/91/4782  . High cholesterol 05/09/2013  . Idiopathic angioedema  05/09/2013  . Family history of colon cancer 05/09/2013  . Allergic rhinitis 05/09/2013  . Lateral meniscal tear 09/04/2012  . CAD (coronary artery disease) 09/27/2011  . HTN (hypertension) 09/27/2011  . Paroxysmal A-fib ([place]) 09/27/2011    This entire session was performed under direct supervision and direction of a licensed therapist/therapist assistant . I have personally read, edited and approve of the note as written.    Elmyra Ricks Yael Angerer SPT Phillips Grout PT, DPT, GCS  Huprich,Jason 06/04/2019, 9:14 AM  [place] MAIN [facility] SERVICES [address] [place], [place], [address] Phone: [phone]   Fax:  [phone]  Name: Refugia Laneve MRN: [id number] Date of Birth: January 25, 1941

## 2019-06-05 ENCOUNTER — Ambulatory Visit: Payer: Medicare Other

## 2019-06-05 ENCOUNTER — Other Ambulatory Visit: Payer: Self-pay

## 2019-06-05 DIAGNOSIS — M5412 Radiculopathy, cervical region: Secondary | ICD-10-CM

## 2019-06-05 DIAGNOSIS — M542 Cervicalgia: Secondary | ICD-10-CM

## 2019-06-05 NOTE — Therapy (Signed)
[place] MAIN [facility] SERVICES [address] [place], [place], [address] Phone: [phone]   Fax:  [phone]  Physical Therapy Treatment/Re-Evaluation  Patient Details  Name: Morgan [name] MRN: [id number] Date of Birth: [birth year] Referring Provider (PT): Dr. Mayer Camel   Encounter Date: 06/05/2019  PT End of Session - 06/05/19 1111    Visit Number  42    Number of Visits  78    Date for PT Re-Evaluation  07/31/19    Authorization Type  shoulder goals updated 05/22/19, neck goals created 06/05/19    PT Start Time  0850    PT Stop Time  0935    PT Time Calculation (min)  45 min    Activity Tolerance  Patient tolerated treatment well    Behavior During Therapy  [facility] for tasks assessed/performed       Past Medical History:  Diagnosis Date  . Angina   . Arthritis   . Atrial fibrillation (HCC)    ASPIRIN FOR BLOOD THINNER  . Atypical mole 09/22/2016  . Bulging discs    lumbar   . Cancer ([place])    melanoma  . Complication of anesthesia    pt states has choking sensation with ET tube   . Coronary artery disease   . Depression   . Diabetes mellitus    diet controlled/on meds  . Fibromyalgia   . GERD (gastroesophageal reflux disease)   . H/O hiatal hernia   . Headache(784.0)    "recurring"  . High cholesterol   . Hyperlipidemia   . Hypertension   . 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 Migraines    "til ~ 1980"  . PONV (postoperative nausea and vomiting)   . Restless leg syndrome   . Sciatic nerve pain    "from pinched nerve"  . Sleep apnea    uses CPAP  . Weakness of right side of body    "I've had PT for it; they don't know what it's from".  CORTISONE INJECTION INTO BACK 08/30/12    Past Surgical History:  Procedure Laterality Date  . [place] STUDY N/A 12/29/2015   Procedure: [place] STUDY;  Surgeon: Manus Gunning, MD;  Location: [facility];  Service: Gastroenterology;  Laterality: N/A;  . CARPAL TUNNEL RELEASE Left  07/02/2015   Procedure: CARPAL TUNNEL RELEASE;  Surgeon: Frederik Pear, MD;  Location: [facility];  Service: Orthopedics;  Laterality: Left;  . CATARACT EXTRACTION, BILATERAL Bilateral    Oct and Nov 2017  . CORONARY ANGIOPLASTY WITH STENT PLACEMENT  06/2011   "1"  . CORONARY ARTERY BYPASS GRAFT  2005   CABG X 2  . DILATION AND CURETTAGE OF UTERUS     "more than once"  . ELBOW ARTHROSCOPY Left 07/02/2015   Procedure: ARTHROSCOPY LEFT ELBOW WITH DEBRIDEMENT AND REMOVAL LOOSE BODY;  Surgeon: Frederik Pear, MD;  Location: [facility];  Service: Orthopedics;  Laterality: Left;  . ESOPHAGEAL MANOMETRY N/A 12/29/2015   Procedure: ESOPHAGEAL MANOMETRY (EM);  Surgeon: Manus Gunning, MD;  Location: [facility];  Service: Gastroenterology;  Laterality: N/A;  . FRACTURE SURGERY  ~ 2005   nose  . KNEE ARTHROSCOPY  09/04/2012   Procedure: ARTHROSCOPY KNEE;  Surgeon: Gearlean Alf, MD;  Location: [facility];  Service: Orthopedics;  Laterality: Right;  right knee arthroscopy with medial and lateral meniscus debridement  . 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 MOUTH SURGERY  2004   "  bone replacement; had cadavear bones put in; face was collapsing"  . NASAL SEPTUM SURGERY  ~ 1986  . TOTAL KNEE ARTHROPLASTY Right 07/07/2013   Procedure: RIGHT TOTAL KNEE ARTHROPLASTY;  Surgeon: Gearlean Alf, MD;  Location: [facility];  Service: Orthopedics;  Laterality: Right;    There were no vitals filed for this visit.  Subjective Assessment - 06/05/19 1129    Subjective  Pt. reports no increase in pain and having some soreness post last PT session lasting about 1 day. Pt. reports experiencing soreness in the AM. She reports that the RUE numbness and tingling is still present but has decreased in intensity since the last treatment. Last session she brought a new order to assess cervical radiculopathy. Neck assessment will be performed today.    Pertinent History  On  08/03/18 pt was carrying a folding table and fell and right arm was under the handle got stuck between the door and the table. She landed on her R side. Tried to get up and had to pull at her arm. Pt had immediate pain in her right shoulder. She waited until Monday and went to see Dr. Edilia Bo her PCP. He took radiographs but didn't find any fractures. She continued to have pain so she called Dr. Damita Dunnings office, her orthopedist, and saw him that same week. He took additional radiographs and found a hairline fracture in her humeral shaft and a greater tuberosity fracture per patient report. She states that she is not healing well and has recurrent plain film radiographs. She is still wearing a sling and was encouraged not to perform any AROM of her right shoulder. Since her last visit with Dr. Mayer Camel on 09/20/18 she has had an additional fall and landed on her R shoulder. She reports additional bruising and pain in her R shoulder. She has a follow-up appt scheduled with her orthopedist for 10/15/18. 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 ROS negative for red flags. 06/05/19: Pt presents to physical therapy with reports of intermittent RUE numbness and tingling that began about 4 weeks ago. The numbness and tingling begins at the shoulder and extends down into the elbow. On days when it is more intense, it begins in the cervical spine. After about 5 seconds, numbness tingling subsides, and she experiences min hyperalgesia. Pt was told by physician that if numbness and tingling does not improve with conservative therapy after 3 months an MRI will be ordered. She is currently being seen in physical therapy for R shoulder pain due to recovery post greater tubercle fracture.    Limitations  Lifting    Diagnostic tests  Plain film radiographs with decreased healing per pt report    Patient Stated Goals  Improve ability to use RUE and decrease pain        SUBJECTIVE  Chief complaint: Neck  pain Onset: 4 weeks ago  Aggravating factors: non-specific Easing factors: none 24 hour pain behavior: prominent AM and PM, not as noticeable throughout the day  Recent neck trauma: No Prior history of neck injury or pain: No Pain quality: pain quality: aching, numbness and tingling Radiating pain: Yes  Numbness/Tingling: Yes    OBJECTIVE   SENSATION: Grossly intact to light touch bilateral UE as determined by testing dermatomes C2-T2; Pt demonstrated decreased sensation on the right C3, and increased sensation on the right at C4 and T2  Proprioception and hot/cold testing deferred on this date    MUSCULOSKELETAL: Tremor: None Bulk:  Normal Tone: Normal  Posture Pt presents with mild kyphotic posture throughout thoracic spine and mild forward head.    Palpation Pt TTP throughout bilateral upper trapezius and cervical paraspinals most notably.    Strength R/L 4-/5 Shoulder abduction (deltoid/supraspinatus, axillary/suprascapular n, C5) 5/5 Elbow flexion (biceps brachii, brachialis, brachioradialis, musculoskeletal n, C5/6) 5/5 Elbow extension (triceps, radial n, C7) 5/5 Wrist Extension (C6/7) 5/5 Wrist Flexion (C6/7) 5/5 Finger adduction (interossei, ulnar n, T1) Cervical isometrics are strong in all directions;  AROM R/L 22 Cervical Flexion 24 Cervical Extension 23/30 Cervical Lateral Flexion 50/65 Cervical Rotation *Indicates pain, overpressure performed unless otherwise indicated PROM=AROM  Repeated Movements No centralization or peripheralization of symptoms with repeated cervical protraction and retraction. Pt demonstrates limited protraction and retraction range of motion.    Passive Accessory Intervertebral Motion (PAIVM) Pt denies reproduction of neck pain with CPA C2-T1 and UPA bilaterally C2-T1. Generally hypomobile throughout; no reproduction of numbness and tingling throughout RUE  Reflex Testing Biceps (C5/6): R: 2+ L: 2+ Brachioradialis (C5/6):  R: 2+ L: 2+    SPECIAL TESTS Spurlings A (ipsilateral lateral flexion/axial compression): R: Positive L: Negative Spurlings B (ipsilateral lateral flexion/contralateral rotation/axial compression): R: Positive L: Negative Distraction Test: Negative  Hoffman Sign (cervical cord compression): R: Negative L: Negative ULTT Median: R: Positive L: Not done ULTT Ulnar: R: Positive L: Not done ULTT Radial: R: Positive L: Not done      PT Education - 06/05/19 1110    Education Details  updated HEP, technique/form, posture cues    Person(s) Educated  Patient    Methods  Explanation;Demonstration;Tactile cues;Verbal cues;Handout    Comprehension  Returned demonstration;Verbalized understanding       PT Short Term Goals - 05/22/19 1025      PT SHORT TERM GOAL #1   Title  Pt will be independent with HEP in order to improve strength and decrease pain in order to improve pain-free function at home.    Time  4    Period  Weeks    Status  Achieved        PT Long Term Goals - 06/05/19 1117      PT LONG TERM GOAL #1   Title  Pt will decrease quick DASH score to below 30% in order to demonstrate clinically significant reduction in disability.    Baseline  09/30/18: 84.1%; 10/30/18: 56.8%; 11/27/18: 45.45%; 12/23/18: 40.9%; 01/21/19: 29.5%, 6/4: 43%; 05/22/19: 43.2%    Time  8    Period  Weeks    Status  Partially Met    Target Date  07/31/19      PT LONG TERM GOAL #2   Title  Pt will decrease worst shoulder pain as reported on NPRS to below 4/10 in order to demonstrate clinically significant reduction in pain.    Baseline  09/30/18: worst: 9/10; 10/30/18: 6/10; 11/27/18: 4/10; 12/23/18: 5/10 (pain is less frequent); 01/21/19: 5/10 (pain is less frequent); 03/19/19: 4/10; 6/4: 5/10; 05/22/19: 4/10 (decreased frequency of pain as well);    Time  8    Period  Weeks    Status  Partially Met    Target Date  07/31/19      PT LONG TERM GOAL #3   Title  Pt will improve PROM of R shoulder to within 10  degrees of R shoulder in order to improve functional use of R shoulder for ADLs/IADLs.    Baseline  R/L 129/150 Shoulder flexion, 100/165 Shoulder abduction, 50/93 Shoulder external rotation, 50/70 Shoulder internal rotation;  10/30/18: R/L 140/150 Shoulder flexion, 132/165 Shoulder abduction, 82/93 Shoulder external rotation, 70/70 Shoulder internal rotation; 12/23/18:  R/L 155/150 Shoulder flexion, 134/165 Shoulder abduction, 89/93 Shoulder external rotation, 70/70 Shoulder internal rotation, 01/21/19: R/L 154/150 Shoulder flexion, 136/165 Shoulder abduction, 85/93 Shoulder external rotation, 70/70 Shoulder internal rotation; 03/19/19: Unable to test over telemedicine 6/4: R: 128 flexion, 121 abduction, 78 shoulder ER, 70 shoulder IR; 05/22/19: R: 148 flexion (154 L side), 145 abduction (166 L side), 85 shoulder ER, 76 shoulder IR    Time  8    Period  Weeks    Status  Partially Met    Target Date  07/31/19      PT LONG TERM GOAL #4   Title  Pt will improve pain-free R shoulder strength for flexion and abduction so that it is symmetrical with L shoulder in order to return to full function at home    Baseline  12/23/18: R/L 4-/4+ Shoulder flexion, 4/4+ Shoulder abduction, both painful on R side; 01/21/19: R/L 4/4+ Shoulder flexion, 4/4+ Shoulder abduction, both painful on R side; 03/19/19: Unable to test over telemedicine 6/4: 4-/4; 05/22/19: R/L: Flexion: 4/4+ (no pain bilateral) Abduction: 4-/4 (painful on R side, no pain on L side);    Time  8    Period  Weeks    Status  Partially Met    Target Date  07/31/19      PT LONG TERM GOAL #5   Title  Pt will demonstrate decrease in NDI by at least 19% in order to demonstrate clinically significant reduction in disability related to neck injury/pain    Baseline  06/05/19: 36%    Time  8    Period  Weeks    Status  New    Target Date  07/31/19      Additional Long Term Goals   Additional Long Term Goals  --      PT LONG TERM GOAL #6   Title  --    Baseline   --    Time  --    Period  --    Status  --      PT LONG TERM GOAL #7   Title  --    Baseline  --    Time  --    Period  --    Status  --    Target Date  --            Plan - 06/05/19 1115    Clinical Impression Statement  Pt is a pleasant 78 year-old female referred for neck pain. PT examination reveals deficits in cervical AROM and PROM in all directions. She had a positive Spurling's A and B on the right and positive ULTTa on the right. Her NDI is 18/50 indicating 36% disability. She is tender to palpation at bilateral upper trapezius and cervical paraspinals. Pt will benefit from skilled PT services to address deficits and return to pain-free function at home.    Personal Factors and Comorbidities  Age;Comorbidity 2;Past/Current Experience    Comorbidities  afib, CAD, fibromyalgia, DM, depression    Examination-Activity Limitations  Bathing;Carry    Examination-Participation Restrictions  Cleaning;Community Activity    Stability/Clinical Decision Making  Unstable/Unpredictable    Clinical Decision Making  Moderate    Rehab Potential  Good    PT Frequency  2x / week    PT Duration  8 weeks    PT Treatment/Interventions  ADLs/Self Care Home Management;Aquatic Therapy;Canalith Repostioning;Cryotherapy;Electrical Stimulation;Iontophoresis 46m/ml Dexamethasone;Moist Heat;Traction;Ultrasound;Contrast Bath;DME Instruction;Gait  training;Stair training;Functional mobility training;Therapeutic activities;Therapeutic exercise;Balance training;Neuromuscular re-education;Patient/family education;Manual techniques;Passive range of motion;Dry needling;Splinting;Taping;Vestibular;Spinal Manipulations    PT Next Visit Plan  Progress AAROM/AROM/strengthening of R shoulder as tolerated without pain, cervical traction, cervical mobilizations, nerve glides, initate cervical HEP    PT Home Exercise Plan  Cervical retractions, Scapular retractions, R upper trap stretch, supine canes for AAROM flexion  with hands together progressing to AROM supine flexion, AROM sidelying shoulder abduction, AROM sidelying shoulder ER, seated pulleys for flexion and scaption ([id number])    Consulted and Agree with Plan of Care  Patient       Patient will benefit from skilled therapeutic intervention in order to improve the following deficits and impairments:  Decreased strength, Decreased range of motion, Impaired UE functional use, Pain  Visit Diagnosis: 1. Cervicalgia   2. Radiculopathy, cervical region        Problem List Patient Active Problem List   Diagnosis Date Noted  . Chronic diarrhea 01/14/2019  . Malignant melanoma ([place]) 11/23/2016  . Urge incontinence 11/23/2016  . Atypical mole 09/22/2016  . Slurring of speech 05/12/2016  . Traumatic arthritis elbow 05/04/2015  . Left foot pain 12/31/2014  . Severe sprain of left ankle 12/31/2014  . Osteopenia 09/15/2014  . History of TIA (transient ischemic attack) 05/19/2014  . Fracture of coronoid process of ulna, left, closed 04/25/2014  . Ingrown toenail 01/15/2014  . Obesity (BMI 30-39.9) 10/08/2013  . OA (osteoarthritis) of knee 07/07/2013  . Chronic low back pain 05/09/2013  . Obstructive sleep apnea 05/09/2013  . GERD (gastroesophageal reflux disease) 05/09/2013  . Hiatal hernia 05/09/2013  . Fibromyalgia syndrome 05/09/2013  . Depression, major, in partial remission ([place]) 05/09/2013  . RLS (restless legs syndrome) 05/09/2013  . Diabetes mellitus with no complication ([place]) 03/49/1791  . High cholesterol 05/09/2013  . 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 Family history of colon cancer 05/09/2013  . Allergic rhinitis 05/09/2013  . Lateral meniscal tear 09/04/2012  . CAD (coronary artery disease) 09/27/2011  . HTN (hypertension) 09/27/2011  . Paroxysmal A-fib ([place]) 09/27/2011    This entire session was performed under direct supervision and direction of a licensed therapist/therapist assistant . I have personally read, edited and  approve of the note as written.    Elmyra Ricks Ziyon Cedotal SPT Phillips Grout PT, DPT, GCS  Huprich,Jason 06/06/2019, 1:31 PM  [place] MAIN [facility] SERVICES [address] [place], [place], [address] Phone: [phone]   Fax:  [phone]  Name: Anniya Whiters MRN: [id number] Date of Birth: 02/01/1941

## 2019-06-10 ENCOUNTER — Other Ambulatory Visit: Payer: Self-pay

## 2019-06-10 ENCOUNTER — Ambulatory Visit: Payer: Medicare Other

## 2019-06-10 DIAGNOSIS — M5412 Radiculopathy, cervical region: Secondary | ICD-10-CM

## 2019-06-10 DIAGNOSIS — M542 Cervicalgia: Secondary | ICD-10-CM | POA: Diagnosis not present

## 2019-06-10 DIAGNOSIS — M25611 Stiffness of right shoulder, not elsewhere classified: Secondary | ICD-10-CM

## 2019-06-10 DIAGNOSIS — M6281 Muscle weakness (generalized): Secondary | ICD-10-CM

## 2019-06-10 DIAGNOSIS — M25511 Pain in right shoulder: Secondary | ICD-10-CM

## 2019-06-10 NOTE — Therapy (Signed)
[place] MAIN [facility] SERVICES [address] [place], [place], [address] Phone: [phone]   Fax:  [phone]  Physical Therapy Treatment  Patient Details  Name: Morgan [name] MRN: [id number] Date of Birth: Oct 24, 1941 Referring Provider (PT): Dr. Mayer Camel   Encounter Date: 06/10/2019  PT End of Session - 06/10/19 1107    Visit Number  43    Number of Visits  81    Date for PT Re-Evaluation  07/31/19    Authorization Type  shoulder goals updated 05/22/19, neck goals created 06/05/19    PT Start Time  1015    PT Stop Time  1108    PT Time Calculation (min)  53 min    Activity Tolerance  Patient tolerated treatment well    Behavior During Therapy  [facility] for tasks assessed/performed       Past Medical History:  Diagnosis Date  . Angina   . Arthritis   . Atrial fibrillation (HCC)    ASPIRIN FOR BLOOD THINNER  . Atypical mole 09/22/2016  . Bulging discs    lumbar   . Cancer ([place])    melanoma  . Complication of anesthesia    pt states has choking sensation with ET tube   . Coronary artery disease   . Depression   . Diabetes mellitus    diet controlled/on meds  . Fibromyalgia   . GERD (gastroesophageal reflux disease)   . H/O hiatal hernia   . Headache(784.0)    "recurring"  . High cholesterol   . Hyperlipidemia   . Hypertension   . 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 Migraines    "til ~ 1980"  . PONV (postoperative nausea and vomiting)   . Restless leg syndrome   . Sciatic nerve pain    "from pinched nerve"  . Sleep apnea    uses CPAP  . Weakness of right side of body    "I've had PT for it; they don't know what it's from".  CORTISONE INJECTION INTO BACK 08/30/12    Past Surgical History:  Procedure Laterality Date  . [place] STUDY N/A 12/29/2015   Procedure: [place] STUDY;  Surgeon: Manus Gunning, MD;  Location: [facility];  Service: Gastroenterology;  Laterality: N/A;  . CARPAL TUNNEL RELEASE Left 07/02/2015    Procedure: CARPAL TUNNEL RELEASE;  Surgeon: Frederik Pear, MD;  Location: [facility];  Service: Orthopedics;  Laterality: Left;  . CATARACT EXTRACTION, BILATERAL Bilateral    Oct and Nov 2017  . CORONARY ANGIOPLASTY WITH STENT PLACEMENT  06/2011   "1"  . CORONARY ARTERY BYPASS GRAFT  2005   CABG X 2  . DILATION AND CURETTAGE OF UTERUS     "more than once"  . ELBOW ARTHROSCOPY Left 07/02/2015   Procedure: ARTHROSCOPY LEFT ELBOW WITH DEBRIDEMENT AND REMOVAL LOOSE BODY;  Surgeon: Frederik Pear, MD;  Location: [place];  Service: Orthopedics;  Laterality: Left;  . ESOPHAGEAL MANOMETRY N/A 12/29/2015   Procedure: ESOPHAGEAL MANOMETRY (EM);  Surgeon: Manus Gunning, MD;  Location: [facility];  Service: Gastroenterology;  Laterality: N/A;  . FRACTURE SURGERY  ~ 2005   nose  . KNEE ARTHROSCOPY  09/04/2012   Procedure: ARTHROSCOPY KNEE;  Surgeon: Gearlean Alf, MD;  Location: [facility];  Service: Orthopedics;  Laterality: Right;  right knee arthroscopy with medial and lateral meniscus debridement  . 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 MOUTH SURGERY  2004   "  bone replacement; had cadavear bones put in; face was collapsing"  . NASAL SEPTUM SURGERY  ~ 1986  . TOTAL KNEE ARTHROPLASTY Right 07/07/2013   Procedure: RIGHT TOTAL KNEE ARTHROPLASTY;  Surgeon: Gearlean Alf, MD;  Location: [facility];  Service: Orthopedics;  Laterality: Right;    There were no vitals filed for this visit.  Subjective Assessment - 06/10/19 1117    Subjective  Pt reports doing well today. She reports a decrease in intensity of R shoulder pain and reports no pain currently. The intensity of numbness and tingling throughout RUE has increased since the last session however remains intermittent.    Pertinent History  On 08/03/18 pt was carrying a folding table and fell and right arm was under the handle got stuck between the door and the table. She landed on her R side. Tried  to get up and had to pull at her arm. Pt had immediate pain in her right shoulder. She waited until Monday and went to see Dr. Edilia Bo her PCP. He took radiographs but didn't find any fractures. She continued to have pain so she called Dr. Damita Dunnings office, her orthopedist, and saw him that same week. He took additional radiographs and found a hairline fracture in her humeral shaft and a greater tuberosity fracture per patient report. She states that she is not healing well and has recurrent plain film radiographs. She is still wearing a sling and was encouraged not to perform any AROM of her right shoulder. Since her last visit with Dr. Mayer Camel on 09/20/18 she has had an additional fall and landed on her R shoulder. She reports additional bruising and pain in her R shoulder. She has a follow-up appt scheduled with her orthopedist for 10/15/18. 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 ROS negative for red flags. 06/05/19: Pt presents to physical therapy with reports of intermittent RUE numbness and tingling that began about 4 weeks ago. The numbness and tingling begins at the shoulder and extends down into the elbow. On days when it is more intense, it begins in the cervical spine. After about 5 seconds, numbness tingling subsides, and she experiences min hyperalgesia. Pt was told by physician that if numbness and tingling does not improve with conservative therapy after 3 months an MRI will be ordered. She is currently being seen in physical therapy for R shoulder pain due to recovery post greater tubercle fracture.    Limitations  Lifting    Diagnostic tests  Plain film radiographs with decreased healing per pt report    Patient Stated Goals  Improve ability to use RUE and decrease pain    Currently in Pain?  No/denies        TREATMENT     Therapeutic Exercise UBE 2 min forward/2 min backward for warm-up during history UE Ranger RUE in flexion and scaption x20 each; no c/o of  inc in pain  UE Ranger RUE CW/CCW circles x10 each  Bilateral standing rows, R shoulder extension with green theraband x15 each; tactile and verbal cueing needed for form  Standing RUE ER with green theraband x15 each; tactile and verbal cueing needed for form  L sidelying R shoulder abduction 1# x15 L sidelying R shoulder ER 1# x12; max verbal and tactile cues L sidelying R shoulder horizontal abduction with manual resistance 1# x12 Supine cervical retraction x10; mod verbal and tactile cueing needed for correct form (added to HEP) Prone I's, Y's, T's with 1# x10  each   Manual Therapy Manual cervical traction    Gentle grade I PA CPAs to C2-T10 x30s bouts; pt did tolerated but reported tenderness and discomfort throughout; hypomobility noted throughout   Trigger Point Dry Needling (TDN) Education performed with patient regarding potential benefit of TDN. Reviewed precautions and risks with patient. Pt provided verbal consent to treatment.TDN performed toRupper trapwith2,0.3x 50 single needle placements with local twitch response (LTR) and 1, 0.3 x 50 single needle placement to R suboccipital with LTR.Pistoning technique utilized.   Pt educated throughout session about proper posture and technique with exercises. Improved exercise technique, movement at target joints, use of target muscles after min to mod verbal cues. Pt educated on new HEP exercises of supine cervical retraction and seated scapular retraction for posture.      Pt demonstrates good motivation during session. Pt performs overhead exercises well however fatigues quickly. She continues to need verbal and tactile cues throughout all exercises for proper technique and form. She demonstrates significant weakness during sidelying and prone exercises and requires mod verbal and tactile cueing for maintain correct form throughout. Keeping correct form is difficult for her due to limited range of motion and worsens as she  fatigues throughout exercises. She tolerates manual cervical traction well with no reproduction of symptoms. She experiences RUE numbness and tingling intermittently throughout session but is resolved within seconds with positional change. Gentle grade I mobilizations throughout cervical and thoracic spine are tolerable but pt reports discomfort; hypomobility noted throughout. Encouraged pt to continue with her HEP and therapist will continue to progress her strength/ROM. Pt will benefit from PT services to address deficits in strength, mobility, and pain in order to return to full function at home.        PT Education - 06/10/19 1118    Education Details  technique/form througout session, cervical retraction & scapular retraction for posture new HEP    Person(s) Educated  Patient    Methods  Explanation;Demonstration;Tactile cues;Verbal cues    Comprehension  Verbal cues required;Tactile cues required;Returned demonstration;Verbalized understanding       PT Short Term Goals - 05/22/19 1025      PT SHORT TERM GOAL #1   Title  Pt will be independent with HEP in order to improve strength and decrease pain in order to improve pain-free function at home.    Time  4    Period  Weeks    Status  Achieved        PT Long Term Goals - 06/05/19 1117      PT LONG TERM GOAL #1   Title  Pt will decrease quick DASH score to below 30% in order to demonstrate clinically significant reduction in disability.    Baseline  09/30/18: 84.1%; 10/30/18: 56.8%; 11/27/18: 45.45%; 12/23/18: 40.9%; 01/21/19: 29.5%, 6/4: 43%; 05/22/19: 43.2%    Time  8    Period  Weeks    Status  Partially Met    Target Date  07/31/19      PT LONG TERM GOAL #2   Title  Pt will decrease worst shoulder pain as reported on NPRS to below 4/10 in order to demonstrate clinically significant reduction in pain.    Baseline  09/30/18: worst: 9/10; 10/30/18: 6/10; 11/27/18: 4/10; 12/23/18: 5/10 (pain is less frequent); 01/21/19: 5/10 (pain  is less frequent); 03/19/19: 4/10; 6/4: 5/10; 05/22/19: 4/10 (decreased frequency of pain as well);    Time  8    Period  Weeks    Status  Partially  Met    Target Date  07/31/19      PT LONG TERM GOAL #3   Title  Pt will improve PROM of R shoulder to within 10 degrees of R shoulder in order to improve functional use of R shoulder for ADLs/IADLs.    Baseline  R/L 129/150 Shoulder flexion, 100/165 Shoulder abduction, 50/93 Shoulder external rotation, 50/70 Shoulder internal rotation; 10/30/18: R/L 140/150 Shoulder flexion, 132/165 Shoulder abduction, 82/93 Shoulder external rotation, 70/70 Shoulder internal rotation; 12/23/18:  R/L 155/150 Shoulder flexion, 134/165 Shoulder abduction, 89/93 Shoulder external rotation, 70/70 Shoulder internal rotation, 01/21/19: R/L 154/150 Shoulder flexion, 136/165 Shoulder abduction, 85/93 Shoulder external rotation, 70/70 Shoulder internal rotation; 03/19/19: Unable to test over telemedicine 6/4: R: 128 flexion, 121 abduction, 78 shoulder ER, 70 shoulder IR; 05/22/19: R: 148 flexion (154 L side), 145 abduction (166 L side), 85 shoulder ER, 76 shoulder IR    Time  8    Period  Weeks    Status  Partially Met    Target Date  07/31/19      PT LONG TERM GOAL #4   Title  Pt will improve pain-free R shoulder strength for flexion and abduction so that it is symmetrical with L shoulder in order to return to full function at home    Baseline  12/23/18: R/L 4-/4+ Shoulder flexion, 4/4+ Shoulder abduction, both painful on R side; 01/21/19: R/L 4/4+ Shoulder flexion, 4/4+ Shoulder abduction, both painful on R side; 03/19/19: Unable to test over telemedicine 6/4: 4-/4; 05/22/19: R/L: Flexion: 4/4+ (no pain bilateral) Abduction: 4-/4 (painful on R side, no pain on L side);    Time  8    Period  Weeks    Status  Partially Met    Target Date  07/31/19      PT LONG TERM GOAL #5   Title  Pt will demonstrate decrease in NDI by at least 19% in order to demonstrate clinically significant reduction  in disability related to neck injury/pain    Baseline  06/05/19: 36%    Time  8    Period  Weeks    Status  New    Target Date  07/31/19      Additional Long Term Goals   Additional Long Term Goals  --      PT LONG TERM GOAL #6   Title  --    Baseline  --    Time  --    Period  --    Status  --      PT LONG TERM GOAL #7   Title  --    Baseline  --    Time  --    Period  --    Status  --    Target Date  --            Plan - 06/10/19 1105    Clinical Impression Statement  Pt demonstrates good motivation during session. Pt performs overhead exercises well however fatigues quickly. She continues to need verbal and tactile cues throughout all exercises for proper technique and form. She demonstrates significant weakness during sidelying and prone exercises and requires mod verbal and tactile cueing for maintain correct form throughout. Keeping correct form is difficult for her due to limited range of motion and worsens as she fatigues throughout exercises. She tolerates manual cervical traction well with no reproduction of symptoms. She experiences RUE numbness and tingling intermittently throughout session but is resolved within seconds with positional change. Gentle grade I mobilizations  throughout cervical and thoracic spine are tolerable but pt reports discomfort; hypomobility noted throughout. Encouraged pt to continue with her HEP and therapist will continue to progress her strength/ROM. Pt will benefit from PT services to address deficits in strength, mobility, and pain in order to return to full function at home.    Personal Factors and Comorbidities  Age;Comorbidity 2;Past/Current Experience    Comorbidities  afib, CAD, fibromyalgia, DM, depression    Examination-Activity Limitations  Bathing;Carry    Examination-Participation Restrictions  Cleaning;Community Activity    Stability/Clinical Decision Making  Unstable/Unpredictable    Rehab Potential  Good    PT Frequency  2x  / week    PT Duration  8 weeks    PT Treatment/Interventions  ADLs/Self Care Home Management;Aquatic Therapy;Canalith Repostioning;Cryotherapy;Electrical Stimulation;Iontophoresis '4mg'$ /ml Dexamethasone;Moist Heat;Traction;Ultrasound;Contrast Bath;DME Instruction;Gait training;Stair training;Functional mobility training;Therapeutic activities;Therapeutic exercise;Balance training;Neuromuscular re-education;Patient/family education;Manual techniques;Passive range of motion;Dry needling;Splinting;Taping;Vestibular;Spinal Manipulations    PT Next Visit Plan  Progress AAROM/AROM/strengthening of R shoulder as tolerated without pain, cervical traction, cervical mobilizations, nerve glides    PT Home Exercise Plan  Cervical retractions, Scapular retractions, R upper trap stretch, supine canes for AAROM flexion with hands together progressing to AROM supine flexion, AROM sidelying shoulder abduction, AROM sidelying shoulder ER, seated pulleys for flexion and scaption ([id number])    Consulted and Agree with Plan of Care  Patient       Patient will benefit from skilled therapeutic intervention in order to improve the following deficits and impairments:  Decreased strength, Decreased range of motion, Impaired UE functional use, Pain  Visit Diagnosis: 1. Cervicalgia   2. Radiculopathy, cervical region   3. Acute pain of right shoulder   4. Stiffness of right shoulder, not elsewhere classified   5. Muscle weakness (generalized)        Problem List Patient Active Problem List   Diagnosis Date Noted  . Chronic diarrhea 01/14/2019  . Malignant melanoma ([place]) 11/23/2016  . Urge incontinence 11/23/2016  . Atypical mole 09/22/2016  . Slurring of speech 05/12/2016  . Traumatic arthritis elbow 05/04/2015  . Left foot pain 12/31/2014  . Severe sprain of left ankle 12/31/2014  . Osteopenia 09/15/2014  . History of TIA (transient ischemic attack) 05/19/2014  . Fracture of coronoid process of ulna, left,  closed 04/25/2014  . Ingrown toenail 01/15/2014  . Obesity (BMI 30-39.9) 10/08/2013  . OA (osteoarthritis) of knee 07/07/2013  . Chronic low back pain 05/09/2013  . Obstructive sleep apnea 05/09/2013  . GERD (gastroesophageal reflux disease) 05/09/2013  . Hiatal hernia 05/09/2013  . Fibromyalgia syndrome 05/09/2013  . Depression, major, in partial remission ([place]) 05/09/2013  . RLS (restless legs syndrome) 05/09/2013  . Diabetes mellitus with no complication ([place]) 37/48/2707  . High cholesterol 05/09/2013  . 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 Family history of colon cancer 05/09/2013  . Allergic rhinitis 05/09/2013  . Lateral meniscal tear 09/04/2012  . CAD (coronary artery disease) 09/27/2011  . HTN (hypertension) 09/27/2011  . Paroxysmal A-fib ([place]) 09/27/2011   This entire session was performed under direct supervision and direction of a licensed therapist/therapist assistant . I have personally read, edited and approve of the note as written.    Elmyra Ricks Matalyn Nawaz SPT [facility] Grout PT, DPT, GCS  Huprich,Jason 06/10/2019, 1:23 PM  [place] MAIN [facility] SERVICES [address] [place], [place], [address] Phone: [phone]   Fax:  [phone]  Name: Morgan [name] MRN: [id number] Date of Birth: 07-30-1941

## 2019-06-12 ENCOUNTER — Other Ambulatory Visit: Payer: Self-pay

## 2019-06-12 ENCOUNTER — Ambulatory Visit: Payer: Medicare Other

## 2019-06-12 DIAGNOSIS — M542 Cervicalgia: Secondary | ICD-10-CM | POA: Diagnosis not present

## 2019-06-12 DIAGNOSIS — M5412 Radiculopathy, cervical region: Secondary | ICD-10-CM

## 2019-06-12 DIAGNOSIS — M6281 Muscle weakness (generalized): Secondary | ICD-10-CM

## 2019-06-12 DIAGNOSIS — M25511 Pain in right shoulder: Secondary | ICD-10-CM

## 2019-06-12 DIAGNOSIS — M25611 Stiffness of right shoulder, not elsewhere classified: Secondary | ICD-10-CM

## 2019-06-12 NOTE — Therapy (Signed)
[place] MAIN [facility] SERVICES [address] [place], [place], [address] Phone: [phone]   Fax:  [phone]  Physical Therapy Treatment  Patient Details  Name: Morgan [name] MRN: [id number] Date of Birth: Mar 25, 1941 Referring Provider (PT): Dr. Mayer Camel   Encounter Date: 06/12/2019  PT End of Session - 06/12/19 1019    Visit Number  [id number]    Number of Visits  77    Date for PT Re-Evaluation  07/31/19    Authorization Type  shoulder goals updated 05/22/19, neck goals created 06/05/19    PT Start Time  1015    PT Stop Time  1100    PT Time Calculation (min)  45 min    Activity Tolerance  Patient tolerated treatment well    Behavior During Therapy  [facility] for tasks assessed/performed       Past Medical History:  Diagnosis Date  . Angina   . Arthritis   . Atrial fibrillation (HCC)    ASPIRIN FOR BLOOD THINNER  . Atypical mole 09/22/2016  . Bulging discs    lumbar   . Cancer ([place])    melanoma  . Complication of anesthesia    pt states has choking sensation with ET tube   . Coronary artery disease   . Depression   . Diabetes mellitus    diet controlled/on meds  . Fibromyalgia   . GERD (gastroesophageal reflux disease)   . H/O hiatal hernia   . Headache(784.0)    "recurring"  . High cholesterol   . Hyperlipidemia   . Hypertension   . 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 Migraines    "til ~ 1980"  . PONV (postoperative nausea and vomiting)   . Restless leg syndrome   . Sciatic nerve pain    "from pinched nerve"  . Sleep apnea    uses CPAP  . Weakness of right side of body    "I've had PT for it; they don't know what it's from".  CORTISONE INJECTION INTO BACK 08/30/12    Past Surgical History:  Procedure Laterality Date  . [place] STUDY N/A 12/29/2015   Procedure: [place] STUDY;  Surgeon: Manus Gunning, MD;  Location: [facility];  Service: Gastroenterology;  Laterality: N/A;  . CARPAL TUNNEL RELEASE Left 07/02/2015    Procedure: CARPAL TUNNEL RELEASE;  Surgeon: Frederik Pear, MD;  Location: [place];  Service: Orthopedics;  Laterality: Left;  . CATARACT EXTRACTION, BILATERAL Bilateral    Oct and Nov 2017  . CORONARY ANGIOPLASTY WITH STENT PLACEMENT  06/2011   "1"  . CORONARY ARTERY BYPASS GRAFT  2005   CABG X 2  . DILATION AND CURETTAGE OF UTERUS     "more than once"  . ELBOW ARTHROSCOPY Left 07/02/2015   Procedure: ARTHROSCOPY LEFT ELBOW WITH DEBRIDEMENT AND REMOVAL LOOSE BODY;  Surgeon: Frederik Pear, MD;  Location: [place];  Service: Orthopedics;  Laterality: Left;  . ESOPHAGEAL MANOMETRY N/A 12/29/2015   Procedure: ESOPHAGEAL MANOMETRY (EM);  Surgeon: Manus Gunning, MD;  Location: [facility];  Service: Gastroenterology;  Laterality: N/A;  . FRACTURE SURGERY  ~ 2005   nose  . KNEE ARTHROSCOPY  09/04/2012   Procedure: ARTHROSCOPY KNEE;  Surgeon: Gearlean Alf, MD;  Location: [facility];  Service: Orthopedics;  Laterality: Right;  right knee arthroscopy with medial and lateral meniscus debridement  . 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 MOUTH SURGERY  2004   "  bone replacement; had cadavear bones put in; face was collapsing"  . NASAL SEPTUM SURGERY  ~ 1986  . TOTAL KNEE ARTHROPLASTY Right 07/07/2013   Procedure: RIGHT TOTAL KNEE ARTHROPLASTY;  Surgeon: Gearlean Alf, MD;  Location: [facility];  Service: Orthopedics;  Laterality: Right;    There were no vitals filed for this visit.  Subjective Assessment - 06/12/19 1018    Subjective  Pt reports being sore for about 2 days after the last session throughout cervical and RUE. Her numbness and tingling has increased in frequency and now extends below the elbow minimally which is more distal than previously.    Pertinent History  On 08/03/18 pt was carrying a folding table and fell and right arm was under the handle got stuck between the door and the table. She landed on her R side. Tried to get up  and had to pull at her arm. Pt had immediate pain in her right shoulder. She waited until Monday and went to see Dr. Edilia Bo her PCP. He took radiographs but didn't find any fractures. She continued to have pain so she called Dr. Damita Dunnings office, her orthopedist, and saw him that same week. He took additional radiographs and found a hairline fracture in her humeral shaft and a greater tuberosity fracture per patient report. She states that she is not healing well and has recurrent plain film radiographs. She is still wearing a sling and was encouraged not to perform any AROM of her right shoulder. Since her last visit with Dr. Mayer Camel on 09/20/18 she has had an additional fall and landed on her R shoulder. She reports additional bruising and pain in her R shoulder. She has a follow-up appt scheduled with her orthopedist for 10/15/18. 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 ROS negative for red flags. 06/05/19: Pt presents to physical therapy with reports of intermittent RUE numbness and tingling that began about 4 weeks ago. The numbness and tingling begins at the shoulder and extends down into the elbow. On days when it is more intense, it begins in the cervical spine. After about 5 seconds, numbness tingling subsides, and she experiences min hyperalgesia. Pt was told by physician that if numbness and tingling does not improve with conservative therapy after 3 months an MRI will be ordered. She is currently being seen in physical therapy for R shoulder pain due to recovery post greater tubercle fracture.    Limitations  Lifting    Diagnostic tests  Plain film radiographs with decreased healing per pt report    Patient Stated Goals  Improve ability to use RUE and decrease pain    Currently in Pain?  No/denies          TREATMENT     Therapeutic Exercise UBE 2 min forward/2 min backward for warm-up during history Bilateral standing rows, R shoulder extension with green theraband  x15 each; tactile and verbal cueing needed for form  Standing RUE ER with green theraband x12; max tactile and verbal cueing needed for form    Mechanical Cervical Traction  Intermittent, 30 degree pull angle, 15# 30s hold time, 10# 10s rest time x10 minutes total. Pt denies any numbness/tingling at end of traction. *with bolster under knees for comfort   Manual Therapy Supine R scalene stretch to the R with retraction x2; pt c/o increase in N&T down RUE  Supine cervical retraction 3s hold x10 with OP from therapist; max verbal and tactile cues required Gentle  grade I & II first rib mobs 3x30s bouts; pt c/o inc in N&T down RUE  Gentle grade I PA CPAs to C2-T1 x30s, 1 bout/level; pt tolerated but reported tenderness and discomfort throughout; hypomobility noted throughout. No increase in numbness/tingling; Pec minor palpation which is uncomfortable for patient but no increase in N/T in RUE. R median nerve glides 2 x 10, no increase in N/T;   Pt educated throughout session about proper posture and technique with exercises. Improved exercise technique, movement at target joints, use of target muscles after min to mod verbal cues. Pt educated on new HEP exercises of supine cervical retraction and seated scapular retraction for posture.      Pt demonstrates good motivation during session. She presented to PT with c/o increase in frequency of numbness and tingling down the RUE just below the elbow. Mechanical cervical traction was applied today to assess symptom relief. Pt tolerated it well. She is hypomobile throughout cervical spine. She tends to muscle guard heavily during treatment. She experiences RUE numbness and tingling intermittently throughout session but is resolved within seconds with positional change. Gentle grade I mobilizations throughout cervical and thoracic hypomobility. Pt tolerates but reports discomfort throughout. Encouraged pt to continue with her HEP and therapist will  continue to progress her strength/ROM. Pt will benefit from PT services to address deficits in strength, mobility, and pain in order to return to full function at home.         PT Short Term Goals - 05/22/19 1025      PT SHORT TERM GOAL #1   Title  Pt will be independent with HEP in order to improve strength and decrease pain in order to improve pain-free function at home.    Time  4    Period  Weeks    Status  Achieved        PT Long Term Goals - 06/05/19 1117      PT LONG TERM GOAL #1   Title  Pt will decrease quick DASH score to below 30% in order to demonstrate clinically significant reduction in disability.    Baseline  09/30/18: 84.1%; 10/30/18: 56.8%; 11/27/18: 45.45%; 12/23/18: 40.9%; 01/21/19: 29.5%, 6/4: 43%; 05/22/19: 43.2%    Time  8    Period  Weeks    Status  Partially Met    Target Date  07/31/19      PT LONG TERM GOAL #2   Title  Pt will decrease worst shoulder pain as reported on NPRS to below 4/10 in order to demonstrate clinically significant reduction in pain.    Baseline  09/30/18: worst: 9/10; 10/30/18: 6/10; 11/27/18: 4/10; 12/23/18: 5/10 (pain is less frequent); 01/21/19: 5/10 (pain is less frequent); 03/19/19: 4/10; 6/4: 5/10; 05/22/19: 4/10 (decreased frequency of pain as well);    Time  8    Period  Weeks    Status  Partially Met    Target Date  07/31/19      PT LONG TERM GOAL #3   Title  Pt will improve PROM of R shoulder to within 10 degrees of R shoulder in order to improve functional use of R shoulder for ADLs/IADLs.    Baseline  R/L 129/150 Shoulder flexion, 100/165 Shoulder abduction, 50/93 Shoulder external rotation, 50/70 Shoulder internal rotation; 10/30/18: R/L 140/150 Shoulder flexion, 132/165 Shoulder abduction, 82/93 Shoulder external rotation, 70/70 Shoulder internal rotation; 12/23/18:  R/L 155/150 Shoulder flexion, 134/165 Shoulder abduction, 89/93 Shoulder external rotation, 70/70 Shoulder internal rotation, 01/21/19: R/L 154/150 Shoulder flexion,  136/165 Shoulder abduction, 85/93 Shoulder external rotation, 70/70 Shoulder internal rotation; 03/19/19: Unable to test over telemedicine 6/4: R: 128 flexion, 121 abduction, 78 shoulder ER, 70 shoulder IR; 05/22/19: R: 148 flexion (154 L side), 145 abduction (166 L side), 85 shoulder ER, 76 shoulder IR    Time  8    Period  Weeks    Status  Partially Met    Target Date  07/31/19      PT LONG TERM GOAL #4   Title  Pt will improve pain-free R shoulder strength for flexion and abduction so that it is symmetrical with L shoulder in order to return to full function at home    Baseline  12/23/18: R/L 4-/4+ Shoulder flexion, 4/4+ Shoulder abduction, both painful on R side; 01/21/19: R/L 4/4+ Shoulder flexion, 4/4+ Shoulder abduction, both painful on R side; 03/19/19: Unable to test over telemedicine 6/4: 4-/4; 05/22/19: R/L: Flexion: 4/4+ (no pain bilateral) Abduction: 4-/4 (painful on R side, no pain on L side);    Time  8    Period  Weeks    Status  Partially Met    Target Date  07/31/19      PT LONG TERM GOAL #5   Title  Pt will demonstrate decrease in NDI by at least 19% in order to demonstrate clinically significant reduction in disability related to neck injury/pain    Baseline  06/05/19: 36%    Time  8    Period  Weeks    Status  New    Target Date  07/31/19      Additional Long Term Goals   Additional Long Term Goals  --      PT LONG TERM GOAL #6   Title  --    Baseline  --    Time  --    Period  --    Status  --      PT LONG TERM GOAL #7   Title  --    Baseline  --    Time  --    Period  --    Status  --    Target Date  --            Plan - 06/12/19 1020    Clinical Impression Statement  Pt demonstrates good motivation during session. She presented to PT with c/o increase in frequency of numbness and tingling down the RUE just below the elbow. Mechanical cervical traction was applied today to assess symptom relief. Pt tolerated it well. She is hypomobile throughout cervical  spine. She tends to muscle guard heavily during treatment. She experiences RUE numbness and tingling intermittently throughout session but is resolved within seconds with positional change. Gentle grade I mobilizations throughout cervical and thoracic hypomobility. Pt tolerates but reports discomfort throughout. Encouraged pt to continue with her HEP and therapist will continue to progress her strength/ROM. Pt will benefit from PT services to address deficits in strength, mobility, and pain in order to return to full function at home.    Personal Factors and Comorbidities  Age;Comorbidity 2;Past/Current Experience    Comorbidities  afib, CAD, fibromyalgia, DM, depression    Examination-Activity Limitations  Bathing;Carry    Examination-Participation Restrictions  Cleaning;Community Activity    Stability/Clinical Decision Making  Unstable/Unpredictable    Rehab Potential  Good    PT Frequency  2x / week    PT Duration  8 weeks    PT Treatment/Interventions  ADLs/Self Care Home Management;Aquatic Therapy;Canalith Repostioning;Cryotherapy;Electrical Stimulation;Iontophoresis 4m/ml Dexamethasone;Moist Heat;Traction;Ultrasound;Contrast  Bath;DME Instruction;Gait training;Stair training;Functional mobility training;Therapeutic activities;Therapeutic exercise;Balance training;Neuromuscular re-education;Patient/family education;Manual techniques;Passive range of motion;Dry needling;Splinting;Taping;Vestibular;Spinal Manipulations    PT Next Visit Plan  Progress AAROM/AROM/strengthening of R shoulder as tolerated without pain, cervical traction, cervical mobilizations, nerve glides    PT Home Exercise Plan  Cervical retractions, Scapular retractions, R upper trap stretch, supine canes for AAROM flexion with hands together progressing to AROM supine flexion, AROM sidelying shoulder abduction, AROM sidelying shoulder ER, seated pulleys for flexion and scaption ([id number])    Consulted and Agree with Plan of Care   Patient       Patient will benefit from skilled therapeutic intervention in order to improve the following deficits and impairments:  Decreased strength, Decreased range of motion, Impaired UE functional use, Pain  Visit Diagnosis: 1. Cervicalgia   2. Radiculopathy, cervical region   3. Acute pain of right shoulder   4. Stiffness of right shoulder, not elsewhere classified   5. Muscle weakness (generalized)        Problem List Patient Active Problem List   Diagnosis Date Noted  . Chronic diarrhea 01/14/2019  . Malignant melanoma ([place]) 11/23/2016  . Urge incontinence 11/23/2016  . Atypical mole 09/22/2016  . Slurring of speech 05/12/2016  . Traumatic arthritis elbow 05/04/2015  . Left foot pain 12/31/2014  . Severe sprain of left ankle 12/31/2014  . Osteopenia 09/15/2014  . History of TIA (transient ischemic attack) 05/19/2014  . Fracture of coronoid process of ulna, left, closed 04/25/2014  . Ingrown toenail 01/15/2014  . Obesity (BMI 30-39.9) 10/08/2013  . OA (osteoarthritis) of knee 07/07/2013  . Chronic low back pain 05/09/2013  . Obstructive sleep apnea 05/09/2013  . GERD (gastroesophageal reflux disease) 05/09/2013  . Hiatal hernia 05/09/2013  . Fibromyalgia syndrome 05/09/2013  . Depression, major, in partial remission ([place]) 05/09/2013  . RLS (restless legs syndrome) 05/09/2013  . Diabetes mellitus with no complication ([place]) 17/79/3903  . High cholesterol 05/09/2013  . 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 Family history of colon cancer 05/09/2013  . Allergic rhinitis 05/09/2013  . Lateral meniscal tear 09/04/2012  . CAD (coronary artery disease) 09/27/2011  . HTN (hypertension) 09/27/2011  . Paroxysmal A-fib ([place]) 09/27/2011    This entire session was performed under direct supervision and direction of a licensed therapist/therapist assistant . I have personally read, edited and approve of the note as written.   Elmyra Ricks  SPT [facility] PT, DPT,  GCS  Huprich,Jason 06/12/2019, 3:37 PM  [place] MAIN [facility] SERVICES [address] [place], [place], [address] Phone: [phone]   Fax:  [phone]  Name: Morgan [name] MRN: [id number] Date of Birth: Jan 16, 1941

## 2019-06-20 ENCOUNTER — Other Ambulatory Visit: Payer: Self-pay | Admitting: Gastroenterology

## 2019-06-23 ENCOUNTER — Ambulatory Visit (INDEPENDENT_AMBULATORY_CARE_PROVIDER_SITE_OTHER): Payer: Medicare Other | Admitting: Gastroenterology

## 2019-06-23 ENCOUNTER — Encounter: Payer: Self-pay | Admitting: Gastroenterology

## 2019-06-23 ENCOUNTER — Ambulatory Visit: Payer: Medicare Other | Attending: Orthopedic Surgery

## 2019-06-23 ENCOUNTER — Other Ambulatory Visit: Payer: Self-pay

## 2019-06-23 VITALS — BP 160/80 | HR 88 | Temp 98.3°F | Ht 62.25 in | Wt 151.8 lb

## 2019-06-23 DIAGNOSIS — M5412 Radiculopathy, cervical region: Secondary | ICD-10-CM | POA: Diagnosis present

## 2019-06-23 DIAGNOSIS — K219 Gastro-esophageal reflux disease without esophagitis: Secondary | ICD-10-CM | POA: Diagnosis not present

## 2019-06-23 DIAGNOSIS — R159 Full incontinence of feces: Secondary | ICD-10-CM | POA: Diagnosis not present

## 2019-06-23 DIAGNOSIS — R194 Change in bowel habit: Secondary | ICD-10-CM

## 2019-06-23 DIAGNOSIS — M25611 Stiffness of right shoulder, not elsewhere classified: Secondary | ICD-10-CM | POA: Diagnosis present

## 2019-06-23 DIAGNOSIS — M25511 Pain in right shoulder: Secondary | ICD-10-CM

## 2019-06-23 DIAGNOSIS — M542 Cervicalgia: Secondary | ICD-10-CM | POA: Diagnosis not present

## 2019-06-23 DIAGNOSIS — M6281 Muscle weakness (generalized): Secondary | ICD-10-CM | POA: Diagnosis present

## 2019-06-23 NOTE — Therapy (Signed)
[facility] [address] [place], , [address] Phone: [phone]   Fax:  [phone]  Physical Therapy Treatment  Patient Details  Name: Morgan [name] MRN: [id number] Date of Birth: 06/11/1941 Referring Provider (PT): Dr. Rowan   Encounter Date: 06/23/2019  PT End of Session - 06/23/19 0939    Visit Number  45    Number of Visits  81    Date for PT Re-Evaluation  07/31/19    Authorization Type  shoulder goals updated 05/22/19, neck goals created 06/05/19    PT Start Time  0930    PT Stop Time  1015    PT Time Calculation (min)  45 min    Activity Tolerance  Patient tolerated treatment well    Behavior During Therapy  WFL for tasks assessed/performed       Past Medical History:  Diagnosis Date  . Angina   . Arthritis   . Atrial fibrillation (HCC)    ASPIRIN FOR BLOOD THINNER  . Atypical mole 09/22/2016  . Bulging discs    lumbar   . Cancer (HCC)    melanoma  . Complication of anesthesia    pt states has choking sensation with ET tube   . Coronary artery disease   . Depression   . Diabetes mellitus    diet controlled/on meds  . Fibromyalgia   . GERD (gastroesophageal reflux disease)   . H/O hiatal hernia   . Headache(784.0)    "recurring"  . High cholesterol   . Hyperlipidemia   . Hypertension   . 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 Migraines    "til ~ 1980"  . PONV (postoperative nausea and vomiting)   . Restless leg syndrome   . Sciatic nerve pain    "from pinched nerve"  . Sleep apnea    uses CPAP  . Weakness of right side of body    "I've had PT for it; they don't know what it's from".  CORTISONE INJECTION INTO BACK 08/30/12    Past Surgical History:  Procedure Laterality Date  . 24 HOUR PH STUDY N/A 12/29/2015   Procedure: 24 HOUR PH STUDY;  Surgeon: Steven Paul Armbruster, MD;  Location: [facility];  Service: Gastroenterology;  Laterality: N/A;  . CARPAL TUNNEL RELEASE Left 07/02/2015    Procedure: CARPAL TUNNEL RELEASE;  Surgeon: Frank Rowan, MD;  Location: [facility];  Service: Orthopedics;  Laterality: Left;  . CATARACT EXTRACTION, BILATERAL Bilateral    Oct and Nov 2017  . CORONARY ANGIOPLASTY WITH STENT PLACEMENT  06/2011   "1"  . CORONARY ARTERY BYPASS GRAFT  2005   CABG X 2  . DILATION AND CURETTAGE OF UTERUS     "more than once"  . ELBOW ARTHROSCOPY Left 07/02/2015   Procedure: ARTHROSCOPY LEFT ELBOW WITH DEBRIDEMENT AND REMOVAL LOOSE BODY;  Surgeon: Frank Rowan, MD;  Location: [facility];  Service: Orthopedics;  Laterality: Left;  . ESOPHAGEAL MANOMETRY N/A 12/29/2015   Procedure: ESOPHAGEAL MANOMETRY (EM);  Surgeon: Steven Paul Armbruster, MD;  Location: [facility];  Service: Gastroenterology;  Laterality: N/A;  . FRACTURE SURGERY  ~ 2005   nose  . KNEE ARTHROSCOPY  09/04/2012   Procedure: ARTHROSCOPY KNEE;  Surgeon: Frank V Aluisio, MD;  Location: [facility];  Service: Orthopedics;  Laterality: Right;  right knee arthroscopy with medial and lateral meniscus debridement  . 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 MOUTH SURGERY  2004   "  bone replacement; had cadavear bones put in; face was collapsing"  . NASAL SEPTUM SURGERY  ~ 1986  . TOTAL KNEE ARTHROPLASTY Right 07/07/2013   Procedure: RIGHT TOTAL KNEE ARTHROPLASTY;  Surgeon: Gearlean Alf, MD;  Location: [facility];  Service: Orthopedics;  Laterality: Right;    There were no vitals filed for this visit.  Subjective Assessment - 06/23/19 0937    Subjective  Pt reports feeling okay today. She is still experiencing N&T throughout RUE which has continued to worsen. Her doctor's office is in the process of setting up an MRI at this time. Pt reports tolerated last sesion well with no c/o of inc pain or N&T.    Pertinent History  On 08/03/18 pt was carrying a folding table and fell and right arm was under the handle got stuck between the door and the table. She landed on her  R side. Tried to get up and had to pull at her arm. Pt had immediate pain in her right shoulder. She waited until Monday and went to see Dr. Edilia Bo her PCP. He took radiographs but didn't find any fractures. She continued to have pain so she called Dr. Damita Dunnings office, her orthopedist, and saw him that same week. He took additional radiographs and found a hairline fracture in her humeral shaft and a greater tuberosity fracture per patient report. She states that she is not healing well and has recurrent plain film radiographs. She is still wearing a sling and was encouraged not to perform any AROM of her right shoulder. Since her last visit with Dr. Mayer Camel on 09/20/18 she has had an additional fall and landed on her R shoulder. She reports additional bruising and pain in her R shoulder. She has a follow-up appt scheduled with her orthopedist for 10/15/18. 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 ROS negative for red flags. 06/05/19: Pt presents to physical therapy with reports of intermittent RUE numbness and tingling that began about 4 weeks ago. The numbness and tingling begins at the shoulder and extends down into the elbow. On days when it is more intense, it begins in the cervical spine. After about 5 seconds, numbness tingling subsides, and she experiences min hyperalgesia. Pt was told by physician that if numbness and tingling does not improve with conservative therapy after 3 months an MRI will be ordered. She is currently being seen in physical therapy for R shoulder pain due to recovery post greater tubercle fracture.    Limitations  Lifting    Diagnostic tests  Plain film radiographs with decreased healing per pt report    Patient Stated Goals  Improve ability to use RUE and decrease pain    Currently in Pain?  Yes    Pain Score  2     Pain Location  Shoulder    Pain Orientation  Right    Pain Descriptors / Indicators  Aching;Sore;Numbness;Tingling    Pain Type  Surgical  pain    Pain Onset  More than a month ago    Pain Frequency  Intermittent        TREATMENT      Therapeutic Exercise UBE 2.5 min forward/2.5 min backward for warm-up during history Bilateral standing rows, R shoulder extension with green theraband x15 each; tactile and verbal cueing needed for form  Standing RUE ER with yellow theraband x12; max tactile and verbal cueing needed for form  Prone I's T's 1#, Y's with gravity resistance x10 each  Sidelying R shoulder abduction and ER 1# x15 each; mod-max verbal and tactile cues needed for form  Supine cervical retraction 3s hold x10 with OP from therapist; max verbal and tactile cues required    Mechanical Cervical Traction  Intermittent, 30 degree pull angle, 18# 30s hold time, 12# 10s rest time x10 minutes total. Pt denies any numbness/tingling at end of traction. *with bolster under knees for comfort      Pt educated throughout session about proper posture and technique with exercises. Improved exercise technique, movement at target joints, use of target muscles after min to mod verbal cues.      Pt demonstrates good motivation during session. Pt tolerated treatment well today without reported c/o inc pain. She experienced numbness and tingling x1 throughout RUE during positional change however with repeated cervical retractions symptoms resolved within seconds. She continues to require mod-max verbal and tactile cueing throughout theraband and resisted exercises for proper technique and form. She has notable lack of  range with abduction and ER. Continued mechanical cervical traction today as pt tolerated well at last treatment and had no reports of inc in symptom intensity or frequency. Encouraged pt to continue with her HEP and therapist will continue to progress her strength/ROM. Pt will benefit from PT services to address deficits in strength, mobility, and pain in order to return to full function at home.          PT Short  Term Goals - 05/22/19 1025      PT SHORT TERM GOAL #1   Title  Pt will be independent with HEP in order to improve strength and decrease pain in order to improve pain-free function at home.    Time  4    Period  Weeks    Status  Achieved        PT Long Term Goals - 06/05/19 1117      PT LONG TERM GOAL #1   Title  Pt will decrease quick DASH score to below 30% in order to demonstrate clinically significant reduction in disability.    Baseline  09/30/18: 84.1%; 10/30/18: 56.8%; 11/27/18: 45.45%; 12/23/18: 40.9%; 01/21/19: 29.5%, 6/4: 43%; 05/22/19: 43.2%    Time  8    Period  Weeks    Status  Partially Met    Target Date  07/31/19      PT LONG TERM GOAL #2   Title  Pt will decrease worst shoulder pain as reported on NPRS to below 4/10 in order to demonstrate clinically significant reduction in pain.    Baseline  09/30/18: worst: 9/10; 10/30/18: 6/10; 11/27/18: 4/10; 12/23/18: 5/10 (pain is less frequent); 01/21/19: 5/10 (pain is less frequent); 03/19/19: 4/10; 6/4: 5/10; 05/22/19: 4/10 (decreased frequency of pain as well);    Time  8    Period  Weeks    Status  Partially Met    Target Date  07/31/19      PT LONG TERM GOAL #3   Title  Pt will improve PROM of R shoulder to within 10 degrees of R shoulder in order to improve functional use of R shoulder for ADLs/IADLs.    Baseline  R/L 129/150 Shoulder flexion, 100/165 Shoulder abduction, 50/93 Shoulder external rotation, 50/70 Shoulder internal rotation; 10/30/18: R/L 140/150 Shoulder flexion, 132/165 Shoulder abduction, 82/93 Shoulder external rotation, 70/70 Shoulder internal rotation; 12/23/18:  R/L 155/150 Shoulder flexion, 134/165 Shoulder abduction, 89/93 Shoulder external rotation, 70/70 Shoulder internal rotation, 01/21/19: R/L 154/150 Shoulder flexion, 136/165 Shoulder abduction, 85/93 Shoulder external rotation,   70/70 Shoulder internal rotation; 03/19/19: Unable to test over telemedicine 6/4: R: 128 flexion, 121 abduction, 78 shoulder ER, 70  shoulder IR; 05/22/19: R: 148 flexion (154 L side), 145 abduction (166 L side), 85 shoulder ER, 76 shoulder IR    Time  8    Period  Weeks    Status  Partially Met    Target Date  07/31/19      PT LONG TERM GOAL #4   Title  Pt will improve pain-free R shoulder strength for flexion and abduction so that it is symmetrical with L shoulder in order to return to full function at home    Baseline  12/23/18: R/L 4-/4+ Shoulder flexion, 4/4+ Shoulder abduction, both painful on R side; 01/21/19: R/L 4/4+ Shoulder flexion, 4/4+ Shoulder abduction, both painful on R side; 03/19/19: Unable to test over telemedicine 6/4: 4-/4; 05/22/19: R/L: Flexion: 4/4+ (no pain bilateral) Abduction: 4-/4 (painful on R side, no pain on L side);    Time  8    Period  Weeks    Status  Partially Met    Target Date  07/31/19      PT LONG TERM GOAL #5   Title  Pt will demonstrate decrease in NDI by at least 19% in order to demonstrate clinically significant reduction in disability related to neck injury/pain    Baseline  06/05/19: 36%    Time  8    Period  Weeks    Status  New    Target Date  07/31/19      Additional Long Term Goals   Additional Long Term Goals  --      PT LONG TERM GOAL #6   Title  --    Baseline  --    Time  --    Period  --    Status  --      PT LONG TERM GOAL #7   Title  --    Baseline  --    Time  --    Period  --    Status  --    Target Date  --            Plan - 06/23/19 0940    Clinical Impression Statement  Pt demonstrates good motivation during session. Pt tolerated treatment well today without reported c/o inc pain. She experienced numbness and tingling x1 throughout RUE during positional change however with repeated cervical retractions symptoms resolved within seconds. She continues to require mod-max verbal and tactile cueing throughout theraband and resisted exercises for proper technique and form. She has notable lack of  range with abduction and ER. Continued mechanical  cervical traction today as pt tolerated well at last treatment and had no reports of inc in symptom intensity or frequency. Encouraged pt to continue with her HEP and therapist will continue to progress her strength/ROM. Pt will benefit from PT services to address deficits in strength, mobility, and pain in order to return to full function at home.    Personal Factors and Comorbidities  Age;Comorbidity 2;Past/Current Experience    Comorbidities  afib, CAD, fibromyalgia, DM, depression    Examination-Activity Limitations  Bathing;Carry    Examination-Participation Restrictions  Cleaning;Community Activity    Stability/Clinical Decision Making  Unstable/Unpredictable    Rehab Potential  Good    PT Frequency  2x / week    PT Duration  8 weeks    PT Treatment/Interventions  ADLs/Self Care Home Management;Aquatic Therapy;Canalith Repostioning;Cryotherapy;Electrical Stimulation;Iontophoresis 4mg/ml Dexamethasone;Moist Heat;Traction;Ultrasound;Contrast Bath;DME Instruction;Gait training;Stair   training;Functional mobility training;Therapeutic activities;Therapeutic exercise;Balance training;Neuromuscular re-education;Patient/family education;Manual techniques;Passive range of motion;Dry needling;Splinting;Taping;Vestibular;Spinal Manipulations    PT Next Visit Plan  Progress AAROM/AROM/strengthening of R shoulder as tolerated without pain, cervical traction, cervical mobilizations, nerve glides    PT Home Exercise Plan  Cervical retractions, Scapular retractions, R upper trap stretch, supine canes for AAROM flexion with hands together progressing to AROM supine flexion, AROM sidelying shoulder abduction, AROM sidelying shoulder ER, seated pulleys for flexion and scaption ([id number])    Consulted and Agree with Plan of Care  Patient       Patient will benefit from skilled therapeutic intervention in order to improve the following deficits and impairments:  Decreased strength, Decreased range of motion,  Impaired UE functional use, Pain  Visit Diagnosis: 1. Cervicalgia   2. Radiculopathy, cervical region   3. Acute pain of right shoulder   4. Stiffness of right shoulder, not elsewhere classified   5. Muscle weakness (generalized)        Problem List Patient Active Problem List   Diagnosis Date Noted  . Chronic diarrhea 01/14/2019  . Malignant melanoma (HCC) 11/23/2016  . Urge incontinence 11/23/2016  . Atypical mole 09/22/2016  . Slurring of speech 05/12/2016  . Traumatic arthritis elbow 05/04/2015  . Left foot pain 12/31/2014  . Severe sprain of left ankle 12/31/2014  . Osteopenia 09/15/2014  . History of TIA (transient ischemic attack) 05/19/2014  . Fracture of coronoid process of ulna, left, closed 04/25/2014  . Ingrown toenail 01/15/2014  . Obesity (BMI 30-39.9) 10/08/2013  . OA (osteoarthritis) of knee 07/07/2013  . Chronic low back pain 05/09/2013  . Obstructive sleep apnea 05/09/2013  . GERD (gastroesophageal reflux disease) 05/09/2013  . Hiatal hernia 05/09/2013  . Fibromyalgia syndrome 05/09/2013  . Depression, major, in partial remission (HCC) 05/09/2013  . RLS (restless legs syndrome) 05/09/2013  . Diabetes mellitus with no complication (HCC) 05/09/2013  . High cholesterol 05/09/2013  . 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 Family history of colon cancer 05/09/2013  . Allergic rhinitis 05/09/2013  . Lateral meniscal tear 09/04/2012  . CAD (coronary artery disease) 09/27/2011  . HTN (hypertension) 09/27/2011  . Paroxysmal A-fib (HCC) 09/27/2011   This entire session was performed under direct supervision and direction of a licensed therapist/therapist assistant . I have personally read, edited and approve of the note as written.     SPT Jason D Huprich PT, DPT, GCS  Huprich,Jason 06/23/2019, 2:53 PM  [facility] [address] [place], [place], [address] Phone: [phone]   Fax:   [phone]  Name: Morgan [name] MRN: [id number] Date of Birth: 05/27/1941   

## 2019-06-23 NOTE — Progress Notes (Signed)
HPI :  78 y/o female here for a follow up visit. She has been followed for GERD and dysphagia in the past.   EGDFebruary 2017 showed small hiatal hernia, no evidence of Barrett's esophagus. She had too numerous to count fundic gland polyps, several >1cm in size. 24 HR pH testing on twice daily Protonix which showed a Demeester score of 40 with majority of reflux episodes being acidic, with symptom index of 75%. 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 She was switched to Dexilant 60mg  / day and has been doing okay on this regimen over the past few years, but does have ongoing breakthrough of pyrosis periodically. She previously endorsed dysphagia but denies any at this time. No swallowing difficulty. She broke her arm this past year and has had issues healing from that, she has a DEXA pending to assess for osteoporosis and she is concerned about long term risks of the PPIs, asks about other options.  Her main complaint today is change in bowel habits for the past few months. She is having increased stool frequency, usually 4-5 BMS per day, most with urgency after she eats. Stools range from soft to loose, no blood, some occasional abdominal cramping. She has had numerous episodes of fecal incontinence due to urge and this is bothering her significantly. She has a hard time not using the bathroom after eating, seems to be a reliable trigger. She still has her gallbladder. She has been taking immodium PRN which does help when she takes it. She has nocturnal symptoms with bowel movements which have been ongoing. She denies any routine NSAID use, she stopped using Mobic. Her sister had colon cancer diagnosed around age 27. Her last colonoscopy was in 2015 as below.   Last colonoscopy 10/2014 - 2 polyps removed, benign, told to f/u in 5 yrs given FH of CRC.    Past Medical History:  Diagnosis  Date  . Angina   . Arthritis   . Atrial fibrillation (HCC)    ASPIRIN FOR BLOOD THINNER  . Atypical mole 09/22/2016  . Bulging discs    lumbar   . Cancer ([place])    melanoma  . Complication of anesthesia    pt states has choking sensation with ET tube   . Coronary artery disease   . Depression   . Diabetes mellitus    diet controlled/on meds  . Fibromyalgia   . GERD (gastroesophageal reflux disease)   . H/O hiatal hernia   . Headache(784.0)    "recurring"  . High cholesterol   . Hyperlipidemia   . Hypertension   . 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 Migraines    "til ~ 1980"  . PONV (postoperative nausea and vomiting)   . Restless leg syndrome   . Sciatic nerve pain    "from pinched nerve"  . Sleep apnea    uses CPAP  . Weakness of right side of body    "I've had PT for it; they don't know what it's from".  CORTISONE INJECTION INTO BACK 08/30/12     Past Surgical History:  Procedure Laterality Date  . [place] STUDY N/A 12/29/2015   Procedure: [place] STUDY;  Surgeon: Manus Gunning, MD;  Location: [facility];  Service: Gastroenterology;  Laterality: N/A;  . CARPAL TUNNEL RELEASE Left 07/02/2015   Procedure: CARPAL TUNNEL RELEASE;  Surgeon: Frederik Pear, MD;  Location: [facility];  Service: Orthopedics;  Laterality: Left;  . CATARACT EXTRACTION, BILATERAL Bilateral    Oct and Nov 2017  . CORONARY ANGIOPLASTY WITH STENT PLACEMENT  06/2011   "1"  . CORONARY ARTERY BYPASS GRAFT  2005   CABG X 2  . DILATION AND CURETTAGE OF UTERUS     "more than once"  . ELBOW ARTHROSCOPY Left 07/02/2015   Procedure: ARTHROSCOPY LEFT ELBOW WITH DEBRIDEMENT AND REMOVAL LOOSE BODY;  Surgeon: Frederik Pear, MD;  Location: [facility];  Service: Orthopedics;  Laterality: Left;  . ESOPHAGEAL MANOMETRY N/A 12/29/2015   Procedure: ESOPHAGEAL MANOMETRY (EM);  Surgeon: Manus Gunning, MD;  Location: [facility];  Service: Gastroenterology;  Laterality: N/A;   . FRACTURE SURGERY  ~ 2005   nose  . KNEE ARTHROSCOPY  09/04/2012   Procedure: ARTHROSCOPY KNEE;  Surgeon: Gearlean Alf, MD;  Location: [facility];  Service: Orthopedics;  Laterality: Right;  right knee arthroscopy with medial and lateral meniscus debridement  . 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 MOUTH SURGERY  2004   "bone replacement; had cadavear bones put in; face was collapsing"  . NASAL SEPTUM SURGERY  ~ 1986  . TOTAL KNEE ARTHROPLASTY Right 07/07/2013   Procedure: RIGHT TOTAL KNEE ARTHROPLASTY;  Surgeon: Gearlean Alf, MD;  Location: [facility];  Service: Orthopedics;  Laterality: Right;   Family History  Problem Relation Age of Onset  . Cancer Mother 32       breast cancer  . Breast cancer Mother 68  . Heart disease Father 43       sudden onset due to CAD  . Cancer Sister 75       colon  . Hypertension Sister   . Dementia Sister   . Diabetes Sister   . Colon cancer Sister   . GER disease Daughter   . Hypertension Daughter   . Heart disease Brother   . Hypertension Brother   . Heart attack Neg Hx   . Stroke Neg Hx    Social History   Tobacco Use  . Smoking status: Never Smoker  . Smokeless tobacco: Never Used  Substance Use Topics  . Alcohol use: Yes    Alcohol/week: 1.0 standard drinks    Types: 1 Glasses of wine per week    Comment: "occasionally drink wine"  . Drug use: No   Current Outpatient Medications  Medication Sig Dispense Refill  . aspirin EC 81 MG tablet Take 1 tablet (81 mg total) by mouth daily.    [name] atorvastatin (LIPITOR) 80 MG tablet TAKE 1 TABLET BY MOUTH EVERY DAY 90 tablet 3  . CARAFATE 1 GM/10ML suspension TAKE 10 MLS (1 G TOTAL) BY MOUTH EVERY 6 (SIX) HOURS AS NEEDED. 420 mL 3  . cetirizine (ZYRTEC) 10 MG tablet Take 10 mg by mouth daily. Reported on 12/23/2015    . chlorthalidone (HYGROTON) 25 MG tablet Take 12.5 mg by mouth daily.    . clonazePAM (KLONOPIN) 1 MG tablet TAKE 2 TABLETS (2 MG TOTAL) BY MOUTH AT BEDTIME.  60 tablet 0  . DEXILANT 60 MG capsule TAKE 1 CAPSULE BY MOUTH EVERY DAY 30 capsule 0  . gabapentin (NEURONTIN) 300 MG capsule TAKE 1 TO 2 CAPSULES BY MOUTH AT BEDTIME 180 capsule 1  . glucose blood (FREESTYLE LITE) test strip Use to check blood sugar twice daily 125 each 3  . Insulin Pen Needle (NOVOFINE) 32G X 6 MM MISC USE TO INJECT VICTOZA  DAILY. DX: E11.9 90 each 3  . Lancet Device MISC Use to check blood sugar twice daily. 1 each 0  . Lancets (FREESTYLE) lancets Use to check blood sugar twice daily 100 each 11  . meloxicam (MOBIC) 15 MG tablet Take 15 mg by mouth daily. Reported on 12/23/2015    . metFORMIN (GLUCOPHAGE-XR) 500 MG 24 hr tablet TAKE 2 TABLETS BY MOUTH TWICE A DAY 360 tablet 3  . metoprolol succinate (TOPROL-XL) 100 MG 24 hr tablet TAKE 1 TABLET (100 MG TOTAL) BY MOUTH DAILY. TAKE WITH OR IMMEDIATELY FOLLOWING A MEAL. 90 tablet 1  . Multiple Vitamins-Minerals (PRESERVISION AREDS PO) Take 1 tablet by mouth 2 (two) times daily. Reported on 12/23/2015    . MYRBETRIQ 25 MG TB24 tablet TAKE 1 TABLET BY MOUTH EVERY DAY 90 tablet 1  . nitroGLYCERIN (NITROSTAT) 0.4 MG SL tablet PLACE 1 TABLET (0.4 MG TOTAL) UNDER THE TONGUE EVERY 5 (FIVE) MINUTES AS NEEDED FOR CHEST PAIN 25 tablet 0  . tiZANidine (ZANAFLEX) 4 MG tablet TAKE 1 TABLET BY MOUTH EVERY DAY IN THE EVENING 30 tablet 0  . traMADol (ULTRAM) 50 MG tablet Take 50 mg by mouth every 6 (six) hours as needed. for pain  0  . venlafaxine XR (EFFEXOR-XR) 37.5 MG 24 hr capsule TAKE 1 CAPSULE (37.5 MG TOTAL) BY MOUTH 2 (TWO) TIMES DAILY. 180 capsule 1  . VICTOZA 18 MG/3ML SOPN INJECT 1.8 MG INTO THE SKIN DAILY. 27 pen 3   No current facility-administered medications for this visit.    Allergies  Allergen Reactions  . Hydrocodone-Acetaminophen Other (See Comments)    "Changed personality" "made me very mean"  . Percocet [Oxycodone-Acetaminophen] Other (See Comments)    hallucination  . Sulfa Antibiotics Other (See Comments)     hallucinations     Review of Systems: All systems reviewed and negative except where noted in HPI.   Lab Results  Component Value Date   WBC 6.6 03/21/2017   HGB 12.1 03/21/2017   HCT 36.0 03/21/2017   MCV 88.5 03/21/2017   PLT 286 03/21/2017    Lab Results  Component Value Date   CREATININE 0.81 12/23/2018   BUN 21 12/23/2018   NA 143 12/23/2018   K 4.3 12/23/2018   CL 104 12/23/2018   CO2 30 12/23/2018    Lab Results  Component Value Date   ALT 19 12/23/2018   AST 19 12/23/2018   ALKPHOS 77 12/23/2018   BILITOT 0.6 12/23/2018      Physical Exam: BP (!) 160/80 (BP Location: Left Arm)   Pulse 88   Temp 98.3 F (36.8 C)   Ht 5' 2.25" (1.581 m)   Wt 151 lb 12.8 oz (68.9 kg)   SpO2 96%   BMI 27.54 kg/m  Constitutional: Pleasant,well-developed, female in no acute distress. HEENT: Normocephalic and atraumatic. Conjunctivae are normal. No scleral icterus. Neck supple.  Cardiovascular: Normal rate, regular rhythm.  Pulmonary/chest: Effort normal and breath sounds normal. No wheezing, rales or rhonchi. Abdominal: Soft, nondistended, nontender. . There are no masses palpable. No hepatomegaly. Extremities: no edema Lymphadenopathy: No cervical adenopathy noted. Neurological: Alert and oriented to person place and time. Skin: Skin is warm and dry. No rashes noted. Psychiatric: Normal mood and affect. Behavior is normal.   ASSESSMENT AND PLAN: 78 y/o female here for reassessment of the following issues:  Change in bowel habits / Fecal incontinence - increased stool frequency associated with urgency / incontinence, and nocturnal symptoms. Ongoing for a few months  now, this is chronic. She is not using any NSAIDs. Discussed ddx with her and recommend a colonoscopy to further evaluate. Need to rule out IBD and microscopic colitis which could present like this. In light of her FH of colon cancer she had also wanted to still proceed with a colonoscopy for screening  purposes despite her age. I have discussed risks / benefits of colonoscopy and anesthesia with her, and she wanted to proceed. Further recommendations pending the results. She is requesting [facility] to use a lower volume prep.  GERD - ongoing intermittent reflux symptoms with breakthrough despite high dose Dexilant. She is being evaluated for osteoporosis and is concerned about long term risks. Discussed options. At her age one could consider a TIF if she is a candidate, and hopefully avoid a surgery. She would need a current EGD to see if she is a candidate, and consideration for repeat manometry given prior ? Jackhammer esophagus, although she has no dysphagia or chest discomfort. She wanted to proceed with an EGD to be done at the same time as her colonoscopy after discussion of risks / benefits. Further recommendations pending her course.  Newsoms Cellar, MD [facility] Gastroenterology

## 2019-06-23 NOTE — Patient Instructions (Addendum)
If you are age 77 or older, your body mass index should be between 23-30. Your Body mass index is 27.54 kg/m. If this is out of the aforementioned range listed, please consider follow up with your Primary Care Provider.  If you are age 77 or younger, your body mass index should be between 19-25. Your Body mass index is 27.54 kg/m. If this is out of the aformentioned range listed, please consider follow up with your Primary Care Provider.   To help prevent the possible spread of infection to our patients, communities, and staff; we will be implementing the following measures:  As of now we are not allowing any visitors/family members to accompany you to any upcoming appointments with [facility]. If you have any concerns about this please contact our office to discuss prior to the appointment.    You have been scheduled for an endoscopy and colonoscopy. Please follow the written instructions given to you at your visit today. Please pick up your prep supplies at the pharmacy within the next 1-3 days. If you use inhalers (even only as needed), please bring them with you on the day of your procedure. Your physician has requested that you go to www.[email or web address].com and enter the access code given to you at your visit today. This web site gives a general overview about your procedure. However, you should still follow specific instructions given to you by our office regarding your preparation for the procedure.  Please purchase the following medications over the counter and take as directed: Imodium, take as directed, daily  We are giving you a PLENVU sample today to use for you colonoscopy prep.   Thank you for entrusting me with your care and for choosing [facility], Dr. Heeney Cellar

## 2019-06-26 ENCOUNTER — Ambulatory Visit: Payer: Medicare Other

## 2019-06-26 ENCOUNTER — Other Ambulatory Visit: Payer: Self-pay

## 2019-06-26 DIAGNOSIS — M542 Cervicalgia: Secondary | ICD-10-CM | POA: Diagnosis not present

## 2019-06-26 DIAGNOSIS — M25511 Pain in right shoulder: Secondary | ICD-10-CM

## 2019-06-26 DIAGNOSIS — M5412 Radiculopathy, cervical region: Secondary | ICD-10-CM

## 2019-06-26 NOTE — Therapy (Signed)
Morgan [name], Morgan [name], Morgan [name]   Fax:  [phone]  Physical Therapy Treatment  Patient Details  Name: Morgan [name] MRN: [id number] Date of Birth: [birth year] Referring Provider (PT): Dr. Mayer Camel   Encounter Date: 06/26/2019  PT End of Session - 06/26/19 0814    Visit Number  46    Number of Visits  81    Date for PT Re-Evaluation  07/31/19    Authorization Type  shoulder goals updated 05/22/19, neck goals created 06/05/19    PT Start Time  0812    PT Stop Time  0857    PT Time Calculation (min)  45 min    Activity Tolerance  Patient tolerated treatment well    Behavior During Therapy  [facility] for tasks assessed/performed       Past Medical History:  Diagnosis Date  . Angina   . Arthritis   . Atrial fibrillation (HCC)    ASPIRIN FOR BLOOD THINNER  . Atypical mole 09/22/2016  . Bulging discs    lumbar   . Cancer ([place])    melanoma  . Complication of anesthesia    pt states has choking sensation with ET tube   . Coronary artery disease   . Depression   . Diabetes mellitus    diet controlled/on meds  . Fibromyalgia   . GERD (gastroesophageal reflux disease)   . H/O hiatal hernia   . Headache(784.0)    "recurring"  . High cholesterol   . Hyperlipidemia   . Hypertension   . 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 Migraines    "til ~ 1980"  . PONV (postoperative nausea and vomiting)   . Restless leg syndrome   . Sciatic nerve pain    "from pinched nerve"  . Sleep apnea    uses CPAP  . Weakness of right side of body    "I've had PT for it; they don't know what it's from".  CORTISONE INJECTION INTO BACK 08/30/12    Past Surgical History:  Procedure Laterality Date  . [place] STUDY N/A 12/29/2015   Procedure: [place] STUDY;  Surgeon: Manus Gunning, MD;  Location: [facility];  Service: Gastroenterology;  Laterality: N/A;  . CARPAL TUNNEL RELEASE Left 07/02/2015    Procedure: CARPAL TUNNEL RELEASE;  Surgeon: Frederik Pear, MD;  Location: [place];  Service: Orthopedics;  Laterality: Left;  . CATARACT EXTRACTION, BILATERAL Bilateral    Oct and Nov 2017  . CORONARY ANGIOPLASTY WITH STENT PLACEMENT  06/2011   "1"  . CORONARY ARTERY BYPASS GRAFT  2005   CABG X 2  . DILATION AND CURETTAGE OF UTERUS     "more than once"  . ELBOW ARTHROSCOPY Left 07/02/2015   Procedure: ARTHROSCOPY LEFT ELBOW WITH DEBRIDEMENT AND REMOVAL LOOSE BODY;  Surgeon: Frederik Pear, MD;  Location: [place];  Service: Orthopedics;  Laterality: Left;  . ESOPHAGEAL MANOMETRY N/A 12/29/2015   Procedure: ESOPHAGEAL MANOMETRY (EM);  Surgeon: Manus Gunning, MD;  Location: [facility];  Service: Gastroenterology;  Laterality: N/A;  . FRACTURE SURGERY  ~ 2005   nose  . KNEE ARTHROSCOPY  09/04/2012   Procedure: ARTHROSCOPY KNEE;  Surgeon: Gearlean Alf, MD;  Location: [facility];  Service: Orthopedics;  Laterality: Right;  right knee arthroscopy with medial and lateral meniscus debridement  . 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 MOUTH SURGERY  2004   "  bone replacement; had cadavear bones put in; face was collapsing"  . NASAL SEPTUM SURGERY  ~ 1986  . TOTAL KNEE ARTHROPLASTY Right 07/07/2013   Procedure: RIGHT TOTAL KNEE ARTHROPLASTY;  Surgeon: Gearlean Alf, MD;  Location: [facility];  Service: Orthopedics;  Laterality: Right;    There were no vitals filed for this visit.  Subjective Assessment - 06/26/19 0815    Subjective  Pt reports experiencing mild soreness after last session that lasted 1-2 days. She reports that the RUE N&T has increased in frequency and duration since last week. Pt states that she will be having an MRI on her cervical spine this afternoon 06/26/19 and will f/u as necessary. Pt has had some recent GI disturbances related to bowel incontinence that she has seen Dr. Havery Moros for on 06/23/19. Pt reports no N&T or  weakness throughout BLE and denies saddle anesthesia as well.    Pertinent History  On 08/03/18 pt was carrying a folding table and fell and right arm was under the handle got stuck between the door and the table. She landed on her R side. Tried to get up and had to pull at her arm. Pt had immediate pain in her right shoulder. She waited until Monday and went to see Dr. Edilia Bo her PCP. He took radiographs but didn't find any fractures. She continued to have pain so she called Dr. Damita Dunnings office, her orthopedist, and saw him that same week. He took additional radiographs and found a hairline fracture in her humeral shaft and a greater tuberosity fracture per patient report. She states that she is not healing well and has recurrent plain film radiographs. She is still wearing a sling and was encouraged not to perform any AROM of her right shoulder. Since her last visit with Dr. Mayer Camel on 09/20/18 she has had an additional fall and landed on her R shoulder. She reports additional bruising and pain in her R shoulder. She has a follow-up appt scheduled with her orthopedist for 10/15/18. 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 ROS negative for red flags. 06/05/19: Pt presents to physical therapy with reports of intermittent RUE numbness and tingling that began about 4 weeks ago. The numbness and tingling begins at the shoulder and extends down into the elbow. On days when it is more intense, it begins in the cervical spine. After about 5 seconds, numbness tingling subsides, and she experiences min hyperalgesia. Pt was told by physician that if numbness and tingling does not improve with conservative therapy after 3 months an MRI will be ordered. She is currently being seen in physical therapy for R shoulder pain due to recovery post greater tubercle fracture.    Limitations  Lifting    Diagnostic tests  Plain film radiographs with decreased healing per pt report    Patient Stated Goals   Improve ability to use RUE and decrease pain    Currently in Pain?  No/denies    Pain Score  0-No pain    Pain Onset  --         OBJECTIVE  Reflexes Biceps: 2+ bilaterally  Brachioradialis: 2+ bilaterally Triceps: 2+ bilaterally  Patellar: 3+ bilaterally; mildly hyperreflexic  DF: 2+ bilaterally   Hoffmans: +R -L  Clonus UE/LE: negative  Babinski: positive bilaterally    TREATMENT       Therapeutic Exercise UBE 2.5 min forward/2.5 min backward for warm-up during history Bilateral standing rows, R shoulder extension with green theraband x15  each; tactile and verbal cueing needed for form  Prone I's T's 1#, Y's with gravity resistance x15 each  Sidelying R shoulder abduction and ER 1# x15 each; mod-max verbal and tactile cues needed for form      Mechanical Cervical Traction  Intermittent, 30 degree pull angle, 18# 30s hold time, 12# 10s rest time x10 minutes total. Pt denies any numbness/tingling at end of traction. *with bolster under knees for comfort     Pt educated throughout session about proper posture and technique with exercises. Improved exercise technique, movement at target joints, use of target muscles after min to mod verbal cues.     Pt demonstrates good motivation during session. Pt presents with 2+ reflexes bilaterally throughout UE and LE, except for bilateral hyperreflexic patellar reflexes. She demonstrates a positive Hoffman's on the right and positive Babinki bilaterally. Pt demonstrates improved form and control with sidelying ER requiring min verbal cueing for form. She continues to demonstrates lack in range of motion for right ER.  She demonstrates improvement in strength with inc repetitions throughout therex. Continue to progress resistance while maintaining proper form. Continued mechanical cervical traction today as pt tolerated well at last treatment. Encouraged pt to continue with her HEP and therapist will continue to progress her strength/ROM. Pt  will benefit from PT services to address deficits in strength, mobility, and pain in order to return to full function at home.          PT Education - 06/26/19 0815    Education Details  technique & form throughout session    Person(s) Educated  Patient    Methods  Explanation;Demonstration;Tactile cues;Verbal cues    Comprehension  Verbal cues required;Tactile cues required;Returned demonstration;Verbalized understanding       PT Short Term Goals - 05/22/19 1025      PT SHORT TERM GOAL #1   Title  Pt will be independent with HEP in order to improve strength and decrease pain in order to improve pain-free function at home.    Time  4    Period  Weeks    Status  Achieved        PT Long Term Goals - 06/05/19 1117      PT LONG TERM GOAL #1   Title  Pt will decrease quick DASH score to below 30% in order to demonstrate clinically significant reduction in disability.    Baseline  09/30/18: 84.1%; 10/30/18: 56.8%; 11/27/18: 45.45%; 12/23/18: 40.9%; 01/21/19: 29.5%, 6/4: 43%; 05/22/19: 43.2%    Time  8    Period  Weeks    Status  Partially Met    Target Date  07/31/19      PT LONG TERM GOAL #2   Title  Pt will decrease worst shoulder pain as reported on NPRS to below 4/10 in order to demonstrate clinically significant reduction in pain.    Baseline  09/30/18: worst: 9/10; 10/30/18: 6/10; 11/27/18: 4/10; 12/23/18: 5/10 (pain is less frequent); 01/21/19: 5/10 (pain is less frequent); 03/19/19: 4/10; 6/4: 5/10; 05/22/19: 4/10 (decreased frequency of pain as well);    Time  8    Period  Weeks    Status  Partially Met    Target Date  07/31/19      PT LONG TERM GOAL #3   Title  Pt will improve PROM of R shoulder to within 10 degrees of R shoulder in order to improve functional use of R shoulder for ADLs/IADLs.    Baseline  R/L 129/150 Shoulder flexion, 100/165  Shoulder abduction, 50/93 Shoulder external rotation, 50/70 Shoulder internal rotation; 10/30/18: R/L 140/150 Shoulder flexion, 132/165  Shoulder abduction, 82/93 Shoulder external rotation, 70/70 Shoulder internal rotation; 12/23/18:  R/L 155/150 Shoulder flexion, 134/165 Shoulder abduction, 89/93 Shoulder external rotation, 70/70 Shoulder internal rotation, 01/21/19: R/L 154/150 Shoulder flexion, 136/165 Shoulder abduction, 85/93 Shoulder external rotation, 70/70 Shoulder internal rotation; 03/19/19: Unable to test over telemedicine 6/4: R: 128 flexion, 121 abduction, 78 shoulder ER, 70 shoulder IR; 05/22/19: R: 148 flexion (154 L side), 145 abduction (166 L side), 85 shoulder ER, 76 shoulder IR    Time  8    Period  Weeks    Status  Partially Met    Target Date  07/31/19      PT LONG TERM GOAL #4   Title  Pt will improve pain-free R shoulder strength for flexion and abduction so that it is symmetrical with L shoulder in order to return to full function at home    Baseline  12/23/18: R/L 4-/4+ Shoulder flexion, 4/4+ Shoulder abduction, both painful on R side; 01/21/19: R/L 4/4+ Shoulder flexion, 4/4+ Shoulder abduction, both painful on R side; 03/19/19: Unable to test over telemedicine 6/4: 4-/4; 05/22/19: R/L: Flexion: 4/4+ (no pain bilateral) Abduction: 4-/4 (painful on R side, no pain on L side);    Time  8    Period  Weeks    Status  Partially Met    Target Date  07/31/19      PT LONG TERM GOAL #5   Title  Pt will demonstrate decrease in NDI by at least 19% in order to demonstrate clinically significant reduction in disability related to neck injury/pain    Baseline  06/05/19: 36%    Time  8    Period  Weeks    Status  New    Target Date  07/31/19      Additional Long Term Goals   Additional Long Term Goals  --      PT LONG TERM GOAL #6   Title  --    Baseline  --    Time  --    Period  --    Status  --      PT LONG TERM GOAL #7   Title  --    Baseline  --    Time  --    Period  --    Status  --    Target Date  --            Plan - 06/26/19 0815    Clinical Impression Statement  Pt demonstrates good motivation  during session. Pt presents with 2+ reflexes bilaterally throughout UE and LE, except for bilateral hyperreflexic patellar reflexes. She demonstrates a positive Hoffman's on the right and positive Babinki bilaterally. Pt demonstrates improved form and control with sidelying ER requiring min verbal cueing for form. She continues to demonstrates lack in range of motion for right ER.  She demonstrates improvement in strength with inc repetitions throughout therex. Continue to progress resistance while maintaining proper form. Continued mechanical cervical traction today as pt tolerated well at last treatment. Encouraged pt to continue with her HEP and therapist will continue to progress her strength/ROM. Pt will benefit from PT services to address deficits in strength, mobility, and pain in order to return to full function at home.    Personal Factors and Comorbidities  Age;Comorbidity 2;Past/Current Experience    Comorbidities  afib, CAD, fibromyalgia, DM, depression    Examination-Activity Limitations  Bathing;Carry  Examination-Participation Restrictions  Cleaning;Community Activity    Stability/Clinical Decision Making  Unstable/Unpredictable    Rehab Potential  Good    PT Frequency  2x / week    PT Duration  8 weeks    PT Treatment/Interventions  ADLs/Self Care Home Management;Aquatic Therapy;Canalith Repostioning;Cryotherapy;Electrical Stimulation;Iontophoresis 11m/ml Dexamethasone;Moist Heat;Traction;Ultrasound;Contrast Bath;DME Instruction;Gait training;Stair training;Functional mobility training;Therapeutic activities;Therapeutic exercise;Balance training;Neuromuscular re-education;Patient/family education;Manual techniques;Passive range of motion;Dry needling;Splinting;Taping;Vestibular;Spinal Manipulations    PT Next Visit Plan  Progress AAROM/AROM/strengthening of R shoulder as tolerated without pain, cervical traction, cervical mobilizations, nerve glides    PT Home Exercise Plan  Cervical  retractions, Scapular retractions, R upper trap stretch, supine canes for AAROM flexion with hands together progressing to AROM supine flexion, AROM sidelying shoulder abduction, AROM sidelying shoulder ER, seated pulleys for flexion and scaption ([id number])    Consulted and Agree with Plan of Care  Patient       Patient will benefit from skilled therapeutic intervention in order to improve the following deficits and impairments:  Decreased strength, Decreased range of motion, Impaired UE functional use, Pain  Visit Diagnosis: 1. Cervicalgia   2. Radiculopathy, cervical region   3. Acute pain of right shoulder        Problem List Patient Active Problem List   Diagnosis Date Noted  . Chronic diarrhea 01/14/2019  . Malignant melanoma ([place] 11/23/2016  . Urge incontinence 11/23/2016  . Atypical mole 09/22/2016  . Slurring of speech 05/12/2016  . Traumatic arthritis elbow 05/04/2015  . Left foot pain 12/31/2014  . Severe sprain of left ankle 12/31/2014  . Osteopenia 09/15/2014  . History of TIA (transient ischemic attack) 05/19/2014  . Fracture of coronoid process of ulna, left, closed 04/25/2014  . Ingrown toenail 01/15/2014  . Obesity (BMI 30-39.9) 10/08/2013  . OA (osteoarthritis) of knee 07/07/2013  . Chronic low back pain 05/09/2013  . Obstructive sleep apnea 05/09/2013  . GERD (gastroesophageal reflux disease) 05/09/2013  . Hiatal hernia 05/09/2013  . Fibromyalgia syndrome 05/09/2013  . Depression, major, in partial remission ([place] 05/09/2013  . RLS (restless legs syndrome) 05/09/2013  . Diabetes mellitus with no complication ([place] 002/54/2706 . High cholesterol 05/09/2013  . 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 Family history of colon cancer 05/09/2013  . Allergic rhinitis 05/09/2013  . Lateral meniscal tear 09/04/2012  . CAD (coronary artery disease) 09/27/2011  . HTN (hypertension) 09/27/2011  . Paroxysmal A-fib ([place] 09/27/2011    This entire session was  performed under direct supervision and direction of a licensed therapist/therapist assistant . I have personally read, edited and approve of the note as written.   NElmyra RicksTurcotte SPT  JPhillips GroutPT, DPT, GCS  [name],Morgan 06/26/2019, 11:37 AM  CNew CumberlandMAIN RReconstructive Surgery Center Of Newport Beach IncSERVICES [address] [address]Phone: [phone]  Fax:  [phone] Name: Morgan HitzMRN: [id number] of Birth: [name]

## 2019-06-27 ENCOUNTER — Telehealth: Payer: Self-pay | Admitting: Gastroenterology

## 2019-06-27 NOTE — Telephone Encounter (Signed)
°

## 2019-06-27 NOTE — Telephone Encounter (Signed)
Pt responded "no" to all screening questions °

## 2019-06-30 ENCOUNTER — Ambulatory Visit: Payer: Medicare Other

## 2019-06-30 ENCOUNTER — Encounter: Payer: Self-pay | Admitting: Gastroenterology

## 2019-06-30 ENCOUNTER — Other Ambulatory Visit: Payer: Self-pay

## 2019-06-30 ENCOUNTER — Ambulatory Visit (AMBULATORY_SURGERY_CENTER): Payer: Medicare Other | Admitting: Gastroenterology

## 2019-06-30 VITALS — BP 159/67 | HR 67 | Temp 97.3°F | Resp 13 | Ht 62.25 in | Wt 151.0 lb

## 2019-06-30 DIAGNOSIS — D124 Benign neoplasm of descending colon: Secondary | ICD-10-CM

## 2019-06-30 DIAGNOSIS — D122 Benign neoplasm of ascending colon: Secondary | ICD-10-CM

## 2019-06-30 DIAGNOSIS — D125 Benign neoplasm of sigmoid colon: Secondary | ICD-10-CM | POA: Diagnosis not present

## 2019-06-30 DIAGNOSIS — K5730 Diverticulosis of large intestine without perforation or abscess without bleeding: Secondary | ICD-10-CM

## 2019-06-30 DIAGNOSIS — D123 Benign neoplasm of transverse colon: Secondary | ICD-10-CM

## 2019-06-30 DIAGNOSIS — K6389 Other specified diseases of intestine: Secondary | ICD-10-CM | POA: Diagnosis not present

## 2019-06-30 DIAGNOSIS — K449 Diaphragmatic hernia without obstruction or gangrene: Secondary | ICD-10-CM | POA: Diagnosis not present

## 2019-06-30 DIAGNOSIS — R197 Diarrhea, unspecified: Secondary | ICD-10-CM

## 2019-06-30 DIAGNOSIS — R194 Change in bowel habit: Secondary | ICD-10-CM

## 2019-06-30 DIAGNOSIS — K317 Polyp of stomach and duodenum: Secondary | ICD-10-CM | POA: Diagnosis not present

## 2019-06-30 DIAGNOSIS — K219 Gastro-esophageal reflux disease without esophagitis: Secondary | ICD-10-CM

## 2019-06-30 HISTORY — PX: COLONOSCOPY: SHX174

## 2019-06-30 MED ORDER — SODIUM CHLORIDE 0.9 % IV SOLN: 500.0000 mL | Freq: Once | INTRAVENOUS | Status: DC

## 2019-06-30 NOTE — Progress Notes (Signed)
Pt's states no medical or surgical changes since previsit or office visit.   covid screen and temperature @ front desk by JB. Vs in adm by [employer]

## 2019-06-30 NOTE — Op Note (Addendum)
[place] Patient Name: Morgan [name] Procedure Date: 06/30/2019 3:30 PM MRN: [id number] Endoscopist: Remo Lipps P. Havery Moros , MD Age: 78 Referring MD:  Date of Birth: May 19, 1941 Gender: Female Account #: [id number] Procedure:                Colonoscopy Indications:              Chronic diarrhea, Change in bowel habits, sister                            with colon cancer at age 47 Medicines:                Monitored Anesthesia Care Procedure:                Pre-Anesthesia Assessment:                           - Prior to the procedure, a History and Physical                            was performed, and patient medications and                            allergies were reviewed. The patient's tolerance of                            previous anesthesia was also reviewed. The risks                            and benefits of the procedure and the sedation                            options and risks were discussed with the patient.                            All questions were answered, and informed consent                            was obtained. Prior Anticoagulants: The patient has                            taken no previous anticoagulant or antiplatelet                            agents. ASA Grade Assessment: III - A patient with                            severe systemic disease. After reviewing the risks                            and benefits, the patient was deemed in                            satisfactory condition to undergo the procedure.  After obtaining informed consent, the colonoscope                            was passed under direct vision. Throughout the                            procedure, the patient's blood pressure, pulse, and                            oxygen saturations were monitored continuously. The                            Colonoscope was introduced through the anus and                            advanced to the the  cecum, identified by                            appendiceal orifice and ileocecal valve. The                            colonoscopy was performed without difficulty. The                            patient tolerated the procedure well. The quality                            of the bowel preparation was adequate. The                            ileocecal valve, appendiceal orifice, and rectum                            were photographed. Scope In: 3:45:48 PM Scope Out: 4:20:42 PM Scope Withdrawal Time: 0 hours 26 minutes 24 seconds  Total Procedure Duration: 0 hours 34 minutes 54 seconds  Findings:                 The perianal and digital rectal examinations were                            normal.                           Five sessile polyps were found in the ascending                            colon. The polyps were 3 to 6 mm in size. These                            polyps were removed with a cold snare. Resection                            and retrieval were complete.  Two sessile polyps were found in the hepatic                            flexure. The polyps were 3 to 4 mm in size. These                            polyps were removed with a cold snare. Resection                            and retrieval were complete.                           Five sessile polyps were found in the transverse                            colon. The polyps were 2 to 5 mm in size. These                            polyps were removed with a cold snare. Resection                            and retrieval were complete.                           A 3 mm polyp was found in the descending colon. The                            polyp was sessile. The polyp was removed with a                            cold snare. Resection and retrieval were complete.                           A 3 mm polyp was found in the sigmoid colon. The                            polyp was sessile. The polyp was removed  with a                            cold snare. Resection and retrieval were complete.                           A few small-mouthed diverticula were found in the                            sigmoid colon.                           The exam was otherwise without abnormality.                            Multiple attempts were made to intubate the ileum  but due to angulation at the entrance and difficult                            positioning, it was not successful.                           Biopsies for histology were taken with a cold                            forceps from the right colon, left colon and                            transverse colon for evaluation of microscopic                            colitis. Complications:            No immediate complications. Estimated blood loss:                            Minimal. Estimated Blood Loss:     Estimated blood loss was minimal. Impression:               - Five 3 to 6 mm polyps in the ascending colon,                            removed with a cold snare. Resected and retrieved.                           - Two 3 to 4 mm polyps at the hepatic flexure,                            removed with a cold snare. Resected and retrieved.                           - Five 2 to 5 mm polyps in the transverse colon,                            removed with a cold snare. Resected and retrieved.                           - One 3 mm polyp in the descending colon, removed                            with a cold snare. Resected and retrieved.                           - One 3 mm polyp in the sigmoid colon, removed with                            a cold snare. Resected and retrieved.                           -  Diverticulosis in the sigmoid colon.                           - The examination was otherwise normal.                           - Biopsies were taken with a cold forceps from the                            right colon, left colon  and transverse colon for                            evaluation of microscopic colitis. Recommendation:           - Patient has a contact number available for                            emergencies. The signs and symptoms of potential                            delayed complications were discussed with the                            patient. Return to normal activities tomorrow.                            Written discharge instructions were provided to the                            patient.                           - Resume previous diet.                           - Continue present medications.                           - Await pathology results. Remo Lipps P. Richie Vadala, MD 06/30/2019 4:29:51 PM This report has been signed electronically.

## 2019-06-30 NOTE — Progress Notes (Signed)
A/ox3, pleased with MAC, report to RN 

## 2019-06-30 NOTE — Op Note (Signed)
[facility] Patient Name: Morgan [name] Procedure Date: 06/30/2019 3:30 PM MRN: [id number] Endoscopist: Remo Lipps P. Havery Moros , MD Age: 78 Referring MD:  Date of Birth: [birth year] Gender: Female Account #: [id number] Procedure:                Upper GI endoscopy Indications:              Follow-up of gastro-esophageal reflux disease,                            persistent symptoms despite Dexilant, persistent                            diarrhea Medicines:                Monitored Anesthesia Care Procedure:                Pre-Anesthesia Assessment:                           - Prior to the procedure, a History and Physical                            was performed, and patient medications and                            allergies were reviewed. The patient's tolerance of                            previous anesthesia was also reviewed. The risks                            and benefits of the procedure and the sedation                            options and risks were discussed with the patient.                            All questions were answered, and informed consent                            was obtained. Prior Anticoagulants: The patient has                            taken no previous anticoagulant or antiplatelet                            agents. ASA Grade Assessment: III - A patient with                            severe systemic disease. After reviewing the risks                            and benefits, the patient was deemed in  satisfactory condition to undergo the procedure.                           After obtaining informed consent, the endoscope was                            passed under direct vision. Throughout the                            procedure, the patient's blood pressure, pulse, and                            oxygen saturations were monitored continuously. The                            Endoscope was introduced through the  mouth, and                            advanced to the second part of duodenum. The upper                            GI endoscopy was accomplished without difficulty.                            The patient tolerated the procedure well. Scope In: Scope Out: Findings:                 Esophagogastric landmarks were identified: the                            Z-line was found at 35 cm, the gastroesophageal                            junction was found at 35 cm and the upper extent of                            the gastric folds was found at 37 cm from the                            incisors.                           A 2 cm hiatal hernia was present. Hill grade II of                            the cardia on retroflexed views.                           The exam of the esophagus was otherwise normal.                           Multiple small sessile polyps were found in the  entire examined stomach. These have been biopsied /                            sampled previously and benign fundic gland polyps,                            actually smaller in size than previously seen.                           The exam of the stomach was otherwise normal.                           A single diminutive sessile polyp was found in the                            second portion of the duodenum. The polyp was                            removed with a cold biopsy forceps. Resection and                            retrieval were complete.                           The exam of the duodenum was otherwise normal.                           Biopsies for histology were taken with a cold                            forceps in the duodenal bulb and in the second                            portion of the duodenum for evaluation of celiac                            disease. Complications:            No immediate complications. Estimated blood loss:                            Minimal. Estimated Blood  Loss:     Estimated blood loss was minimal. Impression:               - Esophagogastric landmarks identified.                           - 2 cm hiatal hernia.                           - Normal esophagus otherwise                           - Multiple small benign appearing fundic gland  gastric polyps.                           - A single duodenal polyp. Resected and retrieved.                           - Biopsies were taken with a cold forceps for                            evaluation of celiac disease. Recommendation:           - Patient has a contact number available for                            emergencies. The signs and symptoms of potential                            delayed complications were discussed with the                            patient. Return to normal activities tomorrow.                            Written discharge instructions were provided to the                            patient.                           - Resume previous diet.                           - Continue present medications.                           - Await pathology results.                           - Will discuss options for reflux with patient (she                            had wished to consider TIF given desire to come off                            PPIs, will discuss with Dr. Bryan Lemma) Remo Lipps P. Paiton Fosco, MD 06/30/2019 4:35:31 PM This report has been signed electronically.

## 2019-06-30 NOTE — Patient Instructions (Signed)
Handouts Provided:  Polyps  YOU HAD AN ENDOSCOPIC PROCEDURE TODAY AT [facility]:   Refer to the procedure report that was given to you for any specific questions about what was found during the examination.  If the procedure report does not answer your questions, please call your gastroenterologist to clarify.  If you requested that your care partner not be given the details of your procedure findings, then the procedure report has been included in a sealed envelope for you to review at your convenience later.  YOU SHOULD EXPECT: Some feelings of bloating in the abdomen. Passage of more gas than usual.  Walking can help get rid of the air that was put into your GI tract during the procedure and reduce the bloating. If you had a lower endoscopy (such as a colonoscopy or flexible sigmoidoscopy) you may notice spotting of blood in your stool or on the toilet paper. If you underwent a bowel prep for your procedure, you may not have a normal bowel movement for a few days.  Please Note:  You might notice some irritation and congestion in your nose or some drainage.  This is from the oxygen used during your procedure.  There is no need for concern and it should clear up in a day or so.  SYMPTOMS TO REPORT IMMEDIATELY:   Following lower endoscopy (colonoscopy or flexible sigmoidoscopy):  Excessive amounts of blood in the stool  Significant tenderness or worsening of abdominal pains  Swelling of the abdomen that is new, acute  Fever of 100F or higher   Following upper endoscopy (EGD)  Vomiting of blood or coffee ground material  New chest pain or pain under the shoulder blades  Painful or persistently difficult swallowing  New shortness of breath  Fever of 100F or higher  Black, tarry-looking stools  For urgent or emergent issues, a gastroenterologist can be reached at any hour by calling [phone].   DIET:  We do recommend a small meal at first, but then you may proceed  to your regular diet.  Drink plenty of fluids but you should avoid alcoholic beverages for 24 hours.  ACTIVITY:  You should plan to take it easy for the rest of today and you should NOT DRIVE or use heavy machinery until tomorrow (because of the sedation medicines used during the test).    FOLLOW UP: Our staff will call the number listed on your records 48-72 hours following your procedure to check on you and address any questions or concerns that you may have regarding the information given to you following your procedure. If we do not reach you, we will leave a message.  We will attempt to reach you two times.  During this call, we will ask if you have developed any symptoms of COVID 19. If you develop any symptoms (ie: fever, flu-like symptoms, shortness of breath, cough etc.) before then, please call [phone].  If you test positive for Covid 19 in the 2 weeks post procedure, please call and report this information to [place].    If any biopsies were taken you will be contacted by phone or by letter within the next 1-3 weeks.  Please call us at [phone] if you have not heard about the biopsies in 3 weeks.    SIGNATURES/CONFIDENTIALITY: You and/or your care partner have signed paperwork which will be entered into your electronic medical record.  These signatures attest to the fact that that the information above on your After Visit Summary  has been reviewed and is understood.  Full responsibility of the confidentiality of this discharge information lies with you and/or your care-partner.

## 2019-07-01 ENCOUNTER — Telehealth: Payer: Self-pay | Admitting: Gastroenterology

## 2019-07-01 ENCOUNTER — Telehealth: Payer: Self-pay

## 2019-07-01 NOTE — Telephone Encounter (Signed)
Dr. Havery Moros contacted patient this morning to discuss issues.  Thank you Dr. Havery Moros - I really appreciate it!

## 2019-07-01 NOTE — Telephone Encounter (Signed)
Called patient. She had a colonoscopy with me yesterday, she had 14 polyps removed, all small and removed with cold snare. When she got home she passed some clots and a small amount of red blood around 6PM. Nothing overnight. She passed a very scant amount of blood at 8AM this AM, not much of anything per her report, and nothing since. She has no pain. She feels fine at present. I think she probably passed old blood from burden of multiple polypectomy sites last night, I don't see any high risk lesions that were removed, although could have had some slow oozing from one of the sites for a period of time. Sounds like this has resolved and don't anticipate any significant bleeding moving forward. If she notes this worsens or recurs she will call me, otherwise reassured her I think this has probably stopped and was self limited mild bleeding. She agreed.

## 2019-07-01 NOTE — Telephone Encounter (Signed)
Patient had colonoscopy yesterday.  Several times yesterday she used the bathroom and passed brown liquid, red blood and several clots.  Today she has had some bleeding but no clots.  She has no fever or pain.  Please advise.  I told her that I would call to check on her this afternoon.  Thanks so much!

## 2019-07-02 ENCOUNTER — Telehealth: Payer: Self-pay | Admitting: *Deleted

## 2019-07-02 NOTE — Telephone Encounter (Signed)
  Follow up Call-  Call back number 06/30/2019  Post procedure Call Back phone  # [phone]  Permission to leave phone message Yes  Some recent data might be hidden   [facility]

## 2019-07-03 ENCOUNTER — Ambulatory Visit: Payer: Medicare Other

## 2019-07-03 ENCOUNTER — Ambulatory Visit
Admission: RE | Admit: 2019-07-03 | Discharge: 2019-07-03 | Disposition: A | Discharge status: Home / Self Care | Payer: Medicare Other | Source: Ambulatory Visit | Attending: Family Medicine | Admitting: Family Medicine

## 2019-07-03 DIAGNOSIS — Z780 Asymptomatic menopausal state: Secondary | ICD-10-CM | POA: Diagnosis not present

## 2019-07-03 DIAGNOSIS — Z1231 Encounter for screening mammogram for malignant neoplasm of breast: Secondary | ICD-10-CM | POA: Insufficient documentation

## 2019-07-03 DIAGNOSIS — M8580 Other specified disorders of bone density and structure, unspecified site: Secondary | ICD-10-CM | POA: Diagnosis not present

## 2019-07-03 DIAGNOSIS — Z13820 Encounter for screening for osteoporosis: Secondary | ICD-10-CM | POA: Diagnosis not present

## 2019-07-03 DIAGNOSIS — E119 Type 2 diabetes mellitus without complications: Secondary | ICD-10-CM | POA: Diagnosis not present

## 2019-07-07 ENCOUNTER — Ambulatory Visit: Payer: Medicare Other

## 2019-07-10 ENCOUNTER — Other Ambulatory Visit: Payer: Self-pay | Admitting: Family Medicine

## 2019-07-10 ENCOUNTER — Ambulatory Visit: Payer: Medicare Other

## 2019-07-10 ENCOUNTER — Other Ambulatory Visit: Payer: Self-pay

## 2019-07-10 DIAGNOSIS — M6281 Muscle weakness (generalized): Secondary | ICD-10-CM

## 2019-07-10 DIAGNOSIS — M542 Cervicalgia: Secondary | ICD-10-CM

## 2019-07-10 DIAGNOSIS — M25611 Stiffness of right shoulder, not elsewhere classified: Secondary | ICD-10-CM

## 2019-07-10 DIAGNOSIS — M5412 Radiculopathy, cervical region: Secondary | ICD-10-CM

## 2019-07-10 DIAGNOSIS — M25511 Pain in right shoulder: Secondary | ICD-10-CM

## 2019-07-10 NOTE — Therapy (Signed)
[place] MAIN [facility] SERVICES [address] [place], [place], [address] Phone: [phone]   Fax:  [phone]  Physical Therapy Treatment  Patient Details  Name: Morgan [name] MRN: [id number] Date of Birth: Aug 23, 1941 Referring Provider (PT): Dr. Mayer Camel   Encounter Date: 07/10/2019  PT End of Session - 07/10/19 0851    Visit Number  [id number]    Number of Visits  19    Date for PT Re-Evaluation  07/31/19    Authorization Type  shoulder goals updated 05/22/19, neck goals created 06/05/19    PT Start Time  0847    PT Stop Time  0930    PT Time Calculation (min)  43 min    Activity Tolerance  Patient tolerated treatment well    Behavior During Therapy  [facility] for tasks assessed/performed       Past Medical History:  Diagnosis Date  . Angina   . Arthritis   . Atrial fibrillation (HCC)    ASPIRIN FOR BLOOD THINNER  . Atypical mole 09/22/2016  . Bulging discs    lumbar   . Cancer ([place])    melanoma  . Complication of anesthesia    pt states has choking sensation with ET tube   . Coronary artery disease   . Depression   . Diabetes mellitus    diet controlled/on meds  . Fibromyalgia   . GERD (gastroesophageal reflux disease)   . H/O hiatal hernia   . Headache(784.0)    "recurring"  . High cholesterol   . Hyperlipidemia   . Hypertension   . 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 Migraines    "til ~ 1980"  . PONV (postoperative nausea and vomiting)   . Restless leg syndrome   . Sciatic nerve pain    "from pinched nerve"  . Sleep apnea    uses CPAP  . Stroke [facility]) 2014   no deficits  . Weakness of right side of body    "I've had PT for it; they don't know what it's from".  CORTISONE INJECTION INTO BACK 08/30/12    Past Surgical History:  Procedure Laterality Date  . [place] STUDY N/A 12/29/2015   Procedure: [place] STUDY;  Surgeon: Manus Gunning, MD;  Location: [facility];  Service: Gastroenterology;  Laterality: N/A;  . CARPAL  TUNNEL RELEASE Left 07/02/2015   Procedure: CARPAL TUNNEL RELEASE;  Surgeon: Frederik Pear, MD;  Location: [place];  Service: Orthopedics;  Laterality: Left;  . CATARACT EXTRACTION, BILATERAL Bilateral    Oct and Nov 2017  . CORONARY ANGIOPLASTY WITH STENT PLACEMENT  06/2011   "1"  . CORONARY ARTERY BYPASS GRAFT  2005   CABG X 2  . DILATION AND CURETTAGE OF UTERUS     "more than once"  . ELBOW ARTHROSCOPY Left 07/02/2015   Procedure: ARTHROSCOPY LEFT ELBOW WITH DEBRIDEMENT AND REMOVAL LOOSE BODY;  Surgeon: Frederik Pear, MD;  Location: [place];  Service: Orthopedics;  Laterality: Left;  . ESOPHAGEAL MANOMETRY N/A 12/29/2015   Procedure: ESOPHAGEAL MANOMETRY (EM);  Surgeon: Manus Gunning, MD;  Location: [facility];  Service: Gastroenterology;  Laterality: N/A;  . FRACTURE SURGERY  ~ 2005   nose  . KNEE ARTHROSCOPY  09/04/2012   Procedure: ARTHROSCOPY KNEE;  Surgeon: Gearlean Alf, MD;  Location: [facility];  Service: Orthopedics;  Laterality: Right;  right knee arthroscopy with medial and lateral meniscus debridement  . MELANOMA EXCISION Right 10/13/2016   right side  of neck  . MOUTH SURGERY  2004   "bone replacement; had cadavear bones put in; face was collapsing"  . NASAL SEPTUM SURGERY  ~ 1986  . TOTAL KNEE ARTHROPLASTY Right 07/07/2013   Procedure: RIGHT TOTAL KNEE ARTHROPLASTY;  Surgeon: Gearlean Alf, MD;  Location: [facility];  Service: Orthopedics;  Laterality: Right;    There were no vitals filed for this visit.  Subjective Assessment - 07/10/19 0850    Subjective  Pt reports N&T is relatively unchanged througout RUE. She recently had an MRI on 06/26/19 and was told that she has cervical disc bulging and the next step is to get an injection to see if symptoms will improve. No questions or concerns at this time.    Pertinent History  On 08/03/18 pt was carrying a folding table and fell and right arm was under the handle got stuck between the door  and the table. She landed on her R side. Tried to get up and had to pull at her arm. Pt had immediate pain in her right shoulder. She waited until Monday and went to see Dr. Edilia Bo her PCP. He took radiographs but didn't find any fractures. She continued to have pain so she called Dr. Damita Dunnings office, her orthopedist, and saw him that same week. He took additional radiographs and found a hairline fracture in her humeral shaft and a greater tuberosity fracture per patient report. She states that she is not healing well and has recurrent plain film radiographs. She is still wearing a sling and was encouraged not to perform any AROM of her right shoulder. Since her last visit with Dr. Mayer Camel on 09/20/18 she has had an additional fall and landed on her R shoulder. She reports additional bruising and pain in her R shoulder. She has a follow-up appt scheduled with her orthopedist for 10/15/18. 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 ROS negative for red flags. 06/05/19: Pt presents to physical therapy with reports of intermittent RUE numbness and tingling that began about 4 weeks ago. The numbness and tingling begins at the shoulder and extends down into the elbow. On days when it is more intense, it begins in the cervical spine. After about 5 seconds, numbness tingling subsides, and she experiences min hyperalgesia. Pt was told by physician that if numbness and tingling does not improve with conservative therapy after 3 months an MRI will be ordered. She is currently being seen in physical therapy for R shoulder pain due to recovery post greater tubercle fracture.    Limitations  Lifting    Diagnostic tests  Plain film radiographs with decreased healing per pt report    Patient Stated Goals  Improve ability to use RUE and decrease pain    Currently in Pain?  No/denies          TREATMENT    Therapeutic Exercise UBE 2.5 min forward/2.5 min backward for warm-up during  history Bilateral standing rows with green theraband x15 each; tactile and verbal cueing needed for form  R shoulder extension with green theraband x15 each; tactile and verbal cueing needed for form  Standing RUE ER with red theraband x12; max tactile and verbal cueing needed for form  Prone I's T's 2#, Y's with manual resistance x10 each  Sidelying R shoulder abduction 2# x12 each;      Mechanical Cervical Traction   Intermittent, 30 degree pull angle, 20# 30s hold time, 13# 10s rest time x10 minutes total.  Pt denies any numbness/tingling at end of traction. *with bolster under knees for comfort     Trigger Point Dry Needling (TDN) Education performed with patient regarding potential benefit of TDN. Reviewed precautions and risks with patient. Extensive time spent with pt to ensure full understanding of TDN risks. Pt provided verbal consent to treatment. TDN performed to R cervical multifidi with 0.30 x 50 single needle placements with local twitch response (LTR). Pistoning technique utilized.     Pt educated throughout session about proper posture and technique with exercises. Improved exercise technique, movement at target joints, use of target muscles after min to mod verbal cues.     Pt demonstrates fair motivation during session. Pt tolerated treatment well today without reported c/o inc pain and without intermittent rest breaks needed.  She continues to require mod-max verbal and tactile cueing throughout theraband and resisted exercises for proper technique and form. She has notable lack in range of motion for R shoulder ER.  Pt demonstrates significant weakness in the R lower trap tolerating only min manual resistance during prone y's. Continued mechanical cervical traction today as pt tolerated well at last treatment and reported feeling better and able to do more daily activities the following day. She reports resolution of her RUE numbness/tingling today following traction.  Encouraged pt to continue with her HEP and therapist will continue to progress her strength/ROM. Pt will benefit from PT services to address deficits in strength, mobility, and pain in order to return to full function at home.         PT Education - 07/10/19 0850    Education Details  Exercise technique    Person(s) Educated  Patient    Methods  Explanation;Demonstration;Tactile cues;Verbal cues    Comprehension  Verbal cues required;Returned demonstration;Verbalized understanding;Tactile cues required       PT Short Term Goals - 05/22/19 1025      PT SHORT TERM GOAL #1   Title  Pt will be independent with HEP in order to improve strength and decrease pain in order to improve pain-free function at home.    Time  4    Period  Weeks    Status  Achieved        PT Long Term Goals - 06/05/19 1117      PT LONG TERM GOAL #1   Title  Pt will decrease quick DASH score to below 30% in order to demonstrate clinically significant reduction in disability.    Baseline  09/30/18: 84.1%; 10/30/18: 56.8%; 11/27/18: 45.45%; 12/23/18: 40.9%; 01/21/19: 29.5%, 6/4: 43%; 05/22/19: 43.2%    Time  8    Period  Weeks    Status  Partially Met    Target Date  07/31/19      PT LONG TERM GOAL #2   Title  Pt will decrease worst shoulder pain as reported on NPRS to below 4/10 in order to demonstrate clinically significant reduction in pain.    Baseline  09/30/18: worst: 9/10; 10/30/18: 6/10; 11/27/18: 4/10; 12/23/18: 5/10 (pain is less frequent); 01/21/19: 5/10 (pain is less frequent); 03/19/19: 4/10; 6/4: 5/10; 05/22/19: 4/10 (decreased frequency of pain as well);    Time  8    Period  Weeks    Status  Partially Met    Target Date  07/31/19      PT LONG TERM GOAL #3   Title  Pt will improve PROM of R shoulder to within 10 degrees of R shoulder in order to improve functional use of R  shoulder for ADLs/IADLs.    Baseline  R/L 129/150 Shoulder flexion, 100/165 Shoulder abduction, 50/93 Shoulder external rotation,  50/70 Shoulder internal rotation; 10/30/18: R/L 140/150 Shoulder flexion, 132/165 Shoulder abduction, 82/93 Shoulder external rotation, 70/70 Shoulder internal rotation; 12/23/18:  R/L 155/150 Shoulder flexion, 134/165 Shoulder abduction, 89/93 Shoulder external rotation, 70/70 Shoulder internal rotation, 01/21/19: R/L 154/150 Shoulder flexion, 136/165 Shoulder abduction, 85/93 Shoulder external rotation, 70/70 Shoulder internal rotation; 03/19/19: Unable to test over telemedicine 6/4: R: 128 flexion, 121 abduction, 78 shoulder ER, 70 shoulder IR; 05/22/19: R: 148 flexion (154 L side), 145 abduction (166 L side), 85 shoulder ER, 76 shoulder IR    Time  8    Period  Weeks    Status  Partially Met    Target Date  07/31/19      PT LONG TERM GOAL #4   Title  Pt will improve pain-free R shoulder strength for flexion and abduction so that it is symmetrical with L shoulder in order to return to full function at home    Baseline  12/23/18: R/L 4-/4+ Shoulder flexion, 4/4+ Shoulder abduction, both painful on R side; 01/21/19: R/L 4/4+ Shoulder flexion, 4/4+ Shoulder abduction, both painful on R side; 03/19/19: Unable to test over telemedicine 6/4: 4-/4; 05/22/19: R/L: Flexion: 4/4+ (no pain bilateral) Abduction: 4-/4 (painful on R side, no pain on L side);    Time  8    Period  Weeks    Status  Partially Met    Target Date  07/31/19      PT LONG TERM GOAL #5   Title  Pt will demonstrate decrease in NDI by at least 19% in order to demonstrate clinically significant reduction in disability related to neck injury/pain    Baseline  06/05/19: 36%    Time  8    Period  Weeks    Status  New    Target Date  07/31/19      Additional Long Term Goals   Additional Long Term Goals  --      PT LONG TERM GOAL #6   Title  --    Baseline  --    Time  --    Period  --    Status  --      PT LONG TERM GOAL #7   Title  --    Baseline  --    Time  --    Period  --    Status  --    Target Date  --            Plan  - 07/10/19 6384    Clinical Impression Statement  Pt demonstrates fair motivation during session. Pt tolerated treatment well today without reported c/o inc pain and without intermittent rest breaks needed.  She continues to require mod-max verbal and tactile cueing throughout theraband and resisted exercises for proper technique and form. She has notable lack in range of motion for R shoulder ER.  Pt demonstrates significant weakness in the R lower trap tolerating only min manual resistance during prone y's. Continued mechanical cervical traction today as pt tolerated well at last treatment and reported feeling better and able to do more daily activities the following day. Encouraged pt to continue with her HEP and therapist will continue to progress her strength/ROM. Pt will benefit from PT services to address deficits in strength, mobility, and pain in order to return to full function at home.    Personal Factors and Comorbidities  Age;Comorbidity 2;Past/Current  Experience    Comorbidities  afib, CAD, fibromyalgia, DM, depression    Examination-Activity Limitations  Bathing;Carry    Examination-Participation Restrictions  Cleaning;Community Activity    Stability/Clinical Decision Making  Unstable/Unpredictable    Rehab Potential  Good    PT Frequency  2x / week    PT Duration  8 weeks    PT Treatment/Interventions  ADLs/Self Care Home Management;Aquatic Therapy;Canalith Repostioning;Cryotherapy;Electrical Stimulation;Iontophoresis 26m/ml Dexamethasone;Moist Heat;Traction;Ultrasound;Contrast Bath;DME Instruction;Gait training;Stair training;Functional mobility training;Therapeutic activities;Therapeutic exercise;Balance training;Neuromuscular re-education;Patient/family education;Manual techniques;Passive range of motion;Dry needling;Splinting;Taping;Vestibular;Spinal Manipulations    PT Next Visit Plan  Progress AAROM/AROM/strengthening of R shoulder as tolerated without pain, cervical traction,  cervical mobilizations, nerve glides    PT Home Exercise Plan  Cervical retractions, Scapular retractions, R upper trap stretch, supine canes for AAROM flexion with hands together progressing to AROM supine flexion, AROM sidelying shoulder abduction, AROM sidelying shoulder ER, seated pulleys for flexion and scaption ([id number])    Consulted and Agree with Plan of Care  Patient       Patient will benefit from skilled therapeutic intervention in order to improve the following deficits and impairments:  Decreased strength, Decreased range of motion, Impaired UE functional use, Pain  Visit Diagnosis: Cervicalgia  Radiculopathy, cervical region  Acute pain of right shoulder  Stiffness of right shoulder, not elsewhere classified  Muscle weakness (generalized)     Problem List Patient Active Problem List   Diagnosis Date Noted  . Chronic diarrhea 01/14/2019  . Malignant melanoma ([place] 11/23/2016  . Urge incontinence 11/23/2016  . Atypical mole 09/22/2016  . Slurring of speech 05/12/2016  . Traumatic arthritis elbow 05/04/2015  . Left foot pain 12/31/2014  . Severe sprain of left ankle 12/31/2014  . Osteopenia 09/15/2014  . History of TIA (transient ischemic attack) 05/19/2014  . Fracture of coronoid process of ulna, left, closed 04/25/2014  . Ingrown toenail 01/15/2014  . Obesity (BMI 30-39.9) 10/08/2013  . OA (osteoarthritis) of knee 07/07/2013  . Chronic low back pain 05/09/2013  . Obstructive sleep apnea 05/09/2013  . GERD (gastroesophageal reflux disease) 05/09/2013  . Hiatal hernia 05/09/2013  . Fibromyalgia syndrome 05/09/2013  . Depression, major, in partial remission ([place] 05/09/2013  . RLS (restless legs syndrome) 05/09/2013  . Diabetes mellitus with no complication ([place] 054/27/0623 . High cholesterol 05/09/2013  . 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 Family history of colon cancer 05/09/2013  . Allergic rhinitis 05/09/2013  . Lateral meniscal tear 09/04/2012  .  CAD (coronary artery disease) 09/27/2011  . HTN (hypertension) 09/27/2011  . Paroxysmal A-fib ([facility] 09/27/2011   This entire session was performed under direct supervision and direction of a licensed therapist/therapist assistant . I have personally read, edited and approve of the note as written.   NElmyra RicksTurcotte SPT JPhillips GroutPT, DPT, GCS  Huprich,Jason 07/10/2019, 4:27 PM  [place] [facility] [address] [address]Phone: [phone]  Fax:  [phone] Name: SDaurice OvandoMRN: [id number] of Birth: [birth year]

## 2019-07-11 NOTE — Telephone Encounter (Signed)
Last office visit 02/25/2019 for HTN & Diarrhea.  Last refilled 06/03/2019 for #60 with no refills.  CPE scheduled for 01/27/2020.

## 2019-07-15 ENCOUNTER — Other Ambulatory Visit: Payer: Self-pay

## 2019-07-15 ENCOUNTER — Ambulatory Visit: Payer: Medicare Other | Attending: Orthopedic Surgery

## 2019-07-15 ENCOUNTER — Telehealth: Payer: Self-pay

## 2019-07-15 DIAGNOSIS — M25511 Pain in right shoulder: Secondary | ICD-10-CM | POA: Diagnosis present

## 2019-07-15 DIAGNOSIS — M6281 Muscle weakness (generalized): Secondary | ICD-10-CM | POA: Insufficient documentation

## 2019-07-15 DIAGNOSIS — D123 Benign neoplasm of transverse colon: Secondary | ICD-10-CM

## 2019-07-15 DIAGNOSIS — M25611 Stiffness of right shoulder, not elsewhere classified: Secondary | ICD-10-CM | POA: Diagnosis present

## 2019-07-15 DIAGNOSIS — M542 Cervicalgia: Secondary | ICD-10-CM | POA: Diagnosis not present

## 2019-07-15 DIAGNOSIS — M5412 Radiculopathy, cervical region: Secondary | ICD-10-CM | POA: Diagnosis present

## 2019-07-15 DIAGNOSIS — K219 Gastro-esophageal reflux disease without esophagitis: Secondary | ICD-10-CM

## 2019-07-15 NOTE — Therapy (Signed)
[place] MAIN [facility] SERVICES [address] [place], [place], [address] Phone: [phone]   Fax:  [phone]  Physical Therapy Treatment  Patient Details  Name: Morgan [name] MRN: [id number] Date of Birth: February 17, 1941 Referring Provider (PT): Dr. Mayer Camel   Encounter Date: 07/15/2019  PT End of Session - 07/15/19 1637    Visit Number  48    Number of Visits  81    Date for PT Re-Evaluation  07/31/19    Authorization Type  shoulder goals updated 05/22/19, neck goals created 06/05/19    PT Start Time  1600    PT Stop Time  1645    PT Time Calculation (min)  45 min    Activity Tolerance  Patient tolerated treatment well    Behavior During Therapy  [facility] for tasks assessed/performed       Past Medical History:  Diagnosis Date  . Angina   . Arthritis   . Atrial fibrillation (HCC)    ASPIRIN FOR BLOOD THINNER  . Atypical mole 09/22/2016  . Bulging discs    lumbar   . Cancer ([place])    melanoma  . Complication of anesthesia    pt states has choking sensation with ET tube   . Coronary artery disease   . Depression   . Diabetes mellitus    diet controlled/on meds  . Fibromyalgia   . GERD (gastroesophageal reflux disease)   . H/O hiatal hernia   . Headache(784.0)    "recurring"  . High cholesterol   . Hyperlipidemia   . Hypertension   . 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 Migraines    "til ~ 1980"  . PONV (postoperative nausea and vomiting)   . Restless leg syndrome   . Sciatic nerve pain    "from pinched nerve"  . Sleep apnea    uses CPAP  . Stroke [facility]) 2014   no deficits  . Weakness of right side of body    "I've had PT for it; they don't know what it's from".  CORTISONE INJECTION INTO BACK 08/30/12    Past Surgical History:  Procedure Laterality Date  . [place] STUDY N/A 12/29/2015   Procedure: [place] STUDY;  Surgeon: Manus Gunning, MD;  Location: [facility];  Service: Gastroenterology;  Laterality: N/A;  . CARPAL  TUNNEL RELEASE Left 07/02/2015   Procedure: CARPAL TUNNEL RELEASE;  Surgeon: Frederik Pear, MD;  Location: [place];  Service: Orthopedics;  Laterality: Left;  . CATARACT EXTRACTION, BILATERAL Bilateral    Oct and Nov 2017  . CORONARY ANGIOPLASTY WITH STENT PLACEMENT  06/2011   "1"  . CORONARY ARTERY BYPASS GRAFT  2005   CABG X 2  . DILATION AND CURETTAGE OF UTERUS     "more than once"  . ELBOW ARTHROSCOPY Left 07/02/2015   Procedure: ARTHROSCOPY LEFT ELBOW WITH DEBRIDEMENT AND REMOVAL LOOSE BODY;  Surgeon: Frederik Pear, MD;  Location: [place];  Service: Orthopedics;  Laterality: Left;  . ESOPHAGEAL MANOMETRY N/A 12/29/2015   Procedure: ESOPHAGEAL MANOMETRY (EM);  Surgeon: Manus Gunning, MD;  Location: [facility];  Service: Gastroenterology;  Laterality: N/A;  . FRACTURE SURGERY  ~ 2005   nose  . KNEE ARTHROSCOPY  09/04/2012   Procedure: ARTHROSCOPY KNEE;  Surgeon: Gearlean Alf, MD;  Location: [facility];  Service: Orthopedics;  Laterality: Right;  right knee arthroscopy with medial and lateral meniscus debridement  . MELANOMA EXCISION Right 10/13/2016   right side  of neck  . MOUTH SURGERY  2004   "bone replacement; had cadavear bones put in; face was collapsing"  . NASAL SEPTUM SURGERY  ~ 1986  . TOTAL KNEE ARTHROPLASTY Right 07/07/2013   Procedure: RIGHT TOTAL KNEE ARTHROPLASTY;  Surgeon: Gearlean Alf, MD;  Location: [facility];  Service: Orthopedics;  Laterality: Right;    There were no vitals filed for this visit.  Subjective Assessment - 07/15/19 1557    Subjective  Pt reports that she is doing well today. She has continued with intermittent numbness and tingling radiating down to the posterior R forearm. However she did experience 2 days of relief following the last treatment. Pt has a consult for spinal injections on Friday. No reported pain upon arrival in neck or R shoulder and no specific questions or concerns.    Pertinent History  On  08/03/18 pt was carrying a folding table and fell and right arm was under the handle got stuck between the door and the table. She landed on her R side. Tried to get up and had to pull at her arm. Pt had immediate pain in her right shoulder. She waited until Monday and went to see Dr. Edilia Bo her PCP. He took radiographs but didn't find any fractures. She continued to have pain so she called Dr. Damita Dunnings office, her orthopedist, and saw him that same week. He took additional radiographs and found a hairline fracture in her humeral shaft and a greater tuberosity fracture per patient report. She states that she is not healing well and has recurrent plain film radiographs. She is still wearing a sling and was encouraged not to perform any AROM of her right shoulder. Since her last visit with Dr. Mayer Camel on 09/20/18 she has had an additional fall and landed on her R shoulder. She reports additional bruising and pain in her R shoulder. She has a follow-up appt scheduled with her orthopedist for 10/15/18. 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 ROS negative for red flags. 06/05/19: Pt presents to physical therapy with reports of intermittent RUE numbness and tingling that began about 4 weeks ago. The numbness and tingling begins at the shoulder and extends down into the elbow. On days when it is more intense, it begins in the cervical spine. After about 5 seconds, numbness tingling subsides, and she experiences min hyperalgesia. Pt was told by physician that if numbness and tingling does not improve with conservative therapy after 3 months an MRI will be ordered. She is currently being seen in physical therapy for R shoulder pain due to recovery post greater tubercle fracture.    Limitations  Lifting    Diagnostic tests  Plain film radiographs with decreased healing per pt report    Patient Stated Goals  Improve ability to use RUE and decrease pain    Currently in Pain?  No/denies           TREATMENT   Manual Therapy  UBE 39mn forward/277m backward for warm-up during history; MHP to cervical paraspinals while continuing with interval history; R upper trap, levator and scalene stretches 30s x 2 each; Repeated cervical retraction with overpressure by therapist 5s hold 2 x 10; R median nerve glides x 10;   Mechanical Cervical Traction  Intermittent, 30 degree pull angle, 23# 30s hold time, 15# 10s rest time x 10 minutes total. Pt denies any numbness/tingling at end of traction.   Trigger Point Dry Needling (TDN), unbilled Education performed with  patient regarding potential benefit of TDN. Reviewed precautions and risks with patient. Extensive time spent with pt to ensure full understanding of TDN risks. Pt provided verbal consent to treatment. TDN performed to R upper trap anterior approach with 2, 0.30 x 50 single needle placements with local twitch response (LTR). Additionally performed TDN to R cervical multifidi and R subocciptials with 1, 0.30 x 50 single needle placements each with LTR. Pistoning technique utilized.     Pt educated throughout session about proper posture and technique with exercises. Improved exercise technique, movement at target joints, use of target muscles after min to mod verbal cues.   Pt demonstrates good motivation during session.She reports some reproduction of radiating symptoms with TDN to R cervical multifidi. Decreasing symptoms with median nerve glides. Pt denies any numbness/tingling following mechanical traction. Pt encouraged to follow-up with MD this Friday regarding spinal injections . Encouraged pt to continue with her HEP and therapist will continue to progress her strength/ROM. Pt will benefit from PT services to address deficits in strength, mobility, and pain in order to return to full function at home.                       PT Short Term Goals - 05/22/19 1025      PT SHORT TERM GOAL #1    Title  Pt will be independent with HEP in order to improve strength and decrease pain in order to improve pain-free function at home.    Time  4    Period  Weeks    Status  Achieved        PT Long Term Goals - 06/05/19 1117      PT LONG TERM GOAL #1   Title  Pt will decrease quick DASH score to below 30% in order to demonstrate clinically significant reduction in disability.    Baseline  09/30/18: 84.1%; 10/30/18: 56.8%; 11/27/18: 45.45%; 12/23/18: 40.9%; 01/21/19: 29.5%, 6/4: 43%; 05/22/19: 43.2%    Time  8    Period  Weeks    Status  Partially Met    Target Date  07/31/19      PT LONG TERM GOAL #2   Title  Pt will decrease worst shoulder pain as reported on NPRS to below 4/10 in order to demonstrate clinically significant reduction in pain.    Baseline  09/30/18: worst: 9/10; 10/30/18: 6/10; 11/27/18: 4/10; 12/23/18: 5/10 (pain is less frequent); 01/21/19: 5/10 (pain is less frequent); 03/19/19: 4/10; 6/4: 5/10; 05/22/19: 4/10 (decreased frequency of pain as well);    Time  8    Period  Weeks    Status  Partially Met    Target Date  07/31/19      PT LONG TERM GOAL #3   Title  Pt will improve PROM of R shoulder to within 10 degrees of R shoulder in order to improve functional use of R shoulder for ADLs/IADLs.    Baseline  R/L 129/150 Shoulder flexion, 100/165 Shoulder abduction, 50/93 Shoulder external rotation, 50/70 Shoulder internal rotation; 10/30/18: R/L 140/150 Shoulder flexion, 132/165 Shoulder abduction, 82/93 Shoulder external rotation, 70/70 Shoulder internal rotation; 12/23/18:  R/L 155/150 Shoulder flexion, 134/165 Shoulder abduction, 89/93 Shoulder external rotation, 70/70 Shoulder internal rotation, 01/21/19: R/L 154/150 Shoulder flexion, 136/165 Shoulder abduction, 85/93 Shoulder external rotation, 70/70 Shoulder internal rotation; 03/19/19: Unable to test over telemedicine 6/4: R: 128 flexion, 121 abduction, 78 shoulder ER, 70 shoulder IR; 05/22/19: R: 148 flexion (154 L side), 145  abduction (166  L side), 85 shoulder ER, 76 shoulder IR    Time  8    Period  Weeks    Status  Partially Met    Target Date  07/31/19      PT LONG TERM GOAL #4   Title  Pt will improve pain-free R shoulder strength for flexion and abduction so that it is symmetrical with L shoulder in order to return to full function at home    Baseline  12/23/18: R/L 4-/4+ Shoulder flexion, 4/4+ Shoulder abduction, both painful on R side; 01/21/19: R/L 4/4+ Shoulder flexion, 4/4+ Shoulder abduction, both painful on R side; 03/19/19: Unable to test over telemedicine 6/4: 4-/4; 05/22/19: R/L: Flexion: 4/4+ (no pain bilateral) Abduction: 4-/4 (painful on R side, no pain on L side);    Time  8    Period  Weeks    Status  Partially Met    Target Date  07/31/19      PT LONG TERM GOAL #5   Title  Pt will demonstrate decrease in NDI by at least 19% in order to demonstrate clinically significant reduction in disability related to neck injury/pain    Baseline  06/05/19: 36%    Time  8    Period  Weeks    Status  New    Target Date  07/31/19      Additional Long Term Goals   Additional Long Term Goals  --      PT LONG TERM GOAL #6   Title  --    Baseline  --    Time  --    Period  --    Status  --      PT LONG TERM GOAL #7   Title  --    Baseline  --    Time  --    Period  --    Status  --    Target Date  --            Plan - 07/15/19 1637    Clinical Impression Statement  Pt demonstrates good motivation during session. She reports some reproduction of radiating symptoms with TDN to R cervical multifidi. Decreasing symptoms with median nerve glides. Pt denies any numbness/tingling following mechanical traction. Pt encouraged to follow-up with MD this Friday regarding spinal injections . Encouraged pt to continue with her HEP and therapist will continue to progress her strength/ROM. Pt will benefit from PT services to address deficits in strength, mobility, and pain in order to return to full function at  home.    Personal Factors and Comorbidities  Age;Comorbidity 2;Past/Current Experience    Comorbidities  afib, CAD, fibromyalgia, DM, depression    Examination-Activity Limitations  Bathing;Carry    Examination-Participation Restrictions  Cleaning;Community Activity    Stability/Clinical Decision Making  Unstable/Unpredictable    Rehab Potential  Good    PT Frequency  2x / week    PT Duration  8 weeks    PT Treatment/Interventions  ADLs/Self Care Home Management;Aquatic Therapy;Canalith Repostioning;Cryotherapy;Electrical Stimulation;Iontophoresis '4mg'$ /ml Dexamethasone;Moist Heat;Traction;Ultrasound;Contrast Bath;DME Instruction;Gait training;Stair training;Functional mobility training;Therapeutic activities;Therapeutic exercise;Balance training;Neuromuscular re-education;Patient/family education;Manual techniques;Passive range of motion;Dry needling;Splinting;Taping;Vestibular;Spinal Manipulations    PT Next Visit Plan  Outcome measures, goals, progress note, Progress AAROM/AROM/strengthening of R shoulder as tolerated without pain, cervical traction, cervical mobilizations, nerve glides    PT Home Exercise Plan  Cervical retractions, Scapular retractions, R upper trap stretch, supine canes for AAROM flexion with hands together progressing to AROM supine flexion, AROM sidelying shoulder abduction, AROM sidelying shoulder  ER, seated pulleys for flexion and scaption ([id number])    Consulted and Agree with Plan of Care  Patient       Patient will benefit from skilled therapeutic intervention in order to improve the following deficits and impairments:  Decreased strength, Decreased range of motion, Impaired UE functional use, Pain  Visit Diagnosis: Cervicalgia  Radiculopathy, cervical region     Problem List Patient Active Problem List   Diagnosis Date Noted  . Chronic diarrhea 01/14/2019  . Malignant melanoma ([place]) 11/23/2016  . Urge incontinence 11/23/2016  . Atypical mole 09/22/2016  .  Slurring of speech 05/12/2016  . Traumatic arthritis elbow 05/04/2015  . Left foot pain 12/31/2014  . Severe sprain of left ankle 12/31/2014  . Osteopenia 09/15/2014  . History of TIA (transient ischemic attack) 05/19/2014  . Fracture of coronoid process of ulna, left, closed 04/25/2014  . Ingrown toenail 01/15/2014  . Obesity (BMI 30-39.9) 10/08/2013  . OA (osteoarthritis) of knee 07/07/2013  . Chronic low back pain 05/09/2013  . Obstructive sleep apnea 05/09/2013  . GERD (gastroesophageal reflux disease) 05/09/2013  . Hiatal hernia 05/09/2013  . Fibromyalgia syndrome 05/09/2013  . Depression, major, in partial remission ([place]) 05/09/2013  . RLS (restless legs syndrome) 05/09/2013  . Diabetes mellitus with no complication ([place]) 79/55/8316  . High cholesterol 05/09/2013  . 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 Family history of colon cancer 05/09/2013  . Allergic rhinitis 05/09/2013  . Lateral meniscal tear 09/04/2012  . CAD (coronary artery disease) 09/27/2011  . HTN (hypertension) 09/27/2011  . Paroxysmal A-fib ([place]) 09/27/2011   Phillips Grout PT, DPT, GCS  Greysen Devino 07/16/2019, 1:59 PM  [place] MAIN [facility] SERVICES [address] [place], [place], [address] Phone: [phone]   Fax:  [phone]  Name: Morgan [name] MRN: [id number] Date of Birth: 30-Aug-1941

## 2019-07-15 NOTE — Telephone Encounter (Signed)
Nurses have left messages to call back last week and today, to give results and schedule an Esophageal Manometry. Pt has My Chart but is only read occasionally

## 2019-07-16 ENCOUNTER — Other Ambulatory Visit: Payer: Self-pay | Admitting: Gastroenterology

## 2019-07-17 ENCOUNTER — Ambulatory Visit: Payer: Medicare Other

## 2019-07-17 ENCOUNTER — Other Ambulatory Visit: Payer: Self-pay

## 2019-07-17 ENCOUNTER — Ambulatory Visit (INDEPENDENT_AMBULATORY_CARE_PROVIDER_SITE_OTHER): Payer: Medicare Other

## 2019-07-17 DIAGNOSIS — M5412 Radiculopathy, cervical region: Secondary | ICD-10-CM

## 2019-07-17 DIAGNOSIS — Z23 Encounter for immunization: Secondary | ICD-10-CM

## 2019-07-17 DIAGNOSIS — M25511 Pain in right shoulder: Secondary | ICD-10-CM

## 2019-07-17 DIAGNOSIS — M542 Cervicalgia: Secondary | ICD-10-CM

## 2019-07-17 DIAGNOSIS — M25611 Stiffness of right shoulder, not elsewhere classified: Secondary | ICD-10-CM

## 2019-07-17 NOTE — Therapy (Signed)
[place] MAIN [facility] SERVICES [address] [place], [place], [address] Phone: [phone]   Fax:  [phone]  Physical Therapy Treatment  Patient Details  Name: Morgan [name] MRN: [id number] Date of Birth: Sep 28, 1941 Referring Provider (PT): Dr. Mayer Camel   Encounter Date: 07/17/2019  PT End of Session - 07/17/19 1356    Visit Number  27    Number of Visits  81    Date for PT Re-Evaluation  07/31/19    Authorization Type  shoulder goals updated 05/22/19, neck goals created 06/05/19    PT Start Time  5284    PT Stop Time  1430    PT Time Calculation (min)  41 min    Activity Tolerance  Patient tolerated treatment well    Behavior During Therapy  [facility] for tasks assessed/performed       Past Medical History:  Diagnosis Date  . Angina   . Arthritis   . Atrial fibrillation (HCC)    ASPIRIN FOR BLOOD THINNER  . Atypical mole 09/22/2016  . Bulging discs    lumbar   . Cancer ([place])    melanoma  . Complication of anesthesia    pt states has choking sensation with ET tube   . Coronary artery disease   . Depression   . Diabetes mellitus    diet controlled/on meds  . Fibromyalgia   . GERD (gastroesophageal reflux disease)   . H/O hiatal hernia   . Headache(784.0)    "recurring"  . High cholesterol   . Hyperlipidemia   . Hypertension   . 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 Migraines    "til ~ 1980"  . PONV (postoperative nausea and vomiting)   . Restless leg syndrome   . Sciatic nerve pain    "from pinched nerve"  . Sleep apnea    uses CPAP  . Stroke [facility]) 2014   no deficits  . Weakness of right side of body    "I've had PT for it; they don't know what it's from".  CORTISONE INJECTION INTO BACK 08/30/12    Past Surgical History:  Procedure Laterality Date  . [place] STUDY N/A 12/29/2015   Procedure: [place] STUDY;  Surgeon: Manus Gunning, MD;  Location: [facility];  Service: Gastroenterology;  Laterality: N/A;  . CARPAL  TUNNEL RELEASE Left 07/02/2015   Procedure: CARPAL TUNNEL RELEASE;  Surgeon: Frederik Pear, MD;  Location: [place];  Service: Orthopedics;  Laterality: Left;  . CATARACT EXTRACTION, BILATERAL Bilateral    Oct and Nov 2017  . CORONARY ANGIOPLASTY WITH STENT PLACEMENT  06/2011   "1"  . CORONARY ARTERY BYPASS GRAFT  2005   CABG X 2  . DILATION AND CURETTAGE OF UTERUS     "more than once"  . ELBOW ARTHROSCOPY Left 07/02/2015   Procedure: ARTHROSCOPY LEFT ELBOW WITH DEBRIDEMENT AND REMOVAL LOOSE BODY;  Surgeon: Frederik Pear, MD;  Location: [place];  Service: Orthopedics;  Laterality: Left;  . ESOPHAGEAL MANOMETRY N/A 12/29/2015   Procedure: ESOPHAGEAL MANOMETRY (EM);  Surgeon: Manus Gunning, MD;  Location: [facility];  Service: Gastroenterology;  Laterality: N/A;  . FRACTURE SURGERY  ~ 2005   nose  . KNEE ARTHROSCOPY  09/04/2012   Procedure: ARTHROSCOPY KNEE;  Surgeon: Gearlean Alf, MD;  Location: [facility];  Service: Orthopedics;  Laterality: Right;  right knee arthroscopy with medial and lateral meniscus debridement  . MELANOMA EXCISION Right 10/13/2016   right side  of neck  . MOUTH SURGERY  2004   "bone replacement; had cadavear bones put in; face was collapsing"  . NASAL SEPTUM SURGERY  ~ 1986  . TOTAL KNEE ARTHROPLASTY Right 07/07/2013   Procedure: RIGHT TOTAL KNEE ARTHROPLASTY;  Surgeon: Gearlean Alf, MD;  Location: [facility];  Service: Orthopedics;  Laterality: Right;    There were no vitals filed for this visit.  Subjective Assessment - 07/17/19 1353    Subjective  Pt reports that she is doing well today. She reports that she didn't have any numbness/tingling yesterday and only started to notice it again this morning. She is still sore from the dry needling. No reported resting pain upon arrival today. Pt has an appointment tomorrow to see the MD for a consult regarding spinal injections. No specific questions or concerns at this time.     Pertinent History  On 08/03/18 pt was carrying a folding table and fell and right arm was under the handle got stuck between the door and the table. She landed on her R side. Tried to get up and had to pull at her arm. Pt had immediate pain in her right shoulder. She waited until Monday and went to see Dr. Edilia Bo her PCP. He took radiographs but didn't find any fractures. She continued to have pain so she called Dr. Damita Dunnings office, her orthopedist, and saw him that same week. He took additional radiographs and found a hairline fracture in her humeral shaft and a greater tuberosity fracture per patient report. She states that she is not healing well and has recurrent plain film radiographs. She is still wearing a sling and was encouraged not to perform any AROM of her right shoulder. Since her last visit with Dr. Mayer Camel on 09/20/18 she has had an additional fall and landed on her R shoulder. She reports additional bruising and pain in her R shoulder. She has a follow-up appt scheduled with her orthopedist for 10/15/18. 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 ROS negative for red flags. 06/05/19: Pt presents to physical therapy with reports of intermittent RUE numbness and tingling that began about 4 weeks ago. The numbness and tingling begins at the shoulder and extends down into the elbow. On days when it is more intense, it begins in the cervical spine. After about 5 seconds, numbness tingling subsides, and she experiences min hyperalgesia. Pt was told by physician that if numbness and tingling does not improve with conservative therapy after 3 months an MRI will be ordered. She is currently being seen in physical therapy for R shoulder pain due to recovery post greater tubercle fracture.    Limitations  Lifting    Diagnostic tests  Plain film radiographs with decreased healing per pt report    Patient Stated Goals  Improve ability to use RUE and decrease pain    Currently in Pain?   No/denies           TREATMENT   Therapeutic Exercise UBE 69mn forward/29m backward for warm-up during history; Bilateral shoulder extension with green theraband x 15 each; tactile and verbal cueing needed for form; Bilateral standing rows with green theraband x15 each; tactile and verbal cueing needed for form; Standing R shoulder flexion to 90 with red theraband x 15, assist required to keep R elbow straight; Standing RUE ER withredtheraband x 15; mod tactile and verbal cueing needed for form; Standing R shoulder abduction with red theraband x 15, mod assist for elbow  straight, upright posture, and proper form;   Manual Therapy  UBE 6mn forward/233m backward for warm-up during history; MHP to cervical paraspinals while continuing with interval history; R upper trap, levator and scalene stretches 30s x 2 each; R median nerve glides x 10;   Mechanical Cervical Traction Intermittent, 30 degree pull angle,25# 30s hold time, 16# 10s rest time x 10 minutes total. Pt denies any numbness/tingling at end of traction.    Pt educated throughout session about proper posture and technique with exercises. Improved exercise technique, movement at target joints, use of target muscles after min to mod verbal cues.   Pt demonstrates fair motivation during session.Pt is able to perform strengthening with persistent weakness noted in RUE with overhead resisted motions. She continues to have significant trigger points in R upper trap as well as reproduction of RUE pain with ULTT median bias. Decrease in RUE symptoms with median nerve glides. Pt reports very minimal RUE numbness after mechanical traction. Pt will benefit from PT services to address deficits in strength, mobility, and pain in order to return to full function at home.                      PT Short Term Goals - 05/22/19 1025      PT SHORT TERM GOAL #1   Title  Pt will be independent with HEP  in order to improve strength and decrease pain in order to improve pain-free function at home.    Time  4    Period  Weeks    Status  Achieved        PT Long Term Goals - 06/05/19 1117      PT LONG TERM GOAL #1   Title  Pt will decrease quick DASH score to below 30% in order to demonstrate clinically significant reduction in disability.    Baseline  09/30/18: 84.1%; 10/30/18: 56.8%; 11/27/18: 45.45%; 12/23/18: 40.9%; 01/21/19: 29.5%, 6/4: 43%; 05/22/19: 43.2%    Time  8    Period  Weeks    Status  Partially Met    Target Date  07/31/19      PT LONG TERM GOAL #2   Title  Pt will decrease worst shoulder pain as reported on NPRS to below 4/10 in order to demonstrate clinically significant reduction in pain.    Baseline  09/30/18: worst: 9/10; 10/30/18: 6/10; 11/27/18: 4/10; 12/23/18: 5/10 (pain is less frequent); 01/21/19: 5/10 (pain is less frequent); 03/19/19: 4/10; 6/4: 5/10; 05/22/19: 4/10 (decreased frequency of pain as well);    Time  8    Period  Weeks    Status  Partially Met    Target Date  07/31/19      PT LONG TERM GOAL #3   Title  Pt will improve PROM of R shoulder to within 10 degrees of R shoulder in order to improve functional use of R shoulder for ADLs/IADLs.    Baseline  R/L 129/150 Shoulder flexion, 100/165 Shoulder abduction, 50/93 Shoulder external rotation, 50/70 Shoulder internal rotation; 10/30/18: R/L 140/150 Shoulder flexion, 132/165 Shoulder abduction, 82/93 Shoulder external rotation, 70/70 Shoulder internal rotation; 12/23/18:  R/L 155/150 Shoulder flexion, 134/165 Shoulder abduction, 89/93 Shoulder external rotation, 70/70 Shoulder internal rotation, 01/21/19: R/L 154/150 Shoulder flexion, 136/165 Shoulder abduction, 85/93 Shoulder external rotation, 70/70 Shoulder internal rotation; 03/19/19: Unable to test over telemedicine 6/4: R: 128 flexion, 121 abduction, 78 shoulder ER, 70 shoulder IR; 05/22/19: R: 148 flexion (154 L side), 145 abduction (166 L side),  85 shoulder ER, 76  shoulder IR    Time  8    Period  Weeks    Status  Partially Met    Target Date  07/31/19      PT LONG TERM GOAL #4   Title  Pt will improve pain-free R shoulder strength for flexion and abduction so that it is symmetrical with L shoulder in order to return to full function at home    Baseline  12/23/18: R/L 4-/4+ Shoulder flexion, 4/4+ Shoulder abduction, both painful on R side; 01/21/19: R/L 4/4+ Shoulder flexion, 4/4+ Shoulder abduction, both painful on R side; 03/19/19: Unable to test over telemedicine 6/4: 4-/4; 05/22/19: R/L: Flexion: 4/4+ (no pain bilateral) Abduction: 4-/4 (painful on R side, no pain on L side);    Time  8    Period  Weeks    Status  Partially Met    Target Date  07/31/19      PT LONG TERM GOAL #5   Title  Pt will demonstrate decrease in NDI by at least 19% in order to demonstrate clinically significant reduction in disability related to neck injury/pain    Baseline  06/05/19: 36%    Time  8    Period  Weeks    Status  New    Target Date  07/31/19      Additional Long Term Goals   Additional Long Term Goals  --      PT LONG TERM GOAL #6   Title  --    Baseline  --    Time  --    Period  --    Status  --      PT LONG TERM GOAL #7   Title  --    Baseline  --    Time  --    Period  --    Status  --    Target Date  --            Plan - 07/17/19 1356    Clinical Impression Statement  Pt demonstrates fair motivation during session. Pt is able to perform strengthening with persistent weakness noted in RUE with overhead resisted motions. She continues to have significant trigger points in R upper trap as well as reproduction of RUE pain with ULTT median bias. Decrease in RUE symptoms with median nerve glides. Pt reports very minimal RUE numbness after mechanical traction. Pt will benefit from PT services to address deficits in strength, mobility, and pain in order to return to full function at home.    Personal Factors and Comorbidities  Age;Comorbidity  2;Past/Current Experience    Comorbidities  afib, CAD, fibromyalgia, DM, depression    Examination-Activity Limitations  Bathing;Carry    Examination-Participation Restrictions  Cleaning;Community Activity    Stability/Clinical Decision Making  Unstable/Unpredictable    Rehab Potential  Good    PT Frequency  2x / week    PT Duration  8 weeks    PT Treatment/Interventions  ADLs/Self Care Home Management;Aquatic Therapy;Canalith Repostioning;Cryotherapy;Electrical Stimulation;Iontophoresis '4mg'$ /ml Dexamethasone;Moist Heat;Traction;Ultrasound;Contrast Bath;DME Instruction;Gait training;Stair training;Functional mobility training;Therapeutic activities;Therapeutic exercise;Balance training;Neuromuscular re-education;Patient/family education;Manual techniques;Passive range of motion;Dry needling;Splinting;Taping;Vestibular;Spinal Manipulations    PT Next Visit Plan  Outcome measures, goals, progress note, Progress AAROM/AROM/strengthening of R shoulder as tolerated without pain, cervical traction, cervical mobilizations, nerve glides    PT Home Exercise Plan  Cervical retractions, Scapular retractions, R upper trap stretch, supine canes for AAROM flexion with hands together progressing to AROM supine flexion, AROM sidelying shoulder abduction, AROM sidelying  shoulder ER, seated pulleys for flexion and scaption ([id number])    Consulted and Agree with Plan of Care  Patient       Patient will benefit from skilled therapeutic intervention in order to improve the following deficits and impairments:  Decreased strength, Decreased range of motion, Impaired UE functional use, Pain  Visit Diagnosis: Cervicalgia  Radiculopathy, cervical region  Acute pain of right shoulder  Stiffness of right shoulder, not elsewhere classified     Problem List Patient Active Problem List   Diagnosis Date Noted  . Chronic diarrhea 01/14/2019  . Malignant melanoma ([place]) 11/23/2016  . Urge incontinence 11/23/2016  .  Atypical mole 09/22/2016  . Slurring of speech 05/12/2016  . Traumatic arthritis elbow 05/04/2015  . Left foot pain 12/31/2014  . Severe sprain of left ankle 12/31/2014  . Osteopenia 09/15/2014  . History of TIA (transient ischemic attack) 05/19/2014  . Fracture of coronoid process of ulna, left, closed 04/25/2014  . Ingrown toenail 01/15/2014  . Obesity (BMI 30-39.9) 10/08/2013  . OA (osteoarthritis) of knee 07/07/2013  . Chronic low back pain 05/09/2013  . Obstructive sleep apnea 05/09/2013  . GERD (gastroesophageal reflux disease) 05/09/2013  . Hiatal hernia 05/09/2013  . Fibromyalgia syndrome 05/09/2013  . Depression, major, in partial remission ([place]) 05/09/2013  . RLS (restless legs syndrome) 05/09/2013  . Diabetes mellitus with no complication ([place]) 51/08/2110  . High cholesterol 05/09/2013  . 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 Family history of colon cancer 05/09/2013  . Allergic rhinitis 05/09/2013  . Lateral meniscal tear 09/04/2012  . CAD (coronary artery disease) 09/27/2011  . HTN (hypertension) 09/27/2011  . Paroxysmal A-fib ([place]) 09/27/2011   Phillips Grout PT, DPT, GCS  Morgan [name] 07/18/2019, 12:01 PM  [place] MAIN [facility] SERVICES [address] [place], [place], [address] Phone: [phone]   Fax:  [phone]  Name: Morgan [name] MRN: [id number] Date of Birth: 1941-02-20

## 2019-07-24 ENCOUNTER — Telehealth: Payer: Self-pay | Admitting: Genetic Counselor

## 2019-07-24 NOTE — Telephone Encounter (Signed)
Received a genetic counseling referral from Dr. Havery Moros for benign neoplasm of transverse colon. Pt has been cld and scheduled to see Raquel Sarna on 9/17 at 3pm. Pt declined a webex visit. She's been made aware to arrive 15 minutes early.

## 2019-07-26 ENCOUNTER — Other Ambulatory Visit (HOSPITAL_COMMUNITY)
Admission: RE | Admit: 2019-07-26 | Discharge: 2019-07-26 | Disposition: A | Discharge status: Home / Self Care | Payer: Medicare Other | Source: Ambulatory Visit | Attending: Gastroenterology | Admitting: Gastroenterology

## 2019-07-26 DIAGNOSIS — Z20828 Contact with and (suspected) exposure to other viral communicable diseases: Secondary | ICD-10-CM | POA: Insufficient documentation

## 2019-07-26 DIAGNOSIS — Z01812 Encounter for preprocedural laboratory examination: Secondary | ICD-10-CM | POA: Diagnosis present

## 2019-07-26 NOTE — Progress Notes (Signed)
LVM regarding pt not showing up for her COVID test.

## 2019-07-27 LAB — NOVEL CORONAVIRUS, NAA (HOSP ORDER, SEND-OUT TO REF LAB; TAT 18-24 HRS): SARS-CoV-2, NAA: NOT DETECTED

## 2019-07-29 NOTE — Progress Notes (Signed)
Attempted to contact no answer left message on voicemail

## 2019-07-30 ENCOUNTER — Ambulatory Visit (HOSPITAL_COMMUNITY)
Admission: RE | Admit: 2019-07-30 | Discharge: 2019-07-30 | Disposition: A | Discharge status: Home / Self Care | Payer: Medicare Other | Attending: Gastroenterology | Admitting: Gastroenterology

## 2019-07-30 ENCOUNTER — Encounter (HOSPITAL_COMMUNITY)
Admission: RE | Disposition: A | Discharge status: Home / Self Care | Payer: Self-pay | Source: Home / Self Care | Attending: Gastroenterology

## 2019-07-30 DIAGNOSIS — R110 Nausea: Secondary | ICD-10-CM | POA: Diagnosis not present

## 2019-07-30 DIAGNOSIS — R1310 Dysphagia, unspecified: Secondary | ICD-10-CM | POA: Diagnosis not present

## 2019-07-30 DIAGNOSIS — R0989 Other specified symptoms and signs involving the circulatory and respiratory systems: Secondary | ICD-10-CM | POA: Diagnosis not present

## 2019-07-30 DIAGNOSIS — K219 Gastro-esophageal reflux disease without esophagitis: Secondary | ICD-10-CM | POA: Diagnosis present

## 2019-07-30 HISTORY — PX: ESOPHAGEAL MANOMETRY: SHX5429

## 2019-07-30 SURGERY — MANOMETRY, ESOPHAGUS
Anesthesia: Choice

## 2019-07-30 MED ORDER — LIDOCAINE VISCOUS HCL 2 % MT SOLN
OROMUCOSAL | Status: AC
  Filled 2019-07-30: qty 15

## 2019-07-30 SURGICAL SUPPLY — 2 items
FACESHIELD LNG OPTICON STERILE (SAFETY) IMPLANT
GLOVE BIO SURGEON STRL SZ8 (GLOVE) ×4 IMPLANT

## 2019-07-30 NOTE — Progress Notes (Signed)
Esophageal manometry done per protocol.  Patient tolerated well.  Report to be sent to Dr Kavitha Nandigam.   

## 2019-07-31 ENCOUNTER — Inpatient Hospital Stay: Payer: Medicare Other

## 2019-07-31 ENCOUNTER — Encounter: Payer: Self-pay | Admitting: Genetic Counselor

## 2019-07-31 ENCOUNTER — Inpatient Hospital Stay: Payer: Medicare Other | Attending: Genetic Counselor | Admitting: Genetic Counselor

## 2019-07-31 ENCOUNTER — Other Ambulatory Visit: Payer: Self-pay

## 2019-07-31 DIAGNOSIS — Z803 Family history of malignant neoplasm of breast: Secondary | ICD-10-CM | POA: Insufficient documentation

## 2019-07-31 DIAGNOSIS — Z800 Family history of malignant neoplasm of digestive organs: Secondary | ICD-10-CM | POA: Diagnosis not present

## 2019-07-31 DIAGNOSIS — Z86010 Personal history of colonic polyps: Secondary | ICD-10-CM

## 2019-07-31 DIAGNOSIS — C434 Malignant melanoma of scalp and neck: Secondary | ICD-10-CM | POA: Diagnosis not present

## 2019-07-31 NOTE — Progress Notes (Signed)
REFERRING PROVIDER: Yetta Flock, MD [address] [place],  [place] [address]  PRIMARY PROVIDER:  Jinny Sanders, MD  PRIMARY REASON FOR VISIT:  1. History of colon polyps   2. Family history of colon cancer   3. Family history of breast cancer   4. 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 female, was seen for a [place] cancer genetics consultation at the request of Dr. Havery Moros due to a personal history of colon polyps and a family history of colon polyps, colon cancer, and breast cancer.  Morgan [name] presents to clinic today to discuss the possibility of a hereditary predisposition to cancer, genetic testing, and to further clarify her future cancer risks, as well as potential cancer risks for family members.   Morgan [name] has had more than 20 polyps in her life. Morgan [name] most recent colonoscopy, in August of 2020, discovered 14 tubular adenomas that were removed. Her previous colonoscopy in 2015 showed two polyps that were not tubular adenomas. Between the years of 2010 and 2015, Morgan [name] reports having a colonoscopy every year, with polyps removed every year. This includes one colonoscopy approximately 9 years ago that revealed 10 polyps. Most of these records are not available for review today.   Morgan [name] also has a history of melanoma that was removed two years ago when she was 67.   CANCER HISTORY:  Oncology History   No history exists.     RISK FACTORS:  Menarche was at age 33-14.  First live birth at age 75.  OCP use for approximately 10 years.  Ovaries intact: yes.  Hysterectomy: no.  Menopausal status: postmenopausal.  HRT use: 0 years. Colonoscopy: yes; abnormal. Mammogram within the last year: yes. Number of breast biopsies: 0.   Past Medical History:  Diagnosis Date  . Angina   . Arthritis   . Atrial fibrillation (HCC)    ASPIRIN FOR BLOOD THINNER  . Atypical mole  09/22/2016  . Bulging discs    lumbar   . Cancer ([place])    melanoma  . Complication of anesthesia    pt states has choking sensation with ET tube   . Coronary artery disease   . Depression   . Diabetes mellitus    diet controlled/on meds  . Family history of breast cancer   . Family history of colon cancer   . Fibromyalgia   . GERD (gastroesophageal reflux disease)   . H/O hiatal hernia   . Headache(784.0)    "recurring"  . High cholesterol   . History of colon polyps   . Hyperlipidemia   . Hypertension   . 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 Migraines    "til ~ 1980"  . PONV (postoperative nausea and vomiting)   . Restless leg syndrome   . Sciatic nerve pain    "from pinched nerve"  . Sleep apnea    uses CPAP  . Stroke [facility]) 2014   no deficits  . Weakness of right side of body    "I've had PT for it; they don't know what it's from".  CORTISONE INJECTION INTO BACK 08/30/12    Past Surgical History:  Procedure Laterality Date  . [place] STUDY N/A 12/29/2015   Procedure: [place] STUDY;  Surgeon: Manus Gunning, MD;  Location: [facility];  Service: Gastroenterology;  Laterality: N/A;  . CARPAL TUNNEL RELEASE Left 07/02/2015  Procedure: CARPAL TUNNEL RELEASE;  Surgeon: Frederik Pear, MD;  Location: [facility];  Service: Orthopedics;  Laterality: Left;  . CATARACT EXTRACTION, BILATERAL Bilateral    Oct and Nov 2017  . CORONARY ANGIOPLASTY WITH STENT PLACEMENT  06/2011   "1"  . CORONARY ARTERY BYPASS GRAFT  2005   CABG X 2  . DILATION AND CURETTAGE OF UTERUS     "more than once"  . ELBOW ARTHROSCOPY Left 07/02/2015   Procedure: ARTHROSCOPY LEFT ELBOW WITH DEBRIDEMENT AND REMOVAL LOOSE BODY;  Surgeon: Frederik Pear, MD;  Location: [facility];  Service: Orthopedics;  Laterality: Left;  . ESOPHAGEAL MANOMETRY N/A 12/29/2015   Procedure: ESOPHAGEAL MANOMETRY (EM);  Surgeon: Manus Gunning, MD;  [facility]: [facility];  Service:  Gastroenterology;  Laterality: N/A;  . FRACTURE SURGERY  ~ 2005   nose  . KNEE ARTHROSCOPY  09/04/2012   Procedure: ARTHROSCOPY KNEE;  Surgeon: Gearlean Alf, MD;  Location: [facility];  Service: Orthopedics;  Laterality: Right;  right knee arthroscopy with medial and lateral meniscus debridement  . 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 MOUTH SURGERY  2004   "bone replacement; had cadavear bones put in; face was collapsing"  . NASAL SEPTUM SURGERY  ~ 1986  . TOTAL KNEE ARTHROPLASTY Right 07/07/2013   Procedure: RIGHT TOTAL KNEE ARTHROPLASTY;  Surgeon: Gearlean Alf, MD;  Location: [facility];  Service: Orthopedics;  Laterality: Right;    Social History   Socioeconomic History  . Marital status: Married    Spouse name: Not on file  . Number of children: 2  . Years of education: college  . Highest education level: Not on file  Occupational History  . Occupation: Retired   Scientific laboratory technician  . Financial resource strain: Not on file  . Food insecurity    Worry: Not on file    Inability: Not on file  . Transportation needs    Medical: Not on file    Non-medical: Not on file  Tobacco Use  . Smoking status: Never Smoker  . Smokeless tobacco: Never Used  Substance and Sexual Activity  . Alcohol use: Yes    Alcohol/week: 1.0 standard drinks    Types: 1 Glasses of wine per week    Comment: "occasionally drink wine"  . Drug use: No  . Sexual activity: Yes  Lifestyle  . Physical activity    Days per week: Not on file    Minutes per session: Not on file  . Stress: Not on file  Relationships  . Social Herbalist on phone: Not on file    Gets together: Not on file    Attends religious service: Not on file    Active member of club or organization: Not on file    Attends meetings of clubs or organizations: Not on file    Relationship status: Not on file  Other Topics Concern  . Not on file  Social History Narrative   Drinks 1 cup of coffee a day       FAMILY HISTORY:  We obtained a detailed, 4-generation family history.  Significant diagnoses are listed below: Family History  Problem Relation Age of Onset  . Cancer Mother 22       breast cancer  . Breast cancer Mother 38  . Heart disease Father 24       sudden onset due to CAD  . Cancer Sister 55  colon  . Hypertension Sister   . Dementia Sister   . Diabetes Sister   . Colon cancer Sister   . GER disease Daughter   . Hypertension Daughter   . Heart disease Brother   . Hypertension Brother   . Heart attack Neg Hx   . Stroke Neg Hx    Morgan [name] has a daughter who is 57 and a son who is 17. She also has an adopted son who is not biologically related. She has one sister and two brothers. Her sister had colon cancer in her 1s, which was treated with a surgery that removed most of her colon. She believes that her brothers have had colon polyps as well, although not as many as her.  Morgan [name] mother died at the age of 73 and was diagnosed with breast cancer at the age of 22.  She had two maternal aunts and five maternal uncles who all died older than 63. None of these individuals or any of their children have had cancer. Morgan [name] maternal grandmother died in her late 28s, and her grandfather died at the age of 25.  Morgan [name]'s father died at the age of 51. She had three paternal aunts and four paternal uncles who all died older than 24. None of these individuals or any of their children have had cancer. Morgan [name] paternal grandparents both died in their 87s.   Morgan [name] is unaware of previous family history of genetic testing for hereditary cancer risks. Patient's maternal ancestors are of European descent, and paternal ancestors are of [place] descent. There is no reported Ashkenazi Jewish ancestry. There is no known consanguinity.  GENETIC COUNSELING ASSESSMENT: Morgan [name] is a 78 y.o. female with a personal history of colon polyps and a family history  of colon and breast cancer, which is somewhat suggestive of a hereditary cancer syndrome to cancer. We, therefore, discussed and recommended the following at today's visit.   DISCUSSION: We discussed that polyps in general are common, however, most people have fewer than 5 lifetime polyps.  When an individual has 10 or more polyps we become concerned about an underlying polyposis syndrome.  The most common hereditary polyposis syndromes are caused by problems in the APC and MUTYH genes, however, more recently, mutations in the [place] and MSH3 genes have been identified in some polyposis families.     We reviewed the characteristics, features and inheritance patterns of hereditary cancer syndromes. We also discussed genetic testing, including the appropriate family members to test, the process of testing, insurance coverage and turn-around-time for results. We discussed the implications of a negative, positive and/or variant of uncertain significant result. We recommended Morgan [name] pursue genetic testing for the [facility] ColoNext+RNAinsight panel.  The ColoNext gene panel offered by [facility] and includes sequencing and rearrangement analysis for the following 20 genes: APC, AXIN2, BMPR1A, CDH1, CHEK2, EPCAM, GREM1, MLH1, MSH2, MSH3, MSH6, MUTYH, NTHL1, PMS2, POLD1, POLE, PTEN, SMAD4, STK11, and TP53.      Based on Morgan [name]'s personal history of colon polyps, she meets medical criteria for genetic testing. Despite that she meets criteria, she may still have an out of pocket cost. We discussed that if her out of pocket cost for testing is over $100, the laboratory will call and confirm whether she wants to proceed with testing.  If the out of pocket cost of testing is less than $100 she will be billed by the genetic testing laboratory.   PLAN: After considering the risks,  benefits, and limitations, Morgan [name] provided informed consent to pursue genetic testing and the blood sample was sent to [facility] for analysis of the ColoNext+RNAinsight panel. Results should be available within approximately two-three weeks' time, at which point they will be disclosed by telephone to Ms. Chandler, as will any additional recommendations warranted by these results. Ms. Kappes will receive a summary of her genetic counseling visit and a copy of her results once available. This information will also be available in Epic.   Ms. Klett questions were answered to her satisfaction today. Our contact information was provided should additional questions or concerns arise. Thank you for the referral and allowing [place] to share in the care of your patient.   Clint Guy, MS, [facility] Certified Genetic Counselor [email or web address] Phone: [phone]  The patient was seen for a total of 40 minutes in face-to-face genetic counseling.  This patient was discussed with Drs. Magrinat, Lindi Adie and/or Burr Medico who agrees with the above.    _______________________________________________________________________ For Office Staff:  Number of people involved in session: 1 Was an Intern/ student involved with case: no

## 2019-08-01 ENCOUNTER — Encounter (HOSPITAL_COMMUNITY): Payer: Self-pay | Admitting: Gastroenterology

## 2019-08-05 ENCOUNTER — Other Ambulatory Visit: Payer: Self-pay

## 2019-08-05 ENCOUNTER — Ambulatory Visit: Payer: Medicare Other

## 2019-08-05 DIAGNOSIS — M6281 Muscle weakness (generalized): Secondary | ICD-10-CM

## 2019-08-05 DIAGNOSIS — M542 Cervicalgia: Secondary | ICD-10-CM

## 2019-08-05 DIAGNOSIS — M25611 Stiffness of right shoulder, not elsewhere classified: Secondary | ICD-10-CM

## 2019-08-05 DIAGNOSIS — M5412 Radiculopathy, cervical region: Secondary | ICD-10-CM

## 2019-08-05 DIAGNOSIS — M25511 Pain in right shoulder: Secondary | ICD-10-CM

## 2019-08-05 NOTE — Therapy (Signed)
[place] [facility] SERVICES [address] [place], [place], [address] Phone: [phone]   Fax:  [phone]  Physical Therapy Treatment/Progress Note/Recertification  Dates of reporting period  06/03/19   to   08/05/19   Patient Details  Name: Morgan [name] MRN: [id number] Date of Birth: 07-22-1941 Referring Provider (PT): Dr. Mayer Camel   Encounter Date: 08/05/2019  PT End of Session - 08/05/19 1509    Visit Number  50    Number of Visits  81    Date for PT Re-Evaluation  09/30/19    Authorization Type  goals updated 08/05/19    PT Start Time  1300    PT Stop Time  1345    PT Time Calculation (min)  45 min    Activity Tolerance  Patient tolerated treatment well    Behavior During Therapy  [facility] for tasks assessed/performed       Past Medical History:  Diagnosis Date  . Angina   . Arthritis   . Atrial fibrillation (HCC)    ASPIRIN FOR BLOOD THINNER  . Atypical mole 09/22/2016  . Bulging discs    lumbar   . Cancer ([place])    melanoma  . Complication of anesthesia    pt states has choking sensation with ET tube   . Coronary artery disease   . Depression   . Diabetes mellitus    diet controlled/on meds  . Family history of breast cancer   . Family history of colon cancer   . Fibromyalgia   . GERD (gastroesophageal reflux disease)   . H/O hiatal hernia   . Headache(784.0)    "recurring"  . High cholesterol   . History of colon polyps   . Hyperlipidemia   . Hypertension   . 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 Migraines    "til ~ 1980"  . PONV (postoperative nausea and vomiting)   . Restless leg syndrome   . Sciatic nerve pain    "from pinched nerve"  . Sleep apnea    uses CPAP  . Stroke [facility]) 2014   no deficits  . Weakness of right side of body    "I've had PT for it; they don't know what it's from".  CORTISONE INJECTION INTO BACK 08/30/12    Past Surgical History:  Procedure Laterality Date  . [place] STUDY N/A 12/29/2015    Procedure: [place] STUDY;  Surgeon: Manus Gunning, MD;  Location: [facility];  Service: Gastroenterology;  Laterality: N/A;  . CARPAL TUNNEL RELEASE Left 07/02/2015   Procedure: CARPAL TUNNEL RELEASE;  Surgeon: Frederik Pear, MD;  Location: [place];  Service: Orthopedics;  Laterality: Left;  . CATARACT EXTRACTION, BILATERAL Bilateral    Oct and Nov 2017  . CORONARY ANGIOPLASTY WITH STENT PLACEMENT  06/2011   "1"  . CORONARY ARTERY BYPASS GRAFT  2005   CABG X 2  . DILATION AND CURETTAGE OF UTERUS     "more than once"  . ELBOW ARTHROSCOPY Left 07/02/2015   Procedure: ARTHROSCOPY LEFT ELBOW WITH DEBRIDEMENT AND REMOVAL LOOSE BODY;  Surgeon: Frederik Pear, MD;  Location: [facility];  Service: Orthopedics;  Laterality: Left;  . ESOPHAGEAL MANOMETRY N/A 12/29/2015   Procedure: ESOPHAGEAL MANOMETRY (EM);  Surgeon: Manus Gunning, MD;  Location: [facility];  Service: Gastroenterology;  Laterality: N/A;  . ESOPHAGEAL MANOMETRY N/A 07/30/2019   Procedure: ESOPHAGEAL MANOMETRY (EM);  Surgeon: Yetta Flock, MD;  Location: [facility];  Service:  Gastroenterology;  Laterality: N/A;  . FRACTURE SURGERY  ~ 2005   nose  . KNEE ARTHROSCOPY  09/04/2012   Procedure: ARTHROSCOPY KNEE;  Surgeon: Gearlean Alf, MD;  Location: [facility];  Service: Orthopedics;  Laterality: Right;  right knee arthroscopy with medial and lateral meniscus debridement  . 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 MOUTH SURGERY  2004   "bone replacement; had cadavear bones put in; face was collapsing"  . NASAL SEPTUM SURGERY  ~ 1986  . TOTAL KNEE ARTHROPLASTY Right 07/07/2013   Procedure: RIGHT TOTAL KNEE ARTHROPLASTY;  Surgeon: Gearlean Alf, MD;  Location: [facility];  Service: Orthopedics;  Laterality: Right;    There were no vitals filed for this visit.  Subjective Assessment - 08/05/19 1309    Subjective  Pt reports that she is doing well today. She  reports that she still has numbness/tingling 1-2x a day that lasts for about a minute.  No reported resting pain upon arrival today. She states that she had a spinal injection a few weeks ago with mild relief.  No specific questions or concerns at this time.    Pertinent History  On 08/03/18 pt was carrying a folding table and fell and right arm was under the handle got stuck between the door and the table. She landed on her R side. Tried to get up and had to pull at her arm. Pt had immediate pain in her right shoulder. She waited until Monday and went to see Dr. Edilia Bo her PCP. He took radiographs but didn't find any fractures. She continued to have pain so she called Dr. Damita Dunnings office, her orthopedist, and saw him that same week. He took additional radiographs and found a hairline fracture in her humeral shaft and a greater tuberosity fracture per patient report. She states that she is not healing well and has recurrent plain film radiographs. She is still wearing a sling and was encouraged not to perform any AROM of her right shoulder. Since her last visit with Dr. Mayer Camel on 09/20/18 she has had an additional fall and landed on her R shoulder. She reports additional bruising and pain in her R shoulder. She has a follow-up appt scheduled with her orthopedist for 10/15/18. 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 ROS negative for red flags. 06/05/19: Pt presents to physical therapy with reports of intermittent RUE numbness and tingling that began about 4 weeks ago. The numbness and tingling begins at the shoulder and extends down into the elbow. On days when it is more intense, it begins in the cervical spine. After about 5 seconds, numbness tingling subsides, and she experiences min hyperalgesia. Pt was told by physician that if numbness and tingling does not improve with conservative therapy after 3 months an MRI will be ordered. She is currently being seen in physical therapy for R  shoulder pain due to recovery post greater tubercle fracture.    Limitations  Lifting    Diagnostic tests  Plain film radiographs with decreased healing per pt report    Patient Stated Goals  Improve ability to use RUE and decrease pain    Currently in Pain?  No/denies          TREATMENT    GOALS UPDATE:   Reviewed HEP   Quick DASH: 25%  NDI: 8%  NPRS  worst shoulder pain = 2/10  R shoulder PROM with goniometer: R flexion 150 deg (L  162) R abduction 165 (L 170); R ER 89; R IR 90     Therapeutic Exercise   Bilateral shoulder extension with green theraband x10; tactile and verbal cueing needed for form;   Bilateral standing rows with green theraband x10; tactile and verbal cueing needed for form;   Standing R shoulder flexion to 90 with red theraband x 15, assist required to keep R elbow straight;   Standing RUE ER with red theraband x 15; mod tactile and verbal cueing needed for form;   Standing R shoulder abduction with red theraband x 15, mod assist for elbow straight, upright posture, and proper form;   R Scap lift offs x10        Pt educated throughout session about proper posture and technique with exercises. Improved exercise technique, movement at target joints, use of target muscles after min to mod verbal cues.        Pt demonstrates good motivation throughout today's session. She has met 3 LTGs to date, making progress towards the rest of her goals.  She still complains of numbness and tingling in her RUE that goes further down her arm now, but occurs less frequently and shorter duration compared to starting PT.  She has improved her NDI from a 36% to an 8% today.  She has improved her Quick DASH a 43.2% in July to a 25% today.  She states that her worst shoulder pain is now a 2/10, improving from 4/10 in July.  She still lacks full PROM in her R shoulder, with 150 deg of R shoulder flexion, 165 deg of R shoulder abduction, 89 deg of R ER, and 90 deg of R IR.   She still displays decreased strength in her R shoulder, with 4/5 strength with R flexion with some pain noted with resistance and 4+/5 on R shoulder abduction.  In future sessions, ULNTT and mechanical traction will be utilized in an effort to continue decreasing her numbness and tingling in her RUE.  Pt will benefit from PT services to address deficits in strength, mobility, and pain in order to return to full function at home.                PT Short Term Goals - 08/05/19 1509      PT SHORT TERM GOAL #1   Title  Pt will be independent with HEP in order to improve strength and decrease pain in order to improve pain-free function at home.    Time  4    Period  Weeks    Status  Achieved        PT Long Term Goals - 08/05/19 1318      PT LONG TERM GOAL #1   Title  Pt will decrease quick DASH score to below 30% in order to demonstrate clinically significant reduction in disability.    Baseline  09/30/18: 84.1%; 10/30/18: 56.8%; 11/27/18: 45.45%; 12/23/18: 40.9%; 01/21/19: 29.5%, 6/4: 43%; 05/22/19: 43.2%; 08/05/19: 25%    Time  8    Period  Weeks    Status  Achieved      PT LONG TERM GOAL #2   Title  Pt will decrease worst shoulder pain as reported on NPRS to below 4/10 in order to demonstrate clinically significant reduction in pain.    Baseline  09/30/18: worst: 9/10; 10/30/18: 6/10; 11/27/18: 4/10; 12/23/18: 5/10 (pain is less frequent); 01/21/19: 5/10 (pain is less frequent); 03/19/19: 4/10; 6/4: 5/10; 05/22/19: 4/10 (decreased frequency of pain as well); 08/05/19: 2/10  Time  8    Period  Weeks    Status  Achieved      PT LONG TERM GOAL #3   Title  Pt will improve PROM of R shoulder to within 10 degrees of R shoulder in order to improve functional use of R shoulder for ADLs/IADLs.    Baseline  R/L 129/150 Shoulder flexion, 100/165 Shoulder abduction, 50/93 Shoulder external rotation, 50/70 Shoulder internal rotation; 10/30/18: R/L 140/150 Shoulder flexion, 132/165 Shoulder abduction,  82/93 Shoulder external rotation, 70/70 Shoulder internal rotation; 12/23/18:  R/L 155/150 Shoulder flexion, 134/165 Shoulder abduction, 89/93 Shoulder external rotation, 70/70 Shoulder internal rotation, 01/21/19: R/L 154/150 Shoulder flexion, 136/165 Shoulder abduction, 85/93 Shoulder external rotation, 70/70 Shoulder internal rotation; 03/19/19: Unable to test over telemedicine 6/4: R: 128 flexion, 121 abduction, 78 shoulder ER, 70 shoulder IR; 05/22/19: R: 148 flexion (154 L side), 145 abduction (166 L side), 85 shoulder ER, 76 shoulder IR; 08/05/19: R flexion 150 deg (L 162) R abduction 165 (L 170); R ER 89; R IR 90    Time  8    Period  Weeks    Status  Partially Met    Target Date  09/30/19      PT LONG TERM GOAL #4   Title  Pt will improve pain-free R shoulder strength for flexion and abduction so that it is symmetrical with L shoulder in order to return to full function at home    Baseline  12/23/18: R/L 4-/4+ Shoulder flexion, 4/4+ Shoulder abduction, both painful on R side; 01/21/19: R/L 4/4+ Shoulder flexion, 4/4+ Shoulder abduction, both painful on R side; 03/19/19: Unable to test over telemedicine 6/4: 4-/4; 05/22/19: R/L: Flexion: 4/4+ (no pain bilateral) Abduction: 4-/4 (painful on R side, no pain on L side); 08/05/19: R/L flexion 4/5 (some pain on R), R/L abduction 4+/5 but no pain    Time  8    Period  Weeks    Status  Partially Met    Target Date  09/30/19      PT LONG TERM GOAL #5   Title  Pt will demonstrate decrease in NDI by at least 19% in order to demonstrate clinically significant reduction in disability related to neck injury/pain    Baseline  06/05/19: 36%; 08/05/19: 8%    Time  8    Period  Weeks    Status  Achieved            Plan - 08/05/19 1511    Clinical Impression Statement  Pt demonstrates good motivation throughout today's session. She has met 3 LTGs to date, making progress towards the rest of her goals.  She still complains of numbness and tingling in her RUE that  goes further down her arm now, but occurs less frequently and shorter duration compared to starting PT.  She has improved her NDI from a 36% to an 8% today.  She has improved her Quick DASH a 43.2% in July to a 25% today.  She states that her worst shoulder pain is now a 2/10, improving from 4/10 in July.  She still lacks full PROM in her R shoulder, with 150 deg of R shoulder flexion, 165 deg of R shoulder abduction, 89 deg of R ER, and 90 deg of R IR.  She still displays decreased strength in her R shoulder, with 4/5 strength with R flexion with some pain noted with resistance and 4+/5 on R shoulder abduction.  In future sessions, ULNTT and mechanical traction will be utilized in  an effort to continue decreasing her numbness and tingling in her RUE.  Pt will benefit from PT services to address deficits in strength, mobility, and pain in order to return to full function at home.    Personal Factors and Comorbidities  Age;Comorbidity 2;Past/Current Experience    Comorbidities  afib, CAD, fibromyalgia, DM, depression    Examination-Activity Limitations  Bathing;Carry    Examination-Participation Restrictions  Cleaning;Community Activity    Stability/Clinical Decision Making  Unstable/Unpredictable    Rehab Potential  Good    PT Frequency  2x / week    PT Duration  8 weeks    PT Treatment/Interventions  ADLs/Self Care Home Management;Aquatic Therapy;Canalith Repostioning;Cryotherapy;Electrical Stimulation;Iontophoresis '4mg'$ /ml Dexamethasone;Moist Heat;Traction;Ultrasound;Contrast Bath;DME Instruction;Gait training;Stair training;Functional mobility training;Therapeutic activities;Therapeutic exercise;Balance training;Neuromuscular re-education;Patient/family education;Manual techniques;Passive range of motion;Dry needling;Splinting;Taping;Vestibular;Spinal Manipulations    PT Next Visit Plan  Progress AAROM/AROM/strengthening of R shoulder as tolerated without pain, cervical traction, cervical mobilizations,  nerve glides    PT Home Exercise Plan  Cervical retractions, Scapular retractions, R upper trap stretch, supine canes for AAROM flexion with hands together progressing to AROM supine flexion, AROM sidelying shoulder abduction, AROM sidelying shoulder ER, seated pulleys for flexion and scaption ([id number])    Consulted and Agree with Plan of Care  Patient       Patient will benefit from skilled therapeutic intervention in order to improve the following deficits and impairments:  Decreased strength, Decreased range of motion, Impaired UE functional use, Pain  Visit Diagnosis: Cervicalgia  Radiculopathy, cervical region  Acute pain of right shoulder  Stiffness of right shoulder, not elsewhere classified  Muscle weakness (generalized)     Problem List Patient Active Problem List   Diagnosis Date Noted  . Family history of breast cancer   . History of colon polyps   . Chronic diarrhea 01/14/2019  . Malignant melanoma ([place]) 11/23/2016  . Urge incontinence 11/23/2016  . Atypical mole 09/22/2016  . Slurring of speech 05/12/2016  . Traumatic arthritis elbow 05/04/2015  . Left foot pain 12/31/2014  . Severe sprain of left ankle 12/31/2014  . Osteopenia 09/15/2014  . History of TIA (transient ischemic attack) 05/19/2014  . Fracture of coronoid process of ulna, left, closed 04/25/2014  . Ingrown toenail 01/15/2014  . Obesity (BMI 30-39.9) 10/08/2013  . OA (osteoarthritis) of knee 07/07/2013  . Chronic low back pain 05/09/2013  . Obstructive sleep apnea 05/09/2013  . GERD (gastroesophageal reflux disease) 05/09/2013  . Hiatal hernia 05/09/2013  . Fibromyalgia syndrome 05/09/2013  . Depression, major, in partial remission ([place]) 05/09/2013  . RLS (restless legs syndrome) 05/09/2013  . Diabetes mellitus with no complication ([place]) 85/27/7824  . High cholesterol 05/09/2013  . 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 Family history of colon cancer 05/09/2013  . Allergic rhinitis 05/09/2013   . Lateral meniscal tear 09/04/2012  . CAD (coronary artery disease) 09/27/2011  . HTN (hypertension) 09/27/2011  . Paroxysmal A-fib ([place]) 09/27/2011    This entire session was performed under direct supervision and direction of a licensed therapist/therapist assistant . I have personally read, edited and approve of the note as written.   Lutricia Horsfall, SPT [facility]out PT, DPT, GCS  Huprich,Jason 08/06/2019, 2:13 PM  [place] [facility] SERVICES [address] [place], [place], [address] Phone: [phone]   Fax:  [phone]  Name: Morgan [name] MRN: [id number] Date of Birth: 10-27-1941

## 2019-08-07 ENCOUNTER — Other Ambulatory Visit: Payer: Self-pay

## 2019-08-07 ENCOUNTER — Ambulatory Visit: Payer: Medicare Other

## 2019-08-07 DIAGNOSIS — M5412 Radiculopathy, cervical region: Secondary | ICD-10-CM

## 2019-08-07 DIAGNOSIS — M6281 Muscle weakness (generalized): Secondary | ICD-10-CM

## 2019-08-07 DIAGNOSIS — M542 Cervicalgia: Secondary | ICD-10-CM

## 2019-08-07 DIAGNOSIS — M25611 Stiffness of right shoulder, not elsewhere classified: Secondary | ICD-10-CM

## 2019-08-07 NOTE — Therapy (Signed)
[place] MAIN [facility] SERVICES [address] [place], [place], [address] Phone: [phone]   Fax:  [phone]  Physical Therapy Treatment  Patient Details  Name: Morgan [name] MRN: [id number] Date of Birth: 1941/04/29 Referring Provider (PT): Dr. Mayer Camel   Encounter Date: 08/07/2019  PT End of Session - 08/07/19 1114    Visit Number  51    Number of Visits  81    Date for PT Re-Evaluation  09/30/19    Authorization Type  goals updated 08/05/19    PT Start Time  0845    PT Stop Time  0930    PT Time Calculation (min)  45 min    Activity Tolerance  Patient tolerated treatment well    Behavior During Therapy  [facility] for tasks assessed/performed       Past Medical History:  Diagnosis Date  . Angina   . Arthritis   . Atrial fibrillation (HCC)    ASPIRIN FOR BLOOD THINNER  . Atypical mole 09/22/2016  . Bulging discs    lumbar   . Cancer ([place])    melanoma  . Complication of anesthesia    pt states has choking sensation with ET tube   . Coronary artery disease   . Depression   . Diabetes mellitus    diet controlled/on meds  . Family history of breast cancer   . Family history of colon cancer   . Fibromyalgia   . GERD (gastroesophageal reflux disease)   . H/O hiatal hernia   . Headache(784.0)    "recurring"  . High cholesterol   . History of colon polyps   . Hyperlipidemia   . Hypertension   . 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 Migraines    "til ~ 1980"  . PONV (postoperative nausea and vomiting)   . Restless leg syndrome   . Sciatic nerve pain    "from pinched nerve"  . Sleep apnea    uses CPAP  . Stroke [facility]) 2014   no deficits  . Weakness of right side of body    "I've had PT for it; they don't know what it's from".  CORTISONE INJECTION INTO BACK 08/30/12    Past Surgical History:  Procedure Laterality Date  . [place] STUDY N/A 12/29/2015   Procedure: [place] STUDY;  Surgeon: Manus Gunning, MD;  Location: [facility]  ENDOSCOPY;  Service: Gastroenterology;  Laterality: N/A;  . CARPAL TUNNEL RELEASE Left 07/02/2015   Procedure: CARPAL TUNNEL RELEASE;  Surgeon: Frederik Pear, MD;  Location: [place];  Service: Orthopedics;  Laterality: Left;  . CATARACT EXTRACTION, BILATERAL Bilateral    Oct and Nov 2017  . CORONARY ANGIOPLASTY WITH STENT PLACEMENT  06/2011   "1"  . CORONARY ARTERY BYPASS GRAFT  2005   CABG X 2  . DILATION AND CURETTAGE OF UTERUS     "more than once"  . ELBOW ARTHROSCOPY Left 07/02/2015   Procedure: ARTHROSCOPY LEFT ELBOW WITH DEBRIDEMENT AND REMOVAL LOOSE BODY;  Surgeon: Frederik Pear, MD;  Location: [place];  Service: Orthopedics;  Laterality: Left;  . ESOPHAGEAL MANOMETRY N/A 12/29/2015   Procedure: ESOPHAGEAL MANOMETRY (EM);  Surgeon: Manus Gunning, MD;  Location: [facility]OS[facility];  Service: Gastroenterology;  Laterality: N/A;  . ESOPHAGEAL MANOMETRY N/A 07/30/2019   Procedure: ESOPHAGEAL MANOMETRY (EM);  Surgeon: Yetta Flock, MD;  Location: [facility];  Service: Gastroenterology;  Laterality: N/A;  . FRACTURE SURGERY  ~ 2005   nose  .  KNEE ARTHROSCOPY  09/04/2012   Procedure: ARTHROSCOPY KNEE;  Surgeon: Gearlean Alf, MD;  Location: [facility];  Service: Orthopedics;  Laterality: Right;  right knee arthroscopy with medial and lateral meniscus debridement  . 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 MOUTH SURGERY  2004   "bone replacement; had cadavear bones put in; face was collapsing"  . NASAL SEPTUM SURGERY  ~ 1986  . TOTAL KNEE ARTHROPLASTY Right 07/07/2013   Procedure: RIGHT TOTAL KNEE ARTHROPLASTY;  Surgeon: Gearlean Alf, MD;  Location: [facility];  Service: Orthopedics;  Laterality: Right;    There were no vitals filed for this visit.  Subjective Assessment - 08/07/19 0855    Subjective  Pt reports that she is doing well today. She reports that she has had some numbness and tingling in her R shoulder down to her wrist  since last visit that comes and goes.  She reports a 3/10 irritation with sorenes and numbness and tingling in her R shoulder.  No specific questions or concerns at this time.    Pertinent History  On 08/03/18 pt was carrying a folding table and fell and right arm was under the handle got stuck between the door and the table. She landed on her R side. Tried to get up and had to pull at her arm. Pt had immediate pain in her right shoulder. She waited until Monday and went to see Dr. Edilia Bo her PCP. He took radiographs but didn't find any fractures. She continued to have pain so she called Dr. Damita Dunnings office, her orthopedist, and saw him that same week. He took additional radiographs and found a hairline fracture in her humeral shaft and a greater tuberosity fracture per patient report. She states that she is not healing well and has recurrent plain film radiographs. She is still wearing a sling and was encouraged not to perform any AROM of her right shoulder. Since her last visit with Dr. Mayer Camel on 09/20/18 she has had an additional fall and landed on her R shoulder. She reports additional bruising and pain in her R shoulder. She has a follow-up appt scheduled with her orthopedist for 10/15/18. 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 ROS negative for red flags. 06/05/19: Pt presents to physical therapy with reports of intermittent RUE numbness and tingling that began about 4 weeks ago. The numbness and tingling begins at the shoulder and extends down into the elbow. On days when it is more intense, it begins in the cervical spine. After about 5 seconds, numbness tingling subsides, and she experiences min hyperalgesia. Pt was told by physician that if numbness and tingling does not improve with conservative therapy after 3 months an MRI will be ordered. She is currently being seen in physical therapy for R shoulder pain due to recovery post greater tubercle fracture.    Limitations   Lifting    Diagnostic tests  Plain film radiographs with decreased healing per pt report    Patient Stated Goals  Improve ability to use RUE and decrease pain         TREATMENT    Therapeutic Exercise   UBE 2 min forward/2 min backward for warm-up during history;   Bilateral shoulder extension with green theraband x 15 each; tactile and verbal cueing needed for form;   Bilateral standing rows with green theraband x15 each; tactile and verbal cueing needed for form;   Standing R shoulder flexion to  90 with red theraband x 15, assist required to keep R elbow straight;   Standing RUE ER with red theraband x 15; mod tactile and verbal cueing needed for form; better form when band was moved around wrist  Standing R shoulder abduction with red theraband x 15, mod assist for elbow straight, upright posture, and proper form;        Manual Therapy    R upper trap, levator and scalene stretches 30s x 2 each;   R median nerve glides x 10;    Soft tissue mobilization to R upper trap with an oscillating technique to release trigger points.       Mechanical Cervical Traction     Intermittent, 30 degree pull angle, 30# 30s hold time, 15# 10s rest time x 10 minutes total. Pt denies any numbness/tingling at end of traction.         Pt educated throughout session about proper posture and technique with exercises. Improved exercise technique, movement at target joints, use of target muscles after min to mod verbal cues.       Pt demonstrates fair motivation during session.  She demonstrates difficulty with proper technique during shoulder exercises, with a tendency to bend her elbow and hike her shoulder during flexion and abduction exercises, requiring moderate tactile and verbal cueing throughout.  Additionally, she has difficulty with maintaining technique with ER, with a tendency to use excessive wrist extension, however improvement and less cueing was noted when the theraband was  moved to her wrist.  Next session, we will add in cervical retractions and deep neck flexor exercises. Pt will benefit from PT services to address deficits in strength, mobility, and pain in order to return to full function at home.               PT Short Term Goals - 08/05/19 1509      PT SHORT TERM GOAL #1   Title  Pt will be independent with HEP in order to improve strength and decrease pain in order to improve pain-free function at home.    Time  4    Period  Weeks    Status  Achieved        PT Long Term Goals - 08/05/19 1318      PT LONG TERM GOAL #1   Title  Pt will decrease quick DASH score to below 30% in order to demonstrate clinically significant reduction in disability.    Baseline  09/30/18: 84.1%; 10/30/18: 56.8%; 11/27/18: 45.45%; 12/23/18: 40.9%; 01/21/19: 29.5%, 6/4: 43%; 05/22/19: 43.2%; 08/05/19: 25%    Time  8    Period  Weeks    Status  Achieved      PT LONG TERM GOAL #2   Title  Pt will decrease worst shoulder pain as reported on NPRS to below 4/10 in order to demonstrate clinically significant reduction in pain.    Baseline  09/30/18: worst: 9/10; 10/30/18: 6/10; 11/27/18: 4/10; 12/23/18: 5/10 (pain is less frequent); 01/21/19: 5/10 (pain is less frequent); 03/19/19: 4/10; 6/4: 5/10; 05/22/19: 4/10 (decreased frequency of pain as well); 08/05/19: 2/10    Time  8    Period  Weeks    Status  Achieved      PT LONG TERM GOAL #3   Title  Pt will improve PROM of R shoulder to within 10 degrees of R shoulder in order to improve functional use of R shoulder for ADLs/IADLs.    Baseline  R/L 129/150 Shoulder flexion, 100/165 Shoulder  abduction, 50/93 Shoulder external rotation, 50/70 Shoulder internal rotation; 10/30/18: R/L 140/150 Shoulder flexion, 132/165 Shoulder abduction, 82/93 Shoulder external rotation, 70/70 Shoulder internal rotation; 12/23/18:  R/L 155/150 Shoulder flexion, 134/165 Shoulder abduction, 89/93 Shoulder external rotation, 70/70 Shoulder internal rotation,  01/21/19: R/L 154/150 Shoulder flexion, 136/165 Shoulder abduction, 85/93 Shoulder external rotation, 70/70 Shoulder internal rotation; 03/19/19: Unable to test over telemedicine 6/4: R: 128 flexion, 121 abduction, 78 shoulder ER, 70 shoulder IR; 05/22/19: R: 148 flexion (154 L side), 145 abduction (166 L side), 85 shoulder ER, 76 shoulder IR; 08/05/19: R flexion 150 deg (L 162) R abduction 165 (L 170); R ER 89; R IR 90    Time  8    Period  Weeks    Status  Partially Met    Target Date  09/30/19      PT LONG TERM GOAL #4   Title  Pt will improve pain-free R shoulder strength for flexion and abduction so that it is symmetrical with L shoulder in order to return to full function at home    Baseline  12/23/18: R/L 4-/4+ Shoulder flexion, 4/4+ Shoulder abduction, both painful on R side; 01/21/19: R/L 4/4+ Shoulder flexion, 4/4+ Shoulder abduction, both painful on R side; 03/19/19: Unable to test over telemedicine 6/4: 4-/4; 05/22/19: R/L: Flexion: 4/4+ (no pain bilateral) Abduction: 4-/4 (painful on R side, no pain on L side); 08/05/19: R/L flexion 4/5 (some pain on R), R/L abduction 4+/5 but no pain    Time  8    Period  Weeks    Status  Partially Met    Target Date  09/30/19      PT LONG TERM GOAL #5   Title  Pt will demonstrate decrease in NDI by at least 19% in order to demonstrate clinically significant reduction in disability related to neck injury/pain    Baseline  06/05/19: 36%; 08/05/19: 8%    Time  8    Period  Weeks    Status  Achieved            Plan - 08/07/19 1231    Clinical Impression Statement  Pt demonstrates fair motivation during session.  She demonstrates difficulty with proper technique during shoulder exercises, with a tendency to bend her elbow and hike her shoulder during flexion and abduction exercises, requiring moderate tactile and verbal cueing throughout.  Additionally, she has difficulty with maintaining technique with ER, with a tendency to use excessive wrist extension,  however improvement and less cueing was noted when the theraband was moved to her wrist.  Next session, we will add in cervical retractions and deep neck flexor exercises. Pt will benefit from PT services to address deficits in strength, mobility, and pain in order to return to full function at home.    Personal Factors and Comorbidities  Age;Comorbidity 2;Past/Current Experience    Comorbidities  afib, CAD, fibromyalgia, DM, depression    Examination-Activity Limitations  Bathing;Carry    Examination-Participation Restrictions  Cleaning;Community Activity    Stability/Clinical Decision Making  Unstable/Unpredictable    Rehab Potential  Good    PT Frequency  2x / week    PT Duration  8 weeks    PT Treatment/Interventions  ADLs/Self Care Home Management;Aquatic Therapy;Canalith Repostioning;Cryotherapy;Electrical Stimulation;Iontophoresis '4mg'$ /ml Dexamethasone;Moist Heat;Traction;Ultrasound;Contrast Bath;DME Instruction;Gait training;Stair training;Functional mobility training;Therapeutic activities;Therapeutic exercise;Balance training;Neuromuscular re-education;Patient/family education;Manual techniques;Passive range of motion;Dry needling;Splinting;Taping;Vestibular;Spinal Manipulations    PT Next Visit Plan  cervical retractions, deep neck flexor exercises, nerve glides, cervical traction, cervical mobilizations    PT Home  Exercise Plan  Cervical retractions, Scapular retractions, R upper trap stretch, supine canes for AAROM flexion with hands together progressing to AROM supine flexion, AROM sidelying shoulder abduction, AROM sidelying shoulder ER, seated pulleys for flexion and scaption ([id number])    Consulted and Agree with Plan of Care  Patient       Patient will benefit from skilled therapeutic intervention in order to improve the following deficits and impairments:  Decreased strength, Decreased range of motion, Impaired UE functional use, Pain  Visit  Diagnosis: Cervicalgia  Radiculopathy, cervical region  Stiffness of right shoulder, not elsewhere classified  Muscle weakness (generalized)     Problem List Patient Active Problem List   Diagnosis Date Noted  . Family history of breast cancer   . History of colon polyps   . Chronic diarrhea 01/14/2019  . Malignant melanoma ([place]) 11/23/2016  . Urge incontinence 11/23/2016  . Atypical mole 09/22/2016  . Slurring of speech 05/12/2016  . Traumatic arthritis elbow 05/04/2015  . Left foot pain 12/31/2014  . Severe sprain of left ankle 12/31/2014  . Osteopenia 09/15/2014  . History of TIA (transient ischemic attack) 05/19/2014  . Fracture of coronoid process of ulna, left, closed 04/25/2014  . Ingrown toenail 01/15/2014  . Obesity (BMI 30-39.9) 10/08/2013  . OA (osteoarthritis) of knee 07/07/2013  . Chronic low back pain 05/09/2013  . Obstructive sleep apnea 05/09/2013  . GERD (gastroesophageal reflux disease) 05/09/2013  . Hiatal hernia 05/09/2013  . Fibromyalgia syndrome 05/09/2013  . Depression, major, in partial remission ([place]) 05/09/2013  . RLS (restless legs syndrome) 05/09/2013  . Diabetes mellitus with no complication ([place]) 71/59/5396  . High cholesterol 05/09/2013  . 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 Family history of colon cancer 05/09/2013  . Allergic rhinitis 05/09/2013  . Lateral meniscal tear 09/04/2012  . CAD (coronary artery disease) 09/27/2011  . HTN (hypertension) 09/27/2011  . Paroxysmal A-fib ([place]) 09/27/2011    This entire session was performed under direct supervision and direction of a licensed therapist/therapist assistant . I have personally read, edited and approve of the note as written.   Lutricia Horsfall, SPT Phillips Grout PT, DPT, GCS  Huprich,Jason 08/07/2019, 2:36 PM  [place] MAIN [facility] SERVICES [address] [place], [place], [address] Phone: [phone]   Fax:  [phone]  Name: Tyshawna Alarid MRN: [id number] Date of Birth: [birth year]

## 2019-08-08 ENCOUNTER — Ambulatory Visit (INDEPENDENT_AMBULATORY_CARE_PROVIDER_SITE_OTHER): Payer: Medicare Other | Admitting: Gastroenterology

## 2019-08-08 ENCOUNTER — Telehealth: Payer: Self-pay | Admitting: Cardiology

## 2019-08-08 ENCOUNTER — Encounter: Payer: Self-pay | Admitting: Gastroenterology

## 2019-08-08 VITALS — BP 128/72 | HR 66 | Temp 98.8°F | Ht 62.0 in | Wt 155.0 lb

## 2019-08-08 DIAGNOSIS — Z951 Presence of aortocoronary bypass graft: Secondary | ICD-10-CM | POA: Diagnosis not present

## 2019-08-08 DIAGNOSIS — Z7189 Other specified counseling: Secondary | ICD-10-CM | POA: Diagnosis not present

## 2019-08-08 DIAGNOSIS — K219 Gastro-esophageal reflux disease without esophagitis: Secondary | ICD-10-CM | POA: Diagnosis not present

## 2019-08-08 DIAGNOSIS — I519 Heart disease, unspecified: Secondary | ICD-10-CM

## 2019-08-08 DIAGNOSIS — Z8673 Personal history of transient ischemic attack (TIA), and cerebral infarction without residual deficits: Secondary | ICD-10-CM

## 2019-08-08 DIAGNOSIS — K449 Diaphragmatic hernia without obstruction or gangrene: Secondary | ICD-10-CM

## 2019-08-08 NOTE — Telephone Encounter (Signed)
   Primary Cardiologist:Mark Marlou Porch, MD  Chart reviewed as part of pre-operative protocol coverage. Because of Morgan [name]'s past medical history and time since last visit, he/she will require a follow-up visit in order to better assess preoperative cardiovascular risk.  Pre-op covering staff: - Please schedule appointment and call patient to inform them. - Please contact requesting surgeon's office via preferred method (i.e, phone, fax) to inform them of need for appointment prior to surgery.  If applicable, this message will also be routed to pharmacy pool and/or primary cardiologist for input on holding anticoagulant/antiplatelet agent as requested below so that this information is available at time of patient's appointment.   [place], [place]  08/08/2019, 6:22 PM

## 2019-08-08 NOTE — Patient Instructions (Signed)
We will contact cardiology regarding getting clearance for you to have T.I.F procedure completed. Once that has been accomplished, we will proceed with T.I.F.  If you are age 77 or older, your body mass index should be between 23-30. Your Body mass index is 28.35 kg/m. If this is out of the aforementioned range listed, please consider follow up with your Primary Care Provider.  If you are age 47 or younger, your body mass index should be between 19-25. Your Body mass index is 28.35 kg/m. If this is out of the aformentioned range listed, please consider follow up with your Primary Care Provider.

## 2019-08-08 NOTE — Progress Notes (Signed)
P  Chief Complaint:    GERD, evaluation for TIF  HPI: 78 year old female with longstanding history of reflux and small hiatal hernia, referred to me by Dr. Havery Moros to discuss ongoing treatment options, to include consideration for Transoral Incisionless Fundoplication (TIF) with a goal to stop or significantly reduce acid suppression therapy.  Has trialed multiple acid suppression medications in the past.  She is currently treated with Dexilant 60 mg/day, which improves her symptoms, but with increasing breakthrough of pyrosis.  No current dysphagia.  Of note, recent DEXA with osteopenia and did have a recent fracture of left elbow.  GERD history: -Index symptoms: Regurgitation, pyrosis. +nocturnal sxs -Current medications: Dexilant 60 mg/day, Carafate -Complications: 2 cm hiatal hernia  GERD evaluation: -Last EGD: 06/2019 -Barium esophagram: 04/2018: Tiny HH, otherwise normal.  Normal motility -Esophageal Manometry: ?  Jackhammer esophagus with hypertensive LES, normal IRP, no achalasia in 2017.  Repeat a.m. this month was normal -pH/Impedance: 2017.  DeMeester score 40 with majority of reflux episodes being acidic.  SI 75%   Endoscopic history: - EGD (12/2015): Small HH, numerous fundic gland polyps - EGD (06/2019, Dr. Havery Moros): 2 cm HH, Hill Grade 2 valve, fundic gland polyps  Hx of 2V CABG approx 2006 at [facility] and PCI with drug eluding stent placement in 2012. Follows with Dr. Marlou Porch, Cardiology. Currently taking ASA 81 mg. 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    Review of systems:     No chest pain, no SOB, no fevers, no urinary sx   Past Medical History:  Diagnosis Date  . Angina   . Arthritis   . Atrial fibrillation (HCC)    ASPIRIN FOR BLOOD THINNER  . Atypical mole 09/22/2016  . Bulging discs    lumbar   . Cancer ([place])    melanoma  . Complication of anesthesia    pt states has choking sensation with ET tube   . Coronary  artery disease   . Depression   . Diabetes mellitus    diet controlled/on meds  . Family history of breast cancer   . Family history of colon cancer   . Fibromyalgia   . GERD (gastroesophageal reflux disease)   . H/O hiatal hernia   . Headache(784.0)    "recurring"  . High cholesterol   . History of colon polyps   . Hyperlipidemia   . Hypertension   . 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 Migraines    "til ~ 1980"  . PONV (postoperative nausea and vomiting)   . Restless leg syndrome   . Sciatic nerve pain    "from pinched nerve"  . Sleep apnea    uses CPAP  . Stroke [facility]) 2014   no deficits  . Weakness of right side of body    "I've had PT for it; they don't know what it's from".  CORTISONE INJECTION INTO BACK 08/30/12    Patient's surgical history, family medical history, social history, medications and allergies were all reviewed in Epic    Current Outpatient Medications  Medication Sig Dispense Refill  . aspirin EC 81 MG tablet Take 1 tablet (81 mg total) by mouth daily.    [name] atorvastatin (LIPITOR) 80 MG tablet TAKE 1 TABLET BY MOUTH EVERY DAY 90 tablet 3  . CARAFATE 1 GM/10ML suspension TAKE 10 MLS (1 G TOTAL) BY MOUTH EVERY 6 (SIX) HOURS AS NEEDED. 420 mL 3  . cetirizine (ZYRTEC) 10 MG tablet Take 10 mg by mouth daily.  Reported on 12/23/2015    . chlorthalidone (HYGROTON) 25 MG tablet Take 12.5 mg by mouth daily.    . clonazePAM (KLONOPIN) 1 MG tablet TAKE 2 TABLETS BY MOUTH AT BEDTIME 60 tablet 0  . DEXILANT 60 MG capsule TAKE 1 CAPSULE BY MOUTH EVERY DAY 90 capsule 1  . gabapentin (NEURONTIN) 300 MG capsule TAKE 1 TO 2 CAPSULES BY MOUTH AT BEDTIME 180 capsule 1  . glucose blood (FREESTYLE LITE) test strip Use to check blood sugar twice daily 125 each 3  . Insulin Pen Needle (NOVOFINE) 32G X 6 MM MISC USE TO INJECT VICTOZA DAILY. DX: E11.9 90 each 3  . Lancet Device MISC Use to check blood sugar twice daily. 1 each 0  . Lancets (FREESTYLE) lancets Use to check blood sugar twice  daily 100 each 11  . meloxicam (MOBIC) 15 MG tablet Take 15 mg by mouth daily. Reported on 12/23/2015    . metFORMIN (GLUCOPHAGE-XR) 500 MG 24 hr tablet TAKE 2 TABLETS BY MOUTH TWICE A DAY 360 tablet 3  . metoprolol succinate (TOPROL-XL) 100 MG 24 hr tablet TAKE 1 TABLET (100 MG TOTAL) BY MOUTH DAILY. TAKE WITH OR IMMEDIATELY FOLLOWING A MEAL. 90 tablet 1  . Multiple Vitamins-Minerals (PRESERVISION AREDS PO) Take 1 tablet by mouth 2 (two) times daily. Reported on 12/23/2015    . MYRBETRIQ 25 MG TB24 tablet TAKE 1 TABLET BY MOUTH EVERY DAY 90 tablet 1  . nitroGLYCERIN (NITROSTAT) 0.4 MG SL tablet PLACE 1 TABLET (0.4 MG TOTAL) UNDER THE TONGUE EVERY 5 (FIVE) MINUTES AS NEEDED FOR CHEST PAIN 25 tablet 0  . tiZANidine (ZANAFLEX) 4 MG tablet TAKE 1 TABLET BY MOUTH EVERY DAY IN THE EVENING 30 tablet 0  . traMADol (ULTRAM) 50 MG tablet Take 50 mg by mouth every 6 (six) hours as needed. for pain  0  . venlafaxine XR (EFFEXOR-XR) 37.5 MG 24 hr capsule TAKE 1 CAPSULE (37.5 MG TOTAL) BY MOUTH 2 (TWO) TIMES DAILY. 180 capsule 1  . VICTOZA 18 MG/3ML SOPN INJECT 1.8 MG INTO THE SKIN DAILY. 27 pen 3   No current facility-administered medications for this visit.     Physical Exam:     BP 128/72 (BP Location: Left Arm, Patient Position: Sitting, Cuff Size: Normal)   Pulse 66   Temp 98.8 F (37.1 C) (Oral)   Ht 5\' 2"  (1.575 m)   Wt 155 lb (70.3 kg)   BMI 28.35 kg/m   GENERAL:  Pleasant female in NAD PSYCH: : Cooperative, normal affect EENT:  conjunctiva pink, mucous membranes moist, neck supple without masses CARDIAC:  RRR, no murmur heard, no peripheral edema PULM: Normal respiratory effort, lungs CTA bilaterally, no wheezing ABDOMEN:  Nondistended, soft, nontender. No obvious masses, no hepatomegaly,  normal bowel sounds SKIN:  turgor, no lesions seen Musculoskeletal:  Normal muscle tone, normal strength NEURO: Alert and oriented x 3, no focal neurologic deficits   IMPRESSION and PLAN:    1) GERD  2) Hiatal hernia  Docia Huckeby is a 78 y.o. female with a long-standing history of GERD, incompletely responsive to PPI therapy and requesting antireflux surgery with goal of improved/resolved symptoms and stopping acid suppression medications. Discussed the pathophysiology of GERD at length, to include the risks of untreated reflux (ie, strictures, Barrett's Esophagus, EAC, etc) as well as the possible treatment with medications vs antireflux surgery. In particular, we discussed the risks, benefits, and alternatives of Transoral Incisionless Fundoplication (TIF), to include Nissen fundoplication, and the patient wishes  to proceed with TIF.   - Will schedule for TIF at [facility] with plan for subsequent admission to the Hospitalist Service overnight for post-operative observation pending Cardiology evaluation/clearance as outlined below - NPO at MN prior to the procedure - Confirmed allergies with the patient and no no allergies to planned perioperative antibiotics - Reviewed postoperative dietary and activity restrictions with patient and provided handout - Anticipated 23 hour stay  - Additional recommendations regarding medications and post-operative diet to follow TIF completion  - Sincerely appreciate the assistance by the Hospitalist Service for the inpatient management and overnight observation of this patient  3) Chronic PPI use: I have reviewed the indications, risks, and benefits of PPI therapy with the patient today. I have discussed studies that raise ? of increased osteoporosis, dementia, and kidney failure and explained that these studies show very weak associations of unclear significance and not clear cause and effect. We did discuss the potential for vitamin malabsorption, to include magnesium (very rare), calcium (easily modifiable with Calcium Citrate supplement), vitamin B12 (again, correctable with oral B12 supplement), and iron (although rarely clinically significant  outside patients who require iron supplementation previously).  4) CAD 5) History of two-vessel CABG 6) History of PCI post CABG 7) History of TIA 8) Hypertension  - Requested that she follow-up with her Cardiologist for evaluation and preoperative clearance for this elective, moderate risk procedure -Pending Cardiology agreement, plan for holding aspirin x5 days preoperative;y      I spent well over 40 minutes of face-to-face time with the patient. Greater than 50% of the time was spent counseling and coordinating care.      Lavena Bullion ,DO, FACG 08/08/2019, 2:58 PM  CC: Carlota Raspberry. Havery Moros, MD Jinny Sanders, MD

## 2019-08-08 NOTE — Telephone Encounter (Signed)
New message      [facility] Pre-operative Risk Assessment    Request for surgical clearance:  1. What type of surgery is being performed?Tif Procedure    2. When is this surgery scheduled? TBD  3. What type of clearance is required (medical clearance vs. Pharmacy clearance to hold med vs. Both)? medical  4. Are there any medications that need to be held prior to surgery and how long?Aspirin 5 days prior  5. Practice name and name of physician performing surgery? [facility]   6. What is your office phone [phone]   7.   What is your office fax number [phone]  8.   Anesthesia type (None, local, MAC, general) ? MAC   Maryjane Hurter 08/08/2019, 3:18 PM  _________________________________________________________________   (provider comments below)

## 2019-08-11 DIAGNOSIS — R0989 Other specified symptoms and signs involving the circulatory and respiratory systems: Secondary | ICD-10-CM

## 2019-08-11 DIAGNOSIS — R1310 Dysphagia, unspecified: Secondary | ICD-10-CM

## 2019-08-11 NOTE — Telephone Encounter (Signed)
Appt scheduled for 10/22 with Dr. Marlou Porch.

## 2019-08-12 ENCOUNTER — Ambulatory Visit: Payer: Medicare Other

## 2019-08-12 ENCOUNTER — Other Ambulatory Visit: Payer: Self-pay

## 2019-08-12 DIAGNOSIS — M542 Cervicalgia: Secondary | ICD-10-CM | POA: Diagnosis not present

## 2019-08-12 DIAGNOSIS — M5412 Radiculopathy, cervical region: Secondary | ICD-10-CM

## 2019-08-12 DIAGNOSIS — M25611 Stiffness of right shoulder, not elsewhere classified: Secondary | ICD-10-CM

## 2019-08-12 NOTE — Therapy (Signed)
[place] MAIN [facility] SERVICES [address] [place], [place], [address] Phone: [phone]   Fax:  [phone]  Physical Therapy Treatment  Patient Details  Name: Britlee Skolnik MRN: [id number] Date of Birth: 08/31/1941 Referring Provider (PT): Dr. Mayer Camel   Encounter Date: 08/12/2019  PT End of Session - 08/12/19 1347    Visit Number  52    Number of Visits  81    Date for PT Re-Evaluation  09/30/19    Authorization Type  goals updated 08/05/19    PT Start Time  1308    PT Stop Time  1355    PT Time Calculation (min)  47 min    Activity Tolerance  Patient tolerated treatment well    Behavior During Therapy  [facility] for tasks assessed/performed       Past Medical History:  Diagnosis Date  . Angina   . Arthritis   . Atrial fibrillation (HCC)    ASPIRIN FOR BLOOD THINNER  . Atypical mole 09/22/2016  . Bulging discs    lumbar   . Cancer ([place])    melanoma  . Complication of anesthesia    pt states has choking sensation with ET tube   . Coronary artery disease   . Depression   . Diabetes mellitus    diet controlled/on meds  . Family history of breast cancer   . Family history of colon cancer   . Fibromyalgia   . GERD (gastroesophageal reflux disease)   . H/O hiatal hernia   . Headache(784.0)    "recurring"  . High cholesterol   . History of colon polyps   . Hyperlipidemia   . Hypertension   . 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 Migraines    "til ~ 1980"  . PONV (postoperative nausea and vomiting)   . Restless leg syndrome   . Sciatic nerve pain    "from pinched nerve"  . Sleep apnea    uses CPAP  . Stroke [facility]) 2014   no deficits  . Weakness of right side of body    "I've had PT for it; they don't know what it's from".  CORTISONE INJECTION INTO BACK 08/30/12    Past Surgical History:  Procedure Laterality Date  . [place] STUDY N/A 12/29/2015   Procedure: [place] STUDY;  Surgeon: Manus Gunning, MD;  Location: [facility]  ENDOSCOPY;  Service: Gastroenterology;  Laterality: N/A;  . CARPAL TUNNEL RELEASE Left 07/02/2015   Procedure: CARPAL TUNNEL RELEASE;  Surgeon: Frederik Pear, MD;  Location: [place];  Service: Orthopedics;  Laterality: Left;  . CATARACT EXTRACTION, BILATERAL Bilateral    Oct and Nov 2017  . CORONARY ANGIOPLASTY WITH STENT PLACEMENT  06/2011   "1"  . CORONARY ARTERY BYPASS GRAFT  2005   CABG X 2  . DILATION AND CURETTAGE OF UTERUS     "more than once"  . ELBOW ARTHROSCOPY Left 07/02/2015   Procedure: ARTHROSCOPY LEFT ELBOW WITH DEBRIDEMENT AND REMOVAL LOOSE BODY;  Surgeon: Frederik Pear, MD;  Location: [place];  Service: Orthopedics;  Laterality: Left;  . ESOPHAGEAL MANOMETRY N/A 12/29/2015   Procedure: ESOPHAGEAL MANOMETRY (EM);  Surgeon: Manus Gunning, MD;  Location: [facility] ENDOSCOPY;  Service: Gastroenterology;  Laterality: N/A;  . ESOPHAGEAL MANOMETRY N/A 07/30/2019   Procedure: ESOPHAGEAL MANOMETRY (EM);  Surgeon: Yetta Flock, MD;  Location: [facility] ENDOS[facility];  Service: Gastroenterology;  Laterality: N/A;  . FRACTURE SURGERY  ~ 2005   nose  .  KNEE ARTHROSCOPY  09/04/2012   Procedure: ARTHROSCOPY KNEE;  Surgeon: Gearlean Alf, MD;  Location: [facility] ORS;  Service: Orthopedics;  Laterality: Right;  right knee arthroscopy with medial and lateral meniscus debridement  . 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 MOUTH SURGERY  2004   "bone replacement; had cadavear bones put in; face was collapsing"  . NASAL SEPTUM SURGERY  ~ 1986  . TOTAL KNEE ARTHROPLASTY Right 07/07/2013   Procedure: RIGHT TOTAL KNEE ARTHROPLASTY;  Surgeon: Gearlean Alf, MD;  Location: [facility];  Service: Orthopedics;  Laterality: Right;    There were no vitals filed for this visit.  Subjective Assessment - 08/12/19 1311    Subjective  Pt reports that she is doing well today.  She reports that she was sore for a few days last week after PT, and that it has not fully  resolved yet.  She reports that she has had less numbness and tingling in her R shoulder last visit, and that comes and goes.  No specific questions or concerns at this time.    Pertinent History  On 08/03/18 pt was carrying a folding table and fell and right arm was under the handle got stuck between the door and the table. She landed on her R side. Tried to get up and had to pull at her arm. Pt had immediate pain in her right shoulder. She waited until Monday and went to see Dr. Edilia Bo her PCP. He took radiographs but didn't find any fractures. She continued to have pain so she called Dr. Damita Dunnings office, her orthopedist, and saw him that same week. He took additional radiographs and found a hairline fracture in her humeral shaft and a greater tuberosity fracture per patient report. She states that she is not healing well and has recurrent plain film radiographs. She is still wearing a sling and was encouraged not to perform any AROM of her right shoulder. Since her last visit with Dr. Mayer Camel on 09/20/18 she has had an additional fall and landed on her R shoulder. She reports additional bruising and pain in her R shoulder. She has a follow-up appt scheduled with her orthopedist for 10/15/18. 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 ROS negative for red flags. 06/05/19: Pt presents to physical therapy with reports of intermittent RUE numbness and tingling that began about 4 weeks ago. The numbness and tingling begins at the shoulder and extends down into the elbow. On days when it is more intense, it begins in the cervical spine. After about 5 seconds, numbness tingling subsides, and she experiences min hyperalgesia. Pt was told by physician that if numbness and tingling does not improve with conservative therapy after 3 months an MRI will be ordered. She is currently being seen in physical therapy for R shoulder pain due to recovery post greater tubercle fracture.    Limitations  Lifting     Diagnostic tests  Plain film radiographs with decreased healing per pt report    Patient Stated Goals  Improve ability to use RUE and decrease pain    Currently in Pain?  No/denies        TREATMENT      Therapeutic Exercise     UBE 2 min forward/2 min backward for warm-up during history;     Bilateral shoulder extension with green theraband x 15 each; tactile and verbal cueing needed for form;   Bilateral standing rows with green  theraband x15 each; tactile and verbal cueing needed for form;     Standing R shoulder flexion to 90 with green theraband x 10, assist required to keep R elbow straight;     Standing RUE ER with red theraband x 15; mod tactile and verbal cueing needed for form; better form when band was moved around wrist    Standing R shoulder abduction with red theraband x 15, mod assist for elbow straight, upright posture, and proper form;      Deep Neck Flexor exercises x10   Cervical Retractions x10 with 3 sec hold; requires moderate verbal and tactile cueing for technique            Manual Therapy      R upper trap, levator and scalene stretches 30s x 2 each;            Mechanical Cervical Traction       Intermittent, 30 degree pull angle, 30# 30s hold time, 15# 10s rest time x 15 minutes total. Pt denies any numbness/tingling at end of traction.            Pt educated throughout session about proper posture and technique with exercises. Improved exercise technique, movement at target joints, use of target muscles after min to mod verbal cues.          Pt demonstrates fair motivation during session.  She continues to demonstrate difficulty with proper technique during shoulder exercises, continuing to bend her elbow and hike her shoulder during flexion and abduction exercises, requiring moderate tactile and verbal cueing throughout.  She requires moderate verbal and tactile cueing for cervical retractions in order to maintain proper technique. Pt will  benefit from PT services to address deficits in strength, mobility, and pain in order to return to full function at home.              PT Short Term Goals - 08/05/19 1509      PT SHORT TERM GOAL #1   Title  Pt will be independent with HEP in order to improve strength and decrease pain in order to improve pain-free function at home.    Time  4    Period  Weeks    Status  Achieved        PT Long Term Goals - 08/05/19 1318      PT LONG TERM GOAL #1   Title  Pt will decrease quick DASH score to below 30% in order to demonstrate clinically significant reduction in disability.    Baseline  09/30/18: 84.1%; 10/30/18: 56.8%; 11/27/18: 45.45%; 12/23/18: 40.9%; 01/21/19: 29.5%, 6/4: 43%; 05/22/19: 43.2%; 08/05/19: 25%    Time  8    Period  Weeks    Status  Achieved      PT LONG TERM GOAL #2   Title  Pt will decrease worst shoulder pain as reported on NPRS to below 4/10 in order to demonstrate clinically significant reduction in pain.    Baseline  09/30/18: worst: 9/10; 10/30/18: 6/10; 11/27/18: 4/10; 12/23/18: 5/10 (pain is less frequent); 01/21/19: 5/10 (pain is less frequent); 03/19/19: 4/10; 6/4: 5/10; 05/22/19: 4/10 (decreased frequency of pain as well); 08/05/19: 2/10    Time  8    Period  Weeks    Status  Achieved      PT LONG TERM GOAL #3   Title  Pt will improve PROM of R shoulder to within 10 degrees of R shoulder in order to improve functional use of R shoulder for  ADLs/IADLs.    Baseline  R/L 129/150 Shoulder flexion, 100/165 Shoulder abduction, 50/93 Shoulder external rotation, 50/70 Shoulder internal rotation; 10/30/18: R/L 140/150 Shoulder flexion, 132/165 Shoulder abduction, 82/93 Shoulder external rotation, 70/70 Shoulder internal rotation; 12/23/18:  R/L 155/150 Shoulder flexion, 134/165 Shoulder abduction, 89/93 Shoulder external rotation, 70/70 Shoulder internal rotation, 01/21/19: R/L 154/150 Shoulder flexion, 136/165 Shoulder abduction, 85/93 Shoulder external rotation, 70/70  Shoulder internal rotation; 03/19/19: Unable to test over telemedicine 6/4: R: 128 flexion, 121 abduction, 78 shoulder ER, 70 shoulder IR; 05/22/19: R: 148 flexion (154 L side), 145 abduction (166 L side), 85 shoulder ER, 76 shoulder IR; 08/05/19: R flexion 150 deg (L 162) R abduction 165 (L 170); R ER 89; R IR 90    Time  8    Period  Weeks    Status  Partially Met    Target Date  09/30/19      PT LONG TERM GOAL #4   Title  Pt will improve pain-free R shoulder strength for flexion and abduction so that it is symmetrical with L shoulder in order to return to full function at home    Baseline  12/23/18: R/L 4-/4+ Shoulder flexion, 4/4+ Shoulder abduction, both painful on R side; 01/21/19: R/L 4/4+ Shoulder flexion, 4/4+ Shoulder abduction, both painful on R side; 03/19/19: Unable to test over telemedicine 6/4: 4-/4; 05/22/19: R/L: Flexion: 4/4+ (no pain bilateral) Abduction: 4-/4 (painful on R side, no pain on L side); 08/05/19: R/L flexion 4/5 (some pain on R), R/L abduction 4+/5 but no pain    Time  8    Period  Weeks    Status  Partially Met    Target Date  09/30/19      PT LONG TERM GOAL #5   Title  Pt will demonstrate decrease in NDI by at least 19% in order to demonstrate clinically significant reduction in disability related to neck injury/pain    Baseline  06/05/19: 36%; 08/05/19: 8%    Time  8    Period  Weeks    Status  Achieved            Plan - 08/12/19 1346    Clinical Impression Statement  Pt demonstrates fair motivation during session.  She continues to demonstrate difficulty with proper technique during shoulder exercises, continuing to bend her elbow and hike her shoulder during flexion and abduction exercises, requiring moderate tactile and verbal cueing throughout.  She requires moderate verbal and tactile cueing for cervical retractions in order to maintain proper technique. Pt will benefit from PT services to address deficits in strength, mobility, and pain in order to return to  full function at home.    Personal Factors and Comorbidities  Age;Comorbidity 2;Past/Current Experience    Comorbidities  afib, CAD, fibromyalgia, DM, depression    Examination-Activity Limitations  Bathing;Carry    Examination-Participation Restrictions  Cleaning;Community Activity    Stability/Clinical Decision Making  Unstable/Unpredictable    Rehab Potential  Good    PT Frequency  2x / week    PT Duration  8 weeks    PT Treatment/Interventions  ADLs/Self Care Home Management;Aquatic Therapy;Canalith Repostioning;Cryotherapy;Electrical Stimulation;Iontophoresis '4mg'$ /ml Dexamethasone;Moist Heat;Traction;Ultrasound;Contrast Bath;DME Instruction;Gait training;Stair training;Functional mobility training;Therapeutic activities;Therapeutic exercise;Balance training;Neuromuscular re-education;Patient/family education;Manual techniques;Passive range of motion;Dry needling;Splinting;Taping;Vestibular;Spinal Manipulations    PT Next Visit Plan  cervical retractions, deep neck flexor exercises, nerve glides, cervical traction, cervical mobilizations    PT Home Exercise Plan  Cervical retractions, Scapular retractions, R upper trap stretch, supine canes for AAROM flexion with hands  together progressing to AROM supine flexion, AROM sidelying shoulder abduction, AROM sidelying shoulder ER, seated pulleys for flexion and scaption ([id number])    Consulted and Agree with Plan of Care  Patient       Patient will benefit from skilled therapeutic intervention in order to improve the following deficits and impairments:  Decreased strength, Decreased range of motion, Impaired UE functional use, Pain  Visit Diagnosis: Cervicalgia  Radiculopathy, cervical region  Stiffness of right shoulder, not elsewhere classified     Problem List Patient Active Problem List   Diagnosis Date Noted  . Dysphagia   . Globus sensation   . Family history of breast cancer   . History of colon polyps   . Chronic diarrhea  01/14/2019  . Malignant melanoma ([place]) 11/23/2016  . Urge incontinence 11/23/2016  . Atypical mole 09/22/2016  . Slurring of speech 05/12/2016  . Traumatic arthritis elbow 05/04/2015  . Left foot pain 12/31/2014  . Severe sprain of left ankle 12/31/2014  . Osteopenia 09/15/2014  . History of TIA (transient ischemic attack) 05/19/2014  . Fracture of coronoid process of ulna, left, closed 04/25/2014  . Ingrown toenail 01/15/2014  . Obesity (BMI 30-39.9) 10/08/2013  . OA (osteoarthritis) of knee 07/07/2013  . Chronic low back pain 05/09/2013  . Obstructive sleep apnea 05/09/2013  . GERD (gastroesophageal reflux disease) 05/09/2013  . Hiatal hernia 05/09/2013  . Fibromyalgia syndrome 05/09/2013  . Depression, major, in partial remission ([place]) 05/09/2013  . RLS (restless legs syndrome) 05/09/2013  . Diabetes mellitus with no complication ([place]) 61/16/4353  . High cholesterol 05/09/2013  . 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 Family history of colon cancer 05/09/2013  . Allergic rhinitis 05/09/2013  . Lateral meniscal tear 09/04/2012  . CAD (coronary artery disease) 09/27/2011  . HTN (hypertension) 09/27/2011  . Paroxysmal A-fib ([place]) 09/27/2011    This entire session was performed under direct supervision and direction of a licensed therapist/therapist assistant . I have personally read, edited and approve of the note as written.   Lutricia Horsfall, SPT [facility]out PT, DPT, GCS  Huprich,Jason 08/13/2019, 1:23 PM  [place] MAIN [facility] SERVICES [address] [place], [place], [address] Phone: [phone]   Fax:  [phone]  Name: Mirielle Byrum MRN: [id number] Date of Birth: 07-Jul-1941

## 2019-08-13 ENCOUNTER — Other Ambulatory Visit: Payer: Self-pay | Admitting: Family Medicine

## 2019-08-13 DIAGNOSIS — E119 Type 2 diabetes mellitus without complications: Secondary | ICD-10-CM

## 2019-08-14 ENCOUNTER — Ambulatory Visit: Payer: Medicare Other | Attending: Orthopedic Surgery

## 2019-08-14 ENCOUNTER — Other Ambulatory Visit: Payer: Self-pay

## 2019-08-14 ENCOUNTER — Other Ambulatory Visit: Payer: Self-pay | Admitting: Family Medicine

## 2019-08-14 DIAGNOSIS — M6281 Muscle weakness (generalized): Secondary | ICD-10-CM | POA: Diagnosis present

## 2019-08-14 DIAGNOSIS — M25611 Stiffness of right shoulder, not elsewhere classified: Secondary | ICD-10-CM | POA: Diagnosis present

## 2019-08-14 DIAGNOSIS — M5412 Radiculopathy, cervical region: Secondary | ICD-10-CM | POA: Diagnosis present

## 2019-08-14 DIAGNOSIS — M25511 Pain in right shoulder: Secondary | ICD-10-CM | POA: Diagnosis present

## 2019-08-14 DIAGNOSIS — M542 Cervicalgia: Secondary | ICD-10-CM | POA: Insufficient documentation

## 2019-08-14 NOTE — Therapy (Signed)
[place] MAIN [facility] SERVICES [address] [place], [place], [address] Phone: [phone]   Fax:  [phone]  Physical Therapy Treatment  Patient Details  Name: Morgan [name] MRN: [id number] Date of Birth: Apr 09, 1941 Referring Provider (PT): Dr. Mayer Camel   Encounter Date: 08/14/2019  PT End of Session - 08/14/19 0950    Visit Number  [id number]    Number of Visits  47    Date for PT Re-Evaluation  09/30/19    Authorization Type  goals updated 08/05/19    PT Start Time  0900    PT Stop Time  0930    PT Time Calculation (min)  30 min    Activity Tolerance  Patient tolerated treatment well    Behavior During Therapy  [facility] for tasks assessed/performed       Past Medical History:  Diagnosis Date  . Angina   . Arthritis   . Atrial fibrillation (HCC)    ASPIRIN FOR BLOOD THINNER  . Atypical mole 09/22/2016  . Bulging discs    lumbar   . Cancer ([place])    melanoma  . Complication of anesthesia    pt states has choking sensation with ET tube   . Coronary artery disease   . Depression   . Diabetes mellitus    diet controlled/on meds  . Family history of breast cancer   . Family history of colon cancer   . Fibromyalgia   . GERD (gastroesophageal reflux disease)   . H/O hiatal hernia   . Headache(784.0)    "recurring"  . High cholesterol   . History of colon polyps   . Hyperlipidemia   . Hypertension   . 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 Migraines    "til ~ 1980"  . PONV (postoperative nausea and vomiting)   . Restless leg syndrome   . Sciatic nerve pain    "from pinched nerve"  . Sleep apnea    uses CPAP  . Stroke [facility]) 2014   no deficits  . Weakness of right side of body    "I've had PT for it; they don't know what it's from".  CORTISONE INJECTION INTO BACK 08/30/12    Past Surgical History:  Procedure Laterality Date  . [place] STUDY N/A 12/29/2015   Procedure: [place] STUDY;  Surgeon: Manus Gunning, MD;  Location: [facility]  ENDOSCOPY;  Service: Gastroenterology;  Laterality: N/A;  . CARPAL TUNNEL RELEASE Left 07/02/2015   Procedure: CARPAL TUNNEL RELEASE;  Surgeon: Frederik Pear, MD;  Location: [place];  Service: Orthopedics;  Laterality: Left;  . CATARACT EXTRACTION, BILATERAL Bilateral    Oct and Nov 2017  . CORONARY ANGIOPLASTY WITH STENT PLACEMENT  06/2011   "1"  . CORONARY ARTERY BYPASS GRAFT  2005   CABG X 2  . DILATION AND CURETTAGE OF UTERUS     "more than once"  . ELBOW ARTHROSCOPY Left 07/02/2015   Procedure: ARTHROSCOPY LEFT ELBOW WITH DEBRIDEMENT AND REMOVAL LOOSE BODY;  Surgeon: Frederik Pear, MD;  Location: [place];  Service: Orthopedics;  Laterality: Left;  . ESOPHAGEAL MANOMETRY N/A 12/29/2015   Procedure: ESOPHAGEAL MANOMETRY (EM);  Surgeon: Manus Gunning, MD;  Location: [facility] ENDOSCOPY;  Service: Gastroenterology;  Laterality: N/A;  . ESOPHAGEAL MANOMETRY N/A 07/30/2019   Procedure: ESOPHAGEAL MANOMETRY (EM);  Surgeon: Yetta Flock, MD;  Location: [facility] ENDOSCOPY;  Service: Gastroenterology;  Laterality: N/A;  . FRACTURE SURGERY  ~ 2005   nose  .  KNEE ARTHROSCOPY  09/04/2012   Procedure: ARTHROSCOPY KNEE;  Surgeon: Gearlean Alf, MD;  Location: [facility] ORS;  Service: Orthopedics;  Laterality: Right;  right knee arthroscopy with medial and lateral meniscus debridement  . 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 MOUTH SURGERY  2004   "bone replacement; had cadavear bones put in; face was collapsing"  . NASAL SEPTUM SURGERY  ~ 1986  . TOTAL KNEE ARTHROPLASTY Right 07/07/2013   Procedure: RIGHT TOTAL KNEE ARTHROPLASTY;  Surgeon: Gearlean Alf, MD;  Location: [facility] OR[facility];  Service: Orthopedics;  Laterality: Right;    There were no vitals filed for this visit.  Subjective Assessment - 08/14/19 0903    Subjective  Pt reports that she is doing well today.  Denies any pain or soreness upon arrival today. She states that she has felt better over  the last couple days with no numbness/tingling.  She reports that she has had less numbness and tingling in her R shoulder last visit, and that comes and goes.  No specific questions or concerns at this time.    Pertinent History  On 08/03/18 pt was carrying a folding table and fell and right arm was under the handle got stuck between the door and the table. She landed on her R side. Tried to get up and had to pull at her arm. Pt had immediate pain in her right shoulder. She waited until Monday and went to see Dr. Edilia Bo her PCP. He took radiographs but didn't find any fractures. She continued to have pain so she called Dr. Damita Dunnings office, her orthopedist, and saw him that same week. He took additional radiographs and found a hairline fracture in her humeral shaft and a greater tuberosity fracture per patient report. She states that she is not healing well and has recurrent plain film radiographs. She is still wearing a sling and was encouraged not to perform any AROM of her right shoulder. Since her last visit with Dr. Mayer Camel on 09/20/18 she has had an additional fall and landed on her R shoulder. She reports additional bruising and pain in her R shoulder. She has a follow-up appt scheduled with her orthopedist for 10/15/18. 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 ROS negative for red flags. 06/05/19: Pt presents to physical therapy with reports of intermittent RUE numbness and tingling that began about 4 weeks ago. The numbness and tingling begins at the shoulder and extends down into the elbow. On days when it is more intense, it begins in the cervical spine. After about 5 seconds, numbness tingling subsides, and she experiences min hyperalgesia. Pt was told by physician that if numbness and tingling does not improve with conservative therapy after 3 months an MRI will be ordered. She is currently being seen in physical therapy for R shoulder pain due to recovery post greater tubercle  fracture.    Limitations  Lifting    Diagnostic tests  Plain film radiographs with decreased healing per pt report    Patient Stated Goals  Improve ability to use RUE and decrease pain    Currently in Pain?  No/denies           TREATMENT   Manual Therapy UBE 12mn forward/222m backward for warm-up during history; R C4-6 unilateral opening mobilizations, grade II-III, 30s/bout x 1 bout/level; R median nerve glides x 10; Repeated cervical retractions with overpressure by therapist x 10, cues to slightly increase cervical nod;  R 1st rib mobilizations, grade III, 20s/bout x 2 bouts;   Mechanical Cervical Traction Intermittent, 30 degree pull angle,30# 30s hold time, 15# 10s rest time x 12 minutes total. Moist heat pack applied around shoulder during traction Pt denies any numbness/tingling at end of traction.   Trigger Point Dry Needling (TDN), unbilled Education performed with patient regarding potential benefit of TDN. Pt provided verbal consent to treatment. TDN performed to bilateral upper traps with 4, 0.30 x 50 single needle placements with local twitch response (LTR) during all placements. Pistoning technique utilized..     Pt educated throughout session about proper posture and technique with exercises. Improved exercise technique, movement at target joints, use of target muscles after min to mod verbal cues.   Pt arrived late for her appointment so session was limited today. Performed TDN to bilateral upper traps with significant twitch response. Median nerve glides demonstrates gradual increase in ROM with repetition. She reports some reproduction of RUE numbness/tingling with first rib mobilizations No RUE N/T reported at end of session. Pt will benefit from PT services to address deficits in strength, mobility, and pain in order to return to full function at home.                      PT Short Term Goals - 08/05/19 1509      PT SHORT  TERM GOAL #1   Title  Pt will be independent with HEP in order to improve strength and decrease pain in order to improve pain-free function at home.    Time  4    Period  Weeks    Status  Achieved        PT Long Term Goals - 08/05/19 1318      PT LONG TERM GOAL #1   Title  Pt will decrease quick DASH score to below 30% in order to demonstrate clinically significant reduction in disability.    Baseline  09/30/18: 84.1%; 10/30/18: 56.8%; 11/27/18: 45.45%; 12/23/18: 40.9%; 01/21/19: 29.5%, 6/4: 43%; 05/22/19: 43.2%; 08/05/19: 25%    Time  8    Period  Weeks    Status  Achieved      PT LONG TERM GOAL #2   Title  Pt will decrease worst shoulder pain as reported on NPRS to below 4/10 in order to demonstrate clinically significant reduction in pain.    Baseline  09/30/18: worst: 9/10; 10/30/18: 6/10; 11/27/18: 4/10; 12/23/18: 5/10 (pain is less frequent); 01/21/19: 5/10 (pain is less frequent); 03/19/19: 4/10; 6/4: 5/10; 05/22/19: 4/10 (decreased frequency of pain as well); 08/05/19: 2/10    Time  8    Period  Weeks    Status  Achieved      PT LONG TERM GOAL #3   Title  Pt will improve PROM of R shoulder to within 10 degrees of R shoulder in order to improve functional use of R shoulder for ADLs/IADLs.    Baseline  R/L 129/150 Shoulder flexion, 100/165 Shoulder abduction, 50/93 Shoulder external rotation, 50/70 Shoulder internal rotation; 10/30/18: R/L 140/150 Shoulder flexion, 132/165 Shoulder abduction, 82/93 Shoulder external rotation, 70/70 Shoulder internal rotation; 12/23/18:  R/L 155/150 Shoulder flexion, 134/165 Shoulder abduction, 89/93 Shoulder external rotation, 70/70 Shoulder internal rotation, 01/21/19: R/L 154/150 Shoulder flexion, 136/165 Shoulder abduction, 85/93 Shoulder external rotation, 70/70 Shoulder internal rotation; 03/19/19: Unable to test over telemedicine 6/4: R: 128 flexion, 121 abduction, 78 shoulder ER, 70 shoulder IR; 05/22/19: R: 148 flexion (154 L side), 145 abduction (166 L side),  85 shoulder ER, 76 shoulder IR; 08/05/19: R flexion 150 deg (L 162) R abduction 165 (L 170); R ER 89; R IR 90    Time  8    Period  Weeks    Status  Partially Met    Target Date  09/30/19      PT LONG TERM GOAL #4   Title  Pt will improve pain-free R shoulder strength for flexion and abduction so that it is symmetrical with L shoulder in order to return to full function at home    Baseline  12/23/18: R/L 4-/4+ Shoulder flexion, 4/4+ Shoulder abduction, both painful on R side; 01/21/19: R/L 4/4+ Shoulder flexion, 4/4+ Shoulder abduction, both painful on R side; 03/19/19: Unable to test over telemedicine 6/4: 4-/4; 05/22/19: R/L: Flexion: 4/4+ (no pain bilateral) Abduction: 4-/4 (painful on R side, no pain on L side); 08/05/19: R/L flexion 4/5 (some pain on R), R/L abduction 4+/5 but no pain    Time  8    Period  Weeks    Status  Partially Met    Target Date  09/30/19      PT LONG TERM GOAL #5   Title  Pt will demonstrate decrease in NDI by at least 19% in order to demonstrate clinically significant reduction in disability related to neck injury/pain    Baseline  06/05/19: 36%; 08/05/19: 8%    Time  8    Period  Weeks    Status  Achieved            Plan - 08/14/19 0950    Clinical Impression Statement  Pt arrived late for her appointment so session was limited today. Performed TDN to bilateral upper traps with significant twitch response. Median nerve glides demonstrates gradual increase in ROM with repetition. She reports some reproduction of RUE numbness/tingling with first rib mobilizations No RUE N/T reported at end of session. Pt will benefit from PT services to address deficits in strength, mobility, and pain in order to return to full function at home.    Personal Factors and Comorbidities  Age;Comorbidity 2;Past/Current Experience    Comorbidities  afib, CAD, fibromyalgia, DM, depression    Examination-Activity Limitations  Bathing;Carry    Examination-Participation Restrictions   Cleaning;Community Activity    Stability/Clinical Decision Making  Unstable/Unpredictable    Rehab Potential  Good    PT Frequency  2x / week    PT Duration  8 weeks    PT Treatment/Interventions  ADLs/Self Care Home Management;Aquatic Therapy;Canalith Repostioning;Cryotherapy;Electrical Stimulation;Iontophoresis '4mg'$ /ml Dexamethasone;Moist Heat;Traction;Ultrasound;Contrast Bath;DME Instruction;Gait training;Stair training;Functional mobility training;Therapeutic activities;Therapeutic exercise;Balance training;Neuromuscular re-education;Patient/family education;Manual techniques;Passive range of motion;Dry needling;Splinting;Taping;Vestibular;Spinal Manipulations    PT Next Visit Plan  cervical retractions, deep neck flexor exercises, nerve glides, cervical traction, cervical mobilizations    PT Home Exercise Plan  Cervical retractions, Scapular retractions, R upper trap stretch, supine canes for AAROM flexion with hands together progressing to AROM supine flexion, AROM sidelying shoulder abduction, AROM sidelying shoulder ER, seated pulleys for flexion and scaption ([id number])    Consulted and Agree with Plan of Care  Patient       Patient will benefit from skilled therapeutic intervention in order to improve the following deficits and impairments:  Decreased strength, Decreased range of motion, Impaired UE functional use, Pain  Visit Diagnosis: Cervicalgia  Radiculopathy, cervical region  Muscle weakness (generalized)     Problem List Patient Active Problem List   Diagnosis Date Noted  . Dysphagia   . Globus sensation   . Family history  of breast cancer   . History of colon polyps   . Chronic diarrhea 01/14/2019  . Malignant melanoma ([place]) 11/23/2016  . Urge incontinence 11/23/2016  . Atypical mole 09/22/2016  . Slurring of speech 05/12/2016  . Traumatic arthritis elbow 05/04/2015  . Left foot pain 12/31/2014  . Severe sprain of left ankle 12/31/2014  . Osteopenia 09/15/2014   . History of TIA (transient ischemic attack) 05/19/2014  . Fracture of coronoid process of ulna, left, closed 04/25/2014  . Ingrown toenail 01/15/2014  . Obesity (BMI 30-39.9) 10/08/2013  . OA (osteoarthritis) of knee 07/07/2013  . Chronic low back pain 05/09/2013  . Obstructive sleep apnea 05/09/2013  . GERD (gastroesophageal reflux disease) 05/09/2013  . Hiatal hernia 05/09/2013  . Fibromyalgia syndrome 05/09/2013  . Depression, major, in partial remission ([place]) 05/09/2013  . RLS (restless legs syndrome) 05/09/2013  . Diabetes mellitus with no complication ([place]) 00/45/9977  . High cholesterol 05/09/2013  . 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 Family history of colon cancer 05/09/2013  . Allergic rhinitis 05/09/2013  . Lateral meniscal tear 09/04/2012  . CAD (coronary artery disease) 09/27/2011  . HTN (hypertension) 09/27/2011  . Paroxysmal A-fib ([place]) 09/27/2011     Phillips Grout PT, DPT, GCS  Eathon Valade 08/14/2019, 9:51 AM  [place] MAIN [facility] SERVICES [address] [place], [place], [address] Phone: [phone]   Fax:  [phone]  Name: Morgan [name] MRN: [id number] Date of Birth: [birth year]

## 2019-08-19 ENCOUNTER — Other Ambulatory Visit: Payer: Self-pay

## 2019-08-19 ENCOUNTER — Ambulatory Visit: Payer: Medicare Other

## 2019-08-19 DIAGNOSIS — M6281 Muscle weakness (generalized): Secondary | ICD-10-CM

## 2019-08-19 DIAGNOSIS — M5412 Radiculopathy, cervical region: Secondary | ICD-10-CM

## 2019-08-19 DIAGNOSIS — M542 Cervicalgia: Secondary | ICD-10-CM | POA: Diagnosis not present

## 2019-08-19 NOTE — Therapy (Signed)
[place] MAIN [facility] SERVICES [address] [place], [place], [address] Phone: [phone]   Fax:  [phone]  Physical Therapy Treatment  Patient Details  Name: Morgan [name] MRN: [id number] Date of Birth: [birth year] Referring Provider (PT): Dr. Mayer Camel   Encounter Date: 08/19/2019  PT End of Session - 08/19/19 1514    Visit Number  54    Number of Visits  30    Date for PT Re-Evaluation  09/30/19    Authorization Type  goals updated 08/05/19    PT Start Time  1510    PT Stop Time  1555    PT Time Calculation (min)  45 min    Equipment Utilized During Treatment  Gait belt    Activity Tolerance  Patient tolerated treatment well    Behavior During Therapy  [facility] for tasks assessed/performed       Past Medical History:  Diagnosis Date  . Angina   . Arthritis   . Atrial fibrillation (HCC)    ASPIRIN FOR BLOOD THINNER  . Atypical mole 09/22/2016  . Bulging discs    lumbar   . Cancer ([place])    melanoma  . Complication of anesthesia    pt states has choking sensation with ET tube   . Coronary artery disease   . Depression   . Diabetes mellitus    diet controlled/on meds  . Family history of breast cancer   . Family history of colon cancer   . Fibromyalgia   . GERD (gastroesophageal reflux disease)   . H/O hiatal hernia   . Headache(784.0)    "recurring"  . High cholesterol   . History of colon polyps   . Hyperlipidemia   . Hypertension   . 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 Migraines    "til ~ 1980"  . PONV (postoperative nausea and vomiting)   . Restless leg syndrome   . Sciatic nerve pain    "from pinched nerve"  . Sleep apnea    uses CPAP  . Stroke [facility]) 2014   no deficits  . Weakness of right side of body    "I've had PT for it; they don't know what it's from".  CORTISONE INJECTION INTO BACK 08/30/12    Past Surgical History:  Procedure Laterality Date  . [place] STUDY N/A 12/29/2015   Procedure: [place] STUDY;   Surgeon: Manus Gunning, MD;  Location: [facility];  Service: Gastroenterology;  Laterality: N/A;  . CARPAL TUNNEL RELEASE Left 07/02/2015   Procedure: CARPAL TUNNEL RELEASE;  Surgeon: Frederik Pear, MD;  Location: [place];  Service: Orthopedics;  Laterality: Left;  . CATARACT EXTRACTION, BILATERAL Bilateral    Oct and Nov 2017  . CORONARY ANGIOPLASTY WITH STENT PLACEMENT  06/2011   "1"  . CORONARY ARTERY BYPASS GRAFT  2005   CABG X 2  . DILATION AND CURETTAGE OF UTERUS     "more than once"  . ELBOW ARTHROSCOPY Left 07/02/2015   Procedure: ARTHROSCOPY LEFT ELBOW WITH DEBRIDEMENT AND REMOVAL LOOSE BODY;  Surgeon: Frederik Pear, MD;  Location: [place];  Service: Orthopedics;  Laterality: Left;  . ESOPHAGEAL MANOMETRY N/A 12/29/2015   Procedure: ESOPHAGEAL MANOMETRY (EM);  Surgeon: Manus Gunning, MD;  Location: [facility];  Service: Gastroenterology;  Laterality: N/A;  . ESOPHAGEAL MANOMETRY N/A 07/30/2019   Procedure: ESOPHAGEAL MANOMETRY (EM);  Surgeon: Yetta Flock, MD;  Location: [facility];  Service: Gastroenterology;  Laterality: N/A;  .  FRACTURE SURGERY  ~ 2005   nose  . KNEE ARTHROSCOPY  09/04/2012   Procedure: ARTHROSCOPY KNEE;  Surgeon: Gearlean Alf, MD;  Location: [facility];  Service: Orthopedics;  Laterality: Right;  right knee arthroscopy with medial and lateral meniscus debridement  . 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 MOUTH SURGERY  2004   "bone replacement; had cadavear bones put in; face was collapsing"  . NASAL SEPTUM SURGERY  ~ 1986  . TOTAL KNEE ARTHROPLASTY Right 07/07/2013   Procedure: RIGHT TOTAL KNEE ARTHROPLASTY;  Surgeon: Gearlean Alf, MD;  Location: [facility];  Service: Orthopedics;  Laterality: Right;    There were no vitals filed for this visit.  Subjective Assessment - 08/19/19 1507    Subjective  Pt reports that she is doing well today.  Denies any pain or soreness upon  arrival today. She reports that her numbness/tingling is improving with respect to frequency however the duration of the episodes are about the same. No specific questions or concerns at this time.    Pertinent History  On 08/03/18 pt was carrying a folding table and fell and right arm was under the handle got stuck between the door and the table. She landed on her R side. Tried to get up and had to pull at her arm. Pt had immediate pain in her right shoulder. She waited until Monday and went to see Dr. Edilia Bo her PCP. He took radiographs but didn't find any fractures. She continued to have pain so she called Dr. Damita Dunnings office, her orthopedist, and saw him that same week. He took additional radiographs and found a hairline fracture in her humeral shaft and a greater tuberosity fracture per patient report. She states that she is not healing well and has recurrent plain film radiographs. She is still wearing a sling and was encouraged not to perform any AROM of her right shoulder. Since her last visit with Dr. Mayer Camel on 09/20/18 she has had an additional fall and landed on her R shoulder. She reports additional bruising and pain in her R shoulder. She has a follow-up appt scheduled with her orthopedist for 10/15/18. 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 ROS negative for red flags. 06/05/19: Pt presents to physical therapy with reports of intermittent RUE numbness and tingling that began about 4 weeks ago. The numbness and tingling begins at the shoulder and extends down into the elbow. On days when it is more intense, it begins in the cervical spine. After about 5 seconds, numbness tingling subsides, and she experiences min hyperalgesia. Pt was told by physician that if numbness and tingling does not improve with conservative therapy after 3 months an MRI will be ordered. She is currently being seen in physical therapy for R shoulder pain due to recovery post greater tubercle fracture.     Limitations  Lifting    Diagnostic tests  Plain film radiographs with decreased healing per pt report    Patient Stated Goals  Improve ability to use RUE and decrease pain    Currently in Pain?  No/denies           TREATMENT   Manual Therapy UBE 17mn forward/251m backward for warm-up during history; R upper trap stretch 30s hold; C2-6 CPA, grade I-II, 30s/bout x 2 bouts/level; R C4-6 unilateral opening mobilizations, grade II-III, 30s/bout x 1 bout/level; R median nerve glides x 10; R 1st rib mobilizations, grade III, 30s/bout x  2 bouts;   Mechanical Cervical Traction Intermittent, 30 degree pull angle,32# 30s hold time, 17# 10s rest time x 14 minutes total. Pt denies any numbness/tingling at end of traction.   Trigger Point Dry Needling (TDN), unbilled Education performed with patient regarding potential benefit of TDN. Pt provided verbal consent to treatment. TDN performed with 4, 0.30 x 50 single needle placements to R upper trap (2), C3 multifidi (1), and C5 multifidi (1) with local twitch response (LTR) during all placements. Pistoning technique utilized.     Pt educated throughout session about proper posture and technique with exercises. Improved exercise technique, movement at target joints, use of target muscles after min to mod verbal cues.   Performed TDN again today with twitch response noted. Median nerve glides demonstrates gradual increase in ROM with repetition. Continued with additional manual techniques. No RUE N/T reported at end of session.Pt will benefit from PT services to address deficits in strength, mobility, and pain in order to return to full function at home.                          PT Short Term Goals - 08/05/19 1509      PT SHORT TERM GOAL #1   Title  Pt will be independent with HEP in order to improve strength and decrease pain in order to improve pain-free function at home.    Time  4    Period   Weeks    Status  Achieved        PT Long Term Goals - 08/05/19 1318      PT LONG TERM GOAL #1   Title  Pt will decrease quick DASH score to below 30% in order to demonstrate clinically significant reduction in disability.    Baseline  09/30/18: 84.1%; 10/30/18: 56.8%; 11/27/18: 45.45%; 12/23/18: 40.9%; 01/21/19: 29.5%, 6/4: 43%; 05/22/19: 43.2%; 08/05/19: 25%    Time  8    Period  Weeks    Status  Achieved      PT LONG TERM GOAL #2   Title  Pt will decrease worst shoulder pain as reported on NPRS to below 4/10 in order to demonstrate clinically significant reduction in pain.    Baseline  09/30/18: worst: 9/10; 10/30/18: 6/10; 11/27/18: 4/10; 12/23/18: 5/10 (pain is less frequent); 01/21/19: 5/10 (pain is less frequent); 03/19/19: 4/10; 6/4: 5/10; 05/22/19: 4/10 (decreased frequency of pain as well); 08/05/19: 2/10    Time  8    Period  Weeks    Status  Achieved      PT LONG TERM GOAL #3   Title  Pt will improve PROM of R shoulder to within 10 degrees of R shoulder in order to improve functional use of R shoulder for ADLs/IADLs.    Baseline  R/L 129/150 Shoulder flexion, 100/165 Shoulder abduction, 50/93 Shoulder external rotation, 50/70 Shoulder internal rotation; 10/30/18: R/L 140/150 Shoulder flexion, 132/165 Shoulder abduction, 82/93 Shoulder external rotation, 70/70 Shoulder internal rotation; 12/23/18:  R/L 155/150 Shoulder flexion, 134/165 Shoulder abduction, 89/93 Shoulder external rotation, 70/70 Shoulder internal rotation, 01/21/19: R/L 154/150 Shoulder flexion, 136/165 Shoulder abduction, 85/93 Shoulder external rotation, 70/70 Shoulder internal rotation; 03/19/19: Unable to test over telemedicine 6/4: R: 128 flexion, 121 abduction, 78 shoulder ER, 70 shoulder IR; 05/22/19: R: 148 flexion (154 L side), 145 abduction (166 L side), 85 shoulder ER, 76 shoulder IR; 08/05/19: R flexion 150 deg (L 162) R abduction 165 (L 170); R ER 89; R IR 90  Time  8    Period  Weeks    Status  Partially Met    Target  Date  09/30/19      PT LONG TERM GOAL #4   Title  Pt will improve pain-free R shoulder strength for flexion and abduction so that it is symmetrical with L shoulder in order to return to full function at home    Baseline  12/23/18: R/L 4-/4+ Shoulder flexion, 4/4+ Shoulder abduction, both painful on R side; 01/21/19: R/L 4/4+ Shoulder flexion, 4/4+ Shoulder abduction, both painful on R side; 03/19/19: Unable to test over telemedicine 6/4: 4-/4; 05/22/19: R/L: Flexion: 4/4+ (no pain bilateral) Abduction: 4-/4 (painful on R side, no pain on L side); 08/05/19: R/L flexion 4/5 (some pain on R), R/L abduction 4+/5 but no pain    Time  8    Period  Weeks    Status  Partially Met    Target Date  09/30/19      PT LONG TERM GOAL #5   Title  Pt will demonstrate decrease in NDI by at least 19% in order to demonstrate clinically significant reduction in disability related to neck injury/pain    Baseline  06/05/19: 36%; 08/05/19: 8%    Time  8    Period  Weeks    Status  Achieved            Plan - 08/19/19 1515    Clinical Impression Statement  Performed TDN again today with twitch response noted. Median nerve glides demonstrate gradual increase in ROM with repetition. Continued with additional manual techniques. No RUE N/T reported at end of session. Pt will benefit from PT services to address deficits in strength, mobility, and pain in order to return to full function at home.    Personal Factors and Comorbidities  Age;Comorbidity 2;Past/Current Experience    Comorbidities  afib, CAD, fibromyalgia, DM, depression    Examination-Activity Limitations  Bathing;Carry    Examination-Participation Restrictions  Cleaning;Community Activity    Stability/Clinical Decision Making  Unstable/Unpredictable    Rehab Potential  Good    PT Frequency  2x / week    PT Duration  8 weeks    PT Treatment/Interventions  ADLs/Self Care Home Management;Aquatic Therapy;Canalith Repostioning;Cryotherapy;Electrical  Stimulation;Iontophoresis 21m/ml Dexamethasone;Moist Heat;Traction;Ultrasound;Contrast Bath;DME Instruction;Gait training;Stair training;Functional mobility training;Therapeutic activities;Therapeutic exercise;Balance training;Neuromuscular re-education;Patient/family education;Manual techniques;Passive range of motion;Dry needling;Splinting;Taping;Vestibular;Spinal Manipulations    PT Next Visit Plan  cervical retractions, deep neck flexor exercises, nerve glides, cervical traction, cervical mobilizations    PT Home Exercise Plan  Cervical retractions, Scapular retractions, R upper trap stretch, supine canes for AAROM flexion with hands together progressing to AROM supine flexion, AROM sidelying shoulder abduction, AROM sidelying shoulder ER, seated pulleys for flexion and scaption ([id number])    Consulted and Agree with Plan of Care  Patient       Patient will benefit from skilled therapeutic intervention in order to improve the following deficits and impairments:  Decreased strength, Decreased range of motion, Impaired UE functional use, Pain  Visit Diagnosis: Cervicalgia  Radiculopathy, cervical region  Muscle weakness (generalized)     Problem List Patient Active Problem List   Diagnosis Date Noted  . Dysphagia   . Globus sensation   . Family history of breast cancer   . History of colon polyps   . Chronic diarrhea 01/14/2019  . Malignant melanoma ([place] 11/23/2016  . Urge incontinence 11/23/2016  . Atypical mole 09/22/2016  . Slurring of speech 05/12/2016  . Traumatic arthritis elbow 05/04/2015  .  Left foot pain 12/31/2014  . Severe sprain of left ankle 12/31/2014  . Osteopenia 09/15/2014  . History of TIA (transient ischemic attack) 05/19/2014  . Fracture of coronoid process of ulna, left, closed 04/25/2014  . Ingrown toenail 01/15/2014  . Obesity (BMI 30-39.9) 10/08/2013  . OA (osteoarthritis) of knee 07/07/2013  . Chronic low back pain 05/09/2013  . Obstructive sleep  apnea 05/09/2013  . GERD (gastroesophageal reflux disease) 05/09/2013  . Hiatal hernia 05/09/2013  . Fibromyalgia syndrome 05/09/2013  . Depression, major, in partial remission ([place]) 05/09/2013  . RLS (restless legs syndrome) 05/09/2013  . Diabetes mellitus with no complication ([place]) 12/24/1733  . High cholesterol 05/09/2013  . 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 Family history of colon cancer 05/09/2013  . Allergic rhinitis 05/09/2013  . Lateral meniscal tear 09/04/2012  . CAD (coronary artery disease) 09/27/2011  . HTN (hypertension) 09/27/2011  . Paroxysmal A-fib ([place]) 09/27/2011   Phillips Grout PT, DPT, GCS  , 08/19/2019, 3:29 PM  [place] MAIN [facility] SERVICES [address] [place], [place], [address] Phone: [phone]   Fax:  [phone]  Name: Morgan [name] MRN: [id number] Date of Birth: Mar 19, 1941

## 2019-08-21 ENCOUNTER — Other Ambulatory Visit: Payer: Self-pay

## 2019-08-21 ENCOUNTER — Ambulatory Visit: Payer: Medicare Other

## 2019-08-21 DIAGNOSIS — M542 Cervicalgia: Secondary | ICD-10-CM | POA: Diagnosis not present

## 2019-08-21 DIAGNOSIS — M5412 Radiculopathy, cervical region: Secondary | ICD-10-CM

## 2019-08-21 DIAGNOSIS — M25611 Stiffness of right shoulder, not elsewhere classified: Secondary | ICD-10-CM

## 2019-08-21 DIAGNOSIS — M25511 Pain in right shoulder: Secondary | ICD-10-CM

## 2019-08-21 DIAGNOSIS — M6281 Muscle weakness (generalized): Secondary | ICD-10-CM

## 2019-08-21 NOTE — Therapy (Signed)
[place] MAIN [facility] SERVICES [address] [place], [place], [address] Phone: [phone]   Fax:  [phone]  Physical Therapy Treatment  Patient Details  Name: Morgan [name] MRN: [id number] Date of Birth: January 19, 1941 Referring Provider (PT): Dr. Mayer Camel   Encounter Date: 08/21/2019  PT End of Session - 08/21/19 1041    Visit Number  55    Number of Visits  55    Date for PT Re-Evaluation  09/30/19    Authorization Type  goals updated 08/05/19    PT Start Time  1036    PT Stop Time  1115    PT Time Calculation (min)  39 min    Activity Tolerance  Patient tolerated treatment well    Behavior During Therapy  [facility] for tasks assessed/performed       Past Medical History:  Diagnosis Date  . Angina   . Arthritis   . Atrial fibrillation (HCC)    ASPIRIN FOR BLOOD THINNER  . Atypical mole 09/22/2016  . Bulging discs    lumbar   . Cancer ([place])    melanoma  . Complication of anesthesia    pt states has choking sensation with ET tube   . Coronary artery disease   . Depression   . Diabetes mellitus    diet controlled/on meds  . Family history of breast cancer   . Family history of colon cancer   . Fibromyalgia   . GERD (gastroesophageal reflux disease)   . H/O hiatal hernia   . Headache(784.0)    "recurring"  . High cholesterol   . History of colon polyps   . Hyperlipidemia   . Hypertension   . 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 Migraines    "til ~ 1980"  . PONV (postoperative nausea and vomiting)   . Restless leg syndrome   . Sciatic nerve pain    "from pinched nerve"  . Sleep apnea    uses CPAP  . Stroke [facility]) 2014   no deficits  . Weakness of right side of body    "I've had PT for it; they don't know what it's from".  CORTISONE INJECTION INTO BACK 08/30/12    Past Surgical History:  Procedure Laterality Date  . [place] STUDY N/A 12/29/2015   Procedure: [place] STUDY;  Surgeon: Manus Gunning, MD;  Location: [facility]  ENDOSCOPY;  Service: Gastroenterology;  Laterality: N/A;  . CARPAL TUNNEL RELEASE Left 07/02/2015   Procedure: CARPAL TUNNEL RELEASE;  Surgeon: Frederik Pear, MD;  Location: [place];  Service: Orthopedics;  Laterality: Left;  . CATARACT EXTRACTION, BILATERAL Bilateral    Oct and Nov 2017  . CORONARY ANGIOPLASTY WITH STENT PLACEMENT  06/2011   "1"  . CORONARY ARTERY BYPASS GRAFT  2005   CABG X 2  . DILATION AND CURETTAGE OF UTERUS     "more than once"  . ELBOW ARTHROSCOPY Left 07/02/2015   Procedure: ARTHROSCOPY LEFT ELBOW WITH DEBRIDEMENT AND REMOVAL LOOSE BODY;  Surgeon: Frederik Pear, MD;  Location: [place];  Service: Orthopedics;  Laterality: Left;  . ESOPHAGEAL MANOMETRY N/A 12/29/2015   Procedure: ESOPHAGEAL MANOMETRY (EM);  Surgeon: Manus Gunning, MD;  [facility]: [facility];  Service: Gastroenterology;  Laterality: N/A;  . ESOPHAGEAL MANOMETRY N/A 07/30/2019   Procedure: ESOPHAGEAL MANOMETRY (EM);  Surgeon: Yetta Flock, MD;  Location: [facility];  Service: Gastroenterology;  Laterality: N/A;  . FRACTURE SURGERY  ~ 2005   nose  .  KNEE ARTHROSCOPY  09/04/2012   Procedure: ARTHROSCOPY KNEE;  Surgeon: Gearlean Alf, MD;  Location: [facility] ORS;  Service: Orthopedics;  Laterality: Right;  right knee arthroscopy with medial and lateral meniscus debridement  . 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 MOUTH SURGERY  2004   "bone replacement; had cadavear bones put in; face was collapsing"  . NASAL SEPTUM SURGERY  ~ 1986  . TOTAL KNEE ARTHROPLASTY Right 07/07/2013   Procedure: RIGHT TOTAL KNEE ARTHROPLASTY;  Surgeon: Gearlean Alf, MD;  [facility] OR[facility];  Service: Orthopedics;  Laterality: Right;    There were no vitals filed for this visit.  Subjective Assessment - 08/21/19 1038    Subjective  Patient reported that she is doing well today, no pain at start of session initally but then states her pain is like 2/10. She did  have some soreness after therapy and yesterday. She also reported a headache right after that last a few days as well. Still mild complaints of headache.    Pertinent History  On 08/03/18 pt was carrying a folding table and fell and right arm was under the handle got stuck between the door and the table. She landed on her R side. Tried to get up and had to pull at her arm. Pt had immediate pain in her right shoulder. She waited until Monday and went to see Dr. Edilia Bo her PCP. He took radiographs but didn't find any fractures. She continued to have pain so she called Dr. Damita Dunnings office, her orthopedist, and saw him that same week. He took additional radiographs and found a hairline fracture in her humeral shaft and a greater tuberosity fracture per patient report. She states that she is not healing well and has recurrent plain film radiographs. She is still wearing a sling and was encouraged not to perform any AROM of her right shoulder. Since her last visit with Dr. Mayer Camel on 09/20/18 she has had an additional fall and landed on her R shoulder. She reports additional bruising and pain in her R shoulder. She has a follow-up appt scheduled with her orthopedist for 10/15/18. 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 ROS negative for red flags. 06/05/19: Pt presents to physical therapy with reports of intermittent RUE numbness and tingling that began about 4 weeks ago. The numbness and tingling begins at the shoulder and extends down into the elbow. On days when it is more intense, it begins in the cervical spine. After about 5 seconds, numbness tingling subsides, and she experiences min hyperalgesia. Pt was told by physician that if numbness and tingling does not improve with conservative therapy after 3 months an MRI will be ordered. She is currently being seen in physical therapy for R shoulder pain due to recovery post greater tubercle fracture.    Limitations  Lifting    Diagnostic tests   Plain film radiographs with decreased healing per pt report    Patient Stated Goals  Improve ability to use RUE and decrease pain    Currently in Pain?  Yes    Pain Score  2     Pain Location  Shoulder    Pain Orientation  Right    Pain Descriptors / Indicators  Sore    Pain Type  Chronic pain    Pain Onset  More than a month ago         TREATMENT   Manual Therapy    UBE  2 min forward/2 min backward for warm-up during history;   R upper trap STM/gentle trigger point release, mild STM to cervical paraspinals  C2-T3 CPA, grade I-II, 30s/bout x 2 bouts/level;   R 1st rib mobilizations, grade III, 30s/bout x 2 bouts;   Mechanical Cervical Traction     Intermittent, 30 degree pull angle, 32# 30s hold time, 17# 10s rest time x 12 minutes total. Pt with some complaints of R arm numbness during traction, decreased by the end of the traction but still present.  Performed median nerve glides x10 reps with changes in symptoms   Pt educated throughout session about proper posture and technique with exercises. Improved exercise technique, movement at target joints, use of target muscles after min to mod verbal cues.      Pt response/clinical impression: The patient continued to report tenderness of cervical paraspinals and bilateral UT with STM today. The patient stated she also has intermittent numbness/tingling that occurs, but overall thinks the frequency is decreasing with physical therapy. The patient would benefit from further skilled PT intervention to continue to address limitations to improve pt QOL and functional abilities.     PT Education - 08/21/19 1121    Education Details  exercise technique/form, benefit of manual therapy    Person(s) Educated  Patient    Methods  Explanation;Demonstration;Tactile cues;Verbal cues    Comprehension  Verbalized understanding;Returned demonstration;Need further instruction;Verbal cues required       PT Short Term Goals - 08/05/19 1509       PT SHORT TERM GOAL #1   Title  Pt will be independent with HEP in order to improve strength and decrease pain in order to improve pain-free function at home.    Time  4    Period  Weeks    Status  Achieved        PT Long Term Goals - 08/05/19 1318      PT LONG TERM GOAL #1   Title  Pt will decrease quick DASH score to below 30% in order to demonstrate clinically significant reduction in disability.    Baseline  09/30/18: 84.1%; 10/30/18: 56.8%; 11/27/18: 45.45%; 12/23/18: 40.9%; 01/21/19: 29.5%, 6/4: 43%; 05/22/19: 43.2%; 08/05/19: 25%    Time  8    Period  Weeks    Status  Achieved      PT LONG TERM GOAL #2   Title  Pt will decrease worst shoulder pain as reported on NPRS to below 4/10 in order to demonstrate clinically significant reduction in pain.    Baseline  09/30/18: worst: 9/10; 10/30/18: 6/10; 11/27/18: 4/10; 12/23/18: 5/10 (pain is less frequent); 01/21/19: 5/10 (pain is less frequent); 03/19/19: 4/10; 6/4: 5/10; 05/22/19: 4/10 (decreased frequency of pain as well); 08/05/19: 2/10    Time  8    Period  Weeks    Status  Achieved      PT LONG TERM GOAL #3   Title  Pt will improve PROM of R shoulder to within 10 degrees of R shoulder in order to improve functional use of R shoulder for ADLs/IADLs.    Baseline  R/L 129/150 Shoulder flexion, 100/165 Shoulder abduction, 50/93 Shoulder external rotation, 50/70 Shoulder internal rotation; 10/30/18: R/L 140/150 Shoulder flexion, 132/165 Shoulder abduction, 82/93 Shoulder external rotation, 70/70 Shoulder internal rotation; 12/23/18:  R/L 155/150 Shoulder flexion, 134/165 Shoulder abduction, 89/93 Shoulder external rotation, 70/70 Shoulder internal rotation, 01/21/19: R/L 154/150 Shoulder flexion, 136/165 Shoulder abduction, 85/93 Shoulder external rotation, 70/70 Shoulder internal rotation; 03/19/19: Unable to test  over telemedicine 6/4: R: 128 flexion, 121 abduction, 78 shoulder ER, 70 shoulder IR; 05/22/19: R: 148 flexion (154 L side), 145 abduction  (166 L side), 85 shoulder ER, 76 shoulder IR; 08/05/19: R flexion 150 deg (L 162) R abduction 165 (L 170); R ER 89; R IR 90    Time  8    Period  Weeks    Status  Partially Met    Target Date  09/30/19      PT LONG TERM GOAL #4   Title  Pt will improve pain-free R shoulder strength for flexion and abduction so that it is symmetrical with L shoulder in order to return to full function at home    Baseline  12/23/18: R/L 4-/4+ Shoulder flexion, 4/4+ Shoulder abduction, both painful on R side; 01/21/19: R/L 4/4+ Shoulder flexion, 4/4+ Shoulder abduction, both painful on R side; 03/19/19: Unable to test over telemedicine 6/4: 4-/4; 05/22/19: R/L: Flexion: 4/4+ (no pain bilateral) Abduction: 4-/4 (painful on R side, no pain on L side); 08/05/19: R/L flexion 4/5 (some pain on R), R/L abduction 4+/5 but no pain    Time  8    Period  Weeks    Status  Partially Met    Target Date  09/30/19      PT LONG TERM GOAL #5   Title  Pt will demonstrate decrease in NDI by at least 19% in order to demonstrate clinically significant reduction in disability related to neck injury/pain    Baseline  06/05/19: 36%; 08/05/19: 8%    Time  8    Period  Weeks    Status  Achieved            Plan - 08/21/19 1041    Clinical Impression Statement  The patient continued to report tenderness of cervical paraspinals and bilateral UT with STM today. The patient stated she also has intermittent numbness/tingling that occurs, but overall thinks the frequency is decreasing with physical therapy. The patient would benefit from further skilled PT intervention to continue to address limitations to improve pt QOL and functional abilities.    Personal Factors and Comorbidities  Age;Comorbidity 2;Past/Current Experience    Comorbidities  afib, CAD, fibromyalgia, DM, depression    Examination-Activity Limitations  Bathing;Carry    Examination-Participation Restrictions  Cleaning;Community Activity    Rehab Potential  Good    PT Frequency   2x / week    PT Duration  8 weeks    PT Treatment/Interventions  ADLs/Self Care Home Management;Aquatic Therapy;Canalith Repostioning;Cryotherapy;Electrical Stimulation;Iontophoresis '4mg'$ /ml Dexamethasone;Moist Heat;Traction;Ultrasound;Contrast Bath;DME Instruction;Gait training;Stair training;Functional mobility training;Therapeutic activities;Therapeutic exercise;Balance training;Neuromuscular re-education;Patient/family education;Manual techniques;Passive range of motion;Dry needling;Splinting;Taping;Vestibular;Spinal Manipulations    PT Next Visit Plan  cervical retractions, deep neck flexor exercises, nerve glides, cervical traction, cervical mobilizations    PT Home Exercise Plan  Cervical retractions, Scapular retractions, R upper trap stretch, supine canes for AAROM flexion with hands together progressing to AROM supine flexion, AROM sidelying shoulder abduction, AROM sidelying shoulder ER, seated pulleys for flexion and scaption ([id number])       Patient will benefit from skilled therapeutic intervention in order to improve the following deficits and impairments:  Decreased strength, Decreased range of motion, Impaired UE functional use, Pain  Visit Diagnosis: Cervicalgia  Radiculopathy, cervical region  Muscle weakness (generalized)  Stiffness of right shoulder, not elsewhere classified  Acute pain of right shoulder     Problem List Patient Active Problem List   Diagnosis Date Noted  . Dysphagia   . Globus sensation   .  Family history of breast cancer   . History of colon polyps   . Chronic diarrhea 01/14/2019  . Malignant melanoma ([place]) 11/23/2016  . Urge incontinence 11/23/2016  . Atypical mole 09/22/2016  . Slurring of speech 05/12/2016  . Traumatic arthritis elbow 05/04/2015  . Left foot pain 12/31/2014  . Severe sprain of left ankle 12/31/2014  . Osteopenia 09/15/2014  . History of TIA (transient ischemic attack) 05/19/2014  . Fracture of coronoid process of  ulna, left, closed 04/25/2014  . Ingrown toenail 01/15/2014  . Obesity (BMI 30-39.9) 10/08/2013  . OA (osteoarthritis) of knee 07/07/2013  . Chronic low back pain 05/09/2013  . Obstructive sleep apnea 05/09/2013  . GERD (gastroesophageal reflux disease) 05/09/2013  . Hiatal hernia 05/09/2013  . Fibromyalgia syndrome 05/09/2013  . Depression, major, in partial remission ([place]) 05/09/2013  . RLS (restless legs syndrome) 05/09/2013  . Diabetes mellitus with no complication ([place]) 72/05/2181  . High cholesterol 05/09/2013  . 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 Family history of colon cancer 05/09/2013  . Allergic rhinitis 05/09/2013  . Lateral meniscal tear 09/04/2012  . CAD (coronary artery disease) 09/27/2011  . HTN (hypertension) 09/27/2011  . Paroxysmal A-fib [facility]) 09/27/2011    Lieutenant Diego PT, DPT 11:23 AM,08/21/19 [phone]  [place] MAIN [facility] SERVICES [address] [place], [place], [address] Phone: [phone]   Fax:  [phone]  Name: Orean Giarratano MRN: [id number] Date of Birth: 11-01-1941

## 2019-08-25 ENCOUNTER — Encounter: Payer: Self-pay | Admitting: Genetic Counselor

## 2019-08-25 ENCOUNTER — Telehealth: Payer: Self-pay | Admitting: Genetic Counselor

## 2019-08-25 ENCOUNTER — Ambulatory Visit: Payer: Self-pay | Admitting: Genetic Counselor

## 2019-08-25 DIAGNOSIS — Z1379 Encounter for other screening for genetic and chromosomal anomalies: Secondary | ICD-10-CM | POA: Insufficient documentation

## 2019-08-25 NOTE — Progress Notes (Signed)
HPI:  Morgan [name] was previously seen in the [place] clinic due to a personal history of colon polyps and a family history of  and concerns regarding a hereditary predisposition to cancer. Please refer to our prior cancer genetics clinic note for more information regarding our discussion, assessment and recommendations, at the time. Morgan [name] recent genetic test results were disclosed to her, as were recommendations warranted by these results. These results and recommendations are discussed in more detail below.  CANCER HISTORY:  Oncology History   No history exists.    FAMILY HISTORY:  We obtained a detailed, 4-generation family history.  Significant diagnoses are listed below: Family History  Problem Relation Age of Onset   Cancer Mother 75       breast cancer   Breast cancer Mother 74   Heart disease Father 82       sudden onset due to CAD   Cancer Sister 66       colon   Hypertension Sister    Dementia Sister    Diabetes Sister    Colon cancer Sister    GER disease Daughter    Hypertension Daughter    Heart disease Brother    Hypertension Brother    Heart attack Neg Hx    Stroke Neg Hx     Morgan [name] has a daughter who is 7 and a son who is 22. She also has an adopted son who is not biologically related. She has one sister and two brothers. Her sister had colon cancer in her 21s, which was treated with a surgery that removed most of her colon. She believes that her brothers have had colon polyps as well, although not as many as her.  Morgan [name] mother died at the age of 59 and was diagnosed with breast cancer at the age of 49.  She had two maternal aunts and five maternal uncles who all died older than 13. None of these individuals or any of their children have had cancer. Morgan [name] maternal grandmother died in her late 71s, and her grandfather died at the age of 89.  Morgan [name]'s father died at the age of 19. She had three  paternal aunts and four paternal uncles who all died older than 71. None of these individuals or any of their children have had cancer. Morgan [name] paternal grandparents both died in their 43s.   Morgan [name] is unaware of previous family history of genetic testing for hereditary cancer risks. Patient's maternal ancestors are of European descent, and paternal ancestors are of [place] descent. There is no reported Ashkenazi Jewish ancestry. There is no known consanguinity.  GENETIC TEST RESULTS: Genetic testing reported out on 08/25/2019 through the [employer] [facility]oNext+RNAinsight cancer panel found no pathogenic variants. The [facility]oNext gene panel offered by [employer] and includes sequencing and rearrangement analysis for the following 20 genes: APC*, AXIN2, BMPR1A, CDH1*, CHEK2*, MLH1*, MSH2*, MSH3, MSH6*, MUTYH*, NTHL1, PMS2*, PTEN*, SMAD4, STK11 and TP53* (sequencing and deletion/duplication); POLD1 and POLE (sequencing only); EPCAM and GREM1 (deletion/duplication only). DNA and RNA analyses performed for * genes. The test report will be scanned into EPIC and located under the Molecular Pathology section of the Results Review tab.  A portion of the result report is included below for reference.    We discussed with Morgan [name] that because current genetic testing is not perfect, it is possible there may be a gene mutation in one of these genes that current testing cannot detect, but  that chance is small.  We also discussed, that there could be another gene that has not yet been discovered, or that we have not yet tested, that is responsible for the cancer diagnoses in the family. It is also possible there is a hereditary cause for the cancer in the family that Morgan [name] did not inherit and therefore was not identified in her testing.  Therefore, it is important to remain in touch with cancer genetics in the future so that we can continue to offer Morgan [name] the most up to date genetic testing.     ADDITIONAL GENETIC TESTING: We discussed with Morgan [name] that there are other genes that are associated with increased cancer risk that can be analyzed. Should Morgan [name] wish to pursue additional genetic testing, we are happy to discuss and coordinate this testing, at any time.   CANCER SCREENING RECOMMENDATIONS: Morgan [name] test result is considered negative (normal).  This means that we have not identified a hereditary cause for her personal history of colon polyps, or family history of cancer at this time. Most cancers happen by chance and this negative test suggests that her cancer may fall into this category.    While reassuring, this does not definitively rule out a hereditary predisposition to cancer. It is still possible that there could be genetic mutations that are undetectable by current technology. There could be genetic mutations in genes that have not been tested or identified to increase cancer risk.  Therefore, it is recommended she continue to follow the cancer management and screening guidelines provided by her primary healthcare provider.   An individual's cancer risk and medical management are not determined by genetic test results alone. Overall cancer risk assessment incorporates additional factors, including personal medical history, family history, and any available genetic information that may result in a personalized plan for cancer prevention and surveillance  This negative genetic test simply tells [place] that we cannot yet define why Morgan [name] has had an increased number of colorectal polyps.  Morgan [name] medical management and screening should be based on the prospect that she will likely form more colon polyps and should, therefore, undergo more frequent colonoscopy screening at intervals determined by her GI providers.  We also recommended that Morgan [name] have an upper endoscopy periodically.  Based on Morgan [name]'s family of cancer, as well as her genetic  test results, a statistical model Midwife) was used to estimate her risk of developing breast. This estimates her lifetime risk of developing breast cancer to be approximately 5.9%.  Her lifetime breast cancer risk is a preliminary estimate based on available information using one of several models endorsed by the [place] (ACS). The ACS recommends consideration of breast MRI screening as an adjunct to mammography for patients at high risk (defined as 20% or greater lifetime risk). A more detailed breast cancer risk assessment can be considered, if clinically indicated.    RECOMMENDATIONS FOR FAMILY MEMBERS:  Individuals in this family might be at some increased risk of developing cancer, over the general population risk, simply due to the family history of cancer.  We recommended women in this family have a yearly mammogram beginning at age 80, or 27 years younger than the earliest onset of cancer, an annual clinical breast exam, and perform monthly breast self-exams. Women in this family should also have a gynecological exam as recommended by their primary provider. All family members should have a colonoscopy by age 71.  FOLLOW-UP: Lastly, we discussed with  Ms. Tennant that cancer genetics is a rapidly advancing field and it is possible that new genetic tests will be appropriate for her and/or her family members in the future. We encouraged her to remain in contact with cancer genetics on an annual basis so we can update her personal and family histories and let her know of advances in cancer genetics that may benefit this family.   Our contact number was provided. Ms. Lodato questions were answered to her satisfaction, and she knows she is welcome to call us at anytime with additional questions or concerns.   Clint Guy, MS, [name] Certified Genetic Counselor [email or web address] Phone: [phone]

## 2019-08-25 NOTE — Telephone Encounter (Signed)
Revealed negative genetic testing.  Discussed that we do not know why she has had multiple colon polyps, or why there is cancer in the family.  It could be due to a different gene that we are not testing, or our current technology may not be able to detect something.  It will be important for her to keep in contact with genetics to keep up with whether additional testing may be needed.

## 2019-08-26 ENCOUNTER — Ambulatory Visit: Payer: Medicare Other

## 2019-08-26 ENCOUNTER — Other Ambulatory Visit: Payer: Self-pay | Admitting: Family Medicine

## 2019-08-26 ENCOUNTER — Other Ambulatory Visit: Payer: Self-pay

## 2019-08-26 DIAGNOSIS — M542 Cervicalgia: Secondary | ICD-10-CM

## 2019-08-26 DIAGNOSIS — M5412 Radiculopathy, cervical region: Secondary | ICD-10-CM

## 2019-08-26 DIAGNOSIS — M25611 Stiffness of right shoulder, not elsewhere classified: Secondary | ICD-10-CM

## 2019-08-26 NOTE — Therapy (Signed)
[place] MAIN [facility] SERVICES [address] [place], [place], [address] Phone: [phone]   Fax:  [phone]  Physical Therapy Treatment  Patient Details  Name: Morgan [name] MRN: [id number] Date of Birth: 16-May-1941 Referring Provider (PT): Dr. Mayer Camel   Encounter Date: 08/26/2019  PT End of Session - 08/26/19 1245    Visit Number  56    Number of Visits  81    Date for PT Re-Evaluation  09/30/19    Authorization Type  goals updated 08/05/19    PT Start Time  1042    PT Stop Time  1120    PT Time Calculation (min)  38 min    Activity Tolerance  Patient tolerated treatment well    Behavior During Therapy  [facility] for tasks assessed/performed       Past Medical History:  Diagnosis Date  . Angina   . Arthritis   . Atrial fibrillation (HCC)    ASPIRIN FOR BLOOD THINNER  . Atypical mole 09/22/2016  . Bulging discs    lumbar   . Cancer ([place])    melanoma  . Complication of anesthesia    pt states has choking sensation with ET tube   . Coronary artery disease   . Depression   . Diabetes mellitus    diet controlled/on meds  . Family history of breast cancer   . Family history of colon cancer   . Fibromyalgia   . GERD (gastroesophageal reflux disease)   . H/O hiatal hernia   . Headache(784.0)    "recurring"  . High cholesterol   . History of colon polyps   . Hyperlipidemia   . Hypertension   . 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 Migraines    "til ~ 1980"  . PONV (postoperative nausea and vomiting)   . Restless leg syndrome   . Sciatic nerve pain    "from pinched nerve"  . Sleep apnea    uses CPAP  . Stroke [facility]) 2014   no deficits  . Weakness of right side of body    "I've had PT for it; they don't know what it's from".  CORTISONE INJECTION INTO BACK 08/30/12    Past Surgical History:  Procedure Laterality Date  . [place] STUDY N/A 12/29/2015   Procedure: [place] STUDY;  Surgeon: Manus Gunning, MD;  Location: [facility];  Service: Gastroenterology;  Laterality: N/A;  . CARPAL TUNNEL RELEASE Left 07/02/2015   Procedure: CARPAL TUNNEL RELEASE;  Surgeon: Frederik Pear, MD;  Location: [place];  Service: Orthopedics;  Laterality: Left;  . CATARACT EXTRACTION, BILATERAL Bilateral    Oct and Nov 2017  . CORONARY ANGIOPLASTY WITH STENT PLACEMENT  06/2011   "1"  . CORONARY ARTERY BYPASS GRAFT  2005   CABG X 2  . DILATION AND CURETTAGE OF UTERUS     "more than once"  . ELBOW ARTHROSCOPY Left 07/02/2015   Procedure: ARTHROSCOPY LEFT ELBOW WITH DEBRIDEMENT AND REMOVAL LOOSE BODY;  Surgeon: Frederik Pear, MD;  Location: [place];  Service: Orthopedics;  Laterality: Left;  . ESOPHAGEAL MANOMETRY N/A 12/29/2015   Procedure: ESOPHAGEAL MANOMETRY (EM);  Surgeon: Manus Gunning, MD;  Location: [facility];  Service: Gastroenterology;  Laterality: N/A;  . ESOPHAGEAL MANOMETRY N/A 07/30/2019   Procedure: ESOPHAGEAL MANOMETRY (EM);  Surgeon: Yetta Flock, MD;  Location: [facility];  Service: Gastroenterology;  Laterality: N/A;  . FRACTURE SURGERY  ~ 2005   nose  .  KNEE ARTHROSCOPY  09/04/2012   Procedure: ARTHROSCOPY KNEE;  Surgeon: Gearlean Alf, MD;  Location: [facility];  Service: Orthopedics;  Laterality: Right;  right knee arthroscopy with medial and lateral meniscus debridement  . 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 MOUTH SURGERY  2004   "bone replacement; had cadavear bones put in; face was collapsing"  . NASAL SEPTUM SURGERY  ~ 1986  . TOTAL KNEE ARTHROPLASTY Right 07/07/2013   Procedure: RIGHT TOTAL KNEE ARTHROPLASTY;  Surgeon: Gearlean Alf, MD;  Location: [facility];  Service: Orthopedics;  Laterality: Right;    There were no vitals filed for this visit.  Subjective Assessment - 08/26/19 1055    Subjective  Patient reported that she is doing well today.  She report no pain today, but does state that her numbness and tingling has become  longer in duration and has been traveling down to her wrist.    Pertinent History  On 08/03/18 pt was carrying a folding table and fell and right arm was under the handle got stuck between the door and the table. She landed on her R side. Tried to get up and had to pull at her arm. Pt had immediate pain in her right shoulder. She waited until Monday and went to see Dr. Edilia Bo her PCP. He took radiographs but didn't find any fractures. She continued to have pain so she called Dr. Damita Dunnings office, her orthopedist, and saw him that same week. He took additional radiographs and found a hairline fracture in her humeral shaft and a greater tuberosity fracture per patient report. She states that she is not healing well and has recurrent plain film radiographs. She is still wearing a sling and was encouraged not to perform any AROM of her right shoulder. Since her last visit with Dr. Mayer Camel on 09/20/18 she has had an additional fall and landed on her R shoulder. She reports additional bruising and pain in her R shoulder. She has a follow-up appt scheduled with her orthopedist for 10/15/18. 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 ROS negative for red flags. 06/05/19: Pt presents to physical therapy with reports of intermittent RUE numbness and tingling that began about 4 weeks ago. The numbness and tingling begins at the shoulder and extends down into the elbow. On days when it is more intense, it begins in the cervical spine. After about 5 seconds, numbness tingling subsides, and she experiences min hyperalgesia. Pt was told by physician that if numbness and tingling does not improve with conservative therapy after 3 months an MRI will be ordered. She is currently being seen in physical therapy for R shoulder pain due to recovery post greater tubercle fracture.    Limitations  Lifting    Diagnostic tests  Plain film radiographs with decreased healing per pt report    Patient Stated Goals   Improve ability to use RUE and decrease pain    Currently in Pain?  No/denies    Pain Score  --    Pain Onset  --         TREATMENT   Manual Therapy UBE 40mn forward/243m backward for warm-up during history;  R pec stretch 30s hold x3   R latissimus dorsi stretch with pinning of the scapula 3x30sec  R median nerve glides x 12;  R ulnar nerve glides x12  R radial nerve glides x12  R 1st rib mobilizations, grade III, 30s/bout x 2  bouts;   Mechanical Cervical Traction Intermittent, 30 degree pull angle,32# 30s hold time, 17# 10s rest time x 24mnutes total.Pt denies any numbness/tingling at end of traction.   Pt educated throughout session about proper posture and technique with exercises. Improved exercise technique, movement at target joints, use of target muscles after min to mod verbal cues.   Pt states that her numbness and tingling has increased in duration and travels further down her arm today, so today's session was dedicated to extensive use of nerve glides and cervical traction, as well as stretching the pecs and lats in an effort to decrease radicular symptoms.  At the end of the session, pt reports decreased muscular tension and states that she feels better than she did before the session.  Pt will benefit from PT services to address deficits in strength, mobility, and pain in order to return to full function at home.         PT Short Term Goals - 08/05/19 1509      PT SHORT TERM GOAL #1   Title  Pt will be independent with HEP in order to improve strength and decrease pain in order to improve pain-free function at home.    Time  4    Period  Weeks    Status  Achieved        PT Long Term Goals - 08/05/19 1318      PT LONG TERM GOAL #1   Title  Pt will decrease quick DASH score to below 30% in order to demonstrate clinically significant reduction in disability.    Baseline  09/30/18: 84.1%; 10/30/18: 56.8%; 11/27/18: 45.45%;  12/23/18: 40.9%; 01/21/19: 29.5%, 6/4: 43%; 05/22/19: 43.2%; 08/05/19: 25%    Time  8    Period  Weeks    Status  Achieved      PT LONG TERM GOAL #2   Title  Pt will decrease worst shoulder pain as reported on NPRS to below 4/10 in order to demonstrate clinically significant reduction in pain.    Baseline  09/30/18: worst: 9/10; 10/30/18: 6/10; 11/27/18: 4/10; 12/23/18: 5/10 (pain is less frequent); 01/21/19: 5/10 (pain is less frequent); 03/19/19: 4/10; 6/4: 5/10; 05/22/19: 4/10 (decreased frequency of pain as well); 08/05/19: 2/10    Time  8    Period  Weeks    Status  Achieved      PT LONG TERM GOAL #3   Title  Pt will improve PROM of R shoulder to within 10 degrees of R shoulder in order to improve functional use of R shoulder for ADLs/IADLs.    Baseline  R/L 129/150 Shoulder flexion, 100/165 Shoulder abduction, 50/93 Shoulder external rotation, 50/70 Shoulder internal rotation; 10/30/18: R/L 140/150 Shoulder flexion, 132/165 Shoulder abduction, 82/93 Shoulder external rotation, 70/70 Shoulder internal rotation; 12/23/18:  R/L 155/150 Shoulder flexion, 134/165 Shoulder abduction, 89/93 Shoulder external rotation, 70/70 Shoulder internal rotation, 01/21/19: R/L 154/150 Shoulder flexion, 136/165 Shoulder abduction, 85/93 Shoulder external rotation, 70/70 Shoulder internal rotation; 03/19/19: Unable to test over telemedicine 6/4: R: 128 flexion, 121 abduction, 78 shoulder ER, 70 shoulder IR; 05/22/19: R: 148 flexion (154 L side), 145 abduction (166 L side), 85 shoulder ER, 76 shoulder IR; 08/05/19: R flexion 150 deg (L 162) R abduction 165 (L 170); R ER 89; R IR 90    Time  8    Period  Weeks    Status  Partially Met    Target Date  09/30/19      PT LONG TERM GOAL #4  Title  Pt will improve pain-free R shoulder strength for flexion and abduction so that it is symmetrical with L shoulder in order to return to full function at home    Baseline  12/23/18: R/L 4-/4+ Shoulder flexion, 4/4+ Shoulder abduction, both  painful on R side; 01/21/19: R/L 4/4+ Shoulder flexion, 4/4+ Shoulder abduction, both painful on R side; 03/19/19: Unable to test over telemedicine 6/4: 4-/4; 05/22/19: R/L: Flexion: 4/4+ (no pain bilateral) Abduction: 4-/4 (painful on R side, no pain on L side); 08/05/19: R/L flexion 4/5 (some pain on R), R/L abduction 4+/5 but no pain    Time  8    Period  Weeks    Status  Partially Met    Target Date  09/30/19      PT LONG TERM GOAL #5   Title  Pt will demonstrate decrease in NDI by at least 19% in order to demonstrate clinically significant reduction in disability related to neck injury/pain    Baseline  06/05/19: 36%; 08/05/19: 8%    Time  8    Period  Weeks    Status  Achieved            Plan - 08/26/19 1251    Clinical Impression Statement  Pt states that her numbness and tingling has increased in duration and travels further down her arm today, so today's session was dedicated to extensive use of nerve glides and cervical traction, as well as stretching the pecs and lats in an effort to decrease radicular symptoms.  At the end of the session, pt reports decreased muscular tension and states that she feels better than she did before the session.  Pt will benefit from PT services to address deficits in strength, mobility, and pain in order to return to full function at home.    Personal Factors and Comorbidities  Age;Comorbidity 2;Past/Current Experience    Comorbidities  afib, CAD, fibromyalgia, DM, depression    Examination-Activity Limitations  Bathing;Carry    Examination-Participation Restrictions  Cleaning;Community Activity    Rehab Potential  Good    PT Frequency  2x / week    PT Duration  8 weeks    PT Treatment/Interventions  ADLs/Self Care Home Management;Aquatic Therapy;Canalith Repostioning;Cryotherapy;Electrical Stimulation;Iontophoresis 7m/ml Dexamethasone;Moist Heat;Traction;Ultrasound;Contrast Bath;DME Instruction;Gait training;Stair training;Functional mobility  training;Therapeutic activities;Therapeutic exercise;Balance training;Neuromuscular re-education;Patient/family education;Manual techniques;Passive range of motion;Dry needling;Splinting;Taping;Vestibular;Spinal Manipulations    PT Next Visit Plan  cervical retractions, deep neck flexor exercises, nerve glides, cervical traction, cervical mobilizations    PT Home Exercise Plan  Cervical retractions, Scapular retractions, R upper trap stretch, supine canes for AAROM flexion with hands together progressing to AROM supine flexion, AROM sidelying shoulder abduction, AROM sidelying shoulder ER, seated pulleys for flexion and scaption ([id number])       Patient will benefit from skilled therapeutic intervention in order to improve the following deficits and impairments:  Decreased strength, Decreased range of motion, Impaired UE functional use, Pain  Visit Diagnosis: Cervicalgia  Radiculopathy, cervical region  Stiffness of right shoulder, not elsewhere classified     Problem List Patient Active Problem List   Diagnosis Date Noted  . Genetic testing 08/25/2019  . Dysphagia   . Globus sensation   . Family history of breast cancer   . History of colon polyps   . Chronic diarrhea 01/14/2019  . Malignant [place] 11/23/2016  . Urge incontinence 11/23/2016  . Atypical mole 09/22/2016  . Slurring of speech 05/12/2016  . Traumatic arthritis elbow 05/04/2015  . Left foot pain 12/31/2014  .  Severe sprain of left ankle 12/31/2014  . Osteopenia 09/15/2014  . History of TIA (transient ischemic attack) 05/19/2014  . Fracture of coronoid process of ulna, left, closed 04/25/2014  . Ingrown toenail 01/15/2014  . Obesity (BMI 30-39.9) 10/08/2013  . OA (osteoarthritis) of knee 07/07/2013  . Chronic low back pain 05/09/2013  . Obstructive sleep apnea 05/09/2013  . GERD (gastroesophageal reflux disease) 05/09/2013  . Hiatal hernia 05/09/2013  . Fibromyalgia syndrome 05/09/2013  . Depression,  major, in partial remission ([place]) 05/09/2013  . RLS (restless legs syndrome) 05/09/2013  . Diabetes mellitus with no complication ([place]) 44/36/0165  . High cholesterol 05/09/2013  . 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 Family history of colon cancer 05/09/2013  . Allergic rhinitis 05/09/2013  . Lateral meniscal tear 09/04/2012  . CAD (coronary artery disease) 09/27/2011  . HTN (hypertension) 09/27/2011  . Paroxysmal A-fib ([place]) 09/27/2011    This entire session was performed under direct supervision and direction of a licensed therapist/therapist assistant . I have personally read, edited and approve of the note as written.   Lutricia Horsfall, SPT [facility] Grout PT, DPT, GCS  Huprich,Jason 08/26/2019, 5:12 PM  [place] MAIN [facility] SERVICES [address] [place], [place], [address] Phone: [phone]   Fax:  [phone]  Name: Morgan [name] MRN: [id number] Date of Birth: September 29, 1941

## 2019-08-26 NOTE — Telephone Encounter (Signed)
Last office visit 02/25/2019 fir HTN & Diarrhea.  Last refilled 07/11/2019 for #60 with no refills.  CPE scheduled for 01/27/2020.

## 2019-08-28 ENCOUNTER — Ambulatory Visit: Payer: Medicare Other

## 2019-08-28 ENCOUNTER — Other Ambulatory Visit: Payer: Self-pay | Admitting: Family Medicine

## 2019-08-28 ENCOUNTER — Other Ambulatory Visit: Payer: Self-pay

## 2019-08-28 DIAGNOSIS — M542 Cervicalgia: Secondary | ICD-10-CM | POA: Diagnosis not present

## 2019-08-28 DIAGNOSIS — M25611 Stiffness of right shoulder, not elsewhere classified: Secondary | ICD-10-CM

## 2019-08-28 DIAGNOSIS — M5412 Radiculopathy, cervical region: Secondary | ICD-10-CM

## 2019-08-28 NOTE — Therapy (Signed)
[place] MAIN [place] [facility] SERVICES [address] [place], [place], [address] Phone: [phone]   Fax:  [phone]  Physical Therapy Treatment  Patient Details  Name: Morgan [name] MRN: [id number] Date of Birth: 1941-04-03 Referring Provider (PT): Dr. Mayer Camel   Encounter Date: 08/28/2019  PT End of Session - 08/28/19 1037    Visit Number  85    Number of Visits  61    Date for PT Re-Evaluation  09/30/19    Authorization Type  goals updated 08/05/19    PT Start Time  1037    PT Stop Time  1115    PT Time Calculation (min)  38 min    Activity Tolerance  Patient tolerated treatment well    Behavior During Therapy  [facility] for tasks assessed/performed       Past Medical History:  Diagnosis Date  . Angina   . Arthritis   . Atrial fibrillation (HCC)    ASPIRIN FOR BLOOD THINNER  . Atypical mole 09/22/2016  . Bulging discs    lumbar   . Cancer ([place])    melanoma  . Complication of anesthesia    pt states has choking sensation with ET tube   . Coronary artery disease   . Depression   . Diabetes mellitus    diet controlled/on meds  . Family history of breast cancer   . Family history of colon cancer   . Fibromyalgia   . GERD (gastroesophageal reflux disease)   . H/O hiatal hernia   . Headache(784.0)    "recurring"  . High cholesterol   . History of colon polyps   . Hyperlipidemia   . Hypertension   . 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 Migraines    "til ~ 1980"  . PONV (postoperative nausea and vomiting)   . Restless leg syndrome   . Sciatic nerve pain    "from pinched nerve"  . Sleep apnea    uses CPAP  . Stroke [facility]) 2014   no deficits  . Weakness of right side of body    "I've had PT for it; they don't know what it's from".  CORTISONE INJECTION INTO BACK 08/30/12    Past Surgical History:  Procedure Laterality Date  . [place] STUDY N/A 12/29/2015   Procedure: [place] STUDY;  Surgeon: Manus Gunning, MD;  Location: [facility]  ENDOSCOPY;  Service: Gastroenterology;  Laterality: N/A;  . CARPAL TUNNEL RELEASE Left 07/02/2015   Procedure: CARPAL TUNNEL RELEASE;  Surgeon: Frederik Pear, MD;  Location: [place];  Service: Orthopedics;  Laterality: Left;  . CATARACT EXTRACTION, BILATERAL Bilateral    Oct and Nov 2017  . CORONARY ANGIOPLASTY WITH STENT PLACEMENT  06/2011   "1"  . CORONARY ARTERY BYPASS GRAFT  2005   CABG X 2  . DILATION AND CURETTAGE OF UTERUS     "more than once"  . ELBOW ARTHROSCOPY Left 07/02/2015   Procedure: ARTHROSCOPY LEFT ELBOW WITH DEBRIDEMENT AND REMOVAL LOOSE BODY;  Surgeon: Frederik Pear, MD;  Location: [place];  Service: Orthopedics;  Laterality: Left;  . ESOPHAGEAL MANOMETRY N/A 12/29/2015   Procedure: ESOPHAGEAL MANOMETRY (EM);  Surgeon: Manus Gunning, MD;  Location: [facility];  Service: Gastroenterology;  Laterality: N/A;  . ESOPHAGEAL MANOMETRY N/A 07/30/2019   Procedure: ESOPHAGEAL MANOMETRY (EM);  Surgeon: Yetta Flock, MD;  Location: [facility];  Service: Gastroenterology;  Laterality: N/A;  . FRACTURE SURGERY  ~ 2005   nose  .  KNEE ARTHROSCOPY  09/04/2012   Procedure: ARTHROSCOPY KNEE;  Surgeon: Gearlean Alf, MD;  Location: [facility]S;  Service: Orthopedics;  Laterality: Right;  right knee arthroscopy with medial and lateral meniscus debridement  . 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 MOUTH SURGERY  2004   "bone replacement; had cadavear bones put in; face was collapsing"  . NASAL SEPTUM SURGERY  ~ 1986  . TOTAL KNEE ARTHROPLASTY Right 07/07/2013   Procedure: RIGHT TOTAL KNEE ARTHROPLASTY;  Surgeon: Gearlean Alf, MD;  Location: [facility];  Service: Orthopedics;  Laterality: Right;    There were no vitals filed for this visit.  Subjective Assessment - 08/28/19 1038    Subjective  Patient reported that she is doing well today.  She report no pain today.  She reports that her numbness/tingling was good for a  day, but has come back.  She reports that she feels the numbness and tingling most when she lifts her arm to put her arm on an armrest or washing her hair    Pertinent History  On 08/03/18 pt was carrying a folding table and fell and right arm was under the handle got stuck between the door and the table. She landed on her R side. Tried to get up and had to pull at her arm. Pt had immediate pain in her right shoulder. She waited until Monday and went to see Dr. Edilia Bo her PCP. He took radiographs but didn't find any fractures. She continued to have pain so she called Dr. Damita Dunnings office, her orthopedist, and saw him that same week. He took additional radiographs and found a hairline fracture in her humeral shaft and a greater tuberosity fracture per patient report. She states that she is not healing well and has recurrent plain film radiographs. She is still wearing a sling and was encouraged not to perform any AROM of her right shoulder. Since her last visit with Dr. Mayer Camel on 09/20/18 she has had an additional fall and landed on her R shoulder. She reports additional bruising and pain in her R shoulder. She has a follow-up appt scheduled with her orthopedist for 10/15/18. 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 ROS negative for red flags. 06/05/19: Pt presents to physical therapy with reports of intermittent RUE numbness and tingling that began about 4 weeks ago. The numbness and tingling begins at the shoulder and extends down into the elbow. On days when it is more intense, it begins in the cervical spine. After about 5 seconds, numbness tingling subsides, and she experiences min hyperalgesia. Pt was told by physician that if numbness and tingling does not improve with conservative therapy after 3 months an MRI will be ordered. She is currently being seen in physical therapy for R shoulder pain due to recovery post greater tubercle fracture.    Limitations  Lifting    Diagnostic  tests  Plain film radiographs with decreased healing per pt report    Patient Stated Goals  Improve ability to use RUE and decrease pain    Currently in Pain?  No/denies         TREATMENT   Manual Therapy UBE 13mn forward/258m backward for warm-up during history;  R pec stretch 3x30s   R upper trap stretch 3x30s  R ER shoulder stretch 3x30s   R IR shoulder stretch 3x30s  R latissimus dorsi stretch with pinning of the scapula 3x30sec  R median nerve glides  x 10;  R ulnar nerve glides x10  R radial nerve glides x10  R 1st rib mobilizations, grade III,30s/bout x 3 bouts;  STM of pec minor and upper trap with oscillating movements in an effort to release trigger points, tolerating light/mild pressure throughout   Pt educated throughout session about proper posture and technique with exercises. Improved exercise technique, movement at target joints, use of target muscles after min to mod verbal cues.   Pt's session was limited today due the pt requiring a bathroom break during our session.  She continues to report numbness and tingling in her R arm, especially with overhead or abducted positions.  She had relief for about a day after last session, but states that the symptoms are about the same today.  She is able to tolerate increased movements today with nerve glides, but is sensitive to touch with STM.  At the end of the session, pt reports that she felt less muscle tension, and states that she feels better than she did before the session.  Pt will benefit from PT services to address deficits in strength, mobility, and pain in order to return to full function at home.                          PT Short Term Goals - 08/05/19 1509      PT SHORT TERM GOAL #1   Title  Pt will be independent with HEP in order to improve strength and decrease pain in order to improve pain-free function at home.    Time  4    Period  Weeks    Status   Achieved        PT Long Term Goals - 08/05/19 1318      PT LONG TERM GOAL #1   Title  Pt will decrease quick DASH score to below 30% in order to demonstrate clinically significant reduction in disability.    Baseline  09/30/18: 84.1%; 10/30/18: 56.8%; 11/27/18: 45.45%; 12/23/18: 40.9%; 01/21/19: 29.5%, 6/4: 43%; 05/22/19: 43.2%; 08/05/19: 25%    Time  8    Period  Weeks    Status  Achieved      PT LONG TERM GOAL #2   Title  Pt will decrease worst shoulder pain as reported on NPRS to below 4/10 in order to demonstrate clinically significant reduction in pain.    Baseline  09/30/18: worst: 9/10; 10/30/18: 6/10; 11/27/18: 4/10; 12/23/18: 5/10 (pain is less frequent); 01/21/19: 5/10 (pain is less frequent); 03/19/19: 4/10; 6/4: 5/10; 05/22/19: 4/10 (decreased frequency of pain as well); 08/05/19: 2/10    Time  8    Period  Weeks    Status  Achieved      PT LONG TERM GOAL #3   Title  Pt will improve PROM of R shoulder to within 10 degrees of R shoulder in order to improve functional use of R shoulder for ADLs/IADLs.    Baseline  R/L 129/150 Shoulder flexion, 100/165 Shoulder abduction, 50/93 Shoulder external rotation, 50/70 Shoulder internal rotation; 10/30/18: R/L 140/150 Shoulder flexion, 132/165 Shoulder abduction, 82/93 Shoulder external rotation, 70/70 Shoulder internal rotation; 12/23/18:  R/L 155/150 Shoulder flexion, 134/165 Shoulder abduction, 89/93 Shoulder external rotation, 70/70 Shoulder internal rotation, 01/21/19: R/L 154/150 Shoulder flexion, 136/165 Shoulder abduction, 85/93 Shoulder external rotation, 70/70 Shoulder internal rotation; 03/19/19: Unable to test over telemedicine 6/4: R: 128 flexion, 121 abduction, 78 shoulder ER, 70 shoulder IR; 05/22/19: R: 148 flexion (154 L side), 145  abduction (166 L side), 85 shoulder ER, 76 shoulder IR; 08/05/19: R flexion 150 deg (L 162) R abduction 165 (L 170); R ER 89; R IR 90    Time  8    Period  Weeks    Status  Partially Met    Target Date  09/30/19       PT LONG TERM GOAL #4   Title  Pt will improve pain-free R shoulder strength for flexion and abduction so that it is symmetrical with L shoulder in order to return to full function at home    Baseline  12/23/18: R/L 4-/4+ Shoulder flexion, 4/4+ Shoulder abduction, both painful on R side; 01/21/19: R/L 4/4+ Shoulder flexion, 4/4+ Shoulder abduction, both painful on R side; 03/19/19: Unable to test over telemedicine 6/4: 4-/4; 05/22/19: R/L: Flexion: 4/4+ (no pain bilateral) Abduction: 4-/4 (painful on R side, no pain on L side); 08/05/19: R/L flexion 4/5 (some pain on R), R/L abduction 4+/5 but no pain    Time  8    Period  Weeks    Status  Partially Met    Target Date  09/30/19      PT LONG TERM GOAL #5   Title  Pt will demonstrate decrease in NDI by at least 19% in order to demonstrate clinically significant reduction in disability related to neck injury/pain    Baseline  06/05/19: 36%; 08/05/19: 8%    Time  8    Period  Weeks    Status  Achieved            Plan - 08/28/19 1221    Clinical Impression Statement  Pt's session was limited today due the pt requiring a bathroom break during our session.  She continues to report numbness and tingling in her R arm, especially with overhead or abducted positions.  She had relief for about a day after last session, but states that the symptoms are about the same today.  She is able to tolerate increased movements today with nerve glides, but is sensitive to touch with STM.  At the end of the session, pt reports that she felt less muscle tension, and states that she feels better than she did before the session.  Pt will benefit from PT services to address deficits in strength, mobility, and pain in order to return to full function at home.    Personal Factors and Comorbidities  Age;Comorbidity 2;Past/Current Experience    Comorbidities  afib, CAD, fibromyalgia, DM, depression    Examination-Activity Limitations  Bathing;Carry     Examination-Participation Restrictions  Cleaning;Community Activity    Rehab Potential  Good    PT Frequency  2x / week    PT Duration  8 weeks    PT Treatment/Interventions  ADLs/Self Care Home Management;Aquatic Therapy;Canalith Repostioning;Cryotherapy;Electrical Stimulation;Iontophoresis 67m/ml Dexamethasone;Moist Heat;Traction;Ultrasound;Contrast Bath;DME Instruction;Gait training;Stair training;Functional mobility training;Therapeutic activities;Therapeutic exercise;Balance training;Neuromuscular re-education;Patient/family education;Manual techniques;Passive range of motion;Dry needling;Splinting;Taping;Vestibular;Spinal Manipulations    PT Next Visit Plan  cervical retractions, deep neck flexor exercises, nerve glides, cervical traction, cervical mobilizations    PT Home Exercise Plan  Cervical retractions, Scapular retractions, R upper trap stretch, supine canes for AAROM flexion with hands together progressing to AROM supine flexion, AROM sidelying shoulder abduction, AROM sidelying shoulder ER, seated pulleys for flexion and scaption ([id number])       Patient will benefit from skilled therapeutic intervention in order to improve the following deficits and impairments:  Decreased strength, Decreased range of motion, Impaired UE functional use, Pain  Visit Diagnosis: Cervicalgia  Radiculopathy, cervical region  Stiffness of right shoulder, not elsewhere classified     Problem List Patient Active Problem List   Diagnosis Date Noted  . Genetic testing 08/25/2019  . Dysphagia   . Globus sensation   . Family history of breast cancer   . History of colon polyps   . Chronic diarrhea 01/14/2019  . Malignant melanoma ([place]) 11/23/2016  . Urge incontinence 11/23/2016  . Atypical mole 09/22/2016  . Slurring of speech 05/12/2016  . Traumatic arthritis elbow 05/04/2015  . Left foot pain 12/31/2014  . Severe sprain of left ankle 12/31/2014  . Osteopenia 09/15/2014  . History of TIA  (transient ischemic attack) 05/19/2014  . Fracture of coronoid process of ulna, left, closed 04/25/2014  . Ingrown toenail 01/15/2014  . Obesity (BMI 30-39.9) 10/08/2013  . OA (osteoarthritis) of knee 07/07/2013  . Chronic low back pain 05/09/2013  . Obstructive sleep apnea 05/09/2013  . GERD (gastroesophageal reflux disease) 05/09/2013  . Hiatal hernia 05/09/2013  . Fibromyalgia syndrome 05/09/2013  . Depression, major, in partial remission ([place]) 05/09/2013  . RLS (restless legs syndrome) 05/09/2013  . Diabetes mellitus with no complication ([place]) 28/31/5176  . High cholesterol 05/09/2013  . 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 Family history of colon cancer 05/09/2013  . Allergic rhinitis 05/09/2013  . Lateral meniscal tear 09/04/2012  . CAD (coronary artery disease) 09/27/2011  . HTN (hypertension) 09/27/2011  . Paroxysmal A-fib ([place]) 09/27/2011    This entire session was performed under direct supervision and direction of a licensed therapist/therapist assistant . I have personally read, edited and approve of the note as written.   Lutricia Horsfall, SPT [facility]out PT, DPT, GCS  Huprich,Jason 08/29/2019, 9:59 AM  [place] MAIN [facility] SERVICES [address] [place], [place], [address] Phone: [phone]   Fax:  [phone]  Name: Morgan [name] MRN: [id number] Date of Birth: Jun 10, 1941

## 2019-09-02 ENCOUNTER — Ambulatory Visit: Payer: Medicare Other

## 2019-09-02 ENCOUNTER — Other Ambulatory Visit: Payer: Self-pay

## 2019-09-02 DIAGNOSIS — M6281 Muscle weakness (generalized): Secondary | ICD-10-CM

## 2019-09-02 DIAGNOSIS — M25611 Stiffness of right shoulder, not elsewhere classified: Secondary | ICD-10-CM

## 2019-09-02 DIAGNOSIS — M5412 Radiculopathy, cervical region: Secondary | ICD-10-CM

## 2019-09-02 DIAGNOSIS — M542 Cervicalgia: Secondary | ICD-10-CM

## 2019-09-02 NOTE — Therapy (Signed)
[place] MAIN [facility] SERVICES [address] [place], [place], [address] Phone: [phone]   Fax:  [phone]  Physical Therapy Treatment  Patient Details  Name: Morgan [name] MRN: [id number] Date of Birth: Dec 09, 1940 Referring Provider (PT): Dr. Mayer Camel   Encounter Date: 09/02/2019  PT End of Session - 09/02/19 1056    Visit Number  22    Number of Visits  59    Date for PT Re-Evaluation  09/30/19    Authorization Type  goals updated 08/05/19    PT Start Time  1040    PT Stop Time  1130    PT Time Calculation (min)  50 min    Activity Tolerance  Patient tolerated treatment well    Behavior During Therapy  [facility] for tasks assessed/performed       Past Medical History:  Diagnosis Date  . Angina   . Arthritis   . Atrial fibrillation (HCC)    ASPIRIN FOR BLOOD THINNER  . Atypical mole 09/22/2016  . Bulging discs    lumbar   . Cancer ([place])    melanoma  . Complication of anesthesia    pt states has choking sensation with ET tube   . Coronary artery disease   . Depression   . Diabetes mellitus    diet controlled/on meds  . Family history of breast cancer   . Family history of colon cancer   . Fibromyalgia   . GERD (gastroesophageal reflux disease)   . H/O hiatal hernia   . Headache(784.0)    "recurring"  . High cholesterol   . History of colon polyps   . Hyperlipidemia   . Hypertension   . 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 Migraines    "til ~ 1980"  . PONV (postoperative nausea and vomiting)   . Restless leg syndrome   . Sciatic nerve pain    "from pinched nerve"  . Sleep apnea    uses CPAP  . Stroke [facility]) 2014   no deficits  . Weakness of right side of body    "I've had PT for it; they don't know what it's from".  CORTISONE INJECTION INTO BACK 08/30/12    Past Surgical History:  Procedure Laterality Date  . [place] STUDY N/A 12/29/2015   Procedure: [place] STUDY;  Surgeon: Manus Gunning, MD;  Location: [facility]  ENDOSCOPY;  Service: Gastroenterology;  Laterality: N/A;  . CARPAL TUNNEL RELEASE Left 07/02/2015   Procedure: CARPAL TUNNEL RELEASE;  Surgeon: Frederik Pear, MD;  Location: [place];  Service: Orthopedics;  Laterality: Left;  . CATARACT EXTRACTION, BILATERAL Bilateral    Oct and Nov 2017  . CORONARY ANGIOPLASTY WITH STENT PLACEMENT  06/2011   "1"  . CORONARY ARTERY BYPASS GRAFT  2005   CABG X 2  . DILATION AND CURETTAGE OF UTERUS     "more than once"  . ELBOW ARTHROSCOPY Left 07/02/2015   Procedure: ARTHROSCOPY LEFT ELBOW WITH DEBRIDEMENT AND REMOVAL LOOSE BODY;  Surgeon: Frederik Pear, MD;  Location: [place];  Service: Orthopedics;  Laterality: Left;  . ESOPHAGEAL MANOMETRY N/A 12/29/2015   Procedure: ESOPHAGEAL MANOMETRY (EM);  Surgeon: Manus Gunning, MD;  Location: [facility];  Service: Gastroenterology;  Laterality: N/A;  . ESOPHAGEAL MANOMETRY N/A 07/30/2019   Procedure: ESOPHAGEAL MANOMETRY (EM);  Surgeon: Yetta Flock, MD;  Location: [facility];  Service: Gastroenterology;  Laterality: N/A;  . FRACTURE SURGERY  ~ 2005   nose  .  KNEE ARTHROSCOPY  09/04/2012   Procedure: ARTHROSCOPY KNEE;  Surgeon: Gearlean Alf, MD;  Location: [facility];  Service: Orthopedics;  Laterality: Right;  right knee arthroscopy with medial and lateral meniscus debridement  . 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 MOUTH SURGERY  2004   "bone replacement; had cadavear bones put in; face was collapsing"  . NASAL SEPTUM SURGERY  ~ 1986  . TOTAL KNEE ARTHROPLASTY Right 07/07/2013   Procedure: RIGHT TOTAL KNEE ARTHROPLASTY;  Surgeon: Gearlean Alf, MD;  Location: [facility];  Service: Orthopedics;  Laterality: Right;    There were no vitals filed for this visit.  Subjective Assessment - 09/02/19 1054    Subjective  Patient reported that she is doing well today.  She states that she was sore for 2-3 days after last session over the areas where we  did soft tissue work.  She states that she thinks the numbness and tingling have slightly improved since last visit, experiencing less frequent episodes.    Pertinent History  On 08/03/18 pt was carrying a folding table and fell and right arm was under the handle got stuck between the door and the table. She landed on her R side. Tried to get up and had to pull at her arm. Pt had immediate pain in her right shoulder. She waited until Monday and went to see Dr. Edilia Bo her PCP. He took radiographs but didn't find any fractures. She continued to have pain so she called Dr. Damita Dunnings office, her orthopedist, and saw him that same week. He took additional radiographs and found a hairline fracture in her humeral shaft and a greater tuberosity fracture per patient report. She states that she is not healing well and has recurrent plain film radiographs. She is still wearing a sling and was encouraged not to perform any AROM of her right shoulder. Since her last visit with Dr. Mayer Camel on 09/20/18 she has had an additional fall and landed on her R shoulder. She reports additional bruising and pain in her R shoulder. She has a follow-up appt scheduled with her orthopedist for 10/15/18. 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 ROS negative for red flags. 06/05/19: Pt presents to physical therapy with reports of intermittent RUE numbness and tingling that began about 4 weeks ago. The numbness and tingling begins at the shoulder and extends down into the elbow. On days when it is more intense, it begins in the cervical spine. After about 5 seconds, numbness tingling subsides, and she experiences min hyperalgesia. Pt was told by physician that if numbness and tingling does not improve with conservative therapy after 3 months an MRI will be ordered. She is currently being seen in physical therapy for R shoulder pain due to recovery post greater tubercle fracture.    Limitations  Lifting    Diagnostic tests   Plain film radiographs with decreased healing per pt report    Patient Stated Goals  Improve ability to use RUE and decrease pain    Currently in Pain?  No/denies       TREATMENT   Trigger Point Dry Needling (TDN), unbilled Education performed with patient regarding potential benefit of TDN. Reviewed precautions and risks with patient. Reviewed special precautions/risks over lung fields which include pneumothorax. Reviewed signs and symptoms of pneumothorax and advised pt to go to ER immediately if these symptoms develop advise them of dry needling treatment. Extensive time spent with pt  to ensure full understanding of TDN risks. Pt provided verbal consent to treatment. TDN performed to R upper trap, supraspinatus, teres minor, deltoid, and infraspinatus with 0.30 x 50 single needle placements with local twitch response (LTR). Pistoning technique utilized. Improved pain-free motion following intervention.     Manual Therapy UBE 69mn forward/269m backward for warm-up during history; R pec stretch 30s hold x3  R latissimus dorsi stretch with pinning of the scapula 3x30sec R shoulder ER stretch 3x30s R shoulder IR stretch 3x30s R upper trap stretch 3x30s IASTM over infraspinatus, supraspinatus and teres minor   Mechanical Cervical Traction Intermittent, 30 degree pull angle,35# 30s hold time, 20# 10s rest time x 1249mtes total.Pt denies any numbness/tingling at end of traction.   Pt educated throughout session about proper posture and technique with exercises. Improved exercise technique, movement at target joints, use of target muscles after min to mod verbal cues.   Pt reports a slight relief of numbness and tingling symptoms today, however was sore for 2-3 days after last session.  She presented with trigger points in her infraspinatus, supraspinatus, upper trap, deltoid, and teres minor, which were addressed through TDN and manual therapy.  She reported slight relief  of symptoms after the session.  Will readdress nerve glides next session, and will adjust treatment sessions if she feels improvement from the scapular dry needling.  Pt will benefit from PT services to address deficits in strength, mobility, and pain in order to return to full function at home.            PT Short Term Goals - 08/05/19 1509      PT SHORT TERM GOAL #1   Title  Pt will be independent with HEP in order to improve strength and decrease pain in order to improve pain-free function at home.    Time  4    Period  Weeks    Status  Achieved        PT Long Term Goals - 08/05/19 1318      PT LONG TERM GOAL #1   Title  Pt will decrease quick DASH score to below 30% in order to demonstrate clinically significant reduction in disability.    Baseline  09/30/18: 84.1%; 10/30/18: 56.8%; 11/27/18: 45.45%; 12/23/18: 40.9%; 01/21/19: 29.5%, 6/4: 43%; 05/22/19: 43.2%; 08/05/19: 25%    Time  8    Period  Weeks    Status  Achieved      PT LONG TERM GOAL #2   Title  Pt will decrease worst shoulder pain as reported on NPRS to below 4/10 in order to demonstrate clinically significant reduction in pain.    Baseline  09/30/18: worst: 9/10; 10/30/18: 6/10; 11/27/18: 4/10; 12/23/18: 5/10 (pain is less frequent); 01/21/19: 5/10 (pain is less frequent); 03/19/19: 4/10; 6/4: 5/10; 05/22/19: 4/10 (decreased frequency of pain as well); 08/05/19: 2/10    Time  8    Period  Weeks    Status  Achieved      PT LONG TERM GOAL #3   Title  Pt will improve PROM of R shoulder to within 10 degrees of R shoulder in order to improve functional use of R shoulder for ADLs/IADLs.    Baseline  R/L 129/150 Shoulder flexion, 100/165 Shoulder abduction, 50/93 Shoulder external rotation, 50/70 Shoulder internal rotation; 10/30/18: R/L 140/150 Shoulder flexion, 132/165 Shoulder abduction, 82/93 Shoulder external rotation, 70/70 Shoulder internal rotation; 12/23/18:  R/L 155/150 Shoulder flexion, 134/165 Shoulder abduction,  89/93 Shoulder external rotation, 70/70 Shoulder internal rotation, 01/21/19:  R/L 154/150 Shoulder flexion, 136/165 Shoulder abduction, 85/93 Shoulder external rotation, 70/70 Shoulder internal rotation; 03/19/19: Unable to test over telemedicine 6/4: R: 128 flexion, 121 abduction, 78 shoulder ER, 70 shoulder IR; 05/22/19: R: 148 flexion (154 L side), 145 abduction (166 L side), 85 shoulder ER, 76 shoulder IR; 08/05/19: R flexion 150 deg (L 162) R abduction 165 (L 170); R ER 89; R IR 90    Time  8    Period  Weeks    Status  Partially Met    Target Date  09/30/19      PT LONG TERM GOAL #4   Title  Pt will improve pain-free R shoulder strength for flexion and abduction so that it is symmetrical with L shoulder in order to return to full function at home    Baseline  12/23/18: R/L 4-/4+ Shoulder flexion, 4/4+ Shoulder abduction, both painful on R side; 01/21/19: R/L 4/4+ Shoulder flexion, 4/4+ Shoulder abduction, both painful on R side; 03/19/19: Unable to test over telemedicine 6/4: 4-/4; 05/22/19: R/L: Flexion: 4/4+ (no pain bilateral) Abduction: 4-/4 (painful on R side, no pain on L side); 08/05/19: R/L flexion 4/5 (some pain on R), R/L abduction 4+/5 but no pain    Time  8    Period  Weeks    Status  Partially Met    Target Date  09/30/19      PT LONG TERM GOAL #5   Title  Pt will demonstrate decrease in NDI by at least 19% in order to demonstrate clinically significant reduction in disability related to neck injury/pain    Baseline  06/05/19: 36%; 08/05/19: 8%    Time  8    Period  Weeks    Status  Achieved            Plan - 09/02/19 1145    Clinical Impression Statement  Pt reports a slight relief of numbness and tingling symptoms today, however was sore for 2-3 days after last session.  She presented with trigger points in her infraspinatus, supraspinatus, upper trap, deltoid, and teres minor, which were addressed through TDN and manual therapy.  She reported slight relief of symptoms after the  session.  Will readdress nerve glides next session, and will adjust treatment sessions if she feels improvement from the scapular dry needling.  Pt will benefit from PT services to address deficits in strength, mobility, and pain in order to return to full function at home.    Personal Factors and Comorbidities  Age;Comorbidity 2;Past/Current Experience    Comorbidities  afib, CAD, fibromyalgia, DM, depression    Examination-Activity Limitations  Bathing;Carry    Examination-Participation Restrictions  Cleaning;Community Activity    Rehab Potential  Good    PT Frequency  2x / week    PT Duration  8 weeks    PT Treatment/Interventions  ADLs/Self Care Home Management;Aquatic Therapy;Canalith Repostioning;Cryotherapy;Electrical Stimulation;Iontophoresis '4mg'$ /ml Dexamethasone;Moist Heat;Traction;Ultrasound;Contrast Bath;DME Instruction;Gait training;Stair training;Functional mobility training;Therapeutic activities;Therapeutic exercise;Balance training;Neuromuscular re-education;Patient/family education;Manual techniques;Passive range of motion;Dry needling;Splinting;Taping;Vestibular;Spinal Manipulations    PT Next Visit Plan  reassess trigger points in scapular region. cervical retractions, deep neck flexor exercises, nerve glides, cervical traction, cervical mobilizations    PT Home Exercise Plan  Cervical retractions, Scapular retractions, R upper trap stretch, supine canes for AAROM flexion with hands together progressing to AROM supine flexion, AROM sidelying shoulder abduction, AROM sidelying shoulder ER, seated pulleys for flexion and scaption ([id number])       Patient will benefit from skilled therapeutic intervention in order to improve  the following deficits and impairments:  Decreased strength, Decreased range of motion, Impaired UE functional use, Pain  Visit Diagnosis: Cervicalgia  Radiculopathy, cervical region  Stiffness of right shoulder, not elsewhere classified  Muscle weakness  (generalized)     Problem List Patient Active Problem List   Diagnosis Date Noted  . Genetic testing 08/25/2019  . Dysphagia   . Globus sensation   . Family history of breast cancer   . History of colon polyps   . Chronic diarrhea 01/14/2019  . Malignant melanoma ([place]) 11/23/2016  . Urge incontinence 11/23/2016  . Atypical mole 09/22/2016  . Slurring of speech 05/12/2016  . Traumatic arthritis elbow 05/04/2015  . Left foot pain 12/31/2014  . Severe sprain of left ankle 12/31/2014  . Osteopenia 09/15/2014  . History of TIA (transient ischemic attack) 05/19/2014  . Fracture of coronoid process of ulna, left, closed 04/25/2014  . Ingrown toenail 01/15/2014  . Obesity (BMI 30-39.9) 10/08/2013  . OA (osteoarthritis) of knee 07/07/2013  . Chronic low back pain 05/09/2013  . Obstructive sleep apnea 05/09/2013  . GERD (gastroesophageal reflux disease) 05/09/2013  . Hiatal hernia 05/09/2013  . Fibromyalgia syndrome 05/09/2013  . Depression, major, in partial remission ([place]) 05/09/2013  . RLS (restless legs syndrome) 05/09/2013  . Diabetes mellitus with no complication ([place]) 54/65/6812  . High cholesterol 05/09/2013  . 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 Family history of colon cancer 05/09/2013  . Allergic rhinitis 05/09/2013  . Lateral meniscal tear 09/04/2012  . CAD (coronary artery disease) 09/27/2011  . HTN (hypertension) 09/27/2011  . Paroxysmal A-fib ([place]) 09/27/2011    This entire session was performed under direct supervision and direction of a licensed therapist/therapist assistant . I have personally read, edited and approve of the note as written.   Lutricia Horsfall, SPT [facility]out PT, DPT, GCS  Huprich,Jason 09/03/2019, 1:16 PM  [place] MAIN [facility] SERVICES [address] [place], [place], [address] Phone: [phone]   Fax:  [phone]  Name: Vanette Noguchi MRN: [id number] Date of Birth: Sep 23, 1941

## 2019-09-04 ENCOUNTER — Ambulatory Visit: Payer: Medicare Other | Admitting: Cardiology

## 2019-09-04 ENCOUNTER — Other Ambulatory Visit: Payer: Self-pay

## 2019-09-04 ENCOUNTER — Ambulatory Visit: Payer: Medicare Other

## 2019-09-04 ENCOUNTER — Encounter: Payer: Self-pay | Admitting: Cardiology

## 2019-09-04 VITALS — BP 130/72 | HR 77 | Ht 62.0 in | Wt 155.0 lb

## 2019-09-04 DIAGNOSIS — Z01810 Encounter for preprocedural cardiovascular examination: Secondary | ICD-10-CM

## 2019-09-04 DIAGNOSIS — I2510 Atherosclerotic heart disease of native coronary artery without angina pectoris: Secondary | ICD-10-CM

## 2019-09-04 DIAGNOSIS — I2583 Coronary atherosclerosis due to lipid rich plaque: Secondary | ICD-10-CM

## 2019-09-04 DIAGNOSIS — R0602 Shortness of breath: Secondary | ICD-10-CM | POA: Diagnosis not present

## 2019-09-04 DIAGNOSIS — M25611 Stiffness of right shoulder, not elsewhere classified: Secondary | ICD-10-CM

## 2019-09-04 DIAGNOSIS — E7800 Pure hypercholesterolemia, unspecified: Secondary | ICD-10-CM | POA: Diagnosis not present

## 2019-09-04 DIAGNOSIS — G4733 Obstructive sleep apnea (adult) (pediatric): Secondary | ICD-10-CM | POA: Diagnosis not present

## 2019-09-04 DIAGNOSIS — I10 Essential (primary) hypertension: Secondary | ICD-10-CM

## 2019-09-04 DIAGNOSIS — M542 Cervicalgia: Secondary | ICD-10-CM

## 2019-09-04 DIAGNOSIS — M5412 Radiculopathy, cervical region: Secondary | ICD-10-CM

## 2019-09-04 NOTE — Progress Notes (Signed)
Cardiology Office Note:    Date:  09/04/2019   ID:  Morgan [name], DOB [birth year], MRN [id number]  PCP:  Morgan Sanders, MD  Cardiologist:  Morgan Furbish, MD  Electrophysiologist:  None   Referring MD: Morgan Sanders, MD     History of Present Illness:    Morgan [name] is a 78 y.o. female here for follow-up of coronary artery disease status post bypass CABG 2006 with subsequent diagonal stent, diabetes, TIA/stroke cerebellar 2015 here for follow-up.  Angioedema as well.  Had a right posterior neck melanoma discovered by Dr. Radford [name].  She was very thankful.  Fell. Right arm sling. PT. Fracture. MRI. Still doing PT. Dr. Mayer [name]. May need surgery on neck. May also need fundoplication like procedure.  This would require anesthesia.  Stay at [facility] long.  Had colon and EGD same time. OSA -she is worried about having her CPAP during surgery.  She denies any fevers chills nausea vomiting syncope bleeding.  Very mild shortness of breath with activity which she is attributing to deconditioning.  Prior echocardiogram reassuring.  Prior stress test in 2017 also reassuring.  Past Medical History:  Diagnosis Date   Angina    Arthritis    Atrial fibrillation ([place])    ASPIRIN FOR BLOOD THINNER   Atypical mole 09/22/2016   Bulging discs    lumbar    Cancer ([place])    melanoma   Complication of anesthesia    pt states has choking sensation with ET tube    Coronary artery disease    Depression    Diabetes mellitus    diet controlled/on meds   Family history of breast cancer    Family history of colon cancer    Fibromyalgia    GERD (gastroesophageal reflux disease)    H/O hiatal hernia    Headache(784.0)    "recurring"   High cholesterol    History of colon polyps    Hyperlipidemia    Hypertension    [place] esophagus    Migraines    "til ~ 1980"   PONV (postoperative nausea and vomiting)    Restless leg syndrome    Sciatic nerve pain    "from pinched  nerve"   Sleep apnea    uses CPAP   Stroke ([place]) 2014   no deficits   Weakness of right side of body    "I've had PT for it; they don't know what it's from".  CORTISONE INJECTION INTO BACK 08/30/12    Past Surgical History:  Procedure Laterality Date   60 HOUR [place] STUDY N/A 12/29/2015   Procedure: 24 HOUR PH STUDY;  Surgeon: Manus Gunning, MD;  Location: [facility];  Service: Gastroenterology;  Laterality: N/A;   CARPAL TUNNEL RELEASE Left 07/02/2015   Procedure: CARPAL TUNNEL RELEASE;  Surgeon: Frederik Pear, MD;  Location: [place];  Service: Orthopedics;  Laterality: Left;   CATARACT EXTRACTION, BILATERAL Bilateral    Oct and Nov 2017   CORONARY ANGIOPLASTY WITH STENT PLACEMENT  06/2011   "1"   CORONARY ARTERY BYPASS GRAFT  2005   CABG X 2   DILATION AND CURETTAGE OF UTERUS     "more than once"   ELBOW ARTHROSCOPY Left 07/02/2015   Procedure: ARTHROSCOPY LEFT ELBOW WITH DEBRIDEMENT AND REMOVAL LOOSE BODY;  Surgeon: Frederik Pear, MD;  Location: [place];  Service: Orthopedics;  Laterality: Left;   ESOPHAGEAL MANOMETRY N/A 12/29/2015   Procedure: ESOPHAGEAL MANOMETRY (EM);  Surgeon: Manus Gunning, MD;  Location: [facility];  Service: Gastroenterology;  Laterality: N/A;   ESOPHAGEAL MANOMETRY N/A 07/30/2019   Procedure: ESOPHAGEAL MANOMETRY (EM);  Surgeon: Yetta Flock, MD;  Location: [facility];  Service: Gastroenterology;  Laterality: N/A;   FRACTURE SURGERY  ~ 2005   nose   KNEE ARTHROSCOPY  09/04/2012   Procedure: ARTHROSCOPY KNEE;  Surgeon: Gearlean Alf, MD;  Location: [facility];  Service: Orthopedics;  Laterality: Right;  right knee arthroscopy with medial and lateral meniscus debridement   MELANOMA EXCISION Right 10/13/2016   right side of neck   MOUTH SURGERY  2004   "bone replacement; had cadavear bones put in; face was collapsing"   NASAL SEPTUM SURGERY  ~ [place] Right  07/07/2013   Procedure: RIGHT TOTAL KNEE ARTHROPLASTY;  Surgeon: Gearlean Alf, MD;  Location: [facility];  Service: Orthopedics;  Laterality: Right;    Current Medications: Current Meds  Medication Sig   aspirin EC 81 MG tablet Take 1 tablet (81 mg total) by mouth daily.   atorvastatin (LIPITOR) 80 MG tablet TAKE 1 TABLET BY MOUTH EVERY DAY   CARAFATE 1 GM/10ML suspension TAKE 10 MLS (1 G TOTAL) BY MOUTH EVERY 6 (SIX) HOURS AS NEEDED.   cetirizine (ZYRTEC) 10 MG tablet Take 10 mg by mouth daily. Reported on 12/23/2015   chlorthalidone (HYGROTON) 25 MG tablet Take 12.5 mg by mouth daily.   clonazePAM (KLONOPIN) 1 MG tablet TAKE 2 TABLETS BY MOUTH AT BEDTIME   DEXILANT 60 MG capsule TAKE 1 CAPSULE BY MOUTH EVERY DAY   gabapentin (NEURONTIN) 300 MG capsule TAKE 1 TO 2 CAPSULES BY MOUTH AT BEDTIME   Insulin Pen Needle (NOVOFINE) 32G X 6 MM MISC USE TO INJECT VICTOZA DAILY. DX: E11.9   Lancet Device MISC Use to check blood sugar twice daily.   Lancets (FREESTYLE) lancets Use to check blood sugar twice daily   meloxicam (MOBIC) 15 MG tablet Take 15 mg by mouth daily. Reported on 12/23/2015   metFORMIN (GLUCOPHAGE-XR) 500 MG 24 hr tablet TAKE 2 TABLETS BY MOUTH TWICE A DAY   metoprolol succinate (TOPROL-XL) 100 MG 24 hr tablet TAKE 1 TABLET (100 MG TOTAL) BY MOUTH DAILY. TAKE WITH OR IMMEDIATELY FOLLOWING A MEAL.   Multiple Vitamins-Minerals (PRESERVISION AREDS PO) Take 1 tablet by mouth 2 (two) times daily. Reported on 12/23/2015   MYRBETRIQ 25 MG TB24 tablet TAKE 1 TABLET BY MOUTH EVERY DAY   nitroGLYCERIN (NITROSTAT) 0.4 MG SL tablet PLACE 1 TABLET (0.4 MG TOTAL) UNDER THE TONGUE EVERY 5 (FIVE) MINUTES AS NEEDED FOR CHEST PAIN   ONETOUCH ULTRA test strip USE TO CHECK BLOOD SUGAR TWICE DAILY   tiZANidine (ZANAFLEX) 4 MG tablet TAKE 1 TABLET BY MOUTH EVERY DAY IN THE EVENING   traMADol (ULTRAM) 50 MG tablet Take 50 mg by mouth every 6 (six) hours as needed. for pain   VICTOZA 18  MG/3ML SOPN INJECT 1.8 MG INTO THE SKIN DAILY.     Allergies:   Hydrocodone-acetaminophen, Percocet [oxycodone-acetaminophen], and Sulfa antibiotics   Social History   Socioeconomic History   Marital status: Married    Spouse name: Not on file   Number of children: 2   Years of education: college   Highest education level: Not on file  Occupational History   Occupation: Retired   Scientist, product/process development strain: Not on file   Food insecurity    Worry: Not on file    Inability: Not on file  Transportation needs    Medical: Not on file    Non-medical: Not on file  Tobacco Use   Smoking status: Never Smoker   Smokeless tobacco: Never Used  Substance and Sexual Activity   Alcohol use: Yes    Alcohol/week: 1.0 standard drinks    Types: 1 Glasses of wine per week    Comment: "occasionally drink wine"   Drug use: No   Sexual activity: Yes  Lifestyle   Physical activity    Days per week: Not on file    Minutes per session: Not on file   Stress: Not on file  Relationships   Social connections    Talks on phone: Not on file    Gets together: Not on file    Attends religious service: Not on file    Active member of club or organization: Not on file    Attends meetings of clubs or organizations: Not on file    Relationship status: Not on file  Other Topics Concern   Not on file  Social History Narrative   Drinks 1 cup of coffee a day      Family History: The patient's family history includes Breast cancer (age of onset: 53) in her mother; Cancer (age of onset: 40) in her mother and sister; Colon cancer in her sister; Dementia in her sister; Diabetes in her sister; GER disease in her daughter; Heart disease in her brother; Heart disease (age of onset: 9) in her father; Hypertension in her brother, daughter, and sister. There is no history of Heart attack or Stroke.  ROS:   Please see the history of present illness.    No fevers chills bleeding  syncope all other systems reviewed and are negative.  EKGs/Labs/Other Studies Reviewed:    The following studies were reviewed today:  ECHO 10/03/17: - Left ventricle: The cavity size was normal. Wall thickness was   increased in a pattern of mild LVH. Systolic function was normal.   The estimated ejection fraction was in the range of 55% to 60%.   Wall motion was normal; there were no regional wall motion   abnormalities. Doppler parameters are consistent with abnormal   left ventricular relaxation (grade 1 diastolic dysfunction). The   E/e&' ratio is >15, suggesting elevated LV filling pressure. - Left atrium: The atrium was normal in size. - Tricuspid valve: There was trivial regurgitation. - Pulmonary arteries: PA peak pressure: 31 mm Hg (S). - Inferior vena cava: The vessel was normal in size. The   respirophasic diameter changes were in the normal range (= 50%),   consistent with normal central venous pressure.  Impressions:  - Compared to a prior study in 2016, the LVEF is unchanged. There   is now grade 1 DD with elevated LV filling pressure.  Nuclear stress test 04/07/2016 was also low risk.  EKG: 09/04/2019-sinus rhythm 70 with no other abnormalities.  Prior sinus rhythm 71 nonspecific T wave changes personally reviewed and interpreted.  No change from prior EKG 06/04/2017.  Recent Labs: 12/23/2018: ALT 19; BUN 21; Creatinine, Ser 0.81; Potassium 4.3; Sodium 143  Recent Lipid Panel    Component Value Date/Time   CHOL 110 12/23/2018 1203   TRIG 72.0 12/23/2018 1203   HDL 45.60 12/23/2018 1203   CHOLHDL 2 12/23/2018 1203   VLDL 14.4 12/23/2018 1203   LDLCALC 50 12/23/2018 1203    Physical Exam:    VS:  BP 130/72    Pulse 77    Ht 5\' 2"  (  1.575 m)    Wt 155 lb (70.3 kg)    SpO2 97%    BMI 28.35 kg/m     Wt Readings from Last 3 Encounters:  09/04/19 155 lb (70.3 kg)  08/08/19 155 lb (70.3 kg)  06/30/19 151 lb (68.5 kg)     GEN: Well nourished, well developed,  in no acute distress  HEENT: normal  Neck: no JVD, carotid bruits, or masses Cardiac: RRR; no murmurs, rubs, or gallops,no edema  Respiratory:  clear to auscultation bilaterally, normal work of breathing GI: soft, nontender, nondistended, + BS MS: no deformity or atrophy  Skin: warm and dry, no rash Neuro:  Alert and Oriented x 3, Strength and sensation are intact, has a slight weakened gait Psych: euthymic mood, full affect   ASSESSMENT:    1. Coronary artery disease due to lipid rich plaque   2. Obstructive sleep apnea   3. Shortness of breath   4. Pure hypercholesterolemia   5. Essential hypertension   6. Pre-operative cardiovascular examination    PLAN:    In order of problems listed above:  Preoperative cardiac risk assessment: -She may proceed with general anesthesia, esophageal surgery with moderate overall cardiac risk.  She is not have any arrhythmias, no anginal symptoms, no syncope, no bleeding, no heart failure.  If aspirin needs to be held, this is reasonable.  She has had a prior stroke, prior stenting, prior bypass surgery.  Coronary artery disease status post bypass - Had drug-eluting stent placement to diagonal post bypass.   She is on high-dose aspirin because of prior stroke.    Disequilibrium -Could this possibly be from prior cerebellar stroke/TIA.  She is undergoing physical therapy.  Be careful with falls.  No changes.  Unfortunately broke her arm previously.  No changes made.  Hyperlipidemia - Continue with high intensity atorvastatin.  LDL in the 50s.  Excellent.  Diastolic dysfunction - Seen on echocardiogram, mild.  Hydrochlorothiazide was added at that time.  Overall feels better.  Less short of breath.  Continue with current plan.  Blood pressure under excellent control.  Overall reasonable.  Diabetes with hypertension -Well-controlled.  Hemoglobin A1c 6.3.  Obstructive sleep apnea -Dr. Radford [name].  She may need to bring her CPAP with her to the  hospital when she has surgery.  Medication Adjustments/Labs and Tests Ordered: Current medicines are reviewed at length with the patient today.  Concerns regarding medicines are outlined above.  Orders Placed This Encounter  Procedures   EKG 12-Lead   No orders of the defined types were placed in this encounter.   Patient Instructions  Medication Instructions:  Your physician recommends that you continue on your current medications as directed. Please refer to the Current Medication list given to you today.  *If you need a refill on your cardiac medications before your next appointment, please call your pharmacy*  Lab Work: None If you have labs (blood work) drawn today and your tests are completely normal, you will receive your results only by:  [place] (if you have MyChart) OR  A paper copy in the mail If you have any lab test that is abnormal or we need to change your treatment, we will call you to review the results.  Testing/Procedures: None  Follow-Up: At [facility], you and your health needs are our priority.  As part of our continuing mission to provide you with exceptional heart care, we have created designated Provider Care Teams.  These Care Teams include your primary Cardiologist (  physician) and Advanced Practice Providers (APPs -  Physician Assistants and Nurse Practitioners) who all work together to provide you with the care you need, when you need it.  Your next appointment:   12 months  The format for your next appointment:   In Person  Provider:   You may see Morgan Furbish, MD or one of the following Advanced Practice Providers on your designated Care Team:    Truitt Merle, NP  Cecilie Kicks, NP  Kathyrn Drown, NP   Other Instructions      Signed, Morgan Furbish, MD  09/04/2019 10:10 AM    [place]

## 2019-09-04 NOTE — Patient Instructions (Signed)
Medication Instructions:  Your physician recommends that you continue on your current medications as directed. Please refer to the Current Medication list given to you today.  *If you need a refill on your cardiac medications before your next appointment, please call your pharmacy*  Lab Work: None If you have labs (blood work) drawn today and your tests are completely normal, you will receive your results only by: [name] MyChart Message (if you have MyChart) OR . A paper copy in the mail If you have any lab test that is abnormal or we need to change your treatment, we will call you to review the results.  Testing/Procedures: None  Follow-Up: At [facility], you and your health needs are our priority.  As part of our continuing mission to provide you with exceptional heart care, we have created designated Provider Care Teams.  These Care Teams include your primary Cardiologist (physician) and Advanced Practice Providers (APPs -  Physician Assistants and Nurse Practitioners) who all work together to provide you with the care you need, when you need it.  Your next appointment:   12 months  The format for your next appointment:   In Person  Provider:   You may see Candee Furbish, MD or one of the following Advanced Practice Providers on your designated Care Team:    Truitt Merle, NP  Cecilie Kicks, NP  Kathyrn Drown, NP   Other Instructions

## 2019-09-04 NOTE — Therapy (Signed)
[place] MAIN [facility] SERVICES [address] [place], [place], [address] Phone: [phone]   Fax:  [phone]  Physical Therapy Treatment  Patient Details  Name: Morgan [name] MRN: [id number] Date of Birth: 1941-01-29 Referring Provider (PT): Dr. Mayer Camel   Encounter Date: 09/04/2019  PT End of Session - 09/04/19 1240    Visit Number  76    Number of Visits  80    Date for PT Re-Evaluation  09/30/19    Authorization Type  goals updated 08/05/19    PT Start Time  1045    PT Stop Time  1125    PT Time Calculation (min)  40 min    Activity Tolerance  Patient tolerated treatment well    Behavior During Therapy  [facility] for tasks assessed/performed       Past Medical History:  Diagnosis Date  . Angina   . Arthritis   . Atrial fibrillation (HCC)    ASPIRIN FOR BLOOD THINNER  . Atypical mole 09/22/2016  . Bulging discs    lumbar   . Cancer ([place])    melanoma  . Complication of anesthesia    pt states has choking sensation with ET tube   . Coronary artery disease   . Depression   . Diabetes mellitus    diet controlled/on meds  . Family history of breast cancer   . Family history of colon cancer   . Fibromyalgia   . GERD (gastroesophageal reflux disease)   . H/O hiatal hernia   . Headache(784.0)    "recurring"  . High cholesterol   . History of colon polyps   . Hyperlipidemia   . Hypertension   . 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 Migraines    "til ~ 1980"  . PONV (postoperative nausea and vomiting)   . Restless leg syndrome   . Sciatic nerve pain    "from pinched nerve"  . Sleep apnea    uses CPAP  . Stroke [facility]) 2014   no deficits  . Weakness of right side of body    "I've had PT for it; they don't know what it's from".  CORTISONE INJECTION INTO BACK 08/30/12    Past Surgical History:  Procedure Laterality Date  . [place] STUDY N/A 12/29/2015   Procedure: [place] STUDY;  Surgeon: Manus Gunning, MD;  Location: [facility];  Service: Gastroenterology;  Laterality: N/A;  . CARPAL TUNNEL RELEASE Left 07/02/2015   Procedure: CARPAL TUNNEL RELEASE;  Surgeon: Frederik Pear, MD;  Location: [place];  Service: Orthopedics;  Laterality: Left;  . CATARACT EXTRACTION, BILATERAL Bilateral    Oct and Nov 2017  . CORONARY ANGIOPLASTY WITH STENT PLACEMENT  06/2011   "1"  . CORONARY ARTERY BYPASS GRAFT  2005   CABG X 2  . DILATION AND CURETTAGE OF UTERUS     "more than once"  . ELBOW ARTHROSCOPY Left 07/02/2015   Procedure: ARTHROSCOPY LEFT ELBOW WITH DEBRIDEMENT AND REMOVAL LOOSE BODY;  Surgeon: Frederik Pear, MD;  Location: [place];  Service: Orthopedics;  Laterality: Left;  . ESOPHAGEAL MANOMETRY N/A 12/29/2015   Procedure: ESOPHAGEAL MANOMETRY (EM);  Surgeon: Manus Gunning, MD;  Location: [facility];  Service: Gastroenterology;  Laterality: N/A;  . ESOPHAGEAL MANOMETRY N/A 07/30/2019   Procedure: ESOPHAGEAL MANOMETRY (EM);  Surgeon: Yetta Flock, MD;  Location: [facility];  Service: Gastroenterology;  Laterality: N/A;  . FRACTURE SURGERY  ~ 2005   nose  .  KNEE ARTHROSCOPY  09/04/2012   Procedure: ARTHROSCOPY KNEE;  Surgeon: Gearlean Alf, MD;  Location: [facility];  Service: Orthopedics;  Laterality: Right;  right knee arthroscopy with medial and lateral meniscus debridement  . 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 MOUTH SURGERY  2004   "bone replacement; had cadavear bones put in; face was collapsing"  . NASAL SEPTUM SURGERY  ~ 1986  . TOTAL KNEE ARTHROPLASTY Right 07/07/2013   Procedure: RIGHT TOTAL KNEE ARTHROPLASTY;  Surgeon: Gearlean Alf, MD;  Location: [facility];  Service: Orthopedics;  Laterality: Right;    There were no vitals filed for this visit.  Subjective Assessment - 09/04/19 1052    Subjective  Patient reported that she is doing well today.  She reports that she was really sore after dry needling last session, and is still  sore today (4/10).  She reports that she had some chest pain yesterday, but that it went away.  She had a routine cardiology appt this morning, and states that all went well and that her EKG was normal.  No new questions or concerns at this time.    Pertinent History  On 08/03/18 pt was carrying a folding table and fell and right arm was under the handle got stuck between the door and the table. She landed on her R side. Tried to get up and had to pull at her arm. Pt had immediate pain in her right shoulder. She waited until Monday and went to see Dr. Edilia Bo her PCP. He took radiographs but didn't find any fractures. She continued to have pain so she called Dr. Damita Dunnings office, her orthopedist, and saw him that same week. He took additional radiographs and found a hairline fracture in her humeral shaft and a greater tuberosity fracture per patient report. She states that she is not healing well and has recurrent plain film radiographs. She is still wearing a sling and was encouraged not to perform any AROM of her right shoulder. Since her last visit with Dr. Mayer Camel on 09/20/18 she has had an additional fall and landed on her R shoulder. She reports additional bruising and pain in her R shoulder. She has a follow-up appt scheduled with her orthopedist for 10/15/18. 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 ROS negative for red flags. 06/05/19: Pt presents to physical therapy with reports of intermittent RUE numbness and tingling that began about 4 weeks ago. The numbness and tingling begins at the shoulder and extends down into the elbow. On days when it is more intense, it begins in the cervical spine. After about 5 seconds, numbness tingling subsides, and she experiences min hyperalgesia. Pt was told by physician that if numbness and tingling does not improve with conservative therapy after 3 months an MRI will be ordered. She is currently being seen in physical therapy for R shoulder pain  due to recovery post greater tubercle fracture.    Limitations  Lifting    Diagnostic tests  Plain film radiographs with decreased healing per pt report    Patient Stated Goals  Improve ability to use RUE and decrease pain    Currently in Pain?  Yes    Pain Score  4     Pain Location  Shoulder    Pain Orientation  Right    Pain Descriptors / Indicators  Sore    Pain Type  Chronic pain    Pain Onset  More  than a month ago    Pain Frequency  Intermittent       TREATMENT:  Therapeutic Exercise R Abduction with 2# DB 2x10 R Shoulder flexion with 2# DB 2x10 R Shoulder ER with red tband with band around wrist 2x10 R Scap lift offs 2x10     Manual Therapy UBE 3mn forward/227m backward for warm-up during history; R median nerve glides x 12; R ulnar nerve glides x12 R radial nerve glides x12 R pec stretch 30s hold x3  R upper trap stretch 3x30s R levator scapula stretch 3x30s  R scalene stretch 3x30s   Mechanical Cervical Traction, unbilled Intermittent, 30 degree pull angle,35# 30s hold time, 20# 10s rest time x 1254mtes total.Pt denies any numbness/tingling at end of traction.   Pt educated throughout session about proper posture and technique with exercises. Improved exercise technique, movement at target joints, use of target muscles after min to mod verbal cues.   Pt reports that she was sore after her last session with dry needling, stating that she is still sore today.  Upon observation, she presents with some R suprascapular atrophy.  She responded well to the initiation of dumbbell shoulder abduction and flexion exercises today, requiring min verbal and tactile cueing.  She continues to report increased nerve tension during nerve glides that relieves her tension after the intervention. Pt will benefit from PT services to address deficits in strength, mobility, and pain in order to return to full function at home.          PT Short Term Goals  - 08/05/19 1509      PT SHORT TERM GOAL #1   Title  Pt will be independent with HEP in order to improve strength and decrease pain in order to improve pain-free function at home.    Time  4    Period  Weeks    Status  Achieved        PT Long Term Goals - 08/05/19 1318      PT LONG TERM GOAL #1   Title  Pt will decrease quick DASH score to below 30% in order to demonstrate clinically significant reduction in disability.    Baseline  09/30/18: 84.1%; 10/30/18: 56.8%; 11/27/18: 45.45%; 12/23/18: 40.9%; 01/21/19: 29.5%, 6/4: 43%; 05/22/19: 43.2%; 08/05/19: 25%    Time  8    Period  Weeks    Status  Achieved      PT LONG TERM GOAL #2   Title  Pt will decrease worst shoulder pain as reported on NPRS to below 4/10 in order to demonstrate clinically significant reduction in pain.    Baseline  09/30/18: worst: 9/10; 10/30/18: 6/10; 11/27/18: 4/10; 12/23/18: 5/10 (pain is less frequent); 01/21/19: 5/10 (pain is less frequent); 03/19/19: 4/10; 6/4: 5/10; 05/22/19: 4/10 (decreased frequency of pain as well); 08/05/19: 2/10    Time  8    Period  Weeks    Status  Achieved      PT LONG TERM GOAL #3   Title  Pt will improve PROM of R shoulder to within 10 degrees of R shoulder in order to improve functional use of R shoulder for ADLs/IADLs.    Baseline  R/L 129/150 Shoulder flexion, 100/165 Shoulder abduction, 50/93 Shoulder external rotation, 50/70 Shoulder internal rotation; 10/30/18: R/L 140/150 Shoulder flexion, 132/165 Shoulder abduction, 82/93 Shoulder external rotation, 70/70 Shoulder internal rotation; 12/23/18:  R/L 155/150 Shoulder flexion, 134/165 Shoulder abduction, 89/93 Shoulder external rotation, 70/70 Shoulder internal rotation, 01/21/19: R/L 154/150 Shoulder flexion, 136/165  Shoulder abduction, 85/93 Shoulder external rotation, 70/70 Shoulder internal rotation; 03/19/19: Unable to test over telemedicine 6/4: R: 128 flexion, 121 abduction, 78 shoulder ER, 70 shoulder IR; 05/22/19: R: 148 flexion (154 L  side), 145 abduction (166 L side), 85 shoulder ER, 76 shoulder IR; 08/05/19: R flexion 150 deg (L 162) R abduction 165 (L 170); R ER 89; R IR 90    Time  8    Period  Weeks    Status  Partially Met    Target Date  09/30/19      PT LONG TERM GOAL #4   Title  Pt will improve pain-free R shoulder strength for flexion and abduction so that it is symmetrical with L shoulder in order to return to full function at home    Baseline  12/23/18: R/L 4-/4+ Shoulder flexion, 4/4+ Shoulder abduction, both painful on R side; 01/21/19: R/L 4/4+ Shoulder flexion, 4/4+ Shoulder abduction, both painful on R side; 03/19/19: Unable to test over telemedicine 6/4: 4-/4; 05/22/19: R/L: Flexion: 4/4+ (no pain bilateral) Abduction: 4-/4 (painful on R side, no pain on L side); 08/05/19: R/L flexion 4/5 (some pain on R), R/L abduction 4+/5 but no pain    Time  8    Period  Weeks    Status  Partially Met    Target Date  09/30/19      PT LONG TERM GOAL #5   Title  Pt will demonstrate decrease in NDI by at least 19% in order to demonstrate clinically significant reduction in disability related to neck injury/pain    Baseline  06/05/19: 36%; 08/05/19: 8%    Time  8    Period  Weeks    Status  Achieved            Plan - 09/04/19 1251    Clinical Impression Statement  Pt reports that she was sore after her last session with dry needling, stating that she is still sore today.  Upon observation, she presents with some R suprascapular atrophy.  She responded well to the initiation of dumbbell shoulder abduction and flexion exercises today, requiring min verbal and tactile cueing.  She continues to report increased nerve tension during nerve glides that relieves her tension after the intervention.  Pt will benefit from PT services to address deficits in strength, mobility, and pain in order to return to full function at home.    Personal Factors and Comorbidities  Age;Comorbidity 2;Past/Current Experience    Comorbidities  afib,  CAD, fibromyalgia, DM, depression    Examination-Activity Limitations  Bathing;Carry    Examination-Participation Restrictions  Cleaning;Community Activity    Rehab Potential  Good    PT Frequency  2x / week    PT Duration  8 weeks    PT Treatment/Interventions  ADLs/Self Care Home Management;Aquatic Therapy;Canalith Repostioning;Cryotherapy;Electrical Stimulation;Iontophoresis 74m/ml Dexamethasone;Moist Heat;Traction;Ultrasound;Contrast Bath;DME Instruction;Gait training;Stair training;Functional mobility training;Therapeutic activities;Therapeutic exercise;Balance training;Neuromuscular re-education;Patient/family education;Manual techniques;Passive range of motion;Dry needling;Splinting;Taping;Vestibular;Spinal Manipulations    PT Next Visit Plan  Progress note next session. reassess trigger points in scapular region. progress shoulder exercises.  cervical retractions, deep neck flexor exercises, nerve glides, cervical traction, cervical mobilizations    PT Home Exercise Plan  Cervical retractions, Scapular retractions, R upper trap stretch, supine canes for AAROM flexion with hands together progressing to AROM supine flexion, AROM sidelying shoulder abduction, AROM sidelying shoulder ER, seated pulleys for flexion and scaption ([id number])       Patient will benefit from skilled therapeutic intervention in order to improve the following  deficits and impairments:  Decreased strength, Decreased range of motion, Impaired UE functional use, Pain  Visit Diagnosis: Cervicalgia  Radiculopathy, cervical region  Stiffness of right shoulder, not elsewhere classified     Problem List Patient Active Problem List   Diagnosis Date Noted  . Genetic testing 08/25/2019  . Dysphagia   . Globus sensation   . Family history of breast cancer   . History of colon polyps   . Chronic diarrhea 01/14/2019  . Malignant melanoma ([place]) 11/23/2016  . Urge incontinence 11/23/2016  . Atypical mole 09/22/2016   . Slurring of speech 05/12/2016  . Traumatic arthritis elbow 05/04/2015  . Left foot pain 12/31/2014  . Severe sprain of left ankle 12/31/2014  . Osteopenia 09/15/2014  . History of TIA (transient ischemic attack) 05/19/2014  . Fracture of coronoid process of ulna, left, closed 04/25/2014  . Ingrown toenail 01/15/2014  . Obesity (BMI 30-39.9) 10/08/2013  . OA (osteoarthritis) of knee 07/07/2013  . Chronic low back pain 05/09/2013  . Obstructive sleep apnea 05/09/2013  . GERD (gastroesophageal reflux disease) 05/09/2013  . Hiatal hernia 05/09/2013  . Fibromyalgia syndrome 05/09/2013  . Depression, major, in partial remission ([place]) 05/09/2013  . RLS (restless legs syndrome) 05/09/2013  . Diabetes mellitus with no complication ([place]) 48/40/3979  . High cholesterol 05/09/2013  . 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 Family history of colon cancer 05/09/2013  . Allergic rhinitis 05/09/2013  . Lateral meniscal tear 09/04/2012  . CAD (coronary artery disease) 09/27/2011  . HTN (hypertension) 09/27/2011  . Paroxysmal A-fib ([place]) 09/27/2011    This entire session was performed under direct supervision and direction of a licensed therapist/therapist assistant . I have personally read, edited and approve of the note as written.   Lutricia Horsfall, SPT [place] [facility]out PT, DPT, GCS  Huprich,Jason 09/05/2019, 9:22 AM  [place] MAIN [facility] SERVICES [address] [place], [place], [address] Phone: [phone]   Fax:  [phone]  Name: Morgan [name] MRN: [id number] Date of Birth: 1941/05/04

## 2019-09-05 ENCOUNTER — Other Ambulatory Visit: Payer: Self-pay | Admitting: *Deleted

## 2019-09-05 DIAGNOSIS — K449 Diaphragmatic hernia without obstruction or gangrene: Secondary | ICD-10-CM

## 2019-09-05 DIAGNOSIS — K219 Gastro-esophageal reflux disease without esophagitis: Secondary | ICD-10-CM

## 2019-09-08 ENCOUNTER — Telehealth: Payer: Self-pay

## 2019-09-08 DIAGNOSIS — K219 Gastro-esophageal reflux disease without esophagitis: Secondary | ICD-10-CM

## 2019-09-08 DIAGNOSIS — R1310 Dysphagia, unspecified: Secondary | ICD-10-CM

## 2019-09-08 DIAGNOSIS — K449 Diaphragmatic hernia without obstruction or gangrene: Secondary | ICD-10-CM

## 2019-09-08 NOTE — Telephone Encounter (Signed)
Pt returned your call, pls call her again.  °

## 2019-09-08 NOTE — Telephone Encounter (Signed)
Left message with the patient's husband -requested the patient call back to the office to schedule her TIF procedure;

## 2019-09-08 NOTE — Telephone Encounter (Signed)
-----   Message from Larina Bras, [place] sent at 09/05/2019 10:56 AM EDT ----- Lorenza [place]- I worked with Dr C when this patient was at [facility]. I set her up for cardiology etc. She has cardiology clearance for anesthesia and asa hold x 5 days for TIF. Dr C said he sent you the info for TIF and I told him Id try to help get some things set in place to help you too.  I have told the patient that cardiology did give her anesthesia clearance and clearance for asa hold x 5 days. I also explained that medicare is not requiring precert for TIF or for her overnight obervation stay Lorriane Shire looked into this and has this noted in [place] ambulatory referral note). I advised her that Dr Bryan Lemma could possibly do her procedure on either 09/12/19 or 09/18/19 and that she would need an overnight stay so she should prepare for that. She is fine with either of these dates. I would take care of the rest for you but Im honestly not sure what other things you do to put everything into place (I am happy to do the rest if you want to tell me WHAT to do! Lol)  Hopefully this HELPS you and doesn't just jumble it up for you!  Dottie

## 2019-09-09 ENCOUNTER — Ambulatory Visit: Payer: Medicare Other

## 2019-09-09 ENCOUNTER — Other Ambulatory Visit: Payer: Self-pay

## 2019-09-09 DIAGNOSIS — M25611 Stiffness of right shoulder, not elsewhere classified: Secondary | ICD-10-CM

## 2019-09-09 DIAGNOSIS — M5412 Radiculopathy, cervical region: Secondary | ICD-10-CM

## 2019-09-09 DIAGNOSIS — K449 Diaphragmatic hernia without obstruction or gangrene: Secondary | ICD-10-CM

## 2019-09-09 DIAGNOSIS — M542 Cervicalgia: Secondary | ICD-10-CM

## 2019-09-09 DIAGNOSIS — K219 Gastro-esophageal reflux disease without esophagitis: Secondary | ICD-10-CM

## 2019-09-09 DIAGNOSIS — R1310 Dysphagia, unspecified: Secondary | ICD-10-CM

## 2019-09-09 NOTE — Therapy (Signed)
[place] MAIN [facility] SERVICES [address] [place], [place], [address] Phone: [phone]   Fax:  [phone]  Physical Therapy Progress Note   Dates of reporting period  08/07/19   to   09/09/19   Patient Details  Name: Morgan [name] MRN: [id number] Date of Birth: 1941/06/07 Referring Provider (PT): Dr. Mayer Camel   Encounter Date: 09/09/2019  PT End of Session - 09/09/19 1047    Visit Number  60    Number of Visits  81    Date for PT Re-Evaluation  09/30/19    Authorization Type  09/09/19    PT Start Time  1037    PT Stop Time  1120    PT Time Calculation (min)  43 min    Activity Tolerance  Patient tolerated treatment well    Behavior During Therapy  [facility] for tasks assessed/performed       Past Medical History:  Diagnosis Date  . Angina   . Arthritis   . Atrial fibrillation (HCC)    ASPIRIN FOR BLOOD THINNER  . Atypical mole 09/22/2016  . Bulging discs    lumbar   . Cancer ([place])    melanoma  . Complication of anesthesia    pt states has choking sensation with ET tube   . Coronary artery disease   . Depression   . Diabetes mellitus    diet controlled/on meds  . Family history of breast cancer   . Family history of colon cancer   . Fibromyalgia   . GERD (gastroesophageal reflux disease)   . H/O hiatal hernia   . Headache(784.0)    "recurring"  . High cholesterol   . History of colon polyps   . Hyperlipidemia   . Hypertension   . 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 Migraines    "til ~ 1980"  . PONV (postoperative nausea and vomiting)   . Restless leg syndrome   . Sciatic nerve pain    "from pinched nerve"  . Sleep apnea    uses CPAP  . Stroke [facility]) 2014   no deficits  . Weakness of right side of body    "I've had PT for it; they don't know what it's from".  CORTISONE INJECTION INTO BACK 08/30/12    Past Surgical History:  Procedure Laterality Date  . [place] STUDY N/A 12/29/2015   Procedure: [place] STUDY;   Surgeon: Manus Gunning, MD;  Location: [facility];  Service: Gastroenterology;  Laterality: N/A;  . CARPAL TUNNEL RELEASE Left 07/02/2015   Procedure: CARPAL TUNNEL RELEASE;  Surgeon: Frederik Pear, MD;  Location: [place];  Service: Orthopedics;  Laterality: Left;  . CATARACT EXTRACTION, BILATERAL Bilateral    Oct and Nov 2017  . CORONARY ANGIOPLASTY WITH STENT PLACEMENT  06/2011   "1"  . CORONARY ARTERY BYPASS GRAFT  2005   CABG X 2  . DILATION AND CURETTAGE OF UTERUS     "more than once"  . ELBOW ARTHROSCOPY Left 07/02/2015   Procedure: ARTHROSCOPY LEFT ELBOW WITH DEBRIDEMENT AND REMOVAL LOOSE BODY;  Surgeon: Frederik Pear, MD;  Location: [place];  Service: Orthopedics;  Laterality: Left;  . ESOPHAGEAL MANOMETRY N/A 12/29/2015   Procedure: ESOPHAGEAL MANOMETRY (EM);  Surgeon: Manus Gunning, MD;  Location: [facility];  Service: Gastroenterology;  Laterality: N/A;  . ESOPHAGEAL MANOMETRY N/A 07/30/2019   Procedure: ESOPHAGEAL MANOMETRY (EM);  Surgeon: Yetta Flock, MD;  Location: [facility];  Service: Gastroenterology;  Laterality: N/A;  . FRACTURE SURGERY  ~ 2005   nose  . KNEE ARTHROSCOPY  09/04/2012   Procedure: ARTHROSCOPY KNEE;  Surgeon: Gearlean Alf, MD;  Location: [facility];  Service: Orthopedics;  Laterality: Right;  right knee arthroscopy with medial and lateral meniscus debridement  . 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 MOUTH SURGERY  2004   "bone replacement; had cadavear bones put in; face was collapsing"  . NASAL SEPTUM SURGERY  ~ 1986  . TOTAL KNEE ARTHROPLASTY Right 07/07/2013   Procedure: RIGHT TOTAL KNEE ARTHROPLASTY;  Surgeon: Gearlean Alf, MD;  Location: [facility];  Service: Orthopedics;  Laterality: Right;    There were no vitals filed for this visit.  Subjective Assessment - 09/09/19 1129    Subjective  Patient reported that she is doing okay today.  She reports an increase in  numbness and tingling over the last few days.  She also reports that she is potentially getting esophogeal surgery in the coming week, and will ask her provider about PT limitations.    Pertinent History  On 08/03/18 pt was carrying a folding table and fell and right arm was under the handle got stuck between the door and the table. She landed on her R side. Tried to get up and had to pull at her arm. Pt had immediate pain in her right shoulder. She waited until Monday and went to see Dr. Edilia Bo her PCP. He took radiographs but didn't find any fractures. She continued to have pain so she called Dr. Damita Dunnings office, her orthopedist, and saw him that same week. He took additional radiographs and found a hairline fracture in her humeral shaft and a greater tuberosity fracture per patient report. She states that she is not healing well and has recurrent plain film radiographs. She is still wearing a sling and was encouraged not to perform any AROM of her right shoulder. Since her last visit with Dr. Mayer Camel on 09/20/18 she has had an additional fall and landed on her R shoulder. She reports additional bruising and pain in her R shoulder. She has a follow-up appt scheduled with her orthopedist for 10/15/18. 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 ROS negative for red flags. 06/05/19: Pt presents to physical therapy with reports of intermittent RUE numbness and tingling that began about 4 weeks ago. The numbness and tingling begins at the shoulder and extends down into the elbow. On days when it is more intense, it begins in the cervical spine. After about 5 seconds, numbness tingling subsides, and she experiences min hyperalgesia. Pt was told by physician that if numbness and tingling does not improve with conservative therapy after 3 months an MRI will be ordered. She is currently being seen in physical therapy for R shoulder pain due to recovery post greater tubercle fracture.    Limitations   Lifting    Diagnostic tests  Plain film radiographs with decreased healing per pt report    Patient Stated Goals  Improve ability to use RUE and decrease pain    Currently in Pain?  No/denies        TREATMENT:   Therapeutic Exercise R Abduction with 2# DB x12 with noticeable fatigue during last few reps  R Shoulder flexion with 2# DB x12  Goals reassessed:   PROM: R flexion 152 deg R abduction 160  MMT: R/L flexion 4+/4+, R/L abduction 4+/4+     Manual  Therapy UBE 26mn forward/256m backward for warm-up during history; R median nerve glides x 12; R ulnar nerve glides x12 R radial nerve glides x12 R upper trap stretch 3x30s R levator scapula stretch 3x30s  R scalene stretch 3x30s  STM to the R upper trap, R levator scapula, R cervical paraspinals, and R scalenes with relief of muscle tension reported at the end of the session.    Pt educated throughout session about proper posture and technique with exercises. Improved exercise technique, movement at target joints, use of target muscles after min to mod verbal cues.   Pt demonstrates good motivation throughout her PT sessions.  She has met 1 STG and 4 LTGs to date, and is making progress towards her last LTG.  Her strength has improved to 4+ bilaterally with shoulder abduction and shoulder flexion today, but she still demonstrates decrease R shoulder endurance.  Her PROM has improved to 152 deg with R shoulder flexion, and 160 deg with R shoulder abduction, demonstrating a lot of difficulty with relaxing. Pt continues to report fluctuating numbness and tingling in her RUE. Pt will benefit from PT services to address deficits in strength, mobility, and pain in order to return to full function at home.          PT Short Term Goals - 09/09/19 1137      PT SHORT TERM GOAL #1   Title  Pt will be independent with HEP in order to improve strength and decrease pain in order to improve pain-free function at  home.    Time  4    Period  Weeks    Status  Achieved        PT Long Term Goals - 09/09/19 1137      PT LONG TERM GOAL #1   Title  Pt will decrease quick DASH score to below 30% in order to demonstrate clinically significant reduction in disability.    Baseline  09/30/18: 84.1%; 10/30/18: 56.8%; 11/27/18: 45.45%; 12/23/18: 40.9%; 01/21/19: 29.5%, 6/4: 43%; 05/22/19: 43.2%; 08/05/19: 25%    Time  8    Period  Weeks    Status  Achieved      PT LONG TERM GOAL #2   Title  Pt will decrease worst shoulder pain as reported on NPRS to below 4/10 in order to demonstrate clinically significant reduction in pain.    Baseline  09/30/18: worst: 9/10; 10/30/18: 6/10; 11/27/18: 4/10; 12/23/18: 5/10 (pain is less frequent); 01/21/19: 5/10 (pain is less frequent); 03/19/19: 4/10; 6/4: 5/10; 05/22/19: 4/10 (decreased frequency of pain as well); 08/05/19: 2/10    Time  8    Period  Weeks    Status  Achieved      PT LONG TERM GOAL #3   Title  Pt will improve PROM of R shoulder to within 10 degrees of R shoulder in order to improve functional use of R shoulder for ADLs/IADLs.    Baseline  R/L 129/150 Shoulder flexion, 100/165 Shoulder abduction, 50/93 Shoulder external rotation, 50/70 Shoulder internal rotation; 10/30/18: R/L 140/150 Shoulder flexion, 132/165 Shoulder abduction, 82/93 Shoulder external rotation, 70/70 Shoulder internal rotation; 12/23/18:  R/L 155/150 Shoulder flexion, 134/165 Shoulder abduction, 89/93 Shoulder external rotation, 70/70 Shoulder internal rotation, 01/21/19: R/L 154/150 Shoulder flexion, 136/165 Shoulder abduction, 85/93 Shoulder external rotation, 70/70 Shoulder internal rotation; 03/19/19: Unable to test over telemedicine 6/4: R: 128 flexion, 121 abduction, 78 shoulder ER, 70 shoulder IR; 05/22/19: R: 148 flexion (154 L side), 145 abduction (166 L side), 85 shoulder ER,  76 shoulder IR; 08/05/19: R flexion 150 deg (L 162) R abduction 165 (L 170); R ER 89; R IR 90; 09/09/19: R flexion 152 deg R  abduction 160 (L can reach full end range with PROM)    Time  8    Period  Weeks    Status  Partially Met    Target Date  09/30/19      PT LONG TERM GOAL #4   Title  Pt will improve pain-free R shoulder strength for flexion and abduction so that it is symmetrical with L shoulder in order to return to full function at home    Baseline  12/23/18: R/L 4-/4+ Shoulder flexion, 4/4+ Shoulder abduction, both painful on R side; 01/21/19: R/L 4/4+ Shoulder flexion, 4/4+ Shoulder abduction, both painful on R side; 03/19/19: Unable to test over telemedicine 6/4: 4-/4; 05/22/19: R/L: Flexion: 4/4+ (no pain bilateral) Abduction: 4-/4 (painful on R side, no pain on L side); 08/05/19: R/L flexion 4/5 (some pain on R), R/L abduction 4+/5 but no pain; 09/09/19: R/L flexion 4+/4+, R/L abduction 4+/4+    Time  8    Period  Weeks    Status  Achieved    Target Date  09/30/19      PT LONG TERM GOAL #5   Title  Pt will demonstrate decrease in NDI by at least 19% in order to demonstrate clinically significant reduction in disability related to neck injury/pain    Baseline  06/05/19: 36%; 08/05/19: 8%    Time  8    Period  Weeks    Status  Achieved            Plan - 09/09/19 1144    Clinical Impression Statement  Pt demonstrates good motivation throughout her PT sessions.  She has met 1 STG and 4 LTGs to date, and is making progress towards her last LTG.  Her strength has improved to 4+ bilaterally with shoulder abduction and shoulder flexion today, but she still demonstrates decrease R shoulder endurance.  Her PROM has improved to 152 deg with R shoulder flexion, and 160 deg with R shoulder abduction, demonstrating a lot of difficulty with relaxing. Pt continues to report fluctuating numbness and tingling in her RUE.  Pt will benefit from PT services to address deficits in strength, mobility, and pain in order to return to full function at home.    Personal Factors and Comorbidities  Age;Comorbidity 2;Past/Current  Experience    Comorbidities  afib, CAD, fibromyalgia, DM, depression    Examination-Activity Limitations  Bathing;Carry    Examination-Participation Restrictions  Cleaning;Community Activity    Rehab Potential  Good    PT Frequency  2x / week    PT Duration  8 weeks    PT Treatment/Interventions  ADLs/Self Care Home Management;Aquatic Therapy;Canalith Repostioning;Cryotherapy;Electrical Stimulation;Iontophoresis 66m/ml Dexamethasone;Moist Heat;Traction;Ultrasound;Contrast Bath;DME Instruction;Gait training;Stair training;Functional mobility training;Therapeutic activities;Therapeutic exercise;Balance training;Neuromuscular re-education;Patient/family education;Manual techniques;Passive range of motion;Dry needling;Splinting;Taping;Vestibular;Spinal Manipulations    PT Next Visit Plan  TDN, reassess trigger points in scapular region. progress shoulder exercises.  cervical retractions, deep neck flexor exercises, nerve glides, cervical traction, cervical mobilizations    PT Home Exercise Plan  Cervical retractions, Scapular retractions, R upper trap stretch, supine canes for AAROM flexion with hands together progressing to AROM supine flexion, AROM sidelying shoulder abduction, AROM sidelying shoulder ER, seated pulleys for flexion and scaption ([id number])       Patient will benefit from skilled therapeutic intervention in order to improve the following deficits and impairments:  Decreased strength,  Decreased range of motion, Impaired UE functional use, Pain  Visit Diagnosis: Cervicalgia  Radiculopathy, cervical region  Stiffness of right shoulder, not elsewhere classified     Problem List Patient Active Problem List   Diagnosis Date Noted  . Genetic testing 08/25/2019  . Dysphagia   . Globus sensation   . Family history of breast cancer   . History of colon polyps   . Chronic diarrhea 01/14/2019  . Malignant melanoma ([place]) 11/23/2016  . Urge incontinence 11/23/2016  . Atypical mole  09/22/2016  . Slurring of speech 05/12/2016  . Traumatic arthritis elbow 05/04/2015  . Left foot pain 12/31/2014  . Severe sprain of left ankle 12/31/2014  . Osteopenia 09/15/2014  . History of TIA (transient ischemic attack) 05/19/2014  . Fracture of coronoid process of ulna, left, closed 04/25/2014  . Ingrown toenail 01/15/2014  . Obesity (BMI 30-39.9) 10/08/2013  . OA (osteoarthritis) of knee 07/07/2013  . Chronic low back pain 05/09/2013  . Obstructive sleep apnea 05/09/2013  . GERD (gastroesophageal reflux disease) 05/09/2013  . Hiatal hernia 05/09/2013  . Fibromyalgia syndrome 05/09/2013  . Depression, major, in partial remission ([place]) 05/09/2013  . RLS (restless legs syndrome) 05/09/2013  . Diabetes mellitus with no complication ([place]) 19/75/8832  . High cholesterol 05/09/2013  . 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 Family history of colon cancer 05/09/2013  . Allergic rhinitis 05/09/2013  . Lateral meniscal tear 09/04/2012  . CAD (coronary artery disease) 09/27/2011  . HTN (hypertension) 09/27/2011  . Paroxysmal A-fib ([place]) 09/27/2011    This entire session was performed under direct supervision and direction of a licensed therapist/therapist assistant . I have personally read, edited and approve of the note as written.   Lutricia Horsfall, SPT [facility]out PT, DPT, GCS  Huprich,Jason 09/10/2019, 1:50 PM  [place] MAIN [facility] SERVICES [address] [place], [place], [address] Phone: [phone]   Fax:  [phone]  Name: Morgan [name] MRN: [id number] Date of Birth: 26-Aug-1941

## 2019-09-09 NOTE — Telephone Encounter (Signed)
Called and spoke with patient-patient informed of procedure date for TIF of 10/16/2019 and is agreeable with plan of care; patient scheduled for COVID screening on 10/13/2019 at 3:00 pm; patient advised that instructions would be mailed to her home as well as being available on MyChart; Patient advised to call back to the office at [phone] should questions/concerns arise;  Patient verbalized understanding of information/instructions;    amb ref to gastro entered into Epic;

## 2019-09-11 ENCOUNTER — Ambulatory Visit: Payer: Medicare Other

## 2019-09-11 ENCOUNTER — Other Ambulatory Visit: Payer: Self-pay

## 2019-09-11 DIAGNOSIS — M542 Cervicalgia: Secondary | ICD-10-CM | POA: Diagnosis not present

## 2019-09-11 DIAGNOSIS — M25611 Stiffness of right shoulder, not elsewhere classified: Secondary | ICD-10-CM

## 2019-09-11 DIAGNOSIS — M6281 Muscle weakness (generalized): Secondary | ICD-10-CM

## 2019-09-11 DIAGNOSIS — M5412 Radiculopathy, cervical region: Secondary | ICD-10-CM

## 2019-09-11 NOTE — Therapy (Signed)
[place] MAIN [facility] SERVICES [address] [place], [place], [address] Phone: [phone]   Fax:  [phone]  Physical Therapy Treatment  Patient Details  Name: Morgan [name] MRN: [id number] Date of Birth: August 21, 1941 Referring Provider (PT): Dr. Mayer Camel   Encounter Date: 09/11/2019  PT End of Session - 09/11/19 1032    Visit Number  61    Number of Visits  81    Date for PT Re-Evaluation  09/30/19    Authorization Type  09/09/19    PT Start Time  1033    PT Stop Time  1115    PT Time Calculation (min)  42 min    Activity Tolerance  Patient tolerated treatment well    Behavior During Therapy  [facility] for tasks assessed/performed       Past Medical History:  Diagnosis Date  . Angina   . Arthritis   . Atrial fibrillation (HCC)    ASPIRIN FOR BLOOD THINNER  . Atypical mole 09/22/2016  . Bulging discs    lumbar   . Cancer ([place])    melanoma  . Complication of anesthesia    pt states has choking sensation with ET tube   . Coronary artery disease   . Depression   . Diabetes mellitus    diet controlled/on meds  . Family history of breast cancer   . Family history of colon cancer   . Fibromyalgia   . GERD (gastroesophageal reflux disease)   . H/O hiatal hernia   . Headache(784.0)    "recurring"  . High cholesterol   . History of colon polyps   . Hyperlipidemia   . Hypertension   . 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 Migraines    "til ~ 1980"  . PONV (postoperative nausea and vomiting)   . Restless leg syndrome   . Sciatic nerve pain    "from pinched nerve"  . Sleep apnea    uses CPAP  . Stroke [facility]) 2014   no deficits  . Weakness of right side of body    "I've had PT for it; they don't know what it's from".  CORTISONE INJECTION INTO BACK 08/30/12    Past Surgical History:  Procedure Laterality Date  . [place] STUDY N/A 12/29/2015   Procedure: [place] STUDY;  Surgeon: Manus Gunning, MD;  Location: [facility];   Service: Gastroenterology;  Laterality: N/A;  . CARPAL TUNNEL RELEASE Left 07/02/2015   Procedure: CARPAL TUNNEL RELEASE;  Surgeon: Frederik Pear, MD;  Location: [place];  Service: Orthopedics;  Laterality: Left;  . CATARACT EXTRACTION, BILATERAL Bilateral    Oct and Nov 2017  . CORONARY ANGIOPLASTY WITH STENT PLACEMENT  06/2011   "1"  . CORONARY ARTERY BYPASS GRAFT  2005   CABG X 2  . DILATION AND CURETTAGE OF UTERUS     "more than once"  . ELBOW ARTHROSCOPY Left 07/02/2015   Procedure: ARTHROSCOPY LEFT ELBOW WITH DEBRIDEMENT AND REMOVAL LOOSE BODY;  Surgeon: Frederik Pear, MD;  Location: [place];  Service: Orthopedics;  Laterality: Left;  . ESOPHAGEAL MANOMETRY N/A 12/29/2015   Procedure: ESOPHAGEAL MANOMETRY (EM);  Surgeon: Manus Gunning, MD;  Location: [facility];  Service: Gastroenterology;  Laterality: N/A;  . ESOPHAGEAL MANOMETRY N/A 07/30/2019   Procedure: ESOPHAGEAL MANOMETRY (EM);  Surgeon: Yetta Flock, MD;  Location: [facility];  Service: Gastroenterology;  Laterality: N/A;  . FRACTURE SURGERY  ~ 2005   nose  . KNEE  ARTHROSCOPY  09/04/2012   Procedure: ARTHROSCOPY KNEE;  Surgeon: Gearlean Alf, MD;  Location: [facility];  Service: Orthopedics;  Laterality: Right;  right knee arthroscopy with medial and lateral meniscus debridement  . 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 MOUTH SURGERY  2004   "bone replacement; had cadavear bones put in; face was collapsing"  . NASAL SEPTUM SURGERY  ~ 1986  . TOTAL KNEE ARTHROPLASTY Right 07/07/2013   Procedure: RIGHT TOTAL KNEE ARTHROPLASTY;  Surgeon: Gearlean Alf, MD;  Location: [facility];  Service: Orthopedics;  Laterality: Right;    There were no vitals filed for this visit.  Subjective Assessment - 09/11/19 1038    Subjective  Patient reported that she is doing okay today.  She reports soreness in her R shoulder today (7/10), but reports less numbness and tingling.   She is currently scheduled for her esophogeal surgery the first week of December.    Pertinent History  On 08/03/18 pt was carrying a folding table and fell and right arm was under the handle got stuck between the door and the table. She landed on her R side. Tried to get up and had to pull at her arm. Pt had immediate pain in her right shoulder. She waited until Monday and went to see Dr. Edilia Bo her PCP. He took radiographs but didn't find any fractures. She continued to have pain so she called Dr. Damita Dunnings office, her orthopedist, and saw him that same week. He took additional radiographs and found a hairline fracture in her humeral shaft and a greater tuberosity fracture per patient report. She states that she is not healing well and has recurrent plain film radiographs. She is still wearing a sling and was encouraged not to perform any AROM of her right shoulder. Since her last visit with Dr. Mayer Camel on 09/20/18 she has had an additional fall and landed on her R shoulder. She reports additional bruising and pain in her R shoulder. She has a follow-up appt scheduled with her orthopedist for 10/15/18. 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 ROS negative for red flags. 06/05/19: Pt presents to physical therapy with reports of intermittent RUE numbness and tingling that began about 4 weeks ago. The numbness and tingling begins at the shoulder and extends down into the elbow. On days when it is more intense, it begins in the cervical spine. After about 5 seconds, numbness tingling subsides, and she experiences min hyperalgesia. Pt was told by physician that if numbness and tingling does not improve with conservative therapy after 3 months an MRI will be ordered. She is currently being seen in physical therapy for R shoulder pain due to recovery post greater tubercle fracture.    Limitations  Lifting    Diagnostic tests  Plain film radiographs with decreased healing per pt report     Patient Stated Goals  Improve ability to use RUE and decrease pain    Currently in Pain?  Yes    Pain Score  7     Pain Location  Shoulder    Pain Orientation  Right    Pain Descriptors / Indicators  Sore    Pain Type  Chronic pain    Pain Onset  More than a month ago    Pain Frequency  Intermittent        TREATMENT  Therapeutic Exercise UBE 30mn forward/224m backward for warm-up during history; R Abduction with 3# DB  2x10; fatigue noted after 5th rep of 2nd set, utilizing trunk lateral bending compensations for momentum R Shoulder flexion with 2# DB 2x10 R Sidelying  Shoulder ER with 2# DB  2x10 R Scap lift offs 2x10  Standing rows with green band 2x10    Manual Therapy R median nerve glides x 12; R ulnar nerve glides x12 R radial nerve glides x12 R pec stretch 30s hold x3  R lat stretch 30s hold x3 R IR stretch 30s hold x3 R ER stretch 30s hold x3 STM to upper trap, cervical paraspinals, and scalenes     Pt educated throughout session about proper posture and technique with exercises. Improved exercise technique, movement at target joints, use of target muscles after min to mod verbal cues.   Pt demonstrates good motivation throughout today's session.  She responded well to increasing her weight on shoulder exercises, but does display fatigue half way through her second sets.  She benefited from min/mod verbal and tactile cueing for correct form throughout.  She continues to report a decrease in muscle tension after stretching and STM, but does endorse that her soreness was still present at the end of the session. Pt will benefit from PT services to address deficits in strength, mobility, and pain in order to return to full function at home.           PT Short Term Goals - 09/09/19 1137      PT SHORT TERM GOAL #1   Title  Pt will be independent with HEP in order to improve strength and decrease pain in order to improve pain-free function at  home.    Time  4    Period  Weeks    Status  Achieved        PT Long Term Goals - 09/09/19 1137      PT LONG TERM GOAL #1   Title  Pt will decrease quick DASH score to below 30% in order to demonstrate clinically significant reduction in disability.    Baseline  09/30/18: 84.1%; 10/30/18: 56.8%; 11/27/18: 45.45%; 12/23/18: 40.9%; 01/21/19: 29.5%, 6/4: 43%; 05/22/19: 43.2%; 08/05/19: 25%    Time  8    Period  Weeks    Status  Achieved      PT LONG TERM GOAL #2   Title  Pt will decrease worst shoulder pain as reported on NPRS to below 4/10 in order to demonstrate clinically significant reduction in pain.    Baseline  09/30/18: worst: 9/10; 10/30/18: 6/10; 11/27/18: 4/10; 12/23/18: 5/10 (pain is less frequent); 01/21/19: 5/10 (pain is less frequent); 03/19/19: 4/10; 6/4: 5/10; 05/22/19: 4/10 (decreased frequency of pain as well); 08/05/19: 2/10    Time  8    Period  Weeks    Status  Achieved      PT LONG TERM GOAL #3   Title  Pt will improve PROM of R shoulder to within 10 degrees of R shoulder in order to improve functional use of R shoulder for ADLs/IADLs.    Baseline  R/L 129/150 Shoulder flexion, 100/165 Shoulder abduction, 50/93 Shoulder external rotation, 50/70 Shoulder internal rotation; 10/30/18: R/L 140/150 Shoulder flexion, 132/165 Shoulder abduction, 82/93 Shoulder external rotation, 70/70 Shoulder internal rotation; 12/23/18:  R/L 155/150 Shoulder flexion, 134/165 Shoulder abduction, 89/93 Shoulder external rotation, 70/70 Shoulder internal rotation, 01/21/19: R/L 154/150 Shoulder flexion, 136/165 Shoulder abduction, 85/93 Shoulder external rotation, 70/70 Shoulder internal rotation; 03/19/19: Unable to test over telemedicine 6/4: R: 128 flexion, 121 abduction, 78 shoulder ER, 70 shoulder  IR; 05/22/19: R: 148 flexion (154 L side), 145 abduction (166 L side), 85 shoulder ER, 76 shoulder IR; 08/05/19: R flexion 150 deg (L 162) R abduction 165 (L 170); R ER 89; R IR 90; 09/09/19: R flexion 152 deg R  abduction 160 (L can reach full end range with PROM)    Time  8    Period  Weeks    Status  Partially Met    Target Date  09/30/19      PT LONG TERM GOAL #4   Title  Pt will improve pain-free R shoulder strength for flexion and abduction so that it is symmetrical with L shoulder in order to return to full function at home    Baseline  12/23/18: R/L 4-/4+ Shoulder flexion, 4/4+ Shoulder abduction, both painful on R side; 01/21/19: R/L 4/4+ Shoulder flexion, 4/4+ Shoulder abduction, both painful on R side; 03/19/19: Unable to test over telemedicine 6/4: 4-/4; 05/22/19: R/L: Flexion: 4/4+ (no pain bilateral) Abduction: 4-/4 (painful on R side, no pain on L side); 08/05/19: R/L flexion 4/5 (some pain on R), R/L abduction 4+/5 but no pain; 09/09/19: R/L flexion 4+/4+, R/L abduction 4+/4+    Time  8    Period  Weeks    Status  Achieved    Target Date  09/30/19      PT LONG TERM GOAL #5   Title  Pt will demonstrate decrease in NDI by at least 19% in order to demonstrate clinically significant reduction in disability related to neck injury/pain    Baseline  06/05/19: 36%; 08/05/19: 8%    Time  8    Period  Weeks    Status  Achieved            Plan - 09/11/19 1244    Clinical Impression Statement  Pt demonstrates good motivation throughout today's session.  She responded well to increasing her weight on shoulder exercises, but does display fatigue half way through her second sets.  She benefited from min/mod verbal and tactile cueing for correct form throughout.  She continues to report a decrease in muscle tension after stretching and STM, but does endorse that her soreness was still present at the end of the session.  Pt will benefit from PT services to address deficits in strength, mobility, and pain in order to return to full function at home.    Personal Factors and Comorbidities  Age;Comorbidity 2;Past/Current Experience    Comorbidities  afib, CAD, fibromyalgia, DM, depression     Examination-Activity Limitations  Bathing;Carry    Examination-Participation Restrictions  Cleaning;Community Activity    Rehab Potential  Good    PT Frequency  2x / week    PT Duration  8 weeks    PT Treatment/Interventions  ADLs/Self Care Home Management;Aquatic Therapy;Canalith Repostioning;Cryotherapy;Electrical Stimulation;Iontophoresis 59m/ml Dexamethasone;Moist Heat;Traction;Ultrasound;Contrast Bath;DME Instruction;Gait training;Stair training;Functional mobility training;Therapeutic activities;Therapeutic exercise;Balance training;Neuromuscular re-education;Patient/family education;Manual techniques;Passive range of motion;Dry needling;Splinting;Taping;Vestibular;Spinal Manipulations    PT Next Visit Plan  TDN, reassess trigger points in scapular region. progress shoulder exercises.  cervical retractions, deep neck flexor exercises, nerve glides, cervical traction, cervical mobilizations    PT Home Exercise Plan  Cervical retractions, Scapular retractions, R upper trap stretch, supine canes for AAROM flexion with hands together progressing to AROM supine flexion, AROM sidelying shoulder abduction, AROM sidelying shoulder ER, seated pulleys for flexion and scaption ([id number])       Patient will benefit from skilled therapeutic intervention in order to improve the following deficits and impairments:  Decreased strength, Decreased  range of motion, Impaired UE functional use, Pain  Visit Diagnosis: Radiculopathy, cervical region  Stiffness of right shoulder, not elsewhere classified  Muscle weakness (generalized)     Problem List Patient Active Problem List   Diagnosis Date Noted  . Genetic testing 08/25/2019  . Dysphagia   . Globus sensation   . Family history of breast cancer   . History of colon polyps   . Chronic diarrhea 01/14/2019  . Malignant melanoma ([place]) 11/23/2016  . Urge incontinence 11/23/2016  . Atypical mole 09/22/2016  . Slurring of speech 05/12/2016  .  Traumatic arthritis elbow 05/04/2015  . Left foot pain 12/31/2014  . Severe sprain of left ankle 12/31/2014  . Osteopenia 09/15/2014  . History of TIA (transient ischemic attack) 05/19/2014  . Fracture of coronoid process of ulna, left, closed 04/25/2014  . Ingrown toenail 01/15/2014  . Obesity (BMI 30-39.9) 10/08/2013  . OA (osteoarthritis) of knee 07/07/2013  . Chronic low back pain 05/09/2013  . Obstructive sleep apnea 05/09/2013  . GERD (gastroesophageal reflux disease) 05/09/2013  . Hiatal hernia 05/09/2013  . Fibromyalgia syndrome 05/09/2013  . Depression, major, in partial remission ([place]) 05/09/2013  . RLS (restless legs syndrome) 05/09/2013  . Diabetes mellitus with no complication ([place]) 49/70/2637  . High cholesterol 05/09/2013  . 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 Family history of colon cancer 05/09/2013  . Allergic rhinitis 05/09/2013  . Lateral meniscal tear 09/04/2012  . CAD (coronary artery disease) 09/27/2011  . HTN (hypertension) 09/27/2011  . Paroxysmal A-fib ([place]) 09/27/2011    This entire session was performed under direct supervision and direction of a licensed therapist/therapist assistant . I have personally read, edited and approve of the note as written.   Lutricia Horsfall, SPT [facility]out PT, DPT, GCS  Huprich,Jason 09/12/2019, 12:13 PM  [place] MAIN [facility] SERVICES [address] [place], [place], [address] Phone: [phone]   Fax:  [phone]  Name: Mayte Diers MRN: [id number] Date of Birth: December 21, 1940

## 2019-09-16 ENCOUNTER — Ambulatory Visit: Payer: Medicare Other | Attending: Orthopedic Surgery

## 2019-09-16 ENCOUNTER — Other Ambulatory Visit: Payer: Self-pay

## 2019-09-16 DIAGNOSIS — M25611 Stiffness of right shoulder, not elsewhere classified: Secondary | ICD-10-CM | POA: Diagnosis present

## 2019-09-16 DIAGNOSIS — M6281 Muscle weakness (generalized): Secondary | ICD-10-CM | POA: Diagnosis present

## 2019-09-16 DIAGNOSIS — M5412 Radiculopathy, cervical region: Secondary | ICD-10-CM | POA: Insufficient documentation

## 2019-09-16 NOTE — Therapy (Signed)
[place] [facility] SERVICES [address] [place], [place], [address] Phone: [phone]   Fax:  [phone]  Physical Therapy Treatment  Patient Details  Name: Morgan [name] MRN: [id number] Date of Birth: Jun 20, 1941 Referring Provider (PT): Dr. Mayer Camel   Encounter Date: 09/16/2019  PT End of Session - 09/16/19 1318    Visit Number  83    Number of Visits  81    Date for PT Re-Evaluation  09/30/19    Authorization Type  09/09/19    PT Start Time  1318    PT Stop Time  1345    PT Time Calculation (min)  27 min    Activity Tolerance  Patient tolerated treatment well    Behavior During Therapy  [facility] for tasks assessed/performed       Past Medical History:  Diagnosis Date  . Angina   . Arthritis   . Atrial fibrillation (HCC)    ASPIRIN FOR BLOOD THINNER  . Atypical mole 09/22/2016  . Bulging discs    lumbar   . Cancer ([place])    melanoma  . Complication of anesthesia    pt states has choking sensation with ET tube   . Coronary artery disease   . Depression   . Diabetes mellitus    diet controlled/on meds  . Family history of breast cancer   . Family history of colon cancer   . Fibromyalgia   . GERD (gastroesophageal reflux disease)   . H/O hiatal hernia   . Headache(784.0)    "recurring"  . High cholesterol   . History of colon polyps   . Hyperlipidemia   . Hypertension   . 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 Migraines    "til ~ 1980"  . PONV (postoperative nausea and vomiting)   . Restless leg syndrome   . Sciatic nerve pain    "from pinched nerve"  . Sleep apnea    uses CPAP  . Stroke [facility]) 2014   no deficits  . Weakness of right side of body    "I've had PT for it; they don't know what it's from".  CORTISONE INJECTION INTO BACK 08/30/12    Past Surgical History:  Procedure Laterality Date  . [place] STUDY N/A 12/29/2015   Procedure: [place] STUDY;  Surgeon: Manus Gunning, MD;  Location: [facility];   Service: Gastroenterology;  Laterality: N/A;  . CARPAL TUNNEL RELEASE Left 07/02/2015   Procedure: CARPAL TUNNEL RELEASE;  Surgeon: Frederik Pear, MD;  Location: [place];  Service: Orthopedics;  Laterality: Left;  . CATARACT EXTRACTION, BILATERAL Bilateral    Oct and Nov 2017  . CORONARY ANGIOPLASTY WITH STENT PLACEMENT  06/2011   "1"  . CORONARY ARTERY BYPASS GRAFT  2005   CABG X 2  . DILATION AND CURETTAGE OF UTERUS     "more than once"  . ELBOW ARTHROSCOPY Left 07/02/2015   Procedure: ARTHROSCOPY LEFT ELBOW WITH DEBRIDEMENT AND REMOVAL LOOSE BODY;  Surgeon: Frederik Pear, MD;  Location: [place];  Service: Orthopedics;  Laterality: Left;  . ESOPHAGEAL MANOMETRY N/A 12/29/2015   Procedure: ESOPHAGEAL MANOMETRY (EM);  Surgeon: Manus Gunning, MD;  Location: [facility];  Service: Gastroenterology;  Laterality: N/A;  . ESOPHAGEAL MANOMETRY N/A 07/30/2019   Procedure: ESOPHAGEAL MANOMETRY (EM);  Surgeon: Yetta Flock, MD;  Location: [facility];  Service: Gastroenterology;  Laterality: N/A;  . FRACTURE SURGERY  ~ 2005   nose  . KNEE  ARTHROSCOPY  09/04/2012   Procedure: ARTHROSCOPY KNEE;  Surgeon: Gearlean Alf, MD;  Location: [facility];  Service: Orthopedics;  Laterality: Right;  right knee arthroscopy with medial and lateral meniscus debridement  . 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 MOUTH SURGERY  2004   "bone replacement; had cadavear bones put in; face was collapsing"  . NASAL SEPTUM SURGERY  ~ 1986  . TOTAL KNEE ARTHROPLASTY Right 07/07/2013   Procedure: RIGHT TOTAL KNEE ARTHROPLASTY;  Surgeon: Gearlean Alf, MD;  Location: [facility];  Service: Orthopedics;  Laterality: Right;    There were no vitals filed for this visit.  Subjective Assessment - 09/16/19 1323    Subjective  Patient reported that she is doing okay today. She reports that she fell yesterday and landed on her LUE and states that she hit her head, but  denies any concussive symptoms.  She states that the numbness and tinging is still on the R side, but that it is diminished, stating that some days are worse than others.    Pertinent History  On 08/03/18 pt was carrying a folding table and fell and right arm was under the handle got stuck between the door and the table. She landed on her R side. Tried to get up and had to pull at her arm. Pt had immediate pain in her right shoulder. She waited until Monday and went to see Dr. Edilia Bo her PCP. He took radiographs but didn't find any fractures. She continued to have pain so she called Dr. Damita Dunnings office, her orthopedist, and saw him that same week. He took additional radiographs and found a hairline fracture in her humeral shaft and a greater tuberosity fracture per patient report. She states that she is not healing well and has recurrent plain film radiographs. She is still wearing a sling and was encouraged not to perform any AROM of her right shoulder. Since her last visit with Dr. Mayer Camel on 09/20/18 she has had an additional fall and landed on her R shoulder. She reports additional bruising and pain in her R shoulder. She has a follow-up appt scheduled with her orthopedist for 10/15/18. 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 ROS negative for red flags. 06/05/19: Pt presents to physical therapy with reports of intermittent RUE numbness and tingling that began about 4 weeks ago. The numbness and tingling begins at the shoulder and extends down into the elbow. On days when it is more intense, it begins in the cervical spine. After about 5 seconds, numbness tingling subsides, and she experiences min hyperalgesia. Pt was told by physician that if numbness and tingling does not improve with conservative therapy after 3 months an MRI will be ordered. She is currently being seen in physical therapy for R shoulder pain due to recovery post greater tubercle fracture.    Limitations  Lifting     Diagnostic tests  Plain film radiographs with decreased healing per pt report    Patient Stated Goals  Improve ability to use RUE and decrease pain    Currently in Pain?  Yes    Pain Score  5     Pain Location  Shoulder    Pain Orientation  Right    Pain Descriptors / Indicators  Sore;Aching    Pain Type  Chronic pain    Pain Onset  More than a month ago        TREATMENT  Therapeutic Exercise  UBE 79mn forward/273m backward for warm-up during history; RAbduction with 3# DB 2x10; fatigue noted after 5th rep of 2nd set, utilizing trunk lateral bending compensations for momentum RShoulder flexion with 3# DB 2x10 R Scap lift offs 2x10 Standing R ER 2x10 with green band Standing R shoulder extension 2x10 Standing rows with green band 2x12 with moderate verbal and tactile cueing for form    Manual Therapy STM to R upper trap, upper cervical paraspinals,   Pt educated throughout session about proper posture and technique with exercises. Improved exercise technique, movement at target joints, use of target muscles after min to mod verbal cues.   Pt was late to today's session, but demonstrates good motivation throughout.  She continues to benefit from min/mod verbal and tactile cueing for correct form throughout, displaying difficulty with maintaining proper form with shoulder exercises  She continues to report a decrease in muscle tension after STM to her cervical paraspinals and upper trap.Pt will benefit from PT services to address deficits in strength, mobility, and pain in order to return to full function at home.                          PT Short Term Goals - 09/09/19 1137      PT SHORT TERM GOAL #1   Title  Pt will be independent with HEP in order to improve strength and decrease pain in order to improve pain-free function at home.    Time  4    Period  Weeks    Status  Achieved        PT Long Term Goals - 09/09/19 1137      PT  LONG TERM GOAL #1   Title  Pt will decrease quick DASH score to below 30% in order to demonstrate clinically significant reduction in disability.    Baseline  09/30/18: 84.1%; 10/30/18: 56.8%; 11/27/18: 45.45%; 12/23/18: 40.9%; 01/21/19: 29.5%, 6/4: 43%; 05/22/19: 43.2%; 08/05/19: 25%    Time  8    Period  Weeks    Status  Achieved      PT LONG TERM GOAL #2   Title  Pt will decrease worst shoulder pain as reported on NPRS to below 4/10 in order to demonstrate clinically significant reduction in pain.    Baseline  09/30/18: worst: 9/10; 10/30/18: 6/10; 11/27/18: 4/10; 12/23/18: 5/10 (pain is less frequent); 01/21/19: 5/10 (pain is less frequent); 03/19/19: 4/10; 6/4: 5/10; 05/22/19: 4/10 (decreased frequency of pain as well); 08/05/19: 2/10    Time  8    Period  Weeks    Status  Achieved      PT LONG TERM GOAL #3   Title  Pt will improve PROM of R shoulder to within 10 degrees of R shoulder in order to improve functional use of R shoulder for ADLs/IADLs.    Baseline  R/L 129/150 Shoulder flexion, 100/165 Shoulder abduction, 50/93 Shoulder external rotation, 50/70 Shoulder internal rotation; 10/30/18: R/L 140/150 Shoulder flexion, 132/165 Shoulder abduction, 82/93 Shoulder external rotation, 70/70 Shoulder internal rotation; 12/23/18:  R/L 155/150 Shoulder flexion, 134/165 Shoulder abduction, 89/93 Shoulder external rotation, 70/70 Shoulder internal rotation, 01/21/19: R/L 154/150 Shoulder flexion, 136/165 Shoulder abduction, 85/93 Shoulder external rotation, 70/70 Shoulder internal rotation; 03/19/19: Unable to test over telemedicine 6/4: R: 128 flexion, 121 abduction, 78 shoulder ER, 70 shoulder IR; 05/22/19: R: 148 flexion (154 L side), 145 abduction (166 L side), 85 shoulder ER, 76 shoulder IR; 08/05/19: R flexion 150 deg (L  162) R abduction 165 (L 170); R ER 89; R IR 90; 09/09/19: R flexion 152 deg R abduction 160 (L can reach full end range with PROM)    Time  8    Period  Weeks    Status  Partially Met    Target  Date  09/30/19      PT LONG TERM GOAL #4   Title  Pt will improve pain-free R shoulder strength for flexion and abduction so that it is symmetrical with L shoulder in order to return to full function at home    Baseline  12/23/18: R/L 4-/4+ Shoulder flexion, 4/4+ Shoulder abduction, both painful on R side; 01/21/19: R/L 4/4+ Shoulder flexion, 4/4+ Shoulder abduction, both painful on R side; 03/19/19: Unable to test over telemedicine 6/4: 4-/4; 05/22/19: R/L: Flexion: 4/4+ (no pain bilateral) Abduction: 4-/4 (painful on R side, no pain on L side); 08/05/19: R/L flexion 4/5 (some pain on R), R/L abduction 4+/5 but no pain; 09/09/19: R/L flexion 4+/4+, R/L abduction 4+/4+    Time  8    Period  Weeks    Status  Achieved    Target Date  09/30/19      PT LONG TERM GOAL #5   Title  Pt will demonstrate decrease in NDI by at least 19% in order to demonstrate clinically significant reduction in disability related to neck injury/pain    Baseline  06/05/19: 36%; 08/05/19: 8%    Time  8    Period  Weeks    Status  Achieved            Plan - 09/16/19 1359    Clinical Impression Statement  Pt was late to today's session, but demonstrates good motivation throughout.  She continues to benefit from min/mod verbal and tactile cueing for correct form throughout, displaying difficulty with maintaining proper form with shoulder exercises  She continues to report a decrease in muscle tension after STM to her cervical paraspinals and upper trap.  Pt will benefit from PT services to address deficits in strength, mobility, and pain in order to return to full function at home.    Personal Factors and Comorbidities  Age;Comorbidity 2;Past/Current Experience    Comorbidities  afib, CAD, fibromyalgia, DM, depression    Examination-Activity Limitations  Bathing;Carry    Examination-Participation Restrictions  Cleaning;Community Activity    Rehab Potential  Good    PT Frequency  2x / week    PT Duration  8 weeks    PT  Treatment/Interventions  ADLs/Self Care Home Management;Aquatic Therapy;Canalith Repostioning;Cryotherapy;Electrical Stimulation;Iontophoresis 33m/ml Dexamethasone;Moist Heat;Traction;Ultrasound;Contrast Bath;DME Instruction;Gait training;Stair training;Functional mobility training;Therapeutic activities;Therapeutic exercise;Balance training;Neuromuscular re-education;Patient/family education;Manual techniques;Passive range of motion;Dry needling;Splinting;Taping;Vestibular;Spinal Manipulations    PT Next Visit Plan  TDN, reassess trigger points in scapular region. progress shoulder exercises.  cervical retractions, deep neck flexor exercises, nerve glides, cervical traction, cervical mobilizations    PT Home Exercise Plan  Cervical retractions, Scapular retractions, R upper trap stretch, supine canes for AAROM flexion with hands together progressing to AROM supine flexion, AROM sidelying shoulder abduction, AROM sidelying shoulder ER, seated pulleys for flexion and scaption ([id number])       Patient will benefit from skilled therapeutic intervention in order to improve the following deficits and impairments:  Decreased strength, Decreased range of motion, Impaired UE functional use, Pain  Visit Diagnosis: Radiculopathy, cervical region  Stiffness of right shoulder, not elsewhere classified  Muscle weakness (generalized)     Problem List Patient Active Problem List   Diagnosis Date Noted  .  Genetic testing 08/25/2019  . Dysphagia   . Globus sensation   . Family history of breast cancer   . History of colon polyps   . Chronic diarrhea 01/14/2019  . Malignant melanoma ([place]) 11/23/2016  . Urge incontinence 11/23/2016  . Atypical mole 09/22/2016  . Slurring of speech 05/12/2016  . Traumatic arthritis elbow 05/04/2015  . Left foot pain 12/31/2014  . Severe sprain of left ankle 12/31/2014  . Osteopenia 09/15/2014  . History of TIA (transient ischemic attack) 05/19/2014  . Fracture of  coronoid process of ulna, left, closed 04/25/2014  . Ingrown toenail 01/15/2014  . Obesity (BMI 30-39.9) 10/08/2013  . OA (osteoarthritis) of knee 07/07/2013  . Chronic low back pain 05/09/2013  . Obstructive sleep apnea 05/09/2013  . GERD (gastroesophageal reflux disease) 05/09/2013  . Hiatal hernia 05/09/2013  . Fibromyalgia syndrome 05/09/2013  . Depression, major, in partial remission ([place]) 05/09/2013  . RLS (restless legs syndrome) 05/09/2013  . Diabetes mellitus with no complication ([place]) 44/92/0100  . High cholesterol 05/09/2013  . 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 Family history of colon cancer 05/09/2013  . Allergic rhinitis 05/09/2013  . Lateral meniscal tear 09/04/2012  . CAD (coronary artery disease) 09/27/2011  . HTN (hypertension) 09/27/2011  . Paroxysmal A-fib ([place]) 09/27/2011    This entire session was performed under direct supervision and direction of a licensed therapist/therapist assistant . I have personally read, edited and approve of the note as written.   Lutricia Horsfall, SPT [facility]out PT, DPT, GCS  Huprich,Jason 09/17/2019, 10:36 AM  [place] [facility] SERVICES [address] [place], [place], [address] Phone: [phone]   Fax:  [phone]  Name: Morgan [name] MRN: [id number] Date of Birth: October 19, 1941

## 2019-09-22 ENCOUNTER — Other Ambulatory Visit: Payer: Self-pay

## 2019-09-22 ENCOUNTER — Ambulatory Visit: Payer: Medicare Other

## 2019-09-22 DIAGNOSIS — M5412 Radiculopathy, cervical region: Secondary | ICD-10-CM

## 2019-09-22 DIAGNOSIS — M6281 Muscle weakness (generalized): Secondary | ICD-10-CM

## 2019-09-22 DIAGNOSIS — M25611 Stiffness of right shoulder, not elsewhere classified: Secondary | ICD-10-CM

## 2019-09-22 NOTE — Therapy (Signed)
[place] MAIN [facility] SERVICES [address] [place], [place], [address] Phone: [phone]   Fax:  [phone]  Physical Therapy Treatment  Patient Details  Name: Morgan [name] MRN: [id number] Date of Birth: 1940-12-01 Referring Provider (PT): Dr. Mayer Camel   Encounter Date: 09/22/2019  PT End of Session - 09/22/19 0847    Visit Number  63    Number of Visits  81    Date for PT Re-Evaluation  09/30/19    Authorization Type  09/09/19    PT Start Time  0847    PT Stop Time  0930    PT Time Calculation (min)  43 min    Activity Tolerance  Patient tolerated treatment well    Behavior During Therapy  [facility] for tasks assessed/performed       Past Medical History:  Diagnosis Date  . Angina   . Arthritis   . Atrial fibrillation (HCC)    ASPIRIN FOR BLOOD THINNER  . Atypical mole 09/22/2016  . Bulging discs    lumbar   . Cancer ([place])    melanoma  . Complication of anesthesia    pt states has choking sensation with ET tube   . Coronary artery disease   . Depression   . Diabetes mellitus    diet controlled/on meds  . Family history of breast cancer   . Family history of colon cancer   . Fibromyalgia   . GERD (gastroesophageal reflux disease)   . H/O hiatal hernia   . Headache(784.0)    "recurring"  . High cholesterol   . History of colon polyps   . Hyperlipidemia   . Hypertension   . 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 Migraines    "til ~ 1980"  . PONV (postoperative nausea and vomiting)   . Restless leg syndrome   . Sciatic nerve pain    "from pinched nerve"  . Sleep apnea    uses CPAP  . Stroke [facility]) 2014   no deficits  . Weakness of right side of body    "I've had PT for it; they don't know what it's from".  CORTISONE INJECTION INTO BACK 08/30/12    Past Surgical History:  Procedure Laterality Date  . [place] STUDY N/A 12/29/2015   Procedure: [place] STUDY;  Surgeon: Manus Gunning, MD;  Location: [facility];   Service: Gastroenterology;  Laterality: N/A;  . CARPAL TUNNEL RELEASE Left 07/02/2015   Procedure: CARPAL TUNNEL RELEASE;  Surgeon: Frederik Pear, MD;  Location: [place];  Service: Orthopedics;  Laterality: Left;  . CATARACT EXTRACTION, BILATERAL Bilateral    Oct and Nov 2017  . CORONARY ANGIOPLASTY WITH STENT PLACEMENT  06/2011   "1"  . CORONARY ARTERY BYPASS GRAFT  2005   CABG X 2  . DILATION AND CURETTAGE OF UTERUS     "more than once"  . ELBOW ARTHROSCOPY Left 07/02/2015   Procedure: ARTHROSCOPY LEFT ELBOW WITH DEBRIDEMENT AND REMOVAL LOOSE BODY;  Surgeon: Frederik Pear, MD;  Location: [place];  Service: Orthopedics;  Laterality: Left;  . ESOPHAGEAL MANOMETRY N/A 12/29/2015   Procedure: ESOPHAGEAL MANOMETRY (EM);  Surgeon: Manus Gunning, MD;  Location: [facility];  Service: Gastroenterology;  Laterality: N/A;  . ESOPHAGEAL MANOMETRY N/A 07/30/2019   Procedure: ESOPHAGEAL MANOMETRY (EM);  Surgeon: Yetta Flock, MD;  Location: [facility];  Service: Gastroenterology;  Laterality: N/A;  . FRACTURE SURGERY  ~ 2005   nose  . KNEE  ARTHROSCOPY  09/04/2012   Procedure: ARTHROSCOPY KNEE;  Surgeon: Gearlean Alf, MD;  Location: [facility];  Service: Orthopedics;  Laterality: Right;  right knee arthroscopy with medial and lateral meniscus debridement  . 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 MOUTH SURGERY  2004   "bone replacement; had cadavear bones put in; face was collapsing"  . NASAL SEPTUM SURGERY  ~ 1986  . TOTAL KNEE ARTHROPLASTY Right 07/07/2013   Procedure: RIGHT TOTAL KNEE ARTHROPLASTY;  Surgeon: Gearlean Alf, MD;  Location: [facility];  Service: Orthopedics;  Laterality: Right;    There were no vitals filed for this visit.  Subjective Assessment - 09/22/19 0849    Subjective  Patient reported that she is doing okay today. She reports that she had another fall on Friday, stating that she fell on her right arm.  She  reports an increase in shoulder pain, as well as numbness and tingling on the posterior side of RUE that goes just past her wrist.    Pertinent History  On 08/03/18 pt was carrying a folding table and fell and right arm was under the handle got stuck between the door and the table. She landed on her R side. Tried to get up and had to pull at her arm. Pt had immediate pain in her right shoulder. She waited until Monday and went to see Dr. Edilia Bo her PCP. He took radiographs but didn't find any fractures. She continued to have pain so she called Dr. Damita Dunnings office, her orthopedist, and saw him that same week. He took additional radiographs and found a hairline fracture in her humeral shaft and a greater tuberosity fracture per patient report. She states that she is not healing well and has recurrent plain film radiographs. She is still wearing a sling and was encouraged not to perform any AROM of her right shoulder. Since her last visit with Dr. Mayer Camel on 09/20/18 she has had an additional fall and landed on her R shoulder. She reports additional bruising and pain in her R shoulder. She has a follow-up appt scheduled with her orthopedist for 10/15/18. 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 ROS negative for red flags. 06/05/19: Pt presents to physical therapy with reports of intermittent RUE numbness and tingling that began about 4 weeks ago. The numbness and tingling begins at the shoulder and extends down into the elbow. On days when it is more intense, it begins in the cervical spine. After about 5 seconds, numbness tingling subsides, and she experiences min hyperalgesia. Pt was told by physician that if numbness and tingling does not improve with conservative therapy after 3 months an MRI will be ordered. She is currently being seen in physical therapy for R shoulder pain due to recovery post greater tubercle fracture.    Limitations  Lifting    Diagnostic tests  Plain film  radiographs with decreased healing per pt report    Patient Stated Goals  Improve ability to use RUE and decrease pain    Currently in Pain?  Yes    Pain Score  4     Pain Location  Shoulder    Pain Orientation  Right    Pain Descriptors / Indicators  Sore;Aching    Pain Type  Chronic pain    Pain Onset  More than a month ago         TREATMENT   Therapeutic Exercise UBE 35mn forward/271m backward for  warm-up during history; RShoulder flexion with 3# DB x10 with significant compensations for the last 5 reps; dropped down to 2# DB for 2nd set of x10. R Scap lift offs 2# DB 2x10 RAbduction with2# DB 2x10 Sidelying R ER 2x10 with 2# DB Standing R shoulder extension 2x10 green band  Standing rows with green band 2x12 with moderate verbal and tactile cueing for form    Manual Therapy Radial nerve glides x15  STM to R upper trap, R upper cervical paraspinals, R scalenes, and R levator scapula Supine stretching 3x30s for each of the following: cervical flexion, R cervical side bending, R cervical rotation   Pt educated throughout session about proper posture and technique with exercises. Improved exercise technique, movement at target joints, use of target muscles after min to mod verbal cues.   Pt demonstrates good motivation throughout today's session.  She demonstrates better technique today with shoulder flexion and shoulder abduction with decreased weight.  Pt benefits from external cueing for technique and counting for all shoulder exercises.  She reports a relief of muscle tension at the end of the session. Pt will benefit from PT services to address deficits in strength, mobility, and pain in order to return to full function at home.                        PT Short Term Goals - 09/09/19 1137      PT SHORT TERM GOAL #1   Title  Pt will be independent with HEP in order to improve strength and decrease pain in order to improve pain-free  function at home.    Time  4    Period  Weeks    Status  Achieved        PT Long Term Goals - 09/09/19 1137      PT LONG TERM GOAL #1   Title  Pt will decrease quick DASH score to below 30% in order to demonstrate clinically significant reduction in disability.    Baseline  09/30/18: 84.1%; 10/30/18: 56.8%; 11/27/18: 45.45%; 12/23/18: 40.9%; 01/21/19: 29.5%, 6/4: 43%; 05/22/19: 43.2%; 08/05/19: 25%    Time  8    Period  Weeks    Status  Achieved      PT LONG TERM GOAL #2   Title  Pt will decrease worst shoulder pain as reported on NPRS to below 4/10 in order to demonstrate clinically significant reduction in pain.    Baseline  09/30/18: worst: 9/10; 10/30/18: 6/10; 11/27/18: 4/10; 12/23/18: 5/10 (pain is less frequent); 01/21/19: 5/10 (pain is less frequent); 03/19/19: 4/10; 6/4: 5/10; 05/22/19: 4/10 (decreased frequency of pain as well); 08/05/19: 2/10    Time  8    Period  Weeks    Status  Achieved      PT LONG TERM GOAL #3   Title  Pt will improve PROM of R shoulder to within 10 degrees of R shoulder in order to improve functional use of R shoulder for ADLs/IADLs.    Baseline  R/L 129/150 Shoulder flexion, 100/165 Shoulder abduction, 50/93 Shoulder external rotation, 50/70 Shoulder internal rotation; 10/30/18: R/L 140/150 Shoulder flexion, 132/165 Shoulder abduction, 82/93 Shoulder external rotation, 70/70 Shoulder internal rotation; 12/23/18:  R/L 155/150 Shoulder flexion, 134/165 Shoulder abduction, 89/93 Shoulder external rotation, 70/70 Shoulder internal rotation, 01/21/19: R/L 154/150 Shoulder flexion, 136/165 Shoulder abduction, 85/93 Shoulder external rotation, 70/70 Shoulder internal rotation; 03/19/19: Unable to test over telemedicine 6/4: R: 128 flexion, 121 abduction, 78 shoulder ER, 70  shoulder IR; 05/22/19: R: 148 flexion (154 L side), 145 abduction (166 L side), 85 shoulder ER, 76 shoulder IR; 08/05/19: R flexion 150 deg (L 162) R abduction 165 (L 170); R ER 89; R IR 90; 09/09/19: R flexion 152  deg R abduction 160 (L can reach full end range with PROM)    Time  8    Period  Weeks    Status  Partially Met    Target Date  09/30/19      PT LONG TERM GOAL #4   Title  Pt will improve pain-free R shoulder strength for flexion and abduction so that it is symmetrical with L shoulder in order to return to full function at home    Baseline  12/23/18: R/L 4-/4+ Shoulder flexion, 4/4+ Shoulder abduction, both painful on R side; 01/21/19: R/L 4/4+ Shoulder flexion, 4/4+ Shoulder abduction, both painful on R side; 03/19/19: Unable to test over telemedicine 6/4: 4-/4; 05/22/19: R/L: Flexion: 4/4+ (no pain bilateral) Abduction: 4-/4 (painful on R side, no pain on L side); 08/05/19: R/L flexion 4/5 (some pain on R), R/L abduction 4+/5 but no pain; 09/09/19: R/L flexion 4+/4+, R/L abduction 4+/4+    Time  8    Period  Weeks    Status  Achieved    Target Date  09/30/19      PT LONG TERM GOAL #5   Title  Pt will demonstrate decrease in NDI by at least 19% in order to demonstrate clinically significant reduction in disability related to neck injury/pain    Baseline  06/05/19: 36%; 08/05/19: 8%    Time  8    Period  Weeks    Status  Achieved            Plan - 09/22/19 0947    Clinical Impression Statement  Pt demonstrates good motivation throughout today's session.  No pain reported with AROM flexion and abduction of R shoulder. No observable bruising or swelling to R upper arm. She demonstrates better technique today with shoulder flexion and shoulder abduction with decreased weight.  Pt benefits from external cueing for technique and counting for all shoulder exercises.  She reports a relief of muscle tension at the end of the session.  Pt will benefit from PT services to address deficits in strength, mobility, and pain in order to return to full function at home.    Personal Factors and Comorbidities  Age;Comorbidity 2;Past/Current Experience    Comorbidities  afib, CAD, fibromyalgia, DM, depression     Examination-Activity Limitations  Bathing;Carry    Examination-Participation Restrictions  Cleaning;Community Activity    Rehab Potential  Good    PT Frequency  2x / week    PT Duration  8 weeks    PT Treatment/Interventions  ADLs/Self Care Home Management;Aquatic Therapy;Canalith Repostioning;Cryotherapy;Electrical Stimulation;Iontophoresis '4mg'$ /ml Dexamethasone;Moist Heat;Traction;Ultrasound;Contrast Bath;DME Instruction;Gait training;Stair training;Functional mobility training;Therapeutic activities;Therapeutic exercise;Balance training;Neuromuscular re-education;Patient/family education;Manual techniques;Passive range of motion;Dry needling;Splinting;Taping;Vestibular;Spinal Manipulations    PT Next Visit Plan  TDN, reassess trigger points in scapular region. progress shoulder exercises.  cervical retractions, deep neck flexor exercises, nerve glides, cervical traction, cervical mobilizations    PT Home Exercise Plan  Cervical retractions, Scapular retractions, R upper trap stretch, supine canes for AAROM flexion with hands together progressing to AROM supine flexion, AROM sidelying shoulder abduction, AROM sidelying shoulder ER, seated pulleys for flexion and scaption ([id number])       Patient will benefit from skilled therapeutic intervention in order to improve the following deficits and impairments:  Decreased strength,  Decreased range of motion, Impaired UE functional use, Pain  Visit Diagnosis: Radiculopathy, cervical region  Stiffness of right shoulder, not elsewhere classified  Muscle weakness (generalized)     Problem List Patient Active Problem List   Diagnosis Date Noted  . Genetic testing 08/25/2019  . Dysphagia   . Globus sensation   . Family history of breast cancer   . History of colon polyps   . Chronic diarrhea 01/14/2019  . Malignant melanoma ([place]) 11/23/2016  . Urge incontinence 11/23/2016  . Atypical mole 09/22/2016  . Slurring of speech 05/12/2016  .  Traumatic arthritis elbow 05/04/2015  . Left foot pain 12/31/2014  . Severe sprain of left ankle 12/31/2014  . Osteopenia 09/15/2014  . History of TIA (transient ischemic attack) 05/19/2014  . Fracture of coronoid process of ulna, left, closed 04/25/2014  . Ingrown toenail 01/15/2014  . Obesity (BMI 30-39.9) 10/08/2013  . OA (osteoarthritis) of knee 07/07/2013  . Chronic low back pain 05/09/2013  . Obstructive sleep apnea 05/09/2013  . GERD (gastroesophageal reflux disease) 05/09/2013  . Hiatal hernia 05/09/2013  . Fibromyalgia syndrome 05/09/2013  . Depression, major, in partial remission ([place]) 05/09/2013  . RLS (restless legs syndrome) 05/09/2013  . Diabetes mellitus with no complication ([place]) 22/44/9753  . High cholesterol 05/09/2013  . 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 Family history of colon cancer 05/09/2013  . Allergic rhinitis 05/09/2013  . Lateral meniscal tear 09/04/2012  . CAD (coronary artery disease) 09/27/2011  . HTN (hypertension) 09/27/2011  . Paroxysmal A-fib ([place]) 09/27/2011   This entire session was performed under direct supervision and direction of a licensed therapist/therapist assistant . I have personally read, edited and approve of the note as written.   Lutricia Horsfall, SPT [facility] Grout PT, DPT, GCS  Huprich,Jason 09/22/2019, 1:23 PM  [place] MAIN [facility] SERVICES [address] [place], [place], [address] Phone: [phone]   Fax:  [phone]  Name: Morgan [name] MRN: [id number] Date of Birth: 1941/04/02

## 2019-09-24 ENCOUNTER — Ambulatory Visit: Payer: Medicare Other

## 2019-09-24 ENCOUNTER — Other Ambulatory Visit: Payer: Self-pay

## 2019-09-24 DIAGNOSIS — M5412 Radiculopathy, cervical region: Secondary | ICD-10-CM | POA: Diagnosis not present

## 2019-09-24 DIAGNOSIS — M6281 Muscle weakness (generalized): Secondary | ICD-10-CM

## 2019-09-24 DIAGNOSIS — M25611 Stiffness of right shoulder, not elsewhere classified: Secondary | ICD-10-CM

## 2019-09-24 NOTE — Therapy (Signed)
[place] MAIN [facility] SERVICES [address] [place], [place], [address] Phone: [phone]   Fax:  [phone]  Physical Therapy Treatment  Patient Details  Name: Morgan [name] MRN: [id number] Date of Birth: 1941-08-03 Referring Provider (PT): Dr. Mayer Camel   Encounter Date: 09/24/2019  PT End of Session - 09/24/19 1034    Visit Number  28    Number of Visits  81    Date for PT Re-Evaluation  09/30/19    Authorization Type  09/09/19    PT Start Time  1034    PT Stop Time  1115    PT Time Calculation (min)  41 min    Activity Tolerance  Patient tolerated treatment well    Behavior During Therapy  [facility] for tasks assessed/performed       Past Medical History:  Diagnosis Date  . Angina   . Arthritis   . Atrial fibrillation (HCC)    ASPIRIN FOR BLOOD THINNER  . Atypical mole 09/22/2016  . Bulging discs    lumbar   . Cancer ([place])    melanoma  . Complication of anesthesia    pt states has choking sensation with ET tube   . Coronary artery disease   . Depression   . Diabetes mellitus    diet controlled/on meds  . Family history of breast cancer   . Family history of colon cancer   . Fibromyalgia   . GERD (gastroesophageal reflux disease)   . H/O hiatal hernia   . Headache(784.0)    "recurring"  . High cholesterol   . History of colon polyps   . Hyperlipidemia   . Hypertension   . 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 Migraines    "til ~ 1980"  . PONV (postoperative nausea and vomiting)   . Restless leg syndrome   . Sciatic nerve pain    "from pinched nerve"  . Sleep apnea    uses CPAP  . Stroke [facility]) 2014   no deficits  . Weakness of right side of body    "I've had PT for it; they don't know what it's from".  CORTISONE INJECTION INTO BACK 08/30/12    Past Surgical History:  Procedure Laterality Date  . [place] STUDY N/A 12/29/2015   Procedure: [place] STUDY;  Surgeon: Manus Gunning, MD;  Location: [facility];   Service: Gastroenterology;  Laterality: N/A;  . CARPAL TUNNEL RELEASE Left 07/02/2015   Procedure: CARPAL TUNNEL RELEASE;  Surgeon: Frederik Pear, MD;  Location: [place];  Service: Orthopedics;  Laterality: Left;  . CATARACT EXTRACTION, BILATERAL Bilateral    Oct and Nov 2017  . CORONARY ANGIOPLASTY WITH STENT PLACEMENT  06/2011   "1"  . CORONARY ARTERY BYPASS GRAFT  2005   CABG X 2  . DILATION AND CURETTAGE OF UTERUS     "more than once"  . ELBOW ARTHROSCOPY Left 07/02/2015   Procedure: ARTHROSCOPY LEFT ELBOW WITH DEBRIDEMENT AND REMOVAL LOOSE BODY;  Surgeon: Frederik Pear, MD;  Location: [place];  Service: Orthopedics;  Laterality: Left;  . ESOPHAGEAL MANOMETRY N/A 12/29/2015   Procedure: ESOPHAGEAL MANOMETRY (EM);  Surgeon: Manus Gunning, MD;  Location: [facility];  Service: Gastroenterology;  Laterality: N/A;  . ESOPHAGEAL MANOMETRY N/A 07/30/2019   Procedure: ESOPHAGEAL MANOMETRY (EM);  Surgeon: Yetta Flock, MD;  Location: [facility];  Service: Gastroenterology;  Laterality: N/A;  . FRACTURE SURGERY  ~ 2005   nose  . KNEE  ARTHROSCOPY  09/04/2012   Procedure: ARTHROSCOPY KNEE;  Surgeon: Gearlean Alf, MD;  Location: [facility];  Service: Orthopedics;  Laterality: Right;  right knee arthroscopy with medial and lateral meniscus debridement  . 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 MOUTH SURGERY  2004   "bone replacement; had cadavear bones put in; face was collapsing"  . NASAL SEPTUM SURGERY  ~ 1986  . TOTAL KNEE ARTHROPLASTY Right 07/07/2013   Procedure: RIGHT TOTAL KNEE ARTHROPLASTY;  Surgeon: Gearlean Alf, MD;  Location: [facility];  Service: Orthopedics;  Laterality: Right;    There were no vitals filed for this visit.  Subjective Assessment - 09/24/19 1037    Subjective  Patient reported that she is doing okay today. She reports that her numbness and tingling is occuring less often, but that it is still going  down her arm.    Pertinent History  On 08/03/18 pt was carrying a folding table and fell and right arm was under the handle got stuck between the door and the table. She landed on her R side. Tried to get up and had to pull at her arm. Pt had immediate pain in her right shoulder. She waited until Monday and went to see Dr. Edilia Bo her PCP. He took radiographs but didn't find any fractures. She continued to have pain so she called Dr. Damita Dunnings office, her orthopedist, and saw him that same week. He took additional radiographs and found a hairline fracture in her humeral shaft and a greater tuberosity fracture per patient report. She states that she is not healing well and has recurrent plain film radiographs. She is still wearing a sling and was encouraged not to perform any AROM of her right shoulder. Since her last visit with Dr. Mayer Camel on 09/20/18 she has had an additional fall and landed on her R shoulder. She reports additional bruising and pain in her R shoulder. She has a follow-up appt scheduled with her orthopedist for 10/15/18. 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 ROS negative for red flags. 06/05/19: Pt presents to physical therapy with reports of intermittent RUE numbness and tingling that began about 4 weeks ago. The numbness and tingling begins at the shoulder and extends down into the elbow. On days when it is more intense, it begins in the cervical spine. After about 5 seconds, numbness tingling subsides, and she experiences min hyperalgesia. Pt was told by physician that if numbness and tingling does not improve with conservative therapy after 3 months an MRI will be ordered. She is currently being seen in physical therapy for R shoulder pain due to recovery post greater tubercle fracture.    Limitations  Lifting    Diagnostic tests  Plain film radiographs with decreased healing per pt report    Patient Stated Goals  Improve ability to use RUE and decrease pain     Currently in Pain?  Yes    Pain Score  2     Pain Location  Shoulder    Pain Orientation  Right    Pain Descriptors / Indicators  Sore;Aching    Pain Type  Chronic pain    Pain Onset  More than a month ago    Pain Frequency  Intermittent         TREATMENT   Therapeutic Exercise UBE 13mn forward/278m backward for warm-up during history; RShoulder flexion with 2# DB 2x10 R shoulder abuction with 2# DB 2x10  R Scap lift offs 2# DB 2x10 Sidelying R ER 2x10 with 2# DB Standing R shoulder extension 2x10 green band  Standing rows with green band 2x12 with moderate verbal and tactile cueing for form    Manual Therapy Radial nerve glides x15  STM toRupper trap, Ruppercervical paraspinals, R scalenes, and R levator scapula Supine stretching 3x30s for each of the following: cervical flexion, R cervical side bending, R cervical rotation   Pt educated throughout session about proper posture and technique with exercises. Improved exercise technique, movement at target joints, use of target muscles after min to mod verbal cues.   Pt demonstrates good motivation throughout today's session.  Pt reports a decrease in numbness and tingling episodes today, reporting that she had a decrease in muscle tension after last session as well.  She continues to benefit from external cueing for proper technique and counting for all shoulder exercises.  She reports a relief in muscle tension following STM, stretching, and nerve glides today. Pt will benefit from PT services to address deficits in strength, mobility, and pain in order to return to full function at home.                        PT Short Term Goals - 09/09/19 1137      PT SHORT TERM GOAL #1   Title  Pt will be independent with HEP in order to improve strength and decrease pain in order to improve pain-free function at home.    Time  4    Period  Weeks    Status  Achieved        PT Long Term  Goals - 09/09/19 1137      PT LONG TERM GOAL #1   Title  Pt will decrease quick DASH score to below 30% in order to demonstrate clinically significant reduction in disability.    Baseline  09/30/18: 84.1%; 10/30/18: 56.8%; 11/27/18: 45.45%; 12/23/18: 40.9%; 01/21/19: 29.5%, 6/4: 43%; 05/22/19: 43.2%; 08/05/19: 25%    Time  8    Period  Weeks    Status  Achieved      PT LONG TERM GOAL #2   Title  Pt will decrease worst shoulder pain as reported on NPRS to below 4/10 in order to demonstrate clinically significant reduction in pain.    Baseline  09/30/18: worst: 9/10; 10/30/18: 6/10; 11/27/18: 4/10; 12/23/18: 5/10 (pain is less frequent); 01/21/19: 5/10 (pain is less frequent); 03/19/19: 4/10; 6/4: 5/10; 05/22/19: 4/10 (decreased frequency of pain as well); 08/05/19: 2/10    Time  8    Period  Weeks    Status  Achieved      PT LONG TERM GOAL #3   Title  Pt will improve PROM of R shoulder to within 10 degrees of R shoulder in order to improve functional use of R shoulder for ADLs/IADLs.    Baseline  R/L 129/150 Shoulder flexion, 100/165 Shoulder abduction, 50/93 Shoulder external rotation, 50/70 Shoulder internal rotation; 10/30/18: R/L 140/150 Shoulder flexion, 132/165 Shoulder abduction, 82/93 Shoulder external rotation, 70/70 Shoulder internal rotation; 12/23/18:  R/L 155/150 Shoulder flexion, 134/165 Shoulder abduction, 89/93 Shoulder external rotation, 70/70 Shoulder internal rotation, 01/21/19: R/L 154/150 Shoulder flexion, 136/165 Shoulder abduction, 85/93 Shoulder external rotation, 70/70 Shoulder internal rotation; 03/19/19: Unable to test over telemedicine 6/4: R: 128 flexion, 121 abduction, 78 shoulder ER, 70 shoulder IR; 05/22/19: R: 148 flexion (154 L side), 145 abduction (166 L side), 85 shoulder ER, 76 shoulder IR; 08/05/19:  R flexion 150 deg (L 162) R abduction 165 (L 170); R ER 89; R IR 90; 09/09/19: R flexion 152 deg R abduction 160 (L can reach full end range with PROM)    Time  8    Period  Weeks     Status  Partially Met    Target Date  09/30/19      PT LONG TERM GOAL #4   Title  Pt will improve pain-free R shoulder strength for flexion and abduction so that it is symmetrical with L shoulder in order to return to full function at home    Baseline  12/23/18: R/L 4-/4+ Shoulder flexion, 4/4+ Shoulder abduction, both painful on R side; 01/21/19: R/L 4/4+ Shoulder flexion, 4/4+ Shoulder abduction, both painful on R side; 03/19/19: Unable to test over telemedicine 6/4: 4-/4; 05/22/19: R/L: Flexion: 4/4+ (no pain bilateral) Abduction: 4-/4 (painful on R side, no pain on L side); 08/05/19: R/L flexion 4/5 (some pain on R), R/L abduction 4+/5 but no pain; 09/09/19: R/L flexion 4+/4+, R/L abduction 4+/4+    Time  8    Period  Weeks    Status  Achieved    Target Date  09/30/19      PT LONG TERM GOAL #5   Title  Pt will demonstrate decrease in NDI by at least 19% in order to demonstrate clinically significant reduction in disability related to neck injury/pain    Baseline  06/05/19: 36%; 08/05/19: 8%    Time  8    Period  Weeks    Status  Achieved            Plan - 09/24/19 1211    Clinical Impression Statement  Pt demonstrates good motivation throughout today's session.  Pt reports a decrease in numbness and tingling episodes today, reporting that she had a decrease in muscle tension after last session as well.  She continues to benefit from external cueing for proper technique and counting for all shoulder exercises.  She reports a relief in muscle tension following STM, stretching, and nerve glides today.  Pt will benefit from PT services to address deficits in strength, mobility, and pain in order to return to full function at home.    Personal Factors and Comorbidities  Age;Comorbidity 2;Past/Current Experience    Comorbidities  afib, CAD, fibromyalgia, DM, depression    Examination-Activity Limitations  Bathing;Carry    Examination-Participation Restrictions  Cleaning;Community Activity     Rehab Potential  Good    PT Frequency  2x / week    PT Duration  8 weeks    PT Treatment/Interventions  ADLs/Self Care Home Management;Aquatic Therapy;Canalith Repostioning;Cryotherapy;Electrical Stimulation;Iontophoresis 43m/ml Dexamethasone;Moist Heat;Traction;Ultrasound;Contrast Bath;DME Instruction;Gait training;Stair training;Functional mobility training;Therapeutic activities;Therapeutic exercise;Balance training;Neuromuscular re-education;Patient/family education;Manual techniques;Passive range of motion;Dry needling;Splinting;Taping;Vestibular;Spinal Manipulations    PT Next Visit Plan  TDN, reassess trigger points in scapular region. progress shoulder exercises.  cervical retractions, deep neck flexor exercises, nerve glides, cervical traction, cervical mobilizations    PT Home Exercise Plan  Cervical retractions, Scapular retractions, R upper trap stretch, supine canes for AAROM flexion with hands together progressing to AROM supine flexion, AROM sidelying shoulder abduction, AROM sidelying shoulder ER, seated pulleys for flexion and scaption ([id number])       Patient will benefit from skilled therapeutic intervention in order to improve the following deficits and impairments:  Decreased strength, Decreased range of motion, Impaired UE functional use, Pain  Visit Diagnosis: Radiculopathy, cervical region  Stiffness of right shoulder, not elsewhere classified  Muscle weakness (generalized)  Problem List Patient Active Problem List   Diagnosis Date Noted  . Genetic testing 08/25/2019  . Dysphagia   . Globus sensation   . Family history of breast cancer   . History of colon polyps   . Chronic diarrhea 01/14/2019  . Malignant melanoma ([place]) 11/23/2016  . Urge incontinence 11/23/2016  . Atypical mole 09/22/2016  . Slurring of speech 05/12/2016  . Traumatic arthritis elbow 05/04/2015  . Left foot pain 12/31/2014  . Severe sprain of left ankle 12/31/2014  . Osteopenia  09/15/2014  . History of TIA (transient ischemic attack) 05/19/2014  . Fracture of coronoid process of ulna, left, closed 04/25/2014  . Ingrown toenail 01/15/2014  . Obesity (BMI 30-39.9) 10/08/2013  . OA (osteoarthritis) of knee 07/07/2013  . Chronic low back pain 05/09/2013  . Obstructive sleep apnea 05/09/2013  . GERD (gastroesophageal reflux disease) 05/09/2013  . Hiatal hernia 05/09/2013  . Fibromyalgia syndrome 05/09/2013  . Depression, major, in partial remission ([place]) 05/09/2013  . RLS (restless legs syndrome) 05/09/2013  . Diabetes mellitus with no complication ([place]) 49/70/2637  . High cholesterol 05/09/2013  . 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 Family history of colon cancer 05/09/2013  . Allergic rhinitis 05/09/2013  . Lateral meniscal tear 09/04/2012  . CAD (coronary artery disease) 09/27/2011  . HTN (hypertension) 09/27/2011  . Paroxysmal A-fib ([place]) 09/27/2011    This entire session was performed under direct supervision and direction of a licensed therapist/therapist assistant . I have personally read, edited and approve of the note as written.   Lutricia Horsfall, SPT [facility] PT, DPT, GCS  Huprich,Jason 09/24/2019, 1:49 PM  [place] MAIN [facility] SERVICES [address] [place], [place], [address] Phone: [phone]   Fax:  [phone]  Name: Morgan [name] MRN: [id number] Date of Birth: 11-25-1940

## 2019-10-01 ENCOUNTER — Other Ambulatory Visit: Payer: Self-pay

## 2019-10-01 ENCOUNTER — Ambulatory Visit: Payer: Medicare Other

## 2019-10-01 DIAGNOSIS — M5412 Radiculopathy, cervical region: Secondary | ICD-10-CM | POA: Diagnosis not present

## 2019-10-01 DIAGNOSIS — M6281 Muscle weakness (generalized): Secondary | ICD-10-CM

## 2019-10-01 DIAGNOSIS — M25611 Stiffness of right shoulder, not elsewhere classified: Secondary | ICD-10-CM

## 2019-10-01 NOTE — Therapy (Signed)
[place] MAIN [facility] SERVICES [address] [place], [place], [address] Phone: [phone]   Fax:  [phone]  Physical Therapy Recertification  Patient Details  Name: Morgan [name] MRN: [id number] Date of Birth: [birth year] No data recorded  Encounter Date: 10/01/2019  PT End of Session - 10/01/19 1305    Visit Number  65    Number of Visits  93    Date for PT Re-Evaluation  12/24/19    Authorization Type  --    PT Start Time  1306    PT Stop Time  1345    PT Time Calculation (min)  39 min    Activity Tolerance  Patient tolerated treatment well    Behavior During Therapy  [facility] for tasks assessed/performed       Past Medical History:  Diagnosis Date  . Angina   . Arthritis   . Atrial fibrillation (HCC)    ASPIRIN FOR BLOOD THINNER  . Atypical mole 09/22/2016  . Bulging discs    lumbar   . Cancer ([place])    melanoma  . Complication of anesthesia    pt states has choking sensation with ET tube   . Coronary artery disease   . Depression   . Diabetes mellitus    diet controlled/on meds  . Family history of breast cancer   . Family history of colon cancer   . Fibromyalgia   . GERD (gastroesophageal reflux disease)   . H/O hiatal hernia   . Headache(784.0)    "recurring"  . High cholesterol   . History of colon polyps   . Hyperlipidemia   . Hypertension   . 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 Migraines    "til ~ 1980"  . PONV (postoperative nausea and vomiting)   . Restless leg syndrome   . Sciatic nerve pain    "from pinched nerve"  . Sleep apnea    uses CPAP  . Stroke [facility]) 2014   no deficits  . Weakness of right side of body    "I've had PT for it; they don't know what it's from".  CORTISONE INJECTION INTO BACK 08/30/12    Past Surgical History:  Procedure Laterality Date  . [place] STUDY N/A 12/29/2015   Procedure: [place] STUDY;  Surgeon: Manus Gunning, MD;  Location: [facility];  Service:  Gastroenterology;  Laterality: N/A;  . CARPAL TUNNEL RELEASE Left 07/02/2015   Procedure: CARPAL TUNNEL RELEASE;  Surgeon: Frederik Pear, MD;  Location: [place];  Service: Orthopedics;  Laterality: Left;  . CATARACT EXTRACTION, BILATERAL Bilateral    Oct and Nov 2017  . CORONARY ANGIOPLASTY WITH STENT PLACEMENT  06/2011   "1"  . CORONARY ARTERY BYPASS GRAFT  2005   CABG X 2  . DILATION AND CURETTAGE OF UTERUS     "more than once"  . ELBOW ARTHROSCOPY Left 07/02/2015   Procedure: ARTHROSCOPY LEFT ELBOW WITH DEBRIDEMENT AND REMOVAL LOOSE BODY;  Surgeon: Frederik Pear, MD;  Location: [place];  Service: Orthopedics;  Laterality: Left;  . ESOPHAGEAL MANOMETRY N/A 12/29/2015   Procedure: ESOPHAGEAL MANOMETRY (EM);  Surgeon: Manus Gunning, MD;  Location: [facility];  Service: Gastroenterology;  Laterality: N/A;  . ESOPHAGEAL MANOMETRY N/A 07/30/2019   Procedure: ESOPHAGEAL MANOMETRY (EM);  Surgeon: Yetta Flock, MD;  Location: [facility];  Service: Gastroenterology;  Laterality: N/A;  . FRACTURE SURGERY  ~ 2005   nose  . KNEE ARTHROSCOPY  09/04/2012  Procedure: ARTHROSCOPY KNEE;  Surgeon: Gearlean Alf, MD;  Location: [facility];  Service: Orthopedics;  Laterality: Right;  right knee arthroscopy with medial and lateral meniscus debridement  . 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 MOUTH SURGERY  2004   "bone replacement; had cadavear bones put in; face was collapsing"  . NASAL SEPTUM SURGERY  ~ 1986  . TOTAL KNEE ARTHROPLASTY Right 07/07/2013   Procedure: RIGHT TOTAL KNEE ARTHROPLASTY;  Surgeon: Gearlean Alf, MD;  Location: [facility];  Service: Orthopedics;  Laterality: Right;    There were no vitals filed for this visit.  Subjective Assessment - 10/01/19 1307    Subjective  Patient reported that she is doing okay today. She had a close family friend pass away earlier in the week which is why she had to miss her appointment.  She denies any pain today but continues to report intermittent numbness and tingling down her RUE to her hand.    Pertinent History  On 08/03/18 pt was carrying a folding table and fell and right arm was under the handle got stuck between the door and the table. She landed on her R side. Tried to get up and had to pull at her arm. Pt had immediate pain in her right shoulder. She waited until Monday and went to see Dr. Edilia Bo her PCP. He took radiographs but didn't find any fractures. She continued to have pain so she called Dr. Damita Dunnings office, her orthopedist, and saw him that same week. He took additional radiographs and found a hairline fracture in her humeral shaft and a greater tuberosity fracture per patient report. She states that she is not healing well and has recurrent plain film radiographs. She is still wearing a sling and was encouraged not to perform any AROM of her right shoulder. Since her last visit with Dr. Mayer Camel on 09/20/18 she has had an additional fall and landed on her R shoulder. She reports additional bruising and pain in her R shoulder. She has a follow-up appt scheduled with her orthopedist for 10/15/18. 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 ROS negative for red flags. 06/05/19: Pt presents to physical therapy with reports of intermittent RUE numbness and tingling that began about 4 weeks ago. The numbness and tingling begins at the shoulder and extends down into the elbow. On days when it is more intense, it begins in the cervical spine. After about 5 seconds, numbness tingling subsides, and she experiences min hyperalgesia. Pt was told by physician that if numbness and tingling does not improve with conservative therapy after 3 months an MRI will be ordered. She is currently being seen in physical therapy for R shoulder pain due to recovery post greater tubercle fracture.    Limitations  Lifting    Diagnostic tests  Plain film radiographs with decreased  healing per pt report    Patient Stated Goals  Improve ability to use RUE and decrease pain    Currently in Pain?  No/denies    Effect of Pain on Daily Activities  S          TREATMENT   Therapeutic Exercise UBE 47mn forward/265m backward for warm-up during history; RShoulder flexion with 2# DB 2x10 R shoulder abuction with 2# DB 2x10 R Scap lift offs2# DB2x10  PROM: R flexion 160 deg, R abduction 151deg; painful at end of range   Trigger Point Dry Needling (TDN), unbilled Education performed with  patient regarding potential benefit of TDN. Extensive time spent with pt to ensure full understanding of TDN risks. Pt provided verbal consent to treatment. TDN performed to R upper trap with 2, 0.30 x 50 single needle placements with local twitch response (LTR). Pistoning technique utilized. Improved pain-free motion following intervention.    Electrical Stimulation Combination US/HiVolt applied toLupper trap and L levator scapula1.0 W/cm2, 1.0 Mhz, at pt tolerated intensity of 135V x 8 minutes, visible upper trapand levator scapulacontractions observed and pain free cervical rotation noted following;     Pt educated throughout session about proper posture and technique with exercises. Improved exercise technique, movement at target joints, use of target muscles after min to mod verbal cues.   Pt demonstrates good motivation throughout her PT sessions.  She has met all of her STGs and has met 4 out of 5 of her LTGs.  She was able to reach 160deg of PROM in shoulder flexion today.  She continues to report numbness and tingling of varying degrees throughout her sessions, with mild relief during sessions with TDN, nerve glides, and STM, however it has not fully resolved.  She continues to benefit from tactile and verbal cueing throughout her shoulder exercises to assist with proper technique. She reports a relief in muscle tension following STM, stretching, and nerve  glides today. Pt will benefit from PT services to address deficits in strength, mobility, and pain in order to return to full function at home.                        PT Short Term Goals - 10/01/19 1454      PT SHORT TERM GOAL #1   Title  Pt will be independent with HEP in order to improve strength and decrease pain in order to improve pain-free function at home.    Time  4    Period  Weeks    Status  Achieved        PT Long Term Goals - 10/01/19 1454      PT LONG TERM GOAL #1   Title  Pt will decrease quick DASH score to below 30% in order to demonstrate clinically significant reduction in disability.    Baseline  09/30/18: 84.1%; 10/30/18: 56.8%; 11/27/18: 45.45%; 12/23/18: 40.9%; 01/21/19: 29.5%, 6/4: 43%; 05/22/19: 43.2%; 08/05/19: 25%    Time  8    Period  Weeks    Status  Achieved      PT LONG TERM GOAL #2   Title  Pt will decrease worst shoulder pain as reported on NPRS to below 4/10 in order to demonstrate clinically significant reduction in pain.    Baseline  09/30/18: worst: 9/10; 10/30/18: 6/10; 11/27/18: 4/10; 12/23/18: 5/10 (pain is less frequent); 01/21/19: 5/10 (pain is less frequent); 03/19/19: 4/10; 6/4: 5/10; 05/22/19: 4/10 (decreased frequency of pain as well); 08/05/19: 2/10    Time  8    Period  Weeks    Status  Achieved      PT LONG TERM GOAL #3   Title  Pt will improve PROM of R shoulder to within 10 degrees of R shoulder in order to improve functional use of R shoulder for ADLs/IADLs.    Baseline  R/L 129/150 Shoulder flexion, 100/165 Shoulder abduction, 50/93 Shoulder external rotation, 50/70 Shoulder internal rotation; 10/30/18: R/L 140/150 Shoulder flexion, 132/165 Shoulder abduction, 82/93 Shoulder external rotation, 70/70 Shoulder internal rotation; 12/23/18:  R/L 155/150 Shoulder flexion, 134/165 Shoulder abduction, 89/93 Shoulder external rotation,  70/70 Shoulder internal rotation, 01/21/19: R/L 154/150 Shoulder flexion, 136/165 Shoulder abduction,  85/93 Shoulder external rotation, 70/70 Shoulder internal rotation; 03/19/19: Unable to test over telemedicine 6/4: R: 128 flexion, 121 abduction, 78 shoulder ER, 70 shoulder IR; 05/22/19: R: 148 flexion (154 L side), 145 abduction (166 L side), 85 shoulder ER, 76 shoulder IR; 08/05/19: R flexion 150 deg (L 162) R abduction 165 (L 170); R ER 89; R IR 90; 09/09/19: R flexion 152 deg R abduction 160 (L can reach full end range with PROM); 10/01/19: R flexion 160deg, R abduction 151deg, with pain at end of range.    Time  12    Period  Weeks    Status  Partially Met    Target Date  12/24/19      PT LONG TERM GOAL #4   Title  Pt will improve pain-free R shoulder strength for flexion and abduction so that it is symmetrical with L shoulder in order to return to full function at home    Baseline  12/23/18: R/L 4-/4+ Shoulder flexion, 4/4+ Shoulder abduction, both painful on R side; 01/21/19: R/L 4/4+ Shoulder flexion, 4/4+ Shoulder abduction, both painful on R side; 03/19/19: Unable to test over telemedicine 6/4: 4-/4; 05/22/19: R/L: Flexion: 4/4+ (no pain bilateral) Abduction: 4-/4 (painful on R side, no pain on L side); 08/05/19: R/L flexion 4/5 (some pain on R), R/L abduction 4+/5 but no pain; 09/09/19: R/L flexion 4+/4+, R/L abduction 4+/4+    Time  8    Period  Weeks    Status  Achieved      PT LONG TERM GOAL #5   Title  Pt will demonstrate decrease in NDI by at least 19% in order to demonstrate clinically significant reduction in disability related to neck injury/pain    Baseline  06/05/19: 36%; 08/05/19: 8%    Time  8    Period  Weeks    Status  Achieved            Plan - 10/01/19 1330    Clinical Impression Statement  Pt demonstrates good motivation throughout her PT sessions.  She has met all of her STGs and has met 4 out of 5 of her LTGs.  She was able to reach 160deg of PROM in shoulder flexion today.  She continues to report numbness and tingling of varying degrees throughout her sessions, with  mild relief during sessions with TDN, nerve glides, and STM, however it has not fully resolved.  She continues to benefit from tactile and verbal cueing throughout her shoulder exercises to assist with proper technique.  She reports a relief in muscle tension following STM, stretching, and nerve glides today.  Pt will benefit from PT services to address deficits in strength, mobility, and pain in order to return to full function at home.    Personal Factors and Comorbidities  Age;Comorbidity 2;Past/Current Experience    Comorbidities  afib, CAD, fibromyalgia, DM, depression    Examination-Activity Limitations  Bathing;Carry    Examination-Participation Restrictions  Cleaning;Community Activity    Rehab Potential  Good    PT Frequency  1x / week    PT Duration  12 weeks    PT Treatment/Interventions  ADLs/Self Care Home Management;Aquatic Therapy;Canalith Repostioning;Cryotherapy;Electrical Stimulation;Iontophoresis 65m/ml Dexamethasone;Moist Heat;Traction;Ultrasound;Contrast Bath;DME Instruction;Gait training;Stair training;Functional mobility training;Therapeutic activities;Therapeutic exercise;Balance training;Neuromuscular re-education;Patient/family education;Manual techniques;Passive range of motion;Dry needling;Splinting;Taping;Vestibular;Spinal Manipulations    PT Next Visit Plan  TDN, reassess trigger points in scapular region. progress shoulder exercises.  cervical retractions, deep neck flexor  exercises, nerve glides, cervical traction, cervical mobilizations    PT Home Exercise Plan  Cervical retractions, Scapular retractions, R upper trap stretch, supine canes for AAROM flexion with hands together progressing to AROM supine flexion, AROM sidelying shoulder abduction, AROM sidelying shoulder ER, seated pulleys for flexion and scaption ([id number])       Patient will benefit from skilled therapeutic intervention in order to improve the following deficits and impairments:  Decreased strength,  Decreased range of motion, Impaired UE functional use, Pain  Visit Diagnosis: Radiculopathy, cervical region  Stiffness of right shoulder, not elsewhere classified  Muscle weakness (generalized)     Problem List Patient Active Problem List   Diagnosis Date Noted  . Genetic testing 08/25/2019  . Dysphagia   . Globus sensation   . Family history of breast cancer   . History of colon polyps   . Chronic diarrhea 01/14/2019  . Malignant melanoma ([place]) 11/23/2016  . Urge incontinence 11/23/2016  . Atypical mole 09/22/2016  . Slurring of speech 05/12/2016  . Traumatic arthritis elbow 05/04/2015  . Left foot pain 12/31/2014  . Severe sprain of left ankle 12/31/2014  . Osteopenia 09/15/2014  . History of TIA (transient ischemic attack) 05/19/2014  . Fracture of coronoid process of ulna, left, closed 04/25/2014  . Ingrown toenail 01/15/2014  . Obesity (BMI 30-39.9) 10/08/2013  . OA (osteoarthritis) of knee 07/07/2013  . Chronic low back pain 05/09/2013  . Obstructive sleep apnea 05/09/2013  . GERD (gastroesophageal reflux disease) 05/09/2013  . Hiatal hernia 05/09/2013  . Fibromyalgia syndrome 05/09/2013  . Depression, major, in partial remission ([place]) 05/09/2013  . RLS (restless legs syndrome) 05/09/2013  . Diabetes mellitus with no complication ([place]) 88/50/2774  . High cholesterol 05/09/2013  . 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 Family history of colon cancer 05/09/2013  . Allergic rhinitis 05/09/2013  . Lateral meniscal tear 09/04/2012  . CAD (coronary artery disease) 09/27/2011  . HTN (hypertension) 09/27/2011  . Paroxysmal A-fib ([place]) 09/27/2011    This entire session was performed under direct supervision and direction of a licensed therapist/therapist assistant . I have personally read, edited and approve of the note as written.   Lutricia Horsfall, SPT Phillips Grout PT, DPT, GCS  Huprich,Jason 10/02/2019, 4:15 PM  [place]  MAIN [facility] SERVICES [address] [place], [place], [address] Phone: [phone]   Fax:  [phone]  Name: Morgan [name] MRN: [id number] Date of Birth: 1941/02/15

## 2019-10-02 ENCOUNTER — Other Ambulatory Visit: Payer: Self-pay | Admitting: Family Medicine

## 2019-10-02 NOTE — Telephone Encounter (Signed)
Last office visit 02/25/2019 for HTN.  Last refilled 08/26/2019 for #60 with no refills.  CPE scheduled for 01/27/2020.

## 2019-10-08 ENCOUNTER — Telehealth: Payer: Self-pay | Admitting: Gastroenterology

## 2019-10-08 ENCOUNTER — Other Ambulatory Visit: Payer: Self-pay

## 2019-10-08 ENCOUNTER — Ambulatory Visit: Payer: Medicare Other

## 2019-10-08 DIAGNOSIS — M5412 Radiculopathy, cervical region: Secondary | ICD-10-CM | POA: Diagnosis not present

## 2019-10-08 DIAGNOSIS — M25611 Stiffness of right shoulder, not elsewhere classified: Secondary | ICD-10-CM

## 2019-10-08 NOTE — Telephone Encounter (Signed)
Just FYI.

## 2019-10-08 NOTE — Telephone Encounter (Signed)
Given current COVID-19 related issues regarding increasing prevalence in our area, limited bed availability, along with her co-morbidities, will plan to postpone procedure for the time being.

## 2019-10-08 NOTE — Telephone Encounter (Signed)
Called and spoke with patient- patient reports she is anxious about having the procedure at the hospital due to COVID-patient is requesting to know if she is going to be near the patient's with COVID- patient reassured that precautions for COVID prevention/care are being taken -the COVID patient's with COVID are at an alternative facility; patient is more at ease with having the procedure; Patient advised to call back to the office at [phone] should questions/concerns arise;Patient verbalized understanding of information/instructions;

## 2019-10-08 NOTE — Therapy (Signed)
[place] MAIN [facility] SERVICES [address] [place], [place], [address] Phone: [phone]   Fax:  [phone]  Physical Therapy Treatment  Patient Details  Name: Chanita Boden MRN: [id number] Date of Birth: 1941-04-21 No data recorded  Encounter Date: 10/08/2019  PT End of Session - 10/08/19 1311    Visit Number  75    Number of Visits  93    Date for PT Re-Evaluation  12/24/19    PT Start Time  1694    PT Stop Time  1345    PT Time Calculation (min)  40 min    Activity Tolerance  Patient tolerated treatment well    Behavior During Therapy  [facility] for tasks assessed/performed       Past Medical History:  Diagnosis Date  . Angina   . Arthritis   . Atrial fibrillation (HCC)    ASPIRIN FOR BLOOD THINNER  . Atypical mole 09/22/2016  . Bulging discs    lumbar   . Cancer ([place])    melanoma  . Complication of anesthesia    pt states has choking sensation with ET tube   . Coronary artery disease   . Depression   . Diabetes mellitus    diet controlled/on meds  . Family history of breast cancer   . Family history of colon cancer   . Fibromyalgia   . GERD (gastroesophageal reflux disease)   . H/O hiatal hernia   . Headache(784.0)    "recurring"  . High cholesterol   . History of colon polyps   . Hyperlipidemia   . Hypertension   . 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 Migraines    "til ~ 1980"  . PONV (postoperative nausea and vomiting)   . Restless leg syndrome   . Sciatic nerve pain    "from pinched nerve"  . Sleep apnea    uses CPAP  . Stroke [facility]) 2014   no deficits  . Weakness of right side of body    "I've had PT for it; they don't know what it's from".  CORTISONE INJECTION INTO BACK 08/30/12    Past Surgical History:  Procedure Laterality Date  . [place] STUDY N/A 12/29/2015   Procedure: [place] STUDY;  Surgeon: Manus Gunning, MD;  Location: [facility];  Service: Gastroenterology;  Laterality: N/A;  . CARPAL  TUNNEL RELEASE Left 07/02/2015   Procedure: CARPAL TUNNEL RELEASE;  Surgeon: Frederik Pear, MD;  Location: [place];  Service: Orthopedics;  Laterality: Left;  . CATARACT EXTRACTION, BILATERAL Bilateral    Oct and Nov 2017  . CORONARY ANGIOPLASTY WITH STENT PLACEMENT  06/2011   "1"  . CORONARY ARTERY BYPASS GRAFT  2005   CABG X 2  . DILATION AND CURETTAGE OF UTERUS     "more than once"  . ELBOW ARTHROSCOPY Left 07/02/2015   Procedure: ARTHROSCOPY LEFT ELBOW WITH DEBRIDEMENT AND REMOVAL LOOSE BODY;  Surgeon: Frederik Pear, MD;  Location: [place];  Service: Orthopedics;  Laterality: Left;  . ESOPHAGEAL MANOMETRY N/A 12/29/2015   Procedure: ESOPHAGEAL MANOMETRY (EM);  Surgeon: Manus Gunning, MD;  Location: [facility];  Service: Gastroenterology;  Laterality: N/A;  . ESOPHAGEAL MANOMETRY N/A 07/30/2019   Procedure: ESOPHAGEAL MANOMETRY (EM);  Surgeon: Yetta Flock, MD;  Location: [facility];  Service: Gastroenterology;  Laterality: N/A;  . FRACTURE SURGERY  ~ 2005   nose  . KNEE ARTHROSCOPY  09/04/2012   Procedure: ARTHROSCOPY KNEE;  Surgeon:  Gearlean Alf, MD;  Location: [facility];  Service: Orthopedics;  Laterality: Right;  right knee arthroscopy with medial and lateral meniscus debridement  . 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 MOUTH SURGERY  2004   "bone replacement; had cadavear bones put in; face was collapsing"  . NASAL SEPTUM SURGERY  ~ 1986  . TOTAL KNEE ARTHROPLASTY Right 07/07/2013   Procedure: RIGHT TOTAL KNEE ARTHROPLASTY;  Surgeon: Gearlean Alf, MD;  Location: [facility];  Service: Orthopedics;  Laterality: Right;    There were no vitals filed for this visit.  Subjective Assessment - 10/08/19 1308    Subjective  Pt reports that she is doing well today. She is having increasing anxiety regarding going in for surgery due the risk of COVID. She is having increasing pain down her arm. Some numbness has persisted  but not as much.    Pertinent History  On 08/03/18 pt was carrying a folding table and fell and right arm was under the handle got stuck between the door and the table. She landed on her R side. Tried to get up and had to pull at her arm. Pt had immediate pain in her right shoulder. She waited until Monday and went to see Dr. Edilia Bo her PCP. He took radiographs but didn't find any fractures. She continued to have pain so she called Dr. Damita Dunnings office, her orthopedist, and saw him that same week. He took additional radiographs and found a hairline fracture in her humeral shaft and a greater tuberosity fracture per patient report. She states that she is not healing well and has recurrent plain film radiographs. She is still wearing a sling and was encouraged not to perform any AROM of her right shoulder. Since her last visit with Dr. Mayer Camel on 09/20/18 she has had an additional fall and landed on her R shoulder. She reports additional bruising and pain in her R shoulder. She has a follow-up appt scheduled with her orthopedist for 10/15/18. 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 ROS negative for red flags. 06/05/19: Pt presents to physical therapy with reports of intermittent RUE numbness and tingling that began about 4 weeks ago. The numbness and tingling begins at the shoulder and extends down into the elbow. On days when it is more intense, it begins in the cervical spine. After about 5 seconds, numbness tingling subsides, and she experiences min hyperalgesia. Pt was told by physician that if numbness and tingling does not improve with conservative therapy after 3 months an MRI will be ordered. She is currently being seen in physical therapy for R shoulder pain due to recovery post greater tubercle fracture.    Limitations  Lifting    Diagnostic tests  Plain film radiographs with decreased healing per pt report    Patient Stated Goals  Improve ability to use RUE and decrease pain     Currently in Pain?  Yes    Pain Score  3     Pain Location  Shoulder    Pain Orientation  Right    Pain Descriptors / Indicators  Aching;Sore    Pain Type  Chronic pain    Pain Onset  More than a month ago          TREATMENT:   Manual Therapy UBE 34mn forward/277m backward for warm-up during history; R GH A/P mobilizations at neutral, grade III, 30s/bout x 3 bouts; R first fib inferior mobilizations grade  III, 30s/bout x 2 bouts, pt reports reproduction of radicular symptoms down RUE; R median nerve glides x 10; R ulnar nerve glides x 10; R radial nerve glides x 10; R pec stretch 30s hold for both sternal and clavicular attachments;  R upper trap stretch x30s R levator scapula stretch x30s  R scalene lateral flexion stretch x30s R scalene lateral flexion with rotation and extension stretch x30s   Mechanical Cervical Traction, unbilled After manual therapy completed performed: Intermittent, 30 degree pull angle,25# 30s hold time,12# 10s rest time x 23mnutes total.Pt denies any numbness/tingling at end of traction.   Trigger Point Dry Needling (TDN), unbilled Education performed with patient regarding potential benefit of TDN. Extensive time spent with pt to ensure full understanding of TDN risks. Pt provided verbal consent to treatment. TDN performed toR upper trapwith 2, 0.30x 50 single needle placements with local twitch response (LTR). Pistoning technique utilized. Improved pain-free motion following intervention.   Pt educated throughout session about proper posture and technique with exercises. Improved exercise technique, movement at target joints, use of target muscles after min to mod verbal cues.   Pt reports positive response from TDN last session so repeated today. Continued with RUE neural mobilizations as well as R upper quarter muscle stretching. Pt is unable to perform these correctly independently and this is why she requires the treatment  to be performed by a physical therapist. Re-introduced mechanical traction with patient as she reports continued numbness/tingling as well as pain down her RUE. Pt will benefit from PT services to address deficits in strength, mobility, and pain in order to return to full function at home.                        PT Short Term Goals - 10/01/19 1454      PT SHORT TERM GOAL #1   Title  Pt will be independent with HEP in order to improve strength and decrease pain in order to improve pain-free function at home.    Time  4    Period  Weeks    Status  Achieved        PT Long Term Goals - 10/01/19 1454      PT LONG TERM GOAL #1   Title  Pt will decrease quick DASH score to below 30% in order to demonstrate clinically significant reduction in disability.    Baseline  09/30/18: 84.1%; 10/30/18: 56.8%; 11/27/18: 45.45%; 12/23/18: 40.9%; 01/21/19: 29.5%, 6/4: 43%; 05/22/19: 43.2%; 08/05/19: 25%    Time  8    Period  Weeks    Status  Achieved      PT LONG TERM GOAL #2   Title  Pt will decrease worst shoulder pain as reported on NPRS to below 4/10 in order to demonstrate clinically significant reduction in pain.    Baseline  09/30/18: worst: 9/10; 10/30/18: 6/10; 11/27/18: 4/10; 12/23/18: 5/10 (pain is less frequent); 01/21/19: 5/10 (pain is less frequent); 03/19/19: 4/10; 6/4: 5/10; 05/22/19: 4/10 (decreased frequency of pain as well); 08/05/19: 2/10    Time  8    Period  Weeks    Status  Achieved      PT LONG TERM GOAL #3   Title  Pt will improve PROM of R shoulder to within 10 degrees of R shoulder in order to improve functional use of R shoulder for ADLs/IADLs.    Baseline  R/L 129/150 Shoulder flexion, 100/165 Shoulder abduction, 50/93 Shoulder external rotation, 50/70 Shoulder internal  rotation; 10/30/18: R/L 140/150 Shoulder flexion, 132/165 Shoulder abduction, 82/93 Shoulder external rotation, 70/70 Shoulder internal rotation; 12/23/18:  R/L 155/150 Shoulder flexion, 134/165  Shoulder abduction, 89/93 Shoulder external rotation, 70/70 Shoulder internal rotation, 01/21/19: R/L 154/150 Shoulder flexion, 136/165 Shoulder abduction, 85/93 Shoulder external rotation, 70/70 Shoulder internal rotation; 03/19/19: Unable to test over telemedicine 6/4: R: 128 flexion, 121 abduction, 78 shoulder ER, 70 shoulder IR; 05/22/19: R: 148 flexion (154 L side), 145 abduction (166 L side), 85 shoulder ER, 76 shoulder IR; 08/05/19: R flexion 150 deg (L 162) R abduction 165 (L 170); R ER 89; R IR 90; 09/09/19: R flexion 152 deg R abduction 160 (L can reach full end range with PROM); 10/01/19: R flexion 160deg, R abduction 151deg, with pain at end of range.    Time  12    Period  Weeks    Status  Partially Met    Target Date  12/24/19      PT LONG TERM GOAL #4   Title  Pt will improve pain-free R shoulder strength for flexion and abduction so that it is symmetrical with L shoulder in order to return to full function at home    Baseline  12/23/18: R/L 4-/4+ Shoulder flexion, 4/4+ Shoulder abduction, both painful on R side; 01/21/19: R/L 4/4+ Shoulder flexion, 4/4+ Shoulder abduction, both painful on R side; 03/19/19: Unable to test over telemedicine 6/4: 4-/4; 05/22/19: R/L: Flexion: 4/4+ (no pain bilateral) Abduction: 4-/4 (painful on R side, no pain on L side); 08/05/19: R/L flexion 4/5 (some pain on R), R/L abduction 4+/5 but no pain; 09/09/19: R/L flexion 4+/4+, R/L abduction 4+/4+    Time  8    Period  Weeks    Status  Achieved      PT LONG TERM GOAL #5   Title  Pt will demonstrate decrease in NDI by at least 19% in order to demonstrate clinically significant reduction in disability related to neck injury/pain    Baseline  06/05/19: 36%; 08/05/19: 8%    Time  8    Period  Weeks    Status  Achieved            Plan - 10/08/19 1341    Clinical Impression Statement  Pt reports positive response from TDN last session so repeated today. Continued with RUE neural mobilizations as well as R upper  quarter muscle stretching. Pt is unable to perform these correctly independently and this is why she requires the treatment to be performed by a physical therapist. Re-introduced mechanical traction with patient as she reports continued numbness/tingling as well as pain down her RUE. Pt will benefit from PT services to address deficits in strength, mobility, and pain in order to return to full function at home.    Personal Factors and Comorbidities  Age;Comorbidity 2;Past/Current Experience    Comorbidities  afib, CAD, fibromyalgia, DM, depression    Examination-Activity Limitations  Bathing;Carry    Examination-Participation Restrictions  Cleaning;Community Activity    Rehab Potential  Good    PT Frequency  1x / week    PT Duration  12 weeks    PT Treatment/Interventions  ADLs/Self Care Home Management;Aquatic Therapy;Canalith Repostioning;Cryotherapy;Electrical Stimulation;Iontophoresis '4mg'$ /ml Dexamethasone;Moist Heat;Traction;Ultrasound;Contrast Bath;DME Instruction;Gait training;Stair training;Functional mobility training;Therapeutic activities;Therapeutic exercise;Balance training;Neuromuscular re-education;Patient/family education;Manual techniques;Passive range of motion;Dry needling;Splinting;Taping;Vestibular;Spinal Manipulations    PT Next Visit Plan  TDN, reassess trigger points in scapular region. progress shoulder exercises.  cervical retractions, deep neck flexor exercises, nerve glides, cervical traction, cervical mobilizations    PT  Home Exercise Plan  Cervical retractions, Scapular retractions, R upper trap stretch, supine canes for AAROM flexion with hands together progressing to AROM supine flexion, AROM sidelying shoulder abduction, AROM sidelying shoulder ER, seated pulleys for flexion and scaption ([id number])       Patient will benefit from skilled therapeutic intervention in order to improve the following deficits and impairments:  Decreased strength, Decreased range of motion,  Impaired UE functional use, Pain  Visit Diagnosis: Radiculopathy, cervical region  Stiffness of right shoulder, not elsewhere classified     Problem List Patient Active Problem List   Diagnosis Date Noted  . Genetic testing 08/25/2019  . Dysphagia   . Globus sensation   . Family history of breast cancer   . History of colon polyps   . Chronic diarrhea 01/14/2019  . Malignant melanoma ([place]) 11/23/2016  . Urge incontinence 11/23/2016  . Atypical mole 09/22/2016  . Slurring of speech 05/12/2016  . Traumatic arthritis elbow 05/04/2015  . Left foot pain 12/31/2014  . Severe sprain of left ankle 12/31/2014  . Osteopenia 09/15/2014  . History of TIA (transient ischemic attack) 05/19/2014  . Fracture of coronoid process of ulna, left, closed 04/25/2014  . Ingrown toenail 01/15/2014  . Obesity (BMI 30-39.9) 10/08/2013  . OA (osteoarthritis) of knee 07/07/2013  . Chronic low back pain 05/09/2013  . Obstructive sleep apnea 05/09/2013  . GERD (gastroesophageal reflux disease) 05/09/2013  . Hiatal hernia 05/09/2013  . Fibromyalgia syndrome 05/09/2013  . Depression, major, in partial remission ([place]) 05/09/2013  . RLS (restless legs syndrome) 05/09/2013  . Diabetes mellitus with no complication ([place]) 83/15/1761  . High cholesterol 05/09/2013  . 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 Family history of colon cancer 05/09/2013  . Allergic rhinitis 05/09/2013  . Lateral meniscal tear 09/04/2012  . CAD (coronary artery disease) 09/27/2011  . HTN (hypertension) 09/27/2011  . Paroxysmal A-fib ([place]) 09/27/2011   Phillips Grout PT, DPT, GCS  Johan Antonacci 10/08/2019, 2:05 PM  [place] MAIN [facility] SERVICES [address] [place], [place], [address] Phone: [phone]   Fax:  [phone]  Name: Reanna Scoggin MRN: [id number] Date of Birth: [birth year]

## 2019-10-13 ENCOUNTER — Other Ambulatory Visit (HOSPITAL_COMMUNITY): Payer: Medicare Other

## 2019-10-13 NOTE — Telephone Encounter (Signed)
Left message for patient to call back to the office;  

## 2019-10-13 NOTE — Telephone Encounter (Signed)
COVID screening appt cancelled;

## 2019-10-13 NOTE — Telephone Encounter (Signed)
Patient returned call to the office- patient was given information concerning postponing her procedure- patient is agreeable to plan of care and will await a call from the office as to when she can be rescheduled for TIF; Patient verbalized understanding of information/instructions;   Patient advised to call back to the office at [phone] should questions/concerns arise;

## 2019-10-16 ENCOUNTER — Ambulatory Visit (HOSPITAL_COMMUNITY): Admission: RE | Admit: 2019-10-16 | Payer: Medicare Other | Source: Home / Self Care | Admitting: Gastroenterology

## 2019-10-16 ENCOUNTER — Encounter (HOSPITAL_COMMUNITY): Admission: RE | Payer: Self-pay | Source: Home / Self Care

## 2019-10-16 SURGERY — ESOPHAGOGASTRODUODENOSCOPY (EGD) WITH PROPOFOL
Anesthesia: General

## 2019-10-21 ENCOUNTER — Other Ambulatory Visit: Payer: Self-pay

## 2019-10-21 ENCOUNTER — Ambulatory Visit: Payer: Medicare Other | Attending: Orthopedic Surgery

## 2019-10-21 DIAGNOSIS — M6281 Muscle weakness (generalized): Secondary | ICD-10-CM | POA: Insufficient documentation

## 2019-10-21 DIAGNOSIS — M25611 Stiffness of right shoulder, not elsewhere classified: Secondary | ICD-10-CM | POA: Insufficient documentation

## 2019-10-21 DIAGNOSIS — M5412 Radiculopathy, cervical region: Secondary | ICD-10-CM | POA: Insufficient documentation

## 2019-10-21 NOTE — Therapy (Signed)
[place] MAIN [facility] SERVICES [address] [place], [place], [address] Phone: [phone]   Fax:  [phone]  Physical Therapy Treatment  Patient Details  Name: Morgan [name] MRN: [id number] Date of Birth: November 26, 1940 No data recorded  Encounter Date: 10/21/2019  PT End of Session - 10/21/19 1604    Visit Number  69    Number of Visits  93    Date for PT Re-Evaluation  12/24/19    PT Start Time  0850    PT Stop Time  0930    PT Time Calculation (min)  40 min    Activity Tolerance  Patient tolerated treatment well    Behavior During Therapy  [facility] for tasks assessed/performed       Past Medical History:  Diagnosis Date  . Angina   . Arthritis   . Atrial fibrillation (HCC)    ASPIRIN FOR BLOOD THINNER  . Atypical mole 09/22/2016  . Bulging discs    lumbar   . Cancer ([place])    melanoma  . Complication of anesthesia    pt states has choking sensation with ET tube   . Coronary artery disease   . Depression   . Diabetes mellitus    diet controlled/on meds  . Family history of breast cancer   . Family history of colon cancer   . Fibromyalgia   . GERD (gastroesophageal reflux disease)   . H/O hiatal hernia   . Headache(784.0)    "recurring"  . High cholesterol   . History of colon polyps   . Hyperlipidemia   . Hypertension   . 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 Migraines    "til ~ 1980"  . PONV (postoperative nausea and vomiting)   . Restless leg syndrome   . Sciatic nerve pain    "from pinched nerve"  . Sleep apnea    uses CPAP  . Stroke [facility]) 2014   no deficits  . Weakness of right side of body    "I've had PT for it; they don't know what it's from".  CORTISONE INJECTION INTO BACK 08/30/12    Past Surgical History:  Procedure Laterality Date  . [place] STUDY N/A 12/29/2015   Procedure: [place] STUDY;  Surgeon: Manus Gunning, MD;  Location: [facility];  Service: Gastroenterology;  Laterality: N/A;  . CARPAL  TUNNEL RELEASE Left 07/02/2015   Procedure: CARPAL TUNNEL RELEASE;  Surgeon: Frederik Pear, MD;  Location: [place];  Service: Orthopedics;  Laterality: Left;  . CATARACT EXTRACTION, BILATERAL Bilateral    Oct and Nov 2017  . CORONARY ANGIOPLASTY WITH STENT PLACEMENT  06/2011   "1"  . CORONARY ARTERY BYPASS GRAFT  2005   CABG X 2  . DILATION AND CURETTAGE OF UTERUS     "more than once"  . ELBOW ARTHROSCOPY Left 07/02/2015   Procedure: ARTHROSCOPY LEFT ELBOW WITH DEBRIDEMENT AND REMOVAL LOOSE BODY;  Surgeon: Frederik Pear, MD;  Location: [place] [facility];  Service: Orthopedics;  Laterality: Left;  . ESOPHAGEAL MANOMETRY N/A 12/29/2015   Procedure: ESOPHAGEAL MANOMETRY (EM);  Surgeon: Manus Gunning, MD;  Location: [facility];  Service: Gastroenterology;  Laterality: N/A;  . ESOPHAGEAL MANOMETRY N/A 07/30/2019   Procedure: ESOPHAGEAL MANOMETRY (EM);  Surgeon: Yetta Flock, MD;  Location: [facility];  Service: Gastroenterology;  Laterality: N/A;  . FRACTURE SURGERY  ~ 2005   nose  . KNEE ARTHROSCOPY  09/04/2012   Procedure: ARTHROSCOPY KNEE;  Surgeon:  Gearlean Alf, MD;  Location: [facility];  Service: Orthopedics;  Laterality: Right;  right knee arthroscopy with medial and lateral meniscus debridement  . 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 MOUTH SURGERY  2004   "bone replacement; had cadavear bones put in; face was collapsing"  . NASAL SEPTUM SURGERY  ~ 1986  . TOTAL KNEE ARTHROPLASTY Right 07/07/2013   Procedure: RIGHT TOTAL KNEE ARTHROPLASTY;  Surgeon: Gearlean Alf, MD;  Location: [facility];  Service: Orthopedics;  Laterality: Right;    There were no vitals filed for this visit.  Subjective Assessment - 10/21/19 0911    Subjective  Pt reports that her surgeon cancelled her GI surgery due to the [place] census in the hospital. She is complaining of 4/10 R shoulder pain upon arrival today. She also states that her numbness/tingling  is worsening and it is spreading all the way down to her hand.    Pertinent History  On 08/03/18 pt was carrying a folding table and fell and right arm was under the handle got stuck between the door and the table. She landed on her R side. Tried to get up and had to pull at her arm. Pt had immediate pain in her right shoulder. She waited until Monday and went to see Dr. Edilia Bo her PCP. He took radiographs but didn't find any fractures. She continued to have pain so she called Dr. Damita Dunnings office, her orthopedist, and saw him that same week. He took additional radiographs and found a hairline fracture in her humeral shaft and a greater tuberosity fracture per patient report. She states that she is not healing well and has recurrent plain film radiographs. She is still wearing a sling and was encouraged not to perform any AROM of her right shoulder. Since her last visit with Dr. Mayer Camel on 09/20/18 she has had an additional fall and landed on her R shoulder. She reports additional bruising and pain in her R shoulder. She has a follow-up appt scheduled with her orthopedist for 10/15/18. 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 ROS negative for red flags. 06/05/19: Pt presents to physical therapy with reports of intermittent RUE numbness and tingling that began about 4 weeks ago. The numbness and tingling begins at the shoulder and extends down into the elbow. On days when it is more intense, it begins in the cervical spine. After about 5 seconds, numbness tingling subsides, and she experiences min hyperalgesia. Pt was told by physician that if numbness and tingling does not improve with conservative therapy after 3 months an MRI will be ordered. She is currently being seen in physical therapy for R shoulder pain due to recovery post greater tubercle fracture.    Limitations  Lifting    Diagnostic tests  Plain film radiographs with decreased healing per pt report    Patient Stated Goals   Improve ability to use RUE and decrease pain    Currently in Pain?  Yes    Pain Score  4     Pain Location  Shoulder    Pain Orientation  Right    Pain Descriptors / Indicators  Aching    Pain Type  Chronic pain    Pain Onset  More than a month ago          TREATMENT:  Therapeutic Exercise UBE: 2 minutes fwd, 2 minutes retro during history; UE ranger for R shoulder flexion, abduction, cross body adduction,  circles (CW/CCW) x 15 in each direction; Standing rows with red tband x 15; Standing R shoulder extension with red tband x 15; Standing R shoulder ER with red tband x 15;   Manual Therapy UBE 68mn forward/260m backward for warm-up during history; R pec stretch 30s hold for both sternal and clavicular attachments;  R upper trap stretch x 30s R levator scapula stretch x30s  Rscalene lateral flexion stretch x30s R first rib mobs, grade II-III, 30s/bout x 2 bouts; STM to R pec minor, anterior deltoid, lateral deltoid, upper trap, and levator scapula;    Pt educated throughout session about proper posture and technique with exercises. Improved exercise technique, movement at target joints, use of target muscles after min to mod verbal cues.   Pt reports that her N/T in RUE is worsening. Therapist advised pt to contact neurosurgeon for direction about how to proceed. Her response to therapy is inconsistent without any discernable improvement following a specific intervention. She is very tender to light palpation especially over anterior shoulder. However her R upper trap is not as tender today. Continued with R upper quarter muscle stretching. Pt is unable to perform these correctly independently and this is why she requires the treatment to be performed by a physical therapist. Pt will benefit from PT services to address deficits in strength, mobility, and pain in order to return to full function at home.                       PT Short Term Goals -  10/01/19 1454      PT SHORT TERM GOAL #1   Title  Pt will be independent with HEP in order to improve strength and decrease pain in order to improve pain-free function at home.    Time  4    Period  Weeks    Status  Achieved        PT Long Term Goals - 10/01/19 1454      PT LONG TERM GOAL #1   Title  Pt will decrease quick DASH score to below 30% in order to demonstrate clinically significant reduction in disability.    Baseline  09/30/18: 84.1%; 10/30/18: 56.8%; 11/27/18: 45.45%; 12/23/18: 40.9%; 01/21/19: 29.5%, 6/4: 43%; 05/22/19: 43.2%; 08/05/19: 25%    Time  8    Period  Weeks    Status  Achieved      PT LONG TERM GOAL #2   Title  Pt will decrease worst shoulder pain as reported on NPRS to below 4/10 in order to demonstrate clinically significant reduction in pain.    Baseline  09/30/18: worst: 9/10; 10/30/18: 6/10; 11/27/18: 4/10; 12/23/18: 5/10 (pain is less frequent); 01/21/19: 5/10 (pain is less frequent); 03/19/19: 4/10; 6/4: 5/10; 05/22/19: 4/10 (decreased frequency of pain as well); 08/05/19: 2/10    Time  8    Period  Weeks    Status  Achieved      PT LONG TERM GOAL #3   Title  Pt will improve PROM of R shoulder to within 10 degrees of R shoulder in order to improve functional use of R shoulder for ADLs/IADLs.    Baseline  R/L 129/150 Shoulder flexion, 100/165 Shoulder abduction, 50/93 Shoulder external rotation, 50/70 Shoulder internal rotation; 10/30/18: R/L 140/150 Shoulder flexion, 132/165 Shoulder abduction, 82/93 Shoulder external rotation, 70/70 Shoulder internal rotation; 12/23/18:  R/L 155/150 Shoulder flexion, 134/165 Shoulder abduction, 89/93 Shoulder external rotation, 70/70 Shoulder internal rotation, 01/21/19: R/L 154/150 Shoulder flexion, 136/165 Shoulder abduction,  85/93 Shoulder external rotation, 70/70 Shoulder internal rotation; 03/19/19: Unable to test over telemedicine 6/4: R: 128 flexion, 121 abduction, 78 shoulder ER, 70 shoulder IR; 05/22/19: R: 148 flexion (154 L side),  145 abduction (166 L side), 85 shoulder ER, 76 shoulder IR; 08/05/19: R flexion 150 deg (L 162) R abduction 165 (L 170); R ER 89; R IR 90; 09/09/19: R flexion 152 deg R abduction 160 (L can reach full end range with PROM); 10/01/19: R flexion 160deg, R abduction 151deg, with pain at end of range.    Time  12    Period  Weeks    Status  Partially Met    Target Date  12/24/19      PT LONG TERM GOAL #4   Title  Pt will improve pain-free R shoulder strength for flexion and abduction so that it is symmetrical with L shoulder in order to return to full function at home    Baseline  12/23/18: R/L 4-/4+ Shoulder flexion, 4/4+ Shoulder abduction, both painful on R side; 01/21/19: R/L 4/4+ Shoulder flexion, 4/4+ Shoulder abduction, both painful on R side; 03/19/19: Unable to test over telemedicine 6/4: 4-/4; 05/22/19: R/L: Flexion: 4/4+ (no pain bilateral) Abduction: 4-/4 (painful on R side, no pain on L side); 08/05/19: R/L flexion 4/5 (some pain on R), R/L abduction 4+/5 but no pain; 09/09/19: R/L flexion 4+/4+, R/L abduction 4+/4+    Time  8    Period  Weeks    Status  Achieved      PT LONG TERM GOAL #5   Title  Pt will demonstrate decrease in NDI by at least 19% in order to demonstrate clinically significant reduction in disability related to neck injury/pain    Baseline  06/05/19: 36%; 08/05/19: 8%    Time  8    Period  Weeks    Status  Achieved            Plan - 10/21/19 1605    Clinical Impression Statement  Pt reports that her N/T in RUE is worsening. Therapist advised pt to contact neurosurgeon for direction about how to proceed. Her response to therapy is inconsistent without any discernable improvement following a specific intervention. She is very tender to light palpation especially over anterior shoulder. However her R upper trap is not as tender today. Continued with R upper quarter muscle stretching. Pt is unable to perform these correctly independently and this is why she requires the  treatment to be performed by a physical therapist. Pt will benefit from PT services to address deficits in strength, mobility, and pain in order to return to full function at home.    Personal Factors and Comorbidities  Age;Comorbidity 2;Past/Current Experience    Comorbidities  afib, CAD, fibromyalgia, DM, depression    Examination-Activity Limitations  Bathing;Carry    Examination-Participation Restrictions  Cleaning;Community Activity    Rehab Potential  Good    PT Frequency  1x / week    PT Duration  12 weeks    PT Treatment/Interventions  ADLs/Self Care Home Management;Aquatic Therapy;Canalith Repostioning;Cryotherapy;Electrical Stimulation;Iontophoresis '4mg'$ /ml Dexamethasone;Moist Heat;Traction;Ultrasound;Contrast Bath;DME Instruction;Gait training;Stair training;Functional mobility training;Therapeutic activities;Therapeutic exercise;Balance training;Neuromuscular re-education;Patient/family education;Manual techniques;Passive range of motion;Dry needling;Splinting;Taping;Vestibular;Spinal Manipulations    PT Next Visit Plan  TDN, reassess trigger points in scapular region. progress shoulder exercises.  cervical retractions, deep neck flexor exercises, nerve glides, cervical traction, cervical mobilizations    PT Home Exercise Plan  Cervical retractions, Scapular retractions, R upper trap stretch, supine canes for AAROM flexion with hands together  progressing to AROM supine flexion, AROM sidelying shoulder abduction, AROM sidelying shoulder ER, seated pulleys for flexion and scaption ([id number])       Patient will benefit from skilled therapeutic intervention in order to improve the following deficits and impairments:  Decreased strength, Decreased range of motion, Impaired UE functional use, Pain  Visit Diagnosis: Radiculopathy, cervical region  Stiffness of right shoulder, not elsewhere classified  Muscle weakness (generalized)     Problem List Patient Active Problem List    Diagnosis Date Noted  . Genetic testing 08/25/2019  . Dysphagia   . Globus sensation   . Family history of breast cancer   . History of colon polyps   . Chronic diarrhea 01/14/2019  . Malignant melanoma ([place]) 11/23/2016  . Urge incontinence 11/23/2016  . Atypical mole 09/22/2016  . Slurring of speech 05/12/2016  . Traumatic arthritis elbow 05/04/2015  . Left foot pain 12/31/2014  . Severe sprain of left ankle 12/31/2014  . Osteopenia 09/15/2014  . History of TIA (transient ischemic attack) 05/19/2014  . Fracture of coronoid process of ulna, left, closed 04/25/2014  . Ingrown toenail 01/15/2014  . Obesity (BMI 30-39.9) 10/08/2013  . OA (osteoarthritis) of knee 07/07/2013  . Chronic low back pain 05/09/2013  . Obstructive sleep apnea 05/09/2013  . GERD (gastroesophageal reflux disease) 05/09/2013  . Hiatal hernia 05/09/2013  . Fibromyalgia syndrome 05/09/2013  . Depression, major, in partial remission ([place]) 05/09/2013  . RLS (restless legs syndrome) 05/09/2013  . Diabetes mellitus with no complication ([place]) 97/98/9211  . High cholesterol 05/09/2013  . 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 Family history of colon cancer 05/09/2013  . Allergic rhinitis 05/09/2013  . Lateral meniscal tear 09/04/2012  . CAD (coronary artery disease) 09/27/2011  . HTN (hypertension) 09/27/2011  . Paroxysmal A-fib ([place]) 09/27/2011   Phillips Grout PT, DPT, GCS  Huprich,Jason 10/21/2019, 4:09 PM  [place] MAIN [facility] SERVICES [address] [place], [place], [address] Phone: [phone]   Fax:  [phone]  Name: Morgan [name] MRN: [id number] Date of Birth: 06/27/1941

## 2019-10-23 ENCOUNTER — Other Ambulatory Visit: Payer: Self-pay

## 2019-10-23 ENCOUNTER — Ambulatory Visit: Payer: Medicare Other

## 2019-10-23 DIAGNOSIS — M6281 Muscle weakness (generalized): Secondary | ICD-10-CM

## 2019-10-23 DIAGNOSIS — M25611 Stiffness of right shoulder, not elsewhere classified: Secondary | ICD-10-CM

## 2019-10-23 DIAGNOSIS — M5412 Radiculopathy, cervical region: Secondary | ICD-10-CM | POA: Diagnosis not present

## 2019-10-23 NOTE — Therapy (Signed)
[place] MAIN [facility] SERVICES [address] [place], [place], [address] Phone: [phone]   Fax:  [phone]  Physical Therapy Treatment  Patient Details  Name: Morgan [name] MRN: [id number] Date of Birth: 1941-10-06 No data recorded  Encounter Date: 10/23/2019  PT End of Session - 10/23/19 1301    Visit Number  1    Number of Visits  93    Date for PT Re-Evaluation  12/24/19    Authorization Type  09/09/19    PT Start Time  1115    PT Stop Time  1200    PT Time Calculation (min)  45 min    Activity Tolerance  Patient tolerated treatment well    Behavior During Therapy  [facility] for tasks assessed/performed       Past Medical History:  Diagnosis Date  . Angina   . Arthritis   . Atrial fibrillation (HCC)    ASPIRIN FOR BLOOD THINNER  . Atypical mole 09/22/2016  . Bulging discs    lumbar   . Cancer ([place])    melanoma  . Complication of anesthesia    pt states has choking sensation with ET tube   . Coronary artery disease   . Depression   . Diabetes mellitus    diet controlled/on meds  . Family history of breast cancer   . Family history of colon cancer   . Fibromyalgia   . GERD (gastroesophageal reflux disease)   . H/O hiatal hernia   . Headache(784.0)    "recurring"  . High cholesterol   . History of colon polyps   . Hyperlipidemia   . Hypertension   . 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 Migraines    "til ~ 1980"  . PONV (postoperative nausea and vomiting)   . Restless leg syndrome   . Sciatic nerve pain    "from pinched nerve"  . Sleep apnea    uses CPAP  . Stroke [facility]) 2014   no deficits  . Weakness of right side of body    "I've had PT for it; they don't know what it's from".  CORTISONE INJECTION INTO BACK 08/30/12    Past Surgical History:  Procedure Laterality Date  . [place] STUDY N/A 12/29/2015   Procedure: [place] STUDY;  Surgeon: Manus Gunning, MD;  Location: [facility];  Service:  Gastroenterology;  Laterality: N/A;  . CARPAL TUNNEL RELEASE Left 07/02/2015   Procedure: CARPAL TUNNEL RELEASE;  Surgeon: Frederik Pear, MD;  Location: [place];  Service: Orthopedics;  Laterality: Left;  . CATARACT EXTRACTION, BILATERAL Bilateral    Oct and Nov 2017  . CORONARY ANGIOPLASTY WITH STENT PLACEMENT  06/2011   "1"  . CORONARY ARTERY BYPASS GRAFT  2005   CABG X 2  . DILATION AND CURETTAGE OF UTERUS     "more than once"  . ELBOW ARTHROSCOPY Left 07/02/2015   Procedure: ARTHROSCOPY LEFT ELBOW WITH DEBRIDEMENT AND REMOVAL LOOSE BODY;  Surgeon: Frederik Pear, MD;  Location: [place];  Service: Orthopedics;  Laterality: Left;  . ESOPHAGEAL MANOMETRY N/A 12/29/2015   Procedure: ESOPHAGEAL MANOMETRY (EM);  Surgeon: Manus Gunning, MD;  Location: [facility];  Service: Gastroenterology;  Laterality: N/A;  . ESOPHAGEAL MANOMETRY N/A 07/30/2019   Procedure: ESOPHAGEAL MANOMETRY (EM);  Surgeon: Yetta Flock, MD;  Location: [facility];  Service: Gastroenterology;  Laterality: N/A;  . FRACTURE SURGERY  ~ 2005   nose  . KNEE ARTHROSCOPY  09/04/2012  Procedure: ARTHROSCOPY KNEE;  Surgeon: Gearlean Alf, MD;  Location: [facility];  Service: Orthopedics;  Laterality: Right;  right knee arthroscopy with medial and lateral meniscus debridement  . 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 MOUTH SURGERY  2004   "bone replacement; had cadavear bones put in; face was collapsing"  . NASAL SEPTUM SURGERY  ~ 1986  . TOTAL KNEE ARTHROPLASTY Right 07/07/2013   Procedure: RIGHT TOTAL KNEE ARTHROPLASTY;  Surgeon: Gearlean Alf, MD;  Location: [facility];  Service: Orthopedics;  Laterality: Right;    There were no vitals filed for this visit.  Subjective Assessment - 10/23/19 1121    Subjective  Pt reports that she is feeling increased pain in her R upper arm today with certain motions but not at rest. She states that she is unsure what causes it  to hurt more or what increases her numbness/tingling. She does know that it hurts more and her N/T increases when she props her arm up into abduction.    Pertinent History  On 08/03/18 pt was carrying a folding table and fell and right arm was under the handle got stuck between the door and the table. She landed on her R side. Tried to get up and had to pull at her arm. Pt had immediate pain in her right shoulder. She waited until Monday and went to see Dr. Edilia Bo her PCP. He took radiographs but didn't find any fractures. She continued to have pain so she called Dr. Damita Dunnings office, her orthopedist, and saw him that same week. He took additional radiographs and found a hairline fracture in her humeral shaft and a greater tuberosity fracture per patient report. She states that she is not healing well and has recurrent plain film radiographs. She is still wearing a sling and was encouraged not to perform any AROM of her right shoulder. Since her last visit with Dr. Mayer Camel on 09/20/18 she has had an additional fall and landed on her R shoulder. She reports additional bruising and pain in her R shoulder. She has a follow-up appt scheduled with her orthopedist for 10/15/18. 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 ROS negative for red flags. 06/05/19: Pt presents to physical therapy with reports of intermittent RUE numbness and tingling that began about 4 weeks ago. The numbness and tingling begins at the shoulder and extends down into the elbow. On days when it is more intense, it begins in the cervical spine. After about 5 seconds, numbness tingling subsides, and she experiences min hyperalgesia. Pt was told by physician that if numbness and tingling does not improve with conservative therapy after 3 months an MRI will be ordered. She is currently being seen in physical therapy for R shoulder pain due to recovery post greater tubercle fracture.    Limitations  Lifting    Diagnostic tests   Plain film radiographs with decreased healing per pt report    Patient Stated Goals  Improve ability to use RUE and decrease pain    Currently in Pain?  No/denies          TREATMENT   Manual Therapy UBE 76mn forward/238m backward for warm-up during history; R shoulder PROM with end range holds for flexion, abduction, ER, and IR; R shoulder A/P mobs at neutral, grade II, 30s/bout x 3 bouts; R shoulder A/P mobs at 90 flexion, grade II, 30s/bout x 3 bouts;  R shoulder cross body stretch 30s  x 2; Rscalenelateral flexionstretch x 30s; R first rib mobs, grade II-III, 30s/bout x 2 bouts; Gentle manual cervical traction 20s hold x 2; STM to anterior deltoid, lateral deltoid, upper trap, and levator scapula; Nerve glides median, ulnar, and radial;   Pt educated throughout session about proper posture and technique with exercises. Improved exercise technique, movement at target joints, use of target muscles after min to mod verbal cues.   Pt reports that her RUE numbness/tingling and pain is worsening. She has had a few falls in the last couple months and is concerned that she possibly re injured her shoulder. She has not been demonstrating any improvement in her R shoulder over the last few weeks and if anything it appears to be worsening. Pt agrees to put therapy on hold at this time and follow-up with her orthopedist to discuss plan of care moving forward. She was also advised to contact her neurosurgeon for direction about how to proceed with respect to the worsening numbness/tingling she is experiencing.  Pt encouraged to have her orthopedist re-refer her for additional therapy if he believes that it will be helpful pending review of the plan of care.                       PT Short Term Goals - 10/01/19 1454      PT SHORT TERM GOAL #1   Title  Pt will be independent with HEP in order to improve strength and decrease pain in order to improve pain-free  function at home.    Time  4    Period  Weeks    Status  Achieved        PT Long Term Goals - 10/01/19 1454      PT LONG TERM GOAL #1   Title  Pt will decrease quick DASH score to below 30% in order to demonstrate clinically significant reduction in disability.    Baseline  09/30/18: 84.1%; 10/30/18: 56.8%; 11/27/18: 45.45%; 12/23/18: 40.9%; 01/21/19: 29.5%, 6/4: 43%; 05/22/19: 43.2%; 08/05/19: 25%    Time  8    Period  Weeks    Status  Achieved      PT LONG TERM GOAL #2   Title  Pt will decrease worst shoulder pain as reported on NPRS to below 4/10 in order to demonstrate clinically significant reduction in pain.    Baseline  09/30/18: worst: 9/10; 10/30/18: 6/10; 11/27/18: 4/10; 12/23/18: 5/10 (pain is less frequent); 01/21/19: 5/10 (pain is less frequent); 03/19/19: 4/10; 6/4: 5/10; 05/22/19: 4/10 (decreased frequency of pain as well); 08/05/19: 2/10    Time  8    Period  Weeks    Status  Achieved      PT LONG TERM GOAL #3   Title  Pt will improve PROM of R shoulder to within 10 degrees of R shoulder in order to improve functional use of R shoulder for ADLs/IADLs.    Baseline  R/L 129/150 Shoulder flexion, 100/165 Shoulder abduction, 50/93 Shoulder external rotation, 50/70 Shoulder internal rotation; 10/30/18: R/L 140/150 Shoulder flexion, 132/165 Shoulder abduction, 82/93 Shoulder external rotation, 70/70 Shoulder internal rotation; 12/23/18:  R/L 155/150 Shoulder flexion, 134/165 Shoulder abduction, 89/93 Shoulder external rotation, 70/70 Shoulder internal rotation, 01/21/19: R/L 154/150 Shoulder flexion, 136/165 Shoulder abduction, 85/93 Shoulder external rotation, 70/70 Shoulder internal rotation; 03/19/19: Unable to test over telemedicine 6/4: R: 128 flexion, 121 abduction, 78 shoulder ER, 70 shoulder IR; 05/22/19: R: 148 flexion (154 L side), 145 abduction (166 L side), 85  shoulder ER, 76 shoulder IR; 08/05/19: R flexion 150 deg (L 162) R abduction 165 (L 170); R ER 89; R IR 90; 09/09/19: R flexion 152  deg R abduction 160 (L can reach full end range with PROM); 10/01/19: R flexion 160deg, R abduction 151deg, with pain at end of range.    Time  12    Period  Weeks    Status  Partially Met    Target Date  12/24/19      PT LONG TERM GOAL #4   Title  Pt will improve pain-free R shoulder strength for flexion and abduction so that it is symmetrical with L shoulder in order to return to full function at home    Baseline  12/23/18: R/L 4-/4+ Shoulder flexion, 4/4+ Shoulder abduction, both painful on R side; 01/21/19: R/L 4/4+ Shoulder flexion, 4/4+ Shoulder abduction, both painful on R side; 03/19/19: Unable to test over telemedicine 6/4: 4-/4; 05/22/19: R/L: Flexion: 4/4+ (no pain bilateral) Abduction: 4-/4 (painful on R side, no pain on L side); 08/05/19: R/L flexion 4/5 (some pain on R), R/L abduction 4+/5 but no pain; 09/09/19: R/L flexion 4+/4+, R/L abduction 4+/4+    Time  8    Period  Weeks    Status  Achieved      PT LONG TERM GOAL #5   Title  Pt will demonstrate decrease in NDI by at least 19% in order to demonstrate clinically significant reduction in disability related to neck injury/pain    Baseline  06/05/19: 36%; 08/05/19: 8%    Time  8    Period  Weeks    Status  Achieved            Plan - 10/23/19 1125    Clinical Impression Statement  Pt reports that her RUE numbness/tingling and pain is worsening. She has had a few falls in the last couple months and is concerned that she possibly re injured her shoulder. She has not been demonstrating any improvement in her R shoulder over the last few weeks and if anything it appears to be worsening. Pt agrees to put therapy on hold at this time and follow-up with her orthopedist to discuss plan of care moving forward. She was also advised to contact her neurosurgeon for direction about how to proceed with respect to the worsening numbness/tingling she is experiencing.  Pt encouraged to have her orthopedist re-refer her for additional therapy if he  believes that it will be helpful pending review of the plan of care.    Personal Factors and Comorbidities  Age;Comorbidity 2;Past/Current Experience    Comorbidities  afib, CAD, fibromyalgia, DM, depression    Examination-Activity Limitations  Bathing;Carry    Examination-Participation Restrictions  Cleaning;Community Activity    Rehab Potential  Good    PT Frequency  1x / week    PT Duration  12 weeks    PT Treatment/Interventions  ADLs/Self Care Home Management;Aquatic Therapy;Canalith Repostioning;Cryotherapy;Electrical Stimulation;Iontophoresis '4mg'$ /ml Dexamethasone;Moist Heat;Traction;Ultrasound;Contrast Bath;DME Instruction;Gait training;Stair training;Functional mobility training;Therapeutic activities;Therapeutic exercise;Balance training;Neuromuscular re-education;Patient/family education;Manual techniques;Passive range of motion;Dry needling;Splinting;Taping;Vestibular;Spinal Manipulations    PT Next Visit Plan  TDN, reassess trigger points in scapular region. progress shoulder exercises.  cervical retractions, deep neck flexor exercises, nerve glides, cervical traction, cervical mobilizations    PT Home Exercise Plan  Cervical retractions, Scapular retractions, R upper trap stretch, supine canes for AAROM flexion with hands together progressing to AROM supine flexion, AROM sidelying shoulder abduction, AROM sidelying shoulder ER, seated pulleys for flexion and scaption ([id number])  Patient will benefit from skilled therapeutic intervention in order to improve the following deficits and impairments:  Decreased strength, Decreased range of motion, Impaired UE functional use, Pain  Visit Diagnosis: Radiculopathy, cervical region  Stiffness of right shoulder, not elsewhere classified  Muscle weakness (generalized)     Problem List Patient Active Problem List   Diagnosis Date Noted  . Genetic testing 08/25/2019  . Dysphagia   . Globus sensation   . Family history of breast  cancer   . History of colon polyps   . Chronic diarrhea 01/14/2019  . Malignant melanoma ([place]) 11/23/2016  . Urge incontinence 11/23/2016  . Atypical mole 09/22/2016  . Slurring of speech 05/12/2016  . Traumatic arthritis elbow 05/04/2015  . Left foot pain 12/31/2014  . Severe sprain of left ankle 12/31/2014  . Osteopenia 09/15/2014  . History of TIA (transient ischemic attack) 05/19/2014  . Fracture of coronoid process of ulna, left, closed 04/25/2014  . Ingrown toenail 01/15/2014  . Obesity (BMI 30-39.9) 10/08/2013  . OA (osteoarthritis) of knee 07/07/2013  . Chronic low back pain 05/09/2013  . Obstructive sleep apnea 05/09/2013  . GERD (gastroesophageal reflux disease) 05/09/2013  . Hiatal hernia 05/09/2013  . Fibromyalgia syndrome 05/09/2013  . Depression, major, in partial remission ([place]) 05/09/2013  . RLS (restless legs syndrome) 05/09/2013  . Diabetes mellitus with no complication ([place]) 01/00/7121  . High cholesterol 05/09/2013  . 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 Family history of colon cancer 05/09/2013  . Allergic rhinitis 05/09/2013  . Lateral meniscal tear 09/04/2012  . CAD (coronary artery disease) 09/27/2011  . HTN (hypertension) 09/27/2011  . Paroxysmal A-fib ([place]) 09/27/2011   Phillips Grout PT, DPT, GCS  , 10/23/2019, 5:02 PM  [place] MAIN [facility] SERVICES [address] [place], [place], [address] Phone: [phone]   Fax:  [phone]  Name: Morgan [name] MRN: [id number] Date of Birth: [birth year]

## 2019-10-29 ENCOUNTER — Ambulatory Visit: Payer: Medicare Other

## 2019-10-30 ENCOUNTER — Other Ambulatory Visit: Payer: Self-pay | Admitting: Cardiology

## 2019-10-30 ENCOUNTER — Other Ambulatory Visit: Payer: Self-pay | Admitting: Family Medicine

## 2019-11-10 ENCOUNTER — Other Ambulatory Visit: Payer: Self-pay | Admitting: Family Medicine

## 2019-11-10 ENCOUNTER — Ambulatory Visit: Payer: Medicare Other | Admitting: Gastroenterology

## 2019-11-10 NOTE — Telephone Encounter (Signed)
Last filled on 10/03/2019 #60 with 0 refill. LOV 02/25/2019  Next appointment on 01/27/2020 on [facility]

## 2019-11-19 ENCOUNTER — Telehealth: Payer: Self-pay | Admitting: *Deleted

## 2019-11-19 NOTE — Telephone Encounter (Signed)
Yes I recommend it for her.

## 2019-11-19 NOTE — Telephone Encounter (Signed)
Morgan [name] notified as instructed by telephone.  He will left [place] know.  She is currently out shopping.

## 2019-11-19 NOTE — Telephone Encounter (Signed)
Patient called stating that she can sign up to the get covid vaccine and wants to know if Dr. Diona Browner thinks that she should get it? Patient aware that Dr. Diona Browner is out of the office today.

## 2019-11-25 DIAGNOSIS — M5412 Radiculopathy, cervical region: Secondary | ICD-10-CM | POA: Diagnosis not present

## 2019-12-02 ENCOUNTER — Other Ambulatory Visit: Payer: Self-pay | Admitting: Family Medicine

## 2019-12-02 NOTE — Telephone Encounter (Signed)
Per DPR ; left v/m for pt that Dr Diona Browner refilled the gabapentin to [facility].

## 2019-12-02 NOTE — Telephone Encounter (Signed)
Last office visit 02/25/2019 for follow up chronic diarrhea.  Last refilled 04/04/2019 fjor #180 with 1 refill.  CPE scheduled for 01/27/2020.

## 2019-12-02 NOTE — Telephone Encounter (Signed)
See refill request.

## 2019-12-02 NOTE — Telephone Encounter (Signed)
Pt left v/m that Dr Mina Marble the surgeon had refilled gabapentin 300 mg before but so had Dr Diona Browner. Pt is out of med and request refill of Gabapentin 300 mg.

## 2019-12-05 ENCOUNTER — Ambulatory Visit: Payer: Medicare PPO | Attending: Internal Medicine

## 2019-12-05 DIAGNOSIS — Z23 Encounter for immunization: Secondary | ICD-10-CM | POA: Insufficient documentation

## 2019-12-05 NOTE — Progress Notes (Signed)
   Covid-19 Vaccination Clinic  Name:  Morgan [name]    MRN: [id number] DOB: 11/14/1940  12/05/2019  Morgan [name] was observed post Covid-19 immunization for 15 minutes without incidence. She was provided with Vaccine Information Sheet and instruction to access the V-Safe system.   Morgan [name] was instructed to call 911 with any severe reactions post vaccine: [name] Difficulty breathing  . Swelling of your face and throat  . A fast heartbeat  . A bad rash all over your body  . Dizziness and weakness    Immunizations Administered    Name Date Dose VIS Date Route   Pfizer COVID-19 Vaccine 12/05/2019  8:24 AM 0.3 mL 10/24/2019 Intramuscular   Manufacturer: [place]   Lot: [id number]   Morgan [name]: [id number]

## 2019-12-17 ENCOUNTER — Other Ambulatory Visit: Payer: Self-pay | Admitting: Family Medicine

## 2019-12-17 NOTE — Telephone Encounter (Signed)
Last office visit 02/25/2019 for HTN/Chronic Diarrhea.  Last refilled 11/10/2019 for #60 with no refills.  CPE scheduled for 01/27/2020.

## 2019-12-23 ENCOUNTER — Other Ambulatory Visit: Payer: Self-pay | Admitting: Family Medicine

## 2019-12-24 ENCOUNTER — Other Ambulatory Visit: Payer: Self-pay | Admitting: Family Medicine

## 2019-12-24 NOTE — Telephone Encounter (Signed)
Last office visit 02/25/2019 for HTN & Chronic Diarrhea.  Last refilled 12/02/2019 for #60 with 5 refills.  Phamacy is requesting 90 day supply.

## 2019-12-25 ENCOUNTER — Ambulatory Visit: Payer: Medicare PPO | Attending: Internal Medicine

## 2019-12-25 DIAGNOSIS — Z23 Encounter for immunization: Secondary | ICD-10-CM | POA: Insufficient documentation

## 2019-12-25 NOTE — Progress Notes (Signed)
   Covid-19 Vaccination Clinic  Name:  Morgan [name]    MRN: [id number] DOB: 1941/06/19  12/25/2019  [name] was observed post Covid-19 immunization for 30 minutes based on pre-vaccination screening without incidence. She was provided with Vaccine Information Sheet and instruction to access the V-Safe system.   [name] was instructed to call 911 with any severe reactions post vaccine: [name] Difficulty breathing  . Swelling of your face and throat  . A fast heartbeat  . A bad rash all over your body  . Dizziness and weakness    Immunizations Administered    Name Date Dose VIS Date Route   Pfizer COVID-19 Vaccine 12/25/2019  8:53 AM 0.3 mL 10/24/2019 Intramuscular   Manufacturer: [place]   Lot: [id number]   [place]: [id number]

## 2019-12-30 ENCOUNTER — Other Ambulatory Visit: Payer: Self-pay | Admitting: Family Medicine

## 2020-01-08 ENCOUNTER — Telehealth: Payer: Self-pay | Admitting: Gastroenterology

## 2020-01-08 NOTE — Telephone Encounter (Signed)
Left message for patient to call back to the office;  

## 2020-01-08 NOTE — Telephone Encounter (Signed)
Pt called to reschedule TIF.

## 2020-01-08 NOTE — Telephone Encounter (Signed)
amb ref has already been placed in Epic (08/2019)-

## 2020-01-09 ENCOUNTER — Telehealth: Payer: Self-pay | Admitting: *Deleted

## 2020-01-09 DIAGNOSIS — K219 Gastro-esophageal reflux disease without esophagitis: Secondary | ICD-10-CM

## 2020-01-09 DIAGNOSIS — R1310 Dysphagia, unspecified: Secondary | ICD-10-CM

## 2020-01-09 DIAGNOSIS — K449 Diaphragmatic hernia without obstruction or gangrene: Secondary | ICD-10-CM

## 2020-01-09 NOTE — Telephone Encounter (Signed)
Called and spoke with patient-patient has agreed to be scheduled for her COVID screening on 01/19/2020 at 8:00 am; patient has also been scheduled for her TIF procedure on 01/21/2020 at 1:15 pm; patient instructions have been printed and mailed and also sent to patient via MyChart; Patient advised to call back to the office at [phone] should questions/concerns arise;  Patient verbalized understanding of information/instructions; hospital orders have also been placed in Epic;

## 2020-01-09 NOTE — Telephone Encounter (Signed)
Please see alternative phone note for information

## 2020-01-09 NOTE — Telephone Encounter (Signed)

## 2020-01-11 NOTE — Progress Notes (Signed)
Virtual Visit via Telephone Note   This visit type was conducted due to national recommendations for restrictions regarding the COVID-19 Pandemic (e.g. social distancing) in an effort to limit this patient's exposure and mitigate transmission in our community.  Due to her co-morbid illnesses, this patient is at least at moderate risk for complications without adequate follow up.  This format is felt to be most appropriate for this patient at this time.  All issues noted in this document were discussed and addressed.  A limited physical exam was performed with this format.  Please refer to the patient's chart for her consent to telehealth for [facility].   Evaluation Performed:  Follow-up visit  This visit type was conducted due to national recommendations for restrictions regarding the COVID-19 Pandemic (e.g. social distancing).  This format is felt to be most appropriate for this patient at this time.  All issues noted in this document were discussed and addressed.  No physical exam was performed (except for noted visual exam findings with Video Visits).  Please refer to the patient's chart (MyChart message for video visits and phone note for telephone visits) for the patient's consent to telehealth for [facility].  Date:  01/12/2020   ID:  Morgan [name], DOB 27-Oct-1941, MRN [place]:[id number]  Patient Location:  Home  Provider location:   [place]  PCP:  Jinny Sanders, MD  Cardiologist:  Candee Furbish, MD  Sleep Medicine:  Fransico Him, MD Electrophysiologist:  None   Chief Complaint:  OSA  History of Present Illness:    Morgan [name] is a 79 y.o. female who presents via audio/video conferencing for a telehealth visit today.    Morgan [name] is a 79 y.o. female with a hx of OSA, HTN and obesity. She is doing well with her CPAP device and thinks that she has gotten used to it.  She tolerates the nasal mask and feels the pressure is adequate.  She goes to bed between 11pm and 1am  and does not wake up during the night and gets up between and 8-9am.  Since going on CPAP she feels rested in the am when she wakes up but in the afternoon tends to get sleepy but does not have to nap.  She has problems with dry mouth and uses a chin strap but thinks it is stretched out.  She does not have nasal dryness but it does run some.  She does not think that he snores.    The patient does not have symptoms concerning for COVID-19 infection (fever, chills, cough, or new shortness of breath).   Prior CV studies:   The following studies were reviewed today:  PAP compliance download from [place]  Past Medical History:  Diagnosis Date  . Angina   . Arthritis   . Atrial fibrillation (HCC)    ASPIRIN FOR BLOOD THINNER  . Atypical mole 09/22/2016  . Bulging discs    lumbar   . Cancer ([place])    melanoma  . Complication of anesthesia    pt states has choking sensation with ET tube   . Coronary artery disease   . Depression   . Diabetes mellitus    diet controlled/on meds  . Family history of breast cancer   . Family history of colon cancer   . Fibromyalgia   . GERD (gastroesophageal reflux disease)   . H/O hiatal hernia   . Headache(784.0)    "recurring"  . High cholesterol   . History of colon polyps   .  Hyperlipidemia   . Hypertension   . 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 Migraines    "til ~ 1980"  . PONV (postoperative nausea and vomiting)   . Restless leg syndrome   . Sciatic nerve pain    "from pinched nerve"  . Sleep apnea    uses CPAP  . Stroke [facility]) 2014   no deficits  . Weakness of right side of body    "I've had PT for it; they don't know what it's from".  CORTISONE INJECTION INTO BACK 08/30/12   Past Surgical History:  Procedure Laterality Date  . [place] STUDY N/A 12/29/2015   Procedure: [place] STUDY;  Surgeon: Manus Gunning, MD;  Location: [facility];  Service: Gastroenterology;  Laterality: N/A;  . CARPAL TUNNEL RELEASE Left 07/02/2015    Procedure: CARPAL TUNNEL RELEASE;  Surgeon: Frederik Pear, MD;  Location: [place];  Service: Orthopedics;  Laterality: Left;  . CATARACT EXTRACTION, BILATERAL Bilateral    Oct and Nov 2017  . CORONARY ANGIOPLASTY WITH STENT PLACEMENT  06/2011   "1"  . CORONARY ARTERY BYPASS GRAFT  2005   CABG X 2  . DILATION AND CURETTAGE OF UTERUS     "more than once"  . ELBOW ARTHROSCOPY Left 07/02/2015   Procedure: ARTHROSCOPY LEFT ELBOW WITH DEBRIDEMENT AND REMOVAL LOOSE BODY;  Surgeon: Frederik Pear, MD;  Location: [place];  Service: Orthopedics;  Laterality: Left;  . ESOPHAGEAL MANOMETRY N/A 12/29/2015   Procedure: ESOPHAGEAL MANOMETRY (EM);  Surgeon: Manus Gunning, MD;  [facility];  Service: Gastroenterology;  Laterality: N/A;  . ESOPHAGEAL MANOMETRY N/A 07/30/2019   Procedure: ESOPHAGEAL MANOMETRY (EM);  Surgeon: Yetta Flock, MD;  [facility];  Service: Gastroenterology;  Laterality: N/A;  . FRACTURE SURGERY  ~ 2005   nose  . KNEE ARTHROSCOPY  09/04/2012   Procedure: ARTHROSCOPY KNEE;  Surgeon: Gearlean Alf, MD;  Location[facility];  Service: Orthopedics;  Laterality: Right;  right knee arthroscopy with medial and lateral meniscus debridement  . 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 MOUTH SURGERY  2004   "bone replacement; had cadavear bones put in; face was collapsing"  . NASAL SEPTUM SURGERY  ~ 1986  . TOTAL KNEE ARTHROPLASTY Right 07/07/2013   Procedure: RIGHT TOTAL KNEE ARTHROPLASTY;  Surgeon: Gearlean Alf, MD;  Location[facility];  Service: Orthopedics;  Laterality: Right;     No outpatient medications have been marked as taking for the 01/12/20 encounter (Telemedicine) with Sueanne Margarita, MD.     Allergies:   Hydrocodone-acetaminophen, Percocet [oxycodone-acetaminophen], and Sulfa antibiotics   Social History   Tobacco Use  . Smoking status: Never Smoker  . Smokeless tobacco: Never Used   Substance Use Topics  . Alcohol use: Yes    Alcohol/week: 1.0 standard drinks    Types: 1 Glasses of wine per week    Comment: "occasionally drink wine"  . Drug use: No     Family Hx: The patient's family history includes Breast cancer (age of onset: 28) in her mother; Cancer (age of onset: 29) in her mother and sister; Colon cancer in her sister; Dementia in her sister; Diabetes in her sister; GER disease in her daughter; Heart disease in her brother; Heart disease (age of onset: 75) in her father; Hypertension in her brother, daughter, and sister. There is no history of Heart attack or Stroke.  ROS:   Please see the history  of present illness.     All other systems reviewed and are negative.   Labs/Other Tests and Data Reviewed:    Recent Labs: No results found for requested labs within last 8760 hours.   Recent Lipid Panel Lab Results  Component Value Date/Time   CHOL 110 12/23/2018 12:03 PM   TRIG 72.0 12/23/2018 12:03 PM   HDL 45.60 12/23/2018 12:03 PM   CHOLHDL 2 12/23/2018 12:03 PM   LDLCALC 50 12/23/2018 12:03 PM    Wt Readings from Last 3 Encounters:  01/12/20 154 lb (69.9 kg)  09/04/19 155 lb (70.3 kg)  08/08/19 155 lb (70.3 kg)     Objective:    Vital Signs:  BP (!) 142/79   Pulse 67   Ht 5\' 2"  (1.575 m)   Wt 154 lb (69.9 kg)   BMI 28.17 kg/m     ASSESSMENT & PLAN:    1.  OSA - The patient is tolerating PAP therapy well without any problems. The PAP download was reviewed today and showed an AHI of 1.6/hr on 14 cm H2O with 93% compliance in using more than 4 hours nightly.  The patient has been using and benefiting from PAP use and will continue to benefit from therapy. I will order her a new chin strap to help with mouth dryness.  2.  HTN -BP controlled -continue Toprol Xl 100mg  daily  3.  Obesity -I have encouraged her to get into a routine exercise program and cut back on carbs and portions.    COVID-19 Education: The signs and symptoms of  COVID-19 were discussed with the patient and how to seek care for testing (follow up with PCP or arrange E-visit).  The importance of social distancing was discussed today.  Patient Risk:   After full review of this patient's clinical status, I feel that they are at least moderate risk at this time.  Time:   Today, I have spent 20 minutes on Telemedicine discussing medical problems including OSA, HTN, obesity and reviewing patient's chart including PAP compliance download from [place] .  Medication Adjustments/Labs and Tests Ordered: Current medicines are reviewed at length with the patient today.  Concerns regarding medicines are outlined above.  Tests Ordered: No orders of the defined types were placed in this encounter.  Medication Changes: No orders of the defined types were placed in this encounter.   Disposition:  Follow up in 1 year(s)  Signed, Fransico Him, MD  01/12/2020 8:57 AM    [facility]

## 2020-01-12 ENCOUNTER — Encounter: Payer: Self-pay | Admitting: Cardiology

## 2020-01-12 ENCOUNTER — Telehealth: Payer: Self-pay | Admitting: *Deleted

## 2020-01-12 ENCOUNTER — Telehealth (INDEPENDENT_AMBULATORY_CARE_PROVIDER_SITE_OTHER): Payer: Medicare PPO | Admitting: Cardiology

## 2020-01-12 ENCOUNTER — Other Ambulatory Visit: Payer: Self-pay

## 2020-01-12 VITALS — BP 142/79 | HR 67 | Ht 62.0 in | Wt 154.0 lb

## 2020-01-12 DIAGNOSIS — I10 Essential (primary) hypertension: Secondary | ICD-10-CM | POA: Diagnosis not present

## 2020-01-12 DIAGNOSIS — G4733 Obstructive sleep apnea (adult) (pediatric): Secondary | ICD-10-CM

## 2020-01-12 DIAGNOSIS — Z6828 Body mass index (BMI) 28.0-28.9, adult: Secondary | ICD-10-CM | POA: Diagnosis not present

## 2020-01-12 DIAGNOSIS — E669 Obesity, unspecified: Secondary | ICD-10-CM

## 2020-01-12 NOTE — Patient Instructions (Addendum)
Medication Instructions:  Your physician recommends that you continue on your current medications as directed. Please refer to the Current Medication list given to you today.  *If you need a refill on your cardiac medications before your next appointment, please call your pharmacy*   Follow-Up: At [facility], you and your health needs are our priority.  As part of our continuing mission to provide you with exceptional heart care, we have created designated Provider Care Teams.  These Care Teams include your primary Cardiologist (physician) and Advanced Practice Providers (APPs -  Physician Assistants and Nurse Practitioners) who all work together to provide you with the care you need, when you need it.  We recommend signing up for the patient portal called "MyChart".  Sign up information is provided on this After Visit Summary.  MyChart is used to connect with patients for Virtual Visits (Telemedicine).  Patients are able to view lab/test results, encounter notes, upcoming appointments, etc.  Non-urgent messages can be sent to your provider as well.   To learn more about what you can do with MyChart, go to [email or web address].    Your next appointment:   1 year(s)  The format for your next appointment:   Either In Person or Virtual  Provider:   Fransico Him, MD   Other Instructions Dr. Theodosia Blender sleep coordinator, Gae Bon, will be in touch with you regarding new CPAP supplies.

## 2020-01-12 NOTE — Telephone Encounter (Signed)
Order placed to [employer] via community message 

## 2020-01-12 NOTE — Telephone Encounter (Signed)
-----   Message from Antonieta Iba, RN sent at 01/12/2020  9:14 AM EST ----- From Dr. Radford Pax:   "please send an order to [facility] for chin strap and supplies - followup with me in 1 year"  I have placed recall for follow up.  Thanks!

## 2020-01-14 ENCOUNTER — Telehealth: Payer: Self-pay | Admitting: Gastroenterology

## 2020-01-14 ENCOUNTER — Telehealth: Payer: Self-pay

## 2020-01-14 NOTE — Telephone Encounter (Signed)
Okay to change to higher dose tablet of venlafaxine.  If she needs the additional alprazolam.. she can pay out pocket for it as it is cheap.. maybe use [facility].

## 2020-01-14 NOTE — Telephone Encounter (Signed)
Patient called to check on a letter from insurance that she said she dropped off about 2 to 3 weeks ago here, in regards to Venlafaxine coverage. I did not see any notes on this in the chart. I asked patient if she had a copy of this but she did not. I spoke with Butch Penny and told patient that it was most likely insurance saying they would not cover Venlafaxine due to her taking 37.5 mg 1 capsule BID daily and there is a higher dose available 75 mg to take 1 capsule daily. Patient wanted to know if that is what Dr Diona Browner suggests.   Patient states also received letter on Clonazepam and it basically said they would not cover 2 tablets daily, only 1 tablet daily. Patient said she needs 2 tablets and could not take just 1 tablet daily. How should patient proceed with these?

## 2020-01-15 ENCOUNTER — Telehealth: Payer: Self-pay | Admitting: Gastroenterology

## 2020-01-15 MED ORDER — VENLAFAXINE HCL ER 75 MG PO CP24: 75.0000 mg | ORAL_CAPSULE | Freq: Every day | ORAL | 5 refills | Status: DC

## 2020-01-15 NOTE — Telephone Encounter (Signed)
Called and spoke with patient's husband-husband informed of information requested from patient-additional questions answered;  Patient's husband advised to call back to the office at [phone] should questions/concerns arise;  Patient's husband verbalized understanding of information/instructions;

## 2020-01-15 NOTE — Telephone Encounter (Signed)
Morgan [name] notified as instructed by telephone.  I recommended that she try and fill the clonazepam as prescribed taking 2 tablets daily and see if we are able to do a PA to get that approved through her insurance.  Otherwise I advised she could use a GoodRx coupon and get the clonazepam at [facility] paying out of pocket $11.17 for #60/month.  Rx for Venlafaxine XR 75 mg sent to [facility] on [address].  Patient states understanding.

## 2020-01-15 NOTE — Telephone Encounter (Signed)
Patient called is wondering if she would need to stay overnight at the hospital.

## 2020-01-15 NOTE — Addendum Note (Signed)
Addended by: Carter Kitten on: 01/15/2020 10:20 AM   Modules accepted: Orders

## 2020-01-15 NOTE — Telephone Encounter (Signed)
Called and spoke with patient-patient "walked through" being able to find her instructions on MyChart-patient reports she has not received the information packet that was sent to her home address; patient reports now that she "can get in her letters I can read the information"; Patient advised to call back to the office at [phone] should questions/concerns arise; Patient verbalized understanding of information/instructions;

## 2020-01-19 ENCOUNTER — Ambulatory Visit (INDEPENDENT_AMBULATORY_CARE_PROVIDER_SITE_OTHER): Payer: Medicare PPO

## 2020-01-19 ENCOUNTER — Other Ambulatory Visit: Payer: Self-pay

## 2020-01-19 ENCOUNTER — Ambulatory Visit: Payer: Medicare Other

## 2020-01-19 ENCOUNTER — Telehealth: Payer: Self-pay | Admitting: Family Medicine

## 2020-01-19 ENCOUNTER — Other Ambulatory Visit (HOSPITAL_COMMUNITY)
Admission: RE | Admit: 2020-01-19 | Discharge: 2020-01-19 | Disposition: A | Discharge status: Home / Self Care | Payer: Medicare PPO | Source: Ambulatory Visit | Attending: Gastroenterology | Admitting: Gastroenterology

## 2020-01-19 ENCOUNTER — Other Ambulatory Visit (INDEPENDENT_AMBULATORY_CARE_PROVIDER_SITE_OTHER): Payer: Medicare PPO

## 2020-01-19 DIAGNOSIS — Z0000 Encounter for general adult medical examination without abnormal findings: Secondary | ICD-10-CM | POA: Diagnosis not present

## 2020-01-19 DIAGNOSIS — E7800 Pure hypercholesterolemia, unspecified: Secondary | ICD-10-CM

## 2020-01-19 DIAGNOSIS — Z20822 Contact with and (suspected) exposure to covid-19: Secondary | ICD-10-CM | POA: Insufficient documentation

## 2020-01-19 DIAGNOSIS — E119 Type 2 diabetes mellitus without complications: Secondary | ICD-10-CM | POA: Diagnosis not present

## 2020-01-19 DIAGNOSIS — Z01812 Encounter for preprocedural laboratory examination: Secondary | ICD-10-CM | POA: Diagnosis not present

## 2020-01-19 LAB — MICROALBUMIN / CREATININE URINE RATIO
Creatinine,U: 104.9 mg/dL
Microalb Creat Ratio: 4.4 mg/g (ref 0.0–30.0)
Microalb, Ur: 4.6 mg/dL — ABNORMAL HIGH (ref 0.0–1.9)

## 2020-01-19 LAB — COMPREHENSIVE METABOLIC PANEL
ALT: 21 U/L (ref 0–35)
AST: 22 U/L (ref 0–37)
Albumin: 3.9 g/dL (ref 3.5–5.2)
Alkaline Phosphatase: 64 U/L (ref 39–117)
BUN: 23 mg/dL (ref 6–23)
CO2: 31 mEq/L (ref 19–32)
Calcium: 9.4 mg/dL (ref 8.4–10.5)
Chloride: 105 mEq/L (ref 96–112)
Creatinine, Ser: 0.88 mg/dL (ref 0.40–1.20)
GFR: 62.17 mL/min (ref >60.00)
Glucose, Bld: 103 mg/dL — ABNORMAL HIGH (ref 70–99)
Potassium: 4.3 mEq/L (ref 3.5–5.1)
Sodium: 142 mEq/L (ref 135–145)
Total Bilirubin: 0.4 mg/dL (ref 0.2–1.2)
Total Protein: 6.3 g/dL (ref 6.0–8.3)

## 2020-01-19 LAB — LIPID PANEL
Cholesterol: 125 mg/dL (ref 0–200)
HDL: 50.3 mg/dL (ref >39.00)
LDL Cholesterol: 61 mg/dL (ref 0–99)
NonHDL: 74.92
Total CHOL/HDL Ratio: 2
Triglycerides: 71 mg/dL (ref 0.0–149.0)
VLDL: 14.2 mg/dL (ref 0.0–40.0)

## 2020-01-19 LAB — HM DIABETES FOOT EXAM

## 2020-01-19 LAB — SARS CORONAVIRUS 2 (TAT 6-24 HRS): SARS Coronavirus 2: NEGATIVE

## 2020-01-19 LAB — HEMOGLOBIN A1C: Hgb A1c MFr Bld: 6.7 % — ABNORMAL HIGH (ref 4.6–6.5)

## 2020-01-19 NOTE — Progress Notes (Signed)
PCP notes:  Health Maintenance: Foot exam- due   Abnormal Screenings: none   Patient concerns: none   Nurse concerns: none   Next PCP appt: 01/27/2020 @ 10 am

## 2020-01-19 NOTE — Patient Instructions (Signed)
Morgan [name] , Thank you for taking time to come for your Medicare Wellness Visit. I appreciate your ongoing commitment to your health goals. Please review the following plan we discussed and let me know if I can assist you in the future.   Screening recommendations/referrals: Colonoscopy: Up to date, completed 06/30/2019 Mammogram: Up to date, completed 07/03/2019 Bone Density: Up to date, completed 07/03/2019 Recommended yearly ophthalmology/optometry visit for glaucoma screening and checkup Recommended yearly dental visit for hygiene and checkup  Vaccinations: Influenza vaccine: Up to date, completed 07/17/2019 Pneumococcal vaccine: Completed series Tdap vaccine: Up to date, completed 06/25/2018 Shingles vaccine: administered 03/05/2018    Advanced directives: Please bring a copy of your POA (Power of [place]) and/or Living Will to your next appointment.   Conditions/risks identified: diabetes, hypertension, hypercholesterolemia  Next appointment: 01/27/2020 @ 10 am    Preventive Care 78 Years and Older, Female Preventive care refers to lifestyle choices and visits with your health care provider that can promote health and wellness. What does preventive care include?  A yearly physical exam. This is also called an annual well check.  Dental exams once or twice a year.  Routine eye exams. Ask your health care provider how often you should have your eyes checked.  Personal lifestyle choices, including:  Daily care of your teeth and gums.  Regular physical activity.  Eating a healthy diet.  Avoiding tobacco and drug use.  Limiting alcohol use.  Practicing safe sex.  Taking low-dose aspirin every day.  Taking vitamin and mineral supplements as recommended by your health care provider. What happens during an annual well check? The services and screenings done by your health care provider during your annual well check will depend on your age, overall health, lifestyle risk  factors, and family history of disease. Counseling  Your health care provider may ask you questions about your:  Alcohol use.  Tobacco use.  Drug use.  Emotional well-being.  Home and relationship well-being.  Sexual activity.  Eating habits.  History of falls.  Memory and ability to understand (cognition).  Work and work Statistician.  Reproductive health. Screening  You may have the following tests or measurements:  Height, weight, and BMI.  Blood pressure.  Lipid and cholesterol levels. These may be checked every 5 years, or more frequently if you are over 78 years old.  Skin check.  Lung cancer screening. You may have this screening every year starting at age 78 if you have a 30-pack-year history of smoking and currently smoke or have quit within the past 15 years.  Fecal occult blood test (FOBT) of the stool. You may have this test every year starting at age 78.  Flexible sigmoidoscopy or colonoscopy. You may have a sigmoidoscopy every 5 years or a colonoscopy every 10 years starting at age 78.  Hepatitis C blood test.  Hepatitis B blood test.  Sexually transmitted disease (STD) testing.  Diabetes screening. This is done by checking your blood sugar (glucose) after you have not eaten for a while (fasting). You may have this done every 1-3 years.  Bone density scan. This is done to screen for osteoporosis. You may have this done starting at age 78.  Mammogram. This may be done every 1-2 years. Talk to your health care provider about how often you should have regular mammograms. Talk with your health care provider about your test results, treatment options, and if necessary, the need for more tests. Vaccines  Your health care provider may recommend certain vaccines,  such as:  Influenza vaccine. This is recommended every year.  Tetanus, diphtheria, and acellular pertussis (Tdap, Td) vaccine. You may need a Td booster every 10 years.  Zoster vaccine. You  may need this after age 78  Pneumococcal 13-valent conjugate (PCV13) vaccine. One dose is recommended after age 78.  Pneumococcal polysaccharide (PPSV23) vaccine. One dose is recommended after age 78. Talk to your health care provider about which screenings and vaccines you need and how often you need them. This information is not intended to replace advice given to you by your health care provider. Make sure you discuss any questions you have with your health care provider. Document Released: 11/26/2015 Document Revised: 07/19/2016 Document Reviewed: 08/31/2015 Elsevier Interactive Patient Education  2017 [place] Prevention in the Home Falls can cause injuries. They can happen to people of all ages. There are many things you can do to make your home safe and to help prevent falls. What can I do on the outside of my home?  Regularly fix the edges of walkways and driveways and fix any cracks.  Remove anything that might make you trip as you walk through a door, such as a raised step or threshold.  Trim any bushes or trees on the path to your home.  Use bright outdoor lighting.  Clear any walking paths of anything that might make someone trip, such as rocks or tools.  Regularly check to see if handrails are loose or broken. Make sure that both sides of any steps have handrails.  Any raised decks and porches should have guardrails on the edges.  Have any leaves, snow, or ice cleared regularly.  Use sand or salt on walking paths during winter.  Clean up any spills in your garage right away. This includes oil or grease spills. What can I do in the bathroom?  Use night lights.  Install grab bars by the toilet and in the tub and shower. Do not use towel bars as grab bars.  Use non-skid mats or decals in the tub or shower.  If you need to sit down in the shower, use a plastic, non-slip stool.  Keep the floor dry. Clean up any water that spills on the floor as soon as  it happens.  Remove soap buildup in the tub or shower regularly.  Attach bath mats securely with double-sided non-slip rug tape.  Do not have throw rugs and other things on the floor that can make you trip. What can I do in the bedroom?  Use night lights.  Make sure that you have a light by your bed that is easy to reach.  Do not use any sheets or blankets that are too big for your bed. They should not hang down onto the floor.  Have a firm chair that has side arms. You can use this for support while you get dressed.  Do not have throw rugs and other things on the floor that can make you trip. What can I do in the kitchen?  Clean up any spills right away.  Avoid walking on wet floors.  Keep items that you use a lot in easy-to-reach places.  If you need to reach something above you, use a strong step stool that has a grab bar.  Keep electrical cords out of the way.  Do not use floor polish or wax that makes floors slippery. If you must use wax, use non-skid floor wax.  Do not have throw rugs and other things on  the floor that can make you trip. What can I do with my stairs?  Do not leave any items on the stairs.  Make sure that there are handrails on both sides of the stairs and use them. Fix handrails that are broken or loose. Make sure that handrails are as long as the stairways.  Check any carpeting to make sure that it is firmly attached to the stairs. Fix any carpet that is loose or worn.  Avoid having throw rugs at the top or bottom of the stairs. If you do have throw rugs, attach them to the floor with carpet tape.  Make sure that you have a light switch at the top of the stairs and the bottom of the stairs. If you do not have them, ask someone to add them for you. What else can I do to help prevent falls?  Wear shoes that:  Do not have high heels.  Have rubber bottoms.  Are comfortable and fit you well.  Are closed at the toe. Do not wear sandals.  If  you use a stepladder:  Make sure that it is fully opened. Do not climb a closed stepladder.  Make sure that both sides of the stepladder are locked into place.  Ask someone to hold it for you, if possible.  Clearly mark and make sure that you can see:  Any grab bars or handrails.  First and last steps.  Where the edge of each step is.  Use tools that help you move around (mobility aids) if they are needed. These include:  Canes.  Walkers.  Scooters.  Crutches.  Turn on the lights when you go into a dark area. Replace any light bulbs as soon as they burn out.  Set up your furniture so you have a clear path. Avoid moving your furniture around.  If any of your floors are uneven, fix them.  If there are any pets around you, be aware of where they are.  Review your medicines with your doctor. Some medicines can make you feel dizzy. This can increase your chance of falling. Ask your doctor what other things that you can do to help prevent falls. This information is not intended to replace advice given to you by your health care provider. Make sure you discuss any questions you have with your health care provider. Document Released: 08/26/2009 Document Revised: 04/06/2016 Document Reviewed: 12/04/2014 Elsevier Interactive Patient Education  2017 [employer].

## 2020-01-19 NOTE — Progress Notes (Signed)
Subjective:   Morgan [name] is a 79 y.o. female who presents for Medicare Annual (Subsequent) preventive examination.  Review of Systems: N/A   This visit is being conducted through telemedicine via telephone at the nurse health advisor's home address due to the COVID-19 pandemic. This patient has given me verbal consent via doximity to conduct this visit, patient states they are participating from their home address. Patient and myself are on the telephone call. There is no referral for this visit. Some vital signs may be absent or patient reported.    Patient identification: identified by name, DOB, and current address   Cardiac Risk Factors include: advanced age (>44men, >47 women);diabetes mellitus;hypertension;Other (see comment), Risk factor comments: hypercholesterolemia     Objective:     Vitals: There were no vitals taken for this visit.  There is no height or weight on file to calculate BMI.  Advanced Directives 01/19/2020 12/23/2018 09/30/2018 12/07/2017 06/04/2017 03/21/2017 11/14/2016  Does Patient Have a Medical Advance Directive? Yes Yes Yes Yes Yes Yes Yes  Type of Paramedic of [place];Living will [place];Living will [place];Living will [place];Living will [place];Living will Living will [place];Living will  Does patient want to make changes to medical advance directive? - - - - No - Patient declined - -  Copy of [place] in Chart? No - copy requested Yes - validated most recent copy scanned in chart (See row information) - No - copy requested Yes - No - copy requested  Pre-existing out of facility DNR order (yellow form or pink MOST form) - - - - - - -    Tobacco Social History   Tobacco Use  Smoking Status Never Smoker  Smokeless Tobacco Never Used     Counseling given: Not Answered   Clinical Intake:  Pre-visit  preparation completed: Yes  Pain : No/denies pain     Nutritional Risks: Nausea/ vomitting/ diarrhea(diarrhea sometimes, takes immodium) Diabetes: Yes CBG done?: No Did pt. bring in CBG monitor from home?: No  How often do you need to have someone help you when you read instructions, pamphlets, or other written materials from your doctor or pharmacy?: 1 - Never What is the last grade level you completed in school?: 12th  Interpreter Needed?: No  Information entered by :: CJohnson, LPN  Past Medical History:  Diagnosis Date  . Angina   . Arthritis   . Atrial fibrillation (HCC)    ASPIRIN FOR BLOOD THINNER  . Atypical mole 09/22/2016  . Bulging discs    lumbar   . Cancer ([place])    melanoma  . Complication of anesthesia    pt states has choking sensation with ET tube   . Coronary artery disease   . Depression   . Diabetes mellitus    diet controlled/on meds  . Family history of breast cancer   . Family history of colon cancer   . Fibromyalgia   . GERD (gastroesophageal reflux disease)   . H/O hiatal hernia   . Headache(784.0)    "recurring"  . High cholesterol   . History of colon polyps   . Hyperlipidemia   . Hypertension   . 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 Migraines    "til ~ 1980"  . PONV (postoperative nausea and vomiting)   . Restless leg syndrome   . Sciatic nerve pain    "from pinched nerve"  . Sleep apnea  uses CPAP  . Stroke [facility]) 2014   no deficits  . Weakness of right side of body    "I've had PT for it; they don't know what it's from".  CORTISONE INJECTION INTO BACK 08/30/12   Past Surgical History:  Procedure Laterality Date  . [place] STUDY N/A 12/29/2015   Procedure: [place] STUDY;  Surgeon: Manus Gunning, MD;  [facility]: [facility];  Service: Gastroenterology;  Laterality: N/A;  . CARPAL TUNNEL RELEASE Left 07/02/2015   Procedure: CARPAL TUNNEL RELEASE;  Surgeon: Frederik Pear, MD;  Location: [place];  Service:  Orthopedics;  Laterality: Left;  . CATARACT EXTRACTION, BILATERAL Bilateral    Oct and Nov 2017  . CORONARY ANGIOPLASTY WITH STENT PLACEMENT  06/2011   "1"  . CORONARY ARTERY BYPASS GRAFT  2005   CABG X 2  . DILATION AND CURETTAGE OF UTERUS     "more than once"  . ELBOW ARTHROSCOPY Left 07/02/2015   Procedure: ARTHROSCOPY LEFT ELBOW WITH DEBRIDEMENT AND REMOVAL LOOSE BODY;  Surgeon: Frederik Pear, MD;  Location: [place];  Service: Orthopedics;  Laterality: Left;  . ESOPHAGEAL MANOMETRY N/A 12/29/2015   Procedure: ESOPHAGEAL MANOMETRY (EM);  Surgeon: Manus Gunning, MD;  [facility];  Service: Gastroenterology;  Laterality: N/A;  . ESOPHAGEAL MANOMETRY N/A 07/30/2019   Procedure: ESOPHAGEAL MANOMETRY (EM);  Surgeon: Yetta Flock, MD;  [facility];  Service: Gastroenterology;  Laterality: N/A;  . FRACTURE SURGERY  ~ 2005   nose  . KNEE ARTHROSCOPY  09/04/2012   Procedure: ARTHROSCOPY KNEE;  Surgeon: Gearlean Alf, MD;  [facility];  Service: Orthopedics;  Laterality: Right;  right knee arthroscopy with medial and lateral meniscus debridement  . 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 MOUTH SURGERY  2004   "bone replacement; had cadavear bones put in; face was collapsing"  . NASAL SEPTUM SURGERY  ~ 1986  . TOTAL KNEE ARTHROPLASTY Right 07/07/2013   Procedure: RIGHT TOTAL KNEE ARTHROPLASTY;  Surgeon: Gearlean Alf, MD;  [facility];  Service: Orthopedics;  Laterality: Right;   Family History  Problem Relation Age of Onset  . Cancer Mother 42       breast cancer  . Breast cancer Mother 16  . Heart disease Father 20       sudden onset due to CAD  . Cancer Sister 10       colon  . Hypertension Sister   . Dementia Sister   . Diabetes Sister   . Colon cancer Sister   . GER disease Daughter   . Hypertension Daughter   . Heart disease Brother   . Hypertension Brother   . Heart attack Neg Hx   . Stroke  Neg Hx    Social History   Socioeconomic History  . Marital status: Married    Spouse name: Not on file  . Number of children: 2  . Years of education: college  . Highest education level: Not on file  Occupational History  . Occupation: Retired   Tobacco Use  . Smoking status: Never Smoker  . Smokeless tobacco: Never Used  Substance and Sexual Activity  . Alcohol use: Yes    Alcohol/week: 1.0 standard drinks    Types: 1 Glasses of wine per week    Comment: "occasionally drink wine"  . Drug use: No  . Sexual activity: Yes  Other Topics Concern  . Not on file  Social History Narrative   Drinks 1 cup of coffee a day    Social Determinants of Health   Financial Resource Strain: Low Risk   . Difficulty of Paying Living Expenses: Not hard at all  Food Insecurity: No Food Insecurity  . Worried About Charity fundraiser in the Last Year: Never true  . Ran Out of Food in the Last Year: Never true  Transportation Needs: No Transportation Needs  . Lack of Transportation (Medical): No  . Lack of Transportation (Non-Medical): No  Physical Activity: Inactive  . Days of Exercise per Week: 0 days  . Minutes of Exercise per Session: 0 min  Stress: No Stress Concern Present  . Feeling of Stress : Not at all  Social Connections:   . Frequency of Communication with Friends and Family: Not on file  . Frequency of Social Gatherings with Friends and Family: Not on file  . Attends Religious Services: Not on file  . Active Member of Clubs or Organizations: Not on file  . Attends Archivist Meetings: Not on file  . Marital Status: Not on file    Outpatient Encounter Medications as of 01/19/2020  Medication Sig  . aspirin EC 81 MG tablet Take 1 tablet (81 mg total) by mouth daily. (Patient taking differently: Take 81 mg by mouth at bedtime. )  . atorvastatin (LIPITOR) 80 MG tablet TAKE 1 TABLET BY MOUTH EVERY DAY (Patient taking differently: Take 80 mg by mouth daily. )  . BLINK  TEARS 0.25 % SOLN Place 1 drop into both eyes 3 (three) times daily as needed (dry/irritated eyes.).  [name] calcium-vitamin D (OSCAL WITH D) 500-200 MG-UNIT tablet Take 1 tablet by mouth 2 (two) times daily.  [name] CARAFATE 1 GM/10ML suspension TAKE 10 MLS (1 G TOTAL) BY MOUTH EVERY 6 (SIX) HOURS AS NEEDED. (Patient taking differently: Take 1 g by mouth every 6 (six) hours as needed (GERD). )  . cetirizine (ZYRTEC) 10 MG tablet Take 10 mg by mouth daily.   . chlorthalidone (HYGROTON) 25 MG tablet TAKE 1 TABLET BY MOUTH EVERY DAY (Patient taking differently: Take 25 mg by mouth daily. )  . CINNAMON PO Take 1,000 mg by mouth in the morning and at bedtime.  . clonazePAM (KLONOPIN) 1 MG tablet TAKE 2 TABLETS BY MOUTH AT BEDTIME (Patient taking differently: Take 2 mg by mouth at bedtime. )  . DEXILANT 60 MG capsule TAKE 1 CAPSULE BY MOUTH EVERY DAY (Patient taking differently: Take 60 mg by mouth daily before breakfast. )  . gabapentin (NEURONTIN) 300 MG capsule TAKE 2 CAPSULES (600 MG TOTAL) BY MOUTH AT BEDTIME.  . Insulin Pen Needle (NOVOFINE) 32G X 6 MM MISC USE TO INJECT VICTOZA DAILY. DX: E11.9  . Lancet Device MISC Use to check blood sugar twice daily.  . Lancets (FREESTYLE) lancets Use to check blood sugar twice daily  . meloxicam (MOBIC) 15 MG tablet Take 15 mg by mouth daily. Reported on 12/23/2015  . metFORMIN (GLUCOPHAGE-XR) 500 MG 24 hr tablet TAKE 2 TABLETS BY MOUTH TWICE A DAY (Patient taking differently: Take 1,000 mg by mouth in the morning and at bedtime. )  . metoprolol succinate (TOPROL-XL) 100 MG 24 hr tablet TAKE 1 TABLET (100 MG TOTAL) BY MOUTH DAILY. TAKE WITH OR IMMEDIATELY FOLLOWING A MEAL. (Patient taking differently: Take 100 mg by mouth every evening. Take with or immediately following a meal.)  . Multiple Vitamins-Minerals (PRESERVISION AREDS PO) Take 1 tablet by mouth 2 (two) times  daily. Reported on 12/23/2015  . MYRBETRIQ 25 MG TB24 tablet TAKE 1 TABLET BY MOUTH EVERY DAY (Patient  taking differently: Take 25 mg by mouth daily. )  . nitroGLYCERIN (NITROSTAT) 0.4 MG SL tablet PLACE 1 TABLET (0.4 MG TOTAL) UNDER THE TONGUE EVERY 5 (FIVE) MINUTES AS NEEDED FOR CHEST PAIN (Patient taking differently: Place 0.4 mg under the tongue every 5 (five) minutes x 3 doses as needed for chest pain. )  . ONETOUCH ULTRA test strip USE TO CHECK BLOOD SUGAR TWICE DAILY  . venlafaxine XR (EFFEXOR XR) 75 MG 24 hr capsule Take 1 capsule (75 mg total) by mouth daily with breakfast.  . VICTOZA 18 MG/3ML SOPN INJECT 1.8 MG INTO THE SKIN DAILY. (Patient taking differently: Inject 1.2 mg into the skin daily. )   No facility-administered encounter medications on file as of 01/19/2020.    Activities of Daily Living In your present state of health, do you have any difficulty performing the following activities: 01/19/2020  Hearing? N  Vision? N  Difficulty concentrating or making decisions? N  Walking or climbing stairs? N  Dressing or bathing? N  Doing errands, shopping? N  Preparing Food and eating ? N  Using the Toilet? N  In the past six months, have you accidently leaked urine? N  Do you have problems with loss of bowel control? Y  Comment does not wear any protection  Managing your Medications? N  Managing your Finances? N  Housekeeping or managing your Housekeeping? N  Some recent data might be hidden    Patient Care Team: Jinny Sanders, MD as PCP - General (Family Medicine) Jerline Pain, MD as PCP - Cardiology (Cardiology) Sueanne Margarita, MD as PCP - Sleep Medicine (Sleep Medicine) Monna Fam, MD as Consulting Physician (Ophthalmology)    Assessment:   This is a routine wellness examination for Tima.  Exercise Activities and Dietary recommendations Current Exercise Habits: The patient does not participate in regular exercise at present, Exercise limited by: None identified  Goals    . Increase physical activity     As scheduled, I will continue to attend physical  therapy for 45 minutes 2 days to per week. Additionally, I will continue to do PT exercises for 30 minutes daily.     . Patient Stated     01/19/2020, I will try to increase the amount of exercise to about 2-3 days a week.        Fall Risk Fall Risk  01/19/2020 12/23/2018 12/07/2017 11/14/2016 06/19/2016  Falls in the past year? 0 1 No No Yes  Number falls in past yr: 0 0 - - 2 or more  Injury with Fall? 0 1 - - No  Risk Factor Category  - - - - -  Risk for fall due to : Medication side effect - - - History of fall(s)  Follow up Falls evaluation completed;Falls prevention discussed - - - Falls prevention discussed   Is the patient's home free of loose throw rugs in walkways, pet beds, electrical cords, etc?   yes      Grab bars in the bathroom? yes      Handrails on the stairs?   yes      Adequate lighting?   yes  Timed Get Up and Go performed: N/A  Depression Screen PHQ 2/9 Scores 01/19/2020 12/23/2018 12/07/2017 11/14/2016  PHQ - 2 Score 1 0 1 0  PHQ- 9 Score 1 0 3 -     Cognitive  Function MMSE - Mini Mental State Exam 01/19/2020 12/23/2018 12/07/2017 11/14/2016  Orientation to time 5 5 5 5   Orientation to Place 5 5 5 5   Registration 3 3 3 3   Attention/ Calculation 5 0 0 0  Recall 3 3 3 3   Language- name 2 objects - 0 0 0  Language- repeat 1 1 1 1   Language- follow 3 step command - 3 3 3   Language- read & follow direction - 0 0 0  Write a sentence - 0 0 0  Copy design - 0 0 0  Total score - 20 20 20   Mini Cog  Mini-Cog screen was completed. Maximum score is 22. A value of 0 denotes this part of the MMSE was not completed or the patient failed this part of the Mini-Cog screening.       Immunization History  Administered Date(s) Administered  . Fluad Quad(high Dose 65+) 07/17/2019  . Influenza, High Dose Seasonal PF 08/04/2016, 08/21/2017, 08/07/2018  . Influenza,inj,Quad PF,6+ Mos 07/23/2014, 08/10/2015  . PFIZER SARS-COV-2 Vaccination 12/05/2019, 12/25/2019  . Pneumococcal  Conjugate-13 10/27/2014  . Pneumococcal Polysaccharide-23 09/02/2007, 10/03/2013  . Td 11/14/2007  . Tdap 06/25/2018  . Zoster 02/09/2010  . Zoster Recombinat (Shingrix) 03/05/2018    Qualifies for Shingles Vaccine: administered 03/05/2018  Screening Tests Health Maintenance  Topic Date Due  . FOOT EXAM  06/21/2019  . HEMOGLOBIN A1C  06/23/2019  . URINE MICROALBUMIN  12/24/2019  . OPHTHALMOLOGY EXAM  01/26/2020  . COLONOSCOPY  06/29/2020  . MAMMOGRAM  07/02/2021  . TETANUS/TDAP  06/25/2028  . INFLUENZA VACCINE  Completed  . DEXA SCAN  Completed  . PNA vac Low Risk Adult  Completed    Cancer Screenings: Lung: Low Dose CT Chest recommended if Age 53-80 years, 30 pack-year currently smoking OR have quit w/in 15 years. Patient does not qualify. Breast:  Up to date on Mammogram? Yes, completed 07/03/2019   Up to date of Bone Density/Dexa? Yes, completed 07/03/2019 Colorectal: completed 06/30/2019  Additional Screenings:  Hepatitis C Screening: N/A     Plan:   Patient will try to increase her amount of exercise to around 2 to 3 days a week.   I have personally reviewed and noted the following in the patient's chart:   . Medical and social history . Use of alcohol, tobacco or illicit drugs  . Current medications and supplements . Functional ability and status . Nutritional status . Physical activity . Advanced directives . List of other physicians . Hospitalizations, surgeries, and ER visits in previous 12 months . Vitals . Screenings to include cognitive, depression, and falls . Referrals and appointments  In addition, I have reviewed and discussed with patient certain preventive protocols, quality metrics, and best practice recommendations. A written personalized care plan for preventive services as well as general preventive health recommendations were provided to patient.     Andrez Grime, LPN  [id number]

## 2020-01-19 NOTE — Telephone Encounter (Signed)
-----   Message from Cloyd Stagers, RT sent at 01/08/2020  2:03 PM EST ----- Regarding: Lab Orders for Monday 3.8.2021 Please place lab orders for Monday 3.8.2021, office visit for physical on Tuesday 3.16.2021 Thank you, Dyke Maes RT(R)

## 2020-01-20 NOTE — Progress Notes (Signed)
No critical labs need to be addressed urgently. We will discuss labs in detail at upcoming office visit.   

## 2020-01-21 ENCOUNTER — Ambulatory Visit (HOSPITAL_COMMUNITY): Payer: Medicare PPO | Admitting: Anesthesiology

## 2020-01-21 ENCOUNTER — Observation Stay (HOSPITAL_COMMUNITY)
Admission: AD | Admit: 2020-01-21 | Discharge: 2020-01-22 | Disposition: A | Discharge status: Home / Self Care | Payer: Medicare PPO | Attending: Gastroenterology | Admitting: Gastroenterology

## 2020-01-21 ENCOUNTER — Encounter (HOSPITAL_COMMUNITY)
Admission: AD | Disposition: A | Discharge status: Home / Self Care | Payer: Self-pay | Source: Home / Self Care | Attending: Internal Medicine

## 2020-01-21 ENCOUNTER — Other Ambulatory Visit: Payer: Self-pay

## 2020-01-21 ENCOUNTER — Encounter (HOSPITAL_COMMUNITY): Payer: Self-pay | Admitting: Gastroenterology

## 2020-01-21 DIAGNOSIS — Z79899 Other long term (current) drug therapy: Secondary | ICD-10-CM | POA: Diagnosis not present

## 2020-01-21 DIAGNOSIS — Z820 Family history of epilepsy and other diseases of the nervous system: Secondary | ICD-10-CM | POA: Insufficient documentation

## 2020-01-21 DIAGNOSIS — K219 Gastro-esophageal reflux disease without esophagitis: Principal | ICD-10-CM | POA: Insufficient documentation

## 2020-01-21 DIAGNOSIS — Z7982 Long term (current) use of aspirin: Secondary | ICD-10-CM | POA: Insufficient documentation

## 2020-01-21 DIAGNOSIS — I480 Paroxysmal atrial fibrillation: Secondary | ICD-10-CM | POA: Diagnosis present

## 2020-01-21 DIAGNOSIS — M797 Fibromyalgia: Secondary | ICD-10-CM | POA: Diagnosis not present

## 2020-01-21 DIAGNOSIS — F329 Major depressive disorder, single episode, unspecified: Secondary | ICD-10-CM | POA: Insufficient documentation

## 2020-01-21 DIAGNOSIS — K317 Polyp of stomach and duodenum: Secondary | ICD-10-CM | POA: Insufficient documentation

## 2020-01-21 DIAGNOSIS — K228 Other specified diseases of esophagus: Secondary | ICD-10-CM | POA: Insufficient documentation

## 2020-01-21 DIAGNOSIS — Z8249 Family history of ischemic heart disease and other diseases of the circulatory system: Secondary | ICD-10-CM | POA: Insufficient documentation

## 2020-01-21 DIAGNOSIS — Z8379 Family history of other diseases of the digestive system: Secondary | ICD-10-CM | POA: Insufficient documentation

## 2020-01-21 DIAGNOSIS — Z86010 Personal history of colonic polyps: Secondary | ICD-10-CM | POA: Diagnosis not present

## 2020-01-21 DIAGNOSIS — I2510 Atherosclerotic heart disease of native coronary artery without angina pectoris: Secondary | ICD-10-CM | POA: Diagnosis present

## 2020-01-21 DIAGNOSIS — I10 Essential (primary) hypertension: Secondary | ICD-10-CM | POA: Diagnosis not present

## 2020-01-21 DIAGNOSIS — Z882 Allergy status to sulfonamides status: Secondary | ICD-10-CM | POA: Insufficient documentation

## 2020-01-21 DIAGNOSIS — R519 Headache, unspecified: Secondary | ICD-10-CM | POA: Insufficient documentation

## 2020-01-21 DIAGNOSIS — M8580 Other specified disorders of bone density and structure, unspecified site: Secondary | ICD-10-CM | POA: Diagnosis not present

## 2020-01-21 DIAGNOSIS — Z85820 Personal history of malignant melanoma of skin: Secondary | ICD-10-CM | POA: Diagnosis not present

## 2020-01-21 DIAGNOSIS — Z833 Family history of diabetes mellitus: Secondary | ICD-10-CM | POA: Insufficient documentation

## 2020-01-21 DIAGNOSIS — Z955 Presence of coronary angioplasty implant and graft: Secondary | ICD-10-CM | POA: Insufficient documentation

## 2020-01-21 DIAGNOSIS — Z9889 Other specified postprocedural states: Secondary | ICD-10-CM

## 2020-01-21 DIAGNOSIS — M1990 Unspecified osteoarthritis, unspecified site: Secondary | ICD-10-CM | POA: Insufficient documentation

## 2020-01-21 DIAGNOSIS — K449 Diaphragmatic hernia without obstruction or gangrene: Secondary | ICD-10-CM

## 2020-01-21 DIAGNOSIS — Z800 Family history of malignant neoplasm of digestive organs: Secondary | ICD-10-CM | POA: Insufficient documentation

## 2020-01-21 DIAGNOSIS — Z885 Allergy status to narcotic agent status: Secondary | ICD-10-CM | POA: Insufficient documentation

## 2020-01-21 DIAGNOSIS — Z951 Presence of aortocoronary bypass graft: Secondary | ICD-10-CM | POA: Diagnosis not present

## 2020-01-21 DIAGNOSIS — E119 Type 2 diabetes mellitus without complications: Secondary | ICD-10-CM | POA: Diagnosis not present

## 2020-01-21 DIAGNOSIS — Z794 Long term (current) use of insulin: Secondary | ICD-10-CM | POA: Insufficient documentation

## 2020-01-21 DIAGNOSIS — G4733 Obstructive sleep apnea (adult) (pediatric): Secondary | ICD-10-CM | POA: Diagnosis present

## 2020-01-21 DIAGNOSIS — Z8673 Personal history of transient ischemic attack (TIA), and cerebral infarction without residual deficits: Secondary | ICD-10-CM | POA: Insufficient documentation

## 2020-01-21 DIAGNOSIS — I152 Hypertension secondary to endocrine disorders: Secondary | ICD-10-CM | POA: Diagnosis present

## 2020-01-21 DIAGNOSIS — R1310 Dysphagia, unspecified: Secondary | ICD-10-CM

## 2020-01-21 DIAGNOSIS — Z791 Long term (current) use of non-steroidal anti-inflammatories (NSAID): Secondary | ICD-10-CM | POA: Insufficient documentation

## 2020-01-21 DIAGNOSIS — Z8349 Family history of other endocrine, nutritional and metabolic diseases: Secondary | ICD-10-CM | POA: Insufficient documentation

## 2020-01-21 DIAGNOSIS — G4730 Sleep apnea, unspecified: Secondary | ICD-10-CM | POA: Diagnosis not present

## 2020-01-21 DIAGNOSIS — Z96651 Presence of right artificial knee joint: Secondary | ICD-10-CM | POA: Insufficient documentation

## 2020-01-21 DIAGNOSIS — E785 Hyperlipidemia, unspecified: Secondary | ICD-10-CM | POA: Diagnosis not present

## 2020-01-21 DIAGNOSIS — Z803 Family history of malignant neoplasm of breast: Secondary | ICD-10-CM | POA: Insufficient documentation

## 2020-01-21 DIAGNOSIS — E1159 Type 2 diabetes mellitus with other circulatory complications: Secondary | ICD-10-CM

## 2020-01-21 HISTORY — PX: TRANSORAL INCISIONLESS FUNDOPLICATION: SHX6840

## 2020-01-21 HISTORY — PX: ESOPHAGEAL DILATION: SHX303

## 2020-01-21 HISTORY — PX: ESOPHAGOGASTRODUODENOSCOPY (EGD) WITH PROPOFOL: SHX5813

## 2020-01-21 LAB — GLUCOSE, CAPILLARY
Glucose-Capillary: 89 mg/dL (ref 70–99)
Glucose-Capillary: 117 mg/dL — ABNORMAL HIGH (ref 70–99)
Glucose-Capillary: 135 mg/dL — ABNORMAL HIGH (ref 70–99)

## 2020-01-21 SURGERY — ESOPHAGOGASTRODUODENOSCOPY (EGD) WITH PROPOFOL
Anesthesia: General

## 2020-01-21 MED ORDER — FENTANYL CITRATE (PF) 100 MCG/2ML IJ SOLN
INTRAMUSCULAR | Status: AC
  Filled 2020-01-21: qty 2

## 2020-01-21 MED ORDER — EPHEDRINE SULFATE-NACL 50-0.9 MG/10ML-% IV SOSY
PREFILLED_SYRINGE | INTRAVENOUS | Status: DC | PRN
  Administered 2020-01-21: 10 mg via INTRAVENOUS

## 2020-01-21 MED ORDER — SCOPOLAMINE 1 MG/3DAYS TD PT72
MEDICATED_PATCH | TRANSDERMAL | Status: AC
  Filled 2020-01-21: qty 1

## 2020-01-21 MED ORDER — SODIUM CHLORIDE 0.9 % IV SOLN: INTRAVENOUS | Status: DC

## 2020-01-21 MED ORDER — PANTOPRAZOLE SODIUM 40 MG PO TBEC
40.0000 mg | DELAYED_RELEASE_TABLET | Freq: Two times a day (BID) | ORAL | Status: DC
  Administered 2020-01-21 – 2020-01-22 (×2): 40 mg via ORAL
  Filled 2020-01-21 (×2): qty 1

## 2020-01-21 MED ORDER — MEPERIDINE HCL 25 MG/ML IJ SOLN: 6.2500 mg | INTRAMUSCULAR | Status: DC | PRN

## 2020-01-21 MED ORDER — INSULIN ASPART 100 UNIT/ML ~~LOC~~ SOLN
0.0000 [IU] | Freq: Three times a day (TID) | SUBCUTANEOUS | Status: DC
  Administered 2020-01-22: 3 [IU] via SUBCUTANEOUS

## 2020-01-21 MED ORDER — ONDANSETRON HCL 4 MG/2ML IJ SOLN
INTRAMUSCULAR | Status: DC | PRN
  Administered 2020-01-21: 4 mg via INTRAVENOUS

## 2020-01-21 MED ORDER — LIDOCAINE HCL (CARDIAC) PF 100 MG/5ML IV SOSY
PREFILLED_SYRINGE | INTRAVENOUS | Status: DC | PRN
  Administered 2020-01-21: 100 mg via INTRAVENOUS

## 2020-01-21 MED ORDER — FENTANYL CITRATE (PF) 100 MCG/2ML IJ SOLN
INTRAMUSCULAR | Status: DC | PRN
  Administered 2020-01-21 (×2): 50 ug via INTRAVENOUS

## 2020-01-21 MED ORDER — DEXAMETHASONE SODIUM PHOSPHATE 10 MG/ML IJ SOLN
INTRAMUSCULAR | Status: DC | PRN
  Administered 2020-01-21: 10 mg via INTRAVENOUS

## 2020-01-21 MED ORDER — ACETAMINOPHEN 325 MG PO TABS
325.0000 mg | ORAL_TABLET | ORAL | Status: DC | PRN
  Administered 2020-01-22: 01:00:00 650 mg via ORAL
  Filled 2020-01-21: qty 2

## 2020-01-21 MED ORDER — CEFAZOLIN SODIUM-DEXTROSE 2-4 GM/100ML-% IV SOLN
2.0000 g | Freq: Once | INTRAVENOUS | Status: AC
  Administered 2020-01-21: 2 g via INTRAVENOUS
  Filled 2020-01-21: qty 100

## 2020-01-21 MED ORDER — FENTANYL CITRATE (PF) 100 MCG/2ML IJ SOLN
25.0000 ug | INTRAMUSCULAR | Status: AC | PRN
  Administered 2020-01-21 (×3): 25 ug via INTRAVENOUS
  Administered 2020-01-21 – 2020-01-22 (×2): 50 ug via INTRAVENOUS
  Filled 2020-01-21 (×3): qty 2

## 2020-01-21 MED ORDER — SCOPOLAMINE 1 MG/3DAYS TD PT72
1.0000 | MEDICATED_PATCH | Freq: Once | TRANSDERMAL | Status: DC
  Administered 2020-01-21: 1.5 mg via TRANSDERMAL

## 2020-01-21 MED ORDER — DEXAMETHASONE SODIUM PHOSPHATE 10 MG/ML IJ SOLN
8.0000 mg | Freq: Four times a day (QID) | INTRAMUSCULAR | Status: DC
  Administered 2020-01-21 – 2020-01-22 (×3): 8 mg via INTRAVENOUS
  Filled 2020-01-21 (×3): qty 1

## 2020-01-21 MED ORDER — ROCURONIUM BROMIDE 100 MG/10ML IV SOLN
INTRAVENOUS | Status: DC | PRN
  Administered 2020-01-21: 50 mg via INTRAVENOUS

## 2020-01-21 MED ORDER — METOPROLOL TARTRATE 5 MG/5ML IV SOLN
5.0000 mg | Freq: Four times a day (QID) | INTRAVENOUS | Status: DC
  Administered 2020-01-21: 5 mg via INTRAVENOUS
  Filled 2020-01-21: qty 5

## 2020-01-21 MED ORDER — SUCCINYLCHOLINE CHLORIDE 200 MG/10ML IV SOSY
PREFILLED_SYRINGE | INTRAVENOUS | Status: DC | PRN
  Administered 2020-01-21: 100 mg via INTRAVENOUS

## 2020-01-21 MED ORDER — METOCLOPRAMIDE HCL 5 MG/ML IJ SOLN
10.0000 mg | Freq: Four times a day (QID) | INTRAMUSCULAR | Status: DC
  Administered 2020-01-21 – 2020-01-22 (×3): 10 mg via INTRAVENOUS
  Filled 2020-01-21 (×3): qty 2

## 2020-01-21 MED ORDER — LIDOCAINE VISCOUS HCL 2 % MT SOLN: 5.0000 mL | Freq: Three times a day (TID) | OROMUCOSAL | Status: DC | PRN

## 2020-01-21 MED ORDER — PROPOFOL 10 MG/ML IV BOLUS
INTRAVENOUS | Status: DC | PRN
  Administered 2020-01-21: 150 mg via INTRAVENOUS

## 2020-01-21 MED ORDER — SIMETHICONE 80 MG PO CHEW: 80.0000 mg | CHEWABLE_TABLET | Freq: Four times a day (QID) | ORAL | Status: DC | PRN

## 2020-01-21 MED ORDER — ACETAMINOPHEN 160 MG/5ML PO SOLN: 325.0000 mg | ORAL | Status: DC | PRN

## 2020-01-21 MED ORDER — ONDANSETRON HCL 4 MG/2ML IJ SOLN
4.0000 mg | Freq: Four times a day (QID) | INTRAMUSCULAR | Status: DC
  Administered 2020-01-21 – 2020-01-22 (×4): 4 mg via INTRAVENOUS
  Filled 2020-01-21 (×4): qty 2

## 2020-01-21 MED ORDER — OXYCODONE HCL 5 MG PO TABS: 5.0000 mg | ORAL_TABLET | Freq: Once | ORAL | Status: DC | PRN

## 2020-01-21 MED ORDER — ONDANSETRON HCL 4 MG/2ML IJ SOLN
INTRAMUSCULAR | Status: AC
  Filled 2020-01-21: qty 2

## 2020-01-21 MED ORDER — ONDANSETRON HCL 4 MG/2ML IJ SOLN
4.0000 mg | Freq: Once | INTRAMUSCULAR | Status: AC
  Administered 2020-01-21: 4 mg via INTRAVENOUS

## 2020-01-21 MED ORDER — SUGAMMADEX SODIUM 200 MG/2ML IV SOLN
INTRAVENOUS | Status: DC | PRN
  Administered 2020-01-21: 200 mg via INTRAVENOUS

## 2020-01-21 MED ORDER — OXYCODONE HCL 5 MG/5ML PO SOLN: 5.0000 mg | Freq: Once | ORAL | Status: DC | PRN

## 2020-01-21 MED ORDER — ONDANSETRON HCL 4 MG/2ML IJ SOLN: 4.0000 mg | Freq: Once | INTRAMUSCULAR | Status: DC | PRN

## 2020-01-21 MED ORDER — SUCRALFATE 1 GM/10ML PO SUSP
1.0000 g | Freq: Three times a day (TID) | ORAL | Status: DC
  Administered 2020-01-21 – 2020-01-22 (×3): 1 g via ORAL
  Filled 2020-01-21 (×3): qty 10

## 2020-01-21 MED ORDER — LACTATED RINGERS IV SOLN
INTRAVENOUS | Status: DC
  Administered 2020-01-21: 1000 mL via INTRAVENOUS

## 2020-01-21 MED ORDER — FAMOTIDINE IN NACL 20-0.9 MG/50ML-% IV SOLN
20.0000 mg | Freq: Once | INTRAVENOUS | Status: AC
  Administered 2020-01-21: 20 mg via INTRAVENOUS
  Filled 2020-01-21: qty 50

## 2020-01-21 MED ORDER — FENTANYL CITRATE (PF) 100 MCG/2ML IJ SOLN
INTRAMUSCULAR | Status: AC
  Administered 2020-01-21: 25 ug via INTRAVENOUS
  Filled 2020-01-21: qty 2

## 2020-01-21 SURGICAL SUPPLY — 16 items
BLOCK BITE 60FR ADLT L/F BLUE (MISCELLANEOUS) ×3 IMPLANT
ELECT REM PT RETURN 9FT ADLT (ELECTROSURGICAL)
ELECTRODE REM PT RTRN 9FT ADLT (ELECTROSURGICAL) IMPLANT
FORCEP RJ3 GP 1.8X160 W-NEEDLE (CUTTING FORCEPS) IMPLANT
FORCEPS BIOP RAD 4 LRG CAP 4 (CUTTING FORCEPS) IMPLANT
NDL SCLEROTHERAPY 25GX240 (NEEDLE) IMPLANT
NEEDLE SCLEROTHERAPY 25GX240 (NEEDLE) IMPLANT
PROBE APC STR FIRE (PROBE) IMPLANT
PROBE INJECTION GOLD (MISCELLANEOUS)
PROBE INJECTION GOLD 7FR (MISCELLANEOUS) IMPLANT
SNARE SHORT THROW 13M SML OVAL (MISCELLANEOUS) IMPLANT
SYR 50ML LL SCALE MARK (SYRINGE) IMPLANT
SerosaFuse Implantable Fastener Kit ×1 IMPLANT
TUBING ENDO SMARTCAP PENTAX (MISCELLANEOUS) ×6 IMPLANT
TUBING IRRIGATION ENDOGATOR (MISCELLANEOUS) ×3 IMPLANT
WATER STERILE IRR 1000ML POUR (IV SOLUTION) IMPLANT

## 2020-01-21 NOTE — H&P (Signed)
P  Chief Complaint:    GERD, Transoral Incisionless Fundoplication (TIF)  GI History: 79 year old female with longstanding history of reflux and small hiatal hernia, referred to me by Dr. Havery Moros to discuss ongoing treatment options, to include consideration for Transoral Incisionless Fundoplication (TIF) with a goal to stop or significantly reduce acid suppression therapy.  Has trialed multiple acid suppression medications in the past.  She is currently treated with Dexilant 60 mg/day, which improves her symptoms, but with increasing breakthrough of pyrosis.  No current dysphagia.  Of note, recent DEXA with osteopenia and did have a recent fracture of left elbow.  GERD history: -Index symptoms: Regurgitation, pyrosis. +nocturnal sxs -Current medications: Dexilant 60 mg/day, Carafate -Complications: 2 cm hiatal hernia  GERD evaluation: -Last EGD: 06/2019 -Barium esophagram: 04/2018: Tiny HH, otherwise normal.  Normal motility -Esophageal Manometry: ?  Jackhammer esophagus with hypertensive LES, normal IRP, no achalasia in 2017.  Repeat a.m. this month was normal -pH/Impedance: 2017.  DeMeester score 40 with majority of reflux episodes being acidic.  SI 75%   Endoscopic history: - EGD (12/2015): Small HH, numerous fundic gland polyps - EGD (06/2019, Dr. Havery Moros): 2 cm HH, Hill Grade 2 valve, fundic gland polyps  Hx of 2V CABG approx 2006 at [facility] and PCI with drug eluding stent placement in 2012. Follows with Dr. Marlou Porch, Cardiology. Currently taking ASA 81 mg. 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   HPI:     Patient is a 79 y.o. female presenting today for TIF as a means to better control reflux and stop or significantly reduce need for acid suppression therapy.  Cardiac clearance previously obtained by Dr. Marlou Porch.  No change in medical history since last appointment.  Procedure has been delayed a couple of times due to Covid related  restrictions, but patient eager to proceed today.   Review of systems:     No chest pain, no SOB, no fevers, no urinary sx   Past Medical History:  Diagnosis Date  . Angina   . Arthritis   . Atrial fibrillation (HCC)    ASPIRIN FOR BLOOD THINNER  . Atypical mole 09/22/2016  . Bulging discs    lumbar   . Cancer ([place])    melanoma  . Complication of anesthesia    pt states has choking sensation with ET tube   . Coronary artery disease   . Depression   . Diabetes mellitus    diet controlled/on meds  . Family history of breast cancer   . Family history of colon cancer   . Fibromyalgia   . GERD (gastroesophageal reflux disease)   . H/O hiatal hernia   . Headache(784.0)    "recurring"  . High cholesterol   . History of colon polyps   . Hyperlipidemia   . Hypertension   . 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 Migraines    "til ~ 1980"  . PONV (postoperative nausea and vomiting)   . Restless leg syndrome   . Sciatic nerve pain    "from pinched nerve"  . Sleep apnea    uses CPAP  . Stroke [facility]) 2014   no deficits  . Weakness of right side of body    "I've had PT for it; they don't know what it's from".  CORTISONE INJECTION INTO BACK 08/30/12    Patient's surgical history, family medical history, social history, medications and allergies were all reviewed in Epic    No current facility-administered medications for this encounter.  Physical Exam:     There were no vitals taken for this visit.  GENERAL:  Pleasant female in NAD PSYCH: : Cooperative, normal affect EENT:  conjunctiva pink, mucous membranes moist, neck supple without masses CARDIAC:  RRR, no murmur heard, no peripheral edema PULM: Normal respiratory effort, lungs CTA bilaterally, no wheezing ABDOMEN:  Nondistended, soft, nontender. No obvious masses, no hepatomegaly,  normal bowel sounds SKIN:  turgor, no lesions seen Musculoskeletal:  Normal muscle tone, normal strength NEURO: Alert and oriented x 3, no focal  neurologic deficits   IMPRESSION and PLAN:    1) GERD 2) Hiatal hernia  Nataki Sturdy is a 79 y.o. female with a long-standing history of GERD, incompletely responsive to PPI therapy and requesting antireflux surgery with goal of improved/resolved symptoms and stopping acid suppression medications.  -TIF today at [facility] long hospital with plan for admission to the Hospitalist Service overnight for post-operative observation  -Additional recommendations for postoperative care pending endoscopic findings/TIF today -Sincerely appreciate assistance by [facility] in the overnight admission of this medically complex patient  3) CAD 4) History of two-vessel CABG 5) History of PCI post CABG 6) History of TIA 7) Hypertension  -Aspirin has been held for 5 days.  Plan to hold for an additional 3-5 days after TIF -Cardiac clearance previously obtained.  Moderate overall cardiac risk for general anesthesia.  We previously discussed the risk/benefit profile at length, and she wishes to proceed          Lavena Bullion ,DO, FACG 01/21/2020, 12:24 PM

## 2020-01-21 NOTE — Anesthesia Procedure Notes (Signed)
Procedure Name: Intubation Date/Time: 01/21/2020 1:58 PM Performed by: [place] Oleta Gunnoe C, CRNA Pre-anesthesia Checklist: Patient identified, Emergency Drugs available, Suction available and Patient being monitored Patient Re-evaluated:Patient Re-evaluated prior to induction Oxygen Delivery Method: Circle system utilized Preoxygenation: Pre-oxygenation with 100% oxygen Induction Type: IV induction and Rapid sequence Laryngoscope Size: Mac and 3 Grade View: Grade I Tube type: Oral Tube size: 7.0 mm Number of attempts: 1 Airway Equipment and Method: Stylet and Oral airway Placement Confirmation: ETT inserted through vocal cords under direct vision,  positive ETCO2 and breath sounds checked- equal and bilateral Secured at: 21 cm Tube secured with: Tape Dental Injury: Teeth and Oropharynx as per pre-operative assessment

## 2020-01-21 NOTE — Op Note (Signed)
[facility] Patient Name: Morgan [name] Procedure Date: 01/21/2020 MRN: [id number] Attending MD: Gerrit Heck , MD Date of Birth: [birth year] CSN: [id number] Age: 79 Admit Type: Inpatient Procedure:                Upper GI endoscopy Indications:              Heartburn, For therapy of esophageal reflux                           79 yo with a long-standing history of GERD,                            incompletely responsive to PPI therapy presents                            today for Transoral Incisionless Fundoplication                            (TIF). Providers:                Gerrit Heck, MD, Glori Bickers, RN, Lina Sar, Technician, Elspeth Cho Tech.,                            Technician, Stephanie [place] CRNA Referring MD:              Medicines:                General Anesthesia Complications:            No immediate complications. Estimated Blood Loss:     Estimated blood loss was minimal. Procedure:                Pre-Anesthesia Assessment:                           - Prior to the procedure, a History and Physical                            was performed, and patient medications and                            allergies were reviewed. The patient's tolerance of                            previous anesthesia was also reviewed. The risks                            and benefits of the procedure and the sedation                            options and risks were discussed with the patient.                            All questions were answered, and informed consent  was obtained. Prior Anticoagulants: The patient has                            taken no previous anticoagulant or antiplatelet                            agents except for aspirin (held x7 days). ASA Grade                            Assessment: III - A patient with severe systemic                            disease. After reviewing the  risks and benefits,                            the patient was deemed in satisfactory condition to                            undergo the procedure.                           After obtaining informed consent, the endoscope was                            passed under direct vision. Throughout the                            procedure, the patient's blood pressure, pulse, and                            oxygen saturations were monitored continuously. The                            [id number]) Olympus gastroscope was                            introduced through the mouth, and advanced to the                            second part of duodenum. The upper GI endoscopy was                            accomplished without difficulty. The patient                            tolerated the procedure well. Scope In: Scope Out: Findings:      The examined esophagus was normal. The scope was withdrawn. Dilation was       performed with a Maloney dilator with mild resistance at 52 Fr. The       dilation site was examined following endoscope reinsertion and showed       mild mucosal disruption. Estimated blood loss was minimal.      The Z-line was regular and was found 39 cm from the incisors.  The gastroesophageal flap valve was visualized endoscopically and       classified as Hill Grade II (fold present, opens with respiration). The       decision was made to perform transoral fundoplication with the EsophyX Z       system. Before the procedure, the gastroesophageal flap valve was       classified as Hill Grade II (fold present, opens with respiration). The       endoscope was withdrawn, placed through the plication device, reinserted       into the patient and advanced past the level of the GE junction at 39 cm       from the incisors and into the stomach. Next, the endoscope was advanced       beyond the device and retroflexed. The first plication site was       identified at the 11 o'clock  position. With the device in the proper       position, the helical retractor was deployed and tissue was pulled into       the mold before it was closed. The device was rotated, suction was       applied using the invaginator, then the device was advanced slightly and       two H-shaped fasteners were placed. The device was reloaded and the       process repeated in order to deploy a total of six fasteners at the       first site. The device was then rotated to the 1 o'clock position after       which the helical retractor was used to grasp additional tissue within       the mold before rotation and deployment of a total of six fasteners at       the second site. To complete reconstruction of the valve, additional       fasteners were deployed at the following sites: four fasteners at 5       o'clock, four fasteners at 7 o'clock and an additional four fasteners at       11 o'clock positions. In total, 24 fasteners contributed to create a       valve measuring 3 cm in length which involved 270 degrees of the       circumference upon retroflexed view. The EsophyX device and endoscope       were then removed. Relook endoscopy was performed prior to the       conclusion of the case to confirm the above findings. Estimated blood       loss was minimal.      Multiple small sessile polyps with no stigmata of recent bleeding were       found in the gastric fundus and in the gastric body. These were       consistent with benign fundic gland polyps, and not resected.      The incisura, gastric antrum and pylorus were normal.      The duodenal bulb, first portion of the duodenum and second portion of       the duodenum were normal. Impression:               - Normal esophagus. Dilated.                           - Z-line regular, 39 cm from the incisors.                           -  Gastroesophageal flap valve classified as Hill                            Grade II (fold present, opens with  respiration).                           - Multiple gastric polyps.                           - Normal incisura, antrum and pylorus.                           - Normal duodenal bulb, first portion of the                            duodenum and second portion of the duodenum.                           - EsophyX transoral fundoplication was performed.                           - No specimens collected. Moderate Sedation:      Not Applicable - Patient had care per Anesthesia. Recommendation:           -Admit to Hospitalist service onto med-surgl ward                            for overnight observation with anticipated                            discharge tomorrow                           -Zofran 4 mg IV every 6 hours x24 hours, then prn                           -Reglan 10 mg every 6 hours x24 hours, then prn                           -Resume scopolamine patch x3 days (applied preop)                           -Protonix 40 mg p.o. BID x2 weeks, then 40 mg daily                            x2 weeks, then 20 mg daily x1 week then prn                           -Decadron 8 mg every 6 hours times max 5 doses                           -Gas-X (simethicone) 4225 mg p.o. prn every 6 hours  gas pain, abdominal discomfort                           -Tylenol 3 (APAP 120 mg/codeine 12 mg per 5 mL): 15                            mL's every 4 hours prn pain                           -Colace 100 mg p.o. twice daily if taking pain                            medications                           -Clear liquid diet okay overnight                           -Okay to ambulate with assist around the ward                           -Please do not hesitate to contact me directly with                            any postoperative questions or concerns                           - Sincerely appreciate the assistance by the                            Hospitalist service in the admission of this                             patient and management of additional co-morbidities                            while inpatient.                           - Hold ASA 81 mg for an additional 5 days. Procedure Code(s):        --- Professional ---                           [phone], Esophagogastroduodenoscopy, flexible,                            transoral; with esophagogastric fundoplasty,                            partial or complete, includes duodenoscopy when                            performed                           [id number], Dilation of esophagus, by unguided  sound or                            bougie, single or multiple passes Diagnosis Code(s):        --- Professional ---                           K31.7, Polyp of stomach and duodenum                           R12, Heartburn                           K21.9, Gastro-esophageal reflux disease without                            esophagitis CPT copyright 2019 American Medical Association. All rights reserved. The codes documented in this report are preliminary and upon coder review may  be revised to meet current compliance requirements. Gerrit Heck, MD 01/21/2020 3:07:08 PM Number of Addenda: 0

## 2020-01-21 NOTE — Anesthesia Preprocedure Evaluation (Signed)
Anesthesia Evaluation  Patient identified by MRN, date of birth, ID band Patient awake    Reviewed: Allergy & Precautions, H&P , NPO status , Patient's Chart, lab work & pertinent test results, reviewed documented beta blocker date and time   History of Anesthesia Complications (+) PONV  Airway Mallampati: III  TM Distance: >3 FB Neck ROM: Full    Dental no notable dental hx. (+) Teeth Intact, Dental Advisory Given   Pulmonary sleep apnea and Continuous Positive Airway Pressure Ventilation ,    Pulmonary exam normal breath sounds clear to auscultation       Cardiovascular hypertension, Pt. on medications and Pt. on home beta blockers + CAD   Rhythm:Regular Rate:Normal     Neuro/Psych  Headaches, Depression TIA   GI/Hepatic Neg liver ROS, GERD  Medicated and Controlled,  Endo/Other  diabetes, Type 2, Oral Hypoglycemic Agents  Renal/GU negative Renal ROS  negative genitourinary   Musculoskeletal  (+) Arthritis , Osteoarthritis,  Fibromyalgia -  Abdominal   Peds negative pediatric ROS (+)  Hematology negative hematology ROS (+)   Anesthesia Other Findings   Reproductive/Obstetrics negative OB ROS                             Anesthesia Physical  Anesthesia Plan  ASA: III  Anesthesia Plan: General   Post-op Pain Management:    Induction: Intravenous  PONV Risk Score and Plan: 4 or greater and Ondansetron  Airway Management Planned: LMA and Oral ETT  Additional Equipment:   Intra-op Plan:   Post-operative Plan: Extubation in OR  Informed Consent: I have reviewed the patients History and Physical, chart, labs and discussed the procedure including the risks, benefits and alternatives for the proposed anesthesia with the patient or authorized representative who has indicated his/her understanding and acceptance.     Dental advisory given  Plan Discussed with: CRNA,  Anesthesiologist and Surgeon  Anesthesia Plan Comments:         Anesthesia Quick Evaluation

## 2020-01-21 NOTE — H&P (Signed)
History and Physical    Morgan [name] [id number] DOB: Sep 07, 1941 DOA: 01/21/2020  PCP: Jinny Sanders, MD  Patient coming from: PACU.  I have personally briefly reviewed patient's old medical records in [place]  Chief Complaint: Status post fundoplication  HPI: Morgan [name] is a 79 y.o. 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 Bryan Lemma.  She complains of epigastric discomfort radiating to her chest, but denies nausea, emesis, diarrhea or constipation.  Denies precordial chest pain, dyspnea, palpitations, dizziness, diaphoresis, but gets occasional lower extremity edema.  No dysuria, frequency or hematuria.  No polyuria, polydipsia, polyphagia or blurred vision.  Review of Systems: As per HPI otherwise 10 point review of systems negative.   Past Medical History:  Diagnosis Date  . Angina   . Arthritis   . Atrial fibrillation (HCC)    ASPIRIN FOR BLOOD THINNER  . Atypical mole 09/22/2016  . Bulging discs    lumbar   . Cancer ([place])    melanoma  . Complication of anesthesia    pt states has choking sensation with ET tube   . Coronary artery disease   . Depression   . Diabetes mellitus    diet controlled/on meds  . Family history of breast cancer   . Family history of colon cancer   . Fibromyalgia   . GERD (gastroesophageal reflux disease)   . H/O hiatal hernia   . Headache(784.0)    "recurring"  . High cholesterol   . History of colon polyps   . Hyperlipidemia   . Hypertension   . 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 Migraines    "til ~ 1980"  . PONV (postoperative nausea and vomiting)   . Restless leg syndrome   . Sciatic nerve pain    "from pinched nerve"  . Sleep apnea    uses  CPAP  . Stroke [facility]) 2014   no deficits  . Weakness of right side of body    "I've had PT for it; they don't know what it's from".  CORTISONE INJECTION INTO BACK 08/30/12    Past Surgical History:  Procedure Laterality Date  . [place] STUDY N/A 12/29/2015   Procedure: [place] STUDY;  Surgeon: Manus Gunning, MD;  Location: [facility];  Service: Gastroenterology;  Laterality: N/A;  . CARPAL TUNNEL RELEASE Left 07/02/2015   Procedure: CARPAL TUNNEL RELEASE;  Surgeon: Frederik Pear, MD;  Location: [facility];  Service: Orthopedics;  Laterality: Left;  . CATARACT EXTRACTION, BILATERAL Bilateral    Oct and Nov 2017  . CORONARY ANGIOPLASTY WITH STENT PLACEMENT  06/2011   "1"  . CORONARY ARTERY BYPASS GRAFT  2005   CABG X 2  . DILATION AND CURETTAGE OF UTERUS     "more than once"  . ELBOW ARTHROSCOPY Left 07/02/2015   Procedure: ARTHROSCOPY LEFT ELBOW WITH DEBRIDEMENT AND REMOVAL LOOSE BODY;  Surgeon: Frederik Pear, MD;  Location: [facility];  Service: Orthopedics;  Laterality: Left;  . ESOPHAGEAL MANOMETRY N/A 12/29/2015   Procedure: ESOPHAGEAL MANOMETRY (EM);  Surgeon: Manus Gunning, MD;  Location: [facility];  Service: Gastroenterology;  Laterality: N/A;  . ESOPHAGEAL MANOMETRY N/A 07/30/2019   Procedure: ESOPHAGEAL MANOMETRY (EM);  Surgeon: Yetta Flock, MD;  Location: [facility];  Service: Gastroenterology;  Laterality: N/A;  .  FRACTURE SURGERY  ~ 2005   nose  . KNEE ARTHROSCOPY  09/04/2012   Procedure: ARTHROSCOPY KNEE;  Surgeon: Gearlean Alf, MD;  Location: [facility];  Service: Orthopedics;  Laterality: Right;  right knee arthroscopy with medial and lateral meniscus debridement  . 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 MOUTH SURGERY  2004   "bone replacement; had cadavear bones put in; face was collapsing"  . NASAL SEPTUM SURGERY  ~ 1986  . TOTAL KNEE ARTHROPLASTY Right 07/07/2013   Procedure: RIGHT TOTAL  KNEE ARTHROPLASTY;  Surgeon: Gearlean Alf, MD;  Location: [facility];  Service: Orthopedics;  Laterality: Right;     reports that she has never smoked. She has never used smokeless tobacco. She reports current alcohol use of about 1.0 standard drinks of alcohol per week. She reports that she does not use drugs.  Allergies  Allergen Reactions  . Hydrocodone-Acetaminophen Other (See Comments)    "Changed personality" "made me very mean"  . Percocet [Oxycodone-Acetaminophen] Other (See Comments)    hallucination  . Sulfa Antibiotics Other (See Comments)    hallucinations    Family History  Problem Relation Age of Onset  . Cancer Mother 39       breast cancer  . Breast cancer Mother 45  . Heart disease Father 65       sudden onset due to CAD  . Cancer Sister 48       colon  . Hypertension Sister   . Dementia Sister   . Diabetes Sister   . Colon cancer Sister   . GER disease Daughter   . Hypertension Daughter   . Heart disease Brother   . Hypertension Brother   . Heart attack Neg Hx   . Stroke Neg Hx    Prior to Admission medications   Medication Sig Start Date End Date Taking? Authorizing Provider  aspirin EC 81 MG tablet Take 1 tablet (81 mg total) by mouth daily. Patient taking differently: Take 81 mg by mouth at bedtime.  10/01/18  Yes Jerline Pain, MD  atorvastatin (LIPITOR) 80 MG tablet TAKE 1 TABLET BY MOUTH EVERY DAY Patient taking differently: Take 80 mg by mouth daily.  10/30/19  Yes Jerline Pain, MD  BLINK TEARS 0.25 % SOLN Place 1 drop into both eyes 3 (three) times daily as needed (dry/irritated eyes.).   Yes [provider]  calcium-vitamin D (OSCAL WITH D) 500-200 MG-UNIT tablet Take 1 tablet by mouth 2 (two) times daily.   Yes [provider]  CARAFATE 1 GM/10ML suspension TAKE 10 MLS (1 G TOTAL) BY MOUTH EVERY 6 (SIX) HOURS AS NEEDED. Patient taking differently: Take 1 g by mouth every 6 (six) hours as needed (GERD).  11/23/17  Yes  Armbruster, Carlota Raspberry, MD  cetirizine (ZYRTEC) 10 MG tablet Take 10 mg by mouth daily.    Yes [provider]  chlorthalidone (HYGROTON) 25 MG tablet TAKE 1 TABLET BY MOUTH EVERY DAY Patient taking differently: Take 25 mg by mouth daily.  12/17/19  Yes Bedsole, Amy E, MD  CINNAMON PO Take 1,000 mg by mouth in the morning and at bedtime.   Yes [provider]  clonazePAM (KLONOPIN) 1 MG tablet TAKE 2 TABLETS BY MOUTH AT BEDTIME Patient taking differently: Take 2 mg by mouth at bedtime.  12/18/19  Yes Bedsole, Amy E, MD  DEXILANT 60 MG capsule TAKE 1 CAPSULE BY MOUTH EVERY DAY Patient taking differently: Take 60 mg  by mouth daily before breakfast.  07/17/19  Yes Armbruster, Carlota Raspberry, MD  gabapentin (NEURONTIN) 300 MG capsule TAKE 2 CAPSULES (600 MG TOTAL) BY MOUTH AT BEDTIME. 12/24/19  Yes Bedsole, Amy E, MD  Insulin Pen Needle (NOVOFINE) 32G X 6 MM MISC USE TO INJECT VICTOZA DAILY. DX: E11.9 04/15/19  Yes Bedsole, Amy E, MD  meloxicam (MOBIC) 15 MG tablet Take 15 mg by mouth daily. Reported on 12/23/2015 08/31/15  Yes [provider]  metFORMIN (GLUCOPHAGE-XR) 500 MG 24 hr tablet TAKE 2 TABLETS BY MOUTH TWICE A DAY Patient taking differently: Take 1,000 mg by mouth in the morning and at bedtime.  12/30/19  Yes Bedsole, Amy E, MD  metoprolol succinate (TOPROL-XL) 100 MG 24 hr tablet TAKE 1 TABLET (100 MG TOTAL) BY MOUTH DAILY. TAKE WITH OR IMMEDIATELY FOLLOWING A MEAL. Patient taking differently: Take 100 mg by mouth every evening. Take with or immediately following a meal. 08/14/19  Yes Bedsole, Amy E, MD  Multiple Vitamins-Minerals (PRESERVISION AREDS PO) Take 1 tablet by mouth 2 (two) times daily. Reported on 12/23/2015   Yes [provider]  MYRBETRIQ 25 MG TB24 tablet TAKE 1 TABLET BY MOUTH EVERY DAY Patient taking differently: Take 25 mg by mouth daily.  12/23/19  Yes Bedsole, Amy E, MD  venlafaxine XR (EFFEXOR XR) 75 MG 24 hr capsule Take 1 capsule (75 mg total) by mouth  daily with breakfast. 01/15/20  Yes Bedsole, Amy E, MD  VICTOZA 18 MG/3ML SOPN INJECT 1.8 MG INTO THE SKIN DAILY. Patient taking differently: Inject 1.2 mg into the skin daily.  12/19/18  Yes Jinny Sanders, MD  Lancet Device MISC Use to check blood sugar twice daily. 10/16/18   Jinny Sanders, MD  Lancets (FREESTYLE) lancets Use to check blood sugar twice daily 10/16/18   Bedsole, Amy E, MD  nitroGLYCERIN (NITROSTAT) 0.4 MG SL tablet PLACE 1 TABLET (0.4 MG TOTAL) UNDER THE TONGUE EVERY 5 (FIVE) MINUTES AS NEEDED FOR CHEST PAIN Patient taking differently: Place 0.4 mg under the tongue every 5 (five) minutes x 3 doses as needed for chest pain.  09/22/18   Jinny Sanders, MD  ONETOUCH ULTRA test strip USE TO CHECK BLOOD SUGAR TWICE DAILY 08/13/19   Jinny Sanders, MD    Physical Exam: Vitals:   01/21/20 1545 01/21/20 1600 01/21/20 1615 01/21/20 1626  BP: (!) 168/70 (!) 167/72 (!) 153/91 (!) 188/75  Pulse: 64 67 66 68  Resp: 13 13 12 16   Temp:  97.9 F (36.6 C)  98 F (36.7 C)  TempSrc:    Oral  SpO2: 95% 96% 99% 100%  Weight:      Height:        Constitutional: NAD, calm, comfortable Eyes: PERRL, lids and conjunctivae normal ENMT: Mucous membranes are mildly dry. Posterior pharynx clear of any exudate or lesions. Neck: normal, supple, no masses, no thyromegaly Respiratory: clear to auscultation bilaterally, no wheezing, no crackles. Normal respiratory effort. No accessory muscle use.  Cardiovascular: Regular rate and rhythm, no murmurs / rubs / gallops. No extremity edema. 2+ pedal pulses. No carotid bruits.  Abdomen: Nondistended.  BS positive.  Soft, positive epigastric tenderness, no no guarding or rebound, no masses palpated. No hepatosplenomegaly.  Musculoskeletal: no clubbing / cyanosis. Good ROM, no contractures. Normal muscle tone.  Skin: no rashes, lesions, ulcers on limited dermatological examination. Neurologic: CN 2-12 grossly intact. Sensation intact, DTR normal. Strength 5/5 in  all 4.  Psychiatric: Normal judgment and insight.  Alert and oriented x 3. Normal mood.   Labs on Admission: I have personally reviewed following labs and imaging studies  CBC: No results for input(s): WBC, NEUTROABS, HGB, HCT, MCV, PLT in the last 168 hours. Basic Metabolic Panel: Recent Labs  Lab 01/19/20 0851  NA 142  K 4.3  CL 105  CO2 31  GLUCOSE 103*  BUN 23  CREATININE 0.88  CALCIUM 9.4   GFR: Estimated Creatinine Clearance: 49 mL/min (by C-G formula based on SCr of 0.88 mg/dL). Liver Function Tests: Recent Labs  Lab 01/19/20 0851  AST 22  ALT 21  ALKPHOS 64  BILITOT 0.4  PROT 6.3  ALBUMIN 3.9   No results for input(s): LIPASE, AMYLASE in the last 168 hours. No results for input(s): AMMONIA in the last 168 hours. Coagulation Profile: No results for input(s): INR, PROTIME in the last 168 hours. Cardiac Enzymes: No results for input(s): CKTOTAL, CKMB, CKMBINDEX, TROPONINI in the last 168 hours. BNP (last 3 results) No results for input(s): PROBNP in the last 8760 hours. HbA1C: Recent Labs    01/19/20 0851  HGBA1C 6.7*   CBG: Recent Labs  Lab 01/21/20 1251 01/21/20 1637  GLUCAP 89 117*   Lipid Profile: Recent Labs    01/19/20 0851  CHOL 125  HDL 50.30  LDLCALC 61  TRIG 71.0  CHOLHDL 2   Thyroid Function Tests: No results for input(s): TSH, T4TOTAL, FREET4, T3FREE, THYROIDAB in the last 72 hours. Anemia Panel: No results for input(s): VITAMINB12, FOLATE, FERRITIN, TIBC, IRON, RETICCTPCT in the last 72 hours. Urine analysis:    Component Value Date/Time   COLORURINE YELLOW 07/01/2013 [place] 07/01/2013 1247   LABSPEC 1.022 07/01/2013 1247   PHURINE 6.0 07/01/2013 1247   GLUCOSEU NEGATIVE 07/01/2013 1247   HGBUR NEGATIVE 07/01/2013 1247   BILIRUBINUR neg 11/30/2015 1338   KETONESUR NEGATIVE 07/01/2013 1247   PROTEINUR neg 11/30/2015 1338   PROTEINUR NEGATIVE 07/01/2013 1247   UROBILINOGEN negative 11/30/2015 1338    UROBILINOGEN 1.0 07/01/2013 1247   NITRITE neg 11/30/2015 1338   NITRITE NEGATIVE 07/01/2013 1247   LEUKOCYTESUR Negative 11/30/2015 1338    Radiological Exams on Admission: No results found.  EKG: Independently reviewed.   Assessment/Plan Principal problem:   S/P laparoscopic fundoplication    Hiatal hernia with GERD Continue postop management per Dr. Bryan Lemma.  Active Problems:   CAD (coronary artery disease) On metoprolol IV. Resume all other meds after GI clearance.    HTN (hypertension) Metoprolol IV for hypertension. Monitor blood pressure.    Paroxysmal A-fib (HCC) Metoprolol IV for rate control.    Obstructive sleep apnea Nasal cannula at this time. Avoid pressurized air with CPAP until cleared by GI.    Type 2 diabetes mellitus ([place]) On clear liquids. Continue CBG monitoring.     DVT prophylaxis: SCDs. Code Status: Full code. Family Communication: Disposition Plan: Post procedural overnight observation Consults called: GI Dr. Gerrit Heck. Admission status: Observation/telemetry.   Reubin Milan MD [facility]  If 7PM-7AM, please contact night-coverage [email or web address]  01/21/2020, 5:20 PM   This document was prepared using Dragon voice recognition software and may contain some unintended transcription errors.

## 2020-01-21 NOTE — Plan of Care (Signed)

## 2020-01-21 NOTE — Interval H&P Note (Signed)
History and Physical Interval Note:  01/21/2020 12:31 PM  Morgan [name]  has presented today for surgery, with the diagnosis of GERD/HH.  The various methods of treatment have been discussed with the patient and family. After consideration of risks, benefits and other options for treatment, the patient has consented to  Procedure(s): ESOPHAGOGASTRODUODENOSCOPY (EGD) WITH PROPOFOL (N/A) TRANSORAL INCISIONLESS FUNDOPLICATION (N/A) as a surgical intervention.  The patient's history has been reviewed, patient examined, no change in status, stable for surgery.  I have reviewed the patient's chart and labs.  Questions were answered to the patient's satisfaction.     Dominic Pea Chet Greenley

## 2020-01-21 NOTE — Transfer of Care (Signed)
Immediate Anesthesia Transfer of Care Note  Patient: Morgan [name]  Procedure(s) Performed: ESOPHAGOGASTRODUODENOSCOPY (EGD) WITH PROPOFOL (N/A ) TRANSORAL INCISIONLESS FUNDOPLICATION (N/A ) ESOPHAGEAL DILATION  Patient Location: PACU  Anesthesia Type:General  Level of Consciousness: awake, alert  and oriented  Airway & Oxygen Therapy: Patient Spontanous Breathing and Patient connected to face mask oxygen  Post-op Assessment: Report given to RN and Post -op Vital signs reviewed and stable  Post vital signs: Reviewed and stable  Last Vitals:  Vitals Value Taken Time  BP 161/84 01/21/20 1500  Temp    Pulse 67 01/21/20 1501  Resp 13 01/21/20 1501  SpO2 100 % 01/21/20 1501  Vitals shown include unvalidated device data.  Last Pain:  Vitals:   01/21/20 1243  TempSrc: Oral  PainSc: 0-No pain         Complications: No apparent anesthesia complications

## 2020-01-21 NOTE — Anesthesia Postprocedure Evaluation (Signed)
Anesthesia Post Note  Patient: Physiological scientist  Procedure(s) Performed: ESOPHAGOGASTRODUODENOSCOPY (EGD) WITH PROPOFOL (N/A ) TRANSORAL INCISIONLESS FUNDOPLICATION (N/A ) ESOPHAGEAL DILATION     Patient location during evaluation: PACU Anesthesia Type: General Level of consciousness: awake and alert Pain management: pain level controlled Vital Signs Assessment: post-procedure vital signs reviewed and stable Respiratory status: spontaneous breathing, nonlabored ventilation, respiratory function stable and patient connected to nasal cannula oxygen Cardiovascular status: blood pressure returned to baseline and stable Postop Assessment: no apparent nausea or vomiting Anesthetic complications: no    Last Vitals:  Vitals:   01/21/20 1500 01/21/20 1515  BP: (!) 161/84   Pulse: 72 63  Resp: 10 12  Temp: 36.4 C   SpO2: 100% 100%    Last Pain:  Vitals:   01/21/20 1243  TempSrc: Oral  PainSc: 0-No pain                 Nicolai Labonte

## 2020-01-22 ENCOUNTER — Encounter: Payer: Self-pay | Admitting: *Deleted

## 2020-01-22 DIAGNOSIS — I2583 Coronary atherosclerosis due to lipid rich plaque: Secondary | ICD-10-CM

## 2020-01-22 DIAGNOSIS — K449 Diaphragmatic hernia without obstruction or gangrene: Secondary | ICD-10-CM | POA: Diagnosis not present

## 2020-01-22 DIAGNOSIS — K219 Gastro-esophageal reflux disease without esophagitis: Secondary | ICD-10-CM | POA: Diagnosis not present

## 2020-01-22 DIAGNOSIS — Z9889 Other specified postprocedural states: Secondary | ICD-10-CM | POA: Diagnosis not present

## 2020-01-22 DIAGNOSIS — I480 Paroxysmal atrial fibrillation: Secondary | ICD-10-CM | POA: Diagnosis not present

## 2020-01-22 DIAGNOSIS — K228 Other specified diseases of esophagus: Secondary | ICD-10-CM | POA: Diagnosis not present

## 2020-01-22 DIAGNOSIS — I2510 Atherosclerotic heart disease of native coronary artery without angina pectoris: Secondary | ICD-10-CM | POA: Diagnosis not present

## 2020-01-22 DIAGNOSIS — G4733 Obstructive sleep apnea (adult) (pediatric): Secondary | ICD-10-CM | POA: Diagnosis not present

## 2020-01-22 DIAGNOSIS — G4730 Sleep apnea, unspecified: Secondary | ICD-10-CM | POA: Diagnosis not present

## 2020-01-22 DIAGNOSIS — F329 Major depressive disorder, single episode, unspecified: Secondary | ICD-10-CM | POA: Diagnosis not present

## 2020-01-22 DIAGNOSIS — I10 Essential (primary) hypertension: Secondary | ICD-10-CM | POA: Diagnosis not present

## 2020-01-22 DIAGNOSIS — K317 Polyp of stomach and duodenum: Secondary | ICD-10-CM | POA: Diagnosis not present

## 2020-01-22 DIAGNOSIS — E119 Type 2 diabetes mellitus without complications: Secondary | ICD-10-CM

## 2020-01-22 DIAGNOSIS — R519 Headache, unspecified: Secondary | ICD-10-CM | POA: Diagnosis not present

## 2020-01-22 LAB — CBC
HCT: 31.9 % — ABNORMAL LOW (ref 36.0–46.0)
Hemoglobin: 9.5 g/dL — ABNORMAL LOW (ref 12.0–15.0)
MCH: 25.1 pg — ABNORMAL LOW (ref 26.0–34.0)
MCHC: 29.8 g/dL — ABNORMAL LOW (ref 30.0–36.0)
MCV: 84.4 fL (ref 80.0–100.0)
Platelets: 325 10*3/uL (ref 150–400)
RBC: 3.78 MIL/uL — ABNORMAL LOW (ref 3.87–5.11)
RDW: 15.7 % — ABNORMAL HIGH (ref 11.5–15.5)
WBC: 10.2 10*3/uL (ref 4.0–10.5)
nRBC: 0 % (ref 0.0–0.2)

## 2020-01-22 LAB — BASIC METABOLIC PANEL
Anion gap: 11 (ref 5–15)
BUN: 25 mg/dL — ABNORMAL HIGH (ref 8–23)
CO2: 28 mmol/L (ref 22–32)
Calcium: 8.6 mg/dL — ABNORMAL LOW (ref 8.9–10.3)
Chloride: 101 mmol/L (ref 98–111)
Creatinine, Ser: 0.85 mg/dL (ref 0.44–1.00)
GFR calc Af Amer: >60 mL/min (ref >60)
GFR calc non Af Amer: >60 mL/min (ref >60)
Glucose, Bld: 176 mg/dL — ABNORMAL HIGH (ref 70–99)
Potassium: 4.2 mmol/L (ref 3.5–5.1)
Sodium: 140 mmol/L (ref 135–145)

## 2020-01-22 LAB — GLUCOSE, CAPILLARY: Glucose-Capillary: 166 mg/dL — ABNORMAL HIGH (ref 70–99)

## 2020-01-22 MED ORDER — SIMETHICONE 40 MG/0.6ML PO SUSP: 80.0000 mg | Freq: Four times a day (QID) | ORAL | 0 refills | Status: DC | PRN

## 2020-01-22 MED ORDER — METOCLOPRAMIDE HCL 10 MG PO TABS: 10.0000 mg | ORAL_TABLET | Freq: Four times a day (QID) | ORAL | 0 refills | Status: DC | PRN

## 2020-01-22 MED ORDER — ONDANSETRON HCL 4 MG PO TABS: 4.0000 mg | ORAL_TABLET | Freq: Four times a day (QID) | ORAL | 0 refills | Status: DC | PRN

## 2020-01-22 MED ORDER — DEXILANT 60 MG PO CPDR: DELAYED_RELEASE_CAPSULE | ORAL | 0 refills | Status: DC

## 2020-01-22 NOTE — Progress Notes (Signed)
Called to patients room at this time with rest to go home as she could not. Sleep patient advised it would be best if she stayed to continue to her care. Seems to be a slightly confused. Patient repositioned and bed and offer pain medication for her epigastric discomfort she was also placed on cpap machine. Will continue to monitor. Safety checks completed and call light placed within reach.

## 2020-01-22 NOTE — Progress Notes (Signed)
[facility] ROUNDING NOTE   Subjective: TIF completed yesterday without complications.  Patient did well overnight without any acute events.  Tolerating liquids and p.o. medications without issue.  Eager for discharge home later today.  Objective: Vital signs in last 24 hours: Temp:  [97.6 F (36.4 C)-98.3 F (36.8 C)] 97.7 F (36.5 C) (03/11 0534) Pulse Rate:  [61-72] 67 (03/11 0534) Resp:  [10-18] 18 (03/11 0534) BP: (122-199)/(48-91) 138/53 (03/11 0534) SpO2:  [95 %-100 %] 95 % (03/11 0534) Weight:  [69.9 kg] 69.9 kg (03/10 1243) Last BM Date: 01/20/20 General: NAD HEENT: No LAD, no crepitace Abdomen: Non tender, no peritoneal signs. Otherwise, soft, ND, +BS Ext: No c/c/e    Intake/Output from previous day: 03/10 0701 - 03/11 0700 In: 974.5 [I.V.:874.5; IV Piggyback:100] Out: 600 [Urine:600] Intake/Output this shift: No intake/output data recorded.   Lab Results: Recent Labs    01/22/20 0439  WBC 10.2  HGB 9.5*  PLT 325  MCV 84.4   BMET Recent Labs    01/19/20 0851 01/22/20 0439  NA 142 140  K 4.3 4.2  CL 105 101  CO2 31 28  GLUCOSE 103* 176*  BUN 23 25*  CREATININE 0.88 0.85  CALCIUM 9.4 8.6*   LFT Recent Labs    01/19/20 0851  PROT 6.3  ALBUMIN 3.9  AST 22  ALT 21  ALKPHOS 64  BILITOT 0.4   PT/INR No results for input(s): INR in the last 72 hours.    Imaging/Other results: No results found.    Assessment and Plan:   Impression and Recommendations:  Morgan [name] is a 79 y.o. female s/p EGD with Transoral Incisionless Fundoplication (TIF) completed yesterday with no events on overnight observation. Ok for d/c to home today with the following plan:   - Resume Dexilant 60 mg/day x2 weeks, then decrease to QOD x1 week then discontinue   - D/c with Zofran 4 mg PO prn Q6 hours for nausea  - D/c with Reglan 10 mg PO prn Q6 hours for nausea  - D/c with Simethicone 80 mg PO prn Q6 hours for bloating/abdominal discomfort    Diet:  - 2 weeks of liquid soft foods followed by 4 weeks slowly progressive diet back to regular  - Previously provided with handout for post operative diet plan   Post Op Activity:  - Week 1: encourage short distance walking, minimal physical activity, no lifting >5 lbs  - Week 2: Slow climbing stairs, no intense exercise, no lifting >5 lbs  - Week 3-6: No intense exercise, may lift up to 25 lbs  - Week 7: Resume normal activity   - Will call to schedule follow-up with me on in the [facility] at [facility] - Sincerely appreciate the assistance by the Hospital service in the admission and overnight observation of this patient.   Lavena Bullion, DO  01/22/2020, 8:23 AM [facility] Pager [phone]

## 2020-01-22 NOTE — Discharge Summary (Signed)
Physician Discharge Summary  Morgan [name] [id number] DOB: 1941/04/22 DOA: 01/21/2020  PCP: Jinny Sanders, MD  Admit date: 01/21/2020 Discharge date: 01/22/2020  Admitted From: Home  Discharge disposition: Home   Recommendations for Outpatient Follow-Up:   . Follow up with your primary care provider as scheduled by you . Check CBC, BMP in the next visit. . Follow up with gastroenterology as scheduled by the clinic.   Discharge Diagnosis:   Active Problems:   CAD (coronary artery disease)   HTN (hypertension)   Paroxysmal A-fib (HCC)   Obstructive sleep apnea   Hiatal hernia   Type 2 diabetes mellitus ([place])   Hiatal hernia with GERD   S/P laparoscopic fundoplication   Discharge Condition: Improved.  Diet recommendation: Low sodium, heart healthy.  Carbohydrate-modified.  Dietary recommendations as per GI.Morgan [name]  Wound care: None.  Code status: Full.   History of Present Illness:   Morgan [name] is a 79 y.o. 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 Bryan Lemma on 01/21/2020.Morgan [name]  She complained of epigastric discomfort radiating to her chest, but denied nausea, emesis, diarrhea or constipation.  Patient was advised observation in the hospital after the procedure.  Hospital Course:   Following conditions were addressed during hospitalization as listed below,  S/P laparoscopic fundoplication.   Hiatal hernia with GERD Patient has been seen by GI today and recommended postoperative instructions and medications which will be prescribed on discharge.  We will continue lansoprazole, Zofran and Reglan on discharge.    CAD (coronary artery disease) Remained stable.  Continue beta-blockers.    HTN  (hypertension) Resume metoprolol, chlorthalidone    Paroxysmal A-fib (HCC) Metoprolol on discharge.    Obstructive sleep apnea Resume CPAP at night.    Type 2 diabetes mellitus ([place]) Diabetic diet, further dietary instruction as per GI.   Disposition.  At this time, patient is stable for disposition home.  Advised follow-up with primary care physician as outpatient.  Medical Consultants:    GI  Procedures:    Laparoscopic fundoplication on 0000000  Subjective:   Today, patient feels okay.  Denies any chest pain, shortness of breath, fever or chills.  Discharge Exam:   Vitals:   01/22/20 0022 01/22/20 0534  BP: (!) 127/48 (!) 138/53  Pulse: 65 67  Resp:  18  Temp:  97.7 F (36.5 C)  SpO2:  95%   Vitals:   01/21/20 1626 01/21/20 2120 01/22/20 0022 01/22/20 0534  BP: (!) 188/75 (!) 122/58 (!) 127/48 (!) 138/53  Pulse: 68 68 65 67  Resp: 16 18  18   Temp: 98 F (36.7 C) 97.9 F (36.6 C)  97.7 F (36.5 C)  TempSrc: Oral Oral  Oral  SpO2: 100% 99%  95%  Weight:      Height:       General: Alert awake, not in obvious distress HENT: pupils equally reacting to light,  No scleral pallor or icterus noted. Oral mucosa is moist.  Chest:  Clear breath sounds.  Diminished breath sounds bilaterally. No crackles or wheezes.  CVS: S1 &S2 heard. No murmur.  Regular rate and rhythm. Abdomen: Soft, nontender, nondistended.  Bowel sounds are heard.   Extremities: No cyanosis, clubbing or edema.  Peripheral pulses are palpable. Psych: Alert, awake and oriented, normal mood CNS:  No cranial nerve deficits.  Power  equal in all extremities.   Skin: Warm and dry.  No rashes noted.  The results of significant diagnostics from this hospitalization (including imaging, microbiology, ancillary and laboratory) are listed below for reference.     Diagnostic Studies:   No results found.   Labs:   Basic Metabolic Panel: Recent Labs  Lab 01/19/20 0851 01/22/20 0439  NA  142 140  K 4.3 4.2  CL 105 101  CO2 31 28  GLUCOSE 103* 176*  BUN 23 25*  CREATININE 0.88 0.85  CALCIUM 9.4 8.6*   GFR Estimated Creatinine Clearance: 50.8 mL/min (by C-G formula based on SCr of 0.85 mg/dL). Liver Function Tests: Recent Labs  Lab 01/19/20 0851  AST 22  ALT 21  ALKPHOS 64  BILITOT 0.4  PROT 6.3  ALBUMIN 3.9   No results for input(s): LIPASE, AMYLASE in the last 168 hours. No results for input(s): AMMONIA in the last 168 hours. Coagulation profile No results for input(s): INR, PROTIME in the last 168 hours.  CBC: Recent Labs  Lab 01/22/20 0439  WBC 10.2  HGB 9.5*  HCT 31.9*  MCV 84.4  PLT 325   Cardiac Enzymes: No results for input(s): CKTOTAL, CKMB, CKMBINDEX, TROPONINI in the last 168 hours. BNP: Invalid input(s): POCBNP CBG: Recent Labs  Lab 01/21/20 1251 01/21/20 1637 01/21/20 2122 01/22/20 0756  GLUCAP 89 117* 135* 166*   D-Dimer No results for input(s): DDIMER in the last 72 hours. Hgb A1c No results for input(s): HGBA1C in the last 72 hours. Lipid Profile No results for input(s): CHOL, HDL, LDLCALC, TRIG, CHOLHDL, LDLDIRECT in the last 72 hours. Thyroid function studies No results for input(s): TSH, T4TOTAL, T3FREE, THYROIDAB in the last 72 hours.  Invalid input(s): FREET3 Anemia work up No results for input(s): VITAMINB12, FOLATE, FERRITIN, TIBC, IRON, RETICCTPCT in the last 72 hours. Microbiology Recent Results (from the past 240 hour(s))  SARS CORONAVIRUS 2 (TAT 6-24 HRS) Nasopharyngeal Nasopharyngeal Swab     Status: None   Collection Time: 01/19/20  8:16 AM   Specimen: Nasopharyngeal Swab  Result Value Ref Range Status   SARS Coronavirus 2 NEGATIVE NEGATIVE Final    Comment: (NOTE) SARS-CoV-2 target nucleic acids are NOT DETECTED. The SARS-CoV-2 RNA is generally detectable in upper and lower respiratory specimens during the acute phase of infection. Negative results do not preclude SARS-CoV-2 infection, do not rule  out co-infections with other pathogens, and should not be used as the sole basis for treatment or other patient management decisions. Negative results must be combined with clinical observations, patient history, and epidemiological information. The expected result is Negative. Fact Sheet for Patients: SugarRoll.be Fact Sheet for Healthcare Providers: https://www.woods-mathews.com/ This test is not yet approved or cleared by the [place] FDA and  has been authorized for detection and/or diagnosis of SARS-CoV-2 by FDA under an Emergency Use Authorization (EUA). This EUA will remain  in effect (meaning this test can be used) for the duration of the COVID-19 declaration under Section 56 4(b)(1) of the Act, 21 U.S.C. section 360bbb-3(b)(1), unless the authorization is terminated or revoked sooner. Performed at [facility], [place] [address], [place], [place] [address]      Discharge Instructions:   Discharge Instructions    Call MD for:   Complete by: As directed    Worsening symptoms   Discharge instructions   Complete by: As directed    Follow up with [facility] as scheduled by the clinic/call clinic.  Regular follow-up with your  primary care physician in 1 to 2 weeks.  Medications as prescribed.  Follow postoperative/dietary instructions as provided.   Increase activity slowly   Complete by: As directed      Allergies as of 01/22/2020      Reactions   Hydrocodone-acetaminophen Other (See Comments)   "Changed personality" "made me very mean"   Percocet [oxycodone-acetaminophen] Other (See Comments)   hallucination   Sulfa Antibiotics Other (See Comments)   hallucinations      Medication List    STOP taking these medications   meloxicam 15 MG tablet Commonly known as: MOBIC     TAKE these medications   aspirin EC 81 MG tablet Take 1 tablet (81 mg total) by mouth daily. What changed: when to take this   atorvastatin  80 MG tablet Commonly known as: LIPITOR TAKE 1 TABLET BY MOUTH EVERY DAY   Blink Tears 0.25 % Soln Generic drug: Polyethylene Glycol 400 Place 1 drop into both eyes 3 (three) times daily as needed (dry/irritated eyes.).   calcium-vitamin D 500-200 MG-UNIT tablet Commonly known as: OSCAL WITH D Take 1 tablet by mouth 2 (two) times daily.   Carafate 1 GM/10ML suspension Generic drug: sucralfate TAKE 10 MLS (1 G TOTAL) BY MOUTH EVERY 6 (SIX) HOURS AS NEEDED. What changed: See the new instructions.   cetirizine 10 MG tablet Commonly known as: ZYRTEC Take 10 mg by mouth daily.   chlorthalidone 25 MG tablet Commonly known as: HYGROTON TAKE 1 TABLET BY MOUTH EVERY DAY   CINNAMON PO Take 1,000 mg by mouth in the morning and at bedtime.   clonazePAM 1 MG tablet Commonly known as: KLONOPIN TAKE 2 TABLETS BY MOUTH AT BEDTIME   Dexilant 60 MG capsule Generic drug: dexlansoprazole Take 1 capsule (60 mg total) by mouth in the morning and at bedtime for 14 days, THEN 1 capsule (60 mg total) daily for 7 days. Start taking on: January 22, 2020 What changed: See the new instructions.   freestyle lancets Use to check blood sugar twice daily   gabapentin 300 MG capsule Commonly known as: NEURONTIN TAKE 2 CAPSULES (600 MG TOTAL) BY MOUTH AT BEDTIME.   Insulin Pen Needle 32G X 6 MM Misc Commonly known as: NovoFine USE TO INJECT VICTOZA DAILY. DX: E11.9   Lancet Device Misc Use to check blood sugar twice daily.   metFORMIN 500 MG 24 hr tablet Commonly known as: GLUCOPHAGE-XR TAKE 2 TABLETS BY MOUTH TWICE A DAY What changed: when to take this   metoCLOPramide 10 MG tablet Commonly known as: Reglan Take 1 tablet (10 mg total) by mouth every 6 (six) hours as needed for up to 10 days for nausea or vomiting.   metoprolol succinate 100 MG 24 hr tablet Commonly known as: TOPROL-XL TAKE 1 TABLET (100 MG TOTAL) BY MOUTH DAILY. TAKE WITH OR IMMEDIATELY FOLLOWING A MEAL. What changed:  when to take this   Myrbetriq 25 MG Tb24 tablet Generic drug: mirabegron ER TAKE 1 TABLET BY MOUTH EVERY DAY What changed: how much to take   nitroGLYCERIN 0.4 MG SL tablet Commonly known as: NITROSTAT PLACE 1 TABLET (0.4 MG TOTAL) UNDER THE TONGUE EVERY 5 (FIVE) MINUTES AS NEEDED FOR CHEST PAIN What changed: See the new instructions.   ondansetron 4 MG tablet Commonly known as: Zofran Take 1 tablet (4 mg total) by mouth every 6 (six) hours as needed for nausea or vomiting.   OneTouch Ultra test strip Generic drug: glucose blood USE TO CHECK BLOOD SUGAR  TWICE DAILY   PRESERVISION AREDS PO Take 1 tablet by mouth 2 (two) times daily. Reported on 12/23/2015   simethicone 40 MG/0.6ML drops Commonly known as: MYLICON Take 1.2 mLs (80 mg total) by mouth 4 (four) times daily as needed for flatulence.   venlafaxine XR 75 MG 24 hr capsule Commonly known as: Effexor XR Take 1 capsule (75 mg total) by mouth daily with breakfast.   Victoza 18 MG/3ML Sopn Generic drug: liraglutide INJECT 1.8 MG INTO THE SKIN DAILY. What changed: See the new instructions.         Time coordinating discharge: 39 minutes  Signed:  Ciarra Braddy  [facility] 01/22/2020, 10:13 AM

## 2020-01-23 ENCOUNTER — Other Ambulatory Visit: Payer: Self-pay | Admitting: Family Medicine

## 2020-01-27 ENCOUNTER — Encounter: Payer: Medicare Other | Admitting: Family Medicine

## 2020-01-27 DIAGNOSIS — Z0289 Encounter for other administrative examinations: Secondary | ICD-10-CM

## 2020-01-30 ENCOUNTER — Other Ambulatory Visit: Payer: Self-pay | Admitting: Family Medicine

## 2020-01-30 NOTE — Telephone Encounter (Signed)
No showed for CPX.[name] needs to reschedule.  Once done forward back to me to refill med.

## 2020-01-30 NOTE — Telephone Encounter (Signed)
Cpx 3/26 Pt aware

## 2020-01-30 NOTE — Telephone Encounter (Signed)
Last office visit 01/19/2020 for Morgan [name] with Andrez Grime, LPN.  Last refilled 12/18/2019 for #60 with no refills.  No future appointment with PCP.

## 2020-02-05 ENCOUNTER — Ambulatory Visit: Payer: Medicare PPO | Admitting: Gastroenterology

## 2020-02-06 ENCOUNTER — Ambulatory Visit (INDEPENDENT_AMBULATORY_CARE_PROVIDER_SITE_OTHER): Payer: Medicare PPO | Admitting: Family Medicine

## 2020-02-06 ENCOUNTER — Encounter: Payer: Self-pay | Admitting: Family Medicine

## 2020-02-06 ENCOUNTER — Other Ambulatory Visit: Payer: Self-pay

## 2020-02-06 VITALS — BP 110/60 | HR 79 | Temp 97.7°F | Ht 61.75 in | Wt 151.5 lb

## 2020-02-06 DIAGNOSIS — Z0000 Encounter for general adult medical examination without abnormal findings: Secondary | ICD-10-CM | POA: Diagnosis not present

## 2020-02-06 DIAGNOSIS — I480 Paroxysmal atrial fibrillation: Secondary | ICD-10-CM | POA: Diagnosis not present

## 2020-02-06 DIAGNOSIS — F324 Major depressive disorder, single episode, in partial remission: Secondary | ICD-10-CM

## 2020-02-06 DIAGNOSIS — E1169 Type 2 diabetes mellitus with other specified complication: Secondary | ICD-10-CM

## 2020-02-06 DIAGNOSIS — I10 Essential (primary) hypertension: Secondary | ICD-10-CM | POA: Diagnosis not present

## 2020-02-06 DIAGNOSIS — E1159 Type 2 diabetes mellitus with other circulatory complications: Secondary | ICD-10-CM | POA: Diagnosis not present

## 2020-02-06 DIAGNOSIS — E785 Hyperlipidemia, unspecified: Secondary | ICD-10-CM | POA: Diagnosis not present

## 2020-02-06 DIAGNOSIS — C434 Malignant melanoma of scalp and neck: Secondary | ICD-10-CM

## 2020-02-06 NOTE — Assessment & Plan Note (Signed)
followed by derm.

## 2020-02-06 NOTE — Assessment & Plan Note (Signed)
Well controlled. Continue current medication.  

## 2020-02-06 NOTE — Patient Instructions (Addendum)
Keep working on healthy eating and regular exercise as tolerated.  

## 2020-02-06 NOTE — Progress Notes (Signed)
Chief Complaint  Patient presents with  . Annual Exam    Part 2    History of Present Illness: HPI  The patient presents for complete physical and review of chronic health problems. 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 Bryan Lemma on 01/21/2020 She is doing well... just on liquid and soft diet.  The patient saw a LPN or RN for medicare wellness visit.  Prevention and wellness was reviewed in detail. Note reviewed and important notes copied below. Health Maintenance: Foot exam- due Abnormal Screenings: none  02/06/20  Diabetes:   Good control on victoza and metformin. Lab Results  Component Value Date   HGBA1C 6.7 (H) 01/19/2020  Using medications without difficulties: Hypoglycemic episodes:none Hyperglycemic episodes:none Feet problems: no ulcer Blood Sugars averaging: FBS 110 eye exam within last year: yes  Hypertension:   BP Readings from Last 3 Encounters:  02/06/20 110/60  01/22/20 (!) 138/53  01/12/20 (!) 142/79  Using medication without problems or lightheadedness:  none Chest pain with exertion:none Edema:none Short of breath:none Average home BPs: Other issues:   Elevated Cholesterol: At goal on statin. Lab Results  Component Value Date   CHOL 125 01/19/2020   HDL 50.30 01/19/2020   LDLCALC 61 01/19/2020   TRIG 71.0 01/19/2020   CHOLHDL 2 01/19/2020  Using medications without problems: Muscle aches:  Diet compliance: Exercise: Other complaints:  AFib: last cardiology note reviewed.  Dr. Radford Pax manages CPAP.  Malignant melanoma: followed by derm. q 78months.  MDD  Stable control on  Venlafaxine and using clonazepam.. now using 1.5 tablets at night instead of 2 tabs.   Clinical Support from 01/19/2020 in [place] at [facility] Total Score  1       This visit occurred during the SARS-CoV-2 public  health emergency.  Safety protocols were in place, including screening questions prior to the visit, additional usage of staff PPE, and extensive cleaning of exam room while observing appropriate contact time as indicated for disinfecting solutions.   COVID 19 screen:  No recent travel or known exposure to COVID19 The patient denies respiratory symptoms of COVID 19 at this time. The importance of social distancing was discussed today.     Review of Systems  Constitutional: Negative for chills and fever.  HENT: Negative for congestion and ear pain.   Eyes: Negative for pain and redness.  Respiratory: Negative for cough and shortness of breath.   Cardiovascular: Negative for chest pain, palpitations and leg swelling.  Gastrointestinal: Negative for abdominal pain, blood in stool, constipation, diarrhea, nausea and vomiting.  Genitourinary: Negative for dysuria.  Musculoskeletal: Negative for falls and myalgias.  Skin: Negative for rash.  Neurological: Negative for dizziness.  Psychiatric/Behavioral: Negative for depression. The patient is not nervous/anxious.       Past Medical History:  Diagnosis Date  . Angina   . Arthritis   . Atrial fibrillation (HCC)    ASPIRIN FOR BLOOD THINNER  . Atypical mole 09/22/2016  . Bulging discs    lumbar   . Cancer ([place])    melanoma  . Complication of anesthesia    pt states has choking sensation with ET tube   . Coronary artery disease   . Depression   . Diabetes mellitus    diet controlled/on meds  . Family history of breast cancer   . Family history of colon cancer   . Fibromyalgia   . GERD (  gastroesophageal reflux disease)   . H/O hiatal hernia   . Headache(784.0)    "recurring"  . High cholesterol   . History of colon polyps   . Hyperlipidemia   . Hypertension   . 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 Migraines    "til ~ 1980"  . PONV (postoperative nausea and vomiting)   . Restless leg syndrome   . Sciatic nerve pain    "from pinched  nerve"  . Sleep apnea    uses CPAP  . Stroke [facility]) 2014   no deficits  . Weakness of right side of body    "I've had PT for it; they don't know what it's from".  CORTISONE INJECTION INTO BACK 08/30/12    reports that she has never smoked. She has never used smokeless tobacco. She reports current alcohol use of about 1.0 standard drinks of alcohol per week. She reports that she does not use drugs.   Current Outpatient Medications:  .  aspirin EC 81 MG tablet, Take 1 tablet (81 mg total) by mouth daily., Disp: , Rfl:  .  atorvastatin (LIPITOR) 80 MG tablet, TAKE 1 TABLET BY MOUTH EVERY DAY, Disp: 90 tablet, Rfl: 3 .  BLINK TEARS 0.25 % SOLN, Place 1 drop into both eyes 3 (three) times daily as needed (dry/irritated eyes.)., Disp: , Rfl:  .  calcium-vitamin D (OSCAL WITH D) 500-200 MG-UNIT tablet, Take 1 tablet by mouth 2 (two) times daily., Disp: , Rfl:  .  CARAFATE 1 GM/10ML suspension, TAKE 10 MLS (1 G TOTAL) BY MOUTH EVERY 6 (SIX) HOURS AS NEEDED., Disp: 420 mL, Rfl: 3 .  cetirizine (ZYRTEC) 10 MG tablet, Take 10 mg by mouth daily. , Disp: , Rfl:  .  chlorthalidone (HYGROTON) 25 MG tablet, TAKE 1 TABLET BY MOUTH EVERY DAY, Disp: 90 tablet, Rfl: 0 .  CINNAMON PO, Take 1,000 mg by mouth in the morning and at bedtime., Disp: , Rfl:  .  clonazePAM (KLONOPIN) 1 MG tablet, TAKE 2 TABLETS BY MOUTH AT BEDTIME, Disp: 60 tablet, Rfl: 0 .  dexlansoprazole (DEXILANT) 60 MG capsule, Take 1 capsule (60 mg total) by mouth in the morning and at bedtime for 14 days, THEN 1 capsule (60 mg total) daily for 7 days., Disp: 35 capsule, Rfl: 0 .  gabapentin (NEURONTIN) 300 MG capsule, TAKE 2 CAPSULES (600 MG TOTAL) BY MOUTH AT BEDTIME., Disp: 180 capsule, Rfl: 1 .  Insulin Pen Needle (NOVOFINE) 32G X 6 MM MISC, USE TO INJECT VICTOZA DAILY. DX: E11.9, Disp: 90 each, Rfl: 3 .  Lancet Device MISC, Use to check blood sugar twice daily., Disp: 1 each, Rfl: 0 .  Lancets (FREESTYLE) lancets, Use to check blood sugar  twice daily, Disp: 100 each, Rfl: 11 .  metFORMIN (GLUCOPHAGE-XR) 500 MG 24 hr tablet, TAKE 2 TABLETS BY MOUTH TWICE A DAY, Disp: 360 tablet, Rfl: 0 .  metoCLOPramide (REGLAN) 10 MG tablet, Take 1 tablet (10 mg total) by mouth every 6 (six) hours as needed for up to 10 days for nausea or vomiting., Disp: 40 tablet, Rfl: 0 .  metoprolol succinate (TOPROL-XL) 100 MG 24 hr tablet, TAKE 1 TABLET (100 MG TOTAL) BY MOUTH DAILY. TAKE WITH OR IMMEDIATELY FOLLOWING A MEAL., Disp: 90 tablet, Rfl: 1 .  Multiple Vitamins-Minerals (PRESERVISION AREDS PO), Take 1 tablet by mouth 2 (two) times daily. Reported on 12/23/2015, Disp: , Rfl:  .  MYRBETRIQ 25 MG TB24 tablet, TAKE 1 TABLET BY MOUTH EVERY DAY, Disp: 30 tablet,  Rfl: 2 .  nitroGLYCERIN (NITROSTAT) 0.4 MG SL tablet, PLACE 1 TABLET (0.4 MG TOTAL) UNDER THE TONGUE EVERY 5 (FIVE) MINUTES AS NEEDED FOR CHEST PAIN, Disp: 25 tablet, Rfl: 0 .  ondansetron (ZOFRAN) 4 MG tablet, Take 1 tablet (4 mg total) by mouth every 6 (six) hours as needed for nausea or vomiting., Disp: 30 tablet, Rfl: 0 .  ONETOUCH ULTRA test strip, USE TO CHECK BLOOD SUGAR TWICE DAILY, Disp: 150 strip, Rfl: 3 .  simethicone (MYLICON) 40 99991111 drops, Take 1.2 mLs (80 mg total) by mouth 4 (four) times daily as needed for flatulence., Disp: 30 mL, Rfl: 0 .  venlafaxine XR (EFFEXOR XR) 75 MG 24 hr capsule, Take 1 capsule (75 mg total) by mouth daily with breakfast., Disp: 30 capsule, Rfl: 5 .  VICTOZA 18 MG/3ML SOPN, INJECT 1.8 MG UNDER THE SKIN ONCE DAILY, Disp: 27 pen, Rfl: 3   Observations/Objective: Blood pressure 110/60, pulse 79, temperature 97.7 F (36.5 C), temperature source Temporal, height 5' 1.75" (1.568 m), weight 151 lb 8 oz (68.7 kg), SpO2 97 %.  Physical Exam Constitutional:      General: She is not in acute distress.    Appearance: Normal appearance. She is well-developed. She is not ill-appearing or toxic-appearing.     Comments: Elderly female in NAD  HENT:     Head:  Normocephalic.     Right Ear: Hearing, tympanic membrane, ear canal and external ear normal.     Left Ear: Hearing, tympanic membrane, ear canal and external ear normal.     Nose: Nose normal.  Eyes:     General: Lids are normal. Lids are everted, no foreign bodies appreciated.     Conjunctiva/sclera: Conjunctivae normal.     Pupils: Pupils are equal, round, and reactive to light.  Neck:     Thyroid: No thyroid mass or thyromegaly.     Vascular: No carotid bruit.     Trachea: Trachea normal.  Cardiovascular:     Rate and Rhythm: Normal rate and regular rhythm.     Heart sounds: Normal heart sounds, S1 normal and S2 normal. No murmur. No gallop.   Pulmonary:     Effort: Pulmonary effort is normal. No respiratory distress.     Breath sounds: Normal breath sounds. No wheezing, rhonchi or rales.  Abdominal:     General: Bowel sounds are normal. There is no distension or abdominal bruit.     Palpations: Abdomen is soft. There is no fluid wave or mass.     Tenderness: There is no abdominal tenderness. There is no guarding or rebound.     Hernia: No hernia is present.  Musculoskeletal:     Cervical back: Normal range of motion and neck supple.  Lymphadenopathy:     Cervical: No cervical adenopathy.  Skin:    General: Skin is warm and dry.     Findings: No rash.  Neurological:     Mental Status: She is alert.     Cranial Nerves: No cranial nerve deficit.     Sensory: No sensory deficit.  Psychiatric:        Mood and Affect: Mood is not anxious or depressed.        Speech: Speech normal.        Behavior: Behavior normal. Behavior is cooperative.        Judgment: Judgment normal.       Diabetic foot exam: Normal inspection No skin breakdown No calluses  Normal DP pulses Normal sensation  to light touch and monofilament Nails normal  Assessment and Plan The patient's preventative maintenance and recommended screening tests for an annual wellness exam were reviewed in full  today. Brought up to date unless services declined.  Counselled on the importance of diet, exercise, and its role in overall health and mortality. The patient's FH and SH was reviewed, including their home life, tobacco status, and drug and alcohol status.     Vaccines:uptodate Pap/DVE:No pap, DVEnot indicated... Asymptomatic. No family history of uterine or ovarian cancer. Mammo:06/2019, no yearly breast exam. Mother with breast cancer. 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  Paroxysmal A-fib ([place]) Stabel rate controlled followed by cardiology.e  Malignant melanoma ([place]) followed by derm.  Hypertension associated with diabetes ([place]) Well controlled. Continue current medication.   Hyperlipidemia associated with type 2 diabetes mellitus ([place]) Well controlled. Continue current medication.   Depression, major, in partial remission (HCC) Stable control on  Venlafaxine and using clonazepam twice daily  PDMP reviewed and unremarkbale.    Eliezer Lofts, MD

## 2020-02-06 NOTE — Assessment & Plan Note (Signed)
Stabel rate controlled followed by cardiology.e

## 2020-02-06 NOTE — Assessment & Plan Note (Signed)
Stable control on  Venlafaxine and using clonazepam twice daily  PDMP reviewed and unremarkbale.

## 2020-02-06 NOTE — Assessment & Plan Note (Signed)
Associated with TIA  Good control on victoza and metformin.

## 2020-02-07 ENCOUNTER — Other Ambulatory Visit: Payer: Self-pay | Admitting: Family Medicine

## 2020-02-19 ENCOUNTER — Other Ambulatory Visit: Payer: Self-pay | Admitting: Family Medicine

## 2020-02-20 ENCOUNTER — Encounter: Payer: Self-pay | Admitting: Gastroenterology

## 2020-02-20 ENCOUNTER — Ambulatory Visit: Payer: Medicare PPO | Admitting: Gastroenterology

## 2020-02-20 ENCOUNTER — Other Ambulatory Visit: Payer: Self-pay

## 2020-02-20 VITALS — BP 130/74 | HR 80 | Temp 97.1°F | Ht 62.5 in | Wt 149.2 lb

## 2020-02-20 DIAGNOSIS — Z9889 Other specified postprocedural states: Secondary | ICD-10-CM | POA: Diagnosis not present

## 2020-02-20 DIAGNOSIS — K219 Gastro-esophageal reflux disease without esophagitis: Secondary | ICD-10-CM | POA: Diagnosis not present

## 2020-02-20 DIAGNOSIS — R194 Change in bowel habit: Secondary | ICD-10-CM | POA: Diagnosis not present

## 2020-02-20 NOTE — Progress Notes (Signed)
P  Chief Complaint:    Postoperative follow-up  GI History: 79 year old female with longstanding history of reflux and small hiatal hernia, now s/p Transoral Incisionless Fundoplication (TIF)on 0000000.  Prior to TIF, had trialed multiple acid suppression medications, most recently Dexilant 60 mg/day, which improves her symptoms, but with increasing breakthrough of pyrosis. Of note, recent DEXA with osteopenia and did have a recent fracture of left elbow.  GERD history: -Index symptoms:Regurgitation, pyrosis. +nocturnal sxs -Current medications:Dexilant 60 mg/day, Carafate -Complications:2 cm hiatal hernia  GERD evaluation: -LastEGD:06/2019 -Barium esophagram:04/2018: Tiny HH, otherwise normal. Normal motility -Esophageal Manometry:?Jackhammer esophagus with hypertensive LES, normal IRP, no achalasia in 2017. Repeat a.m. this month was normal -pH/Impedance:2017. De[place]ter score 40 with majority of reflux episodes being acidic. SI 75%   Endoscopic history: -EGD (12/2015): Small HH, numerous fundic gland polyps -EGD (06/2019, Dr. Havery Moros): 2 cm HH, Hill Grade 2 valve, fundic gland polyps -EGD with TIF (01/21/2020, Dr. Bryan Lemma): 24 fasteners placed with 270 degree valve, 3 cm in length.  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 Follows with Dr. Marlou Porch, Cardiology. Currently taking ASA 81 mg. 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  HPI:     Patient is a 79 y.o. female presenting to the [facility]ology [facility] for follow-up.  EGD with TIF completed on 0000000 without complications.  She was discharged the following day, and has since continued to take [place] as prescribed.  Slowly increasing her diet and activity regimen per post fundoplication protocol.  Reflux symptoms otherwise completely controlled since TIF.  Planning to advance diet today from soft foods to solids. No  issues tolerating PO to date.   Prescribed Zofran and Reglan, but not taking either.   Still with intermittent loose stools. Does not currently use a fiber supplement.  No hematochezia or melena.  No fever or chills.  Otherwise feels well.   Review of systems:     No chest pain, no SOB, no fevers, no urinary sx   Past Medical History:  Diagnosis Date  . Angina   . Arthritis   . Atrial fibrillation (HCC)    ASPIRIN FOR BLOOD THINNER  . Atypical mole 09/22/2016  . Bulging discs    lumbar   . Cancer ([place])    melanoma  . Complication of anesthesia    pt states has choking sensation with ET tube   . Coronary artery disease   . Depression   . Diabetes mellitus    diet controlled/on meds  . Family history of breast cancer   . Family history of colon cancer   . Fibromyalgia   . GERD (gastroesophageal reflux disease)   . H/O hiatal hernia   . Headache(784.0)    "recurring"  . High cholesterol   . History of colon polyps   . Hyperlipidemia   . Hypertension   . 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 Migraines    "til ~ 1980"  . PONV (postoperative nausea and vomiting)   . Restless leg syndrome   . Sciatic nerve pain    "from pinched nerve"  . Sleep apnea    uses CPAP  . Stroke [facility]) 2014   no deficits  . Weakness of right side of body    "I've had PT for it; they don't know what it's from".  CORTISONE INJECTION INTO BACK 08/30/12    Patient's surgical history, family medical history, social history, medications and allergies were  all reviewed in Epic    Current Outpatient Medications  Medication Sig Dispense Refill  . aspirin EC 81 MG tablet Take 1 tablet (81 mg total) by mouth daily.    [name] atorvastatin (LIPITOR) 80 MG tablet TAKE 1 TABLET BY MOUTH EVERY DAY 90 tablet 3  . BLINK TEARS 0.25 % SOLN Place 1 drop into both eyes 3 (three) times daily as needed (dry/irritated eyes.).    [name] calcium-vitamin D (OSCAL WITH D) 500-200 MG-UNIT tablet Take 1 tablet by mouth 2 (two) times daily.     [name] CARAFATE 1 GM/10ML suspension TAKE 10 MLS (1 G TOTAL) BY MOUTH EVERY 6 (SIX) HOURS AS NEEDED. 420 mL 3  . cetirizine (ZYRTEC) 10 MG tablet Take 10 mg by mouth daily.     . chlorthalidone (HYGROTON) 25 MG tablet TAKE 1 TABLET BY MOUTH EVERY DAY 90 tablet 0  . CINNAMON PO Take 1,000 mg by mouth in the morning and at bedtime.    . clonazePAM (KLONOPIN) 1 MG tablet TAKE 2 TABLETS BY MOUTH AT BEDTIME 60 tablet 0  . dexlansoprazole (DEXILANT) 60 MG capsule Take 1 capsule (60 mg total) by mouth in the morning and at bedtime for 14 days, THEN 1 capsule (60 mg total) daily for 7 days. 35 capsule 0  . gabapentin (NEURONTIN) 300 MG capsule TAKE 2 CAPSULES (600 MG TOTAL) BY MOUTH AT BEDTIME. 180 capsule 1  . Insulin Pen Needle (NOVOFINE) 32G X 6 MM MISC USE TO INJECT VICTOZA DAILY. DX: E11.9 90 each 3  . Lancet Device MISC Use to check blood sugar twice daily. 1 each 0  . Lancets (FREESTYLE) lancets Use to check blood sugar twice daily 100 each 11  . metFORMIN (GLUCOPHAGE-XR) 500 MG 24 hr tablet TAKE 2 TABLETS BY MOUTH TWICE A DAY 360 tablet 0  . metoCLOPramide (REGLAN) 10 MG tablet Take 1 tablet (10 mg total) by mouth every 6 (six) hours as needed for up to 10 days for nausea or vomiting. 40 tablet 0  . metoprolol succinate (TOPROL-XL) 100 MG 24 hr tablet TAKE 1 TABLET (100 MG TOTAL) BY MOUTH DAILY. TAKE WITH OR IMMEDIATELY FOLLOWING A MEAL. 90 tablet 3  . Multiple Vitamins-Minerals (PRESERVISION AREDS PO) Take 1 tablet by mouth 2 (two) times daily. Reported on 12/23/2015    . MYRBETRIQ 25 MG TB24 tablet TAKE 1 TABLET BY MOUTH EVERY DAY 30 tablet 2  . nitroGLYCERIN (NITROSTAT) 0.4 MG SL tablet PLACE 1 TABLET (0.4 MG TOTAL) UNDER THE TONGUE EVERY 5 (FIVE) MINUTES AS NEEDED FOR CHEST PAIN 25 tablet 0  . ondansetron (ZOFRAN) 4 MG tablet Take 1 tablet (4 mg total) by mouth every 6 (six) hours as needed for nausea or vomiting. 30 tablet 0  . ONETOUCH ULTRA test strip USE TO CHECK BLOOD SUGAR TWICE DAILY 150 strip  3  . simethicone (MYLICON) 40 99991111 drops Take 1.2 mLs (80 mg total) by mouth 4 (four) times daily as needed for flatulence. 30 mL 0  . venlafaxine XR (EFFEXOR-XR) 75 MG 24 hr capsule TAKE 1 CAPSULE (75 MG TOTAL) BY MOUTH DAILY WITH BREAKFAST. 90 capsule 1  . VICTOZA 18 MG/3ML SOPN INJECT 1.8 MG UNDER THE SKIN ONCE DAILY 27 pen 3   No current facility-administered medications for this visit.    Physical Exam:     BP 130/74   Pulse 80   Temp (!) 97.1 F (36.2 C)   Ht 5' 2.5" (1.588 m)   Wt 149 lb 4 oz (  67.7 kg)   BMI 26.86 kg/m   GENERAL:  Pleasant female in NAD PSYCH: : Cooperative, normal affect EENT:  conjunctiva pink, mucous membranes moist, neck supple without masses CARDIAC:  RRR, no murmur heard, no peripheral edema PULM: Normal respiratory effort, lungs CTA bilaterally, no wheezing ABDOMEN:  Nondistended, soft, nontender. No obvious masses, no hepatomegaly,  normal bowel sounds SKIN:  turgor, no lesions seen Musculoskeletal:  Normal muscle tone, normal strength NEURO: Alert and oriented x 3, no focal neurologic deficits   IMPRESSION and PLAN:    1) GERD 2) History of hiatal hernia 3) History of fundoplication -GERD and small hiatal hernia both corrected with TIF on 01/21/2020.  No breakthrough reflux symptoms since surgery. -Plan to [place] today.  60 mg qod x2 weeks, then discontinue -Discussed hyperacid secretion/rebound acid with medication titration.  Okay to use Tums or other OTC antacids as needed during titration -Advancing diet  4) Loose stools -Monitor for clinical improvement when able to titrate off Dexilant completely -Discussed adding fiber supplement today.  If no significant improvement after med titration, plan to add supplemental fiber  RTC in 3-6 months or sooner as needed          Lavena Bullion ,DO, FACG 02/20/2020, 2:56 PM

## 2020-02-20 NOTE — Patient Instructions (Signed)
Return in 3-6 months  It was a pleasure to see you today!  Vito Cirigliano, D.O.

## 2020-03-01 ENCOUNTER — Encounter: Payer: Self-pay | Admitting: Cardiology

## 2020-03-01 ENCOUNTER — Other Ambulatory Visit: Payer: Self-pay

## 2020-03-01 ENCOUNTER — Ambulatory Visit: Payer: Medicare PPO | Admitting: Cardiology

## 2020-03-01 VITALS — BP 102/62 | HR 72 | Ht 62.5 in | Wt 146.6 lb

## 2020-03-01 DIAGNOSIS — I10 Essential (primary) hypertension: Secondary | ICD-10-CM | POA: Diagnosis not present

## 2020-03-01 DIAGNOSIS — E7800 Pure hypercholesterolemia, unspecified: Secondary | ICD-10-CM

## 2020-03-01 DIAGNOSIS — G4733 Obstructive sleep apnea (adult) (pediatric): Secondary | ICD-10-CM

## 2020-03-01 DIAGNOSIS — I2510 Atherosclerotic heart disease of native coronary artery without angina pectoris: Secondary | ICD-10-CM | POA: Diagnosis not present

## 2020-03-01 DIAGNOSIS — E669 Obesity, unspecified: Secondary | ICD-10-CM

## 2020-03-01 DIAGNOSIS — I2583 Coronary atherosclerosis due to lipid rich plaque: Secondary | ICD-10-CM | POA: Diagnosis not present

## 2020-03-01 DIAGNOSIS — E119 Type 2 diabetes mellitus without complications: Secondary | ICD-10-CM

## 2020-03-01 NOTE — Patient Instructions (Signed)
Medication Instructions:  The current medical regimen is effective;  continue present plan and medications.  *If you need a refill on your cardiac medications before your next appointment, please call your pharmacy*  Follow-Up: At [facility], you and your health needs are our priority.  As part of our continuing mission to provide you with exceptional heart care, we have created designated Provider Care Teams.  These Care Teams include your primary Cardiologist (physician) and Advanced Practice Providers (APPs -  Physician Assistants and Nurse Practitioners) who all work together to provide you with the care you need, when you need it.  We recommend signing up for the patient portal called "MyChart".  Sign up information is provided on this After Visit Summary.  MyChart is used to connect with patients for Virtual Visits (Telemedicine).  Patients are able to view lab/test results, encounter notes, upcoming appointments, etc.  Non-urgent messages can be sent to your provider as well.   To learn more about what you can do with MyChart, go to [email or web address].    Your next appointment:   12 month(s)  The format for your next appointment:   In Person  Provider:   Mark Skains, MD   Thank you for choosing [facility]!!      

## 2020-03-01 NOTE — Progress Notes (Signed)
Cardiology Office Note:    Date:  03/01/2020   ID:  Morgan [name], DOB 12-06-1940, MRN [id number]  PCP:  Jinny Sanders, MD  Cardiologist:  Candee Furbish, MD  Electrophysiologist:  None   Referring MD: Jinny Sanders, MD    History of Present Illness:    Morgan [name] is a 79 y.o. female here for follow-up of coronary artery disease status post CABG in 2006, after CABG had diagonal stent placed, cerebellar stroke in 2015, prior angioedema, right posterior neck melanoma discovered by Dr. Radford Pax.  Has had some prior mild shortness of breath attributed to deconditioning.  Prior echocardiogram has been reassuring.  Nuclear stress test in 2017 reassuring.  Underwent esophageal procedure in March 2021.  Transoral fundoplication. Put a small band around. On diet. Watching diet. Had prior GERD and polyps.   Past Medical History:  Diagnosis Date  . Angina   . Arthritis   . Atrial fibrillation (HCC)    ASPIRIN FOR BLOOD THINNER  . Atypical mole 09/22/2016  . Bulging discs    lumbar   . Cancer ([place])    melanoma  . Complication of anesthesia    pt states has choking sensation with ET tube   . Coronary artery disease   . Depression   . Diabetes mellitus    diet controlled/on meds  . Family history of breast cancer   . Family history of colon cancer   . Fibromyalgia   . GERD (gastroesophageal reflux disease)   . H/O hiatal hernia   . Headache(784.0)    "recurring"  . High cholesterol   . History of colon polyps   . Hyperlipidemia   . Hypertension   . 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 Migraines    "til ~ 1980"  . PONV (postoperative nausea and vomiting)   . Restless leg syndrome   . Sciatic nerve pain    "from pinched nerve"  . Sleep apnea    uses CPAP  . Stroke [facility]) 2014   no deficits  . Weakness of right side of body    "I've had PT for it; they don't know what it's from".  CORTISONE INJECTION INTO BACK 08/30/12    Past Surgical History:  Procedure Laterality Date  .  [place] STUDY N/A 12/29/2015   Procedure: [place] STUDY;  Surgeon: Manus Gunning, MD;  Location: [facility];  Service: Gastroenterology;  Laterality: N/A;  . CARPAL TUNNEL RELEASE Left 07/02/2015   Procedure: CARPAL TUNNEL RELEASE;  Surgeon: Frederik Pear, MD;  Location: [facility];  Service: Orthopedics;  Laterality: Left;  . CATARACT EXTRACTION, BILATERAL Bilateral    Oct and Nov 2017  . CORONARY ANGIOPLASTY WITH STENT PLACEMENT  06/2011   "1"  . CORONARY ARTERY BYPASS GRAFT  2005   CABG X 2  . DILATION AND CURETTAGE OF UTERUS     "more than once"  . ELBOW ARTHROSCOPY Left 07/02/2015   Procedure: ARTHROSCOPY LEFT ELBOW WITH DEBRIDEMENT AND REMOVAL LOOSE BODY;  Surgeon: Frederik Pear, MD;  Location: [facility];  Service: Orthopedics;  Laterality: Left;  . ESOPHAGEAL DILATION  01/21/2020   Procedure: ESOPHAGEAL DILATION;  Surgeon: Lavena Bullion, DO;  Location: [facility];  Service: Gastroenterology;;  . ESOPHAGEAL MANOMETRY N/A 12/29/2015   Procedure: ESOPHAGEAL MANOMETRY (EM);  Surgeon: Manus Gunning, MD;  [facility]: [facility];  Service: Gastroenterology;  Laterality: N/A;  . ESOPHAGEAL MANOMETRY N/A 07/30/2019   Procedure: ESOPHAGEAL MANOMETRY (EM);  Surgeon: Havery Moros,  Carlota Raspberry, MD;  Location: [facility];  Service: Gastroenterology;  Laterality: N/A;  . ESOPHAGOGASTRODUODENOSCOPY (EGD) WITH PROPOFOL N/A 01/21/2020   Procedure: ESOPHAGOGASTRODUODENOSCOPY (EGD) WITH PROPOFOL;  Surgeon: Lavena Bullion, DO;  [facility]: [facility];  Service: Gastroenterology;  Laterality: N/A;  . FRACTURE SURGERY  ~ 2005   nose  . KNEE ARTHROSCOPY  09/04/2012   Procedure: ARTHROSCOPY KNEE;  Surgeon: Gearlean Alf, MD;  Location: [facility];  Service: Orthopedics;  Laterality: Right;  right knee arthroscopy with medial and lateral meniscus debridement  . 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 MOUTH SURGERY  2004   "bone  replacement; had cadavear bones put in; face was collapsing"  . NASAL SEPTUM SURGERY  ~ 1986  . TOTAL KNEE ARTHROPLASTY Right 07/07/2013   Procedure: RIGHT TOTAL KNEE ARTHROPLASTY;  Surgeon: Gearlean Alf, MD;  [facility];  Service: Orthopedics;  Laterality: Right;  . TRANSORAL INCISIONLESS FUNDOPLICATION N/A 0000000   Procedure: TRANSORAL INCISIONLESS FUNDOPLICATION;  Surgeon: Lavena Bullion, DO;  Location: [facility];  Service: Gastroenterology;  Laterality: N/A;    Current Medications: Current Meds  Medication Sig  . aspirin EC 81 MG tablet Take 1 tablet (81 mg total) by mouth daily.  [name] atorvastatin (LIPITOR) 80 MG tablet TAKE 1 TABLET BY MOUTH EVERY DAY  . BLINK TEARS 0.25 % SOLN Place 1 drop into both eyes 3 (three) times daily as needed (dry/irritated eyes.).  [name] calcium-vitamin D (OSCAL WITH D) 500-200 MG-UNIT tablet Take 1 tablet by mouth 2 (two) times daily.  [name] CARAFATE 1 GM/10ML suspension TAKE 10 MLS (1 G TOTAL) BY MOUTH EVERY 6 (SIX) HOURS AS NEEDED.  [name] cetirizine (ZYRTEC) 10 MG tablet Take 10 mg by mouth daily.   . chlorthalidone (HYGROTON) 25 MG tablet TAKE 1 TABLET BY MOUTH EVERY DAY  . CINNAMON PO Take 1,000 mg by mouth in the morning and at bedtime.  . clonazePAM (KLONOPIN) 1 MG tablet TAKE 2 TABLETS BY MOUTH AT BEDTIME  . dexlansoprazole (DEXILANT) 60 MG capsule Take 1 capsule (60 mg total) by mouth in the morning and at bedtime for 14 days, THEN 1 capsule (60 mg total) daily for 7 days.  [name] gabapentin (NEURONTIN) 300 MG capsule TAKE 2 CAPSULES (600 MG TOTAL) BY MOUTH AT BEDTIME.  . Insulin Pen Needle (NOVOFINE) 32G X 6 MM MISC USE TO INJECT VICTOZA DAILY. DX: E11.9  . Lancet Device MISC Use to check blood sugar twice daily.  . Lancets (FREESTYLE) lancets Use to check blood sugar twice daily  . metFORMIN (GLUCOPHAGE-XR) 500 MG 24 hr tablet TAKE 2 TABLETS BY MOUTH TWICE A DAY  . metoprolol succinate (TOPROL-XL) 100 MG 24 hr tablet TAKE 1 TABLET (100 MG TOTAL)  BY MOUTH DAILY. TAKE WITH OR IMMEDIATELY FOLLOWING A MEAL.  . Multiple Vitamins-Minerals (PRESERVISION AREDS PO) Take 1 tablet by mouth 2 (two) times daily. Reported on 12/23/2015  . MYRBETRIQ 25 MG TB24 tablet TAKE 1 TABLET BY MOUTH EVERY DAY  . nitroGLYCERIN (NITROSTAT) 0.4 MG SL tablet PLACE 1 TABLET (0.4 MG TOTAL) UNDER THE TONGUE EVERY 5 (FIVE) MINUTES AS NEEDED FOR CHEST PAIN  . ONETOUCH ULTRA test strip USE TO CHECK BLOOD SUGAR TWICE DAILY  . simethicone (MYLICON) 40 99991111 drops Take 1.2 mLs (80 mg total) by mouth 4 (four) times daily as needed for flatulence.  . venlafaxine XR (EFFEXOR-XR) 75 MG 24 hr capsule TAKE 1 CAPSULE (75 MG TOTAL) BY MOUTH DAILY WITH BREAKFAST.  [name]  VICTOZA 18 MG/3ML SOPN INJECT 1.8 MG UNDER THE SKIN ONCE DAILY     Allergies:   Hydrocodone-acetaminophen, Percocet [oxycodone-acetaminophen], and Sulfa antibiotics   Social History   Socioeconomic History  . Marital status: Married    Spouse name: Not on file  . Number of children: 2  . Years of education: college  . Highest education level: Not on file  Occupational History  . Occupation: Retired   Tobacco Use  . Smoking status: Never Smoker  . Smokeless tobacco: Never Used  Substance and Sexual Activity  . Alcohol use: Yes    Alcohol/week: 1.0 standard drinks    Types: 1 Glasses of wine per week    Comment: "occasionally drink wine"  . Drug use: No  . Sexual activity: Yes  Other Topics Concern  . Not on file  Social History Narrative   Drinks 1 cup of coffee a day    Social Determinants of Health   Financial Resource Strain: Low Risk   . Difficulty of Paying Living Expenses: Not hard at all  Food Insecurity: No Food Insecurity  . Worried About Charity fundraiser in the Last Year: Never true  . Ran Out of Food in the Last Year: Never true  Transportation Needs: No Transportation Needs  . Lack of Transportation (Medical): No  . Lack of Transportation (Non-Medical): No  Physical Activity:  Inactive  . Days of Exercise per Week: 0 days  . Minutes of Exercise per Session: 0 min  Stress: No Stress Concern Present  . Feeling of Stress : Not at all  Social Connections:   . Frequency of Communication with Friends and Family:   . Frequency of Social Gatherings with Friends and Family:   . Attends Religious Services:   . Active Member of Clubs or Organizations:   . Attends Archivist Meetings:   [name] Marital Status:      Family History: The patient's family history includes Breast cancer (age of onset: 52) in her mother; Cancer (age of onset: 12) in her mother and sister; Colon cancer in her sister; Dementia in her sister; Diabetes in her sister; GER disease in her daughter; Heart disease in her brother; Heart disease (age of onset: 89) in her father; Hypertension in her brother, daughter, and sister. There is no history of Heart attack or Stroke.  ROS:   Please see the history of present illness.    No fevers chills nausea vomiting syncope bleeding all other systems reviewed and are negative.  EKGs/Labs/Other Studies Reviewed:    The following studies were reviewed today:  Echo 99991111 -EF [id number] diastolic dysfunction with elevated left atrial filling pressures PA pressures 31, unchanged from prior  EKG:  EKG is not ordered today.  Prior 71 sinus rhythm nonspecific T wave changes  Recent Labs: 01/19/2020: ALT 21 01/22/2020: BUN 25; Creatinine, Ser 0.85; Hemoglobin 9.5; Platelets 325; Potassium 4.2; Sodium 140  Recent Lipid Panel    Component Value Date/Time   CHOL 125 01/19/2020 0851   TRIG 71.0 01/19/2020 0851   HDL 50.30 01/19/2020 0851   CHOLHDL 2 01/19/2020 0851   VLDL 14.2 01/19/2020 0851   LDLCALC 61 01/19/2020 0851    Physical Exam:    VS:  BP 102/62   Pulse 72   Ht 5' 2.5" (1.588 m)   Wt 146 lb 9.6 oz (66.5 kg)   BMI 26.39 kg/m     Wt Readings from Last 3 Encounters:  03/01/20 146 lb 9.6 oz (66.5 kg)  02/20/20 149 lb 4 oz (67.7 kg)  02/06/20 151  lb 8 oz (68.7 kg)     GEN:  Well nourished, well developed in no acute distress, gait slightly slow HEENT: Normal NECK: No JVD; No carotid bruits LYMPHATICS: No lymphadenopathy CARDIAC: RRR, no murmurs, rubs, gallops RESPIRATORY:  Clear to auscultation without rales, wheezing or rhonchi  ABDOMEN: Soft, non-tender, non-distended MUSCULOSKELETAL:  No edema; No deformity  SKIN: Warm and dry NEUROLOGIC:  Alert and oriented x 3 PSYCHIATRIC:  Normal affect   ASSESSMENT:    1. Coronary artery disease due to lipid rich plaque   2. Obstructive sleep apnea   3. Essential hypertension   4. Obesity (BMI 30-39.9)   5. Pure hypercholesterolemia   6. Diabetes mellitus with coincident hypertension (HCC)    PLAN:    In order of problems listed above:  Coronary artery disease status post bypass -Had subsequent angina and had diagonal stent placed. -She has continued with high-dose aspirin because of prior stroke.  Disequilibrium -Prior cerebellar stroke.  Physical therapy previously.  She has fallen unfortunately.  Broke her arm.  Hyperlipidemia -Continue with high intensity dose of atorvastatin, LDL in the 50s.  Diastolic dysfunction with elevated left atrial filling pressures -Hydrochlorothiazide was added at 1 point then stopped subsequently. Now on chlorthalidone.   Continue with good blood pressure control.  Continue with exercise and conditioning efforts. Very mild dyspnea if walking around block.   Diabetes with hypertension -Well-controlled.  Hemoglobin A1c 6.3.  Obstructive sleep apnea -CPAP Dr. Radford Pax  GERD, polyps -Procedure 3/21-esophageal transoral fundoplication.  Eating thins.  Doing well.  Lost some weight.   Medication Adjustments/Labs and Tests Ordered: Current medicines are reviewed at length with the patient today.  Concerns regarding medicines are outlined above.  No orders of the defined types were placed in this encounter.  No orders of the defined types  were placed in this encounter.   Patient Instructions  Medication Instructions:  The current medical regimen is effective;  continue present plan and medications.  *If you need a refill on your cardiac medications before your next appointment, please call your pharmacy*  Follow-Up: At [facility], you and your health needs are our priority.  As part of our continuing mission to provide you with exceptional heart care, we have created designated Provider Care Teams.  These Care Teams include your primary Cardiologist (physician) and Advanced Practice Providers (APPs -  Physician Assistants and Nurse Practitioners) who all work together to provide you with the care you need, when you need it.  We recommend signing up for the patient portal called "MyChart".  Sign up information is provided on this After Visit Summary.  MyChart is used to connect with patients for Virtual Visits (Telemedicine).  Patients are able to view lab/test results, encounter notes, upcoming appointments, etc.  Non-urgent messages can be sent to your provider as well.   To learn more about what you can do with MyChart, go to [email or web address].    Your next appointment:   12 month(s)  The format for your next appointment:   In Person  Provider:   Candee Furbish, MD  Thank you for choosing [facility]!!         Signed, Candee Furbish, MD  03/01/2020 9:03 AM    [place]

## 2020-03-11 ENCOUNTER — Other Ambulatory Visit: Payer: Self-pay | Admitting: Family Medicine

## 2020-03-17 ENCOUNTER — Other Ambulatory Visit: Payer: Self-pay | Admitting: Family Medicine

## 2020-03-22 DIAGNOSIS — H35372 Puckering of macula, left eye: Secondary | ICD-10-CM | POA: Diagnosis not present

## 2020-03-22 DIAGNOSIS — H43813 Vitreous degeneration, bilateral: Secondary | ICD-10-CM | POA: Diagnosis not present

## 2020-03-22 DIAGNOSIS — H353133 Nonexudative age-related macular degeneration, bilateral, advanced atrophic without subfoveal involvement: Secondary | ICD-10-CM | POA: Diagnosis not present

## 2020-03-30 ENCOUNTER — Other Ambulatory Visit: Payer: Self-pay | Admitting: Family Medicine

## 2020-04-01 ENCOUNTER — Other Ambulatory Visit: Payer: Self-pay | Admitting: Family Medicine

## 2020-04-01 NOTE — Telephone Encounter (Signed)
Last office visit 02/06/2020 for CPE.  Last refilled 01/30/2020 for #60 with no refills.  Next Appt: 08/10/20 for 6 month follow up.

## 2020-04-06 ENCOUNTER — Other Ambulatory Visit: Payer: Self-pay | Admitting: Family Medicine

## 2020-04-27 DIAGNOSIS — L821 Other seborrheic keratosis: Secondary | ICD-10-CM | POA: Diagnosis not present

## 2020-04-27 DIAGNOSIS — D2261 Melanocytic nevi of right upper limb, including shoulder: Secondary | ICD-10-CM | POA: Diagnosis not present

## 2020-04-27 DIAGNOSIS — D2371 Other benign neoplasm of skin of right lower limb, including hip: Secondary | ICD-10-CM | POA: Diagnosis not present

## 2020-04-27 DIAGNOSIS — Z85820 Personal history of malignant melanoma of skin: Secondary | ICD-10-CM | POA: Diagnosis not present

## 2020-04-27 DIAGNOSIS — D225 Melanocytic nevi of trunk: Secondary | ICD-10-CM | POA: Diagnosis not present

## 2020-04-27 DIAGNOSIS — L812 Freckles: Secondary | ICD-10-CM | POA: Diagnosis not present

## 2020-04-27 DIAGNOSIS — D2262 Melanocytic nevi of left upper limb, including shoulder: Secondary | ICD-10-CM | POA: Diagnosis not present

## 2020-05-03 ENCOUNTER — Other Ambulatory Visit: Payer: Self-pay | Admitting: Family Medicine

## 2020-05-03 NOTE — Telephone Encounter (Signed)
Last office visit 02/06/2020 for CPE.  Last refilled 04/01/2020 for #60 with no refills.  Next Appt: 08/10/2020 for 6 month follow up.

## 2020-05-26 ENCOUNTER — Telehealth: Payer: Self-pay | Admitting: Gastroenterology

## 2020-05-26 ENCOUNTER — Encounter: Payer: Self-pay | Admitting: Gastroenterology

## 2020-05-26 NOTE — Telephone Encounter (Signed)
LMOM for patient to call back.

## 2020-05-27 NOTE — Telephone Encounter (Signed)
Patient states that she has had very mild reflux upon waking in the morning lasting until 10 am. Her symptoms started last week and improved today. Patient had the TIF procedure in 10/2019 with great response. She is feeling better now and will contact the office if her symptoms return again.

## 2020-06-09 DIAGNOSIS — G4733 Obstructive sleep apnea (adult) (pediatric): Secondary | ICD-10-CM | POA: Diagnosis not present

## 2020-06-30 ENCOUNTER — Other Ambulatory Visit: Payer: Self-pay | Admitting: Family Medicine

## 2020-06-30 NOTE — Telephone Encounter (Signed)
Last office visit 02/06/2020 for CPE.  Last refilled 12/24/2019 for #180 with 1 refill.  Next Appt: 08/10/2020 for 6 month follow up.

## 2020-07-08 ENCOUNTER — Other Ambulatory Visit: Payer: Self-pay

## 2020-07-08 ENCOUNTER — Ambulatory Visit: Payer: Medicare PPO | Admitting: Primary Care

## 2020-07-08 ENCOUNTER — Encounter: Payer: Self-pay | Admitting: Primary Care

## 2020-07-08 VITALS — BP 116/78 | HR 78 | Temp 96.6°F | Ht 62.5 in | Wt 147.5 lb

## 2020-07-08 DIAGNOSIS — R350 Frequency of micturition: Secondary | ICD-10-CM

## 2020-07-08 LAB — POC URINALSYSI DIPSTICK (AUTOMATED)
Bilirubin, UA: NEGATIVE
Glucose, UA: NEGATIVE
Ketones, UA: NEGATIVE
Nitrite, UA: POSITIVE
Protein, UA: POSITIVE — AB
Spec Grav, UA: 1.02 (ref 1.010–1.025)
Urobilinogen, UA: 0.2 E.U./dL
pH, UA: 6 (ref 5.0–8.0)

## 2020-07-08 MED ORDER — NITROFURANTOIN MONOHYD MACRO 100 MG PO CAPS: 100.0000 mg | ORAL_CAPSULE | Freq: Two times a day (BID) | ORAL | 0 refills | Status: AC

## 2020-07-08 NOTE — Patient Instructions (Signed)
Start Macrobid (nitrofurantoin) tablets for urinary tract infection. Take 1 tablet by mouth twice daily for five days.  Be sure to drink plenty of water to flush out the bacteria and stay hydrated.  Please notify us if no improvement in symptoms within 2-3 days.  It was a pleasure meeting you!

## 2020-07-08 NOTE — Progress Notes (Signed)
Subjective:    Patient ID: Morgan [name], female    DOB: 04-Mar-1941, 79 y.o.   MRN: [id number]  HPI  This visit occurred during the SARS-CoV-2 public health emergency.  Safety protocols were in place, including screening questions prior to the visit, additional usage of staff PPE, and extensive cleaning of exam room while observing appropriate contact time as indicated for disinfecting solutions.   [name] is a 79 year old female patient of Dr. Diona Browner with an extensive medical history including CAD, diabetes, urge incontinence, fibromyalgia who presents today with a chief complaint of urinary frequency.  She also endorses lower back ache, slight suprapubic pressure. She denies hematuria, nausea, fevers, flank pain, dizziness. Symptoms began about two weeks ago. She's not taken anything OTC for her symptoms. She does not drink much water, very little actually. She drinks mostly cranberry juice, sweet tea, coffee.   BP Readings from Last 3 Encounters:  07/08/20 116/78  03/01/20 102/62  02/20/20 130/74     Review of Systems  Constitutional: Negative for fever.  Gastrointestinal: Negative for abdominal pain.  Genitourinary: Positive for frequency. Negative for dysuria, flank pain, hematuria, pelvic pain and vaginal discharge.  Musculoskeletal: Positive for back pain.       Past Medical History:  Diagnosis Date   Angina    Arthritis    Atrial fibrillation ([place])    ASPIRIN FOR BLOOD THINNER   Atypical mole 09/22/2016   Bulging discs    lumbar    Cancer ([place])    melanoma   Complication of anesthesia    pt states has choking sensation with ET tube    Coronary artery disease    Depression    Diabetes mellitus    diet controlled/on meds   Family history of breast cancer    Family history of colon cancer    Fibromyalgia    GERD (gastroesophageal reflux disease)    H/O hiatal hernia    Headache(784.0)    "recurring"   High cholesterol    History of  colon polyps    Hyperlipidemia    Hypertension    Jackhammer esophagus    Migraines    "til ~ 1980"   PONV (postoperative nausea and vomiting)    Restless leg syndrome    Sciatic nerve pain    "from pinched nerve"   Sleep apnea    uses CPAP   Stroke ([place]) 2014   no deficits   Weakness of right side of body    "I've had PT for it; they don't know what it's from".  CORTISONE INJECTION INTO BACK 08/30/12     Social History   Socioeconomic History   Marital status: Married    Spouse name: Not on file   Number of children: 2   Years of education: college   Highest education level: Not on file  Occupational History   Occupation: Retired   Tobacco Use   Smoking status: Never Smoker   Smokeless tobacco: Never Used  Scientific laboratory technician Use: Never used  Substance and Sexual Activity   Alcohol use: Yes    Alcohol/week: 1.0 standard drink    Types: 1 Glasses of wine per week    Comment: "occasionally drink wine"   Drug use: No   Sexual activity: Yes  Other Topics Concern   Not on file  Social History Narrative   Drinks 1 cup of coffee a day    Social Determinants of Health   Financial Resource Strain: Low Risk  Difficulty of Paying Living Expenses: Not hard at all  Food Insecurity: No Food Insecurity   Worried About [place] in the Last Year: Never true   Ran Out of Food in the Last Year: Never true  Transportation Needs: No Transportation Needs   Lack of Transportation (Medical): No   Lack of Transportation (Non-Medical): No  Physical Activity: Inactive   Days of Exercise per Week: 0 days   Minutes of Exercise per Session: 0 min  Stress: No Stress Concern Present   Feeling of Stress : Not at all  Social Connections:    Frequency of Communication with Friends and Family: Not on file   Frequency of Social Gatherings with Friends and Family: Not on file   Attends Religious Services: Not on file   Active Member of Clubs  or Organizations: Not on file   Attends Archivist Meetings: Not on file   Marital Status: Not on file  Intimate Partner Violence: Not At Risk   Fear of Current or Ex-Partner: No   Emotionally Abused: No   Physically Abused: No   Sexually Abused: No    Past Surgical History:  Procedure Laterality Date   24 HOUR [place] STUDY N/A 12/29/2015   Procedure: 24 HOUR [place] STUDY;  Surgeon: Manus Gunning, MD;  [facility]: [facility];  Service: Gastroenterology;  Laterality: N/A;   CARPAL TUNNEL RELEASE Left 07/02/2015   Procedure: CARPAL TUNNEL RELEASE;  Surgeon: Frederik Pear, MD;  Location: [facility];  Service: Orthopedics;  Laterality: Left;   CATARACT EXTRACTION, BILATERAL Bilateral    Oct and Nov 2017   CORONARY ANGIOPLASTY WITH STENT PLACEMENT  06/2011   "1"   CORONARY ARTERY BYPASS GRAFT  2005   CABG X 2   DILATION AND CURETTAGE OF UTERUS     "more than once"   ELBOW ARTHROSCOPY Left 07/02/2015   Procedure: ARTHROSCOPY LEFT ELBOW WITH DEBRIDEMENT AND REMOVAL LOOSE BODY;  Surgeon: Frederik Pear, MD;  Location: [facility];  Service: Orthopedics;  Laterality: Left;   ESOPHAGEAL DILATION  01/21/2020   Procedure: ESOPHAGEAL DILATION;  Surgeon: Lavena Bullion, DO;  [facility];  Service: Gastroenterology;;   ESOPHAGEAL MANOMETRY N/A 12/29/2015   Procedure: ESOPHAGEAL MANOMETRY (EM);  Surgeon: Manus Gunning, MD;  [facility];  Service: Gastroenterology;  Laterality: N/A;   ESOPHAGEAL MANOMETRY N/A 07/30/2019   Procedure: ESOPHAGEAL MANOMETRY (EM);  Surgeon: Yetta Flock, MD;  [facility];  Service: Gastroenterology;  Laterality: N/A;   ESOPHAGOGASTRODUODENOSCOPY (EGD) WITH PROPOFOL N/A 01/21/2020   Procedure: ESOPHAGOGASTRODUODENOSCOPY (EGD) WITH PROPOFOL;  Surgeon: Lavena Bullion, DO;  [facility];  Service: Gastroenterology;  Laterality: N/A;   FRACTURE SURGERY  ~ 2005    nose   KNEE ARTHROSCOPY  09/04/2012   Procedure: ARTHROSCOPY KNEE;  Surgeon: Gearlean Alf, MD;  [facility];  Service: Orthopedics;  Laterality: Right;  right knee arthroscopy with medial and lateral meniscus debridement   MELANOMA EXCISION Right 10/13/2016   right side of neck   MOUTH SURGERY  2004   "bone replacement; had cadavear bones put in; face was collapsing"   NASAL SEPTUM SURGERY  ~ [place] Right 07/07/2013   Procedure: RIGHT TOTAL KNEE ARTHROPLASTY;  Surgeon: Gearlean Alf, MD;  [facility];  Service: Orthopedics;  Laterality: Right;   TRANSORAL INCISIONLESS FUNDOPLICATION N/A 06/30/2992   Procedure: TRANSORAL INCISIONLESS FUNDOPLICATION;  Surgeon: Lavena Bullion, DO;  [facility]:  [facility];  Service: Gastroenterology;  Laterality: N/A;    Family History  Problem Relation Age of Onset   Breast cancer Mother 21   Heart disease Father 47       sudden onset due to CAD   Hypertension Sister    Dementia Sister    Diabetes Sister    Cancer - Colon Sister 83   GER disease Daughter    Hypertension Daughter    Heart disease Brother    Hypertension Brother    Heart attack Neg Hx    Stroke Neg Hx     Allergies  Allergen Reactions   Hydrocodone-Acetaminophen Other (See Comments)    "Changed personality" "made me very mean"   Percocet [Oxycodone-Acetaminophen] Other (See Comments)    hallucination   Sulfa Antibiotics Other (See Comments)    hallucinations    Current Outpatient Medications on File Prior to Visit  Medication Sig Dispense Refill   aspirin EC 81 MG tablet Take 1 tablet (81 mg total) by mouth daily.     atorvastatin (LIPITOR) 80 MG tablet TAKE 1 TABLET BY MOUTH EVERY DAY 90 tablet 3   BLINK TEARS 0.25 % SOLN Place 1 drop into both eyes 3 (three) times daily as needed (dry/irritated eyes.).     calcium-vitamin D (OSCAL WITH D) 500-200 MG-UNIT tablet Take 1 tablet by mouth 2 (two) times daily.       CARAFATE 1 GM/10ML suspension TAKE 10 MLS (1 G TOTAL) BY MOUTH EVERY 6 (SIX) HOURS AS NEEDED. 420 mL 3   cetirizine (ZYRTEC) 10 MG tablet Take 10 mg by mouth daily.      chlorthalidone (HYGROTON) 25 MG tablet TAKE 1 TABLET BY MOUTH EVERY DAY 90 tablet 3   CINNAMON PO Take 1,000 mg by mouth in the morning and at bedtime.     clonazePAM (KLONOPIN) 1 MG tablet TAKE 2 TABLETS BY MOUTH AT BEDTIME 60 tablet 0   gabapentin (NEURONTIN) 300 MG capsule TAKE 2 CAPSULES (600 MG TOTAL) BY MOUTH AT BEDTIME. 180 capsule 1   Insulin Pen Needle (NOVOFINE) 32G X 6 MM MISC USE TO INJECT VICTOZA DAILY. DX: E11.9 100 each 3   Lancet Device MISC Use to check blood sugar twice daily. 1 each 0   Lancets (FREESTYLE) lancets Use to check blood sugar twice daily 100 each 11   metFORMIN (GLUCOPHAGE-XR) 500 MG 24 hr tablet TAKE 2 TABLETS BY MOUTH TWICE A DAY 360 tablet 1   metoprolol succinate (TOPROL-XL) 100 MG 24 hr tablet TAKE 1 TABLET (100 MG TOTAL) BY MOUTH DAILY. TAKE WITH OR IMMEDIATELY FOLLOWING A MEAL. 90 tablet 3   Multiple Vitamins-Minerals (PRESERVISION AREDS PO) Take 1 tablet by mouth 2 (two) times daily. Reported on 12/23/2015     MYRBETRIQ 25 MG TB24 tablet TAKE 1 TABLET BY MOUTH EVERY DAY 90 tablet 3   nitroGLYCERIN (NITROSTAT) 0.4 MG SL tablet PLACE 1 TABLET (0.4 MG TOTAL) UNDER THE TONGUE EVERY 5 (FIVE) MINUTES AS NEEDED FOR CHEST PAIN 25 tablet 0   ONETOUCH ULTRA test strip USE TO CHECK BLOOD SUGAR TWICE DAILY 150 strip 3   simethicone (MYLICON) 40 FM/3.8GY drops Take 1.2 mLs (80 mg total) by mouth 4 (four) times daily as needed for flatulence. 30 mL 0   venlafaxine XR (EFFEXOR-XR) 75 MG 24 hr capsule TAKE 1 CAPSULE (75 MG TOTAL) BY MOUTH DAILY WITH BREAKFAST. 90 capsule 1   VICTOZA 18 MG/3ML SOPN INJECT 1.8 MG UNDER THE SKIN ONCE DAILY 27 pen 3  No current facility-administered medications on file prior to visit.    BP 116/78    Pulse 78    Temp (!) 96.6 F (35.9 C) (Temporal)    Ht  5' 2.5" (1.588 m)    Wt 147 lb 8 oz (66.9 kg)    SpO2 98%    BMI 26.55 kg/m    Objective:   Physical Exam Constitutional:      Appearance: She is not ill-appearing.  Cardiovascular:     Heart sounds: Normal heart sounds.  Pulmonary:     Effort: Pulmonary effort is normal.  Abdominal:     General: Abdomen is flat.     Palpations: Abdomen is soft.     Tenderness: There is abdominal tenderness in the suprapubic area. There is no right CVA tenderness or left CVA tenderness.  Neurological:     Mental Status: She is alert and oriented to person, place, and time.            Assessment & Plan:

## 2020-07-08 NOTE — Assessment & Plan Note (Addendum)
Acute symptoms for the last 2 weeks. Today she doesn't appear sickly or disoriented.  UA today with 3+ leuks, positive nitrites, 3+ blood. Culture sent.  Given UA result, coupled with symptoms we will treat. Rx for Macrobid course sent to pharmacy.   Again, she appears very stable, don't suspect acute pyelonephritis at this point. Strongly advised she increase daily water intake. Return precautions provided.

## 2020-07-10 LAB — URINE CULTURE
MICRO NUMBER:: 10876383
SPECIMEN QUALITY:: ADEQUATE

## 2020-07-12 ENCOUNTER — Encounter: Payer: Self-pay | Admitting: Gastroenterology

## 2020-07-14 MED ORDER — CEPHALEXIN 500 MG PO CAPS: 500.0000 mg | ORAL_CAPSULE | Freq: Three times a day (TID) | ORAL | 0 refills | Status: DC

## 2020-07-29 ENCOUNTER — Other Ambulatory Visit: Payer: Self-pay | Admitting: Family Medicine

## 2020-07-29 MED ORDER — TRIAMCINOLONE ACETONIDE 0.1 % EX CREA
1.0000 | TOPICAL_CREAM | Freq: Two times a day (BID) | CUTANEOUS | 0 refills | Status: DC
Start: 2020-07-29 — End: 2022-02-07

## 2020-07-29 NOTE — Telephone Encounter (Signed)
Pt left a message on triage line asking for something, also.

## 2020-07-30 ENCOUNTER — Other Ambulatory Visit: Payer: Self-pay | Admitting: Family Medicine

## 2020-07-30 NOTE — Telephone Encounter (Signed)
Last office visit 08/26/201 with Gentry Fitz for urinary frequency.  Last refilled 05/03/2020 for #60 with no refills.  Next Appt 08/10/2020 for 6 month follow up.

## 2020-08-04 ENCOUNTER — Telehealth: Payer: Self-pay | Admitting: Family Medicine

## 2020-08-04 DIAGNOSIS — Z1159 Encounter for screening for other viral diseases: Secondary | ICD-10-CM

## 2020-08-04 DIAGNOSIS — E1159 Type 2 diabetes mellitus with other circulatory complications: Secondary | ICD-10-CM

## 2020-08-04 NOTE — Telephone Encounter (Signed)
-----   Message from Cloyd Stagers, RT sent at 07/22/2020  2:13 PM EDT ----- Regarding: Lab Orders for Thursday 9.23.2021 Please place lab orders for Thursday 9.23.2021, office visit for 6 month f/u on Tuesday 9.28.2021 Thank you, Dyke Maes RT(R)

## 2020-08-05 ENCOUNTER — Other Ambulatory Visit: Payer: Self-pay

## 2020-08-05 ENCOUNTER — Other Ambulatory Visit (INDEPENDENT_AMBULATORY_CARE_PROVIDER_SITE_OTHER): Payer: Medicare PPO

## 2020-08-05 DIAGNOSIS — E1159 Type 2 diabetes mellitus with other circulatory complications: Secondary | ICD-10-CM | POA: Diagnosis not present

## 2020-08-05 DIAGNOSIS — Z1159 Encounter for screening for other viral diseases: Secondary | ICD-10-CM | POA: Diagnosis not present

## 2020-08-05 LAB — COMPREHENSIVE METABOLIC PANEL
ALT: 20 U/L (ref 0–35)
AST: 20 U/L (ref 0–37)
Albumin: 4.1 g/dL (ref 3.5–5.2)
Alkaline Phosphatase: 57 U/L (ref 39–117)
BUN: 30 mg/dL — ABNORMAL HIGH (ref 6–23)
CO2: 34 mEq/L — ABNORMAL HIGH (ref 19–32)
Calcium: 10.6 mg/dL — ABNORMAL HIGH (ref 8.4–10.5)
Chloride: 100 mEq/L (ref 96–112)
Creatinine, Ser: 1.02 mg/dL (ref 0.40–1.20)
GFR: 52.36 mL/min — ABNORMAL LOW (ref >60.00)
Glucose, Bld: 90 mg/dL (ref 70–99)
Potassium: 3.8 mEq/L (ref 3.5–5.1)
Sodium: 141 mEq/L (ref 135–145)
Total Bilirubin: 0.4 mg/dL (ref 0.2–1.2)
Total Protein: 6.5 g/dL (ref 6.0–8.3)

## 2020-08-05 LAB — LIPID PANEL
Cholesterol: 113 mg/dL (ref 0–200)
HDL: 53.6 mg/dL (ref >39.00)
LDL Cholesterol: 47 mg/dL (ref 0–99)
NonHDL: 59.83
Total CHOL/HDL Ratio: 2
Triglycerides: 65 mg/dL (ref 0.0–149.0)
VLDL: 13 mg/dL (ref 0.0–40.0)

## 2020-08-05 LAB — HEMOGLOBIN A1C: Hgb A1c MFr Bld: 6.7 % — ABNORMAL HIGH (ref 4.6–6.5)

## 2020-08-05 NOTE — Progress Notes (Signed)
No critical labs need to be addressed urgently. We will discuss labs in detail at upcoming office visit.   

## 2020-08-06 LAB — HEPATITIS C ANTIBODY
Hepatitis C Ab: NONREACTIVE
SIGNAL TO CUT-OFF: 0.01 (ref <1.00)

## 2020-08-06 NOTE — Progress Notes (Signed)
No critical labs need to be addressed urgently. We will discuss labs in detail at upcoming office visit.   

## 2020-08-09 ENCOUNTER — Other Ambulatory Visit: Payer: Self-pay | Admitting: Family Medicine

## 2020-08-10 ENCOUNTER — Other Ambulatory Visit: Payer: Self-pay

## 2020-08-10 ENCOUNTER — Encounter: Payer: Self-pay | Admitting: Family Medicine

## 2020-08-10 ENCOUNTER — Ambulatory Visit: Payer: Medicare PPO | Admitting: Family Medicine

## 2020-08-10 VITALS — BP 120/60 | HR 63 | Temp 98.3°F | Ht 61.75 in | Wt 150.3 lb

## 2020-08-10 DIAGNOSIS — E1169 Type 2 diabetes mellitus with other specified complication: Secondary | ICD-10-CM

## 2020-08-10 DIAGNOSIS — I10 Essential (primary) hypertension: Secondary | ICD-10-CM | POA: Diagnosis not present

## 2020-08-10 DIAGNOSIS — E1159 Type 2 diabetes mellitus with other circulatory complications: Secondary | ICD-10-CM | POA: Diagnosis not present

## 2020-08-10 DIAGNOSIS — T63421A Toxic effect of venom of ants, accidental (unintentional), initial encounter: Secondary | ICD-10-CM

## 2020-08-10 DIAGNOSIS — I480 Paroxysmal atrial fibrillation: Secondary | ICD-10-CM | POA: Diagnosis not present

## 2020-08-10 DIAGNOSIS — E785 Hyperlipidemia, unspecified: Secondary | ICD-10-CM | POA: Diagnosis not present

## 2020-08-10 MED ORDER — NITROGLYCERIN 0.4 MG SL SUBL: SUBLINGUAL_TABLET | SUBLINGUAL | 0 refills | Status: DC

## 2020-08-10 NOTE — Progress Notes (Signed)
Chief Complaint  Patient presents with  . Diabetes    History of Present Illness: HPI   Hypertension:   Well controlled on metoprolol. Using medication without problems or lightheadedness: none Chest pain with exertion: none Edema:none Short of breath:none Average home BPs: Other issues:   Diabetes:   On metformin and Victoza Lab Results  Component Value Date   HGBA1C 6.7 (H) 08/05/2020  Using medications without difficulties: none Hypoglycemic episodes: none Hyperglycemic episodes: none Feet problems: she stepped into fire ant next.. bites on feet 2 weeks ago.. improving. Applying triamcinolone  BID. Blood Sugars averaging: FBS 114 eye exam within last year: due   Elevated Cholesterol:  At goal on statin Lab Results  Component Value Date   CHOL 113 08/05/2020   HDL 53.60 08/05/2020   LDLCALC 47 08/05/2020   TRIG 65.0 08/05/2020   CHOLHDL 2 08/05/2020   Using medications without problems: Muscle aches:  Diet compliance: good Exercise: walking Other complaints:   This visit occurred during the SARS-CoV-2 public health emergency.  Safety protocols were in place, including screening questions prior to the visit, additional usage of staff PPE, and extensive cleaning of exam room while observing appropriate contact time as indicated for disinfecting solutions.   COVID 19 screen:  No recent travel or known exposure to COVID19 The patient denies respiratory symptoms of COVID 19 at this time. The importance of social distancing was discussed today.     Review of Systems  Constitutional: Negative for chills and fever.  HENT: Negative for congestion and ear pain.   Eyes: Negative for pain and redness.  Respiratory: Negative for cough and shortness of breath.   Cardiovascular: Negative for chest pain, palpitations and leg swelling.  Gastrointestinal: Negative for abdominal pain, blood in stool, constipation, diarrhea, nausea and vomiting.  Genitourinary: Negative  for dysuria.  Musculoskeletal: Negative for falls and myalgias.  Skin: Negative for rash.  Neurological: Negative for dizziness.  Psychiatric/Behavioral: Negative for depression. The patient is not nervous/anxious.       Past Medical History:  Diagnosis Date  . Angina   . Arthritis   . Atrial fibrillation ([facility])    ASPIRIN FOR BLOOD THINNER  . Atypical mole 09/22/2016  . Bulging discs    lumbar   . Complication of anesthesia    pt states has choking sensation with ET tube   . Coronary artery disease   . Depression   . Diabetes mellitus    diet controlled/on meds  . Family history of breast cancer   . Family history of colon cancer   . Fibromyalgia   . GERD (gastroesophageal reflux disease)   . H/O hiatal hernia   . Headache(784.0)    "recurring"  . High cholesterol   . History of colon polyps   . Hyperlipidemia   . Hypertension   . 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 Melanoma ([place])    melanoma  . Migraines    "til ~ 1980"  . PONV (postoperative nausea and vomiting)   . Restless leg syndrome   . Sciatic nerve pain    "from pinched nerve"  . Sleep apnea    uses CPAP  . Stroke [facility]) 2014   no deficits  . Weakness of right side of body    "I've had PT for it; they don't know what it's from".  CORTISONE INJECTION INTO BACK 08/30/12    reports that she has never smoked. She has never used smokeless tobacco. She reports current alcohol use of about  1.0 standard drink of alcohol per week. She reports that she does not use drugs.   Current Outpatient Medications:  .  aspirin EC 81 MG tablet, Take 1 tablet (81 mg total) by mouth daily., Disp: , Rfl:  .  atorvastatin (LIPITOR) 80 MG tablet, TAKE 1 TABLET BY MOUTH EVERY DAY, Disp: 90 tablet, Rfl: 3 .  BLINK TEARS 0.25 % SOLN, Place 1 drop into both eyes 3 (three) times daily as needed (dry/irritated eyes.)., Disp: , Rfl:  .  calcium-vitamin D (OSCAL WITH D) 500-200 MG-UNIT tablet, Take 1 tablet by mouth 2 (two) times daily., Disp: ,  Rfl:  .  CARAFATE 1 GM/10ML suspension, TAKE 10 MLS (1 G TOTAL) BY MOUTH EVERY 6 (SIX) HOURS AS NEEDED., Disp: 420 mL, Rfl: 3 .  cephALEXin (KEFLEX) 500 MG capsule, Take 1 capsule (500 mg total) by mouth 3 (three) times daily., Disp: 21 capsule, Rfl: 0 .  cetirizine (ZYRTEC) 10 MG tablet, Take 10 mg by mouth daily. , Disp: , Rfl:  .  chlorthalidone (HYGROTON) 25 MG tablet, TAKE 1 TABLET BY MOUTH EVERY DAY, Disp: 90 tablet, Rfl: 3 .  CINNAMON PO, Take 1,000 mg by mouth in the morning and at bedtime., Disp: , Rfl:  .  clonazePAM (KLONOPIN) 1 MG tablet, TAKE 2 TABLETS BY MOUTH AT BEDTIME, Disp: 60 tablet, Rfl: 0 .  gabapentin (NEURONTIN) 300 MG capsule, TAKE 2 CAPSULES (600 MG TOTAL) BY MOUTH AT BEDTIME., Disp: 180 capsule, Rfl: 1 .  Insulin Pen Needle (NOVOFINE) 32G X 6 MM MISC, USE TO INJECT VICTOZA DAILY. DX: E11.9, Disp: 100 each, Rfl: 3 .  Lancet Device MISC, Use to check blood sugar twice daily., Disp: 1 each, Rfl: 0 .  Lancets (FREESTYLE) lancets, Use to check blood sugar twice daily, Disp: 100 each, Rfl: 11 .  metFORMIN (GLUCOPHAGE-XR) 500 MG 24 hr tablet, TAKE 2 TABLETS BY MOUTH TWICE A DAY, Disp: 360 tablet, Rfl: 1 .  metoprolol succinate (TOPROL-XL) 100 MG 24 hr tablet, TAKE 1 TABLET (100 MG TOTAL) BY MOUTH DAILY. TAKE WITH OR IMMEDIATELY FOLLOWING A MEAL., Disp: 90 tablet, Rfl: 3 .  Multiple Vitamins-Minerals (PRESERVISION AREDS PO), Take 1 tablet by mouth 2 (two) times daily. Reported on 12/23/2015, Disp: , Rfl:  .  MYRBETRIQ 25 MG TB24 tablet, TAKE 1 TABLET BY MOUTH EVERY DAY, Disp: 90 tablet, Rfl: 3 .  nitroGLYCERIN (NITROSTAT) 0.4 MG SL tablet, PLACE 1 TABLET (0.4 MG TOTAL) UNDER THE TONGUE EVERY 5 (FIVE) MINUTES AS NEEDED FOR CHEST PAIN, Disp: 25 tablet, Rfl: 0 .  ONETOUCH ULTRA test strip, USE TO CHECK BLOOD SUGAR TWICE DAILY, Disp: 150 strip, Rfl: 3 .  simethicone (MYLICON) 40 AO/1.3YQ drops, Take 1.2 mLs (80 mg total) by mouth 4 (four) times daily as needed for flatulence., Disp: 30  mL, Rfl: 0 .  triamcinolone cream (KENALOG) 0.1 %, Apply 1 application topically 2 (two) times daily., Disp: 30 g, Rfl: 0 .  venlafaxine XR (EFFEXOR-XR) 75 MG 24 hr capsule, TAKE 1 CAPSULE (75 MG TOTAL) BY MOUTH DAILY WITH BREAKFAST., Disp: 90 capsule, Rfl: 1 .  VICTOZA 18 MG/3ML SOPN, INJECT 1.8 MG UNDER THE SKIN ONCE DAILY, Disp: 27 pen, Rfl: 3   Observations/Objective: Blood pressure 120/60, pulse 63, temperature 98.3 F (36.8 C), temperature source Tempor 41 yea rold female presetns for DM follow up. al, height 5' 1.75" (1.568 m), weight 150 lb 5 oz (68.2 kg), SpO2 96 %.  Physical Exam Constitutional:  General: She is not in acute distress.    Appearance: Normal appearance. She is well-developed. She is not ill-appearing or toxic-appearing.  HENT:     Head: Normocephalic.     Right Ear: Hearing, tympanic membrane, ear canal and external ear normal. Tympanic membrane is not erythematous, retracted or bulging.     Left Ear: Hearing, tympanic membrane, ear canal and external ear normal. Tympanic membrane is not erythematous, retracted or bulging.     Nose: No mucosal edema or rhinorrhea.     Right Sinus: No maxillary sinus tenderness or frontal sinus tenderness.     Left Sinus: No maxillary sinus tenderness or frontal sinus tenderness.     Mouth/Throat:     Pharynx: Uvula midline.  Eyes:     General: Lids are normal. Lids are everted, no foreign bodies appreciated.     Conjunctiva/sclera: Conjunctivae normal.     Pupils: Pupils are equal, round, and reactive to light.  Neck:     Thyroid: No thyroid mass or thyromegaly.     Vascular: No carotid bruit.     Trachea: Trachea normal.  Cardiovascular:     Rate and Rhythm: Normal rate and regular rhythm.     Pulses: Normal pulses.     Heart sounds: Normal heart sounds, S1 normal and S2 normal. No murmur heard.  No friction rub. No gallop.   Pulmonary:     Effort: Pulmonary effort is normal. No tachypnea or respiratory distress.      Breath sounds: Normal breath sounds. No decreased breath sounds, wheezing, rhonchi or rales.  Abdominal:     General: Bowel sounds are normal.     Palpations: Abdomen is soft.     Tenderness: There is no abdominal tenderness.  Musculoskeletal:     Cervical back: Normal range of motion and neck supple.  Skin:    General: Skin is warm and dry.     Findings: No rash.  Neurological:     Mental Status: She is alert.  Psychiatric:        Mood and Affect: Mood is not anxious or depressed.        Speech: Speech normal.        Behavior: Behavior normal. Behavior is cooperative.        Thought Content: Thought content normal.        Judgment: Judgment normal.     Diabetic foot exam: Normal inspection except for healing bites on toes from ant No skin breakdown No calluses  Normal DP pulses Normal sensation to light touch and monofilament Nails normal  Assessment and Plan     Type 2 diabetes mellitus with vascular disease ([place]) Good control on metformin and victoza.  Hypertension associated with diabetes ([place]) Well controlled. Continue current medication.   Hyperlipidemia associated with type 2 diabetes mellitus ([place]) At goal on statin.  Paroxysmal A-fib ([facility])  Stable control.  Fire ant bite Continue triamcinolone 0.1 % 2 times daily.. call if toe rash from ants not improving.    Eliezer Lofts, MD

## 2020-08-10 NOTE — Patient Instructions (Addendum)
Continue triamcinolone 0.1 % 2 times daily.. call if toe rash from ants not improving. Set up yearly eye exam for diabetes and have the opthalmologist send [place] a copy of the evaluation for the chart. Increase water intake some.  Decrease calcium intake to once daily.

## 2020-08-16 LAB — HM DIABETES FOOT EXAM

## 2020-08-19 DIAGNOSIS — T63421A Toxic effect of venom of ants, accidental (unintentional), initial encounter: Secondary | ICD-10-CM | POA: Insufficient documentation

## 2020-08-19 NOTE — Assessment & Plan Note (Signed)
Stable control. 

## 2020-08-19 NOTE — Assessment & Plan Note (Signed)
Continue triamcinolone 0.1 % 2 times daily.. call if toe rash from ants not improving.

## 2020-08-19 NOTE — Assessment & Plan Note (Signed)
Good control on metformin and victoza.

## 2020-08-19 NOTE — Assessment & Plan Note (Signed)
Well controlled. Continue current medication.  

## 2020-08-19 NOTE — Assessment & Plan Note (Signed)
At goal on statin 

## 2020-08-26 ENCOUNTER — Other Ambulatory Visit: Payer: Self-pay

## 2020-08-26 ENCOUNTER — Ambulatory Visit (AMBULATORY_SURGERY_CENTER): Payer: Self-pay

## 2020-08-26 ENCOUNTER — Telehealth: Payer: Self-pay

## 2020-08-26 ENCOUNTER — Encounter: Payer: Self-pay | Admitting: Gastroenterology

## 2020-08-26 VITALS — Ht 61.75 in | Wt 151.0 lb

## 2020-08-26 DIAGNOSIS — Z800 Family history of malignant neoplasm of digestive organs: Secondary | ICD-10-CM

## 2020-08-26 DIAGNOSIS — Z86010 Personal history of colonic polyps: Secondary | ICD-10-CM

## 2020-08-26 MED ORDER — NA SULFATE-K SULFATE-MG SULF 17.5-3.13-1.6 GM/177ML PO SOLN: 1.0000 | Freq: Once | ORAL | 0 refills | Status: AC

## 2020-08-26 NOTE — Telephone Encounter (Signed)
Okay thank you. I can discuss further with her at the time of her procedure.

## 2020-08-26 NOTE — Telephone Encounter (Signed)
Ok great, thank you. 

## 2020-08-26 NOTE — Telephone Encounter (Signed)
Hi Dr Havery Moros,  This pt had a previsit today and she told me she has been having times of explosive diarrhea.  She is scheduled for her colonoscopy on Tuesday 10/19.  She said it is at least weekly but sometimes more often for the past few weeks.  I told her I would  let you know before the procedure, in case this changes your plan but I did not add the diagnosis to her visit, so let me know if I should add the dx,  thank you.  Di Kindle

## 2020-08-26 NOTE — Progress Notes (Signed)
No egg or soy allergy known to patient  No issues with past sedation with any surgeries or procedures no intubation problems in the past  No FH of Malignant Hyperthermia No diet pills per patient No home 02 use per patient  No blood thinners per patient  Pt denies issues with constipation  No A fib or A flutter  EMMI video to pt or via [place] 19 guidelines implemented in PV today with Pt and RN   Pt c/o "violent diarrhea recently that comes on suddenly at least once weekly", sent note to Dr Havery Moros, pt instructed to take 1 capful of miralax daily starting today due to last prep only "adequate"  Suprep  Code to Pharmacy   Due to the COVID-19 pandemic we are asking patients to follow these guidelines. Please only bring one care partner. Please be aware that your care partner may wait in the car in the parking lot or if they feel like they will be too hot to wait in the car, they may wait in the lobby on the 4th floor. All care partners are required to wear a mask the entire time (we do not have any that we can provide them), they need to practice social distancing, and we will do a Covid check for all patient's and care partners when you arrive. Also we will check their temperature and your temperature. If the care partner waits in their car they need to stay in the parking lot the entire time and we will call them on their cell phone when the patient is ready for discharge so they can bring the car to the front of the building. Also all patient's will need to wear a mask into building.

## 2020-08-31 ENCOUNTER — Other Ambulatory Visit: Payer: Self-pay

## 2020-08-31 ENCOUNTER — Encounter: Payer: Self-pay | Admitting: Gastroenterology

## 2020-08-31 ENCOUNTER — Ambulatory Visit (AMBULATORY_SURGERY_CENTER): Payer: Medicare PPO | Admitting: Gastroenterology

## 2020-08-31 VITALS — BP 140/73 | HR 55 | Temp 97.2°F | Resp 22 | Ht 61.0 in | Wt 151.0 lb

## 2020-08-31 DIAGNOSIS — D125 Benign neoplasm of sigmoid colon: Secondary | ICD-10-CM

## 2020-08-31 DIAGNOSIS — Z86010 Personal history of colonic polyps: Secondary | ICD-10-CM

## 2020-08-31 DIAGNOSIS — Z800 Family history of malignant neoplasm of digestive organs: Secondary | ICD-10-CM

## 2020-08-31 DIAGNOSIS — R195 Other fecal abnormalities: Secondary | ICD-10-CM

## 2020-08-31 DIAGNOSIS — K5140 Inflammatory polyps of colon without complications: Secondary | ICD-10-CM

## 2020-08-31 DIAGNOSIS — G4733 Obstructive sleep apnea (adult) (pediatric): Secondary | ICD-10-CM | POA: Diagnosis not present

## 2020-08-31 DIAGNOSIS — R197 Diarrhea, unspecified: Secondary | ICD-10-CM

## 2020-08-31 DIAGNOSIS — I4891 Unspecified atrial fibrillation: Secondary | ICD-10-CM | POA: Diagnosis not present

## 2020-08-31 DIAGNOSIS — K5730 Diverticulosis of large intestine without perforation or abscess without bleeding: Secondary | ICD-10-CM

## 2020-08-31 DIAGNOSIS — D123 Benign neoplasm of transverse colon: Secondary | ICD-10-CM

## 2020-08-31 MED ORDER — SODIUM CHLORIDE 0.9 % IV SOLN: 500.0000 mL | Freq: Once | INTRAVENOUS | Status: DC

## 2020-08-31 NOTE — Progress Notes (Signed)
No problems noted in the recovery room. maw 

## 2020-08-31 NOTE — Progress Notes (Signed)
Vital signs checked by:CW  The patient states no changes in medical or surgical history since pre-visit screening on 08/26/20.

## 2020-08-31 NOTE — Patient Instructions (Addendum)
Handouts were given to you on polyps and diverticulosis. Your blood sugar was 102 in the recovery room. You may resume your current medications today. Await biopsy results.  Usually takes 2-3 weeks to receive pathology results. Please call if any questions or concerns.     YOU HAD AN ENDOSCOPIC PROCEDURE TODAY AT [facility]:   Refer to the procedure report that was given to you for any specific questions about what was found during the examination.  If the procedure report does not answer your questions, please call your gastroenterologist to clarify.  If you requested that your care partner not be given the details of your procedure findings, then the procedure report has been included in a sealed envelope for you to review at your convenience later.  YOU SHOULD EXPECT: Some feelings of bloating in the abdomen. Passage of more gas than usual.  Walking can help get rid of the air that was put into your GI tract during the procedure and reduce the bloating. If you had a lower endoscopy (such as a colonoscopy or flexible sigmoidoscopy) you may notice spotting of blood in your stool or on the toilet paper. If you underwent a bowel prep for your procedure, you may not have a normal bowel movement for a few days.  Please Note:  You might notice some irritation and congestion in your nose or some drainage.  This is from the oxygen used during your procedure.  There is no need for concern and it should clear up in a day or so.  SYMPTOMS TO REPORT IMMEDIATELY:   Following lower endoscopy (colonoscopy or flexible sigmoidoscopy):  Excessive amounts of blood in the stool  Significant tenderness or worsening of abdominal pains  Swelling of the abdomen that is new, acute  Fever of 100F or higher    For urgent or emergent issues, a gastroenterologist can be reached at any hour by calling [phone]. Do not use MyChart messaging for urgent concerns.    DIET:  We do recommend a  small meal at first, but then you may proceed to your regular diet.  Drink plenty of fluids but you should avoid alcoholic beverages for 24 hours.  ACTIVITY:  You should plan to take it easy for the rest of today and you should NOT DRIVE or use heavy machinery until tomorrow (because of the sedation medicines used during the test).    FOLLOW UP: Our staff will call the number listed on your records 48-72 hours following your procedure to check on you and address any questions or concerns that you may have regarding the information given to you following your procedure. If we do not reach you, we will leave a message.  We will attempt to reach you two times.  During this call, we will ask if you have developed any symptoms of COVID 19. If you develop any symptoms (ie: fever, flu-like symptoms, shortness of breath, cough etc.) before then, please call [phone].  If you test positive for Covid 19 in the 2 weeks post procedure, please call and report this information to [place].    If any biopsies were taken you will be contacted by phone or by letter within the next 1-3 weeks.  Please call us at [phone] if you have not heard about the biopsies in 3 weeks.    SIGNATURES/CONFIDENTIALITY: You and/or your care partner have signed paperwork which will be entered into your electronic medical record.  These signatures attest to the fact that  that the information above on your After Visit Summary has been reviewed and is understood.  Full responsibility of the confidentiality of this discharge information lies with you and/or your care-partner.

## 2020-08-31 NOTE — Progress Notes (Signed)
Called to room to assist during endoscopic procedure.  Patient ID and intended procedure confirmed with present staff. Received instructions for my participation in the procedure from the performing physician.  

## 2020-08-31 NOTE — Op Note (Signed)
[facility] Patient Name: Morgan [name] Procedure Date: 08/31/2020 9:02 AM MRN: [id number] Endoscopist: Remo Lipps P. Havery Moros , MD Age: 79 Referring MD:  Date of Birth: [birth year] Gender: Female Account #: [id number] Procedure:                Colonoscopy Indications:              High risk colon cancer surveillance: Personal                            history of colonic polyps (14 polyps removed one                            year ago), sister with colon cancer dx age 13,                            negative genetic testing, also with worsening of                            chronic diarrhea recently Medicines:                Monitored Anesthesia Care Procedure:                Pre-Anesthesia Assessment:                           - Prior to the procedure, a History and Physical                            was performed, and patient medications and                            allergies were reviewed. The patient's tolerance of                            previous anesthesia was also reviewed. The risks                            and benefits of the procedure and the sedation                            options and risks were discussed with the patient.                            All questions were answered, and informed consent                            was obtained. Prior Anticoagulants: The patient has                            taken no previous anticoagulant or antiplatelet                            agents. ASA Grade Assessment: III - A patient with  severe systemic disease. After reviewing the risks                            and benefits, the patient was deemed in                            satisfactory condition to undergo the procedure.                           After obtaining informed consent, the colonoscope                            was passed under direct vision. Throughout the                            procedure, the patient's blood  pressure, pulse, and                            oxygen saturations were monitored continuously. The                            Colonoscope was introduced through the anus and                            advanced to the the cecum, identified by                            appendiceal orifice and ileocecal valve. The                            colonoscopy was performed without difficulty. The                            patient tolerated the procedure well. The quality                            of the bowel preparation was adequate. The                            ileocecal valve, appendiceal orifice, and rectum                            were photographed. Scope In: 9:04:22 AM Scope Out: 9:33:19 AM Scope Withdrawal Time: 0 hours 22 minutes 1 second  Total Procedure Duration: 0 hours 28 minutes 57 seconds  Findings:                 The perianal and digital rectal examinations were                            normal.                           Multiple small-mouthed diverticula were found in  the entire colon.                           Five sessile polyps were found in the transverse                            colon. The polyps were 2 to 3 mm in size. These                            polyps were removed with a cold biopsy forceps.                            Resection and retrieval were complete.                           A 3 mm polyp was found in the sigmoid colon. The                            polyp was sessile. The polyp was removed with a                            cold biopsy forceps. Resection and retrieval were                            complete.                           There was spasm in the entire colon.                           The exam was otherwise without abnormality. No                            overt inflammatory changes. Rectal vault was small,                            retroflexed views not obtained. Small hemorrhoids                             noted.                           Biopsies for histology were taken with a cold                            forceps from the right colon, left colon and                            transverse colon for evaluation of microscopic                            colitis. Complications:            No immediate complications. Estimated blood loss:  Minimal. Estimated Blood Loss:     Estimated blood loss was minimal. Impression:               - Diverticulosis in the entire examined colon.                           - Five 2 to 3 mm polyps in the transverse colon,                            removed with a cold biopsy forceps. Resected and                            retrieved.                           - One 3 mm polyp in the sigmoid colon, removed with                            a cold biopsy forceps. Resected and retrieved.                           - Colonic spasm.                           - The examination was otherwise normal.                           - Biopsies were taken with a cold forceps from the                            right colon, left colon and transverse colon for                            evaluation of microscopic colitis. Recommendation:           - Patient has a contact number available for                            emergencies. The signs and symptoms of potential                            delayed complications were discussed with the                            patient. Return to normal activities tomorrow.                            Written discharge instructions were provided to the                            patient.                           - Resume previous diet.                           -  Continue present medications.                           - Await pathology results. Remo Lipps P. Tidus Upchurch, MD 08/31/2020 9:38:13 AM This report has been signed electronically.

## 2020-08-31 NOTE — Progress Notes (Signed)
Report given to PACU, vss 

## 2020-09-02 ENCOUNTER — Telehealth: Payer: Self-pay | Admitting: *Deleted

## 2020-09-02 NOTE — Telephone Encounter (Signed)
  Follow up Call-  Call back number 08/31/2020 06/30/2019  Post procedure Call Back phone  # [phone]  Permission to leave phone message Yes Yes  Some recent data might be hidden     Patient questions:  Do you have a fever, pain , or abdominal swelling? No. Pain Score  0 *  Have you tolerated food without any problems? Yes.    Have you been able to return to your normal activities? Yes.    Do you have any questions about your discharge instructions: Diet   No. Medications  No. Follow up visit  No.  Do you have questions or concerns about your Care? No.  Actions: * If pain score is 4 or above: No action needed, pain <4.  1. Have you developed a fever since your procedure? no  2.   Have you had an respiratory symptoms (SOB or cough) since your procedure? no  3.   Have you tested positive for COVID 19 since your procedure no  4.   Have you had any family members/close contacts diagnosed with the COVID 19 since your procedure?  no   If yes to any of these questions please route to Joylene John, RN and Joella Prince, RN

## 2020-09-06 ENCOUNTER — Other Ambulatory Visit: Payer: Self-pay | Admitting: Family Medicine

## 2020-09-21 ENCOUNTER — Other Ambulatory Visit: Payer: Self-pay | Admitting: Family Medicine

## 2020-10-01 ENCOUNTER — Other Ambulatory Visit: Payer: Self-pay | Admitting: Family Medicine

## 2020-10-01 NOTE — Telephone Encounter (Signed)
Last office visit 08/10/2020 for DM.  Last refilled 08/09/2020 for #60 with no refills.  CPE 02/10/2021.

## 2020-10-13 ENCOUNTER — Encounter: Payer: Self-pay | Admitting: Gastroenterology

## 2020-10-13 ENCOUNTER — Ambulatory Visit (INDEPENDENT_AMBULATORY_CARE_PROVIDER_SITE_OTHER): Payer: Medicare PPO | Admitting: Gastroenterology

## 2020-10-13 VITALS — BP 166/76 | HR 60 | Ht 62.5 in | Wt 148.0 lb

## 2020-10-13 DIAGNOSIS — K219 Gastro-esophageal reflux disease without esophagitis: Secondary | ICD-10-CM

## 2020-10-13 DIAGNOSIS — R194 Change in bowel habit: Secondary | ICD-10-CM | POA: Diagnosis not present

## 2020-10-13 MED ORDER — POLYETHYLENE GLYCOL 3350 17 G PO PACK: PACK | ORAL | 0 refills | Status: DC

## 2020-10-13 NOTE — Progress Notes (Signed)
HPI :  79 year old female here for follow-up visit regarding GERD and bowel issues.  Recall that she has had a longstanding history of GERD and had refractory symptoms to multiple PPIs including Dexilant 60 mg a day.  pH test had confirmed pathologic reflux.  She ultimately underwent TIF procedure with Dr. Bryan Lemma in March of this year.  She states it went really well and had essentially no symptoms of reflux for a while.  She states just recently she had some mild indigestion which bothered her for a few days.  She did not really take anything at all for this.  Her symptoms have mostly resolved at this time and generally doing okay.  She denies abdominal pains.  She does have a history of osteopenia with history of bone fractures in the past.  Her main complaint at this time is altered bowel habits.  She states she has an episode every 1 to 2 weeks of explosive loose stool that bothers her without clear triggers.  On a more daily basis she endorses constipation and having hard stools and straining to get stool out.  When she has an episode of loose stools she will take a few Imodium and that will make constipation otherwise worse.  She will have a bowel movement on a daily basis but sense of incomplete evacuation.  She has tried Metamucil about 5-6 months ago for several weeks and it did not provide any benefit.  Her symptoms have been ongoing for about a year or so at least.  She has some superficial scant bleeding when she passes a hard stool.  She had a colonoscopy with me in October 2021 which showed no evidence of colitis, biopsies were negative for microscopic colitis.  She had some benign polyps removed from her colon, evidence of adenomas.  She does take Metformin and has been on this for longstanding time.  She has been on Victoza for the past few years.   Prior evaluation: -LastEGD:06/2019 -Barium esophagram:04/2018: Tiny HH, otherwise normal. Normal motility -Esophageal  Manometry:?Jackhammer esophagus with hypertensive LES, normal IRP, no achalasia in 2017. Repeat a.m. this month was normal -pH/Impedance:2017. DeMeester score 40 with majority of reflux episodes being acidic. SI 75%  Endoscopic history: -EGD (12/2015): Small HH, numerous fundic gland polyps -EGD (06/2019, Dr. Havery Moros): 2 cm HH, Hill Grade 2 valve, fundic gland polyps - EGD with TIF (01/21/2020, Dr. Bryan Lemma): 24 fasteners placed with 270 degree valve, 3 cm in length.  Colonoscopy 08/31/20 - High risk colon cancer surveillance: Personal history of colonic polyps (14 polyps removed one year ago), sister with colon cancer dx age 76, negative genetic testing, also with worsening of chronic diarrhea recently The perianal and digital rectal examinations were normal. - Multiple small-mouthed diverticula were found in the entire colon. - Five sessile polyps were found in the transverse colon. The polyps were 2 to 3 mm in size. These polyps were removed with a cold biopsy forceps. Resection and retrieval were complete. - A 3 mm polyp was found in the sigmoid colon. The polyp was sessile. The polyp was removed with a cold biopsy forceps. Resection and retrieval were complete. - There was spasm in the entire colon. - The exam was otherwise without abnormality. No overt inflammatory changes. Rectal vault was small, retroflexed views not obtained. Small hemorrhoids noted. - Biopsies for histology were taken with a cold forceps from the right colon, left colon and transverse colon for evaluation of microscopic colitis.  1. Surgical [P], colon, random sites -  COLONIC MUCOSA WITH NO SIGNIFICANT HISTOPATHOLOGIC CHANGES. - NO MICROSCOPIC COLITIS, ACTIVE INFLAMMATION OR GRANULOMAS. 2. Surgical [P], colon, transverse, sigmoid x 1, polyp (6) - INFLAMMATORY POLYP (THREE). - POLYPOID COLONIC MUCOSA (THREE). - NO ADENOMATOUS CHANGE OR CARCINOMA.   Past Medical History:  Diagnosis Date  .  Angina   . Arthritis   . Atrial fibrillation (HCC)    ASPIRIN FOR BLOOD THINNER  . Atypical mole 09/22/2016  . Bulging discs    lumbar   . Complication of anesthesia    pt states has choking sensation with ET tube   . Coronary artery disease   . Depression   . Diabetes mellitus    diet controlled/on meds  . Family history of breast cancer   . Family history of colon cancer   . Fibromyalgia   . GERD (gastroesophageal reflux disease)   . H/O hiatal hernia   . Headache(784.0)    "recurring"  . High cholesterol   . History of colon polyps   . Hyperlipidemia   . Hypertension   . 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 Melanoma ([place])    melanoma  . Migraines    "til ~ 1980"  . PONV (postoperative nausea and vomiting)   . Restless leg syndrome   . Sciatic nerve pain    "from pinched nerve"  . Sleep apnea    uses CPAP  . Stroke [facility]) 2014   no deficits  . Weakness of right side of body    "I've had PT for it; they don't know what it's from".  CORTISONE INJECTION INTO BACK 08/30/12     Past Surgical History:  Procedure Laterality Date  . [place] STUDY N/A 12/29/2015   Procedure: [place] STUDY;  Surgeon: Manus Gunning, MD;  Location: [facility];  Service: Gastroenterology;  Laterality: N/A;  . CARPAL TUNNEL RELEASE Left 07/02/2015   Procedure: CARPAL TUNNEL RELEASE;  Surgeon: Frederik Pear, MD;  Location: [facility];  Service: Orthopedics;  Laterality: Left;  . CATARACT EXTRACTION, BILATERAL Bilateral    Oct and Nov 2017  . COLONOSCOPY  06/30/2019  . CORONARY ANGIOPLASTY WITH STENT PLACEMENT  06/2011   "1"  . CORONARY ARTERY BYPASS GRAFT  2005   CABG X 2  . DILATION AND CURETTAGE OF UTERUS     "more than once"  . ELBOW ARTHROSCOPY Left 07/02/2015   Procedure: ARTHROSCOPY LEFT ELBOW WITH DEBRIDEMENT AND REMOVAL LOOSE BODY;  Surgeon: Frederik Pear, MD;  Location: [facility];  Service: Orthopedics;  Laterality: Left;  . ESOPHAGEAL DILATION   01/21/2020   Procedure: ESOPHAGEAL DILATION;  Surgeon: Lavena Bullion, DO;  Location: [facility];  Service: Gastroenterology;;  . ESOPHAGEAL MANOMETRY N/A 12/29/2015   Procedure: ESOPHAGEAL MANOMETRY (EM);  Surgeon: Manus Gunning, MD;  Location: [facility];  Service: Gastroenterology;  Laterality: N/A;  . ESOPHAGEAL MANOMETRY N/A 07/30/2019   Procedure: ESOPHAGEAL MANOMETRY (EM);  Surgeon: Yetta Flock, MD;  Location: [facility];  Service: Gastroenterology;  Laterality: N/A;  . ESOPHAGOGASTRODUODENOSCOPY (EGD) WITH PROPOFOL N/A 01/21/2020   Procedure: ESOPHAGOGASTRODUODENOSCOPY (EGD) WITH PROPOFOL;  Surgeon: Lavena Bullion, DO;  Location: [facility];  Service: Gastroenterology;  Laterality: N/A;  . FRACTURE SURGERY  ~ 2005   nose  . KNEE ARTHROSCOPY  09/04/2012   Procedure: ARTHROSCOPY KNEE;  Surgeon: Gearlean Alf, MD;  Location: [facility];  Service: Orthopedics;  Laterality: Right;  right knee arthroscopy with medial and lateral meniscus debridement  . MELANOMA EXCISION  Right 10/13/2016   right side of neck  . MOUTH SURGERY  2004   "bone replacement; had cadavear bones put in; face was collapsing"  . NASAL SEPTUM SURGERY  ~ 1986  . TOTAL KNEE ARTHROPLASTY Right 07/07/2013   Procedure: RIGHT TOTAL KNEE ARTHROPLASTY;  Surgeon: Gearlean Alf, MD;  Location: [facility];  Service: Orthopedics;  Laterality: Right;  . TRANSORAL INCISIONLESS FUNDOPLICATION N/A 5/57/3220   Procedure: TRANSORAL INCISIONLESS FUNDOPLICATION;  Surgeon: Lavena Bullion, DO;  Location: [facility];  Service: Gastroenterology;  Laterality: N/A;  . UPPER GASTROINTESTINAL ENDOSCOPY     Family History  Problem Relation Age of Onset  . Breast cancer Mother 72  . Heart disease Father 24       sudden onset due to CAD  . Hypertension Sister   . Dementia Sister   . Diabetes Sister   . Cancer - Colon Sister 64  . Colon cancer Sister   . GER disease Daughter   . Hypertension Daughter   . Heart  disease Brother   . Hypertension Brother   . Heart attack Neg Hx   . Stroke Neg Hx   . Esophageal cancer Neg Hx   . Rectal cancer Neg Hx   . Stomach cancer Neg Hx    Social History   Tobacco Use  . Smoking status: Never Smoker  . Smokeless tobacco: Never Used  Vaping Use  . Vaping Use: Never used  Substance Use Topics  . Alcohol use: Yes    Alcohol/week: 1.0 standard drink    Types: 1 Glasses of wine per week    Comment: "occasionally drink wine"  . Drug use: No   Current Outpatient Medications  Medication Sig Dispense Refill  . aspirin EC 81 MG tablet Take 1 tablet (81 mg total) by mouth daily.    [name] atorvastatin (LIPITOR) 80 MG tablet TAKE 1 TABLET BY MOUTH EVERY DAY 90 tablet 3  . BLINK TEARS 0.25 % SOLN Place 1 drop into both eyes 3 (three) times daily as needed (dry/irritated eyes.).    [name] calcium-vitamin D (OSCAL WITH D) 500-200 MG-UNIT tablet Take 1 tablet by mouth 2 (two) times daily.    [name] CARAFATE 1 GM/10ML suspension TAKE 10 MLS (1 G TOTAL) BY MOUTH EVERY 6 (SIX) HOURS AS NEEDED. 420 mL 3  . cetirizine (ZYRTEC) 10 MG tablet Take 10 mg by mouth daily.     . chlorthalidone (HYGROTON) 25 MG tablet TAKE 1 TABLET BY MOUTH EVERY DAY 90 tablet 3  . CINNAMON PO Take 1,000 mg by mouth in the morning and at bedtime.    . clonazePAM (KLONOPIN) 1 MG tablet TAKE 2 TABLETS BY MOUTH AT BEDTIME 60 tablet 0  . gabapentin (NEURONTIN) 300 MG capsule TAKE 2 CAPSULES (600 MG TOTAL) BY MOUTH AT BEDTIME. 180 capsule 1  . Insulin Pen Needle (NOVOFINE) 32G X 6 MM MISC USE TO INJECT VICTOZA DAILY. DX: E11.9 100 each 3  . Lancet Device MISC Use to check blood sugar twice daily. 1 each 0  . Lancets (FREESTYLE) lancets Use to check blood sugar twice daily 100 each 11  . metFORMIN (GLUCOPHAGE-XR) 500 MG 24 hr tablet TAKE 2 TABLETS BY MOUTH TWICE A DAY 360 tablet 1  . metoprolol succinate (TOPROL-XL) 100 MG 24 hr tablet TAKE 1 TABLET (100 MG TOTAL) BY MOUTH DAILY. TAKE WITH OR IMMEDIATELY FOLLOWING A  MEAL. 90 tablet 3  . Multiple Vitamins-Minerals (PRESERVISION AREDS PO) Take 1 tablet by mouth 2 (two) times  daily. Reported on 12/23/2015    . MYRBETRIQ 25 MG TB24 tablet TAKE 1 TABLET BY MOUTH EVERY DAY 90 tablet 3  . nitroGLYCERIN (NITROSTAT) 0.4 MG SL tablet PLACE 1 TABLET (0.4 MG TOTAL) UNDER THE TONGUE EVERY 5 (FIVE) MINUTES AS NEEDED FOR CHEST PAIN 25 tablet 0  . ONETOUCH ULTRA test strip USE TO CHECK BLOOD SUGAR TWICE DAILY 150 strip 3  . Probiotic Product (ALIGN PO) Take by mouth.    . simethicone (MYLICON) 40 FU/9.3AT drops Take 1.2 mLs (80 mg total) by mouth 4 (four) times daily as needed for flatulence. 30 mL 0  . triamcinolone cream (KENALOG) 0.1 % Apply 1 application topically 2 (two) times daily. 30 g 0  . venlafaxine XR (EFFEXOR-XR) 75 MG 24 hr capsule TAKE 1 CAPSULE (75 MG TOTAL) BY MOUTH DAILY WITH BREAKFAST. 90 capsule 1  . VICTOZA 18 MG/3ML SOPN INJECT 1.2 MG UNDER THE SKIN ONCE DAILY 18 mL 5   No current facility-administered medications for this visit.   Allergies  Allergen Reactions  . Hydrocodone-Acetaminophen Other (See Comments)    "Changed personality" "made me very mean"  . Percocet [Oxycodone-Acetaminophen] Other (See Comments)    hallucination  . Sulfa Antibiotics Other (See Comments)    hallucinations     Review of Systems: All systems reviewed and negative except where noted in HPI.   Lab Results  Component Value Date   CREATININE 1.02 08/05/2020   BUN 30 (H) 08/05/2020   NA 141 08/05/2020   K 3.8 08/05/2020   CL 100 08/05/2020   CO2 34 (H) 08/05/2020    Lab Results  Component Value Date   ALT 20 08/05/2020   AST 20 08/05/2020   ALKPHOS 57 08/05/2020   BILITOT 0.4 08/05/2020     Physical Exam: BP (!) 166/76   Pulse 60   Ht 5' 2.5" (1.588 m)   Wt 148 lb (67.1 kg)   BMI 26.64 kg/m  Constitutional: Pleasant,well-developed, female in no acute distress. Neurological: Alert and oriented to person place and time. Psychiatric: Normal mood  and affect. Behavior is normal.   ASSESSMENT AND PLAN: 79 year old female here for reassessment of the following:  Altered bowel habits - she reports loose stools as her main problem, however upon further questioning about this this appears very episodic, perhaps once every 1 to 2 weeks, in between episodes she has baseline constipation and hard time completely evacuating.  Daily fiber supplement has not provided much benefit.  Colonoscopy did not show any concerning lesions and no evidence of colitis or microscopic colitis.  Sounds like she might be having overflow incontinence when she has these urgent loose stools.  Recommend treating with MiraLAX daily to twice daily to produce good bowel movement every day and hopefully this will prevent these episodes of episodic loose stools with urgency, if related to overflow.  She will try this for the next few weeks and let me know how she does.  If no improvement with this and she still has altered bowel habits, could consider holding the Victoza to see if that is related at all.  She will otherwise hold Imodium.  GERD - status post TIF and off PPI and generally doing really well.  She had some mild reflux symptoms in the past week.  Instructed her its okay to use some Gaviscon or Tums as needed for mild symptoms.  If something more frequent than this that she can consider some Pepcid but I really do not think she needs  PPI right now and hopefully to folds up over time.  She agreed and will use Gaviscon as needed if any breakthrough, generally however has done really well status post TIF.  Columbine Cellar, MD [facility]

## 2020-10-13 NOTE — Patient Instructions (Signed)
If you are age 78 or older, your body mass index should be between 23-30. Your Body mass index is 26.64 kg/m. If this is out of the aforementioned range listed, please consider follow up with your Primary Care Provider.  If you are age 65 or younger, your body mass index should be between 19-25. Your Body mass index is 26.64 kg/m. If this is out of the aformentioned range listed, please consider follow up with your Primary Care Provider.   Use Miralax once to twice daily.  Discontinue imodium.  You can use Gaviscon or TUMS for heartburn daily.  Thank you for entrusting me with your care and for choosing [facility], Dr. [place] Cellar

## 2020-10-27 DIAGNOSIS — D2272 Melanocytic nevi of left lower limb, including hip: Secondary | ICD-10-CM | POA: Diagnosis not present

## 2020-10-27 DIAGNOSIS — D2261 Melanocytic nevi of right upper limb, including shoulder: Secondary | ICD-10-CM | POA: Diagnosis not present

## 2020-10-27 DIAGNOSIS — D2271 Melanocytic nevi of right lower limb, including hip: Secondary | ICD-10-CM | POA: Diagnosis not present

## 2020-10-27 DIAGNOSIS — Z85820 Personal history of malignant melanoma of skin: Secondary | ICD-10-CM | POA: Diagnosis not present

## 2020-10-27 DIAGNOSIS — D2371 Other benign neoplasm of skin of right lower limb, including hip: Secondary | ICD-10-CM | POA: Diagnosis not present

## 2020-10-27 DIAGNOSIS — L821 Other seborrheic keratosis: Secondary | ICD-10-CM | POA: Diagnosis not present

## 2020-10-27 DIAGNOSIS — L812 Freckles: Secondary | ICD-10-CM | POA: Diagnosis not present

## 2020-10-27 DIAGNOSIS — D2262 Melanocytic nevi of left upper limb, including shoulder: Secondary | ICD-10-CM | POA: Diagnosis not present

## 2020-10-27 DIAGNOSIS — D225 Melanocytic nevi of trunk: Secondary | ICD-10-CM | POA: Diagnosis not present

## 2020-11-07 ENCOUNTER — Other Ambulatory Visit: Payer: Self-pay | Admitting: Cardiology

## 2020-11-17 ENCOUNTER — Telehealth: Payer: Self-pay | Admitting: Cardiology

## 2020-11-17 NOTE — Telephone Encounter (Signed)
Pt c/o Shortness Of Breath: STAT if SOB developed within the last 24 hours or pt is noticeably SOB on the phone  1. Are you currently SOB (can you hear that pt is SOB on the phone)? No   2. How long have you been experiencing SOB? 5 months   3. Are you SOB when sitting or when up moving around? Moving around   4. Are you currently experiencing any other symptoms? No   Morgan [name] has an appointment scheduled in regards to this for tomorrow, 11/18/20 at 2:20 PM.

## 2020-11-17 NOTE — Telephone Encounter (Signed)
Called the patient and left a message to call back if needed.

## 2020-11-18 ENCOUNTER — Other Ambulatory Visit: Payer: Self-pay

## 2020-11-18 ENCOUNTER — Encounter: Payer: Self-pay | Admitting: Cardiology

## 2020-11-18 ENCOUNTER — Encounter: Payer: Self-pay | Admitting: *Deleted

## 2020-11-18 ENCOUNTER — Ambulatory Visit: Payer: Medicare PPO | Admitting: Cardiology

## 2020-11-18 VITALS — BP 150/90 | HR 64 | Ht 62.0 in | Wt 147.0 lb

## 2020-11-18 DIAGNOSIS — G4733 Obstructive sleep apnea (adult) (pediatric): Secondary | ICD-10-CM | POA: Diagnosis not present

## 2020-11-18 DIAGNOSIS — I10 Essential (primary) hypertension: Secondary | ICD-10-CM | POA: Diagnosis not present

## 2020-11-18 DIAGNOSIS — R0602 Shortness of breath: Secondary | ICD-10-CM | POA: Diagnosis not present

## 2020-11-18 DIAGNOSIS — I480 Paroxysmal atrial fibrillation: Secondary | ICD-10-CM

## 2020-11-18 DIAGNOSIS — R5383 Other fatigue: Secondary | ICD-10-CM | POA: Diagnosis not present

## 2020-11-18 DIAGNOSIS — I2510 Atherosclerotic heart disease of native coronary artery without angina pectoris: Secondary | ICD-10-CM | POA: Diagnosis not present

## 2020-11-18 DIAGNOSIS — I2583 Coronary atherosclerosis due to lipid rich plaque: Secondary | ICD-10-CM

## 2020-11-18 NOTE — Telephone Encounter (Signed)
Pt is scheduled for evaluation with Dr Anne Fu today at 2:20 pm.

## 2020-11-18 NOTE — Progress Notes (Signed)
Cardiology Office Note:    Date:  11/18/2020   ID:  Morgan [name], DOB 01-26-1941, MRN [id number]:[id number]  PCP:  Jinny Sanders, MD  [facility] Cardiologist:  Candee Furbish, MD  [facility] Electrophysiologist:  None   Referring MD: Jinny Sanders, MD    History of Present Illness:    Morgan [name] is a 80 y.o. female here for the follow-up of CAD post CABG in 2006. Diagonal stent after CABG. Cerebellar stroke in 2015. Angioedema right posterior neck melanoma discovered by Dr. Radford Pax.  Nuclear stress test 2017 reassuring.  Had a fundoplication for her esophagus in March 2021.  GERD.  Prior shortness of breath attributed to deconditioning.  Echo has been reassuring.  SOB with incline and has to stop. Bothers husband.   Fatigue  Poor balance Has fallen, broke both arms.   Past Medical History:  Diagnosis Date  . Angina   . Arthritis   . Atrial fibrillation (HCC)    ASPIRIN FOR BLOOD THINNER  . Atypical mole 09/22/2016  . Bulging discs    lumbar   . Complication of anesthesia    pt states has choking sensation with ET tube   . Coronary artery disease   . Depression   . Diabetes mellitus    diet controlled/on meds  . Family history of breast cancer   . Family history of colon cancer   . Fibromyalgia   . GERD (gastroesophageal reflux disease)   . H/O hiatal hernia   . Headache(784.0)    "recurring"  . High cholesterol   . History of colon polyps   . Hyperlipidemia   . Hypertension   . 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 Melanoma ([place])    melanoma  . Migraines    "til ~ 1980"  . PONV (postoperative nausea and vomiting)   . Restless leg syndrome   . Sciatic nerve pain    "from pinched nerve"  . Sleep apnea    uses CPAP  . Stroke [facility]) 2014   no deficits  . Weakness of right side of body    "I've had PT for it; they don't know what it's from".  CORTISONE INJECTION INTO BACK 08/30/12    Past Surgical History:  Procedure Laterality Date  . [place] STUDY N/A  12/29/2015   Procedure: [place] STUDY;  Surgeon: Manus Gunning, MD;  [facility]: [facility];  Service: Gastroenterology;  Laterality: N/A;  . CARPAL TUNNEL RELEASE Left 07/02/2015   Procedure: CARPAL TUNNEL RELEASE;  Surgeon: Frederik Pear, MD;  Location: [place];  Service: Orthopedics;  Laterality: Left;  . CATARACT EXTRACTION, BILATERAL Bilateral    Oct and Nov 2017  . COLONOSCOPY  06/30/2019  . CORONARY ANGIOPLASTY WITH STENT PLACEMENT  06/2011   "1"  . CORONARY ARTERY BYPASS GRAFT  2005   CABG X 2  . DILATION AND CURETTAGE OF UTERUS     "more than once"  . ELBOW ARTHROSCOPY Left 07/02/2015   Procedure: ARTHROSCOPY LEFT ELBOW WITH DEBRIDEMENT AND REMOVAL LOOSE BODY;  Surgeon: Frederik Pear, MD;  Location: [place];  Service: Orthopedics;  Laterality: Left;  . ESOPHAGEAL DILATION  01/21/2020   Procedure: ESOPHAGEAL DILATION;  Surgeon: Lavena Bullion, DO;  [facility];  Service: Gastroenterology;;  . ESOPHAGEAL MANOMETRY N/A 12/29/2015   Procedure: ESOPHAGEAL MANOMETRY (EM);  Surgeon: Manus Gunning, MD;  [facility];  Service: Gastroenterology;  Laterality: N/A;  . ESOPHAGEAL MANOMETRY N/A 07/30/2019  Procedure: ESOPHAGEAL MANOMETRY (EM);  Surgeon: Benancio Deeds, MD;  Location: [facility];  Service: Gastroenterology;  Laterality: N/A;  . ESOPHAGOGASTRODUODENOSCOPY (EGD) WITH PROPOFOL N/A 01/21/2020   Procedure: ESOPHAGOGASTRODUODENOSCOPY (EGD) WITH PROPOFOL;  Surgeon: Shellia Cleverly, DO;  [facility];  Service: Gastroenterology;  Laterality: N/A;  . FRACTURE SURGERY  ~ 2005   nose  . KNEE ARTHROSCOPY  09/04/2012   Procedure: ARTHROSCOPY KNEE;  Surgeon: Loanne Drilling, MD;  [facility];  Service: Orthopedics;  Laterality: Right;  right knee arthroscopy with medial and lateral meniscus debridement  . 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 MOUTH SURGERY  2004   "bone  replacement; had cadavear bones put in; face was collapsing"  . NASAL SEPTUM SURGERY  ~ 1986  . TOTAL KNEE ARTHROPLASTY Right 07/07/2013   Procedure: RIGHT TOTAL KNEE ARTHROPLASTY;  Surgeon: Loanne Drilling, MD;  [facility];  Service: Orthopedics;  Laterality: Right;  . TRANSORAL INCISIONLESS FUNDOPLICATION N/A 01/21/2020   Procedure: TRANSORAL INCISIONLESS FUNDOPLICATION;  Surgeon: Shellia Cleverly, DO;  [facility];  Service: Gastroenterology;  Laterality: N/A;  . UPPER GASTROINTESTINAL ENDOSCOPY      Current Medications: Current Meds  Medication Sig  . aspirin EC 81 MG tablet Take 1 tablet (81 mg total) by mouth daily.  [name] atorvastatin (LIPITOR) 80 MG tablet TAKE 1 TABLET BY MOUTH EVERY DAY  . BLINK TEARS 0.25 % SOLN Place 1 drop into both eyes 3 (three) times daily as needed (dry/irritated eyes.).  [name] calcium-vitamin D (OSCAL WITH D) 500-200 MG-UNIT tablet Take 1 tablet by mouth 2 (two) times daily.  [name] CARAFATE 1 GM/10ML suspension TAKE 10 MLS (1 G TOTAL) BY MOUTH EVERY 6 (SIX) HOURS AS NEEDED.  [name] cetirizine (ZYRTEC) 10 MG tablet Take 10 mg by mouth daily.   . chlorthalidone (HYGROTON) 25 MG tablet TAKE 1 TABLET BY MOUTH EVERY DAY  . CINNAMON PO Take 1,000 mg by mouth in the morning and at bedtime.  . clonazePAM (KLONOPIN) 1 MG tablet TAKE 2 TABLETS BY MOUTH AT BEDTIME  . gabapentin (NEURONTIN) 300 MG capsule TAKE 2 CAPSULES (600 MG TOTAL) BY MOUTH AT BEDTIME.  . Insulin Pen Needle (NOVOFINE) 32G X 6 MM MISC USE TO INJECT VICTOZA DAILY. DX: E11.9  . Lancet Device MISC Use to check blood sugar twice daily.  . Lancets (FREESTYLE) lancets Use to check blood sugar twice daily  . metFORMIN (GLUCOPHAGE-XR) 500 MG 24 hr tablet TAKE 2 TABLETS BY MOUTH TWICE A DAY  . metoprolol succinate (TOPROL-XL) 100 MG 24 hr tablet TAKE 1 TABLET (100 MG TOTAL) BY MOUTH DAILY. TAKE WITH OR IMMEDIATELY FOLLOWING A MEAL.  . Multiple Vitamins-Minerals (PRESERVISION AREDS PO) Take 1 tablet by  mouth 2 (two) times daily. Reported on 12/23/2015  . MYRBETRIQ 25 MG TB24 tablet TAKE 1 TABLET BY MOUTH EVERY DAY  . nitroGLYCERIN (NITROSTAT) 0.4 MG SL tablet PLACE 1 TABLET (0.4 MG TOTAL) UNDER THE TONGUE EVERY 5 (FIVE) MINUTES AS NEEDED FOR CHEST PAIN  . ONETOUCH ULTRA test strip USE TO CHECK BLOOD SUGAR TWICE DAILY  . polyethylene glycol (MIRALAX) 17 g packet Use as directed once to twice daily  . Probiotic Product (ALIGN PO) Take by mouth.  . simethicone (MYLICON) 40 MG/0.6ML drops Take 1.2 mLs (80 mg total) by mouth 4 (four) times daily as needed for flatulence.  . triamcinolone cream (KENALOG) 0.1 % Apply 1 application topically 2 (two) times daily.  [name] venlafaxine  XR (EFFEXOR-XR) 75 MG 24 hr capsule TAKE 1 CAPSULE (75 MG TOTAL) BY MOUTH DAILY WITH BREAKFAST.  [name]. VICTOZA 18 MG/3ML SOPN INJECT 1.2 MG UNDER THE SKIN ONCE DAILY     Allergies:   Hydrocodone-acetaminophen, Percocet [oxycodone-acetaminophen], and Sulfa antibiotics   Social History   Socioeconomic History  . Marital status: Married    Spouse name: Not on file  . Number of children: 2  . Years of education: college  . Highest education level: Not on file  Occupational History  . Occupation: Retired   Tobacco Use  . Smoking status: Never Smoker  . Smokeless tobacco: Never Used  Vaping Use  . Vaping Use: Never used  Substance and Sexual Activity  . Alcohol use: Yes    Alcohol/week: 1.0 standard drink    Types: 1 Glasses of wine per week    Comment: "occasionally drink wine"  . Drug use: No  . Sexual activity: Yes  Other Topics Concern  . Not on file  Social History Narrative   Drinks 1 cup of coffee a day    Social Determinants of Health   Financial Resource Strain: Low Risk   . Difficulty of Paying Living Expenses: Not hard at all  Food Insecurity: No Food Insecurity  . Worried About Programme researcher, broadcasting/film/videounning Out of Food in the Last Year: Never true  . Ran Out of Food in the Last Year: Never true  Transportation Needs: No  Transportation Needs  . Lack of Transportation (Medical): No  . Lack of Transportation (Non-Medical): No  Physical Activity: Inactive  . Days of Exercise per Week: 0 days  . Minutes of Exercise per Session: 0 min  Stress: No Stress Concern Present  . Feeling of Stress : Not at all  Social Connections: Not on file     Family History: The patient's family history includes Breast cancer (age of onset: 6260) in her mother; Cancer - Colon (age of onset: 6160) in her sister; Colon cancer in her sister; Dementia in her sister; Diabetes in her sister; GER disease in her daughter; Heart disease in her brother; Heart disease (age of onset: 4448) in her father; Hypertension in her brother, daughter, and sister. There is no history of Heart attack, Stroke, Esophageal cancer, Rectal cancer, or Stomach cancer.  ROS:   Please see the history of present illness.    No bleeding no fevers no chills no syncope all other systems reviewed and are negative.  EKGs/Labs/Other Studies Reviewed:    The following studies were reviewed today:  Echo 2018 -EF 60% diastolic dysfunction with elevated left atrial filling pressures PA pressures 31, unchanged from prior  EKG:  EKG is  ordered today.  The ekg ordered today demonstrates sinus rhythm 64 artifact  Recent Labs: 01/22/2020: Hemoglobin 9.5; Platelets 325 08/05/2020: ALT 20; BUN 30; Creatinine, Ser 1.02; Potassium 3.8; Sodium 141  Recent Lipid Panel    Component Value Date/Time   CHOL 113 08/05/2020 0822   TRIG 65.0 08/05/2020 0822   HDL 53.60 08/05/2020 0822   CHOLHDL 2 08/05/2020 0822   VLDL 13.0 08/05/2020 0822   LDLCALC 47 08/05/2020 0822       Physical Exam:    VS:  BP (!) 150/90   Pulse 64   Ht 5\' 2"  (1.575 m)   Wt 147 lb (66.7 kg)   SpO2 96%   BMI 26.89 kg/m     Wt Readings from Last 3 Encounters:  11/18/20 147 lb (66.7 kg)  10/13/20 148 lb (67.1 kg)  08/31/20 151 lb (68.5 kg)     GEN:  Well nourished, well developed in no acute  distress HEENT: Normal NECK: No JVD; No carotid bruits LYMPHATICS: No lymphadenopathy CARDIAC: RRR, no murmurs, rubs, gallops RESPIRATORY:  Clear to auscultation without rales, wheezing or rhonchi  ABDOMEN: Soft, non-tender, non-distended MUSCULOSKELETAL:  No edema; No deformity  SKIN: Warm and dry NEUROLOGIC:  Alert and oriented x 3 PSYCHIATRIC:  Normal affect   ASSESSMENT:    1. Coronary artery disease due to lipid rich plaque   2. Obstructive sleep apnea   3. Paroxysmal A-fib ([place])   4. Essential hypertension   5. Shortness of breath   6. Fatigue, unspecified type    PLAN:    In order of problems listed above:   Coronary artery disease status post bypass -Had subsequent angina and had diagonal stent placed. -She has continued with high-dose aspirin because of prior stroke.   -She has been experiencing more shortness of breath/atypical chest pain, fatigue which could be her anginal equivalent.  Because of this we will check an echocardiogram to ensure proper structure and function of her heart and we will check nuclear stress test or Lexiscan stress test.  Disequilibrium -Prior cerebellar stroke.  Physical therapy previously.  She has fallen unfortunately.  Broke her arm previously.  Being careful.  Hyperlipidemia -Continue with high intensity dose of atorvastatin, LDL 47 excellent control.  No myalgias on atorvastatin.  Diastolic dysfunction with elevated left atrial filling pressures -Hydrochlorothiazide was added at 1 point then stopped subsequently. Now on chlorthalidone.   Continue with good blood pressure control.  Continue with exercise and conditioning efforts.  No changes made.   Diabetes with hypertension -Well-controlled.  Hemoglobin A1c 6.3.  Excellent management per Dr. Diona Browner  Obstructive sleep apnea -CPAP Dr. Radford Pax, stable.  GERD, polyps -Procedure 3/21-esophageal transoral fundoplication.  Doing well.  Lost some weight.   28-month follow-up       Medication Adjustments/Labs and Tests Ordered: Current medicines are reviewed at length with the patient today.  Concerns regarding medicines are outlined above.  Orders Placed This Encounter  Procedures  . MYOCARDIAL PERFUSION IMAGING  . EKG 12-Lead  . ECHOCARDIOGRAM COMPLETE   No orders of the defined types were placed in this encounter.   Patient Instructions  Medication Instructions:  The current medical regimen is effective;  continue present plan and medications. *If you need a refill on your cardiac medications before your next appointment, please call your pharmacy*  Testing/Procedures: Your physician has requested that you have an echocardiogram. Echocardiography is a painless test that uses sound waves to create images of your heart. It provides your doctor with information about the size and shape of your heart and how well your heart's chambers and valves are working. This procedure takes approximately one hour. There are no restrictions for this procedure.  Your physician has requested that you have a lexiscan myoview. For further information please visit [email or web address]. Please follow instruction sheet, as given.  Follow-Up: At [facility], you and your health needs are our priority.  As part of our continuing mission to provide you with exceptional heart care, we have created designated Provider Care Teams.  These Care Teams include your primary Cardiologist (physician) and Advanced Practice Providers (APPs -  Physician Assistants and Nurse Practitioners) who all work together to provide you with the care you need, when you need it.  We recommend signing up for the patient portal called "MyChart".  Sign up information is provided on  this After Visit Summary.  MyChart is used to connect with patients for Virtual Visits (Telemedicine).  Patients are able to view lab/test results, encounter notes, upcoming appointments, etc.  Non-urgent messages can be sent to your  provider as well.   To learn more about what you can do with MyChart, go to [email or web address].    Your next appointment:   6 month(s)  The format for your next appointment:   In Person  Provider:   Candee Furbish, MD   Thank you for choosing [facility]!!        Signed, Candee Furbish, MD  11/18/2020 3:29 PM    [place]

## 2020-11-18 NOTE — Telephone Encounter (Signed)
Pt seen today in office by Dr Anne Fu.  Echo and Lexiscan were ordered.

## 2020-11-18 NOTE — Patient Instructions (Addendum)
Medication Instructions:  °The current medical regimen is effective;  continue present plan and medications. ° °*If you need a refill on your cardiac medications before your next appointment, please call your pharmacy* ° °Testing/Procedures: °Your physician has requested that you have an echocardiogram. Echocardiography is a painless test that uses sound waves to create images of your heart. It provides your doctor with information about the size and shape of your heart and how well your heart’s chambers and valves are working. This procedure takes approximately one hour. There are no restrictions for this procedure. ° °Your physician has requested that you have a lexiscan myoview. For further information please visit [email or web address]. Please follow instruction sheet, as given. ° °Follow-Up: °At [facility], you and your health needs are our priority.  As part of our continuing mission to provide you with exceptional heart care, we have created designated Provider Care Teams.  These Care Teams include your primary Cardiologist (physician) and Advanced Practice Providers (APPs -  Physician Assistants and Nurse Practitioners) who all work together to provide you with the care you need, when you need it. ° °We recommend signing up for the patient portal called "MyChart".  Sign up information is provided on this After Visit Summary.  MyChart is used to connect with patients for Virtual Visits (Telemedicine).  Patients are able to view lab/test results, encounter notes, upcoming appointments, etc.  Non-urgent messages can be sent to your provider as well.   °To learn more about what you can do with MyChart, go to [email or web address].   ° °Your next appointment:   °6 month(s) ° °The format for your next appointment:   °In Person ° °Provider:   °Mark Skains, MD ° ° °Thank you for choosing [facility]!! ° ° ° ° ° °

## 2020-11-23 ENCOUNTER — Other Ambulatory Visit: Payer: Self-pay

## 2020-11-23 ENCOUNTER — Other Ambulatory Visit: Payer: Medicare PPO

## 2020-11-23 DIAGNOSIS — Z20822 Contact with and (suspected) exposure to covid-19: Secondary | ICD-10-CM

## 2020-11-24 LAB — SARS-COV-2, NAA 2 DAY TAT

## 2020-11-24 LAB — NOVEL CORONAVIRUS, NAA: SARS-CoV-2, NAA: NOT DETECTED

## 2020-12-02 ENCOUNTER — Other Ambulatory Visit: Payer: Self-pay | Admitting: Family Medicine

## 2020-12-02 NOTE — Telephone Encounter (Signed)
Last office visit 08/10/2020 for DM.  Last refilled 10/01/2020 for #60 with no refills.  CPE scheduled for 02/10/2021.

## 2020-12-07 ENCOUNTER — Telehealth (HOSPITAL_COMMUNITY): Payer: Self-pay

## 2020-12-07 NOTE — Telephone Encounter (Signed)
Spoke with the patient, detailed instructions given. She stated that she would be here for her test. Asked to call back with any questions. S.Kimley Apsey EMTP 

## 2020-12-08 ENCOUNTER — Other Ambulatory Visit (HOSPITAL_COMMUNITY): Payer: Medicare PPO

## 2020-12-09 ENCOUNTER — Other Ambulatory Visit: Payer: Self-pay

## 2020-12-09 ENCOUNTER — Ambulatory Visit (HOSPITAL_BASED_OUTPATIENT_CLINIC_OR_DEPARTMENT_OTHER): Payer: Medicare PPO

## 2020-12-09 ENCOUNTER — Ambulatory Visit (HOSPITAL_COMMUNITY): Payer: Medicare PPO | Attending: Cardiology

## 2020-12-09 DIAGNOSIS — I2583 Coronary atherosclerosis due to lipid rich plaque: Secondary | ICD-10-CM | POA: Insufficient documentation

## 2020-12-09 DIAGNOSIS — I2510 Atherosclerotic heart disease of native coronary artery without angina pectoris: Secondary | ICD-10-CM

## 2020-12-09 DIAGNOSIS — R0602 Shortness of breath: Secondary | ICD-10-CM | POA: Diagnosis not present

## 2020-12-09 DIAGNOSIS — R5383 Other fatigue: Secondary | ICD-10-CM

## 2020-12-09 LAB — MYOCARDIAL PERFUSION IMAGING
LV dias vol: 66 mL (ref 46–106)
LV sys vol: 27 mL
Peak HR: 85 {beats}/min
Rest HR: 55 {beats}/min
SDS: 3
SRS: 0
SSS: 3
TID: 1.06

## 2020-12-09 LAB — ECHOCARDIOGRAM COMPLETE
Area-P 1/2: 3.53 cm2
Height: 62 in
S' Lateral: 3.2 cm
Weight: 2352 oz

## 2020-12-09 MED ORDER — TECHNETIUM TC 99M TETROFOSMIN IV KIT
8.9000 | PACK | Freq: Once | INTRAVENOUS | Status: AC | PRN
  Administered 2020-12-09: 8.9 via INTRAVENOUS
  Filled 2020-12-09: qty 9

## 2020-12-09 MED ORDER — PERFLUTREN LIPID MICROSPHERE
1.0000 mL | INTRAVENOUS | Status: AC | PRN
  Administered 2020-12-09: 2 mL via INTRAVENOUS

## 2020-12-09 MED ORDER — REGADENOSON 0.4 MG/5ML IV SOLN
0.4000 mg | Freq: Once | INTRAVENOUS | Status: AC
  Administered 2020-12-09: 0.4 mg via INTRAVENOUS

## 2020-12-09 MED ORDER — TECHNETIUM TC 99M TETROFOSMIN IV KIT
27.0000 | PACK | Freq: Once | INTRAVENOUS | Status: AC | PRN
  Administered 2020-12-09: 27 via INTRAVENOUS
  Filled 2020-12-09: qty 27

## 2020-12-15 DIAGNOSIS — G4733 Obstructive sleep apnea (adult) (pediatric): Secondary | ICD-10-CM | POA: Diagnosis not present

## 2021-01-01 ENCOUNTER — Other Ambulatory Visit: Payer: Self-pay | Admitting: Family Medicine

## 2021-01-02 NOTE — Telephone Encounter (Signed)
Last office visit 08/10/2020 for DM.  Last refilled 07/01/20 for #180 with 1 refill.  CPE scheduled 02/10/2021.

## 2021-01-03 NOTE — Telephone Encounter (Signed)
Patient called stating that she is out of her Gabapentin and thought that she had one refill left. Apparently patient is out of refills. Patient would like the refill sent to the pharmacy. Patient aware that Dr. Diona Browner is out of the office today.

## 2021-01-03 NOTE — Telephone Encounter (Signed)
Noted  

## 2021-01-03 NOTE — Telephone Encounter (Signed)
Patient's husband left a voicemail stating that [facility] has the Gabapentin in stock. Morgan [name] is requesting that a 90 day supply be sent to the pharmacy for her.

## 2021-01-17 LAB — HM DIABETES FOOT EXAM

## 2021-01-20 ENCOUNTER — Telehealth: Payer: Self-pay | Admitting: Cardiology

## 2021-01-20 NOTE — Telephone Encounter (Signed)
Add on for tomorrow at 8:40am with Dr. Radford Pax.

## 2021-01-21 ENCOUNTER — Telehealth (INDEPENDENT_AMBULATORY_CARE_PROVIDER_SITE_OTHER): Payer: Medicare PPO | Admitting: Cardiology

## 2021-01-21 ENCOUNTER — Encounter: Payer: Self-pay | Admitting: Cardiology

## 2021-01-21 VITALS — Ht 62.0 in | Wt 145.0 lb

## 2021-01-21 DIAGNOSIS — G4733 Obstructive sleep apnea (adult) (pediatric): Secondary | ICD-10-CM | POA: Diagnosis not present

## 2021-01-21 DIAGNOSIS — I10 Essential (primary) hypertension: Secondary | ICD-10-CM | POA: Diagnosis not present

## 2021-01-21 NOTE — Patient Instructions (Signed)
Medication Instructions:  Your physician recommends that you continue on your current medications as directed. Please refer to the Current Medication list given to you today.  *If you need a refill on your cardiac medications before your next appointment, please call your pharmacy*   Lab Work: none If you have labs (blood work) drawn today and your tests are completely normal, you will receive your results only by: [name] MyChart Message (if you have MyChart) OR . A paper copy in the mail If you have any lab test that is abnormal or we need to change your treatment, we will call you to review the results.   Testing/Procedures: none   Follow-Up: At [facility], you and your health needs are our priority.  As part of our continuing mission to provide you with exceptional heart care, we have created designated Provider Care Teams.  These Care Teams include your primary Cardiologist (physician) and Advanced Practice Providers (APPs -  Physician Assistants and Nurse Practitioners) who all work together to provide you with the care you need, when you need it.  We recommend signing up for the patient portal called "MyChart".  Sign up information is provided on this After Visit Summary.  MyChart is used to connect with patients for Virtual Visits (Telemedicine).  Patients are able to view lab/test results, encounter notes, upcoming appointments, etc.  Non-urgent messages can be sent to your provider as well.   To learn more about what you can do with MyChart, go to [email or web address].    Your next appointment:   12 month(s)  The format for your next appointment:   Virtual  Provider:   Fransico Him, MD   Other Instructions

## 2021-01-21 NOTE — Progress Notes (Signed)
Virtual Visit via Video Note   This visit type was conducted due to national recommendations for restrictions regarding the COVID-19 Pandemic (e.g. social distancing) in an effort to limit this patient's exposure and mitigate transmission in our community.  Due to her co-morbid illnesses, this patient is at least at moderate risk for complications without adequate follow up.  This format is felt to be most appropriate for this patient at this time.  All issues noted in this document were discussed and addressed.  A limited physical exam was performed with this format.  Please refer to the patient's chart for her consent to telehealth for [facility].   Evaluation Performed:  Follow-up visit  This visit type was conducted due to national recommendations for restrictions regarding the COVID-19 Pandemic (e.g. social distancing).  This format is felt to be most appropriate for this patient at this time.  All issues noted in this document were discussed and addressed.  No physical exam was performed (except for noted visual exam findings with Video Visits).  Please refer to the patient's chart (MyChart message for video visits and phone note for telephone visits) for the patient's consent to telehealth for [facility].  Date:  01/21/2021   ID:  Morgan [name], DOB 11-01-1941, MRN [id number]  Patient Location:  Home  Provider location:   [place]  PCP:  Jinny Sanders, MD  Cardiologist:  Candee Furbish, MD  Sleep Medicine:  Fransico Him, MD Electrophysiologist:  None   Chief Complaint:  OSA  History of Present Illness:    Morgan [name] is a 80 y.o. female who presents via audio/video conferencing for a telehealth visit today.    Morgan [name] is a 80 y.o. female with a hx of OSA, HTN and obesity. She is doing well with her CPAP device and thinks that she has gotten used to it.  She tolerates the mask and feels the pressure is adequate.  Since going on CPAP she feels rested in the  am and has no significant daytime sleepiness.  She is having problems with dry mouth even though she is using her chin strap but the chin strap she is using is more than a year old and she has a new one she has not started.  She also has dry mouth during the day and thinks it is more related to medication.  She does not really have any nose dryness.  She does not think that he snores.    The patient does not have symptoms concerning for COVID-19 infection (fever, chills, cough, or new shortness of breath).   Prior CV studies:   The following studies were reviewed today:  PAP compliance download from [facility]  Past Medical History:  Diagnosis Date  . Angina   . Arthritis   . Atrial fibrillation (HCC)    ASPIRIN FOR BLOOD THINNER  . Atypical mole 09/22/2016  . Bulging discs    lumbar   . Complication of anesthesia    pt states has choking sensation with ET tube   . Coronary artery disease   . Depression   . Diabetes mellitus    diet controlled/on meds  . Family history of breast cancer   . Family history of colon cancer   . Fibromyalgia   . GERD (gastroesophageal reflux disease)   . H/O hiatal hernia   . Headache(784.0)    "recurring"  . High cholesterol   . History of colon polyps   . Hyperlipidemia   . Hypertension   .  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 Melanoma ([place])    melanoma  . Migraines    "til ~ 1980"  . PONV (postoperative nausea and vomiting)   . Restless leg syndrome   . Sciatic nerve pain    "from pinched nerve"  . Sleep apnea    uses CPAP  . Stroke [facility]) 2014   no deficits  . Weakness of right side of body    "I've had PT for it; they don't know what it's from".  CORTISONE INJECTION INTO BACK 08/30/12   Past Surgical History:  Procedure Laterality Date  . [place] STUDY N/A 12/29/2015   Procedure: [place] STUDY;  Surgeon: Manus Gunning, MD;  Location: [facility];  Service: Gastroenterology;  Laterality: N/A;  . CARPAL TUNNEL RELEASE Left  07/02/2015   Procedure: CARPAL TUNNEL RELEASE;  Surgeon: Frederik Pear, MD;  Location: [place];  Service: Orthopedics;  Laterality: Left;  . CATARACT EXTRACTION, BILATERAL Bilateral    Oct and Nov 2017  . COLONOSCOPY  06/30/2019  . CORONARY ANGIOPLASTY WITH STENT PLACEMENT  06/2011   "1"  . CORONARY ARTERY BYPASS GRAFT  2005   CABG X 2  . DILATION AND CURETTAGE OF UTERUS     "more than once"  . ELBOW ARTHROSCOPY Left 07/02/2015   Procedure: ARTHROSCOPY LEFT ELBOW WITH DEBRIDEMENT AND REMOVAL LOOSE BODY;  Surgeon: Frederik Pear, MD;  Location: [place];  Service: Orthopedics;  Laterality: Left;  . ESOPHAGEAL DILATION  01/21/2020   Procedure: ESOPHAGEAL DILATION;  Surgeon: Lavena Bullion, DO;  [facility];  Service: Gastroenterology;;  . ESOPHAGEAL MANOMETRY N/A 12/29/2015   Procedure: ESOPHAGEAL MANOMETRY (EM);  Surgeon: Manus Gunning, MD;  [facility];  Service: Gastroenterology;  Laterality: N/A;  . ESOPHAGEAL MANOMETRY N/A 07/30/2019   Procedure: ESOPHAGEAL MANOMETRY (EM);  Surgeon: Yetta Flock, MD;  [facility];  Service: Gastroenterology;  Laterality: N/A;  . ESOPHAGOGASTRODUODENOSCOPY (EGD) WITH PROPOFOL N/A 01/21/2020   Procedure: ESOPHAGOGASTRODUODENOSCOPY (EGD) WITH PROPOFOL;  Surgeon: Lavena Bullion, DO;  [facility];  Service: Gastroenterology;  Laterality: N/A;  . FRACTURE SURGERY  ~ 2005   nose  . KNEE ARTHROSCOPY  09/04/2012   Procedure: ARTHROSCOPY KNEE;  Surgeon: Gearlean Alf, MD;  [facility];  Service: Orthopedics;  Laterality: Right;  right knee arthroscopy with medial and lateral meniscus debridement  . 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 MOUTH SURGERY  2004   "bone replacement; had cadavear bones put in; face was collapsing"  . NASAL SEPTUM SURGERY  ~ 1986  . TOTAL KNEE ARTHROPLASTY Right 07/07/2013   Procedure: RIGHT TOTAL KNEE ARTHROPLASTY;   Surgeon: Gearlean Alf, MD;  [facility];  Service: Orthopedics;  Laterality: Right;  . TRANSORAL INCISIONLESS FUNDOPLICATION N/A 9/51/8841   Procedure: TRANSORAL INCISIONLESS FUNDOPLICATION;  Surgeon: Lavena Bullion, DO;  [facility];  Service: Gastroenterology;  Laterality: N/A;  . UPPER GASTROINTESTINAL ENDOSCOPY       Current Meds  Medication Sig  . aspirin EC 81 MG tablet Take 1 tablet (81 mg total) by mouth daily.  [name] atorvastatin (LIPITOR) 80 MG tablet TAKE 1 TABLET BY MOUTH EVERY DAY  . BLINK TEARS 0.25 % SOLN Place 1 drop into both eyes 3 (three) times daily as needed (dry/irritated eyes.).  [name] calcium-vitamin D (OSCAL WITH D) 500-200 MG-UNIT tablet Take 1 tablet by mouth 2 (two) times daily.  [name] CARAFATE 1  GM/10ML suspension TAKE 10 MLS (1 G TOTAL) BY MOUTH EVERY 6 (SIX) HOURS AS NEEDED.  [name] cetirizine (ZYRTEC) 10 MG tablet Take 10 mg by mouth daily.   . chlorthalidone (HYGROTON) 25 MG tablet TAKE 1 TABLET BY MOUTH EVERY DAY  . CINNAMON PO Take 1,000 mg by mouth in the morning and at bedtime.  . clonazePAM (KLONOPIN) 1 MG tablet TAKE 2 TABLETS BY MOUTH AT BEDTIME  . gabapentin (NEURONTIN) 300 MG capsule TAKE 2 CAPSULES (600 MG TOTAL) BY MOUTH AT BEDTIME.  . Insulin Pen Needle (NOVOFINE) 32G X 6 MM MISC USE TO INJECT VICTOZA DAILY. DX: E11.9  . Lancet Device MISC Use to check blood sugar twice daily.  . Lancets (FREESTYLE) lancets Use to check blood sugar twice daily  . metFORMIN (GLUCOPHAGE-XR) 500 MG 24 hr tablet TAKE 2 TABLETS BY MOUTH TWICE A DAY  . metoprolol succinate (TOPROL-XL) 100 MG 24 hr tablet TAKE 1 TABLET (100 MG TOTAL) BY MOUTH DAILY. TAKE WITH OR IMMEDIATELY FOLLOWING A MEAL.  . Multiple Vitamins-Minerals (PRESERVISION AREDS PO) Take 1 tablet by mouth 2 (two) times daily. Reported on 12/23/2015  . MYRBETRIQ 25 MG TB24 tablet TAKE 1 TABLET BY MOUTH EVERY DAY  . nitroGLYCERIN (NITROSTAT) 0.4 MG SL tablet PLACE 1 TABLET (0.4 MG TOTAL) UNDER THE TONGUE  EVERY 5 (FIVE) MINUTES AS NEEDED FOR CHEST PAIN  . ONETOUCH ULTRA test strip USE TO CHECK BLOOD SUGAR TWICE DAILY  . polyethylene glycol (MIRALAX) 17 g packet Use as directed once to twice daily  . Probiotic Product (ALIGN PO) Take by mouth.  . simethicone (MYLICON) 40 UX/3.2GM drops Take 1.2 mLs (80 mg total) by mouth 4 (four) times daily as needed for flatulence.  . venlafaxine XR (EFFEXOR-XR) 75 MG 24 hr capsule TAKE 1 CAPSULE (75 MG TOTAL) BY MOUTH DAILY WITH BREAKFAST.  [name] VICTOZA 18 MG/3ML SOPN INJECT 1.2 MG UNDER THE SKIN ONCE DAILY     Allergies:   Hydrocodone-acetaminophen, Percocet [oxycodone-acetaminophen], and Sulfa antibiotics   Social History   Tobacco Use  . Smoking status: Never Smoker  . Smokeless tobacco: Never Used  Vaping Use  . Vaping Use: Never used  Substance Use Topics  . Alcohol use: Yes    Alcohol/week: 1.0 standard drink    Types: 1 Glasses of wine per week    Comment: "occasionally drink wine"  . Drug use: No     Family Hx: The patient's family history includes Breast cancer (age of onset: 58) in her mother; Cancer - Colon (age of onset: 30) in her sister; Colon cancer in her sister; Dementia in her sister; Diabetes in her sister; GER disease in her daughter; Heart disease in her brother; Heart disease (age of onset: 28) in her father; Hypertension in her brother, daughter, and sister. There is no history of Heart attack, Stroke, Esophageal cancer, Rectal cancer, or Stomach cancer.  ROS:   Please see the history of present illness.     All other systems reviewed and are negative.   Labs/Other Tests and Data Reviewed:    Recent Labs: 08/05/2020: ALT 20; BUN 30; Creatinine, Ser 1.02; Potassium 3.8; Sodium 141   Recent Lipid Panel Lab Results  Component Value Date/Time   CHOL 113 08/05/2020 08:22 AM   TRIG 65.0 08/05/2020 08:22 AM   HDL 53.60 08/05/2020 08:22 AM   CHOLHDL 2 08/05/2020 08:22 AM   LDLCALC 47 08/05/2020 08:22 AM    Wt Readings  from Last 3 Encounters:  01/21/21 145 lb (  65.8 kg)  12/09/20 147 lb (66.7 kg)  11/18/20 147 lb (66.7 kg)     Objective:    Vital Signs:  Ht 5\' 2"  (1.575 m)   Wt 145 lb (65.8 kg)   BMI 26.52 kg/m    Well nourished, well developed female in no acute distress. Well appearing, alert and conversant, regular work of breathing,  good skin color  Eyes- anicteric mouth- oral mucosa is pink  neuro- grossly intact skin- no apparent rash or lesions or cyanosis   ASSESSMENT & PLAN:    1.  OSA -   The patient is tolerating PAP therapy well without any problems. The PAP download was reviewed today and showed an AHI of 3.4/hr on 14 cm H2O with 67% compliance in using more than 4 hours nightly.  The patient has been using and benefiting from PAP use and will continue to benefit from therapy.  -I have encouraged her to be more compliant with her device  2.  HTN -continue Toprol Xl 100mg  daily and chorthalidone 25mg  daily   COVID-19 Education: The signs and symptoms of COVID-19 were discussed with the patient and how to seek care for testing (follow up with PCP or arrange E-visit).  The importance of social distancing was discussed today.  Patient Risk:   After full review of this patient's clinical status, I feel that they are at least moderate risk at this time.  Time:   Today, I have spent 20 minutes on Telemedicine discussing medical problems including OSA, HTN, obesity and reviewing patient's chart including PAP compliance download from [facility] .  Medication Adjustments/Labs and Tests Ordered: Current medicines are reviewed at length with the patient today.  Concerns regarding medicines are outlined above.  Tests Ordered: No orders of the defined types were placed in this encounter.  Medication Changes: No orders of the defined types were placed in this encounter.   Disposition:  Follow up in 1 year(s)  Signed, Fransico Him, MD  01/21/2021 8:46 AM    [facility]

## 2021-01-25 ENCOUNTER — Other Ambulatory Visit: Payer: Self-pay | Admitting: Family Medicine

## 2021-01-25 NOTE — Telephone Encounter (Signed)
Last office visit 08/10/2020 for DM.  Last refilled 12/03/2020 for #60 with no refills.  CPE scheduled for 02/10/2021.

## 2021-01-31 ENCOUNTER — Telehealth: Payer: Self-pay | Admitting: Gastroenterology

## 2021-01-31 NOTE — Telephone Encounter (Signed)
She saw me in December about this. At baseline she had constipation and then episodes of what I thought was overflow, so put her on some Miralax. She should back off Miralax to perhaps once every other day at this point, and monitor. Would not take any more immodium unless she has persistent loose stools. She can titrate Miralax up or down based on how frequent she is going. Thanks

## 2021-01-31 NOTE — Telephone Encounter (Signed)
Spoke with patient, she states that about once or twice every two weeks she has diarrhea. Last night about an hour after she ate, she had 3 episodes of diarrhea towards the end she states that "it gets watery." She reported taking Imodium last night. She states that she has not had a  bowel movement this morning. Taking about a tablespoon of Miralax a day, she did take Miralax this morning. Patient is wanting to know about next steps. Dr. Havery Moros, please advise. Thanks.

## 2021-01-31 NOTE — Telephone Encounter (Signed)
Inbound call from patient requesting a call back please.  States she is still experiencing diarrhea and Miralax is not helping.  Please advise.

## 2021-01-31 NOTE — Telephone Encounter (Signed)
Spoke with patient in regards to Dr. Doyne Keel recommendations. Patient verbalized understanding and had no concerns at the end of the call.

## 2021-02-03 ENCOUNTER — Telehealth: Payer: Self-pay | Admitting: Family Medicine

## 2021-02-03 ENCOUNTER — Other Ambulatory Visit: Payer: Self-pay | Admitting: Family Medicine

## 2021-02-03 DIAGNOSIS — E1159 Type 2 diabetes mellitus with other circulatory complications: Secondary | ICD-10-CM

## 2021-02-03 NOTE — Telephone Encounter (Signed)
-----   Message from Ellamae Sia sent at 01/24/2021 10:05 AM EDT ----- Regarding: Lab orders for Tuesday, 3.29.22 Patient is scheduled for CPX labs, please order future labs, Thanks , Karna Christmas

## 2021-02-08 ENCOUNTER — Other Ambulatory Visit: Payer: Self-pay

## 2021-02-08 ENCOUNTER — Other Ambulatory Visit (INDEPENDENT_AMBULATORY_CARE_PROVIDER_SITE_OTHER): Payer: Medicare PPO

## 2021-02-08 ENCOUNTER — Ambulatory Visit (INDEPENDENT_AMBULATORY_CARE_PROVIDER_SITE_OTHER): Payer: Medicare PPO

## 2021-02-08 DIAGNOSIS — E1159 Type 2 diabetes mellitus with other circulatory complications: Secondary | ICD-10-CM | POA: Diagnosis not present

## 2021-02-08 DIAGNOSIS — Z0000 Encounter for general adult medical examination without abnormal findings: Secondary | ICD-10-CM | POA: Diagnosis not present

## 2021-02-08 LAB — COMPREHENSIVE METABOLIC PANEL
ALT: 19 U/L (ref 0–35)
AST: 19 U/L (ref 0–37)
Albumin: 4.1 g/dL (ref 3.5–5.2)
Alkaline Phosphatase: 59 U/L (ref 39–117)
BUN: 27 mg/dL — ABNORMAL HIGH (ref 6–23)
CO2: 32 mEq/L (ref 19–32)
Calcium: 9.7 mg/dL (ref 8.4–10.5)
Chloride: 103 mEq/L (ref 96–112)
Creatinine, Ser: 0.96 mg/dL (ref 0.40–1.20)
GFR: 56.52 mL/min — ABNORMAL LOW (ref >60.00)
Glucose, Bld: 100 mg/dL — ABNORMAL HIGH (ref 70–99)
Potassium: 4 mEq/L (ref 3.5–5.1)
Sodium: 141 mEq/L (ref 135–145)
Total Bilirubin: 0.3 mg/dL (ref 0.2–1.2)
Total Protein: 6.6 g/dL (ref 6.0–8.3)

## 2021-02-08 LAB — LIPID PANEL
Cholesterol: 128 mg/dL (ref 0–200)
HDL: 50.7 mg/dL (ref >39.00)
LDL Cholesterol: 59 mg/dL (ref 0–99)
NonHDL: 77.1
Total CHOL/HDL Ratio: 3
Triglycerides: 93 mg/dL (ref 0.0–149.0)
VLDL: 18.6 mg/dL (ref 0.0–40.0)

## 2021-02-08 LAB — MICROALBUMIN / CREATININE URINE RATIO
Creatinine,U: 97.6 mg/dL
Microalb Creat Ratio: 4 mg/g (ref 0.0–30.0)
Microalb, Ur: 3.9 mg/dL — ABNORMAL HIGH (ref 0.0–1.9)

## 2021-02-08 LAB — HEMOGLOBIN A1C: Hgb A1c MFr Bld: 6.5 % (ref 4.6–6.5)

## 2021-02-08 NOTE — Patient Instructions (Signed)
Morgan [name] , Thank you for taking time to come for your Medicare Wellness Visit. I appreciate your ongoing commitment to your health goals. Please review the following plan we discussed and let me know if I can assist you in the future.   Screening recommendations/referrals: Colonoscopy: Up to date, completed 08/31/2020, no longer required  Mammogram: due, will discuss with provider at physical  Bone Density: Up to date, completed 07/03/2019, due 06/2021 Recommended yearly ophthalmology/optometry visit for glaucoma screening and checkup Recommended yearly dental visit for hygiene and checkup  Vaccinations: Influenza vaccine: Up to date, completed 07/06/2020, due 06/2021 Pneumococcal vaccine: Completed series Tdap vaccine: Up to date, completed 06/25/2018, due 06/2028 Shingles vaccine: Completed series   Covid-19:Completed series  Advanced directives: Please bring a copy of your POA (Power of Dukedom) and/or Living Will to your next appointment.   Conditions/risks identified: diabetes, hypertension, hyperlipidemia   Next appointment: Follow up in one year for your annual wellness visit    Preventive Care 11 Years and Older, Female Preventive care refers to lifestyle choices and visits with your health care provider that can promote health and wellness. What does preventive care include?  A yearly physical exam. This is also called an annual well check.  Dental exams once or twice a year.  Routine eye exams. Ask your health care provider how often you should have your eyes checked.  Personal lifestyle choices, including:  Daily care of your teeth and gums.  Regular physical activity.  Eating a healthy diet.  Avoiding tobacco and drug use.  Limiting alcohol use.  Practicing safe sex.  Taking low-dose aspirin every day.  Taking vitamin and mineral supplements as recommended by your health care provider. What happens during an annual well check? The services and screenings  done by your health care provider during your annual well check will depend on your age, overall health, lifestyle risk factors, and family history of disease. Counseling  Your health care provider may ask you questions about your:  Alcohol use.  Tobacco use.  Drug use.  Emotional well-being.  Home and relationship well-being.  Sexual activity.  Eating habits.  History of falls.  Memory and ability to understand (cognition).  Work and work Statistician.  Reproductive health. Screening  You may have the following tests or measurements:  Height, weight, and BMI.  Blood pressure.  Lipid and cholesterol levels. These may be checked every 5 years, or more frequently if you are over 2 years old.  Skin check.  Lung cancer screening. You may have this screening every year starting at age 50 if you have a 30-pack-year history of smoking and currently smoke or have quit within the past 15 years.  Fecal occult blood test (FOBT) of the stool. You may have this test every year starting at age 79.  Flexible sigmoidoscopy or colonoscopy. You may have a sigmoidoscopy every 5 years or a colonoscopy every 10 years starting at age 75.  Hepatitis C blood test.  Hepatitis B blood test.  Sexually transmitted disease (STD) testing.  Diabetes screening. This is done by checking your blood sugar (glucose) after you have not eaten for a while (fasting). You may have this done every 1-3 years.  Bone density scan. This is done to screen for osteoporosis. You may have this done starting at age 1.  Mammogram. This may be done every 1-2 years. Talk to your health care provider about how often you should have regular mammograms. Talk with your health care provider about your  test results, treatment options, and if necessary, the need for more tests. Vaccines  Your health care provider may recommend certain vaccines, such as:  Influenza vaccine. This is recommended every year.  Tetanus,  diphtheria, and acellular pertussis (Tdap, Td) vaccine. You may need a Td booster every 10 years.  Zoster vaccine. You may need this after age 63.  Pneumococcal 13-valent conjugate (PCV13) vaccine. One dose is recommended after age 66.  Pneumococcal polysaccharide (PPSV23) vaccine. One dose is recommended after age 18. Talk to your health care provider about which screenings and vaccines you need and how often you need them. This information is not intended to replace advice given to you by your health care provider. Make sure you discuss any questions you have with your health care provider. Document Released: 11/26/2015 Document Revised: 07/19/2016 Document Reviewed: 08/31/2015 Elsevier Interactive Patient Education  2017 [place] Prevention in the Home Falls can cause injuries. They can happen to people of all ages. There are many things you can do to make your home safe and to help prevent falls. What can I do on the outside of my home?  Regularly fix the edges of walkways and driveways and fix any cracks.  Remove anything that might make you trip as you walk through a door, such as a raised step or threshold.  Trim any bushes or trees on the path to your home.  Use bright outdoor lighting.  Clear any walking paths of anything that might make someone trip, such as rocks or tools.  Regularly check to see if handrails are loose or broken. Make sure that both sides of any steps have handrails.  Any raised decks and porches should have guardrails on the edges.  Have any leaves, snow, or ice cleared regularly.  Use sand or salt on walking paths during winter.  Clean up any spills in your garage right away. This includes oil or grease spills. What can I do in the bathroom?  Use night lights.  Install grab bars by the toilet and in the tub and shower. Do not use towel bars as grab bars.  Use non-skid mats or decals in the tub or shower.  If you need to sit down in  the shower, use a plastic, non-slip stool.  Keep the floor dry. Clean up any water that spills on the floor as soon as it happens.  Remove soap buildup in the tub or shower regularly.  Attach bath mats securely with double-sided non-slip rug tape.  Do not have throw rugs and other things on the floor that can make you trip. What can I do in the bedroom?  Use night lights.  Make sure that you have a light by your bed that is easy to reach.  Do not use any sheets or blankets that are too big for your bed. They should not hang down onto the floor.  Have a firm chair that has side arms. You can use this for support while you get dressed.  Do not have throw rugs and other things on the floor that can make you trip. What can I do in the kitchen?  Clean up any spills right away.  Avoid walking on wet floors.  Keep items that you use a lot in easy-to-reach places.  If you need to reach something above you, use a strong step stool that has a grab bar.  Keep electrical cords out of the way.  Do not use floor polish or wax that  makes floors slippery. If you must use wax, use non-skid floor wax.  Do not have throw rugs and other things on the floor that can make you trip. What can I do with my stairs?  Do not leave any items on the stairs.  Make sure that there are handrails on both sides of the stairs and use them. Fix handrails that are broken or loose. Make sure that handrails are as long as the stairways.  Check any carpeting to make sure that it is firmly attached to the stairs. Fix any carpet that is loose or worn.  Avoid having throw rugs at the top or bottom of the stairs. If you do have throw rugs, attach them to the floor with carpet tape.  Make sure that you have a light switch at the top of the stairs and the bottom of the stairs. If you do not have them, ask someone to add them for you. What else can I do to help prevent falls?  Wear shoes that:  Do not have high  heels.  Have rubber bottoms.  Are comfortable and fit you well.  Are closed at the toe. Do not wear sandals.  If you use a stepladder:  Make sure that it is fully opened. Do not climb a closed stepladder.  Make sure that both sides of the stepladder are locked into place.  Ask someone to hold it for you, if possible.  Clearly mark and make sure that you can see:  Any grab bars or handrails.  First and last steps.  Where the edge of each step is.  Use tools that help you move around (mobility aids) if they are needed. These include:  Canes.  Walkers.  Scooters.  Crutches.  Turn on the lights when you go into a dark area. Replace any light bulbs as soon as they burn out.  Set up your furniture so you have a clear path. Avoid moving your furniture around.  If any of your floors are uneven, fix them.  If there are any pets around you, be aware of where they are.  Review your medicines with your doctor. Some medicines can make you feel dizzy. This can increase your chance of falling. Ask your doctor what other things that you can do to help prevent falls. This information is not intended to replace advice given to you by your health care provider. Make sure you discuss any questions you have with your health care provider. Document Released: 08/26/2009 Document Revised: 04/06/2016 Document Reviewed: 12/04/2014 Elsevier Interactive Patient Education  2017 [employer].

## 2021-02-08 NOTE — Progress Notes (Signed)
No critical labs need to be addressed urgently. We will discuss labs in detail at upcoming office visit.   

## 2021-02-08 NOTE — Progress Notes (Signed)
Subjective:   Morgan [name] is a 80 y.o. female who presents for Medicare Annual (Subsequent) preventive examination.  Review of Systems: N/A     I connected with the patient today by telephone and verified that I am speaking with the correct person using two identifiers. Location patient: home Location nurse: work Persons participating in the telephone visit: patient, nurse.   I discussed the limitations, risks, security and privacy concerns of performing an evaluation and management service by telephone and the availability of in person appointments. I also discussed with the patient that there may be a patient responsible charge related to this service. The patient expressed understanding and verbally consented to this telephonic visit.        Cardiac Risk Factors include: advanced age (>84men, >32 women);diabetes mellitus;hypertension;Other (see comment), Risk factor comments: hyperlipidemia     Objective:    Today's Vitals   02/08/21 0946  PainSc: 5    There is no height or weight on file to calculate BMI.  Advanced Directives 02/08/2021 01/21/2020 01/21/2020 01/19/2020 12/23/2018 09/30/2018 12/07/2017  Does Patient Have a Medical Advance Directive? Yes No No Yes Yes Yes Yes  Type of Paramedic of [place];Living will [place];Living will [place];Living will [place];Living will [place];Living will [place];Living will [place];Living will  Does patient want to make changes to medical advance directive? - - - - - - -  Copy of [place] in Chart? No - copy requested No - copy requested No - copy requested No - copy requested Yes - validated most recent copy scanned in chart (See row information) - No - copy requested  Would patient like information on creating a medical advance directive? - No - Patient declined - - - -  -  Pre-existing out of facility DNR order (yellow form or pink MOST form) - - - - - - -    Current Medications (verified) Outpatient Encounter Medications as of 02/08/2021  Medication Sig  . aspirin EC 81 MG tablet Take 1 tablet (81 mg total) by mouth daily.  [name] atorvastatin (LIPITOR) 80 MG tablet TAKE 1 TABLET BY MOUTH EVERY DAY  . BLINK TEARS 0.25 % SOLN Place 1 drop into both eyes 3 (three) times daily as needed (dry/irritated eyes.).  [name] calcium-vitamin D (OSCAL WITH D) 500-200 MG-UNIT tablet Take 1 tablet by mouth 2 (two) times daily.  [name] CARAFATE 1 GM/10ML suspension TAKE 10 MLS (1 G TOTAL) BY MOUTH EVERY 6 (SIX) HOURS AS NEEDED.  [name] cetirizine (ZYRTEC) 10 MG tablet Take 10 mg by mouth daily.   . chlorthalidone (HYGROTON) 25 MG tablet TAKE 1 TABLET BY MOUTH EVERY DAY  . CINNAMON PO Take 1,000 mg by mouth in the morning and at bedtime.  . clonazePAM (KLONOPIN) 1 MG tablet TAKE 2 TABLETS BY MOUTH AT BEDTIME  . gabapentin (NEURONTIN) 300 MG capsule TAKE 2 CAPSULES (600 MG TOTAL) BY MOUTH AT BEDTIME.  . Insulin Pen Needle (NOVOFINE) 32G X 6 MM MISC USE TO INJECT VICTOZA DAILY. DX: E11.9  . Lancet Device MISC Use to check blood sugar twice daily.  . Lancets (FREESTYLE) lancets Use to check blood sugar twice daily  . metFORMIN (GLUCOPHAGE-XR) 500 MG 24 hr tablet TAKE 2 TABLETS BY MOUTH TWICE A DAY  . metoprolol succinate (TOPROL-XL) 100 MG 24 hr tablet TAKE 1 TABLET (100 MG TOTAL) BY MOUTH DAILY. TAKE WITH OR  IMMEDIATELY FOLLOWING A MEAL.  . Multiple Vitamins-Minerals (PRESERVISION AREDS PO) Take 1 tablet by mouth 2 (two) times daily. Reported on 12/23/2015  . MYRBETRIQ 25 MG TB24 tablet TAKE 1 TABLET BY MOUTH EVERY DAY  . nitroGLYCERIN (NITROSTAT) 0.4 MG SL tablet PLACE 1 TABLET (0.4 MG TOTAL) UNDER THE TONGUE EVERY 5 (FIVE) MINUTES AS NEEDED FOR CHEST PAIN  . ONETOUCH ULTRA test strip USE TO CHECK BLOOD SUGAR TWICE DAILY  . polyethylene glycol (MIRALAX) 17 g packet Use as directed once to twice  daily  . Probiotic Product (ALIGN PO) Take by mouth.  . triamcinolone cream (KENALOG) 0.1 % Apply 1 application topically 2 (two) times daily.  [name] venlafaxine XR (EFFEXOR-XR) 75 MG 24 hr capsule TAKE 1 CAPSULE (75 MG TOTAL) BY MOUTH DAILY WITH BREAKFAST.  [name] VICTOZA 18 MG/3ML SOPN INJECT 1.2 MG UNDER THE SKIN ONCE DAILY  . simethicone (MYLICON) 40 QI/6.9GE drops Take 1.2 mLs (80 mg total) by mouth 4 (four) times daily as needed for flatulence.   No facility-administered encounter medications on file as of 02/08/2021.    Allergies (verified) Hydrocodone-acetaminophen, Percocet [oxycodone-acetaminophen], and Sulfa antibiotics   History: Past Medical History:  Diagnosis Date  . Angina   . Arthritis   . Atrial fibrillation (HCC)    ASPIRIN FOR BLOOD THINNER  . Atypical mole 09/22/2016  . Bulging discs    lumbar   . Complication of anesthesia    pt states has choking sensation with ET tube   . Coronary artery disease   . Depression   . Diabetes mellitus    diet controlled/on meds  . Family history of breast cancer   . Family history of colon cancer   . Fibromyalgia   . GERD (gastroesophageal reflux disease)   . H/O hiatal hernia   . Headache(784.0)    "recurring"  . High cholesterol   . History of colon polyps   . Hyperlipidemia   . Hypertension   . 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 Melanoma ([place])    melanoma  . Migraines    "til ~ 1980"  . PONV (postoperative nausea and vomiting)   . Restless leg syndrome   . Sciatic nerve pain    "from pinched nerve"  . Sleep apnea    uses CPAP  . Stroke [facility]) 2014   no deficits  . Weakness of right side of body    "I've had PT for it; they don't know what it's from".  CORTISONE INJECTION INTO BACK 08/30/12   Past Surgical History:  Procedure Laterality Date  . [place] STUDY N/A 12/29/2015   Procedure: [place] STUDY;  Surgeon: Manus Gunning, MD;  Location: [facility];  Service: Gastroenterology;  Laterality: N/A;  . CARPAL  TUNNEL RELEASE Left 07/02/2015   Procedure: CARPAL TUNNEL RELEASE;  Surgeon: Frederik Pear, MD;  Location: [place];  Service: Orthopedics;  Laterality: Left;  . CATARACT EXTRACTION, BILATERAL Bilateral    Oct and Nov 2017  . COLONOSCOPY  06/30/2019  . CORONARY ANGIOPLASTY WITH STENT PLACEMENT  06/2011   "1"  . CORONARY ARTERY BYPASS GRAFT  2005   CABG X 2  . DILATION AND CURETTAGE OF UTERUS     "more than once"  . ELBOW ARTHROSCOPY Left 07/02/2015   Procedure: ARTHROSCOPY LEFT ELBOW WITH DEBRIDEMENT AND REMOVAL LOOSE BODY;  Surgeon: Frederik Pear, MD;  Location: [place];  Service: Orthopedics;  Laterality: Left;  . ESOPHAGEAL DILATION  01/21/2020  Procedure: ESOPHAGEAL DILATION;  Surgeon: Lavena Bullion, DO;  [facility];  Service: Gastroenterology;;  . ESOPHAGEAL MANOMETRY N/A 12/29/2015   Procedure: ESOPHAGEAL MANOMETRY (EM);  Surgeon: Manus Gunning, MD;  [facility];  Service: Gastroenterology;  Laterality: N/A;  . ESOPHAGEAL MANOMETRY N/A 07/30/2019   Procedure: ESOPHAGEAL MANOMETRY (EM);  Surgeon: Yetta Flock, MD;  [facility];  Service: Gastroenterology;  Laterality: N/A;  . ESOPHAGOGASTRODUODENOSCOPY (EGD) WITH PROPOFOL N/A 01/21/2020   Procedure: ESOPHAGOGASTRODUODENOSCOPY (EGD) WITH PROPOFOL;  Surgeon: Lavena Bullion, DO;  [facility];  Service: Gastroenterology;  Laterality: N/A;  . FRACTURE SURGERY  ~ 2005   nose  . KNEE ARTHROSCOPY  09/04/2012   Procedure: ARTHROSCOPY KNEE;  Surgeon: Gearlean Alf, MD;  [facility];  Service: Orthopedics;  Laterality: Right;  right knee arthroscopy with medial and lateral meniscus debridement  . 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 MOUTH SURGERY  2004   "bone replacement; had cadavear bones put in; face was collapsing"  . NASAL SEPTUM SURGERY  ~ 1986  . TOTAL KNEE ARTHROPLASTY Right 07/07/2013   Procedure: RIGHT TOTAL  KNEE ARTHROPLASTY;  Surgeon: Gearlean Alf, MD;  [facility];  Service: Orthopedics;  Laterality: Right;  . TRANSORAL INCISIONLESS FUNDOPLICATION N/A 2/83/1517   Procedure: TRANSORAL INCISIONLESS FUNDOPLICATION;  Surgeon: Lavena Bullion, DO;  [facility];  Service: Gastroenterology;  Laterality: N/A;  . UPPER GASTROINTESTINAL ENDOSCOPY     Family History  Problem Relation Age of Onset  . Breast cancer Mother 31  . Heart disease Father 62       sudden onset due to CAD  . Hypertension Sister   . Dementia Sister   . Diabetes Sister   . Cancer - Colon Sister 49  . Colon cancer Sister   . GER disease Daughter   . Hypertension Daughter   . Heart disease Brother   . Hypertension Brother   . Heart attack Neg Hx   . Stroke Neg Hx   . Esophageal cancer Neg Hx   . Rectal cancer Neg Hx   . Stomach cancer Neg Hx    Social History   Socioeconomic History  . Marital status: Married    Spouse name: Not on file  . Number of children: 2  . Years of education: college  . Highest education level: Not on file  Occupational History  . Occupation: Retired   Tobacco Use  . Smoking status: Never Smoker  . Smokeless tobacco: Never Used  Vaping Use  . Vaping Use: Never used  Substance and Sexual Activity  . Alcohol use: Yes    Alcohol/week: 1.0 standard drink    Types: 1 Glasses of wine per week    Comment: "occasionally drink wine"  . Drug use: No  . Sexual activity: Yes  Other Topics Concern  . Not on file  Social History Narrative   Drinks 1 cup of coffee a day    Social Determinants of Health   Financial Resource Strain: Low Risk   . Difficulty of Paying Living Expenses: Not hard at all  Food Insecurity: No Food Insecurity  . Worried About Charity fundraiser in the Last Year: Never true  . Ran Out of Food in the Last Year: Never true  Transportation Needs: No Transportation Needs  . Lack of Transportation (Medical): No  . Lack of Transportation  (Non-Medical): No  Physical Activity: Inactive  . Days of Exercise per  Week: 0 days  . Minutes of Exercise per Session: 0 min  Stress: No Stress Concern Present  . Feeling of Stress : Not at all  Social Connections: Not on file    Tobacco Counseling Counseling given: Not Answered   Clinical Intake:  Pre-visit preparation completed: Yes  Pain : 0-10 Pain Score: 5  Pain Type: Acute pain Pain Location: Head Pain Descriptors / Indicators: Aching Pain Onset: 1 to 4 weeks ago Pain Frequency: Intermittent     Nutritional Risks: Nausea/ vomitting/ diarrhea (diarrhea sometimes) Diabetes: Yes CBG done?: No Did pt. bring in CBG monitor from home?: No  How often do you need to have someone help you when you read instructions, pamphlets, or other written materials from your doctor or pharmacy?: 1 - Never What is the last grade level you completed in school?: some college  Diabetic: Yes Nutrition Risk Assessment:  Has the patient had any N/V/D within the last 2 months?  Yes , diarrhea sometimes Does the patient have any non-healing wounds?  No  Has the patient had any unintentional weight loss or weight gain?  No   Diabetes:  Is the patient diabetic?  Yes  If diabetic, was a CBG obtained today?  No  telephone visit  Did the patient bring in their glucometer from home?  No  telephone visit  How often do you monitor your CBG's? Daily.   Financial Strains and Diabetes Management:  Are you having any financial strains with the device, your supplies or your medication? No .  Does the patient want to be seen by Chronic Care Management for management of their diabetes?  No  Would the patient like to be referred to a Nutritionist or for Diabetic Management?  No   Diabetic Exams:  Diabetic Eye Exam: Completed 10/13/2020 Diabetic Foot Exam: Completed 08/16/2020   Interpreter Needed?: No  Information entered by :: CJohnson, LPN   Activities of Daily Living In your present  state of health, do you have any difficulty performing the following activities: 02/08/2021  Hearing? N  Vision? N  Difficulty concentrating or making decisions? N  Walking or climbing stairs? N  Dressing or bathing? N  Doing errands, shopping? N  Preparing Food and eating ? N  Using the Toilet? N  In the past six months, have you accidently leaked urine? Y  Comment takes medication  Do you have problems with loss of bowel control? N  Managing your Medications? N  Managing your Finances? N  Housekeeping or managing your Housekeeping? N  Some recent data might be hidden    Patient Care Team: Jinny Sanders, MD as PCP - General (Family Medicine) Jerline Pain, MD as PCP - Cardiology (Cardiology) Sueanne Margarita, MD as PCP - Sleep Medicine (Sleep Medicine) Monna Fam, MD as Consulting Physician (Ophthalmology)  Indicate any recent Medical Services you may have received from other than [facility] providers in the past year (date may be approximate).     Assessment:   This is a routine wellness examination for Salli.  Hearing/Vision screen  Hearing Screening   125Hz  250Hz  500Hz  1000Hz  2000Hz  3000Hz  4000Hz  6000Hz  8000Hz   Right ear:           Left ear:           Vision Screening Comments: Patient gets annual eye exams  Dietary issues and exercise activities discussed: Current Exercise Habits: The patient does not participate in regular exercise at present, Exercise limited by: None identified  Goals    .  Increase physical activity     As scheduled, I will continue to attend physical therapy for 45 minutes 2 days to per week. Additionally, I will continue to do PT exercises for 30 minutes daily.     . Patient Stated     01/19/2020, I will try to increase the amount of exercise to about 2-3 days a week.     . Patient Stated     02/08/2021, I will maintain and continue medications as prescribed.       Depression Screen PHQ 2/9 Scores 02/08/2021 01/19/2020 12/23/2018 12/07/2017  11/14/2016 08/10/2015 07/23/2014  PHQ - 2 Score 4 1 0 1 0 1 0  PHQ- 9 Score 6 1 0 3 - - -    Fall Risk Fall Risk  02/08/2021 01/19/2020 12/23/2018 12/07/2017 11/14/2016  Falls in the past year? 0 0 1 No No  Number falls in past yr: 0 0 0 - -  Injury with Fall? 0 0 1 - -  Risk Factor Category  - - - - -  Risk for fall due to : Medication side effect Medication side effect - - -  Follow up Falls evaluation completed;Falls prevention discussed Falls evaluation completed;Falls prevention discussed - - -    FALL RISK PREVENTION PERTAINING TO THE HOME:  Any stairs in or around the home? Yes  If so, are there any without handrails? No  Home free of loose throw rugs in walkways, pet beds, electrical cords, etc? Yes  Adequate lighting in your home to reduce risk of falls? Yes   ASSISTIVE DEVICES UTILIZED TO PREVENT FALLS:  Life alert? No  Use of a cane, walker or w/c? No  Grab bars in the bathroom? Yes  Shower chair or bench in shower? No  Elevated toilet seat or a handicapped toilet? No   TIMED UP AND GO:  Was the test performed? N/A telephone visit .    Cognitive Function: MMSE - Mini Mental State Exam 02/08/2021 01/19/2020 12/23/2018 12/07/2017 11/14/2016  Orientation to time 5 5 5 5 5   Orientation to Place 5 5 5 5 5   Registration 3 3 3 3 3   Attention/ Calculation 5 5 0 0 0  Recall 3 3 3 3 3   Language- name 2 objects - - 0 0 0  Language- repeat 1 1 1 1 1   Language- follow 3 step command - - 3 3 3   Language- read & follow direction - - 0 0 0  Write a sentence - - 0 0 0  Copy design - - 0 0 0  Total score - - 20 20 20   Mini Cog  Mini-Cog screen was completed. Maximum score is 22. A value of 0 denotes this part of the MMSE was not completed or the patient failed this part of the Mini-Cog screening.       Immunizations Immunization History  Administered Date(s) Administered  . Fluad Quad(high Dose 65+) 07/17/2019  . Influenza, High Dose Seasonal PF 08/04/2016, 08/21/2017, 08/07/2018,  07/06/2020  . Influenza,inj,Quad PF,6+ Mos 07/23/2014, 08/10/2015  . PFIZER(Purple Top)SARS-COV-2 Vaccination 12/05/2019, 12/25/2019, 07/06/2020  . Pneumococcal Conjugate-13 10/27/2014  . Pneumococcal Polysaccharide-23 09/02/2007, 10/03/2013  . Td 11/14/2007  . Tdap 06/25/2018  . Zoster 02/09/2010  . Zoster Recombinat (Shingrix) 03/05/2018, 03/17/2020    TDAP status: Up to date  Flu Vaccine status: Up to date  Pneumococcal vaccine status: Up to date  Covid-19 vaccine status: Completed vaccines  Qualifies for Shingles Vaccine? Yes   Zostavax completed Yes  Shingrix Completed?: Yes  Screening Tests Health Maintenance  Topic Date Due  . COVID-19 Vaccine (4 - Booster for Pfizer series) 01/06/2021  . URINE MICROALBUMIN  01/18/2021  . HEMOGLOBIN A1C  02/02/2021  . MAMMOGRAM  07/02/2021  . FOOT EXAM  08/16/2021  . COLONOSCOPY (Pts 45-29yrs Insurance coverage will need to be confirmed)  08/31/2021  . OPHTHALMOLOGY EXAM  10/13/2021  . TETANUS/TDAP  06/25/2028  . INFLUENZA VACCINE  Completed  . DEXA SCAN  Completed  . Hepatitis C Screening  Completed  . PNA vac Low Risk Adult  Completed  . HPV VACCINES  Aged Out    Health Maintenance  Health Maintenance Due  Topic Date Due  . COVID-19 Vaccine (4 - Booster for Pfizer series) 01/06/2021  . URINE MICROALBUMIN  01/18/2021  . HEMOGLOBIN A1C  02/02/2021    Colorectal cancer screening: colonoscopy completed 08/31/2020, repeat 1 year   Mammogram status: due, will discuss having this set up with provider at her physical   Bone Density status: Completed 07/03/2019. Results reflect: Bone density results: OSTEOPENIA. Repeat every 2 years.  Lung Cancer Screening: (Low Dose CT Chest recommended if Age 58-80 years, 30 pack-year currently smoking OR have quit w/in 15 years.) does not qualify.   Additional Screening:  Hepatitis C Screening: does not qualify; Completed N/A  Vision Screening: Recommended annual ophthalmology exams  for early detection of glaucoma and other disorders of the eye. Is the patient up to date with their annual eye exam?  Yes  Who is the provider or what is the name of the office in which the patient attends annual eye exams? Dr. Herbert Deaner If pt is not established with a provider, would they like to be referred to a provider to establish care? No .   Dental Screening: Recommended annual dental exams for proper oral hygiene  Community Resource Referral / Chronic Care Management: CRR required this visit?  No   CCM required this visit?  No      Plan:     I have personally reviewed and noted the following in the patient's chart:   . Medical and social history . Use of alcohol, tobacco or illicit drugs  . Current medications and supplements . Functional ability and status . Nutritional status . Physical activity . Advanced directives . List of other physicians . Hospitalizations, surgeries, and ER visits in previous 12 months . Vitals . Screenings to include cognitive, depression, and falls . Referrals and appointments  In addition, I have reviewed and discussed with patient certain preventive protocols, quality metrics, and best practice recommendations. A written personalized care plan for preventive services as well as general preventive health recommendations were provided to patient.   Due to this being a telephonic visit, the after visit summary with patients personalized plan was offered to patient via office or my-chart. Patient preferred to pick up at office at next visit or via mychart.   Andrez Grime, LPN   [id number]

## 2021-02-08 NOTE — Progress Notes (Signed)
PCP notes:  Health Maintenance: Mammogram- due   Abnormal Screenings: none   Patient concerns: Discuss ongoing fatigue   Nurse concerns: none   Next PCP appt.: 02/10/2021 @ 11:40 am

## 2021-02-10 ENCOUNTER — Other Ambulatory Visit: Payer: Self-pay

## 2021-02-10 ENCOUNTER — Ambulatory Visit (INDEPENDENT_AMBULATORY_CARE_PROVIDER_SITE_OTHER): Payer: Medicare PPO | Admitting: Family Medicine

## 2021-02-10 VITALS — BP 122/70 | HR 74 | Temp 97.2°F | Ht 62.0 in | Wt 150.8 lb

## 2021-02-10 DIAGNOSIS — Z8673 Personal history of transient ischemic attack (TIA), and cerebral infarction without residual deficits: Secondary | ICD-10-CM | POA: Diagnosis not present

## 2021-02-10 DIAGNOSIS — E785 Hyperlipidemia, unspecified: Secondary | ICD-10-CM

## 2021-02-10 DIAGNOSIS — I480 Paroxysmal atrial fibrillation: Secondary | ICD-10-CM

## 2021-02-10 DIAGNOSIS — C434 Malignant melanoma of scalp and neck: Secondary | ICD-10-CM

## 2021-02-10 DIAGNOSIS — G4733 Obstructive sleep apnea (adult) (pediatric): Secondary | ICD-10-CM

## 2021-02-10 DIAGNOSIS — F331 Major depressive disorder, recurrent, moderate: Secondary | ICD-10-CM

## 2021-02-10 DIAGNOSIS — Z0000 Encounter for general adult medical examination without abnormal findings: Secondary | ICD-10-CM | POA: Diagnosis not present

## 2021-02-10 DIAGNOSIS — S060X0A Concussion without loss of consciousness, initial encounter: Secondary | ICD-10-CM

## 2021-02-10 DIAGNOSIS — E1169 Type 2 diabetes mellitus with other specified complication: Secondary | ICD-10-CM | POA: Diagnosis not present

## 2021-02-10 DIAGNOSIS — I152 Hypertension secondary to endocrine disorders: Secondary | ICD-10-CM

## 2021-02-10 DIAGNOSIS — E1159 Type 2 diabetes mellitus with other circulatory complications: Secondary | ICD-10-CM

## 2021-02-10 NOTE — Assessment & Plan Note (Signed)
Mild  Resolving.. no further headache, nml neuro exam.  Return precautions given.

## 2021-02-10 NOTE — Assessment & Plan Note (Signed)
Stable, chronic.  Continue current medication.    Chlorthalidone 25 mg daily.

## 2021-02-10 NOTE — Assessment & Plan Note (Signed)
Stable on CPAP 

## 2021-02-10 NOTE — Assessment & Plan Note (Signed)
Followed q 6 months by Derm.

## 2021-02-10 NOTE — Assessment & Plan Note (Addendum)
Continue current dose 75 mg effexor.. refer to counseling.  If not better in 4-6 weeks.. make follow up for reassessment and med change.

## 2021-02-10 NOTE — Progress Notes (Signed)
Patient ID: Morgan [name], female    DOB: [birth year], 80 y.o.   MRN: [id number]  This visit was conducted in person.  BP 122/70   Pulse 74   Temp (!) 97.2 F (36.2 C) (Temporal)   Ht 5\' 2"  (1.575 m)   Wt 150 lb 12 oz (68.4 kg)   SpO2 98%   BMI 27.57 kg/m    CC:  Chief Complaint  Patient presents with  . Annual Exam    AMW PART 2     Subjective:   HPI: The patient presents for  complete physical and review of chronic health problems. He/She also has the following acute concerns today:  Teapot fell off shelf and hit left forehead  4 days ago... after that she had headache for 3 days.  Now resolved.  GERD: Using pepto bismol helps temporarily. Seeing GI for chronic diarrhea... felt likely constipation with stool leakage around.  I have personally reviewed the Medicare Annual Wellness questionnaire and have noted 1. The patient's medical and social history 2. Their use of alcohol, tobacco or illicit drugs 3. Their current medications and supplements 4. The patient's functional ability including ADL's, fall risks, home safety risks and hearing or visual             impairment. 5. Diet and physical activities 6. Evidence for depression or mood disorders 7.         Updated provider list Cognitive evaluation was performed and recorded on pt medicare questionnaire form. The patients weight, height, BMI and visual acuity have been recorded in the chart  I have made referrals, counseling and provided education to the patient based review of the above and I have provided the pt with a written personalized care plan for preventive services.   Documentation of this information was scanned into the electronic record under the media tab.   Advance directives and end of life planning reviewed in detail with patient and documented in EMR. Patient given handout on advance care directives if needed. HCPOA and living will updated if needed.   Hearing Screening   125Hz  250Hz  500Hz   1000Hz  2000Hz  3000Hz  4000Hz  6000Hz  8000Hz   Right ear:  0 0 20 20  20     Left ear:  20 20 20 20  20     Vision Screening Comments: Last eye exam in December 2021 at [facility]   [place] Clinical Support from 02/08/2021 in [facility] [place]  PHQ-2 Total Score 4      Fall Risk  02/08/2021 01/19/2020 12/23/2018 12/07/2017 11/14/2016  Falls in the past year? 0 0 1 No No  Number falls in past yr: 0 0 0 - -  Injury with Fall? 0 0 1 - -  Risk Factor Category  - - - - -  Risk for fall due to : Medication side effect Medication side effect - - -  Follow up Falls evaluation completed;Falls prevention discussed Falls evaluation completed;Falls prevention discussed - - -   Hypertension:   Good control on chlorthalidone BP Readings from Last 3 Encounters:  02/10/21 122/70  11/18/20 (!) 150/90  10/13/20 (!) 166/76  Using medication without problems or lightheadedness: none Chest pain with exertion: none Edema:none Short of breath:none Average home BPs: Other issues:  Elevated Cholesterol: goal < 70 with history of CAD and TIA... at goal with atorvastatin 80 mg daily. Lab Results  Component Value Date   CHOL 128 02/08/2021   HDL 50.70 02/08/2021   LDLCALC 59  02/08/2021   TRIG 93.0 02/08/2021   CHOLHDL 3 02/08/2021   Using medications without problems: Muscle aches:  Diet compliance: Exercise: Other complaints:  Diabetes:  Good control on metformin and victoza.[name] good control. Lab Results  Component Value Date   HGBA1C 6.5 02/08/2021  Using medications without difficulties: Hypoglycemic episodes: Hyperglycemic episodes: Feet problems: no ulcers Blood Sugars averaging: eye exam within last year: yes.. has diabetic exam in 5.2022   Malignant melanoma: Seeing Derm every 6 months.   MDD  Well controlled on effexor... she has been under a lot of stress, having trouble dealing with her husbands cancer diagnosis.   Using clonazepam 1 tab at bedtime... # 60 at a  time. PDMP reviewed during this encounter. Flowsheet Row Clinical Support from 02/08/2021 in [facility] at [place]  PHQ-9 Total Score 6          Relevant past medical, surgical, family and social history reviewed and updated as indicated. Interim medical history since our last visit reviewed. Allergies and medications reviewed and updated. Outpatient Medications Prior to Visit  Medication Sig Dispense Refill  . aspirin EC 81 MG tablet Take 1 tablet (81 mg total) by mouth daily.    [name] atorvastatin (LIPITOR) 80 MG tablet TAKE 1 TABLET BY MOUTH EVERY DAY 90 tablet 1  . BLINK TEARS 0.25 % SOLN Place 1 drop into both eyes 3 (three) times daily as needed (dry/irritated eyes.).    [name] calcium-vitamin D (OSCAL WITH D) 500-200 MG-UNIT tablet Take 1 tablet by mouth 2 (two) times daily.    [name] CARAFATE 1 GM/10ML suspension TAKE 10 MLS (1 G TOTAL) BY MOUTH EVERY 6 (SIX) HOURS AS NEEDED. 420 mL 3  . cetirizine (ZYRTEC) 10 MG tablet Take 10 mg by mouth daily.     . chlorthalidone (HYGROTON) 25 MG tablet TAKE 1 TABLET BY MOUTH EVERY DAY 90 tablet 3  . CINNAMON PO Take 1,000 mg by mouth in the morning and at bedtime.    . clonazePAM (KLONOPIN) 1 MG tablet TAKE 2 TABLETS BY MOUTH AT BEDTIME 60 tablet 0  . gabapentin (NEURONTIN) 300 MG capsule TAKE 2 CAPSULES (600 MG TOTAL) BY MOUTH AT BEDTIME. 180 capsule 1  . Insulin Pen Needle (NOVOFINE) 32G X 6 MM MISC USE TO INJECT VICTOZA DAILY. DX: E11.9 100 each 3  . Lancet Device MISC Use to check blood sugar twice daily. 1 each 0  . Lancets (FREESTYLE) lancets Use to check blood sugar twice daily 100 each 11  . metFORMIN (GLUCOPHAGE-XR) 500 MG 24 hr tablet TAKE 2 TABLETS BY MOUTH TWICE A DAY 360 tablet 1  . metoprolol succinate (TOPROL-XL) 100 MG 24 hr tablet TAKE 1 TABLET (100 MG TOTAL) BY MOUTH DAILY. TAKE WITH OR IMMEDIATELY FOLLOWING A MEAL. 90 tablet 3  . Multiple Vitamins-Minerals (PRESERVISION AREDS PO) Take 1 tablet by mouth 2 (two) times daily.  Reported on 12/23/2015    . MYRBETRIQ 25 MG TB24 tablet TAKE 1 TABLET BY MOUTH EVERY DAY 90 tablet 3  . nitroGLYCERIN (NITROSTAT) 0.4 MG SL tablet PLACE 1 TABLET (0.4 MG TOTAL) UNDER THE TONGUE EVERY 5 (FIVE) MINUTES AS NEEDED FOR CHEST PAIN 25 tablet 0  . ONETOUCH ULTRA test strip USE TO CHECK BLOOD SUGAR TWICE DAILY 150 strip 3  . polyethylene glycol (MIRALAX) 17 g packet Use as directed once to twice daily (Patient taking differently: Take 17 g by mouth every other day. Use as directed once to twice daily) 14 each 0  .  Probiotic Product (ALIGN PO) Take by mouth.    . triamcinolone cream (KENALOG) 0.1 % Apply 1 application topically 2 (two) times daily. 30 g 0  . venlafaxine XR (EFFEXOR-XR) 75 MG 24 hr capsule TAKE 1 CAPSULE (75 MG TOTAL) BY MOUTH DAILY WITH BREAKFAST. 90 capsule 1  . VICTOZA 18 MG/3ML SOPN INJECT 1.2 MG UNDER THE SKIN ONCE DAILY 18 mL 5  . simethicone (MYLICON) 40 MB/5.5HR drops Take 1.2 mLs (80 mg total) by mouth 4 (four) times daily as needed for flatulence. 30 mL 0   No facility-administered medications prior to visit.     Per HPI unless specifically indicated in ROS section below Review of Systems  Constitutional: Negative for fatigue and fever.  HENT: Negative for congestion.   Eyes: Negative for pain.  Respiratory: Negative for cough and shortness of breath.   Cardiovascular: Negative for chest pain, palpitations and leg swelling.  Gastrointestinal: Negative for abdominal pain.       Reflux  Genitourinary: Negative for dysuria and vaginal bleeding.  Musculoskeletal: Negative for back pain.  Neurological: Positive for headaches. Negative for syncope and light-headedness.  Psychiatric/Behavioral: Negative for dysphoric mood.   Objective:  BP 122/70   Pulse 74   Temp (!) 97.2 F (36.2 C) (Temporal)   Ht 5\' 2"  (1.575 m)   Wt 150 lb 12 oz (68.4 kg)   SpO2 98%   BMI 27.57 kg/m   Wt Readings from Last 3 Encounters:  02/10/21 150 lb 12 oz (68.4 kg)  01/21/21  145 lb (65.8 kg)  12/09/20 147 lb (66.7 kg)      Physical Exam Constitutional:      General: She is not in acute distress.    Appearance: Normal appearance. She is well-developed. She is not ill-appearing or toxic-appearing.  HENT:     Head: Normocephalic.     Right Ear: Hearing, tympanic membrane, ear canal and external ear normal. Tympanic membrane is not erythematous, retracted or bulging.     Left Ear: Hearing, tympanic membrane, ear canal and external ear normal. Tympanic membrane is not erythematous, retracted or bulging.     Nose: No mucosal edema or rhinorrhea.     Right Sinus: No maxillary sinus tenderness or frontal sinus tenderness.     Left Sinus: No maxillary sinus tenderness or frontal sinus tenderness.     Mouth/Throat:     Pharynx: Uvula midline.  Eyes:     General: Lids are normal. Lids are everted, no foreign bodies appreciated.     Conjunctiva/sclera: Conjunctivae normal.     Pupils: Pupils are equal, round, and reactive to light.  Neck:     Thyroid: No thyroid mass or thyromegaly.     Vascular: No carotid bruit.     Trachea: Trachea normal.  Cardiovascular:     Rate and Rhythm: Normal rate and regular rhythm.     Pulses: Normal pulses.     Heart sounds: Normal heart sounds, S1 normal and S2 normal. No murmur heard. No friction rub. No gallop.   Pulmonary:     Effort: Pulmonary effort is normal. No tachypnea or respiratory distress.     Breath sounds: Normal breath sounds. No decreased breath sounds, wheezing, rhonchi or rales.  Abdominal:     General: Bowel sounds are normal.     Palpations: Abdomen is soft.     Tenderness: There is no abdominal tenderness.  Musculoskeletal:     Cervical back: Normal range of motion and neck supple.  Skin:  General: Skin is warm and dry.     Findings: No rash.  Neurological:     Mental Status: She is alert and oriented to person, place, and time.     GCS: GCS eye subscore is 4. GCS verbal subscore is 5. GCS motor  subscore is 6.     Cranial Nerves: No cranial nerve deficit.     Sensory: No sensory deficit.     Motor: No abnormal muscle tone.     Coordination: Coordination normal.     Gait: Gait normal.     Deep Tendon Reflexes: Reflexes are normal and symmetric.     Comments: Nml cerebellar exam   No papilledema  Psychiatric:        Mood and Affect: Mood is not anxious or depressed.        Speech: Speech normal.        Behavior: Behavior normal. Behavior is cooperative.        Thought Content: Thought content normal.        Cognition and Memory: Memory is not impaired. She does not exhibit impaired recent memory or impaired remote memory.        Judgment: Judgment normal.       Diabetic foot exam: Normal inspection No skin breakdown No calluses  Normal DP pulses Normal sensation to light touch and monofilament Nails normal  Results for orders placed or performed in visit on 02/08/21  Microalbumin / creatinine urine ratio  Result Value Ref Range   Microalb, Ur 3.9 (H) 0.0 - 1.9 mg/dL   Creatinine,U 97.6 mg/dL   Microalb Creat Ratio 4.0 0.0 - 30.0 mg/g  Comprehensive metabolic panel  Result Value Ref Range   Sodium 141 135 - 145 mEq/L   Potassium 4.0 3.5 - 5.1 mEq/L   Chloride 103 96 - 112 mEq/L   CO2 32 19 - 32 mEq/L   Glucose, Bld 100 (H) 70 - 99 mg/dL   BUN 27 (H) 6 - 23 mg/dL   Creatinine, Ser 0.96 0.40 - 1.20 mg/dL   Total Bilirubin 0.3 0.2 - 1.2 mg/dL   Alkaline Phosphatase 59 39 - 117 U/L   AST 19 0 - 37 U/L   ALT 19 0 - 35 U/L   Total Protein 6.6 6.0 - 8.3 g/dL   Albumin 4.1 3.5 - 5.2 g/dL   GFR 56.52 (L) >60.00 mL/min   Calcium 9.7 8.4 - 10.5 mg/dL  Lipid panel  Result Value Ref Range   Cholesterol 128 0 - 200 mg/dL   Triglycerides 93.0 0.0 - 149.0 mg/dL   HDL 50.70 >39.00 mg/dL   VLDL 18.6 0.0 - 40.0 mg/dL   LDL Cholesterol 59 0 - 99 mg/dL   Total CHOL/HDL Ratio 3    NonHDL 77.10   Hemoglobin A1c  Result Value Ref Range   Hgb A1c MFr Bld 6.5 4.6 - 6.5 %     This visit occurred during the SARS-CoV-2 public health emergency.  Safety protocols were in place, including screening questions prior to the visit, additional usage of staff PPE, and extensive cleaning of exam room while observing appropriate contact time as indicated for disinfecting solutions.   COVID 19 screen:  No recent travel or known exposure to COVID19 The patient denies respiratory symptoms of COVID 19 at this time. The importance of social distancing was discussed today.   Assessment and Plan   The patient's preventative maintenance and recommended screening tests for an annual wellness exam were reviewed in full today. Brought  up to date unless services declined.  Counselled on the importance of diet, exercise, and its role in overall health and mortality. The patient's FH and SH was reviewed, including their home life, tobacco status, and drug and alcohol status.   The patient saw a LPN or RN for medicare wellness visit.  Prevention and wellness was reviewed in detail. Note reviewed and important notes copied below. Health Maintenance: Mammogram- due Abnormal Screenings: none  Vaccines:uptodate, COVID x 3, plans 4th booster. Pap/DVE:No pap, DVEnot indicated... Asymptomatic. No family history of uterine or ovarian cancer. Mammo:06/2019, no yearly breast exam.Mother with breast cancer... repeat due Bone Density:06/2019 stable osteopenia, repeat 5 years Colon:08/2020 Sister with strong family history colon cancer, plan repeat 1 years Smoking Status:None Problem List Items Addressed This Visit    Concussion with no loss of consciousness    Mild  Resolving.. no further headache, nml neuro exam.  Return precautions given.       History of TIA (transient ischemic attack) (Chronic)    On baby aspirin.      Hyperlipidemia associated with type 2 diabetes mellitus (HCC) (Chronic)   Hypertension associated with diabetes ([place]) (Chronic)    Stable, chronic.   Continue current medication.    Chlorthalidone 25 mg daily.      Malignant melanoma (HCC) (Chronic)    Followed q 6 months by Derm.      MDD (major depressive disorder), recurrent episode, moderate (HCC) (Chronic)     Continue current dose 75 mg effexor.. refer to counseling.  If not better in 4-6 weeks.. make follow up for reassessment and med change.      Relevant Orders   Ambulatory referral to Psychology   Obstructive sleep apnea (Chronic)    Stable on CPAP.      Paroxysmal A-fib (HCC) (Chronic)    Followed by Cardiology.       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  Continue current medication.   Victoza 1.2 mg daily  Metfromin XR 500 mg 2 tabs BID       Other Visit Diagnoses    Routine general medical examination at a health care facility    -  Primary     Orders Placed This Encounter  Procedures  . Ambulatory referral to Psychology    Referral Priority:   Routine    Referral Type:   Psychiatric    Referral Reason:   Specialty Services Required    Requested Specialty:   Psychology    Number of Visits Requested:   1  . [facility] DIABETES FOOT EXAM    This external order was created through the Results Console.    Eliezer Lofts, MD

## 2021-02-10 NOTE — Assessment & Plan Note (Signed)
Followed by Cardiology 

## 2021-02-10 NOTE — Assessment & Plan Note (Signed)
Stable, chronic.  Continue current medication.   Victoza 1.2 mg daily  Metfromin XR 500 mg 2 tabs BID

## 2021-02-10 NOTE — Patient Instructions (Addendum)
Can try Pepcid AC twice daily.  Call to schedule mammogram. Continue effexor.. refer to counseling.  If not better in 4-6 weeks.. make follow up for reassessment and med change.  mild concussion, resolving.. return precautions given.

## 2021-02-10 NOTE — Assessment & Plan Note (Signed)
On baby aspirin.

## 2021-02-14 NOTE — Telephone Encounter (Signed)
Pt is requesting a call back from a nurse to advise that she is still experiencing the same diarrhea symptoms as before.

## 2021-02-14 NOTE — Telephone Encounter (Signed)
Spoke with patient, she states that she had another episode of diarrhea about 3 hours ago. She states that she has not been taking the Imodium, advised patient to begin that as needed. She states that she is also taking about a teaspoon of Miralax every other day, advised patient to hold Miralax for a couple of days and see how she does. Advised patient to call back if she is still having loose stools after a few days. Patient verbalized understanding and had no concerns at the end of the call.

## 2021-02-27 ENCOUNTER — Other Ambulatory Visit: Payer: Self-pay | Admitting: Family Medicine

## 2021-03-03 DIAGNOSIS — G4733 Obstructive sleep apnea (adult) (pediatric): Secondary | ICD-10-CM | POA: Diagnosis not present

## 2021-03-12 ENCOUNTER — Other Ambulatory Visit: Payer: Self-pay | Admitting: Family Medicine

## 2021-03-14 ENCOUNTER — Ambulatory Visit: Payer: Medicare PPO | Admitting: Psychology

## 2021-03-15 ENCOUNTER — Other Ambulatory Visit: Payer: Self-pay | Admitting: Family Medicine

## 2021-03-17 NOTE — Telephone Encounter (Signed)
Lm on vm for patient to return call 

## 2021-03-17 NOTE — Telephone Encounter (Signed)
Patient called in with the same symptoms. States it have been having explosive diarrhea for about a week. She is currently taking miralax (every other day) and pepcid (everyday). Takes immodium (2-3) after she have an explosion to try to stop it. Patient requests a call back at [phone]

## 2021-03-17 NOTE — Telephone Encounter (Signed)
Okay I will see her in clinic. She has tried fiber previously and it did not help. Would recommend avoiding taking immodium after she has taken Miralax, this is confounding this. Would use Miralax PRN at this point and see how she does, will see her in the office to discuss

## 2021-03-17 NOTE — Telephone Encounter (Signed)
Spoke with patient, she states that she has been having issues with her bowel movements every other week for 3-4 months. She reports that she has one bout of diarrhea about once a week after dinner. She states that she will have about 3-4 episodes of diarrhea at that time. She states that she usually has bowel movements that are normal. No fever. She takes Imodium as needed (1-2 tablets) when she is having an episode of diarrhea. She is still taking about a teaspoon or more of Miralax every other day along with OTC Pepcid. Reports bloating and lower abdominal cramping that gets better a little while after having a bowel movement. No blood. Advised patient that this seems like a recurrent issue and it would be best for her to come in for re-evaluation. Patient offered a sooner appt with APP but prefers to see Dr. Havery Moros. Patient has been scheduled for a follow up on Thursday, 03/31/21 at 3:20 PM. Advised that I will check with Dr. Havery Moros for any further recommendations prior to her appt. Please advise, thanks.

## 2021-03-21 NOTE — Telephone Encounter (Signed)
Spoke with patient in regards to recommendations. Patient verbalized understanding and had no concerns at the end of the call.  

## 2021-03-25 ENCOUNTER — Other Ambulatory Visit: Payer: Self-pay | Admitting: Family Medicine

## 2021-03-25 NOTE — Telephone Encounter (Signed)
Last office visit 02/10/2021 for CPE.  Last refilled 01/25/2021 for #60 with no refills.  Next Appt: 08/16/2021 for 6 month DM check.

## 2021-03-30 ENCOUNTER — Ambulatory Visit: Payer: Medicare PPO | Admitting: Psychology

## 2021-03-31 ENCOUNTER — Other Ambulatory Visit: Payer: Medicare PPO

## 2021-03-31 ENCOUNTER — Encounter: Payer: Self-pay | Admitting: Gastroenterology

## 2021-03-31 ENCOUNTER — Ambulatory Visit: Payer: Medicare PPO | Admitting: Gastroenterology

## 2021-03-31 VITALS — BP 122/70 | HR 73 | Wt 147.6 lb

## 2021-03-31 DIAGNOSIS — R194 Change in bowel habit: Secondary | ICD-10-CM | POA: Diagnosis not present

## 2021-03-31 MED ORDER — CITRUCEL PO POWD: ORAL | Status: AC

## 2021-03-31 NOTE — Patient Instructions (Signed)
If you are age 79 or older, your body mass index should be between 23-30. Your Body mass index is 27 kg/m. If this is out of the aforementioned range listed, please consider follow up with your Primary Care Provider.  If you are age 62 or younger, your body mass index should be between 19-25. Your Body mass index is 27 kg/m. If this is out of the aformentioned range listed, please consider follow up with your Primary Care Provider.   Please go to the lab in the basement of our building to have lab work done as you leave today. Hit "B" for basement when you get on the elevator.  When the doors open the lab is on your left.  We will call you with the results. Thank you.  Due to recent changes in healthcare laws, you may see the results of your imaging and laboratory studies on MyChart before your provider has had a chance to review them.  We understand that in some cases there may be results that are confusing or concerning to you. Not all laboratory results come back in the same time frame and the provider may be waiting for multiple results in order to interpret others.  Please give [place] 48 hours in order for your provider to thoroughly review all the results before contacting the office for clarification of your results.   We are giving you a low FOD-MAP diet to follow.  Discontinue Miralax.  Please purchase the following medications over the counter and take as directed: Citrucel - take as directed once daily  Use can use Imodium as needed.    Thank you for entrusting me with your care and for choosing [facility], Dr. [place] Cellar

## 2021-03-31 NOTE — Progress Notes (Signed)
HPI :  80 year old female here for follow-up visit regarding altered bowel habits. Recall she has a history of refractory GERD despite multiple PPIs which led to TIF procedure with Dr. Melanee Left last year.   I last saw her in December.  She was having explosive loose stools about once or twice per week and more so on a daily basis she was having constipation with hard stools and having a hard time getting the stool out.  We placed her on a trial of MiraLAX as I was concerned she may have a component of overflow incontinence in light of her constipation.  She states the MiraLAX did not help and she continues to have explosive loose stools at times.  She states in general this is actually been going on for about 3 to 4 years.  She had a colonoscopy with me last October which showed no evidence of inflammation, biopsies negative for microscopic colitis.  She states at baseline she will have a bowel movement every day and has regular formed, however usually at nighttime, 2 nights per week she will have multiple episodes of explosive diarrhea.  When she has that she takes Imodium which can help.  Again she denies any constipation overall at this point.  She has tried Metamucil historically but cannot recall that it helped her much.  Of note she has been on Vick toes the past 3 to 4 years which correlates with the timeframe of her symptoms.  He has been on metformin for several years and states she has tolerated this since she has been on it.  She denies any other new medication changes.  No blood in her stool  Prior evaluation: -LastEGD:06/2019 -Barium esophagram:04/2018: Tiny HH, otherwise normal. Normal motility -Esophageal Manometry:?Jackhammer esophagus with hypertensive LES, normal IRP, no achalasia in 2017. Repeat a.m. this month was normal -pH/Impedance:2017. DeMeester score 40 with majority of reflux episodes being acidic. SI 75%  Endoscopic history: -EGD (12/2015): Small HH,  numerous fundic gland polyps -EGD (06/2019, Dr. Havery Moros): 2 cm HH, Hill Grade 2 valve, fundic gland polyps - EGD with TIF (01/21/2020, Dr. Bryan Lemma): 24 fasteners placed with 270 degree valve, 3 cm in length.  Colonoscopy 08/31/20 - High risk colon cancer surveillance: Personal history of colonic polyps (14 polyps removed one year ago), sister with colon cancer dx age 32, negative genetic testing, also with worsening of chronic diarrhea recently The perianal and digital rectal examinations were normal. - Multiple small-mouthed diverticula were found in the entire colon. - Five sessile polyps were found in the transverse colon. The polyps were 2 to 3 mm in size. These polyps were removed with a cold biopsy forceps. Resection and retrieval were complete. - A 3 mm polyp was found in the sigmoid colon. The polyp was sessile. The polyp was removed with a cold biopsy forceps. Resection and retrieval were complete. - There was spasm in the entire colon. - The exam was otherwise without abnormality. No overt inflammatory changes. Rectal vault was small, retroflexed views not obtained. Small hemorrhoids noted. - Biopsies for histology were taken with a cold forceps from the right colon, left colon and transverse colon for evaluation of microscopic colitis.  1. Surgical [P], colon, random sites - COLONIC MUCOSA WITH NO SIGNIFICANT HISTOPATHOLOGIC CHANGES. - NO MICROSCOPIC COLITIS, ACTIVE INFLAMMATION OR GRANULOMAS. 2. Surgical [P], colon, transverse, sigmoid x 1, polyp (6) - INFLAMMATORY POLYP (THREE). - POLYPOID COLONIC MUCOSA (THREE). - NO ADENOMATOUS CHANGE OR CARCINOMA.    Past Medical History:  Diagnosis  Date  . Angina   . Arthritis   . Atrial fibrillation (HCC)    ASPIRIN FOR BLOOD THINNER  . Atypical mole 09/22/2016  . Bulging discs    lumbar   . Complication of anesthesia    pt states has choking sensation with ET tube   . Coronary artery disease   . Depression   .  Diabetes mellitus    diet controlled/on meds  . Family history of breast cancer   . Family history of colon cancer   . Fibromyalgia   . GERD (gastroesophageal reflux disease)   . H/O hiatal hernia   . Headache(784.0)    "recurring"  . High cholesterol   . History of colon polyps   . Hyperlipidemia   . Hypertension   . 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 Melanoma ([place])    melanoma  . Migraines    "til ~ 1980"  . PONV (postoperative nausea and vomiting)   . Restless leg syndrome   . Sciatic nerve pain    "from pinched nerve"  . Sleep apnea    uses CPAP  . Stroke [facility]) 2014   no deficits  . Weakness of right side of body    "I've had PT for it; they don't know what it's from".  CORTISONE INJECTION INTO BACK 08/30/12     Past Surgical History:  Procedure Laterality Date  . [place] STUDY N/A 12/29/2015   Procedure: [place] STUDY;  Surgeon: Manus Gunning, MD;  Location: [facility];  Service: Gastroenterology;  Laterality: N/A;  . CARPAL TUNNEL RELEASE Left 07/02/2015   Procedure: CARPAL TUNNEL RELEASE;  Surgeon: Frederik Pear, MD;  Location: [place];  Service: Orthopedics;  Laterality: Left;  . CATARACT EXTRACTION, BILATERAL Bilateral    Oct and Nov 2017  . COLONOSCOPY  06/30/2019  . CORONARY ANGIOPLASTY WITH STENT PLACEMENT  06/2011   "1"  . CORONARY ARTERY BYPASS GRAFT  2005   CABG X 2  . DILATION AND CURETTAGE OF UTERUS     "more than once"  . ELBOW ARTHROSCOPY Left 07/02/2015   Procedure: ARTHROSCOPY LEFT ELBOW WITH DEBRIDEMENT AND REMOVAL LOOSE BODY;  Surgeon: Frederik Pear, MD;  Location: [place];  Service: Orthopedics;  Laterality: Left;  . ESOPHAGEAL DILATION  01/21/2020   Procedure: ESOPHAGEAL DILATION;  Surgeon: Lavena Bullion, DO;  [facility];  Service: Gastroenterology;;  . ESOPHAGEAL MANOMETRY N/A 12/29/2015   Procedure: ESOPHAGEAL MANOMETRY (EM);  Surgeon: Manus Gunning, MD;  [facility];   Service: Gastroenterology;  Laterality: N/A;  . ESOPHAGEAL MANOMETRY N/A 07/30/2019   Procedure: ESOPHAGEAL MANOMETRY (EM);  Surgeon: Yetta Flock, MD;  [facility];  Service: Gastroenterology;  Laterality: N/A;  . ESOPHAGOGASTRODUODENOSCOPY (EGD) WITH PROPOFOL N/A 01/21/2020   Procedure: ESOPHAGOGASTRODUODENOSCOPY (EGD) WITH PROPOFOL;  Surgeon: Lavena Bullion, DO;  [facility];  Service: Gastroenterology;  Laterality: N/A;  . FRACTURE SURGERY  ~ 2005   nose  . KNEE ARTHROSCOPY  09/04/2012   Procedure: ARTHROSCOPY KNEE;  Surgeon: Gearlean Alf, MD;  [facility];  Service: Orthopedics;  Laterality: Right;  right knee arthroscopy with medial and lateral meniscus debridement  . 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 MOUTH SURGERY  2004   "bone replacement; had cadavear bones put in; face was collapsing"  . NASAL SEPTUM SURGERY  ~ 1986  . TOTAL KNEE ARTHROPLASTY Right 07/07/2013   Procedure: RIGHT TOTAL KNEE  ARTHROPLASTY;  Surgeon: Gearlean Alf, MD;  [facility];  Service: Orthopedics;  Laterality: Right;  . TRANSORAL INCISIONLESS FUNDOPLICATION N/A 0000000   Procedure: TRANSORAL INCISIONLESS FUNDOPLICATION;  Surgeon: Lavena Bullion, DO;  [facility];  Service: Gastroenterology;  Laterality: N/A;  . UPPER GASTROINTESTINAL ENDOSCOPY     Family History  Problem Relation Age of Onset  . Breast cancer Mother 73  . Heart disease Father 48       sudden onset due to CAD  . Hypertension Sister   . Dementia Sister   . Diabetes Sister   . Cancer - Colon Sister 65  . Colon cancer Sister   . GER disease Daughter   . Hypertension Daughter   . Heart disease Brother   . Hypertension Brother   . Heart attack Neg Hx   . Stroke Neg Hx   . Esophageal cancer Neg Hx   . Rectal cancer Neg Hx   . Stomach cancer Neg Hx    Social History   Tobacco Use  . Smoking status: Never Smoker  . Smokeless tobacco: Never Used   Vaping Use  . Vaping Use: Never used  Substance Use Topics  . Alcohol use: Yes    Alcohol/week: 1.0 standard drink    Types: 1 Glasses of wine per week    Comment: "occasionally drink wine"  . Drug use: No   Current Outpatient Medications  Medication Sig Dispense Refill  . aspirin EC 81 MG tablet Take 1 tablet (81 mg total) by mouth daily.    [name] atorvastatin (LIPITOR) 80 MG tablet TAKE 1 TABLET BY MOUTH EVERY DAY 90 tablet 1  . BLINK TEARS 0.25 % SOLN Place 1 drop into both eyes 3 (three) times daily as needed (dry/irritated eyes.).    [name] calcium-vitamin D (OSCAL WITH D) 500-200 MG-UNIT tablet Take 1 tablet by mouth 2 (two) times daily.    [name] CARAFATE 1 GM/10ML suspension TAKE 10 MLS (1 G TOTAL) BY MOUTH EVERY 6 (SIX) HOURS AS NEEDED. 420 mL 3  . cetirizine (ZYRTEC) 10 MG tablet Take 10 mg by mouth daily.     . chlorthalidone (HYGROTON) 25 MG tablet TAKE 1 TABLET BY MOUTH EVERY DAY 90 tablet 3  . CINNAMON PO Take 1,000 mg by mouth in the morning and at bedtime.    . clonazePAM (KLONOPIN) 1 MG tablet TAKE 2 TABLETS BY MOUTH AT BEDTIME 60 tablet 0  . gabapentin (NEURONTIN) 300 MG capsule TAKE 2 CAPSULES (600 MG TOTAL) BY MOUTH AT BEDTIME. 180 capsule 1  . Insulin Pen Needle (NOVOFINE) 32G X 6 MM MISC USE TO INJECT VICTOZA DAILY. DX: E11.9 100 each 3  . Lancet Device MISC Use to check blood sugar twice daily. 1 each 0  . Lancets (FREESTYLE) lancets Use to check blood sugar twice daily 100 each 11  . metFORMIN (GLUCOPHAGE-XR) 500 MG 24 hr tablet TAKE 2 TABLETS BY MOUTH TWICE A DAY 360 tablet 3  . metoprolol succinate (TOPROL-XL) 100 MG 24 hr tablet TAKE 1 TABLET (100 MG TOTAL) BY MOUTH DAILY. TAKE WITH OR IMMEDIATELY FOLLOWING A MEAL. 90 tablet 3  . Multiple Vitamins-Minerals (PRESERVISION AREDS PO) Take 1 tablet by mouth 2 (two) times daily. Reported on 12/23/2015    . MYRBETRIQ 25 MG TB24 tablet TAKE 1 TABLET BY MOUTH EVERY DAY 90 tablet 3  . nitroGLYCERIN (NITROSTAT) 0.4 MG SL tablet PLACE 1  TABLET (0.4 MG TOTAL) UNDER THE TONGUE EVERY 5 (FIVE) MINUTES AS NEEDED FOR  CHEST PAIN 25 tablet 0  . omeprazole (PRILOSEC OTC) 20 MG tablet Take 20 mg by mouth daily.    Glory Rosebush ULTRA test strip USE TO CHECK BLOOD SUGAR TWICE DAILY 150 strip 3  . polyethylene glycol (MIRALAX) 17 g packet Use as directed once to twice daily (Patient taking differently: Take 17 g by mouth every other day. Use as directed once to twice daily) 14 each 0  . Probiotic Product (ALIGN PO) Take by mouth.    . triamcinolone cream (KENALOG) 0.1 % Apply 1 application topically 2 (two) times daily. 30 g 0  . venlafaxine XR (EFFEXOR-XR) 75 MG 24 hr capsule TAKE 1 CAPSULE (75 MG TOTAL) BY MOUTH DAILY WITH BREAKFAST. 90 capsule 1  . VICTOZA 18 MG/3ML SOPN INJECT 1.2 MG UNDER THE SKIN ONCE DAILY 18 mL 5  . simethicone (MYLICON) 40 BJ/6.2GB drops Take 1.2 mLs (80 mg total) by mouth 4 (four) times daily as needed for flatulence. 30 mL 0   No current facility-administered medications for this visit.   Allergies  Allergen Reactions  . Hydrocodone-Acetaminophen Other (See Comments)    "Changed personality" "made me very mean"  . Percocet [Oxycodone-Acetaminophen] Other (See Comments)    hallucination  . Sulfa Antibiotics Other (See Comments)    hallucinations     Review of Systems: All systems reviewed and negative except where noted in HPI.   Lab Results  Component Value Date   WBC 10.2 01/22/2020   HGB 9.5 (L) 01/22/2020   HCT 31.9 (L) 01/22/2020   MCV 84.4 01/22/2020   PLT 325 01/22/2020    Lab Results  Component Value Date   CREATININE 0.96 02/08/2021   BUN 27 (H) 02/08/2021   NA 141 02/08/2021   K 4.0 02/08/2021   CL 103 02/08/2021   CO2 32 02/08/2021    Lab Results  Component Value Date   ALT 19 02/08/2021   AST 19 02/08/2021   ALKPHOS 59 02/08/2021   BILITOT 0.3 02/08/2021     Physical Exam: BP 122/70   Pulse 73   Wt 147 lb 9.6 oz (67 kg)   SpO2 98%   BMI 27.00 kg/m  Constitutional:  Pleasant,well-developed, female in no acute distress. Neurological: Alert and oriented to person place and time. Psychiatric: Normal mood and affect. Behavior is normal.   ASSESSMENT AND PLAN: 80 year old female here for reassessment of following:  Altered bowel habits  Ongoing symptoms now for a few years.  At the last visit I thought she potentially had some overflow incontinence causing this in the setting of baseline constipation.  On MiraLAX she is not really having much constipation but she is at increased frequency of these urgent loose stools, this regimen has not helped her.  She has had a colonoscopy which shows no inflammation, no microscopic colitis.  Discussed possible etiologies.  Time course of her bowel symptoms does seem to line up with her Victoza use.  I reviewed Victoza with her and possible side effects, diarrhea is present in a significant percentage of patients on this regimen.  Given her persistent symptoms I recommend she talk with her primary care about tapering off this and using another regimen to control her diabetes if possible.  She is agreeable with this.  I will screen her with labs to rule out celiac disease as she has not had that before.  We otherwise discussed dietary measures that can help this, recommend a low FODMAP diet and provided her handout on that.  I  think she should stop the MiraLAX at this point and start Citrucel once daily to see if that will help provide some regularity.  She can continue to take Imodium if she has explosive episodes of watery stool as it does seem to help.  We will await her course on the regimen and see how she does.  She should contact me in a couple weeks if no better.  I will reach out to Dr. Diona Browner to see if okay if she comes off Victoza for period of time  Plan: - will see if she can stop Victoza for a period of time, will discuss with Dr. Diona Browner - labs to rule out celiac disease - low FODMAP diet - stop Miralax  - start  Citrucel daily - immodium PRN  [place] Cellar, MD [facility]

## 2021-04-01 LAB — IGG: IgG (Immunoglobin G), Serum: 721 mg/dL (ref 600–1540)

## 2021-04-01 LAB — TISSUE TRANSGLUTAMINASE, IGA: (tTG) Ab, IgA: <1 U/mL

## 2021-04-01 NOTE — Progress Notes (Signed)
Call patient Message from Dr. Havery Moros to try trial off Victoza as may be causing diarrhea.. Given A1C so low at last check.. continue all meds but hold Victoza.. follow daily fasting blood sugars and occ 2 hour post prandial.. call with measurements off victoza and update on symptoms in 2 weeks.

## 2021-04-04 ENCOUNTER — Telehealth: Payer: Self-pay | Admitting: *Deleted

## 2021-04-04 DIAGNOSIS — H3540 Unspecified peripheral retinal degeneration: Secondary | ICD-10-CM | POA: Diagnosis not present

## 2021-04-04 DIAGNOSIS — H353132 Nonexudative age-related macular degeneration, bilateral, intermediate dry stage: Secondary | ICD-10-CM | POA: Diagnosis not present

## 2021-04-04 DIAGNOSIS — H35342 Macular cyst, hole, or pseudohole, left eye: Secondary | ICD-10-CM | POA: Diagnosis not present

## 2021-04-04 DIAGNOSIS — H35372 Puckering of macula, left eye: Secondary | ICD-10-CM | POA: Diagnosis not present

## 2021-04-04 NOTE — Telephone Encounter (Signed)
-----   Message from Jinny Sanders, MD sent at 04/01/2021  2:52 PM EDT -----   ----- Message ----- From: Yetta Flock, MD Sent: 03/31/2021   4:49 PM EDT To: Jinny Sanders, MD

## 2021-04-04 NOTE — Telephone Encounter (Signed)
Returning phone call and she will be not in until 430

## 2021-04-04 NOTE — Telephone Encounter (Addendum)
  Message from Dr. Havery Moros to try trial off Victoza as may be causing diarrhea.. Given A1C so low at last check.. continue all meds but hold Victoza.. follow daily fasting blood sugars and occ 2 hour post prandial.. call with measurements off victoza and update on symptoms in 2 weeks.   Left message for Lovey Newcomer to return my call.

## 2021-04-04 NOTE — Progress Notes (Signed)
Tried calling the patient, she did not answer but she was left a VM for her to call back.

## 2021-04-04 NOTE — Telephone Encounter (Signed)
Morgan [name] notified as instructed by telephone.  Patient states understanding.  Medication list updated.

## 2021-04-04 NOTE — Progress Notes (Signed)
See phone encounter from 04/04/2021.

## 2021-05-04 ENCOUNTER — Other Ambulatory Visit: Payer: Self-pay | Admitting: Cardiology

## 2021-05-11 ENCOUNTER — Other Ambulatory Visit: Payer: Self-pay | Admitting: Family Medicine

## 2021-05-11 DIAGNOSIS — Z1231 Encounter for screening mammogram for malignant neoplasm of breast: Secondary | ICD-10-CM

## 2021-05-19 ENCOUNTER — Other Ambulatory Visit: Payer: Self-pay

## 2021-05-19 ENCOUNTER — Ambulatory Visit
Admission: RE | Admit: 2021-05-19 | Discharge: 2021-05-19 | Disposition: A | Discharge status: Home / Self Care | Payer: Medicare PPO | Source: Ambulatory Visit | Attending: Family Medicine | Admitting: Family Medicine

## 2021-05-19 DIAGNOSIS — Z1231 Encounter for screening mammogram for malignant neoplasm of breast: Secondary | ICD-10-CM | POA: Insufficient documentation

## 2021-05-22 ENCOUNTER — Other Ambulatory Visit: Payer: Self-pay | Admitting: Family Medicine

## 2021-05-22 NOTE — Telephone Encounter (Signed)
Last office visit 02/10/21 for CPE.  Last refilled 04/12/21 for #60 with no refills.  Next Appt: 08/16/21 for DM.

## 2021-05-31 DIAGNOSIS — D485 Neoplasm of uncertain behavior of skin: Secondary | ICD-10-CM | POA: Diagnosis not present

## 2021-05-31 DIAGNOSIS — L821 Other seborrheic keratosis: Secondary | ICD-10-CM | POA: Diagnosis not present

## 2021-05-31 DIAGNOSIS — D225 Melanocytic nevi of trunk: Secondary | ICD-10-CM | POA: Diagnosis not present

## 2021-05-31 DIAGNOSIS — D2271 Melanocytic nevi of right lower limb, including hip: Secondary | ICD-10-CM | POA: Diagnosis not present

## 2021-05-31 DIAGNOSIS — D1801 Hemangioma of skin and subcutaneous tissue: Secondary | ICD-10-CM | POA: Diagnosis not present

## 2021-05-31 DIAGNOSIS — D2261 Melanocytic nevi of right upper limb, including shoulder: Secondary | ICD-10-CM | POA: Diagnosis not present

## 2021-05-31 DIAGNOSIS — L814 Other melanin hyperpigmentation: Secondary | ICD-10-CM | POA: Diagnosis not present

## 2021-05-31 DIAGNOSIS — D2371 Other benign neoplasm of skin of right lower limb, including hip: Secondary | ICD-10-CM | POA: Diagnosis not present

## 2021-05-31 DIAGNOSIS — D2262 Melanocytic nevi of left upper limb, including shoulder: Secondary | ICD-10-CM | POA: Diagnosis not present

## 2021-06-28 ENCOUNTER — Telehealth (INDEPENDENT_AMBULATORY_CARE_PROVIDER_SITE_OTHER): Payer: Medicare PPO | Admitting: Family Medicine

## 2021-06-28 ENCOUNTER — Encounter: Payer: Self-pay | Admitting: Family Medicine

## 2021-06-28 VITALS — BP 143/67 | Temp 100.4°F | Wt 145.0 lb

## 2021-06-28 DIAGNOSIS — Z20822 Contact with and (suspected) exposure to covid-19: Secondary | ICD-10-CM | POA: Diagnosis not present

## 2021-06-28 DIAGNOSIS — U071 COVID-19: Secondary | ICD-10-CM | POA: Diagnosis not present

## 2021-06-28 MED ORDER — BENZONATATE 100 MG PO CAPS: 100.0000 mg | ORAL_CAPSULE | Freq: Three times a day (TID) | ORAL | 0 refills | Status: DC | PRN

## 2021-06-28 MED ORDER — MOLNUPIRAVIR EUA 200MG CAPSULE: 4.0000 | ORAL_CAPSULE | Freq: Two times a day (BID) | ORAL | 0 refills | Status: AC

## 2021-06-28 NOTE — Progress Notes (Signed)
I connected with Abrina Downum on 06/28/21 at  2:40 PM EDT by video and verified that I am speaking with the correct person using two identifiers.   I discussed the limitations, risks, security and privacy concerns of performing an evaluation and management service by video and the availability of in person appointments. I also discussed with the patient that there may be a patient responsible charge related to this service. The patient expressed understanding and agreed to proceed.  Patient location: Home Provider Location: [place] Participants: Lesleigh Noe and Margaretmary Lombard Hanak   Subjective:     Genni Carlock is a 80 y.o. female presenting for Covid Positive (Symptom onset 06/27/21), Headache, Nasal Congestion, Cough, and Sore Throat     Headache  Associated symptoms include coughing, a fever (100) and a sore throat. Pertinent negatives include no nausea or vomiting.  Cough Associated symptoms include a fever (100), headaches, a sore throat and shortness of breath. Pertinent negatives include no chest pain, chills or myalgias.  Sore Throat  Associated symptoms include congestion, coughing, headaches and shortness of breath. Pertinent negatives include no diarrhea or vomiting.   #Covid 19 - 06/27/2021 - HA - severe - coughing - cannot stop this  - sore throat - SOB  Treatment: has not tried any medications   Review of Systems  Constitutional:  Positive for fever (100). Negative for chills.  HENT:  Positive for congestion and sore throat.   Respiratory:  Positive for cough and shortness of breath.   Cardiovascular:  Negative for chest pain.  Gastrointestinal:  Negative for diarrhea, nausea and vomiting.  Musculoskeletal:  Negative for arthralgias and myalgias.  Neurological:  Positive for headaches.    Social History   Tobacco Use  Smoking Status Never  Smokeless Tobacco Never        Objective:   BP Readings from Last 3 Encounters:   06/28/21 (!) 143/67  03/31/21 122/70  02/10/21 122/70   Wt Readings from Last 3 Encounters:  06/28/21 145 lb (65.8 kg)  03/31/21 147 lb 9.6 oz (67 kg)  02/10/21 150 lb 12 oz (68.4 kg)   BP (!) 143/67   Temp (!) 100.4 F (38 C) (Oral)   Wt 145 lb (65.8 kg)   BMI 26.52 kg/m   Physical Exam Constitutional:      Appearance: Normal appearance. She is not ill-appearing.  HENT:     Head: Normocephalic and atraumatic.     Right Ear: External ear normal.     Left Ear: External ear normal.  Eyes:     Conjunctiva/sclera: Conjunctivae normal.  Pulmonary:     Effort: Pulmonary effort is normal. No respiratory distress.  Neurological:     Mental Status: She is alert. Mental status is at baseline.  Psychiatric:        Mood and Affect: Mood normal.        Behavior: Behavior normal.        Thought Content: Thought content normal.        Judgment: Judgment normal.            Assessment & Plan:   Problem List Items Addressed This Visit   None Visit Diagnoses     COVID-19 virus infection    -  Primary   Relevant Medications   molnupiravir EUA 200 mg CAPS   benzonatate (TESSALON PERLES) 100 MG capsule      Patient is at increased risk for developing severe covid due to age, CAD, HTN, and  diabetes. They are eligible for anti-viral medication.   Discussed Molnupiravir and they would like to start. Elected not to do paxlovid due to CKD and concern for drug interactions  Medication sent to pharmacy  Reviewed ER and return precautions  Reviewed isolation guidelines.    Return if symptoms worsen or fail to improve.  Lesleigh Noe, MD

## 2021-06-28 NOTE — Patient Instructions (Signed)
Antibiotics are not need for a viral infection but the following will help:   Drink plenty of fluids Get lots of rest  Sinus Congestion 1) Neti Pot (Saline rinse) -- 2 times day -- if tolerated 2) Flonase (Store Brand ok) - once daily 3) Over the counter congestion medications  Cough 1) Cough drops can be helpful 2) Nyquil (or nighttime cough medication) 3) Honey is proven to be one of the best cough medications  4) Cough medicine with Dextromethorphan can also be helpful  Sore Throat 1) Honey as above, cough drops 2) Ibuprofen or Aleve can be helpful 3) Salt water Gargles

## 2021-07-01 ENCOUNTER — Telehealth: Payer: Self-pay

## 2021-07-01 ENCOUNTER — Other Ambulatory Visit: Payer: Self-pay | Admitting: Family Medicine

## 2021-07-01 DIAGNOSIS — R0789 Other chest pain: Secondary | ICD-10-CM | POA: Diagnosis not present

## 2021-07-01 DIAGNOSIS — U071 COVID-19: Secondary | ICD-10-CM | POA: Diagnosis not present

## 2021-07-01 DIAGNOSIS — J22 Unspecified acute lower respiratory infection: Secondary | ICD-10-CM | POA: Diagnosis not present

## 2021-07-01 DIAGNOSIS — R0600 Dyspnea, unspecified: Secondary | ICD-10-CM | POA: Diagnosis not present

## 2021-07-01 NOTE — Telephone Encounter (Signed)
Agree with need for in person exam.

## 2021-07-01 NOTE — Telephone Encounter (Signed)
Pt left a VM on triage stating she tested positive for Covid 5 days ago and did a virtual visit with Dr Humphrey Rolls. She said she was told she should be seeing some improvement by now but she is having chest congestion and tightness, cough, hoarse, and just feels "terrible". I called her back and advised that since she is having chest tightness and cough that is not improving, I suggests that she go to an UC or WIC that has x-ray. Advised her we are without x-ray for the foreseeable future. She will most likely go to the [place] at [facility].   FYI to Dr Diona Browner.

## 2021-07-01 NOTE — Telephone Encounter (Signed)
Last office visit 06/28/2021 (virtual) with Dr. Einar Pheasant for Covid.  Last refilled 01/03/2021 for #180 with 1 refill.  Next Appt: 08/16/2021 for DM.

## 2021-07-19 ENCOUNTER — Telehealth: Payer: Self-pay | Admitting: *Deleted

## 2021-07-19 NOTE — Telephone Encounter (Signed)
Pt has scheduled an appt on 07/21/21 with PCP to discuss this

## 2021-07-19 NOTE — Telephone Encounter (Signed)
[place] Night - Client TELEPHONE ADVICE RECORD [facility] Patient Name: Morgan [name] Gender: Female DOB: 07/03/1941 Age: 80 Y 3 M 25 D Return Phone Number: [phone] (Primary), [phone] (Secondary) Address[place]  [address] Client [facility] Night - Client Client Site [place] Physician Eliezer Lofts - MD Contact Type Call Who Is Calling Patient / Member / Family / Caregiver Call Type Triage / Clinical Caller Name Jenny Reichmann Little Relationship To Patient Daughter Return Phone Number [phone] (Primary) Chief Complaint Blood Pressure High Reason for Call Symptomatic / Request for [place] states her mother was traveling longer then normal. She was without medication for 3-4 days. She has restarted her medication today. She is feeling strange. Her blood pressure is higher as well as her blood sugar. Her BP is 183/95 and blood sugar is 190 Translation No Nurse Assessment Nurse: Hassell Done, RN, Melanie Date/Time (Eastern Time): 07/18/2021 3:06:25 PM Confirm and document reason for call. If symptomatic, describe symptoms. ---Caller states she was traveling for 3-4 extra days. Took metoprolol and metformin when she got home today. She was also without her clonazepam during this 3-4 days. Was not able to sleep. Took venlaxine but has not taken any klonipin yet. Takes 1 capsule of klonopin at HS 1 mg. Was shaky when daughter first got there. Does the patient have any new or worsening symptoms? ---Yes Will a triage be completed? ---Yes Related visit to physician within the last 2 weeks? ---No Does the PT have any chronic conditions? (i.e. diabetes, asthma, this includes High risk factors for pregnancy, etc.) ---Yes List chronic conditions. ---diabetes high blood pressure CAD post stroke Is this a behavioral health or substance  abuse call? ---No Guidelines Guideline Title Affirmed Question Affirmed Notes Nurse Date/Time (Eastern Time) Neurologic Deficit [1] Numbness or tingling on both sides [place], [place], Threasa Beards 07/18/2021 3:13:07 PM PLEASE NOTE: All timestamps contained within this report are represented as [place] Standard Time. CONFIDENTIALTY NOTICE: This fax transmission is intended only for the addressee. It contains information that is legally privileged, confidential or otherwise protected from use or disclosure. If you are not the intended recipient, you are strictly prohibited from reviewing, disclosing, copying using or disseminating any of this information or taking any action in reliance on or regarding this information. If you have received this fax in error, please notify us immediately by telephone so that we can arrange for its return to [place]. Phone: [phone], Toll-Free: [phone], Fax: [phone]

## 2021-07-19 NOTE — Telephone Encounter (Signed)
Noted  

## 2021-07-21 ENCOUNTER — Ambulatory Visit
Admission: RE | Admit: 2021-07-21 | Discharge: 2021-07-21 | Disposition: A | Discharge status: Home / Self Care | Payer: Medicare PPO | Source: Ambulatory Visit | Attending: Family Medicine | Admitting: Family Medicine

## 2021-07-21 ENCOUNTER — Ambulatory Visit: Payer: Medicare PPO | Admitting: Family Medicine

## 2021-07-21 ENCOUNTER — Ambulatory Visit: Payer: Medicare PPO

## 2021-07-21 ENCOUNTER — Other Ambulatory Visit: Payer: Self-pay

## 2021-07-21 ENCOUNTER — Telehealth: Payer: Self-pay

## 2021-07-21 VITALS — BP 98/50 | HR 62 | Temp 97.5°F | Wt 146.5 lb

## 2021-07-21 DIAGNOSIS — I480 Paroxysmal atrial fibrillation: Secondary | ICD-10-CM

## 2021-07-21 DIAGNOSIS — R410 Disorientation, unspecified: Secondary | ICD-10-CM | POA: Diagnosis not present

## 2021-07-21 DIAGNOSIS — R29810 Facial weakness: Secondary | ICD-10-CM | POA: Insufficient documentation

## 2021-07-21 DIAGNOSIS — E860 Dehydration: Secondary | ICD-10-CM | POA: Diagnosis not present

## 2021-07-21 DIAGNOSIS — Z8673 Personal history of transient ischemic attack (TIA), and cerebral infarction without residual deficits: Secondary | ICD-10-CM

## 2021-07-21 DIAGNOSIS — R4781 Slurred speech: Secondary | ICD-10-CM | POA: Insufficient documentation

## 2021-07-21 DIAGNOSIS — F1320 Sedative, hypnotic or anxiolytic dependence, uncomplicated: Secondary | ICD-10-CM | POA: Insufficient documentation

## 2021-07-21 DIAGNOSIS — G319 Degenerative disease of nervous system, unspecified: Secondary | ICD-10-CM | POA: Diagnosis not present

## 2021-07-21 DIAGNOSIS — F331 Major depressive disorder, recurrent, moderate: Secondary | ICD-10-CM | POA: Diagnosis not present

## 2021-07-21 DIAGNOSIS — R519 Headache, unspecified: Secondary | ICD-10-CM | POA: Diagnosis not present

## 2021-07-21 DIAGNOSIS — G9389 Other specified disorders of brain: Secondary | ICD-10-CM | POA: Diagnosis not present

## 2021-07-21 MED ORDER — VALACYCLOVIR HCL 1 G PO TABS: 1000.0000 mg | ORAL_TABLET | Freq: Three times a day (TID) | ORAL | 0 refills | Status: DC

## 2021-07-21 NOTE — Telephone Encounter (Signed)
Dorian Pod, of MRI dept at [facility], giving call report of MRI.  Dr. Diona Browner notified and made aware pt is still there.  Dr. Diona Browner spoke with Dorian Pod.

## 2021-07-21 NOTE — Patient Instructions (Addendum)
Call if interested in counselor or info on grieving.  Continue current meds for now with plan to wean down off clonazepam at next OV.  Follow BP at home call if < 90/60   Keep up with fluids.  We will work on setting up MRI brain.  Please stop at the lab to have labs drawn.

## 2021-07-21 NOTE — Progress Notes (Signed)
Patient ID: Morgan [name], female    DOB: 12-03-1940, 80 y.o.   MRN: [id number]  This visit was conducted in person.  BP (!) 98/50   Pulse 62   Temp (!) 97.5 F (36.4 C) (Temporal)   Wt 146 lb 8 oz (66.5 kg)   SpO2 95%   BMI 26.80 kg/m    CC: Chief Complaint  Patient presents with   Medication Problem    Concerned about clonazepam. She had a "bad" episode and had withdrawals from it.     Subjective:   HPI: Morgan [name] is a 80 y.o. female presenting on 07/21/2021 for Medication Problem (Concerned about clonazepam. She had a "bad" episode and had withdrawals from it. )   She was without all her medication when a trip to a funeral  ( sister passed from [place] and dementia) went on longer than expected.  She was out of the clonazepam  ( uses 1 mg 2 tabs at bedtime for sleep).    She noted 2 days after missing meds.. she could not sleep ( tried OTC sleeping  med did not help).  On day 3 she had confusion, hallucinations, fear, nightmares, paranoia. Shaking all over. Numbness in feet and in hand ( had not been taking neurontin for neuropathy as wel)  Arrived home 9/5  was mumbling, confused.  Right face seemed to be drooping  BP 191/90 CBG 170 post prandially Called on call nurse.. told to take 1/2 tab clonazepam then and to take regular meds.  Since then confusion has improved but still at baseline.  Still some numbness in  both fete, but no longer in hands.   BP Readings from Last 3 Encounters:  07/21/21 (!) 98/50  06/28/21 (!) 143/67  03/31/21 122/70   FBS 161   PHQ9 today 15  GAD7 11   Had COVID on 07/01/2021 She is still very tired and has mild dry cough, no further fever, no SOB.   She has been on clonazepam ( on it for anxiety and RLS)for years. > 8 years.. Started by Dr. Valera Castle.  On venlafaxine mg daily.. prior to sister passing mood was well controlled. She is now very ready to wean off clonazepam.     Relevant past medical, surgical,  family and social history reviewed and updated as indicated. Interim medical history since our last visit reviewed. Allergies and medications reviewed and updated. Outpatient Medications Prior to Visit  Medication Sig Dispense Refill   aspirin EC 81 MG tablet Take 1 tablet (81 mg total) by mouth daily.     atorvastatin (LIPITOR) 80 MG tablet TAKE 1 TABLET BY MOUTH EVERY DAY 90 tablet 1   BLINK TEARS 0.25 % SOLN Place 1 drop into both eyes 3 (three) times daily as needed (dry/irritated eyes.).     calcium-vitamin D (OSCAL WITH D) 500-200 MG-UNIT tablet Take 1 tablet by mouth 2 (two) times daily.     CARAFATE 1 GM/10ML suspension TAKE 10 MLS (1 G TOTAL) BY MOUTH EVERY 6 (SIX) HOURS AS NEEDED. 420 mL 3   cetirizine (ZYRTEC) 10 MG tablet Take 10 mg by mouth daily.      chlorthalidone (HYGROTON) 25 MG tablet TAKE 1 TABLET BY MOUTH EVERY DAY 90 tablet 3   CINNAMON PO Take 1,000 mg by mouth in the morning and at bedtime.     clonazePAM (KLONOPIN) 1 MG tablet TAKE 2 TABLETS BY MOUTH AT BEDTIME 60 tablet 0   gabapentin (NEURONTIN) 300 MG capsule TAKE  2 CAPSULES BY MOUTH AT BEDTIME. 180 capsule 1   Insulin Pen Needle (NOVOFINE) 32G X 6 MM MISC USE TO INJECT VICTOZA DAILY. DX: E11.9 100 each 3   Lancet Device MISC Use to check blood sugar twice daily. 1 each 0   Lancets (FREESTYLE) lancets Use to check blood sugar twice daily 100 each 11   metFORMIN (GLUCOPHAGE-XR) 500 MG 24 hr tablet TAKE 2 TABLETS BY MOUTH TWICE A DAY 360 tablet 3   methylcellulose (CITRUCEL) oral powder Use as directed daily     metoprolol succinate (TOPROL-XL) 100 MG 24 hr tablet TAKE 1 TABLET (100 MG TOTAL) BY MOUTH DAILY. TAKE WITH OR IMMEDIATELY FOLLOWING A MEAL. 90 tablet 3   Multiple Vitamins-Minerals (PRESERVISION AREDS PO) Take 1 tablet by mouth 2 (two) times daily. Reported on 12/23/2015     MYRBETRIQ 25 MG TB24 tablet TAKE 1 TABLET BY MOUTH EVERY DAY 90 tablet 3   nitroGLYCERIN (NITROSTAT) 0.4 MG SL tablet PLACE 1 TABLET (0.4  MG TOTAL) UNDER THE TONGUE EVERY 5 (FIVE) MINUTES AS NEEDED FOR CHEST PAIN 25 tablet 0   omeprazole (PRILOSEC OTC) 20 MG tablet Take 20 mg by mouth daily.     ONETOUCH ULTRA test strip USE TO CHECK BLOOD SUGAR TWICE DAILY 150 strip 3   Probiotic Product (ALIGN PO) Take by mouth.     triamcinolone cream (KENALOG) 0.1 % Apply 1 application topically 2 (two) times daily. 30 g 0   venlafaxine XR (EFFEXOR-XR) 75 MG 24 hr capsule TAKE 1 CAPSULE (75 MG TOTAL) BY MOUTH DAILY WITH BREAKFAST. 90 capsule 1   simethicone (MYLICON) 40 [id number] drops Take 1.2 mLs (80 mg total) by mouth 4 (four) times daily as needed for flatulence. 30 mL 0   benzonatate (TESSALON PERLES) 100 MG capsule Take 1 capsule (100 mg total) by mouth 3 (three) times daily as needed. 20 capsule 0   No facility-administered medications prior to visit.     Per HPI unless specifically indicated in ROS section below Review of Systems  Constitutional:  Positive for fatigue. Negative for fever.  HENT:  Negative for congestion.   Eyes:  Negative for pain.  Respiratory:  Negative for cough and shortness of breath.   Cardiovascular:  Negative for chest pain, palpitations and leg swelling.  Gastrointestinal:  Negative for abdominal pain.  Genitourinary:  Negative for dysuria and vaginal bleeding.  Musculoskeletal:  Negative for back pain.  Neurological:  Positive for numbness. Negative for syncope, light-headedness and headaches.  Psychiatric/Behavioral:  Negative for dysphoric mood.   Objective:  BP (!) 98/50   Pulse 62   Temp (!) 97.5 F (36.4 C) (Temporal)   Wt 146 lb 8 oz (66.5 kg)   SpO2 95%   BMI 26.80 kg/m   Wt Readings from Last 3 Encounters:  07/21/21 146 lb 8 oz (66.5 kg)  06/28/21 145 lb (65.8 kg)  03/31/21 147 lb 9.6 oz (67 kg)      Physical Exam Constitutional:      General: She is not in acute distress.    Appearance: Normal appearance. She is well-developed. She is not ill-appearing or toxic-appearing.  HENT:      Head: Normocephalic.     Right Ear: Hearing, tympanic membrane, ear canal and external ear normal. Tympanic membrane is not erythematous, retracted or bulging.     Left Ear: Hearing, tympanic membrane, ear canal and external ear normal. Tympanic membrane is not erythematous, retracted or bulging.     Nose: No mucosal  edema or rhinorrhea.     Right Sinus: No maxillary sinus tenderness or frontal sinus tenderness.     Left Sinus: No maxillary sinus tenderness or frontal sinus tenderness.     Mouth/Throat:     Pharynx: Uvula midline.  Eyes:     General: Lids are normal. Lids are everted, no foreign bodies appreciated.     Conjunctiva/sclera: Conjunctivae normal.     Pupils: Pupils are equal, round, and reactive to light.  Neck:     Thyroid: No thyroid mass or thyromegaly.     Vascular: No carotid bruit.     Trachea: Trachea normal.  Cardiovascular:     Rate and Rhythm: Normal rate and regular rhythm.     Pulses: Normal pulses.     Heart sounds: Normal heart sounds, S1 normal and S2 normal. No murmur heard.   No friction rub. No gallop.  Pulmonary:     Effort: Pulmonary effort is normal. No tachypnea or respiratory distress.     Breath sounds: Normal breath sounds. No decreased breath sounds, wheezing, rhonchi or rales.  Abdominal:     General: Bowel sounds are normal.     Palpations: Abdomen is soft.     Tenderness: There is no abdominal tenderness.  Musculoskeletal:     Cervical back: Normal range of motion and neck supple.  Skin:    General: Skin is warm and dry.     Findings: No rash.  Neurological:     Mental Status: She is alert and oriented to person, place, and time.     Cranial Nerves: Cranial nerves are intact.     Sensory: Sensation is intact.     Motor: Motor function is intact.     Coordination: Coordination is intact. Coordination normal.     Gait: Gait is intact.  Psychiatric:        Mood and Affect: Mood is not anxious or depressed.        Speech: Speech  normal.        Behavior: Behavior normal. Behavior is cooperative.        Thought Content: Thought content normal.        Judgment: Judgment normal.      Results for orders placed or performed in visit on 03/31/21  Tissue transglutaminase, IgA  Result Value Ref Range   (tTG) Ab, IgA <1.0 U/mL  IgG  Result Value Ref Range   IgG (Immunoglobin G), Serum 721 600 - 1,540 mg/dL    This visit occurred during the SARS-CoV-2 public health emergency.  Safety protocols were in place, including screening questions prior to the visit, additional usage of staff PPE, and extensive cleaning of exam room while observing appropriate contact time as indicated for disinfecting solutions.   COVID 19 screen:  No recent travel or known exposure to COVID19 The patient denies respiratory symptoms of COVID 19 at this time. The importance of social distancing was discussed today.   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 Likely dehydration as well.  Given acute neuro changes.. possible acute TIA/CVA as a result of elevated BP vs possible benzodiazapine withdrawal  vs seizure  Will eval with MRI brain urgently to  assess for CVA.    Since restarting all meds, BP has come down ( low normal today), neuro changes slowly resolving, still not at baseline mentation per pt and husband Encourage fluids  as likely dehydrated.  Check CMET and cbc.  Sore scalp:  no rash.. possible early shingles outbreak  Per pt no fall when ill.  If not improving or new rash.. start valacyclovir for shingles treatment.  Follow up in 2 weeks to re-eval mood as well as to start clonazepam wean given this is high risk medication and she is now actively read yo find better treatment for her insomnia and RLS.  Eliezer Lofts, MD

## 2021-08-02 ENCOUNTER — Other Ambulatory Visit: Payer: Self-pay | Admitting: Family Medicine

## 2021-08-03 ENCOUNTER — Other Ambulatory Visit: Payer: Self-pay | Admitting: Family Medicine

## 2021-08-03 NOTE — Telephone Encounter (Signed)
Left message for Morgan [name] to return call to office in regards to refill request we received today.

## 2021-08-03 NOTE — Telephone Encounter (Signed)
Last office visit 07/21/2021 for medication problem.  Last refilled 05/23/2021 for #60 with no refills.  Next Appt: 08/16/21 for DM.

## 2021-08-03 NOTE — Telephone Encounter (Signed)
Spoke with Morgan [name].  She is not wanting to wean off at this time.  Wants to wait and discuss this further at her upcoming office visit in October.

## 2021-08-03 NOTE — Progress Notes (Signed)
Cardiology Office Note:    Date:  08/04/2021   ID:  Morgan [name], DOB [birth year], MRN [id number]  PCP:  Morgan Sanders, MD  [facility] Cardiologist:  Morgan Furbish, MD  [facility] Electrophysiologist:  None   Referring MD: Morgan Sanders, MD    History of Present Illness:    Morgan [name] is a 80 y.o. female here for the follow-up of coronary artery disease, hypertension and atrial fibrillation. Post CABG in 2006 and diagonal stent after CABG.Cerebellar stroke in 2015. Angioedema right posterior neck melanoma discovered by Dr. Radford [name]. Nuclear stress test 2017 reassured. Had a fundoplication for her esophagus in March 2021. Prior shortness of breath attributed to deconditioning.  Echo had been reassured. Reported shortness of breath with incline and fatigue. She had poor balance and broke both arms due to fallen.   Today, she reports she is not doing well. In August she went to [place] for 3 weeks and have gotten COVID which was a bad experience. After a week, she went to a [place] clinic and received her booster shot and covid pills. After testing negative, she went back to [place] due to her sister in law passing. She includes she didn't take enough medications with her on the trip and was off of her meds for 4 days. At the end of her trip, she started experiencing hallucinations, heart racing and word slurring. On the way back home, she called her daughter and she scheduled her an appointment with Morgan [name] and was told to take half of Clonazepam and an MRI was ordered. She now states she feels like she is back on track. She includes now Morgan [name] wants her to discontinue Clonazepam completely.  She also includes she can feel her palpitations that pounds and skip and have chest pains. She currently is taking Metoprolol Succinate 100 mg.   She wanted to discuss on any other mediations that can help her with anxiety and restless legs, that effects her sleep.    She denies any,  shortness of breath, fatigue, headaches, GI or GU symptoms, vomiting, lightheadedness, dizziness, or syncope.   Past Medical History:  Diagnosis Date   Angina    Arthritis    Atrial fibrillation ([place])    ASPIRIN FOR BLOOD THINNER   Atypical mole 09/22/2016   Bulging discs    lumbar    Complication of anesthesia    pt states has choking sensation with ET tube    Coronary artery disease    Depression    Diabetes mellitus    diet controlled/on meds   Family history of breast cancer    Family history of colon cancer    Fibromyalgia    GERD (gastroesophageal reflux disease)    H/O hiatal hernia    Headache(784.0)    "recurring"   High cholesterol    History of colon polyps    Hyperlipidemia    Hypertension    [place] esophagus    Melanoma ([place])    melanoma   Migraines    "til ~ 1980"   PONV (postoperative nausea and vomiting)    Restless leg syndrome    Sciatic nerve pain    "from pinched nerve"   Sleep apnea    uses CPAP   Stroke ([place]) 2014   no deficits   Weakness of right side of body    "I've had PT for it; they don't know what it's from".  CORTISONE INJECTION INTO BACK 08/30/12    Past Surgical History:  Procedure Laterality Date   43 HOUR [place] STUDY N/A 12/29/2015   Procedure: 24 HOUR PH STUDY;  Surgeon: Manus Gunning, MD;  Location: [facility];  Service: Gastroenterology;  Laterality: N/A;   CARPAL TUNNEL RELEASE Left 07/02/2015   Procedure: CARPAL TUNNEL RELEASE;  Surgeon: Frederik Pear, MD;  Location: [place];  Service: Orthopedics;  Laterality: Left;   CATARACT EXTRACTION, BILATERAL Bilateral    Oct and Nov 2017   COLONOSCOPY  06/30/2019   CORONARY ANGIOPLASTY WITH STENT PLACEMENT  06/2011   "1"   CORONARY ARTERY BYPASS GRAFT  2005   CABG X 2   DILATION AND CURETTAGE OF UTERUS     "more than once"   ELBOW ARTHROSCOPY Left 07/02/2015   Procedure: ARTHROSCOPY LEFT ELBOW WITH DEBRIDEMENT AND REMOVAL LOOSE BODY;  Surgeon: Frederik Pear, MD;  Location: [place];  Service: Orthopedics;  Laterality: Left;   ESOPHAGEAL DILATION  01/21/2020   Procedure: ESOPHAGEAL DILATION;  Surgeon: Lavena Bullion, DO;  [facility]: [facility];  Service: Gastroenterology;;   ESOPHAGEAL MANOMETRY N/A 12/29/2015   Procedure: ESOPHAGEAL MANOMETRY (EM);  Surgeon: Manus Gunning, MD;  [facility];  Service: Gastroenterology;  Laterality: N/A;   ESOPHAGEAL MANOMETRY N/A 07/30/2019   Procedure: ESOPHAGEAL MANOMETRY (EM);  Surgeon: Yetta Flock, MD;  [facility];  Service: Gastroenterology;  Laterality: N/A;   ESOPHAGOGASTRODUODENOSCOPY (EGD) WITH PROPOFOL N/A 01/21/2020   Procedure: ESOPHAGOGASTRODUODENOSCOPY (EGD) WITH PROPOFOL;  Surgeon: Lavena Bullion, DO;  [facility];  Service: Gastroenterology;  Laterality: N/A;   FRACTURE SURGERY  ~ 2005   nose   KNEE ARTHROSCOPY  09/04/2012   Procedure: ARTHROSCOPY KNEE;  Surgeon: Gearlean Alf, MD;  [facility];  Service: Orthopedics;  Laterality: Right;  right knee arthroscopy with medial and lateral meniscus debridement   MELANOMA EXCISION Right 10/13/2016   right side of neck   MOUTH SURGERY  2004   "bone replacement; had cadavear bones put in; face was collapsing"   NASAL SEPTUM SURGERY  ~ [place] Right 07/07/2013   Procedure: RIGHT TOTAL KNEE ARTHROPLASTY;  Surgeon: Gearlean Alf, MD;  [facility];  Service: Orthopedics;  Laterality: Right;   TRANSORAL INCISIONLESS FUNDOPLICATION N/A 9/76/7341   Procedure: TRANSORAL INCISIONLESS FUNDOPLICATION;  Surgeon: Lavena Bullion, DO;  [facility];  Service: Gastroenterology;  Laterality: N/A;   UPPER GASTROINTESTINAL ENDOSCOPY      Current Medications: Current Meds  Medication Sig   aspirin EC 81 MG tablet Take 1 tablet (81 mg total) by mouth daily.   atorvastatin (LIPITOR) 80 MG tablet TAKE 1 TABLET BY MOUTH EVERY DAY   BLINK TEARS  0.25 % SOLN Place 1 drop into both eyes 3 (three) times daily as needed (dry/irritated eyes.).   calcium-vitamin D (OSCAL WITH D) 500-200 MG-UNIT tablet Take 1 tablet by mouth 2 (two) times daily.   CARAFATE 1 GM/10ML suspension TAKE 10 MLS (1 G TOTAL) BY MOUTH EVERY 6 (SIX) HOURS AS NEEDED.   cetirizine (ZYRTEC) 10 MG tablet Take 10 mg by mouth daily.    chlorthalidone (HYGROTON) 25 MG tablet TAKE 1 TABLET BY MOUTH EVERY DAY   CINNAMON PO Take 1,000 mg by mouth in the morning and at bedtime.   gabapentin (NEURONTIN) 300 MG capsule TAKE 2 CAPSULES BY MOUTH AT BEDTIME.   Insulin Pen Needle (NOVOFINE) 32G X 6 MM MISC USE TO INJECT VICTOZA DAILY. DX: E11.9   Lancet Device MISC  Use to check blood sugar twice daily.   Lancets (FREESTYLE) lancets Use to check blood sugar twice daily   metFORMIN (GLUCOPHAGE-XR) 500 MG 24 hr tablet TAKE 2 TABLETS BY MOUTH TWICE A DAY   methylcellulose (CITRUCEL) oral powder Use as directed daily   metoprolol succinate (TOPROL-XL) 100 MG 24 hr tablet TAKE 1 TABLET (100 MG TOTAL) BY MOUTH DAILY. TAKE WITH OR IMMEDIATELY FOLLOWING A MEAL.   Multiple Vitamins-Minerals (PRESERVISION AREDS PO) Take 1 tablet by mouth 2 (two) times daily. Reported on 12/23/2015   MYRBETRIQ 25 MG TB24 tablet TAKE 1 TABLET BY MOUTH EVERY DAY   nitroGLYCERIN (NITROSTAT) 0.4 MG SL tablet PLACE 1 TABLET (0.4 MG TOTAL) UNDER THE TONGUE EVERY 5 (FIVE) MINUTES AS NEEDED FOR CHEST PAIN   omeprazole (PRILOSEC OTC) 20 MG tablet Take 20 mg by mouth daily.   ONETOUCH ULTRA test strip USE TO CHECK BLOOD SUGAR TWICE DAILY   Probiotic Product (ALIGN PO) Take by mouth.   triamcinolone cream (KENALOG) 0.1 % Apply 1 application topically 2 (two) times daily.   valACYclovir (VALTREX) 1000 MG tablet Take 1 tablet (1,000 mg total) by mouth 3 (three) times daily.   venlafaxine XR (EFFEXOR-XR) 75 MG 24 hr capsule TAKE 1 CAPSULE BY MOUTH DAILY WITH BREAKFAST.   [DISCONTINUED] clonazePAM (KLONOPIN) 1 MG tablet TAKE 2  TABLETS BY MOUTH AT BEDTIME     Allergies:   Hydrocodone-acetaminophen, Percocet [oxycodone-acetaminophen], and Sulfa antibiotics   Social History   Socioeconomic History   Marital status: Married    Spouse name: Not on file   Number of children: 2   Years of education: college   Highest education level: Not on file  Occupational History   Occupation: Retired   Tobacco Use   Smoking status: Never   Smokeless tobacco: Never  Vaping Use   Vaping Use: Never used  Substance and Sexual Activity   Alcohol use: Yes    Alcohol/week: 1.0 standard drink    Types: 1 Glasses of wine per week    Comment: "occasionally drink wine"   Drug use: No   Sexual activity: Yes  Other Topics Concern   Not on file  Social History Narrative   Drinks 1 cup of coffee a day    Social Determinants of Health   Financial Resource Strain: Low Risk    Difficulty of Paying Living Expenses: Not hard at all  Food Insecurity: No Food Insecurity   Worried About Charity fundraiser in the Last Year: Never true   Arboriculturist in the Last Year: Never true  Transportation Needs: No Transportation Needs   Lack of Transportation (Medical): No   Lack of Transportation (Non-Medical): No  Physical Activity: Inactive   Days of Exercise per Week: 0 days   Minutes of Exercise per Session: 0 min  Stress: No Stress Concern Present   Feeling of Stress : Not at all  Social Connections: Not on file     Family History: The patient's family history includes Breast cancer (age of onset: 3) in her mother; Cancer - Colon (age of onset: 35) in her sister; Colon cancer in her sister; Dementia in her sister; Diabetes in her sister; GER disease in her daughter; Heart disease in her brother; Heart disease (age of onset: 47) in her father; Hypertension in her brother, daughter, and sister. There is no history of Heart attack, Stroke, Esophageal cancer, Rectal cancer, or Stomach cancer.  ROS:   Please see the history of  present illness.    (+) hallucinations  (+) restless legs (+) chest pains (+) word slurring  (+) palpitations all other systems reviewed and are negative.  EKGs/Labs/Other Studies Reviewed:    The following studies were reviewed today: LEXISCAN STRESS TEST 01/22:  Nuclear stress EF: 60%. There was no ST segment deviation noted during stress. The study is normal. The left ventricular ejection fraction is normal (55-65%). This is a low risk study.   Normal stress nuclear study with no ischemia or infarction.  Gated ejection fraction 60% with normal wall motion.  ECHO 01/22:  IMPRESSIONS:   1. Left ventricular ejection fraction, by estimation, is 55 to 60%. The  left ventricle has normal function. The left ventricle has no regional  wall motion abnormalities. There is mild left ventricular hypertrophy.  Left ventricular diastolic parameters  are consistent with Grade II diastolic dysfunction (pseudonormalization).   2. Right ventricular systolic function is mildly reduced. The right  ventricular size is normal. There is mildly elevated pulmonary artery  systolic pressure. The estimated right ventricular systolic pressure is  16.1 mmHg.   3. Left atrial size was mildly dilated.   4. The mitral valve is grossly normal. Mild mitral valve regurgitation.  No evidence of mitral stenosis.   5. The aortic valve is grossly normal. There is mild calcification of the  aortic valve. There is moderate thickening of the aortic valve. Aortic  valve regurgitation is not visualized. No aortic stenosis is present.   6. The inferior vena cava is normal in size with greater than 50%  respiratory variability, suggesting right atrial pressure of 3 mmHg.   Echo 0960 -EF 45% diastolic dysfunction with elevated left atrial filling pressures PA pressures 31, unchanged from prior  EKG:  09/22 : no EKG was ordered today. 01/22: sinus rhythm 64 artifact  Recent Labs: 02/08/2021: ALT 19; BUN 27;  Creatinine, Ser 0.96; Potassium 4.0; Sodium 141  Recent Lipid Panel    Component Value Date/Time   CHOL 128 02/08/2021 0826   TRIG 93.0 02/08/2021 0826   HDL 50.70 02/08/2021 0826   CHOLHDL 3 02/08/2021 0826   VLDL 18.6 02/08/2021 0826   LDLCALC 59 02/08/2021 0826       Physical Exam:    VS:  BP 134/70   Pulse 66   Ht 5' 2.5" (1.588 m)   Wt 147 lb 6.4 oz (66.9 kg)   SpO2 99%   BMI 26.53 kg/m     Wt Readings from Last 3 Encounters:  08/04/21 147 lb 6.4 oz (66.9 kg)  07/21/21 146 lb 8 oz (66.5 kg)  06/28/21 145 lb (65.8 kg)     GEN:  Well nourished, well developed in no acute distress HEENT: Normal NECK: No JVD; No carotid bruits LYMPHATICS: No lymphadenopathy CARDIAC: RRR, no murmurs, rubs, gallops RESPIRATORY:  Clear to auscultation without rales, wheezing or rhonchi  ABDOMEN: Soft, non-tender, non-distended MUSCULOSKELETAL:  No edema; No deformity  SKIN: Warm and dry NEUROLOGIC:  Alert and oriented x 3 PSYCHIATRIC:  Normal affect   ASSESSMENT:    1. Paroxysmal A-fib ([place])   2. Coronary artery disease involving native coronary artery of native heart without angina pectoris   3. Palpitations   4. RLS (restless legs syndrome)   5. Pure hypercholesterolemia     PLAN:    CAD (coronary artery disease) Nuclear stress test as well as echocardiogram reassuring.  No ischemia.  Normal pump function.  Performed in January 2022. After bypass had subsequent angina had diagonal  stent placed.  RLS (restless legs syndrome) She is worried about coming off of the clonazepam.  After being off of it for 4 days she did have hallucinations.  Her primary care physician Dr. Diona Browner will be gradually weaning her off of this.  I tried to give her reassurance.  She is also noted about worsening of the restless legs.  Continue to work with primary team.  Paroxysmal A-fib [facility]) Is been years since we have seen any evidence of paroxysmal atrial fibrillation but now she is complaining  of occasional palpitations racing heartbeat at night.  We will go ahead and check a Zio patch monitor to ensure that she does not have any adverse arrhythmias.  Hyperlipidemia Currently excellent control with atorvastatin high intensity dose 80 mg once a day for medical management.  No myalgias.  LDL last checked 59.  Wonderful.  ALT 19 from outside labs.  Hemoglobin 9.5 creatinine 0.96  In order of problems listed above:   Coronary artery disease status post bypass -Had subsequent angina and had diagonal stent placed. -She has continued with high-dose aspirin because of prior stroke.   -She has been experiencing more shortness of breath/atypical chest pain, fatigue which could be her anginal equivalent.  Because of this we will check an echocardiogram to ensure proper structure and function of her heart and we will check nuclear stress test or Lexiscan stress test.  Disequilibrium -Prior cerebellar stroke.  Physical therapy previously.  She has fallen unfortunately.  Broke her arm previously.  Being careful.   Hyperlipidemia -Continue with high intensity dose of atorvastatin, LDL 47 excellent control.  No myalgias on atorvastatin.   Diastolic dysfunction with elevated left atrial filling pressures -Hydrochlorothiazide was added at 1 point then stopped subsequently. Now on chlorthalidone.   Continue with good blood pressure control.  Continue with exercise and conditioning efforts.  No changes made.    Diabetes with hypertension -Well-controlled.  Hemoglobin A1c 6.3.  Excellent management per Dr. Diona Browner   Obstructive sleep apnea -CPAP Dr. Radford [name], stable.   GERD, polyps -Procedure 3/21-esophageal transoral fundoplication.  Doing well.  Lost some weight.    FOLLOW UP IN 1 year      Medication Adjustments/Labs and Tests Ordered: Current medicines are reviewed at length with the patient today.  Concerns regarding medicines are outlined above.  Orders Placed This Encounter   Procedures   LONG TERM MONITOR (3-14 DAYS)    No orders of the defined types were placed in this encounter.   Patient Instructions  Medication Instructions:  The current medical regimen is effective;  continue present plan and medications.  *If you need a refill on your cardiac medications before your next appointment, please call your pharmacy*  Testing/Procedures: [place] Monitor Instructions  Your physician has requested you wear a ZIO patch monitor for 14 days.  This is a single patch monitor. [employer] supplies one patch monitor per enrollment. Additional stickers are not available. Please do not apply patch if you will be having a Nuclear Stress Test,  Echocardiogram, Cardiac CT, MRI, or Chest Xray during the period you would be wearing the  monitor. The patch cannot be worn during these tests. You cannot remove and re-apply the  ZIO XT patch monitor.  Your ZIO patch monitor will be mailed 3 day USPS to your address on file. It may take 3-5 days  to receive your monitor after you have been enrolled.  Once you have received your monitor, please review the enclosed instructions.  Your monitor  has already been registered assigning a specific monitor serial # to you.  Billing and Patient Assistance Program Information  We have supplied [employer] with any of your insurance information on file for billing purposes. [employer] offers a sliding scale Patient Assistance Program for patients that do not have  insurance, or whose insurance does not completely cover the cost of the ZIO monitor.  You must apply for the Patient Assistance Program to qualify for this discounted rate.  To apply, please call [employer] at [phone], select option 4, select option 2, ask to apply for  Patient Assistance Program. Theodore Demark will ask your household income, and how many people  are in your household. They will quote your out-of-pocket cost based on that information.  [employer] will also be able  to set up a 62-month, interest-free payment plan if needed.  Applying the monitor   Shave hair from upper left chest.  Hold abrader disc by orange tab. Rub abrader in 40 strokes over the upper left chest as  indicated in your monitor instructions.  Clean area with 4 enclosed alcohol pads. Let dry.  Apply patch as indicated in monitor instructions. Patch will be placed under collarbone on left  side of chest with arrow pointing upward.  Rub patch adhesive wings for 2 minutes. Remove white label marked "1". Remove the white  label marked "2". Rub patch adhesive wings for 2 additional minutes.  While looking in a mirror, press and release button in center of patch. A small green light will  flash 3-4 times. This will be your only indicator that the monitor has been turned on.  Do not shower for the first 24 hours. You may shower after the first 24 hours.  Press the button if you feel a symptom. You will hear a small click. Record Date, Time and  Symptom in the Patient Logbook.  When you are ready to remove the patch, follow instructions on the last 2 pages of Patient  Logbook. Stick patch monitor onto the last page of Patient Logbook.  Place Patient Logbook in the blue and white box. Use locking tab on box and tape box closed  securely. The blue and white box has prepaid postage on it. Please place it in the mailbox as  soon as possible. Your physician should have your test results approximately 7 days after the  monitor has been mailed back to [facility].  Call [place] at [phone] if you have questions regarding  your ZIO XT patch monitor. Call them immediately if you see an orange light blinking on your  monitor.  If your monitor falls off in less than 4 days, contact our Monitor department at [phone].  If your monitor becomes loose or falls off after 4 days call [employer] at [phone] for  suggestions on securing your monitor  Follow-Up: At [facility], you and your health needs are our priority.  As part of our continuing mission to provide you with exceptional heart care, we have created designated Provider Care Teams.  These Care Teams include your primary Cardiologist (physician) and Advanced Practice Providers (APPs -  Physician Assistants and Nurse Practitioners) who all work together to provide you with the care you need, when you need it.  We recommend signing up for the patient portal called "MyChart".  Sign up information is provided on this After Visit Summary.  MyChart is used to connect with patients for Virtual Visits (Telemedicine).  Patients are able to view lab/test  results, encounter notes, upcoming appointments, etc.  Non-urgent messages can be sent to your provider as well.   To learn more about what you can do with MyChart, go to [email or web address].    Your next appointment:   1 year(s)  The format for your next appointment:   In Person  Provider:   Candee Furbish, MD   Thank you for choosing [facility]!!      I,Jada Bradford,acting as a scribe for Morgan Furbish, MD.,have documented all relevant documentation on the behalf of Morgan Furbish, MD,as directed by  Morgan Furbish, MD while in the presence of Morgan Furbish, MD.  I, Morgan Furbish, MD, have reviewed all documentation for this visit. The documentation on 08/04/21 for the exam, diagnosis, procedures, and orders are all accurate and complete.  Signed, Morgan Furbish, MD  08/04/2021 9:40 AM    [facility]

## 2021-08-04 ENCOUNTER — Ambulatory Visit: Payer: Medicare PPO | Admitting: Cardiology

## 2021-08-04 ENCOUNTER — Ambulatory Visit (INDEPENDENT_AMBULATORY_CARE_PROVIDER_SITE_OTHER): Payer: Medicare PPO

## 2021-08-04 ENCOUNTER — Other Ambulatory Visit: Payer: Self-pay

## 2021-08-04 ENCOUNTER — Encounter: Payer: Self-pay | Admitting: Cardiology

## 2021-08-04 VITALS — BP 134/70 | HR 66 | Ht 62.5 in | Wt 147.4 lb

## 2021-08-04 DIAGNOSIS — R002 Palpitations: Secondary | ICD-10-CM

## 2021-08-04 DIAGNOSIS — I2510 Atherosclerotic heart disease of native coronary artery without angina pectoris: Secondary | ICD-10-CM | POA: Diagnosis not present

## 2021-08-04 DIAGNOSIS — I480 Paroxysmal atrial fibrillation: Secondary | ICD-10-CM

## 2021-08-04 DIAGNOSIS — G2581 Restless legs syndrome: Secondary | ICD-10-CM | POA: Diagnosis not present

## 2021-08-04 DIAGNOSIS — E785 Hyperlipidemia, unspecified: Secondary | ICD-10-CM | POA: Insufficient documentation

## 2021-08-04 DIAGNOSIS — E7800 Pure hypercholesterolemia, unspecified: Secondary | ICD-10-CM

## 2021-08-04 NOTE — Assessment & Plan Note (Signed)
She is worried about coming off of the clonazepam.  After being off of it for 4 days she did have hallucinations.  Her primary care physician Dr. Diona Browner will be gradually weaning her off of this.  I tried to give her reassurance.  She is also noted about worsening of the restless legs.  Continue to work with primary team.

## 2021-08-04 NOTE — Assessment & Plan Note (Signed)
Currently excellent control with atorvastatin high intensity dose 80 mg once a day for medical management.  No myalgias.  LDL last checked 59.  Wonderful.  ALT 19 from outside labs.  Hemoglobin 9.5 creatinine 0.96

## 2021-08-04 NOTE — Assessment & Plan Note (Signed)
Nuclear stress test as well as echocardiogram reassuring.  No ischemia.  Normal pump function.  Performed in January 2022. After bypass had subsequent angina had diagonal stent placed.

## 2021-08-04 NOTE — Progress Notes (Unsigned)
Enrolled patient for a 4 day Zio XT monitor to be mailed to patients home

## 2021-08-04 NOTE — Assessment & Plan Note (Signed)
Is been years since we have seen any evidence of paroxysmal atrial fibrillation but now she is complaining of occasional palpitations racing heartbeat at night.  We will go ahead and check a Zio patch monitor to ensure that she does not have any adverse arrhythmias.

## 2021-08-04 NOTE — Patient Instructions (Signed)
Medication Instructions:  The current medical regimen is effective;  continue present plan and medications.  *If you need a refill on your cardiac medications before your next appointment, please call your pharmacy*  Testing/Procedures: ZIO XT- Long Term Monitor Instructions  Your physician has requested you wear a ZIO patch monitor for 14 days.  This is a single patch monitor. [employer] supplies one patch monitor per enrollment. Additional stickers are not available. Please do not apply patch if you will be having a Nuclear Stress Test,  Echocardiogram, Cardiac CT, MRI, or Chest Xray during the period you would be wearing the  monitor. The patch cannot be worn during these tests. You cannot remove and re-apply the  ZIO XT patch monitor.  Your ZIO patch monitor will be mailed 3 day USPS to your address on file. It may take 3-5 days  to receive your monitor after you have been enrolled.  Once you have received your monitor, please review the enclosed instructions. Your monitor  has already been registered assigning a specific monitor serial # to you.  Billing and Patient Assistance Program Information  We have supplied [employer] with any of your insurance information on file for billing purposes. [employer] offers a sliding scale Patient Assistance Program for patients that do not have  insurance, or whose insurance does not completely cover the cost of the ZIO monitor.  You must apply for the Patient Assistance Program to qualify for this discounted rate.  To apply, please call [employer] at [phone], select option 4, select option 2, ask to apply for  Patient Assistance Program. [employer] will ask your household income, and how many people  are in your household. They will quote your out-of-pocket cost based on that information.  [employer] will also be able to set up a 12-month, interest-free payment plan if needed.  Applying the monitor   Shave hair from upper left chest.  Hold abrader disc  by orange tab. Rub abrader in 40 strokes over the upper left chest as  indicated in your monitor instructions.  Clean area with 4 enclosed alcohol pads. Let dry.  Apply patch as indicated in monitor instructions. Patch will be placed under collarbone on left  side of chest with arrow pointing upward.  Rub patch adhesive wings for 2 minutes. Remove white label marked "1". Remove the white  label marked "2". Rub patch adhesive wings for 2 additional minutes.  While looking in a mirror, press and release button in center of patch. A small green light will  flash 3-4 times. This will be your only indicator that the monitor has been turned on.  Do not shower for the first 24 hours. You may shower after the first 24 hours.  Press the button if you feel a symptom. You will hear a small click. Record Date, Time and  Symptom in the Patient Logbook.  When you are ready to remove the patch, follow instructions on the last 2 pages of Patient  Logbook. Stick patch monitor onto the last page of Patient Logbook.  Place Patient Logbook in the blue and white box. Use locking tab on box and tape box closed  securely. The blue and white box has prepaid postage on it. Please place it in the mailbox as  soon as possible. Your physician should have your test results approximately 7 days after the  monitor has been mailed back to [employer].  Call [employer] Customer Care at [phone] if you have questions regarding  your ZIO XT patch   monitor. Call them immediately if you see an orange light blinking on your  monitor.  If your monitor falls off in less than 4 days, contact our Monitor department at [phone].  If your monitor becomes loose or falls off after 4 days call [employer] at [phone] for  suggestions on securing your monitor  Follow-Up: At [facility], you and your health needs are our priority.  As part of our continuing mission to provide you with exceptional heart care, we have  created designated Provider Care Teams.  These Care Teams include your primary Cardiologist (physician) and Advanced Practice Providers (APPs -  Physician Assistants and Nurse Practitioners) who all work together to provide you with the care you need, when you need it.  We recommend signing up for the patient portal called "MyChart".  Sign up information is provided on this After Visit Summary.  MyChart is used to connect with patients for Virtual Visits (Telemedicine).  Patients are able to view lab/test results, encounter notes, upcoming appointments, etc.  Non-urgent messages can be sent to your provider as well.   To learn more about what you can do with MyChart, go to [email or web address].    Your next appointment:   1 year(s)  The format for your next appointment:   In Person  Provider:   Mark Skains, MD     Thank you for choosing [facility]!!    

## 2021-08-09 ENCOUNTER — Other Ambulatory Visit: Payer: Medicare PPO

## 2021-08-11 ENCOUNTER — Other Ambulatory Visit: Payer: Self-pay

## 2021-08-11 ENCOUNTER — Other Ambulatory Visit (INDEPENDENT_AMBULATORY_CARE_PROVIDER_SITE_OTHER): Payer: Medicare PPO

## 2021-08-11 DIAGNOSIS — R410 Disorientation, unspecified: Secondary | ICD-10-CM

## 2021-08-11 LAB — CBC WITH DIFFERENTIAL/PLATELET
Basophils Absolute: 0 K/uL (ref 0.0–0.1)
Basophils Relative: 0.4 % (ref 0.0–3.0)
Eosinophils Absolute: 0.6 K/uL (ref 0.0–0.7)
Eosinophils Relative: 9.4 % — ABNORMAL HIGH (ref 0.0–5.0)
HCT: 34.2 % — ABNORMAL LOW (ref 36.0–46.0)
Hemoglobin: 10.9 g/dL — ABNORMAL LOW (ref 12.0–15.0)
Lymphocytes Relative: 22.4 % (ref 12.0–46.0)
Lymphs Abs: 1.4 K/uL (ref 0.7–4.0)
MCHC: 31.9 g/dL (ref 30.0–36.0)
MCV: 86.1 fl (ref 78.0–100.0)
Monocytes Absolute: 0.7 K/uL (ref 0.1–1.0)
Monocytes Relative: 11 % (ref 3.0–12.0)
Neutro Abs: 3.6 K/uL (ref 1.4–7.7)
Neutrophils Relative %: 56.8 % (ref 43.0–77.0)
Platelets: 289 K/uL (ref 150.0–400.0)
RBC: 3.97 Mil/uL (ref 3.87–5.11)
RDW: 15.4 % (ref 11.5–15.5)
WBC: 6.4 K/uL (ref 4.0–10.5)

## 2021-08-11 LAB — COMPREHENSIVE METABOLIC PANEL WITH GFR
ALT: 14 U/L (ref 0–35)
AST: 16 U/L (ref 0–37)
Albumin: 4 g/dL (ref 3.5–5.2)
Alkaline Phosphatase: 61 U/L (ref 39–117)
BUN: 24 mg/dL — ABNORMAL HIGH (ref 6–23)
CO2: 33 meq/L — ABNORMAL HIGH (ref 19–32)
Calcium: 9.9 mg/dL (ref 8.4–10.5)
Chloride: 100 meq/L (ref 96–112)
Creatinine, Ser: 0.99 mg/dL (ref 0.40–1.20)
GFR: 54.28 mL/min — ABNORMAL LOW (ref >60.00)
Glucose, Bld: 170 mg/dL — ABNORMAL HIGH (ref 70–99)
Potassium: 4 meq/L (ref 3.5–5.1)
Sodium: 141 meq/L (ref 135–145)
Total Bilirubin: 0.4 mg/dL (ref 0.2–1.2)
Total Protein: 6.1 g/dL (ref 6.0–8.3)

## 2021-08-11 NOTE — Progress Notes (Signed)
No critical labs need to be addressed urgently. We will discuss labs in detail at upcoming office visit.   

## 2021-08-15 DIAGNOSIS — I2510 Atherosclerotic heart disease of native coronary artery without angina pectoris: Secondary | ICD-10-CM | POA: Diagnosis not present

## 2021-08-15 DIAGNOSIS — I10 Essential (primary) hypertension: Secondary | ICD-10-CM | POA: Diagnosis not present

## 2021-08-15 DIAGNOSIS — D6869 Other thrombophilia: Secondary | ICD-10-CM | POA: Diagnosis not present

## 2021-08-15 DIAGNOSIS — F324 Major depressive disorder, single episode, in partial remission: Secondary | ICD-10-CM | POA: Diagnosis not present

## 2021-08-15 DIAGNOSIS — I4891 Unspecified atrial fibrillation: Secondary | ICD-10-CM | POA: Diagnosis not present

## 2021-08-15 DIAGNOSIS — E785 Hyperlipidemia, unspecified: Secondary | ICD-10-CM | POA: Diagnosis not present

## 2021-08-15 DIAGNOSIS — G2581 Restless legs syndrome: Secondary | ICD-10-CM | POA: Diagnosis not present

## 2021-08-15 DIAGNOSIS — K219 Gastro-esophageal reflux disease without esophagitis: Secondary | ICD-10-CM | POA: Diagnosis not present

## 2021-08-15 DIAGNOSIS — E1165 Type 2 diabetes mellitus with hyperglycemia: Secondary | ICD-10-CM | POA: Diagnosis not present

## 2021-08-16 ENCOUNTER — Encounter: Payer: Self-pay | Admitting: Family Medicine

## 2021-08-16 ENCOUNTER — Other Ambulatory Visit: Payer: Self-pay

## 2021-08-16 ENCOUNTER — Ambulatory Visit: Payer: Medicare PPO | Admitting: Family Medicine

## 2021-08-16 VITALS — BP 132/64 | HR 60 | Temp 97.8°F | Ht 63.0 in | Wt 149.0 lb

## 2021-08-16 DIAGNOSIS — D509 Iron deficiency anemia, unspecified: Secondary | ICD-10-CM | POA: Insufficient documentation

## 2021-08-16 DIAGNOSIS — N3281 Overactive bladder: Secondary | ICD-10-CM | POA: Diagnosis not present

## 2021-08-16 DIAGNOSIS — E1159 Type 2 diabetes mellitus with other circulatory complications: Secondary | ICD-10-CM

## 2021-08-16 DIAGNOSIS — G4700 Insomnia, unspecified: Secondary | ICD-10-CM | POA: Diagnosis not present

## 2021-08-16 DIAGNOSIS — F1320 Sedative, hypnotic or anxiolytic dependence, uncomplicated: Secondary | ICD-10-CM

## 2021-08-16 DIAGNOSIS — G2581 Restless legs syndrome: Secondary | ICD-10-CM | POA: Diagnosis not present

## 2021-08-16 DIAGNOSIS — D649 Anemia, unspecified: Secondary | ICD-10-CM | POA: Diagnosis not present

## 2021-08-16 LAB — IBC + FERRITIN
Ferritin: 6.8 ng/mL — ABNORMAL LOW (ref 10.0–291.0)
Iron: 39 ug/dL — ABNORMAL LOW (ref 42–145)
Saturation Ratios: 7.7 % — ABNORMAL LOW (ref 20.0–50.0)
TIBC: 506.8 ug/dL — ABNORMAL HIGH (ref 250.0–450.0)
Transferrin: 362 mg/dL — ABNORMAL HIGH (ref 212.0–360.0)

## 2021-08-16 LAB — VITAMIN B12: Vitamin B-12: 327 pg/mL (ref 211–911)

## 2021-08-16 MED ORDER — ROPINIROLE HCL 0.25 MG PO TABS: 0.2500 mg | ORAL_TABLET | Freq: Every day | ORAL | 11 refills | Status: DC

## 2021-08-16 NOTE — Assessment & Plan Note (Signed)
Chronic, good control  She is no longer on the victoza given worsened her chronic diarrhea. She continue the metformin 2 tabs BID.

## 2021-08-16 NOTE — Assessment & Plan Note (Signed)
When she is back from her trip next month she will start with slow wean off clonazepam.  for now continue 1 mg qHS and then  Will try every other 0.5 mg daily alternating with 1 mg daily x 1-2 week then 0.5 mg daily x1-2 week, then every other day x 1-2 week then off.

## 2021-08-16 NOTE — Patient Instructions (Addendum)
Continue 1 mg  daily at bedtime of clonazepam for now until back from your trip.  Please stop at the lab to have labs drawn. Okay to increase Mrbtriq to 50 mg at bedtime.. call when new prescription needed.    Next month when you are ready.. start ropinerole at bedtime and decrease clonazepam to 1/2 tab/1 tab every other day for 2 weeks . The go down to 1/2 tab daily.  Wear Zio monitor when weaning starts.   Send me a message with an update. Call if at any time restless leg symptoms restart for an increase in ropinirole.

## 2021-08-16 NOTE — Progress Notes (Signed)
Patient ID: Morgan [name], female    DOB: October 16, 1941, 80 y.o.   MRN: [id number]  This visit was conducted in person.    CC:  Chief Complaint  Patient presents with   Diabetes Follow up    6 month     Subjective:   HPI: Morgan [name] is a 80 y.o. female presenting on 08/16/2021 for Diabetes Follow up (6 month/)  Diabetes:   A1C in office today 6.8  She is no longer on the victoza given worsened her chronic diarrhea. She continue the metformin 2 tabs BID. 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   She reports she is back to her baseline following withdrawal when out of benzo. No seizures, no confusion, no neuro changes.  MR brain wo contrast: Reviewed. IMPRESSION: 1. No evidence of acute intracranial abnormality. Specifically, no acute infarct. 2. 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 3. Similar mild for age chronic microvascular ischemic disease and atrophy. Remote left cerebellar lacunar infarct.   BMET and cbc reviewed. Anemia improving.. new onset in 01/2020. Needs further eval.   08/2020 colonoscopy showed polyps.  No blood loss.  She has not noted any SE to Myrbetriq, but still getting up once nightly to urinate. Requests increase in dose. Relevant past medical, surgical, family and social history reviewed and updated as indicated. Interim medical history since our last visit reviewed. Allergies and medications reviewed and updated. Outpatient Medications Prior to Visit  Medication Sig Dispense Refill   aspirin EC 81 MG tablet Take 1 tablet (81 mg  total) by mouth daily.     atorvastatin (LIPITOR) 80 MG tablet TAKE 1 TABLET BY MOUTH EVERY DAY 90 tablet 1   BLINK TEARS 0.25 % SOLN Place 1 drop into both eyes 3 (three) times daily as needed (dry/irritated eyes.).     calcium-vitamin D (OSCAL WITH D) 500-200 MG-UNIT tablet Take 1 tablet by mouth 2 (two) times daily.     CARAFATE 1 GM/10ML suspension TAKE 10 MLS (1 G TOTAL) BY MOUTH EVERY 6 (SIX) HOURS AS NEEDED. 420 mL 3   cetirizine (ZYRTEC) 10 MG tablet Take 10 mg by mouth daily.      chlorthalidone (HYGROTON) 25 MG tablet TAKE 1 TABLET BY MOUTH EVERY DAY 90 tablet 3   CINNAMON PO Take 1,000 mg by mouth in the morning and at bedtime.     clonazePAM (KLONOPIN) 1 MG tablet TAKE 2 TABLETS BY MOUTH AT BEDTIME 60 tablet 0   gabapentin (NEURONTIN) 300 MG capsule TAKE 2 CAPSULES BY MOUTH AT BEDTIME. 180 capsule 1   Insulin Pen Needle (NOVOFINE) 32G X 6 MM MISC USE TO INJECT VICTOZA DAILY. DX: E11.9 100 each 3   Lancet Device MISC Use to check blood sugar twice daily. 1 each 0   Lancets (FREESTYLE) lancets Use to check blood sugar twice daily 100 each 11   metFORMIN (GLUCOPHAGE-XR) 500 MG 24 hr tablet TAKE 2 TABLETS BY MOUTH TWICE A DAY 360 tablet 3   methylcellulose (CITRUCEL) oral powder Use as directed daily     metoprolol succinate (TOPROL-XL) 100 MG 24 hr tablet TAKE 1 TABLET (100 MG TOTAL) BY MOUTH DAILY. TAKE WITH  OR IMMEDIATELY FOLLOWING A MEAL. 90 tablet 3   Multiple Vitamins-Minerals (PRESERVISION AREDS PO) Take 1 tablet by mouth 2 (two) times daily. Reported on 12/23/2015     MYRBETRIQ 25 MG TB24 tablet TAKE 1 TABLET BY MOUTH EVERY DAY 90 tablet 3   nitroGLYCERIN (NITROSTAT) 0.4 MG SL tablet PLACE 1 TABLET (0.4 MG TOTAL) UNDER THE TONGUE EVERY 5 (FIVE) MINUTES AS NEEDED FOR CHEST PAIN 25 tablet 0   omeprazole (PRILOSEC OTC) 20 MG tablet Take 20 mg by mouth daily.     ONETOUCH ULTRA test strip USE TO CHECK BLOOD SUGAR TWICE DAILY 150 strip 3   Probiotic Product (ALIGN PO) Take by mouth.      simethicone (MYLICON) 40 OV/5.6EP drops Take 1.2 mLs (80 mg total) by mouth 4 (four) times daily as needed for flatulence. 30 mL 0   triamcinolone cream (KENALOG) 0.1 % Apply 1 application topically 2 (two) times daily. 30 g 0   valACYclovir (VALTREX) 1000 MG tablet Take 1 tablet (1,000 mg total) by mouth 3 (three) times daily. 21 tablet 0   venlafaxine XR (EFFEXOR-XR) 75 MG 24 hr capsule TAKE 1 CAPSULE BY MOUTH DAILY WITH BREAKFAST. 90 capsule 1   No facility-administered medications prior to visit.     Per HPI unless specifically indicated in ROS section below Review of Systems  Constitutional:  Negative for fatigue and fever.  HENT:  Negative for congestion.   Eyes:  Negative for pain.  Respiratory:  Negative for cough and shortness of breath.   Cardiovascular:  Negative for chest pain, palpitations and leg swelling.  Gastrointestinal:  Negative for abdominal pain and diarrhea.  Genitourinary:  Positive for frequency. Negative for dysuria and vaginal bleeding.  Musculoskeletal:  Negative for back pain.  Neurological:  Negative for syncope, light-headedness and headaches.  Psychiatric/Behavioral:  Positive for sleep disturbance. Negative for dysphoric mood. The patient is nervous/anxious.   Objective:  There were no vitals taken for this visit.  Wt Readings from Last 3 Encounters:  08/04/21 147 lb 6.4 oz (66.9 kg)  07/21/21 146 lb 8 oz (66.5 kg)  06/28/21 145 lb (65.8 kg)      Physical Exam Constitutional:      General: She is not in acute distress.    Appearance: Normal appearance. She is well-developed. She is not ill-appearing or toxic-appearing.  HENT:     Head: Normocephalic.     Right Ear: Hearing, tympanic membrane, ear canal and external ear normal. Tympanic membrane is not erythematous, retracted or bulging.     Left Ear: Hearing, tympanic membrane, ear canal and external ear normal. Tympanic membrane is not erythematous, retracted or bulging.     Nose: No mucosal  edema or rhinorrhea.     Right Sinus: No maxillary sinus tenderness or frontal sinus tenderness.     Left Sinus: No maxillary sinus tenderness or frontal sinus tenderness.     Mouth/Throat:     Pharynx: Uvula midline.  Eyes:     General: Lids are normal. Lids are everted, no foreign bodies appreciated.     Conjunctiva/sclera: Conjunctivae normal.     Pupils: Pupils are equal, round, and reactive to light.  Neck:     Thyroid: No thyroid mass or thyromegaly.     Vascular: No carotid bruit.     Trachea: Trachea normal.  Cardiovascular:     Rate and Rhythm: Normal rate and regular rhythm.     Pulses: Normal pulses.     Heart sounds: Normal heart sounds,  S1 normal and S2 normal. No murmur heard.   No friction rub. No gallop.  Pulmonary:     Effort: Pulmonary effort is normal. No tachypnea or respiratory distress.     Breath sounds: Normal breath sounds. No decreased breath sounds, wheezing, rhonchi or rales.  Abdominal:     General: Bowel sounds are normal.     Palpations: Abdomen is soft.     Tenderness: There is no abdominal tenderness.  Musculoskeletal:     Cervical back: Normal range of motion and neck supple.  Skin:    General: Skin is warm and dry.     Findings: No rash.  Neurological:     Mental Status: She is alert.  Psychiatric:        Mood and Affect: Mood is not anxious or depressed.        Speech: Speech normal.        Behavior: Behavior normal. Behavior is cooperative.        Thought Content: Thought content normal.        Judgment: Judgment normal.      Results for orders placed or performed in visit on 08/11/21  Comprehensive metabolic panel  Result Value Ref Range   Sodium 141 135 - 145 mEq/L   Potassium 4.0 3.5 - 5.1 mEq/L   Chloride 100 96 - 112 mEq/L   CO2 33 (H) 19 - 32 mEq/L   Glucose, Bld 170 (H) 70 - 99 mg/dL   BUN 24 (H) 6 - 23 mg/dL   Creatinine, Ser 0.99 0.40 - 1.20 mg/dL   Total Bilirubin 0.4 0.2 - 1.2 mg/dL   Alkaline Phosphatase 61 39 - 117  U/L   AST 16 0 - 37 U/L   ALT 14 0 - 35 U/L   Total Protein 6.1 6.0 - 8.3 g/dL   Albumin 4.0 3.5 - 5.2 g/dL   GFR 54.28 (L) >60.00 mL/min   Calcium 9.9 8.4 - 10.5 mg/dL  CBC with Differential/Platelet  Result Value Ref Range   WBC 6.4 4.0 - 10.5 K/uL   RBC 3.97 3.87 - 5.11 Mil/uL   Hemoglobin 10.9 (L) 12.0 - 15.0 g/dL   HCT 34.2 (L) 36.0 - 46.0 %   MCV 86.1 78.0 - 100.0 fl   MCHC 31.9 30.0 - 36.0 g/dL   RDW 15.4 11.5 - 15.5 %   Platelets 289.0 150.0 - 400.0 K/uL   Neutrophils Relative % 56.8 43.0 - 77.0 %   Lymphocytes Relative 22.4 12.0 - 46.0 %   Monocytes Relative 11.0 3.0 - 12.0 %   Eosinophils Relative 9.4 (H) 0.0 - 5.0 %   Basophils Relative 0.4 0.0 - 3.0 %   Neutro Abs 3.6 1.4 - 7.7 K/uL   Lymphs Abs 1.4 0.7 - 4.0 K/uL   Monocytes Absolute 0.7 0.1 - 1.0 K/uL   Eosinophils Absolute 0.6 0.0 - 0.7 K/uL   Basophils Absolute 0.0 0.0 - 0.1 K/uL    This visit occurred during the SARS-CoV-2 public health emergency.  Safety protocols were in place, including screening questions prior to the visit, additional usage of staff PPE, and extensive cleaning of exam room while observing appropriate contact time as indicated for disinfecting solutions.   COVID 19 screen:  No recent travel or known exposure to COVID19 The patient denies respiratory symptoms of COVID 19 at this time. The importance of social distancing was discussed today.   Assessment and Plan Problem List Items Addressed This Visit     RLS (restless legs  syndrome) (Chronic)    Will start requip to treat this as she was using clonazepam prior to treat. 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 She continue the metformin 2 tabs BID.      Anemia    Unclear etiology.  Possibly due to polyps seen on colonoscopy in  End of 2021.. treated.  Noted first in  01/2020, improved.  Eval with labs.       Relevant Orders   IBC + Ferritin (Completed)   Vitamin B12 (Completed)   Benzodiazepine dependence ([place])    When she is back from her trip next month she will start with slow wean off clonazepam.  for now continue 1 mg qHS and then  Will try every other 0.5 mg daily alternating with 1 mg daily x 1-2 week then 0.5 mg daily x1-2 week, then every other day x 1-2 week then off.      Insomnia   OAB (overactive bladder)    Chronic, inadequate control.   Interrupting sleep... will try increase dose to 50 mg qHS.       Meds ordered this encounter  Medications   rOPINIRole (REQUIP) 0.25 MG tablet    Sig: Take 1 tablet (0.25 mg total) by mouth at bedtime.    Dispense:  30 tablet    Refill:  11   Orders Placed This Encounter  Procedures   IBC + Ferritin   Vitamin B12       Eliezer Lofts, MD

## 2021-08-16 NOTE — Assessment & Plan Note (Signed)
Will start requip to treat this as she was using clonazepam prior to treat. Will likely need to titrate up

## 2021-08-16 NOTE — Assessment & Plan Note (Signed)
Chronic, inadequate control.   Interrupting sleep... will try increase dose to 50 mg qHS.

## 2021-08-16 NOTE — Assessment & Plan Note (Signed)
Unclear etiology.  Possibly due to polyps seen on colonoscopy in  End of 2021.. treated.  Noted first in 01/2020, improved.  Eval with labs.

## 2021-08-21 ENCOUNTER — Other Ambulatory Visit: Payer: Self-pay | Admitting: Cardiology

## 2021-08-22 ENCOUNTER — Encounter: Payer: Self-pay | Admitting: Family Medicine

## 2021-08-29 ENCOUNTER — Telehealth: Payer: Self-pay | Admitting: Family Medicine

## 2021-08-29 NOTE — Telephone Encounter (Signed)
Pt husband returning call

## 2021-09-19 DIAGNOSIS — R002 Palpitations: Secondary | ICD-10-CM

## 2021-09-19 DIAGNOSIS — I480 Paroxysmal atrial fibrillation: Secondary | ICD-10-CM

## 2021-09-20 ENCOUNTER — Other Ambulatory Visit: Payer: Self-pay

## 2021-09-20 ENCOUNTER — Encounter: Payer: Self-pay | Admitting: Family

## 2021-09-20 ENCOUNTER — Ambulatory Visit: Payer: Medicare PPO | Admitting: Family

## 2021-09-20 VITALS — BP 142/84 | HR 68 | Temp 95.7°F | Ht 62.99 in | Wt 149.8 lb

## 2021-09-20 DIAGNOSIS — R300 Dysuria: Secondary | ICD-10-CM | POA: Diagnosis not present

## 2021-09-20 DIAGNOSIS — R350 Frequency of micturition: Secondary | ICD-10-CM

## 2021-09-20 MED ORDER — NITROFURANTOIN MONOHYD MACRO 100 MG PO CAPS: 100.0000 mg | ORAL_CAPSULE | Freq: Two times a day (BID) | ORAL | 0 refills | Status: DC

## 2021-09-20 NOTE — Progress Notes (Signed)
Acute Office Visit  Subjective:    Patient ID: Morgan [name], female    DOB: 1941/04/26, 80 y.o.   MRN: [id number]  Chief Complaint  Patient presents with   Urinary Urgency    Pt has urinary urgency but cant get any urine out. Pt is also having pain and pressure in her lower abdomen.     HPI Patient is in today with c/o urinary frequency, urgency, and pelvic pain. She has a history of UTIs and symptoms are very similar. She is unable to produce a urine at the present time. No fever or chills.   Past Medical History:  Diagnosis Date   Angina    Arthritis    Atrial fibrillation ([place])    ASPIRIN FOR BLOOD THINNER   Atypical mole 09/22/2016   Bulging discs    lumbar    Complication of anesthesia    pt states has choking sensation with ET tube    Coronary artery disease    Depression    Diabetes mellitus    diet controlled/on meds   Family history of breast cancer    Family history of colon cancer    Fibromyalgia    GERD (gastroesophageal reflux disease)    H/O hiatal hernia    Headache(784.0)    "recurring"   High cholesterol    History of colon polyps    Hyperlipidemia    Hypertension    Jackhammer esophagus    Melanoma ([place])    melanoma   Migraines    "til ~ 1980"   PONV (postoperative nausea and vomiting)    Restless leg syndrome    Sciatic nerve pain    "from pinched nerve"   Sleep apnea    uses CPAP   Stroke ([place]) 2014   no deficits   Weakness of right side of body    "I've had PT for it; they don't know what it's from".  CORTISONE INJECTION INTO BACK 08/30/12    Past Surgical History:  Procedure Laterality Date   52 HOUR [place] STUDY N/A 12/29/2015   Procedure: 24 HOUR PH STUDY;  Surgeon: Manus Gunning, MD;  [facility];  Service: Gastroenterology;  Laterality: N/A;   CARPAL TUNNEL RELEASE Left 07/02/2015   Procedure: CARPAL TUNNEL RELEASE;  Surgeon: Frederik Pear, MD;  Location: [facility];  Service: Orthopedics;   Laterality: Left;   CATARACT EXTRACTION, BILATERAL Bilateral    Oct and Nov 2017   COLONOSCOPY  06/30/2019   CORONARY ANGIOPLASTY WITH STENT PLACEMENT  06/2011   "1"   CORONARY ARTERY BYPASS GRAFT  2005   CABG X 2   DILATION AND CURETTAGE OF UTERUS     "more than once"   ELBOW ARTHROSCOPY Left 07/02/2015   Procedure: ARTHROSCOPY LEFT ELBOW WITH DEBRIDEMENT AND REMOVAL LOOSE BODY;  Surgeon: Frederik Pear, MD;  Location: [facility];  Service: Orthopedics;  Laterality: Left;   ESOPHAGEAL DILATION  01/21/2020   Procedure: ESOPHAGEAL DILATION;  Surgeon: Lavena Bullion, DO;  [facility];  Service: Gastroenterology;;   ESOPHAGEAL MANOMETRY N/A 12/29/2015   Procedure: ESOPHAGEAL MANOMETRY (EM);  Surgeon: Manus Gunning, MD;  [facility];  Service: Gastroenterology;  Laterality: N/A;   ESOPHAGEAL MANOMETRY N/A 07/30/2019   Procedure: ESOPHAGEAL MANOMETRY (EM);  Surgeon: Yetta Flock, MD;  [facility];  Service: Gastroenterology;  Laterality: N/A;   ESOPHAGOGASTRODUODENOSCOPY (EGD) WITH PROPOFOL N/A 01/21/2020   Procedure: ESOPHAGOGASTRODUODENOSCOPY (EGD) WITH PROPOFOL;  Surgeon: Lavena Bullion, DO;  [facility];  Service: Gastroenterology;  Laterality: N/A;   FRACTURE SURGERY  ~ 2005   nose   KNEE ARTHROSCOPY  09/04/2012   Procedure: ARTHROSCOPY KNEE;  Surgeon: Gearlean Alf, MD;  [facility];  Service: Orthopedics;  Laterality: Right;  right knee arthroscopy with medial and lateral meniscus debridement   MELANOMA EXCISION Right 10/13/2016   right side of neck   MOUTH SURGERY  2004   "bone replacement; had cadavear bones put in; face was collapsing"   NASAL SEPTUM SURGERY  ~ [place] Right 07/07/2013   Procedure: RIGHT TOTAL KNEE ARTHROPLASTY;  Surgeon: Gearlean Alf, MD;  [facility];  Service: Orthopedics;  Laterality: Right;   TRANSORAL INCISIONLESS FUNDOPLICATION N/A 5/78/4696    Procedure: TRANSORAL INCISIONLESS FUNDOPLICATION;  Surgeon: Lavena Bullion, DO;  [facility];  Service: Gastroenterology;  Laterality: N/A;   UPPER GASTROINTESTINAL ENDOSCOPY      Family History  Problem Relation Age of Onset   Breast cancer Mother 2   Heart disease Father 62       sudden onset due to CAD   Hypertension Sister    Dementia Sister    Diabetes Sister    Cancer - Colon Sister 63   Colon cancer Sister    GER disease Daughter    Hypertension Daughter    Heart disease Brother    Hypertension Brother    Heart attack Neg Hx    Stroke Neg Hx    Esophageal cancer Neg Hx    Rectal cancer Neg Hx    Stomach cancer Neg Hx     Social History   Socioeconomic History   Marital status: Married    Spouse name: Not on file   Number of children: 2   Years of education: college   Highest education level: Not on file  Occupational History   Occupation: Retired   Tobacco Use   Smoking status: Never   Smokeless tobacco: Never  Vaping Use   Vaping Use: Never used  Substance and Sexual Activity   Alcohol use: Yes    Alcohol/week: 1.0 standard drink    Types: 1 Glasses of wine per week    Comment: "occasionally drink wine"   Drug use: No   Sexual activity: Yes  Other Topics Concern   Not on file  Social History Narrative   Drinks 1 cup of coffee a day    Social Determinants of Health   Financial Resource Strain: Low Risk    Difficulty of Paying Living Expenses: Not hard at all  Food Insecurity: No Food Insecurity   Worried About Charity fundraiser in the Last Year: Never true   Arboriculturist in the Last Year: Never true  Transportation Needs: No Transportation Needs   Lack of Transportation (Medical): No   Lack of Transportation (Non-Medical): No  Physical Activity: Inactive   Days of Exercise per Week: 0 days   Minutes of Exercise per Session: 0 min  Stress: No Stress Concern Present   Feeling of Stress : Not at all  Social Connections: Not  on file  Intimate Partner Violence: Not At Risk   Fear of Current or Ex-Partner: No   Emotionally Abused: No   Physically Abused: No   Sexually Abused: No    Outpatient Medications Prior to Visit  Medication Sig Dispense Refill   Alum & Mag Hydroxide-Simeth (MAALOX REGULAR STRENGTH PO) Take by mouth daily. Patient takes 1 capful  daily of the powder formulation.     aspirin EC 81 MG tablet Take 1 tablet (81 mg total) by mouth daily.     atorvastatin (LIPITOR) 80 MG tablet TAKE 1 TABLET BY MOUTH EVERY DAY 90 tablet 3   BLINK TEARS 0.25 % SOLN Place 1 drop into both eyes 3 (three) times daily as needed (dry/irritated eyes.).     calcium-vitamin D (OSCAL WITH D) 500-200 MG-UNIT tablet Take 1 tablet by mouth 2 (two) times daily.     CARAFATE 1 GM/10ML suspension TAKE 10 MLS (1 G TOTAL) BY MOUTH EVERY 6 (SIX) HOURS AS NEEDED. 420 mL 3   cetirizine (ZYRTEC) 10 MG tablet Take 10 mg by mouth daily.      chlorthalidone (HYGROTON) 25 MG tablet TAKE 1 TABLET BY MOUTH EVERY DAY 90 tablet 3   CINNAMON PO Take 1,000 mg by mouth in the morning and at bedtime.     clonazePAM (KLONOPIN) 1 MG tablet Take 1 mg by mouth at bedtime.     gabapentin (NEURONTIN) 300 MG capsule TAKE 2 CAPSULES BY MOUTH AT BEDTIME. 180 capsule 1   Insulin Pen Needle (NOVOFINE) 32G X 6 MM MISC USE TO INJECT VICTOZA DAILY. DX: E11.9 100 each 3   Lancet Device MISC Use to check blood sugar twice daily. 1 each 0   Lancets (FREESTYLE) lancets Use to check blood sugar twice daily 100 each 11   metFORMIN (GLUCOPHAGE-XR) 500 MG 24 hr tablet TAKE 2 TABLETS BY MOUTH TWICE A DAY 360 tablet 3   methylcellulose (CITRUCEL) oral powder Use as directed daily     Methylcellulose, Laxative, (CITRUCEL PO) Take by mouth daily. Patient reports taking 1 po daily     metoprolol succinate (TOPROL-XL) 100 MG 24 hr tablet TAKE 1 TABLET (100 MG TOTAL) BY MOUTH DAILY. TAKE WITH OR IMMEDIATELY FOLLOWING A MEAL. 90 tablet 3   Multiple Vitamins-Minerals  (PRESERVISION AREDS PO) Take 1 tablet by mouth 2 (two) times daily. Reported on 12/23/2015     MYRBETRIQ 25 MG TB24 tablet TAKE 1 TABLET BY MOUTH EVERY DAY 90 tablet 3   nitroGLYCERIN (NITROSTAT) 0.4 MG SL tablet PLACE 1 TABLET (0.4 MG TOTAL) UNDER THE TONGUE EVERY 5 (FIVE) MINUTES AS NEEDED FOR CHEST PAIN 25 tablet 0   omeprazole (PRILOSEC OTC) 20 MG tablet Take 20 mg by mouth daily.     ONETOUCH ULTRA test strip USE TO CHECK BLOOD SUGAR TWICE DAILY 150 strip 3   Probiotic Product (ALIGN PO) Take by mouth.     rOPINIRole (REQUIP) 0.25 MG tablet Take 1 tablet (0.25 mg total) by mouth at bedtime. 30 tablet 11   triamcinolone cream (KENALOG) 0.1 % Apply 1 application topically 2 (two) times daily. 30 g 0   valACYclovir (VALTREX) 1000 MG tablet Take 1 tablet (1,000 mg total) by mouth 3 (three) times daily. 21 tablet 0   venlafaxine XR (EFFEXOR-XR) 75 MG 24 hr capsule TAKE 1 CAPSULE BY MOUTH DAILY WITH BREAKFAST. 90 capsule 1   simethicone (MYLICON) 40 PR/9.1MB drops Take 1.2 mLs (80 mg total) by mouth 4 (four) times daily as needed for flatulence. 30 mL 0   No facility-administered medications prior to visit.    Allergies  Allergen Reactions   Hydrocodone-Acetaminophen Other (See Comments)    "Changed personality" "made me very mean"   Percocet [Oxycodone-Acetaminophen] Other (See Comments)    hallucination   Sulfa Antibiotics Other (See Comments)    hallucinations    Review of Systems  Constitutional:  Negative.   Respiratory: Negative.    Cardiovascular: Negative.   Gastrointestinal: Negative.   Genitourinary:  Positive for dysuria, frequency and pelvic pain. Negative for hematuria.  Skin: Negative.   Allergic/Immunologic: Negative.   Neurological: Negative.   Psychiatric/Behavioral: Negative.    All other systems reviewed and are negative.     Objective:    Physical Exam Vitals and nursing note reviewed.  Constitutional:      Appearance: Normal appearance.  Cardiovascular:      Rate and Rhythm: Normal rate and regular rhythm.     Pulses: Normal pulses.     Heart sounds: Normal heart sounds.  Pulmonary:     Effort: Pulmonary effort is normal.     Breath sounds: Normal breath sounds.  Abdominal:     Palpations: Abdomen is soft.  Musculoskeletal:     Cervical back: Normal range of motion and neck supple.  Skin:    General: Skin is warm and dry.  Neurological:     General: No focal deficit present.  Psychiatric:        Mood and Affect: Mood normal.        Behavior: Behavior normal.   BP (!) 142/84   Pulse 68   Temp (!) 95.7 F (35.4 C)   Ht 5' 2.99" (1.6 m)   Wt 149 lb 12.8 oz (67.9 kg)   SpO2 96%   BMI 26.54 kg/m  Wt Readings from Last 3 Encounters:  09/20/21 149 lb 12.8 oz (67.9 kg)  08/16/21 149 lb (67.6 kg)  08/04/21 147 lb 6.4 oz (66.9 kg)    Health Maintenance Due  Topic Date Due   HEMOGLOBIN A1C  08/11/2021   COLONOSCOPY (Pts 45-1yrs Insurance coverage will need to be confirmed)  08/31/2021    There are no preventive care reminders to display for this patient.   No results found for: TSH Lab Results  Component Value Date   WBC 6.4 08/11/2021   HGB 10.9 (L) 08/11/2021   HCT 34.2 (L) 08/11/2021   MCV 86.1 08/11/2021   PLT 289.0 08/11/2021   Lab Results  Component Value Date   NA 141 08/11/2021   K 4.0 08/11/2021   CO2 33 (H) 08/11/2021   GLUCOSE 170 (H) 08/11/2021   BUN 24 (H) 08/11/2021   CREATININE 0.99 08/11/2021   BILITOT 0.4 08/11/2021   ALKPHOS 61 08/11/2021   AST 16 08/11/2021   ALT 14 08/11/2021   PROT 6.1 08/11/2021   ALBUMIN 4.0 08/11/2021   CALCIUM 9.9 08/11/2021   ANIONGAP 11 01/22/2020   GFR 54.28 (L) 08/11/2021   Lab Results  Component Value Date   CHOL 128 02/08/2021   Lab Results  Component Value Date   HDL 50.70 02/08/2021   Lab Results  Component Value Date   LDLCALC 59 02/08/2021   Lab Results  Component Value Date   TRIG 93.0 02/08/2021   Lab Results  Component Value Date    CHOLHDL 3 02/08/2021   Lab Results  Component Value Date   HGBA1C 6.5 02/08/2021       Assessment & Plan:   Problem List Items Addressed This Visit   None Visit Diagnoses     Dysuria    -  Primary   Relevant Orders   POC Urinalysis Dipstick   Urinary frequency       Relevant Orders   POC Urinalysis Dipstick        Meds ordered this encounter  Medications   nitrofurantoin, macrocrystal-monohydrate, (MACROBID) 100 MG  capsule    Sig: Take 1 capsule (100 mg total) by mouth 2 (two) times daily.    Dispense:  10 capsule    Refill:  0   Patient will return with a urine sample prior to initiation of the antibiotics since she was not successful obtaining one in the office. Macrobid sent.   Kennyth Arnold, FNP

## 2021-10-03 DIAGNOSIS — I480 Paroxysmal atrial fibrillation: Secondary | ICD-10-CM | POA: Diagnosis not present

## 2021-10-03 DIAGNOSIS — R002 Palpitations: Secondary | ICD-10-CM | POA: Diagnosis not present

## 2021-10-04 ENCOUNTER — Telehealth: Payer: Self-pay | Admitting: Gastroenterology

## 2021-10-04 NOTE — Telephone Encounter (Signed)
Called and spoke with patient. She reports that she has alternating diarrhea and constipation. Pt reports that she will have constipation for up to 5 days and then "quick on diarrhea". Pt reports that her last episode of diarrhea was last week, she has been passing small "pellets", and had a small volume bowel movement today. Pt does report abdominal bloating. Pt states that she will have lower abdominal cramping when she is going to have an episode of diarrhea, she reports that the cramping is relived after BM. Pt states that she is still on a low FODMAP diet. Pt reports that she has been taking 1 Citrucel tablet daily, 1 Align daily, and 2 tablespoons of Miralax mixed in her coffee daily. Pt reports that she has been on this regimen for about a month, but can't get her bowels to be consistent. Please advise, thanks.

## 2021-10-04 NOTE — Telephone Encounter (Signed)
Called and spoke with patient's husband in regards to recommendations as outlined below. Pt's husband states that he will relay this information to the patient. He will have her call back if she has any questions or concerns. Pt's husband verbalized understanding of all information and had no concerns at the end of the call.

## 2021-10-04 NOTE — Telephone Encounter (Signed)
Inbound call from patient states she is experiencing constipation. She have tried miralax and citrucel which is not working for her. Have been taking it for over a month

## 2021-10-04 NOTE — Telephone Encounter (Signed)
Sounds like she may be having constipation with overflow diarrhea which has been my concern in the past but she thought things were worse with Miralax. We added Citrucel, sorry to hear it has not helped. She may want to do a bowel purge with Miralax (essentially a Miralax bowel prep) to clear her out completely and see if this helps her for some time. If she is not going for upwards of 5 days at a time she should increase Miralax to double dose BID and see if she can have a reliable BM once daily after the purge, if she is willing. Thanks

## 2021-10-20 ENCOUNTER — Other Ambulatory Visit: Payer: Self-pay | Admitting: Family Medicine

## 2021-10-20 ENCOUNTER — Encounter: Payer: Self-pay | Admitting: *Deleted

## 2021-10-20 MED ORDER — ROPINIROLE HCL 1 MG PO TABS: 1.0000 mg | ORAL_TABLET | Freq: Every day | ORAL | 11 refills | Status: DC

## 2021-10-20 NOTE — Telephone Encounter (Signed)
Pt called requesting refill of Rx rOPINIRole (REQUIP) 0.25 MG tablet stated she is taking 4 pills a night . Please Advise [phone]

## 2021-10-20 NOTE — Telephone Encounter (Signed)
What Pharmacy?

## 2021-10-20 NOTE — Telephone Encounter (Signed)
See Requip refill request.

## 2021-10-20 NOTE — Telephone Encounter (Signed)
Current medication list states one tablet at bedtime.  Okay to send in for 4 tablets qhs?

## 2021-10-20 NOTE — Telephone Encounter (Signed)
Left message for Mrs. Holtslad that Dr. Diona Browner has sent in higher dose of ropinirole... so instead of 4 x 0.25 mg ropinirole.. she can take 1 tab of 1 mg daily.

## 2021-10-20 NOTE — Telephone Encounter (Signed)
Last office visit 08/16/2021 for DM.  Last refilled 07/01/2021 for #180 with 1 refill.  No future appointments with PCP.  Please schedule 40 minute CPE with fasting labs prior for [facility] for sometime after 02/09/2022.  She already has Medicare Wellness visit with nurse.

## 2021-10-20 NOTE — Telephone Encounter (Signed)
Pt has already been scheduled by cindy

## 2021-10-20 NOTE — Telephone Encounter (Signed)
Let Morgan [name] know I have sent in higher dose of ropinirole... so instead of 4 x 0.25 mg ropinirole.. she can take 1 tab of 1 mg daily.

## 2021-10-21 NOTE — Telephone Encounter (Signed)
Spoke with Mrs. Willmann to let her know of change in dosing.  Patient did get my message and took 1 tablet of the Ropinirole 1 mg last night.

## 2021-11-29 ENCOUNTER — Other Ambulatory Visit: Payer: Self-pay | Admitting: Family Medicine

## 2021-11-29 ENCOUNTER — Encounter: Payer: Self-pay | Admitting: Family Medicine

## 2021-11-29 MED ORDER — ROPINIROLE HCL 1 MG PO TABS: ORAL_TABLET | ORAL | 11 refills | Status: DC

## 2022-01-03 ENCOUNTER — Ambulatory Visit: Payer: Medicare PPO | Admitting: Podiatry

## 2022-01-06 ENCOUNTER — Encounter: Payer: Self-pay | Admitting: Podiatry

## 2022-01-06 ENCOUNTER — Other Ambulatory Visit: Payer: Self-pay

## 2022-01-06 ENCOUNTER — Ambulatory Visit: Payer: Medicare PPO | Admitting: Podiatry

## 2022-01-06 DIAGNOSIS — L600 Ingrowing nail: Secondary | ICD-10-CM | POA: Diagnosis not present

## 2022-01-06 MED ORDER — GENTAMICIN SULFATE 0.1 % EX CREA: 1.0000 "application " | TOPICAL_CREAM | Freq: Two times a day (BID) | CUTANEOUS | 1 refills | Status: DC

## 2022-01-06 MED ORDER — DOXYCYCLINE HYCLATE 100 MG PO TABS: 100.0000 mg | ORAL_TABLET | Freq: Two times a day (BID) | ORAL | 0 refills | Status: DC

## 2022-01-06 NOTE — Patient Instructions (Signed)

## 2022-01-06 NOTE — Progress Notes (Signed)
Subjective: Patient presents today for evaluation of pain to the left great toe. Patient is concerned for possible ingrown nail.  It is very sensitive to touch.  Patient presents today for further treatment and evaluation.  Past Medical History:  Diagnosis Date   Angina    Arthritis    Atrial fibrillation ([place])    ASPIRIN FOR BLOOD THINNER   Atypical mole 09/22/2016   Bulging discs    lumbar    Complication of anesthesia    pt states has choking sensation with ET tube    Coronary artery disease    Depression    Diabetes mellitus    diet controlled/on meds   Family history of breast cancer    Family history of colon cancer    Fibromyalgia    GERD (gastroesophageal reflux disease)    H/O hiatal hernia    Headache(784.0)    "recurring"   High cholesterol    History of colon polyps    Hyperlipidemia    Hypertension    Jackhammer esophagus    Melanoma ([place])    melanoma   Migraines    "til ~ 1980"   PONV (postoperative nausea and vomiting)    Restless leg syndrome    Sciatic nerve pain    "from pinched nerve"   Sleep apnea    uses CPAP   Stroke ([place]) 2014   no deficits   Weakness of right side of body    "I've had PT for it; they don't know what it's from".  CORTISONE INJECTION INTO BACK 08/30/12    Objective:  General: Well developed, nourished, in no acute distress, alert and oriented x3   Dermatology: Skin is warm, dry and supple bilateral. Medial border left great toe appears to be erythematous with evidence of an ingrowing nail. Pain on palpation noted to the border of the nail fold. The remaining nails appear unremarkable at this time. There are no open sores, lesions.  Vascular: Dorsalis Pedis artery and Posterior Tibial artery pedal pulses palpable. No lower extremity edema noted.   Neruologic: Grossly intact via light touch bilateral.  Musculoskeletal: Muscular strength within normal limits in all groups bilateral. Normal range of motion noted to all  pedal and ankle joints.   Assesement: #1 Paronychia with ingrowing nail MED border LT hallux #2 Pain in toe  Plan of Care:  1. Patient evaluated.  2. Discussed treatment alternatives and plan of care. Explained nail avulsion procedure and post procedure course to patient. 3. Patient opted for permanent partial nail avulsion of the ingrown portion of the nail.  4. Prior to procedure, local anesthesia infiltration utilized using 3 ml of a 50:50 mixture of 2% plain lidocaine and 0.5% plain marcaine in a normal hallux block fashion and a betadine prep performed.  5. Partial permanent nail avulsion with chemical matrixectomy performed using [id number] applications of phenol followed by alcohol flush.  6. Light dressing applied.  Post care instructions provided 7.  Prescription for gentamicin 2% cream  8.  Mechanical debridement of the remaining nails was performed using a nail nipper as a courtesy for the patient.  9. Return to clinic 2 weeks.  Edrick Kins, DPM [facility]  Dr. Edrick Kins, DPM    [address]  [place], [place] [address]                Office [phone]  Fax [phone]

## 2022-01-20 ENCOUNTER — Other Ambulatory Visit: Payer: Self-pay

## 2022-01-20 ENCOUNTER — Encounter: Payer: Self-pay | Admitting: Podiatry

## 2022-01-20 ENCOUNTER — Ambulatory Visit: Payer: Medicare PPO | Admitting: Podiatry

## 2022-01-20 DIAGNOSIS — L600 Ingrowing nail: Secondary | ICD-10-CM

## 2022-01-20 NOTE — Progress Notes (Signed)
? ?  Subjective: ?81 y.o. female presents today status post permanent nail avulsion procedure of the medial border left great toe that was performed on 01/06/2022.  Patient states that she is doing better.  She still has some slight drainage from the area.  Overall no new complaints at this time.  ? ?Past Medical History:  ?Diagnosis Date  ? Angina   ? Arthritis   ? Atrial fibrillation ([place])   ? ASPIRIN FOR BLOOD THINNER  ? Atypical mole 09/22/2016  ? Bulging discs   ? lumbar   ? Complication of anesthesia   ? pt states has choking sensation with ET tube   ? Coronary artery disease   ? Depression   ? Diabetes mellitus   ? diet controlled/on meds  ? Family history of breast cancer   ? Family history of colon cancer   ? Fibromyalgia   ? GERD (gastroesophageal reflux disease)   ? H/O hiatal hernia   ? Headache(784.0)   ? "recurring"  ? High cholesterol   ? History of colon polyps   ? Hyperlipidemia   ? Hypertension   ? Jackhammer esophagus   ? Melanoma ([place])   ? melanoma  ? Migraines   ? "til ~ 1980"  ? PONV (postoperative nausea and vomiting)   ? Restless leg syndrome   ? Sciatic nerve pain   ? "from pinched nerve"  ? Sleep apnea   ? uses CPAP  ? Stroke [facility]) 2014  ? no deficits  ? Weakness of right side of body   ? "I've had PT for it; they don't know what it's from".  CORTISONE INJECTION INTO BACK 08/30/12  ? ? ?Objective: ?Skin is warm, dry and supple. Nail and respective nail fold appears to be healing appropriately. Open wound to the associated nail fold with a granular wound base and moderate amount of fibrotic tissue. Minimal drainage noted. Mild erythema around the periungual region likely due to phenol chemical matricectomy. ? ?Assessment: ?#1 s/p partial permanent nail matrixectomy MED border LT hallux ? ? ?Plan of care: ?#1 patient was evaluated  ?#2 light debridement of open wound was performed to the periungual border of the respective toe using a currette. Antibiotic ointment and Band-Aid was applied. ?#3  patient is to return to clinic on a PRN basis. ? ? ?Edrick Kins, DPM ?[place] ? ?Dr. Edrick Kins, DPM  ?  ?[address]                                      ?[place], [place] [address]                ?Office [phone]  ?Fax [phone] ? ? ? ? ?

## 2022-01-23 ENCOUNTER — Telehealth: Payer: Self-pay | Admitting: Family Medicine

## 2022-01-23 DIAGNOSIS — D649 Anemia, unspecified: Secondary | ICD-10-CM

## 2022-01-23 DIAGNOSIS — E1159 Type 2 diabetes mellitus with other circulatory complications: Secondary | ICD-10-CM

## 2022-01-23 NOTE — Telephone Encounter (Signed)
-----   Message from Ellamae Sia sent at 01/19/2022  2:35 PM EST ----- ?Regarding: Lab orders for Monday, 3.20.23 ?Patient is scheduled for CPX labs, please order future labs, Thanks , Terri ? ? ?

## 2022-01-30 ENCOUNTER — Other Ambulatory Visit (INDEPENDENT_AMBULATORY_CARE_PROVIDER_SITE_OTHER): Payer: Medicare PPO

## 2022-01-30 ENCOUNTER — Other Ambulatory Visit: Payer: Self-pay

## 2022-01-30 DIAGNOSIS — E1159 Type 2 diabetes mellitus with other circulatory complications: Secondary | ICD-10-CM

## 2022-01-30 DIAGNOSIS — D649 Anemia, unspecified: Secondary | ICD-10-CM

## 2022-01-30 LAB — HEMOGLOBIN A1C: Hgb A1c MFr Bld: 7.6 % — ABNORMAL HIGH (ref 4.6–6.5)

## 2022-01-30 LAB — CBC WITH DIFFERENTIAL/PLATELET
Basophils Absolute: 0 10*3/uL (ref 0.0–0.1)
Basophils Relative: 0.6 % (ref 0.0–3.0)
Eosinophils Absolute: 0.4 10*3/uL (ref 0.0–0.7)
Eosinophils Relative: 7.7 % — ABNORMAL HIGH (ref 0.0–5.0)
HCT: 38.2 % (ref 36.0–46.0)
Hemoglobin: 12.6 g/dL (ref 12.0–15.0)
Lymphocytes Relative: 26.9 % (ref 12.0–46.0)
Lymphs Abs: 1.4 10*3/uL (ref 0.7–4.0)
MCHC: 33.1 g/dL (ref 30.0–36.0)
MCV: 93.4 fl (ref 78.0–100.0)
Monocytes Absolute: 0.7 10*3/uL (ref 0.1–1.0)
Monocytes Relative: 12.7 % — ABNORMAL HIGH (ref 3.0–12.0)
Neutro Abs: 2.7 10*3/uL (ref 1.4–7.7)
Neutrophils Relative %: 52.1 % (ref 43.0–77.0)
Platelets: 275 10*3/uL (ref 150.0–400.0)
RBC: 4.09 Mil/uL (ref 3.87–5.11)
RDW: 12.9 % (ref 11.5–15.5)
WBC: 5.1 10*3/uL (ref 4.0–10.5)

## 2022-01-30 LAB — IBC + FERRITIN
Ferritin: 19.7 ng/mL (ref 10.0–291.0)
Iron: 100 ug/dL (ref 42–145)
Saturation Ratios: 26.4 % (ref 20.0–50.0)
TIBC: 379.4 ug/dL (ref 250.0–450.0)
Transferrin: 271 mg/dL (ref 212.0–360.0)

## 2022-01-30 LAB — COMPREHENSIVE METABOLIC PANEL
ALT: 18 U/L (ref 0–35)
AST: 19 U/L (ref 0–37)
Albumin: 4.1 g/dL (ref 3.5–5.2)
Alkaline Phosphatase: 68 U/L (ref 39–117)
BUN: 21 mg/dL (ref 6–23)
CO2: 34 mEq/L — ABNORMAL HIGH (ref 19–32)
Calcium: 10.4 mg/dL (ref 8.4–10.5)
Chloride: 100 mEq/L (ref 96–112)
Creatinine, Ser: 0.97 mg/dL (ref 0.40–1.20)
GFR: 55.44 mL/min — ABNORMAL LOW (ref >60.00)
Glucose, Bld: 120 mg/dL — ABNORMAL HIGH (ref 70–99)
Potassium: 4.1 mEq/L (ref 3.5–5.1)
Sodium: 141 mEq/L (ref 135–145)
Total Bilirubin: 0.6 mg/dL (ref 0.2–1.2)
Total Protein: 6 g/dL (ref 6.0–8.3)

## 2022-01-30 LAB — LIPID PANEL
Cholesterol: 113 mg/dL (ref 0–200)
HDL: 55.5 mg/dL (ref >39.00)
LDL Cholesterol: 45 mg/dL (ref 0–99)
NonHDL: 57.68
Total CHOL/HDL Ratio: 2
Triglycerides: 62 mg/dL (ref 0.0–149.0)
VLDL: 12.4 mg/dL (ref 0.0–40.0)

## 2022-01-30 IMAGING — MG MM DIGITAL SCREENING BILAT W/ TOMO AND CAD
8 series · 8 of 24 positions shown · non-contrast
Comparison: Previous exam(s).

CLINICAL DATA: Screening.

EXAM:
DIGITAL SCREENING BILATERAL MAMMOGRAM WITH TOMOSYNTHESIS AND CAD
TECHNIQUE: Bilateral screening digital craniocaudal and mediolateral oblique
mammograms were obtained. Bilateral screening digital breast
tomosynthesis was performed. The images were evaluated with
computer-aided detection.

[R MLO synth-2D]
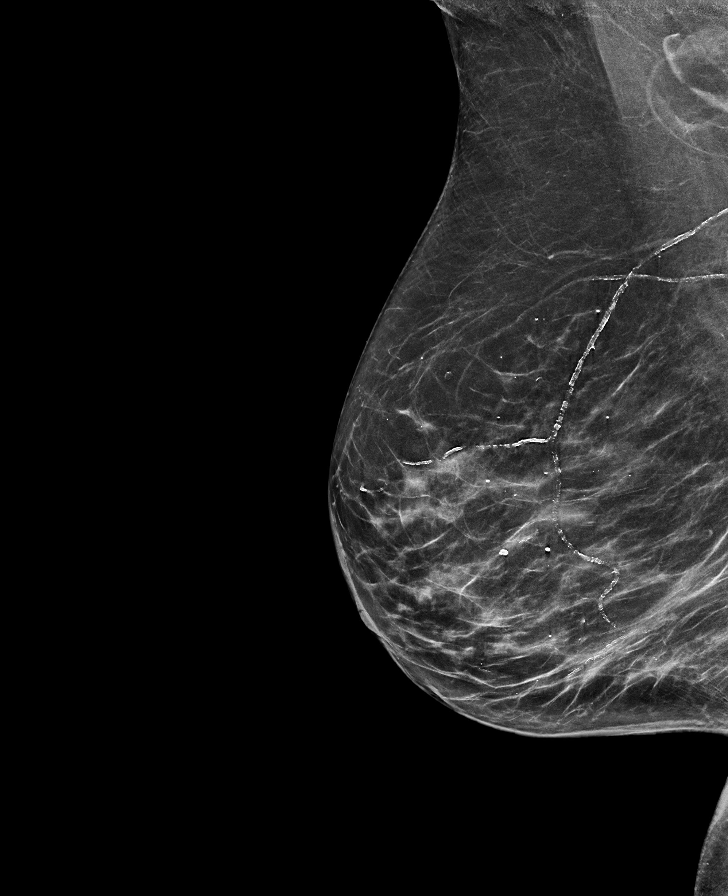

[R CC synth-2D]
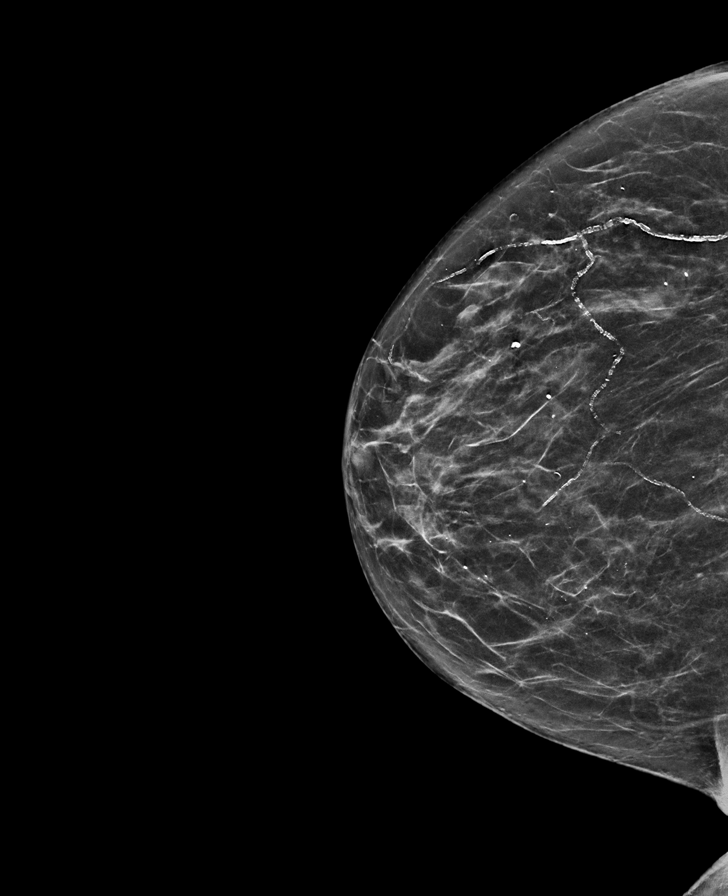

[L MLO synth-2D]
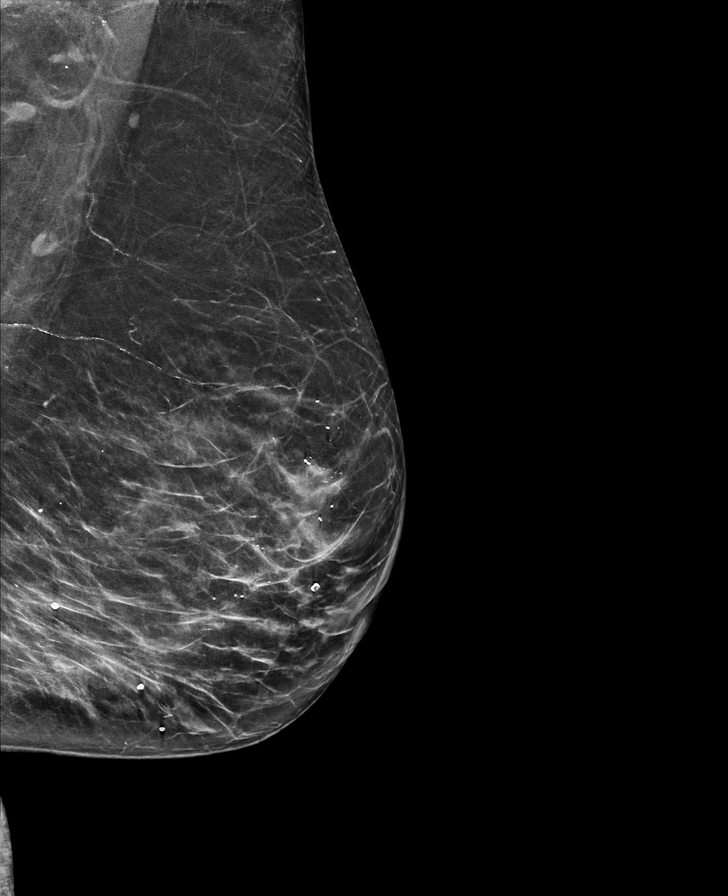

[L CC synth-2D]
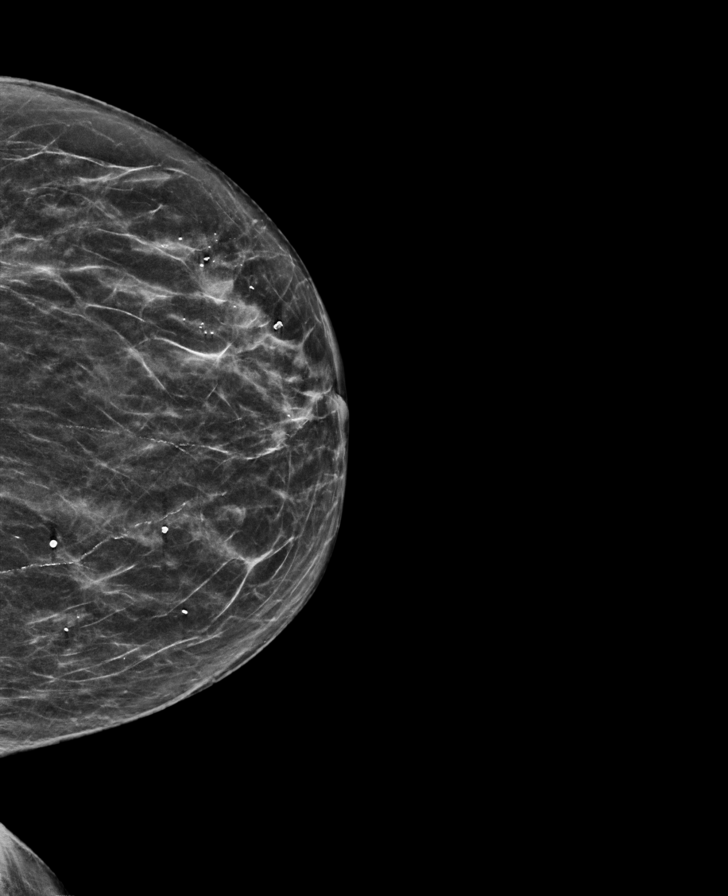

[R CC tomo · tomo slice 31/62.0]
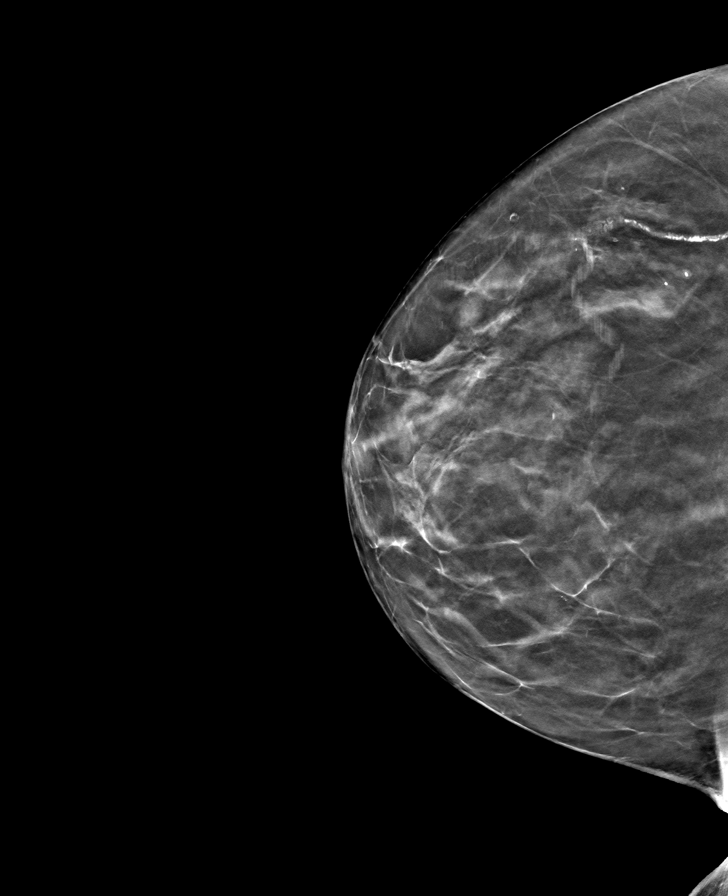

[L CC tomo · tomo slice 33/64.0]
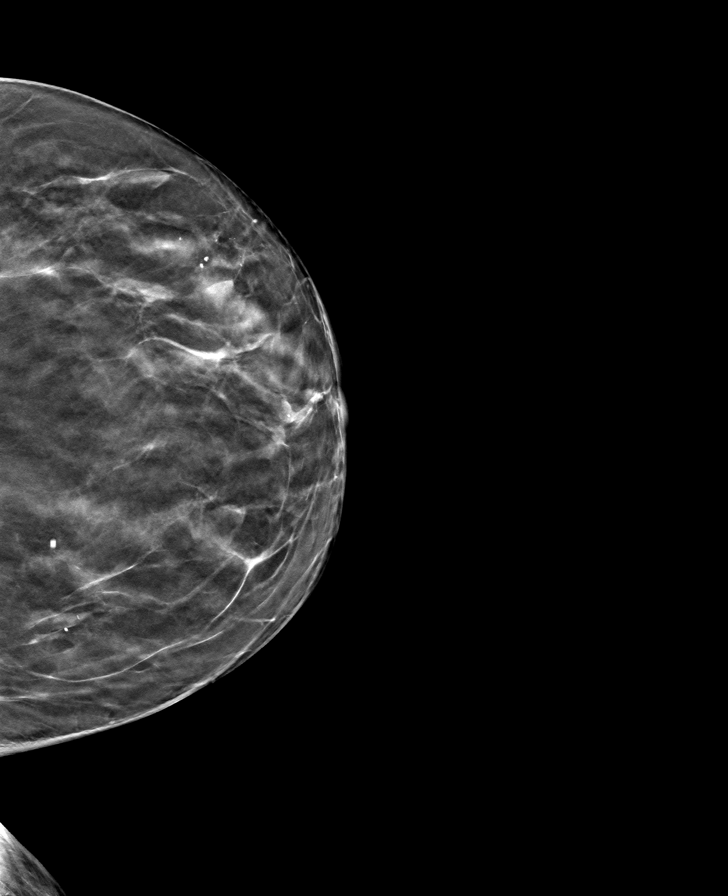

[R MLO tomo · tomo slice 35/70.0]
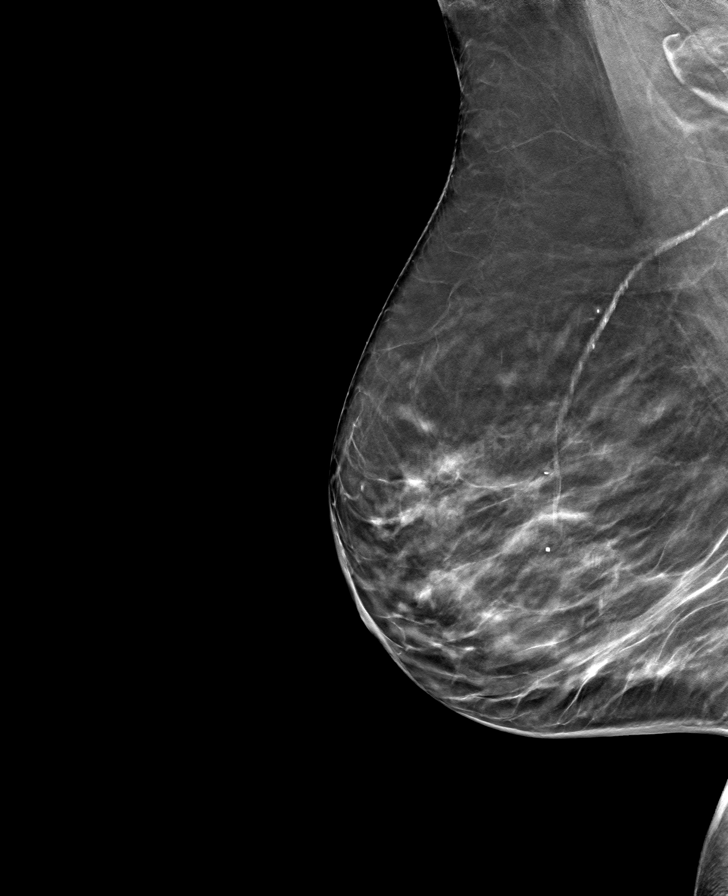

[L MLO tomo · tomo slice 35/68.0]
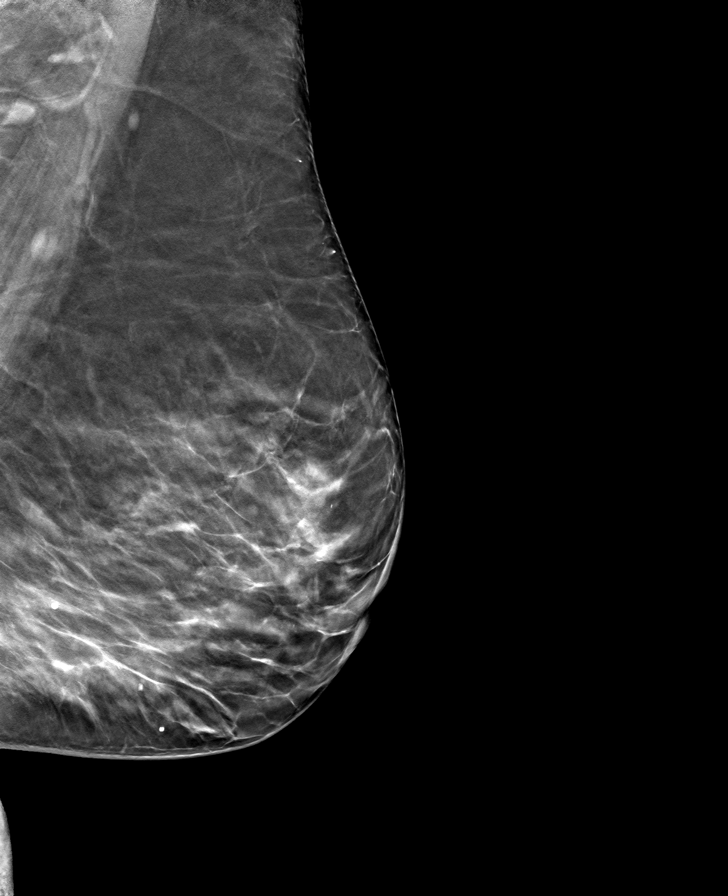

[8 of 24 positions shown; findings below may reference images not displayed]

ACR Breast Density Category b: There are scattered areas of
fibroglandular density.
FINDINGS: There are no findings suspicious for malignancy.
IMPRESSION: No mammographic evidence of malignancy. A result letter of this
screening mammogram will be mailed directly to the patient.

RECOMMENDATION:
Screening mammogram in one year. (Code:51-O-LD2)

BI-RADS CATEGORY  1: Negative.

## 2022-01-31 NOTE — Progress Notes (Signed)
No critical labs need to be addressed urgently. We will discuss labs in detail at upcoming office visit.   

## 2022-02-03 ENCOUNTER — Other Ambulatory Visit: Payer: Self-pay | Admitting: Family Medicine

## 2022-02-06 LAB — HM DIABETES FOOT EXAM

## 2022-02-07 ENCOUNTER — Other Ambulatory Visit: Payer: Self-pay

## 2022-02-07 ENCOUNTER — Ambulatory Visit (INDEPENDENT_AMBULATORY_CARE_PROVIDER_SITE_OTHER): Payer: Medicare PPO | Admitting: Family Medicine

## 2022-02-07 ENCOUNTER — Encounter: Payer: Self-pay | Admitting: Family Medicine

## 2022-02-07 VITALS — BP 118/60 | HR 56 | Temp 97.8°F | Ht 62.0 in | Wt 138.5 lb

## 2022-02-07 DIAGNOSIS — F5101 Primary insomnia: Secondary | ICD-10-CM | POA: Diagnosis not present

## 2022-02-07 DIAGNOSIS — D508 Other iron deficiency anemias: Secondary | ICD-10-CM | POA: Diagnosis not present

## 2022-02-07 DIAGNOSIS — Z0000 Encounter for general adult medical examination without abnormal findings: Secondary | ICD-10-CM

## 2022-02-07 DIAGNOSIS — F308 Other manic episodes: Secondary | ICD-10-CM

## 2022-02-07 DIAGNOSIS — E1159 Type 2 diabetes mellitus with other circulatory complications: Secondary | ICD-10-CM | POA: Diagnosis not present

## 2022-02-07 DIAGNOSIS — I152 Hypertension secondary to endocrine disorders: Secondary | ICD-10-CM

## 2022-02-07 DIAGNOSIS — C434 Malignant melanoma of scalp and neck: Secondary | ICD-10-CM

## 2022-02-07 DIAGNOSIS — F331 Major depressive disorder, recurrent, moderate: Secondary | ICD-10-CM

## 2022-02-07 DIAGNOSIS — N3941 Urge incontinence: Secondary | ICD-10-CM

## 2022-02-07 DIAGNOSIS — E7800 Pure hypercholesterolemia, unspecified: Secondary | ICD-10-CM

## 2022-02-07 DIAGNOSIS — R634 Abnormal weight loss: Secondary | ICD-10-CM

## 2022-02-07 LAB — T4, FREE: Free T4: 0.91 ng/dL (ref 0.60–1.60)

## 2022-02-07 LAB — MICROALBUMIN / CREATININE URINE RATIO
Creatinine,U: 90.4 mg/dL
Microalb Creat Ratio: 3.3 mg/g (ref 0.0–30.0)
Microalb, Ur: 3 mg/dL — ABNORMAL HIGH (ref 0.0–1.9)

## 2022-02-07 LAB — TSH: TSH: 1.96 u[IU]/mL (ref 0.35–5.50)

## 2022-02-07 LAB — T3, FREE: T3, Free: 3.4 pg/mL (ref 2.3–4.2)

## 2022-02-07 MED ORDER — QUETIAPINE FUMARATE 25 MG PO TABS: 25.0000 mg | ORAL_TABLET | Freq: Every day | ORAL | 3 refills | Status: DC

## 2022-02-07 NOTE — Progress Notes (Signed)
25 mg daily ? ? Patient ID: Morgan [name], female    DOB: 07/23/1941, 81 y.o.   MRN: [id number] ? ?This visit was conducted in person. ? ?BP 118/60   Pulse (!) 56   Temp 97.8 ?F (36.6 ?C) (Oral)   Ht '5\' 2"'$  (1.575 m)   Wt 138 lb 8 oz (62.8 kg)   SpO2 99%   BMI 25.33 kg/m?   ? ?CC:  ?Chief Complaint  ?Patient presents with  ? Medicare Wellness  ? ? ?Subjective:  ? ?HPI: ?Morgan [name] is a 81 y.o. female presenting on 02/07/2022 for Medicare Wellness ? ?The patient presents for annual medicare wellness, complete physical and review of chronic health problems. He/She also has the following acute concerns today: ?She feels like she is having more issues with mood. ? She is having stress around husbands Dx of prostate cancer.. " spot on rib" ?There are stressors about living situation. ? B12 has helped with energy. ? ?I have personally reviewed the Medicare Annual Wellness questionnaire and have noted ?1. The patient's medical and social history ?2. Their use of alcohol, tobacco or illicit drugs ?3. Their current medications and supplements ?4. The patient's functional ability including ADL's, fall risks, home safety risks and hearing or visual ?            impairment. ?5. Diet and physical activities ?6. Evidence for depression or mood disorders ?7.         Updated provider list ?Cognitive evaluation was performed and recorded on pt medicare questionnaire form. ?The patients weight, height, BMI and visual acuity have been recorded in the chart   ?I have made referrals, counseling and provided education to the patient based review of the above and I have provided the pt with a written personalized care plan for preventive services.  ? Documentation of this information was scanned into the electronic record under the media tab. ? ? Advance directives and end of life planning reviewed in detail with patient and documented in EMR. Patient given handout on advance care directives if needed. HCPOA and living will  updated if needed. ? ?Hearing Screening  ?Method: Audiometry  ? '500Hz'$  '1000Hz'$  '2000Hz'$  '4000Hz'$   ?Right ear '20 20 20 20  '$ ?Left ear '20 20 20 20  '$ ?Vision Screening - Comments:: Wears Glasses-Eye Exam with [facility] 3 months ago ? ?No falls in last 12 months. ? ?Personnel officer Visit from 09/20/2021 in [facility]  ?PHQ-2 Total Score 0  ? ?  ? ?Hypertension:    Well controlled on  chlorthalidone, metoprolol. ?BP Readings from Last 3 Encounters:  ?02/07/22 118/60  ?09/20/21 (!) 142/84  ?08/16/21 132/64  ?Using medication without problems or lightheadedness:  none ?Chest pain with exertion: none ?Edema: none ?Short of breath:  occ ?Average home BPs: ?Other issues: ? ?Diabetes:   Inadequate control on metformin  1000 mg BID, now off victoza ( given diarrhea, now much better) this is likely the cause of the elevation of the A1C. ?Lab Results  ?Component Value Date  ? HGBA1C 7.6 (H) 01/30/2022  ?Using medications without difficulties: ?Hypoglycemic episodes: ?Hyperglycemic episodes: ?Feet problems: none ?Blood Sugars averaging: ?eye exam within last year: yes.. will get records ? Due for micro-albumin. ? ?Elevated Cholesterol:  LDL at goal on atorvastatin 80 mg daily ?Lab Results  ?Component Value Date  ? CHOL 113 01/30/2022  ? HDL 55.50 01/30/2022  ? LDLCALC 45 01/30/2022  ? TRIG 62.0 01/30/2022  ? CHOLHDL 2 01/30/2022  ?  Using medications without problems: ?Muscle aches:  ?Diet compliance: healthy ?Exercise: minimal. ?Other complaints: ? ? ?Wt Readings from Last 3 Encounters:  ?02/07/22 138 lb 8 oz (62.8 kg)  ?09/20/21 149 lb 12.8 oz (67.9 kg)  ?08/16/21 149 lb (67.6 kg)  ? ? Iron deficiency anemia resolve on iron. ? ?Malignant melanoma: followed by Derm every 6 months. ? ? ? Urge incontinence:   poor control despite 25 mg daily of mYrbetriq. ? ? MDD, GAD: On venlafaxine 75 mg daily.  She has been able to wean off the clonazepam. ? She states she has excess energy.. feels like energizer bunny.   She  feels nervous anxious or on edge. ?This interferes with sleep at night. ? At the same time she states she is feeling down depressed and hopeless. ? She states she does not want less energy but feel it is not normal. ? She does not feel bad when she is not sleeping 2-3 hours a night. ? When she has stopped the venlafaxine in past she has been very irritable ? ?Restless leg syndrome: Using half tablet Requip during the day and 2 tablets at bedtime. This is well controlled at night... but she still reports cramps at night. ? ? ?Relevant past medical, surgical, family and social history reviewed and updated as indicated. Interim medical history since our last visit reviewed. ?Allergies and medications reviewed and updated. ?Outpatient Medications Prior to Visit  ?Medication Sig Dispense Refill  ? Alum & Mag Hydroxide-Simeth (MAALOX REGULAR STRENGTH PO) Take by mouth daily as needed. Patient takes 1 capful daily of the powder formulation.    ? aspirin EC 81 MG tablet Take 1 tablet (81 mg total) by mouth daily.    ? atorvastatin (LIPITOR) 80 MG tablet TAKE 1 TABLET BY MOUTH EVERY DAY 90 tablet 3  ? BLINK TEARS 0.25 % SOLN Place 1 drop into both eyes daily as needed (dry/irritated eyes.).    ? calcium-vitamin D (OSCAL WITH D) 500-200 MG-UNIT tablet Take 1 tablet by mouth 2 (two) times daily.    ? chlorthalidone (HYGROTON) 25 MG tablet TAKE 1 TABLET BY MOUTH EVERY DAY 90 tablet 3  ? CINNAMON PO Take 1,000 mg by mouth in the morning and at bedtime.    ? diphenhydrAMINE HCl ([employer] ALLERGY PO) Take 1 tablet by mouth daily.    ? ferrous sulfate 325 (65 FE) MG tablet Take 325 mg by mouth daily.    ? gabapentin (NEURONTIN) 300 MG capsule TAKE 2 CAPSULES BY MOUTH AT BEDTIME 180 capsule 1  ? Insulin Pen Needle (NOVOFINE) 32G X 6 MM MISC USE TO INJECT VICTOZA DAILY. DX: E11.9 100 each 3  ? Lancet Device MISC Use to check blood sugar twice daily. 1 each 0  ? Lancets (FREESTYLE) lancets Use to check blood sugar twice daily 100 each 11   ? metFORMIN (GLUCOPHAGE-XR) 500 MG 24 hr tablet TAKE 2 TABLETS BY MOUTH TWICE A DAY 360 tablet 3  ? methylcellulose (CITRUCEL) oral powder Use as directed daily    ? metoprolol succinate (TOPROL-XL) 100 MG 24 hr tablet TAKE 1 TABLET (100 MG TOTAL) BY MOUTH DAILY. TAKE WITH OR IMMEDIATELY FOLLOWING A MEAL. 90 tablet 3  ? Multiple Vitamins-Minerals (HAIR SKIN AND NAILS FORMULA PO) Take 1 tablet by mouth daily.    ? Multiple Vitamins-Minerals (PRESERVISION AREDS PO) Take 1 tablet by mouth 2 (two) times daily. Reported on 12/23/2015    ? MYRBETRIQ 25 MG TB24 tablet TAKE 1 TABLET BY MOUTH EVERY  DAY 90 tablet 3  ? nitroGLYCERIN (NITROSTAT) 0.4 MG SL tablet PLACE 1 TABLET (0.4 MG TOTAL) UNDER THE TONGUE EVERY 5 (FIVE) MINUTES AS NEEDED FOR CHEST PAIN 25 tablet 0  ? omeprazole (PRILOSEC OTC) 20 MG tablet Take 20 mg by mouth daily.    ? ONETOUCH ULTRA test strip USE TO CHECK BLOOD SUGAR TWICE DAILY 150 strip 3  ? Probiotic Product (ALIGN PO) Take by mouth.    ? rOPINIRole (REQUIP) 1 MG tablet 1/2 tablet during the day, 2 tablets at bedtime 90 tablet 11  ? venlafaxine XR (EFFEXOR-XR) 75 MG 24 hr capsule TAKE 1 CAPSULE BY MOUTH DAILY WITH BREAKFAST. 90 capsule 0  ? vitamin B-12 (CYANOCOBALAMIN) 1000 MCG tablet Take 1,000 mcg by mouth daily.    ? CARAFATE 1 GM/10ML suspension TAKE 10 MLS (1 G TOTAL) BY MOUTH EVERY 6 (SIX) HOURS AS NEEDED. 420 mL 3  ? cetirizine (ZYRTEC) 10 MG tablet Take 10 mg by mouth daily.     ? clonazePAM (KLONOPIN) 1 MG tablet Take 1 mg by mouth at bedtime.    ? doxycycline (VIBRA-TABS) 100 MG tablet Take 1 tablet (100 mg total) by mouth 2 (two) times daily. 20 tablet 0  ? gentamicin cream (GARAMYCIN) 0.1 % Apply 1 application topically 2 (two) times daily. 30 g 1  ? Methylcellulose, Laxative, (CITRUCEL PO) Take by mouth daily. Patient reports taking 1 po daily    ? nitrofurantoin, macrocrystal-monohydrate, (MACROBID) 100 MG capsule Take 1 capsule (100 mg total) by mouth 2 (two) times daily. (Patient not  taking: Reported on 01/06/2022) 10 capsule 0  ? simethicone (MYLICON) 40 ZD/6.3OV drops Take 1.2 mLs (80 mg total) by mouth 4 (four) times daily as needed for flatulence. 30 mL 0  ? triamcinolone cream (KENALOG) 0.1 % A

## 2022-02-07 NOTE — Patient Instructions (Addendum)
Please stop at the lab to have labs drawn. ? Decrease carbs in the diet. ? ? Hold chlorthalidone for 1-2 weeks to see if urge incontinence improved... if so remain off as long as BP not > 150/90. ? If no improvement in urinary issue.. call for increase dose of Myrbetriq. ? Start low dose Seroquel for  sleep/mood at bedtime. ?I will move forward with referral to psychiatry. ? ?

## 2022-02-09 ENCOUNTER — Ambulatory Visit: Payer: Medicare PPO

## 2022-02-10 DIAGNOSIS — F308 Other manic episodes: Secondary | ICD-10-CM | POA: Insufficient documentation

## 2022-02-10 DIAGNOSIS — R634 Abnormal weight loss: Secondary | ICD-10-CM | POA: Insufficient documentation

## 2022-02-10 NOTE — Assessment & Plan Note (Signed)
New ?Will refer to psychiatry to determine if this is a correct diagnosis.  For now we will start her on a mood stabilizer ?

## 2022-02-10 NOTE — Assessment & Plan Note (Signed)
Chronic, worsening ?Inadequate control on metformin 1000 mg twice daily.  The worsening A1c is likely due to her stopping Victoza as she could not tolerate it because of diarrhea. ?She will work on low carbohydrate diet and increase exercise as tolerated.  We will recheck A1c in 3 months. ?

## 2022-02-10 NOTE — Assessment & Plan Note (Signed)
Chronic, well controlled ?LDL at goal less than 70 on atorvastatin 80 mg daily ?

## 2022-02-10 NOTE — Assessment & Plan Note (Signed)
Chronic, resolved on iron ?

## 2022-02-10 NOTE — Assessment & Plan Note (Addendum)
Chronic, acute worsening ? ?That she has weaned off benzodiazepines she is not sleeping more than a few hours at night and sometimes goes without sleep for several days.  I am concerned that this could be a sign of bipolar disorder/hypomania.  I will start her on a mood stabilizer that will also help with sleep.  We will start Seroquel 25 mg at bedtime.  I will refer her to psychiatry for further evaluation to consider bipolar disorder. ? ?

## 2022-02-10 NOTE — Assessment & Plan Note (Signed)
Chronic, followed by dermatology every 6 months ?

## 2022-02-10 NOTE — Assessment & Plan Note (Signed)
Acute unexpected ?Will evaluate thyroid function determine if this is a cause.  I wonder if her hypomania could be contributing. ?

## 2022-02-10 NOTE — Assessment & Plan Note (Addendum)
Chronic, well controlled on chlorthalidone 5 mg daily and metoprolol XL 100 mg daily. ? ?We will hold chlorthalidone temporarily to make sure its not attributing to her volume leakage incontinence.  Her blood pressure is low enough that it should tolerate coming off this medicine but she will follow blood pressure closely. ?

## 2022-02-10 NOTE — Assessment & Plan Note (Signed)
Acute worsening of chronic issue.  Myrbetriq 25 mg daily has not been helpful. ?Hold chlorthalidone for 1-2 weeks to see if urge incontinence improved... if so remain off as long as BP not > 150/90. ? If no improvement in urinary issue.. call for increase dose of Myrbetriq. ?

## 2022-02-10 NOTE — Assessment & Plan Note (Addendum)
She has previously been diagnosed with major depressive disorder but in conversation today since she has weaned off the clonazepam she says she has excess energy. ?She has completed a bipolar disorder questionnaire and is fairly diffusely positive.  She does not have a problem with the symptoms but knows that it does not feel normal. ?She will continue her SNRI but we will add a mood stabilizer. ? ?

## 2022-02-14 DIAGNOSIS — M65351 Trigger finger, right little finger: Secondary | ICD-10-CM | POA: Diagnosis not present

## 2022-02-14 DIAGNOSIS — M65331 Trigger finger, right middle finger: Secondary | ICD-10-CM | POA: Diagnosis not present

## 2022-02-14 DIAGNOSIS — M65341 Trigger finger, right ring finger: Secondary | ICD-10-CM | POA: Diagnosis not present

## 2022-02-16 ENCOUNTER — Encounter: Payer: Self-pay | Admitting: *Deleted

## 2022-02-20 ENCOUNTER — Telehealth: Payer: Medicare PPO | Admitting: Cardiology

## 2022-02-24 ENCOUNTER — Other Ambulatory Visit: Payer: Self-pay | Admitting: Family Medicine

## 2022-02-24 NOTE — Telephone Encounter (Signed)
I think Mrs. Herrada is requesting a referral for Mr. Manrique to psychiatry as well or at least that is how I am reading the message.   ?

## 2022-02-24 NOTE — Telephone Encounter (Signed)
Please make sure pt has seen Morgan [name]'s MyChart message.. she can call to make psychiatry referral at below number. ?This was referral to psychiatry.. they often have psychologist who can help with the counseling portion associated.. she should ask abut this as well. ? ?

## 2022-02-28 DIAGNOSIS — G2581 Restless legs syndrome: Secondary | ICD-10-CM | POA: Diagnosis not present

## 2022-02-28 DIAGNOSIS — E1142 Type 2 diabetes mellitus with diabetic polyneuropathy: Secondary | ICD-10-CM | POA: Diagnosis not present

## 2022-02-28 DIAGNOSIS — E785 Hyperlipidemia, unspecified: Secondary | ICD-10-CM | POA: Diagnosis not present

## 2022-02-28 DIAGNOSIS — H5462 Unqualified visual loss, left eye, normal vision right eye: Secondary | ICD-10-CM | POA: Diagnosis not present

## 2022-02-28 DIAGNOSIS — I2510 Atherosclerotic heart disease of native coronary artery without angina pectoris: Secondary | ICD-10-CM | POA: Diagnosis not present

## 2022-02-28 DIAGNOSIS — F349 Persistent mood [affective] disorder, unspecified: Secondary | ICD-10-CM | POA: Diagnosis not present

## 2022-02-28 DIAGNOSIS — I10 Essential (primary) hypertension: Secondary | ICD-10-CM | POA: Diagnosis not present

## 2022-02-28 DIAGNOSIS — K219 Gastro-esophageal reflux disease without esophagitis: Secondary | ICD-10-CM | POA: Diagnosis not present

## 2022-02-28 DIAGNOSIS — I4891 Unspecified atrial fibrillation: Secondary | ICD-10-CM | POA: Diagnosis not present

## 2022-03-04 ENCOUNTER — Other Ambulatory Visit: Payer: Self-pay | Admitting: Family Medicine

## 2022-03-09 ENCOUNTER — Encounter: Payer: Self-pay | Admitting: Family Medicine

## 2022-03-10 ENCOUNTER — Ambulatory Visit: Payer: Medicare PPO | Admitting: Family Medicine

## 2022-03-10 ENCOUNTER — Ambulatory Visit
Admission: RE | Admit: 2022-03-10 | Discharge: 2022-03-10 | Disposition: A | Discharge status: Home / Self Care | Payer: Medicare PPO | Source: Ambulatory Visit | Attending: Family Medicine | Admitting: Family Medicine

## 2022-03-10 ENCOUNTER — Other Ambulatory Visit: Payer: Self-pay

## 2022-03-10 VITALS — BP 128/76 | HR 62 | Temp 98.3°F | Ht 62.0 in | Wt 137.3 lb

## 2022-03-10 DIAGNOSIS — G4700 Insomnia, unspecified: Secondary | ICD-10-CM

## 2022-03-10 DIAGNOSIS — S0003XA Contusion of scalp, initial encounter: Secondary | ICD-10-CM | POA: Insufficient documentation

## 2022-03-10 DIAGNOSIS — W19XXXA Unspecified fall, initial encounter: Secondary | ICD-10-CM | POA: Diagnosis not present

## 2022-03-10 DIAGNOSIS — S0990XA Unspecified injury of head, initial encounter: Secondary | ICD-10-CM | POA: Insufficient documentation

## 2022-03-10 DIAGNOSIS — S060X0A Concussion without loss of consciousness, initial encounter: Secondary | ICD-10-CM | POA: Diagnosis not present

## 2022-03-10 DIAGNOSIS — R519 Headache, unspecified: Secondary | ICD-10-CM | POA: Diagnosis not present

## 2022-03-10 DIAGNOSIS — R42 Dizziness and giddiness: Secondary | ICD-10-CM | POA: Diagnosis not present

## 2022-03-10 NOTE — Assessment & Plan Note (Signed)
She is unsure if she has started seroquel and will check.  Continue to have sleep issues ? ?

## 2022-03-10 NOTE — Assessment & Plan Note (Addendum)
Acute ? ?She is high risk for subdural hematoma given she is on aspirin and age 81.  She also has a history of  Hematoma. ? ?We will send her for a stat head CT to rule out intracranial bleeding. ?

## 2022-03-10 NOTE — Patient Instructions (Addendum)
Check to see if you started the Seroquel for sleep. ? I will set up Stat CT of head to evaluate for subdural hematoma and to evaluate scalp swelling/ possible foreign body. ? Start brain rest ( no mental and no physical activity).[name] advance as tolerated. ? Hold baby aspirin until CT scan reviewed. ? Apply warm compress and antibiotic ointment to  scalp lesion. ? Can use tylenol for pain as needed. ?  ?

## 2022-03-10 NOTE — Progress Notes (Signed)
? ? Patient ID: Morgan [name], female    DOB: 07-08-1941, 81 y.o.   MRN: [id number] ? ?This visit was conducted in person. ? ?BP 128/76 (BP Location: Left Arm, Patient Position: Sitting, Cuff Size: Normal)   Pulse 62   Temp 98.3 ?F (36.8 ?C) (Oral)   Ht '5\' 2"'$  (1.575 m)   Wt 137 lb 5 oz (62.3 kg)   SpO2 97%   BMI 25.11 kg/m?   ? ?CC:  ?Chief Complaint  ?Patient presents with  ? Fall  ? ? ?Subjective:  ? ?HPI: ?Morgan [name] is a 81 y.o. female presenting on 03/10/2022 for Fall ? ? She reports she fell  when taking down a picture from the mantle, lost balance. ? Fell backward into  flat slate hearth, broke platter ?Hit posterior skull. ? Had several areas swelling and small cuts likely from platter pieces. ? Some bleeding, no large lacerations greater than 1/2 inch cut. ? Ad some immediate headache, no confusion but felt slower. ? Iced the area, washed area. ? Was able to play cards. ? ? No dizziness, no heart racing. ? ?On asa 81 mg daily ? ? Since then she has had sore scalp.. hurts to touch. ? Also frontal headaches,  not worsening but not getting better. No vision changes, no new numbness or weakness. No nausea. ? Swelling at cuts has decreased. ? She is not sure if she has started seroquel or not. ? ?Oc using aleve for pain. ? 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 ? ?   ? ?Relevant past medical, surgical, family and social history reviewed and updated as indicated. Interim medical history since our last visit reviewed. ?Allergies and medications reviewed and updated. ?Outpatient Medications Prior to Visit  ?Medication Sig Dispense Refill  ? Alum & Mag Hydroxide-Simeth (MAALOX REGULAR STRENGTH PO) Take by mouth daily as needed. Patient takes 1 capful daily of the powder formulation.    ? aspirin EC 81 MG tablet Take 1 tablet (81 mg total) by mouth daily.    ? atorvastatin (LIPITOR) 80 MG tablet TAKE 1 TABLET BY MOUTH EVERY DAY 90 tablet 3  ? BLINK TEARS 0.25 % SOLN Place 1 drop into both  eyes daily as needed (dry/irritated eyes.).    ? calcium-vitamin D (OSCAL WITH D) 500-200 MG-UNIT tablet Take 1 tablet by mouth 2 (two) times daily.    ? chlorthalidone (HYGROTON) 25 MG tablet TAKE 1 TABLET BY MOUTH EVERY DAY 90 tablet 3  ? CINNAMON PO Take 1,000 mg by mouth in the morning and at bedtime.    ? diphenhydrAMINE HCl ([employer] ALLERGY PO) Take 1 tablet by mouth daily.    ? ferrous sulfate 325 (65 FE) MG tablet Take 325 mg by mouth daily.    ? gabapentin (NEURONTIN) 300 MG capsule TAKE 2 CAPSULES BY MOUTH AT BEDTIME 180 capsule 1  ? Insulin Pen Needle (NOVOFINE) 32G X 6 MM MISC USE TO INJECT VICTOZA DAILY. DX: E11.9 100 each 3  ? Lancet Device MISC Use to check blood sugar twice daily. 1 each 0  ? Lancets (FREESTYLE) lancets Use to check blood sugar twice daily 100 each 11  ? metFORMIN (GLUCOPHAGE-XR) 500 MG 24 hr tablet TAKE 2 TABLETS BY MOUTH TWICE A DAY 360 tablet 3  ? methylcellulose (CITRUCEL) oral powder Use as directed daily    ? metoprolol succinate (TOPROL-XL) 100 MG 24 hr tablet TAKE 1 TABLET BY MOUTH DAILY. TAKE WITH OR IMMEDIATELY FOLLOWING A MEAL. 90 tablet  3  ? Multiple Vitamins-Minerals (HAIR SKIN AND NAILS FORMULA PO) Take 1 tablet by mouth daily.    ? Multiple Vitamins-Minerals (PRESERVISION AREDS PO) Take 1 tablet by mouth 2 (two) times daily. Reported on 12/23/2015    ? MYRBETRIQ 25 MG TB24 tablet TAKE 1 TABLET BY MOUTH EVERY DAY 90 tablet 3  ? nitroGLYCERIN (NITROSTAT) 0.4 MG SL tablet PLACE 1 TABLET (0.4 MG TOTAL) UNDER THE TONGUE EVERY 5 (FIVE) MINUTES AS NEEDED FOR CHEST PAIN 25 tablet 0  ? omeprazole (PRILOSEC OTC) 20 MG tablet Take 20 mg by mouth daily.    ? ONETOUCH ULTRA test strip USE TO CHECK BLOOD SUGAR TWICE DAILY 150 strip 3  ? Probiotic Product (ALIGN PO) Take by mouth.    ? QUEtiapine (SEROQUEL) 25 MG tablet TAKE 1 TABLET BY MOUTH EVERYDAY AT BEDTIME 90 tablet 0  ? rOPINIRole (REQUIP) 1 MG tablet 1/2 tablet during the day, 2 tablets at bedtime 90 tablet 11  ? venlafaxine XR  (EFFEXOR-XR) 75 MG 24 hr capsule TAKE 1 CAPSULE BY MOUTH DAILY WITH BREAKFAST. 90 capsule 0  ? vitamin B-12 (CYANOCOBALAMIN) 1000 MCG tablet Take 1,000 mcg by mouth daily.    ? ?No facility-administered medications prior to visit.  ?  ? ?Per HPI unless specifically indicated in ROS section below ?Review of Systems  ?Constitutional:  Negative for fatigue and fever.  ?HENT:  Negative for congestion.   ?Eyes:  Negative for pain.  ?Respiratory:  Negative for cough and shortness of breath.   ?Cardiovascular:  Negative for chest pain, palpitations and leg swelling.  ?Gastrointestinal:  Negative for abdominal pain.  ?Genitourinary:  Negative for dysuria and vaginal bleeding.  ?Musculoskeletal:  Negative for back pain.  ?Skin:  Positive for wound.  ?Neurological:  Positive for headaches. Negative for syncope and light-headedness.  ?Psychiatric/Behavioral:  Negative for dysphoric mood.   ?Objective:  ?BP 128/76 (BP Location: Left Arm, Patient Position: Sitting, Cuff Size: Normal)   Pulse 62   Temp 98.3 ?F (36.8 ?C) (Oral)   Ht '5\' 2"'$  (1.575 m)   Wt 137 lb 5 oz (62.3 kg)   SpO2 97%   BMI 25.11 kg/m?   ?Wt Readings from Last 3 Encounters:  ?03/10/22 137 lb 5 oz (62.3 kg)  ?02/07/22 138 lb 8 oz (62.8 kg)  ?09/20/21 149 lb 12.8 oz (67.9 kg)  ?  ?  ?Physical Exam ?Constitutional:   ?   General: She is not in acute distress. ?   Appearance: Normal appearance. She is well-developed. She is not ill-appearing or toxic-appearing.  ?HENT:  ?   Head: Normocephalic. Abrasion and contusion present. No right periorbital erythema.  ?   Comments: 0.5 cm raised lesion on left scalp with scab...  fluctuant, tender.. possible foreign body in scalp ?   Right Ear: Hearing, tympanic membrane, ear canal and external ear normal. Tympanic membrane is not erythematous, retracted or bulging.  ?   Left Ear: Hearing, tympanic membrane, ear canal and external ear normal. Tympanic membrane is not erythematous, retracted or bulging.  ?   Nose: No  mucosal edema or rhinorrhea.  ?   Right Sinus: No maxillary sinus tenderness or frontal sinus tenderness.  ?   Left Sinus: No maxillary sinus tenderness or frontal sinus tenderness.  ?   Mouth/Throat:  ?   Pharynx: Uvula midline.  ?Eyes:  ?   General: Lids are normal. Lids are everted, no foreign bodies appreciated.  ?   Conjunctiva/sclera: Conjunctivae normal.  ?  Pupils: Pupils are equal, round, and reactive to light.  ?Neck:  ?   Thyroid: No thyroid mass or thyromegaly.  ?   Vascular: No carotid bruit.  ?   Trachea: Trachea normal.  ?Cardiovascular:  ?   Rate and Rhythm: Normal rate and regular rhythm.  ?   Pulses: Normal pulses.  ?   Heart sounds: Normal heart sounds, S1 normal and S2 normal. No murmur heard. ?  No friction rub. No gallop.  ?Pulmonary:  ?   Effort: Pulmonary effort is normal. No tachypnea or respiratory distress.  ?   Breath sounds: Normal breath sounds. No decreased breath sounds, wheezing, rhonchi or rales.  ?Abdominal:  ?   General: Bowel sounds are normal.  ?   Palpations: Abdomen is soft.  ?   Tenderness: There is no abdominal tenderness.  ?Musculoskeletal:  ?   Cervical back: Normal range of motion and neck supple.  ?Skin: ?   General: Skin is warm and dry.  ?   Findings: No rash.  ?Neurological:  ?   Mental Status: She is alert.  ?   Cranial Nerves: Cranial nerves 2-12 are intact.  ?   Sensory: Sensation is intact.  ?   Motor: Motor function is intact.  ?   Coordination: Coordination is intact.  ?Psychiatric:     ?   Mood and Affect: Mood is not anxious or depressed.     ?   Speech: Speech normal.     ?   Behavior: Behavior normal. Behavior is cooperative.     ?   Thought Content: Thought content normal.     ?   Judgment: Judgment normal.  ? ?   ?Results for orders placed or performed in visit on 02/07/22  ?TSH  ?Result Value Ref Range  ? TSH 1.96 0.35 - 5.50 uIU/mL  ?T4, free  ?Result Value Ref Range  ? Free T4 0.91 0.60 - 1.60 ng/dL  ?T3, free  ?Result Value Ref Range  ? T3, Free 3.4  2.3 - 4.2 pg/mL  ?Microalbumin / creatinine urine ratio  ?Result Value Ref Range  ? Microalb, Ur 3.0 (H) 0.0 - 1.9 mg/dL  ? Creatinine,U 90.4 mg/dL  ? Microalb Creat Ratio 3.3 0.0 - 30.0 mg/g  ?[facility] DIABETES FOOT EXAM  ?Resul

## 2022-03-10 NOTE — Assessment & Plan Note (Signed)
No clear secondary cause to fall ?

## 2022-03-10 NOTE — Assessment & Plan Note (Signed)
Acute ? ?Is a area of swelling and soreness in her left posterior scalp that is concerning for possible retained foreign body from the ceramic bladder at that broke on her fall.  Other lacerations and abrasions appear to have healed. ? ?We will review the head CT to determine if any evidence of retained foreign body.  In meantime she can use warm compress and topical antibiotic ointment on the area. ?

## 2022-03-10 NOTE — Assessment & Plan Note (Signed)
acute, poorly controlled ? ?Minimum she has ongoing mild to moderate concussion.  We discussed brain rest both mental and physical to be advanced as tolerated.  She can use Tylenol as needed for pain.  She is to call if her symptoms or not improving as expected ?

## 2022-03-14 DIAGNOSIS — M65331 Trigger finger, right middle finger: Secondary | ICD-10-CM | POA: Diagnosis not present

## 2022-03-14 DIAGNOSIS — M65341 Trigger finger, right ring finger: Secondary | ICD-10-CM | POA: Diagnosis not present

## 2022-03-15 NOTE — Telephone Encounter (Signed)
Patient has appointment 03/17/2022 ?In other communication husband has denied need for psychiatric evaluation. ?

## 2022-03-16 ENCOUNTER — Telehealth: Payer: Medicare PPO | Admitting: Cardiology

## 2022-03-17 ENCOUNTER — Ambulatory Visit: Payer: Medicare PPO | Admitting: Family Medicine

## 2022-03-19 ENCOUNTER — Other Ambulatory Visit: Payer: Self-pay | Admitting: Family Medicine

## 2022-03-21 ENCOUNTER — Encounter: Payer: Self-pay | Admitting: Family Medicine

## 2022-03-21 ENCOUNTER — Ambulatory Visit: Payer: Medicare PPO | Admitting: Family Medicine

## 2022-03-21 VITALS — BP 124/58 | HR 63 | Ht 62.0 in | Wt 134.4 lb

## 2022-03-21 DIAGNOSIS — S060X0A Concussion without loss of consciousness, initial encounter: Secondary | ICD-10-CM | POA: Diagnosis not present

## 2022-03-21 DIAGNOSIS — S0990XD Unspecified injury of head, subsequent encounter: Secondary | ICD-10-CM | POA: Diagnosis not present

## 2022-03-21 DIAGNOSIS — S060X0D Concussion without loss of consciousness, subsequent encounter: Secondary | ICD-10-CM | POA: Diagnosis not present

## 2022-03-21 DIAGNOSIS — S0003XA Contusion of scalp, initial encounter: Secondary | ICD-10-CM

## 2022-03-21 DIAGNOSIS — F308 Other manic episodes: Secondary | ICD-10-CM | POA: Diagnosis not present

## 2022-03-21 DIAGNOSIS — G4700 Insomnia, unspecified: Secondary | ICD-10-CM

## 2022-03-21 DIAGNOSIS — W19XXXD Unspecified fall, subsequent encounter: Secondary | ICD-10-CM

## 2022-03-21 NOTE — Assessment & Plan Note (Signed)
Acute, improving ? ?No sign of foreign body on head CT of skull.  Continue to treat areas with antibiotic ointment. ?

## 2022-03-21 NOTE — Assessment & Plan Note (Signed)
Chronic, improved control ? ?Continue Seroquel 25 mg at bedtime. ?

## 2022-03-21 NOTE — Assessment & Plan Note (Signed)
Unclear diagnosis but overall improved control with addition of Seroquel and improved sleep. ?I have encouraged her to continue to try to set up an appointment with psychiatry for further evaluation. ?

## 2022-03-21 NOTE — Assessment & Plan Note (Addendum)
Acute, improving ? ?Decreasing issues with headache as she continues brain rest.  Encouraged her to continue overall brain rest until resolution of headache.  She can then advance activity as tolerated. ? ?CT scan of head reviewed in detail with the patient.  No sign of intracranial bleed. ?

## 2022-03-21 NOTE — Progress Notes (Signed)
? ? Patient ID: Morgan [name], female    DOB: 1941-03-14, 81 y.o.   MRN: [id number] ? ?This visit was conducted in person. ? ?BP (!) 124/58   Pulse 63   Ht '5\' 2"'$  (1.575 m)   Wt 134 lb 6.4 oz (61 kg)   SpO2 97%   BMI 24.58 kg/m?   ? ?CC:  ?Chief Complaint  ?Patient presents with  ? Fall  ?  Having Pt fell about 2 weeks ago and hit her hit and now bump on head , having pain, headaches no dizziness, pt didn't black out when she fell.   ? ? ?Subjective:  ? ?HPI: ?Morgan [name] is a 81 y.o. female presenting on 03/21/2022 for Fall (Having Pt fell about 2 weeks ago and hit her hit and now bump on head , having pain, headaches no dizziness, pt didn't black out when she fell. ) ? ? Reviewed OV post fall 03/10/2022. ?At that point she reported a fall when taking down a picture from the mantle, she lost balance fell backward and broke a platter.  She had hit her posterior skull and had several areas of swelling and small cuts.  Head CT was performed that showed no intracranial bleed as well as only soft tissue edema no sign of foreign bodies and scalp ? ?She was started on brain rest for mild to moderate concussion and follows up today. ?Is been using warm compress and antibiotic ointment to the largest scalp lesion. ? ? ? She has noted decrease in size and scab fallen off.  She has improving pain, only sore to touch. ? Small areas are tender but improving. ?She continues to have headache but they are less frequent. ? Seem to be more when she gets tired. ? No dizziness. ? ?She is sleeping better with insomnia on Seroquel. She is sleeping 6 hours a night now. ?   She feel well rested when awaking up waking morning now. ? ?Relevant past medical, surgical, family and social history reviewed and updated as indicated. Interim medical history since our last visit reviewed. ?Allergies and medications reviewed and updated. ?Outpatient Medications Prior to Visit  ?Medication Sig Dispense Refill  ? Alum & Mag Hydroxide-Simeth  (MAALOX REGULAR STRENGTH PO) Take by mouth daily as needed. Patient takes 1 capful daily of the powder formulation.    ? aspirin EC 81 MG tablet Take 1 tablet (81 mg total) by mouth daily.    ? atorvastatin (LIPITOR) 80 MG tablet TAKE 1 TABLET BY MOUTH EVERY DAY 90 tablet 3  ? BLINK TEARS 0.25 % SOLN Place 1 drop into both eyes daily as needed (dry/irritated eyes.).    ? calcium-vitamin D (OSCAL WITH D) 500-200 MG-UNIT tablet Take 1 tablet by mouth 2 (two) times daily.    ? chlorthalidone (HYGROTON) 25 MG tablet TAKE 1 TABLET BY MOUTH EVERY DAY 90 tablet 3  ? CINNAMON PO Take 1,000 mg by mouth in the morning and at bedtime.    ? diphenhydrAMINE HCl ([employer] ALLERGY PO) Take 1 tablet by mouth daily.    ? ferrous sulfate 325 (65 FE) MG tablet Take 325 mg by mouth daily.    ? gabapentin (NEURONTIN) 300 MG capsule TAKE 2 CAPSULES BY MOUTH AT BEDTIME 180 capsule 1  ? Insulin Pen Needle (NOVOFINE) 32G X 6 MM MISC USE TO INJECT VICTOZA DAILY. DX: E11.9 100 each 3  ? Lancet Device MISC Use to check blood sugar twice daily. 1 each 0  ? Lancets (FREESTYLE)  lancets Use to check blood sugar twice daily 100 each 11  ? metFORMIN (GLUCOPHAGE-XR) 500 MG 24 hr tablet TAKE 2 TABLETS BY MOUTH TWICE A DAY 360 tablet 3  ? methylcellulose (CITRUCEL) oral powder Use as directed daily    ? metoprolol succinate (TOPROL-XL) 100 MG 24 hr tablet TAKE 1 TABLET BY MOUTH DAILY. TAKE WITH OR IMMEDIATELY FOLLOWING A MEAL. 90 tablet 3  ? Multiple Vitamins-Minerals (HAIR SKIN AND NAILS FORMULA PO) Take 1 tablet by mouth daily.    ? Multiple Vitamins-Minerals (PRESERVISION AREDS PO) Take 1 tablet by mouth 2 (two) times daily. Reported on 12/23/2015    ? MYRBETRIQ 25 MG TB24 tablet TAKE 1 TABLET BY MOUTH EVERY DAY 90 tablet 3  ? nitroGLYCERIN (NITROSTAT) 0.4 MG SL tablet PLACE 1 TABLET (0.4 MG TOTAL) UNDER THE TONGUE EVERY 5 (FIVE) MINUTES AS NEEDED FOR CHEST PAIN 25 tablet 0  ? omeprazole (PRILOSEC OTC) 20 MG tablet Take 20 mg by mouth daily.    ? ONETOUCH  ULTRA test strip USE TO CHECK BLOOD SUGAR TWICE DAILY 150 strip 3  ? Probiotic Product (ALIGN PO) Take by mouth.    ? QUEtiapine (SEROQUEL) 25 MG tablet TAKE 1 TABLET BY MOUTH EVERYDAY AT BEDTIME 90 tablet 0  ? rOPINIRole (REQUIP) 1 MG tablet 1/2 tablet during the day, 2 tablets at bedtime 90 tablet 11  ? venlafaxine XR (EFFEXOR-XR) 75 MG 24 hr capsule TAKE 1 CAPSULE BY MOUTH DAILY WITH BREAKFAST. 90 capsule 0  ? vitamin B-12 (CYANOCOBALAMIN) 1000 MCG tablet Take 1,000 mcg by mouth daily.    ? ?No facility-administered medications prior to visit.  ?  ? ?Per HPI unless specifically indicated in ROS section below ?Review of Systems  ?Constitutional:  Negative for fatigue and fever.  ?HENT:  Negative for congestion.   ?Eyes:  Negative for pain.  ?Respiratory:  Negative for cough and shortness of breath.   ?Cardiovascular:  Negative for chest pain, palpitations and leg swelling.  ?Gastrointestinal:  Negative for abdominal pain.  ?Genitourinary:  Negative for dysuria and vaginal bleeding.  ?Musculoskeletal:  Negative for back pain.  ?Neurological:  Negative for syncope, light-headedness and headaches.  ?Psychiatric/Behavioral:  Negative for dysphoric mood.   ?Objective:  ?BP (!) 124/58   Pulse 63   Ht '5\' 2"'$  (1.575 m)   Wt 134 lb 6.4 oz (61 kg)   SpO2 97%   BMI 24.58 kg/m?   ?Wt Readings from Last 3 Encounters:  ?03/21/22 134 lb 6.4 oz (61 kg)  ?03/10/22 137 lb 5 oz (62.3 kg)  ?02/07/22 138 lb 8 oz (62.8 kg)  ?  ?  ?Physical Exam ?Constitutional:   ?   General: She is not in acute distress. ?   Appearance: Normal appearance. She is well-developed. She is not ill-appearing or toxic-appearing.  ?HENT:  ?   Head: Normocephalic.  ?   Right Ear: Hearing, tympanic membrane, ear canal and external ear normal. Tympanic membrane is not erythematous, retracted or bulging.  ?   Left Ear: Hearing, tympanic membrane, ear canal and external ear normal. Tympanic membrane is not erythematous, retracted or bulging.  ?   Nose: No  mucosal edema or rhinorrhea.  ?   Right Sinus: No maxillary sinus tenderness or frontal sinus tenderness.  ?   Left Sinus: No maxillary sinus tenderness or frontal sinus tenderness.  ?   Mouth/Throat:  ?   Pharynx: Uvula midline.  ?Eyes:  ?   General: Lids are normal. Lids are everted, no  foreign bodies appreciated.  ?   Conjunctiva/sclera: Conjunctivae normal.  ?   Pupils: Pupils are equal, round, and reactive to light.  ?Neck:  ?   Thyroid: No thyroid mass or thyromegaly.  ?   Vascular: No carotid bruit.  ?   Trachea: Trachea normal.  ?Cardiovascular:  ?   Rate and Rhythm: Normal rate and regular rhythm.  ?   Pulses: Normal pulses.  ?   Heart sounds: Normal heart sounds, S1 normal and S2 normal. No murmur heard. ?  No friction rub. No gallop.  ?Pulmonary:  ?   Effort: Pulmonary effort is normal. No tachypnea or respiratory distress.  ?   Breath sounds: Normal breath sounds. No decreased breath sounds, wheezing, rhonchi or rales.  ?Abdominal:  ?   General: Bowel sounds are normal.  ?   Palpations: Abdomen is soft.  ?   Tenderness: There is no abdominal tenderness.  ?Musculoskeletal:  ?   Cervical back: Normal range of motion and neck supple.  ?Skin: ?   General: Skin is warm and dry.  ?   Findings: No rash.  ?   Comments: Healing pinpoint lacerations on scalp, no erythema or warmth.  Minimal pain.  ?Neurological:  ?   Mental Status: She is alert.  ?Psychiatric:     ?   Mood and Affect: Mood is not anxious or depressed.     ?   Speech: Speech normal.     ?   Behavior: Behavior normal. Behavior is cooperative.     ?   Thought Content: Thought content normal.     ?   Judgment: Judgment normal.  ? ?   ?Results for orders placed or performed in visit on 02/07/22  ?TSH  ?Result Value Ref Range  ? TSH 1.96 0.35 - 5.50 uIU/mL  ?T4, free  ?Result Value Ref Range  ? Free T4 0.91 0.60 - 1.60 ng/dL  ?T3, free  ?Result Value Ref Range  ? T3, Free 3.4 2.3 - 4.2 pg/mL  ?Microalbumin / creatinine urine ratio  ?Result Value Ref  Range  ? Microalb, Ur 3.0 (H) 0.0 - 1.9 mg/dL  ? Creatinine,U 90.4 mg/dL  ? Microalb Creat Ratio 3.3 0.0 - 30.0 mg/g  ?HM DIABETES FOOT EXAM  ?Result Value Ref Range  ? HM Diabetic Foot Exam done   ? ? ? ?COVID 19 screen:

## 2022-03-21 NOTE — Patient Instructions (Addendum)
Continue Seroquel at night as needed. ? ?

## 2022-03-22 ENCOUNTER — Encounter: Payer: Self-pay | Admitting: Cardiology

## 2022-03-22 ENCOUNTER — Telehealth (INDEPENDENT_AMBULATORY_CARE_PROVIDER_SITE_OTHER): Payer: Medicare PPO | Admitting: Cardiology

## 2022-03-22 VITALS — Ht 63.5 in | Wt 134.0 lb

## 2022-03-22 DIAGNOSIS — I10 Essential (primary) hypertension: Secondary | ICD-10-CM

## 2022-03-22 DIAGNOSIS — G4733 Obstructive sleep apnea (adult) (pediatric): Secondary | ICD-10-CM

## 2022-03-22 NOTE — Progress Notes (Signed)
?  STOP BANG RISK ASSESSMENT ?S (snore) Have you been told that you snore?     YES ?  ?T (tired) Are you often tired, fatigued, or sleepy during the day?  ? NO  ?O (obstruction) Do you stop breathing, choke, or gasp during sleep? NO ?  ?P (pressure) Do you have or are you being treated for high blood pressure? YES ?  ?B (BMI) Is your body index greater than 35 kg/m? NO ?  ?A (age) Are you 80 years old or older? YES ?  ?N (neck) Do you have a neck circumference greater than 16 inches?  ? NO ?  ?G (gender) Are you a female? NO ?  ?TOTAL STOP/BANG ?YES? ANSWERS 3  ? ?                                                                    For Office Use Only              Procedure Order Form    ?YES to 3+ Stop Bang questions OR two clinical symptoms - patient qualifies for WatchPAT (CPT 95800)     ?       ? ?Clinical Notes: Will consult Sleep Specialist and refer for management of therapy due to patient increased risk of Sleep Apnea. Ordering a sleep study due to the following two clinical symptoms: Excessive daytime sleepiness G47.10 / Gastroesophageal reflux K21.9 / Nocturia R35.1 / Morning Headaches G44.221 / Difficulty concentrating R41.840 / Memory problems or poor judgment G31.84 / Personality changes or irritability R45.4 / Loud snoring R06.83 / Depression F32.9 / Unrefreshed by sleep G47.8 / Impotence N52.9 / History of high blood pressure R03.0 / Insomnia G47.00  ? ? ?I understand that I am proceeding with a home sleep apnea test as ordered by my treating physician. I understand that untreated sleep apnea is a serious cardiovascular risk factor and it is my responsibility to perform the test and seek management for sleep apnea. I will be contacted with the results and be managed for sleep apnea by a local sleep physician. I will be receiving equipment and further instructions from [facility]. I shall promptly ship back the equipment via the included mailing label. I understand my insurance will be billed for the  test and as the patient I am responsible for any insurance related out-of-pocket costs incurred. I have been provided with written instructions and can call for additional video or telephonic instruction, with 24-hour availability of qualified personnel to answer any questions: Patient Help Desk [phone]. ? ?Patient Telemedicine Verbal Consent ? ? ? ?

## 2022-03-22 NOTE — Progress Notes (Signed)
? ?Virtual Visit via Video Note  ? ?This visit type was conducted due to national recommendations for restrictions regarding the COVID-19 Pandemic (e.g. social distancing) in an effort to limit this patient's exposure and mitigate transmission in our community.  Due to her co-morbid illnesses, this patient is at least at moderate risk for complications without adequate follow up.  This format is felt to be most appropriate for this patient at this time.  All issues noted in this document were discussed and addressed.  A limited physical exam was performed with this format.  Please refer to the patient's chart for her consent to telehealth for [facility].  ? ?Evaluation Performed:  Follow-up visit ? ?This visit type was conducted due to national recommendations for restrictions regarding the COVID-19 Pandemic (e.g. social distancing).  This format is felt to be most appropriate for this patient at this time.  All issues noted in this document were discussed and addressed.  No physical exam was performed (except for noted visual exam findings with Video Visits).  Please refer to the patient's chart (MyChart message for video visits and phone note for telephone visits) for the patient's consent to telehealth for [facility]. ? ?Date:  03/22/2022  ? ?ID:  Morgan [name], DOB Aug 27, 1941, MRN [id number] ? ?Patient Location:  ?Home ? ?Provider location:   ?[facility] ? ?PCP:  Jinny Sanders, MD  ?Cardiologist:  Candee Furbish, MD  ?Sleep Medicine:  Fransico Him, MD ?Electrophysiologist:  None  ? ?Chief Complaint:  OSA ? ?History of Present Illness:   ? ?Morgan [name] is a 81 y.o. female who presents via Engineer, civil (consulting) for a telehealth visit today.   ? ?Morgan [name] is a 81 y.o. female with a hx of OSA, HTN and obesity. She is doing well with she CPAP device and thinks that she has gotten used to it.  She tolerates the mask and feels the pressure is adequate.  Since going on CPAP she feels rested in the  am and has no significant daytime sleepiness.  She denies any significant mouth or nasal dryness or nasal congestion.  She does not think that he snores.    ? ?The patient does not have symptoms concerning for COVID-19 infection (fever, chills, cough, or new shortness of breath).  ? ?Prior CV studies:   ?The following studies were reviewed today: ? ?PAP compliance download from [place] ? ?Past Medical History:  ?Diagnosis Date  ? Angina   ? Arthritis   ? Atrial fibrillation ([place])   ? ASPIRIN FOR BLOOD THINNER  ? Atypical mole 09/22/2016  ? Bulging discs   ? lumbar   ? Complication of anesthesia   ? pt states has choking sensation with ET tube   ? Coronary artery disease   ? Depression   ? Diabetes mellitus   ? diet controlled/on meds  ? Family history of breast cancer   ? Family history of colon cancer   ? Fibromyalgia   ? GERD (gastroesophageal reflux disease)   ? H/O hiatal hernia   ? Headache(784.0)   ? "recurring"  ? High cholesterol   ? History of colon polyps   ? Hyperlipidemia   ? Hypertension   ? [place] esophagus   ? Melanoma ([place])   ? melanoma  ? Migraines   ? "til ~ 1980"  ? PONV (postoperative nausea and vomiting)   ? Restless leg syndrome   ? Sciatic nerve pain   ? "from pinched nerve"  ? Sleep apnea   ?  uses CPAP  ? Stroke [facility]) 2014  ? no deficits  ? Weakness of right side of body   ? "I've had PT for it; they don't know what it's from".  CORTISONE INJECTION INTO BACK 08/30/12  ? ?Past Surgical History:  ?Procedure Laterality Date  ? 78 HOUR [place] STUDY N/A 12/29/2015  ? Procedure: [place] STUDY;  Surgeon: Manus Gunning, MD;  Location: [facility];  Service: Gastroenterology;  Laterality: N/A;  ? CARPAL TUNNEL RELEASE Left 07/02/2015  ? Procedure: CARPAL TUNNEL RELEASE;  Surgeon: Frederik Pear, MD;  Location: [facility];  Service: Orthopedics;  Laterality: Left;  ? CATARACT EXTRACTION, BILATERAL Bilateral   ? Oct and Nov 2017  ? COLONOSCOPY  06/30/2019  ? CORONARY ANGIOPLASTY  WITH STENT PLACEMENT  06/2011  ? "1"  ? CORONARY ARTERY BYPASS GRAFT  2005  ? CABG X 2  ? DILATION AND CURETTAGE OF UTERUS    ? "more than once"  ? ELBOW ARTHROSCOPY Left 07/02/2015  ? Procedure: ARTHROSCOPY LEFT ELBOW WITH DEBRIDEMENT AND REMOVAL LOOSE BODY;  Surgeon: Frederik Pear, MD;  Location: [facility];  Service: Orthopedics;  Laterality: Left;  ? ESOPHAGEAL DILATION  01/21/2020  ? Procedure: ESOPHAGEAL DILATION;  Surgeon: Lavena Bullion, DO;  Location: [facility];  Service: Gastroenterology;;  ? ESOPHAGEAL MANOMETRY N/A 12/29/2015  ? Procedure: ESOPHAGEAL MANOMETRY (EM);  Surgeon: Manus Gunning, MD;  Location: [facility];  Service: Gastroenterology;  Laterality: N/A;  ? ESOPHAGEAL MANOMETRY N/A 07/30/2019  ? Procedure: ESOPHAGEAL MANOMETRY (EM);  Surgeon: Yetta Flock, MD;  Location: [facility];  Service: Gastroenterology;  Laterality: N/A;  ? ESOPHAGOGASTRODUODENOSCOPY (EGD) WITH PROPOFOL N/A 01/21/2020  ? Procedure: ESOPHAGOGASTRODUODENOSCOPY (EGD) WITH PROPOFOL;  Surgeon: Lavena Bullion, DO;  [facility]: [facility];  Service: Gastroenterology;  Laterality: N/A;  ? FRACTURE SURGERY  ~ 2005  ? nose  ? KNEE ARTHROSCOPY  09/04/2012  ? Procedure: ARTHROSCOPY KNEE;  Surgeon: Gearlean Alf, MD;  Location: [facility];  Service: Orthopedics;  Laterality: Right;  right knee arthroscopy with medial and lateral meniscus debridement  ? MELANOMA EXCISION Right 10/13/2016  ? right side of neck  ? MOUTH SURGERY  2004  ? "bone replacement; had cadavear bones put in; face was collapsing"  ? NASAL SEPTUM SURGERY  ~ 1986  ? TOTAL KNEE ARTHROPLASTY Right 07/07/2013  ? Procedure: RIGHT TOTAL KNEE ARTHROPLASTY;  Surgeon: Gearlean Alf, MD;  Location: [facility];  Service: Orthopedics;  Laterality: Right;  ? TRANSORAL INCISIONLESS FUNDOPLICATION N/A 02/22/8785  ? Procedure: TRANSORAL INCISIONLESS FUNDOPLICATION;  Surgeon: Lavena Bullion, DO;  [facility]: [facility];  Service: Gastroenterology;   Laterality: N/A;  ? UPPER GASTROINTESTINAL ENDOSCOPY    ?  ? ?Current Meds  ?Medication Sig  ? Alum & Mag Hydroxide-Simeth (MAALOX REGULAR STRENGTH PO) Take by mouth daily as needed. Patient takes 1 capful daily of the powder formulation.  ? aspirin EC 81 MG tablet Take 1 tablet (81 mg total) by mouth daily.  ? atorvastatin (LIPITOR) 80 MG tablet TAKE 1 TABLET BY MOUTH EVERY DAY  ? BLINK TEARS 0.25 % SOLN Place 1 drop into both eyes daily as needed (dry/irritated eyes.).  ? calcium-vitamin D (OSCAL WITH D) 500-200 MG-UNIT tablet Take 1 tablet by mouth 2 (two) times daily.  ? chlorthalidone (HYGROTON) 25 MG tablet TAKE 1 TABLET BY MOUTH EVERY DAY  ? CINNAMON PO Take 1,000 mg by mouth in the morning and at bedtime.  ? diphenhydrAMINE HCl ([employer]  ALLERGY PO) Take 1 tablet by mouth daily.  ? ferrous sulfate 325 (65 FE) MG tablet Take 325 mg by mouth daily.  ? gabapentin (NEURONTIN) 300 MG capsule TAKE 2 CAPSULES BY MOUTH AT BEDTIME  ? Insulin Pen Needle (NOVOFINE) 32G X 6 MM MISC USE TO INJECT VICTOZA DAILY. DX: E11.9  ? Lancet Device MISC Use to check blood sugar twice daily.  ? Lancets (FREESTYLE) lancets Use to check blood sugar twice daily  ? metFORMIN (GLUCOPHAGE-XR) 500 MG 24 hr tablet TAKE 2 TABLETS BY MOUTH TWICE A DAY  ? methylcellulose (CITRUCEL) oral powder Use as directed daily  ? metoprolol succinate (TOPROL-XL) 100 MG 24 hr tablet TAKE 1 TABLET BY MOUTH DAILY. TAKE WITH OR IMMEDIATELY FOLLOWING A MEAL.  ? Multiple Vitamins-Minerals (HAIR SKIN AND NAILS FORMULA PO) Take 1 tablet by mouth daily.  ? Multiple Vitamins-Minerals (PRESERVISION AREDS PO) Take 1 tablet by mouth 2 (two) times daily. Reported on 12/23/2015  ? MYRBETRIQ 25 MG TB24 tablet TAKE 1 TABLET BY MOUTH EVERY DAY  ? nitroGLYCERIN (NITROSTAT) 0.4 MG SL tablet PLACE 1 TABLET (0.4 MG TOTAL) UNDER THE TONGUE EVERY 5 (FIVE) MINUTES AS NEEDED FOR CHEST PAIN  ? omeprazole (PRILOSEC OTC) 20 MG tablet Take 20 mg by mouth daily.  ? ONETOUCH ULTRA test strip  USE TO CHECK BLOOD SUGAR TWICE DAILY  ? Probiotic Product (ALIGN PO) Take by mouth.  ? QUEtiapine (SEROQUEL) 25 MG tablet TAKE 1 TABLET BY MOUTH EVERYDAY AT BEDTIME  ? rOPINIRole (REQUIP) 1 MG tablet 1

## 2022-03-22 NOTE — Patient Instructions (Signed)
Medication Instructions:  ?Your physician recommends that you continue on your current medications as directed. Please refer to the Current Medication list given to you today. ? ?*If you need a refill on your cardiac medications before your next appointment, please call your pharmacy* ? ?Testing/Procedures: ?Your physician has recommended that you have a sleep study. This test records several body functions during sleep, including: brain activity, eye movement, oxygen and carbon dioxide blood levels, heart rate and rhythm, breathing rate and rhythm, the flow of air through your mouth and nose, snoring, body muscle movements, and chest and belly movement. ? ?Follow-Up: ?At [facility], you and your health needs are our priority.  As part of our continuing mission to provide you with exceptional heart care, we have created designated Provider Care Teams.  These Care Teams include your primary Cardiologist (physician) and Advanced Practice Providers (APPs -  Physician Assistants and Nurse Practitioners) who all work together to provide you with the care you need, when you need it. ? ?Follow up with Dr. Radford Pax based on results of your sleep study.  ? ? ?Important Information About Sugar ? ? ? ? ?  ?

## 2022-03-22 NOTE — Addendum Note (Signed)
Addended by: Antonieta Iba on: 03/22/2022 08:14 AM ? ? Modules accepted: Orders ? ?

## 2022-03-24 ENCOUNTER — Telehealth: Payer: Self-pay | Admitting: *Deleted

## 2022-03-24 NOTE — Telephone Encounter (Signed)
-----   Message from Antonieta Iba, RN sent at 03/22/2022  8:14 AM EDT ----- ?Good Morning Arbie Cookey! ?Dr. Radford Pax ordered an Jaynie Crumble for this patient during her virtual visit today. Will you set her up to come by and pick up a study? STOPBANG has been completed and was a 3.  ?Thanks so much! ?Carly ? ?

## 2022-03-24 NOTE — Telephone Encounter (Signed)
I s/w the pt and she is going to come in Tuesday 03/28/22 @ 10 am for Itamar set up.  ?

## 2022-03-28 NOTE — Telephone Encounter (Addendum)
Pt has been set up with Itamar sleep study. Pt aware to not open the device until she has been called with the PIN#. Pt aware if device needs to be returned and the box is opened she will be charged $100. Pt aware of approved and study not done in 30 days at the latest she will be charged $100. Pt agreeable to plan of care.  ? ?SET UP DATE 03/28/22 ?

## 2022-04-04 DIAGNOSIS — M65331 Trigger finger, right middle finger: Secondary | ICD-10-CM | POA: Diagnosis not present

## 2022-04-04 DIAGNOSIS — M65341 Trigger finger, right ring finger: Secondary | ICD-10-CM | POA: Diagnosis not present

## 2022-04-08 ENCOUNTER — Encounter (INDEPENDENT_AMBULATORY_CARE_PROVIDER_SITE_OTHER): Payer: Medicare PPO | Admitting: Cardiology

## 2022-04-08 DIAGNOSIS — G4733 Obstructive sleep apnea (adult) (pediatric): Secondary | ICD-10-CM

## 2022-04-10 ENCOUNTER — Ambulatory Visit: Payer: Medicare PPO

## 2022-04-10 DIAGNOSIS — G4733 Obstructive sleep apnea (adult) (pediatric): Secondary | ICD-10-CM

## 2022-04-10 NOTE — Procedures (Signed)
   SLEEP STUDY REPORT Patient Information Study Date: 04/08/22 Patient Name: Morgan [name] Patient ID: [id number] Birth Date: [birth year] Age: 81 Gender: Female BMI: 23.0 (W=134 lb, H=5' 4'') Neck Circ.: 29 '' Referring Physician: Fransico Him, MD  TEST DESCRIPTION: Home sleep apnea testing was completed using the WatchPat, a Type 1 device, utilizing  peripheral arterial tonometry (PAT), chest movement, actigraphy, pulse oximetry, pulse rate, body position and snore.  AHI was calculated with apnea and hypopnea using valid sleep time as the denominator. RDI includes apneas,  hypopneas, and RERAs. The data acquired and the scoring of sleep and all associated events were performed in  accordance with the recommended standards and specifications as outlined in the AASM Manual for the Scoring of  Sleep and Associated Events 2.2.0 (2015).   FINDINGS:  1. Mild Obstructive Sleep Apnea with AHI 10.3/hr.  2. No Central Sleep Apnea with pAHIc 0.5/hr.  3. Oxygen desaturations as low as 87%.  4. Mild to moderate snoring was present. O2 sats were < 88% for 0.2 min.  5. Total sleep time was 6 hrs and 34 min.  6. 9.4% of total sleep time was spent in REM sleep.  7. Shortened sleep onset latency at 6 min.  8. Prolonged REM sleep onset latency at 113 min.  9. Total awakenings were 15.   DIAGNOSIS: Mild Obstructive Sleep Apnea (G47.33)  RECOMMENDATIONS: 1. Clinical correlation of these findings is necessary. The decision to treat obstructive sleep apnea (OSA) is usually  based on the presence of apnea symptoms or the presence of associated medical conditions such as Hypertension,  Congestive Heart Failure, Atrial Fibrillation or Obesity. The most common symptoms of OSA are snoring, gasping for  breath while sleeping, daytime sleepiness and fatigue.   2. Initiating apnea therapy is recommended given the presence of symptoms and/or associated conditions.  Recommend proceeding with one of the  following:   a. Auto-CPAP therapy with a pressure range of 5-20cm H2O.   b. An oral appliance (OA) that can be obtained from certain dentists with expertise in sleep medicine. These are  primarily of use in non-obese patients with mild and moderate disease.   c. An ENT consultation which may be useful to look for specific causes of obstruction and possible treatment  options.   d. If patient is intolerant to PAP therapy, consider referral to ENT for evaluation for hypoglossal nerve stimulator.   3. Close follow-up is necessary to ensure success with CPAP or oral appliance therapy for maximum benefit .  4. A follow-up oximetry study on CPAP is recommended to assess the adequacy of therapy and determine the need  for supplemental oxygen or the potential need for Bi-level therapy. An arterial blood gas to determine the adequacy of  baseline ventilation and oxygenation should also be considered.  5. Healthy sleep recommendations include: adequate nightly sleep (normal 7-9 hrs/night), avoidance of caffeine after  noon and alcohol near bedtime, and maintaining a sleep environment that is cool, dark and quiet.  6. Weight loss for overweight patients is recommended. Even modest amounts of weight loss can significantly  improve the severity of sleep apnea.  7. Snoring recommendations include: weight loss where appropriate, side sleeping, and avoidance of alcohol before  bed.  8. Operation of motor vehicle should be avoided when sleepy.  Signature: Electronically Signed: 04/10/22 Fransico Him, MD; [facility]; [place], American Board of  Sleep Medicine

## 2022-04-18 ENCOUNTER — Encounter: Payer: Self-pay | Admitting: *Deleted

## 2022-04-18 ENCOUNTER — Telehealth: Payer: Self-pay | Admitting: *Deleted

## 2022-04-18 NOTE — Telephone Encounter (Signed)
The patient's husband has been notified of the result and will relay the message to his wife and. All questions (if any) were answered. Marolyn Hammock, [place] 04/18/2022 3:49 PM

## 2022-04-18 NOTE — Telephone Encounter (Signed)
-----   Message from Lauralee Evener, [place] sent at 04/11/2022  8:18 AM EDT -----  ----- Message ----- From: Sueanne Margarita, MD Sent: 04/10/2022   2:21 PM EDT To: [facility] Sleep Studies  Please let patient know that they have sleep apnea and recommend treating with CPAP.  Please order an auto CPAP from 4-15cm H2O with heated humidity and mask of choice.  Order overnight pulse ox on CPAP.  Followup with me in 6 weeks.

## 2022-04-18 NOTE — Telephone Encounter (Signed)
-----   Message from Lauralee Evener, [place] sent at 04/11/2022  8:18 AM EDT -----  ----- Message ----- From: Sueanne Margarita, MD Sent: 04/10/2022   2:21 PM EDT To: Cv Div Sleep Studies  Please let patient know that they have sleep apnea and recommend treating with CPAP.  Please order an auto CPAP from 4-15cm H2O with heated humidity and mask of choice.  Order overnight pulse ox on CPAP.  Followup with me in 6 weeks.

## 2022-04-18 NOTE — Telephone Encounter (Signed)
This encounter was created in error - please disregard.

## 2022-04-18 NOTE — Telephone Encounter (Signed)
From: Sueanne Margarita, MD  Sent: 04/10/2022   2:22 PM EDT  To: Cv [facility] Sleep Studies   Correction>> patient does not qualify for Inspire so stay on current CPAP settings

## 2022-04-19 NOTE — Telephone Encounter (Signed)
Prior Authorization for [facility] sent to [facility] via Phone. Reference # NO PRECERT REQUIRED FOR [facility] MEDICARE REF#  [id number] [facility] PH# [phone]

## 2022-04-19 NOTE — Telephone Encounter (Signed)
Message sent to [employer]. CMA to check on approval for [facility] study. Set up date was 03/28/22

## 2022-04-19 NOTE — Telephone Encounter (Signed)
I s/w the pt's husband who tells me that the pt did a sleep study last week. He then began to talk about that she did not qualify for Inspire. I did give him the PIN# for the pt ok to do the sleep study.   I further looked into CloudPat and I do see the pt did do her sleep study. I tried to call the pt's husband back, but he did not answer the phone. I wanted to apologize for my call that I do see the sleep study was done already and the pt does not need to call back.   I was then able to reach the pt's husband again and states pt does not need to call back. Pt's husband said thank you for the help.

## 2022-05-02 DIAGNOSIS — M65351 Trigger finger, right little finger: Secondary | ICD-10-CM | POA: Diagnosis not present

## 2022-05-02 DIAGNOSIS — M65331 Trigger finger, right middle finger: Secondary | ICD-10-CM | POA: Diagnosis not present

## 2022-05-02 DIAGNOSIS — M65341 Trigger finger, right ring finger: Secondary | ICD-10-CM | POA: Diagnosis not present

## 2022-05-07 ENCOUNTER — Other Ambulatory Visit: Payer: Self-pay | Admitting: Family Medicine

## 2022-05-09 ENCOUNTER — Ambulatory Visit: Payer: Medicare PPO | Admitting: Family Medicine

## 2022-05-12 ENCOUNTER — Ambulatory Visit: Payer: Medicare PPO | Admitting: Family Medicine

## 2022-05-17 ENCOUNTER — Telehealth: Payer: Self-pay | Admitting: Family Medicine

## 2022-05-17 ENCOUNTER — Other Ambulatory Visit (INDEPENDENT_AMBULATORY_CARE_PROVIDER_SITE_OTHER): Payer: Medicare PPO

## 2022-05-17 DIAGNOSIS — E1159 Type 2 diabetes mellitus with other circulatory complications: Secondary | ICD-10-CM

## 2022-05-17 DIAGNOSIS — G5601 Carpal tunnel syndrome, right upper limb: Secondary | ICD-10-CM | POA: Diagnosis not present

## 2022-05-17 DIAGNOSIS — R200 Anesthesia of skin: Secondary | ICD-10-CM | POA: Diagnosis not present

## 2022-05-17 LAB — HEMOGLOBIN A1C: Hgb A1c MFr Bld: 7.7 % — ABNORMAL HIGH (ref 4.6–6.5)

## 2022-05-17 NOTE — Progress Notes (Signed)
No critical labs need to be addressed urgently. We will discuss labs in detail at upcoming office visit.   

## 2022-05-17 NOTE — Telephone Encounter (Signed)
-----   Message from Ellamae Sia sent at 05/03/2022  3:51 PM EDT ----- Regarding: Lab orders for Wednesday, 7.523 Lab orders for a 3 month follow up appt.

## 2022-05-19 ENCOUNTER — Encounter: Payer: Self-pay | Admitting: *Deleted

## 2022-05-19 ENCOUNTER — Encounter: Payer: Self-pay | Admitting: Family Medicine

## 2022-05-19 ENCOUNTER — Ambulatory Visit: Payer: Medicare PPO | Admitting: Family Medicine

## 2022-05-19 VITALS — BP 130/68 | HR 60 | Temp 98.5°F | Ht 62.0 in | Wt 139.3 lb

## 2022-05-19 DIAGNOSIS — I152 Hypertension secondary to endocrine disorders: Secondary | ICD-10-CM | POA: Diagnosis not present

## 2022-05-19 DIAGNOSIS — F331 Major depressive disorder, recurrent, moderate: Secondary | ICD-10-CM | POA: Diagnosis not present

## 2022-05-19 DIAGNOSIS — F308 Other manic episodes: Secondary | ICD-10-CM

## 2022-05-19 DIAGNOSIS — E1141 Type 2 diabetes mellitus with diabetic mononeuropathy: Secondary | ICD-10-CM

## 2022-05-19 DIAGNOSIS — N3281 Overactive bladder: Secondary | ICD-10-CM

## 2022-05-19 DIAGNOSIS — E1159 Type 2 diabetes mellitus with other circulatory complications: Secondary | ICD-10-CM | POA: Diagnosis not present

## 2022-05-19 MED ORDER — TOLTERODINE TARTRATE ER 2 MG PO CP24: 2.0000 mg | ORAL_CAPSULE | Freq: Every day | ORAL | 3 refills | Status: DC

## 2022-05-19 NOTE — Patient Instructions (Addendum)
Get back on track with low carbohydrate diet, and regular exercise 3-5 days a week or water aerobics.  Start trial of Detrol LA.. watch for confusion or sedation... if not improving after 1-2 months.. call for med increase.

## 2022-05-19 NOTE — Assessment & Plan Note (Signed)
Chronic, poor control  She has failed a trial of 25 and 50 mg Myrbetriq for greater than 3 to 4 months.  We will try a trial of Detrol LA 2 mg daily.  We discussed possible side effects associated with this including sedation and confusion.  If she fails this course we can consider trial of 4 mg or referral to neurology for further evaluation.

## 2022-05-19 NOTE — Assessment & Plan Note (Signed)
Chronic well-controlled.  Continue current medication.

## 2022-05-19 NOTE — Assessment & Plan Note (Signed)
Chronic, improved control  She is doing better with sleep with the addition of Seroquel and it has improved her feelings of hypomania.  She will hold off on seeing a psychiatrist at this point unless her symptoms worsen.

## 2022-05-19 NOTE — Progress Notes (Signed)
VIRTUAL VISIT A virtual visit is felt to be most appropriate for this patient at this time.   I connected with the patient on 05/19/22 at  9:40 AM EDT by virtual telehealth platform and verified that I am speaking with the correct person using two identifiers.   I discussed the limitations, risks, security and privacy concerns of performing an evaluation and management service by  virtual telehealth platform and the availability of in person appointments. I also discussed with the patient that there may be a patient responsible charge related to this service. The patient expressed understanding and agreed to proceed.  Patient location: Home Provider Location: [place] Participants: Eliezer Lofts and [facility]   Chief Complaint  Patient presents with   Diabetes   Follow-up    MDD/GAD   Urinary Incontinence    Myrbetriq was ineffective   Noise in [employer] like a train when she sits still   Numbness    Bilateral feet and had ingrown toenail removed that just won't heal    History of Present Illness: 81 year old female returns for follow-up of multiple issues  Diabetes: Inadequate control of diabetes on max dose metformin. She has not been sticking to her diabetic diet and plans to restart. Lab Results  Component Value Date   HGBA1C 7.7 (H) 05/17/2022  Using medications without difficulties: Hypoglycemic episodes:none Hyperglycemic episodes:none Feet problems: She has numbness in her bilateral feet and has noted an ingrown toenail that was removed that just will not heal. For her neuropathy she is on gabapentin 300 mg 2 capsules at bedtime. Blood Sugars averaging: eye exam within last year: done  MDD, GAD, possible hypomania: Improved with Seroquel 25 mg at bedtime in addition to venlafaxine 75 mg p.o. daily.  She was referred to psychiatry for further evaluation but cannot get in for a while... she does not have a date for an appt.  PHQ9: 9 GAD7: 10  She  has noted freight train sound in both her ears when she is quiet. No ear pain no congestion  She continues to have issues with urinary incontinence and did not notice any improvement with Myrbetriq 25 or 50 mg daily.  She continues to have issues... she loose control of urine during day,   No burning with urination.   COVID 19 screen No recent travel or known exposure to COVID19 The patient denies respiratory symptoms of COVID 19 at this time.  The importance of social distancing was discussed today.   Review of Systems  Constitutional:  Negative for chills and fever.  HENT:  Negative for congestion and ear pain.   Eyes:  Negative for pain and redness.  Respiratory:  Negative for cough and shortness of breath.   Cardiovascular:  Negative for chest pain, palpitations and leg swelling.  Gastrointestinal:  Negative for abdominal pain, blood in stool, constipation, diarrhea, nausea and vomiting.  Genitourinary:  Negative for dysuria.  Musculoskeletal:  Negative for falls and myalgias.  Skin:  Negative for rash.  Neurological:  Negative for dizziness.  Psychiatric/Behavioral:  Negative for depression. The patient is not nervous/anxious.       Past Medical History:  Diagnosis Date   Angina    Arthritis    Atrial fibrillation ([place])    ASPIRIN FOR BLOOD THINNER   Atypical mole 09/22/2016   Bulging discs    lumbar    Complication of anesthesia    pt states has choking sensation with ET tube    Coronary  artery disease    Depression    Diabetes mellitus    diet controlled/on meds   Family history of breast cancer    Family history of colon cancer    Fibromyalgia    GERD (gastroesophageal reflux disease)    H/O hiatal hernia    Headache(784.0)    "recurring"   High cholesterol    History of colon polyps    Hyperlipidemia    Hypertension    [place] esophagus    Melanoma ([place])    melanoma   Migraines    "til ~ 1980"   PONV (postoperative nausea and vomiting)    Restless  leg syndrome    Sciatic nerve pain    "from pinched nerve"   Sleep apnea    uses CPAP   Stroke ([place]) 2014   no deficits   Weakness of right side of body    "I've had PT for it; they don't know what it's from".  CORTISONE INJECTION INTO BACK 08/30/12    reports that she has never smoked. She has never used smokeless tobacco. She reports current alcohol use of about 1.0 standard drink of alcohol per week. She reports that she does not use drugs.   Current Outpatient Medications:    Alum & Mag Hydroxide-Simeth (MAALOX REGULAR STRENGTH PO), Take by mouth daily as needed. Patient takes 1 capful daily of the powder formulation., Disp: , Rfl:    aspirin EC 81 MG tablet, Take 1 tablet (81 mg total) by mouth daily., Disp: , Rfl:    atorvastatin (LIPITOR) 80 MG tablet, TAKE 1 TABLET BY MOUTH EVERY DAY, Disp: 90 tablet, Rfl: 3   BLINK TEARS 0.25 % SOLN, Place 1 drop into both eyes daily as needed (dry/irritated eyes.)., Disp: , Rfl:    calcium-vitamin D (OSCAL WITH D) 500-200 MG-UNIT tablet, Take 1 tablet by mouth 2 (two) times daily., Disp: , Rfl:    chlorthalidone (HYGROTON) 25 MG tablet, TAKE 1 TABLET BY MOUTH EVERY DAY, Disp: 90 tablet, Rfl: 3   CINNAMON PO, Take 1,000 mg by mouth in the morning and at bedtime., Disp: , Rfl:    diphenhydrAMINE HCl ([employer] ALLERGY PO), Take 1 tablet by mouth daily., Disp: , Rfl:    ferrous sulfate 325 (65 FE) MG tablet, Take 325 mg by mouth daily., Disp: , Rfl:    gabapentin (NEURONTIN) 300 MG capsule, TAKE 2 CAPSULES BY MOUTH AT BEDTIME, Disp: 180 capsule, Rfl: 1   Lancet Device MISC, Use to check blood sugar twice daily., Disp: 1 each, Rfl: 0   Lancets (FREESTYLE) lancets, Use to check blood sugar twice daily, Disp: 100 each, Rfl: 11   metFORMIN (GLUCOPHAGE-XR) 500 MG 24 hr tablet, TAKE 2 TABLETS BY MOUTH TWICE A DAY, Disp: 360 tablet, Rfl: 3   methylcellulose (CITRUCEL) oral powder, Use as directed daily, Disp: , Rfl:    metoprolol succinate (TOPROL-XL) 100 MG 24  hr tablet, TAKE 1 TABLET BY MOUTH DAILY. TAKE WITH OR IMMEDIATELY FOLLOWING A MEAL., Disp: 90 tablet, Rfl: 3   Multiple Vitamins-Minerals (HAIR SKIN AND NAILS FORMULA PO), Take 1 tablet by mouth daily., Disp: , Rfl:    Multiple Vitamins-Minerals (PRESERVISION AREDS PO), Take 1 tablet by mouth 2 (two) times daily. Reported on 12/23/2015, Disp: , Rfl:    nitroGLYCERIN (NITROSTAT) 0.4 MG SL tablet, PLACE 1 TABLET (0.4 MG TOTAL) UNDER THE TONGUE EVERY 5 (FIVE) MINUTES AS NEEDED FOR CHEST PAIN, Disp: 25 tablet, Rfl: 0   omeprazole (PRILOSEC OTC) 20 MG  tablet, Take 20 mg by mouth daily., Disp: , Rfl:    ONETOUCH ULTRA test strip, USE TO CHECK BLOOD SUGAR TWICE DAILY, Disp: 150 strip, Rfl: 3   Probiotic Product (ALIGN PO), Take by mouth., Disp: , Rfl:    QUEtiapine (SEROQUEL) 25 MG tablet, TAKE 1 TABLET BY MOUTH EVERYDAY AT BEDTIME, Disp: 90 tablet, Rfl: 0   rOPINIRole (REQUIP) 1 MG tablet, 1/2 tablet during the day, 2 tablets at bedtime, Disp: 90 tablet, Rfl: 11   venlafaxine XR (EFFEXOR-XR) 75 MG 24 hr capsule, TAKE 1 CAPSULE BY MOUTH DAILY WITH BREAKFAST., Disp: 90 capsule, Rfl: 0   vitamin B-12 (CYANOCOBALAMIN) 1000 MCG tablet, Take 1,000 mcg by mouth daily., Disp: , Rfl:    Observations/Objective: Blood pressure 130/68, pulse 60, temperature 98.5 F (36.9 C), temperature source Oral, height '5\' 2"'$  (1.575 m), weight 139 lb 5 oz (63.2 kg), SpO2 97 %.  Physical Exam Constitutional:      General: She is not in acute distress.    Appearance: Normal appearance. She is well-developed. She is not ill-appearing or toxic-appearing.  HENT:     Head: Normocephalic.     Right Ear: Hearing, tympanic membrane, ear canal and external ear normal. Tympanic membrane is not erythematous, retracted or bulging.     Left Ear: Hearing, tympanic membrane, ear canal and external ear normal. Tympanic membrane is not erythematous, retracted or bulging.     Nose: No mucosal edema or rhinorrhea.     Right Sinus: No maxillary  sinus tenderness or frontal sinus tenderness.     Left Sinus: No maxillary sinus tenderness or frontal sinus tenderness.     Mouth/Throat:     Pharynx: Uvula midline.  Eyes:     General: Lids are normal. Lids are everted, no foreign bodies appreciated.     Conjunctiva/sclera: Conjunctivae normal.     Pupils: Pupils are equal, round, and reactive to light.  Neck:     Thyroid: No thyroid mass or thyromegaly.     Vascular: No carotid bruit.     Trachea: Trachea normal.  Cardiovascular:     Rate and Rhythm: Normal rate and regular rhythm.     Pulses: Normal pulses.     Heart sounds: Normal heart sounds, S1 normal and S2 normal. No murmur heard.    No friction rub. No gallop.  Pulmonary:     Effort: Pulmonary effort is normal. No tachypnea or respiratory distress.     Breath sounds: Normal breath sounds. No decreased breath sounds, wheezing, rhonchi or rales.  Abdominal:     General: Bowel sounds are normal.     Palpations: Abdomen is soft.     Tenderness: There is no abdominal tenderness.  Musculoskeletal:     Cervical back: Normal range of motion and neck supple.  Skin:    General: Skin is warm and dry.     Findings: No rash.  Neurological:     Mental Status: She is alert.  Psychiatric:        Mood and Affect: Mood is not anxious or depressed.        Speech: Speech normal.        Behavior: Behavior normal. Behavior is cooperative.        Thought Content: Thought content normal.        Judgment: Judgment normal.   Left great toe nail  where removed, mild erythema (pink) no increased warmth no fluctuation Per patient this was removed 2 months ago.  I have encouraged her  to follow-up with podiatry   Assessment and Plan    I discussed the assessment and treatment plan with the patient. The patient was provided an opportunity to ask questions and all were answered. The patient agreed with the plan and demonstrated an understanding of the instructions.   The patient was advised  to call back or seek an in-person evaluation if the symptoms worsen or if the condition fails to improve as anticipated.    Problem List Items Addressed This Visit     Hypertension associated with diabetes ([place]) (Chronic)    Chronic well-controlled.  Continue current medication.      MDD (major depressive disorder), recurrent episode, moderate (HCC) (Chronic)    Chronic, improved control  She is doing better with sleep with the addition of Seroquel and it has improved her feelings of hypomania.  She will hold off on seeing a psychiatrist at this point unless her symptoms worsen.      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  She will get back on track.  She will continue metformin at the max dose.  We will reevaluate in 3 months.      Diabetic mononeuropathy associated with type 2 diabetes mellitus ([place])   Hypomania (HCC)   OAB (overactive bladder)    Chronic, poor control  She has failed a trial of 25 and 50 mg Myrbetriq for greater than 3 to 4 months.  We will try a trial of Detrol LA 2 mg daily.  We discussed possible side effects associated with this including sedation and confusion.  If she fails this course we can consider trial of 4 mg or referral to neurology for further evaluation.      Meds ordered this encounter  Medications   tolterodine (DETROL LA) 2 MG 24 hr capsule    Sig: Take 1 capsule (2 mg total) by mouth daily.    Dispense:  30 capsule    Refill:  3      Eliezer Lofts, MD

## 2022-05-19 NOTE — Assessment & Plan Note (Signed)
Chronic, worsening control  She has not been exercising and has not been paying attention to her diet recently.  She will get back on track.  She will continue metformin at the max dose.  We will reevaluate in 3 months.

## 2022-05-23 DIAGNOSIS — M65341 Trigger finger, right ring finger: Secondary | ICD-10-CM | POA: Diagnosis not present

## 2022-05-23 DIAGNOSIS — M65351 Trigger finger, right little finger: Secondary | ICD-10-CM | POA: Diagnosis not present

## 2022-05-23 DIAGNOSIS — R200 Anesthesia of skin: Secondary | ICD-10-CM | POA: Diagnosis not present

## 2022-05-25 ENCOUNTER — Other Ambulatory Visit: Payer: Self-pay | Admitting: Family Medicine

## 2022-05-31 DIAGNOSIS — Z85820 Personal history of malignant melanoma of skin: Secondary | ICD-10-CM | POA: Diagnosis not present

## 2022-05-31 DIAGNOSIS — D2262 Melanocytic nevi of left upper limb, including shoulder: Secondary | ICD-10-CM | POA: Diagnosis not present

## 2022-05-31 DIAGNOSIS — D2271 Melanocytic nevi of right lower limb, including hip: Secondary | ICD-10-CM | POA: Diagnosis not present

## 2022-05-31 DIAGNOSIS — D2261 Melanocytic nevi of right upper limb, including shoulder: Secondary | ICD-10-CM | POA: Diagnosis not present

## 2022-05-31 DIAGNOSIS — L821 Other seborrheic keratosis: Secondary | ICD-10-CM | POA: Diagnosis not present

## 2022-05-31 DIAGNOSIS — D225 Melanocytic nevi of trunk: Secondary | ICD-10-CM | POA: Diagnosis not present

## 2022-05-31 DIAGNOSIS — L812 Freckles: Secondary | ICD-10-CM | POA: Diagnosis not present

## 2022-05-31 DIAGNOSIS — D2272 Melanocytic nevi of left lower limb, including hip: Secondary | ICD-10-CM | POA: Diagnosis not present

## 2022-06-05 ENCOUNTER — Other Ambulatory Visit: Payer: Self-pay | Admitting: Family Medicine

## 2022-06-05 NOTE — Telephone Encounter (Signed)
Last office visit 05/19/22 for DM, Urinary Incontinence, Noise in Head and Numbness.  Last refilled 03/05/22 for #90 with no refills.  Next Appt: 08/22/22 for DM.

## 2022-06-16 DIAGNOSIS — M65341 Trigger finger, right ring finger: Secondary | ICD-10-CM | POA: Diagnosis not present

## 2022-06-16 DIAGNOSIS — G5601 Carpal tunnel syndrome, right upper limb: Secondary | ICD-10-CM | POA: Diagnosis not present

## 2022-06-16 DIAGNOSIS — M65351 Trigger finger, right little finger: Secondary | ICD-10-CM | POA: Diagnosis not present

## 2022-06-16 DIAGNOSIS — M65331 Trigger finger, right middle finger: Secondary | ICD-10-CM | POA: Diagnosis not present

## 2022-06-16 DIAGNOSIS — M720 Palmar fascial fibromatosis [Dupuytren]: Secondary | ICD-10-CM | POA: Diagnosis not present

## 2022-06-20 DIAGNOSIS — M65331 Trigger finger, right middle finger: Secondary | ICD-10-CM | POA: Diagnosis not present

## 2022-06-20 DIAGNOSIS — M65351 Trigger finger, right little finger: Secondary | ICD-10-CM | POA: Diagnosis not present

## 2022-06-20 DIAGNOSIS — M65341 Trigger finger, right ring finger: Secondary | ICD-10-CM | POA: Diagnosis not present

## 2022-06-20 DIAGNOSIS — S6411XD Injury of median nerve at wrist and hand level of right arm, subsequent encounter: Secondary | ICD-10-CM | POA: Diagnosis not present

## 2022-06-29 DIAGNOSIS — M65331 Trigger finger, right middle finger: Secondary | ICD-10-CM | POA: Diagnosis not present

## 2022-06-29 DIAGNOSIS — S6411XD Injury of median nerve at wrist and hand level of right arm, subsequent encounter: Secondary | ICD-10-CM | POA: Diagnosis not present

## 2022-06-29 DIAGNOSIS — M65351 Trigger finger, right little finger: Secondary | ICD-10-CM | POA: Diagnosis not present

## 2022-06-29 DIAGNOSIS — M65341 Trigger finger, right ring finger: Secondary | ICD-10-CM | POA: Diagnosis not present

## 2022-07-03 ENCOUNTER — Other Ambulatory Visit: Payer: Self-pay | Admitting: Family Medicine

## 2022-07-03 NOTE — Telephone Encounter (Signed)
Refill request Gabapentin Last refill 10/20/21 #180/1 Last office visit 05/19/22

## 2022-07-04 DIAGNOSIS — M65331 Trigger finger, right middle finger: Secondary | ICD-10-CM | POA: Diagnosis not present

## 2022-07-04 DIAGNOSIS — M65351 Trigger finger, right little finger: Secondary | ICD-10-CM | POA: Diagnosis not present

## 2022-07-04 DIAGNOSIS — M65341 Trigger finger, right ring finger: Secondary | ICD-10-CM | POA: Diagnosis not present

## 2022-07-04 DIAGNOSIS — S6411XD Injury of median nerve at wrist and hand level of right arm, subsequent encounter: Secondary | ICD-10-CM | POA: Diagnosis not present

## 2022-07-07 DIAGNOSIS — M65341 Trigger finger, right ring finger: Secondary | ICD-10-CM | POA: Diagnosis not present

## 2022-07-07 DIAGNOSIS — S6411XD Injury of median nerve at wrist and hand level of right arm, subsequent encounter: Secondary | ICD-10-CM | POA: Diagnosis not present

## 2022-07-07 DIAGNOSIS — M65331 Trigger finger, right middle finger: Secondary | ICD-10-CM | POA: Diagnosis not present

## 2022-07-07 DIAGNOSIS — M65351 Trigger finger, right little finger: Secondary | ICD-10-CM | POA: Diagnosis not present

## 2022-07-12 DIAGNOSIS — M65351 Trigger finger, right little finger: Secondary | ICD-10-CM | POA: Diagnosis not present

## 2022-07-12 DIAGNOSIS — M65341 Trigger finger, right ring finger: Secondary | ICD-10-CM | POA: Diagnosis not present

## 2022-07-12 DIAGNOSIS — M65331 Trigger finger, right middle finger: Secondary | ICD-10-CM | POA: Diagnosis not present

## 2022-07-12 DIAGNOSIS — S6411XD Injury of median nerve at wrist and hand level of right arm, subsequent encounter: Secondary | ICD-10-CM | POA: Diagnosis not present

## 2022-07-14 DIAGNOSIS — M65351 Trigger finger, right little finger: Secondary | ICD-10-CM | POA: Diagnosis not present

## 2022-07-14 DIAGNOSIS — S6411XD Injury of median nerve at wrist and hand level of right arm, subsequent encounter: Secondary | ICD-10-CM | POA: Diagnosis not present

## 2022-07-14 DIAGNOSIS — M65341 Trigger finger, right ring finger: Secondary | ICD-10-CM | POA: Diagnosis not present

## 2022-07-14 DIAGNOSIS — M65331 Trigger finger, right middle finger: Secondary | ICD-10-CM | POA: Diagnosis not present

## 2022-07-19 DIAGNOSIS — M65341 Trigger finger, right ring finger: Secondary | ICD-10-CM | POA: Diagnosis not present

## 2022-07-19 DIAGNOSIS — S6411XD Injury of median nerve at wrist and hand level of right arm, subsequent encounter: Secondary | ICD-10-CM | POA: Diagnosis not present

## 2022-07-19 DIAGNOSIS — M65331 Trigger finger, right middle finger: Secondary | ICD-10-CM | POA: Diagnosis not present

## 2022-07-19 DIAGNOSIS — M65351 Trigger finger, right little finger: Secondary | ICD-10-CM | POA: Diagnosis not present

## 2022-07-25 ENCOUNTER — Telehealth: Payer: Self-pay | Admitting: Cardiology

## 2022-07-25 DIAGNOSIS — G4733 Obstructive sleep apnea (adult) (pediatric): Secondary | ICD-10-CM

## 2022-07-25 DIAGNOSIS — I2510 Atherosclerotic heart disease of native coronary artery without angina pectoris: Secondary | ICD-10-CM

## 2022-07-25 DIAGNOSIS — R5383 Other fatigue: Secondary | ICD-10-CM

## 2022-07-25 NOTE — Telephone Encounter (Signed)
Pt calling stating her CPAP is now broke and needs to know where to take it to get it fixed or have an order placed for a new one

## 2022-07-26 NOTE — Telephone Encounter (Signed)
Patient was advised to call her [name] and make an appointment to have her cpap looked at and or fixed. I will send a message to Dr Fransico Him to write a new Rx because her machine is over 80 years old.

## 2022-07-27 NOTE — Telephone Encounter (Signed)
Order placed to [facility] via community message. 

## 2022-07-27 NOTE — Addendum Note (Signed)
Addended by: Freada Bergeron on: 07/27/2022 12:49 PM   Modules accepted: Orders

## 2022-07-29 ENCOUNTER — Emergency Department (HOSPITAL_COMMUNITY)
Admission: EM | Admit: 2022-07-29 | Discharge: 2022-07-29 | Disposition: A | Discharge status: Home / Self Care | Payer: Medicare PPO | Attending: Emergency Medicine | Admitting: Emergency Medicine

## 2022-07-29 ENCOUNTER — Other Ambulatory Visit: Payer: Self-pay

## 2022-07-29 ENCOUNTER — Emergency Department (HOSPITAL_COMMUNITY): Payer: Medicare PPO

## 2022-07-29 ENCOUNTER — Encounter (HOSPITAL_COMMUNITY): Payer: Self-pay

## 2022-07-29 DIAGNOSIS — Z79899 Other long term (current) drug therapy: Secondary | ICD-10-CM | POA: Diagnosis not present

## 2022-07-29 DIAGNOSIS — I2510 Atherosclerotic heart disease of native coronary artery without angina pectoris: Secondary | ICD-10-CM | POA: Insufficient documentation

## 2022-07-29 DIAGNOSIS — S0240CA Maxillary fracture, right side, initial encounter for closed fracture: Secondary | ICD-10-CM | POA: Insufficient documentation

## 2022-07-29 DIAGNOSIS — Y92000 Kitchen of unspecified non-institutional (private) residence as  the place of occurrence of the external cause: Secondary | ICD-10-CM | POA: Insufficient documentation

## 2022-07-29 DIAGNOSIS — S022XXA Fracture of nasal bones, initial encounter for closed fracture: Secondary | ICD-10-CM | POA: Diagnosis not present

## 2022-07-29 DIAGNOSIS — T887XXA Unspecified adverse effect of drug or medicament, initial encounter: Secondary | ICD-10-CM | POA: Diagnosis not present

## 2022-07-29 DIAGNOSIS — M4802 Spinal stenosis, cervical region: Secondary | ICD-10-CM | POA: Diagnosis not present

## 2022-07-29 DIAGNOSIS — R42 Dizziness and giddiness: Secondary | ICD-10-CM | POA: Diagnosis not present

## 2022-07-29 DIAGNOSIS — S12591A Other nondisplaced fracture of sixth cervical vertebra, initial encounter for closed fracture: Secondary | ICD-10-CM | POA: Insufficient documentation

## 2022-07-29 DIAGNOSIS — R739 Hyperglycemia, unspecified: Secondary | ICD-10-CM | POA: Diagnosis not present

## 2022-07-29 DIAGNOSIS — R231 Pallor: Secondary | ICD-10-CM | POA: Diagnosis not present

## 2022-07-29 DIAGNOSIS — M549 Dorsalgia, unspecified: Secondary | ICD-10-CM | POA: Diagnosis not present

## 2022-07-29 DIAGNOSIS — R55 Syncope and collapse: Secondary | ICD-10-CM | POA: Insufficient documentation

## 2022-07-29 DIAGNOSIS — Z7982 Long term (current) use of aspirin: Secondary | ICD-10-CM | POA: Diagnosis not present

## 2022-07-29 DIAGNOSIS — S01511A Laceration without foreign body of lip, initial encounter: Secondary | ICD-10-CM | POA: Diagnosis not present

## 2022-07-29 DIAGNOSIS — Z7984 Long term (current) use of oral hypoglycemic drugs: Secondary | ICD-10-CM | POA: Insufficient documentation

## 2022-07-29 DIAGNOSIS — J90 Pleural effusion, not elsewhere classified: Secondary | ICD-10-CM | POA: Diagnosis not present

## 2022-07-29 DIAGNOSIS — S0292XA Unspecified fracture of facial bones, initial encounter for closed fracture: Secondary | ICD-10-CM | POA: Diagnosis not present

## 2022-07-29 DIAGNOSIS — S12500A Unspecified displaced fracture of sixth cervical vertebra, initial encounter for closed fracture: Secondary | ICD-10-CM | POA: Diagnosis not present

## 2022-07-29 DIAGNOSIS — I10 Essential (primary) hypertension: Secondary | ICD-10-CM | POA: Insufficient documentation

## 2022-07-29 DIAGNOSIS — S0292XB Unspecified fracture of facial bones, initial encounter for open fracture: Secondary | ICD-10-CM

## 2022-07-29 DIAGNOSIS — W19XXXA Unspecified fall, initial encounter: Secondary | ICD-10-CM | POA: Diagnosis not present

## 2022-07-29 DIAGNOSIS — E119 Type 2 diabetes mellitus without complications: Secondary | ICD-10-CM | POA: Insufficient documentation

## 2022-07-29 DIAGNOSIS — S0993XA Unspecified injury of face, initial encounter: Secondary | ICD-10-CM | POA: Diagnosis present

## 2022-07-29 DIAGNOSIS — M2578 Osteophyte, vertebrae: Secondary | ICD-10-CM | POA: Diagnosis not present

## 2022-07-29 DIAGNOSIS — T1490XA Injury, unspecified, initial encounter: Secondary | ICD-10-CM | POA: Diagnosis not present

## 2022-07-29 LAB — CBC WITH DIFFERENTIAL/PLATELET
Abs Immature Granulocytes: 0.03 10*3/uL (ref 0.00–0.07)
Basophils Absolute: 0 10*3/uL (ref 0.0–0.1)
Basophils Relative: 0 %
Eosinophils Absolute: 0.1 10*3/uL (ref 0.0–0.5)
Eosinophils Relative: 2 %
HCT: 36 % (ref 36.0–46.0)
Hemoglobin: 11.6 g/dL — ABNORMAL LOW (ref 12.0–15.0)
Immature Granulocytes: 0 %
Lymphocytes Relative: 12 %
Lymphs Abs: 1.1 10*3/uL (ref 0.7–4.0)
MCH: 31.3 pg (ref 26.0–34.0)
MCHC: 32.2 g/dL (ref 30.0–36.0)
MCV: 97 fL (ref 80.0–100.0)
Monocytes Absolute: 0.7 10*3/uL (ref 0.1–1.0)
Monocytes Relative: 8 %
Neutro Abs: 6.9 10*3/uL (ref 1.7–7.7)
Neutrophils Relative %: 78 %
Platelets: 254 10*3/uL (ref 150–400)
RBC: 3.71 MIL/uL — ABNORMAL LOW (ref 3.87–5.11)
RDW: 12 % (ref 11.5–15.5)
WBC: 8.8 10*3/uL (ref 4.0–10.5)
nRBC: 0 % (ref 0.0–0.2)

## 2022-07-29 LAB — COMPREHENSIVE METABOLIC PANEL
ALT: 28 U/L (ref 0–44)
AST: 29 U/L (ref 15–41)
Albumin: 3.7 g/dL (ref 3.5–5.0)
Alkaline Phosphatase: 67 U/L (ref 38–126)
Anion gap: 13 (ref 5–15)
BUN: 36 mg/dL — ABNORMAL HIGH (ref 8–23)
CO2: 23 mmol/L (ref 22–32)
Calcium: 9 mg/dL (ref 8.9–10.3)
Chloride: 105 mmol/L (ref 98–111)
Creatinine, Ser: 1.28 mg/dL — ABNORMAL HIGH (ref 0.44–1.00)
GFR, Estimated: 42 mL/min — ABNORMAL LOW (ref >60)
Glucose, Bld: 231 mg/dL — ABNORMAL HIGH (ref 70–99)
Potassium: 3.7 mmol/L (ref 3.5–5.1)
Sodium: 141 mmol/L (ref 135–145)
Total Bilirubin: 0.7 mg/dL (ref 0.3–1.2)
Total Protein: 5.9 g/dL — ABNORMAL LOW (ref 6.5–8.1)

## 2022-07-29 LAB — TROPONIN I (HIGH SENSITIVITY)
Troponin I (High Sensitivity): 7 ng/L (ref <18)
Troponin I (High Sensitivity): 9 ng/L (ref <18)

## 2022-07-29 LAB — CBG MONITORING, ED: Glucose-Capillary: 203 mg/dL — ABNORMAL HIGH (ref 70–99)

## 2022-07-29 MED ORDER — SODIUM CHLORIDE 0.9 % IV SOLN: INTRAVENOUS | Status: DC

## 2022-07-29 MED ORDER — AMOXICILLIN-POT CLAVULANATE 875-125 MG PO TABS: 1.0000 | ORAL_TABLET | Freq: Two times a day (BID) | ORAL | 0 refills | Status: DC

## 2022-07-29 MED ORDER — LIDOCAINE-EPINEPHRINE (PF) 2 %-1:200000 IJ SOLN
20.0000 mL | Freq: Once | INTRAMUSCULAR | Status: AC
  Administered 2022-07-29: 20 mL via INTRADERMAL
  Filled 2022-07-29: qty 20

## 2022-07-29 NOTE — ED Provider Notes (Signed)
[facility] EMERGENCY DEPARTMENT Provider Note  CSN: [id number] Arrival date & time: 07/29/22 0046  Chief Complaint(s) Loss of Consciousness and Fall  HPI Leonila Speranza is a 81 y.o. 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  This occurred around 1030-11 PM.  Patient reports that she took her nighttime medicine which included Requip, Seroquel, and tramadol around 9 PM.  Reports that she was sitting down eating dinner around 1030p.  When she stood to walk to the kitchen she felt generally fatigued and drained.  While walking to the kitchen, this worsened.  She was putting the plate into the dishwasher when she passed out.  She remembers waking up on the floor in a pool of blood.    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  States that she ate breakfast but did not eat anything else throughout the day.  She did state that she was drinking some water.  Upon EMS arrival patient was noted to be hypotensive which improved with IV fluids in route.  Denies any recent fevers or infections.  No coughing or congestion.  She denied any associated chest pain or shortness of breath.   Patient is endorsing moderate facial pain and mouth pain.  She is reporting upper back and neck pain.  She also has pain in the right hand that has been there since her carpal tunnel surgery.  The history is provided by the patient and a relative.    Past Medical History Past Medical History:  Diagnosis Date   Angina    Arthritis    Atrial fibrillation ([place])    ASPIRIN FOR BLOOD THINNER   Atypical mole 09/22/2016   Bulging discs    lumbar    Complication of anesthesia    pt states has choking sensation with ET tube     Coronary artery disease    Depression    Diabetes mellitus    diet controlled/on meds   Family history of breast cancer    Family history of colon cancer    Fibromyalgia    GERD (gastroesophageal reflux disease)    H/O hiatal hernia    Headache(784.0)    "recurring"   High cholesterol    History of colon polyps    Hyperlipidemia    Hypertension    Jackhammer esophagus    Melanoma ([place])    melanoma   Migraines    "til ~ 1980"   PONV (postoperative nausea and vomiting)    Restless leg syndrome    Sciatic nerve pain    "from pinched nerve"   Sleep apnea    uses CPAP   Stroke ([place]) 2014   no deficits   Weakness of right side of body    "I've had PT for it; they don't know what it's from".  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[place]) 02/10/2022   Weight loss 02/10/2022   Insomnia 08/16/2021   Iron deficiency anemia 08/16/2021   OAB (overactive bladder) 08/16/2021   Hyperlipidemia  08/04/2021   Scalp pain 07/21/2021   Concussion with no loss of consciousness 02/10/2021   Hiatal hernia with GERD 01/21/2020   S/P laparoscopic fundoplication 16/08/9603   Globus sensation    Family history of breast cancer    History of colon polyps    Chronic diarrhea 01/14/2019   Malignant melanoma ([place]) 11/23/2016   Urge incontinence 11/23/2016   Left foot pain 12/31/2014   Osteopenia 09/15/2014   History of TIA (transient ischemic attack) 05/19/2014   Ingrown toenail 01/15/2014   Obesity (BMI 30-39.9) 10/08/2013   OA (osteoarthritis) of knee 07/07/2013   Chronic low back pain 05/09/2013   Obstructive sleep apnea 05/09/2013   GERD (gastroesophageal reflux disease) 05/09/2013   Hiatal hernia 05/09/2013   Fibromyalgia syndrome 05/09/2013   MDD  (major depressive disorder), recurrent episode, moderate (HCC) 05/09/2013   RLS (restless legs syndrome) 05/09/2013   Type 2 diabetes mellitus with vascular disease ([place]) 05/09/2013   Hyperlipidemia associated with type 2 diabetes mellitus (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 Authorizing Provider  amoxicillin-clavulanate (AUGMENTIN) 875-125 MG tablet Take 1 tablet by mouth every 12 (twelve) hours. 07/29/22  Yes Avaleen Brownley, Grayce Sessions, MD  QUEtiapine (SEROQUEL) 25 MG tablet TAKE 1 TABLET BY MOUTH EVERYDAY AT BEDTIME 06/05/22   Bedsole, Amy E, MD  Alum & Mag Hydroxide-Simeth (MAALOX REGULAR STRENGTH PO) Take by mouth daily as needed. Patient takes 1 capful daily of the powder formulation.    [provider]  aspirin EC 81 MG tablet Take 1 tablet (81 mg total) by mouth daily. 10/01/18   Jerline Pain, MD  atorvastatin (LIPITOR) 80 MG tablet TAKE 1 TABLET BY MOUTH EVERY DAY 08/23/21   Jerline Pain, MD  BLINK TEARS 0.25 % SOLN Place 1 drop into both eyes daily as needed (dry/irritated eyes.).    [provider]  calcium-vitamin D (OSCAL WITH D) 500-200 MG-UNIT tablet Take 1 tablet by mouth 2 (two) times daily.    [provider]  chlorthalidone (HYGROTON) 25 MG tablet TAKE 1 TABLET BY MOUTH EVERY DAY 03/19/22   Bedsole, Amy E, MD  CINNAMON PO Take 1,000 mg by mouth in the morning and at bedtime.    [provider]  diphenhydrAMINE HCl ([employer] ALLERGY PO) Take 1 tablet by mouth daily.    [provider]  ferrous sulfate 325 (65 FE) MG tablet Take 325 mg by mouth daily.    [provider]   gabapentin (NEURONTIN) 300 MG capsule TAKE 2 CAPSULES BY MOUTH AT BEDTIME 07/04/22   Bedsole, Amy E, MD  Lancet Device MISC Use to check blood sugar twice daily. 10/16/18   Bedsole, Amy E, MD  Lancets (FREESTYLE) lancets Use to check blood sugar twice daily 10/16/18   Bedsole, Amy E, MD  metFORMIN (GLUCOPHAGE-XR) 500 MG 24 hr tablet TAKE 2 TABLETS BY MOUTH TWICE A DAY 03/19/22   Bedsole, Amy E, MD  methylcellulose (CITRUCEL) oral powder Use as directed daily 03/31/21   Armbruster, Carlota Raspberry, MD  metoprolol succinate (TOPROL-XL) 100 MG 24 hr tablet TAKE 1 TABLET BY MOUTH DAILY. TAKE WITH OR IMMEDIATELY FOLLOWING A MEAL. 02/24/22   Bedsole, Amy E,  MD  Multiple Vitamins-Minerals (HAIR SKIN AND NAILS FORMULA PO) Take 1 tablet by mouth daily.    [provider]  Multiple Vitamins-Minerals (PRESERVISION AREDS PO) Take 1 tablet by mouth 2 (two) times daily. Reported on 12/23/2015    [provider]  nitroGLYCERIN (NITROSTAT) 0.4 MG SL tablet PLACE 1 TABLET (0.4 MG TOTAL) UNDER THE TONGUE EVERY 5 (FIVE) MINUTES AS NEEDED FOR CHEST PAIN 08/10/20   Diona Browner, Amy E, MD  omeprazole (PRILOSEC OTC) 20 MG tablet Take 20 mg by mouth daily.    [provider]  [facility] ULTRA test strip USE TO CHECK BLOOD SUGAR TWICE DAILY 08/13/19   Jinny Sanders, MD  Probiotic Product (ALIGN PO) Take by mouth.    [provider]  rOPINIRole (REQUIP) 1 MG tablet 1/2 tablet during the day, 2 tablets at bedtime 11/29/21   Bedsole, Amy E, MD  tolterodine (DETROL LA) 2 MG 24 hr capsule Take 1 capsule (2 mg total) by mouth daily. 05/19/22   Bedsole, Amy E, MD  venlafaxine XR (EFFEXOR-XR) 75 MG 24 hr capsule TAKE 1 CAPSULE BY MOUTH DAILY WITH BREAKFAST. 05/07/22   Bedsole, Amy E, MD  vitamin B-12 (CYANOCOBALAMIN) 1000 MCG tablet Take 1,000 mcg by mouth daily.    [provider]                                                                                                                                     Allergies Hydrocodone-acetaminophen, Percocet [oxycodone-acetaminophen], and Sulfa antibiotics  Review of Systems Review of Systems As noted in HPI  Physical Exam Vital Signs  I have reviewed the triage vital signs BP (!) 166/65   Pulse 63   Temp 98.4 F (36.9 C) (Oral)   Resp (!) 23   Ht '5\' 2"'$  (1.575 m)   Wt 63.5 kg   SpO2 98%   BMI 25.61 kg/m   Physical Exam Constitutional:      General: She is not in acute distress.    Appearance: She is well-developed. She is not diaphoretic.  HENT:     Head: Normocephalic and atraumatic.     Right Ear: External ear normal.     Left Ear: External ear normal.     Nose:     Right Nostril: Epistaxis present.     Left Nostril: Epistaxis present.     Right Sinus: Maxillary sinus tenderness present.     Mouth/Throat:     Mouth: Lacerations present.     Dentition: No dental tenderness.   Eyes:     General: No scleral icterus.       Right eye: No discharge.        Left eye: No discharge.     Conjunctiva/sclera: Conjunctivae normal.     Pupils: Pupils are equal, round, and reactive to light.  Cardiovascular:     Rate and Rhythm: Normal rate and regular rhythm.  Pulses:          Radial pulses are 2+ on the right side and 2+ on the left side.       Dorsalis pedis pulses are 2+ on the right side and 2+ on the left side.     Heart sounds: Normal heart sounds. No murmur heard.    No friction rub. No gallop.  Pulmonary:     Effort: Pulmonary effort is normal. No respiratory distress.     Breath sounds: Normal breath sounds. No stridor. No wheezing.  Abdominal:     General: There is no distension.     Palpations: Abdomen is soft.     Tenderness: There is no abdominal tenderness.  Musculoskeletal:     Right hand: Swelling (baseline since surgery) and tenderness present. Decreased strength (since surgery). Normal sensation. Normal pulse.     Cervical back: Normal range of motion and neck supple. No bony tenderness.     Thoracic  back: No bony tenderness.     Lumbar back: No bony tenderness.     Comments: Clavicles stable. Chest stable to AP/Lat compression. Pelvis stable to Lat compression. No obvious extremity deformity. No chest or abdominal wall contusion.  Skin:    General: Skin is warm and dry.     Findings: No erythema or rash.  Neurological:     Mental Status: She is alert and oriented to person, place, and time.     Comments: Moving all extremities     ED Results and Treatments Labs (all labs ordered are listed, but only abnormal results are displayed) Labs Reviewed  CBC WITH DIFFERENTIAL/PLATELET - Abnormal; Notable for the following components:      Result Value   RBC 3.71 (*)    Hemoglobin 11.6 (*)    All other components within normal limits  COMPREHENSIVE METABOLIC PANEL - Abnormal; Notable for the following components:   Glucose, Bld 231 (*)    BUN 36 (*)    Creatinine, Ser 1.28 (*)    Total Protein 5.9 (*)    GFR, Estimated 42 (*)    All other components within normal limits  CBG MONITORING, ED - Abnormal; Notable for the following components:   Glucose-Capillary 203 (*)    All other components within normal limits  TROPONIN I (HIGH SENSITIVITY)  TROPONIN I (HIGH SENSITIVITY)                                                                                                                         EKG  EKG Interpretation  Date/Time:  Saturday July 29 2022 00:55:51 EDT Ventricular Rate:  70 PR Interval:  160 QRS Duration: 93 QT Interval:  413 QTC Calculation: 446 R Axis:   65 Text Interpretation: Sinus rhythm Ventricular premature complex Confirmed by Addison Lank [phone]) on 07/29/2022 12:57:53 AM       Radiology MR Cervical Spine Wo Contrast  Result Date: 07/29/2022 CLINICAL DATA:  Fall EXAM: MRI CERVICAL SPINE WITHOUT CONTRAST TECHNIQUE: Multiplanar,  multisequence MR imaging of the cervical spine was performed. No intravenous contrast was administered. COMPARISON:   07/29/2022 CT cervical spine FINDINGS: Alignment: Physiologic. Vertebrae: Acute fracture of the anterior inferior corner of C6 is better characterized on the earlier CT. There is trace bone marrow edema at this location. The anterior longitudinal ligament is intact. Cord: Normal signal and morphology. Posterior Fossa, vertebral arteries, paraspinal tissues: Thin prevertebral effusion below the C6 level. Disc levels: C1-2: Unremarkable. C2-3: Normal disc space and facet joints. There is no spinal canal stenosis. No neural foraminal stenosis. C3-4: Small left asymmetric disc bulge. There is no spinal canal stenosis. Mild left neural foraminal stenosis. C4-5: Medium-sized disc bulge with bilateral uncovertebral hypertrophy. Moderate spinal canal stenosis. Severe right neural foraminal stenosis. C5-6: Small right foraminal disc osteophyte complex. There is no spinal canal stenosis. Moderate right neural foraminal stenosis. C6-7: Small disc bulge with bilateral uncovertebral hypertrophy. There is no spinal canal stenosis. Mild right and moderate left neural foraminal stenosis. C7-T1: Normal disc space and facet joints. There is no spinal canal stenosis. No neural foraminal stenosis. IMPRESSION: 1. Acute fracture of the anterior inferior corner of C6 is better characterized on the earlier CT. The anterior longitudinal ligament is intact. 2. No spinal cord signal abnormality or epidural hematoma. Thin lower cervical/upper thoracic prevertebral effusion. 3. Moderate C4-5 spinal canal stenosis and severe right neural foraminal stenosis. 4. Moderate right C5-6 and left C6-7 neural foraminal stenosis. Electronically Signed   By: Ulyses Jarred M.D.   On: 07/29/2022 03:56   CT Head Wo Contrast  Result Date: 07/29/2022 CLINICAL DATA:  Trauma/fall EXAM: CT HEAD WITHOUT CONTRAST CT MAXILLOFACIAL WITHOUT CONTRAST CT CERVICAL SPINE WITHOUT CONTRAST TECHNIQUE: Multidetector CT imaging of the head, cervical spine, and maxillofacial  structures were performed using the standard protocol without intravenous contrast. Multiplanar CT image reconstructions of the cervical spine and maxillofacial structures were also generated. RADIATION DOSE REDUCTION: This exam was performed according to the departmental dose-optimization program which includes automated exposure control, adjustment of the mA and/or kV according to patient size and/or use of iterative reconstruction technique. COMPARISON:  CT head dated 03/10/2022 FINDINGS: CT HEAD FINDINGS Brain: No evidence of acute infarction, hemorrhage, hydrocephalus, extra-axial collection or mass lesion/mass effect. Subcortical white matter and periventricular small vessel ischemic changes. Encephalomalacic changes related to old left anterior temporal infarct. There is associated linear high density (coronal image 29) which is new from the prior but not favored to reflect acute hemorrhage. Vascular: No hyperdense vessel or unexpected calcification. Skull: Normal. Negative for fracture or focal lesion. Other: None. CT MAXILLOFACIAL FINDINGS Osseous: Bilateral nasal bone fractures (series 4/image 87). Nondisplaced fracture of the lateral wall of the right maxillary sinus (coronal image 39). Nondisplaced fracture involving the frontal process of the right zygoma. Mandible is intact. Bilateral mandibular condyles are well-seated in the TMJs. Orbits: Bilateral orbits, including the globes retroconal soft tissues, are within normal limits. Sinuses: Layering hemorrhage in the right maxillary sinus. Soft tissues: Visualized soft tissues are grossly unremarkable. CT CERVICAL SPINE FINDINGS Alignment: Normal cervical lordosis. Skull base and vertebrae: Fracture involving the anterior/inferior corner of the C6 vertebral body/osteophyte (sagittal image 30). Vertebral body heights are otherwise maintained. Soft tissues and spinal canal: No prevertebral fluid or swelling. No visible canal hematoma. Disc levels: Mild  degenerative changes, most prominent at C6-7. Spinal canal is patent. Upper chest: Visualized lung apices are clear. Other: Visualized thyroid is unremarkable. IMPRESSION: No evidence of acute intracranial abnormality. 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 Associated linear  high density within the infarct, new from the prior, but favored to not reflect acute hemorrhage. Cervical spine fracture involving the anterior/inferior corner of the C6 vertebral body/osteophyte. Right maxillofacial fractures, as above. Layering hemorrhage in the maxillary sinus. Electronically Signed   By: Julian Hy M.D.   On: 07/29/2022 02:50   CT Cervical Spine Wo Contrast  Result Date: 07/29/2022 CLINICAL DATA:  Trauma/fall EXAM: CT HEAD WITHOUT CONTRAST CT MAXILLOFACIAL WITHOUT CONTRAST CT CERVICAL SPINE WITHOUT CONTRAST TECHNIQUE: Multidetector CT imaging of the head, cervical spine, and maxillofacial structures were performed using the standard protocol without intravenous contrast. Multiplanar CT image reconstructions of the cervical spine and maxillofacial structures were also generated. RADIATION DOSE REDUCTION: This exam was performed according to the departmental dose-optimization program which includes automated exposure control, adjustment of the mA and/or kV according to patient size and/or use of iterative reconstruction technique. COMPARISON:  CT head dated 03/10/2022 FINDINGS: CT HEAD FINDINGS Brain: No evidence of acute infarction, hemorrhage, hydrocephalus, extra-axial collection or mass lesion/mass effect. Subcortical white matter and periventricular small vessel ischemic changes. Encephalomalacic changes related to old left anterior temporal infarct. There is associated linear high density (coronal image 29) which is new from the prior but not favored to reflect acute hemorrhage. Vascular: No hyperdense vessel or unexpected calcification. Skull: Normal. Negative for fracture or focal lesion. Other: None. CT  MAXILLOFACIAL FINDINGS Osseous: Bilateral nasal bone fractures (series 4/image 87). Nondisplaced fracture of the lateral wall of the right maxillary sinus (coronal image 39). Nondisplaced fracture involving the frontal process of the right zygoma. Mandible is intact. Bilateral mandibular condyles are well-seated in the TMJs. Orbits: Bilateral orbits, including the globes retroconal soft tissues, are within normal limits. Sinuses: Layering hemorrhage in the right maxillary sinus. Soft tissues: Visualized soft tissues are grossly unremarkable. CT CERVICAL SPINE FINDINGS Alignment: Normal cervical lordosis. Skull base and vertebrae: Fracture involving the anterior/inferior corner of the C6 vertebral body/osteophyte (sagittal image 30). Vertebral body heights are otherwise maintained. Soft tissues and spinal canal: No prevertebral fluid or swelling. No visible canal hematoma. Disc levels: Mild degenerative changes, most prominent at C6-7. Spinal canal is patent. Upper chest: Visualized lung apices are clear. Other: Visualized thyroid is unremarkable. IMPRESSION: No evidence of acute intracranial abnormality. 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 Associated linear high density within the infarct, new from the prior, but favored to not reflect acute hemorrhage. Cervical spine fracture involving the anterior/inferior corner of the C6 vertebral body/osteophyte. Right maxillofacial fractures, as above. Layering hemorrhage in the maxillary sinus. Electronically Signed   By: Julian Hy M.D.   On: 07/29/2022 02:50   CT Maxillofacial Wo Contrast  Result Date: 07/29/2022 CLINICAL DATA:  Trauma/fall EXAM: CT HEAD WITHOUT CONTRAST CT MAXILLOFACIAL WITHOUT CONTRAST CT CERVICAL SPINE WITHOUT CONTRAST TECHNIQUE: Multidetector CT imaging of the head, cervical spine, and maxillofacial structures were performed using the standard protocol without intravenous contrast. Multiplanar CT image reconstructions of the cervical spine  and maxillofacial structures were also generated. RADIATION DOSE REDUCTION: This exam was performed according to the departmental dose-optimization program which includes automated exposure control, adjustment of the mA and/or kV according to patient size and/or use of iterative reconstruction technique. COMPARISON:  CT head dated 03/10/2022 FINDINGS: CT HEAD FINDINGS Brain: No evidence of acute infarction, hemorrhage, hydrocephalus, extra-axial collection or mass lesion/mass effect. Subcortical white matter and periventricular small vessel ischemic changes. Encephalomalacic changes related to old left anterior temporal infarct. There is associated linear high density (coronal image 29) which is new from the prior  but not favored to reflect acute hemorrhage. Vascular: No hyperdense vessel or unexpected calcification. Skull: Normal. Negative for fracture or focal lesion. Other: None. CT MAXILLOFACIAL FINDINGS Osseous: Bilateral nasal bone fractures (series 4/image 87). Nondisplaced fracture of the lateral wall of the right maxillary sinus (coronal image 39). Nondisplaced fracture involving the frontal process of the right zygoma. Mandible is intact. Bilateral mandibular condyles are well-seated in the TMJs. Orbits: Bilateral orbits, including the globes retroconal soft tissues, are within normal limits. Sinuses: Layering hemorrhage in the right maxillary sinus. Soft tissues: Visualized soft tissues are grossly unremarkable. CT CERVICAL SPINE FINDINGS Alignment: Normal cervical lordosis. Skull base and vertebrae: Fracture involving the anterior/inferior corner of the C6 vertebral body/osteophyte (sagittal image 30). Vertebral body heights are otherwise maintained. Soft tissues and spinal canal: No prevertebral fluid or swelling. No visible canal hematoma. Disc levels: Mild degenerative changes, most prominent at C6-7. Spinal canal is patent. Upper chest: Visualized lung apices are clear. Other: Visualized thyroid is  unremarkable. IMPRESSION: No evidence of acute intracranial abnormality. 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 Associated linear high density within the infarct, new from the prior, but favored to not reflect acute hemorrhage. Cervical spine fracture involving the anterior/inferior corner of the C6 vertebral body/osteophyte. Right maxillofacial fractures, as above. Layering hemorrhage in the maxillary sinus. Electronically Signed   By: Julian Hy M.D.   On: 07/29/2022 02:50   DG Chest Port 1 View  Result Date: 07/29/2022 CLINICAL DATA:  Trauma/fall, loss of consciousness EXAM: PORTABLE CHEST 1 VIEW COMPARISON:  07/01/2013 FINDINGS: Lungs are clear.  No pleural effusion or pneumothorax. The heart is normal in size. Postsurgical changes related to prior CABG. Median sternotomy. IMPRESSION: No evidence of acute cardiopulmonary disease. Electronically Signed   By: Julian Hy M.D.   On: 07/29/2022 01:58    Medications Ordered in ED Medications  0.9 %  sodium chloride infusion (0 mLs Intravenous Stopped 07/29/22 0735)  lidocaine-EPINEPHrine (XYLOCAINE W/EPI) 2 %-1:200000 (PF) injection 20 mL (20 mLs Intradermal Given 07/29/22 0510)                                                                                                                                     Procedures .Marland KitchenLaceration Repair  Date/Time: 07/29/2022 5:04 AM  Performed by: Fatima Blank, MD Authorized by: Fatima Blank, MD   Consent:    Consent obtained:  Verbal   Consent given by:  Patient   Risks discussed:  Infection, pain, poor cosmetic result and poor wound healing   Alternatives discussed:  Delayed treatment Universal protocol:    Procedure explained and questions answered to patient or proxy's satisfaction: yes     Imaging studies available: yes     Patient identity confirmed:  Arm band Laceration details:    Location:  Lip   Lip location:  Upper interior lip   Length (cm):  1.4   Depth (mm):   6 Pre-procedure  details:    Preparation:  Patient was prepped and draped in usual sterile fashion and imaging obtained to evaluate for foreign bodies Exploration:    Hemostasis achieved with:  Direct pressure   Imaging obtained comment:  CT   Imaging outcome: foreign body not noted     Wound exploration: wound explored through full range of motion and entire depth of wound visualized     Wound extent: no fascia violation noted, no foreign bodies/material noted, no tendon damage noted, no underlying fracture noted and no vascular damage noted     Contaminated: no   Treatment:    Area cleansed with:  Saline   Amount of cleaning:  Standard   Irrigation solution:  Sterile water   Debridement:  None   Layers/structures repaired:  Muscle belly Muscle belly:    Suture size:  4-0   Suture material:  Vicryl   Suture technique:  Buried horizontal mattress   Number of sutures:  1 Skin repair:    Repair method:  Sutures   Suture size:  4-0   Wound skin closure material used: vicryl rapide.   Suture technique:  Simple interrupted   Number of sutures:  4 Approximation:    Approximation:  Close   Vermilion border well-aligned: yes   Repair type:    Repair type:  Intermediate Post-procedure details:    Dressing:  Open (no dressing)   Procedure completion:  Tolerated   (including critical care time)  Medical Decision Making / ED Course   Medical Decision Making Amount and/or Complexity of Data Reviewed Labs: ordered. Decision-making details documented in ED Course. Radiology: ordered and independent interpretation performed. Decision-making details documented in ED Course. ECG/medicine tests: ordered and independent interpretation performed. Decision-making details documented in ED Course.  Risk Prescription drug management. Decision regarding hospitalization.    Syncopal episode resulting in facial trauma.  Syncope likely related to polypharmacy from 3 sedating medication.   Given past medical history, will rule out cardiac etiology. EKG without acute ischemic changes, dysrhythmias or blocks. Serial troponins were negative x2.[name]  CBC without leukocytosis.  Stable mild anemia.  Metabolic panel without significant electrolyte derangements.  She has hyperglycemia without evidence of DKA.  She does have mild renal sufficiency without AKI.  With regards to her facial trauma, imaging was obtained and notable for C6 anterior osteophyte fracture.  She also has facial fractures to bilateral nasal and the right maxillary sinus and zygoma.  Given the mechanism and inability to obtain a proper upper extremity neuro exam given her recent surgery, MRI was obtained to rule out evidence of central cord compression.  This was negative.  It did however note evidence of mild to moderate stenosis.   Clinical Course as of 07/29/22 0757  Sat Jul 29, 2022  0546 Spoke with neurosurgery who recommended soft collar and outpatient follow-up. [PC]    Clinical Course User Index [PC] Eitan Doubleday, Grayce Sessions, MD   Laceration was closed as above.  Patient was provided with IV fluids.  On reassessment, orthostatics were reassuring patient was asymptomatic.    Final Clinical Impression(s) / ED Diagnoses Final diagnoses:  Syncope and collapse  Facial fracture due to fall, open, initial encounter [facility])  Other closed nondisplaced fracture of sixth cervical vertebra, initial encounter ([place])  Lip laceration, initial encounter  Polypharmacy   The patient appears reasonably screened and/or stabilized for discharge and I doubt any other medical condition or other [facility] requiring further screening, evaluation, or treatment in the ED at this time.  I have discussed the findings, Dx and Tx plan with the patient/family who expressed understanding and agree(s) with the plan. Discharge instructions discussed at length. The patient/family was given strict return precautions who verbalized understanding of the  instructions. No further questions at time of discharge.  Disposition: Discharge  Condition: Good  ED Discharge Orders          Ordered    amoxicillin-clavulanate (AUGMENTIN) 875-125 MG tablet  Every 12 hours        07/29/22 0703             Follow Up: Jinny Sanders, MD [address] [place] [address] [phone]  Call  to schedule an appointment for close follow up  Eustace Moore, MD [address] [place] [address] [phone]  Call  to schedule an appointment for close follow up  Raylene Miyamoto, MD [id number] [place] [address] [phone]  Call  to schedule an appointment for close follow up           This chart was dictated using voice recognition software.  Despite best efforts to proofread,  errors can occur which can change the documentation meaning.    Fatima Blank, MD 07/29/22 [phone]

## 2022-07-29 NOTE — ED Notes (Signed)
Dr Leonette Monarch @ bedside

## 2022-07-29 NOTE — ED Triage Notes (Signed)
Pt BIB EMS from home. Pt reports she was walking to the kitchen to check her BP because she didn't feel well and the next thing she remembers is waking up on the floor. Called out to son for help. Pt is not on blood thinners. Pt has dried blood to nose and right side of face and reporting upper back pain. Pt BP initially 82 systolic, pt pale and clammy.  EMS gave 500 of fluids - BP improved to 132/78 Pt is AO but lethargic - reports taking 2 "sleeping pills" tonight  HR 60 97% RA  CBG 237 20 RH  C collar on

## 2022-07-29 NOTE — ED Notes (Signed)
MD Cardama notified of Orthostatic vitals - Pt did not endorse any dizziness.  Pt face cleaned up at this time

## 2022-07-29 NOTE — Progress Notes (Signed)
Orthopedic Tech Progress Note Patient Details:  Morgan [name] Jan 15, 1941 [id number]  Ortho Devices Type of Ortho Device: Soft collar Ortho Device/Splint Location: neck Ortho Device/Splint Interventions: Ordered, Application, Adjustment   Post Interventions Patient Tolerated: Well Instructions Provided: Adjustment of device, Care of device  Arville Go 07/29/2022, 6:39 AM

## 2022-07-29 NOTE — ED Notes (Signed)
Patient verbalizes understanding of discharge instructions. Opportunity for questioning and answers were provided. Armband removed by staff, pt discharged from ED via wheelchair.  

## 2022-07-29 NOTE — ED Notes (Signed)
Pt taken to MRI - C spine maintained while pt moved to scanner table

## 2022-07-31 ENCOUNTER — Telehealth: Payer: Self-pay | Admitting: Family Medicine

## 2022-07-31 DIAGNOSIS — S0240CA Maxillary fracture, right side, initial encounter for closed fracture: Secondary | ICD-10-CM | POA: Diagnosis not present

## 2022-07-31 DIAGNOSIS — S022XXA Fracture of nasal bones, initial encounter for closed fracture: Secondary | ICD-10-CM | POA: Diagnosis not present

## 2022-07-31 NOTE — Telephone Encounter (Signed)
Patient daughter Jenny Reichmann called in and stated that she passed out and fell late Friday night and broke her nose, cheek bone, and chipped vertebrae. She will be following up with ENT today and scheduled with Dr. Diona Browner Thursday. Lovey Newcomer would like for you to give her a call when possible. She can be reached at [phone] or [phone]. Thank you!

## 2022-07-31 NOTE — Telephone Encounter (Signed)
Noted  

## 2022-07-31 NOTE — Telephone Encounter (Signed)
Spoke to patient by telephone and was advised that she wants to make sure that Dr. Diona Browner is aware that she had to go to the ER over the weekend. Patient stated that she has reached out to the Neurosurgeon's office (Dr. Ronnald Ramp) this morning and is not having any luck getting an appointment scheduled. Patient stated twice that she was on the phone with the office and they told her that they would have to call her back because they had another emergency to handle. Patient stated that she has an appointment scheduled today with the ENT.  Patient was advised that she should let the ENT know that she is having a  problem trying to get in with the Neurosurgeon. Patient stated that she is in pain and tylenol and aleve is not helping much. Patient stated that she already has scheduled an appointment with Dr. Diona Browner for Thursday. Patient was advised that we can try to move her appointment up with Dr. Diona Browner if she wants [place] to.. Patient stated that she will see how her appointment goes today with the ENT. Patient was advised that Dr. Diona Browner is out of the office today but will route this message to her.

## 2022-08-01 ENCOUNTER — Telehealth: Payer: Self-pay

## 2022-08-01 NOTE — Progress Notes (Signed)
Chronic Care Management Pharmacy Assistant   Name: Morgan [name]  MRN: [id number] DOB: 18-Jan-1941  Reason for Encounter: Non-CCM [facility] Follow-Up)   Medications: Outpatient Encounter Medications as of 08/01/2022  Medication Sig   QUEtiapine (SEROQUEL) 25 MG tablet TAKE 1 TABLET BY MOUTH EVERYDAY AT BEDTIME   Alum & Mag Hydroxide-Simeth (MAALOX REGULAR STRENGTH PO) Take by mouth daily as needed. Patient takes 1 capful daily of the powder formulation.   amoxicillin-clavulanate (AUGMENTIN) 875-125 MG tablet Take 1 tablet by mouth every 12 (twelve) hours.   aspirin EC 81 MG tablet Take 1 tablet (81 mg total) by mouth daily.   atorvastatin (LIPITOR) 80 MG tablet TAKE 1 TABLET BY MOUTH EVERY DAY   BLINK TEARS 0.25 % SOLN Place 1 drop into both eyes daily as needed (dry/irritated eyes.).   calcium-vitamin D (OSCAL WITH D) 500-200 MG-UNIT tablet Take 1 tablet by mouth 2 (two) times daily.   chlorthalidone (HYGROTON) 25 MG tablet TAKE 1 TABLET BY MOUTH EVERY DAY   CINNAMON PO Take 1,000 mg by mouth in the morning and at bedtime.   diphenhydrAMINE HCl ([employer] ALLERGY PO) Take 1 tablet by mouth daily.   ferrous sulfate 325 (65 FE) MG tablet Take 325 mg by mouth daily.   gabapentin (NEURONTIN) 300 MG capsule TAKE 2 CAPSULES BY MOUTH AT BEDTIME   Lancet Device MISC Use to check blood sugar twice daily.   Lancets (FREESTYLE) lancets Use to check blood sugar twice daily   metFORMIN (GLUCOPHAGE-XR) 500 MG 24 hr tablet TAKE 2 TABLETS BY MOUTH TWICE A DAY   methylcellulose (CITRUCEL) oral powder Use as directed daily   metoprolol succinate (TOPROL-XL) 100 MG 24 hr tablet TAKE 1 TABLET BY MOUTH DAILY. TAKE WITH OR IMMEDIATELY FOLLOWING A MEAL.   Multiple Vitamins-Minerals (HAIR SKIN AND NAILS FORMULA PO) Take 1 tablet by mouth daily.   Multiple Vitamins-Minerals (PRESERVISION AREDS PO) Take 1 tablet by mouth 2 (two) times daily. Reported on 12/23/2015   nitroGLYCERIN (NITROSTAT) 0.4 MG SL tablet PLACE 1  TABLET (0.4 MG TOTAL) UNDER THE TONGUE EVERY 5 (FIVE) MINUTES AS NEEDED FOR CHEST PAIN   omeprazole (PRILOSEC OTC) 20 MG tablet Take 20 mg by mouth daily.   ONETOUCH ULTRA test strip USE TO CHECK BLOOD SUGAR TWICE DAILY   Probiotic Product (ALIGN PO) Take by mouth.   rOPINIRole (REQUIP) 1 MG tablet 1/2 tablet during the day, 2 tablets at bedtime   tolterodine (DETROL LA) 2 MG 24 hr capsule Take 1 capsule (2 mg total) by mouth daily.   venlafaxine XR (EFFEXOR-XR) 75 MG 24 hr capsule TAKE 1 CAPSULE BY MOUTH DAILY WITH BREAKFAST.   vitamin B-12 (CYANOCOBALAMIN) 1000 MCG tablet Take 1,000 mcg by mouth daily.   No facility-administered encounter medications on file as of 08/01/2022.    Reviewed hospital notes for details of recent visit. Patient has been contacted by Transitions of Care team: No  Admitted to the ED on 07/29/2022. Discharge date was 07/29/2022.  Discharged from [facility].   Discharge diagnosis (Principal Problem): Syncope and Collape and Facial fracture due to fall Patient was discharged to Home  Brief summary of hospital course: Morgan [name] is a 81 y.o. 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 This occurred around 1030-11 PM.  Patient reports that she took her nighttime  medicine which included Requip, Seroquel, and tramadol around 9 PM.  Reports that she was sitting down eating dinner around 1030p.  When she stood to walk to the kitchen she felt generally fatigued and drained.  While walking to the kitchen, this worsened.  She was putting the plate into the dishwasher when she passed out.  She remembers waking up on the floor in a pool of blood. 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  States that she ate breakfast but did not eat anything else throughout the day.  She did state that she was drinking some water. Patient is endorsing moderate facial pain and mouth pain.  She is reporting upper back and neck pain.  She also has pain in the right hand that has been there since her carpal tunnel surgery.   Upon EMS arrival patient was noted to be hypotensive which improved with IV fluids in route. Syncopal episode resulting in facial trauma. Syncope likely related to polypharmacy from 3 sedating medication.  Given past medical history, will rule out cardiac etiology. EKG without acute ischemic changes, dysrhythmias or blocks. Serial troponins were negative x2. CBC without leukocytosis.  Stable mild anemia.  Metabolic panel without significant electrolyte derangements. She has hyperglycemia without evidence of DKA.  She does have mild renal sufficiency without AKI. With regards to her facial trauma, imaging was obtained and notable for C6 anterior osteophyte fracture.  She also has facial fractures to bilateral nasal and the right maxillary sinus and zygoma. Given the mechanism and inability to obtain a proper upper extremity neuro exam given her recent surgery, MRI was obtained to rule out evidence of central cord compression.  This was negative.  It did however note evidence of mild to moderate stenosis. The patient appears reasonably screened and/or stabilized for discharge and I doubt any other medical condition or other [facility] requiring further screening, evaluation, or treatment in the ED at this time. I have discussed the findings, Dx and Tx plan with the patient/family who expressed understanding and agree(s) with the plan. Discharge instructions discussed at length. The patient/family was given strict return precautions who verbalized understanding of the instructions. No further questions at time of discharge.  New?Medications Started at [facility] Discharge:?? -Started  amoxicillin-clavulanate (AUGMENTIN) 875-125 MG tablet   Medications that remain the same after Hospital Discharge:??  -All other medications will remain the same.    Next CCM appt: Non-CCM  Other upcoming appts: PCP appointment on 08/03/2022 for hospital FU PCP appointment on 08/22/2022  Charlene Brooke, PharmD notified and will determine if action is needed.  Charlene Brooke, CPP notified  Marijean Niemann, [place] Clinical Pharmacy Assistant [phone]

## 2022-08-03 ENCOUNTER — Ambulatory Visit: Payer: Medicare PPO | Admitting: Family Medicine

## 2022-08-03 VITALS — BP 156/78 | HR 67 | Temp 96.5°F | Resp 14 | Ht 62.0 in | Wt 133.0 lb

## 2022-08-03 DIAGNOSIS — S12501D Unspecified nondisplaced fracture of sixth cervical vertebra, subsequent encounter for fracture with routine healing: Secondary | ICD-10-CM | POA: Diagnosis not present

## 2022-08-03 DIAGNOSIS — N289 Disorder of kidney and ureter, unspecified: Secondary | ICD-10-CM

## 2022-08-03 DIAGNOSIS — R55 Syncope and collapse: Secondary | ICD-10-CM | POA: Diagnosis not present

## 2022-08-03 DIAGNOSIS — S0292XD Unspecified fracture of facial bones, subsequent encounter for fracture with routine healing: Secondary | ICD-10-CM

## 2022-08-03 LAB — BASIC METABOLIC PANEL
BUN: 33 mg/dL — ABNORMAL HIGH (ref 6–23)
CO2: 32 mEq/L (ref 19–32)
Calcium: 9.4 mg/dL (ref 8.4–10.5)
Chloride: 100 mEq/L (ref 96–112)
Creatinine, Ser: 0.93 mg/dL (ref 0.40–1.20)
GFR: 58.11 mL/min — ABNORMAL LOW (ref >60.00)
Glucose, Bld: 150 mg/dL — ABNORMAL HIGH (ref 70–99)
Potassium: 4.2 mEq/L (ref 3.5–5.1)
Sodium: 142 mEq/L (ref 135–145)

## 2022-08-03 MED ORDER — ROPINIROLE HCL 1 MG PO TABS: ORAL_TABLET | ORAL | 11 refills | Status: DC

## 2022-08-03 NOTE — Patient Instructions (Addendum)
Check blood sugars daily... call with  fasting measurement in 1 week.  Stop benadryl, stop tolterodine..  Can try one Capsule of gabapentin daily to try reduce sedating medication.  Please stop at the lab to have labs drawn.   If labs are normal.. to treat pain with tylenol twice daily, for severe pain... on full stomach,  during the day could se tramadol 1/2 tablet.

## 2022-08-03 NOTE — Progress Notes (Signed)
Patient ID: Morgan [name], female    DOB: 1940-12-31, 81 y.o.   MRN: [id number]  This visit was conducted in person.  BP (!) 156/78   Pulse 67   Temp (!) 96.5 F (35.8 C) (Temporal)   Resp 14   Ht '5\' 2"'$  (1.575 m)   Wt 133 lb (60.3 kg)   SpO2 99%   BMI 24.33 kg/m    CC:  Chief Complaint  Patient presents with   Hospitalization Follow-up    Also needs Detrol refilled/updated- she has been taking 2 mg 2 capsules daily instead of 1 capsule.    Subjective:   HPI: Morgan [name] is a 81 y.o. female presenting on 08/03/2022 for Hospitalization Follow-up (Also needs Detrol refilled/updated- she has been taking 2 mg 2 capsules daily instead of 1 capsule.)  Hospitalized on July 29, 2022 following syncope resulting in fall with facial fracture lip laceration and injury to sixth cervical vertebrae.  Syncope occurred after taking Requip, Seroquel and ( additional not ususal), had for new left hand pain) tramadol...  had not eaten much that day.Polypharmacy felt to be main contributor to syncope... resulted in hypotension.   EKG without acute ischemic changes, dysrhythmias or blocks. Serial troponins were negative x2.[name]  CBC without leukocytosis.  Stable mild anemia.  Metabolic panel without significant electrolyte derangements.  She has hyperglycemia without evidence of DKA.  She does have mild renal sufficiency without AKI. Imaging was obtained and notable for C6 anterior osteophyte fracture.  She also has facial fractures to bilateral nasal and the right maxillary sinus and zygoma.   MRI cervical spine  Negative for central cord compression.  She was provided with follow-up with neurosurgery and ENT.   ENT 08/01/2022...  Dr. Benjamine Mola.... recommended time for healing. Facial pain not severe.  On Augmentin preventatively.    Has pending appt 10/3 Dr.  Ronnald Ramp neurosurgery.  She has been having severe neck pain... no given any pain medication. 5/10 on pain scale Lip laceration  sutured.. sore now.  Using boost for nutrition. Eating is hard since mouth soreness. Wt Readings from Last 3 Encounters:  08/03/22 133 lb (60.3 kg)  07/29/22 140 lb (63.5 kg)  05/19/22 139 lb 5 oz (63.2 kg)    BP running 140-79 at home on chlorthalidone, metoprolol 100 mg daily.  Using aleve for pain  Relevant past medical, surgical, family and social history reviewed and updated as indicated. Interim medical history since our last visit reviewed. Allergies and medications reviewed and updated. Outpatient Medications Prior to Visit  Medication Sig Dispense Refill   Alum & Mag Hydroxide-Simeth (MAALOX REGULAR STRENGTH PO) Take by mouth daily as needed. Patient takes 1 capful daily of the powder formulation.     amoxicillin-clavulanate (AUGMENTIN) 875-125 MG tablet Take 1 tablet by mouth every 12 (twelve) hours. 14 tablet 0   aspirin EC 81 MG tablet Take 1 tablet (81 mg total) by mouth daily.     atorvastatin (LIPITOR) 80 MG tablet TAKE 1 TABLET BY MOUTH EVERY DAY 90 tablet 3   BLINK TEARS 0.25 % SOLN Place 1 drop into both eyes daily as needed (dry/irritated eyes.).     calcium-vitamin D (OSCAL WITH D) 500-200 MG-UNIT tablet Take 1 tablet by mouth 2 (two) times daily.     chlorthalidone (HYGROTON) 25 MG tablet TAKE 1 TABLET BY MOUTH EVERY DAY 90 tablet 3   CINNAMON PO Take 1,000 mg by mouth in the morning and at bedtime.     diphenhydrAMINE HCl (  [facility] ALLERGY PO) Take 1 tablet by mouth daily.     ferrous sulfate 325 (65 FE) MG tablet Take 325 mg by mouth daily.     gabapentin (NEURONTIN) 300 MG capsule TAKE 2 CAPSULES BY MOUTH AT BEDTIME 180 capsule 1   Lancet Device MISC Use to check blood sugar twice daily. 1 each 0   Lancets (FREESTYLE) lancets Use to check blood sugar twice daily 100 each 11   metFORMIN (GLUCOPHAGE-XR) 500 MG 24 hr tablet TAKE 2 TABLETS BY MOUTH TWICE A DAY 360 tablet 3   methylcellulose (CITRUCEL) oral powder Use as directed daily     metoprolol succinate (TOPROL-XL)  100 MG 24 hr tablet TAKE 1 TABLET BY MOUTH DAILY. TAKE WITH OR IMMEDIATELY FOLLOWING A MEAL. 90 tablet 3   Multiple Vitamins-Minerals (PRESERVISION AREDS PO) Take 1 tablet by mouth 2 (two) times daily. Reported on 12/23/2015     nitroGLYCERIN (NITROSTAT) 0.4 MG SL tablet PLACE 1 TABLET (0.4 MG TOTAL) UNDER THE TONGUE EVERY 5 (FIVE) MINUTES AS NEEDED FOR CHEST PAIN 25 tablet 0   ONETOUCH ULTRA test strip USE TO CHECK BLOOD SUGAR TWICE DAILY 150 strip 3   Probiotic Product (ALIGN PO) Take by mouth.     QUEtiapine (SEROQUEL) 25 MG tablet TAKE 1 TABLET BY MOUTH EVERYDAY AT BEDTIME 90 tablet 1   rOPINIRole (REQUIP) 1 MG tablet 1/2 tablet during the day, 2 tablets at bedtime 90 tablet 11   tolterodine (DETROL LA) 2 MG 24 hr capsule Take 1 capsule (2 mg total) by mouth daily. 30 capsule 3   venlafaxine XR (EFFEXOR-XR) 75 MG 24 hr capsule TAKE 1 CAPSULE BY MOUTH DAILY WITH BREAKFAST. 90 capsule 0   vitamin B-12 (CYANOCOBALAMIN) 1000 MCG tablet Take 1,000 mcg by mouth daily.     Multiple Vitamins-Minerals (HAIR SKIN AND NAILS FORMULA PO) Take 1 tablet by mouth daily.     omeprazole (PRILOSEC OTC) 20 MG tablet Take 20 mg by mouth daily.     No facility-administered medications prior to visit.     Per HPI unless specifically indicated in ROS section below Review of Systems  Constitutional:  Negative for fatigue and fever.  HENT:  Negative for congestion.   Eyes:  Negative for pain.  Respiratory:  Negative for cough and shortness of breath.   Cardiovascular:  Negative for chest pain, palpitations and leg swelling.  Gastrointestinal:  Negative for abdominal pain.  Genitourinary:  Negative for dysuria and vaginal bleeding.  Musculoskeletal:  Positive for neck pain and neck stiffness. Negative for back pain.  Skin:  Positive for wound.  Neurological:  Negative for syncope, light-headedness and headaches.  Psychiatric/Behavioral:  Negative for dysphoric mood.    Objective:  BP (!) 156/78   Pulse 67    Temp (!) 96.5 F (35.8 C) (Temporal)   Resp 14   Ht '5\' 2"'$  (1.575 m)   Wt 133 lb (60.3 kg)   SpO2 99%   BMI 24.33 kg/m   Wt Readings from Last 3 Encounters:  08/03/22 133 lb (60.3 kg)  07/29/22 140 lb (63.5 kg)  05/19/22 139 lb 5 oz (63.2 kg)      Physical Exam Constitutional:      General: She is not in acute distress.    Appearance: Normal appearance. She is well-developed. She is not ill-appearing or toxic-appearing.  HENT:     Head: Normocephalic. Contusion present.     Comments: Facial bruising and swelling    Right Ear: Hearing, tympanic membrane, ear  canal and external ear normal. Tympanic membrane is not erythematous, retracted or bulging.     Left Ear: Hearing, tympanic membrane, ear canal and external ear normal. Tympanic membrane is not erythematous, retracted or bulging.     Nose: No mucosal edema or rhinorrhea.     Right Sinus: No maxillary sinus tenderness or frontal sinus tenderness.     Left Sinus: No maxillary sinus tenderness or frontal sinus tenderness.     Mouth/Throat:     Pharynx: Uvula midline.  Eyes:     General: Lids are normal. Lids are everted, no foreign bodies appreciated.     Conjunctiva/sclera: Conjunctivae normal.     Pupils: Pupils are equal, round, and reactive to light.  Neck:     Thyroid: No thyroid mass or thyromegaly.     Vascular: No carotid bruit.     Trachea: Trachea normal.  Cardiovascular:     Rate and Rhythm: Normal rate and regular rhythm.     Pulses: Normal pulses.     Heart sounds: Normal heart sounds, S1 normal and S2 normal. No murmur heard.    No friction rub. No gallop.  Pulmonary:     Effort: Pulmonary effort is normal. No tachypnea or respiratory distress.     Breath sounds: Normal breath sounds. No decreased breath sounds, wheezing, rhonchi or rales.  Abdominal:     General: Bowel sounds are normal.     Palpations: Abdomen is soft.     Tenderness: There is no abdominal tenderness.  Musculoskeletal:     Cervical  back: Neck supple. Tenderness and bony tenderness present. Pain with movement present. Decreased range of motion.  Skin:    General: Skin is warm and dry.     Findings: No rash.  Neurological:     Mental Status: She is alert.     Cranial Nerves: Cranial nerves 2-12 are intact.     Sensory: Sensation is intact.     Motor: Motor function is intact.     Coordination: Coordination is intact.     Comments: Wearing cervical collar  Psychiatric:        Mood and Affect: Mood is not anxious or depressed.        Speech: Speech normal.        Behavior: Behavior normal. Behavior is cooperative.        Thought Content: Thought content normal.        Judgment: Judgment normal.       Results for orders placed or performed during the hospital encounter of 07/29/22  CBC WITH DIFFERENTIAL  Result Value Ref Range   WBC 8.8 4.0 - 10.5 K/uL   RBC 3.71 (L) 3.87 - 5.11 MIL/uL   Hemoglobin 11.6 (L) 12.0 - 15.0 g/dL   HCT 36.0 36.0 - 46.0 %   MCV 97.0 80.0 - 100.0 fL   MCH 31.3 26.0 - 34.0 pg   MCHC 32.2 30.0 - 36.0 g/dL   RDW 12.0 11.5 - 15.5 %   Platelets 254 150 - 400 K/uL   nRBC 0.0 0.0 - 0.2 %   Neutrophils Relative % 78 %   Neutro Abs 6.9 1.7 - 7.7 K/uL   Lymphocytes Relative 12 %   Lymphs Abs 1.1 0.7 - 4.0 K/uL   Monocytes Relative 8 %   Monocytes Absolute 0.7 0.1 - 1.0 K/uL   Eosinophils Relative 2 %   Eosinophils Absolute 0.1 0.0 - 0.5 K/uL   Basophils Relative 0 %   Basophils Absolute 0.0 0.0 -  0.1 K/uL   Immature Granulocytes 0 %   Abs Immature Granulocytes 0.03 0.00 - 0.07 K/uL  Comprehensive metabolic panel  Result Value Ref Range   Sodium 141 135 - 145 mmol/L   Potassium 3.7 3.5 - 5.1 mmol/L   Chloride 105 98 - 111 mmol/L   CO2 23 22 - 32 mmol/L   Glucose, Bld 231 (H) 70 - 99 mg/dL   BUN 36 (H) 8 - 23 mg/dL   Creatinine, Ser 1.28 (H) 0.44 - 1.00 mg/dL   Calcium 9.0 8.9 - 10.3 mg/dL   Total Protein 5.9 (L) 6.5 - 8.1 g/dL   Albumin 3.7 3.5 - 5.0 g/dL   AST 29 15 - 41 U/L    ALT 28 0 - 44 U/L   Alkaline Phosphatase 67 38 - 126 U/L   Total Bilirubin 0.7 0.3 - 1.2 mg/dL   GFR, Estimated 42 (L) >60 mL/min   Anion gap 13 5 - 15  CBG monitoring, ED  Result Value Ref Range   Glucose-Capillary 203 (H) 70 - 99 mg/dL  Troponin I (High Sensitivity)  Result Value Ref Range   Troponin I (High Sensitivity) 7 <18 ng/L  Troponin I (High Sensitivity)  Result Value Ref Range   Troponin I (High Sensitivity) 9 <18 ng/L     COVID 19 screen:  No recent travel or known exposure to COVID19 The patient denies respiratory symptoms of COVID 19 at this time. The importance of social distancing was discussed today.   Assessment and Plan    Problem List Items Addressed This Visit     Acute renal insufficiency    Acute, likely due to dehydration Reevaluate with labs today.      Relevant Orders   Basic Metabolic Panel (Completed)   Closed fracture of facial bone with routine healing    Acute, followed by ear nose and throat MD.  Awaiting routine healing. If labs are normal.. to treat pain with tylenol twice daily, for severe pain... on full stomach,  during the day could se tramadol 1/2 tablet.      Closed nondisplaced fracture of sixth cervical vertebra with routine healing    Acute causing significant pain Upcoming appointment with neurosurgeon. Pain control with Tylenol and only very low-dose of tramadol.      Syncope and collapse - Primary    Acute, likely related to polypharmacy (tramadol always new medication with wrist procedure),  Dehydration, and poor p.o. intake. ER work-up reviewed, no clear additional secondary causes.  We discussed the need to reduce over sedating medications given her age and increased risk. She will stop benadryl, stop tolterodine.  Encouraged her to decrease gabapentin as tolerated.      Meds ordered this encounter  Medications   rOPINIRole (REQUIP) 1 MG tablet    Sig: 1/2 tablet during the day, 2 tablets at bedtime     Dispense:  90 tablet    Refill:  11   Orders Placed This Encounter  Procedures   Basic Metabolic Panel     Eliezer Lofts, MD

## 2022-08-05 ENCOUNTER — Other Ambulatory Visit: Payer: Self-pay

## 2022-08-05 ENCOUNTER — Encounter (HOSPITAL_COMMUNITY): Payer: Self-pay | Admitting: Emergency Medicine

## 2022-08-05 ENCOUNTER — Emergency Department (HOSPITAL_COMMUNITY): Payer: Medicare PPO

## 2022-08-05 ENCOUNTER — Emergency Department (HOSPITAL_COMMUNITY)
Admission: EM | Admit: 2022-08-05 | Discharge: 2022-08-05 | Disposition: A | Discharge status: Home / Self Care | Payer: Medicare PPO | Attending: Emergency Medicine | Admitting: Emergency Medicine

## 2022-08-05 DIAGNOSIS — R079 Chest pain, unspecified: Secondary | ICD-10-CM | POA: Diagnosis not present

## 2022-08-05 DIAGNOSIS — Z794 Long term (current) use of insulin: Secondary | ICD-10-CM | POA: Diagnosis not present

## 2022-08-05 DIAGNOSIS — E119 Type 2 diabetes mellitus without complications: Secondary | ICD-10-CM | POA: Diagnosis not present

## 2022-08-05 DIAGNOSIS — Z7982 Long term (current) use of aspirin: Secondary | ICD-10-CM | POA: Diagnosis not present

## 2022-08-05 DIAGNOSIS — Z955 Presence of coronary angioplasty implant and graft: Secondary | ICD-10-CM | POA: Diagnosis not present

## 2022-08-05 DIAGNOSIS — Z951 Presence of aortocoronary bypass graft: Secondary | ICD-10-CM | POA: Insufficient documentation

## 2022-08-05 DIAGNOSIS — G4489 Other headache syndrome: Secondary | ICD-10-CM | POA: Diagnosis not present

## 2022-08-05 DIAGNOSIS — R0902 Hypoxemia: Secondary | ICD-10-CM | POA: Diagnosis not present

## 2022-08-05 DIAGNOSIS — I2510 Atherosclerotic heart disease of native coronary artery without angina pectoris: Secondary | ICD-10-CM | POA: Insufficient documentation

## 2022-08-05 DIAGNOSIS — Z7984 Long term (current) use of oral hypoglycemic drugs: Secondary | ICD-10-CM | POA: Insufficient documentation

## 2022-08-05 DIAGNOSIS — R0789 Other chest pain: Secondary | ICD-10-CM | POA: Diagnosis not present

## 2022-08-05 DIAGNOSIS — I700 Atherosclerosis of aorta: Secondary | ICD-10-CM | POA: Diagnosis not present

## 2022-08-05 DIAGNOSIS — Z79899 Other long term (current) drug therapy: Secondary | ICD-10-CM | POA: Insufficient documentation

## 2022-08-05 DIAGNOSIS — I10 Essential (primary) hypertension: Secondary | ICD-10-CM | POA: Insufficient documentation

## 2022-08-05 LAB — CBC
HCT: 33.9 % — ABNORMAL LOW (ref 36.0–46.0)
Hemoglobin: 11.2 g/dL — ABNORMAL LOW (ref 12.0–15.0)
MCH: 31.7 pg (ref 26.0–34.0)
MCHC: 33 g/dL (ref 30.0–36.0)
MCV: 96 fL (ref 80.0–100.0)
Platelets: 307 10*3/uL (ref 150–400)
RBC: 3.53 MIL/uL — ABNORMAL LOW (ref 3.87–5.11)
RDW: 11.8 % (ref 11.5–15.5)
WBC: 7.7 10*3/uL (ref 4.0–10.5)
nRBC: 0 % (ref 0.0–0.2)

## 2022-08-05 LAB — BASIC METABOLIC PANEL
Anion gap: 10 (ref 5–15)
BUN: 26 mg/dL — ABNORMAL HIGH (ref 8–23)
CO2: 27 mmol/L (ref 22–32)
Calcium: 8.8 mg/dL — ABNORMAL LOW (ref 8.9–10.3)
Chloride: 103 mmol/L (ref 98–111)
Creatinine, Ser: 0.95 mg/dL (ref 0.44–1.00)
GFR, Estimated: >60 mL/min (ref >60)
Glucose, Bld: 99 mg/dL (ref 70–99)
Potassium: 3.8 mmol/L (ref 3.5–5.1)
Sodium: 140 mmol/L (ref 135–145)

## 2022-08-05 LAB — TROPONIN I (HIGH SENSITIVITY)
Troponin I (High Sensitivity): 6 ng/L (ref <18)
Troponin I (High Sensitivity): 5 ng/L (ref <18)

## 2022-08-05 NOTE — ED Triage Notes (Signed)
Pt arrives via EMS for cp that started this morning while she was laying in bed. Pain radiates to left shoulder and increases with movement. Pt took 1 nitro at home with no relief. EMS gave '324mg'$  ASA and another nitro. No relief from this either. HR 70's. BP initially 180/80 and decreased to 122/86 after nitro  Pt had a recent fall with facial fx, nose fx and spinal fx.

## 2022-08-05 NOTE — Discharge Instructions (Signed)
You were seen today for chest pain. Your workup was reassuring for no signs of acute coronary syndrome. Your pain may be musculoskeletal in nature. Please take over the counter medications as needed as approved by your primary care provider. You may follow up with your cardiologist as needed to discuss your chest pain. Return to the emergency department if you develop any life threatening conditions.

## 2022-08-05 NOTE — ED Provider Notes (Signed)
[facility] EMERGENCY DEPARTMENT Provider Note   CSN: [id number] Arrival date & time: 08/05/22  0805     History  Chief Complaint  Patient presents with   Chest Pain    Morgan [name] is a 81 y.o. female.  Patient presents to the hospital via EMS complaining of chest pain.  Patient states that she got out of bed at 4 AM this morning and went to get a drink of water, and then walked to sit in a chair.  She states that upon sitting in the chair she felt a heaviness in her chest that she rated at 7 out of 10 in severity, with radiation to the left arm.  She states that she took a dose of nitroglycerin with no relief.  The patient does state that the nitroglycerin may be well beyond expiration.  EMS administered the patient 324 mg of aspirin and another dose of nitroglycerin.  There is no relief with this nitroglycerin.  Blood pressure dropped approximately 60 points systolically after the nitroglycerin administration.  The patient currently rates her chest pain as a 5 out of 10 in severity.  She denies nausea, vomiting, shortness of breath, abdominal pain, headache, urinary symptoms.  The patient was recently seen in the emergency department on September 16 due to a fall and was diagnosed with an acute C6 fracture of the anterior inferior corner, right-sided maxillofacial fractures.  Past medical history significant for CABG in 2005, coronary angioplasty with stent in 2012, coronary artery disease, hypertension, hyperlipidemia, diabetes mellitus, atrial fibrillation, GERD, arthritis, angina, right-sided body weakness, fibromyalgia, history of stroke HPI     Home Medications Prior to Admission medications   Medication Sig Start Date End Date Taking? Authorizing Provider  Alum & Mag Hydroxide-Simeth (MAALOX REGULAR STRENGTH PO) Take by mouth daily as needed. Patient takes 1 capful daily of the powder formulation.    [provider]  amoxicillin-clavulanate (AUGMENTIN)  875-125 MG tablet Take 1 tablet by mouth every 12 (twelve) hours. 07/29/22   Fatima Blank, MD  aspirin EC 81 MG tablet Take 1 tablet (81 mg total) by mouth daily. 10/01/18   Jerline Pain, MD  atorvastatin (LIPITOR) 80 MG tablet TAKE 1 TABLET BY MOUTH EVERY DAY 08/23/21   Jerline Pain, MD  BLINK TEARS 0.25 % SOLN Place 1 drop into both eyes daily as needed (dry/irritated eyes.).    [provider]  calcium-vitamin D (OSCAL WITH D) 500-200 MG-UNIT tablet Take 1 tablet by mouth 2 (two) times daily.    [provider]  chlorthalidone (HYGROTON) 25 MG tablet TAKE 1 TABLET BY MOUTH EVERY DAY 03/19/22   Bedsole, Amy E, MD  CINNAMON PO Take 1,000 mg by mouth in the morning and at bedtime.    [provider]  ferrous sulfate 325 (65 FE) MG tablet Take 325 mg by mouth daily.    [provider]  gabapentin (NEURONTIN) 300 MG capsule TAKE 2 CAPSULES BY MOUTH AT BEDTIME 07/04/22   Bedsole, Amy E, MD  Lancet Device MISC Use to check blood sugar twice daily. 10/16/18   Bedsole, Amy E, MD  Lancets (FREESTYLE) lancets Use to check blood sugar twice daily 10/16/18   Bedsole, Amy E, MD  metFORMIN (GLUCOPHAGE-XR) 500 MG 24 hr tablet TAKE 2 TABLETS BY MOUTH TWICE A DAY 03/19/22   Bedsole, Amy E, MD  methylcellulose (CITRUCEL) oral powder Use as directed daily 03/31/21   Armbruster, Carlota Raspberry, MD  metoprolol succinate (TOPROL-XL) 100 MG  24 hr tablet TAKE 1 TABLET BY MOUTH DAILY. TAKE WITH OR IMMEDIATELY FOLLOWING A MEAL. 02/24/22   Bedsole, Amy E, MD  Multiple Vitamins-Minerals (PRESERVISION AREDS PO) Take 1 tablet by mouth 2 (two) times daily. Reported on 12/23/2015    [provider]  nitroGLYCERIN (NITROSTAT) 0.4 MG SL tablet PLACE 1 TABLET (0.4 MG TOTAL) UNDER THE TONGUE EVERY 5 (FIVE) MINUTES AS NEEDED FOR CHEST PAIN 08/10/20   Jinny Sanders, MD  ONETOUCH ULTRA test strip USE TO CHECK BLOOD SUGAR TWICE DAILY 08/13/19   Diona Browner, Amy E, MD  Probiotic Product (ALIGN PO) Take  by mouth.    [provider]  QUEtiapine (SEROQUEL) 25 MG tablet TAKE 1 TABLET BY MOUTH EVERYDAY AT BEDTIME 06/05/22   Bedsole, Amy E, MD  rOPINIRole (REQUIP) 1 MG tablet 1/2 tablet during the day, 2 tablets at bedtime 08/03/22   Bedsole, Amy E, MD  venlafaxine XR (EFFEXOR-XR) 75 MG 24 hr capsule TAKE 1 CAPSULE BY MOUTH DAILY WITH BREAKFAST. 05/07/22   Bedsole, Amy E, MD  vitamin B-12 (CYANOCOBALAMIN) 1000 MCG tablet Take 1,000 mcg by mouth daily.    [provider]      Allergies    Hydrocodone-acetaminophen, Percocet [oxycodone-acetaminophen], and Sulfa antibiotics    Review of Systems   Review of Systems  Respiratory:  Negative for shortness of breath.   Cardiovascular:  Positive for chest pain. Negative for leg swelling.  Gastrointestinal:  Negative for abdominal pain, constipation, diarrhea, nausea and vomiting.  Musculoskeletal:        Left sided shoulder pain    Physical Exam Updated Vital Signs BP (!) 146/69   Pulse (!) 57   Temp 97.9 F (36.6 C) (Oral)   Resp 11   SpO2 99%  Physical Exam Vitals and nursing note reviewed.  Constitutional:      General: She is not in acute distress.    Appearance: She is well-developed.  HENT:     Head: Normocephalic and atraumatic.  Eyes:     Conjunctiva/sclera: Conjunctivae normal.  Cardiovascular:     Rate and Rhythm: Normal rate and regular rhythm.     Heart sounds: No murmur heard. Pulmonary:     Effort: Pulmonary effort is normal. No respiratory distress.     Breath sounds: Normal breath sounds.  Chest:     Chest wall: No tenderness.  Abdominal:     Palpations: Abdomen is soft.     Tenderness: There is no abdominal tenderness.  Musculoskeletal:        General: No swelling.     Cervical back: Neck supple.     Right lower leg: No edema.     Left lower leg: No edema.  Skin:    General: Skin is warm and dry.     Capillary Refill: Capillary refill takes less than 2 seconds.  Neurological:     Mental  Status: She is alert.  Psychiatric:        Mood and Affect: Mood normal.     ED Results / Procedures / Treatments   Labs (all labs ordered are listed, but only abnormal results are displayed) Labs Reviewed  BASIC METABOLIC PANEL - Abnormal; Notable for the following components:      Result Value   BUN 26 (*)    Calcium 8.8 (*)    All other components within normal limits  CBC - Abnormal; Notable for the following components:   RBC 3.53 (*)    Hemoglobin 11.2 (*)  HCT 33.9 (*)    All other components within normal limits  TROPONIN I (HIGH SENSITIVITY)  TROPONIN I (HIGH SENSITIVITY)    EKG EKG Interpretation  Date/Time:  Saturday August 05 2022 08:31:05 EDT Ventricular Rate:  56 PR Interval:  156 QRS Duration: 91 QT Interval:  443 QTC Calculation: 428 R Axis:   61 Text Interpretation: Sinus rhythm Nonspecific T abnrm, anterolateral leads No significant change since last tracing Confirmed by Blanchie Dessert [phone]) on 08/05/2022 8:48:19 AM  Radiology DG Chest 2 View  Result Date: 08/05/2022 CLINICAL DATA:  81 year old female with history of chest pain. EXAM: CHEST - 2 VIEW COMPARISON:  Chest x-ray 07/29/2022. FINDINGS: Lung volumes are low. No consolidative airspace disease. No pleural effusions. No pneumothorax. No pulmonary nodule or mass noted. Pulmonary vasculature and the cardiomediastinal silhouette are within normal limits. Atherosclerotic calcifications in the thoracic aorta. Status post median sternotomy for CABG. IMPRESSION: 1. Low lung volumes without radiographic evidence of acute cardiopulmonary disease. 2. Aortic atherosclerosis. Electronically Signed   By: Vinnie Langton M.D.   On: 08/05/2022 09:30    Procedures Procedures    Medications Ordered in ED Medications - No data to display  ED Course/ Medical Decision Making/ A&P                           Medical Decision Making Amount and/or Complexity of Data Reviewed Labs: ordered. Radiology:  ordered.   This patient presents to the ED for concern of chest pain, this involves an extensive number of treatment options, and is a complaint that carries with it a high risk of complications and morbidity.  The differential diagnosis includes ACS, musculoskeletal pain, PE, dissection, pneumonia, others   Co morbidities that complicate the patient evaluation  Coronary artery disease, previous CABG   Additional history obtained:  Additional history obtained from family at bedside External records from outside source obtained and reviewed including ED notes from 7/16 due to fall and follow-up note from September 21 from family medicine due to the syncope and collapse   Lab Tests:  I Ordered, and personally interpreted labs.  The pertinent results include: Unremarkable CBC, unremarkable BMP, initial troponin 6, repeat troponin 5   Imaging Studies ordered:  I ordered imaging studies including chest x-ray I independently visualized and interpreted imaging which showed  1. Low lung volumes without radiographic evidence of acute  cardiopulmonary disease.  2. Aortic atherosclerosis.   I agree with the radiologist interpretation   Cardiac Monitoring: / EKG:  The patient was maintained on a cardiac monitor.  I personally viewed and interpreted the cardiac monitored which showed an underlying rhythm of: Sinus rhythm    Test / Admission - Considered:  The patient's pain is improved to 3 out of 10 in severity at this time and is described as more being close to her neck where she had the recent fracture.  No ischemic changes on EKG, negative troponins.  No suspicion at this time of ACS.  Patient is not short of breath, very low clinical suspicion of PE.  No signs of dissection at this time.  No pneumonia on chest x-ray and lungs are clear to auscultation bilaterally.  I feel this is likely musculoskeletal in nature.  Patient may make a follow-up appointment with her cardiologist to  discuss her recent chest pain but I also recommend keeping the planned outpatient follow-up for her recent fractures.  Return precautions provided  Final Clinical Impression(s) / ED Diagnoses Final diagnoses:  Chest pain, unspecified type    Rx / DC Orders ED Discharge Orders     None         Ronny Bacon 08/05/22 1246    Blanchie Dessert, MD 08/09/22 1318

## 2022-08-08 DIAGNOSIS — S0292XD Unspecified fracture of facial bones, subsequent encounter for fracture with routine healing: Secondary | ICD-10-CM | POA: Insufficient documentation

## 2022-08-08 DIAGNOSIS — S12501D Unspecified nondisplaced fracture of sixth cervical vertebra, subsequent encounter for fracture with routine healing: Secondary | ICD-10-CM | POA: Insufficient documentation

## 2022-08-08 DIAGNOSIS — M25531 Pain in right wrist: Secondary | ICD-10-CM | POA: Diagnosis not present

## 2022-08-08 DIAGNOSIS — R55 Syncope and collapse: Secondary | ICD-10-CM | POA: Insufficient documentation

## 2022-08-08 DIAGNOSIS — N289 Disorder of kidney and ureter, unspecified: Secondary | ICD-10-CM | POA: Insufficient documentation

## 2022-08-08 NOTE — Assessment & Plan Note (Signed)
Acute causing significant pain Upcoming appointment with neurosurgeon. Pain control with Tylenol and only very low-dose of tramadol.

## 2022-08-08 NOTE — Assessment & Plan Note (Signed)
Acute, likely due to dehydration Reevaluate with labs today.

## 2022-08-08 NOTE — Assessment & Plan Note (Addendum)
Acute, likely related to polypharmacy (tramadol always new medication with wrist procedure),  Dehydration, and poor p.o. intake. ER work-up reviewed, no clear additional secondary causes.  We discussed the need to reduce over sedating medications given her age and increased risk. She will stop benadryl, stop tolterodine.  Encouraged her to decrease gabapentin as tolerated.

## 2022-08-08 NOTE — Assessment & Plan Note (Addendum)
Acute, followed by ear nose and throat MD.  Awaiting routine healing. If labs are normal.. to treat pain with tylenol twice daily, for severe pain... on full stomach,  during the day could se tramadol 1/2 tablet.

## 2022-08-12 ENCOUNTER — Other Ambulatory Visit: Payer: Self-pay | Admitting: Family Medicine

## 2022-08-15 DIAGNOSIS — M542 Cervicalgia: Secondary | ICD-10-CM | POA: Diagnosis not present

## 2022-08-15 DIAGNOSIS — S12501A Unspecified nondisplaced fracture of sixth cervical vertebra, initial encounter for closed fracture: Secondary | ICD-10-CM | POA: Diagnosis not present

## 2022-08-22 ENCOUNTER — Ambulatory Visit: Payer: Medicare PPO | Admitting: Family Medicine

## 2022-08-22 VITALS — BP 130/60 | HR 66 | Temp 97.6°F | Ht 62.0 in | Wt 133.5 lb

## 2022-08-22 DIAGNOSIS — E119 Type 2 diabetes mellitus without complications: Secondary | ICD-10-CM | POA: Diagnosis not present

## 2022-08-22 DIAGNOSIS — H26493 Other secondary cataract, bilateral: Secondary | ICD-10-CM | POA: Diagnosis not present

## 2022-08-22 DIAGNOSIS — Z961 Presence of intraocular lens: Secondary | ICD-10-CM | POA: Diagnosis not present

## 2022-08-22 DIAGNOSIS — N3941 Urge incontinence: Secondary | ICD-10-CM

## 2022-08-22 DIAGNOSIS — E1159 Type 2 diabetes mellitus with other circulatory complications: Secondary | ICD-10-CM

## 2022-08-22 DIAGNOSIS — I152 Hypertension secondary to endocrine disorders: Secondary | ICD-10-CM | POA: Diagnosis not present

## 2022-08-22 DIAGNOSIS — Z135 Encounter for screening for eye and ear disorders: Secondary | ICD-10-CM | POA: Diagnosis not present

## 2022-08-22 DIAGNOSIS — H353131 Nonexudative age-related macular degeneration, bilateral, early dry stage: Secondary | ICD-10-CM | POA: Diagnosis not present

## 2022-08-22 LAB — POCT GLYCOSYLATED HEMOGLOBIN (HGB A1C): Hemoglobin A1C: 7 % — AB (ref 4.0–5.6)

## 2022-08-22 MED ORDER — TOLTERODINE TARTRATE ER 4 MG PO CP24: 4.0000 mg | ORAL_CAPSULE | Freq: Every day | ORAL | 11 refills | Status: DC

## 2022-08-22 NOTE — Progress Notes (Signed)
Patient ID: Morgan [name], female    DOB: [birth year], 81 y.o.   MRN: [id number]  This visit was conducted in person.  BP 130/60   Pulse 66   Temp 97.6 F (36.4 C) (Temporal)   Ht '5\' 2"'$  (1.575 m)   Wt 133 lb 8 oz (60.6 kg)   SpO2 99%   BMI 24.42 kg/m    CC:  Chief Complaint  Patient presents with   Diabetes    Subjective:   HPI: Morgan [name] is a 81 y.o. female presenting on 08/22/2022 for Diabetes  Diabetes:  Improving control with lifestyle control on metformin XR 500 mg 2 tablets twice daily Lab Results  Component Value Date   HGBA1C 7.0 (A) 08/22/2022  Using medications without difficulties: none Hypoglycemic episodes: none Hyperglycemic episodes: none Feet problems: none Blood Sugars averaging:  FBS 130-144 eye exam within last year: Today.  Has lost 7 lbs given laceration on lip Wt Readings from Last 3 Encounters:  08/22/22 133 lb 8 oz (60.6 kg)  08/03/22 133 lb (60.3 kg)  07/29/22 140 lb (63.5 kg)   Body mass index is 24.42 kg/m.   Hypertension:  Well controlled on metoprolol XL 100 mg p.o. daily   Just started back on CPAP machine. BP Readings from Last 3 Encounters:  08/22/22 130/60  08/05/22 122/66  08/03/22 (!) 156/78  Using medication without problems or lightheadedness:  Chest pain with exertion: none... of note reviewed ER visit from 9/23 for atypical chest pain... cardiac evaluation negative.. felt like due to recent fracture in spine. Edema: none Short of breath: none Average home BPs: Other issues:    Given recent syncopal spell... we are trying to decrease sedating medications.  No  further presyncope or dizziness.   Planning to start back this week for PT for right arm pain.   Saw neurosurgery regarding her neck vertebral fracture... no surgical indication.. pain is improving... using tylenol as needed.   Continuing to have urinary incontinence, no dysuria. She has decreased tolterdine to  1 tablet... this has worsened her  issue.  Was doing much better on dettrol LA 4 mg daily... she does not feel it was sedating her.  Relevant past medical, surgical, family and social history reviewed and updated as indicated. Interim medical history since our last visit reviewed. Allergies and medications reviewed and updated. Outpatient Medications Prior to Visit  Medication Sig Dispense Refill   Alum & Mag Hydroxide-Simeth (MAALOX REGULAR STRENGTH PO) Take by mouth daily as needed. Patient takes 1 capful daily of the powder formulation.     aspirin EC 81 MG tablet Take 1 tablet (81 mg total) by mouth daily.     atorvastatin (LIPITOR) 80 MG tablet TAKE 1 TABLET BY MOUTH EVERY DAY 90 tablet 3   BLINK TEARS 0.25 % SOLN Place 1 drop into both eyes daily as needed (dry/irritated eyes.).     calcium-vitamin D (OSCAL WITH D) 500-200 MG-UNIT tablet Take 1 tablet by mouth 2 (two) times daily.     chlorthalidone (HYGROTON) 25 MG tablet TAKE 1 TABLET BY MOUTH EVERY DAY 90 tablet 3   CINNAMON PO Take 1,000 mg by mouth in the morning and at bedtime.     ferrous sulfate 325 (65 FE) MG tablet Take 325 mg by mouth daily.     gabapentin (NEURONTIN) 300 MG capsule TAKE 2 CAPSULES BY MOUTH AT BEDTIME 180 capsule 1   Lancet Device MISC Use to check blood sugar twice daily. 1 each 0  Lancets (FREESTYLE) lancets Use to check blood sugar twice daily 100 each 11   metFORMIN (GLUCOPHAGE-XR) 500 MG 24 hr tablet TAKE 2 TABLETS BY MOUTH TWICE A DAY 360 tablet 3   methylcellulose (CITRUCEL) oral powder Use as directed daily     metoprolol succinate (TOPROL-XL) 100 MG 24 hr tablet TAKE 1 TABLET BY MOUTH DAILY. TAKE WITH OR IMMEDIATELY FOLLOWING A MEAL. 90 tablet 3   Multiple Vitamins-Minerals (PRESERVISION AREDS PO) Take 1 tablet by mouth 2 (two) times daily. Reported on 12/23/2015     nitroGLYCERIN (NITROSTAT) 0.4 MG SL tablet PLACE 1 TABLET (0.4 MG TOTAL) UNDER THE TONGUE EVERY 5 (FIVE) MINUTES AS NEEDED FOR CHEST PAIN 25 tablet 0   ONETOUCH ULTRA test  strip USE TO CHECK BLOOD SUGAR TWICE DAILY 150 strip 3   Probiotic Product (ALIGN PO) Take by mouth.     QUEtiapine (SEROQUEL) 25 MG tablet TAKE 1 TABLET BY MOUTH EVERYDAY AT BEDTIME 90 tablet 1   rOPINIRole (REQUIP) 1 MG tablet 1/2 tablet during the day, 2 tablets at bedtime 90 tablet 11   venlafaxine XR (EFFEXOR-XR) 75 MG 24 hr capsule TAKE 1 CAPSULE BY MOUTH DAILY WITH BREAKFAST. 90 capsule 1   vitamin B-12 (CYANOCOBALAMIN) 1000 MCG tablet Take 1,000 mcg by mouth daily.     amoxicillin-clavulanate (AUGMENTIN) 875-125 MG tablet Take 1 tablet by mouth every 12 (twelve) hours. 14 tablet 0   No facility-administered medications prior to visit.     Per HPI unless specifically indicated in ROS section below Review of Systems  Constitutional:  Negative for fatigue and fever.  HENT:  Negative for congestion.   Eyes:  Negative for pain.  Respiratory:  Negative for cough and shortness of breath.   Cardiovascular:  Negative for chest pain, palpitations and leg swelling.  Gastrointestinal:  Negative for abdominal pain.  Genitourinary:  Negative for dysuria and vaginal bleeding.  Musculoskeletal:  Negative for back pain.  Neurological:  Negative for syncope, light-headedness and headaches.  Psychiatric/Behavioral:  Negative for dysphoric mood.    Objective:  BP 130/60   Pulse 66   Temp 97.6 F (36.4 C) (Temporal)   Ht '5\' 2"'$  (1.575 m)   Wt 133 lb 8 oz (60.6 kg)   SpO2 99%   BMI 24.42 kg/m   Wt Readings from Last 3 Encounters:  08/22/22 133 lb 8 oz (60.6 kg)  08/03/22 133 lb (60.3 kg)  07/29/22 140 lb (63.5 kg)      Physical Exam Constitutional:      General: She is not in acute distress.    Appearance: Normal appearance. She is well-developed. She is not ill-appearing or toxic-appearing.  HENT:     Head: Normocephalic.     Right Ear: Hearing, tympanic membrane, ear canal and external ear normal. Tympanic membrane is not erythematous, retracted or bulging.     Left Ear: Hearing,  tympanic membrane, ear canal and external ear normal. Tympanic membrane is not erythematous, retracted or bulging.     Nose: No mucosal edema or rhinorrhea.     Right Sinus: No maxillary sinus tenderness or frontal sinus tenderness.     Left Sinus: No maxillary sinus tenderness or frontal sinus tenderness.     Mouth/Throat:     Pharynx: Uvula midline.  Eyes:     General: Lids are normal. Lids are everted, no foreign bodies appreciated.     Conjunctiva/sclera: Conjunctivae normal.     Pupils: Pupils are equal, round, and reactive to light.  Neck:  Thyroid: No thyroid mass or thyromegaly.     Vascular: No carotid bruit.     Trachea: Trachea normal.  Cardiovascular:     Rate and Rhythm: Normal rate and regular rhythm.     Pulses: Normal pulses.     Heart sounds: Normal heart sounds, S1 normal and S2 normal. No murmur heard.    No friction rub. No gallop.  Pulmonary:     Effort: Pulmonary effort is normal. No tachypnea or respiratory distress.     Breath sounds: Normal breath sounds. No decreased breath sounds, wheezing, rhonchi or rales.  Abdominal:     General: Bowel sounds are normal.     Palpations: Abdomen is soft.     Tenderness: There is no abdominal tenderness.  Musculoskeletal:     Cervical back: Normal range of motion and neck supple.  Skin:    General: Skin is warm and dry.     Findings: No rash.  Neurological:     Mental Status: She is alert.  Psychiatric:        Mood and Affect: Mood is not anxious or depressed.        Speech: Speech normal.        Behavior: Behavior normal. Behavior is cooperative.        Thought Content: Thought content normal.        Judgment: Judgment normal.       Results for orders placed or performed in visit on 08/22/22  POCT glycosylated hemoglobin (Hb A1C)  Result Value Ref Range   Hemoglobin A1C 7.0 (A) 4.0 - 5.6 %   HbA1c POC (<> result, manual entry)     HbA1c, POC (prediabetic range)     HbA1c, POC (controlled diabetic range)        COVID 19 screen:  No recent travel or known exposure to COVID19 The patient denies respiratory symptoms of COVID 19 at this time. The importance of social distancing was discussed today.   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 Continue metformin XR 500 mg 2 tablets twice daily Encouraged exercise, weight loss, healthy eating habits.       Relevant Orders   POCT glycosylated hemoglobin (Hb A1C) (Completed)   Urge incontinence    Chronic, worsening control with decrease of Detrol LA.  She has poor quality of life with this poorly controlled urge incontinence.  She would like to increase the Detrol LA back to 4 mg daily.  She states that she has noted associated sedation with this particular medication.  We discussed the risks and the benefits and she would like to proceed.      Relevant Medications   tolterodine (DETROL LA) 4 MG 24 hr capsule     Meds ordered this encounter  Medications   tolterodine (DETROL LA) 4 MG 24 hr capsule    Sig: Take 1 capsule (4 mg total) by mouth daily.    Dispense:  30 capsule    Refill:  [place], MD

## 2022-08-22 NOTE — Assessment & Plan Note (Signed)
Well controlled on metoprolol XL 100 mg p.o. daily

## 2022-08-22 NOTE — Assessment & Plan Note (Signed)
Chronic, improving control with recent weight loss of 7 pounds. Continue metformin XR 500 mg 2 tablets twice daily Encouraged exercise, weight loss, healthy eating habits.

## 2022-08-22 NOTE — Patient Instructions (Addendum)
Set up yearly eye exam for diabetes and have the opthalmologist send [place] a copy of the evaluation for the chart.  Restart  detrol AL 4 mg daoily for urinary incontinence.

## 2022-08-22 NOTE — Assessment & Plan Note (Signed)
Chronic, worsening control with decrease of Detrol LA.  She has poor quality of life with this poorly controlled urge incontinence.  She would like to increase the Detrol LA back to 4 mg daily.  She states that she has noted associated sedation with this particular medication.  We discussed the risks and the benefits and she would like to proceed.

## 2022-08-26 ENCOUNTER — Other Ambulatory Visit: Payer: Self-pay | Admitting: Family Medicine

## 2022-09-05 ENCOUNTER — Encounter: Payer: Self-pay | Admitting: Physician Assistant

## 2022-09-05 ENCOUNTER — Ambulatory Visit: Payer: Medicare PPO | Attending: Physician Assistant | Admitting: Physician Assistant

## 2022-09-05 VITALS — BP 140/84 | HR 63 | Ht 63.0 in | Wt 134.8 lb

## 2022-09-05 DIAGNOSIS — R002 Palpitations: Secondary | ICD-10-CM | POA: Diagnosis not present

## 2022-09-05 DIAGNOSIS — E785 Hyperlipidemia, unspecified: Secondary | ICD-10-CM

## 2022-09-05 DIAGNOSIS — G2581 Restless legs syndrome: Secondary | ICD-10-CM | POA: Diagnosis not present

## 2022-09-05 DIAGNOSIS — I2583 Coronary atherosclerosis due to lipid rich plaque: Secondary | ICD-10-CM

## 2022-09-05 DIAGNOSIS — E7800 Pure hypercholesterolemia, unspecified: Secondary | ICD-10-CM | POA: Diagnosis not present

## 2022-09-05 DIAGNOSIS — I2510 Atherosclerotic heart disease of native coronary artery without angina pectoris: Secondary | ICD-10-CM

## 2022-09-05 MED ORDER — NITROGLYCERIN 0.4 MG SL SUBL: SUBLINGUAL_TABLET | SUBLINGUAL | 3 refills | Status: AC

## 2022-09-05 NOTE — Patient Instructions (Signed)
Medication Instructions:  Your physician recommends that you continue on your current medications as directed. Please refer to the Current Medication list given to you today.  *If you need a refill on your cardiac medications before your next appointment, please call your pharmacy*   Lab Work: Fasting lipid and lft's a few days prior to 3 month follow up  If you have labs (blood work) drawn today and your tests are completely normal, you will receive your results only by: [place] (if you have MyChart) OR A paper copy in the mail If you have any lab test that is abnormal or we need to change your treatment, we will call you to review the results.   Follow-Up: At [facility], you and your health needs are our priority.  As part of our continuing mission to provide you with exceptional heart care, we have created designated Provider Care Teams.  These Care Teams include your primary Cardiologist (physician) and Advanced Practice Providers (APPs -  Physician Assistants and Nurse Practitioners) who all work together to provide you with the care you need, when you need it.   Your next appointment:   3 month(s)  The format for your next appointment:   In Person  Provider:   Candee Furbish, MD    Important Information About Sugar

## 2022-09-05 NOTE — Progress Notes (Signed)
Office Visit    Patient Name: Morgan [name] Date of Encounter: 09/05/2022  PCP:  Jinny Sanders, MD   [place]  Cardiologist:  Candee Furbish, MD  Advanced Practice Provider:  No care team member to display Electrophysiologist:  None   HPI    Morgan [name] is a 81 y.o. female with a past medical history significant for CAD, hypertension, and atrial fibrillation presents today for follow-up visit and hospital follow-up.  Patient has a history of coronary bypass grafting in 2006 and diagonal stent after CABG.  Cerebellar stroke in 2015.  Angioedema right posterior neck melanoma discovered by Dr. Radford Pax.  Nuclear stress test 2017 was reassuring.  Had a fundoplication for her esophagus in March 2021.  Prior shortness of breath attributed to deconditioning.  Echo was reassuring.  Reported shortness of breath with incline and fatigue.  She had some poor balance and broke both of her arms due to a fall.  Last seen 07/2021 and was doing well at that time.  She had been getting over a COVID infection.  She did not take enough medications for her trip to [place] and was off medicines for 4 days.  By the end of her trip she was experiencing hallucinations, racing heart and word slurring.  On her way back home she called her daughter and she scheduled an appointment and was told to half her clonazepam and an MRI was ordered.  She was feeling better by the time she got to her appointment with Dr. Marlou Porch.  Eventually Dr. Diona Browner discontinued her clonazepam completely.  She was also having pounding palpitations and was currently taking metoprolol succinate 100 mg daily.  Today, she tells me she had a bad fall around October 15 and the following week she had to go back to the ER for chest pain.  She had a full cardiac work-up including lab work and EKG and everything was negative.  Thought to be anxiety.  During her fall she broke her cheekbone and nose.  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  She took her sleeping pill before bed as well as a tramadol for wrist pain and collapsed.  She woke up in a pool of blood.  She could not move and her blood pressure was very low.  They did a CT scan of the head as well as an MRI.  She ended up with 7 stitches in her upper lip.  She did not have a brain bleed but has had 1 in the past.  She tells me that any chest pain that she has had she thinks is more related to indigestion.  She says it is a burning in the center of her chest usually after food.  She used to take something for it but since she had surgery on her esophagus its been much better controlled.  Reports no shortness of breath nor dyspnea on exertion. Reports no chest pain, pressure, or tightness. No edema, orthopnea, PND. Reports no palpitations.    Past Medical History    Past Medical History:  Diagnosis Date   Angina    Arthritis    Atrial fibrillation ([place])    ASPIRIN FOR BLOOD THINNER   Atypical mole 09/22/2016   Bulging discs    lumbar    Complication of anesthesia    pt states has choking sensation with ET tube    Coronary artery  disease    Depression    Diabetes mellitus    diet controlled/on meds   Family history of breast cancer    Family history of colon cancer    Fibromyalgia    GERD (gastroesophageal reflux disease)    H/O hiatal hernia    Headache(784.0)    "recurring"   High cholesterol    History of colon polyps    Hyperlipidemia    Hypertension    [place] esophagus    Melanoma ([place])    melanoma   Migraines    "til ~ 1980"   PONV (postoperative nausea and vomiting)    Restless leg syndrome    Sciatic nerve pain    "from pinched nerve"   Sleep apnea    uses CPAP   Stroke ([place]) 2014   no deficits   Weakness of right side of body    "I've had PT for it; they don't know what it's from".  CORTISONE INJECTION INTO BACK 08/30/12   Past Surgical History:   Procedure Laterality Date   62 HOUR [place] STUDY N/A 12/29/2015   Procedure: 24 HOUR PH STUDY;  Surgeon: Manus Gunning, MD;  Location: [facility];  Service: Gastroenterology;  Laterality: N/A;   CARPAL TUNNEL RELEASE Left 07/02/2015   Procedure: CARPAL TUNNEL RELEASE;  Surgeon: Frederik Pear, MD;  Location: [place];  Service: Orthopedics;  Laterality: Left;   CATARACT EXTRACTION, BILATERAL Bilateral    Oct and Nov 2017   COLONOSCOPY  06/30/2019   CORONARY ANGIOPLASTY WITH STENT PLACEMENT  06/2011   "1"   CORONARY ARTERY BYPASS GRAFT  2005   CABG X 2   DILATION AND CURETTAGE OF UTERUS     "more than once"   ELBOW ARTHROSCOPY Left 07/02/2015   Procedure: ARTHROSCOPY LEFT ELBOW WITH DEBRIDEMENT AND REMOVAL LOOSE BODY;  Surgeon: Frederik Pear, MD;  Location: [place];  Service: Orthopedics;  Laterality: Left;   ESOPHAGEAL DILATION  01/21/2020   Procedure: ESOPHAGEAL DILATION;  Surgeon: Lavena Bullion, DO;  Location: [facility];  Service: Gastroenterology;;   ESOPHAGEAL MANOMETRY N/A 12/29/2015   Procedure: ESOPHAGEAL MANOMETRY (EM);  Surgeon: Manus Gunning, MD;  Location: [facility];  Service: Gastroenterology;  Laterality: N/A;   ESOPHAGEAL MANOMETRY N/A 07/30/2019   Procedure: ESOPHAGEAL MANOMETRY (EM);  Surgeon: Yetta Flock, MD;  [facility]: [facility];  Service: Gastroenterology;  Laterality: N/A;   ESOPHAGOGASTRODUODENOSCOPY (EGD) WITH PROPOFOL N/A 01/21/2020   Procedure: ESOPHAGOGASTRODUODENOSCOPY (EGD) WITH PROPOFOL;  Surgeon: Lavena Bullion, DO;  [facility]: [facility];  Service: Gastroenterology;  Laterality: N/A;   FRACTURE SURGERY  ~ 2005   nose   KNEE ARTHROSCOPY  09/04/2012   Procedure: ARTHROSCOPY KNEE;  Surgeon: Gearlean Alf, MD;  Location: [facility];  Service: Orthopedics;  Laterality: Right;  right knee arthroscopy with medial and lateral meniscus debridement   MELANOMA EXCISION Right 10/13/2016   right side of  neck   MOUTH SURGERY  2004   "bone replacement; had cadavear bones put in; face was collapsing"   NASAL SEPTUM SURGERY  ~ [place] Right 07/07/2013   Procedure: RIGHT TOTAL KNEE ARTHROPLASTY;  Surgeon: Gearlean Alf, MD;  Location: [facility];  Service: Orthopedics;  Laterality: Right;   TRANSORAL INCISIONLESS FUNDOPLICATION N/A 2/63/7858   Procedure: TRANSORAL INCISIONLESS FUNDOPLICATION;  Surgeon: Lavena Bullion, DO;  Location: [facility];  Service: Gastroenterology;  Laterality: N/A;   UPPER GASTROINTESTINAL ENDOSCOPY      Allergies  Allergies  Allergen Reactions   Hydrocodone-Acetaminophen Other (See Comments)    "Changed personality" "made me very mean"   Percocet [Oxycodone-Acetaminophen] Other (See Comments)    hallucination   Sulfa Antibiotics Other (See Comments)    hallucinations   EKGs/Labs/Other Studies Reviewed:   The following studies were reviewed today:  Monitor 08/04/2021  Sinus rhythm 62 bpm on average Brief atrial tachycardia episodes Rare PVC/PAC's No pauses, no atrial fibrillation, no VT Reassuring    EKG:  EKG is not  ordered today.   Recent Labs: 02/07/2022: TSH 1.96 07/29/2022: ALT 28 08/05/2022: BUN 26; Creatinine, Ser 0.95; Hemoglobin 11.2; Platelets 307; Potassium 3.8; Sodium 140  Recent Lipid Panel    Component Value Date/Time   CHOL 113 01/30/2022 0903   TRIG 62.0 01/30/2022 0903   HDL 55.50 01/30/2022 0903   CHOLHDL 2 01/30/2022 0903   VLDL 12.4 01/30/2022 0903   LDLCALC 45 01/30/2022 0903   Home Medications   Current Meds  Medication Sig   Alum & Mag Hydroxide-Simeth (MAALOX REGULAR STRENGTH PO) Take by mouth daily as needed. Patient takes 1 capful daily of the powder formulation.   aspirin EC 81 MG tablet Take 1 tablet (81 mg total) by mouth daily.   atorvastatin (LIPITOR) 80 MG tablet TAKE 1 TABLET BY MOUTH EVERY DAY   BLINK TEARS 0.25 % SOLN Place 1 drop into both eyes daily as needed (dry/irritated  eyes.).   calcium-vitamin D (OSCAL WITH D) 500-200 MG-UNIT tablet Take 1 tablet by mouth 2 (two) times daily.   chlorthalidone (HYGROTON) 25 MG tablet TAKE 1 TABLET BY MOUTH EVERY DAY   CINNAMON PO Take 1,000 mg by mouth in the morning and at bedtime.   ferrous sulfate 325 (65 FE) MG tablet Take 325 mg by mouth daily.   gabapentin (NEURONTIN) 300 MG capsule TAKE 2 CAPSULES BY MOUTH AT BEDTIME   Lancet Device MISC Use to check blood sugar twice daily.   Lancets (FREESTYLE) lancets Use to check blood sugar twice daily   metFORMIN (GLUCOPHAGE-XR) 500 MG 24 hr tablet TAKE 2 TABLETS BY MOUTH TWICE A DAY   methylcellulose (CITRUCEL) oral powder Use as directed daily   metoprolol succinate (TOPROL-XL) 100 MG 24 hr tablet TAKE 1 TABLET BY MOUTH DAILY. TAKE WITH OR IMMEDIATELY FOLLOWING A MEAL.   Multiple Vitamins-Minerals (PRESERVISION AREDS PO) Take 1 tablet by mouth 2 (two) times daily. Reported on 12/23/2015   ONETOUCH ULTRA test strip USE TO CHECK BLOOD SUGAR TWICE DAILY   Probiotic Product (ALIGN PO) Take by mouth at bedtime.   QUEtiapine (SEROQUEL) 25 MG tablet TAKE 1 TABLET BY MOUTH EVERYDAY AT BEDTIME   rOPINIRole (REQUIP) 1 MG tablet 1/2 tablet during the day, 2 tablets at bedtime   tolterodine (DETROL LA) 4 MG 24 hr capsule Take 1 capsule (4 mg total) by mouth daily.   venlafaxine XR (EFFEXOR-XR) 75 MG 24 hr capsule TAKE 1 CAPSULE BY MOUTH DAILY WITH BREAKFAST.   vitamin B-12 (CYANOCOBALAMIN) 1000 MCG tablet Take 1,000 mcg by mouth daily.   [DISCONTINUED] nitroGLYCERIN (NITROSTAT) 0.4 MG SL tablet PLACE 1 TABLET (0.4 MG TOTAL) UNDER THE TONGUE EVERY 5 (FIVE) MINUTES AS NEEDED FOR CHEST PAIN     Review of Systems      All other systems reviewed and are otherwise negative except as noted above.  Physical Exam    VS:  BP (!) 140/84   Pulse 63   Ht '5\' 3"'$  (1.6 m)   Wt 134 lb 12.8 oz (  61.1 kg)   SpO2 97%   BMI 23.88 kg/m  , BMI Body mass index is 23.88 kg/m.  Wt Readings from Last 3  Encounters:  09/05/22 134 lb 12.8 oz (61.1 kg)  08/22/22 133 lb 8 oz (60.6 kg)  08/03/22 133 lb (60.3 kg)     GEN: Well nourished, well developed, in no acute distress. HEENT: normal. Neck: Supple, no JVD, carotid bruits, or masses. Cardiac: RRR, no murmurs, rubs, or gallops. No clubbing, cyanosis, edema.  Radials/PT 2+ and equal bilaterally.  Respiratory:  Respirations regular and unlabored, clear to auscultation bilaterally. GI: Soft, nontender, nondistended. MS: No deformity or atrophy. Skin: Warm and dry, no rash. Neuro:  Strength and sensation are intact. Psych: Normal affect.  Assessment & Plan    Paroxysmal atrial fibrillation -no recent issues -continue current medications  Coronary artery disease -burning pain in her chest, did take medication for years -surgery on her esophagus, has been well controlled -Continue current medication regimen which includes ASA 81 mg, Lipitor 80 mg, metoprolol succinate 100 mg, nitro as needed  Palpitations -getting stamina back the fall whipped her out.  -well controlled on metoprolol  Hyperlipidemia -Lipid panel 01/2022 with LDL 45, HDL 55, total cholesterol 113, triglycerides 62 -We will be due for repeat lipid panel March 2024 -Continue Lipitor 80 mg daily  Disposition: Follow up 3 months  with Candee Furbish, MD or APP.  Signed, Elgie Collard, PA-C 09/05/2022, 7:40 PM [facility]

## 2022-09-06 DIAGNOSIS — H26493 Other secondary cataract, bilateral: Secondary | ICD-10-CM | POA: Diagnosis not present

## 2022-09-07 ENCOUNTER — Telehealth: Payer: Self-pay | Admitting: Family Medicine

## 2022-09-07 NOTE — Telephone Encounter (Signed)
Access nurse called in to document that they advised patient to call 911 due to her passing out this morning,and she's fatigue,and she does has a history of cardiac history and stroke. Patient refused to call 911. Patient would need a follow up phone call.

## 2022-09-07 NOTE — Telephone Encounter (Signed)
Called daughter back. States mom is feeling better but she is concerned about loss of time after she collapsed at husbands appointment. She has time of about 2 hrs that she does not remember. They advised her at that time ( at [place]) to go to med center [place] at that time but patient refused. Feels like mom is doing better but she is just very fatigued. She is declining ED now because she is not currently experiencing any symptoms. Have reviewed all information with Dr. Diona Browner due to scheduling  she does not have any thing open until next week recommends that patient go to urgent care/ ED for evaluation. If patient refuses that we can get in at first available with ED/UC precautions. While on the phone with daughter reviewed ED/UC precautions. She as requested that we call and give patient information.   I have called patient and set up for first available. She declined. I have reviewed everything with and had repeat back to me all precautions. She has agreed if any symptoms we reviewed or anything new she will go to ED. Stressed to her even if she does not have new symptoms but feels like the would like to get checked out before appointment please go to ED/UC. She agreed. She will call our office if any other questions.   Patient Name: Morgan [name] Gender: Female DOB: [birth year] Age: 81 Y 37 M 15 D Return Phone Number: [phone] (Primary), [phone] (Secondary) Address: City/ State/ Zip: [place] [address] Client [facility] - Client Client Site [facility] - Day Provider Eliezer Lofts - MD Contact Type Call Who Is Calling Patient / Member / Family / Caregiver Call Type Triage / Clinical Caller Name Lovey Newcomer Little Relationship To Patient Daughter Return Phone Number [phone] (Primary) Chief Complaint FAINTING or [place] Reason for Call Symptomatic / Request for [place] states her mom has had  memory loss today and passed out. Translation No Nurse Assessment Nurse: Eugenio Hoes, RN, Jenny Reichmann Date/Time (Eastern Time): 09/07/2022 1:46:03 PM Confirm and document reason for call. If symptomatic, describe symptoms. ---Caller states that her mother passed out today and has had memory loss. Caller states that she passed out this morning around 8am. Caller states that she is more tired than normal. Does the patient have any new or worsening symptoms? ---Yes Will a triage be completed? ---Yes Related visit to physician within the last 2 weeks? ---Yes Does the PT have any chronic conditions? (i.e. diabetes, asthma, this includes High risk factors for pregnancy, etc.) ---Yes List chronic conditions. ---DM, heart problems, TIA, stroke Is this a behavioral health or substance abuse call? ---No Guidelines Guideline Title Affirmed Question Affirmed Notes Nurse Date/Time (Eastern Time) Fainting Age > 50 years (Exception: Occurred > 1 hour ago AND now feels completely fine.) Eugenio Hoes, RN, Jenny Reichmann 09/07/2022 1:48:21 PM Disp. Time Eilene Ghazi Time) Disposition Final User 09/07/2022 1:43:39 PM Send to Urgent Queue [place], [place] PLEASE NOTE: All timestamps contained within this report are represented as [place] Standard Time. CONFIDENTIALTY NOTICE: This fax transmission is intended only for the addressee. It contains information that is legally privileged, confidential or otherwise protected from use or disclosure. If you are not the intended recipient, you are strictly prohibited from reviewing, disclosing, copying using or disseminating any of this information or taking any action in reliance on or regarding this information. If you have received this fax in error, please notify us immediately by telephone so that we can  arrange for its return to [place]. Phone: [phone], Toll-Free: [phone], Fax: [phone] Page: 2 of 2 Call Id: [id number] [place]. Time Eilene Ghazi Time) Disposition Final  User 09/07/2022 1:57:31 PM Call EMS 911 Now Yes Lynett Fish 09/07/2022 1:58:14 PM 911 Outcome Documentation Eugenio Hoes, RN, Jenny Reichmann Reason: Caller states that she will not be calling 911 and would rather be seen in the office if she can Final Disposition 09/07/2022 1:57:31 PM Call EMS 911 Now Yes Eugenio Hoes, RN, Jenny Reichmann Caller Disagree/Comply Disagree Caller Understands Yes PreDisposition InappropriateToAsk Care Advice Given Per Guideline CALL EMS 911 NOW: CARE ADVICE given per Fainting (Adult) guideline.

## 2022-09-07 NOTE — Telephone Encounter (Signed)
Patient called and stated she has had memory loss and collapse today at her husband appointment at the [place]. Patient was sent to acces nurse.

## 2022-09-07 NOTE — Telephone Encounter (Signed)
Spoke with Morgan [name].. pt needs to be seen today ASAP at ER or at least Urgent Care. If she continues to refuse please see if there are any open office visit with my partners on Monday or add her onto my schedule Tuesday.  Give her ER precautions

## 2022-09-07 NOTE — Telephone Encounter (Signed)
Agreed needed triage. Triage reviewed.

## 2022-09-12 ENCOUNTER — Encounter: Payer: Self-pay | Admitting: Family Medicine

## 2022-09-12 ENCOUNTER — Ambulatory Visit: Payer: Medicare PPO | Admitting: Family Medicine

## 2022-09-12 VITALS — BP 140/60 | HR 80 | Temp 97.7°F | Ht 63.0 in | Wt 134.4 lb

## 2022-09-12 DIAGNOSIS — R404 Transient alteration of awareness: Secondary | ICD-10-CM

## 2022-09-12 DIAGNOSIS — Z8673 Personal history of transient ischemic attack (TIA), and cerebral infarction without residual deficits: Secondary | ICD-10-CM | POA: Diagnosis not present

## 2022-09-12 NOTE — Patient Instructions (Addendum)
Stop Seroquel at bedtime as it may be oversedating.  Please stop at the lab to have labs drawn. Please have your husband sen a MyChart account of your symptoms during the spell.. especially awareness level, ability to interact, ? Loss of consciousness etc.  Any seizure like movements?

## 2022-09-12 NOTE — Progress Notes (Signed)
Patient ID: Morgan [name], female    DOB: 04-06-1941, 81 y.o.   MRN: [id number]  This visit was conducted in person.  BP (!) 140/60   Pulse 80   Temp 97.7 F (36.5 C) (Oral)   Ht '5\' 3"'$  (1.6 m)   Wt 134 lb 6 oz (61 kg)   SpO2 98%   BMI 23.80 kg/m    CC: Chief Complaint  Patient presents with   Collapse    At [facility] on 09/07/22 (Patient mentioned that she had laser eye surgery the day before- No episodes since the [place]   Memory Loss    During episode at [place]:   HPI: Morgan [name] is a 81 y.o. female presenting on 09/12/2022 for Collapse (At [place] on 09/07/22 (Patient mentioned that she had laser eye surgery the day before-/No episodes since the [place]) and Memory Loss (During episode at [place])    ON 09/07/2022... was doing fine that AM, dressed, ate etc....  then she does not remember car ride over to appt.. she slept or was very groggy.  Was walking slow, at front door she collapsed to floor but husband lower her down.  She was unaware, ? LOC ( she is not sure if she  lost conciousness), but was not able to interact,  starring, but was able to tell them she did not want to got to ER.  Was at husband's Dr. Visit.  Nurse said BP, glucose normal. Refused to go to ER.  No weakness or numbness.  No CP, no SOB. No cold symptoms. No   She remained unaware until she came home. Then she was  cpmltely back to normal, speaking, interacting etc.   She did have laser surgery the day before.   Hx of TIA.  This is her third presyncopal spell in last several years... previous 2 flet to be secondary to medication.  She continues to be on multiple sedating medications.    She states she CANNOT stop gabapentin... has been on this for years.  Detrol LA helping a lot.  Only using 1 tablet of requip.  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 Interim medical history since our  last visit reviewed. Allergies and medications reviewed and updated. Outpatient Medications Prior to Visit  Medication Sig Dispense Refill   Alum & Mag Hydroxide-Simeth (MAALOX REGULAR STRENGTH PO) Take by mouth daily as needed. Patient takes 1 capful daily of the powder formulation.     aspirin EC 81 MG tablet Take 1 tablet (81 mg total) by mouth daily.     atorvastatin (LIPITOR) 80 MG tablet TAKE 1 TABLET BY MOUTH EVERY DAY 90 tablet 3   BLINK TEARS 0.25 % SOLN Place 1 drop into both eyes daily as needed (dry/irritated eyes.).     calcium-vitamin D (OSCAL WITH D) 500-200 MG-UNIT tablet Take 1 tablet by mouth 2 (two) times daily.     chlorthalidone (HYGROTON) 25 MG tablet TAKE 1 TABLET BY MOUTH EVERY DAY 90 tablet 3   CINNAMON PO Take 1,000 mg by mouth in the morning and at bedtime.     ferrous sulfate 325 (65 FE) MG tablet Take 325 mg by mouth daily.     gabapentin (NEURONTIN) 300 MG capsule TAKE 2 CAPSULES BY MOUTH AT BEDTIME 180 capsule 1   Lancet Device MISC Use to check blood sugar twice daily. 1 each 0  Lancets (FREESTYLE) lancets Use to check blood sugar twice daily 100 each 11   metFORMIN (GLUCOPHAGE-XR) 500 MG 24 hr tablet TAKE 2 TABLETS BY MOUTH TWICE A DAY 360 tablet 3   methylcellulose (CITRUCEL) oral powder Use as directed daily     metoprolol succinate (TOPROL-XL) 100 MG 24 hr tablet TAKE 1 TABLET BY MOUTH DAILY. TAKE WITH OR IMMEDIATELY FOLLOWING A MEAL. 90 tablet 3   Multiple Vitamins-Minerals (PRESERVISION AREDS PO) Take 1 tablet by mouth 2 (two) times daily. Reported on 12/23/2015     nitroGLYCERIN (NITROSTAT) 0.4 MG SL tablet PLACE 1 TABLET (0.4 MG TOTAL) UNDER THE TONGUE EVERY 5 (FIVE) MINUTES AS NEEDED FOR CHEST PAIN 25 tablet 3   ONETOUCH ULTRA test strip USE TO CHECK BLOOD SUGAR TWICE DAILY 150 strip 3   Probiotic Product (ALIGN PO) Take by mouth at bedtime.     QUEtiapine (SEROQUEL) 25 MG tablet TAKE 1 TABLET BY MOUTH EVERYDAY AT BEDTIME 90 tablet 1   rOPINIRole  (REQUIP) 1 MG tablet 1/2 tablet during the day, 2 tablets at bedtime 90 tablet 11   tolterodine (DETROL LA) 4 MG 24 hr capsule Take 1 capsule (4 mg total) by mouth daily. 30 capsule 11   venlafaxine XR (EFFEXOR-XR) 75 MG 24 hr capsule TAKE 1 CAPSULE BY MOUTH DAILY WITH BREAKFAST. 90 capsule 1   vitamin B-12 (CYANOCOBALAMIN) 1000 MCG tablet Take 1,000 mcg by mouth daily.     No facility-administered medications prior to visit.     Per HPI unless specifically indicated in ROS section below Review of Systems  Constitutional:  Negative for fatigue and fever.  HENT:  Negative for congestion.   Eyes:  Negative for pain.  Respiratory:  Negative for cough and shortness of breath.   Cardiovascular:  Negative for chest pain, palpitations and leg swelling.  Gastrointestinal:  Negative for abdominal pain.  Genitourinary:  Negative for dysuria and vaginal bleeding.  Musculoskeletal:  Negative for back pain.  Neurological:  Positive for syncope. Negative for light-headedness and headaches.  Psychiatric/Behavioral:  Negative for dysphoric mood.    Objective:  BP (!) 140/60   Pulse 80   Temp 97.7 F (36.5 C) (Oral)   Ht '5\' 3"'$  (1.6 m)   Wt 134 lb 6 oz (61 kg)   SpO2 98%   BMI 23.80 kg/m   Wt Readings from Last 3 Encounters:  09/12/22 134 lb 6 oz (61 kg)  09/05/22 134 lb 12.8 oz (61.1 kg)  08/22/22 133 lb 8 oz (60.6 kg)      Physical Exam Constitutional:      General: She is not in acute distress.    Appearance: Normal appearance. She is well-developed. She is not ill-appearing or toxic-appearing.  HENT:     Head: Normocephalic.     Right Ear: Hearing, tympanic membrane, ear canal and external ear normal. Tympanic membrane is not erythematous, retracted or bulging.     Left Ear: Hearing, tympanic membrane, ear canal and external ear normal. Tympanic membrane is not erythematous, retracted or bulging.     Nose: No mucosal edema or rhinorrhea.     Right Sinus: No maxillary sinus tenderness  or frontal sinus tenderness.     Left Sinus: No maxillary sinus tenderness or frontal sinus tenderness.     Mouth/Throat:     Pharynx: Uvula midline.  Eyes:     General: Lids are normal. Lids are everted, no foreign bodies appreciated.     Conjunctiva/sclera: Conjunctivae normal.  Pupils: Pupils are equal, round, and reactive to light.  Neck:     Thyroid: No thyroid mass or thyromegaly.     Vascular: No carotid bruit.     Trachea: Trachea normal.  Cardiovascular:     Rate and Rhythm: Normal rate and regular rhythm.     Pulses: Normal pulses.     Heart sounds: Normal heart sounds, S1 normal and S2 normal. No murmur heard.    No friction rub. No gallop.  Pulmonary:     Effort: Pulmonary effort is normal. No tachypnea or respiratory distress.     Breath sounds: Normal breath sounds. No decreased breath sounds, wheezing, rhonchi or rales.  Abdominal:     General: Bowel sounds are normal.     Palpations: Abdomen is soft.     Tenderness: There is no abdominal tenderness.  Musculoskeletal:     Cervical back: Normal range of motion and neck supple.  Skin:    General: Skin is warm and dry.     Findings: No rash.  Neurological:     Mental Status: She is alert and oriented to person, place, and time.     GCS: GCS eye subscore is 4. GCS verbal subscore is 5. GCS motor subscore is 6.     Cranial Nerves: No cranial nerve deficit.     Sensory: No sensory deficit.     Motor: No abnormal muscle tone.     Coordination: Coordination normal.     Gait: Gait normal.     Deep Tendon Reflexes: Reflexes are normal and symmetric.     Comments: Nml cerebellar exam   No papilledema  Psychiatric:        Mood and Affect: Mood is not anxious or depressed.        Speech: Speech normal.        Behavior: Behavior normal. Behavior is cooperative.        Thought Content: Thought content normal.        Cognition and Memory: Memory is not impaired. She does not exhibit impaired recent memory or impaired  remote memory.        Judgment: Judgment normal.       Results for orders placed or performed in visit on 08/22/22  POCT glycosylated hemoglobin (Hb A1C)  Result Value Ref Range   Hemoglobin A1C 7.0 (A) 4.0 - 5.6 %   HbA1c POC (<> result, manual entry)     HbA1c, POC (prediabetic range)     HbA1c, POC (controlled diabetic range)     *Note: Due to a large number of results and/or encounters for the requested time period, some results have not been displayed. A complete set of results can be found in Results Review.     COVID 19 screen:  No recent travel or known exposure to COVID19 The patient denies respiratory symptoms of COVID 19 at this time. The importance of social distancing was discussed today.   Assessment and Plan Problem List Items Addressed This Visit     History of TIA (transient ischemic attack) (Chronic)   Other Visit Diagnoses     Altered awareness, transient    -  Primary   Relevant Orders   Comprehensive metabolic panel (Completed)   CBC with Differential/Platelet (Completed)   Ambulatory referral to Neurology   EKG 12-Lead (Completed)      Unclear etiology of transient spell of altered awareness.  We will stop Seroquel at bedtime as it may be over sedating.  We will evaluate with  lab work.  I will contact her husband to try to get more information about the actual event given she is very unclear with limited memory.  Large differential including repeat stroke to seizure to lab abnormality to medication sedation. She is followed by a neurologist in the past for her history of TIA.  I will work on referring her back for further evaluation.  EKG: unchanged from 07/2022  negative T waves not new.   Eliezer Lofts, MD

## 2022-09-13 LAB — COMPREHENSIVE METABOLIC PANEL
ALT: 18 U/L (ref 0–35)
AST: 18 U/L (ref 0–37)
Albumin: 4.2 g/dL (ref 3.5–5.2)
Alkaline Phosphatase: 64 U/L (ref 39–117)
BUN: 31 mg/dL — ABNORMAL HIGH (ref 6–23)
CO2: 30 mEq/L (ref 19–32)
Calcium: 9.8 mg/dL (ref 8.4–10.5)
Chloride: 101 mEq/L (ref 96–112)
Creatinine, Ser: 0.92 mg/dL (ref 0.40–1.20)
GFR: 58.82 mL/min — ABNORMAL LOW (ref >60.00)
Glucose, Bld: 90 mg/dL (ref 70–99)
Potassium: 3.9 mEq/L (ref 3.5–5.1)
Sodium: 141 mEq/L (ref 135–145)
Total Bilirubin: 0.4 mg/dL (ref 0.2–1.2)
Total Protein: 6.6 g/dL (ref 6.0–8.3)

## 2022-09-13 LAB — CBC WITH DIFFERENTIAL/PLATELET
Basophils Absolute: 0 10*3/uL (ref 0.0–0.1)
Basophils Relative: 0.5 % (ref 0.0–3.0)
Eosinophils Absolute: 0.6 10*3/uL (ref 0.0–0.7)
Eosinophils Relative: 8.6 % — ABNORMAL HIGH (ref 0.0–5.0)
HCT: 35.3 % — ABNORMAL LOW (ref 36.0–46.0)
Hemoglobin: 11.6 g/dL — ABNORMAL LOW (ref 12.0–15.0)
Lymphocytes Relative: 25.2 % (ref 12.0–46.0)
Lymphs Abs: 1.8 10*3/uL (ref 0.7–4.0)
MCHC: 33 g/dL (ref 30.0–36.0)
MCV: 93.5 fl (ref 78.0–100.0)
Monocytes Absolute: 0.9 10*3/uL (ref 0.1–1.0)
Monocytes Relative: 12.7 % — ABNORMAL HIGH (ref 3.0–12.0)
Neutro Abs: 3.8 10*3/uL (ref 1.4–7.7)
Neutrophils Relative %: 53 % (ref 43.0–77.0)
Platelets: 284 10*3/uL (ref 150.0–400.0)
RBC: 3.78 Mil/uL — ABNORMAL LOW (ref 3.87–5.11)
RDW: 12.3 % (ref 11.5–15.5)
WBC: 7.2 10*3/uL (ref 4.0–10.5)

## 2022-09-15 ENCOUNTER — Encounter: Payer: Self-pay | Admitting: Family Medicine

## 2022-09-15 NOTE — Telephone Encounter (Signed)
This patient would like instead to be referred to Dr. Brigitte Pulse.  Can you please change the referral and send to their office.  My note is completed

## 2022-09-19 DIAGNOSIS — G5601 Carpal tunnel syndrome, right upper limb: Secondary | ICD-10-CM | POA: Diagnosis not present

## 2022-09-19 DIAGNOSIS — M720 Palmar fascial fibromatosis [Dupuytren]: Secondary | ICD-10-CM | POA: Diagnosis not present

## 2022-09-20 DIAGNOSIS — M65341 Trigger finger, right ring finger: Secondary | ICD-10-CM | POA: Diagnosis not present

## 2022-09-20 DIAGNOSIS — M65331 Trigger finger, right middle finger: Secondary | ICD-10-CM | POA: Diagnosis not present

## 2022-09-20 DIAGNOSIS — S6411XD Injury of median nerve at wrist and hand level of right arm, subsequent encounter: Secondary | ICD-10-CM | POA: Diagnosis not present

## 2022-09-20 DIAGNOSIS — M65351 Trigger finger, right little finger: Secondary | ICD-10-CM | POA: Diagnosis not present

## 2022-09-28 ENCOUNTER — Telehealth: Payer: Self-pay | Admitting: Family Medicine

## 2022-09-28 NOTE — Telephone Encounter (Signed)
Left message for Morgan [name] that fax was received and has been placed in Dr. Rometta Emery office in box to review.

## 2022-09-28 NOTE — Telephone Encounter (Signed)
Becca from [facility] called and stated was a fax received for dexa for the patient. Call back number [phone].

## 2022-10-11 DIAGNOSIS — M65341 Trigger finger, right ring finger: Secondary | ICD-10-CM | POA: Diagnosis not present

## 2022-10-11 DIAGNOSIS — S6411XD Injury of median nerve at wrist and hand level of right arm, subsequent encounter: Secondary | ICD-10-CM | POA: Diagnosis not present

## 2022-10-11 DIAGNOSIS — M65331 Trigger finger, right middle finger: Secondary | ICD-10-CM | POA: Diagnosis not present

## 2022-10-11 DIAGNOSIS — M65351 Trigger finger, right little finger: Secondary | ICD-10-CM | POA: Diagnosis not present

## 2022-10-23 ENCOUNTER — Other Ambulatory Visit: Payer: Self-pay | Admitting: Cardiology

## 2022-10-23 DIAGNOSIS — M65331 Trigger finger, right middle finger: Secondary | ICD-10-CM | POA: Diagnosis not present

## 2022-10-23 DIAGNOSIS — M65341 Trigger finger, right ring finger: Secondary | ICD-10-CM | POA: Diagnosis not present

## 2022-10-23 DIAGNOSIS — S6411XD Injury of median nerve at wrist and hand level of right arm, subsequent encounter: Secondary | ICD-10-CM | POA: Diagnosis not present

## 2022-10-23 DIAGNOSIS — M65351 Trigger finger, right little finger: Secondary | ICD-10-CM | POA: Diagnosis not present

## 2022-11-16 DIAGNOSIS — M720 Palmar fascial fibromatosis [Dupuytren]: Secondary | ICD-10-CM | POA: Diagnosis not present

## 2022-11-22 DIAGNOSIS — S6411XD Injury of median nerve at wrist and hand level of right arm, subsequent encounter: Secondary | ICD-10-CM | POA: Diagnosis not present

## 2022-11-22 DIAGNOSIS — M65331 Trigger finger, right middle finger: Secondary | ICD-10-CM | POA: Diagnosis not present

## 2022-11-22 DIAGNOSIS — M65341 Trigger finger, right ring finger: Secondary | ICD-10-CM | POA: Diagnosis not present

## 2022-11-22 DIAGNOSIS — M65351 Trigger finger, right little finger: Secondary | ICD-10-CM | POA: Diagnosis not present

## 2022-11-27 DIAGNOSIS — E1159 Type 2 diabetes mellitus with other circulatory complications: Secondary | ICD-10-CM | POA: Diagnosis not present

## 2022-11-27 DIAGNOSIS — R531 Weakness: Secondary | ICD-10-CM | POA: Diagnosis not present

## 2022-11-27 DIAGNOSIS — G2581 Restless legs syndrome: Secondary | ICD-10-CM | POA: Diagnosis not present

## 2022-11-27 DIAGNOSIS — R269 Unspecified abnormalities of gait and mobility: Secondary | ICD-10-CM | POA: Diagnosis not present

## 2022-11-27 DIAGNOSIS — S060X1D Concussion with loss of consciousness of 30 minutes or less, subsequent encounter: Secondary | ICD-10-CM | POA: Diagnosis not present

## 2022-11-27 DIAGNOSIS — Z8669 Personal history of other diseases of the nervous system and sense organs: Secondary | ICD-10-CM | POA: Diagnosis not present

## 2022-11-27 DIAGNOSIS — I69998 Other sequelae following unspecified cerebrovascular disease: Secondary | ICD-10-CM | POA: Diagnosis not present

## 2022-11-27 DIAGNOSIS — R55 Syncope and collapse: Secondary | ICD-10-CM | POA: Diagnosis not present

## 2022-11-29 ENCOUNTER — Other Ambulatory Visit: Payer: Self-pay | Admitting: Neurology

## 2022-11-29 DIAGNOSIS — R55 Syncope and collapse: Secondary | ICD-10-CM

## 2022-12-01 DIAGNOSIS — R55 Syncope and collapse: Secondary | ICD-10-CM | POA: Diagnosis not present

## 2022-12-06 ENCOUNTER — Ambulatory Visit: Payer: Medicare PPO | Attending: Physician Assistant

## 2022-12-06 ENCOUNTER — Other Ambulatory Visit: Payer: Self-pay | Admitting: Family Medicine

## 2022-12-06 DIAGNOSIS — E7800 Pure hypercholesterolemia, unspecified: Secondary | ICD-10-CM

## 2022-12-06 DIAGNOSIS — I2510 Atherosclerotic heart disease of native coronary artery without angina pectoris: Secondary | ICD-10-CM | POA: Diagnosis not present

## 2022-12-06 DIAGNOSIS — I2583 Coronary atherosclerosis due to lipid rich plaque: Secondary | ICD-10-CM | POA: Diagnosis not present

## 2022-12-06 DIAGNOSIS — R002 Palpitations: Secondary | ICD-10-CM

## 2022-12-06 DIAGNOSIS — M65351 Trigger finger, right little finger: Secondary | ICD-10-CM | POA: Diagnosis not present

## 2022-12-06 DIAGNOSIS — E785 Hyperlipidemia, unspecified: Secondary | ICD-10-CM | POA: Diagnosis not present

## 2022-12-06 DIAGNOSIS — G2581 Restless legs syndrome: Secondary | ICD-10-CM | POA: Diagnosis not present

## 2022-12-06 DIAGNOSIS — M65331 Trigger finger, right middle finger: Secondary | ICD-10-CM | POA: Diagnosis not present

## 2022-12-06 DIAGNOSIS — M65341 Trigger finger, right ring finger: Secondary | ICD-10-CM | POA: Diagnosis not present

## 2022-12-06 DIAGNOSIS — S6411XD Injury of median nerve at wrist and hand level of right arm, subsequent encounter: Secondary | ICD-10-CM | POA: Diagnosis not present

## 2022-12-06 NOTE — Telephone Encounter (Signed)
Last office visit 09/12/22 Collapse and memory loss.  Last refilled 06/05/22 for #90 with 1 refill.  Next Appt: No future appointments with PCP.

## 2022-12-07 LAB — HEPATIC FUNCTION PANEL
ALT: 19 IU/L (ref 0–32)
AST: 16 IU/L (ref 0–40)
Albumin: 4.4 g/dL (ref 3.8–4.8)
Alkaline Phosphatase: 79 IU/L (ref 44–121)
Bilirubin Total: <0.2 mg/dL (ref 0.0–1.2)
Bilirubin, Direct: <0.1 mg/dL (ref 0.00–0.40)
Total Protein: 6.2 g/dL (ref 6.0–8.5)

## 2022-12-07 LAB — LIPID PANEL
Chol/HDL Ratio: 2.9 ratio (ref 0.0–4.4)
Cholesterol, Total: 150 mg/dL (ref 100–199)
HDL: 52 mg/dL (ref >39)
LDL Chol Calc (NIH): 71 mg/dL (ref 0–99)
Triglycerides: 162 mg/dL — ABNORMAL HIGH (ref 0–149)
VLDL Cholesterol Cal: 27 mg/dL (ref 5–40)

## 2022-12-08 ENCOUNTER — Ambulatory Visit
Admission: RE | Admit: 2022-12-08 | Discharge: 2022-12-08 | Disposition: A | Discharge status: Home / Self Care | Payer: Medicare PPO | Source: Ambulatory Visit | Attending: Neurology | Admitting: Neurology

## 2022-12-08 DIAGNOSIS — R55 Syncope and collapse: Secondary | ICD-10-CM | POA: Insufficient documentation

## 2022-12-14 ENCOUNTER — Other Ambulatory Visit: Payer: Self-pay | Admitting: Neurology

## 2022-12-14 ENCOUNTER — Ambulatory Visit: Payer: Medicare PPO | Attending: Cardiology | Admitting: Cardiology

## 2022-12-14 ENCOUNTER — Encounter: Payer: Self-pay | Admitting: Cardiology

## 2022-12-14 ENCOUNTER — Ambulatory Visit: Payer: Medicare PPO | Attending: Cardiology

## 2022-12-14 VITALS — BP 148/68 | HR 69 | Ht 63.0 in | Wt 134.0 lb

## 2022-12-14 DIAGNOSIS — E785 Hyperlipidemia, unspecified: Secondary | ICD-10-CM | POA: Diagnosis not present

## 2022-12-14 DIAGNOSIS — G2581 Restless legs syndrome: Secondary | ICD-10-CM | POA: Diagnosis not present

## 2022-12-14 DIAGNOSIS — I2583 Coronary atherosclerosis due to lipid rich plaque: Secondary | ICD-10-CM | POA: Diagnosis not present

## 2022-12-14 DIAGNOSIS — R55 Syncope and collapse: Secondary | ICD-10-CM | POA: Diagnosis not present

## 2022-12-14 DIAGNOSIS — I2510 Atherosclerotic heart disease of native coronary artery without angina pectoris: Secondary | ICD-10-CM

## 2022-12-14 DIAGNOSIS — I480 Paroxysmal atrial fibrillation: Secondary | ICD-10-CM

## 2022-12-14 DIAGNOSIS — R9389 Abnormal findings on diagnostic imaging of other specified body structures: Secondary | ICD-10-CM

## 2022-12-14 NOTE — Progress Notes (Signed)
Cardiology Office Note:    Date:  12/14/2022   ID:  Morgan [name], DOB [birth year], MRN [id number]  PCP:  Jinny Sanders, MD  [facility] Cardiologist:  Candee Furbish, MD  [facility] Electrophysiologist:  None   Referring MD: Jinny Sanders, MD    History of Present Illness:    Morgan [name] is a 82 y.o. female here for the follow-up of coronary artery disease, hypertension and atrial fibrillation. Post CABG in 2006 and diagonal stent after CABG.Cerebellar stroke in 2015. Angioedema right posterior neck melanoma discovered by Dr. Radford Pax. Nuclear stress test 2017 reassured. Had a fundoplication for her esophagus in March 2021. Prior shortness of breath attributed to deconditioning.  Echo had been reassured. Reported shortness of breath with incline and fatigue. She had poor balance and broke both arms due to fallen.   She also includes she can feel her palpitations that pounds and skip and have chest pains. She currently is taking Metoprolol Succinate 100 mg.   Today: After falls, MRI, CT scan, she saw [place] neurologist, Dr. Brigitte Pulse, EEG. Was thought to be sleeping in car, husband thought. Doesn't remember before or after. Passed out at [facility] door. Snapped out of it around 1pm. Suggest PT for balance.   Past Medical History:  Diagnosis Date   Angina    Arthritis    Atrial fibrillation ([place])    ASPIRIN FOR BLOOD THINNER   Atypical mole 09/22/2016   Bulging discs    lumbar    Complication of anesthesia    pt states has choking sensation with ET tube    Coronary artery disease    Depression    Diabetes mellitus    diet controlled/on meds   Family history of breast cancer    Family history of colon cancer    Fibromyalgia    GERD (gastroesophageal reflux disease)    H/O hiatal hernia    Headache(784.0)    "recurring"   High cholesterol    History of colon polyps    Hyperlipidemia    Hypertension    [place] esophagus    Melanoma ([place])    melanoma   Migraines    "til ~  1980"   PONV (postoperative nausea and vomiting)    Restless leg syndrome    Sciatic nerve pain    "from pinched nerve"   Sleep apnea    uses CPAP   Stroke ([place]) 2014   no deficits   Weakness of right side of body    "I've had PT for it; they don't know what it's from".  CORTISONE INJECTION INTO BACK 08/30/12    Past Surgical History:  Procedure Laterality Date   56 HOUR [place] STUDY N/A 12/29/2015   Procedure: 24 HOUR PH STUDY;  Surgeon: Manus Gunning, MD;  Location: [facility];  Service: Gastroenterology;  Laterality: N/A;   CARPAL TUNNEL RELEASE Left 07/02/2015   Procedure: CARPAL TUNNEL RELEASE;  Surgeon: Frederik Pear, MD;  Location: [place];  Service: Orthopedics;  Laterality: Left;   CATARACT EXTRACTION, BILATERAL Bilateral    Oct and Nov 2017   COLONOSCOPY  06/30/2019   CORONARY ANGIOPLASTY WITH STENT PLACEMENT  06/2011   "1"   CORONARY ARTERY BYPASS GRAFT  2005   CABG X 2   DILATION AND CURETTAGE OF UTERUS     "more than once"   ELBOW ARTHROSCOPY Left 07/02/2015   Procedure: ARTHROSCOPY LEFT ELBOW WITH DEBRIDEMENT AND REMOVAL LOOSE BODY;  Surgeon: Frederik Pear, MD;  Location: [facility]  [place];  Service: Orthopedics;  Laterality: Left;   ESOPHAGEAL DILATION  01/21/2020   Procedure: ESOPHAGEAL DILATION;  Surgeon: Lavena Bullion, DO;  [facility];  Service: Gastroenterology;;   ESOPHAGEAL MANOMETRY N/A 12/29/2015   Procedure: ESOPHAGEAL MANOMETRY (EM);  Surgeon: Manus Gunning, MD;  [facility];  Service: Gastroenterology;  Laterality: N/A;   ESOPHAGEAL MANOMETRY N/A 07/30/2019   Procedure: ESOPHAGEAL MANOMETRY (EM);  Surgeon: Yetta Flock, MD;  [facility];  Service: Gastroenterology;  Laterality: N/A;   ESOPHAGOGASTRODUODENOSCOPY (EGD) WITH PROPOFOL N/A 01/21/2020   Procedure: ESOPHAGOGASTRODUODENOSCOPY (EGD) WITH PROPOFOL;  Surgeon: Lavena Bullion, DO;  [facility];  Service:  Gastroenterology;  Laterality: N/A;   FRACTURE SURGERY  ~ 2005   nose   KNEE ARTHROSCOPY  09/04/2012   Procedure: ARTHROSCOPY KNEE;  Surgeon: Gearlean Alf, MD;  [facility];  Service: Orthopedics;  Laterality: Right;  right knee arthroscopy with medial and lateral meniscus debridement   MELANOMA EXCISION Right 10/13/2016   right side of neck   MOUTH SURGERY  2004   "bone replacement; had cadavear bones put in; face was collapsing"   NASAL SEPTUM SURGERY  ~ [place] Right 07/07/2013   Procedure: RIGHT TOTAL KNEE ARTHROPLASTY;  Surgeon: Gearlean Alf, MD;  [facility];  Service: Orthopedics;  Laterality: Right;   TRANSORAL INCISIONLESS FUNDOPLICATION N/A 3/50/0938   Procedure: TRANSORAL INCISIONLESS FUNDOPLICATION;  Surgeon: Lavena Bullion, DO;  [facility];  Service: Gastroenterology;  Laterality: N/A;   UPPER GASTROINTESTINAL ENDOSCOPY      Current Medications: Current Meds  Medication Sig   Alum & Mag Hydroxide-Simeth (MAALOX REGULAR STRENGTH PO) Take by mouth daily as needed. Patient takes 1 capful daily of the powder formulation.   aspirin EC 81 MG tablet Take 1 tablet (81 mg total) by mouth daily.   atorvastatin (LIPITOR) 80 MG tablet TAKE 1 TABLET BY MOUTH EVERY DAY   BLINK TEARS 0.25 % SOLN Place 1 drop into both eyes daily as needed (dry/irritated eyes.).   calcium-vitamin D (OSCAL WITH D) 500-200 MG-UNIT tablet Take 1 tablet by mouth 2 (two) times daily.   chlorthalidone (HYGROTON) 25 MG tablet TAKE 1 TABLET BY MOUTH EVERY DAY   CINNAMON PO Take 1,000 mg by mouth in the morning and at bedtime.   ferrous sulfate 325 (65 FE) MG tablet Take 325 mg by mouth daily.   gabapentin (NEURONTIN) 300 MG capsule TAKE 2 CAPSULES BY MOUTH AT BEDTIME (Patient taking differently: Can take up to 5 tabs daily)   metFORMIN (GLUCOPHAGE-XR) 500 MG 24 hr tablet TAKE 2 TABLETS BY MOUTH TWICE A DAY   methylcellulose (CITRUCEL) oral powder Use as  directed daily   metoprolol succinate (TOPROL-XL) 100 MG 24 hr tablet TAKE 1 TABLET BY MOUTH DAILY. TAKE WITH OR IMMEDIATELY FOLLOWING A MEAL.   Multiple Vitamins-Minerals (PRESERVISION AREDS PO) Take 1 tablet by mouth 2 (two) times daily. Reported on 12/23/2015   nitroGLYCERIN (NITROSTAT) 0.4 MG SL tablet PLACE 1 TABLET (0.4 MG TOTAL) UNDER THE TONGUE EVERY 5 (FIVE) MINUTES AS NEEDED FOR CHEST PAIN   ONETOUCH ULTRA test strip USE TO CHECK BLOOD SUGAR TWICE DAILY   Probiotic Product (ALIGN PO) Take by mouth at bedtime.   QUEtiapine (SEROQUEL) 25 MG tablet TAKE 1 TABLET BY MOUTH EVERYDAY AT BEDTIME   rOPINIRole (REQUIP) 1 MG tablet 1/2 tablet during the day, 2 tablets at bedtime   tolterodine (DETROL LA) 4 MG  24 hr capsule Take 1 capsule (4 mg total) by mouth daily.   venlafaxine XR (EFFEXOR-XR) 75 MG 24 hr capsule TAKE 1 CAPSULE BY MOUTH DAILY WITH BREAKFAST.   vitamin B-12 (CYANOCOBALAMIN) 1000 MCG tablet Take 1,000 mcg by mouth daily.     Allergies:   Hydrocodone-acetaminophen, Percocet [oxycodone-acetaminophen], and Sulfa antibiotics   Social History   Socioeconomic History   Marital status: Married    Spouse name: Not on file   Number of children: 2   Years of education: college   Highest education level: Not on file  Occupational History   Occupation: Retired   Tobacco Use   Smoking status: Never   Smokeless tobacco: Never  Vaping Use   Vaping Use: Never used  Substance and Sexual Activity   Alcohol use: Yes    Alcohol/week: 1.0 standard drink of alcohol    Types: 1 Glasses of wine per week    Comment: "occasionally drink wine"   Drug use: No   Sexual activity: Yes  Other Topics Concern   Not on file  Social History Narrative   Drinks 1 cup of coffee a day    Social Determinants of Health   Financial Resource Strain: Low Risk  (02/08/2021)   Overall Financial Resource Strain (CARDIA)    Difficulty of Paying Living Expenses: Not hard at all  Food Insecurity: No Food  Insecurity (02/08/2021)   Hunger Vital Sign    Worried About Running Out of Food in the Last Year: Never true    Ran Out of Food in the Last Year: Never true  Transportation Needs: No Transportation Needs (02/08/2021)   PRAPARE - Hydrologist (Medical): No    Lack of Transportation (Non-Medical): No  Physical Activity: Inactive (02/08/2021)   Exercise Vital Sign    Days of Exercise per Week: 0 days    Minutes of Exercise per Session: 0 min  Stress: No Stress Concern Present (02/08/2021)   [place]    Feeling of Stress : Not at all  Social Connections: Not on file     Family History: The patient's family history includes Breast cancer (age of onset: 26) in her mother; Cancer - Colon (age of onset: 84) in her sister; Colon cancer in her sister; Dementia in her sister; Diabetes in her sister; GER disease in her daughter; Heart disease in her brother; Heart disease (age of onset: 60) in her father; Hypertension in her brother, daughter, and sister. There is no history of Heart attack, Stroke, Esophageal cancer, Rectal cancer, or Stomach cancer.  ROS:   Please see the history of present illness.    (+) hallucinations  (+) restless legs (+) chest pains (+) word slurring  (+) palpitations all other systems reviewed and are negative.  EKGs/Labs/Other Studies Reviewed:    The following studies were reviewed today: LEXISCAN STRESS TEST 01/22:  Nuclear stress EF: 60%. There was no ST segment deviation noted during stress. The study is normal. The left ventricular ejection fraction is normal (55-65%). This is a low risk study.   Normal stress nuclear study with no ischemia or infarction.  Gated ejection fraction 60% with normal wall motion.   Monitor 08/04/2021   Sinus rhythm 62 bpm on average Brief atrial tachycardia episodes Rare PVC/PAC's No pauses, no atrial fibrillation, no  VT Reassuring  ECHO 01/22:  IMPRESSIONS:   1. Left ventricular ejection fraction, by estimation, is 55 to 60%. The  left ventricle has normal function. The left ventricle has no regional  wall motion abnormalities. There is mild left ventricular hypertrophy.  Left ventricular diastolic parameters  are consistent with Grade II diastolic dysfunction (pseudonormalization).   2. Right ventricular systolic function is mildly reduced. The right  ventricular size is normal. There is mildly elevated pulmonary artery  systolic pressure. The estimated right ventricular systolic pressure is  32.3 mmHg.   3. Left atrial size was mildly dilated.   4. The mitral valve is grossly normal. Mild mitral valve regurgitation.  No evidence of mitral stenosis.   5. The aortic valve is grossly normal. There is mild calcification of the  aortic valve. There is moderate thickening of the aortic valve. Aortic  valve regurgitation is not visualized. No aortic stenosis is present.   6. The inferior vena cava is normal in size with greater than 50%  respiratory variability, suggesting right atrial pressure of 3 mmHg.   Echo 5573 -EF 22% diastolic dysfunction with elevated left atrial filling pressures PA pressures 31, unchanged from prior  EKG:  09/22 : no EKG was ordered today. 01/22: sinus rhythm 64 artifact  Recent Labs: 02/07/2022: TSH 1.96 09/12/2022: BUN 31; Creatinine, Ser 0.92; Hemoglobin 11.6; Platelets 284.0; Potassium 3.9; Sodium 141 12/06/2022: ALT 19  Recent Lipid Panel    Component Value Date/Time   CHOL 150 12/06/2022 0822   TRIG 162 (H) 12/06/2022 0822   HDL 52 12/06/2022 0822   CHOLHDL 2.9 12/06/2022 0822   CHOLHDL 2 01/30/2022 0903   VLDL 12.4 01/30/2022 0903   LDLCALC 71 12/06/2022 0822       Physical Exam:    VS:  BP (!) 148/68   Pulse 69   Ht '5\' 3"'$  (1.6 m)   Wt 134 lb (60.8 kg)   SpO2 99%   BMI 23.74 kg/m     Wt Readings from Last 3 Encounters:  12/14/22 134 lb (60.8  kg)  09/12/22 134 lb 6 oz (61 kg)  09/05/22 134 lb 12.8 oz (61.1 kg)     GEN:  Well nourished, well developed in no acute distress HEENT: Normal NECK: No JVD; No carotid bruits LYMPHATICS: No lymphadenopathy CARDIAC: RRR, no murmurs, rubs, gallops RESPIRATORY:  Clear to auscultation without rales, wheezing or rhonchi  ABDOMEN: Soft, non-tender, non-distended MUSCULOSKELETAL:  No edema; No deformity  SKIN: Warm and dry NEUROLOGIC:  Alert and oriented x 3 PSYCHIATRIC:  Normal affect   ASSESSMENT:    1. Coronary artery disease due to lipid rich plaque   2. Hyperlipidemia LDL goal <70   3. RLS (restless legs syndrome)   4. Paroxysmal A-fib ([place])   5. Syncope and collapse      PLAN:     Syncope Unusual episodes.  Has had falls at home, had a period of time where she does not remember anything for several hours including a doctor's visit that she had.  This does not sound cardiac however I will go ahead and check an echocardiogram to ensure proper structure and function of her heart as well as a ZIO monitor once again to make sure that she is not having any adverse arrhythmias.  This will also give [place] some sense on her atrial fibrillation possible burden.  CAD (coronary artery disease) post CABG and D1 stent Nuclear stress test as well as echocardiogram reassuring.  No ischemia.  Normal pump function.  Performed in January 2022. After bypass had subsequent angina had diagonal stent placed.  RLS (restless legs syndrome) Continue  to work with primary team.  Was worried previously about coming off of clonazepam.  Paroxysmal A-fib ([place]) Is been years since we have seen any evidence of paroxysmal atrial fibrillation but now she is complaining of occasional palpitations racing heartbeat at night.  We will go ahead and check a Zio patch monitor again to ensure that she does not have any adverse arrhythmias.  Hyperlipidemia Currently excellent control with atorvastatin high intensity  dose 80 mg once a day for medical management.  No myalgias.  LDL last checked 59.  Wonderful.  ALT 19 from outside labs.  Hemoglobin 9.5 creatinine 0.96  Disequilibrium -Prior cerebellar stroke.  Physical therapy previously.  She has fallen unfortunately.  Broke her arm previously.  Being careful.  Neurologist suggested physical therapy for this again.  Agree.   Hyperlipidemia -Continue with high intensity dose of atorvastatin, LDL 47 excellent control.  No myalgias on atorvastatin.  No changes made.   Diastolic dysfunction with elevated left atrial filling pressures -Hydrochlorothiazide was added at one point then stopped subsequently. Now on chlorthalidone.   Continue with good blood pressure control.  Continue with exercise and conditioning efforts.  No changes made.  Slightly elevated today but willing to tolerate.   Diabetes with hypertension -Well-controlled.  Hemoglobin A1c 6.3.  Excellent management per Dr. Diona Browner   Obstructive sleep apnea -CPAP Dr. Radford Pax, stable.   GERD, polyps -Procedure 3/21-esophageal transoral fundoplication.  Doing well.  Lost weight.   We will follow-up with results of studies. Today's visit included extensive data review within epic as well as outside medical records.  Time spent 45 minutes in total.   Medication Adjustments/Labs and Tests Ordered: Current medicines are reviewed at length with the patient today.  Concerns regarding medicines are outlined above.  Orders Placed This Encounter  Procedures   LONG TERM MONITOR (3-14 DAYS)   ECHOCARDIOGRAM COMPLETE    No orders of the defined types were placed in this encounter.    Patient Instructions  Medication Instructions:  The current medical regimen is effective;  continue present plan and medications.  *If you need a refill on your cardiac medications before your next appointment, please call your pharmacy*  Testing/Procedures: Your physician has requested that you have an  echocardiogram. Echocardiography is a painless test that uses sound waves to create images of your heart. It provides your doctor with information about the size and shape of your heart and how well your heart's chambers and valves are working. This procedure takes approximately one hour. There are no restrictions for this procedure. Please do NOT wear cologne, perfume, aftershave, or lotions (deodorant is allowed). Please arrive 15 minutes prior to your appointment time.  ZIO XT- Long Term Monitor Instructions  Your physician has requested you wear a ZIO patch monitor for 14 days.  This is a single patch monitor. [employer] supplies one patch monitor per enrollment. Additional stickers are not available. Please do not apply patch if you will be having a Nuclear Stress Test,  Echocardiogram, Cardiac CT, MRI, or Chest Xray during the period you would be wearing the  monitor. The patch cannot be worn during these tests. You cannot remove and re-apply the  ZIO XT patch monitor.  Your ZIO patch monitor will be mailed 3 day USPS to your address on file. It may take 3-5 days  to receive your monitor after you have been enrolled.  Once you have received your monitor, please review the enclosed instructions. Your monitor  has already been registered assigning  a specific monitor serial # to you.  Billing and Patient Assistance Program Information  We have supplied [employer] with any of your insurance information on file for billing purposes. [employer] offers a sliding scale Patient Assistance Program for patients that do not have  insurance, or whose insurance does not completely cover the cost of the ZIO monitor.  You must apply for the Patient Assistance Program to qualify for this discounted rate.  To apply, please call [employer] at [phone], select option 4, select option 2, ask to apply for  Patient Assistance Program. Theodore Demark will ask your household income, and how many people  are in your  household. They will quote your out-of-pocket cost based on that information.  [employer] will also be able to set up a 13-month interest-free payment plan if needed.  Applying the monitor   Shave hair from upper left chest.  Hold abrader disc by orange tab. Rub abrader in 40 strokes over the upper left chest as  indicated in your monitor instructions.  Clean area with 4 enclosed alcohol pads. Let dry.  Apply patch as indicated in monitor instructions. Patch will be placed under collarbone on left  side of chest with arrow pointing upward.  Rub patch adhesive wings for 2 minutes. Remove white label marked "1". Remove the white  label marked "2". Rub patch adhesive wings for 2 additional minutes.  While looking in a mirror, press and release button in center of patch. A small green light will  flash 3-4 times. This will be your only indicator that the monitor has been turned on.  Do not shower for the first 24 hours. You may shower after the first 24 hours.  Press the button if you feel a symptom. You will hear a small click. Record Date, Time and  Symptom in the Patient Logbook.  When you are ready to remove the patch, follow instructions on the last 2 pages of Patient  Logbook. Stick patch monitor onto the last page of Patient Logbook.  Place Patient Logbook in the blue and white box. Use locking tab on box and tape box closed  securely. The blue and white box has prepaid postage on it. Please place it in the mailbox as  soon as possible. Your physician should have your test results approximately 7 days after the  monitor has been mailed back to [facility]  Call [place] [phone] you have questions regarding  your ZIO XT patch monitor. Call them immediately if you see an orange light blinking on your  monitor.  If your monitor falls off in less than 4 days, contact our Monitor department at [phone]  If your monitor becomes loose or falls off after  4 days call [employer] at [phone]  suggestions on securing your monitor  Follow-Up: At [facility] you and your health needs are our priority.  As part of our continuing mission to provide you with exceptional heart care, we have created designated Provider Care Teams.  These Care Teams include your primary Cardiologist (physician) and Advanced Practice Providers (APPs -  Physician Assistants and Nurse Practitioners) who all work together to provide you with the care you need, when you need it.  We recommend signing up for the patient portal called "MyChart".  Sign up information is provided on this After Visit Summary.  MyChart is used to connect with patients for Virtual Visits (Telemedicine).  Patients are able to view lab/test results, encounter notes, upcoming appointments, etc.  Non-urgent messages can be sent to your provider as well.   To learn more about what you can do with MyChart, go to [email or web address].    Your next appointment:   6 month(s)  Provider:   Nicholes Rough, PA-C            Signed, Candee Furbish, MD  12/14/2022 10:38 AM    [place]

## 2022-12-14 NOTE — Progress Notes (Unsigned)
Enrolled for [employer] to mail a ZIO XT long term holter monitor to the patients address on file.  

## 2022-12-14 NOTE — Patient Instructions (Signed)
Medication Instructions:  The current medical regimen is effective;  continue present plan and medications.  *If you need a refill on your cardiac medications before your next appointment, please call your pharmacy*  Testing/Procedures: Your physician has requested that you have an echocardiogram. Echocardiography is a painless test that uses sound waves to create images of your heart. It provides your doctor with information about the size and shape of your heart and how well your heart's chambers and valves are working. This procedure takes approximately one hour. There are no restrictions for this procedure. Please do NOT wear cologne, perfume, aftershave, or lotions (deodorant is allowed). Please arrive 15 minutes prior to your appointment time.  ZIO XT- Long Term Monitor Instructions  Your physician has requested you wear a ZIO patch monitor for 14 days.  This is a single patch monitor. [employer] supplies one patch monitor per enrollment. Additional stickers are not available. Please do not apply patch if you will be having a Nuclear Stress Test,  Echocardiogram, Cardiac CT, MRI, or Chest Xray during the period you would be wearing the  monitor. The patch cannot be worn during these tests. You cannot remove and re-apply the  ZIO XT patch monitor.  Your ZIO patch monitor will be mailed 3 day USPS to your address on file. It may take 3-5 days  to receive your monitor after you have been enrolled.  Once you have received your monitor, please review the enclosed instructions. Your monitor  has already been registered assigning a specific monitor serial # to you.  Billing and Patient Assistance Program Information  We have supplied [employer] with any of your insurance information on file for billing purposes. [employer] offers a sliding scale Patient Assistance Program for patients that do not have  insurance, or whose insurance does not completely cover the cost of the ZIO monitor.  You must  apply for the Patient Assistance Program to qualify for this discounted rate.  To apply, please call [employer] at [phone], select option 4, select option 2, ask to apply for  Patient Assistance Program. Theodore Demark will ask your household income, and how many people  are in your household. They will quote your out-of-pocket cost based on that information.  [employer] will also be able to set up a 52-month interest-free payment plan if needed.  Applying the monitor   Shave hair from upper left chest.  Hold abrader disc by orange tab. Rub abrader in 40 strokes over the upper left chest as  indicated in your monitor instructions.  Clean area with 4 enclosed alcohol pads. Let dry.  Apply patch as indicated in monitor instructions. Patch will be placed under collarbone on left  side of chest with arrow pointing upward.  Rub patch adhesive wings for 2 minutes. Remove white label marked "1". Remove the white  label marked "2". Rub patch adhesive wings for 2 additional minutes.  While looking in a mirror, press and release button in center of patch. A small green light will  flash 3-4 times. This will be your only indicator that the monitor has been turned on.  Do not shower for the first 24 hours. You may shower after the first 24 hours.  Press the button if you feel a symptom. You will hear a small click. Record Date, Time and  Symptom in the Patient Logbook.  When you are ready to remove the patch, follow instructions on the last 2 pages of Patient  Logbook. Stick patch monitor onto the last page  of Patient Logbook.  Place Patient Logbook in the blue and white box. Use locking tab on box and tape box closed  securely. The blue and white box has prepaid postage on it. Please place it in the mailbox as  soon as possible. Your physician should have your test results approximately 7 days after the  monitor has been mailed back to [facility].  Call [name] at [phone] if  you have questions regarding  your ZIO XT patch monitor. Call them immediately if you see an orange light blinking on your  monitor.  If your monitor falls off in less than 4 days, contact our Monitor department at [phone].  If your monitor becomes loose or falls off after 4 days call [employer] at [phone] for  suggestions on securing your monitor  Follow-Up: At [facility], you and your health needs are our priority.  As part of our continuing mission to provide you with exceptional heart care, we have created designated Provider Care Teams.  These Care Teams include your primary Cardiologist (physician) and Advanced Practice Providers (APPs -  Physician Assistants and Nurse Practitioners) who all work together to provide you with the care you need, when you need it.  We recommend signing up for the patient portal called "MyChart".  Sign up information is provided on this After Visit Summary.  MyChart is used to connect with patients for Virtual Visits (Telemedicine).  Patients are able to view lab/test results, encounter notes, upcoming appointments, etc.  Non-urgent messages can be sent to your provider as well.   To learn more about what you can do with MyChart, go to [email or web address].    Your next appointment:   6 month(s)  Provider:   Nicholes Rough, PA-C

## 2022-12-19 ENCOUNTER — Ambulatory Visit (HOSPITAL_COMMUNITY): Payer: Medicare PPO | Attending: Cardiology

## 2022-12-19 ENCOUNTER — Encounter: Payer: Self-pay | Admitting: Podiatry

## 2022-12-19 ENCOUNTER — Ambulatory Visit: Payer: Medicare PPO | Admitting: Podiatry

## 2022-12-19 VITALS — BP 179/69 | HR 67

## 2022-12-19 DIAGNOSIS — R55 Syncope and collapse: Secondary | ICD-10-CM

## 2022-12-19 DIAGNOSIS — M79675 Pain in left toe(s): Secondary | ICD-10-CM | POA: Diagnosis not present

## 2022-12-19 LAB — ECHOCARDIOGRAM COMPLETE
Area-P 1/2: 2.41 cm2
S' Lateral: 2.8 cm

## 2022-12-19 NOTE — Progress Notes (Signed)
Chief Complaint  Patient presents with   Nail Problem    "This toe that he has dealt with a few times is infected again."    HPI: 82 y.o. female presenting today for evaluation of left great toe pain.  The patient does have a history of partial nail matricectomy to the medial border of the left great toe performed 01/06/2022.  Patient states that recently the toe has become symptomatic and painful.  She says that it becomes red after taking her shoes off.  She presents for further treatment and evaluation  Past Medical History:  Diagnosis Date   Angina    Arthritis    Atrial fibrillation ([place])    ASPIRIN FOR BLOOD THINNER   Atypical mole 09/22/2016   Bulging discs    lumbar    Complication of anesthesia    pt states has choking sensation with ET tube    Coronary artery disease    Depression    Diabetes mellitus    diet controlled/on meds   Family history of breast cancer    Family history of colon cancer    Fibromyalgia    GERD (gastroesophageal reflux disease)    H/O hiatal hernia    Headache(784.0)    "recurring"   High cholesterol    History of colon polyps    Hyperlipidemia    Hypertension    [place] esophagus    Melanoma ([place])    melanoma   Migraines    "til ~ 1980"   PONV (postoperative nausea and vomiting)    Restless leg syndrome    Sciatic nerve pain    "from pinched nerve"   Sleep apnea    uses CPAP   Stroke ([place]) 2014   no deficits   Weakness of right side of body    "I've had PT for it; they don't know what it's from".  CORTISONE INJECTION INTO BACK 08/30/12    Past Surgical History:  Procedure Laterality Date   31 HOUR [place] STUDY N/A 12/29/2015   Procedure: 24 HOUR PH STUDY;  Surgeon: Manus Gunning, MD;  Location: [facility];  Service: Gastroenterology;  Laterality: N/A;   CARPAL TUNNEL RELEASE Left 07/02/2015   Procedure: CARPAL TUNNEL RELEASE;  Surgeon: Frederik Pear, MD;  Location: [place];  Service: Orthopedics;   Laterality: Left;   CATARACT EXTRACTION, BILATERAL Bilateral    Oct and Nov 2017   COLONOSCOPY  06/30/2019   CORONARY ANGIOPLASTY WITH STENT PLACEMENT  06/2011   "1"   CORONARY ARTERY BYPASS GRAFT  2005   CABG X 2   DILATION AND CURETTAGE OF UTERUS     "more than once"   ELBOW ARTHROSCOPY Left 07/02/2015   Procedure: ARTHROSCOPY LEFT ELBOW WITH DEBRIDEMENT AND REMOVAL LOOSE BODY;  Surgeon: Frederik Pear, MD;  Location: [place];  Service: Orthopedics;  Laterality: Left;   ESOPHAGEAL DILATION  01/21/2020   Procedure: ESOPHAGEAL DILATION;  Surgeon: Lavena Bullion, DO;  [facility];  Service: Gastroenterology;;   ESOPHAGEAL MANOMETRY N/A 12/29/2015   Procedure: ESOPHAGEAL MANOMETRY (EM);  Surgeon: Manus Gunning, MD;  [facility];  Service: Gastroenterology;  Laterality: N/A;   ESOPHAGEAL MANOMETRY N/A 07/30/2019   Procedure: ESOPHAGEAL MANOMETRY (EM);  Surgeon: Yetta Flock, MD;  [facility];  Service: Gastroenterology;  Laterality: N/A;   ESOPHAGOGASTRODUODENOSCOPY (EGD) WITH PROPOFOL N/A 01/21/2020   Procedure: ESOPHAGOGASTRODUODENOSCOPY (EGD) WITH PROPOFOL;  Surgeon: Lavena Bullion, DO;  [facility];  Service: Gastroenterology;  Laterality: N/A;   FRACTURE SURGERY  ~ 2005   nose   KNEE ARTHROSCOPY  09/04/2012   Procedure: ARTHROSCOPY KNEE;  Surgeon: Gearlean Alf, MD;  [facility]: [facility];  Service: Orthopedics;  Laterality: Right;  right knee arthroscopy with medial and lateral meniscus debridement   MELANOMA EXCISION Right 10/13/2016   right side of neck   MOUTH SURGERY  2004   "bone replacement; had cadavear bones put in; face was collapsing"   NASAL SEPTUM SURGERY  ~ [place] Right 07/07/2013   Procedure: RIGHT TOTAL KNEE ARTHROPLASTY;  Surgeon: Gearlean Alf, MD;  [facility]: [facility];  Service: Orthopedics;  Laterality: Right;   TRANSORAL INCISIONLESS FUNDOPLICATION N/A 3/42/8768    Procedure: TRANSORAL INCISIONLESS FUNDOPLICATION;  Surgeon: Lavena Bullion, DO;  [facility];  Service: Gastroenterology;  Laterality: N/A;   UPPER GASTROINTESTINAL ENDOSCOPY      Allergies  Allergen Reactions   Hydrocodone-Acetaminophen Other (See Comments)    "Changed personality" "made me very mean"   Percocet [Oxycodone-Acetaminophen] Other (See Comments)    hallucination   Sulfa Antibiotics Other (See Comments)    hallucinations     Physical Exam: General: The patient is alert and oriented x3 in no acute distress.  Dermatology: Skin is warm, dry and supple bilateral lower extremities. Negative for open lesions or macerations.  Clinical evidence of partial nail matricectomy to the medial and lateral border of the left hallux nail plate without any indication of recurrent ingrown or inflammation to the area.  Vascular: Palpable pedal pulses bilaterally. Capillary refill immediate.  No edema or erythema  Neurological: Grossly intact via light touch  Musculoskeletal Exam: No pedal deformities noted.  There is tenderness with palpation along the medial border of the left great toenail plate and distal tip of the toe  Assessment/plan of care: 1.  Pain in the left great toe -Patient evaluated.  No clinical evidence or indication of recurrent ingrown toenail to the medial border of the left great toe -The nail was debrided as well as some subungual debris to the medial border and the patient did feel some relief. -Silicone toe cap dispensed to wear to alleviate pressure and friction from the toe when wearing closed toed shoes -Return to clinic as needed      Edrick Kins, DPM [facility]  Dr. Edrick Kins, DPM    [address], [place] [address]                Office [phone]  Fax [phone]

## 2022-12-20 DIAGNOSIS — M65341 Trigger finger, right ring finger: Secondary | ICD-10-CM | POA: Diagnosis not present

## 2022-12-20 DIAGNOSIS — S6411XD Injury of median nerve at wrist and hand level of right arm, subsequent encounter: Secondary | ICD-10-CM | POA: Diagnosis not present

## 2022-12-20 DIAGNOSIS — M65351 Trigger finger, right little finger: Secondary | ICD-10-CM | POA: Diagnosis not present

## 2022-12-20 DIAGNOSIS — M65331 Trigger finger, right middle finger: Secondary | ICD-10-CM | POA: Diagnosis not present

## 2022-12-23 ENCOUNTER — Other Ambulatory Visit: Payer: Self-pay | Admitting: Family Medicine

## 2022-12-24 NOTE — Telephone Encounter (Signed)
Last office visit 09/12/22 for collapse/memory loss.  Last refilled 07/04/22 for #180 with 1 refill.  No future appointments with PCP.

## 2022-12-28 ENCOUNTER — Ambulatory Visit
Admission: RE | Admit: 2022-12-28 | Discharge: 2022-12-28 | Disposition: A | Discharge status: Home / Self Care | Payer: Medicare PPO | Source: Ambulatory Visit | Attending: Neurology | Admitting: Neurology

## 2022-12-28 DIAGNOSIS — R9389 Abnormal findings on diagnostic imaging of other specified body structures: Secondary | ICD-10-CM | POA: Diagnosis not present

## 2022-12-28 DIAGNOSIS — R930 Abnormal findings on diagnostic imaging of skull and head, not elsewhere classified: Secondary | ICD-10-CM | POA: Diagnosis not present

## 2022-12-28 DIAGNOSIS — G462 Posterior cerebral artery syndrome: Secondary | ICD-10-CM | POA: Diagnosis not present

## 2023-01-09 DIAGNOSIS — M65331 Trigger finger, right middle finger: Secondary | ICD-10-CM | POA: Diagnosis not present

## 2023-01-09 DIAGNOSIS — S6411XD Injury of median nerve at wrist and hand level of right arm, subsequent encounter: Secondary | ICD-10-CM | POA: Diagnosis not present

## 2023-01-09 DIAGNOSIS — M65351 Trigger finger, right little finger: Secondary | ICD-10-CM | POA: Diagnosis not present

## 2023-01-09 DIAGNOSIS — M65341 Trigger finger, right ring finger: Secondary | ICD-10-CM | POA: Diagnosis not present

## 2023-01-10 DIAGNOSIS — R55 Syncope and collapse: Secondary | ICD-10-CM | POA: Diagnosis not present

## 2023-01-16 DIAGNOSIS — M65331 Trigger finger, right middle finger: Secondary | ICD-10-CM | POA: Diagnosis not present

## 2023-01-16 DIAGNOSIS — M65341 Trigger finger, right ring finger: Secondary | ICD-10-CM | POA: Diagnosis not present

## 2023-01-16 DIAGNOSIS — M65351 Trigger finger, right little finger: Secondary | ICD-10-CM | POA: Diagnosis not present

## 2023-01-16 DIAGNOSIS — S6411XD Injury of median nerve at wrist and hand level of right arm, subsequent encounter: Secondary | ICD-10-CM | POA: Diagnosis not present

## 2023-01-18 ENCOUNTER — Other Ambulatory Visit: Payer: Self-pay | Admitting: Family Medicine

## 2023-01-18 DIAGNOSIS — M65341 Trigger finger, right ring finger: Secondary | ICD-10-CM | POA: Diagnosis not present

## 2023-01-18 DIAGNOSIS — S6411XD Injury of median nerve at wrist and hand level of right arm, subsequent encounter: Secondary | ICD-10-CM | POA: Diagnosis not present

## 2023-01-18 DIAGNOSIS — M65331 Trigger finger, right middle finger: Secondary | ICD-10-CM | POA: Diagnosis not present

## 2023-01-18 DIAGNOSIS — M65351 Trigger finger, right little finger: Secondary | ICD-10-CM | POA: Diagnosis not present

## 2023-01-18 NOTE — Telephone Encounter (Signed)
Patient has been scheduled

## 2023-01-18 NOTE — Telephone Encounter (Signed)
Please schedule CPE with fasting labs prior with Dr. Randa Spike for after 02/09/2023.  Already has [place] scheduled with nurse.

## 2023-01-23 ENCOUNTER — Telehealth: Payer: Self-pay | Admitting: *Deleted

## 2023-01-23 DIAGNOSIS — M65351 Trigger finger, right little finger: Secondary | ICD-10-CM | POA: Diagnosis not present

## 2023-01-23 DIAGNOSIS — S6411XD Injury of median nerve at wrist and hand level of right arm, subsequent encounter: Secondary | ICD-10-CM | POA: Diagnosis not present

## 2023-01-23 DIAGNOSIS — M65341 Trigger finger, right ring finger: Secondary | ICD-10-CM | POA: Diagnosis not present

## 2023-01-23 DIAGNOSIS — M65331 Trigger finger, right middle finger: Secondary | ICD-10-CM | POA: Diagnosis not present

## 2023-01-23 DIAGNOSIS — G5601 Carpal tunnel syndrome, right upper limb: Secondary | ICD-10-CM | POA: Diagnosis not present

## 2023-01-23 NOTE — Telephone Encounter (Signed)
That would be fine.. can do at upcoming appt. Cancel lab appt.

## 2023-01-23 NOTE — Telephone Encounter (Signed)
Looks like patient has had recent labs with other providers.  I think she just needs a Hgb A1c and Microalbumin for her upcoming CPE which we could do the day of her CPE.  Please advise.

## 2023-01-23 NOTE — Telephone Encounter (Signed)
-----   Message from Ellamae Sia sent at 01/23/2023  9:51 AM EDT ----- Regarding: Lab orders for Thursday, 3.28.24 Patient is scheduled for CPX labs, please order future labs, Thanks , Karna Christmas

## 2023-01-24 NOTE — Telephone Encounter (Signed)
Spoke with Mrs. Highfill.  Lab appointment cancelled.  Will do POC A1c and MALBU at CPE visit.

## 2023-01-29 DIAGNOSIS — R4189 Other symptoms and signs involving cognitive functions and awareness: Secondary | ICD-10-CM | POA: Diagnosis not present

## 2023-01-29 DIAGNOSIS — I69998 Other sequelae following unspecified cerebrovascular disease: Secondary | ICD-10-CM | POA: Diagnosis not present

## 2023-01-29 DIAGNOSIS — Z87820 Personal history of traumatic brain injury: Secondary | ICD-10-CM | POA: Diagnosis not present

## 2023-01-29 DIAGNOSIS — G2581 Restless legs syndrome: Secondary | ICD-10-CM | POA: Diagnosis not present

## 2023-01-29 DIAGNOSIS — R531 Weakness: Secondary | ICD-10-CM | POA: Diagnosis not present

## 2023-01-29 DIAGNOSIS — R55 Syncope and collapse: Secondary | ICD-10-CM | POA: Diagnosis not present

## 2023-01-29 DIAGNOSIS — R269 Unspecified abnormalities of gait and mobility: Secondary | ICD-10-CM | POA: Diagnosis not present

## 2023-01-29 DIAGNOSIS — Z8669 Personal history of other diseases of the nervous system and sense organs: Secondary | ICD-10-CM | POA: Diagnosis not present

## 2023-01-30 DIAGNOSIS — R55 Syncope and collapse: Secondary | ICD-10-CM | POA: Diagnosis not present

## 2023-02-06 DIAGNOSIS — M65331 Trigger finger, right middle finger: Secondary | ICD-10-CM | POA: Diagnosis not present

## 2023-02-06 DIAGNOSIS — M65341 Trigger finger, right ring finger: Secondary | ICD-10-CM | POA: Diagnosis not present

## 2023-02-06 DIAGNOSIS — M65351 Trigger finger, right little finger: Secondary | ICD-10-CM | POA: Diagnosis not present

## 2023-02-06 DIAGNOSIS — S6411XD Injury of median nerve at wrist and hand level of right arm, subsequent encounter: Secondary | ICD-10-CM | POA: Diagnosis not present

## 2023-02-08 ENCOUNTER — Other Ambulatory Visit: Payer: Medicare PPO

## 2023-02-09 ENCOUNTER — Ambulatory Visit: Payer: Medicare PPO

## 2023-02-10 ENCOUNTER — Other Ambulatory Visit: Payer: Self-pay | Admitting: Family Medicine

## 2023-02-15 ENCOUNTER — Encounter: Payer: Medicare PPO | Admitting: Family Medicine

## 2023-02-20 ENCOUNTER — Other Ambulatory Visit: Payer: Self-pay | Admitting: Family Medicine

## 2023-02-21 ENCOUNTER — Ambulatory Visit (INDEPENDENT_AMBULATORY_CARE_PROVIDER_SITE_OTHER): Payer: Medicare PPO | Admitting: Family Medicine

## 2023-02-21 ENCOUNTER — Encounter: Payer: Self-pay | Admitting: Family Medicine

## 2023-02-21 VITALS — BP 116/70 | HR 66 | Temp 96.0°F | Ht 61.5 in | Wt 134.0 lb

## 2023-02-21 DIAGNOSIS — I152 Hypertension secondary to endocrine disorders: Secondary | ICD-10-CM

## 2023-02-21 DIAGNOSIS — E785 Hyperlipidemia, unspecified: Secondary | ICD-10-CM | POA: Diagnosis not present

## 2023-02-21 DIAGNOSIS — G2581 Restless legs syndrome: Secondary | ICD-10-CM

## 2023-02-21 DIAGNOSIS — F331 Major depressive disorder, recurrent, moderate: Secondary | ICD-10-CM | POA: Diagnosis not present

## 2023-02-21 DIAGNOSIS — Z0000 Encounter for general adult medical examination without abnormal findings: Secondary | ICD-10-CM

## 2023-02-21 DIAGNOSIS — N3941 Urge incontinence: Secondary | ICD-10-CM

## 2023-02-21 DIAGNOSIS — F411 Generalized anxiety disorder: Secondary | ICD-10-CM | POA: Insufficient documentation

## 2023-02-21 DIAGNOSIS — E1169 Type 2 diabetes mellitus with other specified complication: Secondary | ICD-10-CM

## 2023-02-21 DIAGNOSIS — E1159 Type 2 diabetes mellitus with other circulatory complications: Secondary | ICD-10-CM | POA: Diagnosis not present

## 2023-02-21 LAB — MICROALBUMIN / CREATININE URINE RATIO
Creatinine,U: 58.1 mg/dL
Microalb Creat Ratio: 18.3 mg/g (ref 0.0–30.0)
Microalb, Ur: 10.6 mg/dL — ABNORMAL HIGH (ref 0.0–1.9)

## 2023-02-21 LAB — HEMOGLOBIN A1C: Hgb A1c MFr Bld: 7.7 % — ABNORMAL HIGH (ref 4.6–6.5)

## 2023-02-21 LAB — HM DIABETES FOOT EXAM

## 2023-02-21 NOTE — Assessment & Plan Note (Signed)
Chronic, stable control on gabapentin 600 mg and Requip

## 2023-02-21 NOTE — Progress Notes (Signed)
25 mg daily   Patient ID: Morgan [name], female    DOB: [birth year], 82 y.o.   MRN: [id number]  This visit was conducted in person.  BP 116/70 (BP Location: Left Arm, Patient Position: Sitting, Cuff Size: Normal)   Pulse 66   Temp (!) 96 F (35.6 C) (Temporal)   Ht 5' 1.5" (1.562 m)   Wt 134 lb (60.8 kg)   SpO2 94%   BMI 24.91 kg/m    CC:  Chief Complaint  Patient presents with   Medicare Wellness    Part 2; part 1 will be done with wellness nurse on 02/28/23    Subjective:   HPI: Morgan [name] is a 82 y.o. female presenting on 02/21/2023 for Medicare Wellness (Part 2; part 1 will be done with wellness nurse on 02/28/23)  The patient presents for annual medicare wellness, complete physical and review of chronic health problems. He/She also has the following acute concerns today: None    She is now followed by Dr. Sherryll Burger for recurrent syncope  Reviewed OV note from 11/2022 MRI findings were stable. MRA head and neck negative. EEG did not show any epileptiform discharges. Cardiac workup, including echocardiogram and Holter monitor, has been completed.   Followed by Dr. Anne [facility]. 12/14/2022 OV note reviewed.  Paroxsysmal Afib, hx of CAD   Referred to [facility] at [facility]  for gait instability.   Flowsheet Row Office Visit from 08/22/2022 in [facility]  PHQ-2 Total Score 0      Hypertension:    Well controlled on  chlorthalidone, metoprolol. BP Readings from Last 3 Encounters:  02/21/23 116/70  12/19/22 (!) 179/69  12/14/22 (!) 148/68  Using medication without problems or lightheadedness:  none Chest pain with exertion: none Edema: none Short of breath:  occ Average home BPs: Other issues:  Diabetes:     Due for labs  On metformin  1000 mg BID, now off victoza ( given diarrhea, now much better)  Lab Results  Component Value Date   HGBA1C 7.0 (A) 08/22/2022  Using medications without difficulties: Hypoglycemic  episodes: Hyperglycemic episodes: Feet problems: none Blood Sugars averaging: eye exam within last year:  occ. Due for foot exam, and microalbumin urine test  Elevated Cholesterol:  LDL at goal on atorvastatin 80 mg daily Lab Results  Component Value Date   CHOL 150 12/06/2022   HDL 52 12/06/2022   LDLCALC 71 12/06/2022   TRIG 162 (H) 12/06/2022   CHOLHDL 2.9 12/06/2022  Using medications without problems: Muscle aches:  Diet compliance: healthy Exercise: minimal. Other complaints:   Wt Readings from Last 3 Encounters:  02/21/23 134 lb (60.8 kg)  12/14/22 134 lb (60.8 kg)  09/12/22 134 lb 6 oz (61 kg)    Iron deficiency anemia resolve on iron.  Malignant melanoma: followed by Derm every 6 months.   Urge incontinence:   Detrol LA   MDD, GAD: On venlafaxine 75 mg daily.   Increased anxiety   PHQ9: 7 GAD7 9  Restless leg syndrome: gabapentin 600 mg at night. Using half tablet ropinerole during the day and 2 tablets at bedtime. This is well controlled at night  Relevant past medical, surgical, family and social history reviewed and updated as indicated. Interim medical history since our last visit reviewed. Allergies and medications reviewed and updated. Outpatient Medications Prior to Visit  Medication Sig Dispense Refill   Alum & Mag Hydroxide-Simeth (MAALOX REGULAR STRENGTH PO) Take by mouth daily as needed. Patient  takes 1 capful daily of the powder formulation.     aspirin EC 81 MG tablet Take 1 tablet (81 mg total) by mouth daily.     atorvastatin (LIPITOR) 80 MG tablet TAKE 1 TABLET BY MOUTH EVERY DAY 90 tablet 3   BLINK TEARS 0.25 % SOLN Place 1 drop into both eyes daily as needed (dry/irritated eyes.).     calcium-vitamin D (OSCAL WITH D) 500-200 MG-UNIT tablet Take 1 tablet by mouth 2 (two) times daily.     chlorthalidone (HYGROTON) 25 MG tablet TAKE 1 TABLET BY MOUTH EVERY DAY 90 tablet 3   CINNAMON PO Take 1,000 mg by mouth in the morning and at bedtime.      ferrous sulfate 325 (65 FE) MG tablet Take 325 mg by mouth daily.     gabapentin (NEURONTIN) 300 MG capsule TAKE 2 CAPSULES BY MOUTH AT BEDTIME 180 capsule 1   Lancet Device MISC Use to check blood sugar twice daily. 1 each 0   Lancets (FREESTYLE) lancets Use to check blood sugar twice daily 100 each 11   metFORMIN (GLUCOPHAGE-XR) 500 MG 24 hr tablet TAKE 2 TABLETS BY MOUTH TWICE A DAY 360 tablet 3   methylcellulose (CITRUCEL) oral powder Use as directed daily     metoprolol succinate (TOPROL-XL) 100 MG 24 hr tablet TAKE 1 TABLET BY MOUTH EVERY DAY WITH OR IMMEDIATELY FOLLOWING A MEAL 90 tablet 3   Multiple Vitamins-Minerals (PRESERVISION AREDS PO) Take 1 tablet by mouth 2 (two) times daily. Reported on 12/23/2015     nitroGLYCERIN (NITROSTAT) 0.4 MG SL tablet PLACE 1 TABLET (0.4 MG TOTAL) UNDER THE TONGUE EVERY 5 (FIVE) MINUTES AS NEEDED FOR CHEST PAIN 25 tablet 3   ONETOUCH ULTRA test strip USE TO CHECK BLOOD SUGAR TWICE DAILY 150 strip 3   Probiotic Product (ALIGN PO) Take by mouth at bedtime.     QUEtiapine (SEROQUEL) 25 MG tablet TAKE 1 TABLET BY MOUTH EVERYDAY AT BEDTIME 90 tablet 1   rOPINIRole (REQUIP) 1 MG tablet 1/2 tablet during the day, 2 tablets at bedtime 90 tablet 11   tolterodine (DETROL LA) 4 MG 24 hr capsule Take 1 capsule (4 mg total) by mouth daily. (Patient taking differently: Take 4 mg by mouth daily. Takes 2 capsules by mouth daily) 30 capsule 11   venlafaxine XR (EFFEXOR-XR) 75 MG 24 hr capsule TAKE 1 CAPSULE BY MOUTH DAILY WITH BREAKFAST. 90 capsule 1   vitamin B-12 (CYANOCOBALAMIN) 1000 MCG tablet Take 1,000 mcg by mouth daily.     No facility-administered medications prior to visit.     Per HPI unless specifically indicated in ROS section below Review of Systems  Constitutional:  Negative for fatigue and fever.  HENT:  Negative for congestion.   Eyes:  Negative for pain.  Respiratory:  Negative for cough and shortness of breath.   Cardiovascular:  Negative for chest  pain, palpitations and leg swelling.  Gastrointestinal:  Negative for abdominal pain.  Genitourinary:  Negative for dysuria and vaginal bleeding.  Musculoskeletal:  Negative for back pain.  Neurological:  Negative for syncope, light-headedness and headaches.  Psychiatric/Behavioral:  Positive for agitation and sleep disturbance. Negative for confusion, decreased concentration, dysphoric mood and suicidal ideas. The patient is nervous/anxious and is hyperactive.    Objective:  BP 116/70 (BP Location: Left Arm, Patient Position: Sitting, Cuff Size: Normal)   Pulse 66   Temp (!) 96 F (35.6 C) (Temporal)   Ht 5' 1.5" (1.562 m)   Wt 134  lb (60.8 kg)   SpO2 94%   BMI 24.91 kg/m   Wt Readings from Last 3 Encounters:  02/21/23 134 lb (60.8 kg)  12/14/22 134 lb (60.8 kg)  09/12/22 134 lb 6 oz (61 kg)      Physical Exam Vitals and nursing note reviewed.  Constitutional:      General: She is not in acute distress.    Appearance: Normal appearance. She is well-developed. She is not ill-appearing or toxic-appearing.  HENT:     Head: Normocephalic.     Right Ear: Hearing, tympanic membrane, ear canal and external ear normal.     Left Ear: Hearing, tympanic membrane, ear canal and external ear normal.     Nose: Nose normal.  Eyes:     General: Lids are normal. Lids are everted, no foreign bodies appreciated.     Conjunctiva/sclera: Conjunctivae normal.     Pupils: Pupils are equal, round, and reactive to light.  Neck:     Thyroid: No thyroid mass or thyromegaly.     Vascular: No carotid bruit.     Trachea: Trachea normal.  Cardiovascular:     Rate and Rhythm: Normal rate and regular rhythm.     Heart sounds: Normal heart sounds, S1 normal and S2 normal. No murmur heard.    No gallop.  Pulmonary:     Effort: Pulmonary effort is normal. No respiratory distress.     Breath sounds: Normal breath sounds. No wheezing, rhonchi or rales.  Abdominal:     General: Bowel sounds are normal.  There is no distension or abdominal bruit.     Palpations: Abdomen is soft. There is no fluid wave or mass.     Tenderness: There is no abdominal tenderness. There is no guarding or rebound.     Hernia: No hernia is present.  Musculoskeletal:     Cervical back: Normal range of motion and neck supple.  Lymphadenopathy:     Cervical: No cervical adenopathy.  Skin:    General: Skin is warm and dry.     Findings: No rash.  Neurological:     Mental Status: She is alert.     Cranial Nerves: No cranial nerve deficit.     Sensory: No sensory deficit.  Psychiatric:        Mood and Affect: Mood is not anxious or depressed.        Speech: Speech normal.        Behavior: Behavior normal. Behavior is cooperative.        Judgment: Judgment normal.       Diabetic foot exam: Normal inspection No skin breakdown No calluses  Normal DP pulses Normal sensation to light touch and monofilament Nails normal  Results for orders placed or performed in visit on 02/21/23  HM DIABETES FOOT EXAM  Result Value Ref Range   HM Diabetic Foot Exam done    *Note: Due to a large number of results and/or encounters for the requested time period, some results have not been displayed. A complete set of results can be found in Results Review.    This visit occurred during the SARS-CoV-2 public health emergency.  Safety protocols were in place, including screening questions prior to the visit, additional usage of staff PPE, and extensive cleaning of exam room while observing appropriate contact time as indicated for disinfecting solutions.   COVID 19 screen:  No recent travel or known exposure to COVID19 The patient denies respiratory symptoms of COVID 19 at this time. The  importance of social distancing was discussed today.   Assessment and Plan The patient's preventative maintenance and recommended screening tests for an annual wellness exam were reviewed in full today. Brought up to date unless services  declined.  Counselled on the importance of diet, exercise, and its role in overall health and mortality. The patient's FH and SH was reviewed, including their home life, tobacco status, and drug and alcohol status.   Vaccines: uptodate, COVID x 3, plans 4th booster. Pap/DVE:  No pap, DVE not indicated... Asymptomatic. No family history of uterine or ovarian cancer. Mammo: 05/2021, no yearly breast exam. Mother with breast cancer. Bone Density: 06/2019 stable osteopenia, repeat 5 years Colon:08/2020 Sister with strong family history colon cancer,  neg Cologuard 2023 Smoking Status:None  Problem List Items Addressed This Visit     GAD (generalized anxiety disorder)   Relevant Orders   Ambulatory referral to Psychology   Hyperlipidemia associated with type 2 diabetes mellitus (Chronic)    Chronic, stable at last check.  Continue current medication.  Atorvastatin 80 mg daily       Hypertension associated with diabetes (Chronic)    Well controlled on metoprolol XL 100 mg p.o. daily  and chlorthalidone 25 mg daily      MDD (major depressive disorder), recurrent episode, moderate (Chronic)    Chronic,  MODERATE control  More anxiety with husbands health issues.  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       Relevant Orders   Hemoglobin A1c   Microalbumin / creatinine urine ratio   Urge incontinence    Chronic, poor  control  despite 2 capsules daily of Detrol LA  No benefit from Myrbetriq      Relevant Orders   Ambulatory  referral to Urology   Other Visit Diagnoses     Routine general medical examination at a health care facility    -  Primary        Kerby NoraAmy Jaymee Tilson, MD

## 2023-02-21 NOTE — Assessment & Plan Note (Signed)
Chronic, improving control  Continue metformin XR 500 mg 2 tablets twice daily Encouraged exercise, weight loss, healthy eating habits.

## 2023-02-21 NOTE — Assessment & Plan Note (Signed)
Chronic, stable at last check.  Continue current medication.  Atorvastatin 80 mg daily

## 2023-02-21 NOTE — Assessment & Plan Note (Signed)
Well controlled on metoprolol XL 100 mg p.o. daily  and chlorthalidone 25 mg daily

## 2023-02-21 NOTE — Patient Instructions (Addendum)
Call  with the name of eye doctor so we can request records of 09/2022. Referral  to urology placed... stop Detrol LA.  Please stop at the lab to have labs drawn.  Referral to counselor palced.. call to set up.  Please call the location of your choice from the menu below to schedule your Mammogram and/or Bone Density appointment.      [name]   Phone:  [phone]   [address]                                                                            [place], [place] [address]                                            Services: 3D Mammogram and Bone [facility] at [facility])  Phone:  [phone]   [address]                        [place], [place] [address]                                              Services:  3D Mammogram and Bone Density

## 2023-02-21 NOTE — Assessment & Plan Note (Addendum)
Chronic, poor  control  despite 2 capsules daily of Detrol LA  No benefit from [employer]

## 2023-02-21 NOTE — Assessment & Plan Note (Addendum)
Chronic,  MODERATE control  More anxiety with husbands health issues.  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   Venlafaxine 75 mg daily

## 2023-02-23 DIAGNOSIS — M65351 Trigger finger, right little finger: Secondary | ICD-10-CM | POA: Diagnosis not present

## 2023-02-23 DIAGNOSIS — S6411XD Injury of median nerve at wrist and hand level of right arm, subsequent encounter: Secondary | ICD-10-CM | POA: Diagnosis not present

## 2023-02-23 DIAGNOSIS — M65341 Trigger finger, right ring finger: Secondary | ICD-10-CM | POA: Diagnosis not present

## 2023-02-23 DIAGNOSIS — M65331 Trigger finger, right middle finger: Secondary | ICD-10-CM | POA: Diagnosis not present

## 2023-02-28 ENCOUNTER — Ambulatory Visit (INDEPENDENT_AMBULATORY_CARE_PROVIDER_SITE_OTHER): Payer: Medicare PPO

## 2023-02-28 VITALS — Ht 63.0 in | Wt 133.0 lb

## 2023-02-28 DIAGNOSIS — R269 Unspecified abnormalities of gait and mobility: Secondary | ICD-10-CM | POA: Diagnosis not present

## 2023-02-28 DIAGNOSIS — Z780 Asymptomatic menopausal state: Secondary | ICD-10-CM

## 2023-02-28 DIAGNOSIS — Z0000 Encounter for general adult medical examination without abnormal findings: Secondary | ICD-10-CM

## 2023-02-28 NOTE — Patient Instructions (Signed)
[name] , Thank you for taking time to come for your Medicare Wellness Visit. I appreciate your ongoing commitment to your health goals. Please review the following plan we discussed and let me know if I can assist you in the future.   These are the goals we discussed:  Goals      Increase physical activity     As scheduled, I will continue to attend physical therapy for 45 minutes 2 days to per week. Additionally, I will continue to do PT exercises for 30 minutes daily.      Patient Stated     01/19/2020, I will try to increase the amount of exercise to about 2-3 days a week.      Patient Stated     02/08/2021, I will maintain and continue medications as prescribed.      Patient Stated     Exercise more.        This is a list of the screening recommended for you and due dates:  Health Maintenance  Topic Date Due   Eye exam for diabetics  10/13/2021   COVID-19 Vaccine (8 - 2023-24 season) 03/09/2023*   Mammogram  05/20/2023   Flu Shot  06/14/2023   Hemoglobin A1C  08/23/2023   Yearly kidney function blood test for diabetes  09/13/2023   Yearly kidney health urinalysis for diabetes  02/21/2024   Complete foot exam   02/21/2024   Medicare Annual Wellness Visit  02/28/2024   DTaP/Tdap/Td vaccine (3 - Td or Tdap) 06/25/2028   Pneumonia Vaccine  Completed   DEXA scan (bone density measurement)  Completed   Zoster (Shingles) Vaccine  Completed   HPV Vaccine  Aged Out   Colon Cancer Screening  Discontinued  *Topic was postponed. The date shown is not the original due date.    Advanced directives: Please bring a copy of your health care power of attorney and living will to the office to be added to your chart at your convenience.   Conditions/risks identified: Aim for 30 minutes of exercise or brisk walking, 6-8 glasses of water, and 5 servings of fruits and vegetables each day.   Next appointment: Follow up in one year for your annual wellness visit 03/03/24  @ 10:00  televisit.   Preventive Care 43 Years and Older, Female Preventive care refers to lifestyle choices and visits with your health care provider that can promote health and wellness. What does preventive care include? A yearly physical exam. This is also called an annual well check. Dental exams once or twice a year. Routine eye exams. Ask your health care provider how often you should have your eyes checked. Personal lifestyle choices, including: Daily care of your teeth and gums. Regular physical activity. Eating a healthy diet. Avoiding tobacco and drug use. Limiting alcohol use. Practicing safe sex. Taking low-dose aspirin every day. Taking vitamin and mineral supplements as recommended by your health care provider. What happens during an annual well check? The services and screenings done by your health care provider during your annual well check will depend on your age, overall health, lifestyle risk factors, and family history of disease. Counseling  Your health care provider may ask you questions about your: Alcohol use. Tobacco use. Drug use. Emotional well-being. Home and relationship well-being. Sexual activity. Eating habits. History of falls. Memory and ability to understand (cognition). Work and work Astronomer. Reproductive health. Screening  You may have the following tests or measurements: Height, weight, and BMI. Blood pressure. Lipid and  cholesterol levels. These may be checked every 5 years, or more frequently if you are over 53 years old. Skin check. Lung cancer screening. You may have this screening every year starting at age 73 if you have a 30-pack-year history of smoking and currently smoke or have quit within the past 15 years. Fecal occult blood test (FOBT) of the stool. You may have this test every year starting at age 50. Flexible sigmoidoscopy or colonoscopy. You may have a sigmoidoscopy every 5 years or a colonoscopy every 10 years starting at age  52. Hepatitis C blood test. Hepatitis B blood test. Sexually transmitted disease (STD) testing. Diabetes screening. This is done by checking your blood sugar (glucose) after you have not eaten for a while (fasting). You may have this done every 1-3 years. Bone density scan. This is done to screen for osteoporosis. You may have this done starting at age 71. Mammogram. This may be done every 1-2 years. Talk to your health care provider about how often you should have regular mammograms. Talk with your health care provider about your test results, treatment options, and if necessary, the need for more tests. Vaccines  Your health care provider may recommend certain vaccines, such as: Influenza vaccine. This is recommended every year. Tetanus, diphtheria, and acellular pertussis (Tdap, Td) vaccine. You may need a Td booster every 10 years. Zoster vaccine. You may need this after age 77. Pneumococcal 13-valent conjugate (PCV13) vaccine. One dose is recommended after age 18. Pneumococcal polysaccharide (PPSV23) vaccine. One dose is recommended after age 34. Talk to your health care provider about which screenings and vaccines you need and how often you need them. This information is not intended to replace advice given to you by your health care provider. Make sure you discuss any questions you have with your health care provider. Document Released: 11/26/2015 Document Revised: 07/19/2016 Document Reviewed: 08/31/2015 Elsevier Interactive Patient Education  2017 [employer].  Fall Prevention in the Home Falls can cause injuries. They can happen to people of all ages. There are many things you can do to make your home safe and to help prevent falls. What can I do on the outside of my home? Regularly fix the edges of walkways and driveways and fix any cracks. Remove anything that might make you trip as you walk through a door, such as a raised step or threshold. Trim any bushes or trees on the  path to your home. Use bright outdoor lighting. Clear any walking paths of anything that might make someone trip, such as rocks or tools. Regularly check to see if handrails are loose or broken. Make sure that both sides of any steps have handrails. Any raised decks and porches should have guardrails on the edges. Have any leaves, snow, or ice cleared regularly. Use sand or salt on walking paths during winter. Clean up any spills in your garage right away. This includes oil or grease spills. What can I do in the bathroom? Use night lights. Install grab bars by the toilet and in the tub and shower. Do not use towel bars as grab bars. Use non-skid mats or decals in the tub or shower. If you need to sit down in the shower, use a plastic, non-slip stool. Keep the floor dry. Clean up any water that spills on the floor as soon as it happens. Remove soap buildup in the tub or shower regularly. Attach bath mats securely with double-sided non-slip rug tape. Do not have throw rugs and other  things on the floor that can make you trip. What can I do in the bedroom? Use night lights. Make sure that you have a light by your bed that is easy to reach. Do not use any sheets or blankets that are too big for your bed. They should not hang down onto the floor. Have a firm chair that has side arms. You can use this for support while you get dressed. Do not have throw rugs and other things on the floor that can make you trip. What can I do in the kitchen? Clean up any spills right away. Avoid walking on wet floors. Keep items that you use a lot in easy-to-reach places. If you need to reach something above you, use a strong step stool that has a grab bar. Keep electrical cords out of the way. Do not use floor polish or wax that makes floors slippery. If you must use wax, use non-skid floor wax. Do not have throw rugs and other things on the floor that can make you trip. What can I do with my stairs? Do not  leave any items on the stairs. Make sure that there are handrails on both sides of the stairs and use them. Fix handrails that are broken or loose. Make sure that handrails are as long as the stairways. Check any carpeting to make sure that it is firmly attached to the stairs. Fix any carpet that is loose or worn. Avoid having throw rugs at the top or bottom of the stairs. If you do have throw rugs, attach them to the floor with carpet tape. Make sure that you have a light switch at the top of the stairs and the bottom of the stairs. If you do not have them, ask someone to add them for you. What else can I do to help prevent falls? Wear shoes that: Do not have high heels. Have rubber bottoms. Are comfortable and fit you well. Are closed at the toe. Do not wear sandals. If you use a stepladder: Make sure that it is fully opened. Do not climb a closed stepladder. Make sure that both sides of the stepladder are locked into place. Ask someone to hold it for you, if possible. Clearly mark and make sure that you can see: Any grab bars or handrails. First and last steps. Where the edge of each step is. Use tools that help you move around (mobility aids) if they are needed. These include: Canes. Walkers. Scooters. Crutches. Turn on the lights when you go into a dark area. Replace any light bulbs as soon as they burn out. Set up your furniture so you have a clear path. Avoid moving your furniture around. If any of your floors are uneven, fix them. If there are any pets around you, be aware of where they are. Review your medicines with your doctor. Some medicines can make you feel dizzy. This can increase your chance of falling. Ask your doctor what other things that you can do to help prevent falls. This information is not intended to replace advice given to you by your health care provider. Make sure you discuss any questions you have with your health care provider. Document Released:  08/26/2009 Document Revised: 04/06/2016 Document Reviewed: 12/04/2014 Elsevier Interactive Patient Education  2017 [employer].

## 2023-02-28 NOTE — Progress Notes (Signed)
I connected with  Morgan [name] on 02/28/23 by a audio enabled telemedicine application and verified that I am speaking with the correct person using two identifiers.  Patient Location: Home  Provider Location: Office/Clinic  I discussed the limitations of evaluation and management by telemedicine. The patient expressed understanding and agreed to proceed.  Subjective:   Morgan [name] is a 82 y.o. female who presents for Medicare Annual (Subsequent) preventive examination.  Review of Systems      Cardiac Risk Factors include: advanced age (>23men, >37 women);hypertension;diabetes mellitus;sedentary lifestyle     Objective:    Today's Vitals   02/28/23 0949  Weight: 133 lb (60.3 kg)  Height: 5\' 3"  (1.6 m)   Body mass index is 23.56 kg/m.     02/28/2023    9:59 AM 08/05/2022    8:14 AM 07/29/2022   12:53 AM 02/08/2021    9:55 AM 01/21/2020    4:56 PM 01/21/2020   12:39 PM 01/19/2020   12:04 PM  Advanced Directives  Does Patient Have a Medical Advance Directive? Yes Yes Yes Yes No No Yes  Type of Estate agent of [place];Living will Healthcare Power of [place];Living will Healthcare Power of [place];Living will;Out of facility DNR (pink MOST or yellow form) Healthcare Power of [place];Living will Healthcare Power of [place];Living will Healthcare Power of [place];Living will Healthcare Power of [place];Living will  Does patient want to make changes to medical advance directive?  No - Patient declined       Copy of Healthcare Power of Attorney in Chart? No - copy requested No - copy requested, Physician notified  No - copy requested No - copy requested No - copy requested No - copy requested  Would patient like information on creating a medical advance directive?     No - Patient declined      Current Medications (verified) Outpatient Encounter Medications as of 02/28/2023  Medication Sig   Alum & Mag Hydroxide-Simeth (MAALOX REGULAR STRENGTH PO) Take  by mouth daily as needed. Patient takes 1 capful daily of the powder formulation.   aspirin EC 81 MG tablet Take 1 tablet (81 mg total) by mouth daily.   atorvastatin (LIPITOR) 80 MG tablet TAKE 1 TABLET BY MOUTH EVERY DAY   BLINK TEARS 0.25 % SOLN Place 1 drop into both eyes daily as needed (dry/irritated eyes.).   calcium-vitamin D (OSCAL WITH D) 500-200 MG-UNIT tablet Take 1 tablet by mouth 2 (two) times daily.   chlorthalidone (HYGROTON) 25 MG tablet TAKE 1 TABLET BY MOUTH EVERY DAY   CINNAMON PO Take 1,000 mg by mouth in the morning and at bedtime.   ferrous sulfate 325 (65 FE) MG tablet Take 325 mg by mouth daily.   gabapentin (NEURONTIN) 300 MG capsule TAKE 2 CAPSULES BY MOUTH AT BEDTIME   Lancet Device MISC Use to check blood sugar twice daily.   Lancets (FREESTYLE) lancets Use to check blood sugar twice daily   metFORMIN (GLUCOPHAGE-XR) 500 MG 24 hr tablet TAKE 2 TABLETS BY MOUTH TWICE A DAY   methylcellulose (CITRUCEL) oral powder Use as directed daily   metoprolol succinate (TOPROL-XL) 100 MG 24 hr tablet TAKE 1 TABLET BY MOUTH EVERY DAY WITH OR IMMEDIATELY FOLLOWING A MEAL   Multiple Vitamins-Minerals (PRESERVISION AREDS PO) Take 1 tablet by mouth 2 (two) times daily. Reported on 12/23/2015   nitroGLYCERIN (NITROSTAT) 0.4 MG SL tablet PLACE 1 TABLET (0.4 MG TOTAL) UNDER THE TONGUE EVERY 5 (FIVE) MINUTES AS NEEDED FOR CHEST PAIN  ONETOUCH ULTRA test strip USE TO CHECK BLOOD SUGAR TWICE DAILY   Probiotic Product (ALIGN PO) Take by mouth at bedtime.   QUEtiapine (SEROQUEL) 25 MG tablet TAKE 1 TABLET BY MOUTH EVERYDAY AT BEDTIME   rOPINIRole (REQUIP) 1 MG tablet 1/2 tablet during the day, 2 tablets at bedtime   tolterodine (DETROL LA) 4 MG 24 hr capsule Take 1 capsule (4 mg total) by mouth daily. (Patient taking differently: Take 4 mg by mouth daily. Takes 2 capsules by mouth daily)   venlafaxine XR (EFFEXOR-XR) 75 MG 24 hr capsule TAKE 1 CAPSULE BY MOUTH DAILY WITH BREAKFAST.   vitamin  B-12 (CYANOCOBALAMIN) 1000 MCG tablet Take 1,000 mcg by mouth daily.   No facility-administered encounter medications on file as of 02/28/2023.    Allergies (verified) Hydrocodone-acetaminophen, Percocet [oxycodone-acetaminophen], and Sulfa antibiotics   History: Past Medical History:  Diagnosis Date   Angina    Arthritis    Atrial fibrillation    ASPIRIN FOR BLOOD THINNER   Atypical mole 09/22/2016   Bulging discs    lumbar    Complication of anesthesia    pt states has choking sensation with ET tube    Coronary artery disease    Depression    Diabetes mellitus    diet controlled/on meds   Family history of breast cancer    Family history of colon cancer    Fibromyalgia    GERD (gastroesophageal reflux disease)    H/O hiatal hernia    Headache(784.0)    "recurring"   High cholesterol    History of colon polyps    Hyperlipidemia    Hypertension    Jackhammer esophagus    Melanoma    melanoma   Migraines    "til ~ 1980"   PONV (postoperative nausea and vomiting)    Restless leg syndrome    Sciatic nerve pain    "from pinched nerve"   Sleep apnea    uses CPAP   Stroke 2014   no deficits   Weakness of right side of body    "I've had PT for it; they don't know what it's from".  CORTISONE INJECTION INTO BACK 08/30/12   Past Surgical History:  Procedure Laterality Date   28 HOUR PH STUDY N/A 12/29/2015   Procedure: 24 HOUR PH STUDY;  Surgeon: Ruffin Frederick, MD;  Location: [facility];  Service: Gastroenterology;  Laterality: N/A;   CARPAL TUNNEL RELEASE Left 07/02/2015   Procedure: CARPAL TUNNEL RELEASE;  Surgeon: Gean Birchwood, MD;  Location: [facility];  Service: Orthopedics;  Laterality: Left;   CATARACT EXTRACTION, BILATERAL Bilateral    Oct and Nov 2017   COLONOSCOPY  06/30/2019   CORONARY ANGIOPLASTY WITH STENT PLACEMENT  06/2011   "1"   CORONARY ARTERY BYPASS GRAFT  2005   CABG X 2   DILATION AND CURETTAGE OF UTERUS     "more than  once"   ELBOW ARTHROSCOPY Left 07/02/2015   Procedure: ARTHROSCOPY LEFT ELBOW WITH DEBRIDEMENT AND REMOVAL LOOSE BODY;  Surgeon: Gean Birchwood, MD;  Location: [facility];  Service: Orthopedics;  Laterality: Left;   ESOPHAGEAL DILATION  01/21/2020   Procedure: ESOPHAGEAL DILATION;  Surgeon: Shellia Cleverly, DO;  Location: [facility];  Service: Gastroenterology;;   ESOPHAGEAL MANOMETRY N/A 12/29/2015   Procedure: ESOPHAGEAL MANOMETRY (EM);  Surgeon: Ruffin Frederick, MD;  Location: [facility];  Service: Gastroenterology;  Laterality: N/A;   ESOPHAGEAL MANOMETRY N/A 07/30/2019   Procedure: ESOPHAGEAL MANOMETRY (EM);  Surgeon: Benancio Deeds, MD;  Location: [facility];  Service: Gastroenterology;  Laterality: N/A;   ESOPHAGOGASTRODUODENOSCOPY (EGD) WITH PROPOFOL N/A 01/21/2020   Procedure: ESOPHAGOGASTRODUODENOSCOPY (EGD) WITH PROPOFOL;  Surgeon: Shellia Cleverly, DO;  Location: [facility];  Service: Gastroenterology;  Laterality: N/A;   FRACTURE SURGERY  ~ 2005   nose   KNEE ARTHROSCOPY  09/04/2012   Procedure: ARTHROSCOPY KNEE;  Surgeon: Loanne Drilling, MD;  Location: [facility];  Service: Orthopedics;  Laterality: Right;  right knee arthroscopy with medial and lateral meniscus debridement   MELANOMA EXCISION Right 10/13/2016   right side of neck   MOUTH SURGERY  2004   "bone replacement; had cadavear bones put in; face was collapsing"   NASAL SEPTUM SURGERY  ~ 1986   TOTAL KNEE ARTHROPLASTY Right 07/07/2013   Procedure: RIGHT TOTAL KNEE ARTHROPLASTY;  Surgeon: Loanne Drilling, MD;  Location: [facility];  Service: Orthopedics;  Laterality: Right;   TRANSORAL INCISIONLESS FUNDOPLICATION N/A 01/21/2020   Procedure: TRANSORAL INCISIONLESS FUNDOPLICATION;  Surgeon: Shellia Cleverly, DO;  Location: [facility];  Service: Gastroenterology;  Laterality: N/A;   UPPER GASTROINTESTINAL ENDOSCOPY     Family History  Problem Relation Age of Onset   Breast cancer Mother 70    Heart disease Father 16       sudden onset due to CAD   Hypertension Sister    Dementia Sister    Diabetes Sister    Cancer - Colon Sister 48   Colon cancer Sister    GER disease Daughter    Hypertension Daughter    Heart disease Brother    Hypertension Brother    Heart attack Neg Hx    Stroke Neg Hx    Esophageal cancer Neg Hx    Rectal cancer Neg Hx    Stomach cancer Neg Hx    Social History   Socioeconomic History   Marital status: Married    Spouse name: Not on file   Number of children: 2   Years of education: college   Highest education level: Not on file  Occupational History   Occupation: Retired   Tobacco Use   Smoking status: Never   Smokeless tobacco: Never  Vaping Use   Vaping Use: Never used  Substance and Sexual Activity   Alcohol use: Yes    Alcohol/week: 1.0 standard drink of alcohol    Types: 1 Glasses of wine per week    Comment: "occasionally drink wine"   Drug use: No   Sexual activity: Yes  Other Topics Concern   Not on file  Social History Narrative   Drinks 1 cup of coffee a day    Social Determinants of Health   Financial Resource Strain: Low Risk  (02/28/2023)   Overall Financial Resource Strain (CARDIA)    Difficulty of Paying Living Expenses: Not hard at all  Food Insecurity: No Food Insecurity (02/28/2023)   Hunger Vital Sign    Worried About Running Out of Food in the Last Year: Never true    Ran Out of Food in the Last Year: Never true  Transportation Needs: No Transportation Needs (02/28/2023)   PRAPARE - Administrator, Civil Service (Medical): No    Lack of Transportation (Non-Medical): No  Physical Activity: Inactive (02/28/2023)   Exercise Vital Sign    Days of Exercise per Week: 0 days    Minutes of Exercise per Session: 0 min  Stress: No Stress Concern Present (02/28/2023)   [employer] of Occupational  Health - Occupational Stress Questionnaire    Feeling of Stress : Not at all  Social Connections:  Socially Integrated (02/28/2023)   Social Connection and Isolation Panel [NHANES]    Frequency of Communication with Friends and Family: More than three times a week    Frequency of Social Gatherings with Friends and Family: More than three times a week    Attends Religious Services: More than 4 times per year    Active Member of [employer] or Organizations: Yes    Attends Engineer, structural: More than 4 times per year    Marital Status: Married    Tobacco Counseling Counseling given: Not Answered   Clinical Intake:  Pre-visit preparation completed: Yes  Pain : No/denies pain     Nutritional Risks: None Diabetes: Yes CBG done?: No (weekly) Did pt. bring in CBG monitor from home?: No  How often do you need to have someone help you when you read instructions, pamphlets, or other written materials from your doctor or pharmacy?: 1 - Never  Diabetic? Nutrition Risk Assessment:  Has the patient had any N/V/D within the last 2 months?  No  Does the patient have any non-healing wounds?  No  Has the patient had any unintentional weight loss or weight gain?  No   Diabetes:  Is the patient diabetic?  Yes  If diabetic, was a CBG obtained today?  No  Did the patient bring in their glucometer from home?  No  How often do you monitor your CBG's? Once weekly.   Financial Strains and Diabetes Management:  Are you having any financial strains with the device, your supplies or your medication? No .  Does the patient want to be seen by Chronic Care Management for management of their diabetes?  No  Would the patient like to be referred to a Nutritionist or for Diabetic Management?  No   Diabetic Exams:  Diabetic Eye Exam: Completed [facility] - 12/2022 Diabetic Foot Exam: Completed 01/2023 - podiatrist    Interpreter Needed?: No  Information entered by :: C.[place] LPN   Activities of Daily Living    02/28/2023   10:02 AM  In your present state of health, do  you have any difficulty performing the following activities:  Hearing? 0  Vision? 0  Difficulty concentrating or making decisions? 0  Walking or climbing stairs? 0  Dressing or bathing? 0  Doing errands, shopping? 0  Preparing Food and eating ? N  Using the Toilet? N  In the past six months, have you accidently leaked urine? Y  Comment occasionally when bladder is full  Do you have problems with loss of bowel control? N  Managing your Medications? N  Managing your Finances? N  Housekeeping or managing your Housekeeping? N    Patient Care Team: Excell Seltzer, MD as PCP - General (Family Medicine) Jake Bathe, MD as PCP - Cardiology (Cardiology) Quintella Reichert, MD as PCP - Sleep Medicine (Sleep Medicine) Mateo Flow, MD as Consulting Physician (Ophthalmology)  Indicate any recent Medical Services you may have received from other than [facility] providers in the past year (date may be approximate).     Assessment:   This is a routine wellness examination for Morgan [name].  Hearing/Vision screen Hearing Screening - Comments:: No aids Vision Screening - Comments:: Glasses - [employer]  Dietary issues and exercise activities discussed: Current Exercise Habits: The patient does not participate in regular exercise at present, Exercise limited by: None identified  Goals Addressed             This Visit's Progress    Patient Stated       Exercise more.       Depression Screen    02/28/2023    9:59 AM 02/21/2023   11:11 AM 08/22/2022   10:04 AM 05/19/2022   10:44 AM 02/07/2022   11:02 AM 09/20/2021    3:59 PM 07/21/2021   11:20 AM  PHQ 2/9 Scores  PHQ - 2 Score 0 0 0 4 4 0 5  PHQ- 9 Score 0 2 4 9 13  15     Fall Risk    02/28/2023   10:00 AM 02/21/2023   11:11 AM 02/07/2022    9:57 AM 09/20/2021    3:59 PM 02/08/2021    9:56 AM  Fall Risk   Falls in the past year? 1 1 0 1 0  Number falls in past yr: 1 1  0 0  Injury with Fall? 1 0  1 0  Comment Fx cheek,  nose and spine Followed by nerology      Risk for fall due to : Medication side effect;Impaired balance/gait No Fall Risks   Medication side effect  Follow up Falls evaluation completed;Education provided;Falls prevention discussed Falls evaluation completed;Falls prevention discussed  Falls evaluation completed Falls evaluation completed;Falls prevention discussed    FALL RISK PREVENTION PERTAINING TO THE HOME:  Any stairs in or around the home? No  If so, are there any without handrails? No  Home free of loose throw rugs in walkways, pet beds, electrical cords, etc? Yes  Adequate lighting in your home to reduce risk of falls? Yes   ASSISTIVE DEVICES UTILIZED TO PREVENT FALLS:  Life alert? No  Use of a cane, walker or w/c? No  Grab bars in the bathroom? Yes  Shower chair or bench in shower? Yes  Elevated toilet seat or a handicapped toilet? Yes    Cognitive Function:    02/08/2021   10:01 AM 01/19/2020   12:07 PM 12/23/2018   12:11 PM 12/07/2017    9:44 AM 11/14/2016   10:25 AM  MMSE - Mini Mental State Exam  Orientation to time 5 5 5 5 5   Orientation to Place 5 5 5 5 5   Registration 3 3 3 3 3   Attention/ Calculation 5 5 0 0 0  Recall 3 3 3 3 3   Language- name 2 objects   0 0 0  Language- repeat 1 1 1 1 1   Language- follow 3 step command   3 3 3   Language- read & follow direction   0 0 0  Write a sentence   0 0 0  Copy design   0 0 0  Total score   20 20 20         02/28/2023   10:03 AM  6CIT Screen  What Year? 0 points  What month? 0 points  What time? 0 points  Count back from 20 0 points  Months in reverse 0 points  Repeat phrase 0 points  Total Score 0 points    Immunizations Immunization History  Administered Date(s) Administered   COVID-19, mRNA, vaccine(Comirnaty)12 years and older 09/13/2022   Fluad Quad(high Dose 65+) 07/17/2019   Influenza, High Dose Seasonal PF 08/04/2016, 08/21/2017, 08/07/2018, 07/06/2020, 06/28/2021   Influenza,inj,Quad PF,6+ Mos  07/23/2014, 08/10/2015   PFIZER Comirnaty(Gray Top)Covid-19 Tri-Sucrose Vaccine 02/22/2021   PFIZER(Purple Top)SARS-COV-2 Vaccination 12/05/2019, 12/25/2019, 07/06/2020   Pfizer Covid-19 Vaccine Bivalent  Booster 53yrs & up 08/22/2021, 04/01/2022   Pneumococcal Conjugate-13 10/27/2014   Pneumococcal Polysaccharide-23 09/02/2007, 10/03/2013   Respiratory Syncytial Virus Vaccine,Recomb Aduvanted(Arexvy) 09/13/2022   Td 11/14/2007   Tdap 06/25/2018   Zoster Recombinat (Shingrix) 03/05/2018, 03/17/2020   Zoster, Live 02/09/2010    TDAP status: Up to date  Flu Vaccine status: Up to date  Pneumococcal vaccine status: Up to date  Covid-19 vaccine status: Completed vaccines  Qualifies for Shingles Vaccine? Yes   Zostavax completed Yes   Shingrix Completed?: Yes  Screening Tests Health Maintenance  Topic Date Due   OPHTHALMOLOGY EXAM  10/13/2021   COVID-19 Vaccine (8 - 2023-24 season) 03/09/2023 (Originally 11/08/2022)   MAMMOGRAM  05/20/2023   INFLUENZA VACCINE  06/14/2023   HEMOGLOBIN A1C  08/23/2023   Diabetic kidney evaluation - eGFR measurement  09/13/2023   Diabetic kidney evaluation - Urine ACR  02/21/2024   FOOT EXAM  02/21/2024   Medicare Annual Wellness (AWV)  02/28/2024   DTaP/Tdap/Td (3 - Td or Tdap) 06/25/2028   Pneumonia Vaccine 87+ Years old  Completed   DEXA SCAN  Completed   Zoster Vaccines- Shingrix  Completed   HPV VACCINES  Aged Out   COLONOSCOPY (Pts 45-54yrs Insurance coverage will need to be confirmed)  Discontinued    Health Maintenance  Health Maintenance Due  Topic Date Due   OPHTHALMOLOGY EXAM  10/13/2021    Colorectal cancer screening: No longer required.   Mammogram status: Completed 05/19/2021. Repeat every year order placed today.  Bone Density status: Completed 07/03/2019. Results reflect: Bone density results: OSTEOPENIA. Repeat every 2 years. Order placed today.  Lung Cancer Screening: (Low Dose CT Chest recommended if Age 73-80 years,  30 pack-year currently smoking OR have quit w/in 15years.) does not qualify.   Lung Cancer Screening Referral: no  Additional Screening:  Hepatitis C Screening: does not qualify; Completed no  Vision Screening: Recommended annual ophthalmology exams for early detection of glaucoma and other disorders of the eye. Is the patient up to date with their annual eye exam?  Yes pt calling to schedule yearly appointment.  Who is the provider or what is the name of the office in which the patient attends annual eye exams? [facility] If pt is not established with a provider, would they like to be referred to a provider to establish care? No .   Dental Screening: Recommended annual dental exams for proper oral hygiene  Community Resource Referral / Chronic Care Management: CRR required this visit?  No   CCM required this visit?  No      Plan:     I have personally reviewed and noted the following in the patient's chart:   Medical and social history Use of alcohol, tobacco or illicit drugs  Current medications and supplements including opioid prescriptions. Patient is not currently taking opioid prescriptions. Functional ability and status Nutritional status Physical activity Advanced directives List of other physicians Hospitalizations, surgeries, and ER visits in previous 12 months Vitals Screenings to include cognitive, depression, and falls Referrals and appointments  In addition, I have reviewed and discussed with patient certain preventive protocols, quality metrics, and best practice recommendations. A written personalized care plan for preventive services as well as general preventive health recommendations were provided to patient.     Maryan Puls, LPN   1/61/0960   Nurse Notes: Order placed for mammogram and dexa scan at pt's request.

## 2023-03-07 ENCOUNTER — Other Ambulatory Visit: Payer: Self-pay | Admitting: Family Medicine

## 2023-03-07 DIAGNOSIS — R269 Unspecified abnormalities of gait and mobility: Secondary | ICD-10-CM | POA: Diagnosis not present

## 2023-03-07 MED ORDER — GABAPENTIN 300 MG PO CAPS: ORAL_CAPSULE | ORAL | 0 refills | Status: DC

## 2023-03-07 NOTE — Telephone Encounter (Signed)
Patient called in stating that she had hand surgery and now Dr Janee Morn wants her to take 3  gabapentin (NEURONTIN) 300 MG capsule a day now along with the 2 that Dr Ermalene Searing wants her to take,so she is now taking 5 a day.She would like to know if she could have a refill with more pills called in for her since she has to take more now?

## 2023-03-07 NOTE — Telephone Encounter (Signed)
Spoke with Morgan [name] to confirm how she is currently taking the Gabapentin.  She states she is taking 1 capsule in the morning,  1 capsule in the evening and 3 capsules at bedtime.  They want her on this regimen until her hand heels properly.

## 2023-03-09 DIAGNOSIS — R269 Unspecified abnormalities of gait and mobility: Secondary | ICD-10-CM | POA: Diagnosis not present

## 2023-03-21 ENCOUNTER — Other Ambulatory Visit: Payer: Self-pay | Admitting: Family Medicine

## 2023-03-21 ENCOUNTER — Ambulatory Visit
Admission: RE | Admit: 2023-03-21 | Discharge: 2023-03-21 | Disposition: A | Discharge status: Home / Self Care | Payer: Medicare PPO | Source: Ambulatory Visit | Attending: Family Medicine | Admitting: Family Medicine

## 2023-03-21 DIAGNOSIS — M85832 Other specified disorders of bone density and structure, left forearm: Secondary | ICD-10-CM | POA: Diagnosis not present

## 2023-03-21 DIAGNOSIS — Z1231 Encounter for screening mammogram for malignant neoplasm of breast: Secondary | ICD-10-CM | POA: Diagnosis not present

## 2023-03-21 DIAGNOSIS — R92323 Mammographic fibroglandular density, bilateral breasts: Secondary | ICD-10-CM | POA: Diagnosis not present

## 2023-03-21 DIAGNOSIS — D692 Other nonthrombocytopenic purpura: Secondary | ICD-10-CM | POA: Diagnosis not present

## 2023-03-21 DIAGNOSIS — Z780 Asymptomatic menopausal state: Secondary | ICD-10-CM

## 2023-03-21 DIAGNOSIS — L821 Other seborrheic keratosis: Secondary | ICD-10-CM | POA: Diagnosis not present

## 2023-03-22 ENCOUNTER — Ambulatory Visit: Payer: Medicare PPO

## 2023-03-23 DIAGNOSIS — R269 Unspecified abnormalities of gait and mobility: Secondary | ICD-10-CM | POA: Diagnosis not present

## 2023-03-28 DIAGNOSIS — R269 Unspecified abnormalities of gait and mobility: Secondary | ICD-10-CM | POA: Diagnosis not present

## 2023-04-03 DIAGNOSIS — M1811 Unilateral primary osteoarthritis of first carpometacarpal joint, right hand: Secondary | ICD-10-CM | POA: Diagnosis not present

## 2023-04-03 DIAGNOSIS — M65341 Trigger finger, right ring finger: Secondary | ICD-10-CM | POA: Diagnosis not present

## 2023-04-03 DIAGNOSIS — M65351 Trigger finger, right little finger: Secondary | ICD-10-CM | POA: Diagnosis not present

## 2023-04-03 DIAGNOSIS — M65331 Trigger finger, right middle finger: Secondary | ICD-10-CM | POA: Diagnosis not present

## 2023-04-04 DIAGNOSIS — R269 Unspecified abnormalities of gait and mobility: Secondary | ICD-10-CM | POA: Diagnosis not present

## 2023-04-13 DIAGNOSIS — Z87820 Personal history of traumatic brain injury: Secondary | ICD-10-CM | POA: Diagnosis not present

## 2023-04-13 DIAGNOSIS — R269 Unspecified abnormalities of gait and mobility: Secondary | ICD-10-CM | POA: Diagnosis not present

## 2023-04-13 DIAGNOSIS — G2581 Restless legs syndrome: Secondary | ICD-10-CM | POA: Diagnosis not present

## 2023-04-13 DIAGNOSIS — Z8669 Personal history of other diseases of the nervous system and sense organs: Secondary | ICD-10-CM | POA: Diagnosis not present

## 2023-04-13 DIAGNOSIS — R2689 Other abnormalities of gait and mobility: Secondary | ICD-10-CM | POA: Diagnosis not present

## 2023-04-13 DIAGNOSIS — R55 Syncope and collapse: Secondary | ICD-10-CM | POA: Diagnosis not present

## 2023-04-13 DIAGNOSIS — R4189 Other symptoms and signs involving cognitive functions and awareness: Secondary | ICD-10-CM | POA: Diagnosis not present

## 2023-04-17 ENCOUNTER — Other Ambulatory Visit: Payer: Self-pay | Admitting: Family Medicine

## 2023-04-18 DIAGNOSIS — R269 Unspecified abnormalities of gait and mobility: Secondary | ICD-10-CM | POA: Diagnosis not present

## 2023-04-19 DIAGNOSIS — R269 Unspecified abnormalities of gait and mobility: Secondary | ICD-10-CM | POA: Diagnosis not present

## 2023-04-25 ENCOUNTER — Ambulatory Visit (INDEPENDENT_AMBULATORY_CARE_PROVIDER_SITE_OTHER): Payer: Medicare PPO | Admitting: Clinical

## 2023-04-25 DIAGNOSIS — R269 Unspecified abnormalities of gait and mobility: Secondary | ICD-10-CM | POA: Diagnosis not present

## 2023-04-25 DIAGNOSIS — F411 Generalized anxiety disorder: Secondary | ICD-10-CM | POA: Diagnosis not present

## 2023-04-25 NOTE — Progress Notes (Signed)
                Galilee Pierron, LCSW 
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
## 2023-04-27 DIAGNOSIS — R269 Unspecified abnormalities of gait and mobility: Secondary | ICD-10-CM | POA: Diagnosis not present

## 2023-05-02 DIAGNOSIS — R269 Unspecified abnormalities of gait and mobility: Secondary | ICD-10-CM | POA: Diagnosis not present

## 2023-05-04 DIAGNOSIS — R269 Unspecified abnormalities of gait and mobility: Secondary | ICD-10-CM | POA: Diagnosis not present

## 2023-05-09 DIAGNOSIS — R269 Unspecified abnormalities of gait and mobility: Secondary | ICD-10-CM | POA: Diagnosis not present

## 2023-05-11 DIAGNOSIS — R269 Unspecified abnormalities of gait and mobility: Secondary | ICD-10-CM | POA: Diagnosis not present

## 2023-05-14 ENCOUNTER — Ambulatory Visit: Payer: Medicare PPO | Admitting: Urology

## 2023-05-14 VITALS — BP 179/69 | HR 69 | Ht 63.0 in | Wt 133.0 lb

## 2023-05-14 DIAGNOSIS — N3946 Mixed incontinence: Secondary | ICD-10-CM

## 2023-05-14 DIAGNOSIS — N3941 Urge incontinence: Secondary | ICD-10-CM | POA: Diagnosis not present

## 2023-05-14 LAB — MICROSCOPIC EXAMINATION

## 2023-05-14 LAB — URINALYSIS, COMPLETE
Bilirubin, UA: NEGATIVE
Glucose, UA: NEGATIVE
Ketones, UA: NEGATIVE
Leukocytes,UA: NEGATIVE
Nitrite, UA: NEGATIVE
Protein,UA: NEGATIVE
RBC, UA: NEGATIVE
Specific Gravity, UA: 1.015 (ref 1.005–1.030)
Urobilinogen, Ur: 1 mg/dL (ref 0.2–1.0)
pH, UA: 5.5 (ref 5.0–7.5)

## 2023-05-14 NOTE — Progress Notes (Signed)
05/14/2023 11:04 AM   Morgan [name] 28-Sep-1941 [id number]  Referring provider: Excell Seltzer, MD [address] [place],  [place] [address]  Chief Complaint  Patient presents with   New Patient (Initial Visit)   Urinary Incontinence    HPI: Was consulted to assess the patient's urinary incontinence.  She has urge incontinence.  Sometimes leaks with coughing sneezing but not bending lifting.  No bedwetting.  Wears 1 pad a day that is damp.  She failed Myrbetriq and Detrol   she voids every 3-4 hours gets up once or twice a night.  She reports good flow  She has had a stroke and is on oral hypoglycemics.  Has had a hysterectomy  No history of bladder surgery kidney stones or bladder infections.  Tends towards constipation   PMH: Past Medical History:  Diagnosis Date   Angina    Arthritis    Atrial fibrillation (HCC)    ASPIRIN FOR BLOOD THINNER   Atypical mole 09/22/2016   Bulging discs    lumbar    Complication of anesthesia    pt states has choking sensation with ET tube    Coronary artery disease    Depression    Diabetes mellitus    diet controlled/on meds   Family history of breast cancer    Family history of colon cancer    Fibromyalgia    GERD (gastroesophageal reflux disease)    H/O hiatal hernia    Headache(784.0)    "recurring"   High cholesterol    History of colon polyps    Hyperlipidemia    Hypertension    Jackhammer esophagus    Melanoma (HCC)    melanoma   Migraines    "til ~ 1980"   PONV (postoperative nausea and vomiting)    Restless leg syndrome    Sciatic nerve pain    "from pinched nerve"   Sleep apnea    uses CPAP   Stroke (HCC) 2014   no deficits   Weakness of right side of body    "I've had PT for it; they don't know what it's from".  CORTISONE INJECTION INTO BACK 08/30/12    Surgical History: Past Surgical History:  Procedure Laterality Date   88 HOUR PH STUDY N/A 12/29/2015   Procedure: 24 HOUR PH STUDY;   Surgeon: Ruffin Frederick, MD;  [facility];  Service: Gastroenterology;  Laterality: N/A;   CARPAL TUNNEL RELEASE Left 07/02/2015   Procedure: CARPAL TUNNEL RELEASE;  Surgeon: Gean Birchwood, MD;  Location: [facility];  Service: Orthopedics;  Laterality: Left;   CATARACT EXTRACTION, BILATERAL Bilateral    Oct and Nov 2017   COLONOSCOPY  06/30/2019   CORONARY ANGIOPLASTY WITH STENT PLACEMENT  06/2011   "1"   CORONARY ARTERY BYPASS GRAFT  2005   CABG X 2   DILATION AND CURETTAGE OF UTERUS     "more than once"   ELBOW ARTHROSCOPY Left 07/02/2015   Procedure: ARTHROSCOPY LEFT ELBOW WITH DEBRIDEMENT AND REMOVAL LOOSE BODY;  Surgeon: Gean Birchwood, MD;  Location: [facility];  Service: Orthopedics;  Laterality: Left;   ESOPHAGEAL DILATION  01/21/2020   Procedure: ESOPHAGEAL DILATION;  Surgeon: Shellia Cleverly, DO;  [facility];  Service: Gastroenterology;;   ESOPHAGEAL MANOMETRY N/A 12/29/2015   Procedure: ESOPHAGEAL MANOMETRY (EM);  Surgeon: Ruffin Frederick, MD;  [facility];  Service: Gastroenterology;  Laterality: N/A;   ESOPHAGEAL MANOMETRY N/A 07/30/2019   Procedure: ESOPHAGEAL MANOMETRY (  EM);  Surgeon: Benancio Deeds, MD;  Location: [facility];  Service: Gastroenterology;  Laterality: N/A;   ESOPHAGOGASTRODUODENOSCOPY (EGD) WITH PROPOFOL N/A 01/21/2020   Procedure: ESOPHAGOGASTRODUODENOSCOPY (EGD) WITH PROPOFOL;  Surgeon: Shellia Cleverly, DO;  [facility];  Service: Gastroenterology;  Laterality: N/A;   FRACTURE SURGERY  ~ 2005   nose   KNEE ARTHROSCOPY  09/04/2012   Procedure: ARTHROSCOPY KNEE;  Surgeon: Loanne Drilling, MD;  [facility];  Service: Orthopedics;  Laterality: Right;  right knee arthroscopy with medial and lateral meniscus debridement   MELANOMA EXCISION Right 10/13/2016   right side of neck   MOUTH SURGERY  2004   "bone replacement; had cadavear bones put in; face was collapsing"    NASAL SEPTUM SURGERY  ~ 1986   TOTAL KNEE ARTHROPLASTY Right 07/07/2013   Procedure: RIGHT TOTAL KNEE ARTHROPLASTY;  Surgeon: Loanne Drilling, MD;  [facility];  Service: Orthopedics;  Laterality: Right;   TRANSORAL INCISIONLESS FUNDOPLICATION N/A 01/21/2020   Procedure: TRANSORAL INCISIONLESS FUNDOPLICATION;  Surgeon: Shellia Cleverly, DO;  [facility];  Service: Gastroenterology;  Laterality: N/A;   UPPER GASTROINTESTINAL ENDOSCOPY      Home Medications:  Allergies as of 05/14/2023       Reactions   Hydrocodone-acetaminophen Other (See Comments)   "Changed personality" "made me very mean"   Percocet [oxycodone-acetaminophen] Other (See Comments)   hallucination   Sulfa Antibiotics Other (See Comments)   hallucinations        Medication List        Accurate as of May 14, 2023 11:04 AM. If you have any questions, ask your nurse or doctor.          ALIGN PO Take by mouth at bedtime.   aspirin EC 81 MG tablet Take 1 tablet (81 mg total) by mouth daily.   atorvastatin 80 MG tablet Commonly known as: LIPITOR TAKE 1 TABLET BY MOUTH EVERY DAY   Blink Tears 0.25 % Soln Generic drug: Polyethylene Glycol 400 Place 1 drop into both eyes daily as needed (dry/irritated eyes.).   calcium-vitamin D 500-200 MG-UNIT tablet Commonly known as: OSCAL WITH D Take 1 tablet by mouth 2 (two) times daily.   chlorthalidone 25 MG tablet Commonly known as: HYGROTON TAKE 1 TABLET BY MOUTH EVERY DAY   CINNAMON PO Take 1,000 mg by mouth in the morning and at bedtime.   Citrucel oral powder Generic drug: methylcellulose Use as directed daily   cyanocobalamin 1000 MCG tablet Commonly known as: VITAMIN B12 Take 1,000 mcg by mouth daily.   ferrous sulfate 325 (65 FE) MG tablet Take 325 mg by mouth daily.   freestyle lancets Use to check blood sugar twice daily   gabapentin 300 MG capsule Commonly known as: NEURONTIN TAKE ONE CAPSULE IN THE MORNING, ONE CAPSULE  IN THE EVENING AND 3 CAPSULES AT BEDTIME.   Lancet Device Misc Use to check blood sugar twice daily.   MAALOX REGULAR STRENGTH PO Take by mouth daily as needed. Patient takes 1 capful daily of the powder formulation.   metFORMIN 500 MG 24 hr tablet Commonly known as: GLUCOPHAGE-XR TAKE 2 TABLETS BY MOUTH TWICE A DAY   metoprolol succinate 100 MG 24 hr tablet Commonly known as: TOPROL-XL TAKE 1 TABLET BY MOUTH EVERY DAY WITH OR IMMEDIATELY FOLLOWING A MEAL   nitroGLYCERIN 0.4 MG SL tablet Commonly known as: NITROSTAT PLACE 1 TABLET (0.4 MG TOTAL) UNDER THE TONGUE EVERY 5 (FIVE) MINUTES AS NEEDED FOR CHEST  PAIN   OneTouch Ultra test strip Generic drug: glucose blood USE TO CHECK BLOOD SUGAR TWICE DAILY   PRESERVISION AREDS PO Take 1 tablet by mouth 2 (two) times daily. Reported on 12/23/2015   QUEtiapine 25 MG tablet Commonly known as: SEROQUEL TAKE 1 TABLET BY MOUTH EVERYDAY AT BEDTIME   rOPINIRole 1 MG tablet Commonly known as: Requip 1/2 tablet during the day, 2 tablets at bedtime   tolterodine 4 MG 24 hr capsule Commonly known as: DETROL LA Take 1 capsule (4 mg total) by mouth daily. What changed: additional instructions   venlafaxine XR 75 MG 24 hr capsule Commonly known as: EFFEXOR-XR TAKE 1 CAPSULE BY MOUTH DAILY WITH BREAKFAST.        Allergies:  Allergies  Allergen Reactions   Hydrocodone-Acetaminophen Other (See Comments)    "Changed personality" "made me very mean"   Percocet [Oxycodone-Acetaminophen] Other (See Comments)    hallucination   Sulfa Antibiotics Other (See Comments)    hallucinations    Family History: Family History  Problem Relation Age of Onset   Breast cancer Mother 81   Heart disease Father 60       sudden onset due to CAD   Hypertension Sister    Dementia Sister    Diabetes Sister    Cancer - Colon Sister 67   Colon cancer Sister    GER disease Daughter    Hypertension Daughter    Heart disease Brother    Hypertension  Brother    Heart attack Neg Hx    Stroke Neg Hx    Esophageal cancer Neg Hx    Rectal cancer Neg Hx    Stomach cancer Neg Hx     Social History:  reports that she has never smoked. She has never used smokeless tobacco. She reports current alcohol use of about 1.0 standard drink of alcohol per week. She reports that she does not use drugs.  ROS:                                        Physical Exam: There were no vitals taken for this visit.  Constitutional:  Alert and oriented, No acute distress. HEENT: [place] AT, moist mucus membranes.  Trachea midline, no masses. Cardiovascular: No clubbing, cyanosis, or edema. Respiratory: Normal respiratory effort, no increased work of breathing. GI: Abdomen is soft, nontender, nondistended, no abdominal masses GU: Well supported bladder neck.  Grade 1 cystocele.  Modest atrophy.  No stress incontinence Skin: No rashes, bruises or suspicious lesions. Lymph: No cervical or inguinal adenopathy. Neurologic: Grossly intact, no focal deficits, moving all 4 extremities. Psychiatric: Normal mood and affect.  Laboratory Data: Lab Results  Component Value Date   WBC 7.2 09/12/2022   HGB 11.6 (L) 09/12/2022   HCT 35.3 (L) 09/12/2022   MCV 93.5 09/12/2022   PLT 284.0 09/12/2022    Lab Results  Component Value Date   CREATININE 0.92 09/12/2022    No results found for: "PSA"  No results found for: "TESTOSTERONE"  Lab Results  Component Value Date   HGBA1C 7.7 (H) 02/21/2023    Urinalysis    Component Value Date/Time   COLORURINE YELLOW 07/01/2013 1247   APPEARANCEUR CLEAR 07/01/2013 1247   LABSPEC 1.022 07/01/2013 1247   PHURINE 6.0 07/01/2013 1247   GLUCOSEU NEGATIVE 07/01/2013 1247   HGBUR NEGATIVE 07/01/2013 1247   BILIRUBINUR Negative 07/08/2020 1109  KETONESUR NEGATIVE 07/01/2013 1247   PROTEINUR Positive (A) 07/08/2020 1109   PROTEINUR NEGATIVE 07/01/2013 1247   UROBILINOGEN 0.2 07/08/2020 1109    UROBILINOGEN 1.0 07/01/2013 1247   NITRITE Positive 07/08/2020 1109   NITRITE NEGATIVE 07/01/2013 1247   LEUKOCYTESUR Large (3+) (A) 07/08/2020 1109    Pertinent Imaging: Urine reviewed.  Chart reviewed.  Urine sent for culture.  Assessment & Plan: Patient has mild mixed incontinence.  Has failed 2 medications.  He is primarily bothered by her incontinence.  Call if culture positive.  Perform cystoscopy in 6 weeks on Gemtesa samples and prescription.  I think it would be okay to treat her without urodynamics and percutaneous tibial nerve stimulation based on age and severity may be an excellent option for her.  1. Urge incontinence  - Urinalysis, Complete   No follow-ups on file.  Martina Sinner, MD  [facility] [address], [address] [place], [place] [address] [phone]

## 2023-05-15 ENCOUNTER — Telehealth: Payer: Self-pay

## 2023-05-15 DIAGNOSIS — N3941 Urge incontinence: Secondary | ICD-10-CM

## 2023-05-15 MED ORDER — GEMTESA 75 MG PO TABS
75.0000 mg | ORAL_TABLET | Freq: Every day | ORAL | 0 refills | Status: DC
Start: 2023-05-15 — End: 2023-07-09

## 2023-05-15 NOTE — Addendum Note (Signed)
Addended by: Consuella Lose on: 05/15/2023 03:39 PM   Modules accepted: Orders

## 2023-05-15 NOTE — Addendum Note (Signed)
Addended byRanda Lynn on: 05/15/2023 08:30 AM   Modules accepted: Orders

## 2023-05-15 NOTE — Telephone Encounter (Signed)
LVM for patient to return call to schedule lab appointment for a urine culture

## 2023-05-15 NOTE — Telephone Encounter (Signed)
Patient advised. Patient will come tomorrow 05/16/23 to drop urine sample off. Order placed.

## 2023-05-16 ENCOUNTER — Other Ambulatory Visit: Payer: Medicare PPO

## 2023-05-16 DIAGNOSIS — N3941 Urge incontinence: Secondary | ICD-10-CM

## 2023-05-19 LAB — CULTURE, URINE COMPREHENSIVE

## 2023-05-21 LAB — CULTURE, URINE COMPREHENSIVE

## 2023-05-31 ENCOUNTER — Ambulatory Visit (INDEPENDENT_AMBULATORY_CARE_PROVIDER_SITE_OTHER): Payer: Medicare PPO | Admitting: Clinical

## 2023-05-31 ENCOUNTER — Encounter: Payer: Self-pay | Admitting: Family Medicine

## 2023-05-31 DIAGNOSIS — F411 Generalized anxiety disorder: Secondary | ICD-10-CM

## 2023-05-31 NOTE — Progress Notes (Signed)
[facility] Counselor/Therapist Progress Note  Patient ID: Morgan [name], MRN: [id number]    Date: 05/31/23  Time Spent: 9:34  am - 10:18 am : 44 Minutes  Treatment Type: Individual Therapy.  Reported Symptoms: Patient reported worry and feeling overwhelmed at times.   Mental Status Exam: Appearance:  Neat and Well Groomed     Behavior: Appropriate  Motor: Normal  Speech/Language:  Clear and Coherent  Affect: Appropriate  Mood: normal  Thought process: normal  Thought content:   WNL  Sensory/Perceptual disturbances:   WNL  Orientation: oriented to person, place, and situation  Attention: Good  Concentration: Good  Memory: WNL  Fund of knowledge:  Good  Insight:   Good  Judgment:  Good  Impulse Control: Good   Risk Assessment: Danger to Self:  No Patient denied current suicidal ideation  Self-injurious Behavior: No Danger to Others: No Patient denied current homicidal ideation Duty to Warn:no Physical Aggression / Violence:No  Access to Firearms a concern: No  Gang Involvement:No   Subjective:  Patient stated, "yes, they have changed drastically" since last session. Patient reported patient/husband are putting their house up for sale and preparing to move to [facility]. Patient stated, "its overwhelming" in regards to patient/husband's upcoming trip to [place] and selling their house.  Patient stated "I get overwhelmed with all I have to do" and reported she finds caregiving challenging. Patient reported her husband has been diagnosed with stage 4 prostate cancer. Patient reported fear of the unknown. Patient stated, "pretty good" in response to patient's mood the majority of the days since last session. Patient reported fatigue in the evenings. Patient stated, "I think its probably right on" in response to the diagnosis. Patient reported she is open to participation in therapy. Patient stated, "for me to be a better caregiver" and "be a better person at home" in  response to potential goals for therapy. Patient reported she doesn't feel she listens during conversations with her husband. Patient stated, "to be more patient", "to be mindful and not have that worry hanging over me" in response to potential goals for therapy. Patient reported patient/husband provide financial support to their son and patient does not agree with husband providing financial support to their son.    Interventions: Motivational Interviewing. Clinician conducted session via caregility video from clinician's office at [facility]. Patient provided verbal consent to proceed with telehealth session and is aware of limitations of telephone or video visits. Patient participated in session from patient's home. Reviewed changes since last session. Assessed patient's mood since last session and patient's current mood. Clinician reviewed diagnosis and treatment recommendations. Provided psycho education related to diagnosis and treatment. Clinician utilized motivational interviewing to explore potential goals for therapy.   Collaboration of Care: Other not required at this time   Diagnosis:  Generalized anxiety disorder   Plan: Goals to be developed during follow up appointment on 07/03/23.      Doree Barthel, LCSW

## 2023-06-02 ENCOUNTER — Other Ambulatory Visit: Payer: Self-pay | Admitting: Family Medicine

## 2023-06-04 NOTE — Telephone Encounter (Signed)
Last office visit 02/21/23 for CPE.  Last refilled 04/18/23 for #[id number] with no refills.  Next Appt: No future appointments with PCP.

## 2023-06-26 DIAGNOSIS — K08109 Complete loss of teeth, unspecified cause, unspecified class: Secondary | ICD-10-CM | POA: Diagnosis not present

## 2023-06-26 DIAGNOSIS — Z8673 Personal history of transient ischemic attack (TIA), and cerebral infarction without residual deficits: Secondary | ICD-10-CM | POA: Diagnosis not present

## 2023-06-26 DIAGNOSIS — Z808 Family history of malignant neoplasm of other organs or systems: Secondary | ICD-10-CM | POA: Diagnosis not present

## 2023-06-26 DIAGNOSIS — G4700 Insomnia, unspecified: Secondary | ICD-10-CM | POA: Diagnosis not present

## 2023-06-26 DIAGNOSIS — Z820 Family history of epilepsy and other diseases of the nervous system: Secondary | ICD-10-CM | POA: Diagnosis not present

## 2023-06-26 DIAGNOSIS — I2510 Atherosclerotic heart disease of native coronary artery without angina pectoris: Secondary | ICD-10-CM | POA: Diagnosis not present

## 2023-06-26 DIAGNOSIS — Z7984 Long term (current) use of oral hypoglycemic drugs: Secondary | ICD-10-CM | POA: Diagnosis not present

## 2023-06-26 DIAGNOSIS — Z85820 Personal history of malignant melanoma of skin: Secondary | ICD-10-CM | POA: Diagnosis not present

## 2023-06-26 DIAGNOSIS — K5900 Constipation, unspecified: Secondary | ICD-10-CM | POA: Diagnosis not present

## 2023-06-26 DIAGNOSIS — G2581 Restless legs syndrome: Secondary | ICD-10-CM | POA: Diagnosis not present

## 2023-06-26 DIAGNOSIS — M1990 Unspecified osteoarthritis, unspecified site: Secondary | ICD-10-CM | POA: Diagnosis not present

## 2023-06-26 DIAGNOSIS — E785 Hyperlipidemia, unspecified: Secondary | ICD-10-CM | POA: Diagnosis not present

## 2023-06-26 DIAGNOSIS — Z885 Allergy status to narcotic agent status: Secondary | ICD-10-CM | POA: Diagnosis not present

## 2023-06-26 DIAGNOSIS — I10 Essential (primary) hypertension: Secondary | ICD-10-CM | POA: Diagnosis not present

## 2023-06-26 DIAGNOSIS — R269 Unspecified abnormalities of gait and mobility: Secondary | ICD-10-CM | POA: Diagnosis not present

## 2023-06-26 DIAGNOSIS — F325 Major depressive disorder, single episode, in full remission: Secondary | ICD-10-CM | POA: Diagnosis not present

## 2023-06-26 DIAGNOSIS — H3530 Unspecified macular degeneration: Secondary | ICD-10-CM | POA: Diagnosis not present

## 2023-06-26 DIAGNOSIS — Z8249 Family history of ischemic heart disease and other diseases of the circulatory system: Secondary | ICD-10-CM | POA: Diagnosis not present

## 2023-06-28 DIAGNOSIS — D2272 Melanocytic nevi of left lower limb, including hip: Secondary | ICD-10-CM | POA: Diagnosis not present

## 2023-06-28 DIAGNOSIS — D2262 Melanocytic nevi of left upper limb, including shoulder: Secondary | ICD-10-CM | POA: Diagnosis not present

## 2023-06-28 DIAGNOSIS — D2271 Melanocytic nevi of right lower limb, including hip: Secondary | ICD-10-CM | POA: Diagnosis not present

## 2023-06-28 DIAGNOSIS — D485 Neoplasm of uncertain behavior of skin: Secondary | ICD-10-CM | POA: Diagnosis not present

## 2023-06-28 DIAGNOSIS — D225 Melanocytic nevi of trunk: Secondary | ICD-10-CM | POA: Diagnosis not present

## 2023-06-28 DIAGNOSIS — D2261 Melanocytic nevi of right upper limb, including shoulder: Secondary | ICD-10-CM | POA: Diagnosis not present

## 2023-06-28 DIAGNOSIS — C44519 Basal cell carcinoma of skin of other part of trunk: Secondary | ICD-10-CM | POA: Diagnosis not present

## 2023-06-28 DIAGNOSIS — L821 Other seborrheic keratosis: Secondary | ICD-10-CM | POA: Diagnosis not present

## 2023-07-02 ENCOUNTER — Ambulatory Visit: Payer: Medicare PPO | Admitting: Urology

## 2023-07-02 VITALS — BP 143/72 | HR 71 | Ht 63.0 in | Wt 131.4 lb

## 2023-07-02 DIAGNOSIS — N3281 Overactive bladder: Secondary | ICD-10-CM

## 2023-07-02 DIAGNOSIS — N3946 Mixed incontinence: Secondary | ICD-10-CM

## 2023-07-02 LAB — URINALYSIS, COMPLETE
Bilirubin, UA: NEGATIVE
Glucose, UA: NEGATIVE
Ketones, UA: NEGATIVE
Nitrite, UA: NEGATIVE
Protein,UA: NEGATIVE
RBC, UA: NEGATIVE
Specific Gravity, UA: 1.02 (ref 1.005–1.030)
Urobilinogen, Ur: 0.2 mg/dL (ref 0.2–1.0)
pH, UA: 5.5 (ref 5.0–7.5)

## 2023-07-02 LAB — MICROSCOPIC EXAMINATION

## 2023-07-02 NOTE — Patient Instructions (Signed)

## 2023-07-02 NOTE — Progress Notes (Signed)
07/02/2023 11:15 AM   Morgan [name] November 05, 1941 [id number]  Referring provider: Excell Seltzer, MD [address] [place],  [place] [address]  Chief Complaint  Patient presents with   Cysto    HPI: Was consulted to assess the patient's urinary incontinence.  She has urge incontinence.  Sometimes leaks with coughing sneezing but not bending lifting.  No bedwetting.  Wears 1 pad a day that is damp.  She failed Myrbetriq and Detrol    she voids every 3-4 hours gets up once or twice a night.  She reports good flow   She has had a stroke and is on oral hypoglycemics.  Has had a hysterectomy  Patient has mild mixed incontinence. Has failed 2 medications. He is primarily bothered by her incontinence. Call if culture positive. Perform cystoscopy in 6 weeks on Gemtesa samples and prescription. I think it would be okay to treat her without urodynamics and percutaneous tibial nerve stimulation based on age and severity may be an excellent option for her.   Today Frequency stable.  Last culture negative Moderate improvement with good days and bad days on Gemtesa Cystoscopy: Patient underwent flexible cystoscopy.  Bladder mucosa and trigone were normal.  No cystitis.  No carcinoma On pelvic examination she had no prolapse with a mild narrowing of the introitus.  No stress incontinence after cystoscopy        PMH: Past Medical History:  Diagnosis Date   Angina    Arthritis    Atrial fibrillation (HCC)    ASPIRIN FOR BLOOD THINNER   Atypical mole 09/22/2016   Bulging discs    lumbar    Complication of anesthesia    pt states has choking sensation with ET tube    Coronary artery disease    Depression    Diabetes mellitus    diet controlled/on meds   Family history of breast cancer    Family history of colon cancer    Fibromyalgia    GERD (gastroesophageal reflux disease)    H/O hiatal hernia    Headache(784.0)    "recurring"   High cholesterol    History of colon  polyps    Hyperlipidemia    Hypertension    Jackhammer esophagus    Melanoma (HCC)    melanoma   Migraines    "til ~ 1980"   PONV (postoperative nausea and vomiting)    Restless leg syndrome    Sciatic nerve pain    "from pinched nerve"   Sleep apnea    uses CPAP   Stroke (HCC) 2014   no deficits   Weakness of right side of body    "I've had PT for it; they don't know what it's from".  CORTISONE INJECTION INTO BACK 08/30/12    Surgical History: Past Surgical History:  Procedure Laterality Date   70 HOUR PH STUDY N/A 12/29/2015   Procedure: 24 HOUR PH STUDY;  Surgeon: Ruffin Frederick, MD;  Location: [facility];  Service: Gastroenterology;  Laterality: N/A;   CARPAL TUNNEL RELEASE Left 07/02/2015   Procedure: CARPAL TUNNEL RELEASE;  Surgeon: Gean Birchwood, MD;  Location: [facility];  Service: Orthopedics;  Laterality: Left;   CATARACT EXTRACTION, BILATERAL Bilateral    Oct and Nov 2017   COLONOSCOPY  06/30/2019   CORONARY ANGIOPLASTY WITH STENT PLACEMENT  06/2011   "1"   CORONARY ARTERY BYPASS GRAFT  2005   CABG X 2   DILATION AND CURETTAGE OF UTERUS     "  more than once"   ELBOW ARTHROSCOPY Left 07/02/2015   Procedure: ARTHROSCOPY LEFT ELBOW WITH DEBRIDEMENT AND REMOVAL LOOSE BODY;  Surgeon: Gean Birchwood, MD;  Location: [facility];  Service: Orthopedics;  Laterality: Left;   ESOPHAGEAL DILATION  01/21/2020   Procedure: ESOPHAGEAL DILATION;  Surgeon: Shellia Cleverly, DO;  Location: [facility];  Service: Gastroenterology;;   ESOPHAGEAL MANOMETRY N/A 12/29/2015   Procedure: ESOPHAGEAL MANOMETRY (EM);  Surgeon: Ruffin Frederick, MD;  Location: [facility];  Service: Gastroenterology;  Laterality: N/A;   ESOPHAGEAL MANOMETRY N/A 07/30/2019   Procedure: ESOPHAGEAL MANOMETRY (EM);  Surgeon: Benancio Deeds, MD;  Location: [facility];  Service: Gastroenterology;  Laterality: N/A;   ESOPHAGOGASTRODUODENOSCOPY (EGD) WITH PROPOFOL N/A  01/21/2020   Procedure: ESOPHAGOGASTRODUODENOSCOPY (EGD) WITH PROPOFOL;  Surgeon: Shellia Cleverly, DO;  Location: [facility];  Service: Gastroenterology;  Laterality: N/A;   FRACTURE SURGERY  ~ 2005   nose   KNEE ARTHROSCOPY  09/04/2012   Procedure: ARTHROSCOPY KNEE;  Surgeon: Loanne Drilling, MD;  Location: [facility];  Service: Orthopedics;  Laterality: Right;  right knee arthroscopy with medial and lateral meniscus debridement   MELANOMA EXCISION Right 10/13/2016   right side of neck   MOUTH SURGERY  2004   "bone replacement; had cadavear bones put in; face was collapsing"   NASAL SEPTUM SURGERY  ~ 1986   TOTAL KNEE ARTHROPLASTY Right 07/07/2013   Procedure: RIGHT TOTAL KNEE ARTHROPLASTY;  Surgeon: Loanne Drilling, MD;  Location: [facility];  Service: Orthopedics;  Laterality: Right;   TRANSORAL INCISIONLESS FUNDOPLICATION N/A 01/21/2020   Procedure: TRANSORAL INCISIONLESS FUNDOPLICATION;  Surgeon: Shellia Cleverly, DO;  Location: [facility];  Service: Gastroenterology;  Laterality: N/A;   UPPER GASTROINTESTINAL ENDOSCOPY      Home Medications:  Allergies as of 07/02/2023       Reactions   Hydrocodone-acetaminophen Other (See Comments)   "Changed personality" "made me very mean"   Percocet [oxycodone-acetaminophen] Other (See Comments)   hallucination   Sulfa Antibiotics Other (See Comments)   hallucinations        Medication List        Accurate as of July 02, 2023 11:15 AM. If you have any questions, ask your nurse or doctor.          ALIGN PO Take by mouth at bedtime.   aspirin EC 81 MG tablet Take 1 tablet (81 mg total) by mouth daily.   atorvastatin 80 MG tablet Commonly known as: LIPITOR TAKE 1 TABLET BY MOUTH EVERY DAY   Blink Tears 0.25 % Soln Generic drug: Polyethylene Glycol 400 Place 1 drop into both eyes daily as needed (dry/irritated eyes.).   calcium-vitamin D 500-200 MG-UNIT tablet Commonly known as: OSCAL WITH D Take 1 tablet by mouth 2  (two) times daily.   chlorthalidone 25 MG tablet Commonly known as: HYGROTON TAKE 1 TABLET BY MOUTH EVERY DAY   CINNAMON PO Take 1,000 mg by mouth in the morning and at bedtime.   Citrucel oral powder Generic drug: methylcellulose Use as directed daily   cyanocobalamin 1000 MCG tablet Commonly known as: VITAMIN B12 Take 1,000 mcg by mouth daily.   ferrous sulfate 325 (65 FE) MG tablet Take 325 mg by mouth daily.   freestyle lancets Use to check blood sugar twice daily   gabapentin 300 MG capsule Commonly known as: NEURONTIN TAKE ONE CAPSULE IN THE MORNING, ONE CAPSULE IN THE EVENING AND 3 CAPSULES AT BEDTIME.   Gemtesa 75 MG Tabs  Generic drug: Vibegron Take 1 tablet (75 mg total) by mouth daily.   Lancet Device Misc Use to check blood sugar twice daily.   MAALOX REGULAR STRENGTH PO Take by mouth daily as needed. Patient takes 1 capful daily of the powder formulation.   metFORMIN 500 MG 24 hr tablet Commonly known as: GLUCOPHAGE-XR TAKE 2 TABLETS BY MOUTH TWICE A DAY   metoprolol succinate 100 MG 24 hr tablet Commonly known as: TOPROL-XL TAKE 1 TABLET BY MOUTH EVERY DAY WITH OR IMMEDIATELY FOLLOWING A MEAL   nitroGLYCERIN 0.4 MG SL tablet Commonly known as: NITROSTAT PLACE 1 TABLET (0.4 MG TOTAL) UNDER THE TONGUE EVERY 5 (FIVE) MINUTES AS NEEDED FOR CHEST PAIN   OneTouch Ultra test strip Generic drug: glucose blood USE TO CHECK BLOOD SUGAR TWICE DAILY   PRESERVISION AREDS PO Take 1 tablet by mouth 2 (two) times daily. Reported on 12/23/2015   QUEtiapine 25 MG tablet Commonly known as: SEROQUEL TAKE 1 TABLET BY MOUTH EVERYDAY AT BEDTIME   rOPINIRole 1 MG tablet Commonly known as: Requip 1/2 tablet during the day, 2 tablets at bedtime   venlafaxine XR 75 MG 24 hr capsule Commonly known as: EFFEXOR-XR TAKE 1 CAPSULE BY MOUTH DAILY WITH BREAKFAST.        Allergies:  Allergies  Allergen Reactions   Hydrocodone-Acetaminophen Other (See Comments)     "Changed personality" "made me very mean"   Percocet [Oxycodone-Acetaminophen] Other (See Comments)    hallucination   Sulfa Antibiotics Other (See Comments)    hallucinations    Family History: Family History  Problem Relation Age of Onset   Breast cancer Mother 25   Heart disease Father 46       sudden onset due to CAD   Hypertension Sister    Dementia Sister    Diabetes Sister    Cancer - Colon Sister 10   Colon cancer Sister    GER disease Daughter    Hypertension Daughter    Heart disease Brother    Hypertension Brother    Heart attack Neg Hx    Stroke Neg Hx    Esophageal cancer Neg Hx    Rectal cancer Neg Hx    Stomach cancer Neg Hx     Social History:  reports that she has never smoked. She has never used smokeless tobacco. She reports current alcohol use of about 1.0 standard drink of alcohol per week. She reports that she does not use drugs.  ROS:                                        Physical Exam: BP (!) 143/72   Pulse 71   Ht 5\' 3"  (1.6 m)   Wt 59.6 kg   BMI 23.27 kg/m   Constitutional:  Alert and oriented, No acute distress. HEENT: [place] AT, moist mucus membranes.  Trachea midline, no masses.  Laboratory Data: Lab Results  Component Value Date   WBC 7.2 09/12/2022   HGB 11.6 (L) 09/12/2022   HCT 35.3 (L) 09/12/2022   MCV 93.5 09/12/2022   PLT 284.0 09/12/2022    Lab Results  Component Value Date   CREATININE 0.92 09/12/2022    No results found for: "PSA"  No results found for: "TESTOSTERONE"  Lab Results  Component Value Date   HGBA1C 7.7 (H) 02/21/2023    Urinalysis    Component Value Date/Time  COLORURINE YELLOW 07/01/2013 1247   APPEARANCEUR Clear 05/14/2023 1107   LABSPEC 1.022 07/01/2013 1247   PHURINE 6.0 07/01/2013 1247   GLUCOSEU Negative 05/14/2023 1107   HGBUR NEGATIVE 07/01/2013 1247   BILIRUBINUR Negative 05/14/2023 1107   KETONESUR NEGATIVE 07/01/2013 1247   PROTEINUR Negative 05/14/2023  1107   PROTEINUR NEGATIVE 07/01/2013 1247   UROBILINOGEN 0.2 07/08/2020 1109   UROBILINOGEN 1.0 07/01/2013 1247   NITRITE Negative 05/14/2023 1107   NITRITE NEGATIVE 07/01/2013 1247   LEUKOCYTESUR Negative 05/14/2023 1107    Pertinent Imaging:   Assessment & Plan: Patient has tried 3 medications for mild overactive bladder.  Role of percutaneous tibial nerve stimulation and physical therapy also discussed.  She would like to do percutaneous tibial nerve stimulation and handout given.  She will stay on the [facility] and can always go off it if she does really well.  1. Mixed incontinence  - Urinalysis, Complete   No follow-ups on file.  Martina Sinner, MD  [facility] [address] [place], [place] [address] [phone]

## 2023-07-03 ENCOUNTER — Ambulatory Visit: Payer: Self-pay | Admitting: Clinical

## 2023-07-09 ENCOUNTER — Other Ambulatory Visit: Payer: Self-pay | Admitting: Urology

## 2023-07-09 ENCOUNTER — Other Ambulatory Visit: Payer: Self-pay

## 2023-07-09 ENCOUNTER — Encounter: Payer: Self-pay | Admitting: Urology

## 2023-07-09 DIAGNOSIS — N3941 Urge incontinence: Secondary | ICD-10-CM

## 2023-07-09 MED ORDER — GEMTESA 75 MG PO TABS
75.0000 mg | ORAL_TABLET | Freq: Every day | ORAL | 11 refills | Status: DC
Start: 2023-07-09 — End: 2024-02-28
  Filled 2023-07-09: qty 30, 30d supply, fill #0
  Filled 2023-08-06: qty 30, 30d supply, fill #1
  Filled 2023-09-05: qty 30, 30d supply, fill #2
  Filled 2023-10-22: qty 30, 30d supply, fill #3
  Filled 2024-02-05: qty 30, 30d supply, fill #4

## 2023-07-09 NOTE — Telephone Encounter (Signed)
Pt needs refill for [facility] sent to [facility] on [address]

## 2023-07-10 ENCOUNTER — Other Ambulatory Visit: Payer: Self-pay

## 2023-07-12 ENCOUNTER — Ambulatory Visit: Payer: Medicare PPO | Admitting: Family Medicine

## 2023-07-12 ENCOUNTER — Encounter: Payer: Self-pay | Admitting: Family Medicine

## 2023-07-12 VITALS — BP 160/70 | HR 66 | Temp 98.8°F | Ht 61.5 in | Wt 132.4 lb

## 2023-07-12 DIAGNOSIS — N3946 Mixed incontinence: Secondary | ICD-10-CM

## 2023-07-12 DIAGNOSIS — M7072 Other bursitis of hip, left hip: Secondary | ICD-10-CM | POA: Diagnosis not present

## 2023-07-12 MED ORDER — PREDNISONE 20 MG PO TABS
ORAL_TABLET | ORAL | 0 refills | Status: AC
Start: 2023-07-12 — End: ?

## 2023-07-12 NOTE — Progress Notes (Signed)
Patient ID: [name], female    DOB: 1941-10-22, 82 y.o.   MRN: [id number]  This visit was conducted in person.  BP (!) 160/70 (BP Location: Right Arm, Patient Position: Sitting, Cuff Size: Normal)   Pulse 66   Temp 98.8 F (37.1 C) (Temporal)   Ht 5' 1.5" (1.562 m)   Wt 132 lb 6 oz (60 kg)   SpO2 98%   BMI 24.61 kg/m    CC:  Chief Complaint  Patient presents with   Groin Pain    Left x 1 week    Subjective:   HPI: Winter Hineman is a 82 y.o. female presenting on 07/12/2023 for Groin Pain (Left x 1 week)   New onset left groin pain started  1 week ago. Left anterior hip and lateral hip.  No low back pain.  Started after washing windows , leaning on ladder.  Later that night pain started.. trouble walking.  Using aleve 220 mg BID.[name] no help.   No numbness, left leg feels weak.. limping.   Increase pain with walking and putting weight.   Pain is some better but not resolved.  Reviewed recent urology office visit note from May 14, 2023 for urge incontinence... Cystoscopy performed.  Started on [place]... may have had some results.  She is considering Neuro stimulation      Relevant past medical, surgical, family and social history reviewed and updated as indicated. Interim medical history since our last visit reviewed. Allergies and medications reviewed and updated. Outpatient Medications Prior to Visit  Medication Sig Dispense Refill   Alum & Mag Hydroxide-Simeth (MAALOX REGULAR STRENGTH PO) Take by mouth daily as needed. Patient takes 1 capful daily of the powder formulation.     aspirin EC 81 MG tablet Take 1 tablet (81 mg total) by mouth daily.     atorvastatin (LIPITOR) 80 MG tablet TAKE 1 TABLET BY MOUTH EVERY DAY 90 tablet 3   BLINK TEARS 0.25 % SOLN Place 1 drop into both eyes daily as needed (dry/irritated eyes.).     calcium-vitamin D (OSCAL WITH D) 500-200 MG-UNIT tablet Take 1 tablet by mouth 2 (two) times daily.     chlorthalidone (HYGROTON) 25 MG  tablet TAKE 1 TABLET BY MOUTH EVERY DAY 90 tablet 3   CINNAMON PO Take 1,000 mg by mouth in the morning and at bedtime.     ferrous sulfate 325 (65 FE) MG tablet Take 325 mg by mouth daily.     gabapentin (NEURONTIN) 300 MG capsule TAKE ONE CAPSULE IN THE MORNING, ONE CAPSULE IN THE EVENING AND 3 CAPSULES AT BEDTIME. 150 capsule 0   Lancet Device MISC Use to check blood sugar twice daily. 1 each 0   Lancets (FREESTYLE) lancets Use to check blood sugar twice daily 100 each 11   metFORMIN (GLUCOPHAGE-XR) 500 MG 24 hr tablet TAKE 2 TABLETS BY MOUTH TWICE A DAY 360 tablet 3   methylcellulose (CITRUCEL) oral powder Use as directed daily     metoprolol succinate (TOPROL-XL) 100 MG 24 hr tablet TAKE 1 TABLET BY MOUTH EVERY DAY WITH OR IMMEDIATELY FOLLOWING A MEAL 90 tablet 3   Multiple Vitamins-Minerals (PRESERVISION AREDS PO) Take 1 tablet by mouth 2 (two) times daily. Reported on 12/23/2015     nitroGLYCERIN (NITROSTAT) 0.4 MG SL tablet PLACE 1 TABLET (0.4 MG TOTAL) UNDER THE TONGUE EVERY 5 (FIVE) MINUTES AS NEEDED FOR CHEST PAIN 25 tablet 3   ONETOUCH ULTRA test strip USE TO CHECK BLOOD SUGAR  TWICE DAILY 150 strip 3   Probiotic Product (ALIGN PO) Take by mouth at bedtime.     QUEtiapine (SEROQUEL) 25 MG tablet TAKE 1 TABLET BY MOUTH EVERYDAY AT BEDTIME 90 tablet 1   rOPINIRole (REQUIP) 1 MG tablet 1/2 tablet during the day, 2 tablets at bedtime 90 tablet 11   venlafaxine XR (EFFEXOR-XR) 75 MG 24 hr capsule TAKE 1 CAPSULE BY MOUTH DAILY WITH BREAKFAST. 90 capsule 1   Vibegron (GEMTESA) 75 MG TABS Take 1 tablet (75 mg total) by mouth daily. 30 tablet 11   vitamin B-12 (CYANOCOBALAMIN) 1000 MCG tablet Take 1,000 mcg by mouth daily.     No facility-administered medications prior to visit.     Per HPI unless specifically indicated in ROS section below Review of Systems  Constitutional:  Negative for fatigue and fever.  HENT:  Negative for congestion.   Eyes:  Negative for pain.  Respiratory:   Negative for cough and shortness of breath.   Cardiovascular:  Negative for chest pain, palpitations and leg swelling.  Gastrointestinal:  Negative for abdominal pain.  Genitourinary:  Negative for dysuria and vaginal bleeding.  Musculoskeletal:  Negative for back pain.  Neurological:  Negative for syncope, light-headedness and headaches.  Psychiatric/Behavioral:  Negative for dysphoric mood.    Objective:  BP (!) 160/70 (BP Location: Right Arm, Patient Position: Sitting, Cuff Size: Normal)   Pulse 66   Temp 98.8 F (37.1 C) (Temporal)   Ht 5' 1.5" (1.562 m)   Wt 132 lb 6 oz (60 kg)   SpO2 98%   BMI 24.61 kg/m   Wt Readings from Last 3 Encounters:  07/12/23 132 lb 6 oz (60 kg)  07/02/23 131 lb 6 oz (59.6 kg)  05/14/23 133 lb (60.3 kg)      Physical Exam Constitutional:      General: She is not in acute distress.    Appearance: Normal appearance. She is well-developed. She is not ill-appearing or toxic-appearing.  HENT:     Head: Normocephalic.     Right Ear: Hearing, tympanic membrane, ear canal and external ear normal. Tympanic membrane is not erythematous, retracted or bulging.     Left Ear: Hearing, tympanic membrane, ear canal and external ear normal. Tympanic membrane is not erythematous, retracted or bulging.     Nose: No mucosal edema or rhinorrhea.     Right Sinus: No maxillary sinus tenderness or frontal sinus tenderness.     Left Sinus: No maxillary sinus tenderness or frontal sinus tenderness.     Mouth/Throat:     Mouth: Oropharynx is clear and moist and mucous membranes are normal.     Pharynx: Uvula midline.  Eyes:     General: Lids are normal. Lids are everted, no foreign bodies appreciated.     Extraocular Movements: EOM normal.     Conjunctiva/sclera: Conjunctivae normal.     Pupils: Pupils are equal, round, and reactive to light.  Neck:     Thyroid: No thyroid mass or thyromegaly.     Vascular: No carotid bruit.     Trachea: Trachea normal.   Cardiovascular:     Rate and Rhythm: Normal rate and regular rhythm.     Pulses: Normal pulses.     Heart sounds: Normal heart sounds, S1 normal and S2 normal. No murmur heard.    No friction rub. No gallop.  Pulmonary:     Effort: Pulmonary effort is normal. No tachypnea or respiratory distress.     Breath sounds: Normal breath  sounds. No decreased breath sounds, wheezing, rhonchi or rales.  Abdominal:     General: Bowel sounds are normal.     Palpations: Abdomen is soft.     Tenderness: There is no abdominal tenderness.  Musculoskeletal:     Cervical back: Normal range of motion and neck supple.     Lumbar back: Normal. No tenderness. Normal range of motion.     Right hip: Normal.     Left hip: Tenderness and bony tenderness present. No crepitus. Decreased range of motion.     Comments: Most tender to palpation laterally over hip bursa,  Negative Faber's.  Skin:    General: Skin is warm, dry and intact.     Findings: No rash.  Neurological:     Mental Status: She is alert.  Psychiatric:        Mood and Affect: Mood is not anxious or depressed.        Speech: Speech normal.        Behavior: Behavior normal. Behavior is cooperative.        Thought Content: Thought content normal.        Cognition and Memory: Cognition and memory normal.        Judgment: Judgment normal.       Results for orders placed or performed in visit on 07/02/23  Microscopic Examination   Urine  Result Value Ref Range   WBC, UA 11-30 (A) 0 - 5 /hpf   RBC, Urine 0-2 0 - 2 /hpf   Epithelial Cells (non renal) 0-10 0 - 10 /hpf   Bacteria, UA Moderate (A) None seen/Few  Urinalysis, Complete  Result Value Ref Range   Specific Gravity, UA 1.020 1.005 - 1.030   pH, UA 5.5 5.0 - 7.5   Color, UA Yellow Yellow   Appearance Ur Clear Clear   Leukocytes,UA 2+ (A) Negative   Protein,UA Negative Negative/Trace   Glucose, UA Negative Negative   Ketones, UA Negative Negative   RBC, UA Negative Negative    Bilirubin, UA Negative Negative   Urobilinogen, Ur 0.2 0.2 - 1.0 mg/dL   Nitrite, UA Negative Negative   Microscopic Examination See below:    *Note: Due to a large number of results and/or encounters for the requested time period, some results have not been displayed. A complete set of results can be found in Results Review.    Assessment and Plan  Bursitis of left hip, unspecified bursa Assessment & Plan: Acute, minimal improvement with over-the-counter anti-inflammatory. No clear involvement of sciatica and back issues. Acute hip bursitis most likely given tenderness to palpation laterally.  Less likely possible osteoarthritis.  No clear indication for x-ray at today's office visit. Start with prednisone taper, ice and home physical therapy.  Patient does have a referral from her neurologist for physical therapy in process.  If not improving in the next 2 to 4 weeks she will call for possible x-rays/possible steroid injection with sports medicine.   Mixed incontinence Assessment & Plan: Chronic, slight improvement with Gemtesa. Recommended timed bathroom breaks and not holding urine. Considering neurostimulator. Followed by urology   Other orders -     predniSONE; 3 tabs by mouth daily x 3 days, then 2 tabs by mouth daily x 2 days then 1 tab by mouth daily x 2 days  Dispense: 15 tablet; Refill: 0    No follow-ups on file.   Kerby Nora, MD

## 2023-07-12 NOTE — Patient Instructions (Signed)
Complete prednisone taper.  Start home PT, ice on lateral hip.  Call for possible  X-ray/steroid injection  if not improving in 2-4 weeks.

## 2023-07-12 NOTE — Assessment & Plan Note (Signed)
Chronic, slight improvement with Gemtesa. Recommended timed bathroom breaks and not holding urine. Considering neurostimulator. Followed by urology

## 2023-07-12 NOTE — Assessment & Plan Note (Signed)
Acute, minimal improvement with over-the-counter anti-inflammatory. No clear involvement of sciatica and back issues. Acute hip bursitis most likely given tenderness to palpation laterally.  Less likely possible osteoarthritis.  No clear indication for x-ray at today's office visit. Start with prednisone taper, ice and home physical therapy.  Patient does have a referral from her neurologist for physical therapy in process.  If not improving in the next 2 to 4 weeks she will call for possible x-rays/possible steroid injection with sports medicine.

## 2023-07-17 ENCOUNTER — Ambulatory Visit: Payer: Medicare PPO | Admitting: Clinical

## 2023-07-31 DIAGNOSIS — R269 Unspecified abnormalities of gait and mobility: Secondary | ICD-10-CM | POA: Diagnosis not present

## 2023-08-02 ENCOUNTER — Other Ambulatory Visit: Payer: Self-pay | Admitting: Family Medicine

## 2023-08-02 NOTE — Telephone Encounter (Signed)
Last office visit 08/12/2023 for Bursitis Left Hip.  Last refilled 06/05/23 for #150 with no refills.  Next Appt: No future appointments with PCP.

## 2023-08-06 ENCOUNTER — Other Ambulatory Visit: Payer: Self-pay

## 2023-08-07 DIAGNOSIS — R269 Unspecified abnormalities of gait and mobility: Secondary | ICD-10-CM | POA: Diagnosis not present

## 2023-08-10 DIAGNOSIS — R269 Unspecified abnormalities of gait and mobility: Secondary | ICD-10-CM | POA: Diagnosis not present

## 2023-08-14 ENCOUNTER — Ambulatory Visit: Payer: Medicare PPO | Admitting: Primary Care

## 2023-08-14 ENCOUNTER — Encounter: Payer: Self-pay | Admitting: Primary Care

## 2023-08-14 ENCOUNTER — Ambulatory Visit
Admission: RE | Admit: 2023-08-14 | Discharge: 2023-08-14 | Disposition: A | Discharge status: Home / Self Care | Payer: Medicare PPO | Source: Ambulatory Visit | Attending: Primary Care | Admitting: Primary Care

## 2023-08-14 VITALS — BP 130/86 | HR 60 | Temp 97.6°F | Ht 61.5 in | Wt 132.0 lb

## 2023-08-14 DIAGNOSIS — T63421A Toxic effect of venom of ants, accidental (unintentional), initial encounter: Secondary | ICD-10-CM

## 2023-08-14 DIAGNOSIS — R519 Headache, unspecified: Secondary | ICD-10-CM | POA: Diagnosis not present

## 2023-08-14 DIAGNOSIS — G44319 Acute post-traumatic headache, not intractable: Secondary | ICD-10-CM | POA: Diagnosis not present

## 2023-08-14 DIAGNOSIS — R269 Unspecified abnormalities of gait and mobility: Secondary | ICD-10-CM | POA: Diagnosis not present

## 2023-08-14 DIAGNOSIS — I6782 Cerebral ischemia: Secondary | ICD-10-CM | POA: Diagnosis not present

## 2023-08-14 DIAGNOSIS — G9389 Other specified disorders of brain: Secondary | ICD-10-CM | POA: Diagnosis not present

## 2023-08-14 MED ORDER — METHYLPREDNISOLONE ACETATE 80 MG/ML IJ SUSP
80.0000 mg | Freq: Once | INTRAMUSCULAR | Status: AC
Start: 2023-08-14 — End: 2023-08-14
  Administered 2023-08-14: 80 mg via INTRAMUSCULAR

## 2023-08-14 NOTE — Assessment & Plan Note (Addendum)
HPI and exam consistent with ant bites without complication such as cellulitis. Overall improving.  Depo-Medrol 80 mg IM provided today.  Discussed continuing supportive care measures.  She will update if no improvement.   I evaluated patient, was consulted regarding treatment, and agree with assessment and plan per Tenna Delaine, RN, DNP student.   Mayra Reel, NP-C

## 2023-08-14 NOTE — Assessment & Plan Note (Addendum)
Ongoing. Neuro exam reassuring.   Given trauma in the setting of aspirin use, Stat head CT without contrast ordered.   We will be in touch with the results.   I evaluated patient, was consulted regarding treatment, and agree with assessment and plan per Tenna Delaine, RN, DNP student.   Mayra Reel, NP-C

## 2023-08-14 NOTE — Patient Instructions (Signed)
Continue Aleve, icing, elevation, and rest for your fire ant bites.   You received a steroid shot today for inflammation.   You will get a phone call today about getting your head CT scan scheduled  We will be in touch with the results.  It was a pleasure to see you today!

## 2023-08-14 NOTE — Progress Notes (Signed)
Subjective:    Patient ID: Morgan [name], female    DOB: 06-29-1941, 82 y.o.   MRN: [id number]  HPI  Morgan [name] is a very pleasant 82 y.o. female patient of [name] with a history of who presents today   Two days ago she was moving boxes, walked across the grass, fire ants attacked her legs so she got the water hose and sprayed them off.   Since then she's noticed bilateral erythema, swelling, and pain to the bilateral feet (including toes and ankles), left > right. She describes her pain as stinging, also with itching, and increased numbness to the plantar feet.   She then lost her balance from fighting off the ants, fell backwards, hitting her right occipital head against the side walk. She was initially disoriented with double vision, required help getting up, has noticed headaches to the right occipital region and sometimes parietal lobes since.   She is managed on aspirin 81 mg daily for paroxymal atrial fibrillation.   Review of Systems  Eyes:  Negative for visual disturbance.  Respiratory:  Negative for shortness of breath.   Cardiovascular:  Negative for chest pain.  Skin:  Positive for color change.  Neurological:  Positive for headaches.         Past Medical History:  Diagnosis Date   Angina    Arthritis    Atrial fibrillation (HCC)    ASPIRIN FOR BLOOD THINNER   Atypical mole 09/22/2016   Bulging discs    lumbar    Complication of anesthesia    pt states has choking sensation with ET tube    Coronary artery disease    Depression    Diabetes mellitus    diet controlled/on meds   Family history of breast cancer    Family history of colon cancer    Fibromyalgia    GERD (gastroesophageal reflux disease)    H/O hiatal hernia    Headache(784.0)    "recurring"   High cholesterol    History of colon polyps    Hyperlipidemia    Hypertension    Jackhammer esophagus    Melanoma (HCC)    melanoma   Migraines    "til ~ 1980"   PONV (postoperative  nausea and vomiting)    Restless leg syndrome    Sciatic nerve pain    "from pinched nerve"   Sleep apnea    uses CPAP   Stroke (HCC) 2014   no deficits   Weakness of right side of body    "I've had PT for it; they don't know what it's from".  CORTISONE INJECTION INTO BACK 08/30/12    Social History   Socioeconomic History   Marital status: Married    Spouse name: Not on file   Number of children: 2   Years of education: college   Highest education level: Not on file  Occupational History   Occupation: Retired   Tobacco Use   Smoking status: Never   Smokeless tobacco: Never  Vaping Use   Vaping status: Never Used  Substance and Sexual Activity   Alcohol use: Yes    Alcohol/week: 1.0 standard drink of alcohol    Types: 1 Glasses of wine per week    Comment: "occasionally drink wine"   Drug use: No   Sexual activity: Yes  Other Topics Concern   Not on file  Social History Narrative   Drinks 1 cup of coffee a day    Social Determinants of Health  Financial Resource Strain: Low Risk  (02/28/2023)   Overall Financial Resource Strain (CARDIA)    Difficulty of Paying Living Expenses: Not hard at all  Food Insecurity: No Food Insecurity (02/28/2023)   Hunger Vital Sign    Worried About Running Out of Food in the Last Year: Never true    Ran Out of Food in the Last Year: Never true  Transportation Needs: No Transportation Needs (02/28/2023)   PRAPARE - Administrator, Civil Service (Medical): No    Lack of Transportation (Non-Medical): No  Physical Activity: Inactive (02/28/2023)   Exercise Vital Sign    Days of Exercise per Week: 0 days    Minutes of Exercise per Session: 0 min  Stress: No Stress Concern Present (02/28/2023)   [employer] of Occupational Health - Occupational Stress Questionnaire    Feeling of Stress : Not at all  Social Connections: Socially Integrated (02/28/2023)   Social Connection and Isolation Panel [NHANES]    Frequency of  Communication with Friends and Family: More than three times a week    Frequency of Social Gatherings with Friends and Family: More than three times a week    Attends Religious Services: More than 4 times per year    Active Member of Clubs or Organizations: Yes    Attends Banker Meetings: More than 4 times per year    Marital Status: Married  Catering manager Violence: Not At Risk (02/28/2023)   Humiliation, Afraid, Rape, and Kick questionnaire    Fear of Current or Ex-Partner: No    Emotionally Abused: No    Physically Abused: No    Sexually Abused: No    Past Surgical History:  Procedure Laterality Date   63 HOUR PH STUDY N/A 12/29/2015   Procedure: 24 HOUR PH STUDY;  Surgeon: Ruffin Frederick, MD;  Location: [facility];  Service: Gastroenterology;  Laterality: N/A;   CARPAL TUNNEL RELEASE Left 07/02/2015   Procedure: CARPAL TUNNEL RELEASE;  Surgeon: Gean Birchwood, MD;  Location: [facility];  Service: Orthopedics;  Laterality: Left;   CATARACT EXTRACTION, BILATERAL Bilateral    Oct and Nov 2017   COLONOSCOPY  06/30/2019   CORONARY ANGIOPLASTY WITH STENT PLACEMENT  06/2011   "1"   CORONARY ARTERY BYPASS GRAFT  2005   CABG X 2   DILATION AND CURETTAGE OF UTERUS     "more than once"   ELBOW ARTHROSCOPY Left 07/02/2015   Procedure: ARTHROSCOPY LEFT ELBOW WITH DEBRIDEMENT AND REMOVAL LOOSE BODY;  Surgeon: Gean Birchwood, MD;  Location: [facility];  Service: Orthopedics;  Laterality: Left;   ESOPHAGEAL DILATION  01/21/2020   Procedure: ESOPHAGEAL DILATION;  Surgeon: Shellia Cleverly, DO;  Location: [facility];  Service: Gastroenterology;;   ESOPHAGEAL MANOMETRY N/A 12/29/2015   Procedure: ESOPHAGEAL MANOMETRY (EM);  Surgeon: Ruffin Frederick, MD;  Location: [facility];  Service: Gastroenterology;  Laterality: N/A;   ESOPHAGEAL MANOMETRY N/A 07/30/2019   Procedure: ESOPHAGEAL MANOMETRY (EM);  Surgeon: Benancio Deeds, MD;   Location: [facility];  Service: Gastroenterology;  Laterality: N/A;   ESOPHAGOGASTRODUODENOSCOPY (EGD) WITH PROPOFOL N/A 01/21/2020   Procedure: ESOPHAGOGASTRODUODENOSCOPY (EGD) WITH PROPOFOL;  Surgeon: Shellia Cleverly, DO;  Location: [facility];  Service: Gastroenterology;  Laterality: N/A;   FRACTURE SURGERY  ~ 2005   nose   KNEE ARTHROSCOPY  09/04/2012   Procedure: ARTHROSCOPY KNEE;  Surgeon: Loanne Drilling, MD;  Location: [facility];  Service: Orthopedics;  Laterality: Right;  right knee  arthroscopy with medial and lateral meniscus debridement   MELANOMA EXCISION Right 10/13/2016   right side of neck   MOUTH SURGERY  2004   "bone replacement; had cadavear bones put in; face was collapsing"   NASAL SEPTUM SURGERY  ~ 1986   TOTAL KNEE ARTHROPLASTY Right 07/07/2013   Procedure: RIGHT TOTAL KNEE ARTHROPLASTY;  Surgeon: Loanne Drilling, MD;  Location: [facility];  Service: Orthopedics;  Laterality: Right;   TRANSORAL INCISIONLESS FUNDOPLICATION N/A 01/21/2020   Procedure: TRANSORAL INCISIONLESS FUNDOPLICATION;  Surgeon: Shellia Cleverly, DO;  Location: [facility];  Service: Gastroenterology;  Laterality: N/A;   UPPER GASTROINTESTINAL ENDOSCOPY      Family History  Problem Relation Age of Onset   Breast cancer Mother 31   Heart disease Father 34       sudden onset due to CAD   Hypertension Sister    Dementia Sister    Diabetes Sister    Cancer - Colon Sister 17   Colon cancer Sister    GER disease Daughter    Hypertension Daughter    Heart disease Brother    Hypertension Brother    Heart attack Neg Hx    Stroke Neg Hx    Esophageal cancer Neg Hx    Rectal cancer Neg Hx    Stomach cancer Neg Hx     Allergies  Allergen Reactions   Hydrocodone-Acetaminophen Other (See Comments)    "Changed personality" "made me very mean"   Percocet [Oxycodone-Acetaminophen] Other (See Comments)    hallucination   Sulfa Antibiotics Other (See Comments)    hallucinations    Current  Outpatient Medications on File Prior to Visit  Medication Sig Dispense Refill   Alum & Mag Hydroxide-Simeth (MAALOX REGULAR STRENGTH PO) Take by mouth daily as needed. Patient takes 1 capful daily of the powder formulation.     aspirin EC 81 MG tablet Take 1 tablet (81 mg total) by mouth daily.     atorvastatin (LIPITOR) 80 MG tablet TAKE 1 TABLET BY MOUTH EVERY DAY 90 tablet 3   BLINK TEARS 0.25 % SOLN Place 1 drop into both eyes daily as needed (dry/irritated eyes.).     calcium-vitamin D (OSCAL WITH D) 500-200 MG-UNIT tablet Take 1 tablet by mouth 2 (two) times daily.     chlorthalidone (HYGROTON) 25 MG tablet TAKE 1 TABLET BY MOUTH EVERY DAY 90 tablet 3   CINNAMON PO Take 1,000 mg by mouth in the morning and at bedtime.     ferrous sulfate 325 (65 FE) MG tablet Take 325 mg by mouth daily.     gabapentin (NEURONTIN) 300 MG capsule TAKE ONE CAPSULE IN THE MORNING, ONE CAPSULE IN THE EVENING AND 3 CAPSULES AT BEDTIME. 150 capsule 0   Lancet Device MISC Use to check blood sugar twice daily. 1 each 0   Lancets (FREESTYLE) lancets Use to check blood sugar twice daily 100 each 11   metFORMIN (GLUCOPHAGE-XR) 500 MG 24 hr tablet TAKE 2 TABLETS BY MOUTH TWICE A DAY 360 tablet 3   methylcellulose (CITRUCEL) oral powder Use as directed daily     metoprolol succinate (TOPROL-XL) 100 MG 24 hr tablet TAKE 1 TABLET BY MOUTH EVERY DAY WITH OR IMMEDIATELY FOLLOWING A MEAL 90 tablet 3   Multiple Vitamins-Minerals (PRESERVISION AREDS PO) Take 1 tablet by mouth 2 (two) times daily. Reported on 12/23/2015     nitroGLYCERIN (NITROSTAT) 0.4 MG SL tablet PLACE 1 TABLET (0.4 MG TOTAL) UNDER THE TONGUE EVERY 5 (FIVE) MINUTES  AS NEEDED FOR CHEST PAIN 25 tablet 3   ONETOUCH ULTRA test strip USE TO CHECK BLOOD SUGAR TWICE DAILY 150 strip 3   Probiotic Product (ALIGN PO) Take by mouth at bedtime.     QUEtiapine (SEROQUEL) 25 MG tablet TAKE 1 TABLET BY MOUTH EVERYDAY AT BEDTIME 90 tablet 1   rOPINIRole (REQUIP) 1 MG tablet  1/2 tablet during the day, 2 tablets at bedtime 90 tablet 11   venlafaxine XR (EFFEXOR-XR) 75 MG 24 hr capsule TAKE 1 CAPSULE BY MOUTH DAILY WITH BREAKFAST. 90 capsule 1   Vibegron (GEMTESA) 75 MG TABS Take 1 tablet (75 mg total) by mouth daily. 30 tablet 11   vitamin B-12 (CYANOCOBALAMIN) 1000 MCG tablet Take 1,000 mcg by mouth daily.     No current facility-administered medications on file prior to visit.    BP 130/86   Pulse 60   Temp 97.6 F (36.4 C) (Oral)   Ht 5' 1.5" (1.562 m)   Wt 132 lb (59.9 kg)   SpO2 98%   BMI 24.54 kg/m  Objective:   Physical Exam Cardiovascular:     Rate and Rhythm: Normal rate and regular rhythm.     Pulses:          Dorsalis pedis pulses are 2+ on the right side and 2+ on the left side.       Posterior tibial pulses are 2+ on the right side and 2+ on the left side.  Musculoskeletal:     Cervical back: Neck supple.  Skin:    General: Skin is warm and dry.     Findings: Erythema present.     Comments: Moderate swelling with mild erythema to left dorsal foot and ankle with several ant bites. No warmth, drainage.   Neurological:     Mental Status: She is alert and oriented to person, place, and time.  Psychiatric:        Mood and Affect: Mood normal.           Assessment & Plan:  Acute post-traumatic headache, not intractable Assessment & Plan: Ongoing.  Neuro exam reassuring.   Stat head CT without contrast ordered. She will get a phone call today about getting head CT scheduled.  We will be in touch with the results.   Orders: -     CT HEAD WO CONTRAST ( ); Future  Fire ant bite, accidental or unintentional, initial encounter Assessment & Plan: HPI and exam consistent with ant bites. Overall improving.  Depo-Medrol 80 mg IM provided today.  Discussed continuing supportive care measures.  She will update if no improvement.   Orders: -     methylPREDNISolone Acetate        Doreene Nest, NP

## 2023-08-14 NOTE — Progress Notes (Signed)
Acute Office Visit  Subjective:     Patient ID: Morgan [name], female    DOB: 1940/11/29, 82 y.o.   MRN: [id number]  Chief Complaint  Patient presents with   Insect Bite    Was bitten on both ankles by fire ants on Sunday.    HPI  Morgan [name] is a very pleasant 82 y.o. female patient of Kerby Nora MD with a history of PAF, CAD, hypertension, type 2 diabetes who presents today for fire ant bites.   2 days ago she was carrying boxes across the yard when fire ants bit both ankles and feet. She washed them off with a water hose and they did not bite her anywhere else. Bilateral ankle and dorsal surface noted to have erythematous small bites. Left ankle and dorsal surface with edema. The edema has decreased over the last day but remains. Her pain is 4/10 today, which is improved from 2 days ago. The bites are itching and stinging. She has tried Aleve, icing, and elevation.   She also fell and hit her head on the concrete sidewalk 2 days ago in right posterior occipital region. This occurred while holding heavy moving boxes and getting bitten by fire ants. She did not lose consciousness, but did require help getting up. She also had blurry vision when first sitting up, which resolved spontaneously. She had a "goose egg" that has now resolved. Since the fall she has experienced daily headaches to right posterior occipital region and occasionally bilateral temporal headaches.   Review of Systems  HENT:  Negative for hearing loss and nosebleeds.   Eyes:  Negative for blurred vision and double vision.  Respiratory:  Negative for shortness of breath.   Cardiovascular:  Negative for chest pain.  Musculoskeletal:  Positive for falls.       Mechanical fall 2 days ago  Skin:  Positive for itching.       Bilateral feet  Neurological:  Positive for headaches. Negative for dizziness and weakness.      Objective:    BP 130/86   Pulse 60   Temp 97.6 F (36.4 C) (Oral)   Ht 5' 1.5" (1.562  m)   Wt 132 lb (59.9 kg)   SpO2 98%   BMI 24.54 kg/m   BP Readings from Last 3 Encounters:  08/14/23 130/86  07/12/23 (!) 160/70  07/02/23 (!) 143/72   Wt Readings from Last 3 Encounters:  08/14/23 132 lb (59.9 kg)  07/12/23 132 lb 6 oz (60 kg)  07/02/23 131 lb 6 oz (59.6 kg)     Physical Exam HENT:     Head: Normocephalic and atraumatic.  Cardiovascular:     Rate and Rhythm: Normal rate and regular rhythm.     Pulses: Normal pulses.     Heart sounds: Normal heart sounds.  Pulmonary:     Effort: Pulmonary effort is normal.  Skin:    General: Skin is warm.     Findings: Erythema present.     Comments: Insect appearing bites noted to bilateral dorsal surface of feet and left lateral ankle  Neurological:     General: No focal deficit present.     Mental Status: She is alert.    No results found for any visits on 08/14/23.     Assessment & Plan:   Problem List Items Addressed This Visit       Other   Fire ant bite    HPI and exam consistent with ant bites without complication such  as cellulitis. Overall improving.  Depo-Medrol 80 mg IM provided today.  Discussed continuing supportive care measures.  She will update if no improvement.   I evaluated patient, was consulted regarding treatment, and agree with assessment and plan per Tenna Delaine, RN, DNP student.   Mayra Reel, NP-C       Acute post-traumatic headache, not intractable - Primary    Ongoing. Neuro exam reassuring.   Given trauma in the setting of aspirin use, Stat head CT without contrast ordered.   We will be in touch with the results.   I evaluated patient, was consulted regarding treatment, and agree with assessment and plan per Tenna Delaine, RN, DNP student.   Mayra Reel, NP-C       Relevant Orders   CT HEAD WO CONTRAST ( )    Meds ordered this encounter  Medications   methylPREDNISolone acetate (DEPO-MEDROL) injection 80 mg    No follow-ups on file.  Benito Mccreedy, RN

## 2023-08-14 NOTE — Progress Notes (Deleted)
Acute Office Visit  Subjective:     Patient ID: Morgan [name], female    DOB: 1941/09/30, 82 y.o.   MRN: [id number]  Chief Complaint  Patient presents with   Insect Bite    Was bitten on both ankles by fire ants on Sunday.    HPI  Morgan [name] is a very pleasant 82 y.o. female patient of Kerby Nora MD with a history of PAF, CAD, hypertension, type 2 diabetes who presents today for fire ant bites.   2 days ago she was carrying boxes across the yard when fire ants bit both ankles and feet. She washed them off with a water hose and they did not bite her anywhere else. Bilateral ankle and dorsal surface noted to have erythematous small bites. Left ankle and dorsal surface with edema. The edema has decreased over the last day but remains. Her pain is 4/10 today, which is improved from 2 days ago. The bites are itching and stinging. She has tried Aleve, icing, and elevation.   She also fell and hit her head on the concrete sidewalk 2 days ago in right posterior occipital region. This occurred while holding heavy moving boxes and getting bitten by fire ants. She did not lose consciousness, but did require help getting up. She also had blurry vision when first sitting up, which resolved spontaneously. She had a "goose egg" that has now resolved. Since the fall she has experienced daily headaches to right posterior occipital region and occasionally bilateral temporal headaches.    Depo-Medrol 80 mg IM -  Stat head CT wo contrast - tell patient she will get a phone call today about getting head CT scheduled   Review of Systems  HENT:  Negative for hearing loss and nosebleeds.   Eyes:  Negative for blurred vision and double vision.  Respiratory:  Negative for shortness of breath.   Cardiovascular:  Negative for chest pain.  Musculoskeletal:  Positive for falls.       Mechanical fall 2 days ago  Skin:  Positive for itching.       Bilateral dorsal surface of the feet  Neurological:   Positive for headaches. Negative for dizziness and weakness.       Objective:    BP 130/86   Pulse 60   Temp 97.6 F (36.4 C) (Oral)   Ht 5' 1.5" (1.562 m)   Wt 132 lb (59.9 kg)   SpO2 98%   BMI 24.54 kg/m   BP Readings from Last 3 Encounters:  08/14/23 130/86  07/12/23 (!) 160/70  07/02/23 (!) 143/72   Wt Readings from Last 3 Encounters:  08/14/23 132 lb (59.9 kg)  07/12/23 132 lb 6 oz (60 kg)  07/02/23 131 lb 6 oz (59.6 kg)      No results found for any visits on 08/14/23.      Assessment & Plan:   Problem List Items Addressed This Visit   None   No orders of the defined types were placed in this encounter.   No follow-ups on file.  Benito Mccreedy, RN

## 2023-08-17 DIAGNOSIS — R269 Unspecified abnormalities of gait and mobility: Secondary | ICD-10-CM | POA: Diagnosis not present

## 2023-08-21 ENCOUNTER — Other Ambulatory Visit: Payer: Self-pay | Admitting: Family Medicine

## 2023-08-21 ENCOUNTER — Encounter: Payer: Self-pay | Admitting: Family Medicine

## 2023-08-21 DIAGNOSIS — R269 Unspecified abnormalities of gait and mobility: Secondary | ICD-10-CM | POA: Diagnosis not present

## 2023-08-21 NOTE — Telephone Encounter (Signed)
Detrol was d/c by another office. Do we need to refill?

## 2023-08-22 ENCOUNTER — Other Ambulatory Visit: Payer: Self-pay | Admitting: Family Medicine

## 2023-08-23 DIAGNOSIS — R269 Unspecified abnormalities of gait and mobility: Secondary | ICD-10-CM | POA: Diagnosis not present

## 2023-08-23 NOTE — Telephone Encounter (Signed)
LAST APPOINTMENT DATE: 07/12/23 acute issues    NEXT APPOINTMENT DATE: Visit date not found    LAST REFILL: 08/03/22  QTY: #[phone]

## 2023-08-23 NOTE — Telephone Encounter (Signed)
Left message to return call to our office.  

## 2023-08-24 NOTE — Telephone Encounter (Signed)
Left message to return call to our office.  

## 2023-08-28 DIAGNOSIS — R269 Unspecified abnormalities of gait and mobility: Secondary | ICD-10-CM | POA: Diagnosis not present

## 2023-08-29 DIAGNOSIS — R269 Unspecified abnormalities of gait and mobility: Secondary | ICD-10-CM | POA: Diagnosis not present

## 2023-09-04 DIAGNOSIS — R269 Unspecified abnormalities of gait and mobility: Secondary | ICD-10-CM | POA: Diagnosis not present

## 2023-09-05 ENCOUNTER — Other Ambulatory Visit: Payer: Self-pay

## 2023-09-06 DIAGNOSIS — R269 Unspecified abnormalities of gait and mobility: Secondary | ICD-10-CM | POA: Diagnosis not present

## 2023-09-13 DIAGNOSIS — R269 Unspecified abnormalities of gait and mobility: Secondary | ICD-10-CM | POA: Diagnosis not present

## 2023-09-18 DIAGNOSIS — R269 Unspecified abnormalities of gait and mobility: Secondary | ICD-10-CM | POA: Diagnosis not present

## 2023-09-21 DIAGNOSIS — R269 Unspecified abnormalities of gait and mobility: Secondary | ICD-10-CM | POA: Diagnosis not present

## 2023-09-27 DIAGNOSIS — R269 Unspecified abnormalities of gait and mobility: Secondary | ICD-10-CM | POA: Diagnosis not present

## 2023-10-02 DIAGNOSIS — R269 Unspecified abnormalities of gait and mobility: Secondary | ICD-10-CM | POA: Diagnosis not present

## 2023-10-04 DIAGNOSIS — R269 Unspecified abnormalities of gait and mobility: Secondary | ICD-10-CM | POA: Diagnosis not present

## 2023-10-05 DIAGNOSIS — Z87820 Personal history of traumatic brain injury: Secondary | ICD-10-CM | POA: Diagnosis not present

## 2023-10-05 DIAGNOSIS — G4489 Other headache syndrome: Secondary | ICD-10-CM | POA: Diagnosis not present

## 2023-10-05 DIAGNOSIS — R55 Syncope and collapse: Secondary | ICD-10-CM | POA: Diagnosis not present

## 2023-10-05 DIAGNOSIS — R269 Unspecified abnormalities of gait and mobility: Secondary | ICD-10-CM | POA: Diagnosis not present

## 2023-10-05 DIAGNOSIS — G44321 Chronic post-traumatic headache, intractable: Secondary | ICD-10-CM | POA: Diagnosis not present

## 2023-10-05 DIAGNOSIS — W19XXXD Unspecified fall, subsequent encounter: Secondary | ICD-10-CM | POA: Diagnosis not present

## 2023-10-05 DIAGNOSIS — G2581 Restless legs syndrome: Secondary | ICD-10-CM | POA: Diagnosis not present

## 2023-10-05 DIAGNOSIS — Z0189 Encounter for other specified special examinations: Secondary | ICD-10-CM | POA: Diagnosis not present

## 2023-10-05 DIAGNOSIS — R2689 Other abnormalities of gait and mobility: Secondary | ICD-10-CM | POA: Diagnosis not present

## 2023-10-05 DIAGNOSIS — M2170 Unequal limb length (acquired), unspecified site: Secondary | ICD-10-CM | POA: Diagnosis not present

## 2023-10-09 DIAGNOSIS — G4489 Other headache syndrome: Secondary | ICD-10-CM | POA: Diagnosis not present

## 2023-10-09 DIAGNOSIS — R269 Unspecified abnormalities of gait and mobility: Secondary | ICD-10-CM | POA: Diagnosis not present

## 2023-10-16 DIAGNOSIS — R269 Unspecified abnormalities of gait and mobility: Secondary | ICD-10-CM | POA: Diagnosis not present

## 2023-10-19 ENCOUNTER — Other Ambulatory Visit: Payer: Self-pay | Admitting: Family Medicine

## 2023-10-19 DIAGNOSIS — R269 Unspecified abnormalities of gait and mobility: Secondary | ICD-10-CM | POA: Diagnosis not present

## 2023-10-19 NOTE — Telephone Encounter (Signed)
Last office visit 08/23/2023  with Morgan [name] for HA and insect bite. Last refilled 08/02/2023 for #150 with no refills.  Next Appt: No future appointments with PCP.

## 2023-10-22 ENCOUNTER — Other Ambulatory Visit: Payer: Self-pay

## 2023-10-22 DIAGNOSIS — H353131 Nonexudative age-related macular degeneration, bilateral, early dry stage: Secondary | ICD-10-CM | POA: Diagnosis not present

## 2023-10-23 DIAGNOSIS — R269 Unspecified abnormalities of gait and mobility: Secondary | ICD-10-CM | POA: Diagnosis not present

## 2023-10-25 DIAGNOSIS — R269 Unspecified abnormalities of gait and mobility: Secondary | ICD-10-CM | POA: Diagnosis not present

## 2023-11-07 ENCOUNTER — Other Ambulatory Visit: Payer: Self-pay | Admitting: Cardiology

## 2023-12-02 ENCOUNTER — Other Ambulatory Visit: Payer: Self-pay | Admitting: Cardiology

## 2023-12-03 DIAGNOSIS — R634 Abnormal weight loss: Secondary | ICD-10-CM | POA: Diagnosis not present

## 2023-12-03 DIAGNOSIS — W19XXXD Unspecified fall, subsequent encounter: Secondary | ICD-10-CM | POA: Diagnosis not present

## 2023-12-03 DIAGNOSIS — G4489 Other headache syndrome: Secondary | ICD-10-CM | POA: Diagnosis not present

## 2023-12-03 DIAGNOSIS — G5603 Carpal tunnel syndrome, bilateral upper limbs: Secondary | ICD-10-CM | POA: Diagnosis not present

## 2023-12-03 DIAGNOSIS — R2689 Other abnormalities of gait and mobility: Secondary | ICD-10-CM | POA: Diagnosis not present

## 2023-12-03 DIAGNOSIS — R030 Elevated blood-pressure reading, without diagnosis of hypertension: Secondary | ICD-10-CM | POA: Diagnosis not present

## 2023-12-03 DIAGNOSIS — Z8669 Personal history of other diseases of the nervous system and sense organs: Secondary | ICD-10-CM | POA: Diagnosis not present

## 2023-12-03 DIAGNOSIS — R4189 Other symptoms and signs involving cognitive functions and awareness: Secondary | ICD-10-CM | POA: Diagnosis not present

## 2023-12-04 DIAGNOSIS — R4182 Altered mental status, unspecified: Secondary | ICD-10-CM | POA: Diagnosis not present

## 2023-12-04 DIAGNOSIS — Z8669 Personal history of other diseases of the nervous system and sense organs: Secondary | ICD-10-CM | POA: Diagnosis not present

## 2023-12-04 DIAGNOSIS — R634 Abnormal weight loss: Secondary | ICD-10-CM | POA: Diagnosis not present

## 2023-12-05 ENCOUNTER — Other Ambulatory Visit: Payer: Self-pay | Admitting: Student

## 2023-12-05 DIAGNOSIS — G4489 Other headache syndrome: Secondary | ICD-10-CM

## 2023-12-11 DIAGNOSIS — W19XXXD Unspecified fall, subsequent encounter: Secondary | ICD-10-CM | POA: Diagnosis not present

## 2023-12-11 DIAGNOSIS — M6289 Other specified disorders of muscle: Secondary | ICD-10-CM | POA: Diagnosis not present

## 2023-12-11 DIAGNOSIS — Z8669 Personal history of other diseases of the nervous system and sense organs: Secondary | ICD-10-CM | POA: Diagnosis not present

## 2023-12-14 ENCOUNTER — Ambulatory Visit
Admission: RE | Admit: 2023-12-14 | Discharge: 2023-12-14 | Disposition: A | Discharge status: Home / Self Care | Payer: Medicare PPO | Source: Ambulatory Visit | Attending: Student | Admitting: Student

## 2023-12-14 ENCOUNTER — Ambulatory Visit: Payer: Medicare PPO | Admitting: Family Medicine

## 2023-12-14 ENCOUNTER — Other Ambulatory Visit: Payer: Self-pay | Admitting: Family Medicine

## 2023-12-14 ENCOUNTER — Encounter: Payer: Self-pay | Admitting: Family Medicine

## 2023-12-14 VITALS — BP 150/70 | HR 72 | Temp 98.3°F | Ht 61.5 in | Wt 129.2 lb

## 2023-12-14 DIAGNOSIS — H6121 Impacted cerumen, right ear: Secondary | ICD-10-CM | POA: Insufficient documentation

## 2023-12-14 DIAGNOSIS — G319 Degenerative disease of nervous system, unspecified: Secondary | ICD-10-CM | POA: Diagnosis not present

## 2023-12-14 DIAGNOSIS — G4489 Other headache syndrome: Secondary | ICD-10-CM | POA: Insufficient documentation

## 2023-12-14 DIAGNOSIS — I6782 Cerebral ischemia: Secondary | ICD-10-CM | POA: Diagnosis not present

## 2023-12-14 DIAGNOSIS — I639 Cerebral infarction, unspecified: Secondary | ICD-10-CM | POA: Diagnosis not present

## 2023-12-14 NOTE — Progress Notes (Signed)
Patient ID: Morgan [name], female    DOB: November 27, 1940, 83 y.o.   MRN: [id number]  This visit was conducted in person.  BP (!) 150/70 (BP Location: Right Arm, Patient Position: Sitting, Cuff Size: Normal)   Pulse 72   Temp 98.3 F (36.8 C) (Temporal)   Ht 5' 1.5" (1.562 m)   Wt 129 lb 4 oz (58.6 kg)   SpO2 99%   BMI 24.03 kg/m    CC:  Chief Complaint  Patient presents with   Cerumen Impaction    Subjective:   HPI: Morgan [name] is a 83 y.o. female presenting on 12/14/2023 for Cerumen Impaction   Earwax impaction noted at specialist office visit in both ears.  She has been treating with earwax drops at home. No pain in the ear, slight decreased in hearing, no fever, no sinus pain  She has upcoming MRI planned at [facility] for unexpected weight loss.      Relevant past medical, surgical, family and social history reviewed and updated as indicated. Interim medical history since our last visit reviewed. Allergies and medications reviewed and updated. Outpatient Medications Prior to Visit  Medication Sig Dispense Refill   Alum & Mag Hydroxide-Simeth (MAALOX REGULAR STRENGTH PO) Take by mouth daily as needed. Patient takes 1 capful daily of the powder formulation.     aspirin EC 81 MG tablet Take 1 tablet (81 mg total) by mouth daily.     atorvastatin (LIPITOR) 80 MG tablet TAKE 1 TABLET BY MOUTH EVERY DAY 90 tablet 0   BLINK TEARS 0.25 % SOLN Place 1 drop into both eyes daily as needed (dry/irritated eyes.).     calcium-vitamin D (OSCAL WITH D) 500-200 MG-UNIT tablet Take 1 tablet by mouth 2 (two) times daily.     chlorthalidone (HYGROTON) 25 MG tablet TAKE 1 TABLET BY MOUTH EVERY DAY 90 tablet 3   CINNAMON PO Take 1,000 mg by mouth in the morning and at bedtime.     ferrous sulfate 325 (65 FE) MG tablet Take 325 mg by mouth daily.     gabapentin (NEURONTIN) 300 MG capsule TAKE ONE CAPSULE IN THE MORNING, ONE CAPSULE IN THE EVENING AND 3 CAPSULES AT BEDTIME. 150 capsule 0    Lancet Device MISC Use to check blood sugar twice daily. 1 each 0   Lancets (FREESTYLE) lancets Use to check blood sugar twice daily 100 each 11   metFORMIN (GLUCOPHAGE-XR) 500 MG 24 hr tablet TAKE 2 TABLETS BY MOUTH TWICE A DAY 360 tablet 3   methylcellulose (CITRUCEL) oral powder Use as directed daily     metoprolol succinate (TOPROL-XL) 100 MG 24 hr tablet TAKE 1 TABLET BY MOUTH EVERY DAY WITH OR IMMEDIATELY FOLLOWING A MEAL 90 tablet 3   Multiple Vitamins-Minerals (PRESERVISION AREDS PO) Take 1 tablet by mouth 2 (two) times daily. Reported on 12/23/2015     nitroGLYCERIN (NITROSTAT) 0.4 MG SL tablet PLACE 1 TABLET (0.4 MG TOTAL) UNDER THE TONGUE EVERY 5 (FIVE) MINUTES AS NEEDED FOR CHEST PAIN 25 tablet 3   ONETOUCH ULTRA test strip USE TO CHECK BLOOD SUGAR TWICE DAILY 150 strip 3   Probiotic Product (ALIGN PO) Take by mouth at bedtime.     QUEtiapine (SEROQUEL) 25 MG tablet TAKE 1 TABLET BY MOUTH EVERYDAY AT BEDTIME 90 tablet 1   rOPINIRole (REQUIP) 1 MG tablet 1/2 TABLET DURING THE DAY, 2 TABLETS AT BEDTIME 270 tablet 3   venlafaxine XR (EFFEXOR-XR) 75 MG 24 hr capsule TAKE 1 CAPSULE BY  MOUTH DAILY WITH BREAKFAST. 90 capsule 1   Vibegron (GEMTESA) 75 MG TABS Take 1 tablet (75 mg total) by mouth daily. 30 tablet 11   vitamin B-12 (CYANOCOBALAMIN) 1000 MCG tablet Take 1,000 mcg by mouth daily.     No facility-administered medications prior to visit.     Per HPI unless specifically indicated in ROS section below Review of Systems  Constitutional:  Negative for fatigue and fever.  HENT:  Negative for ear pain.   Eyes:  Negative for pain.  Respiratory:  Negative for chest tightness and shortness of breath.   Cardiovascular:  Negative for chest pain, palpitations and leg swelling.  Gastrointestinal:  Negative for abdominal pain.  Genitourinary:  Negative for dysuria.   Objective:  BP (!) 150/70 (BP Location: Right Arm, Patient Position: Sitting, Cuff Size: Normal)   Pulse 72   Temp 98.3  F (36.8 C) (Temporal)   Ht 5' 1.5" (1.562 m)   Wt 129 lb 4 oz (58.6 kg)   SpO2 99%   BMI 24.03 kg/m   Wt Readings from Last 3 Encounters:  12/14/23 129 lb 4 oz (58.6 kg)  08/14/23 132 lb (59.9 kg)  07/12/23 132 lb 6 oz (60 kg)      Physical Exam Constitutional:      General: She is not in acute distress.    Appearance: She is well-developed. She is not ill-appearing or toxic-appearing.  HENT:     Head: Normocephalic.     Right Ear: Hearing, tympanic membrane, ear canal and external ear normal. There is impacted cerumen. Tympanic membrane is not erythematous, retracted or bulging.     Left Ear: Hearing, tympanic membrane, ear canal and external ear normal. Tympanic membrane is not erythematous, retracted or bulging.     Ears:     Comments: Normal bialteral ear exam following irrigation    Nose: Mucosal edema present. No rhinorrhea.     Right Sinus: No maxillary sinus tenderness or frontal sinus tenderness.     Left Sinus: No maxillary sinus tenderness or frontal sinus tenderness.     Mouth/Throat:     Pharynx: Uvula midline.  Eyes:     General: Lids are normal. Lids are everted, no foreign bodies appreciated.     Conjunctiva/sclera: Conjunctivae normal.     Pupils: Pupils are equal, round, and reactive to light.  Neck:     Thyroid: No thyroid mass or thyromegaly.     Vascular: No carotid bruit.     Trachea: Trachea normal.  Cardiovascular:     Rate and Rhythm: Normal rate and regular rhythm.     Pulses: Normal pulses.     Heart sounds: Normal heart sounds, S1 normal and S2 normal. No murmur heard.    No friction rub. No gallop.  Pulmonary:     Effort: Pulmonary effort is normal. No tachypnea or respiratory distress.     Breath sounds: Normal breath sounds. No decreased breath sounds, wheezing, rhonchi or rales.  Musculoskeletal:     Cervical back: Normal range of motion and neck supple.  Skin:    General: Skin is warm and dry.     Findings: No rash.  Neurological:      Mental Status: She is alert.  Psychiatric:        Mood and Affect: Mood is not anxious or depressed.        Speech: Speech normal.        Behavior: Behavior normal. Behavior is cooperative.  Judgment: Judgment normal.       Results for orders placed or performed in visit on 07/02/23  Microscopic Examination   Collection Time: 07/02/23 10:58 AM   Urine  Result Value Ref Range   WBC, UA 11-30 (A) 0 - 5 /hpf   RBC, Urine 0-2 0 - 2 /hpf   Epithelial Cells (non renal) 0-10 0 - 10 /hpf   Bacteria, UA Moderate (A) None seen/Few  Urinalysis, Complete   Collection Time: 07/02/23 10:58 AM  Result Value Ref Range   Specific Gravity, UA 1.020 1.005 - 1.030   pH, UA 5.5 5.0 - 7.5   Color, UA Yellow Yellow   Appearance Ur Clear Clear   Leukocytes,UA 2+ (A) Negative   Protein,UA Negative Negative/Trace   Glucose, UA Negative Negative   Ketones, UA Negative Negative   RBC, UA Negative Negative   Bilirubin, UA Negative Negative   Urobilinogen, Ur 0.2 0.2 - 1.0 mg/dL   Nitrite, UA Negative Negative   Microscopic Examination See below:    *Note: Due to a large number of results and/or encounters for the requested time period, some results have not been displayed. A complete set of results can be found in Results Review.    Assessment and Plan  Impacted cerumen of right ear Assessment & Plan: Cerumen impaction removal via irrigation Performed by :  Terese Door, CMA Consent for procedure obtained verbally. Right ear lavaged/ irrigated with warm water gently. Left ear already clear. Pt tolerated procedure well, with no complications. After procedure ears clear of cerumen impaction, with minimal ear canal irritation and redness, no bleeding. TM intact and symptoms improved.       No follow-ups on file.   Kerby Nora, MD

## 2023-12-14 NOTE — Assessment & Plan Note (Signed)
Cerumen impaction removal via irrigation Performed by :  Terese Door, CMA Consent for procedure obtained verbally. Right ear lavaged/ irrigated with warm water gently. Left ear already clear. Pt tolerated procedure well, with no complications. After procedure ears clear of cerumen impaction, with minimal ear canal irritation and redness, no bleeding. TM intact and symptoms improved.

## 2024-01-01 DIAGNOSIS — S22080A Wedge compression fracture of T11-T12 vertebra, initial encounter for closed fracture: Secondary | ICD-10-CM | POA: Diagnosis not present

## 2024-01-01 DIAGNOSIS — M5136 Other intervertebral disc degeneration, lumbar region with discogenic back pain only: Secondary | ICD-10-CM | POA: Diagnosis not present

## 2024-01-01 DIAGNOSIS — M47816 Spondylosis without myelopathy or radiculopathy, lumbar region: Secondary | ICD-10-CM | POA: Diagnosis not present

## 2024-01-01 DIAGNOSIS — S32030A Wedge compression fracture of third lumbar vertebra, initial encounter for closed fracture: Secondary | ICD-10-CM | POA: Diagnosis not present

## 2024-01-02 ENCOUNTER — Other Ambulatory Visit: Payer: Self-pay | Admitting: Orthopedic Surgery

## 2024-01-02 ENCOUNTER — Other Ambulatory Visit: Payer: Self-pay

## 2024-01-02 DIAGNOSIS — S32030A Wedge compression fracture of third lumbar vertebra, initial encounter for closed fracture: Secondary | ICD-10-CM

## 2024-01-02 DIAGNOSIS — S22080A Wedge compression fracture of T11-T12 vertebra, initial encounter for closed fracture: Secondary | ICD-10-CM

## 2024-01-08 ENCOUNTER — Ambulatory Visit: Payer: Medicare PPO | Admitting: Internal Medicine

## 2024-01-08 ENCOUNTER — Encounter: Payer: Self-pay | Admitting: Internal Medicine

## 2024-01-08 ENCOUNTER — Ambulatory Visit: Payer: Self-pay | Admitting: Family Medicine

## 2024-01-08 ENCOUNTER — Other Ambulatory Visit: Payer: Self-pay | Admitting: Orthopedic Surgery

## 2024-01-08 VITALS — BP 136/78 | HR 68 | Temp 98.2°F | Ht 61.5 in | Wt 131.0 lb

## 2024-01-08 DIAGNOSIS — J0140 Acute pansinusitis, unspecified: Secondary | ICD-10-CM | POA: Insufficient documentation

## 2024-01-08 DIAGNOSIS — S32030A Wedge compression fracture of third lumbar vertebra, initial encounter for closed fracture: Secondary | ICD-10-CM

## 2024-01-08 DIAGNOSIS — S22080A Wedge compression fracture of T11-T12 vertebra, initial encounter for closed fracture: Secondary | ICD-10-CM

## 2024-01-08 MED ORDER — AMOXICILLIN-POT CLAVULANATE 875-125 MG PO TABS: 1.0000 | ORAL_TABLET | Freq: Two times a day (BID) | ORAL | 0 refills | Status: DC

## 2024-01-08 MED ORDER — BENZONATATE 200 MG PO CAPS: 200.0000 mg | ORAL_CAPSULE | Freq: Three times a day (TID) | ORAL | 0 refills | Status: DC | PRN

## 2024-01-08 NOTE — Telephone Encounter (Signed)
 Severe sore throat    Cough, productive    Severe headache    Fever    Symptoms started a day ago   Chief Complaint: Productive cough, sore throat, fever Symptoms: Above Frequency: 2 days ago Pertinent Negatives: Patient denies  Disposition: [] ED /[] Urgent Care (no appt availability in office) / [x] Appointment(In office/virtual)/ []  [facility]/ [] Home Care/ [] Refused Recommended Disposition /[] [facility]/ []  Follow-up with PCP Additional Notes: Pt. Agrees with appointment.  Reason for Disposition  SEVERE coughing spells (e.g., whooping sound after coughing, vomiting after coughing)  Answer Assessment - Initial Assessment Questions 1. ONSET: "When did the cough begin?"      2 days ago 2. SEVERITY: "How bad is the cough today?"      Moderate 3. SPUTUM: "Describe the color of your sputum" (none, dry cough; clear, white, yellow, green)     Yellow 4. HEMOPTYSIS: "Are you coughing up any blood?" If so ask: "How much?" (flecks, streaks, tablespoons, etc.)     No 5. DIFFICULTY BREATHING: "Are you having difficulty breathing?" If Yes, ask: "How bad is it?" (e.g., mild, moderate, severe)    - MILD: No SOB at rest, mild SOB with walking, speaks normally in sentences, can lie down, no retractions, pulse < 100.    - MODERATE: SOB at rest, SOB with minimal exertion and prefers to sit, cannot lie down flat, speaks in phrases, mild retractions, audible wheezing, pulse 100-120.    - SEVERE: Very SOB at rest, speaks in single words, struggling to breathe, sitting hunched forward, retractions, pulse > 120      Mild 6. FEVER: "Do you have a fever?" If Yes, ask: "What is your temperature, how was it measured, and when did it start?"     No 7. CARDIAC HISTORY: "Do you have any history of heart disease?" (e.g., heart attack, congestive heart failure)      A fib 8. LUNG HISTORY: "Do you have any history of lung disease?"  (e.g., pulmonary embolus, asthma, emphysema)     No 9.  PE RISK FACTORS: "Do you have a history of blood clots?" (or: recent major surgery, recent prolonged travel, bedridden)     No 10. OTHER SYMPTOMS: "Do you have any other symptoms?" (e.g., runny nose, wheezing, chest pain)       Sore throat, runny nose 11. PREGNANCY: "Is there any chance you are pregnant?" "When was your last menstrual period?"       N/a 12. TRAVEL: "Have you traveled out of the country in the last month?" (e.g., travel history, exposures)       No  Protocols used: Cough - Acute Productive-A-AH

## 2024-01-08 NOTE — Progress Notes (Signed)
 Subjective:    Patient ID: Morgan [name], female    DOB: Oct 24, 1941, 83 y.o.   MRN: [id number]  HPI Here due to respiratory infection  About a week ago--started with laryngitis Not unusual for her until it just didn't go away Now with cough, fever at night and feeling bad Bad rhinorrhea and loose phlegm in upper chest Cough brings up some phlemg Has post nasal drip--and sore throat  Frontal headache Some ear fullness Some SOB  Hasn't taken anything other than aleve yesterday--didn't help  Current Outpatient Medications on File Prior to Visit  Medication Sig Dispense Refill   Alum & Mag Hydroxide-Simeth (MAALOX REGULAR STRENGTH PO) Take by mouth daily as needed. Patient takes 1 capful daily of the powder formulation.     aspirin EC 81 MG tablet Take 1 tablet (81 mg total) by mouth daily.     atorvastatin (LIPITOR) 80 MG tablet TAKE 1 TABLET BY MOUTH EVERY DAY 90 tablet 0   BLINK TEARS 0.25 % SOLN Place 1 drop into both eyes daily as needed (dry/irritated eyes.).     calcium-vitamin D (OSCAL WITH D) 500-200 MG-UNIT tablet Take 1 tablet by mouth 2 (two) times daily.     chlorthalidone (HYGROTON) 25 MG tablet TAKE 1 TABLET BY MOUTH EVERY DAY 90 tablet 3   CINNAMON PO Take 1,000 mg by mouth in the morning and at bedtime.     ferrous sulfate 325 (65 FE) MG tablet Take 325 mg by mouth daily.     gabapentin (NEURONTIN) 300 MG capsule TAKE ONE CAPSULE IN THE MORNING, ONE CAPSULE IN THE EVENING AND 3 CAPSULES AT BEDTIME. 150 capsule 0   Lancet Device MISC Use to check blood sugar twice daily. 1 each 0   Lancets (FREESTYLE) lancets Use to check blood sugar twice daily 100 each 11   metFORMIN (GLUCOPHAGE-XR) 500 MG 24 hr tablet TAKE 2 TABLETS BY MOUTH TWICE A DAY 360 tablet 3   methylcellulose (CITRUCEL) oral powder Use as directed daily     metoprolol succinate (TOPROL-XL) 100 MG 24 hr tablet TAKE 1 TABLET BY MOUTH EVERY DAY WITH OR IMMEDIATELY FOLLOWING A MEAL 90 tablet 3   Multiple  Vitamins-Minerals (PRESERVISION AREDS PO) Take 1 tablet by mouth 2 (two) times daily. Reported on 12/23/2015     nitroGLYCERIN (NITROSTAT) 0.4 MG SL tablet PLACE 1 TABLET (0.4 MG TOTAL) UNDER THE TONGUE EVERY 5 (FIVE) MINUTES AS NEEDED FOR CHEST PAIN 25 tablet 3   ONETOUCH ULTRA test strip USE TO CHECK BLOOD SUGAR TWICE DAILY 150 strip 3   Probiotic Product (ALIGN PO) Take by mouth at bedtime.     QUEtiapine (SEROQUEL) 25 MG tablet TAKE 1 TABLET BY MOUTH EVERYDAY AT BEDTIME 90 tablet 1   rOPINIRole (REQUIP) 1 MG tablet 1/2 TABLET DURING THE DAY, 2 TABLETS AT BEDTIME 270 tablet 3   venlafaxine XR (EFFEXOR-XR) 75 MG 24 hr capsule TAKE 1 CAPSULE BY MOUTH DAILY WITH BREAKFAST. 90 capsule 1   Vibegron (GEMTESA) 75 MG TABS Take 1 tablet (75 mg total) by mouth daily. 30 tablet 11   vitamin B-12 (CYANOCOBALAMIN) 1000 MCG tablet Take 1,000 mcg by mouth daily.     No current facility-administered medications on file prior to visit.    Allergies  Allergen Reactions   Hydrocodone-Acetaminophen Other (See Comments)    "Changed personality" "made me very mean"   Percocet [Oxycodone-Acetaminophen] Other (See Comments)    hallucination   Sulfa Antibiotics Other (See Comments)    hallucinations  Past Medical History:  Diagnosis Date   Angina    Arthritis    Atrial fibrillation (HCC)    ASPIRIN FOR BLOOD THINNER   Atypical mole 09/22/2016   Bulging discs    lumbar    Complication of anesthesia    pt states has choking sensation with ET tube    Coronary artery disease    Depression    Diabetes mellitus    diet controlled/on meds   Family history of breast cancer    Family history of colon cancer    Fibromyalgia    GERD (gastroesophageal reflux disease)    H/O hiatal hernia    Headache(784.0)    "recurring"   High cholesterol    History of colon polyps    Hyperlipidemia    Hypertension    [place]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  CORTISONE INJECTION INTO BACK 08/30/12    Past Surgical History:  Procedure Laterality Date   81 HOUR PH STUDY N/A 12/29/2015   Procedure: 24 HOUR PH STUDY;  Surgeon: Ruffin Frederick, MD;  Location: [facility];  Service: Gastroenterology;  Laterality: N/A;   CARPAL TUNNEL RELEASE Left 07/02/2015   Procedure: CARPAL TUNNEL RELEASE;  Surgeon: Gean Birchwood, MD;  Location: [facility];  Service: Orthopedics;  Laterality: Left;   CATARACT EXTRACTION, BILATERAL Bilateral    Oct and Nov 2017   COLONOSCOPY  06/30/2019   CORONARY ANGIOPLASTY WITH STENT PLACEMENT  06/2011   "1"   CORONARY ARTERY BYPASS GRAFT  2005   CABG X 2   DILATION AND CURETTAGE OF UTERUS     "more than once"   ELBOW ARTHROSCOPY Left 07/02/2015   Procedure: ARTHROSCOPY LEFT ELBOW WITH DEBRIDEMENT AND REMOVAL LOOSE BODY;  Surgeon: Gean Birchwood, MD;  Location: [facility];  Service: Orthopedics;  Laterality: Left;   ESOPHAGEAL DILATION  01/21/2020   Procedure: ESOPHAGEAL DILATION;  Surgeon: Shellia Cleverly, DO;  Location: [facility];  Service: Gastroenterology;;   ESOPHAGEAL MANOMETRY N/A 12/29/2015   Procedure: ESOPHAGEAL MANOMETRY (EM);  Surgeon: Ruffin Frederick, MD;  Location: [facility];  Service: Gastroenterology;  Laterality: N/A;   ESOPHAGEAL MANOMETRY N/A 07/30/2019   Procedure: ESOPHAGEAL MANOMETRY (EM);  Surgeon: Benancio Deeds, MD;  Location: [facility];  Service: Gastroenterology;  Laterality: N/A;   ESOPHAGOGASTRODUODENOSCOPY (EGD) WITH PROPOFOL N/A 01/21/2020   Procedure: ESOPHAGOGASTRODUODENOSCOPY (EGD) WITH PROPOFOL;  Surgeon: Shellia Cleverly, DO;  Location: [facility];  Service: Gastroenterology;   Laterality: N/A;   FRACTURE SURGERY  ~ 2005   nose   KNEE ARTHROSCOPY  09/04/2012   Procedure: ARTHROSCOPY KNEE;  Surgeon: Loanne Drilling, MD;  Location: [facility];  Service: Orthopedics;  Laterality: Right;  right knee arthroscopy with medial and lateral meniscus debridement   MELANOMA EXCISION Right 10/13/2016   right side of neck   MOUTH SURGERY  2004   "bone replacement; had cadavear bones put in; face was collapsing"   NASAL SEPTUM SURGERY  ~ 1986   TOTAL KNEE ARTHROPLASTY Right 07/07/2013  Procedure: RIGHT TOTAL KNEE ARTHROPLASTY;  Surgeon: Loanne Drilling, MD;  Location: [facility];  Service: Orthopedics;  Laterality: Right;   TRANSORAL INCISIONLESS FUNDOPLICATION N/A 01/21/2020   Procedure: TRANSORAL INCISIONLESS FUNDOPLICATION;  Surgeon: Shellia Cleverly, DO;  Location: [facility];  Service: Gastroenterology;  Laterality: N/A;   UPPER GASTROINTESTINAL ENDOSCOPY      Family History  Problem Relation Age of Onset   Breast cancer Mother 20   Heart disease Father 20       sudden onset due to CAD   Hypertension Sister    Dementia Sister    Diabetes Sister    Cancer - Colon Sister 47   Colon cancer Sister    GER disease Daughter    Hypertension Daughter    Heart disease Brother    Hypertension Brother    Heart attack Neg Hx    Stroke Neg Hx    Esophageal cancer Neg Hx    Rectal cancer Neg Hx    Stomach cancer Neg Hx     Social History   Socioeconomic History   Marital status: Married    Spouse name: Not on file   Number of children: 2   Years of education: college   Highest education level: Not on file  Occupational History   Occupation: Retired   Tobacco Use   Smoking status: Never   Smokeless tobacco: Never  Vaping Use   Vaping status: Never Used  Substance and Sexual Activity   Alcohol use: Yes    Alcohol/week: 1.0 standard drink of alcohol    Types: 1 Glasses of wine per week    Comment: "occasionally drink wine"   Drug use: No   Sexual activity: Yes   Other Topics Concern   Not on file  Social History Narrative   Drinks 1 cup of coffee a day    Social Drivers of Corporate investment banker Strain: Low Risk  (02/28/2023)   Overall Financial Resource Strain (CARDIA)    Difficulty of Paying Living Expenses: Not hard at all  Food Insecurity: No Food Insecurity (02/28/2023)   Hunger Vital Sign    Worried About Running Out of Food in the Last Year: Never true    Ran Out of Food in the Last Year: Never true  Transportation Needs: No Transportation Needs (02/28/2023)   PRAPARE - Administrator, Civil Service (Medical): No    Lack of Transportation (Non-Medical): No  Physical Activity: Inactive (02/28/2023)   Exercise Vital Sign    Days of Exercise per Week: 0 days    Minutes of Exercise per Session: 0 min  Stress: No Stress Concern Present (02/28/2023)   [employer] of Occupational Health - Occupational Stress Questionnaire    Feeling of Stress : Not at all  Social Connections: Socially Integrated (02/28/2023)   Social Connection and Isolation Panel [NHANES]    Frequency of Communication with Friends and Family: More than three times a week    Frequency of Social Gatherings with Friends and Family: More than three times a week    Attends Religious Services: More than 4 times per year    Active Member of [employer] or Organizations: Yes    Attends Banker Meetings: More than 4 times per year    Marital Status: Married  Catering manager Violence: Not At Risk (02/28/2023)   Humiliation, Afraid, Rape, and Kick questionnaire    Fear of Current or Ex-Partner: No    Emotionally Abused: No  Physically Abused: No    Sexually Abused: No   Review of Systems Lost sense of smell and taste No N/V Eating fair Didn't do COVID test     Objective:   Physical Exam Constitutional:      General: She is not in acute distress. HENT:     Head:     Comments: Mild maxillary tenderness    Right Ear: Tympanic membrane and  ear canal normal.     Left Ear: Tympanic membrane and ear canal normal.     Mouth/Throat:     Pharynx: No oropharyngeal exudate or posterior oropharyngeal erythema.  Pulmonary:     Effort: Pulmonary effort is normal.     Breath sounds: Normal breath sounds. No wheezing or rales.  Musculoskeletal:     Cervical back: Neck supple.  Lymphadenopathy:     Cervical: No cervical adenopathy.  Neurological:     Mental Status: She is alert.            Assessment & Plan:

## 2024-01-08 NOTE — Telephone Encounter (Signed)
 Noted.

## 2024-01-08 NOTE — Assessment & Plan Note (Signed)
 1 week of illness that might have been COVID Now with drainage and cough Doesn't seem to have infection in chest --despite mild SOB Will treat with augmentin 875 mg tid x 7 days Benzonatate for cough Recheck if SOB worsens

## 2024-01-09 ENCOUNTER — Encounter: Payer: Self-pay | Admitting: Orthopedic Surgery

## 2024-01-11 ENCOUNTER — Telehealth: Payer: Self-pay

## 2024-01-11 DIAGNOSIS — R051 Acute cough: Secondary | ICD-10-CM | POA: Diagnosis not present

## 2024-01-11 DIAGNOSIS — J019 Acute sinusitis, unspecified: Secondary | ICD-10-CM | POA: Diagnosis not present

## 2024-01-11 DIAGNOSIS — J9801 Acute bronchospasm: Secondary | ICD-10-CM | POA: Diagnosis not present

## 2024-01-11 DIAGNOSIS — R0602 Shortness of breath: Secondary | ICD-10-CM | POA: Diagnosis not present

## 2024-01-11 DIAGNOSIS — J189 Pneumonia, unspecified organism: Secondary | ICD-10-CM | POA: Diagnosis not present

## 2024-01-11 DIAGNOSIS — B9689 Other specified bacterial agents as the cause of diseases classified elsewhere: Secondary | ICD-10-CM | POA: Diagnosis not present

## 2024-01-11 NOTE — Telephone Encounter (Signed)
 Call patient. Looks like she saw Dr. Alphonsus Sias on February 25 for pansinusitis 3 days ago and was started on Augmentin, sounds like the Augmentin has caused diarrhea as a side effect.  Is this correct? If so we can change to plain amoxicillin 500 mg 2 capsules twice daily for 10 days.  This should not cause as much diarrhea.

## 2024-01-11 NOTE — Telephone Encounter (Signed)
 Mrs. Daily notified as instructed by telephone.  She states Dr. Alphonsus Sias also gave her Jerilynn Som for cough and they are not helping.  She states she is still very congested  and she can hear rattling in her chest.  She states she also has pain above and through her right eye when she coughs.  She says she just feels terrible.  Please advise.

## 2024-01-11 NOTE — Telephone Encounter (Signed)
 Copied from CRM [id number]. Topic: Clinical - Red Word Triage >> Jan 08, 2024  8:10 AM Adele Barthel wrote: Red Word that prompted transfer to Nurse Triage:   Severe sore throat Cough, productive  Severe headache Fever Symptoms started a day ago >> Jan 11, 2024  8:10 AM Irine Seal wrote: The patient  is currently on amoxicillin-clavulanate (AUGMENTIN) 875-125 MG and benzonatate (TESSALON) 200 MG, which was prescribed by another doctor. The patient states her condition has worsened, and she has developed diarrhea since last Tuesday. She had laryngitis prior to this. The patient is requesting to speak with Lupita Leash and would like to know if something else can be prescribed, she denied triage and only wants to speak with Lupita Leash CB# [phone]

## 2024-01-11 NOTE — Telephone Encounter (Signed)
 In light of the fact that she is not just calling because of Augmentin side effect of diarrhea and her symptoms are significantly worse and she now has rattling in her chest... Please let her know I do think she needs reevaluation in the office ASAP.  I believe we are probably full today, if so I would recommend a visit at an another [place] office or urgent care for consideration of chest x-ray

## 2024-01-11 NOTE — Telephone Encounter (Signed)
 Morgan [name] notified as instructed by telephone.  Patient states understanding.  Patient agreeable to going the Urgent Care.

## 2024-01-12 ENCOUNTER — Other Ambulatory Visit: Payer: Medicare PPO

## 2024-01-15 ENCOUNTER — Ambulatory Visit: Payer: Self-pay | Admitting: Family Medicine

## 2024-01-15 ENCOUNTER — Other Ambulatory Visit: Payer: Self-pay

## 2024-01-15 ENCOUNTER — Emergency Department

## 2024-01-15 ENCOUNTER — Emergency Department
Admission: EM | Admit: 2024-01-15 | Discharge: 2024-01-15 | Disposition: A | Discharge status: Home / Self Care | Attending: Emergency Medicine | Admitting: Emergency Medicine

## 2024-01-15 DIAGNOSIS — R197 Diarrhea, unspecified: Secondary | ICD-10-CM | POA: Insufficient documentation

## 2024-01-15 DIAGNOSIS — R7989 Other specified abnormal findings of blood chemistry: Secondary | ICD-10-CM | POA: Diagnosis not present

## 2024-01-15 DIAGNOSIS — I700 Atherosclerosis of aorta: Secondary | ICD-10-CM | POA: Insufficient documentation

## 2024-01-15 DIAGNOSIS — J189 Pneumonia, unspecified organism: Secondary | ICD-10-CM | POA: Diagnosis not present

## 2024-01-15 LAB — C DIFFICILE QUICK SCREEN W PCR REFLEX
C Diff antigen: NEGATIVE
C Diff interpretation: NOT DETECTED
C Diff toxin: NEGATIVE

## 2024-01-15 LAB — GASTROINTESTINAL PANEL BY PCR, STOOL (REPLACES STOOL CULTURE)

## 2024-01-15 LAB — CBC
HCT: 39.9 % (ref 36.0–46.0)
Hemoglobin: 12.9 g/dL (ref 12.0–15.0)
MCH: 30.4 pg (ref 26.0–34.0)
MCHC: 32.3 g/dL (ref 30.0–36.0)
MCV: 94.1 fL (ref 80.0–100.0)
Platelets: 375 10*3/uL (ref 150–400)
RBC: 4.24 MIL/uL (ref 3.87–5.11)
RDW: 12 % (ref 11.5–15.5)
WBC: 8.4 10*3/uL (ref 4.0–10.5)
nRBC: 0 % (ref 0.0–0.2)

## 2024-01-15 LAB — COMPREHENSIVE METABOLIC PANEL WITH GFR
ALT: 36 U/L (ref 0–44)
AST: 26 U/L (ref 15–41)
Albumin: 3.6 g/dL (ref 3.5–5.0)
Alkaline Phosphatase: 74 U/L (ref 38–126)
Anion gap: 11 (ref 5–15)
BUN: 43 mg/dL — ABNORMAL HIGH (ref 8–23)
CO2: 23 mmol/L (ref 22–32)
Calcium: 8.6 mg/dL — ABNORMAL LOW (ref 8.9–10.3)
Chloride: 100 mmol/L (ref 98–111)
Creatinine, Ser: 0.92 mg/dL (ref 0.44–1.00)
GFR, Estimated: >60 mL/min
Glucose, Bld: 298 mg/dL — ABNORMAL HIGH (ref 70–99)
Potassium: 4.6 mmol/L (ref 3.5–5.1)
Sodium: 134 mmol/L — ABNORMAL LOW (ref 135–145)
Total Bilirubin: 0.8 mg/dL (ref 0.0–1.2)
Total Protein: 6.3 g/dL — ABNORMAL LOW (ref 6.5–8.1)

## 2024-01-15 LAB — LIPASE, BLOOD: Lipase: 392 U/L — ABNORMAL HIGH (ref 11–51)

## 2024-01-15 MED ORDER — SODIUM CHLORIDE 0.9 % IV BOLUS
1000.0000 mL | Freq: Once | INTRAVENOUS | Status: AC
  Administered 2024-01-15: 1000 mL via INTRAVENOUS

## 2024-01-15 NOTE — ED Notes (Signed)
Lactic sent with labs. ?

## 2024-01-15 NOTE — Telephone Encounter (Signed)
 Called [facility] to advise them unsure if patient will follow disposition at this time and to possibly reach out to the patient.

## 2024-01-15 NOTE — Telephone Encounter (Signed)
 Message from Madison V sent at 01/15/2024  9:43 AM EST  Copied From CRM [id number]. Reason for Triage: Patient is having diarrhea. She  spoke to doctor at the clinic that had treated her and was told to call PCP. She is taking antibiotics that are causing severe diarrhea.    Called patient--No answer--Unable to leave voicemail--1st attempt

## 2024-01-15 NOTE — ED Notes (Signed)
 Pt denies any pain at this time, states breathing is better today.

## 2024-01-15 NOTE — Telephone Encounter (Addendum)
 Copied From CRM [id number]. Reason for Triage: Patient is having diarrhea. She  spoke to doctor at the clinic that had treated her and was told to call PCP. She is taking antibiotics that are causing severe diarrhea.     Chief Complaint: Cough, Shortness of breath, Diarrhea Symptoms: diarrhea, productive cough with yellow phlegm,  Frequency: at least a week Pertinent Negatives: Patient denies nausea, vomiting, recent travel to another country Disposition: [] ED /[] Urgent Care (no appt availability in office) / [] Appointment(In office/virtual)/ []  [place] [facility]/ [] Home Care/ [] Refused Recommended Disposition /[] [place] [facility]/ []  Follow-up with PCP Additional Notes: Patient advised that she went to urgent care and was given a new prescription because she was having diarrhea. Patient was given Amoxicillin, Benzonatate, and Prednisone. Patient states that her cough is better, she is still not 100% better. Patient also has an inhaler to help with the cough/breathing. Patient states that she called and spoke with Dr Minna Merritts nurse and was advised to go to the hospital for possibly IV fluids. This was yesterday that she was told to go to the hospital.  Patient hasn't taken her antibiotics today so diarrhea is still there. Patient states some pneumonia in the right side bottom of the lung 4 days ago. Patient sounds unwell on the phone.  She states that she might call her daughter to see about getting a ride to the hospital.  Patient then states that she might see about getting an appointment with the urgent care she went to for follow up and if they think she needs IV fluids still then they will send her to the ER per patient. Patient is advised that the ER would be a good recommendation at this time due to being able to be assessed immediately by a provider, chest xray, and possibly IV fluids if needed. Patient states she didn't want to sit all day at the ER.  This RN advised her that it is  not recommended that she wait until tomorrow to be seen at any appointments and the ER would be able to provide IV fluids if they are needed, as recommended yesterday by another nurse at another office. Patient is advised that if anything gets worse to call 911 if she needs to and it's recommended that she gets seen by a provider today as soon as she can. She is advised that the emergency room and 911 are both there if she needs it.  Patient verbalized understanding.  Reason for Disposition  Patient sounds very sick or weak to the triager  Answer Assessment - Initial Assessment Questions 1. RESPIRATORY STATUS: "Describe your breathing?" (e.g., wheezing, shortness of breath, unable to speak, severe coughing)      Short of breath 2. ONSET: "When did this breathing problem begin?"      About a week ago 3. PATTERN "Does the difficult breathing come and go, or has it been constant since it started?"      Comes and goes but most of the time 4. SEVERITY: "How bad is your breathing?" (e.g., mild, moderate, severe)    - MILD: No SOB at rest, mild SOB with walking, speaks normally in sentences, can lie down, no retractions, pulse < 100.    - MODERATE: SOB at rest, SOB with minimal exertion and prefers to sit, cannot lie down flat, speaks in phrases, mild retractions, audible wheezing, pulse 100-120.    - SEVERE: Very SOB at rest, speaks in single words, struggling to breathe, sitting hunched forward, retractions, pulse >  120      Pt states worse with exertion mild-moderate 5. RECURRENT SYMPTOM: "Have you had difficulty breathing before?" If Yes, ask: "When was the last time?" and "What happened that time?"      Pt told pneumonia 4 days ago 6. CARDIAC HISTORY: "Do you have any history of heart disease?" (e.g., heart attack, angina, bypass surgery, angioplasty)      Yes---open heart surgery 7. LUNG HISTORY: "Do you have any history of lung disease?"  (e.g., pulmonary embolus, asthma, emphysema)     No 8.  CAUSE: "What do you think is causing the breathing problem?"      Recurrent sickness going on now 9. OTHER SYMPTOMS: "Do you have any other symptoms? (e.g., dizziness, runny nose, cough, chest pain, fever)     Runny nose,  10. O2 SATURATION MONITOR:  "Do you use an oxygen saturation monitor (pulse oximeter) at home?" If Yes, ask: "What is your reading (oxygen level) today?" "What is your usual oxygen saturation reading?" (e.g., 95%)       N/A 12. TRAVEL: "Have you traveled out of the country in the last month?" (e.g., travel history, exposures)       No  Answer Assessment - Initial Assessment Questions 1. ONSET: "When did the cough begin?"      About a week now 2. SEVERITY: "How bad is the cough today?"      Still coughing 3. SPUTUM: "Describe the color of your sputum" (none, dry cough; clear, white, yellow, green)     yellow 4. HEMOPTYSIS: "Are you coughing up any blood?" If so ask: "How much?" (flecks, streaks, tablespoons, etc.)     No 5. DIFFICULTY BREATHING: "Are you having difficulty breathing?" If Yes, ask: "How bad is it?" (e.g., mild, moderate, severe)    - MILD: No SOB at rest, mild SOB with walking, speaks normally in sentences, can lie down, no retractions, pulse < 100.    - MODERATE: SOB at rest, SOB with minimal exertion and prefers to sit, cannot lie down flat, speaks in phrases, mild retractions, audible wheezing, pulse 100-120.    - SEVERE: Very SOB at rest, speaks in single words, struggling to breathe, sitting hunched forward, retractions, pulse > 120       6. FEVER: "Do you have a fever?" If Yes, ask: "What is your temperature, how was it measured, and when did it start?"     Unsure  maybe at night 7. CARDIAC HISTORY: "Do you have any history of heart disease?" (e.g., heart attack, congestive heart failure)      Yes--Heart Disease Open heart surgery & a stroke per patient 8. LUNG HISTORY: "Do you have any history of lung disease?"  (e.g., pulmonary embolus, asthma,  emphysema)     No 9. PE RISK FACTORS: "Do you have a history of blood clots?" (or: recent major surgery, recent prolonged travel, bedridden)     No 10. OTHER SYMPTOMS: "Do you have any other symptoms?" (e.g., runny nose, wheezing, chest pain)       Runny nose, 12. TRAVEL: "Have you traveled out of the country in the last month?" (e.g., travel history, exposures)       No  Protocols used: Cough - Acute Productive-A-AH, Breathing Difficulty-A-AH

## 2024-01-15 NOTE — ED Triage Notes (Signed)
 Pt to ED for diarrhea (4-5 times/day) since 4 days after starting abx for PNA which was diagnosed at PCP. Called [facility], they told to come here for repeat chest xray and for IV fluids. Pt states she feels weak from all the diarrhea. Pt is ambulatory with steady gait.

## 2024-01-15 NOTE — ED Provider Notes (Signed)
 [facility] Provider Note    Event Date/Time   First MD Initiated Contact with Patient 01/15/24 1337     (approximate)   History   Diarrhea and Pneumonia   HPI  Morgan [name] is a 83 y.o. female who presents to the emergency department today because of concerns for diarrhea, possible dehydration reevaluation for pneumonia.  The patient states she was diagnosed with pneumonia by her primary care doctor.  She was started on antibiotics.  About 4 days ago she started having large volume diarrhea. She has not noticed any blood in it. Family has noticed increased weakness and some confusion.     Physical Exam   Triage Vital Signs: ED Triage Vitals  Encounter Vitals Group     BP 01/15/24 1228 (!) 183/76     Systolic BP Percentile --      Diastolic BP Percentile --      Pulse Rate 01/15/24 1228 63     Resp 01/15/24 1228 20     Temp 01/15/24 1228 97.8 F (36.6 C)     Temp Source 01/15/24 1228 Oral     SpO2 01/15/24 1228 96 %     Weight 01/15/24 1230 128 lb (58.1 kg)     Height 01/15/24 1230 5\' 3"  (1.6 m)     Head Circumference --      Peak Flow --      Pain Score 01/15/24 1226 0     Pain Loc --      Pain Education --      Exclude from Growth Chart --     Most recent vital signs: Vitals:   01/15/24 1228  BP: (!) 183/76  Pulse: 63  Resp: 20  Temp: 97.8 F (36.6 C)  SpO2: 96%   General: Awake, alert, oriented. CV:  Good peripheral perfusion. Regular rate and rhythm. Resp:  Normal effort. Lungs clear. Abd:  No distention.    ED Results / Procedures / Treatments   Labs (all labs ordered are listed, but only abnormal results are displayed) Labs Reviewed  LIPASE, BLOOD - Abnormal; Notable for the following components:      Result Value   Lipase 392 (*)    All other components within normal limits  COMPREHENSIVE METABOLIC PANEL - Abnormal; Notable for the following components:   Sodium 134 (*)    Glucose, Bld 298 (*)    BUN 43 (*)     Calcium 8.6 (*)    Total Protein 6.3 (*)    All other components within normal limits  C DIFFICILE QUICK SCREEN W PCR REFLEX    GASTROINTESTINAL PANEL BY PCR, STOOL (REPLACES STOOL CULTURE)  CBC  URINALYSIS, ROUTINE W REFLEX MICROSCOPIC     EKG  None   RADIOLOGY I independently interpreted and visualized the CXR. My interpretation: No pneumonia Radiology interpretation: Pending at time of sign out    PROCEDURES:  Critical Care performed: No   MEDICATIONS ORDERED IN ED: Medications - No data to display   IMPRESSION / MDM / ASSESSMENT AND PLAN / ED COURSE  I reviewed the triage vital signs and the nursing notes.                              Differential diagnosis includes, but is not limited to, c dif, functional diarrhea, medication side effect, gastroenteritis  Patient's presentation is most consistent with acute presentation with potential threat to life or bodily function.  The patient is on the cardiac monitor to evaluate for evidence of arrhythmia and/or significant heart rate changes.  Patient presented to the emergency department today because of concerns for possible dehydration in the setting of 4 days of diarrhea after being on antibiotics for pneumonia.  Patient's blood work with slightly elevated sugars.  No AKI.  Will however give IV fluids.  In addition we will put in GI stool studies given concern for possible C. difficile given recent antibiotic use.   Patient is feeling better after IV fluids. Do think patient will likely be able to be discharged, with possible medication for c-diff if stool sample is positive. Lipase was slightly elevated. No significant pain in the epigastric region, no vomiting. Think likely mild pancreatitis perhaps due to medication or illness. LFTs wnl. Do not feel any specific imaging is necessary of the pancreas at this time.       FINAL CLINICAL IMPRESSION(S) / ED DIAGNOSES   Final diagnoses:  Diarrhea, unspecified type      Note:  This document was prepared using Dragon voice recognition software and may include unintentional dictation errors.    Phineas Semen, MD 01/15/24 1520

## 2024-01-15 NOTE — Telephone Encounter (Signed)
 Noted.

## 2024-01-15 NOTE — Telephone Encounter (Signed)
 Unable to reach pt and pts husband  by phone and I did speak with Arline Asp (DPR signed) and she spoke with her mother earlier this morning and pt has had diarrhea for several days; pt is not eating and Arline Asp will go ck on her mom now and pt will either go to [facility] or [facility] ED per [place]. Sending note to Dr Ermalene Searing and [facility].

## 2024-01-16 ENCOUNTER — Telehealth: Payer: Self-pay

## 2024-01-16 NOTE — Transitions of Care (Post Inpatient/ED Visit) (Signed)
   01/16/2024  Name: Naz Denunzio MRN: [id number] DOB: 02-15-1941  Today's TOC FU Call Status: Today's TOC FU Call Status:: Unsuccessful Call (1st Attempt) Unsuccessful Call (1st Attempt) Date: 01/16/24  Attempted to reach the patient regarding the most recent Inpatient/ED visit.  Follow Up Plan: Additional outreach attempts will be made to reach the patient to complete the Transitions of Care (Post Inpatient/ED visit) call.   Signature Karena Addison, LPN [facility] Nurse Health Advisor Direct Dial [phone]

## 2024-01-16 NOTE — Telephone Encounter (Signed)
 Copied from CRM [id number]. Topic: General - Call Back - No Documentation >> Jan 16, 2024 11:56 AM Morgan [name] wrote: Reason for CRM:   Patient returning call from [facility], concerning TOC Follow up. Requested call back  CB# [phone]

## 2024-01-17 ENCOUNTER — Telehealth: Payer: Self-pay | Admitting: *Deleted

## 2024-01-17 NOTE — Telephone Encounter (Signed)
 Noted.

## 2024-01-17 NOTE — Transitions of Care (Post Inpatient/ED Visit) (Signed)
   01/17/2024  Name: Lylah Lantis MRN: [id number] DOB: [birth year]  Today's TOC FU Call Status: Today's TOC FU Call Status:: Unsuccessful Call (2nd Attempt) Unsuccessful Call (1st Attempt) Date: 01/16/24 Unsuccessful Call (2nd Attempt) Date: 01/17/24  Attempted to reach the patient regarding the most recent Inpatient/ED visit.  Follow Up Plan: Additional outreach attempts will be made to reach the patient to complete the Transitions of Care (Post Inpatient/ED visit) call.   Signature  Karena Addison, LPN [facility] Nurse Health Advisor Direct Dial [phone]

## 2024-01-17 NOTE — Telephone Encounter (Signed)
 Copied from CRM [id number]. Topic: Clinical - Medical Advice >> Jan 17, 2024 10:05 AM Orinda Kenner C wrote: Reason for CRM: Patient's spouse Jomarie Longs [phone] wants to speak with the nurse, sick, ongoing diarrhea, has a bunch of labs, and had IV fluids, and went to the ER this Tuesday 01/15/24. Diagnosis with pneumonia last week and cough is somewhat better. Patient was told to contact the office to follow up if not better, patient wants to speak Lupita Leash or [name] today.  Per CAL, schedule an appointment, or send a crm to clinical. Patient cannot come into the office today due to transportation, does not know to do, she really frustrated with still having diarrhea. Please advise and call back today.

## 2024-01-17 NOTE — Telephone Encounter (Signed)
 I spoke with pt and pt notified as instructed and voiced understanding.  Pt said she is drinking a lot of water;pt had last diarrhea episode about 1/2 hr ago; pt said in last 12 hrs has had 5 -6 episodes of diarrhea. Pt has no upper abd pain., pt does have lower abd soreness due to diarrhea. Pt has slight dry mouth but no dizziness and no N&V. Pt scheduled appt with Dr Ermalene Searing on 01/18/24 at 11:40 with UC & ED precautions  sending FYI to Dr Ermalene Searing.

## 2024-01-17 NOTE — Telephone Encounter (Signed)
 I reviewed recent ED visit.  It appears her GI pathogen panel returned showing norovirus.  This is likely why she has not significant diarrhea. This will resolve with time but she needs to push hydration. I have her make an appointment for ER follow-up with me tomorrow. With an infection it is not recommended to use antidiarrheal medication.  She also had elevated lipase suggesting possible mild pancreatitis is she having any upper abdominal pain?  I will send this to triage to obtain further information and to rule out need for return to ER if dehydated.

## 2024-01-18 ENCOUNTER — Ambulatory Visit: Admitting: Family Medicine

## 2024-01-18 ENCOUNTER — Encounter: Payer: Self-pay | Admitting: Family Medicine

## 2024-01-18 VITALS — BP 150/80 | HR 82 | Temp 97.3°F | Ht 61.5 in | Wt 123.1 lb

## 2024-01-18 DIAGNOSIS — E1159 Type 2 diabetes mellitus with other circulatory complications: Secondary | ICD-10-CM | POA: Diagnosis not present

## 2024-01-18 DIAGNOSIS — A09 Infectious gastroenteritis and colitis, unspecified: Secondary | ICD-10-CM | POA: Diagnosis not present

## 2024-01-18 DIAGNOSIS — R531 Weakness: Secondary | ICD-10-CM

## 2024-01-18 DIAGNOSIS — R748 Abnormal levels of other serum enzymes: Secondary | ICD-10-CM | POA: Diagnosis not present

## 2024-01-18 DIAGNOSIS — A0811 Acute gastroenteropathy due to Norwalk agent: Secondary | ICD-10-CM | POA: Diagnosis not present

## 2024-01-18 DIAGNOSIS — I152 Hypertension secondary to endocrine disorders: Secondary | ICD-10-CM

## 2024-01-18 LAB — COMPREHENSIVE METABOLIC PANEL
ALT: 39 U/L — ABNORMAL HIGH (ref 0–35)
AST: 26 U/L (ref 0–37)
Albumin: 4 g/dL (ref 3.5–5.2)
Alkaline Phosphatase: 79 U/L (ref 39–117)
BUN: 22 mg/dL (ref 6–23)
CO2: 31 meq/L (ref 19–32)
Calcium: 9.8 mg/dL (ref 8.4–10.5)
Chloride: 98 meq/L (ref 96–112)
Creatinine, Ser: 0.88 mg/dL (ref 0.40–1.20)
GFR: 61.46 mL/min (ref >60.00)
Glucose, Bld: 224 mg/dL — ABNORMAL HIGH (ref 70–99)
Potassium: 4.2 meq/L (ref 3.5–5.1)
Sodium: 140 meq/L (ref 135–145)
Total Bilirubin: 0.6 mg/dL (ref 0.2–1.2)
Total Protein: 6.5 g/dL (ref 6.0–8.3)

## 2024-01-18 LAB — CBC WITH DIFFERENTIAL/PLATELET
Basophils Absolute: 0 10*3/uL (ref 0.0–0.1)
Basophils Relative: 0.2 % (ref 0.0–3.0)
Eosinophils Absolute: 0.2 10*3/uL (ref 0.0–0.7)
Eosinophils Relative: 2.2 % (ref 0.0–5.0)
HCT: 44.1 % (ref 36.0–46.0)
Hemoglobin: 14.7 g/dL (ref 12.0–15.0)
Lymphocytes Relative: 21.6 % (ref 12.0–46.0)
Lymphs Abs: 2.3 10*3/uL (ref 0.7–4.0)
MCHC: 33.4 g/dL (ref 30.0–36.0)
MCV: 92.7 fl (ref 78.0–100.0)
Monocytes Absolute: 0.9 10*3/uL (ref 0.1–1.0)
Monocytes Relative: 8.3 % (ref 3.0–12.0)
Neutro Abs: 7.2 10*3/uL (ref 1.4–7.7)
Neutrophils Relative %: 67.7 % (ref 43.0–77.0)
Platelets: 417 10*3/uL — ABNORMAL HIGH (ref 150.0–400.0)
RBC: 4.75 Mil/uL (ref 3.87–5.11)
RDW: 12.5 % (ref 11.5–15.5)
WBC: 10.7 10*3/uL — ABNORMAL HIGH (ref 4.0–10.5)

## 2024-01-18 LAB — LIPASE: Lipase: 52 U/L (ref 11.0–59.0)

## 2024-01-18 NOTE — Assessment & Plan Note (Signed)
 Chronic, encouraged her to follow blood sugar at home and continue low-carb diet. She is currently holding metformin. Depending on blood sugar with labs performed today we will consider having her start back at least some of this while she is dealing with the acute illness.

## 2024-01-18 NOTE — Assessment & Plan Note (Signed)
 Acute, secondary to norovirus, likely worsened due to recent antibiotics. Negative C. difficile. Current weakness likely secondary to persistent diarrhea.  Diarrhea does seem to be decreasing in frequency and intensity over the last couple days.  I encouraged her to continue hydration and following her blood sugar off medication.  We will evaluate for electrolyte imbalance, kidney liver issues with repeat labs.

## 2024-01-18 NOTE — Assessment & Plan Note (Signed)
 Chronic, blood pressure currently controlled despite holding blood pressure medication.  She will continue to hold blood pressure medication and less blood pressure is increasing above 150/90.

## 2024-01-18 NOTE — Progress Notes (Signed)
 Patient ID: Morgan [name], female    DOB: Dec 12, 1940, 83 y.o.   MRN: [id number]  This visit was conducted in person.  BP (!) 150/80 (BP Location: Right Arm, Patient Position: Sitting, Cuff Size: Normal)   Pulse 82   Temp (!) 97.3 F (36.3 C) (Temporal)   Ht 5' 1.5" (1.562 m)   Wt 123 lb 2 oz (55.8 kg)   SpO2 95%   BMI 22.89 kg/m    CC:  Chief Complaint  Patient presents with   Diarrhea    Subjective:   HPI: Morgan [name] is a 83 y.o. female presenting on 01/18/2024 for Diarrhea  Acute diarrhea due to Norovirus  Saw Dr. Alphonsus Sias on January 08, 2024 for acute nonrecurrent pansinusitis started on  Augmentin 8 twice daily She was seen at [facility] care by Dr. Chalmers Guest on January 11, 2024 for 2 weeks of productive cough with yellow sputum.  Diagnosed with developing right lower lobe pneumonia  ( seen on CXR) as well as diarrhea possibly secondary to Augmentin. Augmentin stopped and antibiotics changed to Avelox 400 mg daily for 10 days.  Treated with prednisone course for 5 days given benzonatate and albuterol to use as needed =  Improved cough and congestion but conitnue diarrhea  Reviewed recent ED note from January 15, 2024 Diagnosed with mild pancreatitis with elevated lipase at 348  Given IVF. Chest x-ray was within normal limits C. difficile test negative GI pathogen panel returned showing norovirus. CBC was within normal limits    Today she reports  continued diarrhea, gradually decreasing some over time.  BM 3 yesterday, watery.  Occ sweaty, cold.. no fever measured.  No N/V. Good po intake.. having 3-4 glasses of ice chips,  Eating chicken soup, lots liquids.  No blood in stool. She is feeling weak, rare abdominal cramping. OCc lighthededness.    She has been holding her medicaitons   Relevant past medical, surgical, family and social history reviewed and updated as indicated. Interim medical history since our last visit reviewed. Allergies and medications  reviewed and updated. Outpatient Medications Prior to Visit  Medication Sig Dispense Refill   Alum & Mag Hydroxide-Simeth (MAALOX REGULAR STRENGTH PO) Take by mouth daily as needed. Patient takes 1 capful daily of the powder formulation.     aspirin EC 81 MG tablet Take 1 tablet (81 mg total) by mouth daily.     atorvastatin (LIPITOR) 80 MG tablet TAKE 1 TABLET BY MOUTH EVERY DAY 90 tablet 0   BLINK TEARS 0.25 % SOLN Place 1 drop into both eyes daily as needed (dry/irritated eyes.).     calcium-vitamin D (OSCAL WITH D) 500-200 MG-UNIT tablet Take 1 tablet by mouth 2 (two) times daily.     chlorthalidone (HYGROTON) 25 MG tablet TAKE 1 TABLET BY MOUTH EVERY DAY 90 tablet 3   CINNAMON PO Take 1,000 mg by mouth in the morning and at bedtime.     ferrous sulfate 325 (65 FE) MG tablet Take 325 mg by mouth daily.     gabapentin (NEURONTIN) 300 MG capsule TAKE ONE CAPSULE IN THE MORNING, ONE CAPSULE IN THE EVENING AND 3 CAPSULES AT BEDTIME. 150 capsule 0   Lancet Device MISC Use to check blood sugar twice daily. 1 each 0   Lancets (FREESTYLE) lancets Use to check blood sugar twice daily 100 each 11   metFORMIN (GLUCOPHAGE-XR) 500 MG 24 hr tablet TAKE 2 TABLETS BY MOUTH TWICE A DAY 360 tablet 3   methylcellulose (CITRUCEL)  oral powder Use as directed daily     metoprolol succinate (TOPROL-XL) 100 MG 24 hr tablet TAKE 1 TABLET BY MOUTH EVERY DAY WITH OR IMMEDIATELY FOLLOWING A MEAL 90 tablet 3   Multiple Vitamins-Minerals (PRESERVISION AREDS PO) Take 1 tablet by mouth 2 (two) times daily. Reported on 12/23/2015     nitroGLYCERIN (NITROSTAT) 0.4 MG SL tablet PLACE 1 TABLET (0.4 MG TOTAL) UNDER THE TONGUE EVERY 5 (FIVE) MINUTES AS NEEDED FOR CHEST PAIN 25 tablet 3   ONETOUCH ULTRA test strip USE TO CHECK BLOOD SUGAR TWICE DAILY 150 strip 3   Probiotic Product (ALIGN PO) Take by mouth at bedtime.     QUEtiapine (SEROQUEL) 25 MG tablet TAKE 1 TABLET BY MOUTH EVERYDAY AT BEDTIME 90 tablet 1   rOPINIRole  (REQUIP) 1 MG tablet 1/2 TABLET DURING THE DAY, 2 TABLETS AT BEDTIME 270 tablet 3   venlafaxine XR (EFFEXOR-XR) 75 MG 24 hr capsule TAKE 1 CAPSULE BY MOUTH DAILY WITH BREAKFAST. 90 capsule 1   Vibegron (GEMTESA) 75 MG TABS Take 1 tablet (75 mg total) by mouth daily. 30 tablet 11   vitamin B-12 (CYANOCOBALAMIN) 1000 MCG tablet Take 1,000 mcg by mouth daily.     amoxicillin-clavulanate (AUGMENTIN) 875-125 MG tablet Take 1 tablet by mouth 2 (two) times daily. 14 tablet 0   benzonatate (TESSALON) 200 MG capsule Take 1 capsule (200 mg total) by mouth 3 (three) times daily as needed for cough. 60 capsule 0   No facility-administered medications prior to visit.     Per HPI unless specifically indicated in ROS section below Review of Systems  Constitutional:  Negative for fatigue and fever.  HENT:  Negative for congestion.   Eyes:  Negative for pain.  Respiratory:  Negative for cough and shortness of breath.   Cardiovascular:  Negative for chest pain, palpitations and leg swelling.  Gastrointestinal:  Positive for diarrhea. Negative for abdominal pain.  Genitourinary:  Negative for dysuria and vaginal bleeding.  Musculoskeletal:  Negative for back pain.  Neurological:  Negative for syncope, light-headedness and headaches.  Psychiatric/Behavioral:  Negative for dysphoric mood.    Objective:  BP (!) 150/80 (BP Location: Right Arm, Patient Position: Sitting, Cuff Size: Normal)   Pulse 82   Temp (!) 97.3 F (36.3 C) (Temporal)   Ht 5' 1.5" (1.562 m)   Wt 123 lb 2 oz (55.8 kg)   SpO2 95%   BMI 22.89 kg/m   Wt Readings from Last 3 Encounters:  01/18/24 123 lb 2 oz (55.8 kg)  01/15/24 128 lb (58.1 kg)  01/08/24 131 lb (59.4 kg)      Physical Exam Constitutional:      General: She is not in acute distress.    Appearance: Normal appearance. She is well-developed. She is not ill-appearing or toxic-appearing.  HENT:     Head: Normocephalic.     Right Ear: Hearing, tympanic membrane, ear  canal and external ear normal. Tympanic membrane is not erythematous, retracted or bulging.     Left Ear: Hearing, tympanic membrane, ear canal and external ear normal. Tympanic membrane is not erythematous, retracted or bulging.     Nose: No mucosal edema or rhinorrhea.     Right Sinus: No maxillary sinus tenderness or frontal sinus tenderness.     Left Sinus: No maxillary sinus tenderness or frontal sinus tenderness.     Mouth/Throat:     Pharynx: Uvula midline.  Eyes:     General: Lids are normal. Lids are everted, no foreign  bodies appreciated.     Conjunctiva/sclera: Conjunctivae normal.     Pupils: Pupils are equal, round, and reactive to light.  Neck:     Thyroid: No thyroid mass or thyromegaly.     Vascular: No carotid bruit.     Trachea: Trachea normal.  Cardiovascular:     Rate and Rhythm: Normal rate and regular rhythm.     Pulses: Normal pulses.     Heart sounds: Normal heart sounds, S1 normal and S2 normal. No murmur heard.    No friction rub. No gallop.  Pulmonary:     Effort: Pulmonary effort is normal. No tachypnea or respiratory distress.     Breath sounds: Normal breath sounds. No decreased breath sounds, wheezing, rhonchi or rales.  Abdominal:     General: Bowel sounds are normal.     Palpations: Abdomen is soft.     Tenderness: There is no abdominal tenderness.  Musculoskeletal:     Cervical back: Normal range of motion and neck supple.  Skin:    General: Skin is warm and dry.     Findings: No rash.  Neurological:     Mental Status: She is alert.  Psychiatric:        Mood and Affect: Mood is not anxious or depressed.        Speech: Speech normal.        Behavior: Behavior normal. Behavior is cooperative.        Thought Content: Thought content normal.        Judgment: Judgment normal.       Results for orders placed or performed during the hospital encounter of 01/15/24  Lipase, blood   Collection Time: 01/15/24 12:31 PM  Result Value Ref Range    Lipase 392 (H) 11 - 51 U/L  Comprehensive metabolic panel   Collection Time: 01/15/24 12:31 PM  Result Value Ref Range   Sodium 134 (L) 135 - 145 mmol/L   Potassium 4.6 3.5 - 5.1 mmol/L   Chloride 100 98 - 111 mmol/L   CO2 23 22 - 32 mmol/L   Glucose, Bld 298 (H) 70 - 99 mg/dL   BUN 43 (H) 8 - 23 mg/dL   Creatinine, Ser 1.61 0.44 - 1.00 mg/dL   Calcium 8.6 (L) 8.9 - 10.3 mg/dL   Total Protein 6.3 (L) 6.5 - 8.1 g/dL   Albumin 3.6 3.5 - 5.0 g/dL   AST 26 15 - 41 U/L   ALT 36 0 - 44 U/L   Alkaline Phosphatase 74 38 - 126 U/L   Total Bilirubin 0.8 0.0 - 1.2 mg/dL   GFR, Estimated >09 >60 mL/min   Anion gap 11 5 - 15  CBC   Collection Time: 01/15/24 12:31 PM  Result Value Ref Range   WBC 8.4 4.0 - 10.5 K/uL   RBC 4.24 3.87 - 5.11 MIL/uL   Hemoglobin 12.9 12.0 - 15.0 g/dL   HCT 45.4 09.8 - 11.9 %   MCV 94.1 80.0 - 100.0 fL   MCH 30.4 26.0 - 34.0 pg   MCHC 32.3 30.0 - 36.0 g/dL   RDW 14.7 82.9 - 56.2 %   Platelets 375 150 - 400 K/uL   nRBC 0.0 0.0 - 0.2 %  C Difficile Quick Screen w PCR reflex   Collection Time: 01/15/24  2:47 PM   Specimen: STOOL  Result Value Ref Range   C Diff antigen NEGATIVE NEGATIVE   C Diff toxin NEGATIVE NEGATIVE   C Diff interpretation No C.  difficile detected.   Gastrointestinal Panel by PCR , Stool   Collection Time: 01/15/24  2:47 PM   Specimen: STOOL  Result Value Ref Range   Campylobacter species NOT DETECTED NOT DETECTED   Plesimonas shigelloides NOT DETECTED NOT DETECTED   Salmonella species NOT DETECTED NOT DETECTED   Yersinia enterocolitica NOT DETECTED NOT DETECTED   Vibrio species NOT DETECTED NOT DETECTED   Vibrio cholerae NOT DETECTED NOT DETECTED   Enteroaggregative E coli (EAEC) NOT DETECTED NOT DETECTED   Enteropathogenic E coli (EPEC) NOT DETECTED NOT DETECTED   Enterotoxigenic E coli (ETEC) NOT DETECTED NOT DETECTED   Shiga like toxin producing E coli (STEC) NOT DETECTED NOT DETECTED   Shigella/Enteroinvasive E coli (EIEC) NOT  DETECTED NOT DETECTED   Cryptosporidium NOT DETECTED NOT DETECTED   Cyclospora cayetanensis NOT DETECTED NOT DETECTED   Entamoeba histolytica NOT DETECTED NOT DETECTED   Giardia lamblia NOT DETECTED NOT DETECTED   Adenovirus F40/41 NOT DETECTED NOT DETECTED   Astrovirus NOT DETECTED NOT DETECTED   Norovirus GI/GII DETECTED (A) NOT DETECTED   Rotavirus A NOT DETECTED NOT DETECTED   Sapovirus (I, II, IV, and V) NOT DETECTED NOT DETECTED   *Note: Due to a large number of results and/or encounters for the requested time period, some results have not been displayed. A complete set of results can be found in Results Review.    Assessment and Plan  Weakness -     Comprehensive metabolic panel -     Lipase -     CBC with Differential/Platelet  Diarrhea of infectious origin Assessment & Plan: Acute, secondary to norovirus, likely worsened due to recent antibiotics. Negative C. difficile. Current weakness likely secondary to persistent diarrhea.  Diarrhea does seem to be decreasing in frequency and intensity over the last couple days.  I encouraged her to continue hydration and following her blood sugar off medication.  We will evaluate for electrolyte imbalance, kidney liver issues with repeat labs.   Enteritis due to Norovirus  Elevated lipase Assessment & Plan: Acute, no epigastric pain on exam or per report.  Possible acute phase reactant.  Will reevaluate with repeat test.   Hypertension associated with diabetes [facility]) Assessment & Plan: Chronic, blood pressure currently controlled despite holding blood pressure medication.  She will continue to hold blood pressure medication and less blood pressure is increasing above 150/90.   Type 2 diabetes mellitus with vascular disease (HCC) Assessment & Plan: Chronic, encouraged her to follow blood sugar at home and continue low-carb diet. She is currently holding metformin. Depending on blood sugar with labs performed today we will  consider having her start back at least some of this while she is dealing with the acute illness.     No follow-ups on file.   Kerby Nora, MD

## 2024-01-18 NOTE — Assessment & Plan Note (Signed)
 Acute, no epigastric pain on exam or per report.  Possible acute phase reactant.  Will reevaluate with repeat test.

## 2024-01-21 ENCOUNTER — Telehealth: Payer: Self-pay | Admitting: *Deleted

## 2024-01-21 ENCOUNTER — Telehealth: Payer: Self-pay

## 2024-01-21 DIAGNOSIS — I152 Hypertension secondary to endocrine disorders: Secondary | ICD-10-CM

## 2024-01-21 NOTE — Progress Notes (Signed)
 Complex Care Management Note Care Guide Note  01/21/2024 Name: Morgan [name] MRN: [id number] DOB: Jan 12, 1941   Complex Care Management Outreach Attempts: An unsuccessful telephone outreach was attempted today to offer the patient information about available complex care management services.  Follow Up Plan:  Additional outreach attempts will be made to offer the patient complex care management information and services.   Encounter Outcome:  No Answer  Gwenevere Ghazi  [facility]  [facility] Guide  Direct Dial: [phone]  Fax [phone]

## 2024-01-21 NOTE — Progress Notes (Signed)
 Complex Care Management Note  Care Guide Note 01/21/2024 Name: Morgan [name] MRN: [id number] DOB: [birth year]  Morgan [name] is a 83 y.o. year old female who sees Excell Seltzer, MD for primary care. I reached out to [facility] by phone today to offer complex care management services.  Morgan [name] was given information about Complex Care Management services today including:   The Complex Care Management services include support from the care team which includes your Nurse Care Manager, Clinical Social Worker, or Pharmacist.  The Complex Care Management team is here to help remove barriers to the health concerns and goals most important to you. Complex Care Management services are voluntary, and the patient may decline or stop services at any time by request to their care team member.   Complex Care Management Consent Status: Patient agreed to services and verbal consent obtained.   Follow up plan:  Telephone appointment with complex care management team member scheduled for:  01/22/24  Encounter Outcome:  Patient Scheduled  Morgan [name]  [facility]  [facility] Guide  Direct Dial: [phone]  Fax [phone]

## 2024-01-22 ENCOUNTER — Ambulatory Visit: Payer: Self-pay | Admitting: Licensed Clinical Social Worker

## 2024-01-22 NOTE — Patient Instructions (Signed)
 Visit Information  Thank you for taking time to visit with me today. Please don't hesitate to contact me if I can be of assistance to you.   Following are the goals we discussed today:   Goals Addressed             This Visit's Progress    Patient stated she has had diarrhea for nearly a month. She has talked with her PCP about this issue. Client does walk with use of a cane.  She is trying also to help with the needs of her spouse.       Interventions:    Spoke today with client via phone about client needs and status Client said she and her spouse reside at [facility] residential community in [place], [place] Client said she walks with use of a cane.   Client has had diarrhea for about one month. She has talked with her PCP about this issue. She is taking medications as prescribed and trying to follow recommendation of her PCP She is able to drive her car as needed. She said her spouse also drives a car as needed Discussed medication procurement of client Discussed program support for client with RN, LCSW, Pharmacist Provided counseling support for client Discussed pain issues. Client spoke of low back pain Discussed sleeping issues. Client has OSA. She has C-PAP machine to use as needed to help her sleep Client said she has support from her spouse and from her daughter Discussed mood of client. Client said she is a little sad recently due to ongoing diarrhea issues. Discussed client support with PCP, Dr. Excell Seltzer Client said she sees cardiologist every 6 months for support. She sees neurologist every 6 months for support Discussed vision needs. Discussed appetite of client Client said she has Diabetes ; she said she has a meter and does finger sticks regularly to check her sugar levels Thanked client for phone call today with LCSW Encouraged client to call LCSW as needed for SW support at [phone].          Our next appointment is by telephone on 03/03/24 a 3:30 PM    Please call the care guide team at [phone] if you need to cancel or reschedule your appointment.   If you are experiencing a Mental Health or Behavioral Health Crisis or need someone to talk to, please go to [facility] [address], [place] [phone])   The patient verbalized understanding of instructions, educational materials, and care plan provided today and DECLINED offer to receive copy of patient instructions, educational materials, and care plan.   The patient has been provided with contact information for the care management team and has been advised to call with any health related questions or concerns.    Lorna Few  MSW, LCSW [facility] Licensed Clinical Social Worker Direct Dial:  [phone] Fax:  [phone] Website:  [email or web address]

## 2024-01-22 NOTE — Transitions of Care (Post Inpatient/ED Visit) (Signed)
   01/22/2024  Name: Donalda Job MRN: [id number] DOB: 1941-07-23  Today's TOC FU Call Status: Today's TOC FU Call Status:: Unsuccessful Call (2nd Attempt) Unsuccessful Call (1st Attempt) Date: 01/16/24 Unsuccessful Call (2nd Attempt) Date: 01/17/24  Attempted to reach the patient regarding the most recent Inpatient/ED visit.  Follow Up Plan: No further outreach attempts will be made at this time. We have been unable to contact the patient. Patient already seen in office Signature Karena Addison, LPN [facility] Nurse Health Advisor Direct Dial [phone]

## 2024-01-22 NOTE — Patient Outreach (Signed)
 Care Coordination   Initial Visit Note   01/22/2024 Name: Morgan [name] MRN: [id number] DOB: 25-Apr-1941  Morgan [name] is a 83 y.o. year old female who sees Excell Seltzer, MD for primary care. I spoke with  [facility] by phone today.  What matters to the patients health and wellness today?  Patient stated she has had diarrhea for nearly a month. She has talked with her PCP about this issue. Client does walk with use of a cane.  She is trying also to help with the needs of her spouse.     Goals Addressed             This Visit's Progress    Patient stated she has had diarrhea for nearly a month. She has talked with her PCP about this issue. Client does walk with use of a cane.  She is trying also to help with the needs of her spouse.       Interventions:    Spoke today with client via phone about client needs and status Client said she and her spouse reside at [name] in [place], [place] Client said she walks with use of a cane.   Client has had diarrhea for about one month. She has talked with her PCP about this issue. She is taking medications as prescribed and trying to follow recommendation of her PCP She is able to drive her car as needed. She said her spouse also drives a car as needed Discussed medication procurement of client Discussed program support for client with RN, LCSW, Pharmacist Provided counseling support for client Discussed pain issues. Client spoke of low back pain Discussed sleeping issues. Client has OSA. She has C-PAP machine to use as needed to help her sleep Client said she has support from her spouse and from her daughter Discussed mood of client. Client said she is a little sad recently due to ongoing diarrhea issues. Discussed client support with PCP, Dr. Excell Seltzer Client said she sees cardiologist every 6 months for support. She sees neurologist every 6 months for support Discussed vision needs. Discussed appetite of  client Client said she has Diabetes ; she said she has a meter and does finger sticks regularly to check her sugar levels Thanked client for phone call today with LCSW Encouraged client to call LCSW as needed for SW support at [phone].          SDOH assessments and interventions completed:  Yes  SDOH Interventions Today    Flowsheet Row Most Recent Value  SDOH Interventions   Depression Interventions/Treatment  Medication, Counseling  Physical Activity Interventions Other (Comments)  [client uses a cane to help her walk]  Stress Interventions Provide Counseling  [client has stress in managing her medical needs. She has stress in manging needs of her spouse]        Care Coordination Interventions:  Yes, provided   Interventions Today    Flowsheet Row Most Recent Value  Chronic Disease   Chronic disease during today's visit Other  [spoke with client about client needs]  General Interventions   General Interventions Discussed/Reviewed General Interventions Discussed, Community Resources  Education Interventions   Education Provided Provided Education  Provided Verbal Education On [employer]  Mental Health Interventions   Mental Health Discussed/Reviewed Coping Strategies  [client has some anxiety and stress issues. she is trying to use coping skills to manage stress issues.]  Nutrition Interventions   Nutrition Discussed/Reviewed Nutrition Discussed  Pharmacy Interventions  Pharmacy Dicussed/Reviewed Pharmacy Topics Discussed  Safety Interventions   Safety Discussed/Reviewed Fall Risk        Follow up plan: Follow up call scheduled for 03/03/24 at 3:30 PM     Encounter Outcome:  Patient Visit Completed    Lorna Few  MSW, LCSW [place] Licensed Clinical Social Worker Direct Dial:  [phone] Fax:  [phone] Website:  [email or web address]

## 2024-01-24 ENCOUNTER — Other Ambulatory Visit: Payer: Self-pay | Admitting: Family Medicine

## 2024-01-24 NOTE — Telephone Encounter (Signed)
 lvm for pt to call office to schedule appt.

## 2024-01-24 NOTE — Telephone Encounter (Signed)
 Last office visit 01/18/2024 for diarrhea/weakness.  Last refilled 10/19/2023 for #150 with no refills.  Next Appt: No future appointments with PCP.   Please schedule CPE with fasting labs prior with [name] after 03/03/2024.

## 2024-01-25 NOTE — Telephone Encounter (Signed)
 Spoke to pt, scheduled cpe for 02/26/24

## 2024-01-29 ENCOUNTER — Telehealth: Payer: Self-pay | Admitting: Family Medicine

## 2024-01-29 DIAGNOSIS — E1159 Type 2 diabetes mellitus with other circulatory complications: Secondary | ICD-10-CM

## 2024-01-29 NOTE — Telephone Encounter (Signed)
 Sent MyChart message regarding microalbumin.  She will retest with upcoming physical labs February 19, 2024

## 2024-02-01 ENCOUNTER — Telehealth: Payer: Self-pay | Admitting: *Deleted

## 2024-02-01 DIAGNOSIS — E1159 Type 2 diabetes mellitus with other circulatory complications: Secondary | ICD-10-CM

## 2024-02-01 DIAGNOSIS — E1169 Type 2 diabetes mellitus with other specified complication: Secondary | ICD-10-CM

## 2024-02-01 NOTE — Telephone Encounter (Signed)
-----   Message from Alvina Chou sent at 02/01/2024 10:03 AM EDT ----- Regarding: Lab orders for Tue, 4.8.25 Patient is scheduled for CPX labs, please order future labs, Thanks , Camelia Eng

## 2024-02-02 ENCOUNTER — Ambulatory Visit
Admission: RE | Admit: 2024-02-02 | Discharge: 2024-02-02 | Disposition: A | Discharge status: Home / Self Care | Source: Ambulatory Visit | Attending: Orthopedic Surgery | Admitting: Orthopedic Surgery

## 2024-02-02 DIAGNOSIS — S22080A Wedge compression fracture of T11-T12 vertebra, initial encounter for closed fracture: Secondary | ICD-10-CM

## 2024-02-02 DIAGNOSIS — M47816 Spondylosis without myelopathy or radiculopathy, lumbar region: Secondary | ICD-10-CM | POA: Diagnosis not present

## 2024-02-02 DIAGNOSIS — S32030A Wedge compression fracture of third lumbar vertebra, initial encounter for closed fracture: Secondary | ICD-10-CM

## 2024-02-02 DIAGNOSIS — M48061 Spinal stenosis, lumbar region without neurogenic claudication: Secondary | ICD-10-CM | POA: Diagnosis not present

## 2024-02-02 DIAGNOSIS — M47814 Spondylosis without myelopathy or radiculopathy, thoracic region: Secondary | ICD-10-CM | POA: Diagnosis not present

## 2024-02-05 ENCOUNTER — Other Ambulatory Visit: Payer: Self-pay

## 2024-02-06 DIAGNOSIS — G4489 Other headache syndrome: Secondary | ICD-10-CM | POA: Diagnosis not present

## 2024-02-06 DIAGNOSIS — W19XXXD Unspecified fall, subsequent encounter: Secondary | ICD-10-CM | POA: Diagnosis not present

## 2024-02-06 DIAGNOSIS — R2689 Other abnormalities of gait and mobility: Secondary | ICD-10-CM | POA: Diagnosis not present

## 2024-02-06 DIAGNOSIS — M2170 Unequal limb length (acquired), unspecified site: Secondary | ICD-10-CM | POA: Diagnosis not present

## 2024-02-06 DIAGNOSIS — R4189 Other symptoms and signs involving cognitive functions and awareness: Secondary | ICD-10-CM | POA: Diagnosis not present

## 2024-02-06 DIAGNOSIS — S22000S Wedge compression fracture of unspecified thoracic vertebra, sequela: Secondary | ICD-10-CM | POA: Diagnosis not present

## 2024-02-06 DIAGNOSIS — R634 Abnormal weight loss: Secondary | ICD-10-CM | POA: Diagnosis not present

## 2024-02-11 DIAGNOSIS — H35372 Puckering of macula, left eye: Secondary | ICD-10-CM | POA: Diagnosis not present

## 2024-02-13 DIAGNOSIS — L812 Freckles: Secondary | ICD-10-CM | POA: Diagnosis not present

## 2024-02-13 DIAGNOSIS — D485 Neoplasm of uncertain behavior of skin: Secondary | ICD-10-CM | POA: Diagnosis not present

## 2024-02-13 DIAGNOSIS — D225 Melanocytic nevi of trunk: Secondary | ICD-10-CM | POA: Diagnosis not present

## 2024-02-13 DIAGNOSIS — L821 Other seborrheic keratosis: Secondary | ICD-10-CM | POA: Diagnosis not present

## 2024-02-13 DIAGNOSIS — D2262 Melanocytic nevi of left upper limb, including shoulder: Secondary | ICD-10-CM | POA: Diagnosis not present

## 2024-02-13 DIAGNOSIS — Z85828 Personal history of other malignant neoplasm of skin: Secondary | ICD-10-CM | POA: Diagnosis not present

## 2024-02-13 DIAGNOSIS — D2271 Melanocytic nevi of right lower limb, including hip: Secondary | ICD-10-CM | POA: Diagnosis not present

## 2024-02-13 DIAGNOSIS — D2272 Melanocytic nevi of left lower limb, including hip: Secondary | ICD-10-CM | POA: Diagnosis not present

## 2024-02-13 DIAGNOSIS — D2261 Melanocytic nevi of right upper limb, including shoulder: Secondary | ICD-10-CM | POA: Diagnosis not present

## 2024-02-19 ENCOUNTER — Other Ambulatory Visit (INDEPENDENT_AMBULATORY_CARE_PROVIDER_SITE_OTHER)

## 2024-02-19 ENCOUNTER — Encounter: Payer: Self-pay | Admitting: Family Medicine

## 2024-02-19 DIAGNOSIS — E1169 Type 2 diabetes mellitus with other specified complication: Secondary | ICD-10-CM

## 2024-02-19 DIAGNOSIS — E785 Hyperlipidemia, unspecified: Secondary | ICD-10-CM

## 2024-02-19 DIAGNOSIS — E1159 Type 2 diabetes mellitus with other circulatory complications: Secondary | ICD-10-CM

## 2024-02-19 LAB — LIPID PANEL
Cholesterol: 110 mg/dL (ref 0–200)
HDL: 42.9 mg/dL (ref >39.00)
LDL Cholesterol: 44 mg/dL (ref 0–99)
NonHDL: 66.94
Total CHOL/HDL Ratio: 3
Triglycerides: 114 mg/dL (ref 0.0–149.0)
VLDL: 22.8 mg/dL (ref 0.0–40.0)

## 2024-02-19 LAB — COMPREHENSIVE METABOLIC PANEL WITH GFR
ALT: 41 U/L — ABNORMAL HIGH (ref 0–35)
AST: 24 U/L (ref 0–37)
Albumin: 4 g/dL (ref 3.5–5.2)
Alkaline Phosphatase: 91 U/L (ref 39–117)
BUN: 30 mg/dL — ABNORMAL HIGH (ref 6–23)
CO2: 32 meq/L (ref 19–32)
Calcium: 9.2 mg/dL (ref 8.4–10.5)
Chloride: 100 meq/L (ref 96–112)
Creatinine, Ser: 0.82 mg/dL (ref 0.40–1.20)
GFR: 66.85 mL/min (ref >60.00)
Glucose, Bld: 141 mg/dL — ABNORMAL HIGH (ref 70–99)
Potassium: 4 meq/L (ref 3.5–5.1)
Sodium: 142 meq/L (ref 135–145)
Total Bilirubin: 0.3 mg/dL (ref 0.2–1.2)
Total Protein: 6 g/dL (ref 6.0–8.3)

## 2024-02-19 LAB — MICROALBUMIN / CREATININE URINE RATIO
Creatinine,U: 61.4 mg/dL
Microalb Creat Ratio: 89.7 mg/g — ABNORMAL HIGH (ref 0.0–30.0)
Microalb, Ur: 5.5 mg/dL — ABNORMAL HIGH (ref 0.0–1.9)

## 2024-02-19 LAB — HEMOGLOBIN A1C: Hgb A1c MFr Bld: 8.8 % — ABNORMAL HIGH (ref 4.6–6.5)

## 2024-02-19 NOTE — Progress Notes (Signed)
 No critical labs need to be addressed urgently. We will discuss labs in detail at upcoming office visit.

## 2024-02-20 ENCOUNTER — Ambulatory Visit: Attending: Neurology

## 2024-02-20 DIAGNOSIS — R41841 Cognitive communication deficit: Secondary | ICD-10-CM | POA: Insufficient documentation

## 2024-02-20 NOTE — Therapy (Signed)
 OUTPATIENT SPEECH LANGUAGE PATHOLOGY  EVALUATION   Patient Name: Morgan [name] MRN: [id number] DOB:1941/01/18, 83 y.o., female Today's Date: 02/20/2024  PCP: Kerby Nora, MD  REFERRING PROVIDER: Cristopher Peru, MD   End of Session - 02/20/24 1152     Visit Number 1    Number of Visits 24    Date for SLP Re-Evaluation 05/14/24    SLP Start Time 0845    SLP Stop Time  0920    SLP Time Calculation (min) 35 min    Activity Tolerance Patient tolerated treatment well              Patient Active Problem List   Diagnosis Date Noted   Diarrhea of infectious origin 01/18/2024   Weakness 01/18/2024   Enteritis due to Norovirus 01/18/2024   Elevated lipase 01/18/2024   Acute non-recurrent pansinusitis 01/08/2024   Impacted cerumen of right ear 12/14/2023   Acute post-traumatic headache, not intractable 08/14/2023   Bursitis of left hip 07/12/2023   GAD (generalized anxiety disorder) 02/21/2023   Closed nondisplaced fracture of sixth cervical vertebra with routine healing 08/08/2022   Acute renal insufficiency 08/08/2022   Diabetic mononeuropathy associated with type 2 diabetes mellitus (HCC) 05/19/2022   Accidental fall 03/10/2022   Hypomania (HCC) 02/10/2022   Weight loss 02/10/2022   Insomnia 08/16/2021   Iron deficiency anemia 08/16/2021   OAB (overactive bladder) 08/16/2021   Fire ant bite 08/19/2020   Hiatal hernia with GERD 01/21/2020   S/P laparoscopic fundoplication 01/21/2020   Globus sensation    Family history of breast cancer    History of colon polyps    Chronic diarrhea 01/14/2019   Malignant melanoma (HCC) 11/23/2016   Urge incontinence 11/23/2016   Osteopenia 09/15/2014   History of TIA (transient ischemic attack) 05/19/2014   Obesity (BMI 30-39.9) 10/08/2013   OA (osteoarthritis) of knee 07/07/2013   Chronic low back pain 05/09/2013   Obstructive sleep apnea 05/09/2013   GERD (gastroesophageal reflux disease) 05/09/2013   Hiatal hernia 05/09/2013    Fibromyalgia syndrome 05/09/2013   MDD (major depressive disorder), recurrent episode, moderate (HCC) 05/09/2013   RLS (restless legs syndrome) 05/09/2013   Type 2 diabetes mellitus with vascular disease (HCC) 05/09/2013   Hyperlipidemia associated with type 2 diabetes mellitus (HCC) 05/09/2013   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  Pt accompanied by: self  PERTINENT HISTORY: Per Waldo Laine Paich's note, 02/06/24, "Dear Ms. [facility], It was our pleasure to participate in your care. We have typed up brief summary of what we discussed.  1. Unsteady gait - high falls risk  - no signs and symptoms suggestive of injury since 12/03/2023. Strict Emergency Room precautions   2. 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 Headaches - tension type versus hypertensive versus migraine with aura, improving  MRI Brain without contrast + NeuroQuant - 1. No reversible cause for symptoms. Mild chronic small vessel ischemia in the cerebral white matter. 2. Brain atrophy with NeuroQuant volumetric analysis of the brain,  see details  on [employer].   Labs - ESR, CRP, CMP 10/17/2023 - satisfactory   4. Leg Length Discrepancy  X-ray hip with pelvis 10/09/2023 - unremarkable   5. Cognitive Impairment  Reports concerns about memory and forgetfulness. At 83 years old, worried about cognitive decline. No history of head injury or neurological  symptoms. Agreed to perform bedside memory test and brain scan with NeuroQuant without contrast to assess brain volume. Discussed the importance of mentally stimulating activities, good sunlight exposure, and dietary changes to improve cognition. - MRI Brain reviewed as above   6. Unintentional Weight Loss, 15 lbs in 3 months - stablized  Lost 15 pounds unintentionally. Denies trouble swallowing, hematuria, melena, chest pain, shortness of breath, lightheadedness, dizziness, neck pain, and lower back pain. Reports daytime sweats for the past six months. Recent lab work and x-ray of pelvis and lower back were satisfactory. Discussed potential causes including thyroid dysfunction and anemia. Agreed to check thyroid function, CBC, iron levels, and urine analysis. Referred to gastroenterology and primary care for further evaluation. Encouraged to use meal supplement shakes and monitor weight at home. - Labs reviewed: CBC, CMP, Iron, Ferritin, TSH, Urinalysis, Culture - Labs are okay with minor variations.   - follow-up with primary care provider  - Encourage meal supplement shakes and monitor weight at home Assessment & Plan 7. Recent Fall, without head strike, no new symptoms, no signs and symptoms suggestive of injury  Experienced a fall last night after turning too quickly and losing balance. Reports muscle tightness and soreness but denies chest pain, shortness of breath, head injury, loss of consciousness, or bruising. No new symptoms such as back pain, changes in bowel or bladder control, headaches, nausea, or vomiting. Physical exam revealed no bruising or tenderness in the ribs or lower back. Slight unbalance noted during walking test, but no lightheadedness or dizziness. x-rays of thoracic spine 12/11/2023 - Thoracic compression fracture and lumbar spine - Multilevel degenerative changes. No acute findings   8. Thoracic Compression Fracture  -Per orthopedics. MRIs are pending  9. General Health  Maintenance Encouraged to maintain a healthy diet, sleep well, and engage in mentally stimulating activities. Advised to see ENT or primary care for ear cleaning to address blocked ear and potential equilibrium issues. - Recommend seeing ENT or primary care for ear cleaning to address blocked ear and potential equilibrium issue"  DIAGNOSTIC FINDINGS: MRI brain, 12/14/23, "FINDINGS: Brain: No acute infarction, hemorrhage, hydrocephalus, extra-axial collection or mass lesion. Chronic small vessel ischemic gliosis in the cerebral white matter is mild for age. No chronic blood products or abnormal mineralization.   Vascular: Normal flow voids.   Skull and upper cervical spine: Normal marrow signal.   Sinuses/Orbits: Negative   NeuroQuant Findings:   Volumetric analysis of the brain was performed, with a fully detailed report in [employer]. Briefly, the comparison with age and gender matched reference reveals reasonable tagging cortex. Whole brain volume is low normal. The bilateral hippocampus is less than expected.   IMPRESSION: 1. No reversible cause for symptoms. Mild chronic small vessel ischemia in the cerebral white matter. 2. Brain atrophy with NeuroQuant volumetric analysis of the brain, see details on [facility]."  PAIN:  Are you having pain? No   FALLS: Has patient fallen in last 6 months?  Yes  LIVING ENVIRONMENT: Lives with: lives with their spouse and in independent living apartment at [facility]   PLOF:  Level of assistance: Independent with ADLs Employment: Retired   PATIENT GOALS  for memory and communication to improve  OBJECTIVE:   COGNITIVE COMMUNICATION: Overall cognitive status: Impaired Areas of impairment:  Attention: Impaired: Divided Memory: Impaired: Immediate Working; Suspect short term   AUDITORY COMPREHENSION: WFL   READING COMPREHENSION: Intact  EXPRESSION: verbal  VERBAL EXPRESSION: Level of generative/spontaneous  verbalization: sentence and conversation Automatic speech: name: intact and social response: intact  Repetition: Appears intact Naming: Divergent: impaired Pragmatics: Appears intact Comments: occasional anomia appreciated conversationally with inconsistent repair  WRITTEN EXPRESSION: Dominant hand: right   Written expression:  WFL for clockdrawing  MOTOR SPEECH: Overall motor speech: Appears intact  ORAL MOTOR EXAMINATION: WFL  STANDARDIZED ASSESSMENTS: Addenbrooke's Cognitive Examination - ACE III The Addenbrooke's Cognitive Examination-III (ACE-III) is a brief cognitive test that assesses five cognitive domains. The total score is 100 with higher scores indicating better cognitive functioning. Cut off scores of 88 and 82 are recommended for suspicion of dementia (88 has sensitivity of 1.00 and specificity of 0.96, 82 has sensitivity of 0.93 and specificity of 1.00). American Version A  Attention 15/18  Memory 20/26  Fluency 9/14  Language 26/26  Visuospatial 16/16  TOTAL ACE- III Score 86/100     PATIENT REPORTED OUTCOME MEASURES (PROM):  MULTIFACTORIAL MEMORY QUESTIONNAIRE (MMQ)  Administered patient self-reported outcome measure Multifactorial Memory Questionnaire (MMQ). The Multifactorial Memory Questionnaire [facility]) consists of three scales measuring separate aspects of metamemory; Satisfaction, Ability and Strategy.   Pt's responses are converted to T-Scores with severity levels based on pt's T-Score.   Severity Levels (T-score) Very Low - < 20 Low - 20 to 29 Below Average - 30-39 Average - 40 to 60 Above Average - 60 to 70 High - 71 to 80 Very High - > 80  Pt reports:  Low - 20 to 29 Memory Satisfaction (T-score: 37) Below Average - 30-39 Memory Ability (T-score: 39) Average - 40 to 60 use of Memory Strategies (T-score: 53)      TODAY'S TREATMENT:  Pt educated re: role of SLP, rationale for assessment, results of assessment, domains of cognition, normal  vs abnormal changes to memory, and SLP POC.   PATIENT EDUCATION: Education details: as above Person educated: Patient Education method: Explanation Education comprehension: verbalized understanding  HOME EXERCISE PROGRAM:        To be provided in upcoming sessions as appropriate    GOALS:  Goals reviewed with patient? Yes  SHORT TERM GOALS: Target date: 10 sessions  With Min A, patient will complete a semantic feature analysis with at least 3 relevant features for 8/10 target words to improve word-finding skills.   Baseline: Goal status: INITIAL   2.  Pt will ID 2-3 strategies for improved attention/memory with min A. Baseline:  Goal status: INITIAL  3.  Pt will report implementation of 2-3 strategies for improved attention and memory.  Baseline:  Goal status: INITIAL  LONG TERM GOALS: Target date: 12 weeks  Pt will utilize compensatory strategies for anomia with modified independence during informal conversational exchanges. Baseline:  Goal status: INITIAL  2.  Pt will demonstrate knowledge of appropriate of appropriate activities to promote cognitive-linguistic functioning outside of therapy. Baseline:  Goal status: INITIAL  3.  Pt will report improved memory per PROM. Baseline: Low - 20 to 29 Memory Satisfaction (T-score: 37) Below Average - 30-39 Memory Ability (T-score: 39) Average - 40 to 60 use of Memory Strategies (T-score: 53) Goal status: INITIAL   ASSESSMENT:  CLINICAL IMPRESSION:  Patient is a 83 y.o. female who was seen today for cognitive-linguistic  evaluation. Pt with reports of worsening short term memory and wordfinding which has been highlighted during recent move to ILF. Pt expresses frustration with CLOF and is highly motivated to improve cognitive-linguistic functioning. Today's assessment was completed via informal means, formal cognitive assessment - Addenbrooke's Cognitive Evaluation (ACE-III), and PROM. Pt presents with mild  cognitive-linguistic deficits affecting attention, memory, and verbal fluency. Recommend course of ST targeting education and compensations to improve cognitive-linguistic functioning and overall QoL.   OBJECTIVE IMPAIRMENTS include attention, memory, and expressive language. These impairments are limiting patient from effectively communicating at home and in community. Factors affecting potential to achieve goals and functional outcome are ability to learn/carryover information.. Patient will benefit from skilled SLP services to address above impairments and improve overall function.  REHAB POTENTIAL: Good  PLAN: SLP FREQUENCY: 1-2x/week  SLP DURATION: 12 weeks  PLANNED INTERVENTIONS: Cueing hierachy, Cognitive reorganization, Internal/external aids, Functional tasks, Multimodal communication approach, and SLP instruction and feedback   Clyde Canterbury, M.S., CCC-SLP Speech-Language Pathologist [facility] [phone] Arnette Felts)   [facility] at [facility] [address] [place], [place], [address] Phone: [phone]   Fax:  [phone]

## 2024-02-27 ENCOUNTER — Ambulatory Visit

## 2024-02-27 DIAGNOSIS — R41841 Cognitive communication deficit: Secondary | ICD-10-CM

## 2024-02-27 NOTE — Therapy (Signed)
 OUTPATIENT SPEECH LANGUAGE PATHOLOGY  TREATMENT   Patient Name: Morgan [name] MRN: [id number] DOB:1941/02/23, 83 y.o., female Today's Date: 02/27/2024  PCP: Herby Lolling, MD  REFERRING PROVIDER: Devora Folks, MD   End of Session - 02/27/24 0842     Visit Number 2    Number of Visits 24    Date for SLP Re-Evaluation 05/14/24    SLP Start Time 0845    SLP Stop Time  0930    SLP Time Calculation (min) 45 min    Activity Tolerance Patient tolerated treatment well              Patient Active Problem List   Diagnosis Date Noted   Diarrhea of infectious origin 01/18/2024   Weakness 01/18/2024   Enteritis due to Norovirus 01/18/2024   Elevated lipase 01/18/2024   Acute non-recurrent pansinusitis 01/08/2024   Impacted cerumen of right ear 12/14/2023   Acute post-traumatic headache, not intractable 08/14/2023   Bursitis of left hip 07/12/2023   GAD (generalized anxiety disorder) 02/21/2023   Closed nondisplaced fracture of sixth cervical vertebra with routine healing 08/08/2022   Acute renal insufficiency 08/08/2022   Diabetic mononeuropathy associated with type 2 diabetes mellitus (HCC) 05/19/2022   Accidental fall 03/10/2022   Hypomania (HCC) 02/10/2022   Weight loss 02/10/2022   Insomnia 08/16/2021   Iron deficiency anemia 08/16/2021   OAB (overactive bladder) 08/16/2021   Fire ant bite 08/19/2020   Hiatal hernia with GERD 01/21/2020   S/P laparoscopic fundoplication 01/21/2020   Globus sensation    Family history of breast cancer    History of colon polyps    Chronic diarrhea 01/14/2019   Malignant melanoma (HCC) 11/23/2016   Urge incontinence 11/23/2016   Osteopenia 09/15/2014   History of TIA (transient ischemic attack) 05/19/2014   Obesity (BMI 30-39.9) 10/08/2013   OA (osteoarthritis) of knee 07/07/2013   Chronic low back pain 05/09/2013   Obstructive sleep apnea 05/09/2013   GERD (gastroesophageal reflux disease) 05/09/2013   Hiatal hernia 05/09/2013    Fibromyalgia syndrome 05/09/2013   MDD (major depressive disorder), recurrent episode, moderate (HCC) 05/09/2013   RLS (restless legs syndrome) 05/09/2013   Type 2 diabetes mellitus with vascular disease (HCC) 05/09/2013   Hyperlipidemia associated with type 2 diabetes mellitus (HCC) 05/09/2013   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  Pt accompanied by: self  PERTINENT HISTORY: Per Kaitlin Paich's note, 02/06/24, "Dear Ms. [facility], It was our pleasure to participate in your care. We have typed up brief summary of what we discussed.  1. Unsteady gait - high falls risk  - no signs and symptoms suggestive of injury since 12/03/2023. Strict Emergency Room precautions   2. 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 Headaches - tension type versus hypertensive versus migraine with aura, improving  MRI Brain without contrast + NeuroQuant - 1. No reversible cause for symptoms. Mild chronic small vessel ischemia in the cerebral white matter. 2. Brain atrophy with NeuroQuant volumetric analysis of the brain,  see details  on [employer].   Labs - ESR, CRP, CMP 10/17/2023 - satisfactory   4. Leg Length Discrepancy  X-ray hip with pelvis 10/09/2023 - unremarkable   5. Cognitive Impairment  Reports concerns about memory and forgetfulness. At 83 years old, worried about cognitive decline. No history of head injury or neurological  symptoms. Agreed to perform bedside memory test and brain scan with NeuroQuant without contrast to assess brain volume. Discussed the importance of mentally stimulating activities, good sunlight exposure, and dietary changes to improve cognition. - MRI Brain reviewed as above   6. Unintentional Weight Loss, 15 lbs in 3 months - stablized  Lost 15 pounds unintentionally. Denies trouble swallowing, hematuria, melena, chest pain, shortness of breath, lightheadedness, dizziness, neck pain, and lower back pain. Reports daytime sweats for the past six months. Recent lab work and x-ray of pelvis and lower back were satisfactory. Discussed potential causes including thyroid dysfunction and anemia. Agreed to check thyroid function, CBC, iron levels, and urine analysis. Referred to gastroenterology and primary care for further evaluation. Encouraged to use meal supplement shakes and monitor weight at home. - Labs reviewed: CBC, CMP, Iron, Ferritin, TSH, Urinalysis, Culture - Labs are okay with minor variations.   - follow-up with primary care provider  - Encourage meal supplement shakes and monitor weight at home Assessment & Plan 7. Recent Fall, without head strike, no new symptoms, no signs and symptoms suggestive of injury  Experienced a fall last night after turning too quickly and losing balance. Reports muscle tightness and soreness but denies chest pain, shortness of breath, head injury, loss of consciousness, or bruising. No new symptoms such as back pain, changes in bowel or bladder control, headaches, nausea, or vomiting. Physical exam revealed no bruising or tenderness in the ribs or lower back. Slight unbalance noted during walking test, but no lightheadedness or dizziness. x-rays of thoracic spine 12/11/2023 - Thoracic compression fracture and lumbar spine - Multilevel degenerative changes. No acute findings   8. Thoracic Compression Fracture  -Per orthopedics. MRIs are pending  9. General Health  Maintenance Encouraged to maintain a healthy diet, sleep well, and engage in mentally stimulating activities. Advised to see ENT or primary care for ear cleaning to address blocked ear and potential equilibrium issues. - Recommend seeing ENT or primary care for ear cleaning to address blocked ear and potential equilibrium issue"  DIAGNOSTIC FINDINGS: MRI brain, 12/14/23, "FINDINGS: Brain: No acute infarction, hemorrhage, hydrocephalus, extra-axial collection or mass lesion. Chronic small vessel ischemic gliosis in the cerebral white matter is mild for age. No chronic blood products or abnormal mineralization.   Vascular: Normal flow voids.   Skull and upper cervical spine: Normal marrow signal.   Sinuses/Orbits: Negative   NeuroQuant Findings:   Volumetric analysis of the brain was performed, with a fully detailed report in [employer]. Briefly, the comparison with age and gender matched reference reveals reasonable tagging cortex. Whole brain volume is low normal. The bilateral hippocampus is less than expected.   IMPRESSION: 1. No reversible cause for symptoms. Mild chronic small vessel ischemia in the cerebral white matter. 2. Brain atrophy with NeuroQuant volumetric analysis of the brain, see details on [facility]."  PAIN:  Are you having pain? No   FALLS: Has patient fallen in last 6 months?  Yes  LIVING ENVIRONMENT: Lives with: lives with their spouse and in independent living apartment at [facility]   PLOF:  Level of assistance: Independent with ADLs Employment: Retired   PATIENT GOALS  for memory and communication to improve  OBJECTIVE:   TODAY'S TREATMENT:  Reviewed results of MMQ (administered previous session) with pt. Discussed POC targeting wordfinding and functional memory. Pt continues to ID recalling people's names as a area of concern for her. Cursory introduction to name recall strategies initiated. Will continue to focus on in upcoming sessions.  Introduced Engineer, agricultural as an anomia strategy. Pt required mod cueing initially to employ during structured naming task, reducing to rare-min cues. Will continue to address in upcoming session.    PATIENT EDUCATION: Education details: as above Person educated: Patient Education method: Explanation Education comprehension: verbalized understanding  HOME EXERCISE PROGRAM:        Practice SFA    GOALS:  Goals reviewed with patient? Yes  SHORT TERM GOALS: Target date: 10 sessions  With Min A, patient will complete a semantic feature analysis with at least 3 relevant features for 8/10 target words to improve word-finding skills.   Baseline: Goal status: INITIAL   2.  Pt will ID 2-3 strategies for improved attention/memory with min A. Baseline:  Goal status: INITIAL  3.  Pt will report implementation of 2-3 strategies for improved attention and memory.  Baseline:  Goal status: INITIAL  LONG TERM GOALS: Target date: 12 weeks  Pt will utilize compensatory strategies for anomia with modified independence during informal conversational exchanges. Baseline:  Goal status: INITIAL  2.  Pt will demonstrate knowledge of appropriate of appropriate activities to promote cognitive-linguistic functioning outside of therapy. Baseline:  Goal status: INITIAL  3.  Pt will report improved memory per PROM. Baseline: Low - 20 to 29 Memory Satisfaction (T-score: 37) Below Average - 30-39 Memory Ability (T-score: 39) Average - 40 to 60 use of Memory Strategies (T-score: 53) Goal status: INITIAL   ASSESSMENT:  CLINICAL IMPRESSION:  Patient is a 83 y.o. female who was seen today for cognitive-linguistic tx. Pt with reports of worsening short term memory and wordfinding which has been highlighted during recent move to ILF. Pt expresses frustration with CLOF and is highly motivated to improve cognitive-linguistic functioning. Initial assessment was completed via informal means,  formal cognitive assessment - Addenbrooke's Cognitive Evaluation (ACE-III), and PROM. Pt presents with mild cognitive-linguistic deficits affecting attention, memory, and verbal fluency. See details of tx session above. Recommend course of ST targeting education and compensations to improve cognitive-linguistic functioning and overall QoL.   OBJECTIVE IMPAIRMENTS include attention, memory, and expressive language. These impairments are limiting patient from effectively communicating at home and in community. Factors affecting potential to achieve goals and functional outcome are ability to learn/carryover information.. Patient will benefit from skilled SLP services to address above impairments and improve overall function.  REHAB POTENTIAL: Good  PLAN: SLP FREQUENCY: 1-2x/week  SLP DURATION: 12 weeks  PLANNED INTERVENTIONS: Cueing hierachy, Cognitive reorganization, Internal/external aids, Functional tasks, Multimodal communication approach, and SLP instruction and feedback   Dia Forget, M.S., CCC-SLP Speech-Language Pathologist [facility] [phone] [place]man)   [facility] Outpatient Rehabilitation at [facility] [address] [place], [place], [address] Phone: [phone]   Fax:  [phone]

## 2024-02-28 ENCOUNTER — Ambulatory Visit (INDEPENDENT_AMBULATORY_CARE_PROVIDER_SITE_OTHER): Admitting: Family Medicine

## 2024-02-28 ENCOUNTER — Encounter: Payer: Self-pay | Admitting: Family Medicine

## 2024-02-28 VITALS — BP 160/78 | HR 59 | Temp 97.8°F | Ht 61.5 in | Wt 128.2 lb

## 2024-02-28 DIAGNOSIS — G4700 Insomnia, unspecified: Secondary | ICD-10-CM | POA: Diagnosis not present

## 2024-02-28 DIAGNOSIS — F331 Major depressive disorder, recurrent, moderate: Secondary | ICD-10-CM | POA: Diagnosis not present

## 2024-02-28 DIAGNOSIS — E785 Hyperlipidemia, unspecified: Secondary | ICD-10-CM

## 2024-02-28 DIAGNOSIS — E1141 Type 2 diabetes mellitus with diabetic mononeuropathy: Secondary | ICD-10-CM

## 2024-02-28 DIAGNOSIS — Z7984 Long term (current) use of oral hypoglycemic drugs: Secondary | ICD-10-CM

## 2024-02-28 DIAGNOSIS — E1159 Type 2 diabetes mellitus with other circulatory complications: Secondary | ICD-10-CM | POA: Diagnosis not present

## 2024-02-28 DIAGNOSIS — I480 Paroxysmal atrial fibrillation: Secondary | ICD-10-CM

## 2024-02-28 DIAGNOSIS — Z0000 Encounter for general adult medical examination without abnormal findings: Secondary | ICD-10-CM

## 2024-02-28 DIAGNOSIS — I152 Hypertension secondary to endocrine disorders: Secondary | ICD-10-CM

## 2024-02-28 DIAGNOSIS — E1169 Type 2 diabetes mellitus with other specified complication: Secondary | ICD-10-CM | POA: Diagnosis not present

## 2024-02-28 DIAGNOSIS — E1129 Type 2 diabetes mellitus with other diabetic kidney complication: Secondary | ICD-10-CM | POA: Diagnosis not present

## 2024-02-28 DIAGNOSIS — R809 Proteinuria, unspecified: Secondary | ICD-10-CM

## 2024-02-28 LAB — HM DIABETES FOOT EXAM

## 2024-02-28 MED ORDER — LOSARTAN POTASSIUM 50 MG PO TABS: 50.0000 mg | ORAL_TABLET | Freq: Every day | ORAL | 3 refills | Status: DC

## 2024-02-28 MED ORDER — MIRABEGRON ER 50 MG PO TB24: 50.0000 mg | ORAL_TABLET | Freq: Every day | ORAL | 11 refills | Status: AC

## 2024-02-28 MED ORDER — EMPAGLIFLOZIN 10 MG PO TABS: 10.0000 mg | ORAL_TABLET | Freq: Every day | ORAL | 11 refills | Status: AC

## 2024-02-28 NOTE — Assessment & Plan Note (Signed)
 Followed by Cardiology

## 2024-02-28 NOTE — Assessment & Plan Note (Signed)
 Chronic insomnia most likely related to urinary frequency that occurs through the night.  She states that Myrbetriq helps more than Gemtesa new prescription was sent in for her to continue this. If control of her blood sugar does not improve her urinary frequency and therefore her sleep issue at night at her follow-up appointment we can consider further treatment options such as different sleep medication.  Of note when she was feeling more hypomanic Seroquel was helpful in addition to her venlafaxine.

## 2024-02-28 NOTE — Patient Instructions (Addendum)
 Start losartan 50 mg daily for BP control and kidney protection.  Start  jardiance 10 mg daily for diabetes control and kidney protection.  Work on low [employer].Aaron Aas avoid jelly beans.  Continue mirbegron instead of  gemtessa for urine symptoms.. follow up with urology if  worsening.

## 2024-02-28 NOTE — Assessment & Plan Note (Signed)
 Chronic, associated with DM.  Treated with gabapentin 600 mg daily.

## 2024-02-28 NOTE — Assessment & Plan Note (Addendum)
 Chronic, poorly controlled in the setting of diabetes with new microalbuminuria.  Will have her start losartan 50 mg p.o. daily She will follow-up in 2 weeks for repeat blood pressure evaluation and check of renal function.

## 2024-02-28 NOTE — Progress Notes (Signed)
 25 mg daily   Patient ID: Morgan [name], female    DOB: 17-Dec-1940, 83 y.o.   MRN: [id number]  This visit was conducted in person.  BP (!) 160/78   Pulse (!) 59   Temp 97.8 F (36.6 C) (Oral)   Ht 5' 1.5" (1.562 m)   Wt 128 lb 3.2 oz (58.2 kg)   SpO2 98%   BMI 23.83 kg/m    CC:  Chief Complaint  Patient presents with   Annual Exam    Patient also states that she is not sleeping well    Subjective:   HPI: Morgan [name] is a 83 y.o. female presenting on 02/28/2024 for Annual Exam (Patient also states that she is not sleeping well)  The patient presents for  complete physical and review of chronic health problems. He/She also has the following acute concerns today: None  The patient will see  a LPN or RN for medicare wellness visit. 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 MRA head and neck negative. EEG did not show any epileptiform discharges.   Cardiac workup, including echocardiogram and Holter monitor, has been completed.   Followed by Dr. Renna [facility].  2025 OV note reviewed.  Paroxsysmal Afib, hx of CAD   Referred to PT at [facility]  for gait instability.  Flowsheet Row Office Visit from 02/28/2024 in [facility]  PHQ-2 Total Score 0      Hypertension:     BP above goal  despite  chlorthalidone, metoprolol. BP Readings from Last 3 Encounters:  02/28/24 (!) 160/78  01/18/24 (!) 150/80  01/15/24 (!) 148/61  Using medication without problems or lightheadedness:  none Chest pain with exertion: none Edema: none Short of breath:  occ Average home BPs: Other issues:  Diabetes:     Poor control on metformin  1000 mg BID, off victoza ( caused GI issues)   Lab Results  Component Value Date   HGBA1C 8.8 (H) 02/19/2024   Using medications without difficulties: Hypoglycemic episodes: Hyperglycemic episodes: Feet problems: none Blood Sugars averaging: eye exam within last year:  occ. Due for foot exam  Microalbumin positive.. NOT on ACE/ARB anymore  or SGLT2i  Elevated Cholesterol:  LDL at goal on atorvastatin 80 mg daily Lab Results  Component Value Date   CHOL 110 02/19/2024   HDL 42.90 02/19/2024   LDLCALC 44 02/19/2024   TRIG 114.0 02/19/2024   CHOLHDL 3 02/19/2024  Using medications without problems: Muscle aches:  Diet compliance: healthy Exercise: minimal. Other complaints:   Wt Readings from Last 3 Encounters:  02/28/24 128 lb 3.2 oz (58.2 kg)  01/18/24 123 lb 2 oz (55.8 kg)  01/15/24 128 lb (58.1 kg)    Iron deficiency anemia resolve on iron.  Malignant melanoma: followed by Derm every 6 months.   Urge incontinence:   Myrbetriq works better than gemtessa.   MDD, GAD: On venlafaxine 75 mg daily.   Increased anxiety, trouble sleeping... no longer taking seroquel at night.... has frequent urination but also mind racing at night    Restless leg syndrome: gabapentin 600 mg at night. Using half tablet ropinerole during the day and 2 tablets at bedtime. This is well controlled at night  Relevant past medical, surgical, family and social history  reviewed and updated as indicated. Interim medical history since our last visit reviewed. Allergies and medications reviewed and updated. Outpatient Medications Prior to Visit  Medication Sig Dispense Refill   albuterol (VENTOLIN HFA) 108 (90 Base) MCG/ACT inhaler Inhale 2 puffs into the lungs as needed for wheezing.     Alum & Mag Hydroxide-Simeth (MAALOX REGULAR STRENGTH PO) Take by mouth daily as needed. Patient takes 1 capful daily of the powder formulation.     aspirin EC 81 MG tablet Take 1 tablet (81 mg total) by mouth daily.     atorvastatin (LIPITOR) 80 MG tablet TAKE 1 TABLET BY MOUTH EVERY DAY 90 tablet 0   BLINK TEARS 0.25 % SOLN  Place 1 drop into both eyes daily as needed (dry/irritated eyes.).     calcium-vitamin D (OSCAL WITH D) 500-200 MG-UNIT tablet Take 1 tablet by mouth 2 (two) times daily.     chlorthalidone (HYGROTON) 25 MG tablet TAKE 1 TABLET BY MOUTH EVERY DAY 90 tablet 3   CINNAMON PO Take 1,000 mg by mouth in the morning and at bedtime.     ferrous sulfate 325 (65 FE) MG tablet Take 325 mg by mouth daily.     gabapentin (NEURONTIN) 300 MG capsule TAKE ONE CAPSULE IN THE MORNING, ONE CAPSULE IN THE EVENING AND 3 CAPSULES AT BEDTIME. 150 capsule 0   Lancet Device MISC Use to check blood sugar twice daily. 1 each 0   Lancets (FREESTYLE) lancets Use to check blood sugar twice daily 100 each 11   metFORMIN (GLUCOPHAGE-XR) 500 MG 24 hr tablet TAKE 2 TABLETS BY MOUTH TWICE A DAY 360 tablet 3   methylcellulose (CITRUCEL) oral powder Use as directed daily     metoprolol succinate (TOPROL-XL) 100 MG 24 hr tablet TAKE 1 TABLET BY MOUTH EVERY DAY WITH OR IMMEDIATELY FOLLOWING A MEAL 90 tablet 0   Multiple Vitamins-Minerals (PRESERVISION AREDS PO) Take 1 tablet by mouth 2 (two) times daily. Reported on 12/23/2015     nitroGLYCERIN (NITROSTAT) 0.4 MG SL tablet PLACE 1 TABLET (0.4 MG TOTAL) UNDER THE TONGUE EVERY 5 (FIVE) MINUTES AS NEEDED FOR CHEST PAIN 25 tablet 3   ONETOUCH ULTRA test strip USE TO CHECK BLOOD SUGAR TWICE DAILY 150 strip 3   Probiotic Product (ALIGN PO) Take by mouth at bedtime.     QUEtiapine (SEROQUEL) 25 MG tablet TAKE 1 TABLET BY MOUTH EVERYDAY AT BEDTIME 90 tablet 1   rOPINIRole (REQUIP) 1 MG tablet 1/2 TABLET DURING THE DAY, 2 TABLETS AT BEDTIME 270 tablet 3   venlafaxine XR (EFFEXOR-XR) 75 MG 24 hr capsule TAKE 1 CAPSULE BY MOUTH DAILY WITH BREAKFAST. 90 capsule 0   vitamin B-12 (CYANOCOBALAMIN) 1000 MCG tablet Take 1,000 mcg by mouth daily.     Vibegron (GEMTESA) 75 MG TABS Take 1 tablet (75 mg total) by mouth daily. 30 tablet 11   No facility-administered medications prior to visit.     Per  HPI unless specifically indicated in ROS section below Review of Systems  Constitutional:  Negative for fatigue and fever.  HENT:  Negative for congestion.   Eyes:  Negative for pain.  Respiratory:  Negative for cough and shortness of breath.   Cardiovascular:  Negative for chest pain, palpitations and leg swelling.  Gastrointestinal:  Negative for abdominal pain.  Genitourinary:  Negative for dysuria and vaginal bleeding.  Musculoskeletal:  Negative for back pain.  Neurological:  Negative for syncope, light-headedness and headaches.  Psychiatric/Behavioral:  Positive for agitation and sleep  disturbance. Negative for confusion, decreased concentration, dysphoric mood and suicidal ideas. The patient is nervous/anxious and is hyperactive.    Objective:  BP (!) 160/78   Pulse (!) 59   Temp 97.8 F (36.6 C) (Oral)   Ht 5' 1.5" (1.562 m)   Wt 128 lb 3.2 oz (58.2 kg)   SpO2 98%   BMI 23.83 kg/m   Wt Readings from Last 3 Encounters:  02/28/24 128 lb 3.2 oz (58.2 kg)  01/18/24 123 lb 2 oz (55.8 kg)  01/15/24 128 lb (58.1 kg)      Physical Exam Vitals and nursing note reviewed.  Constitutional:      General: She is not in acute distress.    Appearance: Normal appearance. She is well-developed. She is not ill-appearing or toxic-appearing.  HENT:     Head: Normocephalic.     Right Ear: Hearing, tympanic membrane, ear canal and external ear normal.     Left Ear: Hearing, tympanic membrane, ear canal and external ear normal.     Nose: Nose normal.  Eyes:     General: Lids are normal. Lids are everted, no foreign bodies appreciated.     Conjunctiva/sclera: Conjunctivae normal.     Pupils: Pupils are equal, round, and reactive to light.  Neck:     Thyroid: No thyroid mass or thyromegaly.     Vascular: No carotid bruit.     Trachea: Trachea normal.  Cardiovascular:     Rate and Rhythm: Normal rate and regular rhythm.     Heart sounds: Normal heart sounds, S1 normal and S2 normal. No  murmur heard.    No gallop.  Pulmonary:     Effort: Pulmonary effort is normal. No respiratory distress.     Breath sounds: Normal breath sounds. No wheezing, rhonchi or rales.  Abdominal:     General: Bowel sounds are normal. There is no distension or abdominal bruit.     Palpations: Abdomen is soft. There is no fluid wave or mass.     Tenderness: There is no abdominal tenderness. There is no guarding or rebound.     Hernia: No hernia is present.  Musculoskeletal:     Cervical back: Normal range of motion and neck supple.  Lymphadenopathy:     Cervical: No cervical adenopathy.  Skin:    General: Skin is warm and dry.     Findings: No rash.  Neurological:     Mental Status: She is alert.     Cranial Nerves: No cranial nerve deficit.     Sensory: No sensory deficit.  Psychiatric:        Mood and Affect: Mood is not anxious or depressed.        Speech: Speech normal.        Behavior: Behavior normal. Behavior is cooperative.        Judgment: Judgment normal.       Diabetic foot exam: Normal inspection No skin breakdown No calluses  Normal DP pulses Decreased sensation to light touch and monofilament Nails normal  Results for orders placed or performed in visit on 02/28/24  HM DIABETES FOOT EXAM   Collection Time: 02/28/24 12:00 AM  Result Value Ref Range   [facility] Diabetic Foot Exam done    *Note: Due to a large number of results and/or encounters for the requested time period, some results have not been displayed. A complete set of results can be found in Results Review.    This visit occurred during the SARS-CoV-2 public health  emergency.  Safety protocols were in place, including screening questions prior to the visit, additional usage of staff PPE, and extensive cleaning of exam room while observing appropriate contact time as indicated for disinfecting solutions.   COVID 19 screen:  No recent travel or known exposure to COVID19 The patient denies respiratory symptoms of  COVID 19 at this time. The importance of social distancing was discussed today.   Assessment and Plan The patient's preventative maintenance and recommended screening tests for an annual wellness exam were reviewed in full today. Brought up to date unless services declined.  Counselled on the importance of diet, exercise, and its role in overall health and mortality. The patient's FH and SH was reviewed, including their home life, tobacco status, and drug and alcohol status.   Vaccines: uptodate, COVID x 3, plans 4th booster. Pap/DVE:  No pap, DVE not indicated... Asymptomatic. No family history of uterine or ovarian cancer. Mammo: 05/2023, no yearly breast exam. Mother with breast cancer. Bone Density: 06/2019 stable osteopenia, repeat 5 years Colon:08/2020 Sister with strong family history colon cancer,  neg Cologuard 2023 Smoking Status:None Had eye exam in 10/2023  Problem List Items Addressed This Visit     Diabetic mononeuropathy associated with type 2 diabetes mellitus (HCC)   Chronic, associated with DM.  Treated with gabapentin 600 mg daily.      Relevant Medications   losartan (COZAAR) 50 MG tablet   empagliflozin (JARDIANCE) 10 MG TABS tablet   Hyperlipidemia associated with type 2 diabetes mellitus (HCC) (Chronic)   Chronic, stable at last check.  Continue current medication.  Atorvastatin 80 mg daily       Relevant Medications   losartan (COZAAR) 50 MG tablet   empagliflozin (JARDIANCE) 10 MG TABS tablet   Hypertension associated with diabetes (HCC) (Chronic)   Chronic, poorly controlled in the setting of diabetes with new microalbuminuria.  Will have her start losartan 50 mg p.o. daily She will follow-up in 2 weeks for repeat blood pressure evaluation and check of renal function.      Relevant Medications   losartan (COZAAR) 50 MG tablet   empagliflozin (JARDIANCE) 10 MG TABS tablet   Insomnia   Chronic insomnia most likely related to urinary frequency that  occurs through the night.  She states that Myrbetriq helps more than Gemtesa new prescription was sent in for her to continue this. If control of her blood sugar does not improve her urinary frequency and therefore her sleep issue at night at her follow-up appointment we can consider further treatment options such as different sleep medication.  Of note when she was feeling more hypomanic Seroquel was helpful in addition to her venlafaxine.      MDD (major depressive disorder), recurrent episode, moderate (HCC) (Chronic)   Chronic,  MODERATE control  More anxiety with husbands health issues.  Venlafaxine 75 mg daily       Microalbuminuria due to type 2 diabetes mellitus (HCC)   New diagnosis, now patient started on ARB and SGLT2 inhibitor.      Relevant Medications   losartan (COZAAR) 50 MG tablet   empagliflozin (JARDIANCE) 10 MG TABS tablet   Paroxysmal A-fib (HCC) (Chronic)   Followed by Cardiology.       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  Will send in Jardiance 10 mg p.o. daily.        Relevant Medications   losartan (COZAAR) 50 MG tablet   empagliflozin (JARDIANCE) 10 MG TABS tablet   Other Visit Diagnoses       Routine general medical examination at a health care facility    -  Primary         Herby Lolling, MD

## 2024-02-28 NOTE — Assessment & Plan Note (Signed)
Chronic, stable at last check.  Continue current medication.  Atorvastatin 80 mg daily

## 2024-02-28 NOTE — Assessment & Plan Note (Addendum)
 Chronic, poor control  In setting of new microalbuminuria we will have her start SGLT2 medication in addition to current metformin. Will send in Jardiance 10 mg p.o. daily.

## 2024-02-28 NOTE — Assessment & Plan Note (Signed)
 Chronic,  MODERATE control  More anxiety with husbands health issues.  Venlafaxine 75 mg daily

## 2024-02-28 NOTE — Assessment & Plan Note (Signed)
 New diagnosis, now patient started on ARB and SGLT2 inhibitor.

## 2024-03-03 ENCOUNTER — Other Ambulatory Visit: Payer: Self-pay | Admitting: Licensed Clinical Social Worker

## 2024-03-03 ENCOUNTER — Ambulatory Visit

## 2024-03-03 ENCOUNTER — Ambulatory Visit (INDEPENDENT_AMBULATORY_CARE_PROVIDER_SITE_OTHER): Payer: Medicare PPO

## 2024-03-03 ENCOUNTER — Ambulatory Visit: Payer: Self-pay | Admitting: Licensed Clinical Social Worker

## 2024-03-03 ENCOUNTER — Encounter: Payer: Self-pay | Admitting: Licensed Clinical Social Worker

## 2024-03-03 VITALS — BP 160/78 | Ht 61.5 in | Wt 126.0 lb

## 2024-03-03 DIAGNOSIS — Z0000 Encounter for general adult medical examination without abnormal findings: Secondary | ICD-10-CM | POA: Diagnosis not present

## 2024-03-03 DIAGNOSIS — R41841 Cognitive communication deficit: Secondary | ICD-10-CM

## 2024-03-03 DIAGNOSIS — Z2821 Immunization not carried out because of patient refusal: Secondary | ICD-10-CM | POA: Diagnosis not present

## 2024-03-03 NOTE — Progress Notes (Signed)
 Because this visit was a virtual/telehealth visit,  certain criteria was not obtained, such a blood pressure, CBG if applicable, and timed get up and go. Any medications not marked as "taking" were not mentioned during the medication reconciliation part of the visit. Any vitals not documented were not able to be obtained due to this being a telehealth visit or patient was unable to self-report a recent blood pressure reading due to a lack of equipment at home via telehealth. Vitals that have been documented are verbally provided by the patient.    This visit was performed by a medical professional under my direct supervision. I was immediately available for consultation/collaboration. I have reviewed and agree with the Annual Wellness Visit documentation.  Subjective:   Morgan [name] is a 83 y.o. who presents for a Medicare Wellness preventive visit.  Visit Complete: Virtual I connected with  Morgan [name] on 03/03/24 by a audio enabled telemedicine application and verified that I am speaking with the correct person using two identifiers.  Patient Location: Home  Provider Location: Home Office  I discussed the limitations of evaluation and management by telemedicine. The patient expressed understanding and agreed to proceed.  Vital Signs: Because this visit was a virtual/telehealth visit, some criteria may be missing or patient reported. Any vitals not documented were not able to be obtained and vitals that have been documented are patient reported.  VideoDeclined- This patient declined Librarian, academic. Therefore the visit was completed with audio only.  Persons Participating in Visit: Patient.  AWV Questionnaire: No: Patient Medicare AWV questionnaire was not completed prior to this visit.  Cardiac Risk Factors include: advanced age (>20men, >43 women);diabetes mellitus;hypertension;dyslipidemia     Objective:    Today's Vitals   03/03/24 1023  BP:  (!) 160/78  Weight: 126 lb (57.2 kg)  Height: 5' 1.5" (1.562 m)   Body mass index is 23.42 kg/m.     03/03/2024   10:22 AM 01/15/2024   12:29 PM 02/28/2023    9:59 AM 08/05/2022    8:14 AM 07/29/2022   12:53 AM 02/08/2021    9:55 AM 01/21/2020    4:56 PM  Advanced Directives  Does Patient Have a Medical Advance Directive? Yes Yes Yes Yes Yes Yes No  Type of Estate agent of [place];Living will Healthcare Power of [place];Living will Healthcare Power of [place];Living will Healthcare Power of [place];Living will Healthcare Power of [place];Living will;Out of facility DNR (pink MOST or yellow form) Healthcare Power of [place];Living will Healthcare Power of [place];Living will  Does patient want to make changes to medical advance directive? No - Patient declined   No - Patient declined     Copy of Healthcare Power of Attorney in Chart? No - copy requested  No - copy requested No - copy requested, Physician notified  No - copy requested No - copy requested  Would patient like information on creating a medical advance directive?       No - Patient declined    Current Medications (verified) Outpatient Encounter Medications as of 03/03/2024  Medication Sig   albuterol  (VENTOLIN  HFA) 108 (90 Base) MCG/ACT inhaler Inhale 2 puffs into the lungs as needed for wheezing.   Alum & Mag Hydroxide-Simeth (MAALOX REGULAR STRENGTH PO) Take by mouth daily as needed. Patient takes 1 capful daily of the powder formulation.   aspirin  EC 81 MG tablet Take 1 tablet (81 mg total) by mouth daily.   atorvastatin  (LIPITOR ) 80 MG tablet TAKE 1 TABLET  BY MOUTH EVERY DAY   BLINK TEARS 0.25 % SOLN Place 1 drop into both eyes daily as needed (dry/irritated eyes.).   calcium -vitamin D  (OSCAL WITH D) 500-200 MG-UNIT tablet Take 1 tablet by mouth 2 (two) times daily.   chlorthalidone  (HYGROTON ) 25 MG tablet TAKE 1 TABLET BY MOUTH EVERY DAY   CINNAMON PO Take 1,000 mg by mouth in the morning and at  bedtime.   empagliflozin  (JARDIANCE ) 10 MG TABS tablet Take 1 tablet (10 mg total) by mouth daily before breakfast.   ferrous sulfate 325 (65 FE) MG tablet Take 325 mg by mouth daily.   gabapentin  (NEURONTIN ) 300 MG capsule TAKE ONE CAPSULE IN THE MORNING, ONE CAPSULE IN THE EVENING AND 3 CAPSULES AT BEDTIME.   Lancet Device MISC Use to check blood sugar twice daily.   Lancets (FREESTYLE) lancets Use to check blood sugar twice daily   losartan  (COZAAR ) 50 MG tablet Take 1 tablet (50 mg total) by mouth daily.   metFORMIN  (GLUCOPHAGE -XR) 500 MG 24 hr tablet TAKE 2 TABLETS BY MOUTH TWICE A DAY   methylcellulose (CITRUCEL) oral powder Use as directed daily   metoprolol  succinate (TOPROL -XL) 100 MG 24 hr tablet TAKE 1 TABLET BY MOUTH EVERY DAY WITH OR IMMEDIATELY FOLLOWING A MEAL   mirabegron  ER (MYRBETRIQ ) 50 MG TB24 tablet Take 1 tablet (50 mg total) by mouth daily.   Multiple Vitamins-Minerals (PRESERVISION AREDS PO) Take 1 tablet by mouth 2 (two) times daily. Reported on 12/23/2015   nitroGLYCERIN  (NITROSTAT ) 0.4 MG SL tablet PLACE 1 TABLET (0.4 MG TOTAL) UNDER THE TONGUE EVERY 5 (FIVE) MINUTES AS NEEDED FOR CHEST PAIN   ONETOUCH ULTRA test strip USE TO CHECK BLOOD SUGAR TWICE DAILY   Probiotic Product (ALIGN PO) Take by mouth at bedtime.   QUEtiapine  (SEROQUEL ) 25 MG tablet TAKE 1 TABLET BY MOUTH EVERYDAY AT BEDTIME   rOPINIRole  (REQUIP ) 1 MG tablet 1/2 TABLET DURING THE DAY, 2 TABLETS AT BEDTIME   venlafaxine  XR (EFFEXOR -XR) 75 MG 24 hr capsule TAKE 1 CAPSULE BY MOUTH DAILY WITH BREAKFAST.   vitamin B-12 (CYANOCOBALAMIN ) 1000 MCG tablet Take 1,000 mcg by mouth daily.   No facility-administered encounter medications on file as of 03/03/2024.    Allergies (verified) Hydrocodone -acetaminophen , Percocet [oxycodone -acetaminophen ], and Sulfa antibiotics   History: Past Medical History:  Diagnosis Date   Angina    Arthritis    Atrial fibrillation (HCC)    ASPIRIN  FOR BLOOD THINNER   Atypical  mole 09/22/2016   Bulging discs    lumbar    Complication of anesthesia    pt states has choking sensation with ET tube    Coronary artery disease    Depression    Diabetes mellitus    diet controlled/on meds   Family history of breast cancer    Family history of colon cancer    Fibromyalgia    GERD (gastroesophageal reflux disease)    H/O hiatal hernia    Headache(784.0)    "recurring"   High cholesterol    History of colon polyps    Hyperlipidemia    Hypertension    [place] esophagus    Melanoma (HCC)    melanoma   Migraines    "til ~ 1980"   PONV (postoperative nausea and vomiting)    Restless leg syndrome    Sciatic nerve pain    "from pinched nerve"   Sleep apnea    uses CPAP   Stroke (HCC) 2014   no deficits   Weakness of  right side of body    "I've had PT for it; they don't know what it's from".  CORTISONE INJECTION INTO BACK 08/30/12   Past Surgical History:  Procedure Laterality Date   58 HOUR PH STUDY N/A 12/29/2015   Procedure: 24 HOUR PH STUDY;  Surgeon: Danette Duos, MD;  Location: [facility];  Service: Gastroenterology;  Laterality: N/A;   CARPAL TUNNEL RELEASE Left 07/02/2015   Procedure: CARPAL TUNNEL RELEASE;  Surgeon: Wendolyn Hamburger, MD;  Location: [facility];  Service: Orthopedics;  Laterality: Left;   CATARACT EXTRACTION, BILATERAL Bilateral    Oct and Nov 2017   COLONOSCOPY  06/30/2019   CORONARY ANGIOPLASTY WITH STENT PLACEMENT  06/2011   "1"   CORONARY ARTERY BYPASS GRAFT  2005   CABG X 2   DILATION AND CURETTAGE OF UTERUS     "more than once"   ELBOW ARTHROSCOPY Left 07/02/2015   Procedure: ARTHROSCOPY LEFT ELBOW WITH DEBRIDEMENT AND REMOVAL LOOSE BODY;  Surgeon: Wendolyn Hamburger, MD;  Location: [facility];  Service: Orthopedics;  Laterality: Left;   ESOPHAGEAL DILATION  01/21/2020   Procedure: ESOPHAGEAL DILATION;  Surgeon: Annis Kinder, DO;  Location: [facility];  Service: Gastroenterology;;    ESOPHAGEAL MANOMETRY N/A 12/29/2015   Procedure: ESOPHAGEAL MANOMETRY (EM);  Surgeon: Danette Duos, MD;  Location: [facility];  Service: Gastroenterology;  Laterality: N/A;   ESOPHAGEAL MANOMETRY N/A 07/30/2019   Procedure: ESOPHAGEAL MANOMETRY (EM);  Surgeon: Ace Holder, MD;  Location: [facility];  Service: Gastroenterology;  Laterality: N/A;   ESOPHAGOGASTRODUODENOSCOPY (EGD) WITH PROPOFOL  N/A 01/21/2020   Procedure: ESOPHAGOGASTRODUODENOSCOPY (EGD) WITH PROPOFOL ;  Surgeon: Annis Kinder, DO;  Location: [facility];  Service: Gastroenterology;  Laterality: N/A;   FRACTURE SURGERY  ~ 2005   nose   KNEE ARTHROSCOPY  09/04/2012   Procedure: ARTHROSCOPY KNEE;  Surgeon: Aurther Blue, MD;  Location: [facility];  Service: Orthopedics;  Laterality: Right;  right knee arthroscopy with medial and lateral meniscus debridement   MELANOMA EXCISION Right 10/13/2016   right side of neck   MOUTH SURGERY  2004   "bone replacement; had cadavear bones put in; face was collapsing"   NASAL SEPTUM SURGERY  ~ 1986   TOTAL KNEE ARTHROPLASTY Right 07/07/2013   Procedure: RIGHT TOTAL KNEE ARTHROPLASTY;  Surgeon: Aurther Blue, MD;  Location: [facility];  Service: Orthopedics;  Laterality: Right;   TRANSORAL INCISIONLESS FUNDOPLICATION N/A 01/21/2020   Procedure: TRANSORAL INCISIONLESS FUNDOPLICATION;  Surgeon: Annis Kinder, DO;  Location: [facility];  Service: Gastroenterology;  Laterality: N/A;   UPPER GASTROINTESTINAL ENDOSCOPY     Family History  Problem Relation Age of Onset   Breast cancer Mother 46   Heart disease Father 52       sudden onset due to CAD   Hypertension Sister    Dementia Sister    Diabetes Sister    Cancer - Colon Sister 18   Colon cancer Sister    GER disease Daughter    Hypertension Daughter    Heart disease Brother    Hypertension Brother    Heart attack Neg Hx    Stroke Neg Hx    Esophageal cancer Neg Hx    Rectal cancer Neg Hx    Stomach cancer  Neg Hx    Social History   Socioeconomic History   Marital status: Married    Spouse name: Not on file   Number of children: 2   Years of education: college  Highest education level: Not on file  Occupational History   Occupation: Retired   Tobacco Use   Smoking status: Never   Smokeless tobacco: Never  Vaping Use   Vaping status: Never Used  Substance and Sexual Activity   Alcohol use: Yes    Alcohol/week: 1.0 standard drink of alcohol    Types: 1 Glasses of wine per week    Comment: "occasionally drink wine"   Drug use: No   Sexual activity: Yes  Other Topics Concern   Not on file  Social History Narrative   Drinks 1 cup of coffee a day    Social Drivers of Corporate investment banker Strain: Low Risk  (03/03/2024)   Overall Financial Resource Strain (CARDIA)    Difficulty of Paying Living Expenses: Not hard at all  Food Insecurity: No Food Insecurity (03/03/2024)   [name] Vital Sign    Worried About Running Out of Food in the Last Year: Never true    Ran Out of Food in the Last Year: Never true  Transportation Needs: No Transportation Needs (03/03/2024)   PRAPARE - Administrator, Civil Service (Medical): No    Lack of Transportation (Non-Medical): No  Physical Activity: Inactive (03/03/2024)   Exercise Vital Sign    Days of Exercise per Week: 0 days    Minutes of Exercise per Session: 0 min  Stress: Stress Concern Present (03/03/2024)   [employer] of Occupational Health - Occupational Stress Questionnaire    Feeling of Stress : To some extent  Social Connections: Moderately Integrated (03/03/2024)   Social Connection and Isolation Panel [NHANES]    Frequency of Communication with Friends and Family: More than three times a week    Frequency of Social Gatherings with Friends and Family: More than three times a week    Attends Religious Services: Never    Database administrator or Organizations: Yes    Attends Engineer, structural: More  than 4 times per year    Marital Status: Married    Tobacco Counseling Counseling given: Not Answered    Clinical Intake:  Pre-visit preparation completed: Yes  Pain : No/denies pain     BMI - recorded: 23.42 Nutritional Status: BMI of 19-24  Normal Nutritional Risks: Unintentional weight loss Diabetes: Yes CBG done?: No Did pt. bring in CBG monitor from home?: No  Lab Results  Component Value Date   HGBA1C 8.8 (H) 02/19/2024   HGBA1C 7.7 (H) 02/21/2023   HGBA1C 7.0 (A) 08/22/2022     What is the last grade level you completed in school?: Some college  Interpreter Needed?: No  Information entered by :: [employer]   Activities of Daily Living     03/03/2024   10:29 AM  In your present state of health, do you have any difficulty performing the following activities:  Hearing? 0  Vision? 0  Difficulty concentrating or making decisions? 0  Walking or climbing stairs? 0  Dressing or bathing? 0  Doing errands, shopping? 0  Preparing Food and eating ? N  Using the Toilet? N  In the past six months, have you accidently leaked urine? Y  Do you have problems with loss of bowel control? Y  Managing your Medications? N  Managing your Finances? N  Housekeeping or managing your Housekeeping? N    Patient Care Team: Judithann Novas, MD as PCP - General (Family Medicine) Hugh Madura, MD as PCP - Cardiology (Cardiology) Jacqueline Matsu, MD  as PCP - Sleep Medicine (Sleep Medicine) Amedeo Jupiter, MD as Consulting Physician (Ophthalmology) Reesa Cannon, MD as Referring Physician (Ophthalmology)  Indicate any recent Medical Services you may have received from other than [facility] providers in the past year (date may be approximate).     Assessment:   This is a routine wellness examination for Morgan [name].  Hearing/Vision screen Hearing Screening - Comments:: No difficulties hearing Vision Screening - Comments:: Wear glasses   Goals Addressed             This  Visit's Progress    Patient Stated       Patient states she would like to get everything unpacked in her new home       Depression Screen     03/03/2024   10:32 AM 02/28/2024   11:22 AM 01/22/2024    1:37 PM 07/12/2023   10:58 AM 02/28/2023    9:59 AM 02/21/2023   11:11 AM 08/22/2022   10:04 AM  PHQ 2/9 Scores  PHQ - 2 Score 0 0 2 2 0 0 0  PHQ- 9 Score 1 2 6 8  0 2 4    Fall Risk     03/03/2024   10:28 AM 02/28/2024   11:22 AM 08/14/2023   10:30 AM 02/28/2023   10:00 AM 02/21/2023   11:11 AM  Fall Risk   Falls in the past year? 1 0 1 1 1   Number falls in past yr: 0 0 1 1 1   Injury with Fall? 0 0 0 1 0  Comment    Fx cheek, nose and spine Followed by nerology   Risk for fall due to : History of fall(s) No Fall Risks History of fall(s) Medication side effect;Impaired balance/gait No Fall Risks  Follow up Falls evaluation completed Falls evaluation completed Falls evaluation completed Falls evaluation completed;Education provided;Falls prevention discussed Falls evaluation completed;Falls prevention discussed    MEDICARE RISK AT HOME:  Medicare Risk at Home Any stairs in or around the home?: No If so, are there any without handrails?: No Home free of loose throw rugs in walkways, pet beds, electrical cords, etc?: Yes Adequate lighting in your home to reduce risk of falls?: Yes Life alert?: No Use of a cane, walker or w/c?: No Grab bars in the bathroom?: Yes Shower chair or bench in shower?: Yes Elevated toilet seat or a handicapped toilet?: Yes  TIMED UP AND GO:  Was the test performed?  No  Cognitive Function: 6CIT completed    02/08/2021   10:01 AM 01/19/2020   12:07 PM 12/23/2018   12:11 PM 12/07/2017    9:44 AM 11/14/2016   10:25 AM  MMSE - Mini Mental State Exam  Orientation to time 5 5 5 5 5   Orientation to Place 5 5 5 5 5   Registration 3 3 3 3 3   Attention/ Calculation 5 5 0 0 0  Recall 3 3 3 3 3   Language- name 2 objects   0 0 0  Language- repeat 1 1 1 1 1    Language- follow 3 step command   3 3 3   Language- read & follow direction   0 0 0  Write a sentence   0 0 0  Copy design   0 0 0  Total score   20 20 20         03/03/2024   10:24 AM 02/28/2023   10:03 AM  6CIT Screen  What Year? 0 points 0 points  What month? 0 points  0 points  What time? 0 points 0 points  Count back from 20 0 points 0 points  Months in reverse 0 points 0 points  Repeat phrase 0 points 0 points  Total Score 0 points 0 points    Immunizations Immunization History  Administered Date(s) Administered   Fluad Quad(high Dose 65+) 07/17/2019   Influenza, High Dose Seasonal PF 08/04/2016, 08/21/2017, 08/07/2018, 07/06/2020, 06/28/2021, 06/23/2023   Influenza,inj,Quad PF,6+ Mos 07/23/2014, 08/10/2015   PFIZER Comirnaty(Gray Top)Covid-19 Tri-Sucrose Vaccine 02/22/2021   PFIZER(Purple Top)SARS-COV-2 Vaccination 12/05/2019, 12/25/2019, 07/06/2020   PNEUMOCOCCAL CONJUGATE-20 06/23/2023   Pfizer Covid-19 Vaccine Bivalent Booster 79yrs & up 08/22/2021, 04/01/2022   Pfizer(Comirnaty)Fall Seasonal Vaccine 12 years and older 09/13/2022, 08/03/2023   Pneumococcal Conjugate-13 10/27/2014   Pneumococcal Polysaccharide-23 09/02/2007, 10/03/2013   Respiratory Syncytial Virus Vaccine,Recomb Aduvanted(Arexvy) 09/13/2022   Td 11/14/2007   Tdap 06/25/2018   Zoster Recombinant(Shingrix) 03/05/2018, 03/17/2020   Zoster, Live 02/09/2010    Screening Tests Health Maintenance  Topic Date Due   OPHTHALMOLOGY EXAM  10/13/2021   COVID-19 Vaccine (9 - Pfizer risk 2024-25 season) 01/31/2024   INFLUENZA VACCINE  06/13/2024   HEMOGLOBIN A1C  08/20/2024   Diabetic kidney evaluation - eGFR measurement  02/18/2025   Diabetic kidney evaluation - Urine ACR  02/18/2025   FOOT EXAM  02/27/2025   Medicare Annual Wellness (AWV)  03/03/2025   MAMMOGRAM  03/20/2025   DTaP/Tdap/Td (3 - Td or Tdap) 06/25/2028   Pneumonia Vaccine 56+ Years old  Completed   DEXA SCAN  Completed   Zoster  Vaccines- Shingrix  Completed   HPV VACCINES  Aged Out   Meningococcal B Vaccine  Aged Out   Colonoscopy  Discontinued   Hepatitis C Screening  Discontinued    Health Maintenance  Health Maintenance Due  Topic Date Due   OPHTHALMOLOGY EXAM  10/13/2021   COVID-19 Vaccine (9 - Pfizer risk 2024-25 season) 01/31/2024   Health Maintenance Items Addressed:records request has been sent to [facility] for last eye exam.  Additional Screening:  Vision Screening: Recommended annual ophthalmology exams for early detection of glaucoma and other disorders of the eye.  Dental Screening: Recommended annual dental exams for proper oral hygiene  Community Resource Referral / Chronic Care Management: CRR required this visit?  No   CCM required this visit?  No     Plan:     I have personally reviewed and noted the following in the patient's chart:   Medical and social history Use of alcohol, tobacco or illicit drugs  Current medications and supplements including opioid prescriptions. Patient is not currently taking opioid prescriptions. Functional ability and status Nutritional status Physical activity Advanced directives List of other physicians Hospitalizations, surgeries, and ER visits in previous 12 months Vitals Screenings to include cognitive, depression, and falls Referrals and appointments  In addition, I have reviewed and discussed with patient certain preventive protocols, quality metrics, and best practice recommendations. A written personalized care plan for preventive services as well as general preventive health recommendations were provided to patient.     Morgan [name], [place]   03/03/2024   After Visit Summary: (MyChart) Due to this being a telephonic visit, the after visit summary with patients personalized plan was offered to patient via MyChart   Notes: Nothing significant to report at this time.

## 2024-03-03 NOTE — Therapy (Signed)
 OUTPATIENT SPEECH LANGUAGE PATHOLOGY  TREATMENT   Patient Name: Morgan [name] MRN: [id number] DOB:[birth year], 83 y.o., female Today's Date: 03/03/2024  PCP: Herby Lolling, MD  REFERRING PROVIDER: Devora Folks, MD   End of Session - 03/03/24 0852     Visit Number 3    Number of Visits 24    Date for SLP Re-Evaluation 05/14/24    SLP Start Time 0852    SLP Stop Time  0930    SLP Time Calculation (min) 38 min    Activity Tolerance Patient tolerated treatment well              Patient Active Problem List   Diagnosis Date Noted   Microalbuminuria due to type 2 diabetes mellitus (HCC) 02/28/2024   Enteritis due to Norovirus 01/18/2024   Elevated lipase 01/18/2024   Acute post-traumatic headache, not intractable 08/14/2023   Bursitis of left hip 07/12/2023   GAD (generalized anxiety disorder) 02/21/2023   Closed nondisplaced fracture of sixth cervical vertebra with routine healing 08/08/2022   Acute renal insufficiency 08/08/2022   Diabetic mononeuropathy associated with type 2 diabetes mellitus (HCC) 05/19/2022   Accidental fall 03/10/2022   Hypomania (HCC) 02/10/2022   Weight loss 02/10/2022   Insomnia 08/16/2021   Iron deficiency anemia 08/16/2021   OAB (overactive bladder) 08/16/2021   Fire ant bite 08/19/2020   Hiatal hernia with GERD 01/21/2020   S/P laparoscopic fundoplication 01/21/2020   Globus sensation    Family history of breast cancer    History of colon polyps    Chronic diarrhea 01/14/2019   Malignant melanoma (HCC) 11/23/2016   Urge incontinence 11/23/2016   Osteopenia 09/15/2014   History of TIA (transient ischemic attack) 05/19/2014   Obesity (BMI 30-39.9) 10/08/2013   OA (osteoarthritis) of knee 07/07/2013   Chronic low back pain 05/09/2013   Obstructive sleep apnea 05/09/2013   GERD (gastroesophageal reflux disease) 05/09/2013   Hiatal hernia 05/09/2013   Fibromyalgia syndrome 05/09/2013   MDD (major depressive disorder), recurrent episode,  moderate (HCC) 05/09/2013   RLS (restless legs syndrome) 05/09/2013   Type 2 diabetes mellitus with vascular disease (HCC) 05/09/2013   Hyperlipidemia associated with type 2 diabetes mellitus (HCC) 05/09/2013   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  Pt accompanied by: self  PERTINENT HISTORY: Per Kaitlin Paich's note, 02/06/24, "Dear Ms. [facility], It was our pleasure to participate in your care. We have typed up brief summary of what we discussed.  1. Unsteady gait - high falls risk  - no signs and symptoms suggestive of injury since 12/03/2023. Strict Emergency Room precautions   2. 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 Headaches - tension type versus hypertensive versus migraine with aura, improving  MRI Brain without contrast + NeuroQuant - 1. No reversible cause for symptoms. Mild chronic small vessel ischemia in the cerebral white matter. 2. Brain atrophy with NeuroQuant volumetric analysis of the brain,  see details on [employer].   Labs - ESR, CRP, CMP 10/17/2023 - satisfactory  4. Leg Length Discrepancy  X-ray hip with pelvis 10/09/2023 - unremarkable   5. Cognitive Impairment  Reports concerns about memory and forgetfulness. At 83 years old, worried about cognitive decline. No history of head injury or neurological symptoms. Agreed to perform bedside memory test and brain scan with NeuroQuant without  contrast to assess brain volume. Discussed the importance of mentally stimulating activities, good sunlight exposure, and dietary changes to improve cognition. - MRI Brain reviewed as above   6. Unintentional Weight Loss, 15 lbs in 3 months - stablized  Lost 15 pounds unintentionally. Denies trouble swallowing, hematuria, melena, chest pain, shortness of breath, lightheadedness, dizziness, neck pain, and lower back pain. Reports daytime sweats for the past six months. Recent lab work and x-ray of pelvis and lower back were satisfactory. Discussed potential causes including thyroid  dysfunction and anemia. Agreed to check thyroid  function, CBC, iron levels, and urine analysis. Referred to gastroenterology and primary care for further evaluation. Encouraged to use meal supplement shakes and monitor weight at home. - Labs reviewed: CBC, CMP, Iron, Ferritin, TSH, Urinalysis, Culture - Labs are okay with minor variations.   - follow-up with primary care provider  - Encourage meal supplement shakes and monitor weight at home Assessment & Plan 7. Recent Fall, without head strike, no new symptoms, no signs and symptoms suggestive of injury  Experienced a fall last night after turning too quickly and losing balance. Reports muscle tightness and soreness but denies chest pain, shortness of breath, head injury, loss of consciousness, or bruising. No new symptoms such as back pain, changes in bowel or bladder control, headaches, nausea, or vomiting. Physical exam revealed no bruising or tenderness in the ribs or lower back. Slight unbalance noted during walking test, but no lightheadedness or dizziness. x-rays of thoracic spine 12/11/2023 - Thoracic compression fracture and lumbar spine - Multilevel degenerative changes. No acute findings   8. Thoracic Compression Fracture  -Per orthopedics. MRIs are pending  9. General Health Maintenance Encouraged to maintain a healthy diet, sleep well, and engage in mentally  stimulating activities. Advised to see ENT or primary care for ear cleaning to address blocked ear and potential equilibrium issues. - Recommend seeing ENT or primary care for ear cleaning to address blocked ear and potential equilibrium issue"  DIAGNOSTIC FINDINGS: MRI brain, 12/14/23, "FINDINGS: Brain: No acute infarction, hemorrhage, hydrocephalus, extra-axial collection or mass lesion. Chronic small vessel ischemic gliosis in the cerebral white matter is mild for age. No chronic blood products or abnormal mineralization.   Vascular: Normal flow voids.   Skull and upper cervical spine: Normal marrow signal.   Sinuses/Orbits: Negative   NeuroQuant Findings:   Volumetric analysis of the brain was performed, with a fully detailed report in [employer]. Briefly, the comparison with age and gender matched reference reveals reasonable tagging cortex. Whole brain volume is low normal. The bilateral hippocampus is less than expected.   IMPRESSION: 1. No reversible cause for symptoms. Mild chronic small vessel ischemia in the cerebral white matter. 2. Brain atrophy with NeuroQuant volumetric analysis of the brain, see details on [facility]."  PAIN:  Are you having pain? No   FALLS: Has patient fallen in last 6 months?  Yes  LIVING ENVIRONMENT: Lives with: lives with their spouse and in independent living apartment at [facility]   PLOF:  Level of assistance: Independent with ADLs Employment: Retired   PATIENT GOALS   for memory and communication to improve  OBJECTIVE:   TODAY'S TREATMENT:  Education provided  re: name recall strategies. Pt require min/mod A initially to utilize to code/recall x5 people/names immediately. After 10 minute delay, pt recalled 5/5 people/name. Will continue to focus on in upcoming sessions. Noted improvement in wordfindfing ability during informal conversational exchanges.   PATIENT EDUCATION: Education details: as above Person educated:  Patient Education method: Explanation Education comprehension: verbalized understanding  HOME EXERCISE PROGRAM:        Practice SFA and name recall strategies    GOALS:  Goals reviewed with patient? Yes  SHORT TERM GOALS: Target date: 10 sessions  With Min A, patient will complete a semantic feature analysis with at least 3 relevant features for 8/10 target words to improve word-finding skills.   Baseline: Goal status: INITIAL   2.  Pt will ID 2-3 strategies for improved attention/memory with min A. Baseline:  Goal status: INITIAL  3.  Pt will report implementation of 2-3 strategies for improved attention and memory.  Baseline:  Goal status: INITIAL  LONG TERM GOALS: Target date: 12 weeks  Pt will utilize compensatory strategies for anomia with modified independence during informal conversational exchanges. Baseline:  Goal status: INITIAL  2.  Pt will demonstrate knowledge of appropriate of appropriate activities to promote cognitive-linguistic functioning outside of therapy. Baseline:  Goal status: INITIAL  3.  Pt will report improved memory per PROM. Baseline: Low - 20 to 29 Memory Satisfaction (T-score: 37) Below Average - 30-39 Memory Ability (T-score: 39) Average - 40 to 60 use of Memory Strategies (T-score: 53) Goal status: INITIAL   ASSESSMENT:  CLINICAL IMPRESSION:  Patient is a 83 y.o. female who was seen today for cognitive-linguistic tx. Pt with reports of worsening short term memory and wordfinding which has been highlighted during recent move to ILF. Pt expresses frustration with CLOF and is highly motivated to improve cognitive-linguistic functioning. Initial assessment was completed via informal means, formal cognitive assessment - Addenbrooke's Cognitive Evaluation (ACE-III), and PROM. Pt presents with mild cognitive-linguistic deficits affecting attention, memory, and verbal fluency. See details of tx session above. Recommend course of ST targeting  education and compensations to improve cognitive-linguistic functioning and overall QoL.   OBJECTIVE IMPAIRMENTS include attention, memory, and expressive language. These impairments are limiting patient from effectively communicating at home and in community. Factors affecting potential to achieve goals and functional outcome are ability to learn/carryover information.. Patient will benefit from skilled SLP services to address above impairments and improve overall function.  REHAB POTENTIAL: Good  PLAN: SLP FREQUENCY: 1-2x/week  SLP DURATION: 12 weeks  PLANNED INTERVENTIONS: Cueing hierachy, Cognitive reorganization, Internal/external aids, Functional tasks, Multimodal communication approach, and SLP instruction and feedback   Dia Forget, M.S., CCC-SLP Speech-Language Pathologist [facility] [phone] [place] Clayman)   [facility] Outpatient Rehabilitation at [facility] [address] [place], [place], [address] Phone: [phone]   Fax:  [phone]

## 2024-03-03 NOTE — Patient Instructions (Signed)
 Morgan [name] , Thank you for taking time to come for your Medicare Wellness Visit. I appreciate your ongoing commitment to your health goals. Please review the following plan we discussed and let me know if I can assist you in the future.   Referrals/Orders/Follow-Ups/Clinician Recommendations: follow up for next AWV.  This is a list of the screening recommended for you and due dates:  Health Maintenance  Topic Date Due   Eye exam for diabetics  10/13/2021   COVID-19 Vaccine (9 - Pfizer risk 2024-25 season) 01/31/2024   Flu Shot  06/13/2024   Hemoglobin A1C  08/20/2024   Yearly kidney function blood test for diabetes  02/18/2025   Yearly kidney health urinalysis for diabetes  02/18/2025   Complete foot exam   02/27/2025   Medicare Annual Wellness Visit  03/03/2025   Mammogram  03/20/2025   DTaP/Tdap/Td vaccine (3 - Td or Tdap) 06/25/2028   Pneumonia Vaccine  Completed   DEXA scan (bone density measurement)  Completed   Zoster (Shingles) Vaccine  Completed   HPV Vaccine  Aged Out   Meningitis B Vaccine  Aged Out   Colon Cancer Screening  Discontinued   Hepatitis C Screening  Discontinued    Advanced directives: (Copy Requested) Please bring a copy of your health care power of attorney and living will to the office to be added to your chart at your convenience. You can mail to [facility] [address] [place], [place] [address] or email to [email or web address]  Next Medicare Annual Wellness Visit scheduled for next year: Yes

## 2024-03-05 ENCOUNTER — Ambulatory Visit

## 2024-03-05 ENCOUNTER — Other Ambulatory Visit: Payer: Self-pay | Admitting: Cardiology

## 2024-03-05 DIAGNOSIS — R41841 Cognitive communication deficit: Secondary | ICD-10-CM

## 2024-03-05 NOTE — Therapy (Signed)
 OUTPATIENT SPEECH LANGUAGE PATHOLOGY  TREATMENT   Patient Name: Morgan [name] MRN: [id number] DOB:12-20-1940, 83 y.o., female Today's Date: 03/05/2024  PCP: Herby Lolling, MD  REFERRING PROVIDER: Devora Folks, MD   End of Session - 03/05/24 0848     Visit Number 4    Number of Visits 24    Date for SLP Re-Evaluation 05/14/24    SLP Start Time 0848    SLP Stop Time  0930    SLP Time Calculation (min) 42 min    Activity Tolerance Patient tolerated treatment well              Patient Active Problem List   Diagnosis Date Noted   Microalbuminuria due to type 2 diabetes mellitus (HCC) 02/28/2024   Enteritis due to Norovirus 01/18/2024   Elevated lipase 01/18/2024   Acute post-traumatic headache, not intractable 08/14/2023   Bursitis of left hip 07/12/2023   GAD (generalized anxiety disorder) 02/21/2023   Closed nondisplaced fracture of sixth cervical vertebra with routine healing 08/08/2022   Acute renal insufficiency 08/08/2022   Diabetic mononeuropathy associated with type 2 diabetes mellitus (HCC) 05/19/2022   Accidental fall 03/10/2022   Hypomania (HCC) 02/10/2022   Weight loss 02/10/2022   Insomnia 08/16/2021   Iron deficiency anemia 08/16/2021   OAB (overactive bladder) 08/16/2021   Fire ant bite 08/19/2020   Hiatal hernia with GERD 01/21/2020   S/P laparoscopic fundoplication 01/21/2020   Globus sensation    Family history of breast cancer    History of colon polyps    Chronic diarrhea 01/14/2019   Malignant melanoma (HCC) 11/23/2016   Urge incontinence 11/23/2016   Osteopenia 09/15/2014   History of TIA (transient ischemic attack) 05/19/2014   Obesity (BMI 30-39.9) 10/08/2013   OA (osteoarthritis) of knee 07/07/2013   Chronic low back pain 05/09/2013   Obstructive sleep apnea 05/09/2013   GERD (gastroesophageal reflux disease) 05/09/2013   Hiatal hernia 05/09/2013   Fibromyalgia syndrome 05/09/2013   MDD (major depressive disorder), recurrent episode,  moderate (HCC) 05/09/2013   RLS (restless legs syndrome) 05/09/2013   Type 2 diabetes mellitus with vascular disease (HCC) 05/09/2013   Hyperlipidemia associated with type 2 diabetes mellitus (HCC) 05/09/2013   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  Pt accompanied by: self  PERTINENT HISTORY: Per Kaitlin Paich's note, 02/06/24, "Dear Ms. [facility], It was our pleasure to participate in your care. We have typed up brief summary of what we discussed.  1. Unsteady gait - high falls risk  - no signs and symptoms suggestive of injury since 12/03/2023. Strict Emergency Room precautions   2. 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 Headaches - tension type versus hypertensive versus migraine with aura, improving  MRI Brain without contrast + NeuroQuant - 1. No reversible cause for symptoms. Mild chronic small vessel ischemia in the cerebral white matter. 2. Brain atrophy with NeuroQuant volumetric analysis of the brain,  see details on [employer].   Labs - ESR, CRP, CMP 10/17/2023 - satisfactory  4. Leg Length Discrepancy  X-ray hip with pelvis 10/09/2023 - unremarkable   5. Cognitive Impairment  Reports concerns about memory and forgetfulness. At 83 years old, worried about cognitive decline. No history of head injury or neurological symptoms. Agreed to perform bedside memory test and brain scan with NeuroQuant without  contrast to assess brain volume. Discussed the importance of mentally stimulating activities, good sunlight exposure, and dietary changes to improve cognition. - MRI Brain reviewed as above   6. Unintentional Weight Loss, 15 lbs in 3 months - stablized  Lost 15 pounds unintentionally. Denies trouble swallowing, hematuria, melena, chest pain, shortness of breath, lightheadedness, dizziness, neck pain, and lower back pain. Reports daytime sweats for the past six months. Recent lab work and x-ray of pelvis and lower back were satisfactory. Discussed potential causes including thyroid  dysfunction and anemia. Agreed to check thyroid  function, CBC, iron levels, and urine analysis. Referred to gastroenterology and primary care for further evaluation. Encouraged to use meal supplement shakes and monitor weight at home. - Labs reviewed: CBC, CMP, Iron, Ferritin, TSH, Urinalysis, Culture - Labs are okay with minor variations.   - follow-up with primary care provider  - Encourage meal supplement shakes and monitor weight at home Assessment & Plan 7. Recent Fall, without head strike, no new symptoms, no signs and symptoms suggestive of injury  Experienced a fall last night after turning too quickly and losing balance. Reports muscle tightness and soreness but denies chest pain, shortness of breath, head injury, loss of consciousness, or bruising. No new symptoms such as back pain, changes in bowel or bladder control, headaches, nausea, or vomiting. Physical exam revealed no bruising or tenderness in the ribs or lower back. Slight unbalance noted during walking test, but no lightheadedness or dizziness. x-rays of thoracic spine 12/11/2023 - Thoracic compression fracture and lumbar spine - Multilevel degenerative changes. No acute findings   8. Thoracic Compression Fracture  -Per orthopedics. MRIs are pending  9. General Health Maintenance Encouraged to maintain a healthy diet, sleep well, and engage in mentally  stimulating activities. Advised to see ENT or primary care for ear cleaning to address blocked ear and potential equilibrium issues. - Recommend seeing ENT or primary care for ear cleaning to address blocked ear and potential equilibrium issue"  DIAGNOSTIC FINDINGS: MRI brain, 12/14/23, "FINDINGS: Brain: No acute infarction, hemorrhage, hydrocephalus, extra-axial collection or mass lesion. Chronic small vessel ischemic gliosis in the cerebral white matter is mild for age. No chronic blood products or abnormal mineralization.   Vascular: Normal flow voids.   Skull and upper cervical spine: Normal marrow signal.   Sinuses/Orbits: Negative   NeuroQuant Findings:   Volumetric analysis of the brain was performed, with a fully detailed report in [employer]. Briefly, the comparison with age and gender matched reference reveals reasonable tagging cortex. Whole brain volume is low normal. The bilateral hippocampus is less than expected.   IMPRESSION: 1. No reversible cause for symptoms. Mild chronic small vessel ischemia in the cerebral white matter. 2. Brain atrophy with NeuroQuant volumetric analysis of the brain, see details on [facility]."  PAIN:  Are you having pain? No   FALLS: Has patient fallen in last 6 months?  Yes  LIVING ENVIRONMENT: Lives with: lives with their spouse and in independent living apartment at [facility]   PLOF:  Level of assistance: Independent with ADLs Employment: Retired   PATIENT GOALS   for memory and communication to improve  OBJECTIVE:   TODAY'S TREATMENT:  Education initiated  re: attention including types of attention, compensations to promote attention, and role attention plays in other cognitive-linguistic domains. Pt able to ID areas of focus to help improve her attention including focusing on x1 task at at time, clearing away clutter, and taking rest breaks. Introduced the Pomodoro method for rest breaks. Pt tasked with implementing x2-3  strategies and reporting progress for next session.   PATIENT EDUCATION: Education details: as above Person educated: Patient Education method: Explanation Education comprehension: verbalized understanding  HOME EXERCISE PROGRAM:        As above    GOALS:  Goals reviewed with patient? Yes  SHORT TERM GOALS: Target date: 10 sessions  With Min A, patient will complete a semantic feature analysis with at least 3 relevant features for 8/10 target words to improve word-finding skills.   Baseline: Goal status: INITIAL   2.  Pt will ID 2-3 strategies for improved attention/memory with min A. Baseline:  Goal status: INITIAL  3.  Pt will report implementation of 2-3 strategies for improved attention and memory.  Baseline:  Goal status: INITIAL  LONG TERM GOALS: Target date: 12 weeks  Pt will utilize compensatory strategies for anomia with modified independence during informal conversational exchanges. Baseline:  Goal status: INITIAL  2.  Pt will demonstrate knowledge of appropriate of appropriate activities to promote cognitive-linguistic functioning outside of therapy. Baseline:  Goal status: INITIAL  3.  Pt will report improved memory per PROM. Baseline: Low - 20 to 29 Memory Satisfaction (T-score: 37) Below Average - 30-39 Memory Ability (T-score: 39) Average - 40 to 60 use of Memory Strategies (T-score: 53) Goal status: INITIAL   ASSESSMENT:  CLINICAL IMPRESSION:  Patient is a 83 y.o. female who was seen today for cognitive-linguistic tx. Pt with reports of worsening short term memory and wordfinding which has been highlighted during recent move to ILF. Pt expresses frustration with CLOF and is highly motivated to improve cognitive-linguistic functioning. Initial assessment was completed via informal means, formal cognitive assessment - Addenbrooke's Cognitive Evaluation (ACE-III), and PROM. Pt presents with mild cognitive-linguistic deficits affecting attention,  memory, and verbal fluency. See details of tx session above. Recommend course of ST targeting education and compensations to improve cognitive-linguistic functioning and overall QoL.   OBJECTIVE IMPAIRMENTS include attention, memory, and expressive language. These impairments are limiting patient from effectively communicating at home and in community. Factors affecting potential to achieve goals and functional outcome are ability to learn/carryover information.. Patient will benefit from skilled SLP services to address above impairments and improve overall function.  REHAB POTENTIAL: Good  PLAN: SLP FREQUENCY: 1-2x/week  SLP DURATION: 12 weeks  PLANNED INTERVENTIONS: Cueing hierachy, Cognitive reorganization, Internal/external aids, Functional tasks, Multimodal communication approach, and SLP instruction and feedback   Dia Forget, M.S., CCC-SLP Speech-Language Pathologist [place] [facility] [phone] [place])   [facility] Outpatient Rehabilitation at [facility] [address] [place], [place], [address] Phone: [phone]   Fax:  [phone]

## 2024-03-10 ENCOUNTER — Ambulatory Visit

## 2024-03-10 DIAGNOSIS — R41841 Cognitive communication deficit: Secondary | ICD-10-CM | POA: Diagnosis not present

## 2024-03-10 NOTE — Therapy (Signed)
 OUTPATIENT SPEECH LANGUAGE PATHOLOGY  TREATMENT   Patient Name: Morgan [name] MRN: [id number] DOB:October 01, 1941, 83 y.o., female Today's Date: 03/10/2024  PCP: Herby Lolling, MD  REFERRING PROVIDER: Devora Folks, MD   End of Session - 03/10/24 0856     Visit Number 5    Number of Visits 24    Date for SLP Re-Evaluation 05/14/24    SLP Start Time 0855    SLP Stop Time  0930    SLP Time Calculation (min) 35 min    Activity Tolerance Patient tolerated treatment well              Patient Active Problem List   Diagnosis Date Noted   Microalbuminuria due to type 2 diabetes mellitus (HCC) 02/28/2024   Enteritis due to Norovirus 01/18/2024   Elevated lipase 01/18/2024   Acute post-traumatic headache, not intractable 08/14/2023   Bursitis of left hip 07/12/2023   GAD (generalized anxiety disorder) 02/21/2023   Closed nondisplaced fracture of sixth cervical vertebra with routine healing 08/08/2022   Acute renal insufficiency 08/08/2022   Diabetic mononeuropathy associated with type 2 diabetes mellitus (HCC) 05/19/2022   Accidental fall 03/10/2022   Hypomania (HCC) 02/10/2022   Weight loss 02/10/2022   Insomnia 08/16/2021   Iron deficiency anemia 08/16/2021   OAB (overactive bladder) 08/16/2021   Fire ant bite 08/19/2020   Hiatal hernia with GERD 01/21/2020   S/P laparoscopic fundoplication 01/21/2020   Globus sensation    Family history of breast cancer    History of colon polyps    Chronic diarrhea 01/14/2019   Malignant melanoma (HCC) 11/23/2016   Urge incontinence 11/23/2016   Osteopenia 09/15/2014   History of TIA (transient ischemic attack) 05/19/2014   Obesity (BMI 30-39.9) 10/08/2013   OA (osteoarthritis) of knee 07/07/2013   Chronic low back pain 05/09/2013   Obstructive sleep apnea 05/09/2013   GERD (gastroesophageal reflux disease) 05/09/2013   Hiatal hernia 05/09/2013   Fibromyalgia syndrome 05/09/2013   MDD (major depressive disorder), recurrent episode,  moderate (HCC) 05/09/2013   RLS (restless legs syndrome) 05/09/2013   Type 2 diabetes mellitus with vascular disease (HCC) 05/09/2013   Hyperlipidemia associated with type 2 diabetes mellitus (HCC) 05/09/2013   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  Pt accompanied by: self  PERTINENT HISTORY: Per Kaitlin Paich's note, 02/06/24, "Dear Ms. [facility], It was our pleasure to participate in your care. We have typed up brief summary of what we discussed.  1. Unsteady gait - high falls risk  - no signs and symptoms suggestive of injury since 12/03/2023. Strict Emergency Room precautions   2. 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 Headaches - tension type versus hypertensive versus migraine with aura, improving  MRI Brain without contrast + NeuroQuant - 1. No reversible cause for symptoms. Mild chronic small vessel ischemia in the cerebral white matter. 2. Brain atrophy with NeuroQuant volumetric analysis of the brain,  see details on [employer].   Labs - ESR, CRP, CMP 10/17/2023 - satisfactory  4. Leg Length Discrepancy  X-ray hip with pelvis 10/09/2023 - unremarkable   5. Cognitive Impairment  Reports concerns about memory and forgetfulness. At 83 years old, worried about cognitive decline. No history of head injury or neurological symptoms. Agreed to perform bedside memory test and brain scan with NeuroQuant without  contrast to assess brain volume. Discussed the importance of mentally stimulating activities, good sunlight exposure, and dietary changes to improve cognition. - MRI Brain reviewed as above   6. Unintentional Weight Loss, 15 lbs in 3 months - stablized  Lost 15 pounds unintentionally. Denies trouble swallowing, hematuria, melena, chest pain, shortness of breath, lightheadedness, dizziness, neck pain, and lower back pain. Reports daytime sweats for the past six months. Recent lab work and x-ray of pelvis and lower back were satisfactory. Discussed potential causes including thyroid  dysfunction and anemia. Agreed to check thyroid  function, CBC, iron levels, and urine analysis. Referred to gastroenterology and primary care for further evaluation. Encouraged to use meal supplement shakes and monitor weight at home. - Labs reviewed: CBC, CMP, Iron, Ferritin, TSH, Urinalysis, Culture - Labs are okay with minor variations.   - follow-up with primary care provider  - Encourage meal supplement shakes and monitor weight at home Assessment & Plan 7. Recent Fall, without head strike, no new symptoms, no signs and symptoms suggestive of injury  Experienced a fall last night after turning too quickly and losing balance. Reports muscle tightness and soreness but denies chest pain, shortness of breath, head injury, loss of consciousness, or bruising. No new symptoms such as back pain, changes in bowel or bladder control, headaches, nausea, or vomiting. Physical exam revealed no bruising or tenderness in the ribs or lower back. Slight unbalance noted during walking test, but no lightheadedness or dizziness. x-rays of thoracic spine 12/11/2023 - Thoracic compression fracture and lumbar spine - Multilevel degenerative changes. No acute findings   8. Thoracic Compression Fracture  -Per orthopedics. MRIs are pending  9. General Health Maintenance Encouraged to maintain a healthy diet, sleep well, and engage in mentally  stimulating activities. Advised to see ENT or primary care for ear cleaning to address blocked ear and potential equilibrium issues. - Recommend seeing ENT or primary care for ear cleaning to address blocked ear and potential equilibrium issue"  DIAGNOSTIC FINDINGS: MRI brain, 12/14/23, "FINDINGS: Brain: No acute infarction, hemorrhage, hydrocephalus, extra-axial collection or mass lesion. Chronic small vessel ischemic gliosis in the cerebral white matter is mild for age. No chronic blood products or abnormal mineralization.   Vascular: Normal flow voids.   Skull and upper cervical spine: Normal marrow signal.   Sinuses/Orbits: Negative   NeuroQuant Findings:   Volumetric analysis of the brain was performed, with a fully detailed report in [employer]. Briefly, the comparison with age and gender matched reference reveals reasonable tagging cortex. Whole brain volume is low normal. The bilateral hippocampus is less than expected.   IMPRESSION: 1. No reversible cause for symptoms. Mild chronic small vessel ischemia in the cerebral white matter. 2. Brain atrophy with NeuroQuant volumetric analysis of the brain, see details on [employer] [facility]."  PAIN:  Are you having pain? No   FALLS: Has patient fallen in last 6 months?  Yes  LIVING ENVIRONMENT: Lives with: lives with their spouse and in independent living apartment at [facility]   PLOF:  Level of assistance: Independent with ADLs Employment: Retired   PATIENT GOALS   for memory and communication to improve  OBJECTIVE:   TODAY'S TREATMENT:  Reviewed education  re: attention including types of attention, compensations to promote attention, and role attention plays in other cognitive-linguistic domains. Reviewed pt's area focus to help improve her attention including focusing on x1 task at at time, clearing away clutter, and taking rest breaks. Pt reports improvement in focusing x1 task at a time and clearing away clutter. Rest  breaks continue to be difficult as pt prefers to work until completion. Discussed importance on maintaining physically, mentally, and socially active. 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 Reviewed SFA for anomia, although no s/sx anomia noted during session. Pt with subjective report of improving anomia since starting ST.   PATIENT EDUCATION: Education details: as above Person educated: Patient Education method: Explanation Education comprehension: verbalized understanding  HOME EXERCISE PROGRAM:        As above    GOALS:  Goals reviewed with patient? Yes  SHORT TERM GOALS: Target date: 10 sessions  With Min A, patient will complete a semantic feature analysis with at least 3 relevant features for 8/10 target words to improve word-finding skills.   Baseline: Goal status: INITIAL   2.  Pt will ID 2-3 strategies for improved attention/memory with min A. Baseline:  Goal status: INITIAL  3.  Pt will report implementation of 2-3 strategies for improved attention and memory.  Baseline:  Goal status: INITIAL  LONG TERM GOALS: Target date: 12 weeks  Pt will utilize compensatory strategies for anomia with modified independence during informal conversational exchanges. Baseline:  Goal status: INITIAL  2.  Pt will demonstrate knowledge of appropriate of appropriate activities to promote cognitive-linguistic functioning outside of therapy. Baseline:  Goal status: INITIAL  3.  Pt will report improved memory per PROM. Baseline: Low - 20 to 29 Memory Satisfaction (T-score: 37) Below Average - 30-39 Memory Ability (T-score: 39) Average - 40 to 60 use of Memory Strategies (T-score: 53) Goal status: INITIAL   ASSESSMENT:  CLINICAL IMPRESSION:  Patient is a 83 y.o. female who was seen today for cognitive-linguistic tx. Pt with reports of worsening short term memory and wordfinding which has been highlighted during recent move to ILF.  Pt expresses frustration with CLOF and is highly motivated to improve cognitive-linguistic functioning. Initial assessment was completed via informal means, formal cognitive assessment - Addenbrooke's Cognitive Evaluation (ACE-III), and PROM. Pt presents with mild cognitive-linguistic deficits affecting attention, memory, and verbal fluency. See details of tx session above. Recommend course of ST targeting education and compensations to improve cognitive-linguistic functioning and overall QoL.   OBJECTIVE IMPAIRMENTS include attention, memory, and expressive language. These impairments are limiting patient from effectively communicating at home and in community. Factors affecting potential to achieve goals and functional outcome are ability to learn/carryover information.. Patient will benefit from skilled SLP services to address above impairments and improve overall function.  REHAB POTENTIAL: Good  PLAN: SLP FREQUENCY: 1-2x/week  SLP DURATION: 12 weeks  PLANNED INTERVENTIONS: Cueing hierachy, Cognitive reorganization, Internal/external aids, Functional tasks, Multimodal communication approach, and SLP instruction and feedback   Dia Forget, M.S., CCC-SLP Speech-Language Pathologist [place] [facility] [phone] Rogers Clayman)   [facility] Outpatient Rehabilitation at [facility] [address] [place], [place], [address] Phone: [phone]   Fax:  [phone]

## 2024-03-12 ENCOUNTER — Ambulatory Visit

## 2024-03-12 DIAGNOSIS — R41841 Cognitive communication deficit: Secondary | ICD-10-CM

## 2024-03-12 NOTE — Therapy (Signed)
 OUTPATIENT SPEECH LANGUAGE PATHOLOGY  TREATMENT   Patient Name: Morgan [name] MRN: [id number] DOB:1941/07/11, 83 y.o., female Today's Date: 03/12/2024  PCP: Herby Lolling, MD  REFERRING PROVIDER: Devora Folks, MD   End of Session - 03/12/24 0945     Visit Number 6    Number of Visits 24    Date for SLP Re-Evaluation 05/14/24    SLP Start Time 0845    SLP Stop Time  0930    SLP Time Calculation (min) 45 min    Activity Tolerance Patient tolerated treatment well              Patient Active Problem List   Diagnosis Date Noted   Microalbuminuria due to type 2 diabetes mellitus (HCC) 02/28/2024   Enteritis due to Norovirus 01/18/2024   Elevated lipase 01/18/2024   Acute post-traumatic headache, not intractable 08/14/2023   Bursitis of left hip 07/12/2023   GAD (generalized anxiety disorder) 02/21/2023   Closed nondisplaced fracture of sixth cervical vertebra with routine healing 08/08/2022   Acute renal insufficiency 08/08/2022   Diabetic mononeuropathy associated with type 2 diabetes mellitus (HCC) 05/19/2022   Accidental fall 03/10/2022   Hypomania (HCC) 02/10/2022   Weight loss 02/10/2022   Insomnia 08/16/2021   Iron deficiency anemia 08/16/2021   OAB (overactive bladder) 08/16/2021   Fire ant bite 08/19/2020   Hiatal hernia with GERD 01/21/2020   S/P laparoscopic fundoplication 01/21/2020   Globus sensation    Family history of breast cancer    History of colon polyps    Chronic diarrhea 01/14/2019   Malignant melanoma (HCC) 11/23/2016   Urge incontinence 11/23/2016   Osteopenia 09/15/2014   History of TIA (transient ischemic attack) 05/19/2014   Obesity (BMI 30-39.9) 10/08/2013   OA (osteoarthritis) of knee 07/07/2013   Chronic low back pain 05/09/2013   Obstructive sleep apnea 05/09/2013   GERD (gastroesophageal reflux disease) 05/09/2013   Hiatal hernia 05/09/2013   Fibromyalgia syndrome 05/09/2013   MDD (major depressive disorder), recurrent episode,  moderate (HCC) 05/09/2013   RLS (restless legs syndrome) 05/09/2013   Type 2 diabetes mellitus with vascular disease (HCC) 05/09/2013   Hyperlipidemia associated with type 2 diabetes mellitus (HCC) 05/09/2013   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  Pt accompanied by: self  PERTINENT HISTORY: Per Kaitlin Paich's note, 02/06/24, "Dear Ms. [facility], It was our pleasure to participate in your care. We have typed up brief summary of what we discussed.  1. Unsteady gait - high falls risk  - no signs and symptoms suggestive of injury since 12/03/2023. Strict Emergency Room precautions   2. 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 Headaches - tension type versus hypertensive versus migraine with aura, improving  MRI Brain without contrast + NeuroQuant - 1. No reversible cause for symptoms. Mild chronic small vessel ischemia in the cerebral white matter. 2. Brain atrophy with NeuroQuant volumetric analysis of the brain,  see details on [employer].   Labs - ESR, CRP, CMP 10/17/2023 - satisfactory  4. Leg Length Discrepancy  X-ray hip with pelvis 10/09/2023 - unremarkable   5. Cognitive Impairment  Reports concerns about memory and forgetfulness. At 83 years old, worried about cognitive decline. No history of head injury or neurological symptoms. Agreed to perform bedside memory test and brain scan with NeuroQuant without  contrast to assess brain volume. Discussed the importance of mentally stimulating activities, good sunlight exposure, and dietary changes to improve cognition. - MRI Brain reviewed as above   6. Unintentional Weight Loss, 15 lbs in 3 months - stablized  Lost 15 pounds unintentionally. Denies trouble swallowing, hematuria, melena, chest pain, shortness of breath, lightheadedness, dizziness, neck pain, and lower back pain. Reports daytime sweats for the past six months. Recent lab work and x-ray of pelvis and lower back were satisfactory. Discussed potential causes including thyroid  dysfunction and anemia. Agreed to check thyroid  function, CBC, iron levels, and urine analysis. Referred to gastroenterology and primary care for further evaluation. Encouraged to use meal supplement shakes and monitor weight at home. - Labs reviewed: CBC, CMP, Iron, Ferritin, TSH, Urinalysis, Culture - Labs are okay with minor variations.   - follow-up with primary care provider  - Encourage meal supplement shakes and monitor weight at home Assessment & Plan 7. Recent Fall, without head strike, no new symptoms, no signs and symptoms suggestive of injury  Experienced a fall last night after turning too quickly and losing balance. Reports muscle tightness and soreness but denies chest pain, shortness of breath, head injury, loss of consciousness, or bruising. No new symptoms such as back pain, changes in bowel or bladder control, headaches, nausea, or vomiting. Physical exam revealed no bruising or tenderness in the ribs or lower back. Slight unbalance noted during walking test, but no lightheadedness or dizziness. x-rays of thoracic spine 12/11/2023 - Thoracic compression fracture and lumbar spine - Multilevel degenerative changes. No acute findings   8. Thoracic Compression Fracture  -Per orthopedics. MRIs are pending  9. General Health Maintenance Encouraged to maintain a healthy diet, sleep well, and engage in mentally  stimulating activities. Advised to see ENT or primary care for ear cleaning to address blocked ear and potential equilibrium issues. - Recommend seeing ENT or primary care for ear cleaning to address blocked ear and potential equilibrium issue"  DIAGNOSTIC FINDINGS: MRI brain, 12/14/23, "FINDINGS: Brain: No acute infarction, hemorrhage, hydrocephalus, extra-axial collection or mass lesion. Chronic small vessel ischemic gliosis in the cerebral white matter is mild for age. No chronic blood products or abnormal mineralization.   Vascular: Normal flow voids.   Skull and upper cervical spine: Normal marrow signal.   Sinuses/Orbits: Negative   NeuroQuant Findings:   Volumetric analysis of the brain was performed, with a fully detailed report in [employer]. Briefly, the comparison with age and gender matched reference reveals reasonable tagging cortex. Whole brain volume is low normal. The bilateral hippocampus is less than expected.   IMPRESSION: 1. No reversible cause for symptoms. Mild chronic small vessel ischemia in the cerebral white matter. 2. Brain atrophy with NeuroQuant volumetric analysis of the brain, see details on [facility]."  PAIN:  Are you having pain? No   FALLS: Has patient fallen in last 6 months?  Yes  LIVING ENVIRONMENT: Lives with: lives with their spouse and in independent living apartment at [facility]   PLOF:  Level of assistance: Independent with ADLs Employment: Retired   PATIENT GOALS   for memory and communication to improve  OBJECTIVE:   TODAY'S TREATMENT:  Reviewed education  re: attention including types of attention, compensations to promote attention, and role attention plays in other cognitive-linguistic domains. Reviewed pt's area focus to help improve her attention including focusing on x1 task at at time, clearing away clutter, and taking rest breaks. Pt reports improvement in focusing x1 task at a time and clearing away clutter. Rest  breaks continue to be difficult as pt prefers to work until completion.   Education completed re: types of memory and memory compensations. Pt commented about misplacing keys often and "losing" them in her purse. Discussed finding a bright/large identifier for keys (e.g. key chain, etc). Pt identified prospective memory as an area of focus. Pt noted she sometimes forgets to take night meds and used to have an alarm set for it. Pt tasked with resuming alarm for night meds.   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 Discussed concept of doing one thing at a time, but pt would like to discuss task combination strategies for times when tasks need to be combined for efficiency.  PATIENT EDUCATION: Education details: as above Person educated: Patient Education method: Explanation Education comprehension: verbalized understanding  HOME EXERCISE PROGRAM:        As above    GOALS:  Goals reviewed with patient? Yes  SHORT TERM GOALS: Target date: 10 sessions  With Min A, patient will complete a semantic feature analysis with at least 3 relevant features for 8/10 target words to improve word-finding skills.   Baseline: Goal status: INITIAL   2.  Pt will ID 2-3 strategies for improved attention/memory with min A. Baseline:  Goal status: INITIAL  3.  Pt will report implementation of 2-3 strategies for improved attention and memory.  Baseline:  Goal status: INITIAL  LONG TERM GOALS: Target date: 12 weeks  Pt will utilize compensatory strategies for anomia with modified independence during informal conversational exchanges. Baseline:  Goal status: INITIAL  2.  Pt will demonstrate knowledge of appropriate of appropriate activities to promote cognitive-linguistic functioning outside of therapy. Baseline:  Goal status: INITIAL  3.  Pt will report improved memory per PROM. Baseline: Low - 20 to 29  Memory Satisfaction (T-score: 37) Below Average - 30-39 Memory Ability (T-score: 39) Average - 40 to 60 use of Memory Strategies (T-score: 53) Goal status: INITIAL   ASSESSMENT:  CLINICAL IMPRESSION:  Patient is a 83 y.o. female who was seen today for cognitive-linguistic tx. Pt with reports of worsening short term memory and wordfinding which has been highlighted during recent move to ILF. Pt expresses frustration with CLOF and is highly motivated to improve cognitive-linguistic functioning. Initial assessment was completed via informal means, formal cognitive assessment - Addenbrooke's Cognitive Evaluation (ACE-III), and PROM. Pt presents with mild cognitive-linguistic deficits affecting attention, memory, and verbal fluency. See details of tx session above. Recommend course of ST targeting education and compensations to improve cognitive-linguistic functioning and overall QoL.   OBJECTIVE IMPAIRMENTS include attention, memory, and expressive language. These impairments are limiting patient from effectively communicating at home and in community. Factors affecting potential to achieve goals and functional outcome are ability to learn/carryover information.. Patient will benefit from skilled SLP services to address above impairments and improve overall function.  REHAB POTENTIAL: Good  PLAN: SLP FREQUENCY: 1-2x/week  SLP DURATION: 12 weeks  PLANNED INTERVENTIONS: Cueing hierachy, Cognitive reorganization, Internal/external aids, Functional tasks, Multimodal communication approach, and SLP instruction and feedback   Dia Forget, M.S., CCC-SLP Speech-Language Pathologist [facility] [phone] Rogers Clayman)   [place] [facility] Outpatient Rehabilitation at [facility] [address] [place], [place], [address] Phone: [phone]   Fax:  [phone]

## 2024-03-13 ENCOUNTER — Ambulatory Visit: Admitting: Family Medicine

## 2024-03-13 ENCOUNTER — Encounter: Payer: Self-pay | Admitting: Family Medicine

## 2024-03-13 VITALS — BP 148/72 | HR 59 | Temp 97.7°F | Ht 61.5 in | Wt 124.5 lb

## 2024-03-13 DIAGNOSIS — I152 Hypertension secondary to endocrine disorders: Secondary | ICD-10-CM

## 2024-03-13 DIAGNOSIS — Z7984 Long term (current) use of oral hypoglycemic drugs: Secondary | ICD-10-CM | POA: Diagnosis not present

## 2024-03-13 DIAGNOSIS — N3281 Overactive bladder: Secondary | ICD-10-CM | POA: Diagnosis not present

## 2024-03-13 DIAGNOSIS — E1159 Type 2 diabetes mellitus with other circulatory complications: Secondary | ICD-10-CM | POA: Diagnosis not present

## 2024-03-13 DIAGNOSIS — G4700 Insomnia, unspecified: Secondary | ICD-10-CM | POA: Diagnosis not present

## 2024-03-13 LAB — BASIC METABOLIC PANEL WITH GFR
BUN: 33 mg/dL — ABNORMAL HIGH (ref 6–23)
CO2: 31 meq/L (ref 19–32)
Calcium: 9.8 mg/dL (ref 8.4–10.5)
Chloride: 100 meq/L (ref 96–112)
Creatinine, Ser: 1.03 mg/dL (ref 0.40–1.20)
GFR: 50.83 mL/min — ABNORMAL LOW (ref >60.00)
Glucose, Bld: 158 mg/dL — ABNORMAL HIGH (ref 70–99)
Potassium: 4.1 meq/L (ref 3.5–5.1)
Sodium: 139 meq/L (ref 135–145)

## 2024-03-13 NOTE — Patient Instructions (Addendum)
 Please stop at the lab to have labs drawn.  Consider increasing jardiance  to 25 mg if labs are stable and glucose remains high.  Continue losartan  50 mg daily.

## 2024-03-13 NOTE — Assessment & Plan Note (Signed)
 Chronic, improved control with additional of losartan . Due for BMET recheck  2 weeks after  ARB start.  losartan  50 mg p.o. daily

## 2024-03-13 NOTE — Progress Notes (Signed)
 25 mg daily   Patient ID: Morgan [name], female    DOB: [birth year], 83 y.o.   MRN: [id number]  This visit was conducted in person.  BP (!) 148/72   Pulse (!) 59   Temp 97.7 F (36.5 C) (Oral)   Ht 5' 1.5" (1.562 m)   Wt 124 lb 8 oz (56.5 kg)   SpO2 96%   BMI 23.14 kg/m    CC:  Chief Complaint  Patient presents with   Medical Management of Chronic Issues    Here for 2 wk HTN and insomnia f/u.    Subjective:   HPI: Morgan [name] is a 83 y.o. female presenting on 03/13/2024 for Medical Management of Chronic Issues (Here for 2 wk HTN and insomnia f/u.)   She presnets for 2 week follow up after medicaiton cahnges for HTN, OAB and DM.  Followed by Dr. Mason Sole for recurrent syncope  Reviewed OV note from 11/2022 MRI findings were stable. MRA head and neck negative. EEG did not show any epileptiform discharges.   Cardiac workup, including echocardiogram and Holter monitor, has been completed.   Followed by Dr. Renna [facility].  2025 OV note reviewed.  Paroxsysmal Afib, hx of CAD   Referred to PT at [facility]  for gait instability.  Flowsheet Row Office Visit from 03/13/2024 in [facility]  PHQ-2 Total Score 0      Hypertension:     BP above goal  despite  chlorthalidone , metoprolol . At last OV restarted losartan  50 mg daily BP Readings from Last 3 Encounters:  03/13/24 (!) 148/72  03/03/24 (!) 160/78  02/28/24 (!) 160/78  Using medication without problems or lightheadedness:  none Chest pain with exertion: none Edema: none Short of breath:  occ Average home BPs: not checking Other issues:  Diabetes:     Poor control on metformin   1000 mg BID, off victoza  ( caused GI issues) so at last OV added jardiance .. for kidney protection and DM control. Lab Results  Component Value Date   HGBA1C 8.8 (H) 02/19/2024  Using medications without difficulties: no SE Hypoglycemic episodes: none Hyperglycemic episodes: none Feet problems:  none Blood Sugars averaging: FBS 180 eye exam within last year:  occ. Due for foot exam  Microalbumin positive.. NOW on ACE/ARB  and SGLT2i   Wt Readings from Last 3 Encounters:  03/13/24 124 lb 8 oz (56.5 kg)  03/03/24 126 lb (57.2 kg)  02/28/24 128 lb 3.2 oz (58.2 kg)    Urge incontinence:   Myrbetriq  works better than gemtessa.   MDD, GAD: On venlafaxine  75 mg daily.  At last OV noted  Increased anxiety, trouble sleeping... no longer taking seroquel  at night.... has frequent urination but also mind racing at night from OAB Has re-started mirbegron   Restless leg syndrome: gabapentin  600 mg at night. Using half tablet ropinerole during the day and 2 tablets at bedtime. This is well controlled at night  Relevant past medical, surgical, family and social history reviewed and updated as indicated. Interim medical history since our last visit reviewed. Allergies and medications reviewed and updated. Outpatient Medications Prior to Visit  Medication Sig Dispense Refill   albuterol  (VENTOLIN  HFA) 108 (90 Base) MCG/ACT inhaler Inhale 2 puffs into the lungs as needed for wheezing.     Alum & Mag Hydroxide-Simeth (MAALOX REGULAR STRENGTH PO) Take by mouth daily as needed. Patient takes 1 capful daily of the powder formulation.     aspirin  EC 81 MG tablet  Take 1 tablet (81 mg total) by mouth daily.     atorvastatin  (LIPITOR ) 80 MG tablet TAKE 1 TABLET BY MOUTH EVERY DAY 15 tablet 0   BLINK TEARS 0.25 % SOLN Place 1 drop into both eyes daily as needed (dry/irritated eyes.).     calcium -vitamin D  (OSCAL WITH D) 500-200 MG-UNIT tablet Take 1 tablet by mouth 2 (two) times daily.     chlorthalidone  (HYGROTON ) 25 MG tablet TAKE 1 TABLET BY MOUTH EVERY DAY 90 tablet 3   CINNAMON PO Take 1,000 mg by mouth in the morning and at bedtime.     empagliflozin  (JARDIANCE ) 10 MG TABS tablet Take 1 tablet (10 mg total) by mouth daily before breakfast. 30 tablet 11   ferrous sulfate 325 (65 FE) MG tablet Take  325 mg by mouth daily.     gabapentin  (NEURONTIN ) 300 MG capsule TAKE ONE CAPSULE IN THE MORNING, ONE CAPSULE IN THE EVENING AND 3 CAPSULES AT BEDTIME. 150 capsule 0   Lancet Device MISC Use to check blood sugar twice daily. 1 each 0   Lancets (FREESTYLE) lancets Use to check blood sugar twice daily 100 each 11   losartan  (COZAAR ) 50 MG tablet Take 1 tablet (50 mg total) by mouth daily. 30 tablet 3   metFORMIN  (GLUCOPHAGE -XR) 500 MG 24 hr tablet TAKE 2 TABLETS BY MOUTH TWICE A DAY 360 tablet 3   methylcellulose (CITRUCEL) oral powder Use as directed daily     metoprolol  succinate (TOPROL -XL) 100 MG 24 hr tablet TAKE 1 TABLET BY MOUTH EVERY DAY WITH OR IMMEDIATELY FOLLOWING A MEAL 90 tablet 0   mirabegron  ER (MYRBETRIQ ) 50 MG TB24 tablet Take 1 tablet (50 mg total) by mouth daily. 30 tablet 11   Multiple Vitamins-Minerals (PRESERVISION AREDS PO) Take 1 tablet by mouth 2 (two) times daily. Reported on 12/23/2015     nitroGLYCERIN  (NITROSTAT ) 0.4 MG SL tablet PLACE 1 TABLET (0.4 MG TOTAL) UNDER THE TONGUE EVERY 5 (FIVE) MINUTES AS NEEDED FOR CHEST PAIN 25 tablet 3   ONETOUCH ULTRA test strip USE TO CHECK BLOOD SUGAR TWICE DAILY 150 strip 3   Probiotic Product (ALIGN PO) Take by mouth at bedtime.     QUEtiapine  (SEROQUEL ) 25 MG tablet TAKE 1 TABLET BY MOUTH EVERYDAY AT BEDTIME 90 tablet 1   rOPINIRole  (REQUIP ) 1 MG tablet 1/2 TABLET DURING THE DAY, 2 TABLETS AT BEDTIME 270 tablet 3   venlafaxine  XR (EFFEXOR -XR) 75 MG 24 hr capsule TAKE 1 CAPSULE BY MOUTH DAILY WITH BREAKFAST. 90 capsule 0   vitamin B-12 (CYANOCOBALAMIN ) 1000 MCG tablet Take 1,000 mcg by mouth daily.     No facility-administered medications prior to visit.     Per HPI unless specifically indicated in ROS section below Review of Systems  Constitutional:  Negative for fatigue and fever.  HENT:  Negative for congestion.   Eyes:  Negative for pain.  Respiratory:  Negative for cough and shortness of breath.   Cardiovascular:  Negative  for chest pain, palpitations and leg swelling.  Gastrointestinal:  Negative for abdominal pain.  Genitourinary:  Negative for dysuria and vaginal bleeding.  Musculoskeletal:  Negative for back pain.  Neurological:  Negative for syncope, light-headedness and headaches.  Psychiatric/Behavioral:  Positive for agitation and sleep disturbance. Negative for confusion, decreased concentration, dysphoric mood and suicidal ideas. The patient is nervous/anxious and is hyperactive.    Objective:  BP (!) 148/72   Pulse (!) 59   Temp 97.7 F (36.5 C) (Oral)   Ht  5' 1.5" (1.562 m)   Wt 124 lb 8 oz (56.5 kg)   SpO2 96%   BMI 23.14 kg/m   Wt Readings from Last 3 Encounters:  03/13/24 124 lb 8 oz (56.5 kg)  03/03/24 126 lb (57.2 kg)  02/28/24 128 lb 3.2 oz (58.2 kg)      Physical Exam Vitals and nursing note reviewed.  Constitutional:      General: She is not in acute distress.    Appearance: Normal appearance. She is well-developed. She is not ill-appearing or toxic-appearing.  HENT:     Head: Normocephalic.     Right Ear: Hearing, tympanic membrane, ear canal and external ear normal.     Left Ear: Hearing, tympanic membrane, ear canal and external ear normal.     Nose: Nose normal.  Eyes:     General: Lids are normal. Lids are everted, no foreign bodies appreciated.     Conjunctiva/sclera: Conjunctivae normal.     Pupils: Pupils are equal, round, and reactive to light.  Neck:     Thyroid : No thyroid  mass or thyromegaly.     Vascular: No carotid bruit.     Trachea: Trachea normal.  Cardiovascular:     Rate and Rhythm: Normal rate and regular rhythm.     Heart sounds: Normal heart sounds, S1 normal and S2 normal. No murmur heard.    No gallop.  Pulmonary:     Effort: Pulmonary effort is normal. No respiratory distress.     Breath sounds: Normal breath sounds. No wheezing, rhonchi or rales.  Abdominal:     General: Bowel sounds are normal. There is no distension or abdominal bruit.      Palpations: Abdomen is soft. There is no fluid wave or mass.     Tenderness: There is no abdominal tenderness. There is no guarding or rebound.     Hernia: No hernia is present.  Musculoskeletal:     Cervical back: Normal range of motion and neck supple.  Lymphadenopathy:     Cervical: No cervical adenopathy.  Skin:    General: Skin is warm and dry.     Findings: No rash.  Neurological:     Mental Status: She is alert.     Cranial Nerves: No cranial nerve deficit.     Sensory: No sensory deficit.  Psychiatric:        Mood and Affect: Mood is not anxious or depressed.        Speech: Speech normal.        Behavior: Behavior normal. Behavior is cooperative.        Judgment: Judgment normal.         Results for orders placed or performed in visit on 03/13/24  Basic Metabolic Panel   Collection Time: 03/13/24  9:56 AM  Result Value Ref Range   Sodium 139 135 - 145 mEq/L   Potassium 4.1 3.5 - 5.1 mEq/L   Chloride 100 96 - 112 mEq/L   CO2 31 19 - 32 mEq/L   Glucose, Bld 158 (H) 70 - 99 mg/dL   BUN 33 (H) 6 - 23 mg/dL   Creatinine, Ser 4.09 0.40 - 1.20 mg/dL   GFR 81.19 (L) >14.78 mL/min   Calcium  9.8 8.4 - 10.5 mg/dL   *Note: Due to a large number of results and/or encounters for the requested time period, some results have not been displayed. A complete set of results can be found in Results Review.    This visit occurred during the  SARS-CoV-2 public health emergency.  Safety protocols were in place, including screening questions prior to the visit, additional usage of staff PPE, and extensive cleaning of exam room while observing appropriate contact time as indicated for disinfecting solutions.   COVID 19 screen:  No recent travel or known exposure to COVID19 The patient denies respiratory symptoms of COVID 19 at this time. The importance of social distancing was discussed today.   Assessment and Plan   Problem List Items Addressed This Visit     Hypertension  associated with diabetes (HCC) - Primary (Chronic)   Chronic, improved control with additional of losartan . Due for BMET recheck  2 weeks after  ARB start.  losartan  50 mg p.o. daily      Relevant Orders   Basic Metabolic Panel (Completed)   Insomnia   OAB (overactive bladder)    Chronic, has noted some improvement in control of urination. Still waking some but able to go back to sleep)  Trying to stick to a good sleep routine.      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  NO SE... if no change in renal function and glucose remains elevated... will plan to increase jardiance  to 25 mg daily  Jardiance  10 mg p.o. daily. metformin  XR 500 mg 2 tablets twice daily            Herby Lolling, MD

## 2024-03-13 NOTE — Assessment & Plan Note (Signed)
 Chronic, poor control  In setting of new microalbuminuria now on SGLT2 medication in addition to current metformin .  NO SE... if no change in renal function and glucose remains elevated... will plan to increase jardiance  to 25 mg daily  Jardiance  10 mg p.o. daily. metformin  XR 500 mg 2 tablets twice daily

## 2024-03-13 NOTE — Assessment & Plan Note (Signed)
 Chronic, has noted some improvement in control of urination. Still waking some but able to go back to sleep)  Trying to stick to a good sleep routine.

## 2024-03-14 ENCOUNTER — Telehealth: Payer: Self-pay | Admitting: *Deleted

## 2024-03-14 ENCOUNTER — Other Ambulatory Visit: Payer: Self-pay | Admitting: Family Medicine

## 2024-03-14 NOTE — Progress Notes (Signed)
 Complex Care Management Care Guide Note  03/14/2024 Name: Haillie Premo MRN: [id number] DOB: [birth year]  Eiliana Negroni is a 83 y.o. year old female who is a primary care patient of Bedsole, Amy E, MD and is actively engaged with the care management team. I reached out to [facility] by phone today to assist with re-scheduling  with the Licensed Clinical Child psychotherapist.  Follow up plan: Unsuccessful telephone outreach attempt made. A HIPAA compliant phone message was left for the patient providing contact information and requesting a return call.  Barnie Bora  [facility]  [facility], [facility] Guide  Direct Dial: [phone]  Fax [phone]

## 2024-03-14 NOTE — Progress Notes (Signed)
 Complex Care Management Care Guide Note  03/14/2024 Name: Lynel Lynes MRN: [id number] DOB: [birth year]  Laureli Grondahl is a 83 y.o. year old female who is a primary care patient of Bedsole, Amy E, MD and is actively engaged with the care management team. I reached out to [facility] by phone today to assist with re-scheduling  with the Licensed Clinical Child psychotherapist.  Follow up plan: Telephone appointment with complex care management team member scheduled for:  6/10  Barnie Bora  [facility]  [facility], [facility] Guide  Direct Dial: [phone]  Fax [phone]

## 2024-03-16 ENCOUNTER — Other Ambulatory Visit: Payer: Self-pay | Admitting: Family Medicine

## 2024-03-17 ENCOUNTER — Ambulatory Visit: Attending: Neurology

## 2024-03-17 DIAGNOSIS — R41841 Cognitive communication deficit: Secondary | ICD-10-CM | POA: Insufficient documentation

## 2024-03-17 NOTE — Therapy (Signed)
 OUTPATIENT SPEECH LANGUAGE PATHOLOGY  TREATMENT   Patient Name: Morgan [name] MRN: [id number] DOB:03-May-1941, 83 y.o., female Today's Date: 03/17/2024  PCP: Herby Lolling, MD  REFERRING PROVIDER: Devora Folks, MD   End of Session - 03/17/24 0848     Visit Number 7    Number of Visits 24    Date for SLP Re-Evaluation 05/14/24    SLP Start Time 0848    SLP Stop Time  0930    SLP Time Calculation (min) 42 min    Activity Tolerance Patient tolerated treatment well              Patient Active Problem List   Diagnosis Date Noted   Microalbuminuria due to type 2 diabetes mellitus (HCC) 02/28/2024   Enteritis due to Norovirus 01/18/2024   Elevated lipase 01/18/2024   Acute post-traumatic headache, not intractable 08/14/2023   Bursitis of left hip 07/12/2023   GAD (generalized anxiety disorder) 02/21/2023   Closed nondisplaced fracture of sixth cervical vertebra with routine healing 08/08/2022   Acute renal insufficiency 08/08/2022   Diabetic mononeuropathy associated with type 2 diabetes mellitus (HCC) 05/19/2022   Accidental fall 03/10/2022   Hypomania (HCC) 02/10/2022   Weight loss 02/10/2022   Insomnia 08/16/2021   Iron deficiency anemia 08/16/2021   OAB (overactive bladder) 08/16/2021   Fire ant bite 08/19/2020   Hiatal hernia with GERD 01/21/2020   S/P laparoscopic fundoplication 01/21/2020   Globus sensation    Family history of breast cancer    History of colon polyps    Chronic diarrhea 01/14/2019   Malignant melanoma (HCC) 11/23/2016   Urge incontinence 11/23/2016   Osteopenia 09/15/2014   History of TIA (transient ischemic attack) 05/19/2014   Obesity (BMI 30-39.9) 10/08/2013   OA (osteoarthritis) of knee 07/07/2013   Chronic low back pain 05/09/2013   Obstructive sleep apnea 05/09/2013   GERD (gastroesophageal reflux disease) 05/09/2013   Hiatal hernia 05/09/2013   Fibromyalgia syndrome 05/09/2013   MDD (major depressive disorder), recurrent episode,  moderate (HCC) 05/09/2013   RLS (restless legs syndrome) 05/09/2013   Type 2 diabetes mellitus with vascular disease (HCC) 05/09/2013   Hyperlipidemia associated with type 2 diabetes mellitus (HCC) 05/09/2013   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  Pt accompanied by: self  PERTINENT HISTORY: Per Kaitlin Paich's note, 02/06/24, "Dear Ms. [facility], It was our pleasure to participate in your care. We have typed up brief summary of what we discussed.  1. Unsteady gait - high falls risk  - no signs and symptoms suggestive of injury since 12/03/2023. Strict Emergency Room precautions   2. 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 Headaches - tension type versus hypertensive versus migraine with aura, improving  MRI Brain without contrast + NeuroQuant - 1. No reversible cause for symptoms. Mild chronic small vessel ischemia in the cerebral white matter. 2. Brain atrophy with NeuroQuant volumetric analysis of the brain,  see details on [employer].   Labs - ESR, CRP, CMP 10/17/2023 - satisfactory  4. Leg Length Discrepancy  X-ray hip with pelvis 10/09/2023 - unremarkable   5. Cognitive Impairment  Reports concerns about memory and forgetfulness. At 83 years old, worried about cognitive decline. No history of head injury or neurological symptoms. Agreed to perform bedside memory test and brain scan with NeuroQuant without  contrast to assess brain volume. Discussed the importance of mentally stimulating activities, good sunlight exposure, and dietary changes to improve cognition. - MRI Brain reviewed as above   6. Unintentional Weight Loss, 15 lbs in 3 months - stablized  Lost 15 pounds unintentionally. Denies trouble swallowing, hematuria, melena, chest pain, shortness of breath, lightheadedness, dizziness, neck pain, and lower back pain. Reports daytime sweats for the past six months. Recent lab work and x-ray of pelvis and lower back were satisfactory. Discussed potential causes including thyroid  dysfunction and anemia. Agreed to check thyroid  function, CBC, iron levels, and urine analysis. Referred to gastroenterology and primary care for further evaluation. Encouraged to use meal supplement shakes and monitor weight at home. - Labs reviewed: CBC, CMP, Iron, Ferritin, TSH, Urinalysis, Culture - Labs are okay with minor variations.   - follow-up with primary care provider  - Encourage meal supplement shakes and monitor weight at home Assessment & Plan 7. Recent Fall, without head strike, no new symptoms, no signs and symptoms suggestive of injury  Experienced a fall last night after turning too quickly and losing balance. Reports muscle tightness and soreness but denies chest pain, shortness of breath, head injury, loss of consciousness, or bruising. No new symptoms such as back pain, changes in bowel or bladder control, headaches, nausea, or vomiting. Physical exam revealed no bruising or tenderness in the ribs or lower back. Slight unbalance noted during walking test, but no lightheadedness or dizziness. x-rays of thoracic spine 12/11/2023 - Thoracic compression fracture and lumbar spine - Multilevel degenerative changes. No acute findings   8. Thoracic Compression Fracture  -Per orthopedics. MRIs are pending  9. General Health Maintenance Encouraged to maintain a healthy diet, sleep well, and engage in mentally  stimulating activities. Advised to see ENT or primary care for ear cleaning to address blocked ear and potential equilibrium issues. - Recommend seeing ENT or primary care for ear cleaning to address blocked ear and potential equilibrium issue"  DIAGNOSTIC FINDINGS: MRI brain, 12/14/23, "FINDINGS: Brain: No acute infarction, hemorrhage, hydrocephalus, extra-axial collection or mass lesion. Chronic small vessel ischemic gliosis in the cerebral white matter is mild for age. No chronic blood products or abnormal mineralization.   Vascular: Normal flow voids.   Skull and upper cervical spine: Normal marrow signal.   Sinuses/Orbits: Negative   NeuroQuant Findings:   Volumetric analysis of the brain was performed, with a fully detailed report in [employer]. Briefly, the comparison with age and gender matched reference reveals reasonable tagging cortex. Whole brain volume is low normal. The bilateral hippocampus is less than expected.   IMPRESSION: 1. No reversible cause for symptoms. Mild chronic small vessel ischemia in the cerebral white matter. 2. Brain atrophy with NeuroQuant volumetric analysis of the brain, see details on [employer]."  PAIN:  Are you having pain? No   FALLS: Has patient fallen in last 6 months?  Yes  LIVING ENVIRONMENT: Lives with: lives with their spouse and in independent living apartment at [facility]   PLOF:  Level of assistance: Independent with ADLs Employment: Retired   PATIENT GOALS   for memory and communication to improve  OBJECTIVE:   TODAY'S TREATMENT:  Reviewed pt's  areas of focus to help improve her attention including focusing on x1 task at at time, clearing away clutter, and taking rest breaks. Pt reports improvement in focusing x1 task at a time and clearing away clutter. Rest breaks continue to be difficult as pt prefers to work until completion. Reviewed pt's areas of focus to improve functional memory. Pt found a bright/large  identifier for keys (e.g. key chain, etc). Pt identified prospective memory as an area of difficulty. Pt noted she sometimes forgets to take night meds and used to have an alarm set for it. Pt tasked with resuming alarm for night meds.   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 Introduced factors that impact success during divided attention (e.g. # of tasks, enjoyment of tasks, familiarity of tasks, duration of multitasking). Pt given reflection sheet to complete for HEP targeting multitasking and any potential challenges/solutions.  PATIENT EDUCATION: Education details: as above Person educated: Patient Education method: Explanation Education comprehension: verbalized understanding  HOME EXERCISE PROGRAM:        As above    GOALS:  Goals reviewed with patient? Yes  SHORT TERM GOALS: Target date: 10 sessions  With Min A, patient will complete a semantic feature analysis with at least 3 relevant features for 8/10 target words to improve word-finding skills.   Baseline: Goal status: INITIAL   2.  Pt will ID 2-3 strategies for improved attention/memory with min A. Baseline:  Goal status: INITIAL  3.  Pt will report implementation of 2-3 strategies for improved attention and memory.  Baseline:  Goal status: INITIAL  LONG TERM GOALS: Target date: 12 weeks  Pt will utilize compensatory strategies for anomia with modified independence during informal conversational exchanges. Baseline:  Goal status: INITIAL  2.  Pt will demonstrate knowledge of appropriate of appropriate activities to promote cognitive-linguistic functioning outside of therapy. Baseline:  Goal status: INITIAL  3.  Pt will report improved memory per PROM. Baseline: Low - 20 to 29 Memory Satisfaction (T-score: 37) Below Average - 30-39 Memory Ability (T-score: 39) Average - 40 to 60 use of Memory Strategies (T-score:  53) Goal status: INITIAL   ASSESSMENT:  CLINICAL IMPRESSION:  Patient is a 83 y.o. female who was seen today for cognitive-linguistic tx. Pt with reports of worsening short term memory and wordfinding which has been highlighted during recent move to ILF. Pt expresses frustration with CLOF and is highly motivated to improve cognitive-linguistic functioning. Initial assessment was completed via informal means, formal cognitive assessment - [place]'s Cognitive Evaluation (ACE-III), and PROM. Pt presents with mild cognitive-linguistic deficits affecting attention, memory, and verbal fluency. See details of tx session above. Recommend course of ST targeting education and compensations to improve cognitive-linguistic functioning and overall QoL.   OBJECTIVE IMPAIRMENTS include attention, memory, and expressive language. These impairments are limiting patient from effectively communicating at home and in community. Factors affecting potential to achieve goals and functional outcome are ability to learn/carryover information.. Patient will benefit from skilled SLP services to address above impairments and improve overall function.  REHAB POTENTIAL: Good  PLAN: SLP FREQUENCY: 1-2x/week  SLP DURATION: 12 weeks  PLANNED INTERVENTIONS: Cueing hierachy, Cognitive reorganization, Internal/external aids, Functional tasks, Multimodal communication approach, and SLP instruction and feedback   Dia Forget, M.S., CCC-SLP Speech-Language Pathologist [place] [facility] [phone] [place])   [place] [facility] at [facility] [address] [place], [place],  [address] Phone: [phone]   Fax:  [phone]

## 2024-03-17 NOTE — Telephone Encounter (Signed)
 Last office visit 03/13/24 for HTN/Diabetes  Last refilled 01/24/24 for #150 with no refills.  Next Appt: 06/13/24

## 2024-03-19 ENCOUNTER — Ambulatory Visit

## 2024-03-19 DIAGNOSIS — R41841 Cognitive communication deficit: Secondary | ICD-10-CM

## 2024-03-19 NOTE — Therapy (Signed)
 OUTPATIENT SPEECH LANGUAGE PATHOLOGY  TREATMENT   Patient Name: Morgan [name] MRN: [id number] DOB:Jan 08, 1941, 83 y.o., female Today's Date: 03/19/2024  PCP: Herby Lolling, MD  REFERRING PROVIDER: Devora Folks, MD   End of Session - 03/19/24 0850     Visit Number 8    Number of Visits 24    Date for SLP Re-Evaluation 05/14/24    SLP Start Time 0850    SLP Stop Time  0930    SLP Time Calculation (min) 40 min    Activity Tolerance Patient tolerated treatment well              Patient Active Problem List   Diagnosis Date Noted   Microalbuminuria due to type 2 diabetes mellitus (HCC) 02/28/2024   Enteritis due to Norovirus 01/18/2024   Elevated lipase 01/18/2024   Acute post-traumatic headache, not intractable 08/14/2023   Bursitis of left hip 07/12/2023   GAD (generalized anxiety disorder) 02/21/2023   Closed nondisplaced fracture of sixth cervical vertebra with routine healing 08/08/2022   Acute renal insufficiency 08/08/2022   Diabetic mononeuropathy associated with type 2 diabetes mellitus (HCC) 05/19/2022   Accidental fall 03/10/2022   Hypomania (HCC) 02/10/2022   Weight loss 02/10/2022   Insomnia 08/16/2021   Iron deficiency anemia 08/16/2021   OAB (overactive bladder) 08/16/2021   Fire ant bite 08/19/2020   Hiatal hernia with GERD 01/21/2020   S/P laparoscopic fundoplication 01/21/2020   Globus sensation    Family history of breast cancer    History of colon polyps    Chronic diarrhea 01/14/2019   Malignant melanoma (HCC) 11/23/2016   Urge incontinence 11/23/2016   Osteopenia 09/15/2014   History of TIA (transient ischemic attack) 05/19/2014   Obesity (BMI 30-39.9) 10/08/2013   OA (osteoarthritis) of knee 07/07/2013   Chronic low back pain 05/09/2013   Obstructive sleep apnea 05/09/2013   GERD (gastroesophageal reflux disease) 05/09/2013   Hiatal hernia 05/09/2013   Fibromyalgia syndrome 05/09/2013   MDD (major depressive disorder), recurrent episode,  moderate (HCC) 05/09/2013   RLS (restless legs syndrome) 05/09/2013   Type 2 diabetes mellitus with vascular disease (HCC) 05/09/2013   Hyperlipidemia associated with type 2 diabetes mellitus (HCC) 05/09/2013   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  Pt accompanied by: self  PERTINENT HISTORY: Per Kaitlin Paich's note, 02/06/24, "Dear Ms. [facility], It was our pleasure to participate in your care. We have typed up brief summary of what we discussed.  1. Unsteady gait - high falls risk  - no signs and symptoms suggestive of injury since 12/03/2023. Strict Emergency Room precautions   2. 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 Headaches - tension type versus hypertensive versus migraine with aura, improving  MRI Brain without contrast + NeuroQuant - 1. No reversible cause for symptoms. Mild chronic small vessel ischemia in the cerebral white matter. 2. Brain atrophy with NeuroQuant volumetric analysis of the brain,  see details on [employer].   Labs - ESR, CRP, CMP 10/17/2023 - satisfactory  4. Leg Length Discrepancy  X-ray hip with pelvis 10/09/2023 - unremarkable   5. Cognitive Impairment  Reports concerns about memory and forgetfulness. At 83 years old, worried about cognitive decline. No history of head injury or neurological symptoms. Agreed to perform bedside memory test and brain scan with NeuroQuant without  contrast to assess brain volume. Discussed the importance of mentally stimulating activities, good sunlight exposure, and dietary changes to improve cognition. - MRI Brain reviewed as above   6. Unintentional Weight Loss, 15 lbs in 3 months - stablized  Lost 15 pounds unintentionally. Denies trouble swallowing, hematuria, melena, chest pain, shortness of breath, lightheadedness, dizziness, neck pain, and lower back pain. Reports daytime sweats for the past six months. Recent lab work and x-ray of pelvis and lower back were satisfactory. Discussed potential causes including thyroid  dysfunction and anemia. Agreed to check thyroid  function, CBC, iron levels, and urine analysis. Referred to gastroenterology and primary care for further evaluation. Encouraged to use meal supplement shakes and monitor weight at home. - Labs reviewed: CBC, CMP, Iron, Ferritin, TSH, Urinalysis, Culture - Labs are okay with minor variations.   - follow-up with primary care provider  - Encourage meal supplement shakes and monitor weight at home Assessment & Plan 7. Recent Fall, without head strike, no new symptoms, no signs and symptoms suggestive of injury  Experienced a fall last night after turning too quickly and losing balance. Reports muscle tightness and soreness but denies chest pain, shortness of breath, head injury, loss of consciousness, or bruising. No new symptoms such as back pain, changes in bowel or bladder control, headaches, nausea, or vomiting. Physical exam revealed no bruising or tenderness in the ribs or lower back. Slight unbalance noted during walking test, but no lightheadedness or dizziness. x-rays of thoracic spine 12/11/2023 - Thoracic compression fracture and lumbar spine - Multilevel degenerative changes. No acute findings   8. Thoracic Compression Fracture  -Per orthopedics. MRIs are pending  9. General Health Maintenance Encouraged to maintain a healthy diet, sleep well, and engage in mentally  stimulating activities. Advised to see ENT or primary care for ear cleaning to address blocked ear and potential equilibrium issues. - Recommend seeing ENT or primary care for ear cleaning to address blocked ear and potential equilibrium issue"  DIAGNOSTIC FINDINGS: MRI brain, 12/14/23, "FINDINGS: Brain: No acute infarction, hemorrhage, hydrocephalus, extra-axial collection or mass lesion. Chronic small vessel ischemic gliosis in the cerebral white matter is mild for age. No chronic blood products or abnormal mineralization.   Vascular: Normal flow voids.   Skull and upper cervical spine: Normal marrow signal.   Sinuses/Orbits: Negative   NeuroQuant Findings:   Volumetric analysis of the brain was performed, with a fully detailed report in [employer]. Briefly, the comparison with age and gender matched reference reveals reasonable tagging cortex. Whole brain volume is low normal. The bilateral hippocampus is less than expected.   IMPRESSION: 1. No reversible cause for symptoms. Mild chronic small vessel ischemia in the cerebral white matter. 2. Brain atrophy with NeuroQuant volumetric analysis of the brain, see details on [facility]."  PAIN:  Are you having pain? No   FALLS: Has patient fallen in last 6 months?  Yes  LIVING ENVIRONMENT: Lives with: lives with their spouse and in independent living apartment at [facility]   PLOF:  Level of assistance: Independent with ADLs Employment: Retired   PATIENT GOALS   for memory and communication to improve  OBJECTIVE:   TODAY'S TREATMENT:  Reviewed pt's  areas of focus to help improve her attention including focusing on x1 task at at time, clearing away clutter, and taking rest breaks. Pt reports improvement in focusing x1 task at a time and clearing away clutter. Rest breaks continue to be difficult as pt prefers to work until completion. Reviewed pt's areas of focus to improve functional memory. Pt found a bright/large  identifier for keys (e.g. key chain, etc). Pt identified prospective memory as an area of difficulty. Pt noted she sometimes forgets to take night meds and used to have an alarm set for it, pt reports setting alarm since last session.   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 Reviewed factors that impact success during divided attention (e.g. # of tasks, enjoyment of tasks, familiarity of tasks, duration of multitasking). Reviewed reflection sheet pt completed for HEP. Noted pt with difficulty with pre-planning and task completion - to be addressed in upcoming sessions.   Started task combination worksheet - pt completed x3 scenarios with extra time, intermittent cueing. Pt given for HEP.  PATIENT EDUCATION: Education details: as above Person educated: Patient Education method: Explanation Education comprehension: verbalized understanding  HOME EXERCISE PROGRAM:        As above    GOALS:  Goals reviewed with patient? Yes  SHORT TERM GOALS: Target date: 10 sessions  With Min A, patient will complete a semantic feature analysis with at least 3 relevant features for 8/10 target words to improve word-finding skills.   Baseline: Goal status: INITIAL   2.  Pt will ID 2-3 strategies for improved attention/memory with min A. Baseline:  Goal status: INITIAL  3.  Pt will report implementation of 2-3 strategies for improved attention and memory.  Baseline:  Goal status: INITIAL  LONG TERM GOALS: Target date: 12 weeks  Pt will utilize compensatory strategies for anomia with modified independence during informal conversational exchanges. Baseline:  Goal status: INITIAL  2.  Pt will demonstrate knowledge of appropriate of appropriate activities to promote cognitive-linguistic functioning outside of therapy. Baseline:  Goal status: INITIAL  3.  Pt will report improved memory per PROM. Baseline:  Low - 20 to 29 Memory Satisfaction (T-score: 37) Below Average - 30-39 Memory Ability (T-score: 39) Average - 40 to 60 use of Memory Strategies (T-score: 53) Goal status: INITIAL   ASSESSMENT:  CLINICAL IMPRESSION:  Patient is a 83 y.o. female who was seen today for cognitive-linguistic tx. Pt with reports of worsening short term memory and wordfinding which has been highlighted during recent move to ILF. Pt expresses frustration with CLOF and is highly motivated to improve cognitive-linguistic functioning. Initial assessment was completed via informal means, formal cognitive assessment - Addenbrooke's Cognitive Evaluation (ACE-III), and PROM. Pt presents with mild cognitive-linguistic deficits affecting attention, memory, and verbal fluency. See details of tx session above. Recommend course of ST targeting education and compensations to improve cognitive-linguistic functioning and overall QoL.   OBJECTIVE IMPAIRMENTS include attention, memory, and expressive language. These impairments are limiting patient from effectively communicating at home and in community. Factors affecting potential to achieve goals and functional outcome are ability to learn/carryover information.. Patient will benefit from skilled SLP services to address above impairments and improve overall function.  REHAB POTENTIAL: Good  PLAN: SLP FREQUENCY: 1-2x/week  SLP DURATION: 12 weeks  PLANNED INTERVENTIONS: Cueing hierachy, Cognitive reorganization, Internal/external aids, Functional tasks, Multimodal communication approach, and SLP instruction and feedback   Dia Forget, M.S., CCC-SLP Speech-Language  Pathologist [facility] [phone] [place])   [facility] Rehabilitation at [facility] [address] [place], [place], [address] Phone: [phone]   Fax:  [phone]

## 2024-03-23 ENCOUNTER — Other Ambulatory Visit: Payer: Self-pay | Admitting: Cardiology

## 2024-03-24 ENCOUNTER — Ambulatory Visit

## 2024-03-24 DIAGNOSIS — R41841 Cognitive communication deficit: Secondary | ICD-10-CM | POA: Diagnosis not present

## 2024-03-24 NOTE — Therapy (Signed)
 OUTPATIENT SPEECH LANGUAGE PATHOLOGY  TREATMENT   Patient Name: Morgan [name] MRN: [id number] DOB:12-Sep-1941, 83 y.o., female Today's Date: 03/24/2024  PCP: Herby Lolling, MD  REFERRING PROVIDER: Devora Folks, MD   End of Session - 03/24/24 0934     Visit Number 9    Number of Visits 24    Date for SLP Re-Evaluation 05/14/24    SLP Start Time 0854    SLP Stop Time  0932    SLP Time Calculation (min) 38 min              Patient Active Problem List   Diagnosis Date Noted   Microalbuminuria due to type 2 diabetes mellitus (HCC) 02/28/2024   Enteritis due to Norovirus 01/18/2024   Elevated lipase 01/18/2024   Acute post-traumatic headache, not intractable 08/14/2023   Bursitis of left hip 07/12/2023   GAD (generalized anxiety disorder) 02/21/2023   Closed nondisplaced fracture of sixth cervical vertebra with routine healing 08/08/2022   Acute renal insufficiency 08/08/2022   Diabetic mononeuropathy associated with type 2 diabetes mellitus (HCC) 05/19/2022   Accidental fall 03/10/2022   Hypomania (HCC) 02/10/2022   Weight loss 02/10/2022   Insomnia 08/16/2021   Iron deficiency anemia 08/16/2021   OAB (overactive bladder) 08/16/2021   Fire ant bite 08/19/2020   Hiatal hernia with GERD 01/21/2020   S/P laparoscopic fundoplication 01/21/2020   Globus sensation    Family history of breast cancer    History of colon polyps    Chronic diarrhea 01/14/2019   Malignant melanoma (HCC) 11/23/2016   Urge incontinence 11/23/2016   Osteopenia 09/15/2014   History of TIA (transient ischemic attack) 05/19/2014   Obesity (BMI 30-39.9) 10/08/2013   OA (osteoarthritis) of knee 07/07/2013   Chronic low back pain 05/09/2013   Obstructive sleep apnea 05/09/2013   GERD (gastroesophageal reflux disease) 05/09/2013   Hiatal hernia 05/09/2013   Fibromyalgia syndrome 05/09/2013   MDD (major depressive disorder), recurrent episode, moderate (HCC) 05/09/2013   RLS (restless legs syndrome)  05/09/2013   Type 2 diabetes mellitus with vascular disease (HCC) 05/09/2013   Hyperlipidemia associated with type 2 diabetes mellitus (HCC) 05/09/2013   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  Pt accompanied by: self  PERTINENT HISTORY: Per Kaitlin Paich's note, 02/06/24, "Dear Ms. [facility], It was our pleasure to participate in your care. We have typed up brief summary of what we discussed.  1. Unsteady gait - high falls risk  - no signs and symptoms suggestive of injury since 12/03/2023. Strict Emergency Room precautions   2. 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 Headaches - tension type versus hypertensive versus migraine with aura, improving  MRI Brain without contrast + NeuroQuant - 1. No reversible cause for symptoms. Mild chronic small vessel ischemia in the cerebral white matter. 2. Brain atrophy with NeuroQuant volumetric analysis of the brain,  see details on [employer].   Labs - ESR, CRP, CMP 10/17/2023 - satisfactory   4. Leg Length Discrepancy  X-ray hip with  pelvis 10/09/2023 - unremarkable   5. Cognitive Impairment  Reports concerns about memory and forgetfulness. At 83 years old, worried about cognitive decline. No history of head injury or neurological symptoms. Agreed to perform bedside memory test and brain scan with NeuroQuant without contrast to assess brain volume. Discussed the importance of  mentally stimulating activities, good sunlight exposure, and dietary changes to improve cognition. - MRI Brain reviewed as above   6. Unintentional Weight Loss, 15 lbs in 3 months - stablized  Lost 15 pounds unintentionally. Denies trouble swallowing, hematuria, melena, chest pain, shortness of breath, lightheadedness, dizziness, neck pain, and lower back pain. Reports daytime sweats for the past six months. Recent lab work and x-ray of pelvis and lower back were satisfactory. Discussed potential causes including thyroid  dysfunction and anemia. Agreed to check thyroid  function, CBC, iron levels, and urine analysis. Referred to gastroenterology and primary care for further evaluation. Encouraged to use meal supplement shakes and monitor weight at home. - Labs reviewed: CBC, CMP, Iron, Ferritin, TSH, Urinalysis, Culture - Labs are okay with minor variations.   - follow-up with primary care provider  - Encourage meal supplement shakes and monitor weight at home Assessment & Plan 7. Recent Fall, without head strike, no new symptoms, no signs and symptoms suggestive of injury  Experienced a fall last night after turning too quickly and losing balance. Reports muscle tightness and soreness but denies chest pain, shortness of breath, head injury, loss of consciousness, or bruising. No new symptoms such as back pain, changes in bowel or bladder control, headaches, nausea, or vomiting. Physical exam revealed no bruising or tenderness in the ribs or lower back. Slight unbalance noted during walking test, but no lightheadedness or dizziness. x-rays of thoracic spine 12/11/2023 - Thoracic compression fracture and lumbar spine - Multilevel degenerative changes. No acute findings   8. Thoracic Compression Fracture  -Per orthopedics. MRIs are pending  9. General Health Maintenance Encouraged to maintain a healthy diet, sleep well, and engage in mentally stimulating activities. Advised to see ENT or primary care  for ear cleaning to address blocked ear and potential equilibrium issues. - Recommend seeing ENT or primary care for ear cleaning to address blocked ear and potential equilibrium issue"  DIAGNOSTIC FINDINGS: MRI brain, 12/14/23, "FINDINGS: Brain: No acute infarction, hemorrhage, hydrocephalus, extra-axial collection or mass lesion. Chronic small vessel ischemic gliosis in the cerebral white matter is mild for age. No chronic blood products or abnormal mineralization.   Vascular: Normal flow voids.   Skull and upper cervical spine: Normal marrow signal.   Sinuses/Orbits: Negative   NeuroQuant Findings:   Volumetric analysis of the brain was performed, with a fully detailed report in [employer]. Briefly, the comparison with age and gender matched reference reveals reasonable tagging cortex. Whole brain volume is low normal. The bilateral hippocampus is less than expected.   IMPRESSION: 1. No reversible cause for symptoms. Mild chronic small vessel ischemia in the cerebral white matter. 2. Brain atrophy with NeuroQuant volumetric analysis of the brain, see details on [facility]."  PAIN:  Are you having pain? No   FALLS: Has patient fallen in last 6 months?  Yes  LIVING ENVIRONMENT: Lives with: lives with their spouse and in independent living apartment at [facility]   PLOF:  Level of assistance: Independent with ADLs Employment: Retired   PATIENT GOALS   for memory and communication to improve  OBJECTIVE:   TODAY'S TREATMENT:  Introduced Nurse, children's for efficiency. Pt able to ID  steps in completing various ADLs/iADLs with extra time.   Reviewed SFA. During informal conversational exchanges x1 instance of anomia/paucity of speech with indep repair of breakdown. Pt reports marked improvement in wordfinding.    PATIENT EDUCATION: Education details: as above Person educated: Patient Education method: Explanation Education comprehension: verbalized  understanding  HOME EXERCISE PROGRAM:        Continue use of strategies for anomia and attention/memory    GOALS:  Goals reviewed with patient? Yes  SHORT TERM GOALS: Target date: 10 sessions  With Min A, patient will complete a semantic feature analysis with at least 3 relevant features for 8/10 target words to improve word-finding skills.   Baseline: Goal status: INITIAL   2.  Pt will ID 2-3 strategies for improved attention/memory with min A. Baseline:  Goal status: INITIAL  3.  Pt will report implementation of 2-3 strategies for improved attention and memory.  Baseline:  Goal status: INITIAL  LONG TERM GOALS: Target date: 12 weeks  Pt will utilize compensatory strategies for anomia with modified independence during informal conversational exchanges. Baseline:  Goal status: INITIAL  2.  Pt will demonstrate knowledge of appropriate of appropriate activities to promote cognitive-linguistic functioning outside of therapy. Baseline:  Goal status: INITIAL  3.  Pt will report improved memory per PROM. Baseline: Low - 20 to 29 Memory Satisfaction (T-score: 37) Below Average - 30-39 Memory Ability (T-score: 39) Average - 40 to 60 use of Memory Strategies (T-score: 53) Goal status: INITIAL   ASSESSMENT:  CLINICAL IMPRESSION:  Patient is a 83 y.o. female who was seen today for cognitive-linguistic tx. Pt with reports of worsening short term memory and wordfinding which has been highlighted during recent move to ILF. Pt expresses frustration with CLOF and is highly motivated to improve cognitive-linguistic functioning. Initial assessment was completed via informal means, formal cognitive assessment - Addenbrooke's Cognitive Evaluation (ACE-III), and PROM. Pt presents with mild cognitive-linguistic deficits affecting attention, memory, and verbal fluency. See details of tx session above. Recommend course of ST targeting education and compensations to improve cognitive-linguistic  functioning and overall QoL.   OBJECTIVE IMPAIRMENTS include attention, memory, and expressive language. These impairments are limiting patient from effectively communicating at home and in community. Factors affecting potential to achieve goals and functional outcome are ability to learn/carryover information.. Patient will benefit from skilled SLP services to address above impairments and improve overall function.  REHAB POTENTIAL: Good  PLAN: SLP FREQUENCY: 1-2x/week  SLP DURATION: 12 weeks  PLANNED INTERVENTIONS: Cueing hierachy, Cognitive reorganization, Internal/external aids, Functional tasks, Multimodal communication approach, and SLP instruction and feedback   Dia Forget, M.S., CCC-SLP Speech-Language Pathologist [place] [facility] [phone] [place])   [facility] Outpatient Rehabilitation at [facility] [address] [place], [place], [address] Phone: [phone]   Fax:  [phone]

## 2024-03-26 ENCOUNTER — Ambulatory Visit

## 2024-03-26 DIAGNOSIS — R41841 Cognitive communication deficit: Secondary | ICD-10-CM | POA: Diagnosis not present

## 2024-03-26 NOTE — Therapy (Signed)
 OUTPATIENT SPEECH LANGUAGE PATHOLOGY  TREATMENT  / PROGRESS NOTE   Patient Name: Morgan [name] MRN: [id number] DOB:10-Feb-1941, 83 y.o., female Today's Date: 03/26/2024  Speech Therapy Progress Note  Dates of Reporting Period: 02/20/24 to 03/26/24  Objective: Patient has been seen for 10 speech therapy sessions this reporting period targeting cognitive-linguistic deficits. Patient is making progress toward LTGs and met 3/3 STGs this reporting period. See skilled intervention, clinical impressions, and goals below for details.   PCP: Herby Lolling, MD  REFERRING PROVIDER: Devora Folks, MD   End of Session - 03/26/24 0857     Visit Number 10    Number of Visits 24    Date for SLP Re-Evaluation 05/14/24    SLP Start Time 0857    SLP Stop Time  0930    SLP Time Calculation (min) 33 min    Activity Tolerance Patient tolerated treatment well              Patient Active Problem List   Diagnosis Date Noted   Microalbuminuria due to type 2 diabetes mellitus (HCC) 02/28/2024   Enteritis due to Norovirus 01/18/2024   Elevated lipase 01/18/2024   Acute post-traumatic headache, not intractable 08/14/2023   Bursitis of left hip 07/12/2023   GAD (generalized anxiety disorder) 02/21/2023   Closed nondisplaced fracture of sixth cervical vertebra with routine healing 08/08/2022   Acute renal insufficiency 08/08/2022   Diabetic mononeuropathy associated with type 2 diabetes mellitus (HCC) 05/19/2022   Accidental fall 03/10/2022   Hypomania (HCC) 02/10/2022   Weight loss 02/10/2022   Insomnia 08/16/2021   Iron deficiency anemia 08/16/2021   OAB (overactive bladder) 08/16/2021   Fire ant bite 08/19/2020   Hiatal hernia with GERD 01/21/2020   S/P laparoscopic fundoplication 01/21/2020   Globus sensation    Family history of breast cancer    History of colon polyps    Chronic diarrhea 01/14/2019   Malignant melanoma (HCC) 11/23/2016   Urge incontinence 11/23/2016   Osteopenia  09/15/2014   History of TIA (transient ischemic attack) 05/19/2014   Obesity (BMI 30-39.9) 10/08/2013   OA (osteoarthritis) of knee 07/07/2013   Chronic low back pain 05/09/2013   Obstructive sleep apnea 05/09/2013   GERD (gastroesophageal reflux disease) 05/09/2013   Hiatal hernia 05/09/2013   Fibromyalgia syndrome 05/09/2013   MDD (major depressive disorder), recurrent episode, moderate (HCC) 05/09/2013   RLS (restless legs syndrome) 05/09/2013   Type 2 diabetes mellitus with vascular disease (HCC) 05/09/2013   Hyperlipidemia associated with type 2 diabetes mellitus (HCC) 05/09/2013   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  Pt accompanied by: self  PERTINENT HISTORY: Per Kaitlin Paich's note, 02/06/24, "Dear Ms. [facility], It was our pleasure to participate in your care. We have typed up brief summary of what we discussed.  1. Unsteady gait - high falls risk  - no signs and symptoms suggestive of injury since 12/03/2023. Strict Emergency Room precautions   2. 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 Headaches - tension type  versus hypertensive versus migraine with aura, improving  MRI Brain without contrast + NeuroQuant - 1. No reversible cause for symptoms. Mild chronic small vessel ischemia in the cerebral white matter. 2. Brain atrophy with NeuroQuant volumetric analysis of the brain,  see details on [employer].    Labs - ESR, CRP, CMP 10/17/2023 - satisfactory   4. Leg Length Discrepancy  X-ray hip with pelvis 10/09/2023 - unremarkable   5. Cognitive Impairment  Reports concerns about memory and forgetfulness. At 83 years old, worried about cognitive decline. No history of head injury or neurological symptoms. Agreed to perform bedside memory test and brain scan with NeuroQuant without contrast to assess brain volume. Discussed the importance of mentally stimulating activities, good sunlight exposure, and dietary changes to improve cognition. - MRI Brain reviewed as above   6. Unintentional Weight Loss, 15 lbs in 3 months - stablized  Lost 15 pounds unintentionally. Denies trouble swallowing, hematuria, melena, chest pain, shortness of breath, lightheadedness, dizziness, neck pain, and lower back pain. Reports daytime sweats for the past six months. Recent lab work and x-ray of pelvis and lower back were satisfactory. Discussed potential causes including thyroid  dysfunction and anemia. Agreed to check thyroid  function, CBC, iron levels, and urine analysis. Referred to gastroenterology and primary care for further evaluation. Encouraged to use meal supplement shakes and monitor weight at home. - Labs reviewed: CBC, CMP, Iron, Ferritin, TSH, Urinalysis, Culture - Labs are okay with minor variations.   - follow-up with primary care provider  - Encourage meal supplement shakes and monitor weight at home Assessment & Plan 7. Recent Fall, without head strike, no new symptoms, no signs and symptoms suggestive of injury  Experienced a fall last night after turning too quickly and losing balance. Reports muscle tightness and soreness but denies chest pain, shortness of breath, head injury, loss of consciousness, or bruising. No new symptoms such as back pain, changes in bowel or bladder control, headaches, nausea, or vomiting. Physical exam revealed no bruising or tenderness in the ribs or lower back. Slight  unbalance noted during walking test, but no lightheadedness or dizziness. x-rays of thoracic spine 12/11/2023 - Thoracic compression fracture and lumbar spine - Multilevel degenerative changes. No acute findings   8. Thoracic Compression Fracture  -Per orthopedics. MRIs are pending  9. General Health Maintenance Encouraged to maintain a healthy diet, sleep well, and engage in mentally stimulating activities. Advised to see ENT or primary care for ear cleaning to address blocked ear and potential equilibrium issues. - Recommend seeing ENT or primary care for ear cleaning to address blocked ear and potential equilibrium issue"  DIAGNOSTIC FINDINGS: MRI brain, 12/14/23, "FINDINGS: Brain: No acute infarction, hemorrhage, hydrocephalus, extra-axial collection or mass lesion. Chronic small vessel ischemic gliosis in the cerebral white matter is mild for age. No chronic blood products or abnormal mineralization.   Vascular: Normal flow voids.   Skull and upper cervical spine: Normal marrow signal.   Sinuses/Orbits: Negative   NeuroQuant Findings:   Volumetric analysis of the brain was performed, with a fully detailed report in [employer]. Briefly, the comparison with age and gender matched reference reveals reasonable tagging cortex. Whole brain volume is low normal. The bilateral hippocampus is less than expected.   IMPRESSION: 1. No reversible cause for symptoms. Mild chronic small vessel ischemia in the cerebral white matter. 2. Brain atrophy with NeuroQuant volumetric analysis of the brain, see details on [facility]."  PAIN:  Are you having pain? No   FALLS:  Has patient fallen in last 6 months?  Yes  LIVING ENVIRONMENT: Lives with: lives with their spouse and in independent living apartment at [facility]   PLOF:  Level of assistance: Independent with ADLs Employment: Retired   PATIENT GOALS   for memory and communication to improve  OBJECTIVE:   TODAY'S TREATMENT:   Introduced the concepts of cognitive load, caregiver stress, and caregiver burn out. Discussed the demands of caregiving and potential impacts on cognition (via changes to sleep, stress, overall mental health). Pt noted several s/sx of both caregiver stress and burn out.   PATIENT EDUCATION: Education details: as above Person educated: Patient Education method: Explanation Education comprehension: verbalized understanding  HOME EXERCISE PROGRAM:        Continue use of strategies for anomia and attention/memory    GOALS:  Goals reviewed with patient? Yes  SHORT TERM GOALS: Target date: 10 sessions  With Min A, patient will complete a semantic feature analysis with at least 3 relevant features for 8/10 target words to improve word-finding skills.   Baseline: Goal status: MET   2.  Pt will ID 2-3 strategies for improved attention/memory with min A. Baseline:  Goal status: MET  3.  Pt will report implementation of 2-3 strategies for improved attention and memory.  Baseline:  Goal status: MET  LONG TERM GOALS: Target date: 12 weeks  Pt will utilize compensatory strategies for anomia with modified independence during informal conversational exchanges. Baseline:  Goal status: MET  2.  Pt will demonstrate knowledge of appropriate of appropriate activities to promote cognitive-linguistic functioning outside of therapy. Baseline:  Goal status: PROGRESSING  3.  Pt will report improved memory per PROM. Baseline: Low - 20 to 29 Memory Satisfaction (T-score: 37) Below Average - 30-39 Memory Ability (T-score: 39) Average - 40 to 60 use of Memory Strategies (T-score: 53) Goal status: PROGRESSING   ASSESSMENT:  CLINICAL IMPRESSION:  Patient is a 83 y.o. female who was seen today for cognitive-linguistic tx. Pt with reports of worsening short term memory and wordfinding which has been highlighted during recent move to ILF. Pt expresses frustration with CLOF and is highly motivated  to improve cognitive-linguistic functioning. Initial assessment was completed via informal means, formal cognitive assessment - Addenbrooke's Cognitive Evaluation (ACE-III), and PROM. Pt presents with mild cognitive-linguistic deficits affecting attention, memory, and verbal fluency. See details of tx session above. Recommend course of ST targeting education and compensations to improve cognitive-linguistic functioning and overall QoL.   OBJECTIVE IMPAIRMENTS include attention, memory, and expressive language. These impairments are limiting patient from effectively communicating at home and in community. Factors affecting potential to achieve goals and functional outcome are ability to learn/carryover information.. Patient will benefit from skilled SLP services to address above impairments and improve overall function.  REHAB POTENTIAL: Good  PLAN: SLP FREQUENCY: 1-2x/week  SLP DURATION: 12 weeks  PLANNED INTERVENTIONS: Cueing hierachy, Cognitive reorganization, Internal/external aids, Functional tasks, Multimodal communication approach, and SLP instruction and feedback   Dia Forget, M.S., CCC-SLP Speech-Language Pathologist [facility] [phone] [place] Clayman)    [facility] Out[facility] at [facility] [address] [place], [place], [address] Phone: [phone]   Fax:  [phone]

## 2024-03-31 ENCOUNTER — Ambulatory Visit

## 2024-03-31 DIAGNOSIS — R41841 Cognitive communication deficit: Secondary | ICD-10-CM | POA: Diagnosis not present

## 2024-03-31 NOTE — Therapy (Signed)
 OUTPATIENT SPEECH LANGUAGE PATHOLOGY  TREATMENT    Patient Name: Morgan [name] MRN: [id number] DOB:1941/07/26, 83 y.o., female Today's Date: 03/31/2024     PCP: Herby Lolling, MD  REFERRING PROVIDER: Devora Folks, MD   End of Session - 03/31/24 1230     Visit Number 11    Number of Visits 24    Date for SLP Re-Evaluation 05/14/24    SLP Start Time 1015    SLP Stop Time  1100    SLP Time Calculation (min) 45 min    Activity Tolerance Patient tolerated treatment well              Patient Active Problem List   Diagnosis Date Noted   Microalbuminuria due to type 2 diabetes mellitus (HCC) 02/28/2024   Enteritis due to Norovirus 01/18/2024   Elevated lipase 01/18/2024   Acute post-traumatic headache, not intractable 08/14/2023   Bursitis of left hip 07/12/2023   GAD (generalized anxiety disorder) 02/21/2023   Closed nondisplaced fracture of sixth cervical vertebra with routine healing 08/08/2022   Acute renal insufficiency 08/08/2022   Diabetic mononeuropathy associated with type 2 diabetes mellitus (HCC) 05/19/2022   Accidental fall 03/10/2022   Hypomania (HCC) 02/10/2022   Weight loss 02/10/2022   Insomnia 08/16/2021   Iron deficiency anemia 08/16/2021   OAB (overactive bladder) 08/16/2021   Fire ant bite 08/19/2020   Hiatal hernia with GERD 01/21/2020   S/P laparoscopic fundoplication 01/21/2020   Globus sensation    Family history of breast cancer    History of colon polyps    Chronic diarrhea 01/14/2019   Malignant melanoma (HCC) 11/23/2016   Urge incontinence 11/23/2016   Osteopenia 09/15/2014   History of TIA (transient ischemic attack) 05/19/2014   Obesity (BMI 30-39.9) 10/08/2013   OA (osteoarthritis) of knee 07/07/2013   Chronic low back pain 05/09/2013   Obstructive sleep apnea 05/09/2013   GERD (gastroesophageal reflux disease) 05/09/2013   Hiatal hernia 05/09/2013   Fibromyalgia syndrome 05/09/2013   MDD (major depressive disorder), recurrent  episode, moderate (HCC) 05/09/2013   RLS (restless legs syndrome) 05/09/2013   Type 2 diabetes mellitus with vascular disease (HCC) 05/09/2013   Hyperlipidemia associated with type 2 diabetes mellitus (HCC) 05/09/2013   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  Pt accompanied by: self  PERTINENT HISTORY: Per Kaitlin Paich's note, 02/06/24, "Dear Ms. [facility], It was our pleasure to participate in your care. We have typed up brief summary of what we discussed.  1. Unsteady gait - high falls risk  - no signs and symptoms suggestive of injury since 12/03/2023. Strict Emergency Room precautions   2. 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 Headaches - tension type versus hypertensive versus migraine with aura, improving  MRI Brain without contrast + NeuroQuant - 1. No reversible cause for symptoms. Mild chronic small vessel ischemia in the cerebral white matter. 2. Brain atrophy with NeuroQuant volumetric analysis of the brain,  see details on [employer].   Labs - ESR, CRP, CMP  10/17/2023 - satisfactory   4. Leg Length Discrepancy  X-ray hip with pelvis 10/09/2023 - unremarkable   5. Cognitive Impairment  Reports concerns about memory and forgetfulness. At 83 years old, worried about cognitive decline. No history of head injury or neurological symptoms. Agreed to perform bedside memory test and brain scan with NeuroQuant  without contrast to assess brain volume. Discussed the importance of mentally stimulating activities, good sunlight exposure, and dietary changes to improve cognition. - MRI Brain reviewed as above   6. Unintentional Weight Loss, 15 lbs in 3 months - stablized  Lost 15 pounds unintentionally. Denies trouble swallowing, hematuria, melena, chest pain, shortness of breath, lightheadedness, dizziness, neck pain, and lower back pain. Reports daytime sweats for the past six months. Recent lab work and x-ray of pelvis and lower back were satisfactory. Discussed potential causes including thyroid  dysfunction and anemia. Agreed to check thyroid  function, CBC, iron levels, and urine analysis. Referred to gastroenterology and primary care for further evaluation. Encouraged to use meal supplement shakes and monitor weight at home. - Labs reviewed: CBC, CMP, Iron, Ferritin, TSH, Urinalysis, Culture - Labs are okay with minor variations.   - follow-up with primary care provider  - Encourage meal supplement shakes and monitor weight at home Assessment & Plan 7. Recent Fall, without head strike, no new symptoms, no signs and symptoms suggestive of injury  Experienced a fall last night after turning too quickly and losing balance. Reports muscle tightness and soreness but denies chest pain, shortness of breath, head injury, loss of consciousness, or bruising. No new symptoms such as back pain, changes in bowel or bladder control, headaches, nausea, or vomiting. Physical exam revealed no bruising or tenderness in the ribs or lower back. Slight unbalance noted during walking test, but no lightheadedness or dizziness. x-rays of thoracic spine 12/11/2023 - Thoracic compression fracture and lumbar spine - Multilevel degenerative changes. No acute findings   8. Thoracic Compression Fracture  -Per orthopedics. MRIs are pending  9. General Health Maintenance Encouraged to maintain a healthy diet, sleep well, and engage in  mentally stimulating activities. Advised to see ENT or primary care for ear cleaning to address blocked ear and potential equilibrium issues. - Recommend seeing ENT or primary care for ear cleaning to address blocked ear and potential equilibrium issue"  DIAGNOSTIC FINDINGS: MRI brain, 12/14/23, "FINDINGS: Brain: No acute infarction, hemorrhage, hydrocephalus, extra-axial collection or mass lesion. Chronic small vessel ischemic gliosis in the cerebral white matter is mild for age. No chronic blood products or abnormal mineralization.   Vascular: Normal flow voids.   Skull and upper cervical spine: Normal marrow signal.   Sinuses/Orbits: Negative   NeuroQuant Findings:   Volumetric analysis of the brain was performed, with a fully detailed report in [employer]. Briefly, the comparison with age and gender matched reference reveals reasonable tagging cortex. Whole brain volume is low normal. The bilateral hippocampus is less than expected.   IMPRESSION: 1. No reversible cause for symptoms. Mild chronic small vessel ischemia in the cerebral white matter. 2. Brain atrophy with NeuroQuant volumetric analysis of the brain, see details on [facility]."  PAIN:  Are you having pain? No   FALLS: Has patient fallen in last 6 months?  Yes  LIVING ENVIRONMENT: Lives with: lives with their spouse and in independent living apartment at [facility]   PLOF:  Level of assistance: Independent with ADLs Employment: Retired   PATIENT GOALS   for memory and communication to improve  OBJECTIVE:  TODAY'S TREATMENT:  Introduced concept of cognitive deloading/unloading. Handout reviewed with various strategies to offload tasks, information, and decisions to external sources or strategies. Pt identified delegation and use of voice assistants functional strategies she could utilize.    PATIENT EDUCATION: Education details: as above Person educated: Patient Education method:  Explanation Education comprehension: verbalized understanding  HOME EXERCISE PROGRAM:        Cognitive deloading strategies    GOALS:  Goals reviewed with patient? Yes  SHORT TERM GOALS: Target date: 10 sessions  With Min A, patient will complete a semantic feature analysis with at least 3 relevant features for 8/10 target words to improve word-finding skills.   Baseline: Goal status: MET   2.  Pt will ID 2-3 strategies for improved attention/memory with min A. Baseline:  Goal status: MET  3.  Pt will report implementation of 2-3 strategies for improved attention and memory.  Baseline:  Goal status: MET  LONG TERM GOALS: Target date: 12 weeks  Pt will utilize compensatory strategies for anomia with modified independence during informal conversational exchanges. Baseline:  Goal status: MET  2.  Pt will demonstrate knowledge of appropriate of appropriate activities to promote cognitive-linguistic functioning outside of therapy. Baseline:  Goal status: PROGRESSING  3.  Pt will report improved memory per PROM. Baseline: Low - 20 to 29 Memory Satisfaction (T-score: 37) Below Average - 30-39 Memory Ability (T-score: 39) Average - 40 to 60 use of Memory Strategies (T-score: 53) Goal status: PROGRESSING   ASSESSMENT:  CLINICAL IMPRESSION:  Patient is a 83 y.o. female who was seen today for cognitive-linguistic tx. Pt with reports of worsening short term memory and wordfinding which has been highlighted during recent move to ILF. Pt expresses frustration with CLOF and is highly motivated to improve cognitive-linguistic functioning. Initial assessment was completed via informal means, formal cognitive assessment - Addenbrooke's Cognitive Evaluation (ACE-III), and PROM. Pt presents with mild cognitive-linguistic deficits affecting attention, memory, and verbal fluency. See details of tx session above. Recommend course of ST targeting education and compensations to improve  cognitive-linguistic functioning and overall QoL.   OBJECTIVE IMPAIRMENTS include attention, memory, and expressive language. These impairments are limiting patient from effectively communicating at home and in community. Factors affecting potential to achieve goals and functional outcome are ability to learn/carryover information.. Patient will benefit from skilled SLP services to address above impairments and improve overall function.  REHAB POTENTIAL: Good  PLAN: SLP FREQUENCY: 1-2x/week  SLP DURATION: 12 weeks  PLANNED INTERVENTIONS: Cueing hierachy, Cognitive reorganization, Internal/external aids, Functional tasks, Multimodal communication approach, and SLP instruction and feedback   Dia Forget, M.S., CCC-SLP Speech-Language Pathologist [facility] [phone] [place] Clayman)   [facility] Outpatient Rehabilitation at [facility] [address] [place], [place], [address] Phone: [phone]   Fax:  [phone]

## 2024-04-02 ENCOUNTER — Ambulatory Visit

## 2024-04-02 DIAGNOSIS — R41841 Cognitive communication deficit: Secondary | ICD-10-CM | POA: Diagnosis not present

## 2024-04-02 NOTE — Therapy (Signed)
 OUTPATIENT SPEECH LANGUAGE PATHOLOGY  TREATMENT    Patient Name: Morgan [name] MRN: [id number] DOB:1941/08/08, 83 y.o., female Today's Date: 04/02/2024     PCP: Herby Lolling, MD  REFERRING PROVIDER: Devora Folks, MD   End of Session - 04/02/24 0852     Visit Number 12    Number of Visits 24    Date for SLP Re-Evaluation 05/14/24    SLP Start Time 0852    SLP Stop Time  0930    SLP Time Calculation (min) 38 min    Activity Tolerance Patient tolerated treatment well              Patient Active Problem List   Diagnosis Date Noted   Microalbuminuria due to type 2 diabetes mellitus (HCC) 02/28/2024   Enteritis due to Norovirus 01/18/2024   Elevated lipase 01/18/2024   Acute post-traumatic headache, not intractable 08/14/2023   Bursitis of left hip 07/12/2023   GAD (generalized anxiety disorder) 02/21/2023   Closed nondisplaced fracture of sixth cervical vertebra with routine healing 08/08/2022   Acute renal insufficiency 08/08/2022   Diabetic mononeuropathy associated with type 2 diabetes mellitus (HCC) 05/19/2022   Accidental fall 03/10/2022   Hypomania (HCC) 02/10/2022   Weight loss 02/10/2022   Insomnia 08/16/2021   Iron deficiency anemia 08/16/2021   OAB (overactive bladder) 08/16/2021   Fire ant bite 08/19/2020   Hiatal hernia with GERD 01/21/2020   S/P laparoscopic fundoplication 01/21/2020   Globus sensation    Family history of breast cancer    History of colon polyps    Chronic diarrhea 01/14/2019   Malignant melanoma (HCC) 11/23/2016   Urge incontinence 11/23/2016   Osteopenia 09/15/2014   History of TIA (transient ischemic attack) 05/19/2014   Obesity (BMI 30-39.9) 10/08/2013   OA (osteoarthritis) of knee 07/07/2013   Chronic low back pain 05/09/2013   Obstructive sleep apnea 05/09/2013   GERD (gastroesophageal reflux disease) 05/09/2013   Hiatal hernia 05/09/2013   Fibromyalgia syndrome 05/09/2013   MDD (major depressive disorder), recurrent  episode, moderate (HCC) 05/09/2013   RLS (restless legs syndrome) 05/09/2013   Type 2 diabetes mellitus with vascular disease (HCC) 05/09/2013   Hyperlipidemia associated with type 2 diabetes mellitus (HCC) 05/09/2013   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  Pt accompanied by: self  PERTINENT HISTORY: Per Kaitlin Paich's note, 02/06/24, "Dear Ms. [facility], It was our pleasure to participate in your care. We have typed up brief summary of what we discussed.  1. Unsteady gait - high falls risk  - no signs and symptoms suggestive of injury since 12/03/2023. Strict Emergency Room precautions   2. 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 Headaches - tension type versus hypertensive versus migraine with aura, improving  MRI Brain without contrast + NeuroQuant - 1. No reversible cause for symptoms. Mild chronic small vessel ischemia in the cerebral white matter. 2. Brain atrophy with NeuroQuant volumetric analysis of the brain,  see details on [employer].   Labs - ESR, CRP, CMP  10/17/2023 - satisfactory   4. Leg Length Discrepancy  X-ray hip with pelvis 10/09/2023 - unremarkable   5. Cognitive Impairment  Reports concerns about memory and forgetfulness. At 83 years old, worried about cognitive decline. No history of head injury or neurological symptoms. Agreed to perform bedside memory test and brain scan with NeuroQuant  without contrast to assess brain volume. Discussed the importance of mentally stimulating activities, good sunlight exposure, and dietary changes to improve cognition. - MRI Brain reviewed as above   6. Unintentional Weight Loss, 15 lbs in 3 months - stablized  Lost 15 pounds unintentionally. Denies trouble swallowing, hematuria, melena, chest pain, shortness of breath, lightheadedness, dizziness, neck pain, and lower back pain. Reports daytime sweats for the past six months. Recent lab work and x-ray of pelvis and lower back were satisfactory. Discussed potential causes including thyroid  dysfunction and anemia. Agreed to check thyroid  function, CBC, iron levels, and urine analysis. Referred to gastroenterology and primary care for further evaluation. Encouraged to use meal supplement shakes and monitor weight at home. - Labs reviewed: CBC, CMP, Iron, Ferritin, TSH, Urinalysis, Culture - Labs are okay with minor variations.   - follow-up with primary care provider  - Encourage meal supplement shakes and monitor weight at home Assessment & Plan 7. Recent Fall, without head strike, no new symptoms, no signs and symptoms suggestive of injury  Experienced a fall last night after turning too quickly and losing balance. Reports muscle tightness and soreness but denies chest pain, shortness of breath, head injury, loss of consciousness, or bruising. No new symptoms such as back pain, changes in bowel or bladder control, headaches, nausea, or vomiting. Physical exam revealed no bruising or tenderness in the ribs or lower back. Slight unbalance noted during walking test, but no lightheadedness or dizziness. x-rays of thoracic spine 12/11/2023 - Thoracic compression fracture and lumbar spine - Multilevel degenerative changes. No acute findings   8. Thoracic Compression Fracture  -Per orthopedics. MRIs are pending  9. General Health Maintenance Encouraged to maintain a healthy diet, sleep well, and engage in  mentally stimulating activities. Advised to see ENT or primary care for ear cleaning to address blocked ear and potential equilibrium issues. - Recommend seeing ENT or primary care for ear cleaning to address blocked ear and potential equilibrium issue"  DIAGNOSTIC FINDINGS: MRI brain, 12/14/23, "FINDINGS: Brain: No acute infarction, hemorrhage, hydrocephalus, extra-axial collection or mass lesion. Chronic small vessel ischemic gliosis in the cerebral white matter is mild for age. No chronic blood products or abnormal mineralization.   Vascular: Normal flow voids.   Skull and upper cervical spine: Normal marrow signal.   Sinuses/Orbits: Negative   NeuroQuant Findings:   Volumetric analysis of the brain was performed, with a fully detailed report in [employer]. Briefly, the comparison with age and gender matched reference reveals reasonable tagging cortex. Whole brain volume is low normal. The bilateral hippocampus is less than expected.   IMPRESSION: 1. No reversible cause for symptoms. Mild chronic small vessel ischemia in the cerebral white matter. 2. Brain atrophy with NeuroQuant volumetric analysis of the brain, see details on [employer]."  PAIN:  Are you having pain? No   FALLS: Has patient fallen in last 6 months?  Yes  LIVING ENVIRONMENT: Lives with: lives with their spouse and in independent living apartment at [facility]   PLOF:  Level of assistance: Independent with ADLs Employment: Retired   PATIENT GOALS   for memory and communication to improve  OBJECTIVE:  TODAY'S TREATMENT:  The HVLT-R is a list learning test, which consists of 12 nouns within three semantic groups. The test has three learning trials in which the administrator reads the words aloud and then asks the patient to repeat as many as he/she can remember in any order. The three learning trials are used to calculate a Total Recall Score and Recognition Discrimination Index (RDI).  Immediate  Recall: Trial 1: 4 (mean 5.8 / SD: 1.9)   Trial 2: 7 (mean 8.3 / SD: 2) Trial 3: 8 (mean 9.4 / SD: 2) Total Recall Score:  19 (mean 23.4/ SD 5.2)  Delayed Recognition True positive responses: 10  False-positive errors: 0  Recognition Discrimination Index (RDI): 10 (mean 11)  Pt's performance was slightly below the average range.   Based on performance on HVLT, reviewed internal memory strategies such as repetition and association.   PATIENT EDUCATION: Education details: as above Person educated: Patient Education method: Explanation Education comprehension: verbalized understanding  HOME EXERCISE PROGRAM:        Cognitive deloading strategies    GOALS:  Goals reviewed with patient? Yes  SHORT TERM GOALS: Target date: 10 sessions  With Min A, patient will complete a semantic feature analysis with at least 3 relevant features for 8/10 target words to improve word-finding skills.   Baseline: Goal status: MET   2.  Pt will ID 2-3 strategies for improved attention/memory with min A. Baseline:  Goal status: MET  3.  Pt will report implementation of 2-3 strategies for improved attention and memory.  Baseline:  Goal status: MET  LONG TERM GOALS: Target date: 12 weeks  Pt will utilize compensatory strategies for anomia with modified independence during informal conversational exchanges. Baseline:  Goal status: MET  2.  Pt will demonstrate knowledge of appropriate of appropriate activities to promote cognitive-linguistic functioning outside of therapy. Baseline:  Goal status: PROGRESSING  3.  Pt will report improved memory per PROM. Baseline: Low - 20 to 29 Memory Satisfaction (T-score: 37) Below Average - 30-39 Memory Ability (T-score: 39) Average - 40 to 60 use of Memory Strategies (T-score: 53) Goal status: PROGRESSING   ASSESSMENT:  CLINICAL IMPRESSION:  Patient is a 83 y.o. female who was seen today for cognitive-linguistic tx. Pt with reports of worsening  short term memory and wordfinding which has been highlighted during recent move to ILF. Pt expresses frustration with CLOF and is highly motivated to improve cognitive-linguistic functioning. Initial assessment was completed via informal means, formal cognitive assessment - Addenbrooke's Cognitive Evaluation (ACE-III), and PROM. Pt presents with mild cognitive-linguistic deficits affecting attention, memory, and verbal fluency. See details of tx session above. Recommend course of ST targeting education and compensations to improve cognitive-linguistic functioning and overall QoL.   OBJECTIVE IMPAIRMENTS include attention, memory, and expressive language. These impairments are limiting patient from effectively communicating at home and in community. Factors affecting potential to achieve goals and functional outcome are ability to learn/carryover information.. Patient will benefit from skilled SLP services to address above impairments and improve overall function.  REHAB POTENTIAL: Good  PLAN: SLP FREQUENCY: 1-2x/week  SLP DURATION: 12 weeks  PLANNED INTERVENTIONS: Cueing hierachy, Cognitive reorganization, Internal/external aids, Functional tasks, Multimodal communication approach, and SLP instruction and feedback   Dia Forget, M.S., CCC-SLP Speech-Language Pathologist [facility] [phone] [place] Clayman)   [facility] Outpatient Rehabilitation at [facility] [address] [place], [place], [address] Phone: [phone]   Fax:  [phone]

## 2024-04-08 ENCOUNTER — Ambulatory Visit

## 2024-04-10 ENCOUNTER — Ambulatory Visit

## 2024-04-14 ENCOUNTER — Ambulatory Visit: Attending: Neurology

## 2024-04-14 DIAGNOSIS — R41841 Cognitive communication deficit: Secondary | ICD-10-CM | POA: Insufficient documentation

## 2024-04-14 NOTE — Therapy (Signed)
 OUTPATIENT SPEECH LANGUAGE PATHOLOGY  TREATMENT    Patient Name: Morgan [name] MRN: [id number] DOB:[birth year], 83 y.o., female Today's Date: 04/14/2024     PCP: Herby Lolling, MD  REFERRING PROVIDER: Devora Folks, MD   End of Session - 04/14/24 0937     Visit Number 13    Number of Visits 24    Date for SLP Re-Evaluation 05/14/24    SLP Start Time 0850    SLP Stop Time  0935    SLP Time Calculation (min) 45 min    Activity Tolerance Patient tolerated treatment well              Patient Active Problem List   Diagnosis Date Noted   Microalbuminuria due to type 2 diabetes mellitus (HCC) 02/28/2024   Enteritis due to Norovirus 01/18/2024   Elevated lipase 01/18/2024   Acute post-traumatic headache, not intractable 08/14/2023   Bursitis of left hip 07/12/2023   GAD (generalized anxiety disorder) 02/21/2023   Closed nondisplaced fracture of sixth cervical vertebra with routine healing 08/08/2022   Acute renal insufficiency 08/08/2022   Diabetic mononeuropathy associated with type 2 diabetes mellitus (HCC) 05/19/2022   Accidental fall 03/10/2022   Hypomania (HCC) 02/10/2022   Weight loss 02/10/2022   Insomnia 08/16/2021   Iron deficiency anemia 08/16/2021   OAB (overactive bladder) 08/16/2021   Fire ant bite 08/19/2020   Hiatal hernia with GERD 01/21/2020   S/P laparoscopic fundoplication 01/21/2020   Globus sensation    Family history of breast cancer    History of colon polyps    Chronic diarrhea 01/14/2019   Malignant melanoma (HCC) 11/23/2016   Urge incontinence 11/23/2016   Osteopenia 09/15/2014   History of TIA (transient ischemic attack) 05/19/2014   Obesity (BMI 30-39.9) 10/08/2013   OA (osteoarthritis) of knee 07/07/2013   Chronic low back pain 05/09/2013   Obstructive sleep apnea 05/09/2013   GERD (gastroesophageal reflux disease) 05/09/2013   Hiatal hernia 05/09/2013   Fibromyalgia syndrome 05/09/2013   MDD (major depressive disorder), recurrent  episode, moderate (HCC) 05/09/2013   RLS (restless legs syndrome) 05/09/2013   Type 2 diabetes mellitus with vascular disease (HCC) 05/09/2013   Hyperlipidemia associated with type 2 diabetes mellitus (HCC) 05/09/2013   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  Pt accompanied by: self  PERTINENT HISTORY: Per Kaitlin Paich's note, 02/06/24, "Dear Ms. [facility], It was our pleasure to participate in your care. We have typed up brief summary of what we discussed.  1. Unsteady gait - high falls risk  - no signs and symptoms suggestive of injury since 12/03/2023. Strict Emergency Room precautions   2. 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 Headaches - tension type versus hypertensive versus migraine with aura, improving  MRI Brain without contrast + NeuroQuant - 1. No reversible cause for symptoms. Mild chronic small vessel ischemia in the cerebral white matter. 2. Brain atrophy with NeuroQuant volumetric analysis of the brain,  see details on [employer].   Labs - ESR, CRP, CMP  10/17/2023 - satisfactory   4. Leg Length Discrepancy  X-ray hip with pelvis 10/09/2023 - unremarkable   5. Cognitive Impairment  Reports concerns about memory and forgetfulness. At 83 years old, worried about cognitive decline. No history of head injury or neurological symptoms. Agreed to perform bedside memory test and brain scan with NeuroQuant  without contrast to assess brain volume. Discussed the importance of mentally stimulating activities, good sunlight exposure, and dietary changes to improve cognition. - MRI Brain reviewed as above   6. Unintentional Weight Loss, 15 lbs in 3 months - stablized  Lost 15 pounds unintentionally. Denies trouble swallowing, hematuria, melena, chest pain, shortness of breath, lightheadedness, dizziness, neck pain, and lower back pain. Reports daytime sweats for the past six months. Recent lab work and x-ray of pelvis and lower back were satisfactory. Discussed potential causes including thyroid  dysfunction and anemia. Agreed to check thyroid  function, CBC, iron levels, and urine analysis. Referred to gastroenterology and primary care for further evaluation. Encouraged to use meal supplement shakes and monitor weight at home. - Labs reviewed: CBC, CMP, Iron, Ferritin, TSH, Urinalysis, Culture - Labs are okay with minor variations.   - follow-up with primary care provider  - Encourage meal supplement shakes and monitor weight at home Assessment & Plan 7. Recent Fall, without head strike, no new symptoms, no signs and symptoms suggestive of injury  Experienced a fall last night after turning too quickly and losing balance. Reports muscle tightness and soreness but denies chest pain, shortness of breath, head injury, loss of consciousness, or bruising. No new symptoms such as back pain, changes in bowel or bladder control, headaches, nausea, or vomiting. Physical exam revealed no bruising or tenderness in the ribs or lower back. Slight unbalance noted during walking test, but no lightheadedness or dizziness. x-rays of thoracic spine 12/11/2023 - Thoracic compression fracture and lumbar spine - Multilevel degenerative changes. No acute findings   8. Thoracic Compression Fracture  -Per orthopedics. MRIs are pending  9. General Health Maintenance Encouraged to maintain a healthy diet, sleep well, and engage in  mentally stimulating activities. Advised to see ENT or primary care for ear cleaning to address blocked ear and potential equilibrium issues. - Recommend seeing ENT or primary care for ear cleaning to address blocked ear and potential equilibrium issue"  DIAGNOSTIC FINDINGS: MRI brain, 12/14/23, "FINDINGS: Brain: No acute infarction, hemorrhage, hydrocephalus, extra-axial collection or mass lesion. Chronic small vessel ischemic gliosis in the cerebral white matter is mild for age. No chronic blood products or abnormal mineralization.   Vascular: Normal flow voids.   Skull and upper cervical spine: Normal marrow signal.   Sinuses/Orbits: Negative   NeuroQuant Findings:   Volumetric analysis of the brain was performed, with a fully detailed report in [employer]. Briefly, the comparison with age and gender matched reference reveals reasonable tagging cortex. Whole brain volume is low normal. The bilateral hippocampus is less than expected.   IMPRESSION: 1. No reversible cause for symptoms. Mild chronic small vessel ischemia in the cerebral white matter. 2. Brain atrophy with NeuroQuant volumetric analysis of the brain, see details on [employer]."  PAIN:  Are you having pain? No   FALLS: Has patient fallen in last 6 months?  Yes  LIVING ENVIRONMENT: Lives with: lives with their spouse and in independent living apartment at [facility]   PLOF:  Level of assistance: Independent with ADLs Employment: Retired   PATIENT GOALS   for memory and communication to improve  OBJECTIVE:  TODAY'S TREATMENT:  Discussed pt's successes and opportunities for improvement for use of cognitive-communication strategies during recent vacation. Pt noted successful planning, packing, and driving during lengthy car trip to [place]. Pt endorsed few instances of anomia for specific names of people in conversation with indep repair. Pt stated that speech therapy is making her "feel more  confident."  PATIENT EDUCATION: Education details: as above Person educated: Patient Education method: Explanation Education comprehension: verbalized understanding  HOME EXERCISE PROGRAM:        Cognitive deloading strategies    GOALS:  Goals reviewed with patient? Yes  SHORT TERM GOALS: Target date: 10 sessions  With Min A, patient will complete a semantic feature analysis with at least 3 relevant features for 8/10 target words to improve word-finding skills.   Baseline: Goal status: MET   2.  Pt will ID 2-3 strategies for improved attention/memory with min A. Baseline:  Goal status: MET  3.  Pt will report implementation of 2-3 strategies for improved attention and memory.  Baseline:  Goal status: MET  LONG TERM GOALS: Target date: 12 weeks  Pt will utilize compensatory strategies for anomia with modified independence during informal conversational exchanges. Baseline:  Goal status: MET  2.  Pt will demonstrate knowledge of appropriate of appropriate activities to promote cognitive-linguistic functioning outside of therapy. Baseline:  Goal status: PROGRESSING  3.  Pt will report improved memory per PROM. Baseline: Low - 20 to 29 Memory Satisfaction (T-score: 37) Below Average - 30-39 Memory Ability (T-score: 39) Average - 40 to 60 use of Memory Strategies (T-score: 53) Goal status: PROGRESSING   ASSESSMENT:  CLINICAL IMPRESSION:  Patient is a 83 y.o. female who was seen today for cognitive-linguistic tx. Pt with reports of worsening short term memory and wordfinding which has been highlighted during recent move to ILF. Pt expresses frustration with CLOF and is highly motivated to improve cognitive-linguistic functioning. Initial assessment was completed via informal means, formal cognitive assessment - Addenbrooke's Cognitive Evaluation (ACE-III), and PROM. Pt presents with mild cognitive-linguistic deficits affecting attention, memory, and verbal fluency. See  details of tx session above. Recommend course of ST targeting education and compensations to improve cognitive-linguistic functioning and overall QoL.   OBJECTIVE IMPAIRMENTS include attention, memory, and expressive language. These impairments are limiting patient from effectively communicating at home and in community. Factors affecting potential to achieve goals and functional outcome are ability to learn/carryover information.. Patient will benefit from skilled SLP services to address above impairments and improve overall function.  REHAB POTENTIAL: Good  PLAN: SLP FREQUENCY: 1-2x/week  SLP DURATION: 12 weeks  PLANNED INTERVENTIONS: Cueing hierachy, Cognitive reorganization, Internal/external aids, Functional tasks, Multimodal communication approach, and SLP instruction and feedback   Dia Forget, M.S., CCC-SLP Speech-Language Pathologist [facility] [phone] [place] Clayman)   [facility] Outpatient Rehabilitation at [facility] [address] [place], [place], [address] Phone: [phone]   Fax:  [phone]

## 2024-04-15 ENCOUNTER — Other Ambulatory Visit: Payer: Self-pay | Admitting: Family Medicine

## 2024-04-15 ENCOUNTER — Ambulatory Visit: Payer: Self-pay

## 2024-04-15 ENCOUNTER — Telehealth: Payer: Self-pay | Admitting: *Deleted

## 2024-04-15 DIAGNOSIS — N3281 Overactive bladder: Secondary | ICD-10-CM

## 2024-04-15 NOTE — Telephone Encounter (Signed)
 Copied from CRM [id number]. Topic: Referral - Request for Referral >> Apr 15, 2024 11:39 AM [place] A wrote: Did the patient discuss referral with their provider in the last year? Yes (If No - schedule appointment) (If Yes - send message)  Appointment offered? No  Type of order/referral and detailed reason for visit: Urologist  Preference of office, provider, location: [facility] Urology [phone]  If referral order, have you been seen by this specialty before? Yes (If Yes, this issue or another issue? When? Where?  Can we respond through MyChart? Yes

## 2024-04-15 NOTE — Telephone Encounter (Signed)
 Patient has requested referral to [facility] Urogynecology at [facility].   Referral linked to OAB.  Please sign referral below if appropriate.

## 2024-04-15 NOTE — Telephone Encounter (Signed)
 Chief Complaint: medication issue Disposition: [] ED /[] Urgent Care (no appt availability in office) / [] Appointment(In office/virtual)/ []  [facility]/ [] Home Care/ [] Refused Recommended Disposition /[]  Mobile Bus/ [x]  Follow-up with PCP  Additional Notes: Pt states that she was given Jardiance  to try for 2 weeks, states she tried it one week and it made her dizziness and lethargic and caused double vision. States that her last dose was almost 2 weeks ago. States the symptoms went away after stopping the medication. Pt states she is currently just taking the metformin  and BG is running in the 200's.  States this morning BG was 238.  Pt is questioning if she should try the Jardiance  at bedtime instead of in morning or if she needs a different medication.    Copied from CRM [id number]. Topic: Clinical - Red Word Triage >> Apr 15, 2024 11:45 AM Corin V wrote: [facility] that prompted transfer to Nurse Triage: Patient stopped her Jardiance  because it was making her dizzy and woozy. She didn't know if she wanted to know if Dr. Cherlyn Cornet wanted her to try taking it at night or try and alternate medication. Symptoms stopped when she stopped taking the medication and it wore off. Blood pressure today is 188/78. Sugar count is 238. Reason for Disposition  [1] Caller has URGENT medicine question about med that PCP or specialist prescribed AND [2] triager unable to answer question  Answer Assessment - Initial Assessment Questions 1. NAME of MEDICINE: "What medicine(s) are you calling about?"     Jardiance  2. QUESTION: "What is your question?" (e.g., double dose of medicine, side effect)     Side effects 3. PRESCRIBER: "Who prescribed the medicine?" Reason: if prescribed by specialist, call should be referred to that group.     Bedsole 4. SYMPTOMS: "Do you have any symptoms?" If Yes, ask: "What symptoms are you having?"  "How bad are the symptoms (e.g., mild, moderate, severe)     Dizziness  and fatigue  Protocols used: Medication Question Call-A-AH

## 2024-04-16 ENCOUNTER — Ambulatory Visit

## 2024-04-16 ENCOUNTER — Telehealth: Payer: Self-pay

## 2024-04-16 DIAGNOSIS — R41841 Cognitive communication deficit: Secondary | ICD-10-CM

## 2024-04-16 NOTE — Telephone Encounter (Signed)
 Have her try jardiance  at bedtime. If still SE have her make and appt to discuss other DM options.

## 2024-04-16 NOTE — Telephone Encounter (Signed)
 Left message for Morgan [name] to return call to office.

## 2024-04-16 NOTE — Therapy (Signed)
 OUTPATIENT SPEECH LANGUAGE PATHOLOGY  TREATMENT    Patient Name: Morgan [name] MRN: [id number] DOB:[birth year], 83 y.o., female Today's Date: 04/16/2024     PCP: Herby Lolling, MD  REFERRING PROVIDER: Devora Folks, MD   End of Session - 04/16/24 1407     Visit Number 14    Number of Visits 24    Date for SLP Re-Evaluation 05/14/24    SLP Start Time 1322    SLP Stop Time  1405    SLP Time Calculation (min) 43 min    Activity Tolerance Patient tolerated treatment well              Patient Active Problem List   Diagnosis Date Noted   Microalbuminuria due to type 2 diabetes mellitus (HCC) 02/28/2024   Enteritis due to Norovirus 01/18/2024   Elevated lipase 01/18/2024   Acute post-traumatic headache, not intractable 08/14/2023   Bursitis of left hip 07/12/2023   GAD (generalized anxiety disorder) 02/21/2023   Closed nondisplaced fracture of sixth cervical vertebra with routine healing 08/08/2022   Acute renal insufficiency 08/08/2022   Diabetic mononeuropathy associated with type 2 diabetes mellitus (HCC) 05/19/2022   Accidental fall 03/10/2022   Hypomania (HCC) 02/10/2022   Weight loss 02/10/2022   Insomnia 08/16/2021   Iron deficiency anemia 08/16/2021   OAB (overactive bladder) 08/16/2021   Fire ant bite 08/19/2020   Hiatal hernia with GERD 01/21/2020   S/P laparoscopic fundoplication 01/21/2020   Globus sensation    Family history of breast cancer    History of colon polyps    Chronic diarrhea 01/14/2019   Malignant melanoma (HCC) 11/23/2016   Urge incontinence 11/23/2016   Osteopenia 09/15/2014   History of TIA (transient ischemic attack) 05/19/2014   Obesity (BMI 30-39.9) 10/08/2013   OA (osteoarthritis) of knee 07/07/2013   Chronic low back pain 05/09/2013   Obstructive sleep apnea 05/09/2013   GERD (gastroesophageal reflux disease) 05/09/2013   Hiatal hernia 05/09/2013   Fibromyalgia syndrome 05/09/2013   MDD (major depressive disorder), recurrent  episode, moderate (HCC) 05/09/2013   RLS (restless legs syndrome) 05/09/2013   Type 2 diabetes mellitus with vascular disease (HCC) 05/09/2013   Hyperlipidemia associated with type 2 diabetes mellitus (HCC) 05/09/2013   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  Pt accompanied by: self  PERTINENT HISTORY: Per Kaitlin Paich's note, 02/06/24, "Dear Ms. [facility], It was our pleasure to participate in your care. We have typed up brief summary of what we discussed.  1. Unsteady gait - high falls risk  - no signs and symptoms suggestive of injury since 12/03/2023. Strict Emergency Room precautions   2. 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 Headaches - tension type versus hypertensive versus migraine with aura, improving  MRI Brain without contrast + NeuroQuant - 1. No reversible cause for symptoms. Mild chronic small vessel ischemia in the cerebral white matter. 2. Brain atrophy with NeuroQuant volumetric analysis of the brain,  see details on [employer].   Labs - ESR, CRP, CMP  10/17/2023 - satisfactory   4. Leg Length Discrepancy  X-ray hip with pelvis 10/09/2023 - unremarkable   5. Cognitive Impairment  Reports concerns about memory and forgetfulness. At 83 years old, worried about cognitive decline. No history of head injury or neurological symptoms. Agreed to perform bedside memory test and brain scan with NeuroQuant  without contrast to assess brain volume. Discussed the importance of mentally stimulating activities, good sunlight exposure, and dietary changes to improve cognition. - MRI Brain reviewed as above   6. Unintentional Weight Loss, 15 lbs in 3 months - stablized  Lost 15 pounds unintentionally. Denies trouble swallowing, hematuria, melena, chest pain, shortness of breath, lightheadedness, dizziness, neck pain, and lower back pain. Reports daytime sweats for the past six months. Recent lab work and x-ray of pelvis and lower back were satisfactory. Discussed potential causes including thyroid  dysfunction and anemia. Agreed to check thyroid  function, CBC, iron levels, and urine analysis. Referred to gastroenterology and primary care for further evaluation. Encouraged to use meal supplement shakes and monitor weight at home. - Labs reviewed: CBC, CMP, Iron, Ferritin, TSH, Urinalysis, Culture - Labs are okay with minor variations.   - follow-up with primary care provider  - Encourage meal supplement shakes and monitor weight at home Assessment & Plan 7. Recent Fall, without head strike, no new symptoms, no signs and symptoms suggestive of injury  Experienced a fall last night after turning too quickly and losing balance. Reports muscle tightness and soreness but denies chest pain, shortness of breath, head injury, loss of consciousness, or bruising. No new symptoms such as back pain, changes in bowel or bladder control, headaches, nausea, or vomiting. Physical exam revealed no bruising or tenderness in the ribs or lower back. Slight unbalance noted during walking test, but no lightheadedness or dizziness. x-rays of thoracic spine 12/11/2023 - Thoracic compression fracture and lumbar spine - Multilevel degenerative changes. No acute findings   8. Thoracic Compression Fracture  -Per orthopedics. MRIs are pending  9. General Health Maintenance Encouraged to maintain a healthy diet, sleep well, and engage in  mentally stimulating activities. Advised to see ENT or primary care for ear cleaning to address blocked ear and potential equilibrium issues. - Recommend seeing ENT or primary care for ear cleaning to address blocked ear and potential equilibrium issue"  DIAGNOSTIC FINDINGS: MRI brain, 12/14/23, "FINDINGS: Brain: No acute infarction, hemorrhage, hydrocephalus, extra-axial collection or mass lesion. Chronic small vessel ischemic gliosis in the cerebral white matter is mild for age. No chronic blood products or abnormal mineralization.   Vascular: Normal flow voids.   Skull and upper cervical spine: Normal marrow signal.   Sinuses/Orbits: Negative   NeuroQuant Findings:   Volumetric analysis of the brain was performed, with a fully detailed report in [employer]. Briefly, the comparison with age and gender matched reference reveals reasonable tagging cortex. Whole brain volume is low normal. The bilateral hippocampus is less than expected.   IMPRESSION: 1. No reversible cause for symptoms. Mild chronic small vessel ischemia in the cerebral white matter. 2. Brain atrophy with NeuroQuant volumetric analysis of the brain, see details on [employer]."  PAIN:  Are you having pain? No   FALLS: Has patient fallen in last 6 months?  Yes  LIVING ENVIRONMENT: Lives with: lives with their spouse and in independent living apartment at [facility]   PLOF:  Level of assistance: Independent with ADLs Employment: Retired   PATIENT GOALS   for memory and communication to improve  OBJECTIVE:  TODAY'S TREATMENT:  Discussed pt's successes and opportunities for improvement for use of cognitive-communication strategies during recent social events. Pt reports improved ability to recall new people's names, recall pertinent information, and "find words." Pt attributes this to skills and "confidence" gained in ST. Supportive counseling provided re: progress to date and  cognitive-communication.  PATIENT EDUCATION: Education details: as above Person educated: Patient Education method: Explanation Education comprehension: verbalized understanding  HOME EXERCISE PROGRAM:        Cognitive deloading strategies    GOALS:  Goals reviewed with patient? Yes  SHORT TERM GOALS: Target date: 10 sessions  With Min A, patient will complete a semantic feature analysis with at least 3 relevant features for 8/10 target words to improve word-finding skills.   Baseline: Goal status: MET   2.  Pt will ID 2-3 strategies for improved attention/memory with min A. Baseline:  Goal status: MET  3.  Pt will report implementation of 2-3 strategies for improved attention and memory.  Baseline:  Goal status: MET  LONG TERM GOALS: Target date: 12 weeks  Pt will utilize compensatory strategies for anomia with modified independence during informal conversational exchanges. Baseline:  Goal status: MET  2.  Pt will demonstrate knowledge of appropriate of appropriate activities to promote cognitive-linguistic functioning outside of therapy. Baseline:  Goal status: PROGRESSING  3.  Pt will report improved memory per PROM. Baseline: Low - 20 to 29 Memory Satisfaction (T-score: 37) Below Average - 30-39 Memory Ability (T-score: 39) Average - 40 to 60 use of Memory Strategies (T-score: 53) Goal status: PROGRESSING   ASSESSMENT:  CLINICAL IMPRESSION:  Patient is a 83 y.o. female who was seen today for cognitive-linguistic tx. Pt with reports of worsening short term memory and wordfinding which has been highlighted during recent move to ILF. Pt expresses frustration with CLOF and is highly motivated to improve cognitive-linguistic functioning. Initial assessment was completed via informal means, formal cognitive assessment - [place]'s Cognitive Evaluation (ACE-III), and PROM. Pt presents with mild cognitive-linguistic deficits affecting attention, memory, and verbal  fluency. See details of tx session above. Recommend course of ST targeting education and compensations to improve cognitive-linguistic functioning and overall QoL.   OBJECTIVE IMPAIRMENTS include attention, memory, and expressive language. These impairments are limiting patient from effectively communicating at home and in community. Factors affecting potential to achieve goals and functional outcome are ability to learn/carryover information.. Patient will benefit from skilled SLP services to address above impairments and improve overall function.  REHAB POTENTIAL: Good  PLAN: SLP FREQUENCY: 1-2x/week  SLP DURATION: 12 weeks  PLANNED INTERVENTIONS: Cueing hierachy, Cognitive reorganization, Internal/external aids, Functional tasks, Multimodal communication approach, and SLP instruction and feedback   Dia Forget, M.S., CCC-SLP Speech-Language Pathologist [facility] [phone] [place])   [facility] at [facility] [address] [place], [place], [address] Phone: [phone]   Fax:  [phone]

## 2024-04-16 NOTE — Telephone Encounter (Signed)
 Pt "no showed" for today's appointment. Left voicemail on pt's cell number. Pt's next scheduled appointment is Monday, 6/9, at 0800.   Dia Forget, M.S., CCC-SLP Speech-Language Pathologist [facility] [phone] (ASCOM)

## 2024-04-16 NOTE — Telephone Encounter (Addendum)
 Morgan [name] notified as instructed by telephone.  She will try taking at night and will call to schedule an appointment, if she continues having side effects,to discuss other treatment options.

## 2024-04-21 ENCOUNTER — Ambulatory Visit

## 2024-04-21 DIAGNOSIS — R41841 Cognitive communication deficit: Secondary | ICD-10-CM | POA: Diagnosis not present

## 2024-04-21 NOTE — Therapy (Signed)
 OUTPATIENT SPEECH LANGUAGE PATHOLOGY  TREATMENT    Patient Name: Morgan [name] MRN: [id number] DOB:05/08/1941, 83 y.o., female Today's Date: 04/21/2024     PCP: Herby Lolling, MD  REFERRING PROVIDER: Devora Folks, MD   End of Session - 04/21/24 0808     Visit Number 15    Number of Visits 24    Date for SLP Re-Evaluation 05/14/24    SLP Start Time 0803    SLP Stop Time  0850    SLP Time Calculation (min) 47 min    Activity Tolerance Patient tolerated treatment well              Patient Active Problem List   Diagnosis Date Noted   Microalbuminuria due to type 2 diabetes mellitus (HCC) 02/28/2024   Enteritis due to Norovirus 01/18/2024   Elevated lipase 01/18/2024   Acute post-traumatic headache, not intractable 08/14/2023   Bursitis of left hip 07/12/2023   GAD (generalized anxiety disorder) 02/21/2023   Closed nondisplaced fracture of sixth cervical vertebra with routine healing 08/08/2022   Acute renal insufficiency 08/08/2022   Diabetic mononeuropathy associated with type 2 diabetes mellitus (HCC) 05/19/2022   Accidental fall 03/10/2022   Hypomania (HCC) 02/10/2022   Weight loss 02/10/2022   Insomnia 08/16/2021   Iron deficiency anemia 08/16/2021   OAB (overactive bladder) 08/16/2021   Fire ant bite 08/19/2020   Hiatal hernia with GERD 01/21/2020   S/P laparoscopic fundoplication 01/21/2020   Globus sensation    Family history of breast cancer    History of colon polyps    Chronic diarrhea 01/14/2019   Malignant melanoma (HCC) 11/23/2016   Urge incontinence 11/23/2016   Osteopenia 09/15/2014   History of TIA (transient ischemic attack) 05/19/2014   Obesity (BMI 30-39.9) 10/08/2013   OA (osteoarthritis) of knee 07/07/2013   Chronic low back pain 05/09/2013   Obstructive sleep apnea 05/09/2013   GERD (gastroesophageal reflux disease) 05/09/2013   Hiatal hernia 05/09/2013   Fibromyalgia syndrome 05/09/2013   MDD (major depressive disorder), recurrent  episode, moderate (HCC) 05/09/2013   RLS (restless legs syndrome) 05/09/2013   Type 2 diabetes mellitus with vascular disease (HCC) 05/09/2013   Hyperlipidemia associated with type 2 diabetes mellitus (HCC) 05/09/2013   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  Pt accompanied by: self  PERTINENT HISTORY: Per Kaitlin Paich's note, 02/06/24, "Dear Ms. [facility], It was our pleasure to participate in your care. We have typed up brief summary of what we discussed.  1. Unsteady gait - high falls risk  - no signs and symptoms suggestive of injury since 12/03/2023. Strict Emergency Room precautions   2. 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 Headaches - tension type versus hypertensive versus migraine with aura, improving  MRI Brain without contrast + NeuroQuant - 1. No reversible cause for symptoms. Mild chronic small vessel ischemia in the cerebral white matter. 2. Brain atrophy with NeuroQuant volumetric analysis of the brain,  see details on [employer].   Labs - ESR, CRP, CMP  10/17/2023 - satisfactory   4. Leg Length Discrepancy  X-ray hip with pelvis 10/09/2023 - unremarkable   5. Cognitive Impairment  Reports concerns about memory and forgetfulness. At 83 years old, worried about cognitive decline. No history of head injury or neurological symptoms. Agreed to perform bedside memory test and brain scan with NeuroQuant  without contrast to assess brain volume. Discussed the importance of mentally stimulating activities, good sunlight exposure, and dietary changes to improve cognition. - MRI Brain reviewed as above   6. Unintentional Weight Loss, 15 lbs in 3 months - stablized  Lost 15 pounds unintentionally. Denies trouble swallowing, hematuria, melena, chest pain, shortness of breath, lightheadedness, dizziness, neck pain, and lower back pain. Reports daytime sweats for the past six months. Recent lab work and x-ray of pelvis and lower back were satisfactory. Discussed potential causes including thyroid  dysfunction and anemia. Agreed to check thyroid  function, CBC, iron levels, and urine analysis. Referred to gastroenterology and primary care for further evaluation. Encouraged to use meal supplement shakes and monitor weight at home. - Labs reviewed: CBC, CMP, Iron, Ferritin, TSH, Urinalysis, Culture - Labs are okay with minor variations.   - follow-up with primary care provider  - Encourage meal supplement shakes and monitor weight at home Assessment & Plan 7. Recent Fall, without head strike, no new symptoms, no signs and symptoms suggestive of injury  Experienced a fall last night after turning too quickly and losing balance. Reports muscle tightness and soreness but denies chest pain, shortness of breath, head injury, loss of consciousness, or bruising. No new symptoms such as back pain, changes in bowel or bladder control, headaches, nausea, or vomiting. Physical exam revealed no bruising or tenderness in the ribs or lower back. Slight unbalance noted during walking test, but no lightheadedness or dizziness. x-rays of thoracic spine 12/11/2023 - Thoracic compression fracture and lumbar spine - Multilevel degenerative changes. No acute findings   8. Thoracic Compression Fracture  -Per orthopedics. MRIs are pending  9. General Health Maintenance Encouraged to maintain a healthy diet, sleep well, and engage in  mentally stimulating activities. Advised to see ENT or primary care for ear cleaning to address blocked ear and potential equilibrium issues. - Recommend seeing ENT or primary care for ear cleaning to address blocked ear and potential equilibrium issue"  DIAGNOSTIC FINDINGS: MRI brain, 12/14/23, "FINDINGS: Brain: No acute infarction, hemorrhage, hydrocephalus, extra-axial collection or mass lesion. Chronic small vessel ischemic gliosis in the cerebral white matter is mild for age. No chronic blood products or abnormal mineralization.   Vascular: Normal flow voids.   Skull and upper cervical spine: Normal marrow signal.   Sinuses/Orbits: Negative   NeuroQuant Findings:   Volumetric analysis of the brain was performed, with a fully detailed report in [employer]. Briefly, the comparison with age and gender matched reference reveals reasonable tagging cortex. Whole brain volume is low normal. The bilateral hippocampus is less than expected.   IMPRESSION: 1. No reversible cause for symptoms. Mild chronic small vessel ischemia in the cerebral white matter. 2. Brain atrophy with NeuroQuant volumetric analysis of the brain, see details on [employer]."  PAIN:  Are you having pain? No   FALLS: Has patient fallen in last 6 months?  Yes  LIVING ENVIRONMENT: Lives with: lives with their spouse and in independent living apartment at [facility]   PLOF:  Level of assistance: Independent with ADLs Employment: Retired   PATIENT GOALS   for memory and communication to improve  OBJECTIVE:  TODAY'S TREATMENT:  Repeated PROM with results as follow:  MULTIFACTORIAL MEMORY QUESTIONNAIRE (MMQ)  Administered patient self-reported outcome measure Multifactorial Memory Questionnaire (MMQ). The Multifactorial Memory Questionnaire [facility]) consists of three scales measuring separate aspects of metamemory; Satisfaction, Ability and Strategy.   Pt's responses are converted to T-Scores with  severity levels based on pt's T-Score.   Severity Levels (T-score) Very Low - < 20 Low - 20 to 29 Below Average - 30-39 Average - 40 to 60 Above Average - 60 to 70 High - 71 to 80 Very High - > 80  Pt reports:  Below Average - 30-39 Memory Satisfaction (T-score: 39) Average - 40-60 Memory Ability (T-score: 51) Average - 40 to 60 use of Memory Strategies (T-score: 56)  Reviewed results, progress to date, and SLP POC. Plan to focus on pre-planning strategies as well as discussion re: normal aging.    PATIENT EDUCATION: Education details: as above Person educated: Patient Education method: Explanation Education comprehension: verbalized understanding  HOME EXERCISE PROGRAM:        Continue implementation of recommended strategies    GOALS:  Goals reviewed with patient? Yes  SHORT TERM GOALS: Target date: 10 sessions  With Min A, patient will complete a semantic feature analysis with at least 3 relevant features for 8/10 target words to improve word-finding skills.   Baseline: Goal status: MET   2.  Pt will ID 2-3 strategies for improved attention/memory with min A. Baseline:  Goal status: MET  3.  Pt will report implementation of 2-3 strategies for improved attention and memory.  Baseline:  Goal status: MET  LONG TERM GOALS: Target date: 12 weeks  Pt will utilize compensatory strategies for anomia with modified independence during informal conversational exchanges. Baseline:  Goal status: MET  2.  Pt will demonstrate knowledge of appropriate of appropriate activities to promote cognitive-linguistic functioning outside of therapy. Baseline:  Goal status: PROGRESSING  3.  Pt will report improved memory per PROM. Baseline: Low - 20 to 29 Memory Satisfaction (T-score: 37) Below Average - 30-39 Memory Ability (T-score: 39) Average - 40 to 60 use of Memory Strategies (T-score: 53) Goal status: MET   ASSESSMENT:  CLINICAL IMPRESSION:  Patient is a 83 y.o.  female who was seen today for cognitive-linguistic tx. Pt with reports of worsening short term memory and wordfinding which has been highlighted during recent move to ILF. Pt expresses frustration with CLOF and is highly motivated to improve cognitive-linguistic functioning. Initial assessment was completed via informal means, formal cognitive assessment - Addenbrooke's Cognitive Evaluation (ACE-III), and PROM. Pt presents with mild cognitive-linguistic deficits affecting attention, memory, and verbal fluency. See details of tx session above. Recommend course of ST targeting education and compensations to improve cognitive-linguistic functioning and overall QoL.   OBJECTIVE IMPAIRMENTS include attention, memory, and expressive language. These impairments are limiting patient from effectively communicating at home and in community. Factors affecting potential to achieve goals and functional outcome are ability to learn/carryover information.. Patient will benefit from skilled SLP services to address above impairments and improve overall function.  REHAB POTENTIAL: Good  PLAN: SLP FREQUENCY: 1-2x/week  SLP DURATION: 12 weeks  PLANNED INTERVENTIONS: Cueing hierachy, Cognitive reorganization, Internal/external aids, Functional tasks, Multimodal communication approach, and SLP instruction and feedback   Dia Forget, M.S., CCC-SLP Speech-Language Pathologist [facility] [phone] [place])   [facility] Outpatient Rehabilitation at [facility] [address] [place], [place], [address] Phone: [phone]   Fax:  [phone]

## 2024-04-22 ENCOUNTER — Other Ambulatory Visit: Payer: Self-pay | Admitting: Licensed Clinical Social Worker

## 2024-04-22 ENCOUNTER — Other Ambulatory Visit: Payer: Self-pay | Admitting: Cardiology

## 2024-04-22 ENCOUNTER — Telehealth: Payer: Self-pay | Admitting: Cardiology

## 2024-04-22 ENCOUNTER — Other Ambulatory Visit: Payer: Self-pay

## 2024-04-22 ENCOUNTER — Other Ambulatory Visit: Payer: Self-pay | Admitting: Family Medicine

## 2024-04-22 MED ORDER — ATORVASTATIN CALCIUM 80 MG PO TABS: 80.0000 mg | ORAL_TABLET | Freq: Every day | ORAL | 0 refills | Status: DC

## 2024-04-22 NOTE — Telephone Encounter (Signed)
 Pt's medication was sent to pt's pharmacy as requested. Confirmation received.

## 2024-04-22 NOTE — Telephone Encounter (Signed)
 Last office visit 03/13/24 for Medical management of chronic issues.  Last refilled 03/18/24 for #150 with no refills.  Next appt: 06/13/24 DM.

## 2024-04-22 NOTE — Patient Outreach (Signed)
 Complex Care Management   Visit Note  04/22/2024  Name:  Morgan [name] MRN: [id number] DOB: 1941-04-02  Situation: Referral received for Complex Care Management related to stress management I obtained verbal consent from Patient.  Visit completed with patient   on the phone  Background:   Past Medical History:  Diagnosis Date   Angina    Arthritis    Atrial fibrillation (HCC)    ASPIRIN  FOR BLOOD THINNER   Atypical mole 09/22/2016   Bulging discs    lumbar    Complication of anesthesia    pt states has choking sensation with ET tube    Coronary artery disease    Depression    Diabetes mellitus    diet controlled/on meds   Family history of breast cancer    Family history of colon cancer    Fibromyalgia    GERD (gastroesophageal reflux disease)    H/O hiatal hernia    Headache(784.0)    "recurring"   High cholesterol    History of colon polyps    Hyperlipidemia    Hypertension    Jackhammer esophagus    Melanoma (HCC)    melanoma   Migraines    "til ~ 1980"   PONV (postoperative nausea and vomiting)    Restless leg syndrome    Sciatic nerve pain    "from pinched nerve"   Sleep apnea    uses CPAP   Stroke (HCC) 2014   no deficits   Weakness of right side of body    "I've had PT for it; they don't know what it's from".  CORTISONE INJECTION INTO BACK 08/30/12    Assessment: Patient Reported Symptoms:  Cognitive Cognitive Status: Alert and oriented to person, place, and time   Health Maintenance Behaviors: Sleep adequate  Neurological Neurological Review of Symptoms: Weakness Neurological Management Strategies: Adequate rest, Coping strategies  HEENT HEENT Symptoms Reported: Other: Other HEENT Symptoms/Conditions: wears glasses HEENT Management Strategies: Adequate rest    Cardiovascular Cardiovascular Symptoms Reported: Fatigue Does patient have uncontrolled Hypertension?: Yes Cardiovascular Conditions: Hypertension Cardiovascular Management  Strategies: Adequate rest  Respiratory Respiratory Symptoms Reported: No symptoms reported    Endocrine Patient reports the following symptoms related to hypoglycemia or hyperglycemia : No symptoms reported Is patient diabetic?: Yes Endocrine Management Strategies: Adequate rest  Gastrointestinal Gastrointestinal Symptoms Reported: Incontinence Gastrointestinal Management Strategies: Adequate rest Nutrition Risk Screen (CP): No indicators present  Genitourinary Genitourinary Symptoms Reported: Incontinence Genitourinary Management Strategies: Adequate rest  Integumentary Integumentary Symptoms Reported: No symptoms reported Skin Management Strategies: Adequate rest  Musculoskeletal Musculoskelatal Symptoms Reviewed: Weakness   Falls in the past year?: No    Psychosocial Psychosocial Symptoms Reported: Depression - if selected complete PHQ 2-9 Additional Psychological Details: Moved to [facility] facility about 5 months ago.  Resides in Independent Living apartment Behavioral Health Conditions: Depression Behavioral Management Strategies: Adequate rest   Quality of Family Relationships: supportive Do you feel physically threatened by others?: No      04/22/2024   12:10 PM  Depression screen PHQ 2/9  Decreased Interest 1  Down, Depressed, Hopeless 1  PHQ - 2 Score 2  Altered sleeping 0  Tired, decreased energy 1  Change in appetite 1  Feeling bad or failure about yourself  0  Trouble concentrating 0  Moving slowly or fidgety/restless 1  Suicidal thoughts 0  PHQ-9 Score 5  Difficult doing work/chores Somewhat difficult    Vitals:   Client is consulting with PCP in managing client BP readings Medications  Reviewed Today     Reviewed by Afton Horse (Social Worker) on 04/22/24 at 1159  Med List Status: <None>   Medication Order Taking? Sig Documenting Provider Last Dose Status Informant  albuterol  (VENTOLIN  HFA) 108 (90 Base) MCG/ACT inhaler [id number] Yes  Inhale 2 puffs into the lungs as needed for wheezing. [provider] Taking Active   Alum & Mag Hydroxide-Simeth (MAALOX REGULAR STRENGTH PO) [id number] No Take by mouth daily as needed. Patient takes 1 capful daily of the powder formulation.  Patient not taking: Reported on 04/22/2024   [provider] Not Taking Active Self  aspirin  EC 81 MG tablet [id number] Yes Take 1 tablet (81 mg total) by mouth daily. Hugh Madura, MD Taking Active Self  atorvastatin  (LIPITOR ) 80 MG tablet [id number] Yes Take 1 tablet (80 mg total) by mouth daily. Hugh Madura, MD Taking Active   BLINK TEARS 0.25 % SOLN [id number] Yes Place 1 drop into both eyes daily as needed (dry/irritated eyes.). [provider] Taking Active Self  calcium -vitamin D  (OSCAL WITH D) 500-200 MG-UNIT tablet [id number] Yes Take 1 tablet by mouth 2 (two) times daily. [provider] Taking Active Self  chlorthalidone  (HYGROTON ) 25 MG tablet [id number] Yes TAKE 1 TABLET BY MOUTH EVERY DAY Bedsole, Amy E, MD Taking Active   CINNAMON PO [id number] Yes Take 1,000 mg by mouth in the morning and at bedtime. [provider] Taking Active Self  empagliflozin  (JARDIANCE ) 10 MG TABS tablet [id number]  Take 1 tablet (10 mg total) by mouth daily before breakfast. Cherlyn Cornet, Amy E, MD  Active            Med Note Gaile Jourdain, Larene Pleasant   Tue Apr 22, 2024 11:56 AM) Patient taking Jardiance  on Trial basis   ferrous sulfate 325 (65 FE) MG tablet [id number] Yes Take 325 mg by mouth daily. [provider] Taking Active   gabapentin  (NEURONTIN ) 300 MG capsule [id number] Yes TAKE ONE CAPSULE IN THE MORNING, ONE CAPSULE IN THE EVENING AND 3 CAPSULES AT BEDTIME. Judithann Novas, MD Taking Active   Lancet Device MISC [id number] Yes Use to check blood sugar twice daily. Judithann Novas, MD Taking Active Self  Lancets (FREESTYLE) lancets [id number] Yes Use to check blood sugar twice daily Bedsole, Amy E, MD Taking Active Self   losartan  (COZAAR ) 50 MG tablet [id number] Yes Take 1 tablet (50 mg total) by mouth daily. Judithann Novas, MD Taking Active   metFORMIN  (GLUCOPHAGE -XR) 500 MG 24 hr tablet [id number] Yes TAKE 2 TABLETS BY MOUTH TWICE A DAY Bedsole, Amy E, MD Taking Active   methylcellulose (CITRUCEL) oral powder [id number] Yes Use as directed daily Armbruster, Lendon Queen, MD Taking Active            Med Note [facility], CHRISTAN M   Wed Feb 28, 2023  9:53 AM) As needed  metoprolol  succinate (TOPROL -XL) 100 MG 24 hr tablet [id number] Yes TAKE 1 TABLET BY MOUTH EVERY DAY WITH OR IMMEDIATELY FOLLOWING A MEAL Bedsole, Amy E, MD Taking Active   mirabegron  ER (MYRBETRIQ ) 50 MG TB24 tablet [id number] Yes Take 1 tablet (50 mg total) by mouth daily. Judithann Novas, MD Taking Active   Multiple Vitamins-Minerals (PRESERVISION AREDS PO) [id number] Yes Take 1 tablet by mouth 2 (two) times daily. Reported on 12/23/2015 [provider] Taking Active Self  nitroGLYCERIN  (NITROSTAT ) 0.4 MG SL tablet [id number] Yes PLACE 1 TABLET (0.4 MG TOTAL) UNDER THE TONGUE EVERY 5 (FIVE) MINUTES AS NEEDED FOR  CHEST PAIN Von Grumbling, PA-C Taking Active            Med Note Alray Jenny Jul 12, 2023 10:24 AM)    Emmett Harman ULTRA test strip [id number] Yes USE TO CHECK BLOOD SUGAR TWICE DAILY Judithann Novas, MD Taking Active Self  Probiotic Product (ALIGN PO) [id number] Yes Take by mouth at bedtime. [provider] Taking Active   QUEtiapine  (SEROQUEL ) 25 MG tablet [id number] Yes TAKE 1 TABLET BY MOUTH EVERYDAY AT BEDTIME Bedsole, Amy E, MD Taking Active   rOPINIRole  (REQUIP ) 1 MG tablet [id number] Yes 1/2 TABLET DURING THE DAY, 2 TABLETS AT BEDTIME Bedsole, Amy E, MD Taking Active   venlafaxine  XR (EFFEXOR -XR) 75 MG 24 hr capsule [id number] Yes TAKE 1 CAPSULE BY MOUTH DAILY WITH BREAKFAST. Judithann Novas, MD Taking Active   vitamin B-12 (CYANOCOBALAMIN ) 1000 MCG tablet [id number] Yes Take 1,000 mcg by mouth daily. [provider]  Taking Active             Recommendation:   Client to take medications as prescribed Client to attend scheduled medical appointments in next 30 days Client to communicate with PCP as needed for medical support Client to use coping skills to help manage stress issues faced Client to call LCSW as needed for SW support   Follow Up Plan:   Telephone follow up appointment date/time:  05/21/24 at 9:30 AM    Alexandria Angel  MSW, LCSW [place]/Value Based Care [facility] Licensed Clinical Social Worker Direct Dial:  [phone] Fax:  [phone] Website:  [email or web address]

## 2024-04-22 NOTE — Patient Instructions (Signed)
 Visit Information  Thank you for taking time to visit with me today. Please don't hesitate to contact me if I can be of assistance to you before our next scheduled appointment.  Our next appointment is by telephone on 05/21/24 at 9:30 AM   Please call the care guide team at [phone] if you need to cancel or reschedule your appointment.   Following is a copy of your care plan:   Goals Addressed             This Visit's Progress    VBCI Social Work Care Plan       Problems:   Caregiving Stress related to needs of her spouse Her husband is a Cytogeneticist Field seismologist) and goes periodically to [employer] in [place], [place]             Client uses  multiple medications and is managing her medications prescribed             Depression possibly              Some stress in managing daily activities                           CSW Clinical Goal(s):  Over the next 30 days the Patient will use coping skills learned to manage stress issues faced AEB client reduction in stress indicators  Over next 30 days client will attend scheduled medical appointments AEB documentation in EPIC record  .  Interventions:  Discussed needs of client's spouse             Discussed client use of prescribed medications. She takes numerous medication and uses pill box to help her take meds at correct times            Discussed medication costs             Discussed ambulation. She is not using a walking device            Discussed mood of client. Trying to manage stress issues faced. Using coping skills            Discussed client apartment with her spouse at [facility] facility in [place], [place]             Discussed client support with PCP             Discussed vision of client. She wears glasses             Discussed fact that client has challenges with incontinence. Discussed supplies she uses and costs of supplies           Encouraged client to call LCSW as needed for SW support  Patient Goals/Self-Care  Activities:  Attend medical appointments as scheduled            Take medications as prescribed            Help manage daily needs of her spouse            Allow time for self care and for ADLs completion  Plan:   Telephone follow up appointment with care management team member scheduled for:  05/21/24 at 9:30 AM         Please go to [facility] [address] [place] [phone]) if you are experiencing a Mental Health or Behavioral Health Crisis or need someone to talk to.  The patient verbalized understanding of instructions, educational materials,  and care plan provided today and DECLINED offer to receive copy of patient instructions, educational materials, and care plan.     Alexandria Angel  MSW, LCSW [place] Licensed Clinical Social Worker Direct Dial:  [phone] Fax:  [phone] Website:  [email or web address]

## 2024-04-22 NOTE — Telephone Encounter (Signed)
*  STAT* If patient is at the pharmacy, call can be transferred to refill team.   1. Which medications need to be refilled? (please list name of each medication and dose if known)  atorvastatin  (LIPITOR ) 80 MG tablet     2. Which pharmacy/location (including street and city if local pharmacy) is medication to be sent to? [id number] [place], [place] [phone]   3. Do they need a 30 day or 90 day supply?   90 day supply

## 2024-04-23 ENCOUNTER — Ambulatory Visit

## 2024-04-23 DIAGNOSIS — R41841 Cognitive communication deficit: Secondary | ICD-10-CM | POA: Diagnosis not present

## 2024-04-23 NOTE — Therapy (Signed)
 OUTPATIENT SPEECH LANGUAGE PATHOLOGY  TREATMENT    Patient Name: Morgan [name] MRN: [id number] DOB:[birth year], 83 y.o., female Today's Date: 04/23/2024     PCP: Herby Lolling, MD  REFERRING PROVIDER: Devora Folks, MD   End of Session - 04/23/24 1207     Visit Number 16    Number of Visits 24    Date for SLP Re-Evaluation 05/14/24    SLP Start Time 0855    SLP Stop Time  0930    SLP Time Calculation (min) 35 min    Activity Tolerance Patient tolerated treatment well              Patient Active Problem List   Diagnosis Date Noted   Microalbuminuria due to type 2 diabetes mellitus (HCC) 02/28/2024   Enteritis due to Norovirus 01/18/2024   Elevated lipase 01/18/2024   Acute post-traumatic headache, not intractable 08/14/2023   Bursitis of left hip 07/12/2023   GAD (generalized anxiety disorder) 02/21/2023   Closed nondisplaced fracture of sixth cervical vertebra with routine healing 08/08/2022   Acute renal insufficiency 08/08/2022   Diabetic mononeuropathy associated with type 2 diabetes mellitus (HCC) 05/19/2022   Accidental fall 03/10/2022   Hypomania (HCC) 02/10/2022   Weight loss 02/10/2022   Insomnia 08/16/2021   Iron deficiency anemia 08/16/2021   OAB (overactive bladder) 08/16/2021   Fire ant bite 08/19/2020   Hiatal hernia with GERD 01/21/2020   S/P laparoscopic fundoplication 01/21/2020   Globus sensation    Family history of breast cancer    History of colon polyps    Chronic diarrhea 01/14/2019   Malignant melanoma (HCC) 11/23/2016   Urge incontinence 11/23/2016   Osteopenia 09/15/2014   History of TIA (transient ischemic attack) 05/19/2014   Obesity (BMI 30-39.9) 10/08/2013   OA (osteoarthritis) of knee 07/07/2013   Chronic low back pain 05/09/2013   Obstructive sleep apnea 05/09/2013   GERD (gastroesophageal reflux disease) 05/09/2013   Hiatal hernia 05/09/2013   Fibromyalgia syndrome 05/09/2013   MDD (major depressive disorder), recurrent  episode, moderate (HCC) 05/09/2013   RLS (restless legs syndrome) 05/09/2013   Type 2 diabetes mellitus with vascular disease (HCC) 05/09/2013   Hyperlipidemia associated with type 2 diabetes mellitus (HCC) 05/09/2013   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  Pt accompanied by: self  PERTINENT HISTORY: Per Kaitlin Paich's note, 02/06/24, Dear Ms. [facility], It was our pleasure to participate in your care. We have typed up brief summary of what we discussed.  1. Unsteady gait - high falls risk  - no signs and symptoms suggestive of injury since 12/03/2023. Strict Emergency Room precautions   2. 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 Headaches - tension type versus hypertensive versus migraine with aura, improving  MRI Brain without contrast + NeuroQuant - 1. No reversible cause for symptoms. Mild chronic small vessel ischemia in the cerebral white matter. 2. Brain atrophy with NeuroQuant volumetric analysis of the brain,  see details on [employer].   Labs - ESR, CRP, CMP  10/17/2023 - satisfactory   4. Leg Length Discrepancy  X-ray hip with pelvis 10/09/2023 - unremarkable   5. Cognitive Impairment  Reports concerns about memory and forgetfulness. At 83 years old, worried about cognitive decline. No history of head injury or neurological symptoms. Agreed to perform bedside memory test and brain scan with NeuroQuant  without contrast to assess brain volume. Discussed the importance of mentally stimulating activities, good sunlight exposure, and dietary changes to improve cognition. - MRI Brain reviewed as above   6. Unintentional Weight Loss, 15 lbs in 3 months - stablized  Lost 15 pounds unintentionally. Denies trouble swallowing, hematuria, melena, chest pain, shortness of breath, lightheadedness, dizziness, neck pain, and lower back pain. Reports daytime sweats for the past six months. Recent lab work and x-ray of pelvis and lower back were satisfactory. Discussed potential causes including thyroid  dysfunction and anemia. Agreed to check thyroid  function, CBC, iron levels, and urine analysis. Referred to gastroenterology and primary care for further evaluation. Encouraged to use meal supplement shakes and monitor weight at home. - Labs reviewed: CBC, CMP, Iron, Ferritin, TSH, Urinalysis, Culture - Labs are okay with minor variations.   - follow-up with primary care provider  - Encourage meal supplement shakes and monitor weight at home Assessment & Plan 7. Recent Fall, without head strike, no new symptoms, no signs and symptoms suggestive of injury  Experienced a fall last night after turning too quickly and losing balance. Reports muscle tightness and soreness but denies chest pain, shortness of breath, head injury, loss of consciousness, or bruising. No new symptoms such as back pain, changes in bowel or bladder control, headaches, nausea, or vomiting. Physical exam revealed no bruising or tenderness in the ribs or lower back. Slight unbalance noted during walking test, but no lightheadedness or dizziness. x-rays of thoracic spine 12/11/2023 - Thoracic compression fracture and lumbar spine - Multilevel degenerative changes. No acute findings   8. Thoracic Compression Fracture  -Per orthopedics. MRIs are pending  9. General Health Maintenance Encouraged to maintain a healthy diet, sleep well, and engage in  mentally stimulating activities. Advised to see ENT or primary care for ear cleaning to address blocked ear and potential equilibrium issues. - Recommend seeing ENT or primary care for ear cleaning to address blocked ear and potential equilibrium issue  DIAGNOSTIC FINDINGS: MRI brain, 12/14/23, FINDINGS: Brain: No acute infarction, hemorrhage, hydrocephalus, extra-axial collection or mass lesion. Chronic small vessel ischemic gliosis in the cerebral white matter is mild for age. No chronic blood products or abnormal mineralization.   Vascular: Normal flow voids.   Skull and upper cervical spine: Normal marrow signal.   Sinuses/Orbits: Negative   NeuroQuant Findings:   Volumetric analysis of the brain was performed, with a fully detailed report in [employer]. Briefly, the comparison with age and gender matched reference reveals reasonable tagging cortex. Whole brain volume is low normal. The bilateral hippocampus is less than expected.   IMPRESSION: 1. No reversible cause for symptoms. Mild chronic small vessel ischemia in the cerebral white matter. 2. Brain atrophy with NeuroQuant volumetric analysis of the brain, see details on [employer].  PAIN:  Are you having pain? No   FALLS: Has patient fallen in last 6 months?  Yes  LIVING ENVIRONMENT: Lives with: lives with their spouse and in independent living apartment at [facility]   PLOF:  Level of assistance: Independent with ADLs Employment: Retired   PATIENT GOALS   for memory and communication to improve  OBJECTIVE:  TODAY'S TREATMENT:  Pt participated in skilled education re: typical cognitive changes in aging adults and ways to support the aging brain. Pt participated in meaningful conversation re: pt's CLOF and cognitive changes she has appreciated. Hand out provided.   PATIENT EDUCATION: Education details: as above Person educated: Patient Education method: Proofreader out Education  comprehension: verbalized understanding  HOME EXERCISE PROGRAM:        Continue implementation of recommended strategies    GOALS:  Goals reviewed with patient? Yes  SHORT TERM GOALS: Target date: 10 sessions  With Min A, patient will complete a semantic feature analysis with at least 3 relevant features for 8/10 target words to improve word-finding skills.   Baseline: Goal status: MET   2.  Pt will ID 2-3 strategies for improved attention/memory with min A. Baseline:  Goal status: MET  3.  Pt will report implementation of 2-3 strategies for improved attention and memory.  Baseline:  Goal status: MET  LONG TERM GOALS: Target date: 12 weeks  Pt will utilize compensatory strategies for anomia with modified independence during informal conversational exchanges. Baseline:  Goal status: MET  2.  Pt will demonstrate knowledge of appropriate of appropriate activities to promote cognitive-linguistic functioning outside of therapy. Baseline:  Goal status: PROGRESSING  3.  Pt will report improved memory per PROM. Baseline: Low - 20 to 29 Memory Satisfaction (T-score: 37) Below Average - 30-39 Memory Ability (T-score: 39) Average - 40 to 60 use of Memory Strategies (T-score: 53) Goal status: MET   ASSESSMENT:  CLINICAL IMPRESSION:  Patient is a 83 y.o. female who was seen today for cognitive-linguistic tx. Pt with reports of worsening short term memory and wordfinding which has been highlighted during recent move to ILF. Pt expresses frustration with CLOF and is highly motivated to improve cognitive-linguistic functioning. Initial assessment was completed via informal means, formal cognitive assessment - Adden[place]oke's Cognitive Evaluation (ACE-III), and PROM. Pt presents with mild cognitive-linguistic deficits affecting attention, memory, and verbal fluency. See details of tx session above. Recommend course of ST targeting education and compensations to improve cognitive-linguistic  functioning and overall QoL.   OBJECTIVE IMPAIRMENTS include attention, memory, and expressive language. These impairments are limiting patient from effectively communicating at home and in community. Factors affecting potential to achieve goals and functional outcome are ability to learn/carryover information.. Patient will benefit from skilled SLP services to address above impairments and improve overall function.  REHAB POTENTIAL: Good  PLAN: SLP FREQUENCY: 1-2x/week  SLP DURATION: 12 weeks  PLANNED INTERVENTIONS: Cueing hierachy, Cognitive reorganization, Internal/external aids, Functional tasks, Multimodal communication approach, and SLP instruction and feedback   Dia Forget, M.S., CCC-SLP Speech-Language Pathologist [facility] [phone] [place])   [facility] [address] [place], [place], [address] Phone: [phone]   Fax:  [phone]

## 2024-04-25 ENCOUNTER — Ambulatory Visit: Admitting: Physician Assistant

## 2024-04-25 VITALS — BP 123/60 | HR 67 | Ht 62.0 in | Wt 123.0 lb

## 2024-04-25 DIAGNOSIS — N3946 Mixed incontinence: Secondary | ICD-10-CM | POA: Diagnosis not present

## 2024-04-25 LAB — URINALYSIS, COMPLETE
Bilirubin, UA: NEGATIVE
Ketones, UA: NEGATIVE
Leukocytes,UA: NEGATIVE
Nitrite, UA: NEGATIVE
Protein,UA: NEGATIVE
RBC, UA: NEGATIVE
Specific Gravity, UA: 1.025 (ref 1.005–1.030)
Urobilinogen, Ur: 0.2 mg/dL (ref 0.2–1.0)
pH, UA: 6 (ref 5.0–7.5)

## 2024-04-25 LAB — BLADDER SCAN AMB NON-IMAGING

## 2024-04-25 LAB — MICROSCOPIC EXAMINATION: Epithelial Cells (non renal): >10 /HPF — AB (ref 0–10)

## 2024-04-25 NOTE — Progress Notes (Signed)
 04/25/2024 10:14 AM   Morgan [name] 07-07-1941 [id number]  CC: Chief Complaint  Patient presents with   mixed incontinence   HPI: Morgan [name] is a 83 y.o. female with PMH diabetes, lumbar spine issues, OSA on CPAP, CVA, prior hysterectomy, IBS with constipation and diarrhea, and OAB wet with mixed urge and stress incontinence who previously failed Myrbetriq  and Detrol  now on Gemtesa  who presents today for follow-up.   She saw Dr. Clarke Crouch most recently on 07/02/2023, at which point she was interested in pursuing PTNS, however this never occurred.  She had a benign cystoscopy that day.  Today she reports she remains on Gemtesa , however her urinary leakage has progressively worsened.  She wears 2-3 pull-ups and 2-3 pads daily and is having to change her clothing up to 4 times daily due to high volume leaks, which typically occur as soon as she arrives home.  She states she decided against PTNS due to the time commitment involved.  She has been on Jardiance  for about the last month, and her symptoms have been stable on this medication.  Notably, she also has weekly episodes of fecal incontinence, which can be diarrhea or formed stools.  In-office UA today positive for 3+ glucose; urine microscopy with >10 epithelial cells/hpf.  PVR 3mL.  PMH: Past Medical History:  Diagnosis Date   Angina    Arthritis    Atrial fibrillation (HCC)    ASPIRIN  FOR BLOOD THINNER   Atypical mole 09/22/2016   Bulging discs    lumbar    Complication of anesthesia    pt states has choking sensation with ET tube    Coronary artery disease    Depression    Diabetes mellitus    diet controlled/on meds   Family history of breast cancer    Family history of colon cancer    Fibromyalgia    GERD (gastroesophageal reflux disease)    H/O hiatal hernia    Headache(784.0)    recurring   High cholesterol    History of colon polyps    Hyperlipidemia    Hypertension    Jackhammer esophagus     Melanoma (HCC)    melanoma   Migraines    til ~ 1980   PONV (postoperative nausea and vomiting)    Restless leg syndrome    Sciatic nerve pain    from pinched nerve   Sleep apnea    uses CPAP   Stroke (HCC) 2014   no deficits   Weakness of right side of body    I've had PT for it; they don't know what it's from.  CORTISONE INJECTION INTO BACK 08/30/12    Surgical History: Past Surgical History:  Procedure Laterality Date   70 HOUR PH STUDY N/A 12/29/2015   Procedure: 24 HOUR PH STUDY;  Surgeon: Danette Duos, MD;  Location: [facility];  Service: Gastroenterology;  Laterality: N/A;   CARPAL TUNNEL RELEASE Left 07/02/2015   Procedure: CARPAL TUNNEL RELEASE;  Surgeon: Wendolyn Hamburger, MD;  Location: [facility];  Service: Orthopedics;  Laterality: Left;   CATARACT EXTRACTION, BILATERAL Bilateral    Oct and Nov 2017   COLONOSCOPY  06/30/2019   CORONARY ANGIOPLASTY WITH STENT PLACEMENT  06/2011   1   CORONARY ARTERY BYPASS GRAFT  2005   CABG X 2   DILATION AND CURETTAGE OF UTERUS     more than once   ELBOW ARTHROSCOPY Left 07/02/2015   Procedure: ARTHROSCOPY LEFT ELBOW WITH DEBRIDEMENT AND REMOVAL LOOSE  BODY;  Surgeon: Wendolyn Hamburger, MD;  Location: [facility];  Service: Orthopedics;  Laterality: Left;   ESOPHAGEAL DILATION  01/21/2020   Procedure: ESOPHAGEAL DILATION;  Surgeon: Annis Kinder, DO;  Location: [facility];  Service: Gastroenterology;;   ESOPHAGEAL MANOMETRY N/A 12/29/2015   Procedure: ESOPHAGEAL MANOMETRY (EM);  Surgeon: Danette Duos, MD;  Location: [facility];  Service: Gastroenterology;  Laterality: N/A;   ESOPHAGEAL MANOMETRY N/A 07/30/2019   Procedure: ESOPHAGEAL MANOMETRY (EM);  Surgeon: Ace Holder, MD;  Location: [facility];  Service: Gastroenterology;  Laterality: N/A;   ESOPHAGOGASTRODUODENOSCOPY (EGD) WITH PROPOFOL  N/A 01/21/2020   Procedure: ESOPHAGOGASTRODUODENOSCOPY (EGD) WITH PROPOFOL ;   Surgeon: Annis Kinder, DO;  Location: [facility];  Service: Gastroenterology;  Laterality: N/A;   FRACTURE SURGERY  ~ 2005   nose   KNEE ARTHROSCOPY  09/04/2012   Procedure: ARTHROSCOPY KNEE;  Surgeon: Aurther Blue, MD;  Location: [facility];  Service: Orthopedics;  Laterality: Right;  right knee arthroscopy with medial and lateral meniscus debridement   MELANOMA EXCISION Right 10/13/2016   right side of neck   MOUTH SURGERY  2004   bone replacement; had cadavear bones put in; face was collapsing   NASAL SEPTUM SURGERY  ~ 1986   TOTAL KNEE ARTHROPLASTY Right 07/07/2013   Procedure: RIGHT TOTAL KNEE ARTHROPLASTY;  Surgeon: Aurther Blue, MD;  Location: [facility];  Service: Orthopedics;  Laterality: Right;   TRANSORAL INCISIONLESS FUNDOPLICATION N/A 01/21/2020   Procedure: TRANSORAL INCISIONLESS FUNDOPLICATION;  Surgeon: Annis Kinder, DO;  Location: [facility];  Service: Gastroenterology;  Laterality: N/A;   UPPER GASTROINTESTINAL ENDOSCOPY      Home Medications:  Allergies as of 04/25/2024       Reactions   Hydrocodone -acetaminophen  Other (See Comments)   Changed personality made me very mean   Percocet [oxycodone -acetaminophen ] Other (See Comments)   hallucination   Sulfa Antibiotics Other (See Comments)   hallucinations        Medication List        Accurate as of April 25, 2024 10:14 AM. If you have any questions, ask your nurse or doctor.          albuterol  108 (90 Base) MCG/ACT inhaler Commonly known as: VENTOLIN  HFA Inhale 2 puffs into the lungs as needed for wheezing.   ALIGN PO Take by mouth at bedtime.   aspirin  EC 81 MG tablet Take 1 tablet (81 mg total) by mouth daily.   atorvastatin  80 MG tablet Commonly known as: LIPITOR  Take 1 tablet (80 mg total) by mouth daily.   Blink Tears 0.25 % Soln Generic drug: Polyethylene Glycol 400 Place 1 drop into both eyes daily as needed (dry/irritated eyes.).   calcium -vitamin D  500-200 MG-UNIT  tablet Commonly known as: OSCAL WITH D Take 1 tablet by mouth 2 (two) times daily.   chlorthalidone  25 MG tablet Commonly known as: HYGROTON  TAKE 1 TABLET BY MOUTH EVERY DAY   CINNAMON PO Take 1,000 mg by mouth in the morning and at bedtime.   Citrucel oral powder Generic drug: methylcellulose Use as directed daily   cyanocobalamin  1000 MCG tablet Commonly known as: VITAMIN B12 Take 1,000 mcg by mouth daily.   empagliflozin  10 MG Tabs tablet Commonly known as: JARDIANCE  Take 1 tablet (10 mg total) by mouth daily before breakfast.   ferrous sulfate 325 (65 FE) MG tablet Take 325 mg by mouth daily.   freestyle lancets Use to check blood sugar twice daily   gabapentin  300 MG  capsule Commonly known as: NEURONTIN  TAKE ONE CAPSULE IN THE MORNING, ONE CAPSULE IN THE EVENING AND 3 CAPSULES AT BEDTIME.   Lancet Device Misc Use to check blood sugar twice daily.   losartan  50 MG tablet Commonly known as: COZAAR  Take 1 tablet (50 mg total) by mouth daily.   MAALOX REGULAR STRENGTH PO Take by mouth daily as needed. Patient takes 1 capful daily of the powder formulation.   metFORMIN  500 MG 24 hr tablet Commonly known as: GLUCOPHAGE -XR TAKE 2 TABLETS BY MOUTH TWICE A DAY   metoprolol  succinate 100 MG 24 hr tablet Commonly known as: TOPROL -XL TAKE 1 TABLET BY MOUTH EVERY DAY WITH OR IMMEDIATELY FOLLOWING A MEAL   mirabegron  ER 50 MG Tb24 tablet Commonly known as: Myrbetriq  Take 1 tablet (50 mg total) by mouth daily.   nitroGLYCERIN  0.4 MG SL tablet Commonly known as: NITROSTAT  PLACE 1 TABLET (0.4 MG TOTAL) UNDER THE TONGUE EVERY 5 (FIVE) MINUTES AS NEEDED FOR CHEST PAIN   OneTouch Ultra test strip Generic drug: glucose blood USE TO CHECK BLOOD SUGAR TWICE DAILY   PRESERVISION AREDS PO Take 1 tablet by mouth 2 (two) times daily. Reported on 12/23/2015   QUEtiapine  25 MG tablet Commonly known as: SEROQUEL  TAKE 1 TABLET BY MOUTH EVERYDAY AT BEDTIME   rOPINIRole  1 MG  tablet Commonly known as: REQUIP  1/2 TABLET DURING THE DAY, 2 TABLETS AT BEDTIME   venlafaxine  XR 75 MG 24 hr capsule Commonly known as: EFFEXOR -XR TAKE 1 CAPSULE BY MOUTH DAILY WITH BREAKFAST.        Allergies:  Allergies  Allergen Reactions   Hydrocodone -Acetaminophen  Other (See Comments)    Changed personality made me very mean   Percocet [Oxycodone -Acetaminophen ] Other (See Comments)    hallucination   Sulfa Antibiotics Other (See Comments)    hallucinations    Family History: Family History  Problem Relation Age of Onset   Breast cancer Mother 53   Heart disease Father 4       sudden onset due to CAD   Hypertension Sister    Dementia Sister    Diabetes Sister    Cancer - Colon Sister 33   Colon cancer Sister    GER disease Daughter    Hypertension Daughter    Heart disease Brother    Hypertension Brother    Heart attack Neg Hx    Stroke Neg Hx    Esophageal cancer Neg Hx    Rectal cancer Neg Hx    Stomach cancer Neg Hx     Social History:   reports that she has never smoked. She has never used smokeless tobacco. She reports current alcohol use of about 1.0 standard drink of alcohol per week. She reports that she does not use drugs.  Physical Exam: There were no vitals taken for this visit.  Constitutional:  Alert and oriented, no acute distress, nontoxic appearing HEENT: Morgan [name], AT Cardiovascular: No clubbing, cyanosis, or edema Respiratory: Normal respiratory effort, no increased work of breathing Skin: No rashes, bruises or suspicious lesions Neurologic: Grossly intact, no focal deficits, moving all 4 extremities Psychiatric: Normal mood and affect  Laboratory Data: Results for orders placed or performed in visit on 04/25/24  Microscopic Examination   Collection Time: 04/25/24  9:45 AM   Urine  Result Value Ref Range   WBC, UA 0-5 0 - 5 /hpf   RBC, Urine 0-2 0 - 2 /hpf   Epithelial Cells (non renal) >10 (A) 0 - 10 /hpf   Bacteria,  UA Few None  seen/Few  Urinalysis, Complete   Collection Time: 04/25/24  9:45 AM  Result Value Ref Range   Specific Gravity, UA 1.025 1.005 - 1.030   pH, UA 6.0 5.0 - 7.5   Color, UA Yellow Yellow   Appearance Ur Clear Clear   Leukocytes,UA Negative Negative   Protein,UA Negative Negative/Trace   Glucose, UA 3+ (A) Negative   Ketones, UA Negative Negative   RBC, UA Negative Negative   Bilirubin, UA Negative Negative   Urobilinogen, Ur 0.2 0.2 - 1.0 mg/dL   Nitrite, UA Negative Negative   Microscopic Examination Comment    Microscopic Examination See below:   BLADDER SCAN AMB NON-IMAGING   Collection Time: 04/25/24 10:25 AM  Result Value Ref Range   Scan Result 3ml    *Note: Due to a large number of results and/or encounters for the requested time period, some results have not been displayed. A complete set of results can be found in Results Review.   Assessment & Plan:   1. Mixed incontinence (Primary) Progressive OAB wet with mixed urge and stress incontinence despite Gemtesa .  UA is bland today and she is emptying appropriately.  We reviewed third line therapies including PTNS, intravesical Botox, and InterStim.  We discussed that InterStim can help manage fecal incontinence as well and she is most interested in pursuing this.  Will schedule consultation with Dr. MacDiarmid. - Urinalysis, Complete - BLADDER SCAN AMB NON-IMAGING  Return for Interstim consult with Dr. Clarke Crouch (will call with appointment info).  Kathreen Pare, PA-C  [facility] [place] [address] [place], [place] [address] [phone]

## 2024-04-28 ENCOUNTER — Ambulatory Visit

## 2024-04-28 ENCOUNTER — Telehealth: Payer: Self-pay

## 2024-04-28 NOTE — Telephone Encounter (Signed)
 Contacted pt via cell phone regarding missed appointment this AM. Pt apologized stating that she thought her appointment was for 0845. Confirmed next appointment on Wednesday, 6/18.  Dia Forget, M.S., CCC-SLP Speech-Language Pathologist [place] [facility] [phone] ([facility])

## 2024-04-30 ENCOUNTER — Ambulatory Visit

## 2024-04-30 DIAGNOSIS — R41841 Cognitive communication deficit: Secondary | ICD-10-CM | POA: Diagnosis not present

## 2024-04-30 NOTE — Therapy (Signed)
 OUTPATIENT SPEECH LANGUAGE PATHOLOGY  TREATMENT    Patient Name: Morgan [name] MRN: [id number] DOB:24-Jul-1941, 83 y.o., female Today's Date: 04/30/2024     PCP: Herby Lolling, MD  REFERRING PROVIDER: Devora Folks, MD   End of Session - 04/30/24 0947     Visit Number 17    Number of Visits 24    Date for SLP Re-Evaluation 05/14/24    SLP Start Time 0845    SLP Stop Time  0935    SLP Time Calculation (min) 50 min    Activity Tolerance Patient tolerated treatment well           Patient Active Problem List   Diagnosis Date Noted   Microalbuminuria due to type 2 diabetes mellitus (HCC) 02/28/2024   Enteritis due to Norovirus 01/18/2024   Elevated lipase 01/18/2024   Acute post-traumatic headache, not intractable 08/14/2023   Bursitis of left hip 07/12/2023   GAD (generalized anxiety disorder) 02/21/2023   Closed nondisplaced fracture of sixth cervical vertebra with routine healing 08/08/2022   Acute renal insufficiency 08/08/2022   Diabetic mononeuropathy associated with type 2 diabetes mellitus (HCC) 05/19/2022   Accidental fall 03/10/2022   Hypomania (HCC) 02/10/2022   Weight loss 02/10/2022   Insomnia 08/16/2021   Iron deficiency anemia 08/16/2021   OAB (overactive bladder) 08/16/2021   Fire ant bite 08/19/2020   Hiatal hernia with GERD 01/21/2020   S/P laparoscopic fundoplication 01/21/2020   Globus sensation    Family history of breast cancer    History of colon polyps    Chronic diarrhea 01/14/2019   Malignant melanoma (HCC) 11/23/2016   Urge incontinence 11/23/2016   Osteopenia 09/15/2014   History of TIA (transient ischemic attack) 05/19/2014   Obesity (BMI 30-39.9) 10/08/2013   OA (osteoarthritis) of knee 07/07/2013   Chronic low back pain 05/09/2013   Obstructive sleep apnea 05/09/2013   GERD (gastroesophageal reflux disease) 05/09/2013   Hiatal hernia 05/09/2013   Fibromyalgia syndrome 05/09/2013   MDD (major depressive disorder), recurrent  episode, moderate (HCC) 05/09/2013   RLS (restless legs syndrome) 05/09/2013   Type 2 diabetes mellitus with vascular disease (HCC) 05/09/2013   Hyperlipidemia associated with type 2 diabetes mellitus (HCC) 05/09/2013   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  Pt accompanied by: self  PERTINENT HISTORY: Per Kaitlin Paich's note, 02/06/24, Dear Ms. [facility], It was our pleasure to participate in your care. We have typed up brief summary of what we discussed.  1. Unsteady gait - high falls risk  - no signs and symptoms suggestive of injury since 12/03/2023. Strict Emergency Room precautions   2. 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 Headaches - tension type versus hypertensive versus migraine with aura, improving  MRI Brain without contrast + NeuroQuant - 1. No reversible cause for symptoms. Mild chronic small vessel ischemia in the cerebral white matter. 2. Brain atrophy with NeuroQuant volumetric analysis of the brain,  see details on [employer].   Labs - ESR, CRP, CMP 10/17/2023 - satisfactory  4. Leg Length Discrepancy  X-ray hip with pelvis 10/09/2023 - unremarkable   5. Cognitive Impairment  Reports concerns about memory and forgetfulness. At 83 years old, worried about cognitive decline. No history of head injury or neurological symptoms. Agreed to perform bedside memory test and brain scan with NeuroQuant  without contrast to assess brain volume. Discussed the importance of mentally stimulating activities, good sunlight exposure, and dietary changes to improve cognition. - MRI Brain reviewed as above   6. Unintentional Weight Loss, 15 lbs in 3 months - stablized  Lost 15 pounds unintentionally. Denies trouble swallowing, hematuria, melena, chest pain, shortness of breath, lightheadedness, dizziness, neck pain, and lower back pain. Reports daytime sweats for the past six months. Recent lab work and x-ray of pelvis and lower back were satisfactory. Discussed potential causes including thyroid  dysfunction and anemia. Agreed to check thyroid  function, CBC, iron levels, and urine analysis. Referred to gastroenterology and primary care for further evaluation. Encouraged to use meal supplement shakes and monitor weight at home. - Labs reviewed: CBC, CMP, Iron, Ferritin, TSH, Urinalysis, Culture - Labs are okay with minor variations.   - follow-up with primary care provider  - Encourage meal supplement shakes and monitor weight at home Assessment & Plan 7. Recent Fall, without head strike, no new symptoms, no signs and symptoms suggestive of injury  Experienced a fall last night after turning too quickly and losing balance. Reports muscle tightness and soreness but denies chest pain, shortness of breath, head injury, loss of consciousness, or bruising. No new symptoms such as back pain, changes in bowel or bladder control, headaches, nausea, or vomiting. Physical exam revealed no bruising or tenderness in the ribs or lower back. Slight unbalance noted during walking test, but no lightheadedness or dizziness. x-rays of thoracic spine 12/11/2023 - Thoracic compression fracture and lumbar spine - Multilevel degenerative changes. No acute findings   8. Thoracic Compression Fracture  -Per orthopedics. MRIs are pending  9. General Health Maintenance Encouraged to maintain a healthy diet, sleep well, and engage in  mentally stimulating activities. Advised to see ENT or primary care for ear cleaning to address blocked ear and potential equilibrium issues. - Recommend seeing ENT or primary care for ear cleaning to address blocked ear and potential equilibrium issue  DIAGNOSTIC FINDINGS: MRI brain, 12/14/23, FINDINGS: Brain: No acute infarction, hemorrhage, hydrocephalus, extra-axial collection or mass lesion. Chronic small vessel ischemic gliosis in the cerebral white matter is mild for age. No chronic blood products or abnormal mineralization.   Vascular: Normal flow voids.   Skull and upper cervical spine: Normal marrow signal.   Sinuses/Orbits: Negative   NeuroQuant Findings:   Volumetric analysis of the brain was performed, with a fully detailed report in [employer]. Briefly, the comparison with age and gender matched reference reveals reasonable tagging cortex. Whole brain volume is low normal. The bilateral hippocampus is less than expected.   IMPRESSION: 1. No reversible cause for symptoms. Mild chronic small vessel ischemia in the cerebral white matter. 2. Brain atrophy with NeuroQuant volumetric analysis of the brain, see details on [employer].  PAIN:  Are you having pain? No   FALLS: Has patient fallen in last 6 months?  Yes  LIVING ENVIRONMENT: Lives with: lives with their spouse and in independent living apartment at [facility]   PLOF:  Level of assistance: Independent with ADLs Employment: Retired   PATIENT GOALS   for memory and communication to improve  OBJECTIVE:   TODAY'S TREATMENT:  Pt participated  in skilled education re: concept of pre-planning and pre-planning strategies to improve efficiency and accuracy of iADLs. Pt participated in meaningful conversation re: pt's CLOF and cognitive changes she has appreciated. Hand out provided. Pt identified trying to set timers/alarms for out of the ordinary events (e.g. putting roast in oven) and setting  deadlines for to do items to ensure completion. Will f/u next session with progress/implementation.   PATIENT EDUCATION: Education details: as above Person educated: Patient Education method: Proofreader out Education comprehension: verbalized understanding  HOME EXERCISE PROGRAM:        Continue implementation of recommended strategies    GOALS:  Goals reviewed with patient? Yes  SHORT TERM GOALS: Target date: 10 sessions  With Min A, patient will complete a semantic feature analysis with at least 3 relevant features for 8/10 target words to improve word-finding skills.   Baseline: Goal status: MET   2.  Pt will ID 2-3 strategies for improved attention/memory with min A. Baseline:  Goal status: MET  3.  Pt will report implementation of 2-3 strategies for improved attention and memory.  Baseline:  Goal status: MET  LONG TERM GOALS: Target date: 12 weeks  Pt will utilize compensatory strategies for anomia with modified independence during informal conversational exchanges. Baseline:  Goal status: MET  2.  Pt will demonstrate knowledge of appropriate of appropriate activities to promote cognitive-linguistic functioning outside of therapy. Baseline:  Goal status: PROGRESSING  3.  Pt will report improved memory per PROM. Baseline: Low - 20 to 29 Memory Satisfaction (T-score: 37) Below Average - 30-39 Memory Ability (T-score: 39) Average - 40 to 60 use of Memory Strategies (T-score: 53) Goal status: MET   ASSESSMENT:  CLINICAL IMPRESSION:  Patient is a 83 y.o. female who was seen today for cognitive-linguistic tx. Pt with reports of worsening short term memory and wordfinding which has been highlighted during recent move to ILF. Pt expresses frustration with CLOF and is highly motivated to improve cognitive-linguistic functioning. Initial assessment was completed via informal means, formal cognitive assessment - Addenbrooke's Cognitive Evaluation (ACE-III), and  PROM. Pt presents with mild cognitive-linguistic deficits affecting attention, memory, and verbal fluency. See details of tx session above. Recommend course of ST targeting education and compensations to improve cognitive-linguistic functioning and overall QoL.   OBJECTIVE IMPAIRMENTS include attention, memory, and expressive language. These impairments are limiting patient from effectively communicating at home and in community. Factors affecting potential to achieve goals and functional outcome are ability to learn/carryover information.. Patient will benefit from skilled SLP services to address above impairments and improve overall function.  REHAB POTENTIAL: Good  PLAN: SLP FREQUENCY: 1-2x/week  SLP DURATION: 12 weeks  PLANNED INTERVENTIONS: Cueing hierachy, Cognitive reorganization, Internal/external aids, Functional tasks, Multimodal communication approach, and SLP instruction and feedback   Dia Forget, M.S., CCC-SLP Speech-Language Pathologist [facility] [phone] [place])    [facility] Outpatient Rehabilitation at [facility] [address] [place], [place], [address] Phone: [phone]   Fax:  [phone]

## 2024-05-05 ENCOUNTER — Ambulatory Visit

## 2024-05-05 DIAGNOSIS — R41841 Cognitive communication deficit: Secondary | ICD-10-CM

## 2024-05-05 NOTE — Therapy (Signed)
 OUTPATIENT SPEECH LANGUAGE PATHOLOGY  TREATMENT    Patient Name: Morgan [name] MRN: [id number] DOB:04/20/1941, 83 y.o., female Today's Date: 05/05/2024     PCP: Greig Ring, MD  REFERRING PROVIDER: Jannett Fairly, MD   End of Session - 05/05/24 0803     Visit Number 18    Number of Visits 24    Date for SLP Re-Evaluation 05/14/24    SLP Start Time 0803    SLP Stop Time  0845    SLP Time Calculation (min) 42 min    Activity Tolerance Patient tolerated treatment well           Patient Active Problem List   Diagnosis Date Noted   Microalbuminuria due to type 2 diabetes mellitus (HCC) 02/28/2024   Enteritis due to Norovirus 01/18/2024   Elevated lipase 01/18/2024   Acute post-traumatic headache, not intractable 08/14/2023   Bursitis of left hip 07/12/2023   GAD (generalized anxiety disorder) 02/21/2023   Closed nondisplaced fracture of sixth cervical vertebra with routine healing 08/08/2022   Acute renal insufficiency 08/08/2022   Diabetic mononeuropathy associated with type 2 diabetes mellitus (HCC) 05/19/2022   Accidental fall 03/10/2022   Hypomania (HCC) 02/10/2022   Weight loss 02/10/2022   Insomnia 08/16/2021   Iron deficiency anemia 08/16/2021   OAB (overactive bladder) 08/16/2021   Fire ant bite 08/19/2020   Hiatal hernia with GERD 01/21/2020   S/P laparoscopic fundoplication 01/21/2020   Globus sensation    Family history of breast cancer    History of colon polyps    Chronic diarrhea 01/14/2019   Malignant melanoma (HCC) 11/23/2016   Urge incontinence 11/23/2016   Osteopenia 09/15/2014   History of TIA (transient ischemic attack) 05/19/2014   Obesity (BMI 30-39.9) 10/08/2013   OA (osteoarthritis) of knee 07/07/2013   Chronic low back pain 05/09/2013   Obstructive sleep apnea 05/09/2013   GERD (gastroesophageal reflux disease) 05/09/2013   Hiatal hernia 05/09/2013   Fibromyalgia syndrome 05/09/2013   MDD (major depressive disorder), recurrent  episode, moderate (HCC) 05/09/2013   RLS (restless legs syndrome) 05/09/2013   Type 2 diabetes mellitus with vascular disease (HCC) 05/09/2013   Hyperlipidemia associated with type 2 diabetes mellitus (HCC) 05/09/2013   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  Pt accompanied by: self  PERTINENT HISTORY: Per Kaitlin Paich's note, 02/06/24, Dear Ms. [facility], It was our pleasure to participate in your care. We have typed up brief summary of what we discussed.  1. Unsteady gait - high falls risk  - no signs and symptoms suggestive of injury since 12/03/2023. Strict Emergency Room precautions   2. 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 Headaches - tension type versus hypertensive versus migraine with aura, improving  MRI Brain without contrast + NeuroQuant - 1. No reversible cause for symptoms. Mild chronic small vessel ischemia in the cerebral white matter. 2. Brain atrophy with NeuroQuant volumetric analysis of the brain,  see details on [employer].   Labs - ESR, CRP, CMP 10/17/2023 - satisfactory  4. Leg Length Discrepancy  X-ray hip with pelvis 10/09/2023 - unremarkable   5. Cognitive Impairment  Reports concerns about memory and forgetfulness. At 83 years old, worried about cognitive decline. No history of head injury or neurological symptoms. Agreed to perform bedside memory test and brain scan with NeuroQuant  without contrast to assess brain volume. Discussed the importance of mentally stimulating activities, good sunlight exposure, and dietary changes to improve cognition. - MRI Brain reviewed as above   6. Unintentional Weight Loss, 15 lbs in 3 months - stablized  Lost 15 pounds unintentionally. Denies trouble swallowing, hematuria, melena, chest pain, shortness of breath, lightheadedness, dizziness, neck pain, and lower back pain. Reports daytime sweats for the past six months. Recent lab work and x-ray of pelvis and lower back were satisfactory. Discussed potential causes including thyroid  dysfunction and anemia. Agreed to check thyroid  function, CBC, iron levels, and urine analysis. Referred to gastroenterology and primary care for further evaluation. Encouraged to use meal supplement shakes and monitor weight at home. - Labs reviewed: CBC, CMP, Iron, Ferritin, TSH, Urinalysis, Culture - Labs are okay with minor variations.   - follow-up with primary care provider  - Encourage meal supplement shakes and monitor weight at home Assessment & Plan 7. Recent Fall, without head strike, no new symptoms, no signs and symptoms suggestive of injury  Experienced a fall last night after turning too quickly and losing balance. Reports muscle tightness and soreness but denies chest pain, shortness of breath, head injury, loss of consciousness, or bruising. No new symptoms such as back pain, changes in bowel or bladder control, headaches, nausea, or vomiting. Physical exam revealed no bruising or tenderness in the ribs or lower back. Slight unbalance noted during walking test, but no lightheadedness or dizziness. x-rays of thoracic spine 12/11/2023 - Thoracic compression fracture and lumbar spine - Multilevel degenerative changes. No acute findings   8. Thoracic Compression Fracture  -Per orthopedics. MRIs are pending  9. General Health Maintenance Encouraged to maintain a healthy diet, sleep well, and engage in  mentally stimulating activities. Advised to see ENT or primary care for ear cleaning to address blocked ear and potential equilibrium issues. - Recommend seeing ENT or primary care for ear cleaning to address blocked ear and potential equilibrium issue  DIAGNOSTIC FINDINGS: MRI brain, 12/14/23, FINDINGS: Brain: No acute infarction, hemorrhage, hydrocephalus, extra-axial collection or mass lesion. Chronic small vessel ischemic gliosis in the cerebral white matter is mild for age. No chronic blood products or abnormal mineralization.   Vascular: Normal flow voids.   Skull and upper cervical spine: Normal marrow signal.   Sinuses/Orbits: Negative   NeuroQuant Findings:   Volumetric analysis of the brain was performed, with a fully detailed report in [employer]. Briefly, the comparison with age and gender matched reference reveals reasonable tagging cortex. Whole brain volume is low normal. The bilateral hippocampus is less than expected.   IMPRESSION: 1. No reversible cause for symptoms. Mild chronic small vessel ischemia in the cerebral white matter. 2. Brain atrophy with NeuroQuant volumetric analysis of the brain, see details on [employer].  PAIN:  Are you having pain? No   FALLS: Has patient fallen in last 6 months?  Yes  LIVING ENVIRONMENT: Lives with: lives with their spouse and in independent living apartment at [facility]   PLOF:  Level of assistance: Independent with ADLs Employment: Retired   PATIENT GOALS   for memory and communication to improve  OBJECTIVE:   TODAY'S TREATMENT:  Pt participated  in review of skilled education re: concept of pre-planning and pre-planning strategies to improve efficiency and accuracy of iADLs. Pt participated in meaningful conversation re: pt's CLOF and cognitive changes she has appreciated. Pt reported successful implementation of the swallowing strategies:  trying to set timers/alarms for out of the ordinary events (e.g.  putting roast in oven) and setting deadlines for to do items to ensure completion. Pt noted often forgetting items she needs before leaving the house. Pt tasked with leaving note/sign by door for commonly forgotten items (e.g. wallet/purse, phone, keys, cart). Will f/u re: implementation next session.   PATIENT EDUCATION: Education details: as above Person educated: Patient Education method: Proofreader out Education comprehension: verbalized understanding  HOME EXERCISE PROGRAM:        Continue implementation of recommended strategies    GOALS:  Goals reviewed with patient? Yes  SHORT TERM GOALS: Target date: 10 sessions  With Min A, patient will complete a semantic feature analysis with at least 3 relevant features for 8/10 target words to improve word-finding skills.   Baseline: Goal status: MET   2.  Pt will ID 2-3 strategies for improved attention/memory with min A. Baseline:  Goal status: MET  3.  Pt will report implementation of 2-3 strategies for improved attention and memory.  Baseline:  Goal status: MET  LONG TERM GOALS: Target date: 12 weeks  Pt will utilize compensatory strategies for anomia with modified independence during informal conversational exchanges. Baseline:  Goal status: MET  2.  Pt will demonstrate knowledge of appropriate of appropriate activities to promote cognitive-linguistic functioning outside of therapy. Baseline:  Goal status: PROGRESSING  3.  Pt will report improved memory per PROM. Baseline: Low - 20 to 29 Memory Satisfaction (T-score: 37) Below Average - 30-39 Memory Ability (T-score: 39) Average - 40 to 60 use of Memory Strategies (T-score: 53) Goal status: MET   ASSESSMENT:  CLINICAL IMPRESSION:  Patient is a 83 y.o. female who was seen today for cognitive-linguistic tx. Pt with reports of worsening short term memory and wordfinding which has been highlighted during recent move to ILF. Pt expresses frustration with CLOF  and is highly motivated to improve cognitive-linguistic functioning. Initial assessment was completed via informal means, formal cognitive assessment - Addenbrooke's Cognitive Evaluation (ACE-III), and PROM. Pt presents with mild cognitive-linguistic deficits affecting attention, memory, and verbal fluency. See details of tx session above. Recommend course of ST targeting education and compensations to improve cognitive-linguistic functioning and overall QoL.   OBJECTIVE IMPAIRMENTS include attention, memory, and expressive language. These impairments are limiting patient from effectively communicating at home and in community. Factors affecting potential to achieve goals and functional outcome are ability to learn/carryover information.. Patient will benefit from skilled SLP services to address above impairments and improve overall function.  REHAB POTENTIAL: Good  PLAN: SLP FREQUENCY: 1-2x/week  SLP DURATION: 12 weeks  PLANNED INTERVENTIONS: Cueing hierachy, Cognitive reorganization, Internal/external aids, Functional tasks, Multimodal communication approach, and SLP instruction and feedback   Delon Bangs, M.S., CCC-SLP Speech-Language Pathologist [facility] [phone] [place])   [facility] Outpatient Rehabilitation at [facility] [address] [place], [place], [address] Phone: [phone]   Fax:  [phone]

## 2024-05-07 ENCOUNTER — Ambulatory Visit

## 2024-05-07 DIAGNOSIS — R41841 Cognitive communication deficit: Secondary | ICD-10-CM

## 2024-05-07 NOTE — Therapy (Signed)
 OUTPATIENT SPEECH LANGUAGE PATHOLOGY  TREATMENT    Patient Name: Morgan [name] MRN: [id number] DOB:August 01, 1941, 83 y.o., female Today's Date: 05/07/2024     PCP: Greig Ring, MD  REFERRING PROVIDER: Jannett Fairly, MD   End of Session - 05/07/24 0854     Visit Number 19    Number of Visits 24    Date for SLP Re-Evaluation 05/14/24    SLP Start Time 0855    SLP Stop Time  0930    SLP Time Calculation (min) 35 min    Activity Tolerance Patient tolerated treatment well           Patient Active Problem List   Diagnosis Date Noted   Microalbuminuria due to type 2 diabetes mellitus (HCC) 02/28/2024   Enteritis due to Norovirus 01/18/2024   Elevated lipase 01/18/2024   Acute post-traumatic headache, not intractable 08/14/2023   Bursitis of left hip 07/12/2023   GAD (generalized anxiety disorder) 02/21/2023   Closed nondisplaced fracture of sixth cervical vertebra with routine healing 08/08/2022   Acute renal insufficiency 08/08/2022   Diabetic mononeuropathy associated with type 2 diabetes mellitus (HCC) 05/19/2022   Accidental fall 03/10/2022   Hypomania (HCC) 02/10/2022   Weight loss 02/10/2022   Insomnia 08/16/2021   Iron deficiency anemia 08/16/2021   OAB (overactive bladder) 08/16/2021   Fire ant bite 08/19/2020   Hiatal hernia with GERD 01/21/2020   S/P laparoscopic fundoplication 01/21/2020   Globus sensation    Family history of breast cancer    History of colon polyps    Chronic diarrhea 01/14/2019   Malignant melanoma (HCC) 11/23/2016   Urge incontinence 11/23/2016   Osteopenia 09/15/2014   History of TIA (transient ischemic attack) 05/19/2014   Obesity (BMI 30-39.9) 10/08/2013   OA (osteoarthritis) of knee 07/07/2013   Chronic low back pain 05/09/2013   Obstructive sleep apnea 05/09/2013   GERD (gastroesophageal reflux disease) 05/09/2013   Hiatal hernia 05/09/2013   Fibromyalgia syndrome 05/09/2013   MDD (major depressive disorder), recurrent  episode, moderate (HCC) 05/09/2013   RLS (restless legs syndrome) 05/09/2013   Type 2 diabetes mellitus with vascular disease (HCC) 05/09/2013   Hyperlipidemia associated with type 2 diabetes mellitus (HCC) 05/09/2013   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  Pt accompanied by: self  PERTINENT HISTORY: Per Kaitlin Paich's note, 02/06/24, Dear Ms. [facility], It was our pleasure to participate in your care. We have typed up brief summary of what we discussed.  1. Unsteady gait - high falls risk  - no signs and symptoms suggestive of injury since 12/03/2023. Strict Emergency Room precautions   2. 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 Headaches - tension type versus hypertensive versus migraine with aura, improving  MRI Brain without contrast + NeuroQuant - 1. No reversible cause for symptoms. Mild chronic small vessel ischemia in the cerebral white matter. 2. Brain atrophy with NeuroQuant volumetric analysis of the brain,  see details on [employer].   Labs - ESR, CRP, CMP 10/17/2023 - satisfactory  4. Leg Length Discrepancy  X-ray hip with pelvis 10/09/2023 - unremarkable   5. Cognitive Impairment  Reports concerns about memory and forgetfulness. At 83 years old, worried about cognitive decline. No history of head injury or neurological symptoms. Agreed to perform bedside memory test and brain scan with NeuroQuant  without contrast to assess brain volume. Discussed the importance of mentally stimulating activities, good sunlight exposure, and dietary changes to improve cognition. - MRI Brain reviewed as above   6. Unintentional Weight Loss, 15 lbs in 3 months - stablized  Lost 15 pounds unintentionally. Denies trouble swallowing, hematuria, melena, chest pain, shortness of breath, lightheadedness, dizziness, neck pain, and lower back pain. Reports daytime sweats for the past six months. Recent lab work and x-ray of pelvis and lower back were satisfactory. Discussed potential causes including thyroid  dysfunction and anemia. Agreed to check thyroid  function, CBC, iron levels, and urine analysis. Referred to gastroenterology and primary care for further evaluation. Encouraged to use meal supplement shakes and monitor weight at home. - Labs reviewed: CBC, CMP, Iron, Ferritin, TSH, Urinalysis, Culture - Labs are okay with minor variations.   - follow-up with primary care provider  - Encourage meal supplement shakes and monitor weight at home Assessment & Plan 7. Recent Fall, without head strike, no new symptoms, no signs and symptoms suggestive of injury  Experienced a fall last night after turning too quickly and losing balance. Reports muscle tightness and soreness but denies chest pain, shortness of breath, head injury, loss of consciousness, or bruising. No new symptoms such as back pain, changes in bowel or bladder control, headaches, nausea, or vomiting. Physical exam revealed no bruising or tenderness in the ribs or lower back. Slight unbalance noted during walking test, but no lightheadedness or dizziness. x-rays of thoracic spine 12/11/2023 - Thoracic compression fracture and lumbar spine - Multilevel degenerative changes. No acute findings   8. Thoracic Compression Fracture  -Per orthopedics. MRIs are pending  9. General Health Maintenance Encouraged to maintain a healthy diet, sleep well, and engage in  mentally stimulating activities. Advised to see ENT or primary care for ear cleaning to address blocked ear and potential equilibrium issues. - Recommend seeing ENT or primary care for ear cleaning to address blocked ear and potential equilibrium issue  DIAGNOSTIC FINDINGS: MRI brain, 12/14/23, FINDINGS: Brain: No acute infarction, hemorrhage, hydrocephalus, extra-axial collection or mass lesion. Chronic small vessel ischemic gliosis in the cerebral white matter is mild for age. No chronic blood products or abnormal mineralization.   Vascular: Normal flow voids.   Skull and upper cervical spine: Normal marrow signal.   Sinuses/Orbits: Negative   NeuroQuant Findings:   Volumetric analysis of the brain was performed, with a fully detailed report in [employer]. Briefly, the comparison with age and gender matched reference reveals reasonable tagging cortex. Whole brain volume is low normal. The bilateral hippocampus is less than expected.   IMPRESSION: 1. No reversible cause for symptoms. Mild chronic small vessel ischemia in the cerebral white matter. 2. Brain atrophy with NeuroQuant volumetric analysis of the brain, see details on [employer].  PAIN:  Are you having pain? No   FALLS: Has patient fallen in last 6 months?  Yes  LIVING ENVIRONMENT: Lives with: lives with their spouse and in independent living apartment at [facility]   PLOF:  Level of assistance: Independent with ADLs Employment: Retired   PATIENT GOALS   for memory and communication to improve  OBJECTIVE:   TODAY'S TREATMENT:  Pt participated  in review of skilled education re: concept of pre-planning and pre-planning strategies to improve efficiency and accuracy of iADLs. Pt endorsed leaving note/sign by door for commonly forgotten items (e.g. wallet/purse, phone, keys, cart) after discussion last session. Introduced stop-think-do strategy. Pt able to verbally walkthrough scenarios with implementation  of strategy.   PATIENT EDUCATION: Education details: as above Person educated: Patient Education method: Programmer, multimedia;  Education comprehension: verbalized understanding  HOME EXERCISE PROGRAM:        Continue implementation of recommended strategies    GOALS:  Goals reviewed with patient? Yes  SHORT TERM GOALS: Target date: 10 sessions  With Min A, patient will complete a semantic feature analysis with at least 3 relevant features for 8/10 target words to improve word-finding skills.   Baseline: Goal status: MET   2.  Pt will ID 2-3 strategies for improved attention/memory with min A. Baseline:  Goal status: MET  3.  Pt will report implementation of 2-3 strategies for improved attention and memory.  Baseline:  Goal status: MET  LONG TERM GOALS: Target date: 12 weeks  Pt will utilize compensatory strategies for anomia with modified independence during informal conversational exchanges. Baseline:  Goal status: MET  2.  Pt will demonstrate knowledge of appropriate of appropriate activities to promote cognitive-linguistic functioning outside of therapy. Baseline:  Goal status: PROGRESSING  3.  Pt will report improved memory per PROM. Baseline: Low - 20 to 29 Memory Satisfaction (T-score: 37) Below Average - 30-39 Memory Ability (T-score: 39) Average - 40 to 60 use of Memory Strategies (T-score: 53) Goal status: MET   ASSESSMENT:  CLINICAL IMPRESSION:  Patient is a 83 y.o. female who was seen today for cognitive-linguistic tx. Pt with reports of worsening short term memory and wordfinding which has been highlighted during recent move to ILF. Pt expresses frustration with CLOF and is highly motivated to improve cognitive-linguistic functioning. Initial assessment was completed via informal means, formal cognitive assessment - [place]'s Cognitive Evaluation (ACE-III), and PROM. Pt presents with mild cognitive-linguistic deficits affecting attention, memory, and verbal  fluency. See details of tx session above. Recommend course of ST targeting education and compensations to improve cognitive-linguistic functioning and overall QoL.   OBJECTIVE IMPAIRMENTS include attention, memory, and expressive language. These impairments are limiting patient from effectively communicating at home and in community. Factors affecting potential to achieve goals and functional outcome are ability to learn/carryover information.. Patient will benefit from skilled SLP services to address above impairments and improve overall function.  REHAB POTENTIAL: Good  PLAN: SLP FREQUENCY: 1-2x/week  SLP DURATION: 12 weeks  PLANNED INTERVENTIONS: Cueing hierachy, Cognitive reorganization, Internal/external aids, Functional tasks, Multimodal communication approach, and SLP instruction and feedback   Delon Bangs, M.S., CCC-SLP Speech-Language Pathologist [facility] [phone] [place])   [facility] Outpatient Rehabilitation at [facility] [address] [place], [place], [address] Phone: [phone]   Fax:  [phone]

## 2024-05-09 DIAGNOSIS — M48062 Spinal stenosis, lumbar region with neurogenic claudication: Secondary | ICD-10-CM | POA: Diagnosis not present

## 2024-05-09 DIAGNOSIS — S22080D Wedge compression fracture of T11-T12 vertebra, subsequent encounter for fracture with routine healing: Secondary | ICD-10-CM | POA: Diagnosis not present

## 2024-05-09 DIAGNOSIS — M47816 Spondylosis without myelopathy or radiculopathy, lumbar region: Secondary | ICD-10-CM | POA: Diagnosis not present

## 2024-05-12 ENCOUNTER — Ambulatory Visit

## 2024-05-12 DIAGNOSIS — R41841 Cognitive communication deficit: Secondary | ICD-10-CM | POA: Diagnosis not present

## 2024-05-12 NOTE — Therapy (Signed)
 OUTPATIENT SPEECH LANGUAGE PATHOLOGY  TREATMENT    Patient Name: Morgan [name] MRN: [id number] DOB:03/24/1941, 83 y.o., female Today's Date: 05/12/2024     PCP: Greig Ring, MD  REFERRING PROVIDER: Jannett Fairly, MD   End of Session - 05/12/24 0909     Visit Number 20    Number of Visits 24    Date for SLP Re-Evaluation 05/14/24    SLP Start Time 0803    SLP Stop Time  0903    SLP Time Calculation (min) 60 min    Activity Tolerance Patient tolerated treatment well           Patient Active Problem List   Diagnosis Date Noted   Microalbuminuria due to type 2 diabetes mellitus (HCC) 02/28/2024   Enteritis due to Norovirus 01/18/2024   Elevated lipase 01/18/2024   Acute post-traumatic headache, not intractable 08/14/2023   Bursitis of left hip 07/12/2023   GAD (generalized anxiety disorder) 02/21/2023   Closed nondisplaced fracture of sixth cervical vertebra with routine healing 08/08/2022   Acute renal insufficiency 08/08/2022   Diabetic mononeuropathy associated with type 2 diabetes mellitus (HCC) 05/19/2022   Accidental fall 03/10/2022   Hypomania (HCC) 02/10/2022   Weight loss 02/10/2022   Insomnia 08/16/2021   Iron deficiency anemia 08/16/2021   OAB (overactive bladder) 08/16/2021   Fire ant bite 08/19/2020   Hiatal hernia with GERD 01/21/2020   S/P laparoscopic fundoplication 01/21/2020   Globus sensation    Family history of breast cancer    History of colon polyps    Chronic diarrhea 01/14/2019   Malignant melanoma (HCC) 11/23/2016   Urge incontinence 11/23/2016   Osteopenia 09/15/2014   History of TIA (transient ischemic attack) 05/19/2014   Obesity (BMI 30-39.9) 10/08/2013   OA (osteoarthritis) of knee 07/07/2013   Chronic low back pain 05/09/2013   Obstructive sleep apnea 05/09/2013   GERD (gastroesophageal reflux disease) 05/09/2013   Hiatal hernia 05/09/2013   Fibromyalgia syndrome 05/09/2013   MDD (major depressive disorder), recurrent  episode, moderate (HCC) 05/09/2013   RLS (restless legs syndrome) 05/09/2013   Type 2 diabetes mellitus with vascular disease (HCC) 05/09/2013   Hyperlipidemia associated with type 2 diabetes mellitus (HCC) 05/09/2013   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  Pt accompanied by: self  PERTINENT HISTORY: Per Kaitlin Paich's note, 02/06/24, Dear Ms. [facility], It was our pleasure to participate in your care. We have typed up brief summary of what we discussed.  1. Unsteady gait - high falls risk  - no signs and symptoms suggestive of injury since 12/03/2023. Strict Emergency Room precautions   2. 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 Headaches - tension type versus hypertensive versus migraine with aura, improving  MRI Brain without contrast + NeuroQuant - 1. No reversible cause for symptoms. Mild chronic small vessel ischemia in the cerebral white matter. 2. Brain atrophy with NeuroQuant volumetric analysis of the brain,  see details on [employer].   Labs - ESR, CRP, CMP 10/17/2023 - satisfactory  4. Leg Length Discrepancy  X-ray hip with pelvis 10/09/2023 - unremarkable   5. Cognitive Impairment  Reports concerns about memory and forgetfulness. At 83 years old, worried about cognitive decline. No history of head injury or neurological symptoms. Agreed to perform bedside memory test and brain scan with NeuroQuant  without contrast to assess brain volume. Discussed the importance of mentally stimulating activities, good sunlight exposure, and dietary changes to improve cognition. - MRI Brain reviewed as above   6. Unintentional Weight Loss, 15 lbs in 3 months - stablized  Lost 15 pounds unintentionally. Denies trouble swallowing, hematuria, melena, chest pain, shortness of breath, lightheadedness, dizziness, neck pain, and lower back pain. Reports daytime sweats for the past six months. Recent lab work and x-ray of pelvis and lower back were satisfactory. Discussed potential causes including thyroid  dysfunction and anemia. Agreed to check thyroid  function, CBC, iron levels, and urine analysis. Referred to gastroenterology and primary care for further evaluation. Encouraged to use meal supplement shakes and monitor weight at home. - Labs reviewed: CBC, CMP, Iron, Ferritin, TSH, Urinalysis, Culture - Labs are okay with minor variations.   - follow-up with primary care provider  - Encourage meal supplement shakes and monitor weight at home Assessment & Plan 7. Recent Fall, without head strike, no new symptoms, no signs and symptoms suggestive of injury  Experienced a fall last night after turning too quickly and losing balance. Reports muscle tightness and soreness but denies chest pain, shortness of breath, head injury, loss of consciousness, or bruising. No new symptoms such as back pain, changes in bowel or bladder control, headaches, nausea, or vomiting. Physical exam revealed no bruising or tenderness in the ribs or lower back. Slight unbalance noted during walking test, but no lightheadedness or dizziness. x-rays of thoracic spine 12/11/2023 - Thoracic compression fracture and lumbar spine - Multilevel degenerative changes. No acute findings   8. Thoracic Compression Fracture  -Per orthopedics. MRIs are pending  9. General Health Maintenance Encouraged to maintain a healthy diet, sleep well, and engage in  mentally stimulating activities. Advised to see ENT or primary care for ear cleaning to address blocked ear and potential equilibrium issues. - Recommend seeing ENT or primary care for ear cleaning to address blocked ear and potential equilibrium issue  DIAGNOSTIC FINDINGS: MRI brain, 12/14/23, FINDINGS: Brain: No acute infarction, hemorrhage, hydrocephalus, extra-axial collection or mass lesion. Chronic small vessel ischemic gliosis in the cerebral white matter is mild for age. No chronic blood products or abnormal mineralization.   Vascular: Normal flow voids.   Skull and upper cervical spine: Normal marrow signal.   Sinuses/Orbits: Negative   NeuroQuant Findings:   Volumetric analysis of the brain was performed, with a fully detailed report in [employer]. Briefly, the comparison with age and gender matched reference reveals reasonable tagging cortex. Whole brain volume is low normal. The bilateral hippocampus is less than expected.   IMPRESSION: 1. No reversible cause for symptoms. Mild chronic small vessel ischemia in the cerebral white matter. 2. Brain atrophy with NeuroQuant volumetric analysis of the brain, see details on [employer].  PAIN:  Are you having pain? No   FALLS: Has patient fallen in last 6 months?  Yes  LIVING ENVIRONMENT: Lives with: lives with their spouse and in independent living apartment at [facility]   PLOF:  Level of assistance: Independent with ADLs Employment: Retired   PATIENT GOALS   for memory and communication to improve  OBJECTIVE:   TODAY'S TREATMENT:  Reviewed compensations  for anomia, attention, memory, and executive functioning as discussed in previous sessions. Pt reported successful implementation of >5 strategies. Pt endorsed feeling more confident and comfortable with CLOF.   Reviewed ways to promote cognitive-linguistic functioning at d/c (e.g. diet, activity, sleep, structure, etc).    PATIENT  EDUCATION: Education details: as above Person educated: Patient Education method: Programmer, multimedia;  Education comprehension: verbalized understanding  HOME EXERCISE PROGRAM:        Continue implementation of recommended strategies    GOALS:  Goals reviewed with patient? Yes  SHORT TERM GOALS: Target date: 10 sessions  With Min A, patient will complete a semantic feature analysis with at least 3 relevant features for 8/10 target words to improve word-finding skills.   Baseline: Goal status: MET   2.  Pt will ID 2-3 strategies for improved attention/memory with min A. Baseline:  Goal status: MET  3.  Pt will report implementation of 2-3 strategies for improved attention and memory.  Baseline:  Goal status: MET  LONG TERM GOALS: Target date: 12 weeks  Pt will utilize compensatory strategies for anomia with modified independence during informal conversational exchanges. Baseline:  Goal status: MET  2.  Pt will demonstrate knowledge of appropriate of appropriate activities to promote cognitive-linguistic functioning outside of therapy. Baseline:  Goal status: MET  3.  Pt will report improved memory per PROM. Baseline: Low - 20 to 29 Memory Satisfaction (T-score: 37) Below Average - 30-39 Memory Ability (T-score: 39) Average - 40 to 60 use of Memory Strategies (T-score: 53) Goal status: MET   ASSESSMENT:  CLINICAL IMPRESSION:  Patient is a 83 y.o. female who was seen today for cognitive-linguistic tx. See details above. Pt participated in a course of ST targeting mild cognitive-linguistic deficits. Pt with successful implementation of several strategies to improve cognitive-linguistic functioning and with improved satisfaction re: CLOF based on PROM and subjective report. Pt has done well in ST and has met all goals. Pt is now d/c'd from ST.     PLAN: D/C ST services   Delon Bangs, M.S., CCC-SLP Speech-Language Pathologist [facility] [phone] [place])   [facility] Outpatient Rehabilitation at [facility] [address] [place], [place], [address] Phone: [phone]   Fax:  [phone]

## 2024-05-14 ENCOUNTER — Ambulatory Visit

## 2024-05-19 ENCOUNTER — Ambulatory Visit: Admitting: Urology

## 2024-05-19 ENCOUNTER — Ambulatory Visit

## 2024-05-19 VITALS — BP 145/63 | HR 65 | Ht 63.0 in | Wt 127.0 lb

## 2024-05-19 DIAGNOSIS — N3946 Mixed incontinence: Secondary | ICD-10-CM | POA: Diagnosis not present

## 2024-05-19 LAB — MICROSCOPIC EXAMINATION

## 2024-05-19 LAB — URINALYSIS, COMPLETE
Bilirubin, UA: NEGATIVE
Ketones, UA: NEGATIVE
Leukocytes,UA: NEGATIVE
Nitrite, UA: NEGATIVE
Protein,UA: NEGATIVE
RBC, UA: NEGATIVE
Specific Gravity, UA: 1.015 (ref 1.005–1.030)
Urobilinogen, Ur: 0.2 mg/dL (ref 0.2–1.0)
pH, UA: 6 (ref 5.0–7.5)

## 2024-05-19 NOTE — Progress Notes (Signed)
 05/19/2024 9:30 AM   Morgan [name] 26-Oct-1941 [id number]  Referring provider: Avelina Greig BRAVO, MD [address] [place],  [place] [address]  Chief Complaint  Patient presents with   discuss interstim    HPI: Was consulted to assess the patient's urinary incontinence.  She has urge incontinence.  Sometimes leaks with coughing sneezing but not bending lifting.  No bedwetting.  Wears 1 pad a day that is damp.  She failed Myrbetriq  and Detrol     she voids every 3-4 hours gets up once or twice a night.  She reports good flow   She has had a stroke and is on oral hypoglycemics.  Has had a hysterectomy   Patient has mild mixed incontinence. Has failed 2 medications. He is primarily bothered by her incontinence. Call if culture positive. Perform cystoscopy in 6 weeks on Gemtesa  samples and prescription. I think it would be okay to treat her without urodynamics and percutaneous tibial nerve stimulation based on age and severity may be an excellent option for her.    Today Frequency stable.  Last culture negative Moderate improvement with good days and bad days on Gemtesa  Cystoscopy: Patient underwent flexible cystoscopy.  Bladder mucosa and trigone were normal.  No cystitis.  No carcinoma On pelvic examination she had no prolapse with a mild narrowing of the introitus.  No stress incontinence after cystoscopy    Patient has tried 3 medications for mild overactive bladder.  Role of percutaneous tibial nerve stimulation and physical therapy also discussed.  She would like to do percutaneous tibial nerve stimulation and handout given.  She will stay on the Gemtesa  and can always go off it if she does really well.   Today Frequency stable.  Patient saw extender June 2025.  She failed Myrbetriq  and Detrol  and was Gemtesa .  She decided not to do percutaneous tibial nerve stimulation.  InterStim was discussed also for fecal incontinence.  Patient has gushing urgency incontinence X was just  that she gets to the restroom.  She leaks a little bit with coughing sneezing intermittently.  Sometimes she has bedwetting.  She wears 1 pad a day.  She gets cramps and then loses bowel control.  She is on daily aspirin .  No pacemaker.  No blood thinner.  She is quite frustrated with her symptoms and have given a lot of thought.  She has a cardiac history and sleep apnea history.  Her husband has had some issues with stage IV prostate cancer that she discussed with me today   PMH: Past Medical History:  Diagnosis Date   Angina    Arthritis    Atrial fibrillation (HCC)    ASPIRIN  FOR BLOOD THINNER   Atypical mole 09/22/2016   Bulging discs    lumbar    Complication of anesthesia    pt states has choking sensation with ET tube    Coronary artery disease    Depression    Diabetes mellitus    diet controlled/on meds   Family history of breast cancer    Family history of colon cancer    Fibromyalgia    GERD (gastroesophageal reflux disease)    H/O hiatal hernia    Headache(784.0)    recurring   High cholesterol    History of colon polyps    Hyperlipidemia    Hypertension    Jackhammer esophagus    Melanoma (HCC)    melanoma   Migraines    til ~ 1980   PONV (postoperative nausea  and vomiting)    Restless leg syndrome    Sciatic nerve pain    from pinched nerve   Sleep apnea    uses CPAP   Stroke (HCC) 2014   no deficits   Weakness of right side of body    I've had PT for it; they don't know what it's from.  CORTISONE INJECTION INTO BACK 08/30/12    Surgical History: Past Surgical History:  Procedure Laterality Date   28 HOUR PH STUDY N/A 12/29/2015   Procedure: 24 HOUR PH STUDY;  Surgeon: Elspeth Deward Naval, MD;  Location: [facility];  Service: Gastroenterology;  Laterality: N/A;   CARPAL TUNNEL RELEASE Left 07/02/2015   Procedure: CARPAL TUNNEL RELEASE;  Surgeon: Dempsey Sensor, MD;  Location: [facility];  Service: Orthopedics;  Laterality:  Left;   CATARACT EXTRACTION, BILATERAL Bilateral    Oct and Nov 2017   COLONOSCOPY  06/30/2019   CORONARY ANGIOPLASTY WITH STENT PLACEMENT  06/2011   1   CORONARY ARTERY BYPASS GRAFT  2005   CABG X 2   DILATION AND CURETTAGE OF UTERUS     more than once   ELBOW ARTHROSCOPY Left 07/02/2015   Procedure: ARTHROSCOPY LEFT ELBOW WITH DEBRIDEMENT AND REMOVAL LOOSE BODY;  Surgeon: Dempsey Sensor, MD;  Location: [facility];  Service: Orthopedics;  Laterality: Left;   ESOPHAGEAL DILATION  01/21/2020   Procedure: ESOPHAGEAL DILATION;  Surgeon: San Sandor GAILS, DO;  Location: [facility];  Service: Gastroenterology;;   ESOPHAGEAL MANOMETRY N/A 12/29/2015   Procedure: ESOPHAGEAL MANOMETRY (EM);  Surgeon: Elspeth Deward Naval, MD;  Location: [facility];  Service: Gastroenterology;  Laterality: N/A;   ESOPHAGEAL MANOMETRY N/A 07/30/2019   Procedure: ESOPHAGEAL MANOMETRY (EM);  Surgeon: Naval Elspeth SQUIBB, MD;  Location: [facility];  Service: Gastroenterology;  Laterality: N/A;   ESOPHAGOGASTRODUODENOSCOPY (EGD) WITH PROPOFOL  N/A 01/21/2020   Procedure: ESOPHAGOGASTRODUODENOSCOPY (EGD) WITH PROPOFOL ;  Surgeon: San Sandor GAILS, DO;  Location: [facility];  Service: Gastroenterology;  Laterality: N/A;   FRACTURE SURGERY  ~ 2005   nose   KNEE ARTHROSCOPY  09/04/2012   Procedure: ARTHROSCOPY KNEE;  Surgeon: Dempsey GAILS Moan, MD;  Location: [facility];  Service: Orthopedics;  Laterality: Right;  right knee arthroscopy with medial and lateral meniscus debridement   MELANOMA EXCISION Right 10/13/2016   right side of neck   MOUTH SURGERY  2004   bone replacement; had cadavear bones put in; face was collapsing   NASAL SEPTUM SURGERY  ~ 1986   TOTAL KNEE ARTHROPLASTY Right 07/07/2013   Procedure: RIGHT TOTAL KNEE ARTHROPLASTY;  Surgeon: Dempsey GAILS Moan, MD;  Location: [facility];  Service: Orthopedics;  Laterality: Right;   TRANSORAL INCISIONLESS FUNDOPLICATION N/A 01/21/2020   Procedure:  TRANSORAL INCISIONLESS FUNDOPLICATION;  Surgeon: San Sandor GAILS, DO;  Location: [facility];  Service: Gastroenterology;  Laterality: N/A;   UPPER GASTROINTESTINAL ENDOSCOPY      Home Medications:  Allergies as of 05/19/2024       Reactions   Hydrocodone -acetaminophen  Other (See Comments)   Changed personality made me very mean   Percocet [oxycodone -acetaminophen ] Other (See Comments)   hallucination   Sulfa Antibiotics Other (See Comments)   hallucinations        Medication List        Accurate as of May 19, 2024  9:30 AM. If you have any questions, ask your nurse or doctor.          albuterol  108 (90 Base) MCG/ACT inhaler Commonly known as: VENTOLIN   HFA Inhale 2 puffs into the lungs as needed for wheezing.   ALIGN PO Take by mouth at bedtime.   aspirin  EC 81 MG tablet Take 1 tablet (81 mg total) by mouth daily.   atorvastatin  80 MG tablet Commonly known as: LIPITOR  Take 1 tablet (80 mg total) by mouth daily.   Blink Tears 0.25 % Soln Generic drug: Polyethylene Glycol 400 Place 1 drop into both eyes daily as needed (dry/irritated eyes.).   calcium -vitamin D  500-200 MG-UNIT tablet Commonly known as: OSCAL WITH D Take 1 tablet by mouth 2 (two) times daily.   chlorthalidone  25 MG tablet Commonly known as: HYGROTON  TAKE 1 TABLET BY MOUTH EVERY DAY   CINNAMON PO Take 1,000 mg by mouth in the morning and at bedtime.   Citrucel oral powder Generic drug: methylcellulose Use as directed daily   cyanocobalamin  1000 MCG tablet Commonly known as: VITAMIN B12 Take 1,000 mcg by mouth daily.   empagliflozin  10 MG Tabs tablet Commonly known as: JARDIANCE  Take 1 tablet (10 mg total) by mouth daily before breakfast.   ferrous sulfate 325 (65 FE) MG tablet Take 325 mg by mouth daily.   freestyle lancets Use to check blood sugar twice daily   gabapentin  300 MG capsule Commonly known as: NEURONTIN  TAKE ONE CAPSULE IN THE MORNING, ONE CAPSULE IN THE  EVENING AND 3 CAPSULES AT BEDTIME.   Lancet Device Misc Use to check blood sugar twice daily.   losartan  50 MG tablet Commonly known as: COZAAR  Take 1 tablet (50 mg total) by mouth daily.   MAALOX REGULAR STRENGTH PO Take by mouth daily as needed. Patient takes 1 capful daily of the powder formulation.   metFORMIN  500 MG 24 hr tablet Commonly known as: GLUCOPHAGE -XR TAKE 2 TABLETS BY MOUTH TWICE A DAY   metoprolol  succinate 100 MG 24 hr tablet Commonly known as: TOPROL -XL TAKE 1 TABLET BY MOUTH EVERY DAY WITH OR IMMEDIATELY FOLLOWING A MEAL   mirabegron  ER 50 MG Tb24 tablet Commonly known as: Myrbetriq  Take 1 tablet (50 mg total) by mouth daily.   nitroGLYCERIN  0.4 MG SL tablet Commonly known as: NITROSTAT  PLACE 1 TABLET (0.4 MG TOTAL) UNDER THE TONGUE EVERY 5 (FIVE) MINUTES AS NEEDED FOR CHEST PAIN   OneTouch Ultra test strip Generic drug: glucose blood USE TO CHECK BLOOD SUGAR TWICE DAILY   PRESERVISION AREDS PO Take 1 tablet by mouth 2 (two) times daily. Reported on 12/23/2015   QUEtiapine  25 MG tablet Commonly known as: SEROQUEL  TAKE 1 TABLET BY MOUTH EVERYDAY AT BEDTIME   rOPINIRole  1 MG tablet Commonly known as: REQUIP  1/2 TABLET DURING THE DAY, 2 TABLETS AT BEDTIME   venlafaxine  XR 75 MG 24 hr capsule Commonly known as: EFFEXOR -XR TAKE 1 CAPSULE BY MOUTH DAILY WITH BREAKFAST.        Allergies:  Allergies  Allergen Reactions   Hydrocodone -Acetaminophen  Other (See Comments)    Changed personality made me very mean   Percocet [Oxycodone -Acetaminophen ] Other (See Comments)    hallucination   Sulfa Antibiotics Other (See Comments)    hallucinations    Family History: Family History  Problem Relation Age of Onset   Breast cancer Mother 22   Heart disease Father 15       sudden onset due to CAD   Hypertension Sister    Dementia Sister    Diabetes Sister    Cancer - Colon Sister 94   Colon cancer Sister    GER disease Daughter    Hypertension  Daughter    Heart disease Brother    Hypertension Brother    Heart attack Neg Hx    Stroke Neg Hx    Esophageal cancer Neg Hx    Rectal cancer Neg Hx    Stomach cancer Neg Hx     Social History:  reports that she has never smoked. She has never used smokeless tobacco. She reports current alcohol use of about 1.0 standard drink of alcohol per week. She reports that she does not use drugs.  ROS:                                        Physical Exam: BP (!) 145/63   Pulse 65   Ht 5' 3 (1.6 m)   Wt 57.6 kg   BMI 22.50 kg/m   Constitutional:  Alert and oriented, No acute distress. HEENT: [place] AT, moist mucus membranes.  Trachea midline, no masses.  Laboratory Data: Lab Results  Component Value Date   WBC 10.7 (H) 01/18/2024   HGB 14.7 01/18/2024   HCT 44.1 01/18/2024   MCV 92.7 01/18/2024   PLT 417.0 (H) 01/18/2024    Lab Results  Component Value Date   CREATININE 1.03 03/13/2024    No results found for: PSA  No results found for: TESTOSTERONE  Lab Results  Component Value Date   HGBA1C 8.8 (H) 02/19/2024    Urinalysis    Component Value Date/Time   COLORURINE YELLOW 07/01/2013 1247   APPEARANCEUR Clear 04/25/2024 0945   LABSPEC 1.022 07/01/2013 1247   PHURINE 6.0 07/01/2013 1247   GLUCOSEU 3+ (A) 04/25/2024 0945   HGBUR NEGATIVE 07/01/2013 1247   BILIRUBINUR Negative 04/25/2024 0945   KETONESUR NEGATIVE 07/01/2013 1247   PROTEINUR Negative 04/25/2024 0945   PROTEINUR NEGATIVE 07/01/2013 1247   UROBILINOGEN 0.2 07/08/2020 1109   UROBILINOGEN 1.0 07/01/2013 1247   NITRITE Negative 04/25/2024 0945   NITRITE NEGATIVE 07/01/2013 1247   LEUKOCYTESUR Negative 04/25/2024 0945    Pertinent Imaging: Urine reviewed and sent for culture  Assessment & Plan: I reviewed the entire course with her and she still on Gemtesa .  She is not can do Botox.  She would like to do sacral nerve stimulation.  Pros cons risks and full template utilized.   Agent discussed.  Doing the test in [place] under local anesthesia was discussed.  Then if she needs an anesthetic for the implant it could be done locally or [facility] pending where she would like.  Site of service discussed.  I did not order urodynamics.  1. Mixed incontinence (Primary)  - Urinalysis, Complete   No follow-ups on file.  Glendia DELENA Elizabeth, MD  [facility] [address] [place], [place] [address] [phone]

## 2024-05-21 ENCOUNTER — Other Ambulatory Visit: Payer: Self-pay | Admitting: Licensed Clinical Social Worker

## 2024-05-21 ENCOUNTER — Ambulatory Visit

## 2024-05-21 ENCOUNTER — Other Ambulatory Visit: Payer: Self-pay

## 2024-05-21 NOTE — Patient Instructions (Signed)
 Visit Information  Thank you for taking time to visit with me today. Please don't hesitate to contact me if I can be of assistance to you before our next scheduled appointment.  Our next appointment is by telephone on 07/01/24 at 2:00 PM   Please call the care guide team at [phone] if you need to cancel or reschedule your appointment.   Following is a copy of your care plan:   Problems:   Caregiving Stress related to needs of her spouse Her husband is a veteran Field seismologist) and goes periodically to [employer] in [place], [place]             Client uses  multiple medications and is managing her medications prescribed             Depression possibly              Some stress in managing daily activities                           CSW Clinical Goal(s):  Over the next 30 days the Patient will use coping skills learned to manage stress issues faced AEB client reduction in stress indicators  Over next 30 days client will attend scheduled medical appointments AEB documentation in EPIC record  .  Interventions:  Discussed needs of client's spouse             Discussed client use of prescribed medications. She takes numerous medication and uses pill box to help her take meds at correct times            Discussed medication costs             Discussed ambulation. She is not using a walking device            Discussed mood of client. Trying to manage stress issues faced. Using coping skills            Discussed client apartment with her spouse at [facility] facility in [place], [place]             Discussed client support with PCP             Discussed vision of client. She wears glasses             Discussed fact that client has challenges with incontinence. Discussed supplies she uses and costs of supplies           Encouraged client to call LCSW as needed for SW support  Patient Goals/Self-Care Activities:  Attend medical appointments as scheduled            Take medications as  prescribed            Help manage daily needs of her spouse            Allow time for self care and for ADLs completion  Plan:   Telephone follow up appointment with care management team member scheduled for:  07/01/24 at 2:00 PM      Please go to [facility] [address] [place] [phone]) if you are experiencing a Mental Health or Behavioral Health Crisis or need someone to talk to.  The patient verbalized understanding of instructions, educational materials, and care plan provided today and DECLINED offer to receive copy of patient instructions, educational materials, and care plan.    Glendia Pear  MSW, LCSW [place]/[facility]  Licensed Environmental health practitioner Dial:  [phone] Fax:  [phone] Website:  [email or web address]

## 2024-05-21 NOTE — Patient Outreach (Signed)
 Complex Care Management   Visit Note  05/21/2024  Name:  Morgan [name] MRN: [id number] DOB: [birth year]  Situation: Referral received for Complex Care Management related to depression management  I obtained verbal consent from Patient.  Visit completed with patient  on the phone  Background:   Past Medical History:  Diagnosis Date   Angina    Arthritis    Atrial fibrillation (HCC)    ASPIRIN  FOR BLOOD THINNER   Atypical mole 09/22/2016   Bulging discs    lumbar    Complication of anesthesia    pt states has choking sensation with ET tube    Coronary artery disease    Depression    Diabetes mellitus    diet controlled/on meds   Family history of breast cancer    Family history of colon cancer    Fibromyalgia    GERD (gastroesophageal reflux disease)    H/O hiatal hernia    Headache(784.0)    recurring   High cholesterol    History of colon polyps    Hyperlipidemia    Hypertension    Jackhammer esophagus    Melanoma (HCC)    melanoma   Migraines    til ~ 1980   PONV (postoperative nausea and vomiting)    Restless leg syndrome    Sciatic nerve pain    from pinched nerve   Sleep apnea    uses CPAP   Stroke (HCC) 2014   no deficits   Weakness of right side of body    I've had PT for it; they don't know what it's from.  CORTISONE INJECTION INTO BACK 08/30/12    Assessment: Patient Reported Symptoms:  Cognitive Cognitive Status: Alert and oriented to person, place, and time   Health Maintenance Behaviors: Sleep adequate  Neurological Neurological Review of Symptoms: Weakness, Headaches Neurological Management Strategies: Adequate rest, Coping strategies  HEENT HEENT Symptoms Reported:  (wears glasses) HEENT Management Strategies: Adequate rest, Coping strategies    Cardiovascular Cardiovascular Symptoms Reported: Fatigue Does patient have uncontrolled Hypertension?: Yes Cardiovascular Management Strategies: Coping strategies  Respiratory Respiratory  Symptoms Reported: No symptoms reported Respiratory Management Strategies: Adequate rest  Endocrine Endocrine Symptoms Reported: Weakness or fatigue, Increased urination Is patient diabetic?: Yes    Gastrointestinal Gastrointestinal Symptoms Reported: Incontinence Gastrointestinal Management Strategies: Coping strategies    Genitourinary Genitourinary Symptoms Reported: Frequency Additional Genitourinary Details: incontinent occasionally Genitourinary Management Strategies: Coping strategies  Integumentary Integumentary Symptoms Reported: No symptoms reported (recent visit with Dermatologist) Skin Management Strategies: Coping strategies  Musculoskeletal Musculoskelatal Symptoms Reviewed: Weakness Musculoskeletal Management Strategies: Coping strategies Falls in the past year?: No    Psychosocial Psychosocial Symptoms Reported: Depression - if selected complete PHQ 2-9, Sadness - if selected complete PHQ 2-9 Behavioral Management Strategies: Coping strategies Major Change/Loss/Stressor/Fears (CP): Medical condition, self Techniques to Cope with Loss/Stress/Change: Counseling Quality of Family Relationships: supportive Do you feel physically threatened by others?: No      05/21/2024    9:56 AM  Depression screen PHQ 2/9  Decreased Interest 1  Down, Depressed, Hopeless 1  PHQ - 2 Score 2  Altered sleeping 1  Tired, decreased energy 1  Change in appetite 1  Feeling bad or failure about yourself  1  Trouble concentrating 0  Moving slowly or fidgety/restless 1  Suicidal thoughts 0  PHQ-9 Score 7  Difficult doing work/chores Somewhat difficult    Vitals:   BP in normal range per client  Medications Reviewed Today     Reviewed by Frances,  Ozell RAMAN, LCSW (Social Worker) on 05/21/24 at 1001  Med List Status: <None>   Medication Order Taking? Sig Documenting Provider Last Dose Status Informant  albuterol  (VENTOLIN  HFA) 108 (90 Base) MCG/ACT inhaler [id number] Yes Inhale 2 puffs  into the lungs as needed for wheezing. [provider]  Active   Alum & Mag Hydroxide-Simeth (MAALOX REGULAR STRENGTH PO) [id number] Yes Take by mouth daily as needed. Patient takes 1 capful daily of the powder formulation. [provider]  Active Self  aspirin  EC 81 MG tablet [id number] Yes Take 1 tablet (81 mg total) by mouth daily. Jeffrie Oneil BROCKS, MD  Active Self  atorvastatin  (LIPITOR ) 80 MG tablet [id number] Yes Take 1 tablet (80 mg total) by mouth daily. Jeffrie Oneil BROCKS, MD  Active   BLINK TEARS 0.25 % LARRAINE [id number] Yes Place 1 drop into both eyes daily as needed (dry/irritated eyes.). [provider]  Active Self  calcium -vitamin D  (OSCAL WITH D) 500-200 MG-UNIT tablet [id number] Yes Take 1 tablet by mouth 2 (two) times daily. [provider]  Active Self  chlorthalidone  (HYGROTON ) 25 MG tablet [id number] Yes TAKE 1 TABLET BY MOUTH EVERY DAY Bedsole, Amy E, MD  Active   CINNAMON PO [id number] Yes Take 1,000 mg by mouth in the morning and at bedtime. [provider]  Active Self  empagliflozin  (JARDIANCE ) 10 MG TABS tablet [id number] Taking differently Take 1 tablet (10 mg total) by mouth daily before breakfast. Avelina, Amy E, MD  Active               ferrous sulfate 325 (65 FE) MG tablet [id number] Yes Take 325 mg by mouth daily. [provider]  Active   gabapentin  (NEURONTIN ) 300 MG capsule [id number] Yes TAKE ONE CAPSULE IN THE MORNING, ONE CAPSULE IN THE EVENING AND 3 CAPSULES AT BEDTIME. Avelina Greig BRAVO, MD  Active   Lancet Device MISC [id number] Yes Use to check blood sugar twice daily. Avelina Greig BRAVO, MD  Active Self  Lancets (FREESTYLE) lancets [id number] Yes Use to check blood sugar twice daily Bedsole, Amy E, MD  Active Self  losartan  (COZAAR ) 50 MG tablet [id number] Yes Take 1 tablet (50 mg total) by mouth daily. Avelina Greig BRAVO, MD  Active   metFORMIN  (GLUCOPHAGE -XR) 500 MG 24 hr tablet [id number] Yes TAKE 2 TABLETS BY MOUTH TWICE A DAY  Bedsole, Amy E, MD  Active   methylcellulose (CITRUCEL) oral powder [id number] Yes Use as directed daily Armbruster, Elspeth SQUIBB, MD  Active              metoprolol  succinate (TOPROL -XL) 100 MG 24 hr tablet [id number] Yes TAKE 1 TABLET BY MOUTH EVERY DAY WITH OR IMMEDIATELY FOLLOWING A MEAL Bedsole, Amy E, MD  Active   mirabegron  ER (MYRBETRIQ ) 50 MG TB24 tablet [id number] Yes Take 1 tablet (50 mg total) by mouth daily. Avelina Greig BRAVO, MD  Active   Multiple Vitamins-Minerals (PRESERVISION AREDS PO) [id number] Yes Take 1 tablet by mouth 2 (two) times daily. Reported on 12/23/2015 [provider]  Active Self  nitroGLYCERIN  (NITROSTAT ) 0.4 MG SL tablet [id number] Yes PLACE 1 TABLET (0.4 MG TOTAL) UNDER THE TONGUE EVERY 5 (FIVE) MINUTES AS NEEDED FOR CHEST PAIN Lucien Orren SAILOR, PA-C  Active               [facility] ULTRA test strip [id number] Yes USE TO CHECK BLOOD SUGAR TWICE DAILY Avelina Greig BRAVO, MD  Active Self  Probiotic Product (ALIGN PO) [id number] Yes Take by  mouth at bedtime. [provider]  Active   QUEtiapine  (SEROQUEL ) 25 MG tablet [id number] Yes TAKE 1 TABLET BY MOUTH EVERYDAY AT BEDTIME Bedsole, Amy E, MD  Active   rOPINIRole  (REQUIP ) 1 MG tablet [id number] Yes 1/2 TABLET DURING THE DAY, 2 TABLETS AT BEDTIME Bedsole, Amy E, MD  Active   venlafaxine  XR (EFFEXOR -XR) 75 MG 24 hr capsule [id number] Yes TAKE 1 CAPSULE BY MOUTH DAILY WITH BREAKFAST. Avelina Greig BRAVO, MD  Active   vitamin B-12 (CYANOCOBALAMIN ) 1000 MCG tablet [id number] Yes Take 1,000 mcg by mouth daily. [provider]  Active             Recommendation:   PCP Follow-up Continue Current Plan of Care Take medications as prescribed Call LCSW as needed for SW support  Follow Up Plan:   Telephone follow up appointment date/time:  07/01/24 at 2:00 PM    Glendia Pear  MSW, LCSW [place]/Value Based Care [facility] Licensed Clinical Social Worker Direct Dial:  [phone] Fax:   [phone] Website:  [email or web address]

## 2024-05-22 ENCOUNTER — Telehealth: Payer: Self-pay | Admitting: Cardiology

## 2024-05-22 NOTE — Telephone Encounter (Signed)
   Pre-operative Risk Assessment    Patient Name: Morgan [name]  DOB: 1940-12-29 MRN: [id number]   Date of last office visit:  Date of next office visit:    Request for Surgical Clearance {1. What type of surgery is being performed? Enter name of procedure below and number of teeth if dental extraction.  :  Procedure:  PNE {2. When is this surgery scheduled? Press F2 to enter date below and place date in Reason for Visit (see directions below). : Date of Surgery:  07-15-24                                 Surgeon:  Dr Susa Surgeon's Group or Practice Name:   Phone number:  [phone] Fax number:  [phone]   Type of Clearance Requested:   - Medical  - Pharmacy:  Hold Aspirin  for 5 days   Type of Anesthesia:  Local    Additional requests/questions:    SignedKyra CHRISTELLA Bull   05/22/2024, 9:21 AM

## 2024-05-23 NOTE — Telephone Encounter (Signed)
1st attempt to reach pt regarding surgical clearance and the need for an IN OFFICE appointment.  Left pt a detailed message to call back and get that scheduled.

## 2024-05-23 NOTE — Telephone Encounter (Signed)
   Name: Morgan [name]  DOB: 1941/07/02  MRN: [id number]  Primary Cardiologist: Oneil Parchment, MD  Chart reviewed as part of pre-operative protocol coverage. Because of [name]'s past medical history and time since last visit, she will require a follow-up in-office visit in order to better assess preoperative cardiovascular risk.  Pre-op covering staff: - Please schedule appointment and call patient to inform them. If patient already had an upcoming appointment within acceptable timeframe, please add pre-op clearance to the appointment notes so provider is aware. - Please contact requesting surgeon's office via preferred method (i.e, phone, fax) to inform them of need for appointment prior to surgery.   [name], [place]  05/23/2024, 1:41 PM

## 2024-05-24 ENCOUNTER — Other Ambulatory Visit: Payer: Self-pay | Admitting: Family Medicine

## 2024-05-26 ENCOUNTER — Ambulatory Visit

## 2024-05-26 ENCOUNTER — Other Ambulatory Visit: Payer: Self-pay | Admitting: Family Medicine

## 2024-05-26 NOTE — Telephone Encounter (Signed)
 Pt scheduled for OV on 07/04/24.

## 2024-05-26 NOTE — Telephone Encounter (Signed)
 Last office visit 03/13/2024 for HTN, DM, OAB and Insomnia.  Last refilled 04/22/2024 for #150 with no refills.  Next Appt: 06/13/24 for DM.

## 2024-05-27 DIAGNOSIS — M47816 Spondylosis without myelopathy or radiculopathy, lumbar region: Secondary | ICD-10-CM | POA: Diagnosis not present

## 2024-05-27 DIAGNOSIS — M48062 Spinal stenosis, lumbar region with neurogenic claudication: Secondary | ICD-10-CM | POA: Diagnosis not present

## 2024-05-28 ENCOUNTER — Ambulatory Visit

## 2024-05-28 DIAGNOSIS — R2689 Other abnormalities of gait and mobility: Secondary | ICD-10-CM | POA: Diagnosis not present

## 2024-05-28 DIAGNOSIS — G2581 Restless legs syndrome: Secondary | ICD-10-CM | POA: Diagnosis not present

## 2024-05-28 DIAGNOSIS — Z1331 Encounter for screening for depression: Secondary | ICD-10-CM | POA: Diagnosis not present

## 2024-05-30 DIAGNOSIS — M48062 Spinal stenosis, lumbar region with neurogenic claudication: Secondary | ICD-10-CM | POA: Diagnosis not present

## 2024-05-30 DIAGNOSIS — M47816 Spondylosis without myelopathy or radiculopathy, lumbar region: Secondary | ICD-10-CM | POA: Diagnosis not present

## 2024-06-11 ENCOUNTER — Ambulatory Visit: Payer: Self-pay | Admitting: Oncology

## 2024-06-11 ENCOUNTER — Inpatient Hospital Stay: Attending: Oncology | Admitting: Oncology

## 2024-06-11 ENCOUNTER — Encounter: Payer: Self-pay | Admitting: Oncology

## 2024-06-11 ENCOUNTER — Inpatient Hospital Stay

## 2024-06-11 VITALS — BP 149/65 | HR 59 | Temp 97.6°F | Resp 18 | Ht 63.0 in | Wt 129.4 lb

## 2024-06-11 DIAGNOSIS — G2581 Restless legs syndrome: Secondary | ICD-10-CM

## 2024-06-11 DIAGNOSIS — Z800 Family history of malignant neoplasm of digestive organs: Secondary | ICD-10-CM | POA: Diagnosis not present

## 2024-06-11 DIAGNOSIS — E611 Iron deficiency: Secondary | ICD-10-CM | POA: Diagnosis not present

## 2024-06-11 DIAGNOSIS — Z85820 Personal history of malignant melanoma of skin: Secondary | ICD-10-CM | POA: Diagnosis not present

## 2024-06-11 LAB — CBC (CANCER CENTER ONLY)
HCT: 41.4 % (ref 36.0–46.0)
Hemoglobin: 13.3 g/dL (ref 12.0–15.0)
MCH: 30.9 pg (ref 26.0–34.0)
MCHC: 32.1 g/dL (ref 30.0–36.0)
MCV: 96.1 fL (ref 80.0–100.0)
Platelet Count: 275 K/uL (ref 150–400)
RBC: 4.31 MIL/uL (ref 3.87–5.11)
RDW: 12 % (ref 11.5–15.5)
WBC Count: 8 K/uL (ref 4.0–10.5)
nRBC: 0 % (ref 0.0–0.2)

## 2024-06-11 LAB — RETIC PANEL
Immature Retic Fract: 12.9 % (ref 2.3–15.9)
RBC.: 4.29 MIL/uL (ref 3.87–5.11)
Retic Count, Absolute: 63.9 K/uL (ref 19.0–186.0)
Retic Ct Pct: 1.5 % (ref 0.4–3.1)
Reticulocyte Hemoglobin: 34.8 pg (ref >27.9)

## 2024-06-11 LAB — IRON AND TIBC
Iron: 186 ug/dL — ABNORMAL HIGH (ref 28–170)
Saturation Ratios: 46 % — ABNORMAL HIGH (ref 10.4–31.8)
TIBC: 405 ug/dL (ref 250–450)
UIBC: 219 ug/dL

## 2024-06-11 LAB — FERRITIN: Ferritin: 22 ng/mL (ref 11–307)

## 2024-06-11 NOTE — Assessment & Plan Note (Signed)
  Lab Results  Component Value Date   HGB 13.3 06/11/2024   TIBC 405 06/11/2024   IRONPCTSAT 46 (H) 06/11/2024   FERRITIN 22 06/11/2024    Mildly elevated iron saturation, likely secondary to her being on oral iron supplementation. Ferritin 22, in patient with restless leg syndrome, recommend goal of ferritin >80. Patient has been on oral iron supplementation for years. I discussed about option IV Venofer treatments. I discussed about the potential risks including but not limited to allergic reactions/infusion reactions including anaphylactic reactions, diarrhea, phlebitis, high blood pressure, wheezing, SOB, skin rash, weight gain,dark urine, leg swelling, back pain, headache, nausea and fatigue, etc.  Patient agrees with the plan.  Recommend Venofer weekly x 2.

## 2024-06-11 NOTE — Addendum Note (Signed)
 Addended by: BABARA CALL on: 06/11/2024 05:44 PM   Modules accepted: Orders

## 2024-06-11 NOTE — Progress Notes (Signed)
 Hematology/Oncology Consult note [phone] [phone]        REFERRING PROVIDER: Avelina Greig BRAVO, MD   CHIEF COMPLAINTS/REASON FOR VISIT:  Evaluation of low iron   ASSESSMENT & PLAN:   RLS (restless legs syndrome)   Lab Results  Component Value Date   HGB 13.3 06/11/2024   TIBC 405 06/11/2024   IRONPCTSAT 46 (H) 06/11/2024   FERRITIN 22 06/11/2024    Mildly elevated iron saturation, likely secondary to her being on oral iron supplementation. Ferritin 22, in patient with restless leg syndrome, recommend goal of ferritin >80. Patient has been on oral iron supplementation for years. I discussed about option IV Venofer treatments. I discussed about the potential risks including but not limited to allergic reactions/infusion reactions including anaphylactic reactions, diarrhea, phlebitis, high blood pressure, wheezing, SOB, skin rash, weight gain,dark urine, leg swelling, back pain, headache, nausea and fatigue, etc.  Patient agrees with the plan.  Recommend Venofer weekly x 2.   Orders Placed This Encounter  Procedures   Ferritin    Standing Status:   Future    Number of Occurrences:   1    Expected Date:   06/11/2024    Expiration Date:   09/09/2024   Iron and TIBC    Standing Status:   Future    Number of Occurrences:   1    Expected Date:   06/11/2024    Expiration Date:   09/09/2024   CBC (Cancer Center Only)    Standing Status:   Future    Number of Occurrences:   1    Expected Date:   06/11/2024    Expiration Date:   09/09/2024   Retic Panel    Standing Status:   Future    Number of Occurrences:   1    Expected Date:   06/11/2024    Expiration Date:   09/09/2024   Follow-up in 3 months. All questions were answered. The patient knows to call the clinic with any problems, questions or concerns.  Zelphia Cap, MD, PhD [facility] Hematology Oncology 06/11/2024   HISTORY OF PRESENTING ILLNESS:   Morgan [name] is a  83 y.o.  female with  PMH listed below was seen in consultation at the request of  Avelina Greig BRAVO, MD  for evaluation of low iron. Patient has a history of restless leg syndrome. She follows up with neurology Dr. Maree.  She had blood work done in January 2025 which showed ferritin level 24.  Neurology referred patient to hematology oncology to further increase iron store. Patient takes ferrous sulfate 325 mg daily for past 4 years. She denies any rectal bleeding or change in bowel habits, abdominal pain. Patient is on aspirin  81 mg.   MEDICAL HISTORY:  Past Medical History:  Diagnosis Date   Angina    Arthritis    Atrial fibrillation (HCC)    ASPIRIN  FOR BLOOD THINNER   Atypical mole 09/22/2016   Bulging discs    lumbar    Complication of anesthesia    pt states has choking sensation with ET tube    Coronary artery disease    Depression    Diabetes mellitus    diet controlled/on meds   Family history of breast cancer    Family history of colon cancer    Fibromyalgia    GERD (gastroesophageal reflux disease)    H/O hiatal hernia    Headache(784.0)    recurring   High cholesterol    History of colon  polyps    Hyperlipidemia    Hypertension    Jackhammer esophagus    Melanoma (HCC)    melanoma   Migraines    til ~ 1980   PONV (postoperative nausea and vomiting)    Restless leg syndrome    Sciatic nerve pain    from pinched nerve   Sleep apnea    uses CPAP   Stroke (HCC) 2014   no deficits   Weakness of right side of body    I've had PT for it; they don't know what it's from.  CORTISONE INJECTION INTO BACK 08/30/12    SURGICAL HISTORY: Past Surgical History:  Procedure Laterality Date   34 HOUR PH STUDY N/A 12/29/2015   Procedure: 24 HOUR PH STUDY;  Surgeon: Elspeth Deward Naval, MD;  Location: [facility];  Service: Gastroenterology;  Laterality: N/A;   CARPAL TUNNEL RELEASE Left 07/02/2015   Procedure: CARPAL TUNNEL RELEASE;  Surgeon: Dempsey Sensor, MD;  Location: [facility];  Service: Orthopedics;  Laterality: Left;   CATARACT EXTRACTION, BILATERAL Bilateral    Oct and Nov 2017   COLONOSCOPY  06/30/2019   CORONARY ANGIOPLASTY WITH STENT PLACEMENT  06/2011   1   CORONARY ARTERY BYPASS GRAFT  2005   CABG X 2   DILATION AND CURETTAGE OF UTERUS     more than once   ELBOW ARTHROSCOPY Left 07/02/2015   Procedure: ARTHROSCOPY LEFT ELBOW WITH DEBRIDEMENT AND REMOVAL LOOSE BODY;  Surgeon: Dempsey Sensor, MD;  Location: [facility];  Service: Orthopedics;  Laterality: Left;   ESOPHAGEAL DILATION  01/21/2020   Procedure: ESOPHAGEAL DILATION;  Surgeon: San Sandor GAILS, DO;  Location: [facility];  Service: Gastroenterology;;   ESOPHAGEAL MANOMETRY N/A 12/29/2015   Procedure: ESOPHAGEAL MANOMETRY (EM);  Surgeon: Elspeth Deward Naval, MD;  Location: [facility];  Service: Gastroenterology;  Laterality: N/A;   ESOPHAGEAL MANOMETRY N/A 07/30/2019   Procedure: ESOPHAGEAL MANOMETRY (EM);  Surgeon: Naval Elspeth SQUIBB, MD;  Location: [facility];  Service: Gastroenterology;  Laterality: N/A;   ESOPHAGOGASTRODUODENOSCOPY (EGD) WITH PROPOFOL  N/A 01/21/2020   Procedure: ESOPHAGOGASTRODUODENOSCOPY (EGD) WITH PROPOFOL ;  Surgeon: San Sandor GAILS, DO;  Location: [facility];  Service: Gastroenterology;  Laterality: N/A;   FRACTURE SURGERY  ~ 2005   nose   KNEE ARTHROSCOPY  09/04/2012   Procedure: ARTHROSCOPY KNEE;  Surgeon: Dempsey GAILS Moan, MD;  Location: [facility];  Service: Orthopedics;  Laterality: Right;  right knee arthroscopy with medial and lateral meniscus debridement   MELANOMA EXCISION Right 10/13/2016   right side of neck   MOUTH SURGERY  2004   bone replacement; had cadavear bones put in; face was collapsing   NASAL SEPTUM SURGERY  ~ 1986   TOTAL KNEE ARTHROPLASTY Right 07/07/2013   Procedure: RIGHT TOTAL KNEE ARTHROPLASTY;  Surgeon: Dempsey GAILS Moan, MD;  Location: [facility];  Service: Orthopedics;  Laterality: Right;   TRANSORAL  INCISIONLESS FUNDOPLICATION N/A 01/21/2020   Procedure: TRANSORAL INCISIONLESS FUNDOPLICATION;  Surgeon: San Sandor GAILS, DO;  Location: [facility];  Service: Gastroenterology;  Laterality: N/A;   UPPER GASTROINTESTINAL ENDOSCOPY      SOCIAL HISTORY: Social History   Socioeconomic History   Marital status: Married    Spouse name: Not on file   Number of children: 2   Years of education: college   Highest education level: Not on file  Occupational History   Occupation: Retired   Tobacco Use   Smoking status: Never   Smokeless tobacco: Never  Advertising account planner  Vaping status: Never Used  Substance and Sexual Activity   Alcohol use: Not Currently    Alcohol/week: 1.0 standard drink of alcohol    Types: 1 Glasses of wine per week    Comment: occasionally drink wine   Drug use: No   Sexual activity: Yes  Other Topics Concern   Not on file  Social History Narrative   Drinks 1 cup of coffee a day    Social Drivers of Corporate investment banker Strain: Low Risk  (05/09/2024)   Received [facility]   Overall Financial Resource Strain (CARDIA)    Difficulty of Paying Living Expenses: Not very hard  Food Insecurity: No Food Insecurity (06/11/2024)   Hunger Vital Sign    Worried About Running Out of Food in the Last Year: Never true    Ran Out of Food in the Last Year: Never true  Transportation Needs: No Transportation Needs (06/11/2024)   PRAPARE - Administrator, Civil Service (Medical): No    Lack of Transportation (Non-Medical): No  Physical Activity: Inactive (04/22/2024)   Exercise Vital Sign    Days of Exercise per Week: 0 days    Minutes of Exercise per Session: 0 min  Stress: Stress Concern Present (04/22/2024)   [employer] of Occupational Health - Occupational Stress Questionnaire    Feeling of Stress : To some extent  Social Connections: Moderately Integrated (03/03/2024)   Social Connection and Isolation Panel    Frequency  of Communication with Friends and Family: More than three times a week    Frequency of Social Gatherings with Friends and Family: More than three times a week    Attends Religious Services: Never    Database administrator or Organizations: Yes    Attends Engineer, structural: More than 4 times per year    Marital Status: Married  Catering manager Violence: Not At Risk (06/11/2024)   Humiliation, Afraid, Rape, and Kick questionnaire    Fear of Current or Ex-Partner: No    Emotionally Abused: No    Physically Abused: No    Sexually Abused: No    FAMILY HISTORY: Family History  Problem Relation Age of Onset   Breast cancer Mother 33   Heart disease Father 55       sudden onset due to CAD   Hypertension Sister    Dementia Sister    Diabetes Sister    Cancer - Colon Sister 60   Heart disease Brother    Hypertension Brother    Parkinson's disease Brother    GER disease Daughter    Hypertension Daughter    Heart attack Neg Hx    Stroke Neg Hx    Esophageal cancer Neg Hx    Rectal cancer Neg Hx    Stomach cancer Neg Hx     ALLERGIES:  is allergic to hydrocodone -acetaminophen , percocet [oxycodone -acetaminophen ], and sulfa antibiotics.  MEDICATIONS:  Current Outpatient Medications  Medication Sig Dispense Refill   albuterol  (VENTOLIN  HFA) 108 (90 Base) MCG/ACT inhaler Inhale 2 puffs into the lungs as needed for wheezing.     Alum & Mag Hydroxide-Simeth (MAALOX REGULAR STRENGTH PO) Take by mouth daily as needed. Patient takes 1 capful daily of the powder formulation.     aspirin  EC 81 MG tablet Take 1 tablet (81 mg total) by mouth daily.     atorvastatin  (LIPITOR ) 80 MG tablet Take 1 tablet (80 mg total) by mouth daily. 90 tablet 0  BLINK TEARS 0.25 % SOLN Place 1 drop into both eyes daily as needed (dry/irritated eyes.).     calcium -vitamin D  (OSCAL WITH D) 500-200 MG-UNIT tablet Take 1 tablet by mouth 2 (two) times daily.     chlorthalidone  (HYGROTON ) 25 MG tablet TAKE  1 TABLET BY MOUTH EVERY DAY 90 tablet 3   CINNAMON PO Take 1,000 mg by mouth in the morning and at bedtime.     empagliflozin  (JARDIANCE ) 10 MG TABS tablet Take 1 tablet (10 mg total) by mouth daily before breakfast. 30 tablet 11   ferrous sulfate 325 (65 FE) MG tablet Take 325 mg by mouth daily.     gabapentin  (NEURONTIN ) 300 MG capsule TAKE ONE CAPSULE IN THE MORNING, ONE CAPSULE IN THE EVENING AND 3 CAPSULES AT BEDTIME. 150 capsule 0   Lancet Device MISC Use to check blood sugar twice daily. 1 each 0   Lancets (FREESTYLE) lancets Use to check blood sugar twice daily 100 each 11   losartan  (COZAAR ) 50 MG tablet Take 1 tablet (50 mg total) by mouth daily. 30 tablet 3   metFORMIN  (GLUCOPHAGE -XR) 500 MG 24 hr tablet TAKE 2 TABLETS BY MOUTH TWICE A DAY 360 tablet 3   methylcellulose (CITRUCEL) oral powder Use as directed daily     metoprolol  succinate (TOPROL -XL) 100 MG 24 hr tablet TAKE 1 TABLET BY MOUTH EVERY DAY WITH OR IMMEDIATELY FOLLOWING A MEAL 90 tablet 1   mirabegron  ER (MYRBETRIQ ) 50 MG TB24 tablet Take 1 tablet (50 mg total) by mouth daily. 30 tablet 11   Multiple Vitamins-Minerals (PRESERVISION AREDS PO) Take 1 tablet by mouth 2 (two) times daily. Reported on 12/23/2015     ONETOUCH ULTRA test strip USE TO CHECK BLOOD SUGAR TWICE DAILY 150 strip 3   Probiotic Product (ALIGN PO) Take by mouth at bedtime.     QUEtiapine  (SEROQUEL ) 25 MG tablet TAKE 1 TABLET BY MOUTH EVERYDAY AT BEDTIME 90 tablet 1   rOPINIRole  (REQUIP ) 1 MG tablet 1/2 TABLET DURING THE DAY, 2 TABLETS AT BEDTIME 270 tablet 3   venlafaxine  XR (EFFEXOR -XR) 75 MG 24 hr capsule TAKE 1 CAPSULE BY MOUTH DAILY WITH BREAKFAST. 90 capsule 1   vitamin B-12 (CYANOCOBALAMIN ) 1000 MCG tablet Take 1,000 mcg by mouth daily.     nitroGLYCERIN  (NITROSTAT ) 0.4 MG SL tablet PLACE 1 TABLET (0.4 MG TOTAL) UNDER THE TONGUE EVERY 5 (FIVE) MINUTES AS NEEDED FOR CHEST PAIN (Patient not taking: Reported on 06/11/2024) 25 tablet 3   No current  facility-administered medications for this visit.    Review of Systems  Constitutional:  Negative for appetite change, chills, fatigue and fever.  HENT:   Negative for hearing loss and voice change.   Eyes:  Negative for eye problems.  Respiratory:  Negative for chest tightness and cough.   Cardiovascular:  Negative for chest pain.  Gastrointestinal:  Negative for abdominal distention, abdominal pain and blood in stool.  Endocrine: Negative for hot flashes.  Genitourinary:  Negative for difficulty urinating and frequency.   Musculoskeletal:  Negative for arthralgias.  Skin:  Negative for itching and rash.  Neurological:  Negative for extremity weakness.       Restless leg syndrome  Hematological:  Negative for adenopathy.  Psychiatric/Behavioral:  Negative for confusion.    PHYSICAL EXAMINATION:  Vitals:   06/11/24 1133  BP: (!) 149/65  Pulse: (!) 59  Resp: 18  Temp: 97.6 F (36.4 C)   Filed Weights   06/11/24 1133  Weight: 129 lb  6.4 oz (58.7 kg)    Physical Exam  LABORATORY DATA:  I have reviewed the data as listed    Latest Ref Rng & Units 06/11/2024   11:58 AM 01/18/2024   12:34 PM 01/15/2024   12:31 PM  CBC  WBC 4.0 - 10.5 K/uL 8.0  10.7  8.4   Hemoglobin 12.0 - 15.0 g/dL 86.6  85.2  87.0   Hematocrit 36.0 - 46.0 % 41.4  44.1  39.9   Platelets 150 - 400 K/uL 275  417.0  375       Latest Ref Rng & Units 03/13/2024    9:56 AM 02/19/2024    8:41 AM 01/18/2024   12:34 PM  CMP  Glucose 70 - 99 mg/dL 841  858  775   BUN 6 - 23 mg/dL 33  30  22   Creatinine 0.40 - 1.20 mg/dL 8.96  9.17  9.11   Sodium 135 - 145 mEq/L 139  142  140   Potassium 3.5 - 5.1 mEq/L 4.1  4.0  4.2   Chloride 96 - 112 mEq/L 100  100  98   CO2 19 - 32 mEq/L 31  32  31   Calcium  8.4 - 10.5 mg/dL 9.8  9.2  9.8   Total Protein 6.0 - 8.3 g/dL  6.0  6.5   Total Bilirubin 0.2 - 1.2 mg/dL  0.3  0.6   Alkaline Phos 39 - 117 U/L  91  79   AST 0 - 37 U/L  24  26   ALT 0 - 35 U/L  41  39        RADIOGRAPHIC STUDIES: I have personally reviewed the radiological images as listed and agreed with the findings in the report. No results found.

## 2024-06-12 DIAGNOSIS — M48062 Spinal stenosis, lumbar region with neurogenic claudication: Secondary | ICD-10-CM | POA: Diagnosis not present

## 2024-06-12 DIAGNOSIS — M47816 Spondylosis without myelopathy or radiculopathy, lumbar region: Secondary | ICD-10-CM | POA: Diagnosis not present

## 2024-06-13 ENCOUNTER — Ambulatory Visit: Admitting: Family Medicine

## 2024-06-13 ENCOUNTER — Encounter: Payer: Self-pay | Admitting: Family Medicine

## 2024-06-13 VITALS — BP 120/70 | HR 70 | Temp 97.7°F | Ht 61.5 in | Wt 127.5 lb

## 2024-06-13 DIAGNOSIS — G4700 Insomnia, unspecified: Secondary | ICD-10-CM | POA: Diagnosis not present

## 2024-06-13 DIAGNOSIS — Z7984 Long term (current) use of oral hypoglycemic drugs: Secondary | ICD-10-CM | POA: Diagnosis not present

## 2024-06-13 DIAGNOSIS — E1129 Type 2 diabetes mellitus with other diabetic kidney complication: Secondary | ICD-10-CM | POA: Diagnosis not present

## 2024-06-13 DIAGNOSIS — R809 Proteinuria, unspecified: Secondary | ICD-10-CM | POA: Diagnosis not present

## 2024-06-13 DIAGNOSIS — E1159 Type 2 diabetes mellitus with other circulatory complications: Secondary | ICD-10-CM | POA: Diagnosis not present

## 2024-06-13 DIAGNOSIS — I152 Hypertension secondary to endocrine disorders: Secondary | ICD-10-CM

## 2024-06-13 LAB — POCT GLYCOSYLATED HEMOGLOBIN (HGB A1C): Hemoglobin A1C: 7.9 % — AB (ref 4.0–5.6)

## 2024-06-13 NOTE — Assessment & Plan Note (Addendum)
 Chronic, improved control with additional of losartan  in office today but per patient she has had some recent elevations.   losartan  50 mg p.o. daily... If she continues to notice blood pressure greater than 140/90 at home she will contact me for possible increase in losartan . Chlorthalidone  25 mg daily Metoprolol  XL 100 mg daily

## 2024-06-13 NOTE — Assessment & Plan Note (Signed)
 Chronic, worsened She states that she has had more difficulty sleeping since she moves her Jardiance  to bedtime.  During the day Jardiance  made her feel woozy per her report.  Of note these are not listed as side effects on Jardiance 's profile.  She does have significant restless legs that sounds like it is keeping her up at night.  Her hematologist is planning iron infusions as they feel iron deficiency could be a primary cause. Also in discussion she is no longer taking the Seroquel  that she was previously for sleep.  I doubt Jardiance  is the cause of her sleep disruption and restless leg but we will hold off on increase today and reconsider in 68-month if feeling better and A1c not less than 7.

## 2024-06-13 NOTE — Assessment & Plan Note (Addendum)
 Chronic, improving but inadequate control  Working low carb diet.Significant room for improvement. Given possib sleep/restless leg SE  will hold off on increase today. Metformin  1000 mg p.o. twice daily Jardiance  10 mg daily. if not improving in 3 months Increase to 25 mg daily  given inadequate control of A1c.  03/2024 GFR 50

## 2024-06-13 NOTE — Assessment & Plan Note (Signed)
 On ARB and SGLT2i

## 2024-06-13 NOTE — Progress Notes (Signed)
 25 mg daily   Patient ID: [name], female    DOB: [birth year], 83 y.o.   MRN: [id number]  This visit was conducted in person.  BP 120/70   Pulse 70   Temp 97.7 F (36.5 C) (Temporal)   Ht 5' 1.5 (1.562 m)   Wt 127 lb 8 oz (57.8 kg)   SpO2 95%   BMI 23.70 kg/m    CC:  Chief Complaint  Patient presents with   Diabetes    Subjective:   HPI: Morgan [name] is a 83 y.o. female presenting on 06/13/2024 for Diabetes Followed by Dr. Maree for recurrent syncope  Reviewed OV note from 11/2022 MRI findings were stable. MRA head and neck negative. EEG did not show any epileptiform discharges.   Cardiac workup, including echocardiogram and Holter monitor, has been completed.   Followed by Dr. [facility].  2025 OV note reviewed.  Paroxsysmal Afib, hx of CAD   Referred to PT at [facility]  for gait instability.  Flowsheet Row Office Visit from 06/13/2024 in [facility] [place]  PHQ-2 Total Score 0   Hypertension:    At goal chlorthalidone , metoprolol ,  losartan  50 mg daily BP Readings from Last 3 Encounters:  06/13/24 120/70  06/11/24 (!) 149/65  05/19/24 (!) 145/63  Using medication without problems or lightheadedness:  none Chest pain with exertion: none Edema: none Short of breath:  occ Average home BPs: Fluctuating at different doctors visits Other issues:  Diabetes:     metformin   1000 mg BID, off victoza  ( caused GI issues)  On jardiance ..10 mg for kidney protection and DM control. Lab Results  Component Value Date   HGBA1C 7.9 (A) 06/13/2024  Using medications without difficulties: she associated jardiance  with trouble sleeping at night, woozy if taking during the day. legs restless keeping her up at night..starting iron infusions. Hypoglycemic episodes: none Hyperglycemic episodes: none Feet problems: none Blood Sugars averaging: FBS 180 eye exam within last year:  occ. Due for foot exam  Microalbumin positive.. NOW on ACE/ARB   and SGLT2i   Wt Readings from Last 3 Encounters:  06/13/24 127 lb 8 oz (57.8 kg)  06/11/24 129 lb 6.4 oz (58.7 kg)  05/19/24 127 lb (57.6 kg)    Urge incontinence:   Myrbetriq  works better than gemtessa... still an issue.. planning an implant per urology.   Relevant past medical, surgical, family and social history reviewed and updated as indicated. Interim medical history since our last visit reviewed. Allergies and medications reviewed and updated. Outpatient Medications Prior to Visit  Medication Sig Dispense Refill   albuterol  (VENTOLIN  HFA) 108 (90 Base) MCG/ACT inhaler Inhale 2 puffs into the lungs as needed for wheezing.     Alum & Mag Hydroxide-Simeth (MAALOX REGULAR STRENGTH PO) Take by mouth daily as needed. Patient takes 1 capful daily of the powder formulation.     aspirin  EC 81 MG tablet Take 1 tablet (81 mg total) by mouth daily.     atorvastatin  (LIPITOR ) 80 MG tablet Take 1 tablet (80 mg total) by mouth daily. 90 tablet 0   BLINK TEARS 0.25 % SOLN Place 1 drop into both eyes daily as needed (dry/irritated eyes.).     calcium -vitamin D  (OSCAL WITH D) 500-200 MG-UNIT tablet Take 1 tablet by mouth 2 (two) times daily.     chlorthalidone  (HYGROTON ) 25 MG tablet TAKE 1 TABLET BY MOUTH EVERY DAY 90 tablet 3   CINNAMON PO Take 1,000 mg by mouth in  the morning and at bedtime.     empagliflozin  (JARDIANCE ) 10 MG TABS tablet Take 1 tablet (10 mg total) by mouth daily before breakfast. 30 tablet 11   ferrous sulfate 325 (65 FE) MG tablet Take 325 mg by mouth daily.     gabapentin  (NEURONTIN ) 300 MG capsule TAKE ONE CAPSULE IN THE MORNING, ONE CAPSULE IN THE EVENING AND 3 CAPSULES AT BEDTIME. 150 capsule 0   Lancet Device MISC Use to check blood sugar twice daily. 1 each 0   Lancets (FREESTYLE) lancets Use to check blood sugar twice daily 100 each 11   losartan  (COZAAR ) 50 MG tablet Take 1 tablet (50 mg total) by mouth daily. 30 tablet 3   metFORMIN  (GLUCOPHAGE -XR) 500 MG 24 hr  tablet TAKE 2 TABLETS BY MOUTH TWICE A DAY 360 tablet 3   methylcellulose (CITRUCEL) oral powder Use as directed daily     metoprolol  succinate (TOPROL -XL) 100 MG 24 hr tablet TAKE 1 TABLET BY MOUTH EVERY DAY WITH OR IMMEDIATELY FOLLOWING A MEAL 90 tablet 1   mirabegron  ER (MYRBETRIQ ) 50 MG TB24 tablet Take 1 tablet (50 mg total) by mouth daily. 30 tablet 11   Multiple Vitamins-Minerals (PRESERVISION AREDS PO) Take 1 tablet by mouth 2 (two) times daily. Reported on 12/23/2015     nitroGLYCERIN  (NITROSTAT ) 0.4 MG SL tablet PLACE 1 TABLET (0.4 MG TOTAL) UNDER THE TONGUE EVERY 5 (FIVE) MINUTES AS NEEDED FOR CHEST PAIN 25 tablet 3   ONETOUCH ULTRA test strip USE TO CHECK BLOOD SUGAR TWICE DAILY 150 strip 3   Probiotic Product (ALIGN PO) Take by mouth at bedtime.     QUEtiapine  (SEROQUEL ) 25 MG tablet TAKE 1 TABLET BY MOUTH EVERYDAY AT BEDTIME 90 tablet 1   rOPINIRole  (REQUIP ) 1 MG tablet 1/2 TABLET DURING THE DAY, 2 TABLETS AT BEDTIME 270 tablet 3   venlafaxine  XR (EFFEXOR -XR) 75 MG 24 hr capsule TAKE 1 CAPSULE BY MOUTH DAILY WITH BREAKFAST. 90 capsule 1   vitamin B-12 (CYANOCOBALAMIN ) 1000 MCG tablet Take 1,000 mcg by mouth daily.     No facility-administered medications prior to visit.     Per HPI unless specifically indicated in ROS section below Review of Systems  Constitutional:  Negative for fatigue and fever.  HENT:  Negative for congestion.   Eyes:  Negative for pain.  Respiratory:  Negative for cough and shortness of breath.   Cardiovascular:  Negative for chest pain, palpitations and leg swelling.  Gastrointestinal:  Negative for abdominal pain.  Genitourinary:  Negative for dysuria and vaginal bleeding.  Musculoskeletal:  Negative for back pain.  Neurological:  Negative for syncope, light-headedness and headaches.  Psychiatric/Behavioral:  Positive for agitation and sleep disturbance. Negative for confusion, decreased concentration, dysphoric mood and suicidal ideas. The patient is  nervous/anxious and is hyperactive.    Objective:  BP 120/70   Pulse 70   Temp 97.7 F (36.5 C) (Temporal)   Ht 5' 1.5 (1.562 m)   Wt 127 lb 8 oz (57.8 kg)   SpO2 95%   BMI 23.70 kg/m   Wt Readings from Last 3 Encounters:  06/13/24 127 lb 8 oz (57.8 kg)  06/11/24 129 lb 6.4 oz (58.7 kg)  05/19/24 127 lb (57.6 kg)      Physical Exam Vitals and nursing note reviewed.  Constitutional:      General: She is not in acute distress.    Appearance: Normal appearance. She is well-developed. She is not ill-appearing or toxic-appearing.  HENT:  Head: Normocephalic.     Right Ear: Hearing, tympanic membrane, ear canal and external ear normal.     Left Ear: Hearing, tympanic membrane, ear canal and external ear normal.     Nose: Nose normal.  Eyes:     General: Lids are normal. Lids are everted, no foreign bodies appreciated.     Conjunctiva/sclera: Conjunctivae normal.     Pupils: Pupils are equal, round, and reactive to light.  Neck:     Thyroid : No thyroid  mass or thyromegaly.     Vascular: No carotid bruit.     Trachea: Trachea normal.  Cardiovascular:     Rate and Rhythm: Normal rate and regular rhythm.     Heart sounds: Normal heart sounds, S1 normal and S2 normal. No murmur heard.    No gallop.  Pulmonary:     Effort: Pulmonary effort is normal. No respiratory distress.     Breath sounds: Normal breath sounds. No wheezing, rhonchi or rales.  Abdominal:     General: Bowel sounds are normal. There is no distension or abdominal bruit.     Palpations: Abdomen is soft. There is no fluid wave or mass.     Tenderness: There is no abdominal tenderness. There is no guarding or rebound.     Hernia: No hernia is present.  Musculoskeletal:     Cervical back: Normal range of motion and neck supple.  Lymphadenopathy:     Cervical: No cervical adenopathy.  Skin:    General: Skin is warm and dry.     Findings: No rash.  Neurological:     Mental Status: She is alert.     Cranial  Nerves: No cranial nerve deficit.     Sensory: No sensory deficit.  Psychiatric:        Mood and Affect: Mood is not anxious or depressed.        Speech: Speech normal.        Behavior: Behavior normal. Behavior is cooperative.        Judgment: Judgment normal.         Results for orders placed or performed in visit on 06/13/24  POCT glycosylated hemoglobin (Hb A1C)   Collection Time: 06/13/24  9:18 AM  Result Value Ref Range   Hemoglobin A1C 7.9 (A) 4.0 - 5.6 %   HbA1c POC (<> result, manual entry)     HbA1c, POC (prediabetic range)     HbA1c, POC (controlled diabetic range)     *Note: Due to a large number of results and/or encounters for the requested time period, some results have not been displayed. A complete set of results can be found in Results Review.    This visit occurred during the SARS-CoV-2 public health emergency.  Safety protocols were in place, including screening questions prior to the visit, additional usage of staff PPE, and extensive cleaning of exam room while observing appropriate contact time as indicated for disinfecting solutions.   COVID 19 screen:  No recent travel or known exposure to COVID19 The patient denies respiratory symptoms of COVID 19 at this time. The importance of social distancing was discussed today.   Assessment and Plan   Problem List Items Addressed This Visit     Hypertension associated with diabetes (HCC) (Chronic)   Chronic, improved control with additional of losartan  in office today but per patient she has had some recent elevations.   losartan  50 mg p.o. daily... If she continues to notice blood pressure greater than 140/90 at home she  will contact me for possible increase in losartan . Chlorthalidone  25 mg daily Metoprolol  XL 100 mg daily      Insomnia   Chronic, worsened She states that she has had more difficulty sleeping since she moves her Jardiance  to bedtime.  During the day Jardiance  made her feel woozy per her  report.  Of note these are not listed as side effects on Jardiance 's profile.  She does have significant restless legs that sounds like it is keeping her up at night.  Her hematologist is planning iron infusions as they feel iron deficiency could be a primary cause. Also in discussion she is no longer taking the Seroquel  that she was previously for sleep.  I doubt Jardiance  is the cause of her sleep disruption and restless leg but we will hold off on increase today and reconsider in 85-month if feeling better and A1c not less than 7.      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 Given possib sleep/restless leg SE  will hold off on increase today. Metformin  1000 mg p.o. twice daily Jardiance  10 mg daily. if not improving in 3 months Increase to 25 mg daily  given inadequate control of A1c.  03/2024 GFR 50      Relevant Orders   POCT glycosylated hemoglobin (Hb A1C) (Completed)       Greig Ring, MD

## 2024-06-17 ENCOUNTER — Ambulatory Visit: Admitting: Obstetrics

## 2024-06-19 DIAGNOSIS — M47816 Spondylosis without myelopathy or radiculopathy, lumbar region: Secondary | ICD-10-CM | POA: Diagnosis not present

## 2024-06-19 DIAGNOSIS — M48062 Spinal stenosis, lumbar region with neurogenic claudication: Secondary | ICD-10-CM | POA: Diagnosis not present

## 2024-06-20 ENCOUNTER — Inpatient Hospital Stay: Attending: Oncology

## 2024-06-20 VITALS — BP 134/58 | HR 59 | Temp 96.8°F | Resp 16

## 2024-06-20 DIAGNOSIS — E611 Iron deficiency: Secondary | ICD-10-CM | POA: Diagnosis not present

## 2024-06-20 DIAGNOSIS — G2581 Restless legs syndrome: Secondary | ICD-10-CM | POA: Diagnosis not present

## 2024-06-20 MED ORDER — IRON SUCROSE 20 MG/ML IV SOLN
200.0000 mg | Freq: Once | INTRAVENOUS | Status: AC
  Administered 2024-06-20: 200 mg via INTRAVENOUS
  Filled 2024-06-20: qty 10

## 2024-06-20 NOTE — Patient Instructions (Signed)

## 2024-06-22 ENCOUNTER — Encounter: Payer: Self-pay | Admitting: Intensive Care

## 2024-06-22 ENCOUNTER — Emergency Department
Admission: EM | Admit: 2024-06-22 | Discharge: 2024-06-22 | Disposition: A | Discharge status: Home / Self Care | Attending: Emergency Medicine | Admitting: Emergency Medicine

## 2024-06-22 ENCOUNTER — Other Ambulatory Visit: Payer: Self-pay

## 2024-06-22 DIAGNOSIS — S80861A Insect bite (nonvenomous), right lower leg, initial encounter: Secondary | ICD-10-CM | POA: Diagnosis not present

## 2024-06-22 DIAGNOSIS — S80862A Insect bite (nonvenomous), left lower leg, initial encounter: Secondary | ICD-10-CM | POA: Insufficient documentation

## 2024-06-22 DIAGNOSIS — Z7984 Long term (current) use of oral hypoglycemic drugs: Secondary | ICD-10-CM | POA: Diagnosis not present

## 2024-06-22 DIAGNOSIS — E119 Type 2 diabetes mellitus without complications: Secondary | ICD-10-CM | POA: Insufficient documentation

## 2024-06-22 DIAGNOSIS — I2510 Atherosclerotic heart disease of native coronary artery without angina pectoris: Secondary | ICD-10-CM | POA: Insufficient documentation

## 2024-06-22 DIAGNOSIS — Z7982 Long term (current) use of aspirin: Secondary | ICD-10-CM | POA: Insufficient documentation

## 2024-06-22 DIAGNOSIS — L089 Local infection of the skin and subcutaneous tissue, unspecified: Secondary | ICD-10-CM | POA: Insufficient documentation

## 2024-06-22 DIAGNOSIS — I10 Essential (primary) hypertension: Secondary | ICD-10-CM | POA: Diagnosis not present

## 2024-06-22 DIAGNOSIS — W57XXXA Bitten or stung by nonvenomous insect and other nonvenomous arthropods, initial encounter: Secondary | ICD-10-CM | POA: Insufficient documentation

## 2024-06-22 DIAGNOSIS — Z79899 Other long term (current) drug therapy: Secondary | ICD-10-CM | POA: Diagnosis not present

## 2024-06-22 HISTORY — DX: Heart failure, unspecified: I50.9

## 2024-06-22 MED ORDER — DEXAMETHASONE SODIUM PHOSPHATE 10 MG/ML IJ SOLN
5.0000 mg | Freq: Once | INTRAMUSCULAR | Status: AC
  Administered 2024-06-22: 5 mg via INTRAMUSCULAR
  Filled 2024-06-22: qty 1

## 2024-06-22 MED ORDER — PREDNISONE 20 MG PO TABS: 20.0000 mg | ORAL_TABLET | Freq: Every day | ORAL | 0 refills | Status: AC

## 2024-06-22 NOTE — Discharge Instructions (Signed)
 Please continue with topical hydrocortisone  cream.  Keep lower extremities elevated.  You may apply ice 20 minutes every hour to the areas that are causing the most pain, swelling and itching.  Tomorrow start prednisone  daily for up to 4 days if needed.

## 2024-06-22 NOTE — ED Provider Notes (Signed)
 [place] EMERGENCY DEPARTMENT AT [facility] Provider Note   CSN: [id number] Arrival date & time: 06/22/24  9198     Patient presents with: Insect Bite   Morgan [name] is a 83 y.o. female with history of diabetes, coronary artery disease, hypertension, GERD and fibromyalgia presents with bilateral lower extremity pain from insect bite.  She was bit last night by multiple fire ants and has small whelps on her lower extremities feet and ankles.  No chest pain shortness of breath.  No difficulty swallowing or breathing.  She has not take any medications orally but has applied some topical hydrocortisone  with little relief      Prior to Admission medications   Medication Sig Start Date End Date Taking? Authorizing Provider  predniSONE  (DELTASONE ) 20 MG tablet Take 1 tablet (20 mg total) by mouth daily with breakfast for 4 days. 06/22/24 06/26/24 Yes Charlene Debby BROCKS, PA-C  albuterol  (VENTOLIN  HFA) 108 [phone] Base) MCG/ACT inhaler Inhale 2 puffs into the lungs as needed for wheezing. 01/11/24 01/10/25  [provider]  Alum & Mag Hydroxide-Simeth (MAALOX REGULAR STRENGTH PO) Take by mouth daily as needed. Patient takes 1 capful daily of the powder formulation.    [provider]  aspirin  EC 81 MG tablet Take 1 tablet (81 mg total) by mouth daily. 10/01/18   Jeffrie Oneil BROCKS, MD  atorvastatin  (LIPITOR ) 80 MG tablet Take 1 tablet (80 mg total) by mouth daily. 04/22/24   Jeffrie Oneil BROCKS, MD  BLINK TEARS 0.25 % SOLN Place 1 drop into both eyes daily as needed (dry/irritated eyes.).    [provider]  calcium -vitamin D  (OSCAL WITH D) 500-200 MG-UNIT tablet Take 1 tablet by mouth 2 (two) times daily.    [provider]  chlorthalidone  (HYGROTON ) 25 MG tablet TAKE 1 TABLET BY MOUTH EVERY DAY 03/14/24   Bedsole, Amy E, MD  CINNAMON PO Take 1,000 mg by mouth in the morning and at bedtime.    [provider]  empagliflozin  (JARDIANCE ) 10 MG TABS tablet Take 1  tablet (10 mg total) by mouth daily before breakfast. 02/28/24   Bedsole, Amy E, MD  ferrous sulfate 325 (65 FE) MG tablet Take 325 mg by mouth daily.    [provider]  gabapentin  (NEURONTIN ) 300 MG capsule TAKE ONE CAPSULE IN THE MORNING, ONE CAPSULE IN THE EVENING AND 3 CAPSULES AT BEDTIME. 05/27/24   Bedsole, Amy E, MD  Lancet Device MISC Use to check blood sugar twice daily. 10/16/18   Bedsole, Amy E, MD  Lancets (FREESTYLE) lancets Use to check blood sugar twice daily 10/16/18   Bedsole, Amy E, MD  losartan  (COZAAR ) 50 MG tablet Take 1 tablet (50 mg total) by mouth daily. 02/28/24   Bedsole, Amy E, MD  metFORMIN  (GLUCOPHAGE -XR) 500 MG 24 hr tablet TAKE 2 TABLETS BY MOUTH TWICE A DAY 03/14/24   Bedsole, Amy E, MD  methylcellulose (CITRUCEL) oral powder Use as directed daily 03/31/21   Armbruster, Elspeth SQUIBB, MD  metoprolol  succinate (TOPROL -XL) 100 MG 24 hr tablet TAKE 1 TABLET BY MOUTH EVERY DAY WITH OR IMMEDIATELY FOLLOWING A MEAL 04/15/24   Bedsole, Amy E, MD  mirabegron  ER (MYRBETRIQ ) 50 MG TB24 tablet Take 1 tablet (50 mg total) by mouth daily. 02/28/24   Bedsole, Amy E, MD  Multiple Vitamins-Minerals (PRESERVISION AREDS PO) Take 1 tablet by mouth 2 (two) times daily. Reported on 12/23/2015    [provider]  nitroGLYCERIN  (NITROSTAT ) 0.4 MG SL tablet PLACE 1  TABLET (0.4 MG TOTAL) UNDER THE TONGUE EVERY 5 (FIVE) MINUTES AS NEEDED FOR CHEST PAIN 09/05/22   Lucien Orren SAILOR, PA-C  ONETOUCH ULTRA test strip USE TO CHECK BLOOD SUGAR TWICE DAILY 08/13/19   Bedsole, Amy E, MD  Probiotic Product (ALIGN PO) Take by mouth at bedtime.    [provider]  QUEtiapine  (SEROQUEL ) 25 MG tablet TAKE 1 TABLET BY MOUTH EVERYDAY AT BEDTIME 12/06/22   Bedsole, Amy E, MD  rOPINIRole  (REQUIP ) 1 MG tablet 1/2 TABLET DURING THE DAY, 2 TABLETS AT BEDTIME 08/23/23   Bedsole, Amy E, MD  venlafaxine  XR (EFFEXOR -XR) 75 MG 24 hr capsule TAKE 1 CAPSULE BY MOUTH DAILY WITH BREAKFAST. 05/26/24   Bedsole, Amy E, MD   vitamin B-12 (CYANOCOBALAMIN ) 1000 MCG tablet Take 1,000 mcg by mouth daily.    [provider]    Allergies: Hydrocodone -acetaminophen , Percocet [oxycodone -acetaminophen ], and Sulfa antibiotics    Review of Systems  Updated Vital Signs BP (!) 162/78 (BP Location: Left Arm)   Pulse 65   Temp (!) 97.5 F (36.4 C) (Oral)   Resp 15   Ht 5' 3 (1.6 m)   Wt 56.7 kg   SpO2 98%   BMI 22.14 kg/m   Physical Exam Constitutional:      Appearance: She is well-developed.  HENT:     Head: Normocephalic and atraumatic.  Eyes:     Conjunctiva/sclera: Conjunctivae normal.  Cardiovascular:     Rate and Rhythm: Normal rate.  Pulmonary:     Effort: Pulmonary effort is normal. No respiratory distress.  Musculoskeletal:        General: Normal range of motion.     Cervical back: Normal range of motion.  Skin:    General: Skin is warm.     Findings: No rash.     Comments: Small localized 1 cm areas of raised red bumps along the lower legs, several on the right tibia foot and ankle.  At least 6 are noted on the left lower extremity along the tibia down around the foot and ankle.  Most severe area of localized swelling is along the left posterior lateral ankle, no more than 3 to 4 cm in diameter of mild erythema.  There is no signs of any abscess formation.  She has good ankle range of motion bilaterally with no pain, no signs of joint effusions.  She is neurovascular intact.  Negative Homans' sign bilaterally.  Compartments are soft.  Neurological:     General: No focal deficit present.     Mental Status: She is alert and oriented to person, place, and time.  Psychiatric:        Behavior: Behavior normal.        Thought Content: Thought content normal.     (all labs ordered are listed, but only abnormal results are displayed) Labs Reviewed - No data to display  EKG: None  Radiology: No results found.   Procedures   Medications Ordered in the ED  dexamethasone  (DECADRON )  injection 5 mg (has no administration in time range)                                    Medical Decision Making Risk Prescription drug management.   83 year old female with multiple fire ant bites to the lower extremities.  Bites occurred last night but having pain swelling and itching.  No signs of systemic reaction, vital signs stable afebrile  with normal breath sounds and no facial swelling or difficulty swallowing.  She will continue with topical hydrocortisone  cream.  She is given a 5 mg dexamethasone  injection IM.  She is given prescription for prednisone  20 mg daily to start tomorrow if needed.  She is given strict return precautions understands signs symptoms return to the ER for. Final diagnoses:  Insect bite of left lower leg, initial encounter  Infected insect bite of lower leg, right, initial encounter    ED Discharge Orders          Ordered    predniSONE  (DELTASONE ) 20 MG tablet  Daily with breakfast        06/22/24 0857               Charlene Debby BROCKS, PA-C 06/22/24 0901    Dorothyann Drivers, MD 06/22/24 1506

## 2024-06-22 NOTE — ED Triage Notes (Signed)
 Patient reports getting bit by red ants yesterday. C/o itching and numbness to bilateral feet/legs

## 2024-06-22 NOTE — ED Notes (Signed)
 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 Sensation feels the same on all extremities, face symmetrical. Pt denies any CP/SOB. Pt stating this is the second time being attacked by ants, happened last year as well.

## 2024-06-24 DIAGNOSIS — M47816 Spondylosis without myelopathy or radiculopathy, lumbar region: Secondary | ICD-10-CM | POA: Diagnosis not present

## 2024-06-24 DIAGNOSIS — M48062 Spinal stenosis, lumbar region with neurogenic claudication: Secondary | ICD-10-CM | POA: Diagnosis not present

## 2024-06-26 ENCOUNTER — Ambulatory Visit: Admitting: Obstetrics and Gynecology

## 2024-06-27 ENCOUNTER — Inpatient Hospital Stay

## 2024-06-27 VITALS — BP 146/64 | HR 64 | Temp 97.9°F | Resp 16

## 2024-06-27 DIAGNOSIS — G2581 Restless legs syndrome: Secondary | ICD-10-CM | POA: Diagnosis not present

## 2024-06-27 DIAGNOSIS — E611 Iron deficiency: Secondary | ICD-10-CM | POA: Diagnosis not present

## 2024-06-27 MED ORDER — IRON SUCROSE 20 MG/ML IV SOLN
200.0000 mg | Freq: Once | INTRAVENOUS | Status: AC
  Administered 2024-06-27: 200 mg via INTRAVENOUS
  Filled 2024-06-27: qty 10

## 2024-07-01 ENCOUNTER — Other Ambulatory Visit: Payer: Self-pay | Admitting: Licensed Clinical Social Worker

## 2024-07-01 DIAGNOSIS — M48062 Spinal stenosis, lumbar region with neurogenic claudication: Secondary | ICD-10-CM | POA: Diagnosis not present

## 2024-07-01 DIAGNOSIS — M47816 Spondylosis without myelopathy or radiculopathy, lumbar region: Secondary | ICD-10-CM | POA: Diagnosis not present

## 2024-07-03 NOTE — Progress Notes (Unsigned)
 Office Visit    Patient Name: Morgan [name] Date of Encounter: 07/04/2024  PCP:  Avelina Greig BRAVO, MD   [facility]  Cardiologist:  Oneil Parchment, MD  Advanced Practice Provider:  No care team member to display Electrophysiologist:  None   HPI    Morgan [name] is a 83 y.o. female with a past medical history significant for CAD, hx of stroke, OSA on CPAP, hypertension, and atrial fibrillation presents today for follow-up visit and hospital follow-up.  Patient has a history of coronary bypass grafting in 2006 and diagonal stent after CABG.  Cerebellar stroke in 2015.  Angioedema right posterior neck melanoma discovered by Dr. Shlomo.  Nuclear stress test 2017 was reassuring.  Had a fundoplication for her esophagus in March 2021.  Prior shortness of breath attributed to deconditioning.  Echo was reassuring.  Reported shortness of breath with incline and fatigue.  She had some poor balance and broke both of her arms due to a fall.  Last seen 07/2021 and was doing well at that time.  She had been getting over a COVID infection.  She did not take enough medications for her trip to [place]  and was off medicines for 4 days.  By the end of her trip she was experiencing hallucinations, racing heart and word slurring.  On her way back home she called her daughter and she scheduled an appointment and was told to half her clonazepam  and an MRI was ordered.  She was feeling better by the time she got to her appointment with Dr. Parchment.  Eventually Dr. Avelina discontinued her clonazepam  completely.  She was also having pounding palpitations and was currently taking metoprolol  succinate 100 mg daily.  I saw her October 2023, she tells me she had a bad fall around October 15 and the following week she had to go back to the ER for chest pain.  She had a full cardiac work-up including lab work and EKG and everything was negative.  Thought to be anxiety.  During her fall she broke her  cheekbone and nose.  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  She took her sleeping pill before bed as well as a tramadol  for wrist pain and collapsed.  She woke up in a pool of blood.  She could not move and her blood pressure was very low.  They did a CT scan of the head as well as an MRI.  She ended up with 7 stitches in her upper lip.  She did not have a brain bleed but has had 1 in the past.  She tells me that any chest pain that she has had she thinks is more related to indigestion.  She says it is a burning in the center of her chest usually after food.  She used to take something for it but since she had surgery on her esophagus its been much better controlled.  Reports no shortness of breath nor dyspnea on exertion. Reports no chest pain, pressure, or tightness. No edema, orthopnea, PND. Reports no palpitations.   Dr. Parchment saw her February of last year after a fall, MRI, CT scan and [facility] neurologist appointment.  Dr. Maree ordered an EEG.  Husband thought that she was sleeping in her car but she passed out and does not remember what happened before or after.  Apparently snapped out of it around 1 PM.  Today,  she presents for preop clearance who presents for preoperative clearance for a percutaneous nerve stimulator procedure.  She is on aspirin  for coronary artery disease and has a history of two stents and coronary bypass grafting in 2000. Her last cardiac catheterization was in 2005, and a nuclear stress test in 2017 was unremarkable. An echocardiogram in 2022 showed normal heart pump function.  She has atrial fibrillation but no recent palpitations. She experienced a syncopal episode resulting in facial fractures and has ongoing right-sided weakness since a stroke. Imaging revealed a past stroke and brain bleed. She is undergoing therapy for balance and strength.  She uses a CPAP machine for sleep apnea, which she finds  effective. She can walk one to two blocks and manage household tasks without difficulty, though stairs are challenging due to a knee replacement. She enjoys swimming and plans to participate in water aerobics at her new assisted living facility.  Reports no shortness of breath nor dyspnea on exertion. Reports no chest pain, pressure, or tightness. No edema, orthopnea, PND. Reports no palpitations.   Discussed the use of AI scribe software for clinical note transcription with the patient, who gave verbal consent to proceed.   Past Medical History    Past Medical History:  Diagnosis Date   Angina    Arthritis    Atrial fibrillation (HCC)    ASPIRIN  FOR BLOOD THINNER   Atypical mole 09/22/2016   Bulging discs    lumbar    CHF (congestive heart failure) (HCC)    Complication of anesthesia    pt states has choking sensation with ET tube    Coronary artery disease    Depression    Diabetes mellitus    diet controlled/on meds   Family history of breast cancer    Family history of colon cancer    Fibromyalgia    GERD (gastroesophageal reflux disease)    H/O hiatal hernia    Headache(784.0)    recurring   High cholesterol    History of colon polyps    Hyperlipidemia    Hypertension    Jackhammer esophagus    Melanoma (HCC)    melanoma   Migraines    til ~ 1980   PONV (postoperative nausea and vomiting)    Restless leg syndrome    Sciatic nerve pain    from pinched nerve   Sleep apnea    uses CPAP   Stroke (HCC) 2014   no deficits   Weakness of right side of body    I've had PT for it; they don't know what it's from.  CORTISONE INJECTION INTO BACK 08/30/12   Past Surgical History:  Procedure Laterality Date   77 HOUR PH STUDY N/A 12/29/2015   Procedure: 24 HOUR PH STUDY;  Surgeon: Elspeth Deward Naval, MD;  Location: [facility];  Service: Gastroenterology;  Laterality: N/A;   CARPAL TUNNEL RELEASE Left 07/02/2015   Procedure: CARPAL TUNNEL RELEASE;  Surgeon:  Dempsey Sensor, MD;  Location: [facility];  Service: Orthopedics;  Laterality: Left;   CATARACT EXTRACTION, BILATERAL Bilateral    Oct and Nov 2017   COLONOSCOPY  06/30/2019   CORONARY ANGIOPLASTY WITH STENT PLACEMENT  06/2011   1   CORONARY ARTERY BYPASS GRAFT  2005   CABG X 2   DILATION AND CURETTAGE OF UTERUS     more than once   ELBOW ARTHROSCOPY Left 07/02/2015   Procedure: ARTHROSCOPY LEFT ELBOW WITH DEBRIDEMENT AND REMOVAL LOOSE BODY;  Surgeon: Dempsey Sensor, MD;  Location: [facility];  Service: Orthopedics;  Laterality: Left;   ESOPHAGEAL DILATION  01/21/2020   Procedure: ESOPHAGEAL DILATION;  Surgeon: San Sandor GAILS, DO;  Location: [facility];  Service: Gastroenterology;;   ESOPHAGEAL MANOMETRY N/A 12/29/2015   Procedure: ESOPHAGEAL MANOMETRY (EM);  Surgeon: Elspeth Deward Naval, MD;  Location: [facility];  Service: Gastroenterology;  Laterality: N/A;   ESOPHAGEAL MANOMETRY N/A 07/30/2019   Procedure: ESOPHAGEAL MANOMETRY (EM);  Surgeon: Naval Elspeth SQUIBB, MD;  Location: [facility];  Service: Gastroenterology;  Laterality: N/A;   ESOPHAGOGASTRODUODENOSCOPY (EGD) WITH PROPOFOL  N/A 01/21/2020   Procedure: ESOPHAGOGASTRODUODENOSCOPY (EGD) WITH PROPOFOL ;  Surgeon: San Sandor GAILS, DO;  Location: [facility];  Service: Gastroenterology;  Laterality: N/A;   FRACTURE SURGERY  ~ 2005   nose   KNEE ARTHROSCOPY  09/04/2012   Procedure: ARTHROSCOPY KNEE;  Surgeon: Dempsey GAILS Moan, MD;  Location: [facility];  Service: Orthopedics;  Laterality: Right;  right knee arthroscopy with medial and lateral meniscus debridement   MELANOMA EXCISION Right 10/13/2016   right side of neck   MOUTH SURGERY  2004   bone replacement; had cadavear bones put in; face was collapsing   NASAL SEPTUM SURGERY  ~ 1986   TOTAL KNEE ARTHROPLASTY Right 07/07/2013   Procedure: RIGHT TOTAL KNEE ARTHROPLASTY;  Surgeon: Dempsey GAILS Moan, MD;  Location: [facility];  Service: Orthopedics;   Laterality: Right;   TRANSORAL INCISIONLESS FUNDOPLICATION N/A 01/21/2020   Procedure: TRANSORAL INCISIONLESS FUNDOPLICATION;  Surgeon: San Sandor GAILS, DO;  Location: [facility];  Service: Gastroenterology;  Laterality: N/A;   UPPER GASTROINTESTINAL ENDOSCOPY      Allergies  Allergies  Allergen Reactions   Hydrocodone -Acetaminophen  Other (See Comments)    Changed personality made me very mean   Percocet [Oxycodone -Acetaminophen ] Other (See Comments)    hallucination   Sulfa Antibiotics Other (See Comments)    hallucinations   EKGs/Labs/Other Studies Reviewed:   The following studies were reviewed today:  Monitor 08/04/2021  Sinus rhythm 62 bpm on average Brief atrial tachycardia episodes Rare PVC/PAC's No pauses, no atrial fibrillation, no VT Reassuring    EKG:  EKG is not  ordered today.   Recent Labs: 02/19/2024: ALT 41 03/13/2024: BUN 33; Creatinine, Ser 1.03; Potassium 4.1; Sodium 139 06/11/2024: Hemoglobin 13.3; Platelet Count 275  Recent Lipid Panel    Component Value Date/Time   CHOL 110 02/19/2024 0841   CHOL 150 12/06/2022 0822   TRIG 114.0 02/19/2024 0841   HDL 42.90 02/19/2024 0841   HDL 52 12/06/2022 0822   CHOLHDL 3 02/19/2024 0841   VLDL 22.8 02/19/2024 0841   LDLCALC 44 02/19/2024 0841   LDLCALC 71 12/06/2022 0822   Home Medications   Current Meds  Medication Sig   albuterol  (VENTOLIN  HFA) 108 (90 Base) MCG/ACT inhaler Inhale 2 puffs into the lungs as needed for wheezing.   Alum & Mag Hydroxide-Simeth (MAALOX REGULAR STRENGTH PO) Take by mouth daily as needed. Patient takes 1 capful daily of the powder formulation.   aspirin  EC 81 MG tablet Take 1 tablet (81 mg total) by mouth daily.   atorvastatin  (LIPITOR ) 80 MG tablet Take 1 tablet (80 mg total) by mouth daily.   BLINK TEARS 0.25 % SOLN Place 1 drop into both eyes daily as needed (dry/irritated eyes.).   calcium -vitamin D  (OSCAL WITH D) 500-200 MG-UNIT tablet Take 1 tablet by mouth 2  (two) times daily.   chlorthalidone  (HYGROTON ) 25 MG tablet TAKE 1 TABLET BY MOUTH EVERY DAY   CINNAMON PO Take  1,000 mg by mouth in the morning and at bedtime.   empagliflozin  (JARDIANCE ) 10 MG TABS tablet Take 1 tablet (10 mg total) by mouth daily before breakfast.   ferrous sulfate 325 (65 FE) MG tablet Take 325 mg by mouth daily.   gabapentin  (NEURONTIN ) 300 MG capsule TAKE ONE CAPSULE IN THE MORNING, ONE CAPSULE IN THE EVENING AND 3 CAPSULES AT BEDTIME.   Lancet Device MISC Use to check blood sugar twice daily.   Lancets (FREESTYLE) lancets Use to check blood sugar twice daily   losartan  (COZAAR ) 50 MG tablet Take 1 tablet (50 mg total) by mouth daily.   metFORMIN  (GLUCOPHAGE -XR) 500 MG 24 hr tablet TAKE 2 TABLETS BY MOUTH TWICE A DAY   methylcellulose (CITRUCEL) oral powder Use as directed daily   metoprolol  succinate (TOPROL -XL) 100 MG 24 hr tablet TAKE 1 TABLET BY MOUTH EVERY DAY WITH OR IMMEDIATELY FOLLOWING A MEAL   mirabegron  ER (MYRBETRIQ ) 50 MG TB24 tablet Take 1 tablet (50 mg total) by mouth daily.   Multiple Vitamins-Minerals (PRESERVISION AREDS PO) Take 1 tablet by mouth 2 (two) times daily. Reported on 12/23/2015   nitroGLYCERIN  (NITROSTAT ) 0.4 MG SL tablet PLACE 1 TABLET (0.4 MG TOTAL) UNDER THE TONGUE EVERY 5 (FIVE) MINUTES AS NEEDED FOR CHEST PAIN   ONETOUCH ULTRA test strip USE TO CHECK BLOOD SUGAR TWICE DAILY   Probiotic Product (ALIGN PO) Take by mouth at bedtime.   QUEtiapine  (SEROQUEL ) 25 MG tablet TAKE 1 TABLET BY MOUTH EVERYDAY AT BEDTIME   rOPINIRole  (REQUIP ) 1 MG tablet 1/2 TABLET DURING THE DAY, 2 TABLETS AT BEDTIME   venlafaxine  XR (EFFEXOR -XR) 75 MG 24 hr capsule TAKE 1 CAPSULE BY MOUTH DAILY WITH BREAKFAST.   vitamin B-12 (CYANOCOBALAMIN ) 1000 MCG tablet Take 1,000 mcg by mouth daily.     Review of Systems      All other systems reviewed and are otherwise negative except as noted above.  Physical Exam    VS:  BP (!) 126/58   Pulse 66   Ht 5' 3 (1.6 m)    Wt 127 lb 3.2 oz (57.7 kg)   SpO2 97%   BMI 22.53 kg/m  , BMI Body mass index is 22.53 kg/m.  Wt Readings from Last 3 Encounters:  07/04/24 127 lb 3.2 oz (57.7 kg)  06/22/24 125 lb (56.7 kg)  06/13/24 127 lb 8 oz (57.8 kg)     GEN: Well nourished, well developed, in no acute distress. HEENT: normal. Neck: Supple, no JVD, carotid bruits, or masses. Cardiac: RRR, no murmurs, rubs, or gallops. No clubbing, cyanosis, edema.  Radials/PT 2+ and equal bilaterally.  Respiratory:  Respirations regular and unlabored, clear to auscultation bilaterally. GI: Soft, nontender, nondistended. MS: No deformity or atrophy. Skin: Warm and dry, no rash. Neuro:  Strength and sensation are intact. Psych: Normal affect.  Assessment & Plan    Preop Evaluation  6} Ms. Yglesias's perioperative risk of a major cardiac event is 11% according to the Revised Cardiac Risk Index (RCRI).  Therefore, she is at high risk for perioperative complications.   Her functional capacity is good at 5.99 METs according to the [facility] Activity Status Index (DASI). Recommendations: According to ACC/AHA guidelines, no further cardiovascular testing needed.  The patient may proceed to surgery at acceptable risk.   Antiplatelet and/or Anticoagulation Recommendations: Aspirin  can be held for 5 days prior to her surgery.  Please resume Aspirin  post operatively when it is felt to be safe from a bleeding standpoint.  Syncope -unusual episode, fell at home and broke every bone in her face -zio monitor was reviewed with her, no cardiac source was identified   CAD -No ischemia and normal heart pump function on nuclear stress test -she is s/p CABG and D1 stent  PAF -no Afib on monitor -she will continue to let us  know if she has palpiations   Diabetes with hypertension - Hemoglobin A1c is 7.9, encouraged to follow-up with primary.  This is down from 8.8 which was back in April - Continue to work on diet and  exercise  Disposition: Follow up 3 months  with Oneil Parchment, MD or APP.  Signed, Orren LOISE Fabry, PA-C 07/04/2024, 5:19 PM [facility]

## 2024-07-04 ENCOUNTER — Encounter: Payer: Self-pay | Admitting: Physician Assistant

## 2024-07-04 ENCOUNTER — Encounter: Payer: Self-pay | Admitting: Family Medicine

## 2024-07-04 ENCOUNTER — Ambulatory Visit: Attending: Physician Assistant | Admitting: Physician Assistant

## 2024-07-04 VITALS — BP 126/58 | HR 66 | Ht 63.0 in | Wt 127.2 lb

## 2024-07-04 DIAGNOSIS — R002 Palpitations: Secondary | ICD-10-CM | POA: Diagnosis not present

## 2024-07-04 DIAGNOSIS — G4733 Obstructive sleep apnea (adult) (pediatric): Secondary | ICD-10-CM | POA: Diagnosis not present

## 2024-07-04 DIAGNOSIS — I480 Paroxysmal atrial fibrillation: Secondary | ICD-10-CM

## 2024-07-04 DIAGNOSIS — I2510 Atherosclerotic heart disease of native coronary artery without angina pectoris: Secondary | ICD-10-CM | POA: Diagnosis not present

## 2024-07-04 DIAGNOSIS — R55 Syncope and collapse: Secondary | ICD-10-CM | POA: Diagnosis not present

## 2024-07-04 DIAGNOSIS — E785 Hyperlipidemia, unspecified: Secondary | ICD-10-CM | POA: Diagnosis not present

## 2024-07-04 DIAGNOSIS — I2583 Coronary atherosclerosis due to lipid rich plaque: Secondary | ICD-10-CM | POA: Diagnosis not present

## 2024-07-04 DIAGNOSIS — R5383 Other fatigue: Secondary | ICD-10-CM | POA: Diagnosis not present

## 2024-07-04 NOTE — Patient Instructions (Signed)
 Medication Instructions:  Your physician recommends that you continue on your current medications as directed. Please refer to the Current Medication list given to you today.  *If you need a refill on your cardiac medications before your next appointment, please call your pharmacy*  Lab Work: None ordered  If you have labs (blood work) drawn today and your tests are completely normal, you will receive your results only by: MyChart Message (if you have MyChart) OR A paper copy in the mail If you have any lab test that is abnormal or we need to change your treatment, we will call you to review the results.  Testing/Procedures: None ordered  Follow-Up: At [facility], you and your health needs are our priority.  As part of our continuing mission to provide you with exceptional heart care, our providers are all part of one team.  This team includes your primary Cardiologist (physician) and Advanced Practice Providers or APPs (Physician Assistants and Nurse Practitioners) who all work together to provide you with the care you need, when you need it.  Your next appointment:   As scheduled   Provider:   Dorothye Gathers, MD    We recommend signing up for the patient portal called MyChart.  Sign up information is provided on this After Visit Summary.  MyChart is used to connect with patients for Virtual Visits (Telemedicine).  Patients are able to view lab/test results, encounter notes, upcoming appointments, etc.  Non-urgent messages can be sent to your provider as well.   To learn more about what you can do with MyChart, go to [email or web address].   Other Instructions

## 2024-07-08 ENCOUNTER — Other Ambulatory Visit: Payer: Self-pay | Admitting: Family Medicine

## 2024-07-08 DIAGNOSIS — M47816 Spondylosis without myelopathy or radiculopathy, lumbar region: Secondary | ICD-10-CM | POA: Diagnosis not present

## 2024-07-08 DIAGNOSIS — M48062 Spinal stenosis, lumbar region with neurogenic claudication: Secondary | ICD-10-CM | POA: Diagnosis not present

## 2024-07-08 MED ORDER — PREDNISONE 20 MG PO TABS: ORAL_TABLET | ORAL | 0 refills | Status: AC

## 2024-07-08 NOTE — Telephone Encounter (Signed)
 Last office visit 06/13/24 for DM.  Last refilled 05/27/2024 for #150 with no refills.  Next Appt: 09/17/24 for DM.

## 2024-07-09 ENCOUNTER — Other Ambulatory Visit: Payer: Self-pay | Admitting: Family Medicine

## 2024-07-10 DIAGNOSIS — M47816 Spondylosis without myelopathy or radiculopathy, lumbar region: Secondary | ICD-10-CM | POA: Diagnosis not present

## 2024-07-10 DIAGNOSIS — M48062 Spinal stenosis, lumbar region with neurogenic claudication: Secondary | ICD-10-CM | POA: Diagnosis not present

## 2024-07-15 DIAGNOSIS — N3941 Urge incontinence: Secondary | ICD-10-CM | POA: Diagnosis not present

## 2024-07-16 ENCOUNTER — Telehealth: Payer: Self-pay

## 2024-07-16 NOTE — Patient Outreach (Signed)
 Complex Care Management Care Guide Note  07/16/2024 Name: Morgan [name] MRN: [id number] DOB: 01/08/1941  Morgan [name] is a 83 y.o. year old female who is a primary care patient of Bedsole, Amy E, MD and is actively engaged with the care management team. I reached out to [facility] by phone today to assist with re-scheduling  with the Licensed Clinical Child psychotherapist.  Follow up plan: Unsuccessful telephone outreach attempt made. A HIPAA compliant phone message was left for the patient providing contact information and requesting a return call.  Shereen Gin [facility]  Population Health VBCI Assistant Direct Dial: [phone]  Fax: [phone] Website: [email or web address]

## 2024-07-18 NOTE — Patient Outreach (Signed)
 Complex Care Management Care Guide Note  07/18/2024 Name: Morgan [name] MRN: [id number] DOB: 1941-08-24  Morgan [name] is a 83 y.o. year old female who is a primary care patient of Bedsole, Amy E, MD and is actively engaged with the care management team. I reached out to [facility] by phone today to assist with re-scheduling  with the Licensed Clinical Child psychotherapist.  Follow up plan: Spoke with husband and was informed that pt will call back to reschedule appt.  Shereen Gin [facility]  Population Health VBCI Assistant Direct Dial: [phone]  Fax: [phone] Website: [email or web address]

## 2024-07-22 ENCOUNTER — Encounter: Payer: Self-pay | Admitting: Cardiology

## 2024-07-22 ENCOUNTER — Ambulatory Visit: Attending: Cardiology | Admitting: Cardiology

## 2024-07-22 VITALS — BP 130/63 | HR 72 | Ht 63.0 in | Wt 123.0 lb

## 2024-07-22 DIAGNOSIS — M47816 Spondylosis without myelopathy or radiculopathy, lumbar region: Secondary | ICD-10-CM | POA: Diagnosis not present

## 2024-07-22 DIAGNOSIS — I2583 Coronary atherosclerosis due to lipid rich plaque: Secondary | ICD-10-CM | POA: Diagnosis not present

## 2024-07-22 DIAGNOSIS — I2510 Atherosclerotic heart disease of native coronary artery without angina pectoris: Secondary | ICD-10-CM

## 2024-07-22 DIAGNOSIS — I480 Paroxysmal atrial fibrillation: Secondary | ICD-10-CM

## 2024-07-22 DIAGNOSIS — M48062 Spinal stenosis, lumbar region with neurogenic claudication: Secondary | ICD-10-CM | POA: Diagnosis not present

## 2024-07-22 DIAGNOSIS — E1159 Type 2 diabetes mellitus with other circulatory complications: Secondary | ICD-10-CM | POA: Diagnosis not present

## 2024-07-22 NOTE — Progress Notes (Signed)
 Cardiology Office Note:  .   Date:  07/22/2024  ID:  Morgan [name], DOB 04-23-1941, MRN [id number] PCP: Avelina Greig BRAVO, MD  [facility] Providers Cardiologist:  Oneil Parchment, MD Sleep Medicine:  Wilbert Bihari, MD     History of Present Illness: .   Morgan [name] is a 83 y.o. female Discussed the use of AI scribe software for clinical note transcription with the patient, who gave verbal consent to proceed.  History of Present Illness Morgan [name] is an 83 year old female with coronary artery disease post CABG who presents for follow-up after a syncopal episode.  She experienced a syncopal episode at home, resulting in significant facial injuries described as 'broke every bone in her face.' A Xio monitor review showed no cardiac or electrical source for the syncope.  She has a history of coronary artery disease and underwent CABG in 2006, with a first diagonal stent placed post-CABG. She also has a history of cerebellar stroke in 2015 and prior melanoma. She has obstructive sleep apnea managed with CPAP. A prior monitor on August 04, 2021, showed sinus rhythm at 62 bpm, brief atrial tachycardia, and rare PVCs, but no atrial fibrillation.  She reports a challenging year, having moved in December to [facility] due to her husband's health needs. She experienced pneumonia, norovirus, another virus, and COVID-19 in January, leading to a month-long illness with persistent diarrhea.  She is currently on atorvastatin  80 mg, aspirin , losartan , Jardiance , and metoprolol . Her LDL was 44, and her creatinine was 1.0. She has been on prednisone  three times this year due to severe reactions to fire ant bites, which cause significant swelling and discomfort.  Socially, she moved to an apartment in [place] to avoid burdening her daughter, who is a Agricultural engineer.      Studies Reviewed: .        Results LABS A1c: 7.9 LDL: 44 Creatinine: 1.0  DIAGNOSTIC Xio monitor: Sinus  rhythm 62 bpm, brief atrial tachycardia, rare premature ventricular contractions (PVCs), no atrial fibrillation (08/04/2021) Risk Assessment/Calculations:            Physical Exam:   VS:  BP 130/63   Pulse 72   Ht 5' 3 (1.6 m)   Wt 123 lb (55.8 kg)   SpO2 95%   BMI 21.79 kg/m    Wt Readings from Last 3 Encounters:  07/22/24 123 lb (55.8 kg)  07/04/24 127 lb 3.2 oz (57.7 kg)  06/22/24 125 lb (56.7 kg)    GEN: Well nourished, well developed in no acute distress NECK: No JVD; No carotid bruits CARDIAC: RRR, no murmurs, no rubs, no gallops RESPIRATORY:  Clear to auscultation without rales, wheezing or rhonchi  ABDOMEN: Soft, non-tender, non-distended EXTREMITIES:  No edema; No deformity   ASSESSMENT AND PLAN: .    Assessment and Plan Assessment & Plan Coronary artery disease post CABG and stent Coronary artery disease post-CABG in 2006 and stent placement in the first diagonal branch. She is taking atorvastatin  and aspirin . - Continue atorvastatin  80 mg daily - Continue aspirin  as prescribed  Cardiac arrhythmias: brief atrial tachycardia and rare PVCs Cardiac arrhythmias with brief atrial tachycardia and rare PVCs. Monitoring showed sinus rhythm without atrial fibrillation. - Continue current management  Syncope with facial fractures (recent) Recent syncopal episode resulting in facial fractures. No cardiac or electrical source identified. She is receiving dental care for facial fractures. - Follow up with dental care for facial fractures  Type 2 diabetes mellitus Type 2  diabetes mellitus with a recent A1c of 7.9. Management includes Jardiance  and medication. - Continue Jardiance  as prescribed  Hypertension Hypertension managed with losartan  and metoprolol . Blood pressure was elevated during recent illness but is expected to stabilize. - Continue losartan  as prescribed - Continue metoprolol  as prescribed  Hyperlipidemia Hyperlipidemia managed with atorvastatin . Recent  LDL level was 44, indicating good control. - Continue atorvastatin  80 mg daily         Dispo: 1 yr  Signed, Oneil Parchment, MD

## 2024-07-22 NOTE — Patient Instructions (Signed)

## 2024-07-24 NOTE — Patient Outreach (Signed)
 Complex Care Management Care Guide Note  07/24/2024 Name: Verlean Allport MRN: [id number] DOB: 1941/08/04  Natane Heward is a 83 y.o. year old female who is a primary care patient of Bedsole, Amy E, MD and is actively engaged with the care management team. I reached out to [facility] by phone today to assist with re-scheduling  with the Licensed Clinical Child psychotherapist.  Follow up plan: 3rd Unsuccessful telephone outreach attempt made. A HIPAA compliant phone message was left for the patient providing contact information and requesting a return call. Not further outreach attempts will be made at this time.  Shereen Gin [facility]  Population Health VBCI Assistant Direct Dial: [phone]  Fax: [phone] Website: [email or web address]

## 2024-07-25 ENCOUNTER — Other Ambulatory Visit: Payer: Self-pay | Admitting: Cardiology

## 2024-07-28 DIAGNOSIS — N3281 Overactive bladder: Secondary | ICD-10-CM | POA: Diagnosis not present

## 2024-07-29 DIAGNOSIS — M48062 Spinal stenosis, lumbar region with neurogenic claudication: Secondary | ICD-10-CM | POA: Diagnosis not present

## 2024-08-04 ENCOUNTER — Ambulatory Visit: Admitting: Cardiology

## 2024-08-05 ENCOUNTER — Other Ambulatory Visit: Payer: Self-pay | Admitting: Family Medicine

## 2024-08-05 DIAGNOSIS — M47816 Spondylosis without myelopathy or radiculopathy, lumbar region: Secondary | ICD-10-CM | POA: Diagnosis not present

## 2024-08-05 DIAGNOSIS — M48062 Spinal stenosis, lumbar region with neurogenic claudication: Secondary | ICD-10-CM | POA: Diagnosis not present

## 2024-08-13 DIAGNOSIS — M48062 Spinal stenosis, lumbar region with neurogenic claudication: Secondary | ICD-10-CM | POA: Diagnosis not present

## 2024-08-13 DIAGNOSIS — M47816 Spondylosis without myelopathy or radiculopathy, lumbar region: Secondary | ICD-10-CM | POA: Diagnosis not present

## 2024-08-15 DIAGNOSIS — M48062 Spinal stenosis, lumbar region with neurogenic claudication: Secondary | ICD-10-CM | POA: Diagnosis not present

## 2024-08-16 ENCOUNTER — Emergency Department

## 2024-08-16 ENCOUNTER — Other Ambulatory Visit: Payer: Self-pay

## 2024-08-16 ENCOUNTER — Emergency Department
Admission: EM | Admit: 2024-08-16 | Discharge: 2024-08-16 | Disposition: A | Discharge status: Home / Self Care | Source: Ambulatory Visit

## 2024-08-16 DIAGNOSIS — S022XXA Fracture of nasal bones, initial encounter for closed fracture: Secondary | ICD-10-CM | POA: Insufficient documentation

## 2024-08-16 DIAGNOSIS — Z7982 Long term (current) use of aspirin: Secondary | ICD-10-CM | POA: Diagnosis not present

## 2024-08-16 DIAGNOSIS — I10 Essential (primary) hypertension: Secondary | ICD-10-CM | POA: Insufficient documentation

## 2024-08-16 DIAGNOSIS — Z043 Encounter for examination and observation following other accident: Secondary | ICD-10-CM | POA: Diagnosis not present

## 2024-08-16 DIAGNOSIS — W19XXXA Unspecified fall, initial encounter: Secondary | ICD-10-CM

## 2024-08-16 DIAGNOSIS — S6991XA Unspecified injury of right wrist, hand and finger(s), initial encounter: Secondary | ICD-10-CM | POA: Diagnosis not present

## 2024-08-16 DIAGNOSIS — M4802 Spinal stenosis, cervical region: Secondary | ICD-10-CM | POA: Diagnosis not present

## 2024-08-16 DIAGNOSIS — I2510 Atherosclerotic heart disease of native coronary artery without angina pectoris: Secondary | ICD-10-CM | POA: Diagnosis not present

## 2024-08-16 DIAGNOSIS — S0992XA Unspecified injury of nose, initial encounter: Secondary | ICD-10-CM | POA: Diagnosis present

## 2024-08-16 DIAGNOSIS — S0291XA Unspecified fracture of skull, initial encounter for closed fracture: Secondary | ICD-10-CM | POA: Diagnosis not present

## 2024-08-16 DIAGNOSIS — S0083XA Contusion of other part of head, initial encounter: Secondary | ICD-10-CM | POA: Insufficient documentation

## 2024-08-16 DIAGNOSIS — W01198A Fall on same level from slipping, tripping and stumbling with subsequent striking against other object, initial encounter: Secondary | ICD-10-CM | POA: Insufficient documentation

## 2024-08-16 DIAGNOSIS — E119 Type 2 diabetes mellitus without complications: Secondary | ICD-10-CM | POA: Insufficient documentation

## 2024-08-16 DIAGNOSIS — R220 Localized swelling, mass and lump, head: Secondary | ICD-10-CM | POA: Diagnosis not present

## 2024-08-16 DIAGNOSIS — M47812 Spondylosis without myelopathy or radiculopathy, cervical region: Secondary | ICD-10-CM | POA: Diagnosis not present

## 2024-08-16 DIAGNOSIS — G9389 Other specified disorders of brain: Secondary | ICD-10-CM | POA: Diagnosis not present

## 2024-08-16 MED ORDER — ACETAMINOPHEN 500 MG PO TABS: 1000.0000 mg | ORAL_TABLET | Freq: Four times a day (QID) | ORAL | 2 refills | Status: AC | PRN

## 2024-08-16 MED ORDER — ACETAMINOPHEN 500 MG PO TABS
1000.0000 mg | ORAL_TABLET | Freq: Once | ORAL | Status: AC
  Administered 2024-08-16: 1000 mg via ORAL
  Filled 2024-08-16: qty 2

## 2024-08-16 NOTE — Discharge Instructions (Signed)
 Your evaluation in the emergency department was notable for a nasal fracture.  Fortunately we saw no other traumatic injuries from your fall.  You can use Tylenol  as needed for any ongoing discomfort.  Please follow-up with your primary care provider for reevaluation, and return to the emergency department with any new or worsening symptoms.

## 2024-08-16 NOTE — ED Triage Notes (Signed)
 Pt to ED from [facility]. Pt states she walking between a car and curb and lost her balance and fell. Pt hit her face, denies LOC. Pt takes baby aspirin  daily. Pt states headache.

## 2024-08-16 NOTE — ED Provider Notes (Signed)
 [facility] Provider Note    Event Date/Time   First MD Initiated Contact with Patient 08/16/24 1247     (approximate)   History   Fall  Pt to ED from [facility]. Pt states she walking between a car and curb and lost her balance and fell. Pt hit her face, denies LOC. Pt takes baby aspirin  daily. Pt states headache.   HPI Morgan [name] is a 83 y.o. female PMH paroxysmal A-fib not on anticoagulation, CAD, hypertension, diabetes, fibromyalgia presents for evaluation after fall - Patient was walking next to the curb near her car, she stepped down from the curb she lost her balance and fell.  Had been feeling well prior to this, no preceding dizziness, infectious symptoms, urinary symptoms, palpitations, chest pain.  Is otherwise been in her usual state of health. - Event occurred around 11 AM.  No LOC.  Not on blood thinners though does take aspirin .  No other recent falls.  No subsequent vomiting or altered mentation.  Collateral gathered from husband at bedside who corroborates history. [facility] to clinic, sent to ED for imaging - Ambulatory after event without difficulty       Physical Exam   Triage Vital Signs: ED Triage Vitals  Encounter Vitals Group     BP 08/16/24 1223 120/77     Girls Systolic BP Percentile --      Girls Diastolic BP Percentile --      Boys Systolic BP Percentile --      Boys Diastolic BP Percentile --      Pulse Rate 08/16/24 1223 61     Resp 08/16/24 1223 17     Temp 08/16/24 1223 98.1 F (36.7 C)     Temp Source 08/16/24 1223 Oral     SpO2 08/16/24 1223 96 %     Weight 08/16/24 1225 128 lb (58.1 kg)     Height 08/16/24 1225 5' 3 (1.6 m)     Head Circumference --      Peak Flow --      Pain Score 08/16/24 1230 6     Pain Loc --      Pain Education --      Exclude from Growth Chart --     Most recent vital signs: Vitals:   08/16/24 1223  BP: 120/77  Pulse: 61  Resp: 17  Temp: 98.1 F (36.7 C)  SpO2: 96%      General: Awake, no distress.  HEENT:  Abrasions to nasal bridge with possible mild deformity of nasal bridge, no septal hematoma appreciated.  Small hematoma to forehead and some through right temporoparietal region.  No frank lacerations or skin avulsions.  No intraoral trauma appreciated.  Jaw aligned.  Some tenderness to palpation over bilateral maxillary region.  No evidence of ocular trauma. CV:  Good peripheral perfusion. RRR, RP 2+ Resp:  Normal effort. CTAB Abd:  No distention. Nontender to deep palpation throughout Other:  Full range of motion of all joints in bilateral upper and lower extremities.  Mild bruising and tenderness to palpation over right fifth distal metacarpal.  No deformity appreciated.  No open wounds.   ED Results / Procedures / Treatments   Labs (all labs ordered are listed, but only abnormal results are displayed) Labs Reviewed - No data to display   EKG  N/a   RADIOLOGY Radiology interpreted by myself and radiology report reviewed.  Notable for right nasal fracture.    PROCEDURES:  Critical Care performed:  No  Procedures   MEDICATIONS ORDERED IN ED: Medications  acetaminophen  (TYLENOL ) tablet 1,000 mg (1,000 mg Oral Given 08/16/24 1316)     IMPRESSION / MDM / ASSESSMENT AND PLAN / ED COURSE  I reviewed the triage vital signs and the nursing notes.                              DDX/MDM/AP: Differential diagnosis includes, but is not limited to, apparent mechanical fall.  Consider possibility of facial fracture, intracranial hemorrhage or skull fracture, C-spine fracture.  Also consider right hand fracture.  No evidence of other traumatic injuries.  Patient is been in her usual state of health and otherwise asymptomatic, doubt significant underlying organic contributing factors such as anemia, arrhythmia, electrolyte abnormality --offered screening labs as patient declined and I believe is reasonable.  Plan: - CTH, CTCspine, CTMF - XR  R hand - tylenol  - npo  Patient's presentation is most consistent with acute presentation with potential threat to life or bodily function.  The patient is on the cardiac monitor to evaluate for evidence of arrhythmia and/or significant heart rate changes.  ED course below.  Notable for closed right nasal fracture.  No septal hematoma.  No other traumatic injuries identified.  Patient feels very well and is amenable to outpatient follow-up with PMD.  ED return precautions in place.  Patient and family agree with plan.  Rx Tylenol .  Clinical Course as of 08/16/24 1419  Sat Aug 16, 2024  1333 XR right hand reviewed, no acute pathology my interpretation, radiology in agreement: IMPRESSION: No acute fracture or dislocation.   [MM]  1404 CT face/head interpreted by myself and radiology report reviewed.  Right nasal bone fracture.  IMPRESSION: 1. Generalized cerebral atrophy and microvascular disease changes of the supratentorial brain. 2. Mildly displaced right-sided nasal bone fracture. 3. Mild right frontal scalp soft tissue swelling.   [MM]  1417 CTCSpine: IMPRESSION: 1. No acute fracture or subluxation in the cervical spine. 2. Moderate to marked severity multilevel degenerative changes, as described above.   [MM]    Clinical Course User Index [MM] Clarine Ozell LABOR, MD     FINAL CLINICAL IMPRESSION(S) / ED DIAGNOSES   Final diagnoses:  Fall, initial encounter  Contusion of face, initial encounter  Closed fracture of nasal bone, initial encounter     Rx / DC Orders   ED Discharge Orders          Ordered    acetaminophen  (TYLENOL ) 500 MG tablet  Every 6 hours PRN        08/16/24 1419             Note:  This document was prepared using Dragon voice recognition software and may include unintentional dictation errors.   Clarine Ozell LABOR, MD 08/16/24 [phone]

## 2024-08-16 NOTE — ED Notes (Signed)
 Patient taken to CT scan.

## 2024-08-19 DIAGNOSIS — M48062 Spinal stenosis, lumbar region with neurogenic claudication: Secondary | ICD-10-CM | POA: Diagnosis not present

## 2024-08-22 DIAGNOSIS — N3941 Urge incontinence: Secondary | ICD-10-CM | POA: Diagnosis not present

## 2024-08-26 DIAGNOSIS — M47816 Spondylosis without myelopathy or radiculopathy, lumbar region: Secondary | ICD-10-CM | POA: Diagnosis not present

## 2024-08-28 DIAGNOSIS — N3946 Mixed incontinence: Secondary | ICD-10-CM | POA: Diagnosis not present

## 2024-08-28 DIAGNOSIS — M47816 Spondylosis without myelopathy or radiculopathy, lumbar region: Secondary | ICD-10-CM | POA: Diagnosis not present

## 2024-08-28 DIAGNOSIS — M48062 Spinal stenosis, lumbar region with neurogenic claudication: Secondary | ICD-10-CM | POA: Diagnosis not present

## 2024-09-02 ENCOUNTER — Telehealth: Payer: Self-pay | Admitting: *Deleted

## 2024-09-02 DIAGNOSIS — E1159 Type 2 diabetes mellitus with other circulatory complications: Secondary | ICD-10-CM

## 2024-09-02 DIAGNOSIS — E1169 Type 2 diabetes mellitus with other specified complication: Secondary | ICD-10-CM

## 2024-09-02 DIAGNOSIS — N3281 Overactive bladder: Secondary | ICD-10-CM | POA: Diagnosis not present

## 2024-09-02 NOTE — Telephone Encounter (Signed)
-----   Message from Harlene Du sent at 09/02/2024  2:26 PM EDT ----- Regarding: Lab Wed 09/10/24 Hello,   Patient has a lab appointment on Wednesday 09/10/24 but do not see what they could be for. Can we get lab orders please.   Thanks

## 2024-09-09 DIAGNOSIS — M48062 Spinal stenosis, lumbar region with neurogenic claudication: Secondary | ICD-10-CM | POA: Diagnosis not present

## 2024-09-10 ENCOUNTER — Other Ambulatory Visit

## 2024-09-10 ENCOUNTER — Ambulatory Visit: Payer: Self-pay | Admitting: Family Medicine

## 2024-09-10 DIAGNOSIS — E1169 Type 2 diabetes mellitus with other specified complication: Secondary | ICD-10-CM | POA: Diagnosis not present

## 2024-09-10 DIAGNOSIS — E1159 Type 2 diabetes mellitus with other circulatory complications: Secondary | ICD-10-CM | POA: Diagnosis not present

## 2024-09-10 DIAGNOSIS — E785 Hyperlipidemia, unspecified: Secondary | ICD-10-CM | POA: Diagnosis not present

## 2024-09-10 LAB — LIPID PANEL
Cholesterol: 129 mg/dL (ref 0–200)
HDL: 51.5 mg/dL (ref >39.00)
LDL Cholesterol: 60 mg/dL (ref 0–99)
NonHDL: 77.64
Total CHOL/HDL Ratio: 3
Triglycerides: 87 mg/dL (ref 0.0–149.0)
VLDL: 17.4 mg/dL (ref 0.0–40.0)

## 2024-09-10 LAB — COMPREHENSIVE METABOLIC PANEL WITH GFR
ALT: 32 U/L (ref 0–35)
AST: 20 U/L (ref 0–37)
Albumin: 4.4 g/dL (ref 3.5–5.2)
Alkaline Phosphatase: 91 U/L (ref 39–117)
BUN: 42 mg/dL — ABNORMAL HIGH (ref 6–23)
CO2: 33 meq/L — ABNORMAL HIGH (ref 19–32)
Calcium: 10.1 mg/dL (ref 8.4–10.5)
Chloride: 99 meq/L (ref 96–112)
Creatinine, Ser: 1.23 mg/dL — ABNORMAL HIGH (ref 0.40–1.20)
GFR: 40.94 mL/min — ABNORMAL LOW (ref >60.00)
Glucose, Bld: 196 mg/dL — ABNORMAL HIGH (ref 70–99)
Potassium: 4.3 meq/L (ref 3.5–5.1)
Sodium: 141 meq/L (ref 135–145)
Total Bilirubin: 0.5 mg/dL (ref 0.2–1.2)
Total Protein: 6.9 g/dL (ref 6.0–8.3)

## 2024-09-10 LAB — HEMOGLOBIN A1C: Hgb A1c MFr Bld: 9.7 % — ABNORMAL HIGH (ref 4.6–6.5)

## 2024-09-10 NOTE — Progress Notes (Signed)
 No critical labs need to be addressed urgently. We will discuss labs in detail at upcoming office visit.

## 2024-09-12 ENCOUNTER — Inpatient Hospital Stay

## 2024-09-12 ENCOUNTER — Telehealth: Payer: Self-pay | Admitting: Oncology

## 2024-09-12 NOTE — Telephone Encounter (Signed)
 Pt was scheduled for labs today 10/31 and no showed appt.   Pt was scheduled for MD/iron  on 11/3.   Per RN since no showed labs prior, please r/s all appts.   I called and spoke with pt spouse and confirmed the cancellation of Monday appts and that pt can call back to r/s all appts. Scheduling phone number provided

## 2024-09-15 ENCOUNTER — Inpatient Hospital Stay

## 2024-09-15 ENCOUNTER — Inpatient Hospital Stay: Admitting: Oncology

## 2024-09-17 ENCOUNTER — Ambulatory Visit: Admitting: Family Medicine

## 2024-10-01 ENCOUNTER — Other Ambulatory Visit: Payer: Self-pay | Admitting: Family Medicine

## 2024-10-01 DIAGNOSIS — I152 Hypertension secondary to endocrine disorders: Secondary | ICD-10-CM

## 2024-11-24 ENCOUNTER — Encounter: Payer: Self-pay | Admitting: *Deleted

## 2024-11-26 ENCOUNTER — Telehealth: Payer: Self-pay | Admitting: Oncology

## 2024-11-26 NOTE — Telephone Encounter (Signed)
 Ok to r/s missed appt from Nov, next avail.

## 2024-11-26 NOTE — Telephone Encounter (Signed)
 Pt called and would like to schedule an appt with Dr.Yu. pt was last seen in July 2025. Please advise. Phone numbers for pt have been confirmed and pt would like home phone to be called first.

## 2024-12-15 ENCOUNTER — Ambulatory Visit: Admitting: Obstetrics and Gynecology

## 2025-01-08 ENCOUNTER — Inpatient Hospital Stay

## 2025-01-15 ENCOUNTER — Inpatient Hospital Stay

## 2025-01-15 ENCOUNTER — Inpatient Hospital Stay: Admitting: Oncology

## 2025-03-04 ENCOUNTER — Ambulatory Visit
